# Patient Record
Sex: Female | Born: 1975
Health system: Southern US, Community
[De-identification: ages and names within clinical notes are randomized; demographics above are authoritative.]

## PROBLEM LIST (undated history)

## (undated) DIAGNOSIS — J302 Other seasonal allergic rhinitis: Secondary | ICD-10-CM

## (undated) DIAGNOSIS — Z8614 Personal history of Methicillin resistant Staphylococcus aureus infection: Secondary | ICD-10-CM

## (undated) DIAGNOSIS — C259 Malignant neoplasm of pancreas, unspecified: Secondary | ICD-10-CM

## (undated) DIAGNOSIS — K219 Gastro-esophageal reflux disease without esophagitis: Secondary | ICD-10-CM

## (undated) DIAGNOSIS — F329 Major depressive disorder, single episode, unspecified: Secondary | ICD-10-CM

## (undated) DIAGNOSIS — E119 Type 2 diabetes mellitus without complications: Secondary | ICD-10-CM

## (undated) DIAGNOSIS — F32A Depression, unspecified: Secondary | ICD-10-CM

## (undated) DIAGNOSIS — M1990 Unspecified osteoarthritis, unspecified site: Secondary | ICD-10-CM

## (undated) DIAGNOSIS — C541 Malignant neoplasm of endometrium: Secondary | ICD-10-CM

## (undated) DIAGNOSIS — N200 Calculus of kidney: Secondary | ICD-10-CM

## (undated) DIAGNOSIS — M069 Rheumatoid arthritis, unspecified: Secondary | ICD-10-CM

## (undated) DIAGNOSIS — C180 Malignant neoplasm of cecum: Secondary | ICD-10-CM

## (undated) DIAGNOSIS — C561 Malignant neoplasm of right ovary: Secondary | ICD-10-CM

## (undated) HISTORY — DX: Malignant neoplasm of pancreas, unspecified: C25.9

## (undated) HISTORY — DX: Malignant neoplasm of right ovary: C56.1

## (undated) HISTORY — DX: Malignant neoplasm of cecum: C18.0

## (undated) HISTORY — PX: EYE SURGERY: SHX253

## (undated) HISTORY — DX: Malignant neoplasm of endometrium: C54.1

## (undated) HISTORY — PX: KNEE ARTHROSCOPY: SUR90

---
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
## 1997-06-10 ENCOUNTER — Other Ambulatory Visit: Admission: RE | Admit: 1997-06-10 | Discharge: 1997-06-10 | Payer: Self-pay | Admitting: Obstetrics and Gynecology

## 1998-09-22 ENCOUNTER — Inpatient Hospital Stay (HOSPITAL_COMMUNITY): Admission: AD | Admit: 1998-09-22 | Discharge: 1998-09-24 | Payer: Self-pay | Admitting: Obstetrics and Gynecology

## 1998-10-22 ENCOUNTER — Other Ambulatory Visit: Admission: RE | Admit: 1998-10-22 | Discharge: 1998-10-22 | Payer: Self-pay | Admitting: Family Medicine

## 1999-02-23 DIAGNOSIS — C189 Malignant neoplasm of colon, unspecified: Secondary | ICD-10-CM | POA: Insufficient documentation

## 1999-02-23 DIAGNOSIS — C180 Malignant neoplasm of cecum: Secondary | ICD-10-CM

## 1999-02-23 HISTORY — DX: Malignant neoplasm of cecum: C18.0

## 1999-02-23 HISTORY — PX: PORT A CATH REVISION: SHX6033

## 1999-05-29 ENCOUNTER — Encounter: Payer: Self-pay | Admitting: Gastroenterology

## 1999-05-29 ENCOUNTER — Ambulatory Visit (HOSPITAL_COMMUNITY): Admission: RE | Admit: 1999-05-29 | Discharge: 1999-05-29 | Payer: Self-pay | Admitting: Gastroenterology

## 1999-05-29 ENCOUNTER — Encounter (HOSPITAL_COMMUNITY): Admission: RE | Admit: 1999-05-29 | Discharge: 1999-08-27 | Payer: Self-pay | Admitting: Gastroenterology

## 1999-05-29 ENCOUNTER — Encounter (INDEPENDENT_AMBULATORY_CARE_PROVIDER_SITE_OTHER): Payer: Self-pay | Admitting: Specialist

## 1999-06-03 ENCOUNTER — Inpatient Hospital Stay (HOSPITAL_COMMUNITY): Admission: RE | Admit: 1999-06-03 | Discharge: 1999-06-10 | Payer: Self-pay | Admitting: Surgery

## 1999-08-17 ENCOUNTER — Ambulatory Visit (HOSPITAL_BASED_OUTPATIENT_CLINIC_OR_DEPARTMENT_OTHER): Admission: RE | Admit: 1999-08-17 | Discharge: 1999-08-17 | Payer: Self-pay | Admitting: Surgery

## 1999-08-17 ENCOUNTER — Encounter: Payer: Self-pay | Admitting: Surgery

## 1999-09-14 ENCOUNTER — Encounter: Payer: Self-pay | Admitting: Oncology

## 1999-09-14 ENCOUNTER — Encounter: Admission: RE | Admit: 1999-09-14 | Discharge: 1999-09-14 | Payer: Self-pay | Admitting: Oncology

## 1999-09-15 ENCOUNTER — Inpatient Hospital Stay (HOSPITAL_COMMUNITY): Admission: AD | Admit: 1999-09-15 | Discharge: 1999-09-18 | Payer: Self-pay | Admitting: Oncology

## 1999-09-28 ENCOUNTER — Other Ambulatory Visit: Admission: RE | Admit: 1999-09-28 | Discharge: 1999-09-28 | Payer: Self-pay | Admitting: Obstetrics and Gynecology

## 2000-01-21 ENCOUNTER — Ambulatory Visit (HOSPITAL_BASED_OUTPATIENT_CLINIC_OR_DEPARTMENT_OTHER): Admission: RE | Admit: 2000-01-21 | Discharge: 2000-01-21 | Payer: Self-pay | Admitting: Surgery

## 2000-06-06 ENCOUNTER — Ambulatory Visit (HOSPITAL_COMMUNITY): Admission: RE | Admit: 2000-06-06 | Discharge: 2000-06-06 | Payer: Self-pay | Admitting: Gastroenterology

## 2001-01-16 ENCOUNTER — Ambulatory Visit (HOSPITAL_COMMUNITY): Admission: RE | Admit: 2001-01-16 | Discharge: 2001-01-16 | Payer: Self-pay | Admitting: Oncology

## 2001-01-16 ENCOUNTER — Encounter: Payer: Self-pay | Admitting: Oncology

## 2001-08-21 ENCOUNTER — Other Ambulatory Visit: Admission: RE | Admit: 2001-08-21 | Discharge: 2001-08-21 | Payer: Self-pay | Admitting: Obstetrics and Gynecology

## 2001-09-18 ENCOUNTER — Ambulatory Visit (HOSPITAL_COMMUNITY): Admission: RE | Admit: 2001-09-18 | Discharge: 2001-09-18 | Payer: Self-pay | Admitting: Oncology

## 2001-09-18 ENCOUNTER — Encounter: Payer: Self-pay | Admitting: Oncology

## 2002-04-02 ENCOUNTER — Ambulatory Visit (HOSPITAL_COMMUNITY): Admission: RE | Admit: 2002-04-02 | Discharge: 2002-04-02 | Payer: Self-pay | Admitting: Oncology

## 2002-04-02 ENCOUNTER — Encounter: Payer: Self-pay | Admitting: Oncology

## 2002-05-28 ENCOUNTER — Ambulatory Visit (HOSPITAL_COMMUNITY): Admission: RE | Admit: 2002-05-28 | Discharge: 2002-05-28 | Payer: Self-pay | Admitting: Gastroenterology

## 2002-05-28 ENCOUNTER — Encounter (INDEPENDENT_AMBULATORY_CARE_PROVIDER_SITE_OTHER): Payer: Self-pay

## 2002-07-02 ENCOUNTER — Ambulatory Visit (HOSPITAL_COMMUNITY): Admission: RE | Admit: 2002-07-02 | Discharge: 2002-07-02 | Payer: Self-pay | Admitting: Oncology

## 2002-07-02 ENCOUNTER — Encounter: Payer: Self-pay | Admitting: Oncology

## 2002-09-03 ENCOUNTER — Other Ambulatory Visit: Admission: RE | Admit: 2002-09-03 | Discharge: 2002-09-03 | Payer: Self-pay | Admitting: Obstetrics and Gynecology

## 2002-09-25 ENCOUNTER — Encounter: Payer: Self-pay | Admitting: Oncology

## 2002-09-25 ENCOUNTER — Ambulatory Visit (HOSPITAL_COMMUNITY): Admission: RE | Admit: 2002-09-25 | Discharge: 2002-09-25 | Payer: Self-pay | Admitting: Oncology

## 2003-07-08 ENCOUNTER — Ambulatory Visit (HOSPITAL_COMMUNITY): Admission: RE | Admit: 2003-07-08 | Discharge: 2003-07-08 | Payer: Self-pay | Admitting: Oncology

## 2003-09-23 ENCOUNTER — Other Ambulatory Visit: Admission: RE | Admit: 2003-09-23 | Discharge: 2003-09-23 | Payer: Self-pay | Admitting: Obstetrics and Gynecology

## 2003-12-29 ENCOUNTER — Ambulatory Visit: Payer: Self-pay | Admitting: Oncology

## 2004-01-13 ENCOUNTER — Ambulatory Visit (HOSPITAL_COMMUNITY): Admission: RE | Admit: 2004-01-13 | Discharge: 2004-01-13 | Payer: Self-pay | Admitting: Oncology

## 2004-01-15 ENCOUNTER — Encounter: Admission: RE | Admit: 2004-01-15 | Discharge: 2004-01-15 | Payer: Self-pay | Admitting: Rheumatology

## 2004-01-31 ENCOUNTER — Ambulatory Visit (HOSPITAL_COMMUNITY): Admission: RE | Admit: 2004-01-31 | Discharge: 2004-01-31 | Payer: Self-pay | Admitting: Oncology

## 2004-06-26 ENCOUNTER — Ambulatory Visit: Payer: Self-pay | Admitting: Oncology

## 2004-06-29 ENCOUNTER — Ambulatory Visit (HOSPITAL_COMMUNITY): Admission: RE | Admit: 2004-06-29 | Discharge: 2004-06-29 | Payer: Self-pay | Admitting: Oncology

## 2004-08-31 ENCOUNTER — Ambulatory Visit (HOSPITAL_COMMUNITY): Admission: RE | Admit: 2004-08-31 | Discharge: 2004-08-31 | Payer: Self-pay | Admitting: Gastroenterology

## 2004-09-04 ENCOUNTER — Ambulatory Visit: Payer: Self-pay | Admitting: Oncology

## 2004-10-12 ENCOUNTER — Other Ambulatory Visit: Admission: RE | Admit: 2004-10-12 | Discharge: 2004-10-12 | Payer: Self-pay | Admitting: Obstetrics and Gynecology

## 2005-01-04 ENCOUNTER — Encounter: Admission: RE | Admit: 2005-01-04 | Discharge: 2005-01-04 | Payer: Self-pay | Admitting: Rheumatology

## 2005-06-07 ENCOUNTER — Encounter: Admission: RE | Admit: 2005-06-07 | Discharge: 2005-06-07 | Payer: Self-pay | Admitting: Rheumatology

## 2005-08-30 ENCOUNTER — Ambulatory Visit (HOSPITAL_COMMUNITY): Admission: RE | Admit: 2005-08-30 | Discharge: 2005-08-30 | Payer: Self-pay | Admitting: Oncology

## 2005-09-08 ENCOUNTER — Ambulatory Visit: Payer: Self-pay | Admitting: Oncology

## 2006-01-19 ENCOUNTER — Ambulatory Visit: Payer: Self-pay | Admitting: Oncology

## 2006-01-20 LAB — FECAL OCCULT BLOOD, GUAIAC: Occult Blood: NEGATIVE

## 2007-01-11 ENCOUNTER — Ambulatory Visit: Payer: Self-pay | Admitting: Oncology

## 2007-01-13 LAB — COMPREHENSIVE METABOLIC PANEL
ALT: 10 U/L (ref 0–35)
AST: 9 U/L (ref 0–37)
Albumin: 3.9 g/dL (ref 3.5–5.2)
Alkaline Phosphatase: 49 U/L (ref 39–117)
BUN: 12 mg/dL (ref 6–23)
CO2: 24 mEq/L (ref 19–32)
Calcium: 9 mg/dL (ref 8.4–10.5)
Chloride: 106 mEq/L (ref 96–112)
Creatinine, Ser: 0.67 mg/dL (ref 0.40–1.20)
Glucose, Bld: 97 mg/dL (ref 70–99)
Potassium: 4.1 mEq/L (ref 3.5–5.3)
Sodium: 138 mEq/L (ref 135–145)
Total Bilirubin: 0.2 mg/dL — ABNORMAL LOW (ref 0.3–1.2)
Total Protein: 6.7 g/dL (ref 6.0–8.3)

## 2007-01-13 LAB — CBC WITH DIFFERENTIAL/PLATELET
BASO%: 0.2 % (ref 0.0–2.0)
Basophils Absolute: 0 10*3/uL (ref 0.0–0.1)
EOS%: 1.3 % (ref 0.0–7.0)
Eosinophils Absolute: 0.1 10*3/uL (ref 0.0–0.5)
HCT: 37.1 % (ref 34.8–46.6)
HGB: 12.9 g/dL (ref 11.6–15.9)
LYMPH%: 15.7 % (ref 14.0–48.0)
MCH: 30.8 pg (ref 26.0–34.0)
MCHC: 34.8 g/dL (ref 32.0–36.0)
MCV: 88.7 fL (ref 81.0–101.0)
MONO#: 0.2 10*3/uL (ref 0.1–0.9)
MONO%: 2.7 % (ref 0.0–13.0)
NEUT#: 4.7 10*3/uL (ref 1.5–6.5)
NEUT%: 80.1 % — ABNORMAL HIGH (ref 39.6–76.8)
Platelets: 256 10*3/uL (ref 145–400)
RBC: 4.18 10*6/uL (ref 3.70–5.32)
RDW: 14.4 % (ref 11.3–14.5)
WBC: 5.8 10*3/uL (ref 3.9–10.0)
lymph#: 0.9 10*3/uL (ref 0.9–3.3)

## 2007-01-13 LAB — LACTATE DEHYDROGENASE: LDH: 141 U/L (ref 94–250)

## 2007-01-13 LAB — CEA: CEA: <0.5 ng/mL (ref 0.0–5.0)

## 2007-01-16 ENCOUNTER — Ambulatory Visit (HOSPITAL_COMMUNITY): Admission: RE | Admit: 2007-01-16 | Discharge: 2007-01-16 | Payer: Self-pay | Admitting: Oncology

## 2007-02-20 ENCOUNTER — Ambulatory Visit (HOSPITAL_COMMUNITY): Admission: RE | Admit: 2007-02-20 | Discharge: 2007-02-20 | Payer: Self-pay | Admitting: Oncology

## 2008-01-16 ENCOUNTER — Ambulatory Visit: Payer: Self-pay | Admitting: Oncology

## 2009-04-21 ENCOUNTER — Ambulatory Visit: Payer: Self-pay | Admitting: Oncology

## 2009-05-15 LAB — COMPREHENSIVE METABOLIC PANEL
ALT: 16 U/L (ref 0–35)
AST: 17 U/L (ref 0–37)
Albumin: 4 g/dL (ref 3.5–5.2)
Alkaline Phosphatase: 38 U/L — ABNORMAL LOW (ref 39–117)
BUN: 15 mg/dL (ref 6–23)
CO2: 28 mEq/L (ref 19–32)
Calcium: 8.8 mg/dL (ref 8.4–10.5)
Chloride: 105 mEq/L (ref 96–112)
Creatinine, Ser: 0.75 mg/dL (ref 0.40–1.20)
Glucose, Bld: 84 mg/dL (ref 70–99)
Potassium: 3.9 mEq/L (ref 3.5–5.3)
Sodium: 140 mEq/L (ref 135–145)
Total Bilirubin: 0.4 mg/dL (ref 0.3–1.2)
Total Protein: 6.9 g/dL (ref 6.0–8.3)

## 2009-05-15 LAB — CEA: CEA: 0.5 ng/mL (ref 0.0–5.0)

## 2009-05-15 LAB — CBC WITH DIFFERENTIAL/PLATELET
BASO%: 0.3 % (ref 0.0–2.0)
Basophils Absolute: 0 10*3/uL (ref 0.0–0.1)
EOS%: 1.8 % (ref 0.0–7.0)
Eosinophils Absolute: 0.1 10*3/uL (ref 0.0–0.5)
HCT: 38 % (ref 34.8–46.6)
HGB: 13 g/dL (ref 11.6–15.9)
LYMPH%: 31.5 % (ref 14.0–49.7)
MCH: 31.8 pg (ref 25.1–34.0)
MCHC: 34.3 g/dL (ref 31.5–36.0)
MCV: 92.7 fL (ref 79.5–101.0)
MONO#: 0.5 10*3/uL (ref 0.1–0.9)
MONO%: 7.5 % (ref 0.0–14.0)
NEUT#: 4.2 10*3/uL (ref 1.5–6.5)
NEUT%: 58.9 % (ref 38.4–76.8)
Platelets: 215 10*3/uL (ref 145–400)
RBC: 4.1 10*6/uL (ref 3.70–5.45)
RDW: 13.6 % (ref 11.2–14.5)
WBC: 7.1 10*3/uL (ref 3.9–10.3)
lymph#: 2.2 10*3/uL (ref 0.9–3.3)

## 2009-05-15 LAB — LACTATE DEHYDROGENASE: LDH: 167 U/L (ref 94–250)

## 2009-05-20 LAB — FECAL OCCULT BLOOD, GUAIAC: Occult Blood: NEGATIVE

## 2009-05-22 ENCOUNTER — Ambulatory Visit: Payer: Self-pay | Admitting: Oncology

## 2009-06-02 HISTORY — PX: RIGHT COLECTOMY: SHX853

## 2009-08-22 ENCOUNTER — Ambulatory Visit: Payer: Self-pay | Admitting: Genetic Counselor

## 2010-03-14 ENCOUNTER — Encounter: Payer: Self-pay | Admitting: Rheumatology

## 2010-03-15 ENCOUNTER — Encounter: Payer: Self-pay | Admitting: Rheumatology

## 2010-03-15 ENCOUNTER — Encounter: Payer: Self-pay | Admitting: Oncology

## 2010-07-10 NOTE — Op Note (Signed)
NAME:  Dana Hicks, Dana Hicks              ACCOUNT NO.:  [id number]   MEDICAL RECORD NO.:  [id number]          PATIENT TYPE:  AMB   LOCATION:  ENDO                         FACILITY:  [facility]   PHYSICIAN:  James L. Malon Kindle., M.D.DATE OF BIRTH:  1975/07/28   DATE OF PROCEDURE:  08/31/2004  DATE OF DISCHARGE:                                 OPERATIVE REPORT   PROCEDURE PERFORMED:  Colonoscopy.   ENDOSCOPIST:  Llana Aliment. Edwards, M.D.   MEDICATIONS:  Fentanyl 85 mcg, Versed 8.5 mg IV.   INDICATIONS FOR PROCEDURE:  This is done as a follow-up.  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  She was treated with chemotherapy and this was done as a  follow-up procedure.   DESCRIPTION OF PROCEDURE:  The procedure had been explained to the patient  and consent obtained.  With the patient in the left lateral decubitus  position, digital exam was performed and the pediatric adjustable  colonoscope was inserted.  We advanced easily to the anastomosis. It was  clearly identified.  The anastomosis was normal.  The scope was withdrawn  and the mucosa carefully examined.  No polyps or any other lesions seen  throughout the entire colon.  No diverticular disease.  The scope was  withdrawn.  The patient tolerated the procedure well.   ASSESSMENT:  History of colon cancer with negative colonoscopy at this time,  V10.05.   PLAN:  Will recommend yearly Hemoccults and repeat colonoscopy in five  years.       JLE/MEDQ  D:  08/31/2004  T:  08/31/2004  Job:  [id number]   cc:   Jethro Bastos, M.D.  [address]  [address]  [place]  [place] [address]  Fax: [phone]   Pierce Crane, M.D.  [address] [facility]  [place]  [place] [address]  Fax: [phone]   [address] F[phone]  [place]  [place] [address]  Fax: [phone]   Currie Paris, M.D.  [address], [address]  [place]  [place] [address]

## 2010-07-10 NOTE — Procedures (Signed)
[facility]  Patient:    Dana Hicks, Dana Hicks                       MRN: [id number] Proc. Date: 06/06/00 Attending:  Fayrene Fearing L. Randa Evens, M.D. CC:         Jethro Bastos, M.D.  Currie Paris, M.D.  Pierce Crane, M.D.   Procedure Report  PROCEDURE:  Colonoscopy.  MEDICATIONS:  Fentanyl 75 mcg and Versed 10 mg IV.  INDICATIONS:  A nice 35 year old who one year ago had adenocarcinoma of the cecum and underwent a right hemicolectomy.  She received chemotherapy by Pierce Crane, M.D.  This done as a one-year follow-up.  SCOPE:  Pediatric Olympus video colonoscope.  DESCRIPTION OF PROCEDURE:  The procedure had been explained to the patient and consent obtained.  With the patient in the left lateral decubitus position, the pediatric Olympus video colonoscope was inserted and advanced under direct visualization.  The scope was advanced rapidly and easily to the anastomosis, which was completely normal.  It enter the short bowel for approximately 10 cm and appeared normal.  The scope was withdrawn and the mucosa carefully examined.  The prep was quite good.  There were no polyps or any evidence of recurrent cancer at the anastomosis.  There was no significant diverticular disease throughout the transverse, descending, or sigmoid colon.  No abnormalities were seen in the rectum.  The scope was withdrawn.  The patient tolerated the procedure well.  She was maintained on low-flow oxygen and pulse oximetry throughout the whole procedure with no obvious abnormalities.  ASSESSMENT:  No evidence of recurrent cancer or any further polyps.  PLAN:  Will recommend repeating the procedure in two years.  Routine post colonoscopy instructions. DD:  06/06/00 TD:  06/06/00 Job: [id number]

## 2010-07-10 NOTE — H&P (Signed)
[facility]  Patient:    Dana Hicks, Dana Hicks                       MRN: [id number] Adm. Date:  09/15/99 Attending:  Genene Churn. Cyndie Chime, M.D. CC:         Currie Paris, M.D.             Pierce Crane, M.D.                         History and Physical  HISTORY OF PRESENT ILLNESS:  A pleasant 35 year old woman who presented with mid abdominal pain and blood in her stools in February of this year. Colonoscopy revealed a large right cecal lesion.  She underwent a right colectomy by Dr. Cicero Duck on June 03, 1999.  She was found to have a 4.5 cm moderately differentiated adenocarcinoma with full-thickness penetration through the bowel wall and focal infiltration through the serosa (Stage II T3, N0 moderately differentiated adenocarcinoma).  Twenty-nine lymph nodes were negative.  A preop chest x-ray and CT scan showed no evidence for spread.  I do not see a CEA recorded.  She began a chemotherapy program with 5-fluorouracil 500 mg per sq m given over 1 hour and leucovorin 425 mg per sq m given over 2 hours weekly x 6 with a two-week break starting on June 27.  She received four doses and did well. However, last Wednesday, she started to have problems.  She initially developed retrosternal pressure relieved by antacids.  Then on Friday, she started to develop lower abdominal cramps with intermittent diarrhea.  Cramps became more intense over the weekend, occurring two to three times per hour. She did not see any blood in the bowel movements.  On Sunday evening, she started to run fever as high as 102 degrees.  She was evaluated in our office yesterday.  At time of exam, temperature was 99 degrees.  A white blood count was 3400 with neutrophils 1400.  A chest radiograph was normal.  She was started on intravenous Rocephin and given a dose yesterday and again today. She called this evening due to persistent fevers of over 102 degrees.  She is admitted  at this time for further evaluation.  PAST MEDICAL HISTORY AND MEDICATIONS:  Seasonal allergies for which she takes Nasonex 2 sprays daily, Zyrtec 10 mg daily.  No other medical or surgical illness.  Other medications include Xanax 0.25 mg p.r.n. and Zovia 1/35 one daily.  She did receive a blood transfusion at time of her colon surgery when hemoglobin was a low as 7 g.  FAMILY HISTORY:  No history of malignancy and specifically no colon cancer.  SOCIAL HISTORY:  She is married.  She has two children, age 43 and age 76.  She does not smoke.  Alcohol use is rare.  She works as an Network engineer.  REVIEW OF SYSTEMS:  See HPI.  No cardiorespiratory complaints.  No GU complaints.  She has not been vomiting.  PHYSICAL EXAMINATION:  GENERAL:  A pleasant young woman in no distress.  VITAL SIGNS:  Pulse 102 regular, blood pressure 110/64, respirations 20, temperature 100.4.  She is 5 feet 2 inches tall, 133 pounds.  SKIN, HAIR, AND NAILS:  Normal.  HEENT:  Pupils reactive to light.  Pharynx without erythema or exudate.  NECK:  Supple.  No thyromegaly.  Carotids 2+.  LUNGS:  Clear, resonant to percussion.  HEART:  Regular cardiac rhythm, no murmur.  ABDOMEN: Soft, mildly tender in the left lower quadrant.  RECTAL:  Exam not done in view of neutropenia.  EXTREMITIES:  No edema, no calf tenderness.  NEUROLOGIC:  Grossly normal.  LABORATORY DATA:  Pending.  IMPRESSION: 1. Fever and neutropenia secondary to recent chemotherapy for colon cancer. 2. Chemotherapy-induced colitis. 3. Stage II colon cancer.  PLAN:  Cultures, hydration, broad-spectrum parenteral antibiotics.  Monitor blood counts.  Antidiarrheals p.r.n. DD:  09/15/99 TD:  09/16/99 Job: [id number]

## 2010-07-10 NOTE — Discharge Summary (Signed)
[facility]  Patient:    Dana Hicks, Dana Hicks                       MRN: [id number] Adm. Date:  09/15/99 Disc. Date: 09/18/99 Attending:  Genene Churn. Cyndie Chime, M.D. CC:         Currie Paris, M.D.             Pierce Crane, M.D.                           Discharge Summary  HISTORY OF PRESENT ILLNESS:  [age]-year-old woman diagnosed with a stage II, T3, N0, moderately differentiated adenocarcinoma of the cecum, status post right colectomy June 03, 1999.  She was started on chemotherapy with 5-FU, leucovorin on August 19, 1999.  She received four weekly doses.  She presented at this time with a three- to four-day history of progressive abdominal cramps, then intermittent diarrhea and, finally, high fever.  The fevers persisted despite outpatient parenteral antibiotics started on the day prior to admission.  White blood count fell to 3400 with 1400 neutrophils. She was admitted for further treatment.  PHYSICAL EXAMINATION:  Pulse 102 and regular, blood pressure 110/64, respirations 20, temperature 100.4.  Pertinent findings limited to the abdomen which was soft, distended, mildly tender primarily in the left lower quadrant and also right lower quadrant.  HOSPITAL COURSE:  Impression was chemotherapy-induced colitis and febrile neutropenia.  Cultures were obtained.  Antibiotics were extended to include a more broad-spectrum than the Rocephin she received as an outpatient.  She did have a rapid recovery of her white blood count to over 5000 without use of any growth factor support.  Temperatures resolved rapidly on ceftazidime.  She continued to have intermittent cramps and diarrhea, which slowly subsided by the time of discharge.  She started to be able to take solid and liquid nourishment without much difficulty.  Abdomen remained distended with a clinical mild ileus.  Diarrhea subsided significantly down to two loose movements per day.  Cultures  were negative at the time of discharge.  CONSULTATIONS:  None.  PROCEDURES:  None.  DIAGNOSES: 1. Chemotherapy-induced enterocolitis. 2. Febrile neutropenia. 3. Stage II colon cancer.  CONDITION ON DISCHARGE:  Stable at the time of discharge.  ACTIVITY:  Resume regular activity.  DIET:  Light.  Advance as tolerated.  DISCHARGE INSTRUCTIONS:  I am going to cancel her chemotherapy appointment for September 21, 1999.  Move up her appointment to see Dr. Donnie Coffin to within the next week.  She will probably need dose reductions of her chemotherapy given the significant colitis.  MEDICATIONS: 1. Lomotil 2.5 mg after each watery bowel movement p.r.n., maximum 8 tablets    per 24 hours. 2. Compazine 10 mg q.6h. p.r.n. nausea. DD:  09/19/99 TD:  09/22/99 Job: [id number]

## 2010-11-16 ENCOUNTER — Other Ambulatory Visit: Payer: Self-pay | Admitting: Podiatry

## 2010-11-16 DIAGNOSIS — M79671 Pain in right foot: Secondary | ICD-10-CM

## 2010-11-20 ENCOUNTER — Ambulatory Visit
Admission: RE | Admit: 2010-11-20 | Discharge: 2010-11-20 | Disposition: A | Discharge status: Home / Self Care | Payer: BC Managed Care – PPO | Source: Ambulatory Visit | Attending: Podiatry | Admitting: Podiatry

## 2010-11-20 DIAGNOSIS — M79671 Pain in right foot: Secondary | ICD-10-CM

## 2010-11-20 MED ORDER — GADOBENATE DIMEGLUMINE 529 MG/ML IV SOLN
19.0000 mL | Freq: Once | INTRAVENOUS | Status: AC | PRN
  Administered 2010-11-20: 19 mL via INTRAVENOUS

## 2013-01-24 ENCOUNTER — Other Ambulatory Visit: Payer: Self-pay | Admitting: Obstetrics and Gynecology

## 2014-05-15 ENCOUNTER — Other Ambulatory Visit: Payer: Self-pay | Admitting: Obstetrics and Gynecology

## 2014-05-15 DIAGNOSIS — N644 Mastodynia: Secondary | ICD-10-CM

## 2014-05-16 ENCOUNTER — Other Ambulatory Visit: Payer: Self-pay | Admitting: Obstetrics and Gynecology

## 2014-05-16 ENCOUNTER — Ambulatory Visit
Admission: RE | Admit: 2014-05-16 | Discharge: 2014-05-16 | Disposition: A | Discharge status: Home / Self Care | Payer: BLUE CROSS/BLUE SHIELD | Source: Ambulatory Visit | Attending: Obstetrics and Gynecology | Admitting: Obstetrics and Gynecology

## 2014-05-16 DIAGNOSIS — N6320 Unspecified lump in the left breast, unspecified quadrant: Secondary | ICD-10-CM

## 2014-05-16 DIAGNOSIS — N644 Mastodynia: Secondary | ICD-10-CM

## 2014-05-16 DIAGNOSIS — N6310 Unspecified lump in the right breast, unspecified quadrant: Secondary | ICD-10-CM

## 2014-05-17 ENCOUNTER — Ambulatory Visit
Admission: RE | Admit: 2014-05-17 | Discharge: 2014-05-17 | Disposition: A | Discharge status: Home / Self Care | Payer: BLUE CROSS/BLUE SHIELD | Source: Ambulatory Visit | Attending: Obstetrics and Gynecology | Admitting: Obstetrics and Gynecology

## 2014-05-17 ENCOUNTER — Other Ambulatory Visit: Payer: Self-pay | Admitting: Obstetrics and Gynecology

## 2014-05-17 DIAGNOSIS — N6320 Unspecified lump in the left breast, unspecified quadrant: Secondary | ICD-10-CM

## 2014-06-06 ENCOUNTER — Other Ambulatory Visit: Payer: Self-pay | Admitting: General Surgery

## 2014-06-06 DIAGNOSIS — R928 Other abnormal and inconclusive findings on diagnostic imaging of breast: Secondary | ICD-10-CM

## 2014-06-13 ENCOUNTER — Other Ambulatory Visit: Payer: Self-pay | Admitting: General Surgery

## 2014-06-13 DIAGNOSIS — R928 Other abnormal and inconclusive findings on diagnostic imaging of breast: Secondary | ICD-10-CM

## 2014-06-19 ENCOUNTER — Encounter (HOSPITAL_BASED_OUTPATIENT_CLINIC_OR_DEPARTMENT_OTHER): Payer: Self-pay | Admitting: *Deleted

## 2014-06-19 HISTORY — PX: COLONOSCOPY: SHX174

## 2014-06-19 NOTE — Progress Notes (Signed)
Pt has RA-has been off methotrexate for colonoscopy-no labs needed

## 2014-06-24 ENCOUNTER — Ambulatory Visit
Admission: RE | Admit: 2014-06-24 | Discharge: 2014-06-24 | Disposition: A | Discharge status: Home / Self Care | Payer: BLUE CROSS/BLUE SHIELD | Source: Ambulatory Visit | Attending: General Surgery | Admitting: General Surgery

## 2014-06-24 ENCOUNTER — Other Ambulatory Visit: Payer: Self-pay | Admitting: General Surgery

## 2014-06-24 DIAGNOSIS — R928 Other abnormal and inconclusive findings on diagnostic imaging of breast: Secondary | ICD-10-CM

## 2014-06-25 ENCOUNTER — Ambulatory Visit: Admission: RE | Admit: 2014-06-25 | Payer: BLUE CROSS/BLUE SHIELD | Source: Ambulatory Visit

## 2014-06-25 ENCOUNTER — Ambulatory Visit (HOSPITAL_BASED_OUTPATIENT_CLINIC_OR_DEPARTMENT_OTHER)
Admission: RE | Admit: 2014-06-25 | Payer: BLUE CROSS/BLUE SHIELD | Source: Ambulatory Visit | Admitting: General Surgery

## 2014-06-25 ENCOUNTER — Other Ambulatory Visit: Payer: Self-pay | Admitting: General Surgery

## 2014-06-25 DIAGNOSIS — R928 Other abnormal and inconclusive findings on diagnostic imaging of breast: Secondary | ICD-10-CM

## 2014-06-25 HISTORY — DX: Rheumatoid arthritis, unspecified: M06.9

## 2014-06-25 SURGERY — BREAST LUMPECTOMY WITH RADIOACTIVE SEED LOCALIZATION
Anesthesia: General | Laterality: Left

## 2014-06-28 ENCOUNTER — Ambulatory Visit (HOSPITAL_BASED_OUTPATIENT_CLINIC_OR_DEPARTMENT_OTHER)
Admission: RE | Admit: 2014-06-28 | Discharge: 2014-06-28 | Disposition: A | Discharge status: Home / Self Care | Payer: BLUE CROSS/BLUE SHIELD | Source: Ambulatory Visit | Attending: General Surgery | Admitting: General Surgery

## 2014-06-28 ENCOUNTER — Ambulatory Visit
Admission: RE | Admit: 2014-06-28 | Discharge: 2014-06-28 | Disposition: A | Discharge status: Home / Self Care | Payer: BLUE CROSS/BLUE SHIELD | Source: Ambulatory Visit | Attending: General Surgery | Admitting: General Surgery

## 2014-06-28 ENCOUNTER — Ambulatory Visit (HOSPITAL_BASED_OUTPATIENT_CLINIC_OR_DEPARTMENT_OTHER): Payer: BLUE CROSS/BLUE SHIELD | Admitting: Anesthesiology

## 2014-06-28 ENCOUNTER — Encounter (HOSPITAL_BASED_OUTPATIENT_CLINIC_OR_DEPARTMENT_OTHER): Payer: Self-pay | Admitting: Anesthesiology

## 2014-06-28 ENCOUNTER — Encounter (HOSPITAL_BASED_OUTPATIENT_CLINIC_OR_DEPARTMENT_OTHER)
Admission: RE | Disposition: A | Discharge status: Home / Self Care | Payer: Self-pay | Source: Ambulatory Visit | Attending: General Surgery

## 2014-06-28 DIAGNOSIS — R920 Mammographic microcalcification found on diagnostic imaging of breast: Secondary | ICD-10-CM | POA: Insufficient documentation

## 2014-06-28 DIAGNOSIS — Z87891 Personal history of nicotine dependence: Secondary | ICD-10-CM | POA: Insufficient documentation

## 2014-06-28 DIAGNOSIS — R928 Other abnormal and inconclusive findings on diagnostic imaging of breast: Secondary | ICD-10-CM | POA: Diagnosis present

## 2014-06-28 DIAGNOSIS — M1990 Unspecified osteoarthritis, unspecified site: Secondary | ICD-10-CM | POA: Diagnosis not present

## 2014-06-28 DIAGNOSIS — N6022 Fibroadenosis of left breast: Secondary | ICD-10-CM | POA: Insufficient documentation

## 2014-06-28 DIAGNOSIS — Z9049 Acquired absence of other specified parts of digestive tract: Secondary | ICD-10-CM | POA: Diagnosis not present

## 2014-06-28 DIAGNOSIS — Z86010 Personal history of colonic polyps: Secondary | ICD-10-CM | POA: Diagnosis not present

## 2014-06-28 DIAGNOSIS — N6012 Diffuse cystic mastopathy of left breast: Secondary | ICD-10-CM | POA: Insufficient documentation

## 2014-06-28 DIAGNOSIS — Z85038 Personal history of other malignant neoplasm of large intestine: Secondary | ICD-10-CM | POA: Insufficient documentation

## 2014-06-28 DIAGNOSIS — K219 Gastro-esophageal reflux disease without esophagitis: Secondary | ICD-10-CM | POA: Diagnosis not present

## 2014-06-28 HISTORY — PX: BREAST LUMPECTOMY WITH RADIOACTIVE SEED LOCALIZATION: SHX6424

## 2014-06-28 LAB — POCT HEMOGLOBIN-HEMACUE: Hemoglobin: 12.5 g/dL (ref 12.0–15.0)

## 2014-06-28 SURGERY — BREAST LUMPECTOMY WITH RADIOACTIVE SEED LOCALIZATION
Anesthesia: General | Site: Breast | Laterality: Left

## 2014-06-28 MED ORDER — CHLORHEXIDINE GLUCONATE 4 % EX LIQD: 1.0000 "application " | Freq: Once | CUTANEOUS | Status: DC

## 2014-06-28 MED ORDER — HYDROMORPHONE HCL 1 MG/ML IJ SOLN
INTRAMUSCULAR | Status: AC
  Filled 2014-06-28: qty 1

## 2014-06-28 MED ORDER — ONDANSETRON HCL 4 MG/2ML IJ SOLN: 4.0000 mg | Freq: Once | INTRAMUSCULAR | Status: DC | PRN

## 2014-06-28 MED ORDER — CEFAZOLIN SODIUM-DEXTROSE 2-3 GM-% IV SOLR
2.0000 g | INTRAVENOUS | Status: AC
  Administered 2014-06-28: 2 g via INTRAVENOUS

## 2014-06-28 MED ORDER — MEPERIDINE HCL 25 MG/ML IJ SOLN: 6.2500 mg | INTRAMUSCULAR | Status: DC | PRN

## 2014-06-28 MED ORDER — LACTATED RINGERS IV SOLN
INTRAVENOUS | Status: DC
  Administered 2014-06-28 (×2): via INTRAVENOUS

## 2014-06-28 MED ORDER — FENTANYL CITRATE (PF) 100 MCG/2ML IJ SOLN
INTRAMUSCULAR | Status: DC | PRN
  Administered 2014-06-28: 100 ug via INTRAVENOUS
  Administered 2014-06-28: 25 ug via INTRAVENOUS

## 2014-06-28 MED ORDER — HYDROMORPHONE HCL 1 MG/ML IJ SOLN
0.2500 mg | INTRAMUSCULAR | Status: DC | PRN
  Administered 2014-06-28: 0.5 mg via INTRAVENOUS

## 2014-06-28 MED ORDER — FENTANYL CITRATE (PF) 100 MCG/2ML IJ SOLN: 50.0000 ug | INTRAMUSCULAR | Status: DC | PRN

## 2014-06-28 MED ORDER — BUPIVACAINE-EPINEPHRINE (PF) 0.25% -1:200000 IJ SOLN
INTRAMUSCULAR | Status: DC | PRN
  Administered 2014-06-28: 30 mL

## 2014-06-28 MED ORDER — GLYCOPYRROLATE 0.2 MG/ML IJ SOLN: 0.2000 mg | Freq: Once | INTRAMUSCULAR | Status: DC | PRN

## 2014-06-28 MED ORDER — ONDANSETRON HCL 4 MG/2ML IJ SOLN
INTRAMUSCULAR | Status: DC | PRN
  Administered 2014-06-28: 4 mg via INTRAVENOUS

## 2014-06-28 MED ORDER — MIDAZOLAM HCL 2 MG/2ML IJ SOLN
INTRAMUSCULAR | Status: AC
  Filled 2014-06-28: qty 2

## 2014-06-28 MED ORDER — PROPOFOL 10 MG/ML IV BOLUS
INTRAVENOUS | Status: DC | PRN
  Administered 2014-06-28: 200 mg via INTRAVENOUS

## 2014-06-28 MED ORDER — DEXAMETHASONE SODIUM PHOSPHATE 4 MG/ML IJ SOLN
INTRAMUSCULAR | Status: DC | PRN
  Administered 2014-06-28: 10 mg via INTRAVENOUS

## 2014-06-28 MED ORDER — BUPIVACAINE-EPINEPHRINE (PF) 0.25% -1:200000 IJ SOLN
INTRAMUSCULAR | Status: AC
  Filled 2014-06-28: qty 30

## 2014-06-28 MED ORDER — CEFAZOLIN SODIUM-DEXTROSE 2-3 GM-% IV SOLR
INTRAVENOUS | Status: AC
  Filled 2014-06-28: qty 50

## 2014-06-28 MED ORDER — LIDOCAINE HCL (CARDIAC) 20 MG/ML IV SOLN
INTRAVENOUS | Status: DC | PRN
  Administered 2014-06-28: 60 mg via INTRAVENOUS

## 2014-06-28 MED ORDER — HYDROCODONE-ACETAMINOPHEN 5-325 MG PO TABS: 1.0000 | ORAL_TABLET | ORAL | Status: DC | PRN

## 2014-06-28 MED ORDER — FENTANYL CITRATE (PF) 100 MCG/2ML IJ SOLN
INTRAMUSCULAR | Status: AC
  Filled 2014-06-28: qty 4

## 2014-06-28 MED ORDER — MIDAZOLAM HCL 2 MG/2ML IJ SOLN
1.0000 mg | INTRAMUSCULAR | Status: DC | PRN
  Administered 2014-06-28: 2 mg via INTRAVENOUS

## 2014-06-28 SURGICAL SUPPLY — 46 items
APPLIER CLIP 9.375 MED OPEN (MISCELLANEOUS)
APR CLP MED 9.3 20 MLT OPN (MISCELLANEOUS)
BINDER BREAST LRG (GAUZE/BANDAGES/DRESSINGS) IMPLANT
BINDER BREAST MEDIUM (GAUZE/BANDAGES/DRESSINGS) IMPLANT
BINDER BREAST XLRG (GAUZE/BANDAGES/DRESSINGS) IMPLANT
BINDER BREAST XXLRG (GAUZE/BANDAGES/DRESSINGS) IMPLANT
BLADE SURG 15 STRL LF DISP TIS (BLADE) ×1 IMPLANT
BLADE SURG 15 STRL SS (BLADE) ×1
CANISTER SUC SOCK COL 7IN (MISCELLANEOUS) IMPLANT
CANISTER SUCT 1200ML W/VALVE (MISCELLANEOUS) ×2 IMPLANT
CHLORAPREP W/TINT 26ML (MISCELLANEOUS) ×2 IMPLANT
CLIP APPLIE 9.375 MED OPEN (MISCELLANEOUS) IMPLANT
COVER BACK TABLE 60X90IN (DRAPES) ×2 IMPLANT
COVER MAYO STAND STRL (DRAPES) ×2 IMPLANT
COVER PROBE W GEL 5X96 (DRAPES) ×2 IMPLANT
DECANTER SPIKE VIAL GLASS SM (MISCELLANEOUS) IMPLANT
DEVICE DUBIN W/COMP PLATE 8390 (MISCELLANEOUS) ×2 IMPLANT
DRAPE LAPAROSCOPIC ABDOMINAL (DRAPES) ×2 IMPLANT
DRAPE UTILITY XL STRL (DRAPES) ×2 IMPLANT
ELECT COATED BLADE 2.86 ST (ELECTRODE) ×2 IMPLANT
ELECT REM PT RETURN 9FT ADLT (ELECTROSURGICAL) ×2
ELECTRODE REM PT RTRN 9FT ADLT (ELECTROSURGICAL) ×1 IMPLANT
GLOVE BIOGEL PI IND STRL 8 (GLOVE) ×2 IMPLANT
GLOVE BIOGEL PI INDICATOR 8 (GLOVE) ×2
GLOVE ECLIPSE 7.5 STRL STRAW (GLOVE) ×2 IMPLANT
GLOVE SURG SS PI 7.5 STRL IVOR (GLOVE) ×2 IMPLANT
GOWN STRL REUS W/ TWL LRG LVL3 (GOWN DISPOSABLE) IMPLANT
GOWN STRL REUS W/ TWL XL LVL3 (GOWN DISPOSABLE) ×2 IMPLANT
GOWN STRL REUS W/TWL LRG LVL3 (GOWN DISPOSABLE)
GOWN STRL REUS W/TWL XL LVL3 (GOWN DISPOSABLE) ×2
KIT MARKER MARGIN INK (KITS) ×2 IMPLANT
LIQUID BAND (GAUZE/BANDAGES/DRESSINGS) ×2 IMPLANT
NEEDLE HYPO 25X1 1.5 SAFETY (NEEDLE) ×2 IMPLANT
NS IRRIG 1000ML POUR BTL (IV SOLUTION) ×2 IMPLANT
PACK BASIN DAY SURGERY FS (CUSTOM PROCEDURE TRAY) ×2 IMPLANT
PENCIL BUTTON HOLSTER BLD 10FT (ELECTRODE) ×2 IMPLANT
SLEEVE SCD COMPRESS KNEE MED (MISCELLANEOUS) ×2 IMPLANT
SPONGE LAP 4X18 X RAY DECT (DISPOSABLE) ×2 IMPLANT
SUT MNCRL AB 4-0 PS2 18 (SUTURE) ×2 IMPLANT
SUT SILK 2 0 SH (SUTURE) IMPLANT
SUT VICRYL 3-0 CR8 SH (SUTURE) ×2 IMPLANT
SYR CONTROL 10ML LL (SYRINGE) ×2 IMPLANT
TOWEL OR 17X24 6PK STRL BLUE (TOWEL DISPOSABLE) ×2 IMPLANT
TOWEL OR NON WOVEN STRL DISP B (DISPOSABLE) ×2 IMPLANT
TUBE CONNECTING 20X1/4 (TUBING) ×2 IMPLANT
YANKAUER SUCT BULB TIP NO VENT (SUCTIONS) ×2 IMPLANT

## 2014-06-28 NOTE — Discharge Instructions (Signed)
[facility] Office Phone Number [phone]  BREAST BIOPSY/ PARTIAL MASTECTOMY: POST OP INSTRUCTIONS  Always review your discharge instruction sheet given to you by the facility where your surgery was performed.  IF YOU HAVE DISABILITY OR FAMILY LEAVE FORMS, YOU MUST BRING THEM TO THE OFFICE FOR PROCESSING.  DO NOT GIVE THEM TO YOUR DOCTOR.  1. A prescription for pain medication may be given to you upon discharge.  Take your pain medication as prescribed, if needed.  If narcotic pain medicine is not needed, then you may take acetaminophen (Tylenol) or ibuprofen (Advil) as needed. 2. Take your usually prescribed medications unless otherwise directed 3. If you need a refill on your pain medication, please contact your pharmacy.  They will contact our office to request authorization.  Prescriptions will not be filled after 5pm or on week-ends. 4. You should eat very light the first 24 hours after surgery, such as soup, crackers, pudding, etc.  Resume your normal diet the day after surgery. 5. Most patients will experience some swelling and bruising in the breast.  Ice packs and a good support bra will help.  Swelling and bruising can take several days to resolve.  6. It is common to experience some constipation if taking pain medication after surgery.  Increasing fluid intake and taking a stool softener will usually help or prevent this problem from occurring.  A mild laxative (Milk of Magnesia or Miralax) should be taken according to package directions if there are no bowel movements after 48 hours. 7. Unless discharge instructions indicate otherwise, you may remove your bandages 24-48 hours after surgery, and you may shower at that time.  You may have steri-strips (small skin tapes) in place directly over the incision.  These strips should be left on the skin for 7-10 days.  If your surgeon used skin glue on the incision, you may shower in 24 hours.  The glue will flake off over the  next 2-3 weeks.  Any sutures or staples will be removed at the office during your follow-up visit. 8. ACTIVITIES:  You may resume regular daily activities (gradually increasing) beginning the next day.  Wearing a good support bra or sports bra minimizes pain and swelling.  You may have sexual intercourse when it is comfortable. a. You may drive when you no longer are taking prescription pain medication, you can comfortably wear a seatbelt, and you can safely maneuver your car and apply brakes. b. RETURN TO WORK:  ______________________________________________________________________________________ 9. You should see your doctor in the office for a follow-up appointment approximately two weeks after your surgery.  Your doctors nurse will typically make your follow-up appointment when she calls you with your pathology report.  Expect your pathology report 2-3 business days after your surgery.  You may call to check if you do not hear from [place] after three days. 10. OTHER INSTRUCTIONS: _______________________________________________________________________________________________ _____________________________________________________________________________________________________________________________________ _____________________________________________________________________________________________________________________________________ _____________________________________________________________________________________________________________________________________  WHEN TO CALL YOUR DOCTOR: 1. Fever over 101.0 2. Nausea and/or vomiting. 3. Extreme swelling or bruising. 4. Continued bleeding from incision. 5. Increased pain, redness, or drainage from the incision.  The clinic staff is available to answer your questions during regular business hours.  Please dont hesitate to call and ask to speak to one of the nurses for clinical concerns.  If you have a medical emergency, go to the nearest  emergency room or call 911.  A surgeon from [facility] is always on call at the hospital.  For further questions, please visit [email or web address] °Office Phone Number [phone] ° °BREAST BIOPSY/ PARTIAL MASTECTOMY: POST OP INSTRUCTIONS ° °Always review your discharge instruction sheet given to you by the facility where your surgery was performed. ° °IF YOU HAVE DISABILITY OR FAMILY LEAVE FORMS, YOU MUST BRING THEM TO THE OFFICE FOR PROCESSING.  DO NOT GIVE THEM TO YOUR DOCTOR. ° °11. A prescription for pain medication may be given to you upon discharge.  Take your pain medication as prescribed, if needed.  If narcotic pain medicine is not needed, then you may take acetaminophen (Tylenol) or ibuprofen (Advil) as needed. °12. Take your usually prescribed medications unless otherwise directed °13. If you need a refill on your pain medication, please contact your pharmacy.  They will contact our office to request authorization.  Prescriptions will not be filled after 5pm or on week-ends. °14. You should eat very light the first 24 hours after surgery, such as soup, crackers, pudding, etc.  Resume your normal diet the day after surgery. °15. Most patients will experience some swelling and bruising in the breast.  Ice packs and a good support bra will help.  Swelling and bruising can take several days to resolve.  °16. It is common to experience some constipation if taking pain medication after surgery.  Increasing fluid intake and taking a stool softener will usually help or prevent this problem from occurring.  A mild laxative (Milk of Magnesia or Miralax) should be taken according to package directions if there are no bowel movements after 48 hours. °17. Unless discharge instructions indicate otherwise, you may remove your bandages 24-48 hours after surgery, and you may shower at that time.  You may have steri-strips (small skin tapes) in place directly over the  incision.  These strips should be left on the skin for 7-10 days.  If your surgeon used skin glue on the incision, you may shower in 24 hours.  The glue will flake off over the next 2-3 weeks.  Any sutures or staples will be removed at the office during your follow-up visit. °18. ACTIVITIES:  You may resume regular daily activities (gradually increasing) beginning the next day.  Wearing a good support bra or sports bra minimizes pain and swelling.  You may have sexual intercourse when it is comfortable. °a. You may drive when you no longer are taking prescription pain medication, you can comfortably wear a seatbelt, and you can safely maneuver your car and apply brakes. °b. RETURN TO WORK:  ______________________________________________________________________________________ °19. You should see your doctor in the office for a follow-up appointment approximately two weeks after your surgery.  Your doctor’s nurse will typically make your follow-up appointment when she calls you with your pathology report.  Expect your pathology report 2-3 business days after your surgery.  You may call to check if you do not hear from us after three days. °20. OTHER INSTRUCTIONS: _______________________________________________________________________________________________ _____________________________________________________________________________________________________________________________________ °_____________________________________________________________________________________________________________________________________ °_____________________________________________________________________________________________________________________________________ ° °WHEN TO CALL YOUR DOCTOR: °6. Fever over 101.0 °7. Nausea and/or vomiting. °8. Extreme swelling or bruising. °9. Continued bleeding from incision. °10. Increased pain, redness, or drainage from the incision. ° °The clinic staff is available to answer your questions  during regular business hours.  Please don’t hesitate to call and ask to speak to one of the nurses for clinical concerns.  If you have a medical emergency, go to the nearest emergency room or call 911.  A surgeon from [facility] is always on call at the hospital. ° °For further questions, please visit [email or web address]  ° ° °  Post Anesthesia Home Care Instructions ° °Activity: °Get plenty of rest for the remainder of the day. A responsible adult should stay with you for 24 hours following the procedure.  °For the next 24 hours, DO NOT: °-Drive a car °-Operate machinery °-Drink alcoholic beverages °-Take any medication unless instructed by your physician °-Make any legal decisions or sign important papers. ° °Meals: °Start with liquid foods such as gelatin or soup. Progress to regular foods as tolerated. Avoid greasy, spicy, heavy foods. If nausea and/or vomiting occur, drink only clear liquids until the nausea and/or vomiting subsides. Call your physician if vomiting continues. ° °Special Instructions/Symptoms: °Your throat may feel dry or sore from the anesthesia or the breathing tube placed in your throat during surgery. If this causes discomfort, gargle with warm salt water. The discomfort should disappear within 24 hours. ° °If you had a scopolamine patch placed behind your ear for the management of post- operative nausea and/or vomiting: ° °1. The medication in the patch is effective for 72 hours, after which it should be removed.  Wrap patch in a tissue and discard in the trash. Wash hands thoroughly with soap and water. °2. You may remove the patch earlier than 72 hours if you experience unpleasant side effects which may include dry mouth, dizziness or visual disturbances. °3. Avoid touching the patch. Wash your hands with soap and water after contact with the patch. °  ° °

## 2014-06-28 NOTE — Op Note (Signed)
Preoperative Diagnosis: abnormal mamogram left  Postoprative Diagnosis: abnormal mamogram left  Procedure: Procedure(s): RADIOACTIVE SEED LOCALIZATION LEFT BREAST LUMPECTOMY   Surgeon: Excell Seltzer T   Assistants: none  Anesthesia:  General LMA anesthesia  Indications: patient recently had an abnormal left mammogram with a new subtle 1 cm area of distortion laterally in the left breast. Initial large core needle biopsy revealed benign breast tissue. This was felt to be discordant and excisional biopsy recommended. At the time of her seed placement it was felt that the original biopsy clip was not within the lesion and a second core biopsy was performed. This has revealed a radial scar. With this finding we have recommended still proceeding with excision of the lesion to confirm the diagnosis and rule out underlying malignancy. The procedure and indications risks have been discussed in detailed extensively elsewhere    Procedure Detail:  Following accurate preoperative placement of radioactive seed in the left breast the patient is seen in the holding area and the seed placement was confirmed with the neoprobe. She was taken operating room, placed in supine position on the operating table, and laryngeal mask general anesthesia induced. She received preoperative IV antibiotics. PAS were in place. The left breast was widely sterilely prepped and draped. Patient timeout was performed to correct procedure verified. The neoprobe was used to pinpoint the area of highest counts on the skin and a curvilinear incision was made here and the lateral breast and dissection carried down through the subtenon's tissue. The breast capsule. As the seed was near the dissection was widened to excise a globular portion of breast tissue about 2 cm in diameter around the seed. This was inked for margins. Specimen mammography showed the seed centrally located in the specimen and the second biopsy clip somewhat more  inferiorly in the specimen with the original biopsy clip which was placed in benign breast tissue not visible. I did excise an additional half centimeter inferior margin was oriented. Specimen mammography did not show the original clip in the specimen but as this was displaced and in benign tissue was not a concern. Hemostasis was obtained with cautery. The soft tissue was infiltrated with Marcaine. The deep breast subtenon's tissue was closed with interrupted 3-0 Vicryl and the skin with subcuticular 5-0 Monocryl and Dermabond. Sponge needle and instrument counts were correct.    Findings: As above  Estimated Blood Loss:  Minimal         Drains: none  Blood Given: none          Specimens: #1 left breast lumpectomy     #2 further inferior margin        Complications:  * No complications entered in OR log *         Disposition: PACU - hemodynamically stable.         Condition: stable

## 2014-06-28 NOTE — Anesthesia Preprocedure Evaluation (Signed)
Anesthesia Evaluation  Patient identified by MRN, date of birth, ID band Patient awake    Reviewed: Allergy & Precautions, NPO status , Patient's Chart, lab work & pertinent test results  Airway Mallampati: I  TM Distance: >3 FB Neck ROM: Full    Dental   Pulmonary    Pulmonary exam normal        Cardiovascular Normal cardiovascular exam     Neuro/Psych    GI/Hepatic   Endo/Other    Renal/GU      Musculoskeletal   Abdominal   Peds  Hematology   Anesthesia Other Findings   Reproductive/Obstetrics                             Anesthesia Physical Anesthesia Plan  ASA: II  Anesthesia Plan: General   Post-op Pain Management:    Induction: Intravenous  Airway Management Planned: LMA  Additional Equipment:   Intra-op Plan:   Post-operative Plan: Extubation in OR  Informed Consent: I have reviewed the patients History and Physical, chart, labs and discussed the procedure including the risks, benefits and alternatives for the proposed anesthesia with the patient or authorized representative who has indicated his/her understanding and acceptance.     Plan Discussed with: CRNA and Surgeon  Anesthesia Plan Comments:         Anesthesia Quick Evaluation  

## 2014-06-28 NOTE — Anesthesia Procedure Notes (Signed)
Procedure Name: LMA Insertion Date/Time: 06/28/2014 12:45 PM Performed by: Maryella Shivers Pre-anesthesia Checklist: Patient identified, Emergency Drugs available, Suction available and Patient being monitored Patient Re-evaluated:Patient Re-evaluated prior to inductionOxygen Delivery Method: Circle System Utilized Preoxygenation: Pre-oxygenation with 100% oxygen Intubation Type: IV induction Ventilation: Mask ventilation without difficulty LMA: LMA inserted LMA Size: 4.0 Number of attempts: 1 Airway Equipment and Method: Bite block Placement Confirmation: positive ETCO2 Tube secured with: Tape Dental Injury: Teeth and Oropharynx as per pre-operative assessment

## 2014-06-28 NOTE — H&P (Signed)
History of Present Illness Dana Kitchen T. Dana Swopes Hicks; 06/06/2014 11:34 AM) Patient words: breast.  The patient is a 39 year old female who presents with a breast mass. She recently developed some discomfort in the medial aspect of her left breast. It had been about 1 year since her screening mammogram and Dana Hicks referred her to the breast center for evaluation. She has no personal history of breast disease. At that time diagnostic bilateral mammogram revealed a new area of architectural distortion in the outer left breast spanning approximately 16 mm that was felt to be suspicious for a sclerosing lesion or malignancy. Ultrasound revealed a subtle area of hypo-echogenicity and distortion at the 2:30 to 3 o'clock position 6 cm from the nipple with some posterior shadowing and large core needle biopsy was recommended and performed under ultrasound guidance. Postoperative mammogram shows the marking clip within the lesion. Pathology returned showing fibrocystic changes without atypia or malignancy. This was felt to be possibly discordant by Dana Hicks and the patient is referred for consideration for excisional biopsy. She has not felt any lump. No nipple discharge or skin changes. The discomfort she was having in her medial left breast has resolved.   Other Problems Dana Hicks, Hicks; 06/06/2014 11:04 AM) Arthritis Back Pain Colon Cancer Gastroesophageal Reflux Disease Other disease, cancer, significant illness  Past Surgical History Dana Hicks, Hicks; 06/06/2014 11:04 AM) Breast Biopsy Left. Colon Polyp Removal - Open Colon Removal - Partial Oral Surgery  Diagnostic Studies History Dana Hicks, Hicks; 06/06/2014 11:04 AM) Colonoscopy 1-5 years ago Mammogram within last year Pap Smear 1-5 years ago  Allergies Dana Hicks, Hicks; 06/06/2014 11:04 AM) No Known Drug Allergies04/14/2016  Medication History Dana Hicks, Hicks; 06/06/2014 11:05 AM) Amoxicillin-Pot  Clavulanate (875-125MG Tablet, Oral) Active. BD TB Syringe (27G X 1/2"1 ML Misc,) Active. Fluconazole (150MG Tablet, Oral) Active. Folic Acid (1MG Tablet, Oral) Active. Humira Pen (40MG/0.8ML Pen-inj Kit, Subcutaneous) Active. Methocarbamol (500MG Tablet, Oral) Active. Methotrexate Sodium (PF) (250MG/10ML Solution, Injection) Active. Promethazine-DM (6.25-15MG/5ML Syrup, Oral) Active. Sulfamethoxazole-Trimethoprim (800-160MG Tablet, Oral) Active. Voltaren (1% Gel, Transdermal) Active. Medications Reconciled  Social History Dana Hicks; 06/06/2014 11:04 AM) Alcohol use Occasional alcohol use. Caffeine use Carbonated beverages. No drug use Tobacco use Former smoker.  Family History Dana Hicks; 06/06/2014 11:04 AM) Arthritis Mother. Cervical Cancer Mother. Colon Cancer Family Members In General. Diabetes Mellitus Family Members In General. Heart Disease Father. Heart disease in female family member before age 78  Pregnancy / Birth History Dana Hicks, Dana Hicks; 06/06/2014 11:04 AM) Age at menarche 77 years. Contraceptive History Oral contraceptives. Gravida 2 Irregular periods Maternal age 29-25 Para 2  Review of Systems Dana Hicks; 06/06/2014 11:04 AM) General Not Present- Appetite Loss, Chills, Fatigue, Fever, Night Sweats, Weight Gain and Weight Loss. Skin Not Present- Change in Wart/Mole, Dryness, Hives, Jaundice, New Lesions, Non-Healing Wounds, Rash and Ulcer. HEENT Present- Seasonal Allergies. Not Present- Earache, Hearing Loss, Hoarseness, Nose Bleed, Oral Ulcers, Ringing in the Ears, Sinus Pain, Sore Throat, Visual Disturbances, Wears glasses/contact lenses and Yellow Eyes. Respiratory Not Present- Bloody sputum, Chronic Cough, Difficulty Breathing, Snoring and Wheezing. Breast Not Present- Breast Mass, Breast Pain, Nipple Discharge and Skin Changes. Cardiovascular Present- Shortness of Breath. Not Present- Chest Pain, Difficulty  Breathing Lying Down, Leg Cramps, Palpitations, Rapid Heart Rate and Swelling of Extremities. Gastrointestinal Not Present- Abdominal Pain, Bloating, Bloody Stool, Change in Bowel Habits, Chronic diarrhea, Constipation, Difficulty Swallowing, Excessive gas, Gets full quickly at meals, Hemorrhoids, Indigestion, Nausea, Rectal Pain and Vomiting.  Female Genitourinary Not Present- Frequency, Nocturia, Painful Urination, Pelvic Pain and Urgency. Musculoskeletal Not Present- Back Pain, Joint Pain, Joint Stiffness, Muscle Pain, Muscle Weakness and Swelling of Extremities. Neurological Not Present- Decreased Memory, Fainting, Headaches, Numbness, Seizures, Tingling, Tremor, Trouble walking and Weakness. Psychiatric Not Present- Anxiety, Bipolar, Change in Sleep Pattern, Depression, Fearful and Frequent crying. Endocrine Not Present- Cold Intolerance, Excessive Hunger, Hair Changes, Heat Intolerance, Hot flashes and New Diabetes. Hematology Present- Easy Bruising. Not Present- Excessive bleeding, Gland problems, HIV and Persistent Infections.   Vitals Dana Hicks; 06/06/2014 11:05 AM) 06/06/2014 11:05 AM Weight: 208 lb Height: 62in Body Surface Area: 2.03 m Body Mass Index: 38.04 kg/m Temp.: 98.42F(Oral)  Pulse: 84 (Regular)  Resp.: 17 (Unlabored)  BP: 130/68 (Sitting, Left Arm, Standard)    Physical Exam Dana Kitchen T. Dana Hicks; 06/06/2014 11:35 AM) The physical exam findings are as follows: Note:General: Mildly overweight Caucasian female in no distress Skin: No rash or infection Lymph nodes: No cervical, supra clavicular or axillary nodes palpable Breasts: No palpable masses in either breast. No skin changes. No nipple discharge Lungs: Clear equal breath sounds bilaterally Neurologic: Alert and fully oriented. Gait normal.    Assessment & Plan Dana Hicks; 06/06/2014 11:36 AM) ABNORMAL MAMMOGRAM OF LEFT BREAST (793.80  R92.8) Impression: Suspicious  appearing mammogram with 16 mm area of architectural distortion in the lateral left breast with ultrasound biopsy showing only fibrocystic change. This is felt to be discordant by her breast radiologist. I discussed these findings with the patient. I discussed options of close imaging follow-up versus excisional biopsy to rule out malignancy. I would lean toward biopsy in this situation and that is what the patient prefers. We discussed the indications and nature of the surgery, expected recovery, risks of anesthetic complications, bleeding and infection. Current Plans  Schedule for Surgery Radioactive seed localized left breast lumpectomy

## 2014-06-28 NOTE — Transfer of Care (Signed)
Immediate Anesthesia Transfer of Care Note  Patient: Dana Hicks  Procedure(s) Performed: Procedure(s): RADIOACTIVE SEED LOCALIZATION LEFT BREAST LUMPECTOMY (Left)  Patient Location: PACU  Anesthesia Type:General  Level of Consciousness: awake, alert  and oriented  Airway & Oxygen Therapy: Patient Spontanous Breathing and Patient connected to face mask oxygen  Post-op Assessment: Report given to RN and Post -op Vital signs reviewed and stable  Post vital signs: Reviewed and stable  Last Vitals:  Filed Vitals:   06/28/14 1112  BP: 136/78  Pulse: 54  Temp: 36.8 C  Resp: 18    Complications: No apparent anesthesia complications

## 2014-06-28 NOTE — Anesthesia Postprocedure Evaluation (Signed)
Anesthesia Post Note  Patient: Dana Hicks  Procedure(s) Performed: Procedure(s) (LRB): RADIOACTIVE SEED LOCALIZATION LEFT BREAST LUMPECTOMY (Left)  Anesthesia type: general  Patient location: PACU  Post pain: Pain level controlled  Post assessment: Patient's Cardiovascular Status Stable  Last Vitals:  Filed Vitals:   06/28/14 1400  BP: 124/73  Pulse: 62  Temp:   Resp: 14    Post vital signs: Reviewed and stable  Level of consciousness: awake  Complications: No apparent anesthesia complications

## 2014-06-28 NOTE — Interval H&P Note (Signed)
History and Physical Interval Note:  06/28/2014 12:34 PM  Dana Hicks  has presented today for surgery, with the diagnosis of abnormal mamogram left  The various methods of treatment have been discussed with the patient and family. After consideration of risks, benefits and other options for treatment, the patient has consented to  Procedure(s): RADIOACTIVE SEED LOCALIZATION LEFT BREAST LUMPECTOMY (Left) as a surgical intervention .  The patient's history has been reviewed, patient examined, no change in status, stable for surgery.  I have reviewed the patient's chart and labs.  Questions were answered to the patient's satisfaction.     Zorana Brockwell T

## 2014-07-02 ENCOUNTER — Encounter (HOSPITAL_BASED_OUTPATIENT_CLINIC_OR_DEPARTMENT_OTHER): Payer: Self-pay | Admitting: General Surgery

## 2014-07-31 ENCOUNTER — Other Ambulatory Visit: Payer: Self-pay | Admitting: Obstetrics and Gynecology

## 2014-08-01 LAB — CYTOLOGY - PAP

## 2015-08-07 DIAGNOSIS — Z79899 Other long term (current) drug therapy: Secondary | ICD-10-CM | POA: Diagnosis not present

## 2015-08-07 DIAGNOSIS — M0609 Rheumatoid arthritis without rheumatoid factor, multiple sites: Secondary | ICD-10-CM | POA: Diagnosis not present

## 2015-08-07 DIAGNOSIS — Z9225 Personal history of immunosupression therapy: Secondary | ICD-10-CM | POA: Diagnosis not present

## 2015-09-08 DIAGNOSIS — J069 Acute upper respiratory infection, unspecified: Secondary | ICD-10-CM | POA: Diagnosis not present

## 2015-09-12 DIAGNOSIS — M0579 Rheumatoid arthritis with rheumatoid factor of multiple sites without organ or systems involvement: Secondary | ICD-10-CM | POA: Diagnosis not present

## 2015-09-12 DIAGNOSIS — M170 Bilateral primary osteoarthritis of knee: Secondary | ICD-10-CM | POA: Diagnosis not present

## 2015-09-12 DIAGNOSIS — Z09 Encounter for follow-up examination after completed treatment for conditions other than malignant neoplasm: Secondary | ICD-10-CM | POA: Diagnosis not present

## 2015-09-29 DIAGNOSIS — M1711 Unilateral primary osteoarthritis, right knee: Secondary | ICD-10-CM | POA: Diagnosis not present

## 2015-11-17 DIAGNOSIS — J209 Acute bronchitis, unspecified: Secondary | ICD-10-CM | POA: Diagnosis not present

## 2015-11-20 ENCOUNTER — Other Ambulatory Visit: Payer: Self-pay | Admitting: Obstetrics and Gynecology

## 2015-11-20 DIAGNOSIS — Z13220 Encounter for screening for lipoid disorders: Secondary | ICD-10-CM | POA: Diagnosis not present

## 2015-11-20 DIAGNOSIS — Z6841 Body Mass Index (BMI) 40.0 and over, adult: Secondary | ICD-10-CM | POA: Diagnosis not present

## 2015-11-20 DIAGNOSIS — Z124 Encounter for screening for malignant neoplasm of cervix: Secondary | ICD-10-CM | POA: Diagnosis not present

## 2015-11-20 DIAGNOSIS — Z01419 Encounter for gynecological examination (general) (routine) without abnormal findings: Secondary | ICD-10-CM | POA: Diagnosis not present

## 2015-11-20 DIAGNOSIS — Z1231 Encounter for screening mammogram for malignant neoplasm of breast: Secondary | ICD-10-CM | POA: Diagnosis not present

## 2015-11-24 LAB — CYTOLOGY - PAP

## 2015-12-31 ENCOUNTER — Other Ambulatory Visit: Payer: Self-pay | Admitting: Rheumatology

## 2015-12-31 ENCOUNTER — Telehealth: Payer: Self-pay | Admitting: Radiology

## 2015-12-31 DIAGNOSIS — Z79899 Other long term (current) drug therapy: Secondary | ICD-10-CM

## 2015-12-31 NOTE — Telephone Encounter (Signed)
Labs due for refill of Humira will call patient

## 2016-01-01 NOTE — Telephone Encounter (Signed)
Placed orders TB neg 6/201/7 Advised patient to go , she states she will go

## 2016-01-06 ENCOUNTER — Other Ambulatory Visit: Payer: Self-pay | Admitting: Radiology

## 2016-01-13 ENCOUNTER — Other Ambulatory Visit: Payer: Self-pay | Admitting: Radiology

## 2016-01-13 DIAGNOSIS — Z79899 Other long term (current) drug therapy: Secondary | ICD-10-CM

## 2016-01-13 LAB — CBC WITH DIFFERENTIAL/PLATELET
Basophils Absolute: 0 cells/uL (ref 0–200)
Basophils Relative: 0 %
Eosinophils Absolute: 65 cells/uL (ref 15–500)
Eosinophils Relative: 1 %
HCT: 42.2 % (ref 35.0–45.0)
Hemoglobin: 13.4 g/dL (ref 11.7–15.5)
Lymphocytes Relative: 34 %
Lymphs Abs: 2210 cells/uL (ref 850–3900)
MCH: 31.1 pg (ref 27.0–33.0)
MCHC: 31.8 g/dL — ABNORMAL LOW (ref 32.0–36.0)
MCV: 97.9 fL (ref 80.0–100.0)
MPV: 11.1 fL (ref 7.5–12.5)
Monocytes Absolute: 390 cells/uL (ref 200–950)
Monocytes Relative: 6 %
Neutro Abs: 3835 cells/uL (ref 1500–7800)
Neutrophils Relative %: 59 %
Platelets: 294 10*3/uL (ref 140–400)
RBC: 4.31 MIL/uL (ref 3.80–5.10)
RDW: 14.1 % (ref 11.0–15.0)
WBC: 6.5 10*3/uL (ref 3.8–10.8)

## 2016-01-13 LAB — COMPLETE METABOLIC PANEL WITH GFR
ALT: 16 U/L (ref 6–29)
AST: 11 U/L (ref 10–30)
Albumin: 3.9 g/dL (ref 3.6–5.1)
Alkaline Phosphatase: 34 U/L (ref 33–115)
BUN: 11 mg/dL (ref 7–25)
CO2: 28 mmol/L (ref 20–31)
Calcium: 8.9 mg/dL (ref 8.6–10.2)
Chloride: 103 mmol/L (ref 98–110)
Creat: 0.67 mg/dL (ref 0.50–1.10)
GFR, Est African American: >89 mL/min (ref >=60)
GFR, Est Non African American: >89 mL/min (ref >=60)
Glucose, Bld: 81 mg/dL (ref 65–99)
Potassium: 4.3 mmol/L (ref 3.5–5.3)
Sodium: 138 mmol/L (ref 135–146)
Total Bilirubin: 0.5 mg/dL (ref 0.2–1.2)
Total Protein: 6.2 g/dL (ref 6.1–8.1)

## 2016-01-14 ENCOUNTER — Telehealth: Payer: Self-pay | Admitting: Radiology

## 2016-01-14 MED ORDER — ADALIMUMAB 40 MG/0.8ML ~~LOC~~ AJKT: 1.0000 "pen " | AUTO-INJECTOR | SUBCUTANEOUS | 0 refills | Status: DC

## 2016-01-14 MED ORDER — METHOTREXATE (PF) 20 MG/0.4ML ~~LOC~~ SOAJ
1.0000 | SUBCUTANEOUS | 0 refills | Status: DC
Start: 2016-01-14 — End: 2016-05-12

## 2016-01-14 NOTE — Telephone Encounter (Signed)
I will call patient to advise labs are normal  

## 2016-01-14 NOTE — Telephone Encounter (Signed)
I have called patient to advise labs are normal  Humira and Rasuvo refilled per [employer]

## 2016-02-03 DIAGNOSIS — Z85038 Personal history of other malignant neoplasm of large intestine: Secondary | ICD-10-CM | POA: Insufficient documentation

## 2016-02-03 DIAGNOSIS — M24521 Contracture, right elbow: Secondary | ICD-10-CM | POA: Insufficient documentation

## 2016-02-03 DIAGNOSIS — M24529 Contracture, unspecified elbow: Secondary | ICD-10-CM | POA: Insufficient documentation

## 2016-02-03 DIAGNOSIS — M0609 Rheumatoid arthritis without rheumatoid factor, multiple sites: Secondary | ICD-10-CM | POA: Insufficient documentation

## 2016-02-03 DIAGNOSIS — J209 Acute bronchitis, unspecified: Secondary | ICD-10-CM | POA: Diagnosis not present

## 2016-02-03 DIAGNOSIS — Z1589 Genetic susceptibility to other disease: Secondary | ICD-10-CM | POA: Insufficient documentation

## 2016-02-03 NOTE — Progress Notes (Signed)
Office Visit Note  Patient: Dana Hicks             Date of Birth: 07-19-1975           MRN: [id number]             PCP: [name], MD Referring: [name], MD Visit Date: 02/06/2016 Occupation: Futures trader    Subjective:  Pain of the Right Elbow and Shoulder Pain (bilateral)   History of Present Illness: Dana Hicks is a 40 y.o. female with history of seronegative rheumatoid arthritis. She states for the last 3-4 weeks she's been having increase arthralgias she describes pain in her right elbow which is started about 2 weeks ago and got somewhat better after using a tennis elbow brace. She's been having discomfort in her bilateral shoulder joints. She's also noticed more  discomfort in her feet. She has not noticed any swollen joints. Her fingers are more stiff in the morning. She stopped her Humira and methotrexate in about a week ago secondary to upper respiratory tract infection. She went to see her PCP where she received Z-Pak and a cortisone injection.  Activities of Daily Living:  Patient reports morning stiffness for 2 hours.   Patient Denies nocturnal pain.  Difficulty dressing/grooming: Denies Difficulty climbing stairs: Denies Difficulty getting out of chair: Denies Difficulty using hands for taps, buttons, cutlery, and/or writing: Reports   Review of Systems  Constitutional: Positive for fatigue. Negative for night sweats, weight gain, weight loss and weakness.  HENT: Negative for mouth sores, trouble swallowing, trouble swallowing, mouth dryness and nose dryness.   Eyes: Negative for pain, redness, visual disturbance and dryness.  Respiratory: Negative for cough, shortness of breath and difficulty breathing.   Cardiovascular: Negative for chest pain, palpitations, hypertension, irregular heartbeat and swelling in legs/feet.  Gastrointestinal: Negative for blood in stool, constipation and diarrhea.  Endocrine: Negative for increased urination.    Genitourinary: Negative for vaginal dryness.  Musculoskeletal: Positive for arthralgias, joint pain and morning stiffness. Negative for joint swelling, myalgias, muscle weakness, muscle tenderness and myalgias.  Skin: Negative for color change, rash, hair loss, skin tightness, ulcers and sensitivity to sunlight.  Allergic/Immunologic: Negative for susceptible to infections.  Neurological: Negative for dizziness, memory loss and night sweats.  Hematological: Negative for swollen glands.  Psychiatric/Behavioral: Negative for depressed mood and sleep disturbance. The patient is not nervous/anxious.     PMFS History:  Patient Active Problem List   Diagnosis Date Noted  . Contracture of elbow joint 02/03/2016  . Contracture, right elbow 02/03/2016  . Rheumatoid arthritis of multiple sites with negative rheumatoid factor ([place]) 02/03/2016  . History of colon cancer 02/03/2016  . HLA B27 (HLA B27 positive) 02/03/2016    Past Medical History:  Diagnosis Date  . Cancer ([place]) 2001   cecum-chemo  . Rheumatoid arthritis ([place])   . Seasonal allergies     Family History  Problem Relation Age of Onset  . Arthritis/Rheumatoid Mother    Past Surgical History:  Procedure Laterality Date  . BREAST LUMPECTOMY WITH RADIOACTIVE SEED LOCALIZATION Left 06/28/2014   Procedure: RADIOACTIVE SEED LOCALIZATION LEFT BREAST LUMPECTOMY;  Surgeon: Excell Seltzer, MD;  Location: [facility];  Service: General;  Laterality: Left;  . COLONOSCOPY  06/19/14  . PORT A CATH REVISION  2001   in and out  . RIGHT COLECTOMY  2001   cecum cancer   Social History   Social History Narrative  . No narrative on file  Objective: Vital Signs: BP (!) 137/92 (BP Location: Left Arm, Patient Position: Sitting, Cuff Size: Normal)   Pulse 83   Resp 16   Ht '5\' 2"'  (1.575 m)   Wt 235 lb (106.6 kg)   LMP 02/06/2016   BMI 42.98 kg/m    Physical Exam  Constitutional: She is oriented to person, place, and  time. She appears well-developed and well-nourished.  HENT:  Head: Normocephalic and atraumatic.  Eyes: Conjunctivae and EOM are normal.  Neck: Normal range of motion.  Cardiovascular: Normal rate, regular rhythm, normal heart sounds and intact distal pulses.   Pulmonary/Chest: Effort normal and breath sounds normal.  Abdominal: Soft. Bowel sounds are normal.  Lymphadenopathy:    She has no cervical adenopathy.  Neurological: She is alert and oriented to person, place, and time.  Skin: Skin is warm and dry. Capillary refill takes less than 2 seconds.  Psychiatric: She has a normal mood and affect. Her behavior is normal.  Nursing note and vitals reviewed.    Musculoskeletal Exam: C-spine, thoracic, lumbar spine good range of motion. She has some discomfort with range of motion of her shoulder joints. Her elbow joints, wrist joints, MCPs, PIPs DIPs with good range of motion with no synovitis. Hip joints, knee joints, ankle joints, MTPs PIPs with good range of motion with no synovitis.  CDAI Exam: CDAI Homunculus Exam:   Joint Counts:  CDAI Tender Joint count: 0 CDAI Swollen Joint count: 0  Global Assessments:  Patient Global Assessment: 5 Provider Global Assessment: 5  CDAI Calculated Score: 10    Investigation: Findings:  October 2007 hepatitis panel negative 08/07/2015 CBC, CMP normal, 14 33 eta negative, TB: Negative, November 2017 CBC was normal comprehensive metabolic panel was normal    Imaging: No results found.  Speciality Comments: No specialty comments available.    Procedures:  No procedures performed Allergies: Codeine and Bactrim [sulfamethoxazole-trimethoprim]   Assessment / Plan:     Visit Diagnoses: Rheumatoid arthritis seronegative: Rheumatid factor negative, CCP negative, 14 33 eta negative, ANA negative with nodulosis. She's been having increase arthralgias and more stiffness. She has no synovitis on examination today. She's been off her  medications due to upper respiratory tract infection. She's concerned that her disease may not be very well controlled anymore. I obtained x-rays of her bilateral hands and bilateral feet today which did not show any joint space narrowing or erosive changes. X-ray findings were consistent with only mild osteoarthritis. I will schedule ultrasound of her hands and couple of months when she is back on her medications.  Primary osteoarthritis of both knees: Continues to have some discomfort  Right elbow pain: she has had problems with contracture in the past related she's had some discomfort in the last 2 weeks. She is doing better now.  Bilateral shoulder joint pain: Is better after the cortisone injection she received for upper respiratory tract infection.  HLA B27 (HLA B27 positive)  High risk medication use -her labs have been normal and we will check her labs again in 3 months and then every 3 months to monitor for drug toxicity Plan: CBC with Differential/Platelet, COMPLETE METABOLIC PANEL WITH GFR   History of colon cancer, diagnosed in 2001 status post colon resection and chemotherapy at age 33.  Obesity: Weight loss diet and exercise was discussed.  Association of heart disease with rheumatoid arthritis was discussed. Need to monitor blood pressure, cholesterol, and to exercise 30-60 minutes on daily basis was discussed. Poor dental hygiene can be a  predisposing factor for rheumatoid arthritis. Good dental hygiene was discussed.  Orders: Orders Placed This Encounter  Procedures  . XR Hand 2 View Right  . XR Foot 2 Views Right  . XR Foot 2 Views Left  . XR Hand 2 View Left  . CBC with Differential/Platelet  . COMPLETE METABOLIC PANEL WITH GFR   No orders of the defined types were placed in this encounter.   Face-to-face time spent with patient was 30 minutes. 50% of time was spent in counseling and coordination of care.  Follow-Up Instructions: Return in about 4 months (around  06/06/2016) for Rheumatoid arthritis.   Bo Merino, MD

## 2016-02-06 ENCOUNTER — Ambulatory Visit (INDEPENDENT_AMBULATORY_CARE_PROVIDER_SITE_OTHER): Payer: Self-pay

## 2016-02-06 ENCOUNTER — Encounter: Payer: Self-pay | Admitting: Rheumatology

## 2016-02-06 ENCOUNTER — Ambulatory Visit (INDEPENDENT_AMBULATORY_CARE_PROVIDER_SITE_OTHER): Payer: BLUE CROSS/BLUE SHIELD | Admitting: Rheumatology

## 2016-02-06 VITALS — BP 137/92 | HR 83 | Resp 16 | Ht 62.0 in | Wt 235.0 lb

## 2016-02-06 DIAGNOSIS — Z79899 Other long term (current) drug therapy: Secondary | ICD-10-CM | POA: Diagnosis not present

## 2016-02-06 DIAGNOSIS — Z6841 Body Mass Index (BMI) 40.0 and over, adult: Secondary | ICD-10-CM | POA: Diagnosis not present

## 2016-02-06 DIAGNOSIS — Z8261 Family history of arthritis: Secondary | ICD-10-CM | POA: Diagnosis not present

## 2016-02-06 DIAGNOSIS — M0609 Rheumatoid arthritis without rheumatoid factor, multiple sites: Secondary | ICD-10-CM | POA: Diagnosis not present

## 2016-02-06 DIAGNOSIS — Z889 Allergy status to unspecified drugs, medicaments and biological substances status: Secondary | ICD-10-CM | POA: Diagnosis not present

## 2016-02-06 DIAGNOSIS — Z85038 Personal history of other malignant neoplasm of large intestine: Secondary | ICD-10-CM

## 2016-02-06 DIAGNOSIS — E6609 Other obesity due to excess calories: Secondary | ICD-10-CM

## 2016-02-06 DIAGNOSIS — IMO0001 Reserved for inherently not codable concepts without codable children: Secondary | ICD-10-CM

## 2016-02-06 DIAGNOSIS — Z1589 Genetic susceptibility to other disease: Secondary | ICD-10-CM | POA: Diagnosis not present

## 2016-02-06 DIAGNOSIS — M170 Bilateral primary osteoarthritis of knee: Secondary | ICD-10-CM | POA: Diagnosis not present

## 2016-02-06 NOTE — Patient Instructions (Signed)
Standing Labs We placed an order today for your standing lab work.    Please come back and get your standing labs in February 2018 and every 3 months.    We have open lab Monday through Friday from 8:30-11:30 AM and 1:30-4 PM at the office of Dr. Tresa Moore, PA.   The office is located at [address] [place], [place], [place] [address] No appointment is necessary.   Labs are drawn by [facility].  You may receive a bill from [facility] for your lab work.

## 2016-02-06 NOTE — Progress Notes (Signed)
Rheumatology Medication Review by a Pharmacist Does the patient feel that his/her medications are working for him/her?  Yes Has the patient been experiencing any side effects to the medications prescribed?  No Does the patient have any problems obtaining medications?  No  Issues to address at subsequent visits: None   Pharmacist comments:  Dana Hicks is a pleasant 40 yo F who presents to clinic for follow up of her rheumatoid arthritis.  Patient is currently taking Humira every other week, methotrexate 20 mg weekly, and folic acid 2 mg daily.  Patient reports she has been sick since last week.  She went to her primary care provider on Wednesday and was prescribed azithromycin and given a Decadron injection.  Patient confirms she held her dose of methotrexate last week and plans to hold the Humira and methotrexate this week.  Advised patient she should not resume her medications until she has finished her antibiotic course and is recovered from her infection.  She voiced understanding.  Noted patient's blood pressure was 137/92.  Advised patient to avoid pseudoephedrine.  Patient confirms she has not taken any in the past two days.    Patient had standing labs on 01/13/16 which were normal.  She will be due for standing labs again in February 2018.  Her Hicks recent TB Gold was on 08/11/15 which was negative.  She will be due for TB Gold again in June 2018.  Patient denied any questions or concerns regarding her medications at this time.     Dana Hicks, Pharm.D., BCPS Clinical Pharmacist Pager: [phone] Phone: [phone] 02/06/2016 8:47 AM

## 2016-02-19 DIAGNOSIS — J209 Acute bronchitis, unspecified: Secondary | ICD-10-CM | POA: Diagnosis not present

## 2016-03-26 ENCOUNTER — Other Ambulatory Visit: Payer: Self-pay | Admitting: Rheumatology

## 2016-03-26 NOTE — Telephone Encounter (Signed)
Last Visit: 02/06/16 Next Visit: 06/07/16 Labs: 01/13/16 WNL TB Gold: 08/11/15 Neg  Okay to refill Humira?

## 2016-04-21 ENCOUNTER — Ambulatory Visit (INDEPENDENT_AMBULATORY_CARE_PROVIDER_SITE_OTHER): Payer: BLUE CROSS/BLUE SHIELD | Admitting: Rheumatology

## 2016-04-21 ENCOUNTER — Encounter: Payer: Self-pay | Admitting: Rheumatology

## 2016-04-21 VITALS — BP 130/76 | HR 80 | Resp 16 | Ht 62.0 in | Wt 228.0 lb

## 2016-04-21 DIAGNOSIS — Z889 Allergy status to unspecified drugs, medicaments and biological substances status: Secondary | ICD-10-CM | POA: Diagnosis not present

## 2016-04-21 DIAGNOSIS — Z1589 Genetic susceptibility to other disease: Secondary | ICD-10-CM | POA: Diagnosis not present

## 2016-04-21 DIAGNOSIS — Z79899 Other long term (current) drug therapy: Secondary | ICD-10-CM

## 2016-04-21 DIAGNOSIS — M0609 Rheumatoid arthritis without rheumatoid factor, multiple sites: Secondary | ICD-10-CM | POA: Diagnosis not present

## 2016-04-21 DIAGNOSIS — M25511 Pain in right shoulder: Secondary | ICD-10-CM | POA: Diagnosis not present

## 2016-04-21 DIAGNOSIS — G8929 Other chronic pain: Secondary | ICD-10-CM | POA: Diagnosis not present

## 2016-04-21 DIAGNOSIS — M25522 Pain in left elbow: Secondary | ICD-10-CM | POA: Diagnosis not present

## 2016-04-21 DIAGNOSIS — Z85038 Personal history of other malignant neoplasm of large intestine: Secondary | ICD-10-CM | POA: Diagnosis not present

## 2016-04-21 DIAGNOSIS — M25521 Pain in right elbow: Secondary | ICD-10-CM

## 2016-04-21 DIAGNOSIS — M170 Bilateral primary osteoarthritis of knee: Secondary | ICD-10-CM | POA: Diagnosis not present

## 2016-04-21 LAB — CBC WITH DIFFERENTIAL/PLATELET
Basophils Absolute: 65 cells/uL (ref 0–200)
Basophils Relative: 1 %
Eosinophils Absolute: 65 cells/uL (ref 15–500)
Eosinophils Relative: 1 %
HCT: 40.3 % (ref 35.0–45.0)
Hemoglobin: 13 g/dL (ref 11.7–15.5)
Lymphocytes Relative: 34 %
Lymphs Abs: 2210 cells/uL (ref 850–3900)
MCH: 30.4 pg (ref 27.0–33.0)
MCHC: 32.3 g/dL (ref 32.0–36.0)
MCV: 94.2 fL (ref 80.0–100.0)
MPV: 11.9 fL (ref 7.5–12.5)
Monocytes Absolute: 585 cells/uL (ref 200–950)
Monocytes Relative: 9 %
Neutro Abs: 3575 cells/uL (ref 1500–7800)
Neutrophils Relative %: 55 %
Platelets: 276 10*3/uL (ref 140–400)
RBC: 4.28 MIL/uL (ref 3.80–5.10)
RDW: 14.2 % (ref 11.0–15.0)
WBC: 6.5 10*3/uL (ref 3.8–10.8)

## 2016-04-21 NOTE — Progress Notes (Signed)
Pharmacy Note  Subjective: Patient presents today to the [facility] to see Dr. Estanislado Pandy.   Patient is currently taking Humira every other week.  She is being switched from Humira to Enbrel.  Patient seen by the pharmacist for counseling on Enbrel.    Objective: TB Test: negative (08/07/15) Hepatitis panel: negative (12/06/2005)  CBC    Component Value Date/Time   WBC 6.5 01/13/2016 0850   RBC 4.31 01/13/2016 0850   HGB 13.4 01/13/2016 0850   HGB 13.0 05/15/2009 1616   HCT 42.2 01/13/2016 0850   HCT 38.0 05/15/2009 1616   PLT 294 01/13/2016 0850   PLT 215 05/15/2009 1616   MCV 97.9 01/13/2016 0850   MCV 92.7 05/15/2009 1616   MCH 31.1 01/13/2016 0850   MCHC 31.8 (L) 01/13/2016 0850   RDW 14.1 01/13/2016 0850   RDW 13.6 05/15/2009 1616   LYMPHSABS 2,210 01/13/2016 0850   LYMPHSABS 2.2 05/15/2009 1616   MONOABS 390 01/13/2016 0850   MONOABS 0.5 05/15/2009 1616   EOSABS 65 01/13/2016 0850   EOSABS 0.1 05/15/2009 1616   BASOSABS 0 01/13/2016 0850   BASOSABS 0.0 05/15/2009 1616    Assessment/Plan:  Counseled patient that Enbrel is a TNF blocking agent.  Reviewed Enbrel dose of 50 mg once weekly.  Counseled patient on purpose, proper use, and adverse effects of Enbrel.  Reviewed the most common adverse effects including infections, headache, and injection site reactions.  Discussed that there is the possibility of an increased risk of malignancy but it is not well understood if this increased risk is due to the medication or the disease state.  Advised patient to get yearly dermatology exams due to risk of skin cancer.  Reviewed the importance of regular labs while on Enbrel therapy.  Advised patient to get standing labs one month after starting Enbrel then every 2 months.  Counseled patient that Enbrel should be held prior to scheduled surgery.  Counseled patient to avoid live vaccines while on Enbrel.  Advised patient to get annual influenza vaccine and the  pneumococcal vaccine if she has not already had one.  Provided patient with medication education material and answered all questions.  Patient voiced understanding.  Patient consented to Enbrel.  Will upload consent into the media tab.  Reviewed storage instructions for Enbrel.  Will apply for Enbrel through patient's insurance.  Advised patient to contact the office to schedule the first Enbrel shot once Enbrel is approved by insurance.  Patient is aware that initial Enbrel injection must be at least two weeks after her most recent Humira injection.    [name], Pharm.D., BCPS Clinical Pharmacist Pager: [phone] Phone: [phone] 04/21/2016 4:57 PM

## 2016-04-21 NOTE — Patient Instructions (Signed)
Etanercept injection What is this medicine? ETANERCEPT (et a NER sept) is used for the treatment of rheumatoid arthritis in adults and children. The medicine is also used to treat psoriatic arthritis, ankylosing spondylitis, and psoriasis. This medicine may be used for other purposes; ask your health care provider or pharmacist if you have questions. COMMON BRAND NAME(S): Enbrel What should I tell my health care provider before I take this medicine? They need to know if you have any of these conditions: -blood disorders -cancer -congestive heart failure -diabetes -exposure to chickenpox -immune system problems -infection -multiple sclerosis -seizure disorder -tuberculosis, a positive skin test for tuberculosis or have recently been in close contact with someone who has tuberculosis -Wegener's granulomatosis -an unusual or allergic reaction to etanercept, latex, other medicines, foods, dyes, or preservatives -pregnant or trying to get pregnant -breast-feeding How should I use this medicine? The medicine is given by injection under the skin. You will be taught how to prepare and give this medicine. Use exactly as directed. Take your medicine at regular intervals. Do not take your medicine more often than directed. It is important that you put your used needles and syringes in a special sharps container. Do not put them in a trash can. If you do not have a sharps container, call your pharmacist or healthcare provider to get one. A special MedGuide will be given to you by the pharmacist with each prescription and refill. Be sure to read this information carefully each time. Talk to your pediatrician regarding the use of this medicine in children. While this drug may be prescribed for children as young as 4 years of age for selected conditions, precautions do apply. Overdosage: If you think you have taken too much of this medicine contact a poison control center or emergency room at once. NOTE:  This medicine is only for you. Do not share this medicine with others. What if I miss a dose? If you miss a dose, contact your health care professional to find out when you should take your next dose. Do not take double or extra doses without advice. What may interact with this medicine? Do not take this medicine with any of the following medications: -anakinra This medicine may also interact with the following medications: -cyclophosphamide -sulfasalazine -vaccines This list may not describe all possible interactions. Give your health care provider a list of all the medicines, herbs, non-prescription drugs, or dietary supplements you use. Also tell them if you smoke, drink alcohol, or use illegal drugs. Some items may interact with your medicine. What should I watch for while using this medicine? Tell your doctor or healthcare professional if your symptoms do not start to get better or if they get worse. You will be tested for tuberculosis (TB) before you start this medicine. If your doctor prescribes any medicine for TB, you should start taking the TB medicine before starting this medicine. Make sure to finish the full course of TB medicine. Call your doctor or health care professional for advice if you get a fever, chills or sore throat, or other symptoms of a cold or flu. Do not treat yourself. This drug decreases your body's ability to fight infections. Try to avoid being around people who are sick. What side effects may I notice from receiving this medicine? Side effects that you should report to your doctor or health care professional as soon as possible: -allergic reactions like skin rash, itching or hives, swelling of the face, lips, or tongue -changes in vision -fever, chills   or any other sign of infection -numbness or tingling in legs or other parts of the body -red, scaly patches or raised bumps on the skin -shortness of breath or difficulty breathing -swollen lymph nodes in the  neck, underarm, or groin areas -unexplained weight loss -unusual bleeding or bruising -unusual swelling or fluid retention in the legs -unusually weak or tired Side effects that usually do not require medical attention (report to your doctor or health care professional if they continue or are bothersome): -dizziness -headache -nausea -redness, itching, or swelling at the injection site -vomiting This list may not describe all possible side effects. Call your doctor for medical advice about side effects. You may report side effects to FDA at 1-800-FDA-1088. Where should I keep my medicine? Keep out of the reach of children. Store between 2 and 8 degrees C (36 and 46 degrees F). Do not freeze or shake. Protect from light. Throw away any unused medicine after the expiration date. You will be instructed on how to store this medicine. NOTE: This sheet is a summary. It may not cover all possible information. If you have questions about this medicine, talk to your doctor, pharmacist, or health care provider.  2018 Elsevier/Gold Standard (2011-08-16 15:33:36)  

## 2016-04-21 NOTE — Progress Notes (Signed)
Office Visit Note  Patient: Dana Hicks             Date of Birth: 09-05-1975           MRN: [id number]             PCP: [name], MD Referring: [name], MD Visit Date: 04/21/2016 Occupation: '@GUAROCC' @    Subjective:  Increased joint pain   History of Present Illness: Dana Hicks is a 41 y.o. female with history of rheumatoid arthritis. She states she's been having increased pain in multiple joints which involves her right shoulder joint, bilateral elbow joints especially the right elbow joint. She states her hands are not hurting as much. The she's having pain and stiffness in her bilateral knee joints. She's been also having discomfort in her bilateral feet . She's been on Humira for multiple years and would like to change therapy.  Activities of Daily Living:  Patient reports morning stiffness for 15 minutes.   Patient Denies nocturnal pain.  Difficulty dressing/grooming: Denies Difficulty climbing stairs: Reports Difficulty getting out of chair: Denies Difficulty using hands for taps, buttons, cutlery, and/or writing: Denies   Review of Systems  Constitutional: Positive for fatigue. Negative for night sweats, weight gain, weight loss and weakness.  HENT: Negative for mouth sores, trouble swallowing, trouble swallowing, mouth dryness and nose dryness.   Eyes: Negative for pain, redness, visual disturbance and dryness.  Respiratory: Negative for cough, shortness of breath and difficulty breathing.   Cardiovascular: Negative for chest pain, palpitations, hypertension, irregular heartbeat and swelling in legs/feet.  Gastrointestinal: Negative for blood in stool, constipation and diarrhea.  Endocrine: Negative for increased urination.  Genitourinary: Negative for vaginal dryness.  Musculoskeletal: Positive for arthralgias, joint pain and morning stiffness. Negative for joint swelling, myalgias, muscle weakness, muscle tenderness and myalgias.  Skin: Negative  for color change, rash, hair loss, skin tightness, ulcers and sensitivity to sunlight.  Allergic/Immunologic: Negative for susceptible to infections.  Neurological: Negative for dizziness, memory loss and night sweats.  Hematological: Negative for swollen glands.  Psychiatric/Behavioral: Positive for sleep disturbance. Negative for depressed mood. The patient is not nervous/anxious.     PMFS History:  Patient Active Problem List   Diagnosis Date Noted  . High risk medication use 02/06/2016  . Primary osteoarthritis of both knees 02/06/2016  . Family history of rheumatoid arthritis 02/06/2016  . H/O seasonal allergies 02/06/2016  . Contracture of elbow joint 02/03/2016  . Contracture, right elbow 02/03/2016  . Rheumatoid arthritis of multiple sites with negative rheumatoid factor ([facility] [facility]) 02/03/2016  . History of colon cancer 02/03/2016  . HLA B27 (HLA B27 positive) 02/03/2016    Past Medical History:  Diagnosis Date  . Cancer ([facility]) 2001   cecum-chemo  . Rheumatoid arthritis ([place])   . Seasonal allergies     Family History  Problem Relation Age of Onset  . Arthritis/Rheumatoid Mother    Past Surgical History:  Procedure Laterality Date  . BREAST LUMPECTOMY WITH RADIOACTIVE SEED LOCALIZATION Left 06/28/2014   Procedure: RADIOACTIVE SEED LOCALIZATION LEFT BREAST LUMPECTOMY;  Surgeon: Excell Seltzer, MD;  Location: [facility];  Service: General;  Laterality: Left;  . COLONOSCOPY  06/19/14  . PORT A CATH REVISION  2001   in and out  . RIGHT COLECTOMY  2001   cecum cancer   Social History   Social History Narrative  . No narrative on file     Objective: Vital Signs: BP 130/76   Pulse 80  Resp 16   Ht '5\' 2"'  (1.575 m)   Wt 228 lb (103.4 kg)   LMP 04/07/2016   BMI 41.70 kg/m    Physical Exam  Constitutional: She is oriented to person, place, and time. She appears well-developed and well-nourished.  HENT:  Head: Normocephalic and atraumatic.  Eyes:  Conjunctivae and EOM are normal.  Neck: Normal range of motion.  Cardiovascular: Normal rate, regular rhythm, normal heart sounds and intact distal pulses.   Pulmonary/Chest: Effort normal and breath sounds normal.  Abdominal: Soft. Bowel sounds are normal.  Lymphadenopathy:    She has no cervical adenopathy.  Neurological: She is alert and oriented to person, place, and time.  Skin: Skin is warm and dry. Capillary refill takes less than 2 seconds.  Psychiatric: She has a normal mood and affect. Her behavior is normal.  Nursing note and vitals reviewed.    Musculoskeletal Exam: C-spine and thoracic lumbar spine good range of motion. She had discomfort with range of motion of her shoulder joints elbow joints. Wrists joint MCPs PIPs with good range of motion with no synovitis hip joints are good range of motion she has discomfort with range of motion of her knee joints without any warmth swelling or effusion she has tenderness across her MTP joints with no synovitis was noted.  CDAI Exam: CDAI Homunculus Exam:   Tenderness:  RUE: glenohumeral and ulnohumeral and radiohumeral RLE: tibiofemoral LLE: tibiofemoral Right foot: 1st MTP, 2nd MTP and 3rd MTP Left foot: 2nd MTP and 3rd MTP  Joint Counts:  CDAI Tender Joint count: 4 CDAI Swollen Joint count: 0  Global Assessments:  Patient Global Assessment: 5 Provider Global Assessment: 3  CDAI Calculated Score: 12    Investigation: Findings:  June 2017:  CBC and comprehensive metabolic panel were normal; 14-3-3 eta was negative, and TB Gold was negative. 08/2015   Rapid-3 was 2.7 which comes under moderate severity.  Although she had no synovitis, I believe it reflects her knee joint pain.     No visits with results within 3 Month(s) from this visit.  Latest known visit with results is:  Orders Only on 01/13/2016  Component Date Value Ref Range Status  . WBC 01/13/2016 6.5  3.8 - 10.8 K/uL Final  . RBC 01/13/2016 4.31  3.80 -  5.10 MIL/uL Final  . Hemoglobin 01/13/2016 13.4  11.7 - 15.5 g/dL Final  . HCT 01/13/2016 42.2  35.0 - 45.0 % Final  . MCV 01/13/2016 97.9  80.0 - 100.0 fL Final  . MCH 01/13/2016 31.1  27.0 - 33.0 pg Final  . MCHC 01/13/2016 31.8* 32.0 - 36.0 g/dL Final  . RDW 01/13/2016 14.1  11.0 - 15.0 % Final  . Platelets 01/13/2016 294  140 - 400 K/uL Final  . MPV 01/13/2016 11.1  7.5 - 12.5 fL Final  . Neutro Abs 01/13/2016 3835  1,500 - 7,800 cells/uL Final  . Lymphs Abs 01/13/2016 2210  850 - 3,900 cells/uL Final  . Monocytes Absolute 01/13/2016 390  200 - 950 cells/uL Final  . Eosinophils Absolute 01/13/2016 65  15 - 500 cells/uL Final  . Basophils Absolute 01/13/2016 0  0 - 200 cells/uL Final  . Neutrophils Relative % 01/13/2016 59  % Final  . Lymphocytes Relative 01/13/2016 34  % Final  . Monocytes Relative 01/13/2016 6  % Final  . Eosinophils Relative 01/13/2016 1  % Final  . Basophils Relative 01/13/2016 0  % Final  . Smear Review 01/13/2016 Criteria for review not  met   Final  . Sodium 01/13/2016 138  135 - 146 mmol/L Final  . Potassium 01/13/2016 4.3  3.5 - 5.3 mmol/L Final  . Chloride 01/13/2016 103  98 - 110 mmol/L Final  . CO2 01/13/2016 28  20 - 31 mmol/L Final  . Glucose, Bld 01/13/2016 81  65 - 99 mg/dL Final  . BUN 01/13/2016 11  7 - 25 mg/dL Final  . Creat 01/13/2016 0.67  0.50 - 1.10 mg/dL Final  . Total Bilirubin 01/13/2016 0.5  0.2 - 1.2 mg/dL Final  . Alkaline Phosphatase 01/13/2016 34  33 - 115 U/L Final  . AST 01/13/2016 11  10 - 30 U/L Final  . ALT 01/13/2016 16  6 - 29 U/L Final  . Total Protein 01/13/2016 6.2  6.1 - 8.1 g/dL Final  . Albumin 01/13/2016 3.9  3.6 - 5.1 g/dL Final  . Calcium 01/13/2016 8.9  8.6 - 10.2 mg/dL Final  . GFR, Est African American 01/13/2016 >89  >=60 mL/min Final  . GFR, Est Non African American 01/13/2016 >89  >=60 mL/min Final    Imaging: No results found.  Speciality Comments: No specialty comments available.    Procedures:    No procedures performed Allergies: Codeine and Bactrim [sulfamethoxazole-trimethoprim]   Assessment / Plan:     Visit Diagnoses: Rheumatoid arthritis of multiple sites with negative rheumatoid factor [facility]): Patient complains of increased pain and discomfort. She had no synovitis and examination which she's multiple arthralgias and tender joints on examination. She's been on Humira for multiple years and she believes the Humira is not working anymore. Different treatment options and their side effects were discussed at length. She is more inclined towards the starting on Enbrel. Handout was given consent was taken. Once approved taken bring her back for Enbrel injection.  Chronic right shoulder pain [place]lin increase in right shoulder joint pain  Pain of both elbows: She's been experiencing increased pain  High risk medication use -Humira and methotrexate. We'll check labs today Plan: CBC with Differential/Platelet, COMPLETE METABOLIC PANEL WITH GFR  Primary osteoarthritis of both knees  HLA B27 (HLA B27 positive)  History of colon cancer  H/O seasonal allergies    Orders: No orders of the defined types were placed in this encounter.  No orders of the defined types were placed in this encounter.   Face-to-face time spent with patient was 30 minutes. 50% of time was spent in counseling and coordination of care.  Follow-Up Instructions: Return in about 3 months (around 07/19/2016) for Rheumatoid arthritis.   Bo Merino, MD  Note - This record has been created using Editor, commissioning.  Chart creation errors have been sought, but may not always  have been located. Such creation errors do not reflect on  the standard of medical care.

## 2016-04-22 ENCOUNTER — Other Ambulatory Visit: Payer: Self-pay | Admitting: Rheumatology

## 2016-04-22 ENCOUNTER — Telehealth: Payer: Self-pay

## 2016-04-22 LAB — COMPLETE METABOLIC PANEL WITH GFR
ALT: 16 U/L (ref 6–29)
AST: 15 U/L (ref 10–30)
Albumin: 4 g/dL (ref 3.6–5.1)
Alkaline Phosphatase: 33 U/L (ref 33–115)
BUN: 9 mg/dL (ref 7–25)
CO2: 19 mmol/L — ABNORMAL LOW (ref 20–31)
Calcium: 9.2 mg/dL (ref 8.6–10.2)
Chloride: 102 mmol/L (ref 98–110)
Creat: 0.72 mg/dL (ref 0.50–1.10)
GFR, Est African American: >89 mL/min (ref >=60)
GFR, Est Non African American: >89 mL/min (ref >=60)
Glucose, Bld: 106 mg/dL — ABNORMAL HIGH (ref 65–99)
Potassium: 3.7 mmol/L (ref 3.5–5.3)
Sodium: 137 mmol/L (ref 135–146)
Total Bilirubin: 0.5 mg/dL (ref 0.2–1.2)
Total Protein: 6.3 g/dL (ref 6.1–8.1)

## 2016-04-22 MED ORDER — DICLOFENAC SODIUM 1 % TD GEL: TRANSDERMAL | 3 refills | Status: DC

## 2016-04-22 MED ORDER — ETANERCEPT 50 MG/ML ~~LOC~~ SOAJ: 50.0000 mg | SUBCUTANEOUS | 2 refills | Status: DC

## 2016-04-22 NOTE — Telephone Encounter (Signed)
Received a conformation from Cover [employer] regarding a prior authorization approval for Enbrel from 04/22/16 to 04/22/17.   Reference number:[id number] Phone number: [phone]   Demetrios Loll, CPhT  9:00 AM

## 2016-04-22 NOTE — Progress Notes (Signed)
Labs normal.

## 2016-04-22 NOTE — Telephone Encounter (Signed)
Patient states she forgot to ask for prescription when she was in office yesterday.  Okay to send prescription for Voltaren Gel?

## 2016-04-22 NOTE — Telephone Encounter (Signed)
ok 

## 2016-04-22 NOTE — Telephone Encounter (Signed)
Advised patient Enbrel was approved.  Prescription was sent to [facility].  Reviewed that Enbrel should be kept in refrigerator once received from the pharmacy.  First dose must be administered in the office.  Patient reports she took Humira yesterday.  Patient is aware she should schedule nursing visit on or after 05/05/16 for her initial Enbrel injection.    Seth Bake, this patient will be a new start Enbrel.  She will need a nursing visit on or after 05/05/16.     [name], Pharm.D., BCPS, CPP Clinical Pharmacist Pager: [phone] Phone: [phone] 04/22/2016 9:27 AM

## 2016-04-22 NOTE — Telephone Encounter (Signed)
Patient is requesting refill of Dana Hicks to be sent to [facility] [address].

## 2016-04-22 NOTE — Telephone Encounter (Signed)
Clarified with patient that the medication she is requesting is Voltaren Gel.

## 2016-04-28 ENCOUNTER — Other Ambulatory Visit: Payer: Self-pay | Admitting: Rheumatology

## 2016-04-28 MED ORDER — DICLOFENAC SODIUM 1 % TD GEL: TRANSDERMAL | 3 refills | Status: DC

## 2016-04-28 NOTE — Telephone Encounter (Signed)
Last Visit: 02/06/16 Next Visit: 06/07/16  Okay to refill Voltaren Gel?

## 2016-04-28 NOTE — Telephone Encounter (Signed)
ok 

## 2016-04-28 NOTE — Telephone Encounter (Signed)
Patient called requesting a refill on her Voltaren.  [facility] uses [facility] [address]  OO#[phone].  Thank you.

## 2016-05-11 ENCOUNTER — Ambulatory Visit (INDEPENDENT_AMBULATORY_CARE_PROVIDER_SITE_OTHER): Payer: BLUE CROSS/BLUE SHIELD | Admitting: *Deleted

## 2016-05-11 VITALS — BP 153/96 | HR 63

## 2016-05-11 DIAGNOSIS — M0579 Rheumatoid arthritis with rheumatoid factor of multiple sites without organ or systems involvement: Secondary | ICD-10-CM | POA: Diagnosis not present

## 2016-05-11 MED ORDER — ETANERCEPT 50 MG/ML ~~LOC~~ SOAJ
50.0000 mg | Freq: Once | SUBCUTANEOUS | Status: AC
  Administered 2016-05-11: 50 mg via SUBCUTANEOUS

## 2016-05-11 NOTE — Patient Instructions (Signed)
Standing Labs We placed an order today for your standing lab work.    Please come back and get your standing labs in one month  We have open lab Monday through Friday from 8:30-11:30 AM and 1:30-4 PM at the office of Dr. Tresa Moore, PA.   The office is located at [address] [place], [place], [place] [address] No appointment is necessary.   Labs are drawn by [facility].  You may receive a bill from [facility] for your lab work.

## 2016-05-11 NOTE — Progress Notes (Signed)
Patient in office today for initial start of Enbrel. Patient states her last Humira injection was 2 weeks ago. Patient self injected her Enbrel injection. Patient tolerated injection well.  Patient was monitored in office for 30 minutes after administration for adverse reactions to medication. No adverse reaction noted.   Administrations This Visit    etanercept (ENBREL) 50 MG/ML injection 50 mg    Admin Date 05/11/2016 Action Given Dose 50 mg Route Subcutaneous Administered By Carole Binning, LPN

## 2016-05-11 NOTE — Progress Notes (Signed)
Pharmacy Note  Subjective:   Patient is being initiated on Enbrel.  Patient was previously counseled extensively on Enbrel on 04/21/16 and consented to initiation of Enbrel at that time.  Patient presents to clinic today to receive the first dose of Enbrel.  She confirms her most recent Humira injection was at least two weeks ago.       Objective: CMP     Component Value Date/Time   NA 137 04/21/2016 1656   K 3.7 04/21/2016 1656   CL 102 04/21/2016 1656   CO2 19 (L) 04/21/2016 1656   GLUCOSE 106 (H) 04/21/2016 1656   BUN 9 04/21/2016 1656   CREATININE 0.72 04/21/2016 1656   CALCIUM 9.2 04/21/2016 1656   PROT 6.3 04/21/2016 1656   ALBUMIN 4.0 04/21/2016 1656   AST 15 04/21/2016 1656   ALT 16 04/21/2016 1656   ALKPHOS 33 04/21/2016 1656   BILITOT 0.5 04/21/2016 1656   GFRNONAA >89 04/21/2016 1656   GFRAA >89 04/21/2016 1656   CBC    Component Value Date/Time   WBC 6.5 04/21/2016 1656   RBC 4.28 04/21/2016 1656   HGB 13.0 04/21/2016 1656   HGB 13.0 05/15/2009 1616   HCT 40.3 04/21/2016 1656   HCT 38.0 05/15/2009 1616   PLT 276 04/21/2016 1656   PLT 215 05/15/2009 1616   MCV 94.2 04/21/2016 1656   MCV 92.7 05/15/2009 1616   MCH 30.4 04/21/2016 1656   MCHC 32.3 04/21/2016 1656   RDW 14.2 04/21/2016 1656   RDW 13.6 05/15/2009 1616   LYMPHSABS 2,210 04/21/2016 1656   LYMPHSABS 2.2 05/15/2009 1616   MONOABS 585 04/21/2016 1656   MONOABS 0.5 05/15/2009 1616   EOSABS 65 04/21/2016 1656   EOSABS 0.1 05/15/2009 1616   BASOSABS 65 04/21/2016 1656   BASOSABS 0.0 05/15/2009 1616    TB Gold: negative (08/07/15)  Assessment/Plan:  Patient was counseled on how to administer subcutaneous Enbrel injection using a demonstration pen.  Patient administered the Enbrel injection.  The medication was self-administered in the .  Lot: [id number], Exp. 6/20.  Patient was monitored for 30 minutes post injection.  Patient will need standing lab orders in one month.  Provided patient with standing  lab instructions.  Patient has follow up appointment scheduled for 07/16/16.    [name], Pharm.D., BCPS Clinical Pharmacist Pager: [phone] Phone: [phone] 05/11/2016 9:04 AM

## 2016-05-12 ENCOUNTER — Other Ambulatory Visit: Payer: Self-pay | Admitting: *Deleted

## 2016-05-12 MED ORDER — METHOTREXATE (PF) 20 MG/0.4ML ~~LOC~~ SOAJ: 1.0000 "pen " | SUBCUTANEOUS | 0 refills | Status: DC

## 2016-05-12 NOTE — Telephone Encounter (Signed)
Refill request received via fax  Last Visit: 04/21/16 Next Visit: 07/16/16 Labs: 04/21/16  Okay to refill [facility]?

## 2016-05-12 NOTE — Telephone Encounter (Signed)
ok 

## 2016-05-17 DIAGNOSIS — J019 Acute sinusitis, unspecified: Secondary | ICD-10-CM | POA: Diagnosis not present

## 2016-06-07 ENCOUNTER — Ambulatory Visit: Payer: BLUE CROSS/BLUE SHIELD | Admitting: Rheumatology

## 2016-06-11 ENCOUNTER — Ambulatory Visit (INDEPENDENT_AMBULATORY_CARE_PROVIDER_SITE_OTHER): Payer: BLUE CROSS/BLUE SHIELD

## 2016-06-11 ENCOUNTER — Ambulatory Visit (INDEPENDENT_AMBULATORY_CARE_PROVIDER_SITE_OTHER): Payer: BLUE CROSS/BLUE SHIELD | Admitting: Podiatry

## 2016-06-11 ENCOUNTER — Encounter: Payer: Self-pay | Admitting: Podiatry

## 2016-06-11 VITALS — BP 119/77 | HR 70 | Resp 16 | Ht 62.0 in | Wt 200.0 lb

## 2016-06-11 DIAGNOSIS — M722 Plantar fascial fibromatosis: Secondary | ICD-10-CM

## 2016-06-11 DIAGNOSIS — M79671 Pain in right foot: Secondary | ICD-10-CM

## 2016-06-11 MED ORDER — TRIAMCINOLONE ACETONIDE 10 MG/ML IJ SUSP
10.0000 mg | Freq: Once | INTRAMUSCULAR | Status: AC
  Administered 2016-06-11: 10 mg

## 2016-06-11 NOTE — Progress Notes (Signed)
Subjective:     Patient ID: Dana Hicks, female   DOB: 1975-07-03, 41 y.o.   MRN: [id number]  HPI patient states that she has a nodule on the bottom of the right arch that has been sore and she's tried topical medicine in the past. States that she does have rheumatoid arthritis which is probably a part of this problem   Review of Systems  All other systems reviewed and are negative.      Objective:   Physical Exam  Constitutional: She is oriented to person, place, and time.  Cardiovascular: Intact distal pulses.   Musculoskeletal: Normal range of motion.  Neurological: She is oriented to person, place, and time.  Skin: Skin is warm.  Nursing note and vitals reviewed.  neurovascular status intact with patient having good muscle strength and rom. Patient is found to have nodule plantar left arch that is organized and has been present for a number of years. Painful when palpated     Assessment:     Fasciitis left and possible rhematoid nodule    Plan:      H&P performed and xray reviewed. Today I injected the left plantar fascia with 3mg . Kenalog and 5 mg. zylocaine. I advised on heat treatment and discussed possible excision in future if necessary. Will be seen to reavaluate.  Xray indicates no calcification and small spurring

## 2016-06-11 NOTE — Progress Notes (Signed)
   Subjective:    Patient ID: Dana Hicks, female    DOB: [birth year], 41 y.o.   MRN: [id number]  HPI Chief Complaint  Patient presents with  . Foot Pain    Right foot; knot in heel (towards the medial side); pt stated, "Some days feels harder than other days; got from a podiatrist years ago a compound cream that did help foot; does have RA and has a heel spur"      Review of Systems  HENT: Positive for sinus pain.   All other systems reviewed and are negative.      Objective:   Physical Exam        Assessment & Plan:

## 2016-06-28 ENCOUNTER — Telehealth: Payer: Self-pay | Admitting: Rheumatology

## 2016-06-28 NOTE — Progress Notes (Signed)
Office Visit Note  Patient: Dana Hicks             Date of Birth: 07-Jun-1975           MRN: [id number]             PCP: [name], MD Referring: [name], MD Visit Date: 07/02/2016 Occupation: _0 @    Subjective:  Pain of the Left Knee; Medication Management (has had trouble with Enbrel has chest heaviness and short of breath/ fatigue with ); and Shortness of Breath   History of Present Illness: Dana Hicks is a 41 y.o. female with history of some seronegative rheumatoid arthritis. She was switched from Humira to Enbrel due to inadequate response to Humira. She tried Enbrel for about 6 weeks. She states after taking the Enbrel shots for the next 3 days she had some chest tightness and shortness of breath. It was discontinued. Her last injection was this Monday. She's been experiencing increased fatigue. She has not noticed any improvement on Enbrel. She continues to have pain in her left knee joint and her left hand. She saw a podiatrist for a nodule on her right foot plantar aspect. She states the podiatrist told her it was rheumatoid nodule and injected the cortisone did not has decreased in size per patient. She's been having some stiffness in her right elbow.  Activities of Daily Living:  Patient reports morning stiffness for 15 minutes.   Patient Denies nocturnal pain.  Difficulty dressing/grooming: Denies Difficulty climbing stairs: Reports Difficulty getting out of chair: Denies Difficulty using hands for taps, buttons, cutlery, and/or writing: Denies   Review of Systems  Constitutional: Positive for fatigue and weakness. Negative for night sweats, weight gain and weight loss.  HENT: Positive for mouth dryness. Negative for mouth sores and nose dryness.   Eyes: Negative for pain, redness, visual disturbance and dryness.  Respiratory: Positive for shortness of breath. Negative for cough and difficulty breathing.   Cardiovascular: Negative for chest pain,  palpitations, hypertension, irregular heartbeat and swelling in legs/feet.  Gastrointestinal: Negative for blood in stool, constipation and diarrhea.  Endocrine: Negative for increased urination.  Genitourinary: Negative for painful urination.  Musculoskeletal: Positive for arthralgias, joint pain, joint swelling and morning stiffness. Negative for myalgias and myalgias.  Skin: Negative for color change, rash, nodules/bumps, redness, skin tightness, ulcers and sensitivity to sunlight.  Allergic/Immunologic: Negative for susceptible to infections.  Neurological: Negative for dizziness, headaches and night sweats.  Hematological: Negative for swollen glands.  Psychiatric/Behavioral: Negative for depressed mood and sleep disturbance. The patient is not nervous/anxious.     PMFS History:  Patient Active Problem List   Diagnosis Date Noted  . High risk medication use 02/06/2016  . Primary osteoarthritis of both knees 02/06/2016  . Family history of rheumatoid arthritis 02/06/2016  . H/O seasonal allergies 02/06/2016  . Contracture of elbow joint 02/03/2016  . Contracture, right elbow 02/03/2016  . Rheumatoid arthritis of multiple sites with negative rheumatoid factor ([place]) 02/03/2016  . History of colon cancer 02/03/2016  . HLA B27 (HLA B27 positive) 02/03/2016    Past Medical History:  Diagnosis Date  . Cancer ([place]) 2001   cecum-chemo  . Rheumatoid arthritis ([place])   . Seasonal allergies     Family History  Problem Relation Age of Onset  . Arthritis/Rheumatoid Mother   . Allergic rhinitis Father   . Angioedema Neg Hx   . Asthma Neg Hx   . Atopy Neg Hx   . Eczema  Neg Hx   . Immunodeficiency Neg Hx   . Urticaria Neg Hx    Past Surgical History:  Procedure Laterality Date  . BREAST LUMPECTOMY WITH RADIOACTIVE SEED LOCALIZATION Left 06/28/2014   Procedure: RADIOACTIVE SEED LOCALIZATION LEFT BREAST LUMPECTOMY;  Surgeon: Excell Seltzer, MD;  Location: [facility];   Service: General;  Laterality: Left;  . COLONOSCOPY  06/19/14  . PORT A CATH REVISION  2001   in and out  . RIGHT COLECTOMY  2001   cecum cancer   Social History   Social History Narrative  . No narrative on file     Objective: Vital Signs: BP 130/70   Pulse 78   Resp 16   Wt 228 lb (103.4 kg)   LMP 06/25/2016   BMI 41.43 kg/m    Physical Exam  Constitutional: She is oriented to person, place, and time. She appears well-developed and well-nourished.  HENT:  Head: Normocephalic and atraumatic.  Eyes: Conjunctivae and EOM are normal.  Neck: Normal range of motion.  Cardiovascular: Normal rate, regular rhythm, normal heart sounds and intact distal pulses.   Pulmonary/Chest: Effort normal and breath sounds normal.  Abdominal: Soft. Bowel sounds are normal.  Lymphadenopathy:    She has no cervical adenopathy.  Neurological: She is alert and oriented to person, place, and time.  Skin: Skin is warm and dry. Capillary refill takes less than 2 seconds.  Psychiatric: She has a normal mood and affect. Her behavior is normal.  Nursing note and vitals reviewed.    Musculoskeletal Exam: C-spine and thoracic lumbar spine good range of motion. Shoulder joints elbow joints wrist joints are good range of motion. She has good range of motion of her MCPs PIPs DIPs. She is tenderness on palpation of her left third MCP and PIP joints but no synovitis was noted. She also has tenderness on palpation over right elbow joint. Hip joints knee joints ankle tends MTPs PIPs DIPs are good range of motion. She's been having discomfort with range of motion of her left knee but no warmth swelling or effusion was noted.  CDAI Exam: CDAI Homunculus Exam:   Tenderness:  RUE: ulnohumeral and radiohumeral Left hand: 3rd MCP and 3rd PIP LLE: tibiofemoral  Joint Counts:  CDAI Tender Joint count: 4 CDAI Swollen Joint count: 0  Global Assessments:  Patient Global Assessment: 7 Provider Global Assessment:  3  CDAI Calculated Score: 14    Investigation: Findings:  Patient has recently discontinued Enbrel due to side effects, is here to discuss new medications  TB Test: negative (08/07/15) Hepatitis panel: negative (12/06/2005)   CBC Latest Ref Rng & Units 04/21/2016 01/13/2016 06/28/2014  WBC 3.8 - 10.8 K/uL 6.5 6.5 -  Hemoglobin 11.7 - 15.5 g/dL 13.0 13.4 12.5  Hematocrit 35.0 - 45.0 % 40.3 42.2 -  Platelets 140 - 400 K/uL 276 294 -    CMP Latest Ref Rng & Units 04/21/2016 01/13/2016 05/15/2009  Glucose 65 - 99 mg/dL 106(H) 81 84  BUN 7 - 25 mg/dL _0 Creatinine 0.50 - 1.10 mg/dL 0.72 0.67 0.75  Sodium 135 - 146 mmol/L 137 138 140  Potassium 3.5 - 5.3 mmol/L 3.7 4.3 3.9  Chloride 98 - 110 mmol/L 102 103 105  CO2 20 - 31 mmol/L 19(L) 28 28  Calcium 8.6 - 10.2 mg/dL 9.2 8.9 8.8  Total Protein 6.1 - 8.1 g/dL 6.3 6.2 6.9  Total Bilirubin 0.2 - 1.2 mg/dL 0.5 0.5 0.4  Alkaline Phos 33 -  115 U/L 33 34 38(L)  AST 10 - 30 U/L _0 ALT 6 - 29 U/L _1 Imaging: Dg Foot 2 Views Right  Result Date: 06/11/2016 Please see detailed radiograph report in office note.   Speciality Comments: No specialty comments available.    Procedures:  Large Joint Inj Date/Time: 07/02/2016 10:43 AM Performed by: Bo Merino Authorized by: Bo Merino   Consent Given by:  Patient Site marked: the procedure site was marked   Timeout: prior to procedure the correct patient, procedure, and site was verified   Indications:  Pain and joint swelling Location:  Knee Site:  L knee Prep: patient was prepped and draped in usual sterile fashion   Needle Size:  27 G Needle Length:  1.5 inches Approach:  Medial Ultrasound Guidance: No   Fluoroscopic Guidance: No   Arthrogram: No   Medications:  1.5 mL lidocaine 1 %; 40 mg triamcinolone acetonide 40 MG/ML Aspiration Attempted: Yes   Aspirate amount (mL):  0 Patient tolerance:  Patient tolerated the procedure well with no  immediate complications   Allergies: Codeine; Penicillins; and Bactrim [sulfamethoxazole-trimethoprim]   Assessment / Plan:     Visit Diagnoses: Rheumatoid arthritis of multiple sites with negative rheumatoid factor ([place]): She continues to have pain and discomfort in multiple joints. She had inadequate response to Humira. She has been having side effects from Enbrel and wants to discontinue it now. Different treatment options and side effects were discussed. Patient would like to proceed with Orencia. I will apply every Orencia. Handout was given consent was taken today.  High risk medication use - Rasuvo 20 g subcutaneous, folic acid 2 mg by mouth daily, Enbrel(failed Humira)  HLA B27 (HLA B27 positive)  Primary osteoarthritis of both knees: She's been having increased pain and discomfort in her left knee joint and difficulty walking. She requests a cortisone injection. The procedure is described above.  History of colon cancer    Orders: Orders Placed This Encounter  Procedures  . Large Joint Injection/Arthrocentesis   No orders of the defined types were placed in this encounter.   Face-to-face time spent with patient was 30 minutes. 50% of time was spent in counseling and coordination of care.  Follow-Up Instructions: Return in about 3 months (around 10/02/2016) for Rheumatoid arthritis.   Bo Merino, MD  Note - This record has been created using Editor, commissioning.  Chart creation errors have been sought, but may not always  have been located. Such creation errors do not reflect on  the standard of medical care.

## 2016-06-28 NOTE — Telephone Encounter (Signed)
Reviewed patient's chart.  She was started on Enbrel on 05/11/16.  I called patient to discuss Enbrel.  She reports she feels her arthritis is worse than when she was on Humira.  She also reports that the medication makes her chest feel heavy and makes her feel short of breath 10 minutes after taking the medication.  She reports these feelings last at most 1 day.  She reports she took a dose last Friday, 06/25/16.  She denies any chest tightness of shortness of breath at this time.   I advised patient to stop Enbrel. Scheduled patient for follow up on Friday, 07/01/16, at 9:45 AM to discuss medications.    I also discussed with Dr. Estanislado Pandy who recommended the patient see her PCP regarding her symptoms.  I informed patient of this recommendation.  Patient reports she does not feel she needs to see her PCP.  I counseled patient on importance of ruling out any other causes of her symptoms.

## 2016-06-28 NOTE — Telephone Encounter (Signed)
Patient would like to speak to someone about the Enbrel she is taking. Patient states it is not helping her and she feels as though it is making her worse. Please call patient about symptoms and questions she is having.

## 2016-07-01 ENCOUNTER — Ambulatory Visit (INDEPENDENT_AMBULATORY_CARE_PROVIDER_SITE_OTHER): Payer: BLUE CROSS/BLUE SHIELD | Admitting: Allergy & Immunology

## 2016-07-01 ENCOUNTER — Encounter: Payer: Self-pay | Admitting: Allergy & Immunology

## 2016-07-01 VITALS — BP 120/75 | HR 67 | Temp 98.3°F | Resp 16 | Ht 62.21 in | Wt 228.6 lb

## 2016-07-01 DIAGNOSIS — M06062 Rheumatoid arthritis without rheumatoid factor, left knee: Secondary | ICD-10-CM | POA: Diagnosis not present

## 2016-07-01 DIAGNOSIS — T50905D Adverse effect of unspecified drugs, medicaments and biological substances, subsequent encounter: Secondary | ICD-10-CM

## 2016-07-01 DIAGNOSIS — J310 Chronic rhinitis: Secondary | ICD-10-CM | POA: Diagnosis not present

## 2016-07-01 DIAGNOSIS — T887XXD Unspecified adverse effect of drug or medicament, subsequent encounter: Secondary | ICD-10-CM | POA: Diagnosis not present

## 2016-07-01 NOTE — Progress Notes (Signed)
NEW PATIENT  Date of Service/Encounter:  07/01/16  Referring provider: [name], MD   Rheumatologist: Bo Merino, MD   Assessment:   Chronic rhinitis - likely allergic  Seronegative rheumatoid arthritis - on Enbrel  Adverse drug effect - likely not IgE allergic  Previous smoker  History of colon cancer   Plan/Recommendations:   1. Chronic rhinitis - likely allergic - We will get testing at the next visit (Wednesday May 16th at 8:30am). - Stop your antihistamines on Saturday to that they are out of your system by next week. - Stop the Flonase and start Dymista two sprays per nostril once daily. - The Dymista contains a nasal steroid and a nasal antihistamine.  - Samples of Allegra provided: one tablet daily as needed. - Samples of Pazeo provided: one drop per eye daily  2. Return in about 6 days (around 07/07/2016) at 8:30am.   Subjective:   Dana Hicks is a 41 y.o. female presenting today for evaluation of  Chief Complaint  Patient presents with  . Allergic Rhinitis   . Headache  . Nasal Congestion    Dana Hicks has a history of the following: Patient Active Problem List   Diagnosis Date Noted  . High risk medication use 02/06/2016  . Primary osteoarthritis of both knees 02/06/2016  . Family history of rheumatoid arthritis 02/06/2016  . H/O seasonal allergies 02/06/2016  . Contracture of elbow joint 02/03/2016  . Contracture, right elbow 02/03/2016  . Rheumatoid arthritis of multiple sites with negative rheumatoid factor ([place]) 02/03/2016  . History of colon cancer 02/03/2016  . HLA B27 (HLA B27 positive) 02/03/2016    History obtained from: chart review and patient.  AQUINNAH DEVIN was referred by [name], MD.     Dana Hicks is a 41 y.o. female with a history of rheumatoid arthritis (treated on Enbrel, which was switched from Humira; she will be switching back to Humira most likely due to lack of efficacy) presenting for  an allergy evaluation for chronic nasal congestion and runny eyes (although it is mostly the left eye). She endorses headaches and nasal congestion. Symptoms have been ongoing for years but she has never done anything about it. Around the end of 2017 they have become worse than previous years. She rarely gets treated with sinus infections, most recently in March 2018 shortly after starting Enbrel.   Dana Hicks has been treated her allergies with cetirizine, Flonase, and intermittent pseudofed which has worked until this past year. She has tried Xyzal without any improvement, therefore she switched back to Zyrtec. She has not tried Human resources officer recently. She has never been allergy tested. She does feels that the Dana Hicks works and she only uses it as needed. Worst times of the year are spring and fall although they do not resolve completely in the winter.    She is followed by Dr. Bo Merino for her RF-negative RA. She was diagnosed with RA somewhere around the year 2000. She was initially treated with recurrent courses of prednisone. She was changed to Humira and MTX with immediate improvement. She does have a history of colon cancer diagnosed at age 31 years. She would have immediate stomach pain after eating meals. She went to the PCP where she had a low hemoglobin. She had a colonoscopy that showed a lemon sized tumor in her colon. She had a PET scan that was thankfully negative. She has colonoscopies every five years.    She gets headache with penicillins and codeine.  She has a rash with sulfa antibiotics. Otherwise, there is no history of other atopic diseases, including asthma, drug allergies, food allergies, stinging insect allergies, or urticaria. There is no significant infectious history. Vaccinations are up to date.    Past Medical History: Patient Active Problem List   Diagnosis Date Noted  . High risk medication use 02/06/2016  . Primary osteoarthritis of both knees 02/06/2016  . Family  history of rheumatoid arthritis 02/06/2016  . H/O seasonal allergies 02/06/2016  . Contracture of elbow joint 02/03/2016  . Contracture, right elbow 02/03/2016  . Rheumatoid arthritis of multiple sites with negative rheumatoid factor ([place]) 02/03/2016  . History of colon cancer 02/03/2016  . HLA B27 (HLA B27 positive) 02/03/2016    Medication List:  Allergies as of 07/01/2016      Reactions   Codeine Other (See Comments)   headaches   Penicillins    Severe headaches   Bactrim [sulfamethoxazole-trimethoprim] Rash      Medication List       Accurate as of 07/01/16  9:29 AM. Always use your most recent med list.          azelastine 0.1 % nasal spray Commonly known as:  ASTELIN   cetirizine 10 MG tablet Commonly known as:  ZYRTEC Take 10 mg by mouth daily.   diclofenac sodium 1 % Gel Commonly known as:  VOLTAREN Apply 3 grams to 3 large joints up to 3 times daily   etanercept 50 MG/ML injection Commonly known as:  ENBREL SURECLICK Inject 1.93 mLs (50 mg total) into the skin once a week.   Fish Oil 1000 MG Caps Take by mouth.   fluticasone 50 MCG/ACT nasal spray Commonly known as:  FLONASE Flonase 50 mcg/actuation nasal spray,suspension  Inhale 2 sprays every day by intranasal route for 30 days.   folic acid 1 MG tablet Commonly known as:  FOLVITE   Methotrexate (PF) 20 MG/0.4ML Soaj Inject 1 pen into the skin once a week.   pseudoephedrine 30 MG tablet Commonly known as:  SUDAFED Take 30 mg by mouth every 4 (four) hours as needed for congestion.   TUBERCULIN SYR 1CC/27GX1/2" 27G X 1/2" 1 ML Misc BD Tuberculin Syringe 1 mL 27 x 1/2"       Birth History: non-contributory.   Developmental History: non-contributory.   Past Surgical History: Past Surgical History:  Procedure Laterality Date  . BREAST LUMPECTOMY WITH RADIOACTIVE SEED LOCALIZATION Left 06/28/2014   Procedure: RADIOACTIVE SEED LOCALIZATION LEFT BREAST LUMPECTOMY;  Surgeon: Excell Seltzer,  MD;  Location: [facility];  Service: General;  Laterality: Left;  . COLONOSCOPY  06/19/14  . PORT A CATH REVISION  2001   in and out  . RIGHT COLECTOMY  2001   cecum cancer     Family History: Family History  Problem Relation Age of Onset  . Arthritis/Rheumatoid Mother   . Allergic rhinitis Father   . Angioedema Neg Hx   . Asthma Neg Hx   . Atopy Neg Hx   . Eczema Neg Hx   . Immunodeficiency Neg Hx   . Urticaria Neg Hx      Social History: Kaityln lives at home with her daughter who is age 79 and her husband. She has a son as well who is in college getting a degree in machinery. She lives in a house that is 43 years old. She has laminate flooring throughout the home. She has gas heating and central cooling. There is one dog inside the  home. She does not have dust mite covers on the bedding. There is no tobacco exposure. She currently works as an Science writer. She did smoke for around 10 years years, but quit in the year 2000.    Review of Systems: a 14-point review of systems is pertinent for what is mentioned in HPI.  Otherwise, all other systems were negative. Constitutional: negative other than that listed in the HPI Eyes: negative other than that listed in the HPI Ears, nose, mouth, throat, and face: negative other than that listed in the HPI Respiratory: negative other than that listed in the HPI Cardiovascular: negative other than that listed in the HPI Gastrointestinal: negative other than that listed in the HPI Genitourinary: negative other than that listed in the HPI Integument: negative other than that listed in the HPI Hematologic: negative other than that listed in the HPI Musculoskeletal: negative other than that listed in the HPI Neurological: negative other than that listed in the HPI Allergy/Immunologic: negative other than that listed in the HPI    Objective:   Blood pressure 120/75, pulse 67, temperature 98.3  F (36.8 C), temperature source Oral, resp. rate 16, height 5' 2.21" (1.58 m), weight 228 lb 9.6 oz (103.7 kg), SpO2 97 %. Body mass index is 41.54 kg/m.   Physical Exam:  General: Alert, interactive, in no acute distress. Pleasant female. Very talkative.  Eyes: No conjunctival injection present on the right, No conjunctival injection present on the left, PERRL bilaterally, No discharge on the right, No discharge on the left, No Horner-Trantas dots present and allergic shiners present bilaterally Ears: Right TM pearly gray with normal light reflex, Left TM pearly gray with normal light reflex, Right TM intact without perforation and Left TM intact without perforation.  Nose/Throat: External nose within normal limits and septum midline, turbinates markedly edematous and pale with thick discharge, post-pharynx erythematous with cobblestoning in the posterior oropharynx. Tonsils 2+ without exudates Neck: Supple without thyromegaly.  Adenopathy: no enlarged lymph nodes appreciated in the anterior cervical, occipital, axillary, epitrochlear, inguinal, or popliteal regions Lungs: Clear to auscultation without wheezing, rhonchi or rales. No increased work of breathing. CV: Normal S1/S2, no murmurs. Capillary refill <2 seconds.  Abdomen: Nondistended, nontender. No guarding or rebound tenderness. Bowel sounds present in all fields and hyperactive  Skin: Warm and dry, without lesions or rashes. Extremities:  No clubbing, cyanosis or edema. Neuro:   Grossly intact. No focal deficits appreciated. Responsive to questions.   Diagnostic studies: none (patient took Zyrtec yesterday)     Salvatore Marvel, MD [facility]

## 2016-07-01 NOTE — Patient Instructions (Addendum)
1. Chronic rhinitis - We will get testing at the next visit (Wednesday May 16th at 8:30am). - Stop your antihistamines on Saturday to that they are out of your system by next week. - Stop the Flonase and start Dymista two sprays per nostril once daily. - The Dymista contains a nasal steroid and a nasal antihistamine.  - Samples of Allegra provided: one tablet daily as needed. - Samples of Pazeo provided: one drop per eye daily  2. Return in about 6 days (around 07/07/2016) at 8:30am.  Please inform us of any Emergency Department visits, hospitalizations, or changes in symptoms. Call us before going to the ED for breathing or allergy symptoms since we might be able to fit you in for a sick visit. Feel free to contact us anytime with any questions, problems, or concerns.  It was a pleasure to meet you today! Happy spring!   Websites that have reliable patient information: 1. American Academy of Asthma, Allergy, and Immunology: www.aaaai.org 2. Food Allergy Research and Education (FARE): foodallergy.org 3. Mothers of Asthmatics: http://www.asthmacommunitynetwork.org 4. American College of Allergy, Asthma, and Immunology: www.acaai.org

## 2016-07-02 ENCOUNTER — Encounter: Payer: Self-pay | Admitting: Rheumatology

## 2016-07-02 ENCOUNTER — Ambulatory Visit (INDEPENDENT_AMBULATORY_CARE_PROVIDER_SITE_OTHER): Payer: BLUE CROSS/BLUE SHIELD | Admitting: Rheumatology

## 2016-07-02 ENCOUNTER — Telehealth: Payer: Self-pay

## 2016-07-02 VITALS — BP 130/70 | HR 78 | Resp 16 | Wt 228.0 lb

## 2016-07-02 DIAGNOSIS — Z85038 Personal history of other malignant neoplasm of large intestine: Secondary | ICD-10-CM

## 2016-07-02 DIAGNOSIS — M1711 Unilateral primary osteoarthritis, right knee: Secondary | ICD-10-CM | POA: Diagnosis not present

## 2016-07-02 DIAGNOSIS — M1712 Unilateral primary osteoarthritis, left knee: Secondary | ICD-10-CM

## 2016-07-02 DIAGNOSIS — Z1589 Genetic susceptibility to other disease: Secondary | ICD-10-CM

## 2016-07-02 DIAGNOSIS — Z79899 Other long term (current) drug therapy: Secondary | ICD-10-CM

## 2016-07-02 DIAGNOSIS — M170 Bilateral primary osteoarthritis of knee: Secondary | ICD-10-CM

## 2016-07-02 DIAGNOSIS — M0609 Rheumatoid arthritis without rheumatoid factor, multiple sites: Secondary | ICD-10-CM

## 2016-07-02 LAB — CBC WITH DIFFERENTIAL/PLATELET
Basophils Absolute: 0 cells/uL (ref 0–200)
Basophils Relative: 0 %
Eosinophils Absolute: 68 cells/uL (ref 15–500)
Eosinophils Relative: 1 %
HCT: 40.9 % (ref 35.0–45.0)
Hemoglobin: 13.1 g/dL (ref 11.7–15.5)
Lymphocytes Relative: 34 %
Lymphs Abs: 2312 cells/uL (ref 850–3900)
MCH: 30.9 pg (ref 27.0–33.0)
MCHC: 32 g/dL (ref 32.0–36.0)
MCV: 96.5 fL (ref 80.0–100.0)
MPV: 11.2 fL (ref 7.5–12.5)
Monocytes Absolute: 544 cells/uL (ref 200–950)
Monocytes Relative: 8 %
Neutro Abs: 3876 cells/uL (ref 1500–7800)
Neutrophils Relative %: 57 %
Platelets: 295 10*3/uL (ref 140–400)
RBC: 4.24 MIL/uL (ref 3.80–5.10)
RDW: 14 % (ref 11.0–15.0)
WBC: 6.8 10*3/uL (ref 3.8–10.8)

## 2016-07-02 LAB — COMPLETE METABOLIC PANEL WITH GFR
ALT: 14 U/L (ref 6–29)
AST: 12 U/L (ref 10–30)
Albumin: 4.1 g/dL (ref 3.6–5.1)
Alkaline Phosphatase: 37 U/L (ref 33–115)
BUN: 9 mg/dL (ref 7–25)
CO2: 26 mmol/L (ref 20–31)
Calcium: 8.9 mg/dL (ref 8.6–10.2)
Chloride: 104 mmol/L (ref 98–110)
Creat: 0.65 mg/dL (ref 0.50–1.10)
GFR, Est African American: >89 mL/min (ref >=60)
GFR, Est Non African American: >89 mL/min (ref >=60)
Glucose, Bld: 85 mg/dL (ref 65–99)
Potassium: 4.5 mmol/L (ref 3.5–5.3)
Sodium: 139 mmol/L (ref 135–146)
Total Bilirubin: 0.4 mg/dL (ref 0.2–1.2)
Total Protein: 6.2 g/dL (ref 6.1–8.1)

## 2016-07-02 MED ORDER — ABATACEPT 125 MG/ML ~~LOC~~ SOAJ: 125.0000 mg | SUBCUTANEOUS | 2 refills | Status: DC

## 2016-07-02 MED ORDER — TRIAMCINOLONE ACETONIDE 40 MG/ML IJ SUSP
40.0000 mg | INTRAMUSCULAR | Status: AC | PRN
  Administered 2016-07-02: 40 mg via INTRA_ARTICULAR

## 2016-07-02 MED ORDER — LIDOCAINE HCL 1 % IJ SOLN
1.5000 mL | INTRAMUSCULAR | Status: AC | PRN
  Administered 2016-07-02: 1.5 mL

## 2016-07-02 NOTE — Progress Notes (Addendum)
Pharmacy Note  Subjective: Patient presents today to the [facility] to see Dr. Estanislado Pandy.  Patient is currently on Enbrel 50 mg weekly and methotrexate 20 mg weekly and folic acid 2 mg daily.  Enbrel was discontinued due to adverse events.  Patient seen by the pharmacist for counseling on subcutaneous Orencia.    Objective: CBC    Component Value Date/Time   WBC 6.5 04/21/2016 1656   RBC 4.28 04/21/2016 1656   HGB 13.0 04/21/2016 1656   HGB 13.0 05/15/2009 1616   HCT 40.3 04/21/2016 1656   HCT 38.0 05/15/2009 1616   PLT 276 04/21/2016 1656   PLT 215 05/15/2009 1616   MCV 94.2 04/21/2016 1656   MCV 92.7 05/15/2009 1616   MCH 30.4 04/21/2016 1656   MCHC 32.3 04/21/2016 1656   RDW 14.2 04/21/2016 1656   RDW 13.6 05/15/2009 1616   LYMPHSABS 2,210 04/21/2016 1656   LYMPHSABS 2.2 05/15/2009 1616   MONOABS 585 04/21/2016 1656   MONOABS 0.5 05/15/2009 1616   EOSABS 65 04/21/2016 1656   EOSABS 0.1 05/15/2009 1616   BASOSABS 65 04/21/2016 1656   BASOSABS 0.0 05/15/2009 1616   CMP     Component Value Date/Time   NA 137 04/21/2016 1656   K 3.7 04/21/2016 1656   CL 102 04/21/2016 1656   CO2 19 (L) 04/21/2016 1656   GLUCOSE 106 (H) 04/21/2016 1656   BUN 9 04/21/2016 1656   CREATININE 0.72 04/21/2016 1656   CALCIUM 9.2 04/21/2016 1656   PROT 6.3 04/21/2016 1656   ALBUMIN 4.0 04/21/2016 1656   AST 15 04/21/2016 1656   ALT 16 04/21/2016 1656   ALKPHOS 33 04/21/2016 1656   BILITOT 0.5 04/21/2016 1656   GFRNONAA >89 04/21/2016 1656   GFRAA >89 04/21/2016 1656   TB Gold: negative (08/07/15) Hepatitis panel: negative (12/06/2005)  Assessment/Plan:  Counseled patient that Orencia is a selective T-cell costimulation blocker indicated for rheumatoid arthritis.  Counseled patient on purpose, proper use, and adverse effects of Orencia. The most common adverse effects are increased risk of infections, headache, and injection reactions.  There is the possibility of an  increased risk of malignancy but it is not well understood if this increased risk is due to the medication or the disease state.  Provided patient with medication education material and answered all questions.  Patient consented to [facility].  Will upload consent into patient's chart.  Will apply for Orencia subcutaneous injections through patient's insurance.  Reviewed storage information for Orencia.  Advised initial injection must be administered in office.  Initial Orencia injection must be at least one week after most recent Enbrel injection.  Patient voiced understanding.   Most recent standing labs were 04/21/16.  Patient will be due for standing labs this month.  Patient wants to go ahead and get standing labs today.  Most recent TB Gold negative 08/07/15. Patient will be due for TB Gold in June 2018.  Patient will also need standing labs one month after starting Orencia.  Will plan to get TB Gold with those standing labs.     [name], Pharm.D., BCPS Clinical Pharmacist Pager: [phone] Phone: [phone] 07/02/2016 10:51 AM

## 2016-07-02 NOTE — Telephone Encounter (Signed)
Submitted a prior authorization for Orencia 125mg /ml [employer] through cover my meds. Received an approval conformation. Patient has been approved from 07/02/16 through 07/02/17.  Case ID: [id number]  Demetrios Loll, CPhT 12:29 PM

## 2016-07-02 NOTE — Telephone Encounter (Signed)
I informed patient of Orencia approval.  Orencia prescription was sent to patient's specialty pharmacy.  Will request for initial fill be delivered to the office for clinic administration.  Patient reports most recent Enbrel injection was 06/28/16.  First Orencia injection must be at least 1 week after last Enbrel.  Patient wanted to schedule nurse visit for 07/06/16 at 9 AM for initial Orencia injection.    Provided patient with Orencia coupon card information:  ID [id number] RxGRP [id number] BIN [id number] [place] patient to activate card at [phone].    Patient denies any further questions or concerns at this time.   [name], Pharm.D., BCPS, CPP Clinical Pharmacist Pager: [phone] Phone: [phone] 07/02/2016 1:58 PM

## 2016-07-02 NOTE — Patient Instructions (Signed)
Abatacept solution for injection (subcutaneous or intravenous use)  What is this medicine?  ABATACEPT (a ba TA sept) is used to treat moderate to severe active rheumatoid arthritis or psoriatic arthritis in adults. This medicine is also used to treat juvenile idiopathic arthritis.  This medicine may be used for other purposes; ask your health care provider or pharmacist if you have questions.  COMMON BRAND NAME(S): Orencia  What should I tell my health care provider before I take this medicine?  They need to know if you have any of these conditions:  -are taking other medicines to treat rheumatoid arthritis  -COPD  -diabetes  -infection or history of infections  -recently received or scheduled to receive a vaccine  -scheduled to have surgery  -tuberculosis, a positive skin test for tuberculosis or have recently been in close contact with someone who has tuberculosis  -viral hepatitis  -an unusual or allergic reaction to abatacept, other medicines, foods, dyes, or preservatives  -pregnant or trying to get pregnant  -breast-feeding  How should I use this medicine?  This medicine is for infusion into a vein or for injection under the skin. Infusions are given by a health care professional in a hospital or clinic setting. If you are to give your own medicine at home, you will be taught how to prepare and give this medicine under the skin. Use exactly as directed. Take your medicine at regular intervals. Do not take your medicine more often than directed.  It is important that you put your used needles and syringes in a special sharps container. Do not put them in a trash can. If you do not have a sharps container, call your pharmacist or healthcare provider to get one.  Talk to your pediatrician regarding the use of this medicine in children. While infusions in a clinic may be prescribed for children as young as 2 years for selected conditions, precautions do apply.  Overdosage: If you think you have taken too much of  this medicine contact a poison control center or emergency room at once.  NOTE: This medicine is only for you. Do not share this medicine with others.  What if I miss a dose?  This medicine is used once a week if given by injection under the skin. If you miss a dose, take it as soon as you can. If it is almost time for your next dose, take only that dose. Do not take double or extra doses.  If you are to be given an infusion, it is important not to miss your dose. Doses are usually every 4 weeks. Call your doctor or health care professional if you are unable to keep an appointment.  What may interact with this medicine?  Do not take this medicine with any of the following medications:  -adalimumab  -anakinra  -certolizumab  -etanercept  -golimumab  -infliximab  -live virus vaccines  -rituximab  -tocilizumab  This medicine may also interact with the following medications:  -vaccines  This list may not describe all possible interactions. Give your health care provider a list of all the medicines, herbs, non-prescription drugs, or dietary supplements you use. Also tell them if you smoke, drink alcohol, or use illegal drugs. Some items may interact with your medicine.  What should I watch for while using this medicine?  Visit your doctor for regular check ups while you are taking this medicine. Tell your doctor or healthcare professional if your symptoms do not start to get better or if they   get worse.  Call your doctor or health care professional if you get a cold or other infection while receiving this medicine. Do not treat yourself. This medicine may decrease your body's ability to fight infection. Try to avoid being around people who are sick.  What side effects may I notice from receiving this medicine?  Side effects that you should report to your doctor or health care professional as soon as possible:  -allergic reactions like skin rash, itching or hives, swelling of the face, lips, or tongue  -breathing  problems  -chest pain  -signs of infection - fever or chills, cough, unusual tiredness, pain or trouble passing urine, or warm, red or painful skin  Side effects that usually do not require medical attention (report to your doctor or health care professional if they continue or are bothersome):  -dizziness  -headache  -nausea, vomiting  -sore throat  -stomach upset  This list may not describe all possible side effects. Call your doctor for medical advice about side effects. You may report side effects to FDA at 1-800-FDA-1088.  Where should I keep my medicine?  Infusions will be given in a hospital or clinic and will not be stored at home.  Storage for syringes given under the skin and stored at home:  Keep out of the reach of children. Store in a refrigerator between 2 and 8 degrees C (36 and 46 degrees F). Keep this medicine in the original container. Protect from light. Do not freeze. Throw away any unused medicine after the expiration date.  NOTE: This sheet is a summary. It may not cover all possible information. If you have questions about this medicine, talk to your doctor, pharmacist, or health care provider.   2018 Elsevier/Gold Standard (2015-08-28 10:07:35)

## 2016-07-02 NOTE — Addendum Note (Signed)
Addended by: Cyndia Skeeters on: 07/02/2016 02:33 PM   Modules accepted: Orders

## 2016-07-03 NOTE — Progress Notes (Signed)
WNL

## 2016-07-05 ENCOUNTER — Telehealth: Payer: Self-pay | Admitting: Rheumatology

## 2016-07-05 NOTE — Telephone Encounter (Signed)
Patient reports the pharmacy is still processing her prescription and it will not be delivered by 07/06/16 for her nurse visit.  I advised patient we can use Orencia sample for her initial dose tomorrow.  We can then schedule her prescription to be delivered to her home instead of to the clinic.    [name], Pharm.D., BCPS, CPP Clinical Pharmacist Pager: [phone] Phone: [phone] 07/05/2016 11:44 AM

## 2016-07-05 NOTE — Telephone Encounter (Signed)
Patient request a call back from Dr. Koleen Nimrod in reference to her Orencia injection. Patient is calling Pharmacy to see if they can air mail it to [place]. Patient also wants to know if we could [place] a sample injection, and she replace it with hers when it comes. Please call patient to discuss.

## 2016-07-06 ENCOUNTER — Ambulatory Visit (INDEPENDENT_AMBULATORY_CARE_PROVIDER_SITE_OTHER): Payer: BLUE CROSS/BLUE SHIELD | Admitting: *Deleted

## 2016-07-06 VITALS — BP 160/103 | HR 68

## 2016-07-06 DIAGNOSIS — M0579 Rheumatoid arthritis with rheumatoid factor of multiple sites without organ or systems involvement: Secondary | ICD-10-CM

## 2016-07-06 MED ORDER — ABATACEPT 125 MG/ML ~~LOC~~ SOAJ
125.0000 mg | Freq: Once | SUBCUTANEOUS | Status: AC
  Administered 2016-07-06: 125 mg via SUBCUTANEOUS

## 2016-07-06 NOTE — Progress Notes (Signed)
Pharmacy Note  Subjective:   Patient is being initiated on Orencia.  Patient was previously counseled extensively on Orencia on 07/02/2016 and consented to initiation of Orencia at that time.  Patient presents to clinic today to receive the first dose of Orencia.  Patient confirms it has been over a week since her last Enbrel injection.    Objective: CMP     Component Value Date/Time   NA 139 07/02/2016 1125   K 4.5 07/02/2016 1125   CL 104 07/02/2016 1125   CO2 26 07/02/2016 1125   GLUCOSE 85 07/02/2016 1125   BUN 9 07/02/2016 1125   CREATININE 0.65 07/02/2016 1125   CALCIUM 8.9 07/02/2016 1125   PROT 6.2 07/02/2016 1125   ALBUMIN 4.1 07/02/2016 1125   AST 12 07/02/2016 1125   ALT 14 07/02/2016 1125   ALKPHOS 37 07/02/2016 1125   BILITOT 0.4 07/02/2016 1125   GFRNONAA >89 07/02/2016 1125   GFRAA >89 07/02/2016 1125   CBC    Component Value Date/Time   WBC 6.8 07/02/2016 1125   RBC 4.24 07/02/2016 1125   HGB 13.1 07/02/2016 1125   HGB 13.0 05/15/2009 1616   HCT 40.9 07/02/2016 1125   HCT 38.0 05/15/2009 1616   PLT 295 07/02/2016 1125   PLT 215 05/15/2009 1616   MCV 96.5 07/02/2016 1125   MCV 92.7 05/15/2009 1616   MCH 30.9 07/02/2016 1125   MCHC 32.0 07/02/2016 1125   RDW 14.0 07/02/2016 1125   RDW 13.6 05/15/2009 1616   LYMPHSABS 2,312 07/02/2016 1125   LYMPHSABS 2.2 05/15/2009 1616   MONOABS 544 07/02/2016 1125   MONOABS 0.5 05/15/2009 1616   EOSABS 68 07/02/2016 1125   EOSABS 0.1 05/15/2009 1616   BASOSABS 0 07/02/2016 1125   BASOSABS 0.0 05/15/2009 1616   TB Gold: negative (08/07/15)  Assessment/Plan:  Counseled patient on purpose, proper use, and adverse effects of Orencia.  Patient was counseled on how to administer subcutaneous Orencia injection using a demonstration pen.  Orencia was administered in the left thigh using an Orencia sample pen (Lot: [id number], Exp. 08/2016).  Patient was monitored for 30 minutes post injection.  Patient will need standing lab  orders in one month.  Patient is also due for annual TB Gold in one month.  Provided patient with standing lab instructions and placed standing lab order.  Patient has follow up visit scheduled for 10/01/16.    I called [facility] during today's visit.  Provided permission to deliver initial Orencia injection to patient's home instead of the clinic as patient had initial Orencia injection in the office today.  Patient was able to schedule delivery of her [place] prescription for Friday 07/09/16.    [name], Pharm.D., BCPS Clinical Pharmacist Pager: [phone] Phone: [phone] 07/06/2016 9:14 AM

## 2016-07-06 NOTE — Progress Notes (Signed)
Patient in office for initial start to [place]. Patient is switching form Enbrel to Orencia. Patient confirms it has been more than 1 week since her last Enbrel. Patient given Orencia in her left thigh and monitored in office for 30 minutes after administration for adverse reactions. No adverse reactions noted.   Administrations This Visit    Abatacept SOAJ 125 mg    Admin Date 07/06/2016 Action Given Dose 125 mg Route Subcutaneous Administered By Carole Binning, LPN

## 2016-07-06 NOTE — Patient Instructions (Addendum)
Standing Labs We placed an order today for your standing lab work.    Please come back and get your standing labs in 1 month.  You will also be due for your yearly TB Gold with your next labs.    We have open lab Monday through Friday from 8:30-11:30 AM and 1:30-4 PM at the office of Dr. Tresa Moore, PA.   The office is located at [address] [place], [place], [place] [address] No appointment is necessary.   Labs are drawn by [facility].  You may receive a bill from [facility] for your lab work.     Abatacept solution for injection (subcutaneous or intravenous use) What is this medicine? ABATACEPT (a ba TA sept) is used to treat moderate to severe active rheumatoid arthritis or psoriatic arthritis in adults. This medicine is also used to treat juvenile idiopathic arthritis. This medicine may be used for other purposes; ask your health care provider or pharmacist if you have questions. COMMON BRAND NAME(S): Orencia What should I tell my health care provider before I take this medicine? They need to know if you have any of these conditions: -are taking other medicines to treat rheumatoid arthritis -COPD -diabetes -infection or history of infections -recently received or scheduled to receive a vaccine -scheduled to have surgery -tuberculosis, a positive skin test for tuberculosis or have recently been in close contact with someone who has tuberculosis -viral hepatitis -an unusual or allergic reaction to abatacept, other medicines, foods, dyes, or preservatives -pregnant or trying to get pregnant -breast-feeding How should I use this medicine? This medicine is for infusion into a vein or for injection under the skin. Infusions are given by a health care professional in a hospital or clinic setting. If you are to give your own medicine at home, you will be taught how to prepare and give this medicine under the skin. Use exactly as directed. Take your medicine at regular  intervals. Do not take your medicine more often than directed. It is important that you put your used needles and syringes in a special sharps container. Do not put them in a trash can. If you do not have a sharps container, call your pharmacist or healthcare provider to get one. Talk to your pediatrician regarding the use of this medicine in children. While infusions in a clinic may be prescribed for children as young as 2 years for selected conditions, precautions do apply. Overdosage: If you think you have taken too much of this medicine contact a poison control center or emergency room at once. NOTE: This medicine is only for you. Do not share this medicine with others. What if I miss a dose? This medicine is used once a week if given by injection under the skin. If you miss a dose, take it as soon as you can. If it is almost time for your next dose, take only that dose. Do not take double or extra doses. If you are to be given an infusion, it is important not to miss your dose. Doses are usually every 4 weeks. Call your doctor or health care professional if you are unable to keep an appointment. What may interact with this medicine? Do not take this medicine with any of the following medications: -adalimumab -anakinra -certolizumab -etanercept -golimumab -infliximab -live virus vaccines -rituximab -tocilizumab This medicine may also interact with the following medications: -vaccines This list may not describe all possible interactions. Give your health care provider a list of all the medicines, herbs,  non-prescription drugs, or dietary supplements you use. Also tell them if you smoke, drink alcohol, or use illegal drugs. Some items may interact with your medicine. What should I watch for while using this medicine? Visit your doctor for regular check ups while you are taking this medicine. Tell your doctor or healthcare professional if your symptoms do not start to get better or if they  get worse. Call your doctor or health care professional if you get a cold or other infection while receiving this medicine. Do not treat yourself. This medicine may decrease your body's ability to fight infection. Try to avoid being around people who are sick. What side effects may I notice from receiving this medicine? Side effects that you should report to your doctor or health care professional as soon as possible: -allergic reactions like skin rash, itching or hives, swelling of the face, lips, or tongue -breathing problems -chest pain -signs of infection - fever or chills, cough, unusual tiredness, pain or trouble passing urine, or warm, red or painful skin Side effects that usually do not require medical attention (report to your doctor or health care professional if they continue or are bothersome): -dizziness -headache -nausea, vomiting -sore throat -stomach upset This list may not describe all possible side effects. Call your doctor for medical advice about side effects. You may report side effects to FDA at 1-800-FDA-1088. Where should I keep my medicine? Infusions will be given in a hospital or clinic and will not be stored at home. Storage for syringes given under the skin and stored at home: Keep out of the reach of children. Store in a refrigerator between 2 and 8 degrees C (36 and 46 degrees F). Keep this medicine in the original container. Protect from light. Do not freeze. Throw away any unused medicine after the expiration date. NOTE: This sheet is a summary. It may not cover all possible information. If you have questions about this medicine, talk to your doctor, pharmacist, or health care provider.  2018 Elsevier/Gold Standard (2015-08-28 10:07:35)

## 2016-07-07 ENCOUNTER — Ambulatory Visit (INDEPENDENT_AMBULATORY_CARE_PROVIDER_SITE_OTHER): Payer: BLUE CROSS/BLUE SHIELD | Admitting: Allergy & Immunology

## 2016-07-07 DIAGNOSIS — M06062 Rheumatoid arthritis without rheumatoid factor, left knee: Secondary | ICD-10-CM

## 2016-07-07 DIAGNOSIS — J301 Allergic rhinitis due to pollen: Secondary | ICD-10-CM | POA: Diagnosis not present

## 2016-07-07 NOTE — Patient Instructions (Addendum)
1. Chronic rhinitis - Testing today showed: positives to grasses, trees, ragweed, molds - Avoidance measures provided.  - We will try to add on a blood allergy test to your blood that is already in the lab (if they have saved it).  - Continue with Dymista two sprays per nostril once daily. - Continue with Allegra one tablet daily as needed. - Sample of Bepreve eye drops provided: one drop per eye twice daily as needed. - Consider starting allergy shots for more improved long term control. - Call your insurance company to make sure that you are fine with the expected copayments. - Call us back when you make a decision.   2. Return in about 6 months (around 01/07/2017).  Please inform us of any Emergency Department visits, hospitalizations, or changes in symptoms. Call us before going to the ED for breathing or allergy symptoms since we might be able to fit you in for a sick visit. Feel free to contact us anytime with any questions, problems, or concerns.  It was a pleasure to see you again today! Happy spring!   Websites that have reliable patient information: 1. American Academy of Asthma, Allergy, and Immunology: www.aaaai.org 2. Food Allergy Research and Education (FARE): foodallergy.org 3. Mothers of Asthmatics: http://www.asthmacommunitynetwork.org 4. American College of Allergy, Asthma, and Immunology: www.acaai.org  Reducing Pollen Exposure  The American Academy of Allergy, Asthma and Immunology suggests the following steps to reduce your exposure to pollen during allergy seasons.    1. Do not hang sheets or clothing out to dry; pollen may collect on these items. 2. Do not mow lawns or spend time around freshly cut grass; mowing stirs up pollen. 3. Keep windows closed at night.  Keep car windows closed while driving. 4. Minimize morning activities outdoors, a time when pollen counts are usually at their highest. 5. Stay indoors as much as possible when pollen counts or humidity  is high and on windy days when pollen tends to remain in the air longer. 6. Use air conditioning when possible.  Many air conditioners have filters that trap the pollen spores. 7. Use a HEPA room air filter to remove pollen form the indoor air you breathe.  Control of Mold Allergen  Mold and fungi can grow on a variety of surfaces provided certain temperature and moisture conditions exist.  Outdoor molds grow on plants, decaying vegetation and soil.  The major outdoor mold, Alternaria and Cladosporium, are found in very high numbers during hot and dry conditions.  Generally, a late Summer - Fall peak is seen for common outdoor fungal spores.  Rain will temporarily lower outdoor mold spore count, but counts rise rapidly when the rainy period ends.  The most important indoor molds are Aspergillus and Penicillium.  Dark, humid and poorly ventilated basements are ideal sites for mold growth.  The next most common sites of mold growth are the bathroom and the kitchen.  Outdoor [employer] 1. Use air conditioning and keep windows closed 2. Avoid exposure to decaying vegetation. 3. Avoid leaf raking. 4. Avoid grain handling. 5. Consider wearing a face mask if working in moldy areas.  Indoor Mold Control 1. Maintain humidity below 50%. 2. Clean washable surfaces with 5% bleach solution. 3. Remove sources e.g. contaminated carpets.

## 2016-07-07 NOTE — Progress Notes (Signed)
FOLLOW UP  Date of Service/Encounter:  07/07/16   Assessment:   Non-seasonal allergic rhinitis due to pollen  Rheumatoid arthritis involving left knee with negative rheumatoid factor (HCC)   Plan/Recommendations:   1. Chronic rhinitis - Testing today showed: positives to grasses, trees, ragweed, molds - Avoidance measures provided.  - We will try to add on a blood allergy test to your blood that is already in the lab (if they have saved it).  - Continue with Dymista two sprays per nostril once daily. - Continue with Allegra one tablet daily as needed. - Sample of Bepreve eye drops provided: one drop per eye twice daily as needed. - Consider starting allergy shots for more improved long term control. - Call your insurance company to make sure that you are fine with the expected copayments. - Call us back when you make a decision.   2. Return in about 6 months (around 01/07/2017).  Subjective:   Dana Hicks is a 41 y.o. female presenting today for follow up of No chief complaint on file.   Dana Hicks has a history of the following: Patient Active Problem List   Diagnosis Date Noted  . High risk medication use 02/06/2016  . Primary osteoarthritis of both knees 02/06/2016  . Family history of rheumatoid arthritis 02/06/2016  . H/O seasonal allergies 02/06/2016  . Contracture of elbow joint 02/03/2016  . Contracture, right elbow 02/03/2016  . Rheumatoid arthritis of multiple sites with negative rheumatoid factor ([place]) 02/03/2016  . History of colon cancer 02/03/2016  . HLA B27 (HLA B27 positive) 02/03/2016    History obtained from: chart review and patient.  Dana Hicks was referred by [name], MD.     Dana Hicks is a 41 y.o. female presenting for a follow up visit. We last sew her in May 10th, at which time she had taken her antihistamines and we were unable to do skin testing. She has a history of allergic rhinitis symptoms, including nasal  rhinitis. We started Dymista two sprays per nostril once daily. We provided samples of Allegra as well as [place]. She presents today for skin testing.   Since last visit, she has done well. She recently changed from Enbrel to [place], first injection given yesterday. She tolerated this well and is optimistic that it will help her rheumatoid arthritis. She remains on the Dymista 2 sprays per nostril once daily and does feel some relief from this. She has been off of her antihistamines for 3 days. She did try the Pazeo eye drops but did not feel much relief from this.    Otherwise, there have been no changes to her past medical history, surgical history, family history, or social history.    Review of Systems: a 14-point review of systems is pertinent for what is mentioned in HPI.  Otherwise, all other systems were negative. Constitutional: negative other than that listed in the HPI Eyes: negative other than that listed in the HPI Ears, nose, mouth, throat, and face: negative other than that listed in the HPI Respiratory: negative other than that listed in the HPI Cardiovascular: negative other than that listed in the HPI Gastrointestinal: negative other than that listed in the HPI Genitourinary: negative other than that listed in the HPI Integument: negative other than that listed in the HPI Hematologic: negative other than that listed in the HPI Musculoskeletal: negative other than that listed in the HPI Neurological: negative other than that listed in the HPI Allergy/Immunologic: negative other than  that listed in the HPI    Objective:   Last menstrual period 06/25/2016. There is no height or weight on file to calculate BMI.   Physical Exam: deferred since we were just performing testing    Diagnostic studies:   Allergy Studies:   Indoor/Outdoor Percutaneous Adult Environmental Panel: positive to bahia grass, [place] grass, johnson grass, [place] blue grass, meadow fescue  grass, perennial rye grass, sweet vernal grass, timothy grass, ash, hickory, pecan pollen and black walnut pollen. Otherwise negative with adequate controls.  Indoor/Outdoor Selected Intradermal Environmental Panel: positive to ragweed mix, mold mix #2 and mold mix #3. Otherwise negative with adequate controls.     Salvatore Marvel, MD [facility]

## 2016-07-16 ENCOUNTER — Ambulatory Visit: Payer: BLUE CROSS/BLUE SHIELD | Admitting: Rheumatology

## 2016-07-27 DIAGNOSIS — J0100 Acute maxillary sinusitis, unspecified: Secondary | ICD-10-CM | POA: Diagnosis not present

## 2016-07-27 NOTE — Progress Notes (Signed)
IT script written and routed to the IT Team.  Salvatore Marvel, MD [facility]

## 2016-07-27 NOTE — Addendum Note (Signed)
Addended by: Valentina Shaggy on: 07/27/2016 02:59 PM   Modules accepted: Orders

## 2016-07-28 NOTE — Progress Notes (Signed)
Vials to be made 07-28-16 jm

## 2016-07-29 DIAGNOSIS — J301 Allergic rhinitis due to pollen: Secondary | ICD-10-CM | POA: Diagnosis not present

## 2016-07-30 DIAGNOSIS — J3089 Other allergic rhinitis: Secondary | ICD-10-CM | POA: Diagnosis not present

## 2016-08-04 DIAGNOSIS — J0100 Acute maxillary sinusitis, unspecified: Secondary | ICD-10-CM | POA: Diagnosis not present

## 2016-08-05 ENCOUNTER — Ambulatory Visit: Payer: BLUE CROSS/BLUE SHIELD

## 2016-08-31 ENCOUNTER — Other Ambulatory Visit: Payer: Self-pay | Admitting: *Deleted

## 2016-08-31 MED ORDER — METHOTREXATE (PF) 20 MG/0.4ML ~~LOC~~ SOAJ: 1.0000 "pen " | SUBCUTANEOUS | 0 refills | Status: DC

## 2016-08-31 NOTE — Telephone Encounter (Signed)
Refill request received via fax  Last Visit: 07/02/16 Next Visit: 10/01/16 Labs: 07/02/16 WNL  Okay to refill per Dr Estanislado Pandy

## 2016-09-02 ENCOUNTER — Other Ambulatory Visit: Payer: Self-pay | Admitting: *Deleted

## 2016-09-02 ENCOUNTER — Ambulatory Visit (INDEPENDENT_AMBULATORY_CARE_PROVIDER_SITE_OTHER): Payer: BLUE CROSS/BLUE SHIELD | Admitting: *Deleted

## 2016-09-02 DIAGNOSIS — J309 Allergic rhinitis, unspecified: Secondary | ICD-10-CM | POA: Diagnosis not present

## 2016-09-02 MED ORDER — BEPOTASTINE BESILATE 1.5 % OP SOLN: 1.0000 [drp] | Freq: Two times a day (BID) | OPHTHALMIC | 3 refills | Status: DC | PRN

## 2016-09-02 MED ORDER — EPINEPHRINE 0.3 MG/0.3ML IJ SOAJ: 0.3000 mg | Freq: Once | INTRAMUSCULAR | 0 refills | Status: AC

## 2016-09-02 NOTE — Progress Notes (Signed)
Immunotherapy   Patient Details  Name: Dana Hicks MRN: [id number] Date of Birth: 12-01-1975  09/02/2016  [name] started injections for  [employer]. Following schedule: B  Frequency:2 times per week Epi-Pen:Epi-Pen Available  Consent signed and patient instructions given. No problems in office after 30 minutes.    Constance Holster 09/02/2016, 6:41 PM

## 2016-09-03 ENCOUNTER — Other Ambulatory Visit: Payer: Self-pay | Admitting: Allergy & Immunology

## 2016-09-03 NOTE — Telephone Encounter (Signed)
Patient called and said Dr. Ernst Bowler gave her a sample of Dymista and called her in [place]. She would like the Dymista called in, but wants to know if there is an alternative for Bepreve because it is $400 and insurance won't pay for it. She said unless a prior authorization is sent for it. She has Rheumatoid Arthritis and said it is hard to find anything that helps her allergies. She said Bepreve does, but it is too expensive.

## 2016-09-03 NOTE — Telephone Encounter (Signed)
Dr. Ernst Bowler is their any other eye drops that she can try?

## 2016-09-03 NOTE — Telephone Encounter (Signed)
Spoke to patient and informed her that Dr. Ernst Bowler was out of the office and that I would give her a sample of the medication until he returns to the office.

## 2016-09-06 MED ORDER — KETOTIFEN FUMARATE 0.025 % OP SOLN: 1.0000 [drp] | Freq: Two times a day (BID) | OPHTHALMIC | 3 refills | Status: DC

## 2016-09-06 NOTE — Telephone Encounter (Signed)
That's fine - let's send in azelastine eye drops one drop per eye BID PRN.  Thanks, Salvatore Marvel, MD [place] [facility] [place]

## 2016-09-06 NOTE — Telephone Encounter (Signed)
Sure - let's try ketotifen eye drops instead (one drop per eye twice daily). She failed [place] already, so if she fails this we will work on a [place] for [employer].  Salvatore Marvel, MD [place] [facility] [place]

## 2016-09-06 NOTE — Telephone Encounter (Signed)
Called patient. Left message for patient to call back. We needs to inform patient that we sent [facility] drops to [facility] [address].

## 2016-09-06 NOTE — Telephone Encounter (Signed)
Spoke with pharmacy and they stated that she needs to try Azelastine eye drops before they would pay for Bepreve.

## 2016-09-07 ENCOUNTER — Other Ambulatory Visit: Payer: Self-pay

## 2016-09-07 MED ORDER — AZELASTINE HCL 0.05 % OP SOLN
1.0000 [drp] | Freq: Two times a day (BID) | OPHTHALMIC | 3 refills | Status: DC | PRN
Start: 2016-09-07 — End: 2017-03-11

## 2016-09-07 NOTE — Telephone Encounter (Signed)
Patient called back and was informed that azelastine eye drops were sent in to the [facility] on [address].

## 2016-09-07 NOTE — Telephone Encounter (Signed)
Med[facility] has [facility] on [address]. Called patient and left message to call the office.

## 2016-09-09 ENCOUNTER — Ambulatory Visit (INDEPENDENT_AMBULATORY_CARE_PROVIDER_SITE_OTHER): Payer: BLUE CROSS/BLUE SHIELD | Admitting: *Deleted

## 2016-09-09 DIAGNOSIS — J309 Allergic rhinitis, unspecified: Secondary | ICD-10-CM | POA: Diagnosis not present

## 2016-09-14 ENCOUNTER — Ambulatory Visit (INDEPENDENT_AMBULATORY_CARE_PROVIDER_SITE_OTHER): Payer: BLUE CROSS/BLUE SHIELD | Admitting: *Deleted

## 2016-09-14 DIAGNOSIS — J309 Allergic rhinitis, unspecified: Secondary | ICD-10-CM

## 2016-09-16 ENCOUNTER — Ambulatory Visit (INDEPENDENT_AMBULATORY_CARE_PROVIDER_SITE_OTHER): Payer: BLUE CROSS/BLUE SHIELD | Admitting: *Deleted

## 2016-09-16 DIAGNOSIS — J309 Allergic rhinitis, unspecified: Secondary | ICD-10-CM | POA: Diagnosis not present

## 2016-09-21 ENCOUNTER — Ambulatory Visit (INDEPENDENT_AMBULATORY_CARE_PROVIDER_SITE_OTHER): Payer: BLUE CROSS/BLUE SHIELD | Admitting: *Deleted

## 2016-09-21 DIAGNOSIS — J309 Allergic rhinitis, unspecified: Secondary | ICD-10-CM

## 2016-09-23 ENCOUNTER — Ambulatory Visit (INDEPENDENT_AMBULATORY_CARE_PROVIDER_SITE_OTHER): Payer: BLUE CROSS/BLUE SHIELD

## 2016-09-23 DIAGNOSIS — J309 Allergic rhinitis, unspecified: Secondary | ICD-10-CM

## 2016-09-27 NOTE — Progress Notes (Signed)
Office Visit Note  Patient: Dana Hicks             Date of Birth: 1975-06-13           MRN: [id number]             PCP: [name], MD Referring: [name], MD Visit Date: 10/01/2016 Occupation: _0 @    Subjective:  Rheumatoid Arthritis (Not doing good)   History of Present Illness: Dana Hicks is a 41 y.o. female with history of seronegative rheumatoid arthritis. She was started on Orencia subcutaneous in May 2018. She states after taking her first shot of Orencia she developed a upper respiratory tract infection and came off Orencia. She was off Orencia for couple of weeks and then restarted. She's been on Orencia for 2 months now. She states she's having a flare with increased joint pain and swelling. She's been having discomfort in her left shoulder, bilateral elbows, her bilateral thumb, left knee and her feet. She states she had an injection of cortisone in her left foot by podiatrist. She has not noticed any improvement on Orencia so far.  Activities of Daily Living:  Patient reports morning stiffness for 20 minutes.   Patient Denies nocturnal pain.  Difficulty dressing/grooming: Denies Difficulty climbing stairs: Reports Difficulty getting out of chair: Reports Difficulty using hands for taps, buttons, cutlery, and/or writing: Denies   Review of Systems  Constitutional: Positive for fatigue. Negative for night sweats, weight gain, weight loss and weakness.  HENT: Negative for mouth sores, trouble swallowing, trouble swallowing, mouth dryness and nose dryness.   Eyes: Negative for pain, redness, visual disturbance and dryness.       Eye watering  Respiratory: Negative for cough, shortness of breath and difficulty breathing.   Cardiovascular: Negative for chest pain, palpitations, hypertension, irregular heartbeat and swelling in legs/feet.  Gastrointestinal: Negative for blood in stool, constipation and diarrhea.  Endocrine: Negative for increased  urination.  Genitourinary: Negative for vaginal dryness.  Musculoskeletal: Positive for arthralgias, joint pain and morning stiffness. Negative for joint swelling, myalgias, muscle weakness, muscle tenderness and myalgias.  Skin: Negative for color change, rash, hair loss, skin tightness, ulcers and sensitivity to sunlight.  Allergic/Immunologic: Negative for susceptible to infections.  Neurological: Negative for dizziness, memory loss and night sweats.  Hematological: Negative for swollen glands.  Psychiatric/Behavioral: Negative for depressed mood and sleep disturbance. The patient is not nervous/anxious.     PMFS History:  Patient Active Problem List   Diagnosis Date Noted  . High risk medication use 02/06/2016  . Primary osteoarthritis of both knees 02/06/2016  . Family history of rheumatoid arthritis 02/06/2016  . H/O seasonal allergies 02/06/2016  . Contracture of elbow joint 02/03/2016  . Contracture, right elbow 02/03/2016  . Rheumatoid arthritis of multiple sites with negative rheumatoid factor ([place]) 02/03/2016  . History of colon cancer 02/03/2016  . HLA B27 (HLA B27 positive) 02/03/2016    Past Medical History:  Diagnosis Date  . Cancer ([place]) 2001   cecum-chemo  . Rheumatoid arthritis ([place])   . Seasonal allergies     Family History  Problem Relation Age of Onset  . Arthritis/Rheumatoid Mother   . Allergic rhinitis Father   . Angioedema Neg Hx   . Asthma Neg Hx   . Atopy Neg Hx   . Eczema Neg Hx   . Immunodeficiency Neg Hx   . Urticaria Neg Hx    Past Surgical History:  Procedure Laterality Date  . BREAST LUMPECTOMY WITH  RADIOACTIVE SEED LOCALIZATION Left 06/28/2014   Procedure: RADIOACTIVE SEED LOCALIZATION LEFT BREAST LUMPECTOMY;  Surgeon: Excell Seltzer, MD;  Location: [facility];  Service: General;  Laterality: Left;  . COLONOSCOPY  06/19/14  . PORT A CATH REVISION  2001   in and out  . RIGHT COLECTOMY  2001   cecum cancer   Social  History   Social History Narrative  . No narrative on file     Objective: Vital Signs: BP (!) 136/94 (BP Location: Left Arm, Patient Position: Sitting, Cuff Size: Normal)   Pulse 68   Ht _0  (1.575 m)   Wt 232 lb (105.2 kg)   LMP 09/27/2016   BMI 42.43 kg/m    Physical Exam  Constitutional: She is oriented to person, place, and time. She appears well-developed and well-nourished.  HENT:  Head: Normocephalic and atraumatic.  Eyes: Conjunctivae and EOM are normal.  Neck: Normal range of motion.  Cardiovascular: Normal rate, regular rhythm, normal heart sounds and intact distal pulses.   Pulmonary/Chest: Effort normal and breath sounds normal.  Abdominal: Soft. Bowel sounds are normal.  Lymphadenopathy:    She has no cervical adenopathy.  Neurological: She is alert and oriented to person, place, and time.  Skin: Skin is warm and dry. Capillary refill takes less than 2 seconds.  Psychiatric: She has a normal mood and affect. Her behavior is normal.  Nursing note and vitals reviewed.    Musculoskeletal Exam: C-spine and thoracic lumbar spine good range of motion. Shoulder joints elbow joints wrist joint MCPs PIPs DIPs are good range of motion with no synovitis. Hip joints knee joints ankles MTPs PIPs with good range of motion. She has some warmth and tenderness with range of motion of her left knee joint.  CDAI Exam: CDAI Homunculus Exam:   Tenderness:  LLE: tibiofemoral  Joint Counts:  CDAI Tender Joint count: 1 CDAI Swollen Joint count: 0  Global Assessments:  Patient Global Assessment: 5 Provider Global Assessment: 5  CDAI Calculated Score: 11    Investigation: Findings:  08/11/2015 negative TB gold   CBC Latest Ref Rng & Units 07/02/2016 04/21/2016 01/13/2016  WBC 3.8 - 10.8 K/uL 6.8 6.5 6.5  Hemoglobin 11.7 - 15.5 g/dL 13.1 13.0 13.4  Hematocrit 35.0 - 45.0 % 40.9 40.3 42.2  Platelets 140 - 400 K/uL 295 276 294   CMP Latest Ref Rng & Units 07/02/2016  04/21/2016 01/13/2016  Glucose 65 - 99 mg/dL 85 106(H) 81  BUN 7 - 25 mg/dL _1 Creatinine 0.50 - 1.10 mg/dL 0.65 0.72 0.67  Sodium 135 - 146 mmol/L 139 137 138  Potassium 3.5 - 5.3 mmol/L 4.5 3.7 4.3  Chloride 98 - 110 mmol/L 104 102 103  CO2 20 - 31 mmol/L 26 19(L) 28  Calcium 8.6 - 10.2 mg/dL 8.9 9.2 8.9  Total Protein 6.1 - 8.1 g/dL 6.2 6.3 6.2  Total Bilirubin 0.2 - 1.2 mg/dL 0.4 0.5 0.5  Alkaline Phos 33 - 115 U/L 37 33 34  AST 10 - 30 U/L _2 ALT 6 - 29 U/L _3 Imaging: No results found.  Speciality Comments: No specialty comments available.    Procedures:  Large Joint Inj Date/Time: 10/01/2016 9:00 AM Performed by: Bo Merino Authorized by: Bo Merino   Consent Given by:  Patient Site marked: the procedure site was marked   Timeout: prior to procedure the correct patient, procedure, and site was verified  Indications:  Pain and joint swelling Location:  Knee Site:  L knee Prep: patient was prepped and draped in usual sterile fashion   Needle Size:  27 G Needle Length:  1.5 inches Approach:  Medial Ultrasound Guidance: No   Fluoroscopic Guidance: No   Arthrogram: No   Medications:  1.5 mL lidocaine 1 %; 40 mg triamcinolone acetonide 40 MG/ML Aspiration Attempted: Yes   Aspirate amount (mL):  0 Patient tolerance:  Patient tolerated the procedure well with no immediate complications   Allergies: Codeine; Penicillins; and Bactrim [sulfamethoxazole-trimethoprim]   Assessment / Plan:     Visit Diagnoses: Rheumatoid arthritis of multiple sites with negative rheumatoid factor (HCC)Patient has no synovitis on examination. She only had discomfort pain and mild warmth in her left knee joint. She also has underlying osteoarthritis in her left knee. I believe she is doing fairly well on Orencia. I would like to give it more time.  High risk medication use - Orencia Sub Q(07/06/16) and Methotrexate  (QKSKSH)38 mg sq qwk, Folic acid 2 mg po  qd ( Humira, Enbrel- inadequate response) - Plan: CBC with Differential/Platelet, COMPLETE METABOLIC PANEL WITH GFR, Quantiferon tb gold assay (blood) we will check labs today and then every 3 months to monitor for drug toxicity.  Primary osteoarthritis of both knees - Plan: Large Joint Injection/Arthrocentesis. After informed consent was obtained per patient's request left knee joint was injected with cortisone as described above.  Obesity: Weight loss diet and exercise was discussed.   H/O seasonal allergies: She is seeing allergist currently.  History of colon cancer  HLA B27 (HLA B27 positive)    Orders: Orders Placed This Encounter  Procedures  . Large Joint Injection/Arthrocentesis   Meds ordered this encounter  Medications  . Abatacept (ORENCIA CLICKJECT) 871 MG/ML SOAJ    Sig: Inject 125 mg into the skin once a week.    Dispense:  4 mL    Refill:  2    Face-to-face time spent with patient was 30 minutes. Greater than 50% of time was spent in counseling and coordination of care.  Follow-Up Instructions: Return in about 3 months (around 01/01/2017) for Osteoarthritis, Rheumatoid arthritis.   Bo Merino, MD  Note - This record has been created using Editor, [employer].  Chart creation errors have been sought, but may not always  have been located. Such creation errors do not reflect on  the standard of medical care.

## 2016-09-28 ENCOUNTER — Ambulatory Visit (INDEPENDENT_AMBULATORY_CARE_PROVIDER_SITE_OTHER): Payer: BLUE CROSS/BLUE SHIELD

## 2016-09-28 DIAGNOSIS — J309 Allergic rhinitis, unspecified: Secondary | ICD-10-CM

## 2016-09-30 ENCOUNTER — Ambulatory Visit (INDEPENDENT_AMBULATORY_CARE_PROVIDER_SITE_OTHER): Payer: BLUE CROSS/BLUE SHIELD | Admitting: *Deleted

## 2016-09-30 DIAGNOSIS — J309 Allergic rhinitis, unspecified: Secondary | ICD-10-CM

## 2016-10-01 ENCOUNTER — Other Ambulatory Visit: Payer: Self-pay | Admitting: Rheumatology

## 2016-10-01 ENCOUNTER — Ambulatory Visit (INDEPENDENT_AMBULATORY_CARE_PROVIDER_SITE_OTHER): Payer: BLUE CROSS/BLUE SHIELD | Admitting: Rheumatology

## 2016-10-01 ENCOUNTER — Encounter: Payer: Self-pay | Admitting: Rheumatology

## 2016-10-01 VITALS — BP 136/94 | HR 68 | Ht 62.0 in | Wt 232.0 lb

## 2016-10-01 DIAGNOSIS — M1712 Unilateral primary osteoarthritis, left knee: Secondary | ICD-10-CM | POA: Diagnosis not present

## 2016-10-01 DIAGNOSIS — Z6841 Body Mass Index (BMI) 40.0 and over, adult: Secondary | ICD-10-CM

## 2016-10-01 DIAGNOSIS — Z889 Allergy status to unspecified drugs, medicaments and biological substances status: Secondary | ICD-10-CM

## 2016-10-01 DIAGNOSIS — M0609 Rheumatoid arthritis without rheumatoid factor, multiple sites: Secondary | ICD-10-CM

## 2016-10-01 DIAGNOSIS — M170 Bilateral primary osteoarthritis of knee: Secondary | ICD-10-CM

## 2016-10-01 DIAGNOSIS — Z1589 Genetic susceptibility to other disease: Secondary | ICD-10-CM

## 2016-10-01 DIAGNOSIS — Z85038 Personal history of other malignant neoplasm of large intestine: Secondary | ICD-10-CM | POA: Diagnosis not present

## 2016-10-01 DIAGNOSIS — Z79899 Other long term (current) drug therapy: Secondary | ICD-10-CM | POA: Diagnosis not present

## 2016-10-01 DIAGNOSIS — M1711 Unilateral primary osteoarthritis, right knee: Secondary | ICD-10-CM | POA: Diagnosis not present

## 2016-10-01 LAB — CBC WITH DIFFERENTIAL/PLATELET
Basophils Absolute: 72 cells/uL (ref 0–200)
Basophils Relative: 1 %
Eosinophils Absolute: 72 cells/uL (ref 15–500)
Eosinophils Relative: 1 %
HCT: 40.7 % (ref 35.0–45.0)
Hemoglobin: 13.2 g/dL (ref 11.7–15.5)
Lymphocytes Relative: 23 %
Lymphs Abs: 1656 cells/uL (ref 850–3900)
MCH: 30.8 pg (ref 27.0–33.0)
MCHC: 32.4 g/dL (ref 32.0–36.0)
MCV: 94.9 fL (ref 80.0–100.0)
MPV: 11.7 fL (ref 7.5–12.5)
Monocytes Absolute: 648 cells/uL (ref 200–950)
Monocytes Relative: 9 %
Neutro Abs: 4752 cells/uL (ref 1500–7800)
Neutrophils Relative %: 66 %
Platelets: 300 10*3/uL (ref 140–400)
RBC: 4.29 MIL/uL (ref 3.80–5.10)
RDW: 14.2 % (ref 11.0–15.0)
WBC: 7.2 10*3/uL (ref 3.8–10.8)

## 2016-10-01 MED ORDER — LIDOCAINE HCL 1 % IJ SOLN
1.5000 mL | INTRAMUSCULAR | Status: AC | PRN
  Administered 2016-10-01: 1.5 mL

## 2016-10-01 MED ORDER — ABATACEPT 125 MG/ML ~~LOC~~ SOAJ: 125.0000 mg | SUBCUTANEOUS | 2 refills | Status: DC

## 2016-10-01 MED ORDER — TRIAMCINOLONE ACETONIDE 40 MG/ML IJ SUSP
40.0000 mg | INTRAMUSCULAR | Status: AC | PRN
  Administered 2016-10-01: 40 mg via INTRA_ARTICULAR

## 2016-10-01 NOTE — Progress Notes (Signed)
Rheumatology Medication Review by a Pharmacist Does the patient feel that his/her medications are working for him/her?  Yes Has the patient been experiencing any side effects to the medications prescribed?  No Does the patient have any problems obtaining medications?  No  Issues to address at subsequent visits: None   Pharmacist comments:  Dana Hicks is a 41 yo F who presents for follow up of her rheumatoid arthritis.  She is currently taking Orencia 125 mg weekly, Rasuvo 20 mg weekly, and folic acid 2 mg daily.  She was started on the Orencia in May and is past due for standing labs today.  Hicks recent TB Gold negative on 08/07/15.  She is also due for TB Gold today.  Patient denies any questions or concerns regarding her medications at this time.   Dana Hicks, Pharm.D., BCPS, CPP Clinical Pharmacist Pager: [phone] Phone: [phone] 10/01/2016 8:46 AM

## 2016-10-01 NOTE — Telephone Encounter (Signed)
Patient is in office for a visit will take care of during visit.

## 2016-10-01 NOTE — Patient Instructions (Signed)
Standing Labs We placed an order today for your standing lab work.    Please come back and get your standing labs in November 2018 and every 3 months.  We have open lab Monday through Friday from 8:30-11:30 AM and 1:30-4 PM at the office of Dr. Shaili Deveshwar.   The office is located at [address], [address], [place],  [address] No appointment is necessary.   Labs are drawn by [facility].  You may receive a bill from [facility] for your lab work. If you have any questions regarding directions or hours of operation,  please call [phone].     

## 2016-10-01 NOTE — Progress Notes (Deleted)
Office Visit Note  Patient: Dana Hicks             Date of Birth: 10-06-1975           MRN: [id number]             PCP: [name], MD Visit Date: 10/01/2016   Assessment: Visit Diagnoses: Rheumatoid arthritis of multiple sites with negative rheumatoid factor (HCC)  High risk medication use - Orencia Sub Q(07/06/16) and Methotrexate  (VOZDGU)44 mg sq qwk, Folic acid 2 mg po qd ( Humira, Enbrel- inadequate response) - Plan: CBC with Differential/Platelet, COMPLETE METABOLIC PANEL WITH GFR, Quantiferon tb gold assay (blood)  Contracture of left elbow  Contracture, right elbow  Primary osteoarthritis of both knees  H/O seasonal allergies  History of colon cancer  HLA B27 (HLA B27 positive)  High risk medication use - Plan: CBC with Differential/Platelet, COMPLETE METABOLIC PANEL WITH GFR, Quantiferon tb gold assay (blood)  High risk medication use - Methotrexate, folic acid, Enbrel(failed Humira) - Plan: CBC with Differential/Platelet, COMPLETE METABOLIC PANEL WITH GFR, Quantiferon tb gold assay (blood)   Follow-Up Instructions: No Follow-up on file.  Orders: No orders of the defined types were placed in this encounter.  No orders of the defined types were placed in this encounter.    Subjective:    Allergies: Codeine; Penicillins; and Bactrim [sulfamethoxazole-trimethoprim]   Activities of Daily Living: ***   History of Present Illness: Dana Hicks is a 41 y.o. female ***   Review of Systems  Constitutional: Positive for activity change, fatigue, fever and weakness.  HENT: Positive for ear pain.   Eyes: Positive for discharge.  Musculoskeletal: Positive for arthralgias, joint pain, joint swelling, muscle weakness, morning stiffness and muscle tenderness.  Hematological: Positive for bruising/bleeding tendency.     Investigation: No additional findings.   Objective: Vital Signs: BP (!) 136/94 (BP Location: Left Arm, Patient Position: Sitting, Cuff  Size: Normal)   Pulse 68   Ht '5\' 2"'  (1.575 m)   Wt 232 lb (105.2 kg)   LMP 09/27/2016   BMI 42.43 kg/m    Physical Exam   Musculoskeletal Exam: ***  CDAI Exam: No CDAI exam completed.  CDAI comments:  Speciality Comments: No specialty comments available.  Imaging: No results found.   PMFS History:  Patient Active Problem List   Diagnosis Date Noted  . High risk medication use 02/06/2016  . Primary osteoarthritis of both knees 02/06/2016  . Family history of rheumatoid arthritis 02/06/2016  . H/O seasonal allergies 02/06/2016  . Contracture of elbow joint 02/03/2016  . Contracture, right elbow 02/03/2016  . Rheumatoid arthritis of multiple sites with negative rheumatoid factor ([place]) 02/03/2016  . History of colon cancer 02/03/2016  . HLA B27 (HLA B27 positive) 02/03/2016    Past Medical History:  Diagnosis Date  . Cancer ([place]) 2001   cecum-chemo  . Rheumatoid arthritis ([place])   . Seasonal allergies     Family History  Problem Relation Age of Onset  . Arthritis/Rheumatoid Mother   . Allergic rhinitis Father   . Angioedema Neg Hx   . Asthma Neg Hx   . Atopy Neg Hx   . Eczema Neg Hx   . Immunodeficiency Neg Hx   . Urticaria Neg Hx    Past Surgical History:  Procedure Laterality Date  . BREAST LUMPECTOMY WITH RADIOACTIVE SEED LOCALIZATION Left 06/28/2014   Procedure: RADIOACTIVE SEED LOCALIZATION LEFT BREAST LUMPECTOMY;  Surgeon: Excell Seltzer, MD;  Location: [facility];  Service: General;  Laterality: Left;  . COLONOSCOPY  06/19/14  . PORT A CATH REVISION  2001   in and out  . RIGHT COLECTOMY  2001   cecum cancer   Social History   Social History Narrative  . No narrative on file     Procedures:  No procedures performed  Bo Merino, MD  Note - This record has been created using [employer]. Chart creation errors have been sought, but may not always have been located. Such creation errors do not reflect on the standard of  medical care.

## 2016-10-02 LAB — COMPLETE METABOLIC PANEL WITH GFR
ALT: 21 U/L (ref 6–29)
AST: 16 U/L (ref 10–30)
Albumin: 4.2 g/dL (ref 3.6–5.1)
Alkaline Phosphatase: 45 U/L (ref 33–115)
BUN: 12 mg/dL (ref 7–25)
CO2: 22 mmol/L (ref 20–32)
Calcium: 9.1 mg/dL (ref 8.6–10.2)
Chloride: 102 mmol/L (ref 98–110)
Creat: 0.7 mg/dL (ref 0.50–1.10)
GFR, Est African American: >89 mL/min (ref >=60)
GFR, Est Non African American: >89 mL/min (ref >=60)
Glucose, Bld: 88 mg/dL (ref 65–99)
Potassium: 4.2 mmol/L (ref 3.5–5.3)
Sodium: 138 mmol/L (ref 135–146)
Total Bilirubin: 0.5 mg/dL (ref 0.2–1.2)
Total Protein: 6.4 g/dL (ref 6.1–8.1)

## 2016-10-05 ENCOUNTER — Ambulatory Visit (INDEPENDENT_AMBULATORY_CARE_PROVIDER_SITE_OTHER): Payer: BLUE CROSS/BLUE SHIELD | Admitting: *Deleted

## 2016-10-05 ENCOUNTER — Telehealth: Payer: Self-pay | Admitting: Radiology

## 2016-10-05 DIAGNOSIS — J309 Allergic rhinitis, unspecified: Secondary | ICD-10-CM

## 2016-10-05 LAB — QUANTIFERON TB GOLD ASSAY (BLOOD)
Interferon Gamma Release Assay: NEGATIVE
Mitogen-Nil: >10 IU/mL
Quantiferon Nil Value: 0.03 IU/mL
Quantiferon Tb Ag Minus Nil Value: 0 IU/mL

## 2016-10-05 NOTE — Progress Notes (Signed)
WNL

## 2016-10-05 NOTE — Telephone Encounter (Signed)
I have called patient to advise labs are normal  

## 2016-10-05 NOTE — Telephone Encounter (Signed)
-----   Message from Bo Merino, MD sent at 10/05/2016  1:19 PM EDT ----- WNL

## 2016-10-07 ENCOUNTER — Ambulatory Visit (INDEPENDENT_AMBULATORY_CARE_PROVIDER_SITE_OTHER): Payer: BLUE CROSS/BLUE SHIELD | Admitting: *Deleted

## 2016-10-07 DIAGNOSIS — J309 Allergic rhinitis, unspecified: Secondary | ICD-10-CM

## 2016-10-12 ENCOUNTER — Ambulatory Visit (INDEPENDENT_AMBULATORY_CARE_PROVIDER_SITE_OTHER): Payer: BLUE CROSS/BLUE SHIELD | Admitting: *Deleted

## 2016-10-12 DIAGNOSIS — J309 Allergic rhinitis, unspecified: Secondary | ICD-10-CM | POA: Diagnosis not present

## 2016-10-13 DIAGNOSIS — H1013 Acute atopic conjunctivitis, bilateral: Secondary | ICD-10-CM | POA: Diagnosis not present

## 2016-10-14 ENCOUNTER — Ambulatory Visit (INDEPENDENT_AMBULATORY_CARE_PROVIDER_SITE_OTHER): Payer: BLUE CROSS/BLUE SHIELD | Admitting: *Deleted

## 2016-10-14 DIAGNOSIS — J309 Allergic rhinitis, unspecified: Secondary | ICD-10-CM

## 2016-10-19 ENCOUNTER — Ambulatory Visit (INDEPENDENT_AMBULATORY_CARE_PROVIDER_SITE_OTHER): Payer: BLUE CROSS/BLUE SHIELD | Admitting: *Deleted

## 2016-10-19 DIAGNOSIS — J309 Allergic rhinitis, unspecified: Secondary | ICD-10-CM

## 2016-10-21 ENCOUNTER — Ambulatory Visit (INDEPENDENT_AMBULATORY_CARE_PROVIDER_SITE_OTHER): Payer: BLUE CROSS/BLUE SHIELD | Admitting: *Deleted

## 2016-10-21 ENCOUNTER — Telehealth: Payer: Self-pay | Admitting: Rheumatology

## 2016-10-21 DIAGNOSIS — J309 Allergic rhinitis, unspecified: Secondary | ICD-10-CM | POA: Diagnosis not present

## 2016-10-21 NOTE — Telephone Encounter (Signed)
Patient states she has no energy. She states she is having pain in her knee which has not gotten better. Patient advised we will apply for the [place] supplementation.

## 2016-10-21 NOTE — Telephone Encounter (Signed)
Patient was seen a couple of weeks ago, and received cortisone injection in knee. Patient has had no relief, an would like to know when she had gel injections last. Would like to know if that is a possibility. Please call patient to advise.

## 2016-10-21 NOTE — Telephone Encounter (Signed)
Ok to apply

## 2016-10-21 NOTE — Telephone Encounter (Signed)
Patient last had Orthovisc to Bilateral Knees in May 2016. She would like to know if this is an option for her again?

## 2016-10-26 ENCOUNTER — Ambulatory Visit (INDEPENDENT_AMBULATORY_CARE_PROVIDER_SITE_OTHER): Payer: BLUE CROSS/BLUE SHIELD

## 2016-10-26 DIAGNOSIS — J309 Allergic rhinitis, unspecified: Secondary | ICD-10-CM

## 2016-10-28 ENCOUNTER — Ambulatory Visit (INDEPENDENT_AMBULATORY_CARE_PROVIDER_SITE_OTHER): Payer: BLUE CROSS/BLUE SHIELD | Admitting: *Deleted

## 2016-10-28 DIAGNOSIS — J309 Allergic rhinitis, unspecified: Secondary | ICD-10-CM

## 2016-11-02 ENCOUNTER — Ambulatory Visit (INDEPENDENT_AMBULATORY_CARE_PROVIDER_SITE_OTHER): Payer: BLUE CROSS/BLUE SHIELD | Admitting: *Deleted

## 2016-11-02 DIAGNOSIS — J309 Allergic rhinitis, unspecified: Secondary | ICD-10-CM

## 2016-11-03 ENCOUNTER — Telehealth: Payer: Self-pay

## 2016-11-03 NOTE — Telephone Encounter (Signed)
Patient called and stated that she got her injection on yesterday. She stated that her Rt. Arm was itching when she left but by the time she got home it was red. Today she stated that from her elbow to under her are it is red and hot to touch. She is taking her antihistamine and she is using hydrocortisone cream on the area. I informed her that she is doing exactly what she is supposed to do. I told that if it got worse for her to call the office to be seen. She understood.

## 2016-11-09 ENCOUNTER — Ambulatory Visit (INDEPENDENT_AMBULATORY_CARE_PROVIDER_SITE_OTHER): Payer: BLUE CROSS/BLUE SHIELD | Admitting: *Deleted

## 2016-11-09 ENCOUNTER — Telehealth: Payer: Self-pay | Admitting: Rheumatology

## 2016-11-09 DIAGNOSIS — J309 Allergic rhinitis, unspecified: Secondary | ICD-10-CM | POA: Diagnosis not present

## 2016-11-09 NOTE — Telephone Encounter (Signed)
Patient calling requesting the status of gel injections. Please call to inform.

## 2016-11-10 ENCOUNTER — Telehealth: Payer: Self-pay

## 2016-11-10 NOTE — Telephone Encounter (Signed)
Patient called to inform us that she received her injection on yesterday. She stated that her left arm is warm,red and swollen. She stated that this happen on her last injection as well. She stated that she will keep [place] informed if anything changes.

## 2016-11-10 NOTE — Telephone Encounter (Signed)
Is she already on Schedule A? If not, please change her to Schedule A. Is it the pollen vial that causes these reactions?   Thanks, Salvatore Marvel, MD [facility] [place] [facility] [place]

## 2016-11-12 ENCOUNTER — Telehealth: Payer: Self-pay | Admitting: Rheumatology

## 2016-11-12 NOTE — Telephone Encounter (Signed)
Patient request a call back in reference to medication. Patient did not want to elaborate.

## 2016-11-12 NOTE — Telephone Encounter (Signed)
I called patient, she states she is having a terrible time with her knee and wants to check on the injections for her knee, has been a couple weeks. I explained this process takes sometimes several months.   She states she called her insurance company, and was told the injections are not covered at all. She wants to know status.

## 2016-11-13 DIAGNOSIS — K089 Disorder of teeth and supporting structures, unspecified: Secondary | ICD-10-CM | POA: Diagnosis not present

## 2016-11-18 ENCOUNTER — Ambulatory Visit (INDEPENDENT_AMBULATORY_CARE_PROVIDER_SITE_OTHER): Payer: BLUE CROSS/BLUE SHIELD | Admitting: *Deleted

## 2016-11-18 DIAGNOSIS — J309 Allergic rhinitis, unspecified: Secondary | ICD-10-CM

## 2016-11-19 NOTE — Telephone Encounter (Signed)
Yes it is her pollen vial and I will switch her to Schedule A.

## 2016-11-30 ENCOUNTER — Telehealth: Payer: Self-pay | Admitting: Rheumatology

## 2016-11-30 NOTE — Telephone Encounter (Signed)
Called pt to answer her query she wants to know how long before meds help / she has been on this for 12 weeks and has not had an improvement with her joints, states the pain is the worst is have ever been  Can you look to see if we can get her in this week to discuss treatment options??

## 2016-11-30 NOTE — Telephone Encounter (Signed)
Patient calling with questions about Orencia. Please call to advise.

## 2016-11-30 NOTE — Progress Notes (Signed)
Office Visit Note  Patient: Dana Hicks             Date of Birth: 1975-06-22           MRN: [id number]             PCP: [name], MD Referring: [name], MD Visit Date: 12/01/2016 Occupation: _0 @    Subjective:  Pain of the Left Knee and Joint Pain   History of Present Illness: Dana Hicks is a 41 y.o. female with history of seronegative rheumatoid arthritis and osteoarthritis overlap. She states she's been having increased pain and discomfort in her bilateral hands, bilateral feet in bilateral knee joints. She states she's not sure if Dana Hicks is working. She is on concrete floors all day and has to lift heavy objects all day. She continues to have discomfort in her left knee joint despite having cortisone injection. She describes pain in her bilateral CMC joints.   Activities of Daily Living:  Patient reports morning stiffness for all day .   Patient Reports nocturnal pain.  Difficulty dressing/grooming: Reports Difficulty climbing stairs: Reports Difficulty getting out of chair: Reports Difficulty using hands for taps, buttons, cutlery, and/or writing: Denies   Review of Systems  Constitutional: Positive for fatigue. Negative for night sweats, weight gain, weight loss and weakness.  HENT: Negative.  Negative for mouth sores, trouble swallowing, trouble swallowing, mouth dryness and nose dryness.   Eyes: Positive for itching and dryness. Negative for pain, redness and visual disturbance.       Has surgery scheduled for tear ducts   Respiratory: Negative.  Negative for cough, shortness of breath and difficulty breathing.   Cardiovascular: Negative.  Negative for chest pain, palpitations, hypertension, irregular heartbeat and swelling in legs/feet.  Gastrointestinal: Negative.  Negative for blood in stool, constipation and diarrhea.  Endocrine: Negative.  Negative for increased urination.  Genitourinary: Negative.  Negative for vaginal dryness.    Musculoskeletal: Positive for arthralgias, gait problem, joint pain, myalgias, morning stiffness and myalgias. Negative for joint swelling, muscle weakness and muscle tenderness.       Left knee, both CMC joints, right elbow and bilateral feet pain all over   Skin: Negative.  Negative for color change, rash, hair loss, skin tightness, ulcers and sensitivity to sunlight.  Allergic/Immunologic: Negative.  Negative for susceptible to infections.  Neurological: Negative for dizziness, memory loss and night sweats.       Negative   Hematological: Negative.  Negative for swollen glands.  Psychiatric/Behavioral: Negative.  Negative for depressed mood and sleep disturbance. The patient is not nervous/anxious.     PMFS History:  Patient Active Problem List   Diagnosis Date Noted  . Osteoarthritis of both knees 12/01/2016  . Class 3 severe obesity due to excess calories without serious comorbidity with body mass index (BMI) of 40.0 to 44.9 in adult ([place]) 10/01/2016  . High risk medication use 02/06/2016  . Family history of rheumatoid arthritis 02/06/2016  . H/O seasonal allergies 02/06/2016  . Rheumatoid arthritis of multiple sites with negative rheumatoid factor ([place]) 02/03/2016  . History of colon cancer 02/03/2016  . HLA B27 (HLA B27 positive) 02/03/2016    Past Medical History:  Diagnosis Date  . Cancer ([place]) 2001   cecum-chemo  . Rheumatoid arthritis ([place])   . Seasonal allergies     Family History  Problem Relation Age of Onset  . Arthritis/Rheumatoid Mother   . Allergic rhinitis Father   . Angioedema Neg Hx   .  Asthma Neg Hx   . Atopy Neg Hx   . Eczema Neg Hx   . Immunodeficiency Neg Hx   . Urticaria Neg Hx    Past Surgical History:  Procedure Laterality Date  . BREAST LUMPECTOMY WITH RADIOACTIVE SEED LOCALIZATION Left 06/28/2014   Procedure: RADIOACTIVE SEED LOCALIZATION LEFT BREAST LUMPECTOMY;  Surgeon: Excell Seltzer, MD;  Location: [facility];  Service:  General;  Laterality: Left;  . COLONOSCOPY  06/19/14  . PORT A CATH REVISION  2001   in and out  . RIGHT COLECTOMY  2001   cecum cancer   Social History   Social History Narrative  . No narrative on file     Objective: Vital Signs: BP 130/78   Pulse 72   Resp 16   Ht _0  (1.575 m)   Wt 224 lb (101.6 kg)   LMP 11/06/2016   BMI 40.97 kg/m    Physical Exam  Constitutional: She is oriented to person, place, and time. She appears well-developed and well-nourished.  HENT:  Head: Normocephalic and atraumatic.  Eyes: Conjunctivae and EOM are normal.  Neck: Normal range of motion.  Cardiovascular: Normal rate, regular rhythm, normal heart sounds and intact distal pulses.   Pulmonary/Chest: Effort normal and breath sounds normal.  Abdominal: Soft. Bowel sounds are normal.  Lymphadenopathy:    She has no cervical adenopathy.  Neurological: She is alert and oriented to person, place, and time.  Skin: Skin is warm and dry. Capillary refill takes less than 2 seconds.  Psychiatric: She has a normal mood and affect. Her behavior is normal.  Nursing note and vitals reviewed.    Musculoskeletal Exam: C-spine and thoracic lumbar spine good range of motion. Shoulder joints elbow joints wrist joint MCPs PIPs DIPs with good range of motion with no synovitis. She had tenderness on palpation of bilateral CMC joints. Hip joints knee joints ankles MTPs PIPs with good range of motion. She is some warmth and swelling in her left knee joint.  CDAI Exam: CDAI Homunculus Exam:   Tenderness:  LLE: tibiofemoral  Swelling:  LLE: tibiofemoral  Joint Counts:  CDAI Tender Joint count: 1 CDAI Swollen Joint count: 1  Global Assessments:  Patient Global Assessment: 7 Provider Global Assessment: 5  CDAI Calculated Score: 14    Investigation: Findings:  10/05/16 negative TB gold    CBC Latest Ref Rng & Units 10/01/2016 07/02/2016 04/21/2016  WBC 3.8 - 10.8 K/uL 7.2 6.8 6.5  Hemoglobin 11.7  - 15.5 g/dL 13.2 13.1 13.0  Hematocrit 35.0 - 45.0 % 40.7 40.9 40.3  Platelets 140 - 400 K/uL 300 295 276   CMP Latest Ref Rng & Units 10/01/2016 07/02/2016 04/21/2016  Glucose 65 - 99 mg/dL 88 85 106(H)  BUN 7 - 25 mg/dL _1 Creatinine 0.50 - 1.10 mg/dL 0.70 0.65 0.72  Sodium 135 - 146 mmol/L 138 139 137  Potassium 3.5 - 5.3 mmol/L 4.2 4.5 3.7  Chloride 98 - 110 mmol/L 102 104 102  CO2 20 - 32 mmol/L 22 26 19(L)  Calcium 8.6 - 10.2 mg/dL 9.1 8.9 9.2  Total Protein 6.1 - 8.1 g/dL 6.4 6.2 6.3  Total Bilirubin 0.2 - 1.2 mg/dL 0.5 0.4 0.5  Alkaline Phos 33 - 115 U/L 45 37 33  AST 10 - 30 U/L _2 ALT 6 - 29 U/L _3 Imaging: Xr Knee 3 View Right  Result Date: 12/01/2016 Mild medial compartment narrowing and mild patellofemoral  narrowing. A chondrocalcinosis. Impression: These findings are consistent with osteoarthritis and mild chondromalacia patella   Speciality Comments: No specialty comments available.    Procedures:  No procedures performed Allergies: Codeine; Penicillins; and Bactrim [sulfamethoxazole-trimethoprim]   Assessment / Plan:     Visit Diagnoses: Rheumatoid arthritis of multiple sites with negative rheumatoid factor ([place]): She has no synovitis on examination on her hands.She stopped the Orencia about 1  week ago due to upper respiratory tract infection. She will resume it after her eye surgery. I believe at this point her symptoms are coming from underlying osteoarthritis than rheumatoid arthritis flare. She will continue current regimen for right now. We will reassess her situation in 3 months.  High risk medication use - Orencia sq  qweek(07/06/16) and Rasuvo 20 mg sq  wk, Folic acid 2 mg po qd( Enbrel, Humira - inadequate response) - Plan: CBC with Differential/Platelet, COMPLETE METABOLIC PANEL WITH GFR today and then every 3 months.  Pain bilateral hands: Most of her discomfort in her bilateral CMC joints. I have given her prescription for bilateral  CMC braces.  Chronic pain of both knees -she continues to have some warmth and swelling in her left knee joint. Plan: XR KNEE 3 VIEW RIGHT, XR KNEE 3 VIEW LEFT. Her left knee joint showed moderate osteoarthritis and chondromalacia patella and right knee joint showed mild osteoarthritis and chondromalacia patella.  Primary osteoarthritis of both knees: Weight loss diet and exercise was discussed.  Pain in both feet: Proper fitting shoes were discussed.  H/O seasonal allergies  History of colon cancer  HLA B27 (HLA B27 positive)  Orders: Orders Placed This Encounter  Procedures  . XR KNEE 3 VIEW RIGHT  . XR KNEE 3 VIEW LEFT   No orders of the defined types were placed in this encounter.   Face-to-face time spent with patient was 35 minutes.Greater than 50% of time was spent in counseling and coordination of care.  Follow-Up Instructions: Return in about 3 months (around 03/03/2017) for Rheumatoid arthritis, Osteoarthritis.   Bo Merino, MD  Note - This record has been created using Editor, commissioning.  Chart creation errors have been sought, but may not always  have been located. Such creation errors do not reflect on  the standard of medical care.

## 2016-12-01 ENCOUNTER — Ambulatory Visit (INDEPENDENT_AMBULATORY_CARE_PROVIDER_SITE_OTHER): Payer: Self-pay

## 2016-12-01 ENCOUNTER — Encounter: Payer: Self-pay | Admitting: Rheumatology

## 2016-12-01 ENCOUNTER — Ambulatory Visit (INDEPENDENT_AMBULATORY_CARE_PROVIDER_SITE_OTHER): Payer: BLUE CROSS/BLUE SHIELD | Admitting: Rheumatology

## 2016-12-01 ENCOUNTER — Ambulatory Visit (INDEPENDENT_AMBULATORY_CARE_PROVIDER_SITE_OTHER): Payer: BLUE CROSS/BLUE SHIELD

## 2016-12-01 VITALS — BP 130/78 | HR 72 | Resp 16 | Ht 62.0 in | Wt 224.0 lb

## 2016-12-01 DIAGNOSIS — Z889 Allergy status to unspecified drugs, medicaments and biological substances status: Secondary | ICD-10-CM | POA: Diagnosis not present

## 2016-12-01 DIAGNOSIS — M25561 Pain in right knee: Secondary | ICD-10-CM | POA: Diagnosis not present

## 2016-12-01 DIAGNOSIS — Z85038 Personal history of other malignant neoplasm of large intestine: Secondary | ICD-10-CM | POA: Diagnosis not present

## 2016-12-01 DIAGNOSIS — M0609 Rheumatoid arthritis without rheumatoid factor, multiple sites: Secondary | ICD-10-CM | POA: Diagnosis not present

## 2016-12-01 DIAGNOSIS — Z1589 Genetic susceptibility to other disease: Secondary | ICD-10-CM | POA: Diagnosis not present

## 2016-12-01 DIAGNOSIS — M170 Bilateral primary osteoarthritis of knee: Secondary | ICD-10-CM | POA: Diagnosis not present

## 2016-12-01 DIAGNOSIS — M25562 Pain in left knee: Secondary | ICD-10-CM | POA: Diagnosis not present

## 2016-12-01 DIAGNOSIS — M79672 Pain in left foot: Secondary | ICD-10-CM

## 2016-12-01 DIAGNOSIS — M79671 Pain in right foot: Secondary | ICD-10-CM

## 2016-12-01 DIAGNOSIS — Z79899 Other long term (current) drug therapy: Secondary | ICD-10-CM | POA: Diagnosis not present

## 2016-12-01 DIAGNOSIS — G8929 Other chronic pain: Secondary | ICD-10-CM

## 2016-12-01 LAB — COMPLETE METABOLIC PANEL WITHOUT GFR
AG Ratio: 1.8 (calc) (ref 1.0–2.5)
ALT: 18 U/L (ref 6–29)
AST: 13 U/L (ref 10–30)
Albumin: 3.9 g/dL (ref 3.6–5.1)
Alkaline phosphatase (APISO): 46 U/L (ref 33–115)
BUN: 12 mg/dL (ref 7–25)
CO2: 26 mmol/L (ref 20–32)
Calcium: 8.8 mg/dL (ref 8.6–10.2)
Chloride: 104 mmol/L (ref 98–110)
Creat: 0.68 mg/dL (ref 0.50–1.10)
GFR, Est African American: 124 mL/min/1.73m2
GFR, Est Non African American: 107 mL/min/1.73m2
Globulin: 2.2 g/dL (ref 1.9–3.7)
Glucose, Bld: 86 mg/dL (ref 65–99)
Potassium: 4 mmol/L (ref 3.5–5.3)
Sodium: 139 mmol/L (ref 135–146)
Total Bilirubin: 0.3 mg/dL (ref 0.2–1.2)
Total Protein: 6.1 g/dL (ref 6.1–8.1)

## 2016-12-01 LAB — CBC WITH DIFFERENTIAL/PLATELET
Basophils Absolute: 43 {cells}/uL (ref 0–200)
Basophils Relative: 0.6 %
Eosinophils Absolute: 78 {cells}/uL (ref 15–500)
Eosinophils Relative: 1.1 %
HCT: 36.1 % (ref 35.0–45.0)
Hemoglobin: 12.2 g/dL (ref 11.7–15.5)
Lymphs Abs: 2329 {cells}/uL (ref 850–3900)
MCH: 31.2 pg (ref 27.0–33.0)
MCHC: 33.8 g/dL (ref 32.0–36.0)
MCV: 92.3 fL (ref 80.0–100.0)
MPV: 12 fL (ref 7.5–12.5)
Monocytes Relative: 5.5 %
Neutro Abs: 4260 {cells}/uL (ref 1500–7800)
Neutrophils Relative %: 60 %
Platelets: 263 Thousand/uL (ref 140–400)
RBC: 3.91 Million/uL (ref 3.80–5.10)
RDW: 13 % (ref 11.0–15.0)
Total Lymphocyte: 32.8 %
WBC mixed population: 391 {cells}/uL (ref 200–950)
WBC: 7.1 Thousand/uL (ref 3.8–10.8)

## 2016-12-01 NOTE — Patient Instructions (Signed)
Standing Labs We placed an order today for your standing lab work.    Please come back and get your standing labs in January and every 3 months  We have open lab Monday through Friday from 8:30-11:30 AM and 1:30-4 PM at the office of Dr. Matasha Smigelski.   The office is located at [address], [address], [place], [place] [address] No appointment is necessary.   Labs are drawn by [facility].  You may receive a bill from [facility] for your lab work. If you have any questions regarding directions or hours of operation,  please call [phone].    

## 2016-12-02 ENCOUNTER — Telehealth: Payer: Self-pay | Admitting: Radiology

## 2016-12-02 NOTE — Telephone Encounter (Signed)
-----   Message from Bo Merino, MD sent at 12/02/2016  9:05 AM EDT ----- WNLs

## 2016-12-02 NOTE — Progress Notes (Signed)
WNLs

## 2016-12-02 NOTE — Telephone Encounter (Signed)
I have called patient to advise labs are normal  

## 2016-12-07 ENCOUNTER — Ambulatory Visit (INDEPENDENT_AMBULATORY_CARE_PROVIDER_SITE_OTHER): Payer: BLUE CROSS/BLUE SHIELD | Admitting: *Deleted

## 2016-12-07 DIAGNOSIS — J309 Allergic rhinitis, unspecified: Secondary | ICD-10-CM | POA: Diagnosis not present

## 2016-12-13 ENCOUNTER — Telehealth: Payer: Self-pay | Admitting: Rheumatology

## 2016-12-13 NOTE — Telephone Encounter (Signed)
Patient called stating that she is having some swelling in her left leg, she does not know if she needs Dr. Estanislado Pandy or Dr. Durward Fortes, she would like for you to call her.  CB#[phone].  Thank you.

## 2016-12-14 NOTE — Telephone Encounter (Signed)
Patient states she is having pain in the back on her calf and swelling in her knee. Patient states she has iced her knee and wore her brace for her plantar fascitis and feels better today. Patient states she has been having shortness of breath and some dizziness as well. Patient advised to be evaluated at the emergency room.

## 2016-12-17 ENCOUNTER — Telehealth: Payer: Self-pay | Admitting: Rheumatology

## 2016-12-17 NOTE — Telephone Encounter (Signed)
Patient wanted to check with you on the status of her gel injections. Please call to advise.

## 2016-12-21 ENCOUNTER — Ambulatory Visit (INDEPENDENT_AMBULATORY_CARE_PROVIDER_SITE_OTHER): Payer: BLUE CROSS/BLUE SHIELD | Admitting: *Deleted

## 2016-12-21 DIAGNOSIS — J309 Allergic rhinitis, unspecified: Secondary | ICD-10-CM

## 2016-12-24 DIAGNOSIS — H04561 Stenosis of right lacrimal punctum: Secondary | ICD-10-CM | POA: Diagnosis not present

## 2016-12-24 DIAGNOSIS — H04553 Acquired stenosis of bilateral nasolacrimal duct: Secondary | ICD-10-CM | POA: Diagnosis not present

## 2016-12-24 DIAGNOSIS — H04562 Stenosis of left lacrimal punctum: Secondary | ICD-10-CM | POA: Diagnosis not present

## 2016-12-28 ENCOUNTER — Ambulatory Visit (INDEPENDENT_AMBULATORY_CARE_PROVIDER_SITE_OTHER): Payer: BLUE CROSS/BLUE SHIELD | Admitting: *Deleted

## 2016-12-28 DIAGNOSIS — J309 Allergic rhinitis, unspecified: Secondary | ICD-10-CM

## 2016-12-30 ENCOUNTER — Telehealth: Payer: Self-pay | Admitting: Allergy & Immunology

## 2016-12-30 NOTE — Telephone Encounter (Signed)
Patient has sent an email concerned about the times when she is having a reaction following an injection. She feels it is more technique related then actual medication related.  I told her I would research the timing of the reactions.  She received an injection on 12-28-16 and has had another reaction.  I did tell her if she felt better about a certain person giving her an injection that I was ok with her asking for that person as we want her to be comfortable with the care she is receiving.  The three pictures are dated 11-03-16, 11-10-16 and 12-29-16 and they follow an injection on the previous day.  Marland Kitchen

## 2016-12-31 ENCOUNTER — Telehealth: Payer: Self-pay

## 2016-12-31 MED ORDER — PREDNISONE 5 MG PO TABS: ORAL_TABLET | ORAL | 0 refills | Status: DC

## 2016-12-31 NOTE — Telephone Encounter (Signed)
Patient called stating her rasuvo co-pay card has expired and she needs a new one for her next refill. Gave her the information for a new card to give to the pharmacy to process.   BIN: [id number] PCN: [id number] ID: [id number] GP: [place]  Phone: [phone]  She also states that the is experiencing severe plantar facitis She would like an Rx for prednisone for a few days until she can make an appointment to see Dr. Durward Fortes. She plans to schedule an appointment. Her ankle is very swollen. Please advise. Thanks!   Damaris Abeln, [place], CPhT 11:39 AM

## 2016-12-31 NOTE — Telephone Encounter (Signed)
Patient advised prednisone prescription has been sent to the pharmacy.

## 2016-12-31 NOTE — Telephone Encounter (Signed)
Pred 5 mg 4x4d, 3x4d, 2x4d, 1x4d

## 2017-01-04 ENCOUNTER — Ambulatory Visit: Payer: BLUE CROSS/BLUE SHIELD | Admitting: Rheumatology

## 2017-01-04 ENCOUNTER — Ambulatory Visit (INDEPENDENT_AMBULATORY_CARE_PROVIDER_SITE_OTHER): Payer: BLUE CROSS/BLUE SHIELD | Admitting: *Deleted

## 2017-01-04 DIAGNOSIS — J309 Allergic rhinitis, unspecified: Secondary | ICD-10-CM

## 2017-01-06 ENCOUNTER — Other Ambulatory Visit: Payer: Self-pay | Admitting: Rheumatology

## 2017-01-07 NOTE — Telephone Encounter (Signed)
Last visit: 12/01/2016 Next visit: 03/03/2017 Labs: 12/01/2016 WNL TB Gold: 10/01/2016 Negative   Okay to refill per Dr. Estanislado Pandy.

## 2017-01-10 ENCOUNTER — Encounter (INDEPENDENT_AMBULATORY_CARE_PROVIDER_SITE_OTHER): Payer: Self-pay | Admitting: Orthopaedic Surgery

## 2017-01-10 ENCOUNTER — Ambulatory Visit (INDEPENDENT_AMBULATORY_CARE_PROVIDER_SITE_OTHER): Payer: BLUE CROSS/BLUE SHIELD | Admitting: Orthopaedic Surgery

## 2017-01-10 VITALS — BP 154/90 | HR 69 | Resp 16 | Ht 62.0 in | Wt 224.0 lb

## 2017-01-10 DIAGNOSIS — M79672 Pain in left foot: Secondary | ICD-10-CM

## 2017-01-10 NOTE — Progress Notes (Signed)
Office Visit Note   Patient: Dana Hicks           Date of Birth: [birth year]           MRN: [id number] Visit Date: 01/10/2017              Requested by: [name], MD [facility], [place] [address] PCP: [name], MD   Assessment & Plan: Visit Diagnoses:  1. Left foot pain     Plan: Mild left plantar fasciitis and Achilles tendinitis. Presently much better with oral prednisone per Dr. Estanislado Pandy. Continue with comfortable shoes. We talked about icing and stretching for both the Achilles tendinitis in the plantar fasciitis. Would like to reevaluate if she has recurrent symptoms after the prednisone  Is discontinued  Follow-Up Instructions: Return if symptoms worsen or fail to improve.   Orders:  No orders of the defined types were placed in this encounter.  No orders of the defined types were placed in this encounter.     Procedures: No procedures performed   Clinical Data: No additional findings.   Subjective: Chief Complaint  Patient presents with  . Left Foot - Pain, Edema    Dana Hicks is a 41 y o here for possible bilateral plantar fasciitis. Left > right x 2 months.Also sees Dr. Estanislado Pandy for RA , knee pain and leg swelling.She had to come off all meds due to tear duct blockage chemotherapy or inflamation)calf pain left only   Dana Hicks is a history of rheumatoid arthritis presently treated by Dr. Estanislado Pandy. She's been recently changed to [place] and feeling "a little better". She also had seen Dr. Estanislado Pandy for the problem she is having with her left ankle and was placed on prednisone. That is actually "feeling better" as well. Pain seems to be localized along the plantar aspect of her left heel and along the Achilles tendon. She is on her feet long periods of time during the day but no injury or trauma HPI  Review of Systems  Constitutional: Negative for chills, fatigue and fever.  Eyes: Negative for itching.  Respiratory:  Negative for chest tightness and shortness of breath.   Cardiovascular: Positive for leg swelling. Negative for chest pain and palpitations.  Gastrointestinal: Negative for blood in stool, constipation and diarrhea.  Endocrine: Negative for polyuria.  Genitourinary: Negative for dysuria.  Musculoskeletal: Positive for joint swelling. Negative for back pain, neck pain and neck stiffness.  Allergic/Immunologic: Positive for immunocompromised state.  Neurological: Negative for dizziness and numbness.  Hematological: Does not bruise/bleed easily.  Psychiatric/Behavioral: The patient is not nervous/anxious.      Objective: Vital Signs: BP (!) 154/90   Pulse 69   Resp 16   Ht _0  (1.575 m)   Wt 224 lb (101.6 kg)   BMI 40.97 kg/m   Physical Exam  Ortho Exam awake alert and oriented 3. Comfortable sitting. Very minimal discomfort on the plantar aspect of her left heel consistent with very mild plantar fasciitis. Also has some tenderness along the left Achilles tendon. The tendon is intact. No erythema or ecchymosis or nodule formation. Skin intact. Neurovascular exam intact. No calf pain. No swelling. No specialty comments available.  Imaging: No results found.   PMFS History: Patient Active Problem List   Diagnosis Date Noted  . Left foot pain 01/10/2017  . Osteoarthritis of both knees 12/01/2016  . Class 3 severe obesity due to excess calories without serious comorbidity with body mass index (BMI)  of 40.0 to 44.9 in adult [facility]) 10/01/2016  . High risk medication use 02/06/2016  . Family history of rheumatoid arthritis 02/06/2016  . H/O seasonal allergies 02/06/2016  . Rheumatoid arthritis of multiple sites with negative rheumatoid factor ([place]) 02/03/2016  . History of colon cancer 02/03/2016  . HLA B27 (HLA B27 positive) 02/03/2016   Past Medical History:  Diagnosis Date  . Cancer ([place]) 2001   cecum-chemo  . Rheumatoid arthritis ([place])   . Seasonal allergies     Family  History  Problem Relation Age of Onset  . Arthritis/Rheumatoid Mother   . Allergic rhinitis Father   . Angioedema Neg Hx   . Asthma Neg Hx   . Atopy Neg Hx   . Eczema Neg Hx   . Immunodeficiency Neg Hx   . Urticaria Neg Hx     Past Surgical History:  Procedure Laterality Date  . COLONOSCOPY  06/19/14  . PORT A CATH REVISION  2001   in and out  . RADIOACTIVE SEED LOCALIZATION LEFT BREAST LUMPECTOMY Left 06/28/2014   Performed by Excell Seltzer, MD at [facility]  . RIGHT COLECTOMY  2001   cecum cancer   Social History   Occupational History  . Not on file  Tobacco Use  . Smoking status: Former Smoker    Packs/day: 1.00    Years: 5.00    Pack years: 5.00    Types: Cigarettes    Last attempt to quit: 02/05/1998    Years since quitting: 18.9  . Smokeless tobacco: Never Used  Substance and Sexual Activity  . Alcohol use: Yes    Comment: occ  . Drug use: No  . Sexual activity: Not on file

## 2017-01-18 ENCOUNTER — Ambulatory Visit (INDEPENDENT_AMBULATORY_CARE_PROVIDER_SITE_OTHER): Payer: BLUE CROSS/BLUE SHIELD | Admitting: *Deleted

## 2017-01-18 DIAGNOSIS — J309 Allergic rhinitis, unspecified: Secondary | ICD-10-CM | POA: Diagnosis not present

## 2017-01-22 DIAGNOSIS — Z8614 Personal history of Methicillin resistant Staphylococcus aureus infection: Secondary | ICD-10-CM

## 2017-01-22 HISTORY — DX: Personal history of Methicillin resistant Staphylococcus aureus infection: Z86.14

## 2017-01-25 ENCOUNTER — Ambulatory Visit (INDEPENDENT_AMBULATORY_CARE_PROVIDER_SITE_OTHER): Payer: BLUE CROSS/BLUE SHIELD | Admitting: *Deleted

## 2017-01-25 DIAGNOSIS — J309 Allergic rhinitis, unspecified: Secondary | ICD-10-CM

## 2017-01-28 NOTE — Telephone Encounter (Signed)
Pending PA, Hyalgan, Bil, I called patient, patient purchase

## 2017-02-07 ENCOUNTER — Other Ambulatory Visit: Payer: Self-pay | Admitting: *Deleted

## 2017-02-07 MED ORDER — METHOTREXATE (PF) 20 MG/0.4ML ~~LOC~~ SOAJ: 1.0000 "pen " | SUBCUTANEOUS | 0 refills | Status: DC

## 2017-02-07 NOTE — Telephone Encounter (Signed)
Refill request received via fax   Last Visit: 12/01/16 Next Visit: 03/03/17 Labs: 12/01/16  WNL  Okay to refill per Dr. Estanislado Pandy

## 2017-02-11 ENCOUNTER — Telehealth: Payer: Self-pay

## 2017-02-11 NOTE — Telephone Encounter (Signed)
[facility] would like to know which Rx for the patient.  Otrexup or Rasuvo?  Cb# is [phone]. Reference# [id number].  Please advise.  Thank you.

## 2017-02-16 DIAGNOSIS — L738 Other specified follicular disorders: Secondary | ICD-10-CM | POA: Diagnosis not present

## 2017-02-17 ENCOUNTER — Ambulatory Visit (INDEPENDENT_AMBULATORY_CARE_PROVIDER_SITE_OTHER): Payer: BLUE CROSS/BLUE SHIELD | Admitting: *Deleted

## 2017-02-17 DIAGNOSIS — J309 Allergic rhinitis, unspecified: Secondary | ICD-10-CM | POA: Diagnosis not present

## 2017-02-17 NOTE — Telephone Encounter (Signed)
Called the pharmacy to clarify Rasuvo prescription.

## 2017-02-19 NOTE — Progress Notes (Deleted)
Office Visit Note  Patient: Dana Hicks             Date of Birth: 1975-07-08           MRN: [id number]             PCP: [name], MD Referring: [name], MD Visit Date: 03/03/2017 Occupation: '@GUAROCC' @    Subjective:  No chief complaint on file.   History of Present Illness: Dana Hicks is a 41 y.o. female ***   Activities of Daily Living:  Patient reports morning stiffness for *** {minute/hour:19697}.   Patient {ACTIONS;DENIES/REPORTS:21021675::"Denies"} nocturnal pain.  Difficulty dressing/grooming: {ACTIONS;DENIES/REPORTS:21021675::"Denies"} Difficulty climbing stairs: {ACTIONS;DENIES/REPORTS:21021675::"Denies"} Difficulty getting out of chair: {ACTIONS;DENIES/REPORTS:21021675::"Denies"} Difficulty using hands for taps, buttons, cutlery, and/or writing: {ACTIONS;DENIES/REPORTS:21021675::"Denies"}   No Rheumatology ROS completed.   PMFS History:  Patient Active Problem List   Diagnosis Date Noted  . Left foot pain 01/10/2017  . Osteoarthritis of both knees 12/01/2016  . Class 3 severe obesity due to excess calories without serious comorbidity with body mass index (BMI) of 40.0 to 44.9 in adult ([place]) 10/01/2016  . High risk medication use 02/06/2016  . Family history of rheumatoid arthritis 02/06/2016  . H/O seasonal allergies 02/06/2016  . Rheumatoid arthritis of multiple sites with negative rheumatoid factor ([place]) 02/03/2016  . History of colon cancer 02/03/2016  . HLA B27 (HLA B27 positive) 02/03/2016    Past Medical History:  Diagnosis Date  . Cancer ([place]) 2001   cecum-chemo  . Rheumatoid arthritis ([place])   . Seasonal allergies     Family History  Problem Relation Age of Onset  . Arthritis/Rheumatoid Mother   . Allergic rhinitis Father   . Angioedema Neg Hx   . Asthma Neg Hx   . Atopy Neg Hx   . Eczema Neg Hx   . Immunodeficiency Neg Hx   . Urticaria Neg Hx    Past Surgical History:  Procedure Laterality Date  . BREAST LUMPECTOMY  WITH RADIOACTIVE SEED LOCALIZATION Left 06/28/2014   Procedure: RADIOACTIVE SEED LOCALIZATION LEFT BREAST LUMPECTOMY;  Surgeon: Excell Seltzer, MD;  Location: [facility];  Service: General;  Laterality: Left;  . COLONOSCOPY  06/19/14  . PORT A CATH REVISION  2001   in and out  . RIGHT COLECTOMY  2001   cecum cancer   Social History   Social History Narrative  . Not on file     Objective: Vital Signs: There were no vitals taken for this visit.   Physical Exam   Musculoskeletal Exam: ***  CDAI Exam: No CDAI exam completed.    Investigation: No additional findings.TB Gold: 10/01/2016 Negative  CBC Latest Ref Rng & Units 12/01/2016 10/01/2016 07/02/2016  WBC 3.8 - 10.8 Thousand/uL 7.1 7.2 6.8  Hemoglobin 11.7 - 15.5 g/dL 12.2 13.2 13.1  Hematocrit 35.0 - 45.0 % 36.1 40.7 40.9  Platelets 140 - 400 Thousand/uL 263 300 295   CMP Latest Ref Rng & Units 12/01/2016 10/01/2016 07/02/2016  Glucose 65 - 99 mg/dL 86 88 85  BUN 7 - 25 mg/dL '12 12 9  ' Creatinine 0.50 - 1.10 mg/dL 0.68 0.70 0.65  Sodium 135 - 146 mmol/L 139 138 139  Potassium 3.5 - 5.3 mmol/L 4.0 4.2 4.5  Chloride 98 - 110 mmol/L 104 102 104  CO2 20 - 32 mmol/L '26 22 26  ' Calcium 8.6 - 10.2 mg/dL 8.8 9.1 8.9  Total Protein 6.1 - 8.1 g/dL 6.1 6.4 6.2  Total Bilirubin 0.2 - 1.2 mg/dL  0.3 0.5 0.4  Alkaline Phos 33 - 115 U/L - 45 37  AST 10 - 30 U/L '13 16 12  ' ALT 6 - 29 U/L '18 21 14   ' Imaging: No results found.  Speciality Comments: No specialty comments available.    Procedures:  No procedures performed Allergies: Codeine; Penicillins; and Bactrim [sulfamethoxazole-trimethoprim]   Assessment / Plan:     Visit Diagnoses: No diagnosis found.    Orders: No orders of the defined types were placed in this encounter.  No orders of the defined types were placed in this encounter.   Face-to-face time spent with patient was *** minutes. 50% of time was spent in counseling and coordination of  care.  Follow-Up Instructions: No Follow-up on file.   Earnestine Mealing, CMA  Note - This record has been created using Editor, commissioning.  Chart creation errors have been sought, but may not always  have been located. Such creation errors do not reflect on  the standard of medical care.

## 2017-02-25 ENCOUNTER — Telehealth: Payer: Self-pay | Admitting: Rheumatology

## 2017-02-25 NOTE — Telephone Encounter (Signed)
Patient called stating that she was diagnosed with MRSA and was put on an antibiotic.  She stopped taking her Orencia  and would like to get a prescription for Prednisone while she is treating the infection.  CB [phone]

## 2017-02-28 MED ORDER — PREDNISONE 5 MG PO TABS: ORAL_TABLET | ORAL | 0 refills | Status: DC

## 2017-02-28 NOTE — Telephone Encounter (Signed)
Prednisone 5 mg tablet, 4 tablets for 2 days, 3 tablets for 2 days, 2 tablets for 2 days, 1 tablet for 2 days.

## 2017-02-28 NOTE — Telephone Encounter (Signed)
Prescription sent to the pharmacy.  Patient notified.

## 2017-03-01 ENCOUNTER — Ambulatory Visit (INDEPENDENT_AMBULATORY_CARE_PROVIDER_SITE_OTHER): Payer: BLUE CROSS/BLUE SHIELD | Admitting: *Deleted

## 2017-03-01 DIAGNOSIS — J309 Allergic rhinitis, unspecified: Secondary | ICD-10-CM

## 2017-03-03 ENCOUNTER — Ambulatory Visit: Payer: BLUE CROSS/BLUE SHIELD | Admitting: Rheumatology

## 2017-03-10 ENCOUNTER — Ambulatory Visit (INDEPENDENT_AMBULATORY_CARE_PROVIDER_SITE_OTHER): Payer: BLUE CROSS/BLUE SHIELD | Admitting: *Deleted

## 2017-03-10 DIAGNOSIS — J309 Allergic rhinitis, unspecified: Secondary | ICD-10-CM | POA: Diagnosis not present

## 2017-03-11 ENCOUNTER — Encounter: Payer: Self-pay | Admitting: Allergy

## 2017-03-11 ENCOUNTER — Telehealth: Payer: Self-pay | Admitting: Allergy & Immunology

## 2017-03-11 ENCOUNTER — Ambulatory Visit: Payer: BLUE CROSS/BLUE SHIELD | Admitting: Allergy

## 2017-03-11 VITALS — BP 144/96 | HR 86

## 2017-03-11 DIAGNOSIS — T7840XA Allergy, unspecified, initial encounter: Secondary | ICD-10-CM

## 2017-03-11 NOTE — Telephone Encounter (Signed)
Order per Dr. Ernst Bowler of vial schedule and dosing noted in Immunotherapy flow sheet.  Note made to notify Dr. Ernst Bowler if patient does not tolerate so that vials could be remixed with less grasses.

## 2017-03-11 NOTE — Progress Notes (Signed)
Follow-up Note  RE: Dana Hicks MRN: [id number] DOB: Jan 13, 1976 Date of Office Visit: 03/11/2017   History of present illness: Dana Hicks is a 42 y.o. female presenting today for sick visit for allergic reaction following allergy shot injection.  She received her allergy shot injection on 03/10/2017 and received red vial 0.1 cc of grass, tree and ragweed in her left arm and mold in the right arm.  She waited her allotted 30 minutes and went home.  She states she was fine when she left the office.  She got home and ate dinner which was pizza and about an hour or so after the injection she started noticing that her hands and feet were itchy and red.  She took 2 benadryl.   She then realized her feet were swollen.  She also noticed hives around her wrist and in the area of her waistband.   She stayed up till midnight and still felt itchy and she took 2 more benadryl.  She states her dog woke up in the night and she took an additional 2 more benadryl.    She denies any difficulty breathing, GI or CV related symptoms however she does state that her throat "funny".   She says it felt as if it could be tightening but she was still breathing well without difficulty and was able to swallow without difficulty.  Her feet this morning are still swollen and she was unable to regular shoes on.   She does have about a half dollar size area on her left arm that is slightly raised and erythematous still.  She does feel though that her symptoms have improved since yesterday.      She states she did not take any antihistamines prior to her injection as she states her allergy symptoms have been better control allergy shots and has not needed to take her antihistamine regularly.  She states she does have access to her epinephrine device.  Review of systems: Review of Systems  Constitutional: Negative for chills, fever and malaise/fatigue.  HENT: Negative for congestion, ear discharge, ear pain, nosebleeds,  sinus pain and sore throat.   Eyes: Negative for pain, discharge and redness.  Respiratory: Negative for cough, shortness of breath and wheezing.   Cardiovascular: Negative for chest pain.  Gastrointestinal: Negative for abdominal pain, constipation, diarrhea, heartburn, nausea and vomiting.  Musculoskeletal: Negative for joint pain.  Skin: Positive for itching and rash.  Neurological: Negative for headaches.    All other systems negative unless noted above in HPI  Past medical/social/surgical/family history have been reviewed and are unchanged unless specifically indicated below.  No changes  Medication List: Allergies as of 03/11/2017      Reactions   Codeine Other (See Comments)   headaches   Penicillins    Severe headaches   Bactrim [sulfamethoxazole-trimethoprim] Rash      Medication List        Accurate as of 03/11/17  1:32 PM. Always use your most recent med list.          azelastine 0.1 % nasal spray Commonly known as:  ASTELIN   cetirizine 10 MG tablet Commonly known as:  ZYRTEC Take 10 mg by mouth daily.   diclofenac sodium 1 % Gel Commonly known as:  VOLTAREN Apply 3 grams to 3 large joints up to 3 times daily   Fish Oil 1000 MG Caps Take by mouth.   fluticasone 50 MCG/ACT nasal spray Commonly known as:  FLONASE Flonase 50  mcg/actuation nasal spray,suspension  Inhale 2 sprays every day by intranasal route for 30 days.   folic acid 1 MG tablet Commonly known as:  FOLVITE Take 2 mg by mouth daily.   Methotrexate (PF) 20 MG/0.4ML Soaj Inject 1 pen into the skin once a week.   ORENCIA CLICKJECT 676 MG/ML Soaj Generic drug:  Abatacept INJECT 125 MG INTO THE SKIN ONCE A WEEK   predniSONE 5 MG tablet Commonly known as:  DELTASONE 4x4d, 3x4d, 2x4d, 1x4d       Known medication allergies: Allergies  Allergen Reactions  . Codeine Other (See Comments)    headaches  . Penicillins     Severe headaches  . Bactrim [Sulfamethoxazole-Trimethoprim]  Rash     Physical examination: Blood pressure (!) 144/96, pulse 86, SpO2 99 %.  General: Alert, interactive, in no acute distress. HEENT: PERRLA, TMs pearly gray, turbinates non-edematous without discharge, post-pharynx non erythematous. Neck: Supple without lymphadenopathy. Lungs: Clear to auscultation without wheezing, rhonchi or rales. {no increased work of breathing. CV: Normal S1, S2 without murmurs. Abdomen: Nondistended, nontender. Skin: Half dollar size round erythematous patch on her left arm. Extremities:  No clubbing, cyanosis.  Feet bilaterally are edematous Neuro:   Grossly intact.  Diagnositics/Labs: None today  Assessment and plan:   Allergy shot reaction     -  Take Allegra 180mg  twice a day for the next 3 days along with either Zantac 150mg  or Pepcid 20mg  twice a day for next 3 days    - provided with prednisone 20mg  daily x 3 days to start if symptoms do not continue to improve throughout the day today    - take Allegra the day of your allergy shots    - will change to weekly injections and will repeat last tolerated dose (red 0.05).  Dr. Ernst Bowler will direct future build-up  Follow-up with Dr. Ernst Bowler as previously scheduled  I appreciate the opportunity to take part in Dana Hicks's care. Please do not hesitate to contact me with questions.  Sincerely,   Prudy Feeler, MD Allergy/Immunology [facility] [place] [facility]

## 2017-03-11 NOTE — Telephone Encounter (Signed)
I called Dana Hicks up to see how she was doing. She reports that Dr. Nelva Bush provided excellent care to her this morning. She is feeling better following her reaction. She tells me today that she neglected to take her antihistamine the morning prior to her injection. I reiterated the importance of this. She did fine with the 0.96mL of the Red Vial last week and feels much better being on shots. Therefore she would like to continue with these.  We will move her to Schedule A and decrease her to 0.66mL. We will repeat that dose next week and then the week after before advancing again. If she does not tolerate this, we will remix her vials with less grasses (including only the Grass Mix and [place]) and take out the walnut since it cross reacts with [employer]  Salvatore Marvel, MD [facility] [place]

## 2017-03-11 NOTE — Patient Instructions (Signed)
Allergy shot reaction     -  Take Allegra 180mg  twice a day for the next 3 days along with either Zantac 150mg  or Pepcid 20mg  twice a day for next 3 days    - provided with prednisone 20mg  daily x 3 days to start if symptoms do not continue to improve throughout the day today    - take Allegra the day of your allergy shots    - will change to weekly injections and will repeat last tolerated dose (red 0.05).  Dr. Ernst Bowler will direct future build-up  Follow-up with Dr. Ernst Bowler as previously scheduled

## 2017-03-17 ENCOUNTER — Ambulatory Visit (INDEPENDENT_AMBULATORY_CARE_PROVIDER_SITE_OTHER): Payer: BLUE CROSS/BLUE SHIELD | Admitting: *Deleted

## 2017-03-17 DIAGNOSIS — J309 Allergic rhinitis, unspecified: Secondary | ICD-10-CM | POA: Diagnosis not present

## 2017-03-18 ENCOUNTER — Telehealth: Payer: Self-pay | Admitting: Rheumatology

## 2017-03-18 MED ORDER — PREDNISONE 5 MG PO TABS: ORAL_TABLET | ORAL | 0 refills | Status: DC

## 2017-03-18 NOTE — Telephone Encounter (Signed)
Patient requesting a call back due to PCP taking her off all RA meds for two weeks now. Patient had prednisone , but due with that also. Please call to discuss.

## 2017-03-18 NOTE — Telephone Encounter (Signed)
Patient advise prescription for prednisone sent to the pharmacy.

## 2017-03-18 NOTE — Telephone Encounter (Signed)
Patient states she was seen by her PCP around Christmas because of a rash which tested positive or MRSA. Patient states she is now on her third round of Doxycycline. This time for 10 days. Patient has not been on her Orencia or MTX since this has started. Patient is requesting a prednisone taper to help her function and work until after she has completed the antibiotic and can return to taking Orencia and MTX. Please advise.

## 2017-03-18 NOTE — Telephone Encounter (Signed)
Ok to give prednisone taper as prescribed before.

## 2017-03-22 DIAGNOSIS — H1013 Acute atopic conjunctivitis, bilateral: Secondary | ICD-10-CM | POA: Diagnosis not present

## 2017-03-23 NOTE — Progress Notes (Deleted)
Subjective:    Patient ID: Dana Hicks, female    DOB: 1975-06-15, 42 y.o.   MRN: [id number]  No chief complaint on file.   HPI:  Dana Hicks is a 42 y.o. female who presents today at the request of her PCP's office for evaluation of recurrent MRSA infection.  Ms. Helwig was seen in primary care on 12/26 with a 2 month history of recurrent bumps on her inner thighs. She had taken a needle and popped one of the sites secondary to pain with the result of increased redness and gradual improvement. She denied fever and chills. One of the pustules was unroofed for culture that returned with MRSA. She was started on doxycycline. She does take Orencia and Methotrexate for he RA.      Allergies  Allergen Reactions  . Codeine Other (See Comments)    headaches  . Penicillins     Severe headaches  . Bactrim [Sulfamethoxazole-Trimethoprim] Rash      Outpatient Medications Prior to Visit  Medication Sig Dispense Refill  . azelastine (ASTELIN) 0.1 % nasal spray     . cetirizine (ZYRTEC) 10 MG tablet Take 10 mg by mouth daily.    . diclofenac sodium (VOLTAREN) 1 % GEL Apply 3 grams to 3 large joints up to 3 times daily 3 Tube 3  . fluticasone (FLONASE) 50 MCG/ACT nasal spray Flonase 50 mcg/actuation nasal spray,suspension  Inhale 2 sprays every day by intranasal route for 30 days.    . folic acid (FOLVITE) 1 MG tablet Take 2 mg by mouth daily.     . Methotrexate, PF, 20 MG/0.4ML SOAJ Inject 1 pen into the skin once a week. 12 pen 0  . Omega-3 Fatty Acids (FISH OIL) 1000 MG CAPS Take by mouth.    Dana Hicks CLICKJECT 481 MG/ML SOAJ INJECT 125 MG INTO THE SKIN ONCE A WEEK 4 Syringe 2  . predniSONE (DELTASONE) 5 MG tablet 4x4d, 3x4d, 2x4d, 1x4d 40 tablet 0   No facility-administered medications prior to visit.      Past Medical History:  Diagnosis Date  . Cancer ([place]) 2001   cecum-chemo  . Rheumatoid arthritis ([place])   . Seasonal allergies       Past Surgical History:    Procedure Laterality Date  . BREAST LUMPECTOMY WITH RADIOACTIVE SEED LOCALIZATION Left 06/28/2014   Procedure: RADIOACTIVE SEED LOCALIZATION LEFT BREAST LUMPECTOMY;  Surgeon: Excell Seltzer, MD;  Location: [facility];  Service: General;  Laterality: Left;  . COLONOSCOPY  06/19/14  . PORT A CATH REVISION  2001   in and out  . RIGHT COLECTOMY  2001   cecum cancer      Family History  Problem Relation Age of Onset  . Arthritis/Rheumatoid Mother   . Allergic rhinitis Father   . Angioedema Neg Hx   . Asthma Neg Hx   . Atopy Neg Hx   . Eczema Neg Hx   . Immunodeficiency Neg Hx   . Urticaria Neg Hx       Social History   Socioeconomic History  . Marital status: Married    Spouse name: Not on file  . Number of children: Not on file  . Years of education: Not on file  . Highest education level: Not on file  Social Needs  . Financial resource strain: Not on file  . Food insecurity - worry: Not on file  . Food insecurity - inability: Not on file  . Transportation needs - medical: Not  on file  . Transportation needs - non-medical: Not on file  Occupational History  . Not on file  Tobacco Use  . Smoking status: Former Smoker    Packs/day: 1.00    Years: 5.00    Pack years: 5.00    Types: Cigarettes    Last attempt to quit: 02/05/1998    Years since quitting: 19.1  . Smokeless tobacco: Never Used  Substance and Sexual Activity  . Alcohol use: Yes    Comment: occ  . Drug use: No  . Sexual activity: Not on file  Other Topics Concern  . Not on file  Social History Narrative  . Not on file      Review of Systems     Objective:    There were no vitals taken for this visit. Nursing note and vital signs reviewed.  Physical Exam  Constitutional: She is oriented to person, place, and time. She appears well-developed and well-nourished. No distress.  Cardiovascular: Normal rate, regular rhythm, normal heart sounds and intact distal pulses.   Pulmonary/Chest: Effort normal and breath sounds normal.  Neurological: She is alert and oriented to person, place, and time.  Skin: Skin is warm and dry.  Psychiatric: She has a normal mood and affect. Her behavior is normal. Judgment and thought content normal.        Assessment & Plan:   Problem List Items Addressed This Visit    None       I am having [name] maintain her Fish Oil, cetirizine, folic acid, diclofenac sodium, fluticasone, azelastine, ORENCIA CLICKJECT, Methotrexate (PF), and predniSONE.   No orders of the defined types were placed in this encounter.    Follow-up: No Follow-up on file.  Mauricio Po, [facility]

## 2017-03-24 ENCOUNTER — Ambulatory Visit: Payer: BLUE CROSS/BLUE SHIELD | Admitting: Family

## 2017-03-24 ENCOUNTER — Ambulatory Visit (INDEPENDENT_AMBULATORY_CARE_PROVIDER_SITE_OTHER): Payer: BLUE CROSS/BLUE SHIELD | Admitting: *Deleted

## 2017-03-24 ENCOUNTER — Telehealth: Payer: Self-pay | Admitting: Family

## 2017-03-24 DIAGNOSIS — J309 Allergic rhinitis, unspecified: Secondary | ICD-10-CM | POA: Diagnosis not present

## 2017-03-24 NOTE — Telephone Encounter (Signed)
called pt to resch and patient said that appt is no longer needed, she went thrugh a treatment at her doctor's office and if it comes back she will back and resch [place]

## 2017-03-29 DIAGNOSIS — Z01419 Encounter for gynecological examination (general) (routine) without abnormal findings: Secondary | ICD-10-CM | POA: Diagnosis not present

## 2017-03-29 DIAGNOSIS — Z124 Encounter for screening for malignant neoplasm of cervix: Secondary | ICD-10-CM | POA: Diagnosis not present

## 2017-03-29 DIAGNOSIS — J302 Other seasonal allergic rhinitis: Secondary | ICD-10-CM | POA: Insufficient documentation

## 2017-03-29 DIAGNOSIS — Z1231 Encounter for screening mammogram for malignant neoplasm of breast: Secondary | ICD-10-CM | POA: Diagnosis not present

## 2017-03-29 DIAGNOSIS — Z6841 Body Mass Index (BMI) 40.0 and over, adult: Secondary | ICD-10-CM | POA: Diagnosis not present

## 2017-03-31 ENCOUNTER — Ambulatory Visit (INDEPENDENT_AMBULATORY_CARE_PROVIDER_SITE_OTHER): Payer: BLUE CROSS/BLUE SHIELD | Admitting: *Deleted

## 2017-03-31 DIAGNOSIS — J309 Allergic rhinitis, unspecified: Secondary | ICD-10-CM | POA: Diagnosis not present

## 2017-03-31 NOTE — Progress Notes (Deleted)
Office Visit Note  Patient: Dana Hicks             Date of Birth: 11/27/1975           MRN: [id number]             PCP: [name], MD Referring: [name], MD Visit Date: 04/14/2017 Occupation: '@GUAROCC' @    Subjective:  No chief complaint on file.   History of Present Illness: Dana Hicks is a 42 y.o. female ***   Activities of Daily Living:  Patient reports morning stiffness for *** {minute/hour:19697}.   Patient {ACTIONS;DENIES/REPORTS:21021675::"Denies"} nocturnal pain.  Difficulty dressing/grooming: {ACTIONS;DENIES/REPORTS:21021675::"Denies"} Difficulty climbing stairs: {ACTIONS;DENIES/REPORTS:21021675::"Denies"} Difficulty getting out of chair: {ACTIONS;DENIES/REPORTS:21021675::"Denies"} Difficulty using hands for taps, buttons, cutlery, and/or writing: {ACTIONS;DENIES/REPORTS:21021675::"Denies"}   No Rheumatology ROS completed.   PMFS History:  Patient Active Problem List   Diagnosis Date Noted  . Left foot pain 01/10/2017  . Osteoarthritis of both knees 12/01/2016  . Class 3 severe obesity due to excess calories without serious comorbidity with body mass index (BMI) of 40.0 to 44.9 in adult ([place]) 10/01/2016  . High risk medication use 02/06/2016  . Family history of rheumatoid arthritis 02/06/2016  . H/O seasonal allergies 02/06/2016  . Rheumatoid arthritis of multiple sites with negative rheumatoid factor ([place]) 02/03/2016  . History of colon cancer 02/03/2016  . HLA B27 (HLA B27 positive) 02/03/2016    Past Medical History:  Diagnosis Date  . Cancer ([place]) 2001   cecum-chemo  . Rheumatoid arthritis ([place])   . Seasonal allergies     Family History  Problem Relation Age of Onset  . Arthritis/Rheumatoid Mother   . Allergic rhinitis Father   . Angioedema Neg Hx   . Asthma Neg Hx   . Atopy Neg Hx   . Eczema Neg Hx   . Immunodeficiency Neg Hx   . Urticaria Neg Hx    Past Surgical History:  Procedure Laterality Date  . BREAST LUMPECTOMY  WITH RADIOACTIVE SEED LOCALIZATION Left 06/28/2014   Procedure: RADIOACTIVE SEED LOCALIZATION LEFT BREAST LUMPECTOMY;  Surgeon: Excell Seltzer, MD;  Location: [facility];  Service: General;  Laterality: Left;  . COLONOSCOPY  06/19/14  . PORT A CATH REVISION  2001   in and out  . RIGHT COLECTOMY  2001   cecum cancer   Social History   Social History Narrative  . Not on file     Objective: Vital Signs: There were no vitals taken for this visit.   Physical Exam   Musculoskeletal Exam: ***  CDAI Exam: No CDAI exam completed.    Investigation: No additional findings.TB Gold: 10/01/2016 Negative  CBC Latest Ref Rng & Units 12/01/2016 10/01/2016 07/02/2016  WBC 3.8 - 10.8 Thousand/uL 7.1 7.2 6.8  Hemoglobin 11.7 - 15.5 g/dL 12.2 13.2 13.1  Hematocrit 35.0 - 45.0 % 36.1 40.7 40.9  Platelets 140 - 400 Thousand/uL 263 300 295   CMP Latest Ref Rng & Units 12/01/2016 10/01/2016 07/02/2016  Glucose 65 - 99 mg/dL 86 88 85  BUN 7 - 25 mg/dL '12 12 9  ' Creatinine 0.50 - 1.10 mg/dL 0.68 0.70 0.65  Sodium 135 - 146 mmol/L 139 138 139  Potassium 3.5 - 5.3 mmol/L 4.0 4.2 4.5  Chloride 98 - 110 mmol/L 104 102 104  CO2 20 - 32 mmol/L '26 22 26  ' Calcium 8.6 - 10.2 mg/dL 8.8 9.1 8.9  Total Protein 6.1 - 8.1 g/dL 6.1 6.4 6.2  Total Bilirubin 0.2 - 1.2 mg/dL  0.3 0.5 0.4  Alkaline Phos 33 - 115 U/L - 45 37  AST 10 - 30 U/L '13 16 12  ' ALT 6 - 29 U/L '18 21 14    ' Imaging: No results found.  Speciality Comments: No specialty comments available.    Procedures:  No procedures performed Allergies: Codeine; Penicillins; and Bactrim [sulfamethoxazole-trimethoprim]   Assessment / Plan:     Visit Diagnoses: No diagnosis found.    Orders: No orders of the defined types were placed in this encounter.  No orders of the defined types were placed in this encounter.   Face-to-face time spent with patient was *** minutes. 50% of time was spent in counseling and coordination of  care.  Follow-Up Instructions: No Follow-up on file.   Earnestine Mealing, CMA  Note - This record has been created using Editor, commissioning.  Chart creation errors have been sought, but may not always  have been located. Such creation errors do not reflect on  the standard of medical care.

## 2017-04-07 DIAGNOSIS — M545 Low back pain: Secondary | ICD-10-CM | POA: Diagnosis not present

## 2017-04-12 ENCOUNTER — Ambulatory Visit (INDEPENDENT_AMBULATORY_CARE_PROVIDER_SITE_OTHER): Payer: BLUE CROSS/BLUE SHIELD | Admitting: *Deleted

## 2017-04-12 DIAGNOSIS — J309 Allergic rhinitis, unspecified: Secondary | ICD-10-CM | POA: Diagnosis not present

## 2017-04-13 ENCOUNTER — Telehealth: Payer: Self-pay

## 2017-04-13 NOTE — Progress Notes (Addendum)
Office Visit Note  Patient: Dana Hicks             Date of Birth: 06/07/1975           MRN: [id number]             PCP: [name], MD Referring: [name], MD Visit Date: 04/27/2017 Occupation: '@GUAROCC' @    Subjective:  Bilateral knee pain   History of Present Illness: Dana Hicks is a 42 y.o. female with history of seronegative rheumatoid arthritis and osteoarthritis.  Patient states that over the past month she has had several health concerns.  She recently saw her ophthalmologist who found inflammation and scar tissue in the lacrimal duct.  Patient reports she is unsure if this is related to her RA. Patient states she is using steroid drops, which do not seem to be helping. She reports she recently saw Dr. Durward Fortes for management of bilateral plantar fasciitis.  She has been doing exercises at home.  She has also been wearing a night splint, and she is finally noticing improvement.     She reports she recently strained a back muscle and was put on prednisone 15 mg and muscle relaxer.  She is still on the prednisone and is supposed to taper to 10 mg soon.  She has also been doing back stretches at home.  She states that her RA has been very well controlled while being on Prednisone.  She states her left knee continues to cause discomfort.  She takes tylenol arthritis twice daily.  She denies any other joint pain or joint swelling.  She states her right elbow continues to be warm but is not tender.  Her contracture has improved.  She reports she recently had a MRSA infection on the medial aspect of her bilateral thighs.  She underwent three rounds of Doxycyline.  She reports the lesions have cleared.  She has been off of her Orencia for 1 month and her MTX for 2 weeks.    Activities of Daily Living:  Patient reports morning stiffness for 30  minutes.   Patient Denies nocturnal pain.  Difficulty dressing/grooming: Denies Difficulty climbing stairs: Reports Difficulty  getting out of chair: Reports Difficulty using hands for taps, buttons, cutlery, and/or writing: Denies   Review of Systems  Constitutional: Negative for fatigue and weakness.  HENT: Negative for mouth sores, trouble swallowing, trouble swallowing, mouth dryness and nose dryness.   Eyes: Negative for pain, redness, visual disturbance and dryness.  Respiratory: Negative for cough, hemoptysis, shortness of breath and difficulty breathing.   Cardiovascular: Negative for chest pain, palpitations, hypertension, irregular heartbeat and swelling in legs/feet.  Gastrointestinal: Negative for blood in stool, constipation and diarrhea.  Endocrine: Negative for increased urination.  Genitourinary: Negative for painful urination.  Musculoskeletal: Positive for arthralgias, joint pain and morning stiffness. Negative for joint swelling, myalgias, muscle weakness, muscle tenderness and myalgias.  Skin: Negative for color change, pallor, rash, hair loss, nodules/bumps, redness, skin tightness, ulcers and sensitivity to sunlight.  Allergic/Immunologic: Negative for susceptible to infections.  Neurological: Negative for dizziness, numbness and headaches.  Hematological: Negative for swollen glands.  Psychiatric/Behavioral: Negative for depressed mood and sleep disturbance. The patient is not nervous/anxious.     PMFS History:  Patient Active Problem List   Diagnosis Date Noted  . Left foot pain 01/10/2017  . Osteoarthritis of both knees 12/01/2016  . Class 3 severe obesity due to excess calories without serious comorbidity with body mass index (BMI) of 40.0  to 44.9 in adult [facility]) 10/01/2016  . High risk medication use 02/06/2016  . Family history of rheumatoid arthritis 02/06/2016  . H/O seasonal allergies 02/06/2016  . Rheumatoid arthritis of multiple sites with negative rheumatoid factor ([place]) 02/03/2016  . History of colon cancer 02/03/2016  . HLA B27 (HLA B27 positive) 02/03/2016    Past Medical  History:  Diagnosis Date  . Cancer ([place]) 2001   cecum-chemo  . Rheumatoid arthritis ([place])   . Seasonal allergies     Family History  Problem Relation Age of Onset  . Arthritis/Rheumatoid Mother   . Allergic rhinitis Father   . Angioedema Neg Hx   . Asthma Neg Hx   . Atopy Neg Hx   . Eczema Neg Hx   . Immunodeficiency Neg Hx   . Urticaria Neg Hx    Past Surgical History:  Procedure Laterality Date  . BREAST LUMPECTOMY WITH RADIOACTIVE SEED LOCALIZATION Left 06/28/2014   Procedure: RADIOACTIVE SEED LOCALIZATION LEFT BREAST LUMPECTOMY;  Surgeon: Excell Seltzer, MD;  Location: [place];  Service: General;  Laterality: Left;  . COLONOSCOPY  06/19/14  . PORT A CATH REVISION  2001   in and out  . RIGHT COLECTOMY  2001   cecum cancer   Social History   Social History Narrative  . Not on file     Objective: Vital Signs: BP (!) 139/93 (BP Location: Left Arm, Patient Position: Sitting, Cuff Size: Normal)   Pulse 90   Resp 16   Ht '5\' 2"'  (1.575 m)   Wt 242 lb (109.8 kg)   BMI 44.26 kg/m    Physical Exam  Constitutional: She is oriented to person, place, and time. She appears well-developed and well-nourished.  HENT:  Head: Normocephalic and atraumatic.  Eyes: Conjunctivae and EOM are normal.  Neck: Normal range of motion.  Cardiovascular: Normal rate, regular rhythm, normal heart sounds and intact distal pulses.  Pulmonary/Chest: Effort normal and breath sounds normal.  Abdominal: Soft. Bowel sounds are normal.  Lymphadenopathy:    She has no cervical adenopathy.  Neurological: She is alert and oriented to person, place, and time.  Skin: Skin is warm and dry. Capillary refill takes less than 2 seconds.  Psychiatric: She has a normal mood and affect. Her behavior is normal.  Nursing note and vitals reviewed.    Musculoskeletal Exam: C-spine, thoracic, and lumbar spine good ROM. No midline spinal tenderness.  No SI joint tenderness.  Shoulder joints,  elbow joints, wrist joints, MCPs, PIPs, and DIPs good ROM with no synovitis or tenderness.  Hip joints, knee joints, ankle joints, MTPs, PIPs, and DIPs good ROM with no synovitis.  Left knee warmth.  No effusion or crepitus in knees.  No trochanteric bursa tenderness.     CDAI Exam: CDAI Homunculus Exam:   Tenderness:  LLE: tibiofemoral  Joint Counts:  CDAI Tender Joint count: 1 CDAI Swollen Joint count: 0  Global Assessments:  Patient Global Assessment: 5   CDAI Calculated Score: 6    Investigation: No additional findings. CBC Latest Ref Rng & Units 12/01/2016 10/01/2016 07/02/2016  WBC 3.8 - 10.8 Thousand/uL 7.1 7.2 6.8  Hemoglobin 11.7 - 15.5 g/dL 12.2 13.2 13.1  Hematocrit 35.0 - 45.0 % 36.1 40.7 40.9  Platelets 140 - 400 Thousand/uL 263 300 295   CMP Latest Ref Rng & Units 12/01/2016 10/01/2016 07/02/2016  Glucose 65 - 99 mg/dL 86 88 85  BUN 7 - 25 mg/dL '12 12 9  ' Creatinine 0.50 - 1.10  mg/dL 0.68 0.70 0.65  Sodium 135 - 146 mmol/L 139 138 139  Potassium 3.5 - 5.3 mmol/L 4.0 4.2 4.5  Chloride 98 - 110 mmol/L 104 102 104  CO2 20 - 32 mmol/L '26 22 26  ' Calcium 8.6 - 10.2 mg/dL 8.8 9.1 8.9  Total Protein 6.1 - 8.1 g/dL 6.1 6.4 6.2  Total Bilirubin 0.2 - 1.2 mg/dL 0.3 0.5 0.4  Alkaline Phos 33 - 115 U/L - 45 37  AST 10 - 30 U/L '13 16 12  ' ALT 6 - 29 U/L '18 21 14    ' Imaging: No results found.  Speciality Comments: No specialty comments available.    Procedures:  No procedures performed Allergies: Codeine; Penicillins; and Bactrim [sulfamethoxazole-trimethoprim]        Assessment / Plan:     Visit Diagnoses: Rheumatoid arthritis of multiple sites with negative rheumatoid factor ([place]): She has no synovitis or tenderness on exam.  She is currently on Prednisone 15 mg.  She has been off of her Orencia for 1 month and off of her MTX for 2 weeks.  She was recently put on Prednisone for management of back pain.  She also recently had a MRSA infection on the medial aspect  of bilateral thighs, which have since healed with three rounds of Doxycyline.  She reports she tried to restart her MTX 2 weeks ago and a few lesions reappeared so she skipped her last dose and has no resumed Orencia.  She was advised to start using antibacterial body wash.  Since all of her lesions have healed, she can restart Methotrexate.  If she continues to do well on MTX with no new lesions, she can resume Orencia in 2 months.   Patient reports she has been seeing her ophthalmologist for "inflammation of the lacrimal duct." Per patient she has scar tissue removed from the lacrimal duct. Advised patient to have ophthalmologist send most recent office note. She is using a prednisone eye drop, which has not been helping.   High risk medication use - Orencia clickjet, MTX, folic acid 2 mg. CBC/CMP 12/01/2016.  CBC and CMP were drawn today to monitor for drug toxicity. TB gold 10/01/16. - Plan: CBC with Differential/Platelet, COMPLETE METABOLIC PANEL WITH GFR, COMPLETE METABOLIC PANEL WITH GFR, CBC with Differential/Platelet  Primary osteoarthritis of both knees: She has warmth of her left knee.  No effusion or crepitus noted.  She reports very little discomfort at this time.  Her left knee has been feeling better since being on Prednisone.  She is considering getting a left knee total arthroplasty.   HLA B27 (HLA B27 positive)  Family history of rheumatoid arthritis  History of colon cancer - Colon resection in 2001  H/O seasonal allergies  Class 3 severe obesity due to excess calories without serious comorbidity with body mass index (BMI) of 40.0 to 44.9 in adult [facility])    Orders: Orders Placed This Encounter  Procedures  . CBC with Differential/Platelet  . COMPLETE METABOLIC PANEL WITH GFR   Meds ordered this encounter  Medications  . diclofenac sodium (VOLTAREN) 1 % GEL    Sig: Apply 3 grams to 3 large joints up to 3 times daily    Dispense:  3 Tube    Refill:  3    Face-to-face  time spent with patient was 30 minutes. >50% of time was spent in counseling and coordination of care.  Follow-Up Instructions: Return for Rheumatoid arthritis.   Ofilia Neas, PA-C  I examined and evaluated  the patient with Hazel Sams PA.Patient had warmth on palpation of her left knee. The plan of care was discussed as noted above.  Bo Merino, MD Note - This record has been created using Editor, commissioning.  Chart creation errors have been sought, but may not always  have been located. Such creation errors do not reflect on  the standard of medical care.

## 2017-04-13 NOTE — Telephone Encounter (Signed)
Patient had an injection on last night and she had an allergic reaction. She would like to speak to a nurse about her reaction.  Please Advise

## 2017-04-13 NOTE — Telephone Encounter (Signed)
Spoke to patient states she received her allergy shots 04/12/17 @ 6:15p she left the office at 6:45p with itching at the site. When she got home she had a delayed reaction @ 9:15-9:30p states her palms started itching and her feet were itching, got a hive around her wrist and her feet had swelling. Patient did take her Allegra the morning of her injection then when symptoms occurred she took another Allegra in 1 hr she started feeling better. Please advise on how to proceed with injections this is her 2nd reaction while immunotherapy.

## 2017-04-14 ENCOUNTER — Ambulatory Visit: Payer: BLUE CROSS/BLUE SHIELD | Admitting: Family

## 2017-04-14 ENCOUNTER — Telehealth: Payer: Self-pay | Admitting: Allergy & Immunology

## 2017-04-14 ENCOUNTER — Ambulatory Visit: Payer: BLUE CROSS/BLUE SHIELD | Admitting: Rheumatology

## 2017-04-14 DIAGNOSIS — J302 Other seasonal allergic rhinitis: Secondary | ICD-10-CM

## 2017-04-14 DIAGNOSIS — J3089 Other allergic rhinitis: Principal | ICD-10-CM

## 2017-04-14 NOTE — Telephone Encounter (Signed)
Pt called back today and wanted to know why you have not call her about her reaction  with her injection. She said that she has a sore throat from the reaction.

## 2017-04-15 MED ORDER — PREDNISONE 10 MG PO TABS: 20.0000 mg | ORAL_TABLET | Freq: Two times a day (BID) | ORAL | 0 refills | Status: AC

## 2017-04-15 NOTE — Telephone Encounter (Signed)
I am out of town and was under the impression that Everlene Farrier had spoken to her. We are going to remix her Grass/RW/Tree prescription since this seems to be the most reactive one. I am going to put in that script now. We can keep the mold vial where it is now and go from there.   I will remix the pollen vial and we can start it as a new Red Vial essentially on Schedule A.   Salvatore Marvel, MD All[facility] and [place] [facility] [place]

## 2017-04-15 NOTE — Telephone Encounter (Signed)
Called and spoke with patient and informed her of the remix of her vial and offered the prednisone. She stated that she was congested and would like to have that Rx sent in. I have sent it in.

## 2017-04-18 NOTE — Telephone Encounter (Signed)
Called patient and informed her we would have her new vial next week.  Patient is still congested and states she has been around a lot of sick people.  Patient will contact the office if she worsens in any way.

## 2017-04-20 ENCOUNTER — Encounter: Payer: Self-pay | Admitting: *Deleted

## 2017-04-20 DIAGNOSIS — J302 Other seasonal allergic rhinitis: Secondary | ICD-10-CM | POA: Diagnosis not present

## 2017-04-20 NOTE — Progress Notes (Signed)
Maintenance vial made. Exp: 04-20-18. HV

## 2017-04-26 ENCOUNTER — Ambulatory Visit (INDEPENDENT_AMBULATORY_CARE_PROVIDER_SITE_OTHER): Payer: BLUE CROSS/BLUE SHIELD | Admitting: *Deleted

## 2017-04-26 DIAGNOSIS — J309 Allergic rhinitis, unspecified: Secondary | ICD-10-CM | POA: Diagnosis not present

## 2017-04-27 ENCOUNTER — Ambulatory Visit: Payer: BLUE CROSS/BLUE SHIELD | Admitting: Rheumatology

## 2017-04-27 ENCOUNTER — Encounter: Payer: Self-pay | Admitting: Physician Assistant

## 2017-04-27 VITALS — BP 139/93 | HR 90 | Resp 16 | Ht 62.0 in | Wt 242.0 lb

## 2017-04-27 DIAGNOSIS — M0609 Rheumatoid arthritis without rheumatoid factor, multiple sites: Secondary | ICD-10-CM

## 2017-04-27 DIAGNOSIS — Z8261 Family history of arthritis: Secondary | ICD-10-CM

## 2017-04-27 DIAGNOSIS — Z889 Allergy status to unspecified drugs, medicaments and biological substances status: Secondary | ICD-10-CM

## 2017-04-27 DIAGNOSIS — M170 Bilateral primary osteoarthritis of knee: Secondary | ICD-10-CM | POA: Diagnosis not present

## 2017-04-27 DIAGNOSIS — Z79899 Other long term (current) drug therapy: Secondary | ICD-10-CM

## 2017-04-27 DIAGNOSIS — Z1589 Genetic susceptibility to other disease: Secondary | ICD-10-CM | POA: Diagnosis not present

## 2017-04-27 DIAGNOSIS — Z85038 Personal history of other malignant neoplasm of large intestine: Secondary | ICD-10-CM | POA: Diagnosis not present

## 2017-04-27 DIAGNOSIS — Z6841 Body Mass Index (BMI) 40.0 and over, adult: Secondary | ICD-10-CM

## 2017-04-27 LAB — CBC WITH DIFFERENTIAL/PLATELET
Basophils Absolute: 60 cells/uL (ref 0–200)
Basophils Relative: 0.5 %
Eosinophils Absolute: 96 cells/uL (ref 15–500)
Eosinophils Relative: 0.8 %
HCT: 38.1 % (ref 35.0–45.0)
Hemoglobin: 12.7 g/dL (ref 11.7–15.5)
Lymphs Abs: 3588 cells/uL (ref 850–3900)
MCH: 29.5 pg (ref 27.0–33.0)
MCHC: 33.3 g/dL (ref 32.0–36.0)
MCV: 88.4 fL (ref 80.0–100.0)
MPV: 11.9 fL (ref 7.5–12.5)
Monocytes Relative: 6.5 %
Neutro Abs: 7476 cells/uL (ref 1500–7800)
Neutrophils Relative %: 62.3 %
Platelets: 283 10*3/uL (ref 140–400)
RBC: 4.31 10*6/uL (ref 3.80–5.10)
RDW: 13.2 % (ref 11.0–15.0)
Total Lymphocyte: 29.9 %
WBC mixed population: 780 cells/uL (ref 200–950)
WBC: 12 10*3/uL — ABNORMAL HIGH (ref 3.8–10.8)

## 2017-04-27 LAB — COMPLETE METABOLIC PANEL WITH GFR
AG Ratio: 1.4 (calc) (ref 1.0–2.5)
ALT: 16 U/L (ref 6–29)
AST: 9 U/L — ABNORMAL LOW (ref 10–30)
Albumin: 3.8 g/dL (ref 3.6–5.1)
Alkaline phosphatase (APISO): 39 U/L (ref 33–115)
BUN: 13 mg/dL (ref 7–25)
CO2: 28 mmol/L (ref 20–32)
Calcium: 9.1 mg/dL (ref 8.6–10.2)
Chloride: 102 mmol/L (ref 98–110)
Creat: 0.75 mg/dL (ref 0.50–1.10)
GFR, Est African American: 112 mL/min/{1.73_m2} (ref >=60)
GFR, Est Non African American: 97 mL/min/{1.73_m2} (ref >=60)
Globulin: 2.7 g/dL (calc) (ref 1.9–3.7)
Glucose, Bld: 80 mg/dL (ref 65–99)
Potassium: 4.1 mmol/L (ref 3.5–5.3)
Sodium: 139 mmol/L (ref 135–146)
Total Bilirubin: 0.4 mg/dL (ref 0.2–1.2)
Total Protein: 6.5 g/dL (ref 6.1–8.1)

## 2017-04-27 MED ORDER — DICLOFENAC SODIUM 1 % TD GEL: TRANSDERMAL | 3 refills | Status: DC

## 2017-04-28 LAB — CBC WITH DIFFERENTIAL/PLATELET

## 2017-04-28 NOTE — Progress Notes (Signed)
WBC elevated.  She is currently on Prednisone.

## 2017-05-02 ENCOUNTER — Other Ambulatory Visit: Payer: Self-pay | Admitting: Rheumatology

## 2017-05-02 NOTE — Telephone Encounter (Signed)
Last Visit: 04/27/17 Next Visit: 07/28/17  Okay to refill per Dr. Estanislado Pandy

## 2017-05-05 ENCOUNTER — Ambulatory Visit (INDEPENDENT_AMBULATORY_CARE_PROVIDER_SITE_OTHER): Payer: BLUE CROSS/BLUE SHIELD | Admitting: *Deleted

## 2017-05-05 DIAGNOSIS — J309 Allergic rhinitis, unspecified: Secondary | ICD-10-CM

## 2017-05-09 DIAGNOSIS — H04302 Unspecified dacryocystitis of left lacrimal passage: Secondary | ICD-10-CM | POA: Insufficient documentation

## 2017-05-09 DIAGNOSIS — J3089 Other allergic rhinitis: Secondary | ICD-10-CM | POA: Insufficient documentation

## 2017-05-10 ENCOUNTER — Telehealth: Payer: Self-pay | Admitting: Rheumatology

## 2017-05-10 ENCOUNTER — Ambulatory Visit (INDEPENDENT_AMBULATORY_CARE_PROVIDER_SITE_OTHER): Payer: BLUE CROSS/BLUE SHIELD | Admitting: *Deleted

## 2017-05-10 ENCOUNTER — Other Ambulatory Visit: Payer: Self-pay | Admitting: *Deleted

## 2017-05-10 DIAGNOSIS — J309 Allergic rhinitis, unspecified: Secondary | ICD-10-CM | POA: Diagnosis not present

## 2017-05-10 MED ORDER — METHOTREXATE (PF) 20 MG/0.4ML ~~LOC~~ SOAJ: 1.0000 "pen " | SUBCUTANEOUS | 0 refills | Status: DC

## 2017-05-10 MED ORDER — PREDNISONE 5 MG PO TABS: ORAL_TABLET | ORAL | 0 refills | Status: DC

## 2017-05-10 NOTE — Telephone Encounter (Signed)
Refill request received via fax  Last Visit: 04/27/17 Next Visit: 07/28/17 Labs: 04/27/17 WBC elevated. Currently on prednisone  Okay to refill per Dr. Estanislado Pandy

## 2017-05-10 NOTE — Telephone Encounter (Signed)
Left message to advise patient prescription for prednisone has been sent to the pharmacy.

## 2017-05-10 NOTE — Telephone Encounter (Signed)
Patient called stating that her MRSA has returned and is scheduled to see an infectious disease doctor at [facility] on 3/21.  Patient states that she is unable to take her Methotrexate and Orencia but was hoping Dr. Estanislado Pandy could give her Prednisone until she can get back on the medications.

## 2017-05-10 NOTE — Telephone Encounter (Signed)
Okay to call in prednisone 5 mg tablet, start at 10 mg daily for 1 week taper by 2.5 mg every week.

## 2017-05-12 ENCOUNTER — Encounter: Payer: Self-pay | Admitting: Family

## 2017-05-12 ENCOUNTER — Ambulatory Visit: Payer: BLUE CROSS/BLUE SHIELD | Admitting: Family

## 2017-05-12 VITALS — BP 147/87 | HR 71 | Temp 97.6°F | Ht 62.0 in | Wt 239.0 lb

## 2017-05-12 DIAGNOSIS — Z22322 Carrier or suspected carrier of Methicillin resistant Staphylococcus aureus: Secondary | ICD-10-CM

## 2017-05-12 MED ORDER — MUPIROCIN 2 % EX OINT: 1.0000 "application " | TOPICAL_OINTMENT | Freq: Two times a day (BID) | CUTANEOUS | 0 refills | Status: DC

## 2017-05-12 NOTE — Assessment & Plan Note (Signed)
Appears she has colonization with MRSA with no active infection currently. There is no indication for antibiotics at this time. Recommend decolonization with mupiricin for the nose and chlorhexadine 4% washes for 7 days. Information also provided about diluted bleach baths. Follow up if symptoms worsen or do not improve.

## 2017-05-12 NOTE — Progress Notes (Signed)
Subjective:    Patient ID: Dana Hicks, female    DOB: 08/09/1975, 42 y.o.   MRN: [id number]  Chief Complaint  Patient presents with  . Recurrent Skin Infections    HPI:  Dana Hicks is a 42 y.o. female with a PMH of rheumatoid arthritis and seasonal allergies who presents today for evaluation of recurrent MRSA skin infections.  Initial symptoms started in December of 2018 and has been waxing and waning for about 1 a week at a time with lesions last for about 1-2 days. Was seen in primary care and had a lesion lanced and drained with culture showing MRSA. Has completed 3 courses of doxycycline since the initial breakout with no significant improvement. She is on Orencia for RA, but was switched to prednisone secondary to other co-morbidities temporarily. No other modifying or attempted treatments. Denies any fevers, chills, night sweats, weight loss.   Allergies  Allergen Reactions  . Codeine Other (See Comments)    headaches  . Penicillins     Severe headaches  . Bactrim [Sulfamethoxazole-Trimethoprim] Rash      Outpatient Medications Prior to Visit  Medication Sig Dispense Refill  . azelastine (ASTELIN) 0.1 % nasal spray     . cetirizine (ZYRTEC) 10 MG tablet Take 10 mg by mouth daily.    . diclofenac sodium (VOLTAREN) 1 % GEL APPLY 3 GM TO AFFECTED 3 LARGE JOINTS UP TO 3 TIMES A DAY AS NEEDED AS DIRECTED 300 g 1  . fluticasone (FLONASE) 50 MCG/ACT nasal spray Flonase 50 mcg/actuation nasal spray,suspension  Inhale 2 sprays every day by intranasal route for 30 days.    . folic acid (FOLVITE) 1 MG tablet Take 2 mg by mouth daily.     . predniSONE (DELTASONE) 5 MG tablet Take 2 tabs po x 1 week, 1.5 tabs x 1 week, 1 tab x 1 week, 0.5 tab x 1 week 35 tablet 0  . Methotrexate, PF, 20 MG/0.4ML SOAJ Inject 1 pen into the skin once a week. (Patient not taking: Reported on 05/12/2017) 12 pen 0  . Omega-3 Fatty Acids (FISH OIL) 1000 MG CAPS Take by mouth.    [name]  CLICKJECT 737 MG/ML SOAJ INJECT 125 MG INTO THE SKIN ONCE A WEEK (Patient not taking: Reported on 05/12/2017) 4 Syringe 2   No facility-administered medications prior to visit.      Past Medical History:  Diagnosis Date  . Cancer ([place]) 2001   cecum-chemo  . Rheumatoid arthritis ([place])   . Seasonal allergies       Past Surgical History:  Procedure Laterality Date  . BREAST LUMPECTOMY WITH RADIOACTIVE SEED LOCALIZATION Left 06/28/2014   Procedure: RADIOACTIVE SEED LOCALIZATION LEFT BREAST LUMPECTOMY;  Surgeon: Excell Seltzer, MD;  Location: [place];  Service: General;  Laterality: Left;  . COLONOSCOPY  06/19/14  . PORT A CATH REVISION  2001   in and out  . RIGHT COLECTOMY  2001   cecum cancer      Family History  Problem Relation Age of Onset  . Arthritis/Rheumatoid Mother   . Allergic rhinitis Father   . Drug abuse Son   . Angioedema Neg Hx   . Asthma Neg Hx   . Atopy Neg Hx   . Eczema Neg Hx   . Immunodeficiency Neg Hx   . Urticaria Neg Hx       Social History   Socioeconomic History  . Marital status: Married    Spouse name: Not on  file  . Number of children: Not on file  . Years of education: Not on file  . Highest education level: Not on file  Occupational History  . Not on file  Social Needs  . Financial resource strain: Not on file  . Food insecurity:    Worry: Not on file    Inability: Not on file  . Transportation needs:    Medical: Not on file    Non-medical: Not on file  Tobacco Use  . Smoking status: Former Smoker    Packs/day: 1.00    Years: 5.00    Pack years: 5.00    Types: Cigarettes    Last attempt to quit: 02/05/1998    Years since quitting: 19.2  . Smokeless tobacco: Never Used  Substance and Sexual Activity  . Alcohol use: Yes    Comment: occ  . Drug use: No  . Sexual activity: Not on file  Lifestyle  . Physical activity:    Days per week: Not on file    Minutes per session: Not on file  . Stress: Not on  file  Relationships  . Social connections:    Talks on phone: Not on file    Gets together: Not on file    Attends religious service: Not on file    Active member of club or organization: Not on file    Attends meetings of clubs or organizations: Not on file    Relationship status: Not on file  . Intimate partner violence:    Fear of current or ex partner: Not on file    Emotionally abused: Not on file    Physically abused: Not on file    Forced sexual activity: Not on file  Other Topics Concern  . Not on file  Social History Narrative  . Not on file      Review of Systems  Constitutional: Negative for chills and fever.  Respiratory: Negative for chest tightness, shortness of breath and wheezing.   Cardiovascular: Negative for chest pain.  Skin: Positive for rash.       Objective:    BP (!) 147/87 (BP Location: Right Arm, Patient Position: Sitting, Cuff Size: Large)   Pulse 71   Temp 97.6 F (36.4 C) (Oral)   Ht 5\' 2"  (1.575 m)   Wt 239 lb (108.4 kg)   LMP 05/01/2017 (Approximate)   BMI 43.71 kg/m  Nursing note and vital signs reviewed.  Physical Exam  Constitutional: She is oriented to person, place, and time. She appears well-developed and well-nourished. No distress.  Cardiovascular: Normal rate, regular rhythm, normal heart sounds and intact distal pulses.  Pulmonary/Chest: Effort normal and breath sounds normal.  Neurological: She is alert and oriented to person, place, and time.  Skin: Skin is warm and dry.  Psychiatric: She has a normal mood and affect. Dana Hicks behavior is normal. Judgment and thought content normal.        Assessment & Plan:   Problem List Items Addressed This Visit      Other   MRSA colonization - Primary    Appears she has colonization with MRSA with no active infection currently. There is no indication for antibiotics at this time. Recommend decolonization with mupiricin for the nose and chlorhexadine 4% washes for 7 days.  Information also provided about diluted bleach baths. Follow up if symptoms worsen or do not improve.       Relevant Medications   mupirocin ointment (BACTROBAN) 2 %  I am having Dana Hicks start on mupirocin ointment. I am also having Dana Hicks maintain Dana Hicks Fish Oil, cetirizine, folic acid, fluticasone, azelastine, ORENCIA CLICKJECT, diclofenac sodium, Methotrexate (PF), and predniSONE.   Meds ordered this encounter  Medications  . mupirocin ointment (BACTROBAN) 2 %    Sig: Place 1 application into the nose 2 (two) times daily.    Dispense:  22 g    Refill:  0    Order Specific Question:   Supervising Provider    Answer:   Carlyle Basques [[id number]]     Follow-up: Return if symptoms worsen or fail to improve.  Mauricio Po, [facility]

## 2017-05-12 NOTE — Patient Instructions (Signed)
Nice to meet you.   Topical body decolonization (one of the following):  .Chlorhexidine gluconate (2% or 4% solution): daily washes or use of a disposable impregnated cloth for 5 to 14 days, or  .Dilute bleach baths (one teaspoon bleach per gallon of water, or one-fourth cup bleach per one-fourth tub [approximately 13 gallons of water] for 15 minutes twice weekly) for approximately three months  We have also sent in a prescription for mupiricin for your nose.   Nasal decolonization with mupirocin ointment (2%) applied to nares twice daily for 5 to 10 days

## 2017-05-16 DIAGNOSIS — S29012A Strain of muscle and tendon of back wall of thorax, initial encounter: Secondary | ICD-10-CM | POA: Diagnosis not present

## 2017-05-17 ENCOUNTER — Telehealth: Payer: Self-pay | Admitting: *Deleted

## 2017-05-17 ENCOUNTER — Ambulatory Visit (INDEPENDENT_AMBULATORY_CARE_PROVIDER_SITE_OTHER): Payer: BLUE CROSS/BLUE SHIELD | Admitting: *Deleted

## 2017-05-17 DIAGNOSIS — J309 Allergic rhinitis, unspecified: Secondary | ICD-10-CM | POA: Diagnosis not present

## 2017-05-17 NOTE — Telephone Encounter (Signed)
Patient is having lots of eye drainage in her left eye. She had surgery in December to open her tear duct and the drainage did not stop. She has tried lots of eye drops and steroid eye drops and nothing seems to be helping. She is wondering if her allergy injections could be causing her eye drain? Please advise.

## 2017-05-17 NOTE — Telephone Encounter (Signed)
If none of her eye drops are working (including the allergy eye drops) and the steroid eye drops, I doubt that allergy shots are related to this eye issue. I would probably recommend that she talk to her ophthalmologist.   She could try increasing her antihistamines to see if this would help with the symptoms. That could answer the question.  Salvatore Marvel, MD Allergy and [place] [facility] [place]

## 2017-05-18 NOTE — Telephone Encounter (Signed)
Pt informed, will increase her Allegra to BID and if that doesn't help she will go see an Opthamologist.

## 2017-05-19 ENCOUNTER — Telehealth: Payer: Self-pay

## 2017-05-19 NOTE — Telephone Encounter (Signed)
Received a prior authorization request for Rasuvo from [facility]. Authorization has been submitted to pts insurance via cover my meds. Will update once we have a response.   Myles Mallicoat, [place], CPhT 9:14 AM

## 2017-05-23 ENCOUNTER — Telehealth: Payer: Self-pay | Admitting: Rheumatology

## 2017-05-23 NOTE — Telephone Encounter (Signed)
Returned call. Spoke with representative who states that pts rx will go on hold if there is no authorization on file within 24 hours. Authorization was submitted to insurance on 05/19/17 via cover my meds.   Called [employer] to check the status of authorization. Spoke with rep who states that the Josem Kaufmann is still being processed. They have a max of 11 days to response. Will update once we have a response.   Reference number: [id number] Phone: [phone]  Called pt to update. Left message.   Hassell Patras, [place], CPhT 4:33 PM

## 2017-05-23 NOTE — Telephone Encounter (Signed)
Christine from [place] called stating they need approval within 48 hours for the Rasuvo.  Please call [phone] opt 3   Please reference #[id number]

## 2017-05-24 ENCOUNTER — Ambulatory Visit (INDEPENDENT_AMBULATORY_CARE_PROVIDER_SITE_OTHER): Payer: BLUE CROSS/BLUE SHIELD | Admitting: *Deleted

## 2017-05-24 DIAGNOSIS — J309 Allergic rhinitis, unspecified: Secondary | ICD-10-CM

## 2017-05-26 ENCOUNTER — Telehealth: Payer: Self-pay

## 2017-05-26 NOTE — Telephone Encounter (Signed)
Called pt per Marya Amsler, NP pt stated that she had been using the chlorhexidine wash and the bactroban since her last visit on 05/12/17. Pt stated that she feels like she is not improving and is breaking out more. Would like to know how long it could take before she is " clear".  Per Marya Amsler I stated to the pt to try doing a bleach bath to see if that helps any. Gave the pt the instructions on how to get the bleach bath ready and how long she should try it.  Pt stated she would give this a try, but would still like to know how long it would take for her skin to clear up with the chlorhexidine wash and bactroban ointment.  [place]

## 2017-05-26 NOTE — Telephone Encounter (Signed)
Pt called today with questions regarding MRSSA treatment. Pt stated that she was in clinic a couple of weeks ago and meet our NP Terri Piedra who prescribed bactroban ointment for the pt to use. Pt stated that she has developed a new breakout of bumps on her body. She stated she now has two on her inner thigh and three under her breast which she states is new to her. Pt would like a call back on what she should do. She would like to know if she should continue the Bactroban or if Marya Amsler would like to discuss other treatment options.  Stated to the pt that I would follow up with Marya Amsler and call her with his decision. [place]

## 2017-05-26 NOTE — Telephone Encounter (Signed)
Please find out if she has completed the chlorhexidine washes with the Bactroban nasally. She can do this up to about 14 days. She may also try the bleach baths as below. If these are new bumps and different from previous they may/may not be MRSA related. She may also wish to have people in the house perform the decolonization as well.   Dilute bleach baths (one teaspoon bleach per gallon of water, or one-fourth cup bleach per one-fourth tub [approximately 13 gallons of water] for 15 minutes twice weekly) for approximately three months

## 2017-05-27 ENCOUNTER — Telehealth: Payer: Self-pay | Admitting: Rheumatology

## 2017-05-27 DIAGNOSIS — H1013 Acute atopic conjunctivitis, bilateral: Secondary | ICD-10-CM | POA: Diagnosis not present

## 2017-05-27 DIAGNOSIS — H04553 Acquired stenosis of bilateral nasolacrimal duct: Secondary | ICD-10-CM | POA: Diagnosis not present

## 2017-05-27 MED ORDER — PREDNISONE 10 MG PO TABS: 10.0000 mg | ORAL_TABLET | Freq: Every day | ORAL | 0 refills | Status: DC

## 2017-05-27 NOTE — Telephone Encounter (Signed)
Prescription sent to the pharmacy. Patient has been notified that rx has been sent and that she needs a clearance from infectious disease.

## 2017-05-27 NOTE — Telephone Encounter (Signed)
Patient was seen at the eye doctor today and requested the results be sent to our office because she has been told she has inflammation in her tear duct. Patient has had MRSA on her body and has been using hepacleanse and ointment in her nose. The MRSA is still flaring and is not clearing up. She has been seeing infectious disease and has placed a call to them about the next step. Due to the MRSA she is unable to take Orencia and MTX. Patient was placed on prednisone by her orthopedic due to a pulled muscle and patient states her eye quit watering and has been fine since being on the higher dose of prednisone.  Patient is requesting a refill of prednisone 10mg  daily because she is unable to take Orencia and MTX right now. Patient states if she decreases prednisone below 10mg  daily, she begins to hurt.

## 2017-05-27 NOTE — Telephone Encounter (Signed)
Patient called back to advise that she has done everything Marya Amsler told her to do and she is still having

## 2017-05-27 NOTE — Telephone Encounter (Signed)
Ok to give Prednisone 10 mg po qd . She will need Clearance from her infectious disease doctor to restart methotrexate and Orencia

## 2017-05-27 NOTE — Telephone Encounter (Signed)
Patient left a voicemail requesting a prescription refill of Prednisone.  Patient also stated that she wanted a return call "so she could update Dr. Estanislado Pandy on what's been going on."

## 2017-05-30 NOTE — Telephone Encounter (Signed)
Received a fax from [facility] regarding a prior authorization approval for RASUVO 20MG /0.4ML from 05/29/2017 to 05/29/2018.   Reference number: [id number] Phone [place] to update. Spoke with Apolonio Schneiders who states that they got a paid claim as of today. The Rx must sign off on it. The automated system should call pt to schedule shipment today.  Called pt to update. Pt states that she currently has an infection and will not start it again until it is clear. She voices understanding and denies any questions at this time.  Will send document to scan center.  Called pt to update.   Neeka Urista, [place], CPhT 9:21 AM

## 2017-05-31 ENCOUNTER — Ambulatory Visit (INDEPENDENT_AMBULATORY_CARE_PROVIDER_SITE_OTHER): Payer: BLUE CROSS/BLUE SHIELD | Admitting: *Deleted

## 2017-05-31 DIAGNOSIS — J309 Allergic rhinitis, unspecified: Secondary | ICD-10-CM

## 2017-06-02 ENCOUNTER — Other Ambulatory Visit: Payer: Self-pay | Admitting: Family

## 2017-06-02 ENCOUNTER — Telehealth: Payer: Self-pay | Admitting: *Deleted

## 2017-06-02 MED ORDER — DOXYCYCLINE HYCLATE 100 MG PO TABS: 100.0000 mg | ORAL_TABLET | Freq: Two times a day (BID) | ORAL | 0 refills | Status: DC

## 2017-06-02 NOTE — Telephone Encounter (Signed)
Spoke with patient regarding her concerns for continued skin infections as she now has additional symptoms under her breasts. Describes the lesions as pimple like and appearing for only about 1 day. Discussed the MRSA decontamination and will start doxycycline for 5 days. Recommend following up with PCP or dermatology as these do not sound like MRSA infection. Follow up as needed.

## 2017-06-02 NOTE — Telephone Encounter (Signed)
Would you like me to cancel her Tuesday 4/16 appointment with you?

## 2017-06-02 NOTE — Telephone Encounter (Signed)
Yes please - and thank you!

## 2017-06-02 NOTE — Telephone Encounter (Signed)
Sure thing!

## 2017-06-02 NOTE — Telephone Encounter (Signed)
Patient called back, states she hasn't heard back yet from [place] regarding advice and answers to her specific questions.  She feels she needs to know an approximate timeline for treatment, when she can expect to be decolonized, and when she can restart her RA meds.  She is very upset that she has called multiple times and has not gotten an answer yet.  She has been off rheumatoid arthritis medication since December, her MRSA outbreak is not gone and has in fact spread despite decolonization - she is experiencing small pimples under her breasts. She wants to know if there is something else she can do - is there an antibiotic to heal her lesions, should everyone else in her house decolonize at the same time?  She will come in Tuesday, but would like a call back first from [place]. Please call patient at [phone] Landis Gandy, RN

## 2017-06-04 ENCOUNTER — Other Ambulatory Visit: Payer: Self-pay | Admitting: Rheumatology

## 2017-06-06 NOTE — Telephone Encounter (Signed)
Patient states she does not need refill at this time.

## 2017-06-07 ENCOUNTER — Ambulatory Visit (INDEPENDENT_AMBULATORY_CARE_PROVIDER_SITE_OTHER): Payer: BLUE CROSS/BLUE SHIELD | Admitting: *Deleted

## 2017-06-07 ENCOUNTER — Ambulatory Visit: Payer: BLUE CROSS/BLUE SHIELD | Admitting: Family

## 2017-06-07 DIAGNOSIS — J309 Allergic rhinitis, unspecified: Secondary | ICD-10-CM | POA: Diagnosis not present

## 2017-06-14 ENCOUNTER — Ambulatory Visit (INDEPENDENT_AMBULATORY_CARE_PROVIDER_SITE_OTHER): Payer: BLUE CROSS/BLUE SHIELD | Admitting: *Deleted

## 2017-06-14 DIAGNOSIS — J309 Allergic rhinitis, unspecified: Secondary | ICD-10-CM

## 2017-06-21 ENCOUNTER — Encounter: Payer: Self-pay | Admitting: Infectious Diseases

## 2017-06-21 ENCOUNTER — Ambulatory Visit: Payer: BLUE CROSS/BLUE SHIELD | Admitting: Infectious Diseases

## 2017-06-21 ENCOUNTER — Ambulatory Visit (INDEPENDENT_AMBULATORY_CARE_PROVIDER_SITE_OTHER): Payer: BLUE CROSS/BLUE SHIELD | Admitting: *Deleted

## 2017-06-21 DIAGNOSIS — J309 Allergic rhinitis, unspecified: Secondary | ICD-10-CM | POA: Diagnosis not present

## 2017-06-21 DIAGNOSIS — Z22322 Carrier or suspected carrier of Methicillin resistant Staphylococcus aureus: Secondary | ICD-10-CM | POA: Diagnosis not present

## 2017-06-21 DIAGNOSIS — L0102 Bockhart's impetigo: Secondary | ICD-10-CM

## 2017-06-21 MED ORDER — CLINDAMYCIN PHOSPHATE 1 % EX GEL: Freq: Two times a day (BID) | CUTANEOUS | 0 refills | Status: DC

## 2017-06-21 NOTE — Patient Instructions (Signed)
In addition to What Marya Amsler gave you:   Would keep nails clean and trimmed - use the mupirocin twice a day applied with a q-tip under each finger nail.   No sharing towels, deodorants, cosmetics, brushes or razors   Ditch your loofa - single use bath towels/wash cloths.   Change sheets on the bed regularly (once a week)   Change underwear at work if too sweaty. No tight fitting pants/clothes if possible.   Will try clinda-gel for you - apply 2x a day to the areas on your thighs and breasts if these break outs look similar.   Call me with an update in a week or 2 - can try an alternative oral medication that you are not allergic to if needed.

## 2017-06-21 NOTE — Progress Notes (Signed)
Patient: Dana Hicks  DOB: 04/15/1975 MRN: [id number] PCP: [name], MD  Referring Provider: Dr. Estanislado Pandy (Rheumatologist) PCP: Dr. Darcus Austin   Chief Complaint  Patient presents with  . Follow-up    MRSA folliculiits      Patient Active Problem List   Diagnosis Date Noted  . Pustular folliculitis 81/15/7262  . MRSA colonization 05/12/2017  . Left foot pain 01/10/2017  . Osteoarthritis of both knees 12/01/2016  . Class 3 severe obesity due to excess calories without serious comorbidity with body mass index (BMI) of 40.0 to 44.9 in adult ([place]) 10/01/2016  . High risk medication use 02/06/2016  . Family history of rheumatoid arthritis 02/06/2016  . H/O seasonal allergies 02/06/2016  . Rheumatoid arthritis of multiple sites with negative rheumatoid factor ([place]) 02/03/2016  . History of colon cancer 02/03/2016  . HLA B27 (HLA B27 positive) 02/03/2016     Subjective:  Dana Hicks is a 42 y.o. 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 She has a past medical history of rheumatoid arthritis previously on Orencia (last dosed in December) now maintained on Methotrexate weekly and Prednisone 5-10 mg daily. She has seen Marya Amsler in our clinic and attempted decolonization protocol. She feels that the bleach bath helped the most. Still doing Hibeclens scrubs daily. She uses a loufa for cleaning in the shower, re-uses towels and changes sheets about once a month. She also has found that she is more sweaty on the prednisone and frequency has damp underwear from this and moisture under her bra line. Concerned she is having some break outs under her breasts now that are MRSA. She is ready to get back on her Orencia for RA to manage her symptoms but her rheumatology team would like this to clear up. Presently she has one lesion on her left inner thigh and some bumps under her left breast.   Review of  Systems  Constitutional: Negative for chills, fever, malaise/fatigue and weight loss.  HENT: Negative for sore throat.        No dental problems  Respiratory: Negative for cough and sputum production.   Cardiovascular: Negative for chest pain and leg swelling.  Gastrointestinal: Negative for abdominal pain, diarrhea and vomiting.  Genitourinary: Negative for dysuria and flank pain.  Musculoskeletal: Negative for joint pain, myalgias and neck pain.  Skin: Positive for rash.  Neurological: Negative for dizziness, tingling and headaches.  Psychiatric/Behavioral: Negative for depression and substance abuse. The patient is not nervous/anxious and does not have insomnia.     Past Medical History:  Diagnosis Date  . 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 Rheumatoid arthritis ([place])   . Seasonal allergies     Outpatient Medications Prior to Visit  Medication Sig Dispense Refill  . azelastine (ASTELIN) 0.1 % nasal spray     . diclofenac sodium (VOLTAREN) 1 % GEL APPLY 3 GM TO AFFECTED 3 LARGE JOINTS UP TO 3 TIMES A DAY AS NEEDED AS DIRECTED 300 g 1  . predniSONE (DELTASONE) 10 MG tablet Take 1 tablet (10 mg total) by mouth daily with breakfast. 30 tablet 0  . predniSONE (DELTASONE) 5 MG tablet Take 2 tabs po x 1 week, 1.5 tabs x 1 week, 1 tab x 1 week, 0.5 tab x 1 week 35 tablet 0  . cetirizine (ZYRTEC) 10 MG tablet Take 10 mg by mouth daily.    Marland Kitchen doxycycline (VIBRA-TABS) 100 MG tablet Take 1  tablet (100 mg total) by mouth 2 (two) times daily. (Patient not taking: Reported on 06/21/2017) 10 tablet 0  . fluticasone (FLONASE) 50 MCG/ACT nasal spray Flonase 50 mcg/actuation nasal spray,suspension  Inhale 2 sprays every day by intranasal route for 30 days.    . folic acid (FOLVITE) 1 MG tablet Take 2 mg by mouth daily.     . Methotrexate, PF, 20 MG/0.4ML SOAJ Inject 1 pen into the skin once a week. (Patient not taking: Reported on 05/12/2017) 12 pen 0  . mupirocin ointment (BACTROBAN) 2 % Place 1  application into the nose 2 (two) times daily. 22 g 0  . Omega-3 Fatty Acids (FISH OIL) 1000 MG CAPS Take by mouth.    [name] CLICKJECT 161 MG/ML SOAJ INJECT 125 MG INTO THE SKIN ONCE A WEEK (Patient not taking: Reported on 05/12/2017) 4 Syringe 2   No facility-administered medications prior to visit.      Allergies  Allergen Reactions  . Codeine Other (See Comments)    headaches  . Penicillins     Severe headaches  . Bactrim [Sulfamethoxazole-Trimethoprim] Rash    Social History   Tobacco Use  . Smoking status: Former Smoker    Packs/day: 1.00    Years: 5.00    Pack years: 5.00    Types: Cigarettes    Last attempt to quit: 02/05/1998    Years since quitting: 19.3  . Smokeless tobacco: Never Used  Substance Use Topics  . Alcohol use: Yes    Comment: occ  . Drug use: No    Family History  Problem Relation Age of Onset  . Arthritis/Rheumatoid Mother   . Allergic rhinitis Father   . Drug abuse Son   . Angioedema Neg Hx   . Asthma Neg Hx   . Atopy Neg Hx   . Eczema Neg Hx   . Immunodeficiency Neg Hx   . Urticaria Neg Hx     Objective:   Vitals:   06/21/17 1643  BP: 128/86  Pulse: 91  Temp: 98.5 F (36.9 C)  TempSrc: Oral  Weight: 110.2 kg (243 lb)  Height: 5' 2" (1.575 m)   Body mass index is 44.45 kg/m.  Physical Exam  Constitutional: She is oriented to person, place, and time. She appears well-developed and well-nourished.  Seated comfortably in chair.   HENT:  Mouth/Throat: Mucous membranes are normal. No oral lesions. Normal dentition. No dental abscesses. No oropharyngeal exudate.  Cardiovascular: Normal rate and regular rhythm.  Pulmonary/Chest: Effort normal and breath sounds normal.  Abdominal: Soft. She exhibits no distension. There is no tenderness.  Neurological: She is alert and oriented to person, place, and time.  Skin: Skin is warm and dry. No rash noted.  Left inframammary fold - no erythema, drainage or rash present. No pustules  present.  Left inner thigh - solitary pustule present - 2 mm in diameter. No induration, swelling or drainage noted. No palpable sac. There are a few scattered areas above closer to the left groin with scarring without evidence of deep infection.   Psychiatric: She has a normal mood and affect. Judgment normal.  In good spirits today and engaged in care discussion  Vitals reviewed.   Lab Results:    Assessment & Plan:   Problem List Items Addressed This Visit      Musculoskeletal and Integument   Pustular folliculitis    Inframmary fold without evidence of pustular involvement. Also no signs of yeast/fungus.  One isolated pustule to left inner/upper  thigh. She asked me to lance this today however it is too small to collect any sample and high likelihood it will pick up skin organism that may not be contributing. I will have her use some clindamycin gel to the area twice a day to see if this will help. I revewed her isolate from December and MRSA was at that time sensitive to clindamycin as well as tetracycline.   This at least now seems to be relatively mild process; barring it continues to improve or remain stable I would think that she could trial re-introduction of Orencia for her RA. She will likely always be at higher risk for skin and other infections and likely will need to consider the possibility the [name] puts her at too high a risk to where it outweighs the benefit of the RA control.   For now she will try to implement the lifestyle/environmental factors we discussed and use the clinda gel to treat the area. If this gets worse or does not improve will try adding linezolid 600 mg BID x 5 days.         Other   MRSA colonization    Advised more specific focus for hygiene practices to lessen the likelihood of repeat infection -- ie: no further use of loufa, single use of towels/wash cloths prior to laundering, more frequent changing of bed sheets, changing underwear during the  day if they are moist with sweat, loose fitting clothing, etc. Advised to try the 3x weekly bleach baths vs hibeclens since she remarked this seemed to help more (although not as pleasant). I asked her to consider using the mupirocin under fingernails as well (applied with q-tip) twice a day while she tries to clear her infection.   We talked about the need to possibly incorporate decolonization protocol every 3-6 months considering the likelihood of re-colonization is high.          Janene Madeira, MSN, NP-C [facility] for [facility] [place] Pager: [phone] Office: [phone]  06/24/17  3:16 PM

## 2017-06-22 DIAGNOSIS — L0102 Bockhart's impetigo: Secondary | ICD-10-CM | POA: Insufficient documentation

## 2017-06-24 NOTE — Assessment & Plan Note (Addendum)
Inframmary fold without evidence of pustular involvement. Also no signs of yeast/fungus.  One isolated pustule to left inner/upper thigh. She asked me to lance this today however it is too small to collect any sample and high likelihood it will pick up skin organism that may not be contributing. I will have her use some clindamycin gel to the area twice a day to see if this will help. I revewed her isolate from December and MRSA was at that time sensitive to clindamycin as well as tetracycline.   This at least now seems to be relatively mild process; barring it continues to improve or remain stable I would think that she could trial re-introduction of Orencia for her RA. She will likely always be at higher risk for skin and other infections and likely will need to consider the possibility the [name] puts her at too high a risk to where it outweighs the benefit of the RA control.   For now she will try to implement the lifestyle/environmental factors we discussed and use the clinda gel to treat the area. If this gets worse or does not improve will try adding linezolid 600 mg BID x 5 days.

## 2017-06-24 NOTE — Assessment & Plan Note (Addendum)
Advised more specific focus for hygiene practices to lessen the likelihood of repeat infection -- ie: no further use of loufa, single use of towels/wash cloths prior to laundering, more frequent changing of bed sheets, changing underwear during the day if they are moist with sweat, loose fitting clothing, etc. Advised to try the 3x weekly bleach baths vs hibeclens since she remarked this seemed to help more (although not as pleasant). I asked her to consider using the mupirocin under fingernails as well (applied with q-tip) twice a day while she tries to clear her infection.   We talked about the need to possibly incorporate decolonization protocol every 3-6 months considering the likelihood of re-colonization is high.

## 2017-06-28 ENCOUNTER — Ambulatory Visit (INDEPENDENT_AMBULATORY_CARE_PROVIDER_SITE_OTHER): Payer: BLUE CROSS/BLUE SHIELD | Admitting: *Deleted

## 2017-06-28 DIAGNOSIS — J309 Allergic rhinitis, unspecified: Secondary | ICD-10-CM

## 2017-06-29 ENCOUNTER — Telehealth: Payer: Self-pay | Admitting: Rheumatology

## 2017-06-29 MED ORDER — PREDNISONE 10 MG PO TABS: 10.0000 mg | ORAL_TABLET | Freq: Every day | ORAL | 0 refills | Status: DC

## 2017-06-29 NOTE — Telephone Encounter (Signed)
Okay to refill prednisone taper as prescribed earlier

## 2017-06-29 NOTE — Telephone Encounter (Signed)
Patient called requesting prescription refill of Prednisone.  Patient states her Herold Harms is finally starting to get better.  Patient's pharmacy is [facility] [address] in [place].

## 2017-07-05 ENCOUNTER — Ambulatory Visit (INDEPENDENT_AMBULATORY_CARE_PROVIDER_SITE_OTHER): Payer: BLUE CROSS/BLUE SHIELD | Admitting: *Deleted

## 2017-07-05 ENCOUNTER — Ambulatory Visit: Payer: BLUE CROSS/BLUE SHIELD | Admitting: Internal Medicine

## 2017-07-05 DIAGNOSIS — J309 Allergic rhinitis, unspecified: Secondary | ICD-10-CM | POA: Diagnosis not present

## 2017-07-11 ENCOUNTER — Telehealth: Payer: Self-pay

## 2017-07-11 NOTE — Telephone Encounter (Signed)
Pt called asking for a refill on Clindamycin stated during her call that the medication is helping with her "spots". Stated that she is almost done with the medication but needs a refill sent in to the pharmacy for her.  [place]

## 2017-07-12 ENCOUNTER — Ambulatory Visit (INDEPENDENT_AMBULATORY_CARE_PROVIDER_SITE_OTHER): Payer: BLUE CROSS/BLUE SHIELD | Admitting: *Deleted

## 2017-07-12 DIAGNOSIS — J309 Allergic rhinitis, unspecified: Secondary | ICD-10-CM | POA: Diagnosis not present

## 2017-07-13 DIAGNOSIS — J301 Allergic rhinitis due to pollen: Secondary | ICD-10-CM | POA: Diagnosis not present

## 2017-07-13 NOTE — Progress Notes (Signed)
VIALS EXP 07-14-18 

## 2017-07-13 NOTE — Progress Notes (Signed)
Office Visit Note  Patient: Dana Hicks             Date of Birth: Jun 09, 1975           MRN: [id number]             PCP: [name], MD Referring: [name], MD Visit Date: 07/27/2017 Occupation: _0 @    Subjective:  Pain in multiple joints   History of Present Illness: Dana Hicks is a 42 y.o. female with history of seronegative rheumatoid arthritis and osteoarthritis.  Patient restarted on methotrexate 3 weeks ago.  She is injecting 20 mg once weekly and taking folic acid 2 mg daily.  She needs a refill folic acid today.  She reports that she continues to have inflammation in her left eye and has been using prednisone drops during the day and another eyedrop at night.  She is following up with her ophthalmologist as needed.  She states that when her joint joints seem to be more inflamed her eye becomes more inflamed as well.  She states that she was previously on Humira in the past which stopped helping her rheumatoid arthritis.  She states that she continues to take prednisone 10 mg by mouth daily.  She reports she continues to flare.   She states she is having pain in multiple joints including bilateral shoulders, left knee, and right foot. She has warmth and swelling of the left knee. She denies any rheumatoid nodules.  She states her hands, wrists, and elbows are doing well.  She states she uses clindamycin topical gel PRN if she has folliculitis. She denies any recent MRSA infections. She reports her previously infection cleared after taking clindamycin.    Activities of Daily Living:  Patient reports morning stiffness for 15 minutes.   Patient Reports nocturnal pain.  Difficulty dressing/grooming: Denies Difficulty climbing stairs: Reports Difficulty getting out of chair: Denies Difficulty using hands for taps, buttons, cutlery, and/or writing: Reports   Review of Systems  Constitutional: Positive for fatigue.  HENT: Negative for mouth sores, mouth dryness  and nose dryness.   Eyes: Positive for pain. Negative for visual disturbance and dryness.  Respiratory: Negative for cough, hemoptysis, shortness of breath and difficulty breathing.   Cardiovascular: Negative for chest pain, palpitations, hypertension and swelling in legs/feet.  Gastrointestinal: Negative for abdominal pain, blood in stool, constipation and diarrhea.  Endocrine: Negative for increased urination.  Genitourinary: Negative for painful urination and pelvic pain.  Musculoskeletal: Positive for arthralgias, joint pain, joint swelling and morning stiffness. Negative for myalgias, muscle weakness, muscle tenderness and myalgias.  Skin: Negative for color change, pallor, rash, hair loss, nodules/bumps, skin tightness, ulcers and sensitivity to sunlight.  Allergic/Immunologic: Negative for susceptible to infections.  Neurological: Positive for memory loss and weakness. Negative for dizziness, light-headedness, numbness and headaches.  Hematological: Positive for bruising/bleeding tendency. Negative for swollen glands.  Psychiatric/Behavioral: Negative for depressed mood, confusion and sleep disturbance. The patient is not nervous/anxious.     PMFS History:  Patient Active Problem List   Diagnosis Date Noted  . Pustular folliculitis 85/63/1497  . MRSA colonization 05/12/2017  . Left foot pain 01/10/2017  . Osteoarthritis of both knees 12/01/2016  . Class 3 severe obesity due to excess calories without serious comorbidity with body mass index (BMI) of 40.0 to 44.9 in adult ([place]) 10/01/2016  . High risk medication use 02/06/2016  . Family history of rheumatoid arthritis 02/06/2016  . H/O seasonal allergies 02/06/2016  . Rheumatoid arthritis of  multiple sites with negative rheumatoid factor ([place]) 02/03/2016  . History of colon cancer 02/03/2016  . HLA B27 (HLA B27 positive) 02/03/2016    Past Medical History:  Diagnosis Date  . Cancer ([place]) 2001   cecum-chemo  . Rheumatoid  arthritis ([place])   . Seasonal allergies     Family History  Problem Relation Age of Onset  . Arthritis/Rheumatoid Mother   . Allergic rhinitis Father   . Drug abuse Son   . Angioedema Neg Hx   . Asthma Neg Hx   . Atopy Neg Hx   . Eczema Neg Hx   . Immunodeficiency Neg Hx   . Urticaria Neg Hx    Past Surgical History:  Procedure Laterality Date  . BREAST LUMPECTOMY WITH RADIOACTIVE SEED LOCALIZATION Left 06/28/2014   Procedure: RADIOACTIVE SEED LOCALIZATION LEFT BREAST LUMPECTOMY;  Surgeon: Excell Seltzer, MD;  Location: [facility];  Service: General;  Laterality: Left;  . COLONOSCOPY  06/19/14  . PORT A CATH REVISION  2001   in and out  . RIGHT COLECTOMY  2001   cecum cancer   Social History   Social History Narrative  . Not on file     Objective: Vital Signs: BP 132/78 (BP Location: Left Arm, Patient Position: Sitting, Cuff Size: Large)   Pulse 83   Resp 16   Ht 5' 2" (1.575 m)   Wt 248 lb (112.5 kg)   BMI 45.36 kg/m    Physical Exam  Constitutional: She is oriented to person, place, and time. She appears well-developed and well-nourished.  HENT:  Head: Normocephalic and atraumatic.  Eyes: Conjunctivae and EOM are normal.  Neck: Normal range of motion.  Cardiovascular: Normal rate, regular rhythm, normal heart sounds and intact distal pulses.  Pulmonary/Chest: Effort normal and breath sounds normal.  Abdominal: Soft. Bowel sounds are normal.  Lymphadenopathy:    She has no cervical adenopathy.  Neurological: She is alert and oriented to person, place, and time.  Skin: Skin is warm and dry. Capillary refill takes less than 2 seconds.  Psychiatric: She has a normal mood and affect. Her behavior is normal.  Nursing note and vitals reviewed.    Musculoskeletal Exam: C-spine, thoracic spine, lumbar spine good range of motion.  No midline spinal tenderness.  No SI joint tenderness.  Shoulder joints, elbow joints, wrist joints, MCPs, PIPs, DIPs good  range of motion no synovitis. She has right shoulder tenderness and discomfort with full ROM. Hip joints, knee joints, ankle joints, MTPs, PIPs, DIPs good range of motion no synovitis.  She has pedal edema bilaterally.  She has warmth and small effusion of her left knee.  No tenderness of trochanteric bursa bilaterally.  CDAI Exam: CDAI Homunculus Exam:   Joint Counts:  CDAI Tender Joint count: 0 CDAI Swollen Joint count: 0  Global Assessments:  Patient Global Assessment: 5 Provider Global Assessment: 5  CDAI Calculated Score: 10    Investigation: No additional findings.  CBC Latest Ref Rng & Units 04/27/2017 04/27/2017 12/01/2016  WBC - CANCELED 12.0(H) 7.1  Hemoglobin 11.7 - 15.5 g/dL - 12.7 12.2  Hematocrit 35.0 - 45.0 % - 38.1 36.1  Platelets 140 - 400 Thousand/uL - 283 263   CMP Latest Ref Rng & Units 04/27/2017 12/01/2016 10/01/2016  Glucose 65 - 99 mg/dL 80 86 88  BUN 7 - 25 mg/dL _0 Creatinine 0.50 - 1.10 mg/dL 0.75 0.68 0.70  Sodium 135 - 146 mmol/L 139 139 138  Potassium 3.5 -  5.3 mmol/L 4.1 4.0 4.2  Chloride 98 - 110 mmol/L 102 104 102  CO2 20 - 32 mmol/L _0 Calcium 8.6 - 10.2 mg/dL 9.1 8.8 9.1  Total Protein 6.1 - 8.1 g/dL 6.5 6.1 6.4  Total Bilirubin 0.2 - 1.2 mg/dL 0.4 0.3 0.5  Alkaline Phos 33 - 115 U/L - - 45  AST 10 - 30 U/L 9(L) 13 16  ALT 6 - 29 U/L _1 Imaging: No results found.  Speciality Comments: No specialty comments available.    Procedures:  No procedures performed Allergies: Codeine; Penicillins; and Bactrim [sulfamethoxazole-trimethoprim]   Assessment / Plan:     Visit Diagnoses: Rheumatoid arthritis of multiple sites with negative rheumatoid factor ([place]): She has no synovitis on exam.  She has warmth and small effusion of left knee on exam.  She restarted Methotrexate 20 mg/0.4 ml subcutaneous injections 3 weeks ago.  She continues to take Prednisone 10 mg daily.  She has not restarted on [place].  She continues to be  followed by her ophthalmologist for "inflammation of the lacrimal duct" and she is using a prednisone eyedrops during the day and another lubricating eyedrop and at night.  She has scar tissue removed from the lacrimal duct previously.  She is to follow-up with her ophthalmologist on an as needed basis.  The plan is to have her resume the Orencia this week.  Once she is stable on Orencia and methotrexate we will consider tapering the prednisone.  If she continues to have inflammation of her left eye and her rheumatoid arthritis flares we will consider restarting her on Humira.  She will return in 3 months.  HLA B27 (HLA B27 positive)  High risk medication use - Orencia clickjet, MTX, folic acid 2 mg. TB gold 10/01/16. CBC and CMP were drawn today.  She will return in September and  - Plan: CBC with Differential/Platelet, COMPLETE METABOLIC PANEL WITH GFR  Primary osteoarthritis of both knees: She has warmth and a small effusion of her left knee.  She continues to have discomfort in her left knee.  Her right knee is doing well with good range of motion with no discomfort.  She has no warmth or effusion of her right knee.  Other medical conditions are listed as follows:  Family history of rheumatoid arthritis  History of colon cancer  H/O seasonal allergies    Orders: Orders Placed This Encounter  Procedures  . CBC with Differential/Platelet  . COMPLETE METABOLIC PANEL WITH GFR   Meds ordered this encounter  Medications  . folic acid (FOLVITE) 1 MG tablet    Sig: Take 2 tablets (2 mg total) by mouth daily.    Dispense:  180 tablet    Refill:  1    Face-to-face time spent with patient was 30 minutes. >50% of time was spent in counseling and coordination of care.  Follow-Up Instructions: Return in about 3 months (around 10/27/2017) for Rheumatoid arthritis, Osteoarthritis.   Ofilia Neas, PA-C  Note - This record has been created using Dragon software.  Chart creation errors have  been sought, but may not always  have been located. Such creation errors do not reflect on  the standard of medical care.

## 2017-07-19 ENCOUNTER — Ambulatory Visit (INDEPENDENT_AMBULATORY_CARE_PROVIDER_SITE_OTHER): Payer: BLUE CROSS/BLUE SHIELD | Admitting: *Deleted

## 2017-07-19 DIAGNOSIS — J309 Allergic rhinitis, unspecified: Secondary | ICD-10-CM | POA: Diagnosis not present

## 2017-07-20 ENCOUNTER — Telehealth: Payer: Self-pay | Admitting: Behavioral Health

## 2017-07-20 ENCOUNTER — Other Ambulatory Visit: Payer: Self-pay | Admitting: Behavioral Health

## 2017-07-20 DIAGNOSIS — L0102 Bockhart's impetigo: Secondary | ICD-10-CM

## 2017-07-20 MED ORDER — CLINDAMYCIN PHOSPHATE 1 % EX GEL: Freq: Two times a day (BID) | CUTANEOUS | 2 refills | Status: DC

## 2017-07-20 NOTE — Telephone Encounter (Signed)
Patient called this morning stating she was prescribed Clindamycin gel at her last office visit.  She states the medication is helping and needs a refill. Pricilla Riffle RN

## 2017-07-20 NOTE — Telephone Encounter (Signed)
Yes please refill with 2 additional fills. Glad it is helping her. Can you also ask her if she has been able to restart her rheumatoid arthritis medication?

## 2017-07-20 NOTE — Telephone Encounter (Addendum)
Called Chrisitina back to inform her that her Clindagel was refilled and sent to [facility] on [address].  Also left in the message for her to return our call.  Colletta Maryland had a question for her.  Left return call number.   Pricilla Riffle RN

## 2017-07-26 ENCOUNTER — Ambulatory Visit (INDEPENDENT_AMBULATORY_CARE_PROVIDER_SITE_OTHER): Payer: BLUE CROSS/BLUE SHIELD | Admitting: *Deleted

## 2017-07-26 DIAGNOSIS — J309 Allergic rhinitis, unspecified: Secondary | ICD-10-CM | POA: Diagnosis not present

## 2017-07-27 ENCOUNTER — Encounter (INDEPENDENT_AMBULATORY_CARE_PROVIDER_SITE_OTHER): Payer: Self-pay

## 2017-07-27 ENCOUNTER — Encounter: Payer: Self-pay | Admitting: Physician Assistant

## 2017-07-27 ENCOUNTER — Ambulatory Visit: Payer: BLUE CROSS/BLUE SHIELD | Admitting: Physician Assistant

## 2017-07-27 ENCOUNTER — Encounter: Payer: Self-pay | Admitting: Rheumatology

## 2017-07-27 VITALS — BP 132/78 | HR 83 | Resp 16 | Ht 62.0 in | Wt 248.0 lb

## 2017-07-27 DIAGNOSIS — Z79899 Other long term (current) drug therapy: Secondary | ICD-10-CM

## 2017-07-27 DIAGNOSIS — Z85038 Personal history of other malignant neoplasm of large intestine: Secondary | ICD-10-CM | POA: Diagnosis not present

## 2017-07-27 DIAGNOSIS — M0609 Rheumatoid arthritis without rheumatoid factor, multiple sites: Secondary | ICD-10-CM | POA: Diagnosis not present

## 2017-07-27 DIAGNOSIS — Z889 Allergy status to unspecified drugs, medicaments and biological substances status: Secondary | ICD-10-CM

## 2017-07-27 DIAGNOSIS — Z8261 Family history of arthritis: Secondary | ICD-10-CM | POA: Diagnosis not present

## 2017-07-27 DIAGNOSIS — M170 Bilateral primary osteoarthritis of knee: Secondary | ICD-10-CM

## 2017-07-27 DIAGNOSIS — Z1589 Genetic susceptibility to other disease: Secondary | ICD-10-CM | POA: Diagnosis not present

## 2017-07-27 MED ORDER — FOLIC ACID 1 MG PO TABS: 2.0000 mg | ORAL_TABLET | Freq: Every day | ORAL | 1 refills | Status: DC

## 2017-07-28 ENCOUNTER — Ambulatory Visit: Payer: BLUE CROSS/BLUE SHIELD | Admitting: Rheumatology

## 2017-07-28 LAB — COMPLETE METABOLIC PANEL WITH GFR
AG Ratio: 1.9 (calc) (ref 1.0–2.5)
ALT: 22 U/L (ref 6–29)
AST: 15 U/L (ref 10–30)
Albumin: 4.1 g/dL (ref 3.6–5.1)
Alkaline phosphatase (APISO): 51 U/L (ref 33–115)
BUN: 11 mg/dL (ref 7–25)
CO2: 30 mmol/L (ref 20–32)
Calcium: 9 mg/dL (ref 8.6–10.2)
Chloride: 102 mmol/L (ref 98–110)
Creat: 0.68 mg/dL (ref 0.50–1.10)
GFR, Est African American: 123 mL/min/{1.73_m2} (ref >=60)
GFR, Est Non African American: 106 mL/min/{1.73_m2} (ref >=60)
Globulin: 2.2 g/dL (calc) (ref 1.9–3.7)
Glucose, Bld: 81 mg/dL (ref 65–99)
Potassium: 4 mmol/L (ref 3.5–5.3)
Sodium: 140 mmol/L (ref 135–146)
Total Bilirubin: 0.4 mg/dL (ref 0.2–1.2)
Total Protein: 6.3 g/dL (ref 6.1–8.1)

## 2017-07-28 LAB — CBC WITH DIFFERENTIAL/PLATELET
Basophils Absolute: 52 cells/uL (ref 0–200)
Basophils Relative: 0.5 %
Eosinophils Absolute: 104 cells/uL (ref 15–500)
Eosinophils Relative: 1 %
HCT: 37.6 % (ref 35.0–45.0)
Hemoglobin: 12.5 g/dL (ref 11.7–15.5)
Lymphs Abs: 3058 cells/uL (ref 850–3900)
MCH: 29.7 pg (ref 27.0–33.0)
MCHC: 33.2 g/dL (ref 32.0–36.0)
MCV: 89.3 fL (ref 80.0–100.0)
MPV: 11.7 fL (ref 7.5–12.5)
Monocytes Relative: 6.7 %
Neutro Abs: 6490 cells/uL (ref 1500–7800)
Neutrophils Relative %: 62.4 %
Platelets: 266 10*3/uL (ref 140–400)
RBC: 4.21 10*6/uL (ref 3.80–5.10)
RDW: 13.8 % (ref 11.0–15.0)
Total Lymphocyte: 29.4 %
WBC mixed population: 697 cells/uL (ref 200–950)
WBC: 10.4 10*3/uL (ref 3.8–10.8)

## 2017-07-28 NOTE — Progress Notes (Signed)
Labs are WNL.

## 2017-07-29 ENCOUNTER — Encounter: Payer: Self-pay | Admitting: Rheumatology

## 2017-08-01 ENCOUNTER — Encounter: Payer: Self-pay | Admitting: Rheumatology

## 2017-08-01 ENCOUNTER — Other Ambulatory Visit: Payer: Self-pay | Admitting: Rheumatology

## 2017-08-01 MED ORDER — PREDNISONE 10 MG PO TABS: 10.0000 mg | ORAL_TABLET | Freq: Every day | ORAL | 0 refills | Status: DC

## 2017-08-01 NOTE — Telephone Encounter (Signed)
Last Visit: 07/27/17 Next visit: 11/14/17  Okay to refill per Dr. Deveshwar  

## 2017-08-02 ENCOUNTER — Ambulatory Visit (INDEPENDENT_AMBULATORY_CARE_PROVIDER_SITE_OTHER): Payer: BLUE CROSS/BLUE SHIELD | Admitting: *Deleted

## 2017-08-02 DIAGNOSIS — J309 Allergic rhinitis, unspecified: Secondary | ICD-10-CM

## 2017-08-09 ENCOUNTER — Ambulatory Visit (INDEPENDENT_AMBULATORY_CARE_PROVIDER_SITE_OTHER): Payer: BLUE CROSS/BLUE SHIELD | Admitting: *Deleted

## 2017-08-09 DIAGNOSIS — J309 Allergic rhinitis, unspecified: Secondary | ICD-10-CM

## 2017-08-12 DIAGNOSIS — Z85038 Personal history of other malignant neoplasm of large intestine: Secondary | ICD-10-CM | POA: Diagnosis not present

## 2017-08-12 DIAGNOSIS — R1012 Left upper quadrant pain: Secondary | ICD-10-CM | POA: Diagnosis not present

## 2017-08-15 ENCOUNTER — Other Ambulatory Visit (HOSPITAL_COMMUNITY): Payer: Self-pay | Admitting: Physician Assistant

## 2017-08-15 DIAGNOSIS — R509 Fever, unspecified: Secondary | ICD-10-CM

## 2017-08-15 DIAGNOSIS — R1012 Left upper quadrant pain: Secondary | ICD-10-CM

## 2017-08-16 ENCOUNTER — Ambulatory Visit (INDEPENDENT_AMBULATORY_CARE_PROVIDER_SITE_OTHER): Payer: BLUE CROSS/BLUE SHIELD | Admitting: *Deleted

## 2017-08-16 DIAGNOSIS — J309 Allergic rhinitis, unspecified: Secondary | ICD-10-CM | POA: Diagnosis not present

## 2017-08-16 DIAGNOSIS — R1010 Upper abdominal pain, unspecified: Secondary | ICD-10-CM | POA: Diagnosis not present

## 2017-08-16 DIAGNOSIS — M549 Dorsalgia, unspecified: Secondary | ICD-10-CM | POA: Diagnosis not present

## 2017-08-16 DIAGNOSIS — R509 Fever, unspecified: Secondary | ICD-10-CM | POA: Diagnosis not present

## 2017-08-16 DIAGNOSIS — D649 Anemia, unspecified: Secondary | ICD-10-CM | POA: Diagnosis not present

## 2017-08-18 ENCOUNTER — Ambulatory Visit (HOSPITAL_COMMUNITY)
Admission: RE | Admit: 2017-08-18 | Discharge: 2017-08-18 | Disposition: A | Discharge status: Home / Self Care | Payer: BLUE CROSS/BLUE SHIELD | Source: Ambulatory Visit | Attending: Physician Assistant | Admitting: Physician Assistant

## 2017-08-18 DIAGNOSIS — R1012 Left upper quadrant pain: Secondary | ICD-10-CM | POA: Diagnosis not present

## 2017-08-18 DIAGNOSIS — R1903 Right lower quadrant abdominal swelling, mass and lump: Secondary | ICD-10-CM | POA: Diagnosis not present

## 2017-08-18 DIAGNOSIS — R509 Fever, unspecified: Secondary | ICD-10-CM | POA: Diagnosis not present

## 2017-08-18 DIAGNOSIS — R188 Other ascites: Secondary | ICD-10-CM | POA: Insufficient documentation

## 2017-08-18 DIAGNOSIS — N200 Calculus of kidney: Secondary | ICD-10-CM | POA: Diagnosis not present

## 2017-08-18 DIAGNOSIS — R109 Unspecified abdominal pain: Secondary | ICD-10-CM | POA: Diagnosis not present

## 2017-08-18 MED ORDER — IOPAMIDOL (ISOVUE-300) INJECTION 61%
100.0000 mL | Freq: Once | INTRAVENOUS | Status: AC | PRN
  Administered 2017-08-18: 100 mL via INTRAVENOUS

## 2017-08-18 MED ORDER — IOPAMIDOL (ISOVUE-300) INJECTION 61%
INTRAVENOUS | Status: AC
  Filled 2017-08-18: qty 100

## 2017-08-22 DIAGNOSIS — C561 Malignant neoplasm of right ovary: Secondary | ICD-10-CM

## 2017-08-22 DIAGNOSIS — R1909 Other intra-abdominal and pelvic swelling, mass and lump: Secondary | ICD-10-CM | POA: Diagnosis not present

## 2017-08-22 DIAGNOSIS — C541 Malignant neoplasm of endometrium: Secondary | ICD-10-CM

## 2017-08-22 HISTORY — DX: Malignant neoplasm of right ovary: C56.1

## 2017-08-22 HISTORY — DX: Malignant neoplasm of endometrium: C54.1

## 2017-08-23 DIAGNOSIS — N858 Other specified noninflammatory disorders of uterus: Secondary | ICD-10-CM | POA: Diagnosis not present

## 2017-08-23 DIAGNOSIS — R1012 Left upper quadrant pain: Secondary | ICD-10-CM | POA: Diagnosis not present

## 2017-08-23 DIAGNOSIS — Z6841 Body Mass Index (BMI) 40.0 and over, adult: Secondary | ICD-10-CM | POA: Diagnosis not present

## 2017-08-24 ENCOUNTER — Encounter: Payer: Self-pay | Admitting: Obstetrics

## 2017-08-24 ENCOUNTER — Inpatient Hospital Stay: Payer: BLUE CROSS/BLUE SHIELD

## 2017-08-24 ENCOUNTER — Encounter (HOSPITAL_COMMUNITY): Payer: Self-pay | Admitting: *Deleted

## 2017-08-24 ENCOUNTER — Inpatient Hospital Stay: Payer: BLUE CROSS/BLUE SHIELD | Attending: Obstetrics | Admitting: Obstetrics

## 2017-08-24 VITALS — BP 150/87 | HR 105 | Temp 99.0°F | Resp 20 | Ht 62.0 in | Wt 240.4 lb

## 2017-08-24 DIAGNOSIS — N838 Other noninflammatory disorders of ovary, fallopian tube and broad ligament: Secondary | ICD-10-CM

## 2017-08-24 DIAGNOSIS — K8689 Other specified diseases of pancreas: Secondary | ICD-10-CM

## 2017-08-24 DIAGNOSIS — R971 Elevated cancer antigen 125 [CA 125]: Secondary | ICD-10-CM

## 2017-08-24 DIAGNOSIS — K869 Disease of pancreas, unspecified: Secondary | ICD-10-CM

## 2017-08-24 DIAGNOSIS — R933 Abnormal findings on diagnostic imaging of other parts of digestive tract: Secondary | ICD-10-CM | POA: Diagnosis not present

## 2017-08-24 DIAGNOSIS — Z85038 Personal history of other malignant neoplasm of large intestine: Secondary | ICD-10-CM | POA: Diagnosis not present

## 2017-08-24 DIAGNOSIS — N839 Noninflammatory disorder of ovary, fallopian tube and broad ligament, unspecified: Secondary | ICD-10-CM | POA: Diagnosis not present

## 2017-08-24 DIAGNOSIS — D3910 Neoplasm of uncertain behavior of unspecified ovary: Secondary | ICD-10-CM | POA: Diagnosis not present

## 2017-08-24 LAB — COMPREHENSIVE METABOLIC PANEL
ALT: 13 U/L (ref 0–44)
AST: 10 U/L — ABNORMAL LOW (ref 15–41)
Albumin: 3.5 g/dL (ref 3.5–5.0)
Alkaline Phosphatase: 78 U/L (ref 38–126)
Anion gap: 8 (ref 5–15)
BUN: 11 mg/dL (ref 6–20)
CO2: 28 mmol/L (ref 22–32)
Calcium: 9.4 mg/dL (ref 8.9–10.3)
Chloride: 102 mmol/L (ref 98–111)
Creatinine, Ser: 0.72 mg/dL (ref 0.44–1.00)
GFR calc Af Amer: >60 mL/min (ref >60)
GFR calc non Af Amer: >60 mL/min (ref >60)
Glucose, Bld: 91 mg/dL (ref 70–99)
Potassium: 3.7 mmol/L (ref 3.5–5.1)
Sodium: 138 mmol/L (ref 135–145)
Total Bilirubin: 0.2 mg/dL — ABNORMAL LOW (ref 0.3–1.2)
Total Protein: 7 g/dL (ref 6.5–8.1)

## 2017-08-24 LAB — CBC WITH DIFFERENTIAL (CANCER CENTER ONLY)
Basophils Absolute: 0 10*3/uL (ref 0.0–0.1)
Basophils Relative: 0 %
Eosinophils Absolute: 0.1 10*3/uL (ref 0.0–0.5)
Eosinophils Relative: 1 %
HCT: 38 % (ref 34.8–46.6)
Hemoglobin: 11.9 g/dL (ref 11.6–15.9)
Lymphocytes Relative: 15 %
Lymphs Abs: 2.7 10*3/uL (ref 0.9–3.3)
MCH: 29.5 pg (ref 25.1–34.0)
MCHC: 31.3 g/dL — ABNORMAL LOW (ref 31.5–36.0)
MCV: 94.3 fL (ref 79.5–101.0)
Monocytes Absolute: 1 10*3/uL — ABNORMAL HIGH (ref 0.1–0.9)
Monocytes Relative: 6 %
Neutro Abs: 14 10*3/uL — ABNORMAL HIGH (ref 1.5–6.5)
Neutrophils Relative %: 78 %
Platelet Count: 317 10*3/uL (ref 145–400)
RBC: 4.03 MIL/uL (ref 3.70–5.45)
RDW: 14.8 % — ABNORMAL HIGH (ref 11.2–14.5)
WBC Count: 17.8 10*3/uL — ABNORMAL HIGH (ref 3.9–10.3)

## 2017-08-24 LAB — LIPASE, BLOOD: Lipase: 40 U/L (ref 11–51)

## 2017-08-24 NOTE — Progress Notes (Signed)
Consult Note: GYN-ONC New Patient FIRST VISIT  Consult was requested by Dr. Freda Munro   Chief Complaint  Patient presents with  . Ovarian mass  . Elevated CA-125    HPI: [name]  is a very nice 42 y.o.  P2  She has a h/o cecal cancer s/p right hemicolectomy 2001.  She had back pain for a few months which she thought was related to the things she had to lift at work. She went to see orthopedics and exercises were prescribed, which helped. The back pain returned and then was accompanied by "stomach" pains. She had at the same time a MRSA infection of her thighs and was having her medications altered. She attributed the "stomach" pains to the medications. When things continued to bother her after coming off the antibiotics she followed up with GI, given her colon CA history.   GI set her up for scopes (09/01/17) upper and lower, and ordered CTimaging. Last colonoscopy ~3 years ago had "noncancerous" polyps.  CT abdomen pelvis of 08/18/2017; compared to 12/2006  Hepatobiliary - hemangioma in the right lobe of liver stable. Small amount of perihepatic ascites present. Abnormality of the tail of the pancreas - could represent a mild pancreatitis and early pseudocyst formation although pancreatic neoplasm cannot be excluded. Recommend correlation with lab values. The pancreatic duct distally may be somewhat dilated which is worrisome for malignancy.   Spleen: The spleen is unremarkable.   The colon is unremarkable the anastomosis of colon and small bowel is unremarkable with no complicating features.   The uterus is unremarkable with some low-attenuation centrally possibly related to the patient's menstrual cycle. The left adnexa is unremarkable. However there is a large mass emanating from the right adnexa measuring approximately 10.8 x 8.0 cm with mixed attenuation very worrisome for right ovarian carcinoma. Ascites layers within the pelvis.   I spoke to GI (PA) who have  peripherally reviewed the above with their pancreatic specialist and they feel she should proceed with addressing the adnexa before further workup of the pancreas. Some pancreatic labs were drawn and reportedly normal so they are not overly concerned about pancreatitis.   Patient does admit to EtOH 2-3 times daily after work. Negative for CAGE questions and states she can go for days without EtOH.  Given the adnexal mass she was referred to Dr. Ouida Sills who ordered CA125 and CEA and referred her here.   In addition to the above the patient's last 2 menstrual cycles have been heavier and longer than normal. No intermenstrual bleeding. She does have some nausea, no vomiting. Has loss of appetite, some GI changes (diarrhea), bloating and still the back pain. Noting subjective fevers, but when checks temperature she is 99. Had night sweats last night. Is noting some hot flashes.  Has notable family history and denies any genetic testing. Partial colectomy was 2001 and I don't see they did MMR.  Imported EPIC Oncologic History:    No history exists.    Measurement of disease:  CA125  08/23/17 = 139.8  CEA  08/23/17 = 1.1  CA19-9 will be drawn today 08/24/17 . _______  Radiology: Ct Abdomen Pelvis W Contrast  Result Date: 08/18/2017 CLINICAL DATA:  History of adenocarcinoma of the cecum with chemotherapy completed in 2002, now with upper abdominal pain and bloating EXAM: CT ABDOMEN AND PELVIS WITH CONTRAST TECHNIQUE: Multidetector CT imaging of the abdomen and pelvis was performed using the standard protocol following bolus administration of intravenous contrast. CONTRAST:  111m ISOVUE-300 IOPAMIDOL (ISOVUE-300) INJECTION 61% COMPARISON:  CT abdomen pelvis of 01/16/2007 FINDINGS: Lower chest: The opacities noted previously within the lung bases have cleared. No parenchymal infiltrate or pleural effusion is seen. Hepatobiliary: The liver enhances and the previously noted hemangioma in the right  lobe of liver peripherally is stable. There is a small amount of perihepatic ascites present. No calcified gallstones are seen. Pancreas: The head and body the pancreas is unremarkable. However there is abnormality of the tail of the pancreas with the very prostatic fat planes somewhat indistinct and small low-attenuation cc within the tail. This could represent a mild pancreatitis and early pseudocyst formation although pancreatic neoplasm cannot be excluded. Recommend correlation with lab values. The pancreatic duct distally may be somewhat dilated which is worrisome for malignancy. Spleen: The spleen is unremarkable. Adrenals/Urinary Tract: The adrenal glands appear normal. The kidneys enhance and there are small nonobstructing renal calculi bilaterally. The ureters appear normal caliber to the urinary bladder. The urinary bladder is unremarkable. Stomach/Bowel: The stomach is not well distended but no abnormality is evident. No abnormality of small bowel is seen. There is a tiny amount of ascites within the peritoneal cavity. The colon is unremarkable the anastomosis of colon and small bowel is unremarkable with no complicating features. There are a few small nodes present in the right lower quadrant none of which are pathologically enlarged. Vascular/Lymphatic: The abdominal aorta is normal in caliber. No adenopathy is seen. Reproductive: The uterus is unremarkable with some low-attenuation centrally possibly related to the patient's menstrual cycle. The left adnexa is unremarkable. However there is a large mass emanating from the right adnexa measuring approximately 10.8 x 8.0 cm with mixed attenuation very worrisome for right ovarian carcinoma. Ascites layers within the pelvis. Other: There is small amount of ascites within the abdomen and pelvis. Musculoskeletal: The lumbar vertebrae are in normal alignment with normal intervertebral disc spaces. No lytic or blastic lesion is seen. IMPRESSION: 1. Mixed  attenuation mass emanates from the right adnexa measuring 10.8 x 8.0 cm very suspicious for right ovarian carcinoma. 2. Abnormality of the tail of the pancreas may be due to mild pancreatitis and small pseudocyst formation, but neoplasm cannot be excluded. 3. Small amount of ascites within abdomen and pelvis. 4. Small nonobstructing renal calculi bilaterally. Electronically Signed   By: PIvar DrapeM.D.   On: 08/18/2017 14:09     Current Meds:  Outpatient Encounter Medications as of 08/24/2017  Medication Sig  . diclofenac sodium (VOLTAREN) 1 % GEL APPLY 3 GM TO AFFECTED 3 LARGE JOINTS UP TO 3 TIMES A DAY AS NEEDED AS DIRECTED  . loteprednol (LOTEMAX) 0.5 % ophthalmic suspension Place 1 drop into both eyes at bedtime.   . prednisoLONE acetate (PRED FORTE) 1 % ophthalmic suspension Place 1 drop into both eyes every 4 (four) hours as needed (while awake).   . predniSONE (DELTASONE) 10 MG tablet Take 1 tablet (10 mg total) by mouth daily with breakfast.  . [DISCONTINUED] cetirizine (ZYRTEC) 10 MG tablet Take 10 mg by mouth daily.  . [DISCONTINUED] Fexofenadine HCl (ALLEGRA PO) Take by mouth daily.  . [DISCONTINUED] fluticasone (FLONASE) 50 MCG/ACT nasal spray Flonase 50 mcg/actuation nasal spray,suspension  Inhale 2 sprays every day by intranasal route for 30 days.  . [DISCONTINUED] folic acid (FOLVITE) 1 MG tablet Take 2 tablets (2 mg total) by mouth daily.  . [DISCONTINUED] Methotrexate, PF, 20 MG/0.4ML SOAJ Inject 1 pen into the skin once a week.  . [DISCONTINUED] OMaureen ChattersCLICKJECT  125 MG/ML SOAJ INJECT 125 MG INTO THE SKIN ONCE A WEEK  . [DISCONTINUED] azelastine (ASTELIN) 0.1 % nasal spray as needed.   . [DISCONTINUED] clindamycin (CLINDAGEL) 1 % gel Apply topically 2 (two) times daily. (Patient not taking: Reported on 08/24/2017)  . [DISCONTINUED] Omega-3 Fatty Acids (FISH OIL) 1000 MG CAPS Take by mouth.   No facility-administered encounter medications on file as of 08/24/2017.     Allergy:   Allergies  Allergen Reactions  . Codeine Other (See Comments)    headaches  . Penicillins     Severe headaches  . Bactrim [Sulfamethoxazole-Trimethoprim] Rash    Past Surgical Hx:  Past Surgical History:  Procedure Laterality Date  . BREAST LUMPECTOMY WITH RADIOACTIVE SEED LOCALIZATION Left 06/28/2014   Benign Procedure: RADIOACTIVE SEED LOCALIZATION LEFT BREAST LUMPECTOMY;  Surgeon: Excell Seltzer, MD;  Location: [place];  Service: General;  Laterality: Left;  . COLONOSCOPY  06/19/14  . EYE SURGERY Left    plug in tear duct  . KNEE ARTHROSCOPY    . PORT A CATH REVISION  2001   in and out  . RIGHT COLECTOMY  06/02/2009   cecum cancer    Past Medical Hx:  Past Medical History:  Diagnosis Date  . Allergic rhinitis, seasonal   . Cecal cancer ([place]) 2001   Stage II (T3N0)  04-11-2001cecum-partial colectomy and completed chemo 2002  . History of MRSA infection 01/2017   followed by infectious disease center--  recurrent pustular folliculitis  . Nephrolithiasis    bilateral nonobstructive calculi per CT 08-18-2017  . OA (osteoarthritis)   . Rheumatoid arthritis [facility])    rheumatologist-  dr devenswar-- treated w/ oral prednisone daily and methotrexate injection every 3 wks    Past Gynecological History:   GYNECOLOGIC HISTORY:  Patient's last menstrual period was 08/04/2017. Menarche: 42 years old P 2 LMP 08/04/2017 - see HPI Contraceptive >5 yrs OCP HRT none  Last Pap 03/2017 neg (no HPV done)  Family Hx:  Family History  Problem Relation Age of Onset  . Arthritis/Rheumatoid Mother   . Uterine cancer Mother   . Allergic rhinitis Father   . Heart disease Father   . Drug abuse Son   . Other Maternal Aunt        Unknown GYN CA  . Colon cancer Maternal Uncle   . Angioedema Neg Hx   . Asthma Neg Hx   . Atopy Neg Hx   . Eczema Neg Hx   . Immunodeficiency Neg Hx   . Urticaria Neg Hx     Social Hx:  Tobacco use: Former smoker 19 years. Quit  1997 Alcohol use: 2-3 EtOH per day. Illicit Drug use: none Illicit IV Drug use: none  Review of Systems:  Review of Systems  Constitutional: Positive for chills, fatigue, fever and unexpected weight change.  Gastrointestinal: Positive for abdominal distention, abdominal pain, constipation, diarrhea and nausea.  Endocrine: Positive for hot flashes.  Genitourinary: Positive for vaginal bleeding.   Musculoskeletal: Positive for back pain.  Neurological: Positive for headaches.  Psychiatric/Behavioral: The patient is nervous/anxious.   All other systems reviewed and are negative.   Vitals:  Blood pressure (!) 150/87, pulse (!) 105, temperature 99 F (37.2 C), temperature source Oral, resp. rate 20, height '5\' 2"'  (1.575 m), weight 240 lb 6.4 oz (109 kg), last menstrual period 08/04/2017, SpO2 100 %. Body mass index is 43.97 kg/m.   Physical Exam:  General :  Overweight, well developed, 42 y.o., female in no  apparent distress HEENT:  Normocephalic/atraumatic, symmetric, EOMI, eyelids normal Neck:   Supple, no masses.  Lymphatics:  No cervical/ submandibular/ supraclavicular/ infraclavicular/ inguinal adenopathy Respiratory:  Respirations unlabored, no use of accessory muscles CV:   Deferred Breast:  Deferred Musculoskeletal: No CVA tenderness, normal muscle strength. Abdomen:  Soft, non-tender and nondistended. No evidence of hernia. No masses. Extremities:  No lymphedema, no erythema, non-tender. Skin:   Normal inspection Neuro/Psych:  No focal motor deficit, no abnormal mental status. Normal gait. Normal affect. Alert and oriented to person, place, and time  Genito Urinary: Vulva: Normal external female genitalia.  Bladder/urethra: Urethral meatus normal in size and location. No lesions or   masses, well supported bladder Speculum exam: Vagina: No lesion, no discharge, no bleeding. Cervix: Normal appearing, no lesions. No discharge. Bimanual exam: No CMT Uterus: Difficult to  delineate given habitus. Mobile.  Adnexa: No masses but again difficult exam given habitus Rectovaginal:  Good tone, no masses, no cul de sac nodularity, no parametrial involvement or nodularity.   Assessment: Pelvic mass  Plan: 1. Pelvic mass o We discussed possible etiologies o Obviously we want to rule out malignancy and with the elevated CA125 and complex nature of the mass I cannot provide reassurance without histology. o We talked about modalities of surgery and hope that we can perform laparoscopically. Given her prior cecal resection there is a risk of adhesive disease and conversion to open. 2. Pancreatic mass/lesion o Will defer to her GI. o If we proceed with surgery and she delays her upper/lower endoscopy then when she reschedules she can discuss workup with them. I suspect an ERCP will be done. o Will check labs including amylase and CA19-9 to see if possible pancreatitis/CA 3. H/o cecal CA o She doesn't look to have had genetic testing or tumor profiling done, perhaps because the resection was 2011. o I will refer her to genetics pending surgical recovery. o Given the CA125 and complexity of the adnexal mass I don't think we should wait for genetics to proceed to surgery. 4. Surgical discussion/plan o We reviewed the R/B/A along with the surgical sketch. She will be given a copy of that today. o We talked about bilateral salpingectomy and hysterectomy should the lesion turn out to be benign. o Ideally the main goal is to get the mass out, determine its etiology, and then do no harm. If the uterus/contralateral tube are surrounded by fibroisis/scar then I would not remove. o She understands if, on frozen section, malignancy or borderline tumor is found then the recommendation is for total hysterectomy/BSO and staging.   Isabel Caprice, MD  08/25/2017, 4:02 PM  Approximately 60 minutes spent with the patient face-to-face with > 50% of this time spent on counseling and  coordination of care.    Cc: Freda Munro, MD (Referring Ob/Gyn) [name], MD (PCP) Laurence Spates, MD (GI)

## 2017-08-24 NOTE — Patient Instructions (Addendum)
Dana Hicks  08/24/2017   Your procedure is scheduled on:  08-30-2017  Report to [facility] Main  Entrance  Report to admitting at  5:30 AM    Call this number if you have problems the morning of surgery [phone]   Remember: Do not eat food  :After Midnight. Clear liquids from midnight until 430 am morning of surgery, then nothing by mouth.               Eat a light diet the day before surgery.  Examples including soups, broths, toast, yogurt, mashed potatoes.  Things to avoid include carbonated beverages (fizzy beverages),             raw fruits and raw vegetables, or beans.              If your bowels are filled with gas, your surgeon will have difficulty visualizing your pelvic organs which increases your surgical risks.   Take these medicines the morning of surgery with A SIP OF WATER:  Allegra, Prednisone, Flonase, Prilosec,  Eye drops as usual                               You may not have any metal on your body including hair pins and              piercings  Do not wear jewelry, make-up, lotions, powders or perfumes, deodorant             Do not wear nail polish.  Do not shave  48 hours prior to surgery.     Do not bring valuables to the hospital. [place].  Contacts, dentures or bridgework may not be worn into surgery.  Leave suitcase in the car. After surgery it may be brought to your room.   Special Instructions: N/A              Please read over the following fact sheets you were given: _____________________________________________________________________     Incentive Spirometer  An incentive spirometer is a tool that can help keep your lungs clear and active. This tool measures how well you are filling your lungs with each breath. Taking long deep breaths may help reverse or decrease the chance of developing breathing (pulmonary) problems (especially infection) following:  A long  period of time when you are unable to move or be active. BEFORE THE PROCEDURE   If the spirometer includes an indicator to show your best effort, your nurse or respiratory therapist will set it to a desired goal.  If possible, sit up straight or lean slightly forward. Try not to slouch.  Hold the incentive spirometer in an upright position. INSTRUCTIONS FOR USE  1. Sit on the edge of your bed if possible, or sit up as far as you can in bed or on a chair. 2. Hold the incentive spirometer in an upright position. 3. Breathe out normally. 4. Place the mouthpiece in your mouth and seal your lips tightly around it. 5. Breathe in slowly and as deeply as possible, raising the piston or the ball toward the top of the column. 6. Hold your breath for 3-5 seconds or for as long as possible. Allow the piston or ball to fall  to the bottom of the column. 7. Remove the mouthpiece from your mouth and breathe out normally. 8. Rest for a few seconds and repeat Steps 1 through 7 at least 10 times every 1-2 hours when you are awake. Take your time and take a few normal breaths between deep breaths. 9. The spirometer may include an indicator to show your best effort. Use the indicator as a goal to work toward during each repetition. 10. After each set of 10 deep breaths, practice coughing to be sure your lungs are clear. If you have an incision (the cut made at the time of surgery), support your incision when coughing by placing a pillow or rolled up towels firmly against it. Once you are able to get out of bed, walk around indoors and cough well. You may stop using the incentive spirometer when instructed by your caregiver.  RISKS AND COMPLICATIONS  Take your time so you do not get dizzy or light-headed.  If you are in pain, you may need to take or ask for pain medication before doing incentive spirometry. It is harder to take a deep breath if you are having pain. AFTER USE  Rest and breathe slowly and  easily.  It can be helpful to keep track of a log of your progress. Your caregiver can provide you with a simple table to help with this. If you are using the spirometer at home, follow these instructions: [place] IF:   You are having difficultly using the spirometer.  You have trouble using the spirometer as often as instructed.  Your pain medication is not giving enough relief while using the spirometer.  You develop fever of 100.5 F (38.1 C) or higher. SEEK IMMEDIATE MEDICAL CARE IF:   You cough up bloody sputum that had not been present before.  You develop fever of 102 F (38.9 C) or greater.  You develop worsening pain at or near the incision site. MAKE SURE YOU:   Understand these instructions.  Will watch your condition.  Will get help right away if you are not doing well or get worse. Document Released: 06/21/2006 Document Revised: 05/03/2011 Document Reviewed: 08/22/2006 ExitCare Patient Information 2014 ExitCare, [place].   ________________________________________________________________________  WHAT IS A BLOOD TRANSFUSION? Blood Transfusion Information  A transfusion is the replacement of blood or some of its parts. Blood is made up of multiple cells which provide different functions.  Red blood cells carry oxygen and are used for blood loss replacement.  White blood cells fight against infection.  Platelets control bleeding.  Plasma helps clot blood.  Other blood products are available for specialized needs, such as hemophilia or other clotting disorders. BEFORE THE TRANSFUSION  Who gives blood for transfusions?   Healthy volunteers who are fully evaluated to make sure their blood is safe. This is blood bank blood. Transfusion therapy is the safest it has ever been in the practice of medicine. Before blood is taken from a donor, a complete history is taken to make sure that person has no history of diseases nor engages in risky social  behavior (examples are intravenous drug use or sexual activity with multiple partners). The donor's travel history is screened to minimize risk of transmitting infections, such as malaria. The donated blood is tested for signs of infectious diseases, such as HIV and hepatitis. The blood is then tested to be sure it is compatible with you in order to minimize the chance of a transfusion reaction. If you or a relative donates  blood, this is often done in anticipation of surgery and is not appropriate for emergency situations. It takes many days to process the donated blood. RISKS AND COMPLICATIONS Although transfusion therapy is very safe and saves many lives, the main dangers of transfusion include:   Getting an infectious disease.  Developing a transfusion reaction. This is an allergic reaction to something in the blood you were given. Every precaution is taken to prevent this. The decision to have a blood transfusion has been considered carefully by your caregiver before blood is given. Blood is not given unless the benefits outweigh the risks. AFTER THE TRANSFUSION  Right after receiving a blood transfusion, you will usually feel much better and more energetic. This is especially true if your red blood cells have gotten low (anemic). The transfusion raises the level of the red blood cells which carry oxygen, and this usually causes an energy increase.  The nurse administering the transfusion will monitor you carefully for complications. HOME CARE INSTRUCTIONS  No special instructions are needed after a transfusion. You may find your energy is better. Speak with your caregiver about any limitations on activity for underlying diseases you may have. SEEK MEDICAL CARE IF:   Your condition is not improving after your transfusion.  You develop redness or irritation at the intravenous (IV) site. SEEK IMMEDIATE MEDICAL CARE IF:  Any of the following symptoms occur over the next 12 hours:  Shaking  chills.  You have a temperature by mouth above 102 F (38.9 C), not controlled by medicine.  Chest, back, or muscle pain.  People around you feel you are not acting correctly or are confused.  Shortness of breath or difficulty breathing.  Dizziness and fainting.  You get a rash or develop hives.  You have a decrease in urine output.  Your urine turns a dark color or changes to pink, red, or brown. Any of the following symptoms occur over the next 10 days:  You have a temperature by mouth above 102 F (38.9 C), not controlled by medicine.  Shortness of breath.  Weakness after normal activity.  The white part of the eye turns yellow (jaundice).  You have a decrease in the amount of urine or are urinating less often.  Your urine turns a dark color or changes to pink, red, or brown. Document Released: 02/06/2000 Document Revised: 05/03/2011 Document Reviewed: 09/25/2007 ExitCare Patient Information 2014 [place].  _______________________________________________________________________          [facility] - Preparing for Surgery Before surgery, you can play an important role.  Because skin is not sterile, your skin needs to be as free of germs as possible.  You can reduce the number of germs on your skin by washing with CHG (chlorahexidine gluconate) soap before surgery.  CHG is an antiseptic cleaner which kills germs and bonds with the skin to continue killing germs even after washing. Please DO NOT use if you have an allergy to CHG or antibacterial soaps.  If your skin becomes reddened/irritated stop using the CHG and inform your nurse when you arrive at Short Stay. Do not shave (including legs and underarms) for at least 48 hours prior to the first CHG shower.  You may shave your face/neck. Please follow these instructions carefully:  1.  Shower with CHG Soap the night before surgery and the  morning of Surgery.  2.  If you choose to wash your hair, wash your hair  first as usual with your  normal  shampoo.  3.  After you shampoo, rinse your hair and body thoroughly to remove the  shampoo.                           4.  Use CHG as you would any other liquid soap.  You can apply chg directly  to the skin and wash                       Gently with a scrungie or clean washcloth.  5.  Apply the CHG Soap to your body ONLY FROM THE NECK DOWN.   Do not use on face/ open                           Wound or open sores. Avoid contact with eyes, ears mouth and genitals (private parts).                       Wash face,  Genitals (private parts) with your normal soap.             6.  Wash thoroughly, paying special attention to the area where your surgery  will be performed.  7.  Thoroughly rinse your body with warm water from the neck down.  8.  DO NOT shower/wash with your normal soap after using and rinsing off  the CHG Soap.                9.  Pat yourself dry with a clean towel.            10.  Wear clean pajamas.            11.  Place clean sheets on your bed the night of your first shower and do not  sleep with pets. Day of Surgery : Do not apply any lotions/deodorants the morning of surgery.  Please wear clean clothes to the hospital/surgery center.  FAILURE TO FOLLOW THESE INSTRUCTIONS MAY RESULT IN THE CANCELLATION OF YOUR SURGERY PATIENT SIGNATURE_________________________________  NURSE SIGNATURE__________________________________  ________________________________________________________________________

## 2017-08-24 NOTE — H&P (View-Only) (Signed)
Consult Note: GYN-ONC New Patient FIRST VISIT  Consult was requested by Dr. Freda Munro   Chief Complaint  Patient presents with  . Ovarian mass  . Elevated CA-125    HPI: [name]  is a very nice 42 y.o.  P2  She has a h/o cecal cancer s/p right hemicolectomy 2001.  She had back pain for a few months which she thought was related to the things she had to lift at work. She went to see orthopedics and exercises were prescribed, which helped. The back pain returned and then was accompanied by "stomach" pains. She had at the same time a MRSA infection of her thighs and was having her medications altered. She attributed the "stomach" pains to the medications. When things continued to bother her after coming off the antibiotics she followed up with GI, given her colon CA history.   GI set her up for scopes (09/01/17) upper and lower, and ordered CTimaging. Last colonoscopy ~3 years ago had "noncancerous" polyps.  CT abdomen pelvis of 08/18/2017; compared to 12/2006  Hepatobiliary - hemangioma in the right lobe of liver stable. Small amount of perihepatic ascites present. Abnormality of the tail of the pancreas - could represent a mild pancreatitis and early pseudocyst formation although pancreatic neoplasm cannot be excluded. Recommend correlation with lab values. The pancreatic duct distally may be somewhat dilated which is worrisome for malignancy.   Spleen: The spleen is unremarkable.   The colon is unremarkable the anastomosis of colon and small bowel is unremarkable with no complicating features.   The uterus is unremarkable with some low-attenuation centrally possibly related to the patient's menstrual cycle. The left adnexa is unremarkable. However there is a large mass emanating from the right adnexa measuring approximately 10.8 x 8.0 cm with mixed attenuation very worrisome for right ovarian carcinoma. Ascites layers within the pelvis.   I spoke to GI (PA) who have  peripherally reviewed the above with their pancreatic specialist and they feel she should proceed with addressing the adnexa before further workup of the pancreas. Some pancreatic labs were drawn and reportedly normal so they are not overly concerned about pancreatitis.   Patient does admit to EtOH 2-3 times daily after work. Negative for CAGE questions and states she can go for days without EtOH.  Given the adnexal mass she was referred to Dr. Ouida Sills who ordered CA125 and CEA and referred her here.   In addition to the above the patient's last 2 menstrual cycles have been heavier and longer than normal. No intermenstrual bleeding. She does have some nausea, no vomiting. Has loss of appetite, some GI changes (diarrhea), bloating and still the back pain. Noting subjective fevers, but when checks temperature she is 99. Had night sweats last night. Is noting some hot flashes.  Has notable family history and denies any genetic testing. Partial colectomy was 2001 and I don't see they did MMR.  Imported EPIC Oncologic History:    No history exists.    Measurement of disease:  CA125  08/23/17 = 139.8  CEA  08/23/17 = 1.1  CA19-9 will be drawn today 08/24/17 . _______  Radiology: Ct Abdomen Pelvis W Contrast  Result Date: 08/18/2017 CLINICAL DATA:  History of adenocarcinoma of the cecum with chemotherapy completed in 2002, now with upper abdominal pain and bloating EXAM: CT ABDOMEN AND PELVIS WITH CONTRAST TECHNIQUE: Multidetector CT imaging of the abdomen and pelvis was performed using the standard protocol following bolus administration of intravenous contrast. CONTRAST:  134m ISOVUE-300 IOPAMIDOL (ISOVUE-300) INJECTION 61% COMPARISON:  CT abdomen pelvis of 01/16/2007 FINDINGS: Lower chest: The opacities noted previously within the lung bases have cleared. No parenchymal infiltrate or pleural effusion is seen. Hepatobiliary: The liver enhances and the previously noted hemangioma in the right  lobe of liver peripherally is stable. There is a small amount of perihepatic ascites present. No calcified gallstones are seen. Pancreas: The head and body the pancreas is unremarkable. However there is abnormality of the tail of the pancreas with the very prostatic fat planes somewhat indistinct and small low-attenuation cc within the tail. This could represent a mild pancreatitis and early pseudocyst formation although pancreatic neoplasm cannot be excluded. Recommend correlation with lab values. The pancreatic duct distally may be somewhat dilated which is worrisome for malignancy. Spleen: The spleen is unremarkable. Adrenals/Urinary Tract: The adrenal glands appear normal. The kidneys enhance and there are small nonobstructing renal calculi bilaterally. The ureters appear normal caliber to the urinary bladder. The urinary bladder is unremarkable. Stomach/Bowel: The stomach is not well distended but no abnormality is evident. No abnormality of small bowel is seen. There is a tiny amount of ascites within the peritoneal cavity. The colon is unremarkable the anastomosis of colon and small bowel is unremarkable with no complicating features. There are a few small nodes present in the right lower quadrant none of which are pathologically enlarged. Vascular/Lymphatic: The abdominal aorta is normal in caliber. No adenopathy is seen. Reproductive: The uterus is unremarkable with some low-attenuation centrally possibly related to the patient's menstrual cycle. The left adnexa is unremarkable. However there is a large mass emanating from the right adnexa measuring approximately 10.8 x 8.0 cm with mixed attenuation very worrisome for right ovarian carcinoma. Ascites layers within the pelvis. Other: There is small amount of ascites within the abdomen and pelvis. Musculoskeletal: The lumbar vertebrae are in normal alignment with normal intervertebral disc spaces. No lytic or blastic lesion is seen. IMPRESSION: 1. Mixed  attenuation mass emanates from the right adnexa measuring 10.8 x 8.0 cm very suspicious for right ovarian carcinoma. 2. Abnormality of the tail of the pancreas may be due to mild pancreatitis and small pseudocyst formation, but neoplasm cannot be excluded. 3. Small amount of ascites within abdomen and pelvis. 4. Small nonobstructing renal calculi bilaterally. Electronically Signed   By: PIvar DrapeM.D.   On: 08/18/2017 14:09     Current Meds:  Outpatient Encounter Medications as of 08/24/2017  Medication Sig  . diclofenac sodium (VOLTAREN) 1 % GEL APPLY 3 GM TO AFFECTED 3 LARGE JOINTS UP TO 3 TIMES A DAY AS NEEDED AS DIRECTED  . loteprednol (LOTEMAX) 0.5 % ophthalmic suspension Place 1 drop into both eyes at bedtime.   . prednisoLONE acetate (PRED FORTE) 1 % ophthalmic suspension Place 1 drop into both eyes every 4 (four) hours as needed (while awake).   . predniSONE (DELTASONE) 10 MG tablet Take 1 tablet (10 mg total) by mouth daily with breakfast.  . [DISCONTINUED] cetirizine (ZYRTEC) 10 MG tablet Take 10 mg by mouth daily.  . [DISCONTINUED] Fexofenadine HCl (ALLEGRA PO) Take by mouth daily.  . [DISCONTINUED] fluticasone (FLONASE) 50 MCG/ACT nasal spray Flonase 50 mcg/actuation nasal spray,suspension  Inhale 2 sprays every day by intranasal route for 30 days.  . [DISCONTINUED] folic acid (FOLVITE) 1 MG tablet Take 2 tablets (2 mg total) by mouth daily.  . [DISCONTINUED] Methotrexate, PF, 20 MG/0.4ML SOAJ Inject 1 pen into the skin once a week.  . [DISCONTINUED] OMaureen ChattersCLICKJECT  125 MG/ML SOAJ INJECT 125 MG INTO THE SKIN ONCE A WEEK  . [DISCONTINUED] azelastine (ASTELIN) 0.1 % nasal spray as needed.   . [DISCONTINUED] clindamycin (CLINDAGEL) 1 % gel Apply topically 2 (two) times daily. (Patient not taking: Reported on 08/24/2017)  . [DISCONTINUED] Omega-3 Fatty Acids (FISH OIL) 1000 MG CAPS Take by mouth.   No facility-administered encounter medications on file as of 08/24/2017.     Allergy:   Allergies  Allergen Reactions  . Codeine Other (See Comments)    headaches  . Penicillins     Severe headaches  . Bactrim [Sulfamethoxazole-Trimethoprim] Rash    Past Surgical Hx:  Past Surgical History:  Procedure Laterality Date  . BREAST LUMPECTOMY WITH RADIOACTIVE SEED LOCALIZATION Left 06/28/2014   Benign Procedure: RADIOACTIVE SEED LOCALIZATION LEFT BREAST LUMPECTOMY;  Surgeon: Excell Seltzer, MD;  Location: [facility];  Service: General;  Laterality: Left;  . COLONOSCOPY  06/19/14  . EYE SURGERY Left    plug in tear duct  . KNEE ARTHROSCOPY    . PORT A CATH REVISION  2001   in and out  . RIGHT COLECTOMY  06/02/2009   cecum cancer    Past Medical Hx:  Past Medical History:  Diagnosis Date  . Allergic rhinitis, seasonal   . Cecal cancer ([place]) 2001   Stage II (T3N0)  04-11-2001cecum-partial colectomy and completed chemo 2002  . History of MRSA infection 01/2017   followed by infectious disease center--  recurrent pustular folliculitis  . Nephrolithiasis    bilateral nonobstructive calculi per CT 08-18-2017  . OA (osteoarthritis)   . Rheumatoid arthritis [facility])    rheumatologist-  dr devenswar-- treated w/ oral prednisone daily and methotrexate injection every 3 wks    Past Gynecological History:   GYNECOLOGIC HISTORY:  Patient's last menstrual period was 08/04/2017. Menarche: 42 years old P 2 LMP 08/04/2017 - see HPI Contraceptive >5 yrs OCP HRT none  Last Pap 03/2017 neg (no HPV done)  Family Hx:  Family History  Problem Relation Age of Onset  . Arthritis/Rheumatoid Mother   . Uterine cancer Mother   . Allergic rhinitis Father   . Heart disease Father   . Drug abuse Son   . Other Maternal Aunt        Unknown GYN CA  . Colon cancer Maternal Uncle   . Angioedema Neg Hx   . Asthma Neg Hx   . Atopy Neg Hx   . Eczema Neg Hx   . Immunodeficiency Neg Hx   . Urticaria Neg Hx     Social Hx:  Tobacco use: Former smoker 19 years. Quit  1997 Alcohol use: 2-3 EtOH per day. Illicit Drug use: none Illicit IV Drug use: none  Review of Systems:  Review of Systems  Constitutional: Positive for chills, fatigue, fever and unexpected weight change.  Gastrointestinal: Positive for abdominal distention, abdominal pain, constipation, diarrhea and nausea.  Endocrine: Positive for hot flashes.  Genitourinary: Positive for vaginal bleeding.   Musculoskeletal: Positive for back pain.  Neurological: Positive for headaches.  Psychiatric/Behavioral: The patient is nervous/anxious.   All other systems reviewed and are negative.   Vitals:  Blood pressure (!) 150/87, pulse (!) 105, temperature 99 F (37.2 C), temperature source Oral, resp. rate 20, height '5\' 2"'  (1.575 m), weight 240 lb 6.4 oz (109 kg), last menstrual period 08/04/2017, SpO2 100 %. Body mass index is 43.97 kg/m.   Physical Exam:  General :  Overweight, well developed, 42 y.o., female in no  apparent distress HEENT:  Normocephalic/atraumatic, symmetric, EOMI, eyelids normal Neck:   Supple, no masses.  Lymphatics:  No cervical/ submandibular/ supraclavicular/ infraclavicular/ inguinal adenopathy Respiratory:  Respirations unlabored, no use of accessory muscles CV:   Deferred Breast:  Deferred Musculoskeletal: No CVA tenderness, normal muscle strength. Abdomen:  Soft, non-tender and nondistended. No evidence of hernia. No masses. Extremities:  No lymphedema, no erythema, non-tender. Skin:   Normal inspection Neuro/Psych:  No focal motor deficit, no abnormal mental status. Normal gait. Normal affect. Alert and oriented to person, place, and time  Genito Urinary: Vulva: Normal external female genitalia.  Bladder/urethra: Urethral meatus normal in size and location. No lesions or   masses, well supported bladder Speculum exam: Vagina: No lesion, no discharge, no bleeding. Cervix: Normal appearing, no lesions. No discharge. Bimanual exam: No CMT Uterus: Difficult to  delineate given habitus. Mobile.  Adnexa: No masses but again difficult exam given habitus Rectovaginal:  Good tone, no masses, no cul de sac nodularity, no parametrial involvement or nodularity.   Assessment: Pelvic mass  Plan: 1. Pelvic mass o We discussed possible etiologies o Obviously we want to rule out malignancy and with the elevated CA125 and complex nature of the mass I cannot provide reassurance without histology. o We talked about modalities of surgery and hope that we can perform laparoscopically. Given her prior cecal resection there is a risk of adhesive disease and conversion to open. 2. Pancreatic mass/lesion o Will defer to her GI. o If we proceed with surgery and she delays her upper/lower endoscopy then when she reschedules she can discuss workup with them. I suspect an ERCP will be done. o Will check labs including amylase and CA19-9 to see if possible pancreatitis/CA 3. H/o cecal CA o She doesn't look to have had genetic testing or tumor profiling done, perhaps because the resection was 2011. o I will refer her to genetics pending surgical recovery. o Given the CA125 and complexity of the adnexal mass I don't think we should wait for genetics to proceed to surgery. 4. Surgical discussion/plan o We reviewed the R/B/A along with the surgical sketch. She will be given a copy of that today. o We talked about bilateral salpingectomy and hysterectomy should the lesion turn out to be benign. o Ideally the main goal is to get the mass out, determine its etiology, and then do no harm. If the uterus/contralateral tube are surrounded by fibroisis/scar then I would not remove. o She understands if, on frozen section, malignancy or borderline tumor is found then the recommendation is for total hysterectomy/BSO and staging.   Isabel Caprice, MD  08/25/2017, 4:02 PM  Approximately 60 minutes spent with the patient face-to-face with > 50% of this time spent on counseling and  coordination of care.    Cc: Freda Munro, MD (Referring Ob/Gyn) [name], MD (PCP) Laurence Spates, MD (GI)

## 2017-08-24 NOTE — Patient Instructions (Signed)
Preparing for your Surgery  Plan for surgery on August 30, 2017 with Dr. Precious Haws at [place] will be scheduled for a robotic unilateral salpingo-oophorectomy, total laparoscopic hysterectomy, possible exploratory laparotomy, possible staging, possible bilateral salpingo-oophorectomy.  We will be checking labs today in the office: CBC, Cmet, CA 19-9, Lipase.  Pre-operative Testing -You will receive a phone call from presurgical testing at [facility] to arrange for a pre-operative testing appointment before your surgery.  This appointment normally occurs one to two weeks before your scheduled surgery.   -Bring your insurance card, copy of an advanced directive if applicable, medication list  -At that visit, you will be asked to sign a consent for a possible blood transfusion in case a transfusion becomes necessary during surgery.  The need for a blood transfusion is rare but having consent is a necessary part of your care.     -You should not be taking blood thinners or aspirin at least ten days prior to surgery unless instructed by your surgeon.  Day Before Surgery at [place] will be asked to take in a light diet the day before surgery.  Avoid carbonated beverages.  You will be advised to have nothing to eat or drink after midnight the evening before.    Eat a light diet the day before surgery.  Examples including soups, broths, toast, yogurt, mashed potatoes.  Things to avoid include carbonated beverages (fizzy beverages), raw fruits and raw vegetables, or beans.   If your bowels are filled with gas, your surgeon will have difficulty visualizing your pelvic organs which increases your surgical risks.  Your role in recovery Your role is to become active as soon as directed by your doctor, while still giving yourself time to heal.  Rest when you feel tired. You will be asked to do the following in order to speed your recovery:  - Cough and breathe  deeply. This helps toclear and expand your lungs and can prevent pneumonia. You may be given a spirometer to practice deep breathing. A staff member will show you how to use the spirometer. - Do mild physical activity. Walking or moving your legs help your circulation and body functions return to normal. A staff member will help you when you try to walk and will provide you with simple exercises. Do not try to get up or walk alone the first time. - Actively manage your pain. Managing your pain lets you move in comfort. We will ask you to rate your pain on a scale of zero to 10. It is your responsibility to tell your doctor or nurse where and how much you hurt so your pain can be treated.  Special Considerations -If you are diabetic, you may be placed on insulin after surgery to have closer control over your blood sugars to promote healing and recovery.  This does not mean that you will be discharged on insulin.  If applicable, your oral antidiabetics will be resumed when you are tolerating a solid diet.  -Your final pathology results from surgery should be available by the Friday after surgery and the results will be relayed to you when available.  -Dr. Lahoma Crocker is the Surgeon that assists your GYN Oncologist with surgery.  The next day after your surgery you will either see your GYN Oncologist or Dr. Lahoma Crocker.   Blood Transfusion Information WHAT IS A BLOOD TRANSFUSION? A transfusion is the replacement of blood or some of its parts. Blood is made  up of multiple cells which provide different functions.  Red blood cells carry oxygen and are used for blood loss replacement.  White blood cells fight against infection.  Platelets control bleeding.  Plasma helps clot blood.  Other blood products are available for specialized needs, such as hemophilia or other clotting disorders. BEFORE THE TRANSFUSION  Who gives blood for transfusions?   You may be able to donate blood to  be used at a later date on yourself (autologous donation).  Relatives can be asked to donate blood. This is generally not any safer than if you have received blood from a stranger. The same precautions are taken to ensure safety when a relative's blood is donated.  Healthy volunteers who are fully evaluated to make sure their blood is safe. This is blood bank blood. Transfusion therapy is the safest it has ever been in the practice of medicine. Before blood is taken from a donor, a complete history is taken to make sure that person has no history of diseases nor engages in risky social behavior (examples are intravenous drug use or sexual activity with multiple partners). The donor's travel history is screened to minimize risk of transmitting infections, such as malaria. The donated blood is tested for signs of infectious diseases, such as HIV and hepatitis. The blood is then tested to be sure it is compatible with you in order to minimize the chance of a transfusion reaction. If you or a relative donates blood, this is often done in anticipation of surgery and is not appropriate for emergency situations. It takes many days to process the donated blood. RISKS AND COMPLICATIONS Although transfusion therapy is very safe and saves many lives, the main dangers of transfusion include:   Getting an infectious disease.  Developing a transfusion reaction. This is an allergic reaction to something in the blood you were given. Every precaution is taken to prevent this. The decision to have a blood transfusion has been considered carefully by your caregiver before blood is given. Blood is not given unless the benefits outweigh the risks.

## 2017-08-25 ENCOUNTER — Encounter: Payer: Self-pay | Admitting: Obstetrics

## 2017-08-25 LAB — CANCER ANTIGEN 19-9: CA 19-9: 14 U/mL (ref 0–35)

## 2017-08-26 ENCOUNTER — Other Ambulatory Visit: Payer: Self-pay

## 2017-08-26 ENCOUNTER — Encounter (HOSPITAL_COMMUNITY)
Admission: RE | Admit: 2017-08-26 | Discharge: 2017-08-26 | Disposition: A | Discharge status: Home / Self Care | Payer: BLUE CROSS/BLUE SHIELD | Source: Ambulatory Visit | Attending: Obstetrics | Admitting: Obstetrics

## 2017-08-26 ENCOUNTER — Encounter (HOSPITAL_COMMUNITY): Payer: Self-pay

## 2017-08-26 DIAGNOSIS — Z01812 Encounter for preprocedural laboratory examination: Secondary | ICD-10-CM | POA: Diagnosis not present

## 2017-08-26 LAB — URINALYSIS, ROUTINE W REFLEX MICROSCOPIC
Bacteria, UA: NONE SEEN
Bilirubin Urine: NEGATIVE
Glucose, UA: NEGATIVE mg/dL
Ketones, ur: 20 mg/dL — AB
Leukocytes, UA: NEGATIVE
Nitrite: NEGATIVE
Protein, ur: NEGATIVE mg/dL
Specific Gravity, Urine: 1.011 (ref 1.005–1.030)
pH: 6 (ref 5.0–8.0)

## 2017-08-26 LAB — ABO/RH: ABO/RH(D): A POS

## 2017-08-26 LAB — PREGNANCY, URINE: Preg Test, Ur: NEGATIVE

## 2017-08-26 NOTE — Progress Notes (Signed)
08-26-17 UA result sent to Dr. Gerarda Fraction for review

## 2017-08-26 NOTE — Progress Notes (Signed)
Dr Alden Benjamin note 07-27-17 epic Labs cbc witj dif and cbmet epic done 08-24-17

## 2017-08-29 ENCOUNTER — Other Ambulatory Visit: Payer: Self-pay | Admitting: Gynecologic Oncology

## 2017-08-30 ENCOUNTER — Ambulatory Visit (HOSPITAL_COMMUNITY)
Admission: RE | Admit: 2017-08-30 | Discharge: 2017-08-31 | Disposition: A | Discharge status: Home / Self Care | Payer: BLUE CROSS/BLUE SHIELD | Source: Other Acute Inpatient Hospital | Attending: Obstetrics | Admitting: Obstetrics

## 2017-08-30 ENCOUNTER — Ambulatory Visit (HOSPITAL_COMMUNITY): Payer: BLUE CROSS/BLUE SHIELD | Admitting: Certified Registered Nurse Anesthetist

## 2017-08-30 ENCOUNTER — Encounter (HOSPITAL_COMMUNITY)
Admission: RE | Disposition: A | Discharge status: Home / Self Care | Payer: Self-pay | Source: Other Acute Inpatient Hospital | Attending: Obstetrics

## 2017-08-30 ENCOUNTER — Other Ambulatory Visit: Payer: Self-pay

## 2017-08-30 ENCOUNTER — Encounter (HOSPITAL_COMMUNITY): Payer: Self-pay

## 2017-08-30 DIAGNOSIS — N939 Abnormal uterine and vaginal bleeding, unspecified: Secondary | ICD-10-CM | POA: Diagnosis not present

## 2017-08-30 DIAGNOSIS — M1711 Unilateral primary osteoarthritis, right knee: Secondary | ICD-10-CM | POA: Diagnosis not present

## 2017-08-30 DIAGNOSIS — Z885 Allergy status to narcotic agent status: Secondary | ICD-10-CM | POA: Diagnosis not present

## 2017-08-30 DIAGNOSIS — C786 Secondary malignant neoplasm of retroperitoneum and peritoneum: Secondary | ICD-10-CM | POA: Diagnosis not present

## 2017-08-30 DIAGNOSIS — C7961 Secondary malignant neoplasm of right ovary: Secondary | ICD-10-CM

## 2017-08-30 DIAGNOSIS — Z8614 Personal history of Methicillin resistant Staphylococcus aureus infection: Secondary | ICD-10-CM | POA: Insufficient documentation

## 2017-08-30 DIAGNOSIS — R8569 Abnormal cytological findings in specimens from other digestive organs and abdominal cavity: Secondary | ICD-10-CM | POA: Diagnosis not present

## 2017-08-30 DIAGNOSIS — Z85038 Personal history of other malignant neoplasm of large intestine: Secondary | ICD-10-CM | POA: Insufficient documentation

## 2017-08-30 DIAGNOSIS — Z882 Allergy status to sulfonamides status: Secondary | ICD-10-CM | POA: Diagnosis not present

## 2017-08-30 DIAGNOSIS — Z9049 Acquired absence of other specified parts of digestive tract: Secondary | ICD-10-CM | POA: Insufficient documentation

## 2017-08-30 DIAGNOSIS — K660 Peritoneal adhesions (postprocedural) (postinfection): Secondary | ICD-10-CM | POA: Diagnosis not present

## 2017-08-30 DIAGNOSIS — Z880 Allergy status to penicillin: Secondary | ICD-10-CM | POA: Insufficient documentation

## 2017-08-30 DIAGNOSIS — Z6841 Body Mass Index (BMI) 40.0 and over, adult: Secondary | ICD-10-CM | POA: Diagnosis not present

## 2017-08-30 DIAGNOSIS — C7960 Secondary malignant neoplasm of unspecified ovary: Secondary | ICD-10-CM

## 2017-08-30 DIAGNOSIS — Z79899 Other long term (current) drug therapy: Secondary | ICD-10-CM | POA: Insufficient documentation

## 2017-08-30 DIAGNOSIS — M1712 Unilateral primary osteoarthritis, left knee: Secondary | ICD-10-CM | POA: Diagnosis not present

## 2017-08-30 DIAGNOSIS — K869 Disease of pancreas, unspecified: Secondary | ICD-10-CM | POA: Insufficient documentation

## 2017-08-30 DIAGNOSIS — R1900 Intra-abdominal and pelvic swelling, mass and lump, unspecified site: Secondary | ICD-10-CM

## 2017-08-30 DIAGNOSIS — M069 Rheumatoid arthritis, unspecified: Secondary | ICD-10-CM | POA: Diagnosis not present

## 2017-08-30 DIAGNOSIS — Z87891 Personal history of nicotine dependence: Secondary | ICD-10-CM | POA: Insufficient documentation

## 2017-08-30 DIAGNOSIS — C541 Malignant neoplasm of endometrium: Secondary | ICD-10-CM | POA: Insufficient documentation

## 2017-08-30 DIAGNOSIS — C561 Malignant neoplasm of right ovary: Secondary | ICD-10-CM | POA: Diagnosis not present

## 2017-08-30 DIAGNOSIS — Z22322 Carrier or suspected carrier of Methicillin resistant Staphylococcus aureus: Secondary | ICD-10-CM | POA: Diagnosis not present

## 2017-08-30 HISTORY — PX: ROBOTIC ASSISTED TOTAL HYSTERECTOMY WITH BILATERAL SALPINGO OOPHERECTOMY: SHX6086

## 2017-08-30 HISTORY — DX: Other seasonal allergic rhinitis: J30.2

## 2017-08-30 HISTORY — DX: Calculus of kidney: N20.0

## 2017-08-30 HISTORY — DX: Personal history of Methicillin resistant Staphylococcus aureus infection: Z86.14

## 2017-08-30 HISTORY — DX: Unspecified osteoarthritis, unspecified site: M19.90

## 2017-08-30 LAB — TYPE AND SCREEN
ABO/RH(D): A POS
Antibody Screen: NEGATIVE

## 2017-08-30 SURGERY — HYSTERECTOMY, TOTAL, ROBOT-ASSISTED, LAPAROSCOPIC, WITH BILATERAL SALPINGO-OOPHORECTOMY
Anesthesia: General

## 2017-08-30 MED ORDER — ONDANSETRON HCL 4 MG/2ML IJ SOLN
INTRAMUSCULAR | Status: DC | PRN
  Administered 2017-08-30 (×2): 4 mg via INTRAVENOUS

## 2017-08-30 MED ORDER — FENTANYL CITRATE (PF) 100 MCG/2ML IJ SOLN
INTRAMUSCULAR | Status: AC
  Filled 2017-08-30: qty 2

## 2017-08-30 MED ORDER — ROCURONIUM BROMIDE 100 MG/10ML IV SOLN
INTRAVENOUS | Status: AC
  Filled 2017-08-30: qty 1

## 2017-08-30 MED ORDER — STERILE WATER FOR IRRIGATION IR SOLN
Status: DC | PRN
  Administered 2017-08-30: 1000 mL

## 2017-08-30 MED ORDER — KETOROLAC TROMETHAMINE 30 MG/ML IJ SOLN
INTRAMUSCULAR | Status: DC | PRN
  Administered 2017-08-30: 30 mg via INTRAVENOUS

## 2017-08-30 MED ORDER — KETOROLAC TROMETHAMINE 30 MG/ML IJ SOLN
INTRAMUSCULAR | Status: AC
  Filled 2017-08-30: qty 1

## 2017-08-30 MED ORDER — MIDAZOLAM HCL 2 MG/2ML IJ SOLN
INTRAMUSCULAR | Status: AC
  Filled 2017-08-30: qty 2

## 2017-08-30 MED ORDER — ONDANSETRON HCL 4 MG/2ML IJ SOLN
INTRAMUSCULAR | Status: AC
  Filled 2017-08-30: qty 2

## 2017-08-30 MED ORDER — IBUPROFEN 200 MG PO TABS: 600.0000 mg | ORAL_TABLET | Freq: Four times a day (QID) | ORAL | Status: DC

## 2017-08-30 MED ORDER — SUGAMMADEX SODIUM 200 MG/2ML IV SOLN
INTRAVENOUS | Status: DC | PRN
  Administered 2017-08-30: 200 mg via INTRAVENOUS

## 2017-08-30 MED ORDER — FENTANYL CITRATE (PF) 100 MCG/2ML IJ SOLN
25.0000 ug | INTRAMUSCULAR | Status: DC | PRN
  Administered 2017-08-30 (×4): 50 ug via INTRAVENOUS

## 2017-08-30 MED ORDER — PHENYLEPHRINE 40 MCG/ML (10ML) SYRINGE FOR IV PUSH (FOR BLOOD PRESSURE SUPPORT)
PREFILLED_SYRINGE | INTRAVENOUS | Status: DC | PRN
  Administered 2017-08-30: 80 ug via INTRAVENOUS

## 2017-08-30 MED ORDER — ACETAMINOPHEN 500 MG PO TABS
1000.0000 mg | ORAL_TABLET | Freq: Four times a day (QID) | ORAL | Status: DC
  Administered 2017-08-30 – 2017-08-31 (×3): 1000 mg via ORAL
  Filled 2017-08-30 (×3): qty 2

## 2017-08-30 MED ORDER — DEXAMETHASONE SODIUM PHOSPHATE 10 MG/ML IJ SOLN
INTRAMUSCULAR | Status: DC | PRN
  Administered 2017-08-30: 10 mg via INTRAVENOUS

## 2017-08-30 MED ORDER — FENTANYL CITRATE (PF) 250 MCG/5ML IJ SOLN
INTRAMUSCULAR | Status: AC
  Filled 2017-08-30: qty 5

## 2017-08-30 MED ORDER — SUGAMMADEX SODIUM 200 MG/2ML IV SOLN
INTRAVENOUS | Status: AC
  Filled 2017-08-30: qty 2

## 2017-08-30 MED ORDER — LACTATED RINGERS IR SOLN
Status: DC | PRN
  Administered 2017-08-30: 1

## 2017-08-30 MED ORDER — ACETAMINOPHEN 500 MG PO TABS
1000.0000 mg | ORAL_TABLET | ORAL | Status: AC
  Administered 2017-08-30: 1000 mg via ORAL
  Filled 2017-08-30: qty 2

## 2017-08-30 MED ORDER — DOCUSATE SODIUM 100 MG PO CAPS
100.0000 mg | ORAL_CAPSULE | Freq: Two times a day (BID) | ORAL | Status: DC
  Administered 2017-08-30 – 2017-08-31 (×2): 100 mg via ORAL
  Filled 2017-08-30 (×2): qty 1

## 2017-08-30 MED ORDER — METOCLOPRAMIDE HCL 5 MG/ML IJ SOLN: 10.0000 mg | Freq: Once | INTRAMUSCULAR | Status: DC | PRN

## 2017-08-30 MED ORDER — ENOXAPARIN SODIUM 40 MG/0.4ML ~~LOC~~ SOLN
40.0000 mg | SUBCUTANEOUS | Status: DC
  Administered 2017-08-31: 40 mg via SUBCUTANEOUS
  Filled 2017-08-30: qty 0.4

## 2017-08-30 MED ORDER — FENTANYL CITRATE (PF) 100 MCG/2ML IJ SOLN
INTRAMUSCULAR | Status: DC | PRN
  Administered 2017-08-30 (×5): 50 ug via INTRAVENOUS
  Administered 2017-08-30: 100 ug via INTRAVENOUS

## 2017-08-30 MED ORDER — PROPOFOL 10 MG/ML IV BOLUS
INTRAVENOUS | Status: DC | PRN
  Administered 2017-08-30: 200 mg via INTRAVENOUS

## 2017-08-30 MED ORDER — HYDROCORTISONE NA SUCCINATE PF 100 MG IJ SOLR
INTRAMUSCULAR | Status: AC
  Filled 2017-08-30: qty 2

## 2017-08-30 MED ORDER — ONDANSETRON HCL 4 MG PO TABS: 4.0000 mg | ORAL_TABLET | Freq: Four times a day (QID) | ORAL | Status: DC | PRN

## 2017-08-30 MED ORDER — SCOPOLAMINE 1 MG/3DAYS TD PT72
1.0000 | MEDICATED_PATCH | TRANSDERMAL | Status: DC
  Administered 2017-08-30: 1.5 mg via TRANSDERMAL
  Filled 2017-08-30: qty 1

## 2017-08-30 MED ORDER — LIDOCAINE 2% (20 MG/ML) 5 ML SYRINGE
INTRAMUSCULAR | Status: DC | PRN
  Administered 2017-08-30: 100 mg via INTRAVENOUS

## 2017-08-30 MED ORDER — OXYCODONE HCL 5 MG PO TABS
5.0000 mg | ORAL_TABLET | ORAL | Status: DC | PRN
  Administered 2017-08-30 – 2017-08-31 (×4): 5 mg via ORAL
  Filled 2017-08-30 (×4): qty 1

## 2017-08-30 MED ORDER — PHENYLEPHRINE 40 MCG/ML (10ML) SYRINGE FOR IV PUSH (FOR BLOOD PRESSURE SUPPORT)
PREFILLED_SYRINGE | INTRAVENOUS | Status: AC
  Filled 2017-08-30: qty 10

## 2017-08-30 MED ORDER — PREDNISOLONE ACETATE 1 % OP SUSP
1.0000 [drp] | OPHTHALMIC | Status: DC | PRN
  Filled 2017-08-30: qty 5

## 2017-08-30 MED ORDER — MEPERIDINE HCL 50 MG/ML IJ SOLN: 6.2500 mg | INTRAMUSCULAR | Status: DC | PRN

## 2017-08-30 MED ORDER — SODIUM CHLORIDE 0.9 % IV SOLN
2.0000 g | INTRAVENOUS | Status: AC
  Administered 2017-08-30 (×2): 2 g via INTRAVENOUS
  Filled 2017-08-30: qty 2

## 2017-08-30 MED ORDER — SODIUM CHLORIDE 0.9 % IV SOLN
2.0000 g | Freq: Once | INTRAVENOUS | Status: DC
  Filled 2017-08-30: qty 2

## 2017-08-30 MED ORDER — LIDOCAINE 2% (20 MG/ML) 5 ML SYRINGE
INTRAMUSCULAR | Status: AC
Start: 2017-08-30 — End: ?
  Filled 2017-08-30: qty 5

## 2017-08-30 MED ORDER — KETOROLAC TROMETHAMINE 30 MG/ML IJ SOLN: 15.0000 mg | Freq: Four times a day (QID) | INTRAMUSCULAR | Status: DC

## 2017-08-30 MED ORDER — PREDNISONE 10 MG PO TABS
10.0000 mg | ORAL_TABLET | Freq: Every day | ORAL | Status: DC
  Administered 2017-08-31: 10 mg via ORAL
  Filled 2017-08-30: qty 1

## 2017-08-30 MED ORDER — LACTATED RINGERS IV SOLN: INTRAVENOUS | Status: DC

## 2017-08-30 MED ORDER — DEXAMETHASONE SODIUM PHOSPHATE 4 MG/ML IJ SOLN: 4.0000 mg | INTRAMUSCULAR | Status: DC

## 2017-08-30 MED ORDER — GABAPENTIN 300 MG PO CAPS
300.0000 mg | ORAL_CAPSULE | ORAL | Status: AC
  Administered 2017-08-30: 300 mg via ORAL
  Filled 2017-08-30: qty 1

## 2017-08-30 MED ORDER — ROCURONIUM BROMIDE 50 MG/5ML IV SOSY
PREFILLED_SYRINGE | INTRAVENOUS | Status: DC | PRN
  Administered 2017-08-30: 20 mg via INTRAVENOUS
  Administered 2017-08-30: 100 mg via INTRAVENOUS

## 2017-08-30 MED ORDER — LACTATED RINGERS IV SOLN
INTRAVENOUS | Status: DC | PRN
  Administered 2017-08-30 (×2): via INTRAVENOUS

## 2017-08-30 MED ORDER — FENTANYL CITRATE (PF) 100 MCG/2ML IJ SOLN
INTRAMUSCULAR | Status: AC
  Filled 2017-08-30: qty 4

## 2017-08-30 MED ORDER — MIDAZOLAM HCL 2 MG/2ML IJ SOLN
INTRAMUSCULAR | Status: DC | PRN
  Administered 2017-08-30: 2 mg via INTRAVENOUS

## 2017-08-30 MED ORDER — HYDROMORPHONE HCL 1 MG/ML IJ SOLN
0.2000 mg | INTRAMUSCULAR | Status: DC | PRN
  Administered 2017-08-30: 0.6 mg via INTRAVENOUS
  Filled 2017-08-30: qty 1

## 2017-08-30 MED ORDER — GABAPENTIN 300 MG PO CAPS
600.0000 mg | ORAL_CAPSULE | Freq: Every day | ORAL | Status: AC
  Administered 2017-08-30: 600 mg via ORAL
  Filled 2017-08-30: qty 2

## 2017-08-30 MED ORDER — DEXAMETHASONE SODIUM PHOSPHATE 10 MG/ML IJ SOLN
INTRAMUSCULAR | Status: AC
  Filled 2017-08-30: qty 1

## 2017-08-30 MED ORDER — PANTOPRAZOLE SODIUM 40 MG PO TBEC
40.0000 mg | DELAYED_RELEASE_TABLET | Freq: Every day | ORAL | Status: DC
  Administered 2017-08-31: 40 mg via ORAL
  Filled 2017-08-30: qty 1

## 2017-08-30 MED ORDER — PROPOFOL 10 MG/ML IV BOLUS
INTRAVENOUS | Status: AC
Start: 2017-08-30 — End: ?
  Filled 2017-08-30: qty 40

## 2017-08-30 MED ORDER — ONDANSETRON HCL 4 MG/2ML IJ SOLN: 4.0000 mg | Freq: Four times a day (QID) | INTRAMUSCULAR | Status: DC | PRN

## 2017-08-30 MED ORDER — KCL IN DEXTROSE-NACL 20-5-0.45 MEQ/L-%-% IV SOLN
INTRAVENOUS | Status: DC
  Administered 2017-08-30: 13:00:00 via INTRAVENOUS
  Administered 2017-08-31: 1000 mL via INTRAVENOUS
  Filled 2017-08-30 (×2): qty 1000

## 2017-08-30 MED ORDER — HYDROCORTISONE NA SUCCINATE PF 100 MG IJ SOLR
INTRAMUSCULAR | Status: DC | PRN
  Administered 2017-08-30: 50 mg via INTRAVENOUS

## 2017-08-30 SURGICAL SUPPLY — 108 items
ADH SKN CLS APL DERMABOND .7 (GAUZE/BANDAGES/DRESSINGS) ×1
APL SKNCLS STERI-STRIP NONHPOA (GAUZE/BANDAGES/DRESSINGS) ×2
APL SWBSTK 6 STRL LF DISP (MISCELLANEOUS) ×1
APPLICATOR COTTON TIP 6 STRL (MISCELLANEOUS) ×2 IMPLANT
APPLICATOR COTTON TIP 6IN STRL (MISCELLANEOUS) ×3
APPLICATOR SURGIFLO ENDO (HEMOSTASIS) IMPLANT
ATTRACTOMAT 16X20 MAGNETIC DRP (DRAPES) ×3 IMPLANT
BAG LAPAROSCOPIC 12 15 PORT 16 (BASKET) ×2 IMPLANT
BAG RETRIEVAL 12/15 (BASKET) ×3
BENZOIN TINCTURE PRP APPL 2/3 (GAUZE/BANDAGES/DRESSINGS) ×3 IMPLANT
BLADE EXTENDED COATED 6.5IN (ELECTRODE) ×3 IMPLANT
CHLORAPREP W/TINT 26ML (MISCELLANEOUS) ×3 IMPLANT
CLIP VESOCCLUDE LG 6/CT (CLIP) ×3 IMPLANT
CLIP VESOCCLUDE MED 6/CT (CLIP) ×3 IMPLANT
CONT SPEC 4OZ CLIKSEAL STRL BL (MISCELLANEOUS) IMPLANT
COVER BACK TABLE 60X90IN (DRAPES) ×3 IMPLANT
COVER TIP SHEARS 8 DVNC (MISCELLANEOUS) ×2 IMPLANT
COVER TIP SHEARS 8MM DA VINCI (MISCELLANEOUS) ×1
DERMABOND ADVANCED (GAUZE/BANDAGES/DRESSINGS) ×1
DERMABOND ADVANCED .7 DNX12 (GAUZE/BANDAGES/DRESSINGS) ×2 IMPLANT
DRAPE ARM DVNC X/XI (DISPOSABLE) ×8 IMPLANT
DRAPE COLUMN DVNC XI (DISPOSABLE) ×2 IMPLANT
DRAPE DA VINCI XI ARM (DISPOSABLE) ×4
DRAPE DA VINCI XI COLUMN (DISPOSABLE) ×1
DRAPE INCISE IOBAN 66X45 STRL (DRAPES) IMPLANT
DRAPE SHEET LG 3/4 BI-LAMINATE (DRAPES) ×3 IMPLANT
DRAPE SURG IRRIG POUCH 19X23 (DRAPES) ×3 IMPLANT
DRAPE UNDERBUTTOCKS STRL (DRAPE) ×3 IMPLANT
DRAPE WARM FLUID 44X44 (DRAPE) ×3 IMPLANT
ELECT REM PT RETURN 15FT ADLT (MISCELLANEOUS) ×3 IMPLANT
GAUZE 4X4 16PLY RFD (DISPOSABLE) IMPLANT
GAUZE SPONGE 4X4 12PLY STRL (GAUZE/BANDAGES/DRESSINGS) ×3 IMPLANT
GLOVE BIO SURGEON STRL SZ 6 (GLOVE) IMPLANT
GLOVE BIO SURGEON STRL SZ 6.5 (GLOVE) IMPLANT
GLOVE BIOGEL PI IND STRL 7.0 (GLOVE) ×6 IMPLANT
GLOVE BIOGEL PI INDICATOR 7.0 (GLOVE) ×3
GLOVE SURG SS PI 6.5 STRL IVOR (GLOVE) ×9 IMPLANT
GOWN STRL REUS W/ TWL LRG LVL3 (GOWN DISPOSABLE) ×2 IMPLANT
GOWN STRL REUS W/TWL LRG LVL3 (GOWN DISPOSABLE) ×4 IMPLANT
GOWN STRL REUS W/TWL XL LVL3 (GOWN DISPOSABLE) ×6 IMPLANT
GYRUS RUMI II 2.5CM BLUE (DISPOSABLE)
GYRUS RUMI II 3.5CM BLUE (DISPOSABLE) ×3
HANDLE SUCTION POOLE (INSTRUMENTS) IMPLANT
HOLDER FOLEY CATH W/STRAP (MISCELLANEOUS) ×3 IMPLANT
IRRIG SUCT STRYKERFLOW 2 WTIP (MISCELLANEOUS) ×3
IRRIGATION SUCT STRKRFLW 2 WTP (MISCELLANEOUS) ×2 IMPLANT
KIT BASIN OR (CUSTOM PROCEDURE TRAY) ×3 IMPLANT
KIT PROCEDURE DA VINCI SI (MISCELLANEOUS)
KIT PROCEDURE DVNC SI (MISCELLANEOUS) IMPLANT
LIGASURE IMPACT 36 18CM CVD LR (INSTRUMENTS) ×3 IMPLANT
MANIPULATOR UTERINE 4.5 ZUMI (MISCELLANEOUS) IMPLANT
NEEDLE HYPO 22GX1.5 SAFETY (NEEDLE) ×6 IMPLANT
NEEDLE SPNL 18GX3.5 QUINCKE PK (NEEDLE) IMPLANT
NS IRRIG 1000ML POUR BTL (IV SOLUTION) ×6 IMPLANT
OBTURATOR OPTICAL STANDARD 8MM (TROCAR) ×1
OBTURATOR OPTICAL STND 8 DVNC (TROCAR) ×2
OBTURATOR OPTICALSTD 8 DVNC (TROCAR) ×2 IMPLANT
PACK GENERAL/GYN (CUSTOM PROCEDURE TRAY) ×3 IMPLANT
PACK ROBOT GYN CUSTOM WL (TRAY / TRAY PROCEDURE) ×3 IMPLANT
PAD POSITIONING PINK XL (MISCELLANEOUS) ×3 IMPLANT
POUCH SPECIMEN RETRIEVAL 10MM (ENDOMECHANICALS) IMPLANT
RETAINER VISCERA MED (MISCELLANEOUS) IMPLANT
RUMI II 3.0CM BLUE KOH-EFFICIE (DISPOSABLE) IMPLANT
RUMI II GYRUS 2.5CM BLUE (DISPOSABLE) IMPLANT
RUMI II GYRUS 3.5CM BLUE (DISPOSABLE) ×2 IMPLANT
SCISSORS LAP 5X35 DISP (ENDOMECHANICALS) ×3 IMPLANT
SEAL CANN UNIV 5-8 DVNC XI (MISCELLANEOUS) ×8 IMPLANT
SEAL XI 5MM-8MM UNIVERSAL (MISCELLANEOUS) ×4
SEPRAFILM MEMBRANE 5X6 (MISCELLANEOUS) IMPLANT
SET TRI-LUMEN FLTR TB AIRSEAL (TUBING) ×3 IMPLANT
SHEET LAVH (DRAPES) ×3 IMPLANT
SPONGE LAP 18X18 RF (DISPOSABLE) IMPLANT
STRIP CLOSURE SKIN 1/2X4 (GAUZE/BANDAGES/DRESSINGS) ×3 IMPLANT
SUCTION POOLE HANDLE (INSTRUMENTS)
SURGIFLO W/THROMBIN 8M KIT (HEMOSTASIS) IMPLANT
SUT MNCRL AB 4-0 PS2 18 (SUTURE) ×12 IMPLANT
SUT PDS AB 1 TP1 96 (SUTURE) ×6 IMPLANT
SUT PLAIN 2 0 XLH (SUTURE) IMPLANT
SUT SILK 2 0 (SUTURE)
SUT SILK 2-0 18XBRD TIE 12 (SUTURE) IMPLANT
SUT VIC AB 0 CT1 18XCR BRD 8 (SUTURE) ×2 IMPLANT
SUT VIC AB 0 CT1 27 (SUTURE)
SUT VIC AB 0 CT1 27XBRD ANTBC (SUTURE) IMPLANT
SUT VIC AB 0 CT1 36 (SUTURE) ×18 IMPLANT
SUT VIC AB 0 CT1 8-18 (SUTURE) ×1
SUT VIC AB 4-0 P2 18 (SUTURE) ×6 IMPLANT
SUT VIC AB 4-0 PS2 18 (SUTURE) ×6 IMPLANT
SUT VIC AB 4-0 RB1 27 (SUTURE) ×1
SUT VIC AB 4-0 RB1 27XBRD (SUTURE) ×2 IMPLANT
SUT VICRYL 0 TIES 12 18 (SUTURE) ×3 IMPLANT
SUT VLOC 180 0 9IN  GS21 (SUTURE) ×2
SUT VLOC 180 0 9IN GS21 (SUTURE) ×4 IMPLANT
SYR 10ML LL (SYRINGE) IMPLANT
SYR 30ML LL (SYRINGE) ×6 IMPLANT
SYS RETRIEVAL 5MM INZII UNIV (BASKET) ×3
SYSTEM RETRIEVL 5MM INZII UNIV (BASKET) ×2 IMPLANT
TIP RUMI ORANGE 6.7MMX12CM (TIP) IMPLANT
TIP UTERINE 5.1X6CM LAV DISP (MISCELLANEOUS) IMPLANT
TIP UTERINE 6.7X10CM GRN DISP (MISCELLANEOUS) IMPLANT
TIP UTERINE 6.7X6CM WHT DISP (MISCELLANEOUS) IMPLANT
TIP UTERINE 6.7X8CM BLUE DISP (MISCELLANEOUS) ×3 IMPLANT
TOWEL OR 17X26 10 PK STRL BLUE (TOWEL DISPOSABLE) ×3 IMPLANT
TOWEL OR NON WOVEN STRL DISP B (DISPOSABLE) ×3 IMPLANT
TRAP SPECIMEN MUCOUS 40CC (MISCELLANEOUS) ×3 IMPLANT
TRAY FOLEY MTR SLVR 14FR STAT (SET/KITS/TRAYS/PACK) ×3 IMPLANT
TRAY FOLEY MTR SLVR 16FR STAT (SET/KITS/TRAYS/PACK) ×3 IMPLANT
UNDERPAD 30X30 (UNDERPADS AND DIAPERS) ×3 IMPLANT
WATER STERILE IRR 1000ML POUR (IV SOLUTION) ×3 IMPLANT

## 2017-08-30 NOTE — Anesthesia Procedure Notes (Signed)
Procedure Name: Intubation Date/Time: 08/30/2017 7:43 AM Performed by: Genelle Bal, CRNA Pre-anesthesia Checklist: Patient identified, Emergency Drugs available, Suction available and Patient being monitored Patient Re-evaluated:Patient Re-evaluated prior to induction Oxygen Delivery Method: Circle system utilized Preoxygenation: Pre-oxygenation with 100% oxygen Induction Type: IV induction Ventilation: Mask ventilation without difficulty Laryngoscope Size: Miller and 2 Grade View: Grade I Tube type: Oral Tube size: 7.0 mm Number of attempts: 1 Airway Equipment and Method: Stylet and Oral airway Placement Confirmation: ETT inserted through vocal cords under direct vision,  positive ETCO2 and breath sounds checked- equal and bilateral Secured at: 20 cm Tube secured with: Tape Dental Injury: Teeth and Oropharynx as per pre-operative assessment  Comments: DL #1 Mil 2 by Paramedic student, grade IV view, DL #2 Mil 2 by S. Alford Highland CRNA, grade I view, ATOI, +BBS/etCO2 confirm.

## 2017-08-30 NOTE — Anesthesia Preprocedure Evaluation (Addendum)
Anesthesia Evaluation  Patient identified by MRN, date of birth, ID band Patient awake    Reviewed: Allergy & Precautions, NPO status , Patient's Chart, lab work & pertinent test results  Airway Mallampati: II  TM Distance: >3 FB Neck ROM: Full    Dental no notable dental hx.    Pulmonary neg pulmonary ROS, former smoker,    Pulmonary exam normal breath sounds clear to auscultation       Cardiovascular negative cardio ROS Normal cardiovascular exam Rhythm:Regular Rate:Normal     Neuro/Psych negative neurological ROS  negative psych ROS   GI/Hepatic negative GI ROS, Neg liver ROS,   Endo/Other  Morbid obesity  Renal/GU negative Renal ROS  negative genitourinary   Musculoskeletal  (+) Arthritis , Rheumatoid disorders and steroids,    Abdominal   Peds negative pediatric ROS (+)  Hematology negative hematology ROS (+)   Anesthesia Other Findings   Reproductive/Obstetrics negative OB ROS                            Anesthesia Physical Anesthesia Plan  ASA: III  Anesthesia Plan: General   Post-op Pain Management:    Induction: Intravenous  PONV Risk Score and Plan: 4 or greater and Ondansetron, Dexamethasone, Midazolam, Scopolamine patch - Pre-op and Treatment may vary due to age or medical condition  Airway Management Planned: Oral ETT  Additional Equipment:   Intra-op Plan:   Post-operative Plan: Extubation in OR  Informed Consent: I have reviewed the patients History and Physical, chart, labs and discussed the procedure including the risks, benefits and alternatives for the proposed anesthesia with the patient or authorized representative who has indicated his/her understanding and acceptance.   Dental advisory given  Plan Discussed with: CRNA  Anesthesia Plan Comments: (Needs steroid coverage. 50mg  hydrocortisone IV)       Anesthesia Quick Evaluation

## 2017-08-30 NOTE — Anesthesia Postprocedure Evaluation (Signed)
Anesthesia Post Note  Patient: Dana Hicks  Procedure(s) Performed: XI ROBOTIC ASSISTED TOTAL  HYSTERECTOMY BILATERAL  SALPINGO OOPHORECTOMY; LYSIS OF ADHESIONS (Bilateral )     Patient location during evaluation: PACU Anesthesia Type: General Level of consciousness: awake and alert Pain management: pain level controlled Vital Signs Assessment: post-procedure vital signs reviewed and stable Respiratory status: spontaneous breathing, nonlabored ventilation, respiratory function stable and patient connected to nasal cannula oxygen Cardiovascular status: blood pressure returned to baseline and stable Postop Assessment: no apparent nausea or vomiting Anesthetic complications: no    Last Vitals:  Vitals:   08/30/17 1224 08/30/17 1326  BP: (!) 148/79 125/86  Pulse: 61 68  Resp: 16 18  Temp: 36.7 C (!) 36.4 C  SpO2: 100%     Last Pain:  Vitals:   08/30/17 1326  TempSrc: Oral  PainSc:                  Montez Hageman

## 2017-08-30 NOTE — Transfer of Care (Signed)
Immediate Anesthesia Transfer of Care Note  Patient: Dana Hicks  Procedure(s) Performed: XI ROBOTIC ASSISTED TOTAL  HYSTERECTOMY BILATERAL  SALPINGO OOPHORECTOMY; LYSIS OF ADHESIONS (Bilateral )  Patient Location: PACU  Anesthesia Type:General  Level of Consciousness: awake, alert , oriented and patient cooperative  Airway & Oxygen Therapy: Patient Spontanous Breathing and Patient connected to face mask oxygen  Post-op Assessment: Report given to RN and Post -op Vital signs reviewed and stable  Post vital signs: Reviewed and stable  Last Vitals:  Vitals Value Taken Time  BP 127/94 08/30/2017 11:13 AM  Temp    Pulse 80 08/30/2017 11:14 AM  Resp 17 08/30/2017 11:14 AM  SpO2 100 % 08/30/2017 11:14 AM  Vitals shown include unvalidated device data.  Last Pain:  Vitals:   08/30/17 0621  TempSrc: Oral  PainSc: 0-No pain         Complications: No apparent anesthesia complications

## 2017-08-30 NOTE — Op Note (Signed)
OPERATIVE NOTE  Surgeon: Mart Piggs, MD  Assistant: Lahoma Crocker, MD (an MD assistant was necessary for tissue manipulation, management of robotic instrumentation, retraction and positioning due to the complexity of the case and hospital policies).   Pre-operative Diagnosis:  1. Adnexal mass 2. Abnormal uterine bleeding 3. H/o Cecal CA  Post-operative Diagnosis:  1. Adhesive disease post colon resection 2. Endometrial cancer NOS 3. Adenocarcinoma unknown origin, right ovary, suspicious for GI primary  Operation:  1. Lysis of adhesions ~30 minutes 2. Robotic-assisted laparoscopic total hysterectomy with right salpingo-oophorectomy and left salpingectomy 3. Left oophorectomy (RA-laparoscopic) 4. Pelvic washings  Anesthesia: General endotracheal anesthesia   Findings: Adhesions of omentum to anterior abdominal wall. Enlarged cystic right ovary ~10cm. Uterus had small nodules on serosa near where right adnexa was intimate with the surface. No obvious intraoperative rupture of cyst, although in 2 areas the wall was thin and one of these areas had some bleeding. Slight scarring of left bladder dome to LUS/cervix. Uterus on frozen section c/w hyperplasia and a small focus of endometrial CA - no myo invasion, <2cm in size. Frozen section on the right adnexa was carcinoma, met from colon or possibly Gyn, favor GI primary, defer to permanent. Left ovary was WNL.  Estimated Blood Loss:  less than 50 mL             Specimens: Washings, uterus with attached cervix and left fallopian tube, right tube/ovary, left ovary.          Complications:  None; patient tolerated the procedure well.         Disposition: PACU - hemodynamically stable.  Procedure Details  After induction of anesthesia the patient was placed in lithotomy position. Her arms were tucked to her side with all appropriate precautions.  The shoulders were stabilized with padded shoulder blocks applied to the acromium  processes.  The perineum was prepped with Betadine. CholoraPrep was used to prep the abdomen and allowed to dry for 3 minutes.  The patient was then draped.   A time out was performed. A Foley catheter was placed by me.  A sterile speculum was placed in the vagina.  The cervix was grasped with a single-tooth tenaculum. The cervix was dilated with Pratt dilators and sounded to 8cm.  The 8cm RUMI II uterine manipulator with a medium (3.5)colpotomizer ring was placed without difficulty.  OG tube placement was confirmed.   Next, a 10 mm skin incision was made 2 cm below the subcostal margin just medial to the midclavicular line.  The 5 mm Optiview port and scope was used for direct entry.  Opening pressure was under 10 mm CO2.  The abdomen was insufflated and the findings were noted as above.   At this point and all points during the procedure, the patient's intra-abdominal pressure did not exceed 15 mmHg. The patient was placed in steep Trendelenburg.  Next, an 64m skin incision was made on the left lateral side of the abdomen and lysis of adhesions was performed with the sharp scissors and monopolar cautery. ~30 minutes of LOA. Next an 833mincision was made at the umbilicus and then the 2 right ports sites were incised. The trocars were placed under direct visualization. The 17m18mptiview was changed out for a 61m69mrseal. All ports were placed under direct visualization.  The robot was docked in the normal manner.  The round ligaments on the right was elevated, coagulated, and transected. The right peritoneum was opened parallel to the IP  ligament to open the retroperitoneal space. The right ureter identified and the IP ligament above was isolated, clamped, coagulated, and transected. The broad ligament was taken down to the uterus. On the same side, the fallopian tube insertion site on the cornua, and the utero-ovarian ligament were isolated, clamped, coagulated, and transected. The specimen was now free. It  was placed into a 40m Endocatch bag.   Attention was turned to the hysterectomy. The left fallopian tube was separated off of the broad ligament and skeletonized towards the cornua. The left round ligament was elevated, cauterized, and transected and the left retroperitoneal space opened. The adnexa undermined and the uteroovarian ligament was isolated, clamped, coagulated and transected. The underlying broad ligament was taken down. The uterine vessels bilaterally were skeletonized. The posterior peritoneum was taken down to the level of the KOH ring.  The anterior peritoneum was also taken down.  The bladder flap was created to the level of the KOH ring.  On the left the bladder was a little adherent to the LUS/Cervix. The uterine arteries bilaterally were cauterized and transected in the normal manner.  The colpotomy was made and the uterus, cervix, and left fallopian tube were amputated and delivered through the vagina.  Pedicles were inspected and excellent hemostasis was achieved.    The uterus was sent for frozen section.  The Endocatch bag containing the right tube and ovary was partly delivered through the vagina and a gallbladder trocar was inserted into the mass for controlled drainage. Once sufficiently decompressed the bag was delivered and the specimen sent for frozen section.  Frozen section returned as noted. The left retroperitoneal space was further opened and the ureter on the left identified. The left IP ligament was then isolated, clamped, coagulated, and transected. The ovary was then free and it was placed into a 560mEndocatch bag. This was delivered through the vagina.  The colpotomy at the vaginal cuff was closed with two 9-inch V-loc sutures, starting at either end, meeting in the midline and traveling back for another 2 bites. The suture was cut and the needles removed through the 1234mirseal trocar. Irrigation was used and excellent hemostasis was achieved.    Robotic  instruments were removed under direct visualization.  The robot was undocked. The Airseal was shut down. The mmHg noted to go to 0. The trocars were removed. The 45m67mrt fascia was closed with a 0-vicryl in the usual fashion. The skin was closed with 4-0 Vicryl in the dermis. Then 4-0 monocryl was used in a subcuticular manner.  Dermabond was applied.    The balloon was removed from the vagina. No bleeding upon examination of the perineum and vaginal canal. Sponge, lap and needle counts correct x 2.  The patient was taken to the recovery room in stable condition.

## 2017-08-30 NOTE — Interval H&P Note (Signed)
History and Physical Interval Note:  08/30/2017 7:13 AM  Dana Hicks  has presented today for surgery, with the diagnosis of PELVIC MASS  The various methods of treatment have been discussed with the patient and family. After consideration of risks, benefits and other options for treatment, the patient has consented to  Procedure(s): XI ROBOTIC ASSISTED TOTAL  HYSTERECTOMY UNILATERAL  SALPINGO OOPHORECTOMY POSSIBLE BILATERAL (Bilateral) POSSIBLE EXPLORATORY LAPAROTOMY (N/A) POSSIBLE  STAGING (N/A) as a surgical intervention .  The patient's history has been reviewed, patient examined, no change in status, stable for surgery.  I have reviewed the patient's chart and labs.  Questions were answered to the patient's satisfaction.     Isabel Caprice

## 2017-08-31 ENCOUNTER — Encounter (HOSPITAL_COMMUNITY): Payer: Self-pay | Admitting: Obstetrics

## 2017-08-31 DIAGNOSIS — K869 Disease of pancreas, unspecified: Secondary | ICD-10-CM | POA: Diagnosis not present

## 2017-08-31 DIAGNOSIS — C541 Malignant neoplasm of endometrium: Secondary | ICD-10-CM

## 2017-08-31 DIAGNOSIS — Z6841 Body Mass Index (BMI) 40.0 and over, adult: Secondary | ICD-10-CM | POA: Diagnosis not present

## 2017-08-31 DIAGNOSIS — N939 Abnormal uterine and vaginal bleeding, unspecified: Secondary | ICD-10-CM | POA: Diagnosis not present

## 2017-08-31 DIAGNOSIS — Z9049 Acquired absence of other specified parts of digestive tract: Secondary | ICD-10-CM | POA: Diagnosis not present

## 2017-08-31 DIAGNOSIS — M069 Rheumatoid arthritis, unspecified: Secondary | ICD-10-CM | POA: Diagnosis not present

## 2017-08-31 DIAGNOSIS — Z885 Allergy status to narcotic agent status: Secondary | ICD-10-CM | POA: Diagnosis not present

## 2017-08-31 DIAGNOSIS — Z882 Allergy status to sulfonamides status: Secondary | ICD-10-CM | POA: Diagnosis not present

## 2017-08-31 DIAGNOSIS — Z880 Allergy status to penicillin: Secondary | ICD-10-CM | POA: Diagnosis not present

## 2017-08-31 DIAGNOSIS — C786 Secondary malignant neoplasm of retroperitoneum and peritoneum: Secondary | ICD-10-CM | POA: Diagnosis not present

## 2017-08-31 DIAGNOSIS — Z79899 Other long term (current) drug therapy: Secondary | ICD-10-CM | POA: Diagnosis not present

## 2017-08-31 DIAGNOSIS — Z8614 Personal history of Methicillin resistant Staphylococcus aureus infection: Secondary | ICD-10-CM | POA: Diagnosis not present

## 2017-08-31 DIAGNOSIS — Z85038 Personal history of other malignant neoplasm of large intestine: Secondary | ICD-10-CM | POA: Diagnosis not present

## 2017-08-31 DIAGNOSIS — Z87891 Personal history of nicotine dependence: Secondary | ICD-10-CM | POA: Diagnosis not present

## 2017-08-31 DIAGNOSIS — C7961 Secondary malignant neoplasm of right ovary: Secondary | ICD-10-CM

## 2017-08-31 LAB — BASIC METABOLIC PANEL
Anion gap: 8 (ref 5–15)
BUN: 7 mg/dL (ref 6–20)
CO2: 27 mmol/L (ref 22–32)
Calcium: 8.5 mg/dL — ABNORMAL LOW (ref 8.9–10.3)
Chloride: 106 mmol/L (ref 98–111)
Creatinine, Ser: 0.71 mg/dL (ref 0.44–1.00)
GFR calc Af Amer: >60 mL/min (ref >60)
GFR calc non Af Amer: >60 mL/min (ref >60)
Glucose, Bld: 121 mg/dL — ABNORMAL HIGH (ref 70–99)
Potassium: 3.9 mmol/L (ref 3.5–5.1)
Sodium: 141 mmol/L (ref 135–145)

## 2017-08-31 LAB — CBC
HCT: 33.1 % — ABNORMAL LOW (ref 36.0–46.0)
Hemoglobin: 10.2 g/dL — ABNORMAL LOW (ref 12.0–15.0)
MCH: 29 pg (ref 26.0–34.0)
MCHC: 30.8 g/dL (ref 30.0–36.0)
MCV: 94 fL (ref 78.0–100.0)
Platelets: 324 10*3/uL (ref 150–400)
RBC: 3.52 MIL/uL — ABNORMAL LOW (ref 3.87–5.11)
RDW: 15 % (ref 11.5–15.5)
WBC: 13 10*3/uL — ABNORMAL HIGH (ref 4.0–10.5)

## 2017-08-31 MED ORDER — OXYCODONE HCL 5 MG PO TABS: 5.0000 mg | ORAL_TABLET | ORAL | 0 refills | Status: DC | PRN

## 2017-08-31 MED ORDER — DOCUSATE SODIUM 100 MG PO CAPS: 100.0000 mg | ORAL_CAPSULE | Freq: Two times a day (BID) | ORAL | 0 refills | Status: DC

## 2017-08-31 MED ORDER — IBUPROFEN 600 MG PO TABS: 600.0000 mg | ORAL_TABLET | Freq: Four times a day (QID) | ORAL | 0 refills | Status: DC

## 2017-08-31 NOTE — Discharge Summary (Signed)
Physician Discharge Summary  Patient ID: Dana Hicks MRN: [id number] DOB/AGE: 08-13-1975 42 y.o.  Admit date: 08/30/2017 Discharge date: 08/31/2017  Admission Diagnoses: Secondary malignant neoplasm of right ovary [facility])  Discharge Diagnoses:  Principal Problem:   Secondary malignant neoplasm of right ovary [facility]) Active Problems:   Pelvic mass in female   Endometrial cancer [facility])   Discharged Condition: good  Hospital Course:  1/ patient was admitted on 08/30/17 for a robotic hysterectomy, BSO, lysis of adhesions for metastatic colon cancer to the right ovary, endometrial cancer. 2/ surgery was uncomplicated  3/ on postoperative day 1 the patient was meeting discharge criteria: tolerating PO, voiding urine, ambulating, pain well controlled on oral medications.  4/ new medications on discharge include percocet, colace, ibuprofen.  Consults: None  Significant Diagnostic Studies: labs:  CBC    Component Value Date/Time   WBC 13.0 (H) 08/31/2017 0428   RBC 3.52 (L) 08/31/2017 0428   HGB 10.2 (L) 08/31/2017 0428   HGB 11.9 08/24/2017 1118   HGB 13.0 05/15/2009 1616   HCT 33.1 (L) 08/31/2017 0428   HCT 38.0 05/15/2009 1616   PLT 324 08/31/2017 0428   PLT 317 08/24/2017 1118   PLT 215 05/15/2009 1616   MCV 94.0 08/31/2017 0428   MCV 92.7 05/15/2009 1616   MCH 29.0 08/31/2017 0428   MCHC 30.8 08/31/2017 0428   RDW 15.0 08/31/2017 0428   RDW 13.6 05/15/2009 1616   LYMPHSABS 2.7 08/24/2017 1118   LYMPHSABS 2.2 05/15/2009 1616   MONOABS 1.0 (H) 08/24/2017 1118   MONOABS 0.5 05/15/2009 1616   EOSABS 0.1 08/24/2017 1118   EOSABS 0.1 05/15/2009 1616   BASOSABS 0.0 08/24/2017 1118   BASOSABS 0.0 05/15/2009 1616     Treatments: surgery: see above  Discharge Exam: Blood pressure 129/86, pulse 66, temperature 98.7 F (37.1 C), temperature source Oral, resp. rate 16, height 5\' 2"  (1.575 m), weight 238 lb (108 kg), last menstrual period 08/04/2017, SpO2 98 %. General  appearance: alert and cooperative GI: soft, non-tender; bowel sounds normal; no masses,  no organomegaly Incision/Wound: clean, dry and in tact x 5 with dermabond  Disposition: Discharge disposition: 01-Home or Self Care       Discharge Instructions    (HEART FAILURE PATIENTS) Call MD:  Anytime you have any of the following symptoms: 1) 3 pound weight gain in 24 hours or 5 pounds in 1 week 2) shortness of breath, with or without a dry hacking cough 3) swelling in the hands, feet or stomach 4) if you have to sleep on extra pillows at night in order to breathe.   Complete by:  As directed    Call MD for:  difficulty breathing, headache or visual disturbances   Complete by:  As directed    Call MD for:  extreme fatigue   Complete by:  As directed    Call MD for:  hives   Complete by:  As directed    Call MD for:  persistant dizziness or light-headedness   Complete by:  As directed    Call MD for:  persistant nausea and vomiting   Complete by:  As directed    Call MD for:  redness, tenderness, or signs of infection (pain, swelling, redness, odor or green/yellow discharge around incision site)   Complete by:  As directed    Call MD for:  severe uncontrolled pain   Complete by:  As directed    Call MD for:  temperature >100.4   Complete  by:  As directed    Diet - low sodium heart healthy   Complete by:  As directed    Diet general   Complete by:  As directed    Driving Restrictions   Complete by:  As directed    No driving for 7 days or until off narcotic pain medication   Increase activity slowly   Complete by:  As directed    Remove dressing in 24 hours   Complete by:  As directed    Sexual Activity Restrictions   Complete by:  As directed    No intercourse for 6 weeks     Allergies as of 08/31/2017      Reactions   Codeine Other (See Comments)   headaches   Penicillins    Severe headaches   Bactrim [sulfamethoxazole-trimethoprim] Rash      Medication List    TAKE  these medications   acetaminophen 650 MG CR tablet Commonly known as:  TYLENOL Take 1,300 mg by mouth every 8 (eight) hours.   diclofenac sodium 1 % Gel Commonly known as:  VOLTAREN APPLY 3 GM TO AFFECTED 3 LARGE JOINTS UP TO 3 TIMES A DAY AS NEEDED AS DIRECTED   docusate sodium 100 MG capsule Commonly known as:  COLACE Take 1 capsule (100 mg total) by mouth 2 (two) times daily.   fexofenadine 180 MG tablet Commonly known as:  ALLEGRA Take 180 mg by mouth daily.   fluticasone 50 MCG/ACT nasal spray Commonly known as:  FLONASE Place 2 sprays into both nostrils daily.   ibuprofen 600 MG tablet Commonly known as:  ADVIL,MOTRIN Take 1 tablet (600 mg total) by mouth every 6 (six) hours.   INTEGRA 62.5-62.5-40-3 MG Caps Take 1 tablet by mouth daily.   loteprednol 0.5 % ophthalmic suspension Commonly known as:  LOTEMAX Place 1 drop into both eyes at bedtime.   omeprazole 20 MG capsule Commonly known as:  PRILOSEC Take 20 mg by mouth daily.   ORENCIA CLICKJECT [place] Inject into the skin.   oxyCODONE 5 MG immediate release tablet Commonly known as:  Oxy IR/ROXICODONE Take 1 tablet (5 mg total) by mouth every 4 (four) hours as needed for severe pain or breakthrough pain.   prednisoLONE acetate 1 % ophthalmic suspension Commonly known as:  PRED FORTE Place 1 drop into both eyes every 4 (four) hours as needed (while awake).   predniSONE 10 MG tablet Commonly known as:  DELTASONE Take 1 tablet (10 mg total) by mouth daily with breakfast.   simethicone 125 MG chewable tablet Commonly known as:  MYLICON Chew 537 mg by mouth every 6 (six) hours as needed for flatulence.   UNABLE TO FIND Patient receives allergy injection @@  allergy and asthma both arms last dose 08-14-17   XIIDRA 5 % Soln Generic drug:  Lifitegrast Place 2 drops into both eyes 2 (two) times daily.        Signed: Thereasa Solo 08/31/2017, 9:17 AM

## 2017-08-31 NOTE — Progress Notes (Signed)
Discharge instructions reviewed with patient. All questions answered. Patient wheeled to vehicle with belongings by nurse tech. 

## 2017-08-31 NOTE — Discharge Instructions (Signed)
08/31/2017  Return to work: 4 weeks  Activity: 1. Be up and out of the bed during the day.  Take a nap if needed.  You may walk up steps but be careful and use the hand rail.  Stair climbing will tire you more than you think, you may need to stop part way and rest.   2. No lifting or straining for 6 weeks.  3. No driving for 1 weeks.  Do Not drive if you are taking narcotic pain medicine.  4. Shower daily.  Use soap and water on your incision and pat dry; don't rub.   5. No sexual activity and nothing in the vagina for 12 weeks.  Medications:  - Take ibuprofen and tylenol first line for pain control. Take these regularly (every 6 hours) to decrease the build up of pain.  - If necessary, for severe pain not relieved by ibuprofen, take percocet.  - While taking percocet you should take sennakot every night to reduce the likelihood of constipation. If this causes diarrhea, stop its use.  Diet: 1. Low sodium Heart Healthy Diet is recommended.  2. It is safe to use a laxative if you have difficulty moving your bowels.   Wound Care: 1. Keep clean and dry.  Shower daily.  Reasons to call the Doctor:   Fever - Oral temperature greater than 100.4 degrees Fahrenheit  Foul-smelling vaginal discharge  Difficulty urinating  Nausea and vomiting  Increased pain at the site of the incision that is unrelieved with pain medicine.  Difficulty breathing with or without chest pain  New calf pain especially if only on one side  Sudden, continuing increased vaginal bleeding with or without clots.   Follow-up: 1. See Dr Gerarda Fraction as scheduled.  Contacts: For questions or concerns you should contact:  Dr. Everitt Amber at [phone] After hours and on week-ends call [phone] and ask to speak to the physician on call for Gynecologic Oncology

## 2017-09-01 ENCOUNTER — Encounter: Payer: Self-pay | Admitting: Oncology

## 2017-09-01 DIAGNOSIS — C541 Malignant neoplasm of endometrium: Secondary | ICD-10-CM

## 2017-09-02 ENCOUNTER — Other Ambulatory Visit: Payer: Self-pay | Admitting: Rheumatology

## 2017-09-02 ENCOUNTER — Encounter: Payer: Self-pay | Admitting: Obstetrics

## 2017-09-06 ENCOUNTER — Ambulatory Visit (INDEPENDENT_AMBULATORY_CARE_PROVIDER_SITE_OTHER): Payer: BLUE CROSS/BLUE SHIELD | Admitting: *Deleted

## 2017-09-06 DIAGNOSIS — J309 Allergic rhinitis, unspecified: Secondary | ICD-10-CM | POA: Diagnosis not present

## 2017-09-07 ENCOUNTER — Telehealth: Payer: Self-pay

## 2017-09-07 NOTE — Telephone Encounter (Signed)
Outgoing call to patient regarding she reported having feeling of numbness all over tongue area, "sort of feels burnt".  Explained to patient, per Joylene John NP and Dr Denman George, since had recent surgery, most likely related to tube placed for anesthesia and should start to improve.  Pt's speech is clear and she denies any difficulty with swallowing.  Instructed pt to monitor and let us know if symptoms worsen, otherwise f/u with Dr Gerarda Fraction next week at scheduled appt. No other needs per pt at this time.

## 2017-09-07 NOTE — Telephone Encounter (Signed)
Last Visit: 07/27/17 Next visit: 11/14/17  Okay to refill per Dr. Estanislado Pandy

## 2017-09-08 ENCOUNTER — Telehealth (INDEPENDENT_AMBULATORY_CARE_PROVIDER_SITE_OTHER): Payer: Self-pay | Admitting: Radiology

## 2017-09-08 NOTE — Telephone Encounter (Signed)
ORENCIA 125MG /ML SYRINGES Approvedtoday  Questionnaire submitted. PA Case [id number] Status: Approved. Authorization and Notifications Completed

## 2017-09-09 ENCOUNTER — Other Ambulatory Visit: Payer: Self-pay | Admitting: Physician Assistant

## 2017-09-09 DIAGNOSIS — R9389 Abnormal findings on diagnostic imaging of other specified body structures: Secondary | ICD-10-CM

## 2017-09-12 ENCOUNTER — Telehealth: Payer: Self-pay | Admitting: Oncology

## 2017-09-12 ENCOUNTER — Encounter: Payer: Self-pay | Admitting: Gynecologic Oncology

## 2017-09-12 NOTE — Telephone Encounter (Signed)
Called patient regarding new patient appointment with Dr. Alvy Bimler.  She said she is having a colonoscopy at 2 pm on 09/14/17.  Moved Dr. Gerarda Fraction appointment to 09/15/17 at 1:30 pm and scheduled Dr. Calton Dach appointment for 2 pm on 09/15/17.  Patient verbalized understanding and agreement of appointments.

## 2017-09-12 NOTE — Progress Notes (Signed)
Gynecologic Oncology Multi-Disciplinary Disposition Conference Note  Date of the Conference: September 12, 2017  Patient Name: Dana Hicks  Referring Provider: Dr. Freda Munro Primary GYN Oncologist: Dr. Precious Haws  Stage/Disposition:  Stage IA endometrioid adenocarcinoma and Stage 2 right ovarian endometrioid adenocarcinoma. Disposition is to chemotherapy.  Plan for MRI in one month to follow up on pancreatic lesion noted on previous imaging.  Genetics consultation.     This Multidisciplinary conference took place involving physicians from [place], [place], Radiation Oncology, Pathology, Radiology along with the Gynecologic Oncology Nurse Practitioner and RN.  Comprehensive assessment of the patient's malignancy, staging, need for surgery, chemotherapy, radiation therapy, and need for further testing were reviewed. Supportive measures, both inpatient and following discharge were also discussed. The recommended plan of care is documented. Greater than 35 minutes were spent correlating and coordinating this patient's care.

## 2017-09-13 ENCOUNTER — Telehealth: Payer: Self-pay | Admitting: *Deleted

## 2017-09-13 NOTE — Telephone Encounter (Signed)
Detailed discussion with patient today.  She will be taking prednisone 10 mg p.o. daily right now.  Patient states that she will be starting chemotherapy soon which will last approximately 6 months.

## 2017-09-13 NOTE — Telephone Encounter (Signed)
Prior Authorization for Dana Hicks has been approved for 09/02/17 to 09/08/18/20.    Patient states she found out about 2 weeks ago that she had ovarian cancer. Patient states she had an hysterectomy.  Patient states that it was stage 2. Patient states she is scheduled for a colonoscopy tomorrow and MRI on Friday. Patient states she is meeting with the oncologist on 09/15/17. Patient states has been taken off all her RA meds. Patient will inform us of the plan after meeting with the oncologist. Patient states she is only on Prednisone. Patient states she has been having a flare with increased pain in her knee in which she increased her prednisone for a day and then went to her regular dose. That did help.

## 2017-09-14 ENCOUNTER — Inpatient Hospital Stay: Payer: BLUE CROSS/BLUE SHIELD | Admitting: Obstetrics

## 2017-09-14 DIAGNOSIS — K31819 Angiodysplasia of stomach and duodenum without bleeding: Secondary | ICD-10-CM | POA: Diagnosis not present

## 2017-09-14 DIAGNOSIS — Z980 Intestinal bypass and anastomosis status: Secondary | ICD-10-CM | POA: Diagnosis not present

## 2017-09-14 DIAGNOSIS — Z85038 Personal history of other malignant neoplasm of large intestine: Secondary | ICD-10-CM | POA: Diagnosis not present

## 2017-09-14 DIAGNOSIS — R1012 Left upper quadrant pain: Secondary | ICD-10-CM | POA: Diagnosis not present

## 2017-09-14 DIAGNOSIS — K635 Polyp of colon: Secondary | ICD-10-CM | POA: Diagnosis not present

## 2017-09-15 ENCOUNTER — Other Ambulatory Visit: Payer: Self-pay | Admitting: Gynecologic Oncology

## 2017-09-15 ENCOUNTER — Encounter: Payer: Self-pay | Admitting: Allergy & Immunology

## 2017-09-15 ENCOUNTER — Encounter: Payer: Self-pay | Admitting: Hematology and Oncology

## 2017-09-15 ENCOUNTER — Telehealth: Payer: Self-pay | Admitting: Hematology and Oncology

## 2017-09-15 ENCOUNTER — Inpatient Hospital Stay (HOSPITAL_BASED_OUTPATIENT_CLINIC_OR_DEPARTMENT_OTHER): Payer: BLUE CROSS/BLUE SHIELD | Admitting: Hematology and Oncology

## 2017-09-15 ENCOUNTER — Ambulatory Visit (INDEPENDENT_AMBULATORY_CARE_PROVIDER_SITE_OTHER): Payer: BLUE CROSS/BLUE SHIELD | Admitting: Allergy & Immunology

## 2017-09-15 ENCOUNTER — Encounter: Payer: Self-pay | Admitting: Obstetrics

## 2017-09-15 ENCOUNTER — Inpatient Hospital Stay (HOSPITAL_BASED_OUTPATIENT_CLINIC_OR_DEPARTMENT_OTHER): Payer: BLUE CROSS/BLUE SHIELD | Admitting: Obstetrics

## 2017-09-15 ENCOUNTER — Encounter: Payer: Self-pay | Admitting: Gynecologic Oncology

## 2017-09-15 VITALS — BP 117/73 | HR 102 | Temp 99.1°F | Resp 20 | Ht 62.0 in | Wt 232.5 lb

## 2017-09-15 VITALS — BP 110/86 | HR 97 | Temp 98.4°F | Resp 16 | Ht 62.0 in | Wt 233.2 lb

## 2017-09-15 DIAGNOSIS — N838 Other noninflammatory disorders of ovary, fallopian tube and broad ligament: Secondary | ICD-10-CM

## 2017-09-15 DIAGNOSIS — Z90722 Acquired absence of ovaries, bilateral: Secondary | ICD-10-CM

## 2017-09-15 DIAGNOSIS — M0609 Rheumatoid arthritis without rheumatoid factor, multiple sites: Secondary | ICD-10-CM

## 2017-09-15 DIAGNOSIS — Z85038 Personal history of other malignant neoplasm of large intestine: Secondary | ICD-10-CM

## 2017-09-15 DIAGNOSIS — J3089 Other allergic rhinitis: Secondary | ICD-10-CM | POA: Diagnosis not present

## 2017-09-15 DIAGNOSIS — C561 Malignant neoplasm of right ovary: Secondary | ICD-10-CM

## 2017-09-15 DIAGNOSIS — K87 Disorders of gallbladder, biliary tract and pancreas in diseases classified elsewhere: Secondary | ICD-10-CM | POA: Insufficient documentation

## 2017-09-15 DIAGNOSIS — K909 Intestinal malabsorption, unspecified: Secondary | ICD-10-CM | POA: Insufficient documentation

## 2017-09-15 DIAGNOSIS — J302 Other seasonal allergic rhinitis: Secondary | ICD-10-CM

## 2017-09-15 DIAGNOSIS — Z90710 Acquired absence of both cervix and uterus: Secondary | ICD-10-CM | POA: Diagnosis not present

## 2017-09-15 DIAGNOSIS — Z889 Allergy status to unspecified drugs, medicaments and biological substances status: Secondary | ICD-10-CM

## 2017-09-15 DIAGNOSIS — R971 Elevated cancer antigen 125 [CA 125]: Secondary | ICD-10-CM

## 2017-09-15 DIAGNOSIS — Z87891 Personal history of nicotine dependence: Secondary | ICD-10-CM

## 2017-09-15 DIAGNOSIS — C7982 Secondary malignant neoplasm of genital organs: Secondary | ICD-10-CM

## 2017-09-15 DIAGNOSIS — K869 Disease of pancreas, unspecified: Secondary | ICD-10-CM

## 2017-09-15 DIAGNOSIS — J329 Chronic sinusitis, unspecified: Secondary | ICD-10-CM | POA: Insufficient documentation

## 2017-09-15 DIAGNOSIS — C541 Malignant neoplasm of endometrium: Secondary | ICD-10-CM

## 2017-09-15 DIAGNOSIS — C562 Malignant neoplasm of left ovary: Secondary | ICD-10-CM | POA: Diagnosis not present

## 2017-09-15 DIAGNOSIS — R933 Abnormal findings on diagnostic imaging of other parts of digestive tract: Secondary | ICD-10-CM | POA: Diagnosis not present

## 2017-09-15 DIAGNOSIS — D3910 Neoplasm of uncertain behavior of unspecified ovary: Secondary | ICD-10-CM | POA: Diagnosis not present

## 2017-09-15 DIAGNOSIS — C7961 Secondary malignant neoplasm of right ovary: Secondary | ICD-10-CM | POA: Diagnosis not present

## 2017-09-15 DIAGNOSIS — J328 Other chronic sinusitis: Secondary | ICD-10-CM | POA: Diagnosis not present

## 2017-09-15 DIAGNOSIS — K9089 Other intestinal malabsorption: Secondary | ICD-10-CM

## 2017-09-15 NOTE — Patient Instructions (Addendum)
1. Chronic rhinitis (grasses, trees, ragweed, molds) - We will stop your allergy shots until we see you again.  - Continue with fluticasone 1-2 sprays per nostril daily. - Continue with azelastine 1-2 sprays per nostril daily.  - Continue with Allegra one tablet daily as needed. - We will resume your shots once you are done with chemotherapy.   2. Return in about 6 months (around 03/18/2018).   Please inform us of any Emergency Department visits, hospitalizations, or changes in symptoms. Call us before going to the ED for breathing or allergy symptoms since we might be able to fit you in for a sick visit. Feel free to contact us anytime with any questions, problems, or concerns.  It was a pleasure to see you again today! We will be praying for a quick recovery!   Websites that have reliable patient information: 1. American Academy of Asthma, Allergy, and Immunology: www.aaaai.org 2. Food Allergy Research and Education (FARE): foodallergy.org 3. Mothers of Asthmatics: http://www.asthmacommunitynetwork.org 4. American College of Allergy, Asthma, and Immunology: MonthlyElectricBill.co.uk   Make sure you are registered to vote! If you have moved or changed any of your contact information, you will need to get this updated before voting!

## 2017-09-15 NOTE — Assessment & Plan Note (Signed)
She had history of chronic sinus congestion which is mild She had some mild low-grade fever I do not believe she have active infection She will undergo CT imaging of the chest to complete her staging

## 2017-09-15 NOTE — Progress Notes (Signed)
[place] NOTE  Patient Care Team: [name], MD as PCP - General [facility] Medicine)  ASSESSMENT & PLAN:  Endometrial cancer [facility]) From the endometrial standpoint, she does not need adjuvant treatment. The endometrial sample revealed MSI, raising possibility of genetic disorder Genetic counseling is highly recommended, referral has been set up  Secondary malignant neoplasm of right ovary [facility]) She needs adjuvant chemotherapy for stage II ovarian cancer Per patient request, I will order port placement, chemo education class, chemo consent and to start first dose of treatment on the second week of August She will receive combination chemotherapy with carboplatin and Taxol without growth factor support  Sinusitis, chronic She had history of chronic sinus congestion which is mild She had some mild low-grade fever I do not believe she have active infection She will undergo CT imaging of the chest to complete her staging  Lesion of pancreas She was noted to have abnormal pancreas imaging with mild abdominal pain When we review her imaging study recently at the GYN oncology tumor board, pancreatitis was suspected She is being observed MRI of her abdomen is pending tomorrow  Rheumatoid arthritis of multiple sites with negative rheumatoid factor ([place]) She is on chronic prednisone therapy, under the guidance of rheumatologist I suggest we put her treatment on hold during chemotherapy  History of colon cancer She had strong family history of multiple cancers Genetic counseling is pending Her last colonoscopy was yesterday and it was unremarkable.  She will continue close monitoring/screening programs  H/O seasonal allergies I recommend she stops her medications for allergies during chemotherapy   Orders Placed This Encounter  Procedures  . IR IMAGING GUIDED PORT INSERTION    Standing Status:   Future    Standing Expiration Date:   11/17/2018    Order  Specific Question:   Reason for Exam (SYMPTOM  OR DIAGNOSIS REQUIRED)    Answer:   need port for chemo to start 8/9: outof town until 8/3    Order Specific Question:   Is the patient pregnant?    Answer:   No    Order Specific Question:   Preferred Imaging Location?    Answer:   [facility]     CHIEF COMPLAINTS/PURPOSE OF CONSULTATION:  Stage II ovarian cancer, stage I endometrial cancer, previous history of colon cancer, for adjuvant treatment  HISTORY OF PRESENTING ILLNESS:  Dana Hicks 42 y.o. female is here because of recent diagnosis of ovarian cancer and endometrial cancer. She is an excellent historian.  In the year of 2001, she presented to her doctor with postprandial abdominal pain She was also found to be anemic with positive fecal occult blood She underwent surgery with right hemicolectomy and was found to have stage II colon cancer.  She received adjuvant chemotherapy with leucovorin and 5-FU She had intensive screening program, her last EGD and colonoscopy was 09/14/2017  Recently, she started to have heavy bleeding.  She also started to complain of intermittent lower abdominal pain radiating to the back.  She was concerned about recurrence of colon cancer and was seen by her gastroenterologist who ordered a CT imaging which revealed abnormal pelvic masses.  She was then referred to gynecologist oncologist for surgery.  I have reviewed her chart and materials related to her cancer extensively and collaborated history with the patient. Summary of oncologic history is as follows: Oncology History   MSI positive  Endometrial :endometrioid Ovarian: Endometrioid      Endometrial cancer ([place])  08/18/2017 Imaging    Ct scan abdomen and pelvis 1. Mixed attenuation mass emanates from the right adnexa measuring 10.8 x 8.0 cm very suspicious for right ovarian carcinoma. 2. Abnormality of the tail of the pancreas may be due to mild pancreatitis and small pseudocyst  formation, but neoplasm cannot be excluded. 3. Small amount of ascites within abdomen and pelvis. 4. Small nonobstructing renal calculi bilaterally.       08/30/2017 Pathology Results    1. Uterus and cervix, with left fallopian tube - ENDOMETRIOID ADENOCARCINOMA, FIGO GRADE I, ARISING IN A BACKGROUND OF DIFFUSE COMPLEX ATYPICAL HYPERPLASIA. - CARCINOMA INVADES FOR OF DEPTH OF 0.2 CM WHERE THICKNESS OF MYOMETRIAL WALL IS 2.1 CM. - ALL RESECTION MARGINS ARE NEGATIVE FOR CARCINOMA. - NEGATIVE FOR LYMPHOVASCULAR OR PERINEURAL INVASION. - CERVICAL STROMA IS NOT INVOLVED. - SEE ONCOLOGY TABLE. - SEE NOTE 2. Ovary and fallopian tube, right - PRIMARY OVARIAN ENDOMETRIOID ADENOCARCINOMA, FIGO GRADE II, 12 CM. - THE OVARIAN SURFACE IS FOCALLY INVOLVED BY CARCINOMA. - NEGATIVE FOR LYMPHOVASCULAR INVASION. - BENIGN UNREMARKABLE FALLOPIAN TUBE, NEGATIVE FOR CARCINOMA. - SEE ONCOLOGY TABLE. - SEE NOTE 3. Cul-de-sac biopsy - METASTATIC ADENOCARCINOMA, MOST CONSISTENT WITH PRIMARY OVARIAN ENDOMETRIOID ADENOCARCINOMA. 4. Ovary, left - BENIGN UNREMARKABLE OVARY, NEGATIVE FOR MALIGNANCY. Microscopic Comment 1. UTERUS, CARCINOMA OR CARCINOSARCOMA Procedure: Total hysterectomy with bilateral salpingo-oophorectomy. Histologic type: Endometrioid adenocarcinoma. Histologic Grade: FIGO Grade I Myometrial invasion: Depth of invasion: 2 mm Myometrial thickness: 21 mm Uterine Serosa Involvement: Not identified Cervical stromal involvement: Not identified Extent of involvement of other organs: Not applicable Lymphovascular invasion: Not identified Regional Lymph Nodes: Examined: 0 Sentinel 0 Non-sentinel 0 Total Tumor block for ancillary studies: 1H MMR / MSI testing: Pending Pathologic Stage Classification (pTNM, AJCC 8th edition): pT1a, pNX (v4.1.0.0) 2. OVARY or FALLOPIAN TUBE or PRIMARY PERITONEUM: Procedure: Salpingo-oophorectomy Specimen Integrity: Intact Tumor Site: Right ovary Ovarian  Surface Involvement (required only if applicable): Focally involved by carcinoma Fallopian Tube Surface Involvement (required only if applicable): Not identified Tumor Size: 12 cm Histologic Type: Endometrioid adenocarcinoma Histologic Grade: Grade II Implants (required for advanced stage serous/seromucinous borderline tumors only): Not applicable Other Tissue/ Organ Involvement: Cul de sac biopsy involved by tumor Largest Extrapelvic Peritoneal Focus (required only if applicable): Not applicable Peritoneal/Ascitic Fluid: Negative for carcinoma (case # [id number]) Treatment Effect (required only for high-grade serous carcinomas): Not applicable Regional Lymph Nodes: No lymph nodes submitted or found Number of Lymph Nodes Examined: 0 Pathologic Stage Classification (pTNM, AJCC 8th Edition): pT2b, pN0 Representative Tumor Block: 2B and 2E 1. Molecular study for microsatellite instability and immunohistochemical stains for MMR-related proteins are pending and will be reported in an addendum. 2. Immunohistochemical stain show that the ovarian tumor is positive for CK7 and PAX8 (both diffuse), CDX2 (patchy and weak); and negative for CK20. This immunoprofile is consistent with the above diagnosis. Dr. Lyndon Code has reviewed this case and concurs with the above diagnosis. Molecular study for microsatellite instability and immunohistochemical stains for MMR-related proteins are pending and will be reported in an addendum      08/30/2017 Genetic Testing    Patient has genetic testing done for MSI  Results revealed patient has the following mutation(s): loss of [facility] 2      08/30/2017 Surgery    Surgeon: Mart Piggs, MD Pre-operative Diagnosis:  1. Adnexal mass 2. Abnormal uterine bleeding 3. H/o Cecal CA  Post-operative Diagnosis:  1. Adhesive disease post colon resection 2. Endometrial cancer NOS 3. Adenocarcinoma unknown origin, right ovary, suspicious  for GI primary  Operation:   1. Lysis of adhesions ~30 minutes 2. Robotic-assisted laparoscopic total hysterectomy with right salpingo-oophorectomy and left salpingectomy 3. Left oophorectomy (RA-laparoscopic) 4. Pelvic washings  Findings: Adhesions of omentum to anterior abdominal wall. Enlarged cystic right ovary ~10cm. Uterus had small nodules on serosa near where right adnexa was intimate with the surface. No obvious intraoperative rupture of cyst, although in 2 areas the wall was thin and one of these areas had some bleeding. Slight scarring of left bladder dome to LUS/cervix. Uterus on frozen section c/w hyperplasia and a small focus of endometrial CA - no myo invasion, <2cm in size. Frozen section on the right adnexa was carcinoma, met from colon or possibly Gyn, favor GI primary, defer to permanent. Left ovary was WNL.        09/15/2017 Cancer Staging    Staging form: Corpus Uteri - Carcinoma and Carcinosarcoma, AJCC 8th Edition - Pathologic: FIGO Stage IA (pT1a, pN0, cM0) - Signed by Heath Lark, MD on 09/15/2017       Secondary malignant neoplasm of right ovary ([place])   08/31/2017 Initial Diagnosis    Secondary malignant neoplasm of right ovary ([place])      09/15/2017 Cancer Staging    Staging form: Ovary, Fallopian Tube, and Primary Peritoneal Carcinoma, AJCC 8th Edition - Pathologic: Stage IIB (pT2b, pN0, cM0) - Signed by Heath Lark, MD on 09/15/2017      Her case was reviewed at the recent GYN tumor board.  Due to stage II ovarian cancer, she is referred here for adjuvant chemotherapy. She is recovering well from her abdominal surgery. She continues to have mild intermittent lower back pain but mildly improved since surgery.  She has rheumatoid arthritis, treated by her rheumatologist.  Initially, she received prednisone, subsequently switched to Humira and methotrexate. Most recently, her treatment was switched to [place].  However, with recent diagnosis of cancer, that was discontinued and she  remained on prednisone therapy.  She typically complain of joint pain in her shoulders, elbows and knees.  She denies recent abnormal rectal bleeding.  She complained of chronic sinusitis and has been receiving "allergies injections". She has some mild, intermittent low-grade fever but never above 100 Fahrenheit She has lost at least 15 pounds of weight recently  MEDICAL HISTORY:  Past Medical History:  Diagnosis Date  . Allergic rhinitis, seasonal   . Cecal cancer ([place]) 2001   Stage II (T3N0)  04-11-2001cecum-partial colectomy and completed chemo 2002  . Endometrial cancer ([place]) 08/2017   Stage IA, Grade 1  . History of MRSA infection 01/2017   followed by infectious disease center--  recurrent pustular folliculitis  . Nephrolithiasis    bilateral nonobstructive calculi per CT 08-18-2017  . OA (osteoarthritis)   . Ovarian cancer, right ([place]) 08/2017   Stage II Grade 2 Endometrioid  . Rheumatoid arthritis [facility])    rheumatologist-  dr devenswar-- treated w/ oral prednisone daily and methotrexate injection every 3 wks    SURGICAL HISTORY: Past Surgical History:  Procedure Laterality Date  . BREAST LUMPECTOMY WITH RADIOACTIVE SEED LOCALIZATION Left 06/28/2014   Benign Procedure: RADIOACTIVE SEED LOCALIZATION LEFT BREAST LUMPECTOMY;  Surgeon: Excell Seltzer, MD;  Location: [facility];  Service: General;  Laterality: Left;  . COLONOSCOPY  06/19/14  . EYE SURGERY Left    plug in tear duct  . KNEE ARTHROSCOPY    . PORT A CATH REVISION  2001   in and out  . RIGHT COLECTOMY  06/02/2009  cecum cancer  . ROBOTIC ASSISTED TOTAL HYSTERECTOMY WITH BILATERAL SALPINGO OOPHERECTOMY Bilateral 08/30/2017   Procedure: XI ROBOTIC ASSISTED TOTAL  HYSTERECTOMY BILATERAL  SALPINGO OOPHORECTOMY; LYSIS OF ADHESIONS;  Surgeon: Isabel Caprice, MD;  Location: [facility];  Service: Gynecology;  Laterality: Bilateral;    SOCIAL HISTORY: Social History   Socioeconomic History  . Marital  status: Married    Spouse name: Quita Skye  . Number of children: 2  . Years of education: Not on file  . Highest education level: Not on file  Occupational History  . Occupation: Futures trader  Social Needs  . Financial resource strain: Not on file  . Food insecurity:    Worry: Not on file    Inability: Not on file  . Transportation needs:    Medical: Not on file    Non-medical: Not on file  Tobacco Use  . Smoking status: Former Smoker    Packs/day: 1.00    Years: 5.00    Pack years: 5.00    Types: Cigarettes    Last attempt to quit: 02/05/1998    Years since quitting: 19.6  . Smokeless tobacco: Never Used  Substance and Sexual Activity  . Alcohol use: Yes    Comment: occ  . Drug use: No  . Sexual activity: Not on file  Lifestyle  . Physical activity:    Days per week: Not on file    Minutes per session: Not on file  . Stress: Not on file  Relationships  . Social connections:    Talks on phone: Not on file    Gets together: Not on file    Attends religious service: Not on file    Active member of club or organization: Not on file    Attends meetings of clubs or organizations: Not on file    Relationship status: Not on file  . Intimate partner violence:    Fear of current or ex partner: Not on file    Emotionally abused: Not on file    Physically abused: Not on file    Forced sexual activity: Not on file  Other Topics Concern  . Not on file  Social History Narrative  . Not on file    FAMILY HISTORY: Family History  Problem Relation Age of Onset  . Arthritis/Rheumatoid Mother   . Uterine cancer Mother   . Allergic rhinitis Father   . Heart disease Father   . Drug abuse Son   . Other Maternal Aunt        Unknown GYN CA  . Colon cancer Maternal Uncle   . Angioedema Neg Hx   . Asthma Neg Hx   . Atopy Neg Hx   . Eczema Neg Hx   . Immunodeficiency Neg Hx   . Urticaria Neg Hx     ALLERGIES:  is allergic to codeine; penicillins; and bactrim  [sulfamethoxazole-trimethoprim].  MEDICATIONS:  Current Outpatient Medications  Medication Sig Dispense Refill  . Abatacept (ORENCIA CLICKJECT [place]) Inject into the skin.    Marland Kitchen acetaminophen (TYLENOL) 650 MG CR tablet Take 1,300 mg by mouth every 8 (eight) hours.    . diclofenac sodium (VOLTAREN) 1 % GEL APPLY 3 GM TO AFFECTED 3 LARGE JOINTS UP TO 3 TIMES A DAY AS NEEDED AS DIRECTED 300 g 1  . Fe Fum-FePoly-Vit C-Vit B3 (INTEGRA) 62.5-62.5-40-3 MG CAPS Take 1 tablet by mouth daily.  2  . fexofenadine (ALLEGRA) 180 MG tablet Take 180 mg by mouth daily.    . fluticasone (FLONASE)  50 MCG/ACT nasal spray Place 2 sprays into both nostrils daily.    Marland Kitchen ibuprofen (ADVIL,MOTRIN) 600 MG tablet Take 1 tablet (600 mg total) by mouth every 6 (six) hours. 30 tablet 0  . omeprazole (PRILOSEC) 20 MG capsule Take 20 mg by mouth daily.    . prednisoLONE acetate (PRED FORTE) 1 % ophthalmic suspension Place 1 drop into both eyes every 4 (four) hours as needed (while awake).     . predniSONE (DELTASONE) 10 MG tablet TAKE 1 TABLET BY MOUTH EVERY DAY WITH BREAKFAST 30 tablet 0  . simethicone (MYLICON) 376 MG chewable tablet Chew 125 mg by mouth every 6 (six) hours as needed for flatulence.    Marland Kitchen UNABLE TO FIND Patient receives allergy injection @@ [place] allergy and asthma both arms last dose 08-14-17     No current facility-administered medications for this visit.     REVIEW OF SYSTEMS:   Eyes: Denies blurriness of vision, double vision or watery eyes Ears, nose, mouth, throat, and face: Denies mucositis or sore throat Respiratory: Denies cough, dyspnea or wheezes Cardiovascular: Denies palpitation, chest discomfort or lower extremity swelling Gastrointestinal:  Denies nausea, heartburn or change in bowel habits Skin: Denies abnormal skin rashes Lymphatics: Denies new lymphadenopathy or easy bruising Neurological:Denies numbness, tingling or new weaknesses Behavioral/Psych: Mood is stable, no new changes   All other systems were reviewed with the patient and are negative.  PHYSICAL EXAMINATION: ECOG PERFORMANCE STATUS: 1 - Symptomatic but completely ambulatory Blood pressure 117/73 heart rate 102 respiration rate 20 temperature 99 weight 232.8  GENERAL:alert, no distress and comfortable SKIN: skin color, texture, turgor are normal, no rashes or significant lesions EYES: normal, conjunctiva are pink and non-injected, sclera clear OROPHARYNX:no exudate, no erythema and lips, buccal mucosa, and tongue normal  NECK: supple, thyroid normal size, non-tender, without nodularity LYMPH:  no palpable lymphadenopathy in the cervical, axillary or inguinal LUNGS: clear to auscultation and percussion with normal breathing effort HEART: regular rate & rhythm and no murmurs and no lower extremity edema ABDOMEN:abdomen soft, non-tender and normal bowel sounds.  Well-healed surgical scars Musculoskeletal:no cyanosis of digits and no clubbing  PSYCH: alert & oriented x 3 with fluent speech NEURO: no focal motor/sensory deficits  LABORATORY DATA:  I have reviewed the data as listed Lab Results  Component Value Date   WBC 13.0 (H) 08/31/2017   HGB 10.2 (L) 08/31/2017   HCT 33.1 (L) 08/31/2017   MCV 94.0 08/31/2017   PLT 324 08/31/2017   Recent Labs    10/01/16 0854  04/27/17 0839 07/27/17 0851 08/24/17 1118 08/31/17 0428  NA 138   < > 139 140 138 141  K 4.2   < > 4.1 4.0 3.7 3.9  CL 102   < > 102 102 102 106  CO2 22   < > '28 30 28 27  ' GLUCOSE 88   < > 80 81 91 121*  BUN 12   < > '13 11 11 7  ' CREATININE 0.70   < > 0.75 0.68 0.72 0.71  CALCIUM 9.1   < > 9.1 9.0 9.4 8.5*  GFRNONAA >89   < > 97 106 >60 >60  GFRAA >89   < > 112 123 >60 >60  PROT 6.4   < > 6.5 6.3 7.0  --   ALBUMIN 4.2  --   --   --  3.5  --   AST 16   < > 9* 15 10*  --   ALT 21   < >  '16 22 13  ' --   ALKPHOS 45  --   --   --  78  --   BILITOT 0.5   < > 0.4 0.4 0.2*  --    < > = values in this interval not displayed.     RADIOGRAPHIC STUDIES: I have personally reviewed the radiological images as listed and agreed with the findings in the report. Ct Abdomen Pelvis W Contrast  Result Date: 08/18/2017 CLINICAL DATA:  History of adenocarcinoma of the cecum with chemotherapy completed in 2002, now with upper abdominal pain and bloating EXAM: CT ABDOMEN AND PELVIS WITH CONTRAST TECHNIQUE: Multidetector CT imaging of the abdomen and pelvis was performed using the standard protocol following bolus administration of intravenous contrast. CONTRAST:  166m ISOVUE-300 IOPAMIDOL (ISOVUE-300) INJECTION 61% COMPARISON:  CT abdomen pelvis of 01/16/2007 FINDINGS: Lower chest: The opacities noted previously within the lung bases have cleared. No parenchymal infiltrate or pleural effusion is seen. Hepatobiliary: The liver enhances and the previously noted hemangioma in the right lobe of liver peripherally is stable. There is a small amount of perihepatic ascites present. No calcified gallstones are seen. Pancreas: The head and body the pancreas is unremarkable. However there is abnormality of the tail of the pancreas with the very prostatic fat planes somewhat indistinct and small low-attenuation cc within the tail. This could represent a mild pancreatitis and early pseudocyst formation although pancreatic neoplasm cannot be excluded. Recommend correlation with lab values. The pancreatic duct distally may be somewhat dilated which is worrisome for malignancy. Spleen: The spleen is unremarkable. Adrenals/Urinary Tract: The adrenal glands appear normal. The kidneys enhance and there are small nonobstructing renal calculi bilaterally. The ureters appear normal caliber to the urinary bladder. The urinary bladder is unremarkable. Stomach/Bowel: The stomach is not well distended but no abnormality is evident. No abnormality of small bowel is seen. There is a tiny amount of ascites within the peritoneal cavity. The colon is unremarkable the  anastomosis of colon and small bowel is unremarkable with no complicating features. There are a few small nodes present in the right lower quadrant none of which are pathologically enlarged. Vascular/Lymphatic: The abdominal aorta is normal in caliber. No adenopathy is seen. Reproductive: The uterus is unremarkable with some low-attenuation centrally possibly related to the patient's menstrual cycle. The left adnexa is unremarkable. However there is a large mass emanating from the right adnexa measuring approximately 10.8 x 8.0 cm with mixed attenuation very worrisome for right ovarian carcinoma. Ascites layers within the pelvis. Other: There is small amount of ascites within the abdomen and pelvis. Musculoskeletal: The lumbar vertebrae are in normal alignment with normal intervertebral disc spaces. No lytic or blastic lesion is seen. IMPRESSION: 1. Mixed attenuation mass emanates from the right adnexa measuring 10.8 x 8.0 cm very suspicious for right ovarian carcinoma. 2. Abnormality of the tail of the pancreas may be due to mild pancreatitis and small pseudocyst formation, but neoplasm cannot be excluded. 3. Small amount of ascites within abdomen and pelvis. 4. Small nonobstructing renal calculi bilaterally. Electronically Signed   By: PIvar DrapeM.D.   On: 08/18/2017 14:09    I spent 55 minutes counseling the patient face to face. The total time spent in the appointment was 60 minutes and more than 50% was on counseling.  All questions were answered. The patient knows to call the clinic with any problems, questions or concerns.  NHeath Lark MD 09/15/2017 3:48 PM

## 2017-09-15 NOTE — Patient Instructions (Addendum)
1. Return in one month for a vaginal exam 2. Continued activity restrictions as discussed. 3. Followup Dr. Alvy Bimler today as scheduled

## 2017-09-15 NOTE — Assessment & Plan Note (Signed)
She was noted to have abnormal pancreas imaging with mild abdominal pain When we review her imaging study recently at the GYN oncology tumor board, pancreatitis was suspected She is being observed MRI of her abdomen is pending tomorrow

## 2017-09-15 NOTE — Progress Notes (Signed)
GYNECOLOGIC ONCOLOGY - [facility] at [facility] MD,PhD, Marti Sleigh MD, Precious Haws Md, Everitt Amber MD       [place] at [facility]  Progress Note : Established Patient FOLLOW-UP  Consult was requested by Dr. Freda Munro   Chief Complaint  Patient presents with  . Ovarian mass  . Elevated CA-125    HPI: [name]  is a very nice 42 y.o.  P2  . Interval History Since her last visit she underwent Robotic assisted Hysterectomy/BSO/Washings and resection of a peritoneal (culdesac) implant on 08/30/17. There were no perioperative complications.  Frozen section revealed the uterine cancer, however the ovarian lesion was adenocarcinoma, favor GI primary Final pathology revealed: 1. Uterus and cervix, with left fallopian tube - ENDOMETRIOID ADENOCARCINOMA, FIGO GRADE I, ARISING IN A BACKGROUND OF DIFFUSE COMPLEX ATYPICAL HYPERPLASIA. - CARCINOMA INVADES FOR OF DEPTH OF 0.2 CM WHERE THICKNESS OF MYOMETRIAL WALL IS 2.1 CM. - ALL RESECTION MARGINS ARE NEGATIVE FOR CARCINOMA. - NEGATIVE FOR LYMPHOVASCULAR OR PERINEURAL INVASION. - CERVICAL STROMA IS NOT INVOLVED. - SEE ONCOLOGY TABLE. - SEE NOTE 2. Ovary and fallopian tube, right - PRIMARY OVARIAN ENDOMETRIOID ADENOCARCINOMA, FIGO GRADE II, 12 CM. - THE OVARIAN SURFACE IS FOCALLY INVOLVED BY CARCINOMA. - NEGATIVE FOR LYMPHOVASCULAR INVASION. - BENIGN UNREMARKABLE FALLOPIAN TUBE, NEGATIVE FOR CARCINOMA. - SEE ONCOLOGY TABLE. - SEE NOTE 3. Cul-de-sac biopsy - METASTATIC ADENOCARCINOMA, MOST CONSISTENT WITH PRIMARY OVARIAN ENDOMETRIOID ADENOCARCINOMA. 4. Ovary, left - BENIGN UNREMARKABLE OVARY, NEGATIVE FOR MALIGNANCY. Washings negative Chest imaging pending  Thus she is a presumed Stage 2 Ovarian (Grade 2 Endometrioid) Adenocarcinoma And Stage IA, Grade 1 Endometrioid Uterine Adenocarcinoma  MSH2 is absent on MMR testing  Today we are going to review the treatment plan in the  context of the above pathology. She returns for an early postoperative check and review of the pathology.   The patient is doing well. She is having normal bowel and bladder function. Denies N/V, and pain is controlled. She is still running low grade fevers as she was prior to surgery  She has already had her colonoscopy and upper endoscopy with reported polyp only. No ERCP by the sounds of it. Tomorrow she has an abdominal MRI set up (by her GI office)   -->> Has notable family history and denies any genetic testing. Partial colectomy was 2001 and I don't see they did MMR.  . Presenting History (Oncologic Course if applicable)  Noteworthy is her h/o cecal cancer s/p right hemicolectomy 2001.  She had back pain for a few months which she thought was related to the things she had to lift at work. She went to see orthopedics and exercises were prescribed, which helped. The back pain returned and then was accompanied by "stomach" pains. She had at the same time a MRSA infection of her thighs and was having her medications altered. She attributed the "stomach" pains to the medications. When things continued to bother her after coming off the antibiotics she followed up with GI, given her colon CA history.   GI set her up for scopes (09/01/17) upper and lower, and ordered CTimaging. Last colonoscopy ~3 years ago had "noncancerous" polyps.  CT abdomen pelvis of 08/18/2017; compared to 12/2006  Hepatobiliary - hemangioma in the right lobe of liver stable. Small amount of perihepatic ascites present. Abnormality of the tail of the pancreas - could represent a mild pancreatitis and early pseudocyst formation although pancreatic neoplasm cannot be  excluded. Recommend correlation with lab values. The pancreatic duct distally may be somewhat dilated which is worrisome for malignancy.   Spleen: The spleen is unremarkable.   The colon is unremarkable the anastomosis of colon and small bowel is unremarkable  with no complicating features.   The uterus is unremarkable with some low-attenuation centrally possibly related to the patient's menstrual cycle. The left adnexa is unremarkable. However there is a large mass emanating from the right adnexa measuring approximately 10.8 x 8.0 cm with mixed attenuation very worrisome for right ovarian carcinoma. Ascites layers within the pelvis.   I spoke to GI (PA) who have peripherally reviewed the above with their pancreatic specialist and they feel she should proceed with addressing the adnexa before further workup of the pancreas. Some pancreatic labs were drawn and reportedly normal so they are not overly concerned about pancreatitis.   Patient does admit to EtOH 2-3 times daily after work. Negative for CAGE questions and states she can go for days without EtOH.  Given the adnexal mass she was referred to Dr. Ouida Sills who ordered CA125 and CEA and referred her here.   In addition to the above the patient's last 2 menstrual cycles have been heavier and longer than normal. No intermenstrual bleeding. She does have some nausea, no vomiting. Has loss of appetite, some GI changes (diarrhea), bloating and still the back pain. Noting subjective fevers, but when checks temperature she is 99. Had night sweats last night. Is noting some hot flashes.    Imported EPIC Oncologic History:    No history exists.    Measurement of disease:  CA125  08/23/17 = 139.8  CEA  08/23/17 = 1.1  CA19-9 will be drawn today 08/24/17 . 08/24/17 = 14  Radiology: Ct Abdomen Pelvis W Contrast  Result Date: 08/18/2017 CLINICAL DATA:  History of adenocarcinoma of the cecum with chemotherapy completed in 2002, now with upper abdominal pain and bloating EXAM: CT ABDOMEN AND PELVIS WITH CONTRAST TECHNIQUE: Multidetector CT imaging of the abdomen and pelvis was performed using the standard protocol following bolus administration of intravenous contrast. CONTRAST:  167m ISOVUE-300  IOPAMIDOL (ISOVUE-300) INJECTION 61% COMPARISON:  CT abdomen pelvis of 01/16/2007 FINDINGS: Lower chest: The opacities noted previously within the lung bases have cleared. No parenchymal infiltrate or pleural effusion is seen. Hepatobiliary: The liver enhances and the previously noted hemangioma in the right lobe of liver peripherally is stable. There is a small amount of perihepatic ascites present. No calcified gallstones are seen. Pancreas: The head and body the pancreas is unremarkable. However there is abnormality of the tail of the pancreas with the very prostatic fat planes somewhat indistinct and small low-attenuation cc within the tail. This could represent a mild pancreatitis and early pseudocyst formation although pancreatic neoplasm cannot be excluded. Recommend correlation with lab values. The pancreatic duct distally may be somewhat dilated which is worrisome for malignancy. Spleen: The spleen is unremarkable. Adrenals/Urinary Tract: The adrenal glands appear normal. The kidneys enhance and there are small nonobstructing renal calculi bilaterally. The ureters appear normal caliber to the urinary bladder. The urinary bladder is unremarkable. Stomach/Bowel: The stomach is not well distended but no abnormality is evident. No abnormality of small bowel is seen. There is a tiny amount of ascites within the peritoneal cavity. The colon is unremarkable the anastomosis of colon and small bowel is unremarkable with no complicating features. There are a few small nodes present in the right lower quadrant none of which are pathologically enlarged. Vascular/Lymphatic: The  abdominal aorta is normal in caliber. No adenopathy is seen. Reproductive: The uterus is unremarkable with some low-attenuation centrally possibly related to the patient's menstrual cycle. The left adnexa is unremarkable. However there is a large mass emanating from the right adnexa measuring approximately 10.8 x 8.0 cm with mixed attenuation  very worrisome for right ovarian carcinoma. Ascites layers within the pelvis. Other: There is small amount of ascites within the abdomen and pelvis. Musculoskeletal: The lumbar vertebrae are in normal alignment with normal intervertebral disc spaces. No lytic or blastic lesion is seen. IMPRESSION: 1. Mixed attenuation mass emanates from the right adnexa measuring 10.8 x 8.0 cm very suspicious for right ovarian carcinoma. 2. Abnormality of the tail of the pancreas may be due to mild pancreatitis and small pseudocyst formation, but neoplasm cannot be excluded. 3. Small amount of ascites within abdomen and pelvis. 4. Small nonobstructing renal calculi bilaterally. Electronically Signed   By: Ivar Drape M.D.   On: 08/18/2017 14:09     Current Meds:  Outpatient Encounter Medications as of 09/15/2017  Medication Sig  . Abatacept (ORENCIA CLICKJECT [place]) Inject into the skin.  Marland Kitchen acetaminophen (TYLENOL) 650 MG CR tablet Take 1,300 mg by mouth every 8 (eight) hours.  . diclofenac sodium (VOLTAREN) 1 % GEL APPLY 3 GM TO AFFECTED 3 LARGE JOINTS UP TO 3 TIMES A DAY AS NEEDED AS DIRECTED  . Fe Fum-FePoly-Vit C-Vit B3 (INTEGRA) 62.5-62.5-40-3 MG CAPS Take 1 tablet by mouth daily.  . fexofenadine (ALLEGRA) 180 MG tablet Take 180 mg by mouth daily.  . fluticasone (FLONASE) 50 MCG/ACT nasal spray Place 2 sprays into both nostrils daily.  Marland Kitchen ibuprofen (ADVIL,MOTRIN) 600 MG tablet Take 1 tablet (600 mg total) by mouth every 6 (six) hours.  Marland Kitchen omeprazole (PRILOSEC) 20 MG capsule Take 20 mg by mouth daily.  . prednisoLONE acetate (PRED FORTE) 1 % ophthalmic suspension Place 1 drop into both eyes every 4 (four) hours as needed (while awake).   . predniSONE (DELTASONE) 10 MG tablet TAKE 1 TABLET BY MOUTH EVERY DAY WITH BREAKFAST  . simethicone (MYLICON) 825 MG chewable tablet Chew 125 mg by mouth every 6 (six) hours as needed for flatulence.  Marland Kitchen UNABLE TO FIND Patient receives allergy injection @@ [place] allergy and  asthma both arms last dose 08-14-17  . [DISCONTINUED] docusate sodium (COLACE) 100 MG capsule Take 1 capsule (100 mg total) by mouth 2 (two) times daily. (Patient not taking: Reported on 09/15/2017)  . [DISCONTINUED] Lifitegrast (XIIDRA) 5 % SOLN Place 2 drops into both eyes 2 (two) times daily.  . [DISCONTINUED] loteprednol (LOTEMAX) 0.5 % ophthalmic suspension Place 1 drop into both eyes at bedtime.   . [DISCONTINUED] oxyCODONE (OXY IR/ROXICODONE) 5 MG immediate release tablet Take 1 tablet (5 mg total) by mouth every 4 (four) hours as needed for severe pain or breakthrough pain. (Patient not taking: Reported on 09/15/2017)   No facility-administered encounter medications on file as of 09/15/2017.     Allergy:  Allergies  Allergen Reactions  . Codeine Other (See Comments)    headaches  . Penicillins     Severe headaches  . Bactrim [Sulfamethoxazole-Trimethoprim] Rash    Past Surgical Hx:  Past Surgical History:  Procedure Laterality Date  . BREAST LUMPECTOMY WITH RADIOACTIVE SEED LOCALIZATION Left 06/28/2014   Benign Procedure: RADIOACTIVE SEED LOCALIZATION LEFT BREAST LUMPECTOMY;  Surgeon: Excell Seltzer, MD;  Location: [facility];  Service: General;  Laterality: Left;  . COLONOSCOPY  06/19/14  . EYE SURGERY  Left    plug in tear duct  . KNEE ARTHROSCOPY    . PORT A CATH REVISION  2001   in and out  . RIGHT COLECTOMY  06/02/2009   cecum cancer  . ROBOTIC ASSISTED TOTAL HYSTERECTOMY WITH BILATERAL SALPINGO OOPHERECTOMY Bilateral 08/30/2017   Procedure: XI ROBOTIC ASSISTED TOTAL  HYSTERECTOMY BILATERAL  SALPINGO OOPHORECTOMY; LYSIS OF ADHESIONS;  Surgeon: Isabel Caprice, MD;  Location: [facility];  Service: Gynecology;  Laterality: Bilateral;    Past Medical Hx:  Past Medical History:  Diagnosis Date  . Allergic rhinitis, seasonal   . Cecal cancer ([place]) 2001   Stage II (T3N0)  04-11-2001cecum-partial colectomy and completed chemo 2002  . History of MRSA infection  01/2017   followed by infectious disease center--  recurrent pustular folliculitis  . Nephrolithiasis    bilateral nonobstructive calculi per CT 08-18-2017  . OA (osteoarthritis)   . Rheumatoid arthritis [facility])    rheumatologist-  dr devenswar-- treated w/ oral prednisone daily and methotrexate injection every 3 wks    Past Gynecological History:   GYNECOLOGIC HISTORY:  No LMP recorded. Menarche: 42 years old P 2 LMP 08/04/2017 - see HPI Contraceptive >5 yrs OCP HRT none  Last Pap 03/2017 neg (no HPV done)  Family Hx:  Family History  Problem Relation Age of Onset  . Arthritis/Rheumatoid Mother   . Uterine cancer Mother   . Allergic rhinitis Father   . Heart disease Father   . Drug abuse Son   . Other Maternal Aunt        Unknown GYN CA  . Colon cancer Maternal Uncle   . Angioedema Neg Hx   . Asthma Neg Hx   . Atopy Neg Hx   . Eczema Neg Hx   . Immunodeficiency Neg Hx   . Urticaria Neg Hx     Social Hx:  Tobacco use: Former smoker 19 years. Quit 1997 Alcohol use: 2-3 EtOH per day. Illicit Drug use: none Illicit IV Drug use: none  Review of Systems:  Review of Systems  Constitutional: Positive for fever.  All other systems reviewed and are negative.   Vitals:  Blood pressure 117/73, pulse (!) 102, temperature 99.1 F (37.3 C), temperature source Oral, resp. rate 20, height '5\' 2"'  (1.575 m), weight 232 lb 8 oz (105.5 kg), SpO2 99 %. Body mass index is 42.52 kg/m.   Physical Exam:  General :  Overweight, well developed, 42 y.o., female in no apparent distress HEENT:  Normocephalic/atraumatic, symmetric, EOMI, eyelids normal Neck:   Supple, no masses.  Lymphatics:  No cervical/ submandibular/ supraclavicular/ infraclavicular/ inguinal adenopathy Respiratory:  Respirations unlabored, no use of accessory muscles CV:   Deferred Breast:  Deferred Musculoskeletal: No CVA tenderness, normal muscle strength. Abdomen:  Soft, non-tender and nondistended. No evidence  of hernia. No masses. Extremities:  No lymphedema, no erythema, non-tender. Skin:   Normal inspection Neuro/Psych:  No focal motor deficit, no abnormal mental status. Normal gait. Normal affect. Alert and oriented to person, place, and time  Genito Urinary: Vulva: Normal external female genitalia.  Bladder/urethra: Urethral meatus normal in size and location. No lesions or   masses, well supported bladder Speculum exam: Vagina: No lesion, no discharge, no bleeding. Cervix: Normal appearing, no lesions. No discharge. Bimanual exam: No CMT Uterus: Difficult to delineate given habitus. Mobile.  Adnexa: No masses but again difficult exam given habitus Rectovaginal:  Good tone, no masses, no cul de sac nodularity, no parametrial involvement or nodularity.   Assessment:  Stage IA, G1 EM Uterine CA Stage 2 G2 Ovarian CA (endometrioid type) Personal h/o cecal cancer Pancreatic lesion NOS  Plan: 1. Endometrial cancer o She was given a copy of her reports today o No further treatment based on her low risk for recurrence (GOG 99 factors including DOI, Grade, and LVSI status) o MMR / MSI testing indicates this may be hereditary in nature and personal history is concerning. Genetics has been arranged. o Surveillance plan i. RTC Q3 months for pelvic  ii. Annual Pap every year +/- since she will not be receiving adjuvant radiation therapy and the vaginal cuff is the site at greatest risk for recurrence. 2. Ovarian cancer o We discussed it would have been my preference to perform lymph node dissection and peritoneal staging  o However at this point that would be prognostic only. We discussed this today and I offered take back for prognostic indication but I do not recommend as that will delay her therapy and not change the outcome/treatment. o She is Stage 2 and chemotherapy will be recommended o We discussed the plan for 6 cycles of chemo every 3 weeks and then surveillance following that with  pelvic and CA125. 3. RTC one month for vaginal cuff check o Continued activity restrictions reviewed. 4. Chest imaging will be obtained. 5. Pancreatic mass/lesion o Will defer to her GI. 79. Genetics o H/o cecal CA - She doesn't look to have had genetic testing or tumor profiling done, perhaps because the resection was 2011. o I have refered her to genetics  o They may find a Lynch syndrome and make recommendations for closer GI followup.  Approximately 15 minutes spent with the patient face-to-face with > 50% of this time spent on counseling and coordination of care. This included discussion about prognosis, therapy recommendations including chemotherapy, and postoperative side effects that are beyond the scope of routine postoperative care.  Isabel Caprice, MD  09/15/2017, 2:10 PM       Cc: Freda Munro, MD (Referring Ob/Gyn) [name], MD (PCP) Laurence Spates, MD (GI)

## 2017-09-15 NOTE — Assessment & Plan Note (Signed)
She is on chronic prednisone therapy, under the guidance of rheumatologist I suggest we put her treatment on hold during chemotherapy

## 2017-09-15 NOTE — Assessment & Plan Note (Signed)
She needs adjuvant chemotherapy for stage II ovarian cancer Per patient request, I will order port placement, chemo education class, chemo consent and to start first dose of treatment on the second week of August She will receive combination chemotherapy with carboplatin and Taxol without growth factor support

## 2017-09-15 NOTE — Telephone Encounter (Signed)
Gave avs and calendar ° °

## 2017-09-15 NOTE — Progress Notes (Signed)
CT chest to complete staging

## 2017-09-15 NOTE — Assessment & Plan Note (Signed)
From the endometrial standpoint, she does not need adjuvant treatment. The endometrial sample revealed MSI, raising possibility of genetic disorder Genetic counseling is highly recommended, referral has been set up

## 2017-09-15 NOTE — Progress Notes (Signed)
FOLLOW UP  Date of Service/Encounter:  09/15/17   Assessment:   Seasonal and perennial allergic rhinitis (grasses, trees, ragweed, molds) - halting immunotherapy for now  Complex medication history, including recently diagnosed stage 2 ovarian cancer  Plan/Recommendations:   1. Chronic rhinitis (grasses, trees, ragweed, molds) - We will stop your allergy shots until we see you again.  - Continue with fluticasone 1-2 sprays per nostril daily. - Continue with azelastine 1-2 sprays per nostril daily.  - Continue with Allegra one tablet daily as needed.  - We will resume your shots once you are done with chemotherapy.   - Once we resume the shots, we can advance more aggressively on Schedule C since she had reached the Red Vial already.  2. Return in about 6 months (around 03/18/2018).  Subjective:   Dana Hicks is a 42 y.o. female presenting today for follow up of  Chief Complaint  Patient presents with  . Immunotherapy    needs to discuss due to ca    Dana Hicks has a history of the following: Patient Active Problem List   Diagnosis Date Noted  . Sinusitis, chronic 09/15/2017  . Lesion of pancreas 09/15/2017  . Endometrial cancer ([place]) 08/31/2017  . Secondary malignant neoplasm of right ovary ([place]) 08/31/2017  . Pustular folliculitis 77/93/9030  . MRSA colonization 05/12/2017  . Left foot pain 01/10/2017  . Osteoarthritis of both knees 12/01/2016  . Class 3 severe obesity due to excess calories without serious comorbidity with body mass index (BMI) of 40.0 to 44.9 in adult ([place]) 10/01/2016  . High risk medication use 02/06/2016  . Family history of rheumatoid arthritis 02/06/2016  . H/O seasonal allergies 02/06/2016  . Rheumatoid arthritis of multiple sites with negative rheumatoid factor ([place]) 02/03/2016  . History of colon cancer 02/03/2016  . HLA B27 (HLA B27 positive) 02/03/2016    History obtained from: chart review and patient.  [place] Primary Care Provider is [name], MD.    Oncologist: Dr. Heath Lark   Dana Hicks is a 42 y.o. female presenting for a follow up visit.  She was last seen in May 2018.  At that time, she underwent skin testing that demonstrated sensitizations to grasses, trees, ragweed, and molds.  We continue Dymista 2 sprays per nostril daily, Allegra 1 tablet daily, and Bepreve eyedrops as needed.  She did make the decision to start allergen immunotherapy.  Since the last visit, she was diagnosed with ovarian cancer at the first of the month. She underwent a total hysterectomy on July 9th. It is now at stage 2. There were no lymph nodes removed. They are starting chemotherapy on August 9th. She is going to have chemo for 4.5 months. There were thought that she had pancreatitis, but the colonoscopy and endoscopy was normal. There are thinking that she might have Lynch syndrome and her children are undergoing genetic testing for this. She has a history of early onset colon cancer at the age of 21.   Dana Hicks is on allergen immunotherapy. She receives two injections. Immunotherapy script #1 contains molds. She currently receives 0.74m of the RED vial (1/100). Immunotherapy script #2 contains trees, weeds and grasses. She currently receives 0.239mof the RED vial (1/100). She started shots July of 2018 and reached maintenance in January of 2019.  Otherwise, there have been no changes to her past medical history, surgical history, family history, or social history.    Review of Systems: a 14-point review of systems  is pertinent for what is mentioned in HPI.  Otherwise, all other systems were negative. Constitutional: negative other than that listed in the HPI Eyes: negative other than that listed in the HPI Ears, nose, mouth, throat, and face: negative other than that listed in the HPI Respiratory: negative other than that listed in the HPI Cardiovascular: negative other than that listed in the  HPI Gastrointestinal: negative other than that listed in the HPI Genitourinary: negative other than that listed in the HPI Integument: negative other than that listed in the HPI Hematologic: negative other than that listed in the HPI Musculoskeletal: negative other than that listed in the HPI Neurological: negative other than that listed in the HPI Allergy/Immunologic: negative other than that listed in the HPI    Objective:   Blood pressure 110/86, pulse 97, temperature 98.4 F (36.9 C), temperature source Tympanic, resp. rate 16, height _0  (1.575 m), weight 233 lb 3.2 oz (105.8 kg), last menstrual period 08/30/2017, SpO2 97 %. Body mass index is 42.65 kg/m.   Physical Exam:  General: Alert, interactive, in no acute distress. Hopeful. Pleasant.   Eyes: No conjunctival injection bilaterally, no discharge on the right, no discharge on the left and no Horner-Trantas dots present. PERRL bilaterally. EOMI without pain. No photophobia.  Ears: Right TM pearly gray with normal light reflex, Left TM pearly gray with normal light reflex, Right TM intact without perforation and Left TM intact without perforation.  Nose/Throat: External nose within normal limits, nasal crease present and septum midline. Turbinates edematous and pale with clear discharge. Posterior oropharynx erythematous without cobblestoning in the posterior oropharynx. Tonsils 2+ without exudates.  Tongue without thrush. Lungs: Clear to auscultation without wheezing, rhonchi or rales. No increased work of breathing. CV: Normal S1/S2. No murmurs. Capillary refill <2 seconds.  Skin: Warm and dry, without lesions or rashes. Neuro:   Grossly intact. No focal deficits appreciated. Responsive to questions.  Diagnostic studies: none      Salvatore Marvel, MD  [facility] [place] [facility]

## 2017-09-15 NOTE — Progress Notes (Signed)
START ON PATHWAY REGIMEN - Ovarian     A cycle is every 21 days:     Paclitaxel      Carboplatin   **Always confirm dose/schedule in your pharmacy ordering system**  Patient Characteristics: Postoperative without Neoadjuvant Therapy (Pathologic Staging), Newly Diagnosed, Adjuvant Therapy, Stage IIA Therapeutic Status: Postoperative without Neoadjuvant Therapy (Pathologic Staging) BRCA Mutation Status: Awaiting Test Results AJCC 8 Stage Grouping: II AJCC M Category: cM0 AJCC T Category: pT2 AJCC N Category: pN0 Intent of Therapy: Curative Intent, Discussed with Patient 

## 2017-09-15 NOTE — Assessment & Plan Note (Signed)
She had strong family history of multiple cancers Genetic counseling is pending Her last colonoscopy was yesterday and it was unremarkable.  She will continue close monitoring/screening programs

## 2017-09-15 NOTE — Assessment & Plan Note (Signed)
I recommend she stops her medications for allergies during chemotherapy

## 2017-09-16 ENCOUNTER — Telehealth: Payer: Self-pay | Admitting: *Deleted

## 2017-09-16 ENCOUNTER — Ambulatory Visit
Admission: RE | Admit: 2017-09-16 | Discharge: 2017-09-16 | Disposition: A | Discharge status: Home / Self Care | Payer: BLUE CROSS/BLUE SHIELD | Source: Ambulatory Visit | Attending: Physician Assistant | Admitting: Physician Assistant

## 2017-09-16 DIAGNOSIS — D1809 Hemangioma of other sites: Secondary | ICD-10-CM | POA: Diagnosis not present

## 2017-09-16 DIAGNOSIS — K635 Polyp of colon: Secondary | ICD-10-CM | POA: Diagnosis not present

## 2017-09-16 DIAGNOSIS — K869 Disease of pancreas, unspecified: Secondary | ICD-10-CM | POA: Diagnosis not present

## 2017-09-16 DIAGNOSIS — R9389 Abnormal findings on diagnostic imaging of other specified body structures: Secondary | ICD-10-CM

## 2017-09-16 MED ORDER — GADOBENATE DIMEGLUMINE 529 MG/ML IV SOLN
20.0000 mL | Freq: Once | INTRAVENOUS | Status: AC | PRN
  Administered 2017-09-16: 20 mL via INTRAVENOUS

## 2017-09-16 NOTE — Telephone Encounter (Signed)
Patient called back and needs to reschedule her CT chest. Gave the patient the phone number for central scheduling

## 2017-09-19 ENCOUNTER — Telehealth: Payer: Self-pay

## 2017-09-19 NOTE — Telephone Encounter (Signed)
She called and left a message. Dana Hicks called and they found a spot on her Pancreas a Dana Goody, PA ordered scan. She had a scan on 7/26. She is to start chemo on 8/9 and port placed on 8/5. Does this effect her treatment and start date?

## 2017-09-19 NOTE — Telephone Encounter (Signed)
In my opinion, we should proceed with chemo regardless We/I can reorder imaging after 1 month and we can hold chemo for biopsy if needed

## 2017-09-19 NOTE — Telephone Encounter (Signed)
Deliah Goody, PA called and left a message to call her regarding patients recent MRI showing a pancreatic lesion.  Called back and talked with Deliah Goody, PA gave her Dr. Alvy Bimler cell phone # to have Dr. Paulita Fujita call her regarding patient. She verbalized understanding.

## 2017-09-19 NOTE — Telephone Encounter (Signed)
Called and given below message. She verbalized understanding. Instructed to call for questions.

## 2017-09-21 ENCOUNTER — Ambulatory Visit (HOSPITAL_COMMUNITY): Payer: BLUE CROSS/BLUE SHIELD

## 2017-09-23 ENCOUNTER — Other Ambulatory Visit: Payer: Self-pay | Admitting: Radiology

## 2017-09-26 ENCOUNTER — Encounter (HOSPITAL_COMMUNITY): Payer: Self-pay | Admitting: *Deleted

## 2017-09-26 ENCOUNTER — Other Ambulatory Visit: Payer: Self-pay

## 2017-09-26 ENCOUNTER — Ambulatory Visit (HOSPITAL_COMMUNITY)
Admission: RE | Admit: 2017-09-26 | Discharge: 2017-09-26 | Disposition: A | Discharge status: Home / Self Care | Payer: BLUE CROSS/BLUE SHIELD | Source: Ambulatory Visit | Attending: Hematology and Oncology | Admitting: Hematology and Oncology

## 2017-09-26 DIAGNOSIS — C541 Malignant neoplasm of endometrium: Secondary | ICD-10-CM | POA: Diagnosis not present

## 2017-09-26 DIAGNOSIS — C561 Malignant neoplasm of right ovary: Secondary | ICD-10-CM | POA: Diagnosis not present

## 2017-09-26 DIAGNOSIS — Z87891 Personal history of nicotine dependence: Secondary | ICD-10-CM | POA: Insufficient documentation

## 2017-09-26 DIAGNOSIS — Z8614 Personal history of Methicillin resistant Staphylococcus aureus infection: Secondary | ICD-10-CM | POA: Insufficient documentation

## 2017-09-26 DIAGNOSIS — Z7952 Long term (current) use of systemic steroids: Secondary | ICD-10-CM | POA: Insufficient documentation

## 2017-09-26 DIAGNOSIS — C569 Malignant neoplasm of unspecified ovary: Secondary | ICD-10-CM | POA: Diagnosis not present

## 2017-09-26 DIAGNOSIS — Z7951 Long term (current) use of inhaled steroids: Secondary | ICD-10-CM | POA: Insufficient documentation

## 2017-09-26 DIAGNOSIS — M069 Rheumatoid arthritis, unspecified: Secondary | ICD-10-CM | POA: Diagnosis not present

## 2017-09-26 DIAGNOSIS — M1990 Unspecified osteoarthritis, unspecified site: Secondary | ICD-10-CM | POA: Diagnosis not present

## 2017-09-26 DIAGNOSIS — Z5111 Encounter for antineoplastic chemotherapy: Secondary | ICD-10-CM | POA: Diagnosis not present

## 2017-09-26 DIAGNOSIS — C7961 Secondary malignant neoplasm of right ovary: Secondary | ICD-10-CM

## 2017-09-26 HISTORY — PX: IR IMAGING GUIDED PORT INSERTION: IMG5740

## 2017-09-26 LAB — CBC WITH DIFFERENTIAL/PLATELET
Basophils Absolute: 0 10*3/uL (ref 0.0–0.1)
Basophils Relative: 0 %
Eosinophils Absolute: 0 10*3/uL (ref 0.0–0.7)
Eosinophils Relative: 0 %
HCT: 36.6 % (ref 36.0–46.0)
Hemoglobin: 11.3 g/dL — ABNORMAL LOW (ref 12.0–15.0)
Lymphocytes Relative: 13 %
Lymphs Abs: 1.1 10*3/uL (ref 0.7–4.0)
MCH: 28.3 pg (ref 26.0–34.0)
MCHC: 30.9 g/dL (ref 30.0–36.0)
MCV: 91.7 fL (ref 78.0–100.0)
Monocytes Absolute: 0.3 10*3/uL (ref 0.1–1.0)
Monocytes Relative: 3 %
Neutro Abs: 6.9 10*3/uL (ref 1.7–7.7)
Neutrophils Relative %: 84 %
Platelets: 279 10*3/uL (ref 150–400)
RBC: 3.99 MIL/uL (ref 3.87–5.11)
RDW: 14.5 % (ref 11.5–15.5)
WBC: 8.2 10*3/uL (ref 4.0–10.5)

## 2017-09-26 LAB — BASIC METABOLIC PANEL WITH GFR
Anion gap: 11 (ref 5–15)
BUN: 9 mg/dL (ref 6–20)
CO2: 25 mmol/L (ref 22–32)
Calcium: 9.1 mg/dL (ref 8.9–10.3)
Chloride: 107 mmol/L (ref 98–111)
Creatinine, Ser: 0.64 mg/dL (ref 0.44–1.00)
GFR calc Af Amer: >60 mL/min
GFR calc non Af Amer: >60 mL/min
Glucose, Bld: 121 mg/dL — ABNORMAL HIGH (ref 70–99)
Potassium: 4.1 mmol/L (ref 3.5–5.1)
Sodium: 143 mmol/L (ref 135–145)

## 2017-09-26 LAB — PROTIME-INR
INR: 0.94
Prothrombin Time: 12.5 seconds (ref 11.4–15.2)

## 2017-09-26 MED ORDER — FENTANYL CITRATE (PF) 100 MCG/2ML IJ SOLN
INTRAMUSCULAR | Status: AC
  Filled 2017-09-26: qty 2

## 2017-09-26 MED ORDER — CLINDAMYCIN PHOSPHATE 600 MG/50ML IV SOLN
600.0000 mg | Freq: Once | INTRAVENOUS | Status: AC
  Administered 2017-09-26: 600 mg via INTRAVENOUS
  Filled 2017-09-26: qty 50

## 2017-09-26 MED ORDER — LIDOCAINE-EPINEPHRINE (PF) 1 %-1:200000 IJ SOLN
INTRAMUSCULAR | Status: AC | PRN
  Administered 2017-09-26: 5 mL

## 2017-09-26 MED ORDER — LIDOCAINE-EPINEPHRINE (PF) 2 %-1:200000 IJ SOLN
INTRAMUSCULAR | Status: AC
  Filled 2017-09-26: qty 20

## 2017-09-26 MED ORDER — HEPARIN SOD (PORK) LOCK FLUSH 100 UNIT/ML IV SOLN
INTRAVENOUS | Status: AC
  Filled 2017-09-26: qty 5

## 2017-09-26 MED ORDER — SODIUM CHLORIDE 0.9 % IV SOLN
INTRAVENOUS | Status: DC
  Administered 2017-09-26: 13:00:00 via INTRAVENOUS

## 2017-09-26 MED ORDER — FENTANYL CITRATE (PF) 100 MCG/2ML IJ SOLN
INTRAMUSCULAR | Status: AC | PRN
  Administered 2017-09-26 (×2): 50 ug via INTRAVENOUS

## 2017-09-26 MED ORDER — MIDAZOLAM HCL 2 MG/2ML IJ SOLN
INTRAMUSCULAR | Status: AC
  Filled 2017-09-26: qty 4

## 2017-09-26 MED ORDER — LIDOCAINE-EPINEPHRINE (PF) 1 %-1:200000 IJ SOLN
INTRAMUSCULAR | Status: AC | PRN
  Administered 2017-09-26: 10 mL

## 2017-09-26 MED ORDER — MIDAZOLAM HCL 2 MG/2ML IJ SOLN
INTRAMUSCULAR | Status: AC | PRN
  Administered 2017-09-26: 2 mg via INTRAVENOUS
  Administered 2017-09-26 (×2): 1 mg via INTRAVENOUS

## 2017-09-26 NOTE — Consult Note (Signed)
Chief Complaint: Patient was seen in consultation today for Port-A-Cath placement  Referring Physician(s): Gorsuch,Ni  Supervising Physician: Sandi Mariscal  Patient Status: [name] - Out-pt  History of Present Illness: Dana Hicks is a 42 y.o. female with history of endometrial carcinoma as well as right ovarian carcinoma diagnosed last month, status post surgery.  She also had remote colon cancer in 2001 with prior right hemicolectomy and chemotherapy.  She has had prior left chest wall Port-A-Cath placed by surgery but since removed.  She presents today for new Port-A-Cath placement for chemotherapy.  Past Medical History:  Diagnosis Date  . Allergic rhinitis, seasonal   . 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 Endometrial cancer ([place]) 08/2017   Stage IA, Grade 1  . History of MRSA infection 01/2017   followed by infectious disease center--  recurrent pustular folliculitis  . Nephrolithiasis    bilateral nonobstructive calculi per CT 08-18-2017  . OA (osteoarthritis)   . Ovarian cancer, right ([place]) 08/2017   Stage II Grade 2 Endometrioid  . Rheumatoid arthritis [facility])    rheumatologist-  dr devenswar-- treated w/ oral prednisone daily and methotrexate injection every 3 wks    Past Surgical History:  Procedure Laterality Date  . BREAST LUMPECTOMY WITH RADIOACTIVE SEED LOCALIZATION Left 06/28/2014   Benign Procedure: RADIOACTIVE SEED LOCALIZATION LEFT BREAST LUMPECTOMY;  Surgeon: Excell Seltzer, MD;  Location: [facility];  Service: General;  Laterality: Left;  . COLONOSCOPY  06/19/14  . EYE SURGERY Left    plug in tear duct  . KNEE ARTHROSCOPY    . PORT A CATH REVISION  2001   in and out  . RIGHT COLECTOMY  06/02/2009   cecum cancer  . ROBOTIC ASSISTED TOTAL HYSTERECTOMY WITH BILATERAL SALPINGO OOPHERECTOMY Bilateral 08/30/2017   Procedure: XI ROBOTIC ASSISTED TOTAL  HYSTERECTOMY BILATERAL   SALPINGO OOPHORECTOMY; LYSIS OF ADHESIONS;  Surgeon: Isabel Caprice, MD;  Location: [facility];  Service: Gynecology;  Laterality: Bilateral;    Allergies: Codeine; Penicillins; and Bactrim [sulfamethoxazole-trimethoprim]  Medications: Prior to Admission medications   Medication Sig Start Date End Date Taking? Authorizing Provider  Abatacept (ORENCIA CLICKJECT [place]) Inject into the skin.    [provider]  acetaminophen (TYLENOL) 650 MG CR tablet Take 1,300 mg by mouth every 8 (eight) hours.    [provider]  diclofenac sodium (VOLTAREN) 1 % GEL APPLY 3 GM TO AFFECTED 3 LARGE JOINTS UP TO 3 TIMES A DAY AS NEEDED AS DIRECTED 05/02/17   Bo Merino, MD  Fe Fum-FePoly-Vit C-Vit B3 (INTEGRA) 62.5-62.5-40-3 MG CAPS Take 1 tablet by mouth daily. 08/19/17   [provider]  fexofenadine (ALLEGRA) 180 MG tablet Take 180 mg by mouth daily.    [provider]  fluticasone (FLONASE) 50 MCG/ACT nasal spray Place 2 sprays into both nostrils daily.    [provider]  ibuprofen (ADVIL,MOTRIN) 600 MG tablet Take 1 tablet (600 mg total) by mouth every 6 (six) hours. 08/31/17   Everitt Amber, MD  omeprazole (PRILOSEC) 20 MG capsule Take 20 mg by mouth daily.    [provider]  prednisoLONE acetate (PRED FORTE) 1 % ophthalmic suspension Place 1 drop into both eyes every 4 (four) hours as needed (while awake).     [provider]  predniSONE (DELTASONE) 10 MG tablet TAKE 1 TABLET BY MOUTH EVERY DAY WITH BREAKFAST 09/07/17   Bo Merino, MD  simethicone ([place]) 654  MG chewable tablet Chew 125 mg by mouth every 6 (six) hours as needed for flatulence.    [provider]  UNABLE TO FIND Patient receives allergy injection @@ Cecilia allergy and asthma both arms last dose 08-14-17    [provider]     Family History  Problem Relation Age of Onset  . Arthritis/Rheumatoid Mother   . Uterine cancer Mother   . Allergic  rhinitis Father   . Heart disease Father   . Drug abuse Son   . Other Maternal Aunt        Unknown GYN CA  . Colon cancer Maternal Uncle   . Angioedema Neg Hx   . Asthma Neg Hx   . Atopy Neg Hx   . Eczema Neg Hx   . Immunodeficiency Neg Hx   . Urticaria Neg Hx     Social History   Socioeconomic History  . Marital status: Married    Spouse name: Quita Skye  . Number of children: 2  . Years of education: Not on file  . Highest education level: Not on file  Occupational History  . Occupation: Futures trader  Social Needs  . Financial resource strain: Not on file  . Food insecurity:    Worry: Not on file    Inability: Not on file  . Transportation needs:    Medical: Not on file    Non-medical: Not on file  Tobacco Use  . Smoking status: Former Smoker    Packs/day: 1.00    Years: 5.00    Pack years: 5.00    Types: Cigarettes    Last attempt to quit: 02/05/1998    Years since quitting: 19.6  . Smokeless tobacco: Never Used  Substance and Sexual Activity  . Alcohol use: Yes    Comment: occ  . Drug use: No  . Sexual activity: Not on file  Lifestyle  . Physical activity:    Days per week: Not on file    Minutes per session: Not on file  . Stress: Not on file  Relationships  . Social connections:    Talks on phone: Not on file    Gets together: Not on file    Attends religious service: Not on file    Active member of club or organization: Not on file    Attends meetings of clubs or organizations: Not on file    Relationship status: Not on file  Other Topics Concern  . Not on file  Social History Narrative  . Not on file      Review of Systems denies high fever, headache, chest pain, dyspnea, cough, abdominal/back pain, nausea, vomiting or bleeding.  Vital Signs: Blood pressure 110/86, heart rate 97, temp 98.4, respirations 16, O2 sat 97% room air  Ht 5\' 2"  (1.575 m)   Wt 230 lb (104.3 kg)   LMP 08/30/2017   BMI 42.07 kg/m   Physical Exam awake, alert.   Chest clear to auscultation bilaterally.  Heart with regular rate and rhythm.  Abdomen soft, positive bowel sounds, nontender.  Extremities with full range of motion  Imaging: Mr Abdomen Wwo Contrast  Result Date: 09/16/2017 CLINICAL DATA:  Pancreatic lesion. EXAM: MRI ABDOMEN WITHOUT AND WITH CONTRAST TECHNIQUE: Multiplanar multisequence MR imaging of the abdomen was performed both before and after the administration of intravenous contrast. CONTRAST:  34mL MULTIHANCE GADOBENATE DIMEGLUMINE 529 MG/ML IV SOLN COMPARISON:  CT 08/18/2017 FINDINGS: Lower chest:  Lung bases are clear Hepatobiliary: Peripherally enhancing lesion in the superior central  RIGHT hepatic lobe measuring 2.6 cm is most consistent benign hemangioma. No additional hepatic lesions are present. Normal gallbladder. Normal common bile duct. Gallbladder is noninflamed with a large gallstone measuring 15 mm. Pancreas: There is a complex cystic and solid lesion in the tail the pancreas measuring 3.0 x 1.8 cm compared to 2.9 x 2.1 cm on comparison CT. This lesion is hyperintense on noncontrast T1 weighted imaging and demonstrates hypoenhancement on postcontrast imaging (series 17). Lesions mixed signal intensity on T2 weighted imaging. There is no communication with the pancreatic duct. There is mild inflammation in the peripancreatic fat at this level. No organized fluid collections. The more distal body and head of the pancreas is normal. There is no downstream pancreatic duct dilatation. Spleen: Normal spleen. Adrenals/urinary tract: Adrenal glands and kidneys are normal. Stomach/Bowel: Stomach and limited of the small bowel is unremarkable Vascular/Lymphatic: Abdominal aortic normal caliber. No retroperitoneal periportal lymphadenopathy. Musculoskeletal: No aggressive osseous lesion IMPRESSION: 1. Mixed cystic and solid hypoenhancing lesion in the tail the pancreas is not improved from CT 07/29/2017. Differential includes focal pancreatitis  versus pancreatic neoplasm. With no improvement in 1 month time and atypical / irregular imaging features, recommend endoscopic ultrasound and potential tissue sampling. This lesion may be assessable from the stomach. 2. Solitary gallstone within normal gallbladder. No normal biliary tree. 3. Benign hemangioma in the RIGHT hepatic lobe. These results will be called to the ordering clinician or representative by the Radiologist Assistant, and communication documented in the PACS or zVision Dashboard. Electronically Signed   By: Suzy Bouchard M.D.   On: 09/16/2017 13:06    Labs:  CBC: Recent Labs    04/27/17 0839 04/27/17 0853 07/27/17 0851 08/24/17 1118 08/31/17 0428  WBC 12.0* CANCELED 10.4 17.8* 13.0*  HGB 12.7  --  12.5 11.9 10.2*  HCT 38.1  --  37.6 38.0 33.1*  PLT 283  --  266 317 324    COAGS: No results for input(s): INR, APTT in the last 8760 hours.  BMP: Recent Labs    04/27/17 0839 07/27/17 0851 08/24/17 1118 08/31/17 0428  NA 139 140 138 141  K 4.1 4.0 3.7 3.9  CL 102 102 102 106  CO2 28 30 28 27   GLUCOSE 80 81 91 121*  BUN 13 11 11 7   CALCIUM 9.1 9.0 9.4 8.5*  CREATININE 0.75 0.68 0.72 0.71  GFRNONAA 97 106 >60 >60  GFRAA 112 123 >60 >60    LIVER FUNCTION TESTS: Recent Labs    10/01/16 0854 12/01/16 1625 04/27/17 0839 07/27/17 0851 08/24/17 1118  BILITOT 0.5 0.3 0.4 0.4 0.2*  AST 16 13 9* 15 10*  ALT 21 18 16 22 13   ALKPHOS 45  --   --   --  78  PROT 6.4 6.1 6.5 6.3 7.0  ALBUMIN 4.2  --   --   --  3.5    TUMOR MARKERS: No results for input(s): AFPTM, CEA, CA199, CHROMGRNA in the last 8760 hours.  Assessment and Plan: 42 y.o. female with history of endometrial carcinoma as well as right ovarian carcinoma diagnosed last month, status post surgery.  She also had remote colon cancer in 2001 with prior right hemicolectomy and chemotherapy.  She has had prior left chest wall Port-A-Cath placed by surgery but since removed.  She presents today for  new Port-A-Cath placement for chemotherapy.Risks and benefits of image guided port-a-catheter placement was discussed with the patient including, but not limited to bleeding, infection, pneumothorax, or fibrin sheath development  and need for additional procedures.  All of the patient's questions were answered, patient is agreeable to proceed. Consent signed and in chart.  LABS PENDING     Thank you for this interesting consult.  I greatly enjoyed meeting SUZETTA TIMKO and look forward to participating in their care.  A copy of this report was sent to the requesting provider on this date.  Electronically Signed: D. Rowe Robert, PA-C 09/26/2017, 12:42 PM   I spent a total of 20 minutes    in face to face in clinical consultation, greater than 50% of which was counseling/coordinating care for Port-A-Cath placement

## 2017-09-26 NOTE — Procedures (Signed)
Pre Procedure Dx: Endometrial Cancer Post Procedural Dx: Same  Successful placement of right IJ approach port-a-cath with tip at the superior caval atrial junction. The catheter is ready for immediate use.  Estimated Blood Loss: Minimal  Complications: None immediate.  Jay Rico Massar, MD Pager #: [phone]   

## 2017-09-26 NOTE — Discharge Instructions (Signed)
Implanted Port Insertion, Care After °This sheet gives you information about how to care for yourself after your procedure. Your health care provider may also give you more specific instructions. If you have problems or questions, contact your health care provider. °What can I expect after the procedure? °After your procedure, it is common to have: °· Discomfort at the port insertion site. °· Bruising on the skin over the port. This should improve over 3-4 days. ° °Follow these instructions at home: °Port care °· After your port is placed, you will get a manufacturer's information card. The card has information about your port. Keep this card with you at all times. °· Take care of the port as told by your health care provider. Ask your health care provider if you or a family member can get training for taking care of the port at home. A home health care nurse may also take care of the port. °· Make sure to remember what type of port you have. °Incision care °· Follow instructions from your health care provider about how to take care of your port insertion site. Make sure you: °? Wash your hands with soap and water before you change your bandage (dressing). If soap and water are not available, use hand sanitizer. °? Change your dressing as told by your health care provider. °? Leave stitches (sutures), skin glue, or adhesive strips in place. These skin closures may need to stay in place for 2 weeks or longer. If adhesive strip edges start to loosen and curl up, you may trim the loose edges. Do not remove adhesive strips completely unless your health care provider tells you to do that. °· Check your port insertion site every day for signs of infection. Check for: °? More redness, swelling, or pain. °? More fluid or blood. °? Warmth. °? Pus or a bad smell. °General instructions °· Do not take baths, swim, or use a hot tub until your health care provider approves. °· Do not lift anything that is heavier than 10 lb (4.5  kg) for a week, or as told by your health care provider. °· Ask your health care provider when it is okay to: °? Return to work or school. °? Resume usual physical activities or sports. °· Do not drive for 24 hours if you were given a medicine to help you relax (sedative). °· Take over-the-counter and prescription medicines only as told by your health care provider. °· Wear a medical alert bracelet in case of an emergency. This will tell any health care providers that you have a port. °· Keep all follow-up visits as told by your health care provider. This is important. °Contact a health care provider if: °· You cannot flush your port with saline as directed, or you cannot draw blood from the port. °· You have a fever or chills. °· You have more redness, swelling, or pain around your port insertion site. °· You have more fluid or blood coming from your port insertion site. °· Your port insertion site feels warm to the touch. °· You have pus or a bad smell coming from the port insertion site. °Get help right away if: °· You have chest pain or shortness of breath. °· You have bleeding from your port that you cannot control. °Summary °· Take care of the port as told by your health care provider. °· Change your dressing as told by your health care provider. °· Keep all follow-up visits as told by your health care provider. °  This information is not intended to replace advice given to you by your health care provider. Make sure you discuss any questions you have with your health care provider. °Document Released: 11/29/2012 Document Revised: 12/31/2015 Document Reviewed: 12/31/2015 °Elsevier Interactive Patient Education © 2017 [employer] °Moderate Conscious Sedation, Adult, Care After °These instructions provide you with information about caring for yourself after your procedure. Your health care provider may also give you more specific instructions. Your treatment has been planned according to current medical  practices, but problems sometimes occur. Call your health care provider if you have any problems or questions after your procedure. °What can I expect after the procedure? °After your procedure, it is common: °· To feel sleepy for several hours. °· To feel clumsy and have poor balance for several hours. °· To have poor judgment for several hours. °· To vomit if you eat too soon. ° °Follow these instructions at home: °For at least 24 hours after the procedure: ° °· Do not: °? Participate in activities where you could fall or become injured. °? Drive. °? Use heavy machinery. °? Drink alcohol. °? Take sleeping pills or medicines that cause drowsiness. °? Make important decisions or sign legal documents. °? Take care of children on your own. °· Rest. °Eating and drinking °· Follow the diet recommended by your health care provider. °· If you vomit: °? Drink water, juice, or soup when you can drink without vomiting. °? Make sure you have little or no nausea before eating solid foods. °General instructions °· Have a responsible adult stay with you until you are awake and alert. °· Take over-the-counter and prescription medicines only as told by your health care provider. °· If you smoke, do not smoke without supervision. °· Keep all follow-up visits as told by your health care provider. This is important. °Contact a health care provider if: °· You keep feeling nauseous or you keep vomiting. °· You feel light-headed. °· You develop a rash. °· You have a fever. °Get help right away if: °· You have trouble breathing. °This information is not intended to replace advice given to you by your health care provider. Make sure you discuss any questions you have with your health care provider. °Document Released: 11/29/2012 Document Revised: 07/14/2015 Document Reviewed: 05/31/2015 °Elsevier Interactive Patient Education © 2018 [employer] ° °

## 2017-09-27 ENCOUNTER — Encounter (HOSPITAL_COMMUNITY): Payer: Self-pay

## 2017-09-27 ENCOUNTER — Encounter: Payer: Self-pay | Admitting: Hematology and Oncology

## 2017-09-27 ENCOUNTER — Ambulatory Visit (HOSPITAL_COMMUNITY)
Admission: RE | Admit: 2017-09-27 | Discharge: 2017-09-27 | Disposition: A | Discharge status: Home / Self Care | Payer: BLUE CROSS/BLUE SHIELD | Source: Ambulatory Visit | Attending: Gynecologic Oncology | Admitting: Gynecologic Oncology

## 2017-09-27 ENCOUNTER — Inpatient Hospital Stay (HOSPITAL_BASED_OUTPATIENT_CLINIC_OR_DEPARTMENT_OTHER): Payer: BLUE CROSS/BLUE SHIELD | Admitting: Hematology and Oncology

## 2017-09-27 ENCOUNTER — Inpatient Hospital Stay: Payer: BLUE CROSS/BLUE SHIELD

## 2017-09-27 ENCOUNTER — Inpatient Hospital Stay: Payer: BLUE CROSS/BLUE SHIELD | Attending: Hematology and Oncology

## 2017-09-27 VITALS — BP 127/91 | HR 93 | Temp 98.3°F | Resp 18 | Ht 62.0 in | Wt 231.8 lb

## 2017-09-27 DIAGNOSIS — M898X9 Other specified disorders of bone, unspecified site: Secondary | ICD-10-CM | POA: Insufficient documentation

## 2017-09-27 DIAGNOSIS — N390 Urinary tract infection, site not specified: Secondary | ICD-10-CM | POA: Insufficient documentation

## 2017-09-27 DIAGNOSIS — C259 Malignant neoplasm of pancreas, unspecified: Secondary | ICD-10-CM | POA: Insufficient documentation

## 2017-09-27 DIAGNOSIS — C541 Malignant neoplasm of endometrium: Secondary | ICD-10-CM | POA: Insufficient documentation

## 2017-09-27 DIAGNOSIS — Z90710 Acquired absence of both cervix and uterus: Secondary | ICD-10-CM | POA: Insufficient documentation

## 2017-09-27 DIAGNOSIS — Z85038 Personal history of other malignant neoplasm of large intestine: Secondary | ICD-10-CM | POA: Diagnosis not present

## 2017-09-27 DIAGNOSIS — C561 Malignant neoplasm of right ovary: Secondary | ICD-10-CM

## 2017-09-27 DIAGNOSIS — Z87891 Personal history of nicotine dependence: Secondary | ICD-10-CM | POA: Diagnosis not present

## 2017-09-27 DIAGNOSIS — Z8614 Personal history of Methicillin resistant Staphylococcus aureus infection: Secondary | ICD-10-CM | POA: Diagnosis not present

## 2017-09-27 DIAGNOSIS — K869 Disease of pancreas, unspecified: Secondary | ICD-10-CM | POA: Diagnosis not present

## 2017-09-27 DIAGNOSIS — Z800 Family history of malignant neoplasm of digestive organs: Secondary | ICD-10-CM | POA: Insufficient documentation

## 2017-09-27 DIAGNOSIS — M0609 Rheumatoid arthritis without rheumatoid factor, multiple sites: Secondary | ICD-10-CM | POA: Diagnosis not present

## 2017-09-27 DIAGNOSIS — C7961 Secondary malignant neoplasm of right ovary: Secondary | ICD-10-CM | POA: Diagnosis not present

## 2017-09-27 DIAGNOSIS — Z8049 Family history of malignant neoplasm of other genital organs: Secondary | ICD-10-CM | POA: Insufficient documentation

## 2017-09-27 DIAGNOSIS — R319 Hematuria, unspecified: Secondary | ICD-10-CM | POA: Insufficient documentation

## 2017-09-27 DIAGNOSIS — Z90722 Acquired absence of ovaries, bilateral: Secondary | ICD-10-CM | POA: Insufficient documentation

## 2017-09-27 DIAGNOSIS — M069 Rheumatoid arthritis, unspecified: Secondary | ICD-10-CM | POA: Diagnosis not present

## 2017-09-27 DIAGNOSIS — D1803 Hemangioma of intra-abdominal structures: Secondary | ICD-10-CM | POA: Diagnosis not present

## 2017-09-27 DIAGNOSIS — Z5111 Encounter for antineoplastic chemotherapy: Secondary | ICD-10-CM | POA: Diagnosis not present

## 2017-09-27 DIAGNOSIS — Z7951 Long term (current) use of inhaled steroids: Secondary | ICD-10-CM | POA: Diagnosis not present

## 2017-09-27 DIAGNOSIS — G620 Drug-induced polyneuropathy: Secondary | ICD-10-CM | POA: Diagnosis not present

## 2017-09-27 DIAGNOSIS — M1990 Unspecified osteoarthritis, unspecified site: Secondary | ICD-10-CM | POA: Diagnosis not present

## 2017-09-27 DIAGNOSIS — Z7952 Long term (current) use of systemic steroids: Secondary | ICD-10-CM | POA: Diagnosis not present

## 2017-09-27 LAB — COMPREHENSIVE METABOLIC PANEL
ALT: 15 U/L (ref 0–44)
AST: 10 U/L — ABNORMAL LOW (ref 15–41)
Albumin: 3.6 g/dL (ref 3.5–5.0)
Alkaline Phosphatase: 45 U/L (ref 38–126)
Anion gap: 10 (ref 5–15)
BUN: 7 mg/dL (ref 6–20)
CO2: 26 mmol/L (ref 22–32)
Calcium: 9.2 mg/dL (ref 8.9–10.3)
Chloride: 104 mmol/L (ref 98–111)
Creatinine, Ser: 0.71 mg/dL (ref 0.44–1.00)
GFR calc Af Amer: >60 mL/min (ref >60)
GFR calc non Af Amer: >60 mL/min (ref >60)
Glucose, Bld: 112 mg/dL — ABNORMAL HIGH (ref 70–99)
Potassium: 4.3 mmol/L (ref 3.5–5.1)
Sodium: 140 mmol/L (ref 135–145)
Total Bilirubin: 0.3 mg/dL (ref 0.3–1.2)
Total Protein: 7.1 g/dL (ref 6.5–8.1)

## 2017-09-27 LAB — CBC WITH DIFFERENTIAL/PLATELET
Basophils Absolute: 0 10*3/uL (ref 0.0–0.1)
Basophils Relative: 0 %
Eosinophils Absolute: 0.1 10*3/uL (ref 0.0–0.5)
Eosinophils Relative: 1 %
HCT: 36.9 % (ref 34.8–46.6)
Hemoglobin: 11.2 g/dL — ABNORMAL LOW (ref 11.6–15.9)
Lymphocytes Relative: 10 %
Lymphs Abs: 0.8 10*3/uL — ABNORMAL LOW (ref 0.9–3.3)
MCH: 28.1 pg (ref 25.1–34.0)
MCHC: 30.4 g/dL — ABNORMAL LOW (ref 31.5–36.0)
MCV: 92.5 fL (ref 79.5–101.0)
Monocytes Absolute: 0.3 10*3/uL (ref 0.1–0.9)
Monocytes Relative: 4 %
Neutro Abs: 6.8 10*3/uL — ABNORMAL HIGH (ref 1.5–6.5)
Neutrophils Relative %: 85 %
Platelets: 244 10*3/uL (ref 145–400)
RBC: 3.99 MIL/uL (ref 3.70–5.45)
RDW: 14.4 % (ref 11.2–14.5)
WBC: 7.9 10*3/uL (ref 3.9–10.3)

## 2017-09-27 MED ORDER — LIDOCAINE-PRILOCAINE 2.5-2.5 % EX CREA: TOPICAL_CREAM | CUTANEOUS | 3 refills | Status: DC

## 2017-09-27 MED ORDER — IOHEXOL 300 MG/ML  SOLN
75.0000 mL | Freq: Once | INTRAMUSCULAR | Status: AC | PRN
  Administered 2017-09-27: 75 mL via INTRAVENOUS

## 2017-09-27 MED ORDER — PROCHLORPERAZINE MALEATE 10 MG PO TABS: 10.0000 mg | ORAL_TABLET | Freq: Four times a day (QID) | ORAL | 1 refills | Status: DC | PRN

## 2017-09-27 MED ORDER — ONDANSETRON HCL 8 MG PO TABS: 8.0000 mg | ORAL_TABLET | Freq: Three times a day (TID) | ORAL | 1 refills | Status: DC | PRN

## 2017-09-27 MED ORDER — DEXAMETHASONE 4 MG PO TABS: ORAL_TABLET | ORAL | 0 refills | Status: DC

## 2017-09-27 NOTE — Assessment & Plan Note (Signed)
She is advised to hold treatment while on chemotherapy

## 2017-09-27 NOTE — Assessment & Plan Note (Signed)
Recent EGD and colonoscopy were negative. Recent CEA level was normal

## 2017-09-27 NOTE — Progress Notes (Signed)
[place] OFFICE PROGRESS NOTE  Patient Care Team: [name], MD as PCP - General [facility])  ASSESSMENT & PLAN:  Secondary malignant neoplasm of right ovary [facility]) We reviewed the NCCN guidelines We discussed the role of chemotherapy. The intent is of curative intent.  We discussed some of the risks, benefits, side-effects of carboplatin & Taxol. Treatment is intravenous, every 3 weeks x 6 cycles  Some of the short term side-effects included, though not limited to, including weight loss, life threatening infections, risk of allergic reactions, need for transfusions of blood products, nausea, vomiting, change in bowel habits, loss of hair, admission to hospital for various reasons, and risks of death.   Long term side-effects are also discussed including risks of infertility, permanent damage to nerve function, hearing loss, chronic fatigue, kidney damage with possibility needing hemodialysis, and rare secondary malignancy including bone marrow disorders.  The patient is aware that the response rates discussed earlier is not guaranteed.  After a long discussion, patient made an informed decision to proceed with the prescribed plan of care.   Patient education material was dispensed. We discussed premedication with dexamethasone before chemotherapy. Due to her young age, I will not prescribe G-CSF support Due to concern about pancreatic lesion, she is obtaining second opinion at [facility] If chemotherapy needs to be delayed, I have advised her to call me We discussed tumor marker monitoring with CA-125; result from today is pending  Lesion of pancreas She has expressed significant concern over the pancreatic lesion We did review her baseline tumor marker a month ago was within normal limits She has appointment to see general surgery at [facility] in 2 days for further discussion about the role of biopsy now versus  to wait after 3 cycles of treatment I have spoken with the surgeon and discussed this in length There are some crossover activity of chemotherapy with carboplatin and Taxol that may treat the pancreatic lesion that served as neoadjuvant approach However, if the surgeon felt it is prudent to take care of the pancreatic lesion first, we will delay treatment  History of colon cancer Recent EGD and colonoscopy were negative. Recent CEA level was normal  Rheumatoid arthritis of multiple sites with negative rheumatoid factor ([place]) She is advised to hold treatment while on chemotherapy   Orders Placed This Encounter  Procedures  . CBC with Differential (Cancer Center Only)    Standing Status:   Standing    Number of Occurrences:   20    Standing Expiration Date:   09/28/2018  . CMP ([place] only)    Standing Status:   Standing    Number of Occurrences:   20    Standing Expiration Date:   09/28/2018    INTERVAL HISTORY: Please see below for problem oriented charting. She returns with her husband, due for further follow-up She tolerated port placement well She had numerous questions in regards to the pancreatic lesion seen and has obtained a consultation at [facility] in 2 days to address this She continues to have vague abdominal discomfort Denies nausea, vomiting or concerns for wound healing  SUMMARY OF ONCOLOGIC HISTORY: Oncology History   MSI positive  Endometrial :endometrioid Ovarian: Endometrioid      Endometrial cancer ([place])   08/18/2017 Imaging    Ct scan abdomen and pelvis 1. Mixed attenuation mass emanates from the right adnexa measuring 10.8 x 8.0 cm very suspicious for right ovarian  carcinoma. 2. Abnormality of the tail of the pancreas may be due to mild pancreatitis and small pseudocyst formation, but neoplasm cannot be excluded. 3. Small amount of ascites within abdomen and pelvis. 4. Small nonobstructing renal calculi bilaterally.        08/30/2017 Pathology Results    1. Uterus and cervix, with left fallopian tube - ENDOMETRIOID ADENOCARCINOMA, FIGO GRADE I, ARISING IN A BACKGROUND OF DIFFUSE COMPLEX ATYPICAL HYPERPLASIA. - CARCINOMA INVADES FOR OF DEPTH OF 0.2 CM WHERE THICKNESS OF MYOMETRIAL WALL IS 2.1 CM. - ALL RESECTION MARGINS ARE NEGATIVE FOR CARCINOMA. - NEGATIVE FOR LYMPHOVASCULAR OR PERINEURAL INVASION. - CERVICAL STROMA IS NOT INVOLVED. - SEE ONCOLOGY TABLE. - SEE NOTE 2. Ovary and fallopian tube, right - PRIMARY OVARIAN ENDOMETRIOID ADENOCARCINOMA, FIGO GRADE II, 12 CM. - THE OVARIAN SURFACE IS FOCALLY INVOLVED BY CARCINOMA. - NEGATIVE FOR LYMPHOVASCULAR INVASION. - BENIGN UNREMARKABLE FALLOPIAN TUBE, NEGATIVE FOR CARCINOMA. - SEE ONCOLOGY TABLE. - SEE NOTE 3. Cul-de-sac biopsy - METASTATIC ADENOCARCINOMA, MOST CONSISTENT WITH PRIMARY OVARIAN ENDOMETRIOID ADENOCARCINOMA. 4. Ovary, left - BENIGN UNREMARKABLE OVARY, NEGATIVE FOR MALIGNANCY. Microscopic Comment 1. UTERUS, CARCINOMA OR CARCINOSARCOMA Procedure: Total hysterectomy with bilateral salpingo-oophorectomy. Histologic type: Endometrioid adenocarcinoma. Histologic Grade: FIGO Grade I Myometrial invasion: Depth of invasion: 2 mm Myometrial thickness: 21 mm Uterine Serosa Involvement: Not identified Cervical stromal involvement: Not identified Extent of involvement of other organs: Not applicable Lymphovascular invasion: Not identified Regional Lymph Nodes: Examined: 0 Sentinel 0 Non-sentinel 0 Total Tumor block for ancillary studies: 1H MMR / MSI testing: Pending Pathologic Stage Classification (pTNM, AJCC 8th edition): pT1a, pNX (v4.1.0.0) 2. OVARY or FALLOPIAN TUBE or PRIMARY PERITONEUM: Procedure: Salpingo-oophorectomy Specimen Integrity: Intact Tumor Site: Right ovary Ovarian Surface Involvement (required only if applicable): Focally involved by carcinoma Fallopian Tube Surface Involvement (required only if applicable): Not  identified Tumor Size: 12 cm Histologic Type: Endometrioid adenocarcinoma Histologic Grade: Grade II Implants (required for advanced stage serous/seromucinous borderline tumors only): Not applicable Other Tissue/ Organ Involvement: Cul de sac biopsy involved by tumor Largest Extrapelvic Peritoneal Focus (required only if applicable): Not applicable Peritoneal/Ascitic Fluid: Negative for carcinoma (case # [id number]) Treatment Effect (required only for high-grade serous carcinomas): Not applicable Regional Lymph Nodes: No lymph nodes submitted or found Number of Lymph Nodes Examined: 0 Pathologic Stage Classification (pTNM, AJCC 8th Edition): pT2b, pN0 Representative Tumor Block: 2B and 2E 1. Molecular study for microsatellite instability and immunohistochemical stains for MMR-related proteins are pending and will be reported in an addendum. 2. Immunohistochemical stain show that the ovarian tumor is positive for CK7 and PAX8 (both diffuse), CDX2 (patchy and weak); and negative for CK20. This immunoprofile is consistent with the above diagnosis. Dr. Lyndon Code has reviewed this case and concurs with the above diagnosis. Molecular study for microsatellite instability and immunohistochemical stains for MMR-related proteins are pending and will be reported in an addendum      08/30/2017 Genetic Testing    Patient has genetic testing done for MSI  Results revealed patient has the following mutation(s): loss of [facility] 2      08/30/2017 Surgery    Surgeon: Mart Piggs, MD Pre-operative Diagnosis:  1. Adnexal mass 2. Abnormal uterine bleeding 3. H/o Cecal CA  Post-operative Diagnosis:  1. Adhesive disease post colon resection 2. Endometrial cancer NOS 3. Adenocarcinoma unknown origin, right ovary, suspicious for GI primary  Operation:  1. Lysis of adhesions ~30 minutes 2. Robotic-assisted laparoscopic total hysterectomy with right salpingo-oophorectomy and left salpingectomy 3. Left  oophorectomy (RA-laparoscopic) 4.  Pelvic washings  Findings: Adhesions of omentum to anterior abdominal wall. Enlarged cystic right ovary ~10cm. Uterus had small nodules on serosa near where right adnexa was intimate with the surface. No obvious intraoperative rupture of cyst, although in 2 areas the wall was thin and one of these areas had some bleeding. Slight scarring of left bladder dome to LUS/cervix. Uterus on frozen section c/w hyperplasia and a small focus of endometrial CA - no myo invasion, <2cm in size. Frozen section on the right adnexa was carcinoma, met from colon or possibly Gyn, favor GI primary, defer to permanent. Left ovary was WNL.        09/15/2017 Cancer Staging    Staging form: Corpus Uteri - Carcinoma and Carcinosarcoma, AJCC 8th Edition - Pathologic: FIGO Stage IA (pT1a, pN0, cM0) - Signed by Heath Lark, MD on 09/15/2017      09/26/2017 Procedure    Successful placement of a right internal jugular approach power injectable Port-A-Cath. The catheter is ready for immediate use.       Secondary malignant neoplasm of right ovary ([place])   08/23/2017 Tumor Marker    Patient's tumor was tested for the following markers: CA-125 Results of the tumor marker test revealed 139.8      08/31/2017 Initial Diagnosis    Secondary malignant neoplasm of right ovary ([place])      09/15/2017 Cancer Staging    Staging form: Ovary, Fallopian Tube, and Primary Peritoneal Carcinoma, AJCC 8th Edition - Pathologic: Stage IIB (pT2b, pN0, cM0) - Signed by Heath Lark, MD on 09/15/2017      09/27/2017 Imaging    No evidence of metastatic disease or other acute findings within the thorax.  4 cm low-attenuation mass in pancreatic tail, highly suspicious for pancreatic carcinoma. This is caused splenic vein thrombosis, with new venous collaterals in the left upper quadrant. Consider endoscopic ultrasound with FNA for tissue diagnosis.  Stable benign hepatic hemangioma.        REVIEW OF  SYSTEMS:   Constitutional: Denies fevers, chills or abnormal weight loss Eyes: Denies blurriness of vision Ears, nose, mouth, throat, and face: Denies mucositis or sore throat Respiratory: Denies cough, dyspnea or wheezes Cardiovascular: Denies palpitation, chest discomfort or lower extremity swelling Gastrointestinal:  Denies nausea, heartburn or change in bowel habits Skin: Denies abnormal skin rashes Lymphatics: Denies new lymphadenopathy or easy bruising Neurological:Denies numbness, tingling or new weaknesses Behavioral/Psych: Mood is stable, no new changes  All other systems were reviewed with the patient and are negative.  I have reviewed the past medical history, past surgical history, social history and family history with the patient and they are unchanged from previous note.  ALLERGIES:  is allergic to codeine; penicillins; and bactrim [sulfamethoxazole-trimethoprim].  MEDICATIONS:  Current Outpatient Medications  Medication Sig Dispense Refill  . Abatacept (ORENCIA CLICKJECT [place]) Inject into the skin.    Marland Kitchen acetaminophen (TYLENOL) 650 MG CR tablet Take 1,300 mg by mouth every 8 (eight) hours.    Marland Kitchen dexamethasone (DECADRON) 4 MG tablet Take 5 tabs at the night before and 5 tab the morning of chemotherapy, every 3 weeks, by mouth 60 tablet 0  . diclofenac sodium (VOLTAREN) 1 % GEL APPLY 3 GM TO AFFECTED 3 LARGE JOINTS UP TO 3 TIMES A DAY AS NEEDED AS DIRECTED 300 g 1  . Fe Fum-FePoly-Vit C-Vit B3 (INTEGRA) 62.5-62.5-40-3 MG CAPS Take 1 tablet by mouth daily.  2  . fexofenadine (ALLEGRA) 180 MG tablet Take 180 mg by mouth daily.    Marland Kitchen  fluticasone (FLONASE) 50 MCG/ACT nasal spray Place 2 sprays into both nostrils daily.    Marland Kitchen ibuprofen (ADVIL,MOTRIN) 600 MG tablet Take 1 tablet (600 mg total) by mouth every 6 (six) hours. 30 tablet 0  . lidocaine-prilocaine (EMLA) cream Apply to affected area once 30 g 3  . omeprazole (PRILOSEC) 20 MG capsule Take 20 mg by mouth daily.    .  ondansetron (ZOFRAN) 8 MG tablet Take 1 tablet (8 mg total) by mouth every 8 (eight) hours as needed for refractory nausea / vomiting. Start on day 3 after chemo. 30 tablet 1  . prednisoLONE acetate (PRED FORTE) 1 % ophthalmic suspension Place 1 drop into both eyes every 4 (four) hours as needed (while awake).     . predniSONE (DELTASONE) 10 MG tablet TAKE 1 TABLET BY MOUTH EVERY DAY WITH BREAKFAST 30 tablet 0  . prochlorperazine (COMPAZINE) 10 MG tablet Take 1 tablet (10 mg total) by mouth every 6 (six) hours as needed (Nausea or vomiting). 30 tablet 1  . simethicone (MYLICON) 007 MG chewable tablet Chew 125 mg by mouth every 6 (six) hours as needed for flatulence.    Marland Kitchen UNABLE TO FIND Patient receives allergy injection @@ [place] allergy and asthma both arms last dose 08-14-17     No current facility-administered medications for this visit.     PHYSICAL EXAMINATION: ECOG PERFORMANCE STATUS: 1 - Symptomatic but completely ambulatory  Vitals:   09/27/17 1335  BP: (!) 127/91  Pulse: 93  Resp: 18  Temp: 98.3 F (36.8 C)  SpO2: 98%   Filed Weights   09/27/17 1335  Weight: 231 lb 12.8 oz (105.1 kg)    GENERAL:alert, no distress and comfortable SKIN: skin color, texture, turgor are normal, no rashes or significant lesions EYES: normal, Conjunctiva are pink and non-injected, sclera clear OROPHARYNX:no exudate, no erythema and lips, buccal mucosa, and tongue normal  NECK: supple, thyroid normal size, non-tender, without nodularity LYMPH:  no palpable lymphadenopathy in the cervical, axillary or inguinal LUNGS: clear to auscultation and percussion with normal breathing effort HEART: regular rate & rhythm and no murmurs and no lower extremity edema ABDOMEN:abdomen soft, non-tender and normal bowel sounds Musculoskeletal:no cyanosis of digits and no clubbing  NEURO: alert & oriented x 3 with fluent speech, no focal motor/sensory deficits  LABORATORY DATA:  I have reviewed the data as  listed    Component Value Date/Time   NA 140 09/27/2017 1054   K 4.3 09/27/2017 1054   CL 104 09/27/2017 1054   CO2 26 09/27/2017 1054   GLUCOSE 112 (H) 09/27/2017 1054   BUN 7 09/27/2017 1054   CREATININE 0.71 09/27/2017 1054   CREATININE 0.68 07/27/2017 0851   CALCIUM 9.2 09/27/2017 1054   PROT 7.1 09/27/2017 1054   ALBUMIN 3.6 09/27/2017 1054   AST 10 (L) 09/27/2017 1054   ALT 15 09/27/2017 1054   ALKPHOS 45 09/27/2017 1054   BILITOT 0.3 09/27/2017 1054   GFRNONAA >60 09/27/2017 1054   GFRNONAA 106 07/27/2017 0851   GFRAA >60 09/27/2017 1054   GFRAA 123 07/27/2017 0851    No results found for: SPEP, UPEP  Lab Results  Component Value Date   WBC 7.9 09/27/2017   NEUTROABS 6.8 (H) 09/27/2017   HGB 11.2 (L) 09/27/2017   HCT 36.9 09/27/2017   MCV 92.5 09/27/2017   PLT 244 09/27/2017      Chemistry      Component Value Date/Time   NA 140 09/27/2017 1054   K 4.3  09/27/2017 1054   CL 104 09/27/2017 1054   CO2 26 09/27/2017 1054   BUN 7 09/27/2017 1054   CREATININE 0.71 09/27/2017 1054   CREATININE 0.68 07/27/2017 0851      Component Value Date/Time   CALCIUM 9.2 09/27/2017 1054   ALKPHOS 45 09/27/2017 1054   AST 10 (L) 09/27/2017 1054   ALT 15 09/27/2017 1054   BILITOT 0.3 09/27/2017 1054       RADIOGRAPHIC STUDIES: I have reviewed imaging study with the patient and her husband I have personally reviewed the radiological images as listed and agreed with the findings in the report. Ct Chest W Contrast  Result Date: 09/27/2017 CLINICAL DATA:  Newly diagnosed right ovarian carcinoma.  Staging. EXAM: CT CHEST WITH CONTRAST TECHNIQUE: Multidetector CT imaging of the chest was performed during intravenous contrast administration. CONTRAST:  64m OMNIPAQUE IOHEXOL 300 MG/ML  SOLN COMPARISON:  Abdomen MRI on 09/16/2017, and chest CT on 01/16/2007 FINDINGS: Cardiovascular:  No acute findings. Mediastinum/Nodes: No masses or pathologically enlarged lymph nodes  identified. Lungs/Pleura: No pulmonary infiltrate or mass identified. No effusion present. Upper Abdomen: Stable benign hemangioma in the left hepatic lobe measuring approximately 3 cm. Low-attenuation mass in the pancreatic tail measures 4.1 x 3.2 cm, as seen on recent MRI, highly suspicious for pancreatic carcinoma. This has caused splenic vein thrombosis with new venous collaterals in the left upper quadrant. Musculoskeletal:  No suspicious bone lesions. IMPRESSION: No evidence of metastatic disease or other acute findings within the thorax. 4 cm low-attenuation mass in pancreatic tail, highly suspicious for pancreatic carcinoma. This is caused splenic vein thrombosis, with new venous collaterals in the left upper quadrant. Consider endoscopic ultrasound with FNA for tissue diagnosis. Stable benign hepatic hemangioma. Electronically Signed   By: JEarle GellM.D.   On: 09/27/2017 14:40   Mr Abdomen Wwo Contrast  Result Date: 09/16/2017 CLINICAL DATA:  Pancreatic lesion. EXAM: MRI ABDOMEN WITHOUT AND WITH CONTRAST TECHNIQUE: Multiplanar multisequence MR imaging of the abdomen was performed both before and after the administration of intravenous contrast. CONTRAST:  264mMULTIHANCE GADOBENATE DIMEGLUMINE 529 MG/ML IV SOLN COMPARISON:  CT 08/18/2017 FINDINGS: Lower chest:  Lung bases are clear Hepatobiliary: Peripherally enhancing lesion in the superior central RIGHT hepatic lobe measuring 2.6 cm is most consistent benign hemangioma. No additional hepatic lesions are present. Normal gallbladder. Normal common bile duct. Gallbladder is noninflamed with a large gallstone measuring 15 mm. Pancreas: There is a complex cystic and solid lesion in the tail the pancreas measuring 3.0 x 1.8 cm compared to 2.9 x 2.1 cm on comparison CT. This lesion is hyperintense on noncontrast T1 weighted imaging and demonstrates hypoenhancement on postcontrast imaging (series 17). Lesions mixed signal intensity on T2 weighted imaging.  There is no communication with the pancreatic duct. There is mild inflammation in the peripancreatic fat at this level. No organized fluid collections. The more distal body and head of the pancreas is normal. There is no downstream pancreatic duct dilatation. Spleen: Normal spleen. Adrenals/urinary tract: Adrenal glands and kidneys are normal. Stomach/Bowel: Stomach and limited of the small bowel is unremarkable Vascular/Lymphatic: Abdominal aortic normal caliber. No retroperitoneal periportal lymphadenopathy. Musculoskeletal: No aggressive osseous lesion IMPRESSION: 1. Mixed cystic and solid hypoenhancing lesion in the tail the pancreas is not improved from CT 07/29/2017. Differential includes focal pancreatitis versus pancreatic neoplasm. With no improvement in 1 month time and atypical / irregular imaging features, recommend endoscopic ultrasound and potential tissue sampling. This lesion may be assessable from the stomach. 2.  Solitary gallstone within normal gallbladder. No normal biliary tree. 3. Benign hemangioma in the RIGHT hepatic lobe. These results will be called to the ordering clinician or representative by the Radiologist Assistant, and communication documented in the PACS or zVision Dashboard. Electronically Signed   By: Suzy Bouchard M.D.   On: 09/16/2017 13:06   Ir Imaging Guided Port Insertion  Result Date: 09/26/2017 INDICATION: History of endometrial and ovarian cancer, in need of durable intravenous access for chemotherapy administration. EXAM: IMPLANTED PORT A CATH PLACEMENT WITH ULTRASOUND AND FLUOROSCOPIC GUIDANCE COMPARISON:  None. MEDICATIONS: Clindamycin 600 mg IV; The antibiotic was administered within an appropriate time interval prior to skin puncture. ANESTHESIA/SEDATION: Moderate (conscious) sedation was employed during this procedure. A total of Versed 4 mg and Fentanyl 100 mcg was administered intravenously. Moderate Sedation Time: 27 minutes. The patient's level of  consciousness and vital signs were monitored continuously by radiology nursing throughout the procedure under my direct supervision. CONTRAST:  None FLUOROSCOPY TIME:  18 seconds (5 mGy) COMPLICATIONS: None immediate. PROCEDURE: The procedure, risks, benefits, and alternatives were explained to the patient. Questions regarding the procedure were encouraged and answered. The patient understands and consents to the procedure. The right neck and chest were prepped with chlorhexidine in a sterile fashion, and a sterile drape was applied covering the operative field. Maximum barrier sterile technique with sterile gowns and gloves were used for the procedure. A timeout was performed prior to the initiation of the procedure. Local anesthesia was provided with 1% lidocaine with epinephrine. After creating a small venotomy incision, a micropuncture kit was utilized to access the internal jugular vein. Real-time ultrasound guidance was utilized for vascular access including the acquisition of a permanent ultrasound image documenting patency of the accessed vessel. The microwire was utilized to measure appropriate catheter length. A subcutaneous port pocket was then created along the upper chest wall utilizing a combination of sharp and blunt dissection. The pocket was irrigated with sterile saline. A single lumen power injectable port was chosen for placement. The 8 Fr catheter was tunneled from the port pocket site to the venotomy incision. The port was placed in the pocket. The external catheter was trimmed to appropriate length. At the venotomy, an 8 Fr peel-away sheath was placed over a guidewire under fluoroscopic guidance. The catheter was then placed through the sheath and the sheath was removed. Final catheter positioning was confirmed and documented with a fluoroscopic spot radiograph. The port was accessed with a Huber needle, aspirated and flushed with heparinized saline. The venotomy site was closed with an  interrupted 4-0 Vicryl suture. The port pocket incision was closed with interrupted 2-0 Vicryl suture and the skin was opposed with a running subcuticular 4-0 Vicryl suture. Dermabond and Steri-strips were applied to both incisions. Dressings were placed. The patient tolerated the procedure well without immediate post procedural complication. FINDINGS: After catheter placement, the tip lies within the superior cavoatrial junction. The catheter aspirates and flushes normally and is ready for immediate use. IMPRESSION: Successful placement of a right internal jugular approach power injectable Port-A-Cath. The catheter is ready for immediate use. Electronically Signed   By: Sandi Mariscal M.D.   On: 09/26/2017 17:11    All questions were answered. The patient knows to call the clinic with any problems, questions or concerns. No barriers to learning was detected.  I spent 40 minutes counseling the patient face to face. The total time spent in the appointment was 55 minutes and more than 50% was on counseling and  review of test results  Heath Lark, MD 09/27/2017 4:52 PM

## 2017-09-27 NOTE — Assessment & Plan Note (Addendum)
We reviewed the NCCN guidelines We discussed the role of chemotherapy. The intent is of curative intent.  We discussed some of the risks, benefits, side-effects of carboplatin & Taxol. Treatment is intravenous, every 3 weeks x 6 cycles  Some of the short term side-effects included, though not limited to, including weight loss, life threatening infections, risk of allergic reactions, need for transfusions of blood products, nausea, vomiting, change in bowel habits, loss of hair, admission to hospital for various reasons, and risks of death.   Long term side-effects are also discussed including risks of infertility, permanent damage to nerve function, hearing loss, chronic fatigue, kidney damage with possibility needing hemodialysis, and rare secondary malignancy including bone marrow disorders.  The patient is aware that the response rates discussed earlier is not guaranteed.  After a long discussion, patient made an informed decision to proceed with the prescribed plan of care.   Patient education material was dispensed. We discussed premedication with dexamethasone before chemotherapy. Due to her young age, I will not prescribe G-CSF support Due to concern about pancreatic lesion, she is obtaining second opinion at [facility] If chemotherapy needs to be delayed, I have advised her to call me We discussed tumor marker monitoring with CA-125; result from today is pending

## 2017-09-27 NOTE — Assessment & Plan Note (Addendum)
She has expressed significant concern over the pancreatic lesion We did review her baseline tumor marker a month ago was within normal limits She has appointment to see general surgery at [facility] in 2 days for further discussion about the role of biopsy now versus to wait after 3 cycles of treatment I have spoken with the surgeon and discussed this in length There are some crossover activity of chemotherapy with carboplatin and Taxol that may treat the pancreatic lesion that served as neoadjuvant approach However, if the surgeon felt it is prudent to take care of the pancreatic lesion first, we will delay treatment

## 2017-09-28 ENCOUNTER — Telehealth: Payer: Self-pay | Admitting: *Deleted

## 2017-09-28 LAB — CA 125: Cancer Antigen (CA) 125: 39.4 U/mL — ABNORMAL HIGH (ref 0.0–38.1)

## 2017-09-28 NOTE — Telephone Encounter (Addendum)
  Notified of message below  ----- Message from Heath Lark, MD sent at 09/28/2017  9:04 AM EDT ----- Regarding: Labs pls let her know CA-125 just a bit higher than upper limit ----- Message ----- From: Interface, Lab In [place] Sent: 09/27/2017  11:08 AM To: Heath Lark, MD

## 2017-09-29 DIAGNOSIS — K862 Cyst of pancreas: Secondary | ICD-10-CM | POA: Diagnosis not present

## 2017-09-29 DIAGNOSIS — K869 Disease of pancreas, unspecified: Secondary | ICD-10-CM | POA: Diagnosis not present

## 2017-09-29 DIAGNOSIS — Z9221 Personal history of antineoplastic chemotherapy: Secondary | ICD-10-CM | POA: Diagnosis not present

## 2017-09-29 DIAGNOSIS — C182 Malignant neoplasm of ascending colon: Secondary | ICD-10-CM | POA: Diagnosis not present

## 2017-09-29 DIAGNOSIS — Z9079 Acquired absence of other genital organ(s): Secondary | ICD-10-CM | POA: Diagnosis not present

## 2017-09-29 DIAGNOSIS — C541 Malignant neoplasm of endometrium: Secondary | ICD-10-CM | POA: Diagnosis not present

## 2017-09-29 DIAGNOSIS — E669 Obesity, unspecified: Secondary | ICD-10-CM | POA: Diagnosis not present

## 2017-09-29 DIAGNOSIS — C569 Malignant neoplasm of unspecified ovary: Secondary | ICD-10-CM | POA: Diagnosis not present

## 2017-09-29 DIAGNOSIS — Z9049 Acquired absence of other specified parts of digestive tract: Secondary | ICD-10-CM | POA: Diagnosis not present

## 2017-09-29 DIAGNOSIS — Z85038 Personal history of other malignant neoplasm of large intestine: Secondary | ICD-10-CM | POA: Diagnosis not present

## 2017-09-29 DIAGNOSIS — M069 Rheumatoid arthritis, unspecified: Secondary | ICD-10-CM | POA: Diagnosis not present

## 2017-09-29 DIAGNOSIS — Z90710 Acquired absence of both cervix and uterus: Secondary | ICD-10-CM | POA: Diagnosis not present

## 2017-09-29 DIAGNOSIS — Z90722 Acquired absence of ovaries, bilateral: Secondary | ICD-10-CM | POA: Diagnosis not present

## 2017-09-29 DIAGNOSIS — Z6841 Body Mass Index (BMI) 40.0 and over, adult: Secondary | ICD-10-CM | POA: Diagnosis not present

## 2017-09-29 DIAGNOSIS — K8080 Other cholelithiasis without obstruction: Secondary | ICD-10-CM | POA: Diagnosis not present

## 2017-09-29 DIAGNOSIS — Z79899 Other long term (current) drug therapy: Secondary | ICD-10-CM | POA: Diagnosis not present

## 2017-09-30 ENCOUNTER — Inpatient Hospital Stay: Payer: BLUE CROSS/BLUE SHIELD

## 2017-09-30 VITALS — BP 125/74 | HR 70 | Temp 99.0°F | Resp 16

## 2017-09-30 DIAGNOSIS — M898X9 Other specified disorders of bone, unspecified site: Secondary | ICD-10-CM | POA: Diagnosis not present

## 2017-09-30 DIAGNOSIS — C7961 Secondary malignant neoplasm of right ovary: Secondary | ICD-10-CM

## 2017-09-30 DIAGNOSIS — Z5111 Encounter for antineoplastic chemotherapy: Secondary | ICD-10-CM | POA: Diagnosis not present

## 2017-09-30 DIAGNOSIS — C259 Malignant neoplasm of pancreas, unspecified: Secondary | ICD-10-CM | POA: Diagnosis not present

## 2017-09-30 DIAGNOSIS — K869 Disease of pancreas, unspecified: Secondary | ICD-10-CM | POA: Diagnosis not present

## 2017-09-30 DIAGNOSIS — M0609 Rheumatoid arthritis without rheumatoid factor, multiple sites: Secondary | ICD-10-CM | POA: Diagnosis not present

## 2017-09-30 DIAGNOSIS — R319 Hematuria, unspecified: Secondary | ICD-10-CM | POA: Diagnosis not present

## 2017-09-30 DIAGNOSIS — Z87891 Personal history of nicotine dependence: Secondary | ICD-10-CM | POA: Diagnosis not present

## 2017-09-30 DIAGNOSIS — Z800 Family history of malignant neoplasm of digestive organs: Secondary | ICD-10-CM | POA: Diagnosis not present

## 2017-09-30 DIAGNOSIS — Z85038 Personal history of other malignant neoplasm of large intestine: Secondary | ICD-10-CM | POA: Diagnosis not present

## 2017-09-30 DIAGNOSIS — G620 Drug-induced polyneuropathy: Secondary | ICD-10-CM | POA: Diagnosis not present

## 2017-09-30 DIAGNOSIS — C541 Malignant neoplasm of endometrium: Secondary | ICD-10-CM | POA: Diagnosis not present

## 2017-09-30 DIAGNOSIS — N390 Urinary tract infection, site not specified: Secondary | ICD-10-CM | POA: Diagnosis not present

## 2017-09-30 DIAGNOSIS — Z8049 Family history of malignant neoplasm of other genital organs: Secondary | ICD-10-CM | POA: Diagnosis not present

## 2017-09-30 MED ORDER — PALONOSETRON HCL INJECTION 0.25 MG/5ML
INTRAVENOUS | Status: AC
  Filled 2017-09-30: qty 5

## 2017-09-30 MED ORDER — FAMOTIDINE IN NACL 20-0.9 MG/50ML-% IV SOLN
INTRAVENOUS | Status: AC
  Filled 2017-09-30: qty 50

## 2017-09-30 MED ORDER — SODIUM CHLORIDE 0.9 % IV SOLN
Freq: Once | INTRAVENOUS | Status: AC
  Administered 2017-09-30: 11:00:00 via INTRAVENOUS
  Filled 2017-09-30: qty 250

## 2017-09-30 MED ORDER — FAMOTIDINE IN NACL 20-0.9 MG/50ML-% IV SOLN
20.0000 mg | Freq: Once | INTRAVENOUS | Status: AC
  Administered 2017-09-30: 20 mg via INTRAVENOUS

## 2017-09-30 MED ORDER — DIPHENHYDRAMINE HCL 50 MG/ML IJ SOLN
INTRAMUSCULAR | Status: AC
  Filled 2017-09-30: qty 1

## 2017-09-30 MED ORDER — PACLITAXEL CHEMO INJECTION 300 MG/50ML
175.0000 mg/m2 | Freq: Once | INTRAVENOUS | Status: AC
  Administered 2017-09-30: 378 mg via INTRAVENOUS
  Filled 2017-09-30: qty 63

## 2017-09-30 MED ORDER — SODIUM CHLORIDE 0.9 % IV SOLN
Freq: Once | INTRAVENOUS | Status: AC
  Administered 2017-09-30: 11:00:00 via INTRAVENOUS
  Filled 2017-09-30: qty 5

## 2017-09-30 MED ORDER — DIPHENHYDRAMINE HCL 50 MG/ML IJ SOLN
50.0000 mg | Freq: Once | INTRAMUSCULAR | Status: AC
  Administered 2017-09-30: 50 mg via INTRAVENOUS

## 2017-09-30 MED ORDER — SODIUM CHLORIDE 0.9% FLUSH
10.0000 mL | INTRAVENOUS | Status: DC | PRN
  Administered 2017-09-30: 10 mL
  Filled 2017-09-30: qty 10

## 2017-09-30 MED ORDER — PALONOSETRON HCL INJECTION 0.25 MG/5ML
0.2500 mg | Freq: Once | INTRAVENOUS | Status: AC
  Administered 2017-09-30: 0.25 mg via INTRAVENOUS

## 2017-09-30 MED ORDER — HEPARIN SOD (PORK) LOCK FLUSH 100 UNIT/ML IV SOLN
500.0000 [IU] | Freq: Once | INTRAVENOUS | Status: AC | PRN
  Administered 2017-09-30: 500 [IU]
  Filled 2017-09-30: qty 5

## 2017-09-30 MED ORDER — SODIUM CHLORIDE 0.9 % IV SOLN
750.0000 mg | Freq: Once | INTRAVENOUS | Status: AC
  Administered 2017-09-30: 750 mg via INTRAVENOUS
  Filled 2017-09-30: qty 75

## 2017-09-30 NOTE — Patient Instructions (Signed)
[facility] Discharge Instructions for Patients Receiving Chemotherapy  Today you received the following chemotherapy agents paclitaxol (Taxol) and carboplatin (Paraplatin).   To help prevent nausea and vomiting after your treatment, we encourage you to take your nausea medication as directed by your physician. If you develop nausea and vomiting that is not controlled by your nausea medication, call the clinic.   BELOW ARE SYMPTOMS THAT SHOULD BE REPORTED IMMEDIATELY:  *FEVER GREATER THAN 100.5 F  *CHILLS WITH OR WITHOUT FEVER  NAUSEA AND VOMITING THAT IS NOT CONTROLLED WITH YOUR NAUSEA MEDICATION  *UNUSUAL SHORTNESS OF BREATH  *UNUSUAL BRUISING OR BLEEDING  TENDERNESS IN MOUTH AND THROAT WITH OR WITHOUT PRESENCE OF ULCERS  *URINARY PROBLEMS  *BOWEL PROBLEMS  UNUSUAL RASH Items with * indicate a potential emergency and should be followed up as soon as possible.  Feel free to call the clinic should you have any questions or concerns. The clinic phone number is [phone].  Please show the [place] at check-in to the Emergency Department and triage nurse.    Paclitaxel injection What is this medicine? PACLITAXEL (PAK li TAX el) is a chemotherapy drug. It targets fast dividing cells, like cancer cells, and causes these cells to die. This medicine is used to treat ovarian cancer, breast cancer, and other cancers. This medicine may be used for other purposes; ask your health care provider or pharmacist if you have questions. COMMON BRAND NAME(S): Onxol, Taxol What should I tell my health care provider before I take this medicine? They need to know if you have any of these conditions: -blood disorders -irregular heartbeat -infection (especially a virus infection such as chickenpox, cold sores, or herpes) -liver disease -previous or ongoing radiation therapy -an unusual or allergic reaction to paclitaxel, alcohol, polyoxyethylated castor oil, other  chemotherapy agents, other medicines, foods, dyes, or preservatives -pregnant or trying to get pregnant -breast-feeding How should I use this medicine? This drug is given as an infusion into a vein. It is administered in a hospital or clinic by a specially trained health care professional. Talk to your pediatrician regarding the use of this medicine in children. Special care may be needed. Overdosage: If you think you have taken too much of this medicine contact a poison control center or emergency room at once. NOTE: This medicine is only for you. Do not share this medicine with others. What if I miss a dose? It is important not to miss your dose. Call your doctor or health care professional if you are unable to keep an appointment. What may interact with this medicine? Do not take this medicine with any of the following medications: -disulfiram -metronidazole This medicine may also interact with the following medications: -cyclosporine -diazepam -ketoconazole -medicines to increase blood counts like filgrastim, pegfilgrastim, sargramostim -other chemotherapy drugs like cisplatin, doxorubicin, epirubicin, etoposide, teniposide, vincristine -quinidine -testosterone -vaccines -verapamil Talk to your doctor or health care professional before taking any of these medicines: -acetaminophen -aspirin -ibuprofen -ketoprofen -naproxen This list may not describe all possible interactions. Give your health care provider a list of all the medicines, herbs, non-prescription drugs, or dietary supplements you use. Also tell them if you smoke, drink alcohol, or use illegal drugs. Some items may interact with your medicine. What should I watch for while using this medicine? Your condition will be monitored carefully while you are receiving this medicine. You will need important blood work done while you are taking this medicine. This medicine can cause serious allergic reactions. To  reduce your risk  you will need to take other medicine(s) before treatment with this medicine. If you experience allergic reactions like skin rash, itching or hives, swelling of the face, lips, or tongue, tell your doctor or health care professional right away. In some cases, you may be given additional medicines to help with side effects. Follow all directions for their use. This drug may make you feel generally unwell. This is not uncommon, as chemotherapy can affect healthy cells as well as cancer cells. Report any side effects. Continue your course of treatment even though you feel ill unless your doctor tells you to stop. Call your doctor or health care professional for advice if you get a fever, chills or sore throat, or other symptoms of a cold or flu. Do not treat yourself. This drug decreases your body's ability to fight infections. Try to avoid being around people who are sick. This medicine may increase your risk to bruise or bleed. Call your doctor or health care professional if you notice any unusual bleeding. Be careful brushing and flossing your teeth or using a toothpick because you may get an infection or bleed more easily. If you have any dental work done, tell your dentist you are receiving this medicine. Avoid taking products that contain aspirin, acetaminophen, ibuprofen, naproxen, or ketoprofen unless instructed by your doctor. These medicines may hide a fever. Do not become pregnant while taking this medicine. Women should inform their doctor if they wish to become pregnant or think they might be pregnant. There is a potential for serious side effects to an unborn child. Talk to your health care professional or pharmacist for more information. Do not breast-feed an infant while taking this medicine. Men are advised not to father a child while receiving this medicine. This product may contain alcohol. Ask your pharmacist or healthcare provider if this medicine contains alcohol. Be sure to tell all  healthcare providers you are taking this medicine. Certain medicines, like metronidazole and disulfiram, can cause an unpleasant reaction when taken with alcohol. The reaction includes flushing, headache, nausea, vomiting, sweating, and increased thirst. The reaction can last from 30 minutes to several hours. What side effects may I notice from receiving this medicine? Side effects that you should report to your doctor or health care professional as soon as possible: -allergic reactions like skin rash, itching or hives, swelling of the face, lips, or tongue -low blood counts - This drug may decrease the number of white blood cells, red blood cells and platelets. You may be at increased risk for infections and bleeding. -signs of infection - fever or chills, cough, sore throat, pain or difficulty passing urine -signs of decreased platelets or bleeding - bruising, pinpoint red spots on the skin, black, tarry stools, nosebleeds -signs of decreased red blood cells - unusually weak or tired, fainting spells, lightheadedness -breathing problems -chest pain -high or low blood pressure -mouth sores -nausea and vomiting -pain, swelling, redness or irritation at the injection site -pain, tingling, numbness in the hands or feet -slow or irregular heartbeat -swelling of the ankle, feet, hands Side effects that usually do not require medical attention (report to your doctor or health care professional if they continue or are bothersome): -bone pain -complete hair loss including hair on your head, underarms, pubic hair, eyebrows, and eyelashes -changes in the color of fingernails -diarrhea -loosening of the fingernails -loss of appetite -muscle or joint pain -red flush to skin -sweating This list may not describe all possible side effects.  Call your doctor for medical advice about side effects. You may report side effects to FDA at 1-800-FDA-1088. Where should I keep my medicine? This drug is given in  a hospital or clinic and will not be stored at home. NOTE: This sheet is a summary. It may not cover all possible information. If you have questions about this medicine, talk to your doctor, pharmacist, or health care provider.  2018 Elsevier/Gold Standard (2014-12-10 19:58:00)  Carboplatin injection What is this medicine? CARBOPLATIN (KAR boe pla tin) is a chemotherapy drug. It targets fast dividing cells, like cancer cells, and causes these cells to die. This medicine is used to treat ovarian cancer and many other cancers. This medicine may be used for other purposes; ask your health care provider or pharmacist if you have questions. COMMON BRAND NAME(S): Paraplatin What should I tell my health care provider before I take this medicine? They need to know if you have any of these conditions: -blood disorders -hearing problems -kidney disease -recent or ongoing radiation therapy -an unusual or allergic reaction to carboplatin, cisplatin, other chemotherapy, other medicines, foods, dyes, or preservatives -pregnant or trying to get pregnant -breast-feeding How should I use this medicine? This drug is usually given as an infusion into a vein. It is administered in a hospital or clinic by a specially trained health care professional. Talk to your pediatrician regarding the use of this medicine in children. Special care may be needed. Overdosage: If you think you have taken too much of this medicine contact a poison control center or emergency room at once. NOTE: This medicine is only for you. Do not share this medicine with others. What if I miss a dose? It is important not to miss a dose. Call your doctor or health care professional if you are unable to keep an appointment. What may interact with this medicine? -medicines for seizures -medicines to increase blood counts like filgrastim, pegfilgrastim, sargramostim -some antibiotics like amikacin, gentamicin, neomycin, streptomycin,  tobramycin -vaccines Talk to your doctor or health care professional before taking any of these medicines: -acetaminophen -aspirin -ibuprofen -ketoprofen -naproxen This list may not describe all possible interactions. Give your health care provider a list of all the medicines, herbs, non-prescription drugs, or dietary supplements you use. Also tell them if you smoke, drink alcohol, or use illegal drugs. Some items may interact with your medicine. What should I watch for while using this medicine? Your condition will be monitored carefully while you are receiving this medicine. You will need important blood work done while you are taking this medicine. This drug may make you feel generally unwell. This is not uncommon, as chemotherapy can affect healthy cells as well as cancer cells. Report any side effects. Continue your course of treatment even though you feel ill unless your doctor tells you to stop. In some cases, you may be given additional medicines to help with side effects. Follow all directions for their use. Call your doctor or health care professional for advice if you get a fever, chills or sore throat, or other symptoms of a cold or flu. Do not treat yourself. This drug decreases your body's ability to fight infections. Try to avoid being around people who are sick. This medicine may increase your risk to bruise or bleed. Call your doctor or health care professional if you notice any unusual bleeding. Be careful brushing and flossing your teeth or using a toothpick because you may get an infection or bleed more easily. If you have any  dental work done, tell your dentist you are receiving this medicine. Avoid taking products that contain aspirin, acetaminophen, ibuprofen, naproxen, or ketoprofen unless instructed by your doctor. These medicines may hide a fever. Do not become pregnant while taking this medicine. Women should inform their doctor if they wish to become pregnant or think  they might be pregnant. There is a potential for serious side effects to an unborn child. Talk to your health care professional or pharmacist for more information. Do not breast-feed an infant while taking this medicine. What side effects may I notice from receiving this medicine? Side effects that you should report to your doctor or health care professional as soon as possible: -allergic reactions like skin rash, itching or hives, swelling of the face, lips, or tongue -signs of infection - fever or chills, cough, sore throat, pain or difficulty passing urine -signs of decreased platelets or bleeding - bruising, pinpoint red spots on the skin, black, tarry stools, nosebleeds -signs of decreased red blood cells - unusually weak or tired, fainting spells, lightheadedness -breathing problems -changes in hearing -changes in vision -chest pain -high blood pressure -low blood counts - This drug may decrease the number of white blood cells, red blood cells and platelets. You may be at increased risk for infections and bleeding. -nausea and vomiting -pain, swelling, redness or irritation at the injection site -pain, tingling, numbness in the hands or feet -problems with balance, talking, walking -trouble passing urine or change in the amount of urine Side effects that usually do not require medical attention (report to your doctor or health care professional if they continue or are bothersome): -hair loss -loss of appetite -metallic taste in the mouth or changes in taste This list may not describe all possible side effects. Call your doctor for medical advice about side effects. You may report side effects to FDA at 1-800-FDA-1088. Where should I keep my medicine? This drug is given in a hospital or clinic and will not be stored at home. NOTE: This sheet is a summary. It may not cover all possible information. If you have questions about this medicine, talk to your doctor, pharmacist, or health care  provider.  2018 Elsevier/Gold Standard (2007-05-16 14:38:05)

## 2017-10-03 ENCOUNTER — Telehealth: Payer: Self-pay | Admitting: *Deleted

## 2017-10-03 NOTE — Telephone Encounter (Signed)
Called patient for chemo follow up. On Sunday and Monday having body aches, muscle aches and pains. Taking arthritis strength tylenol and claritin. Had slight tingling in feet. Denies any nausea or vomiting.Pain in right ear when she is in bed, trying to stay off right side. Will discuss aches when she sees Dr Alvy Bimler prior to next treatment.

## 2017-10-03 NOTE — Telephone Encounter (Signed)
-----   Message from Teodoro Spray, RN sent at 09/30/2017  4:41 PM EDT ----- Regarding: Alvy Bimler - chemo call Dr. Alvy Bimler followup first time taxol/carbo.

## 2017-10-08 ENCOUNTER — Encounter (HOSPITAL_COMMUNITY): Payer: Self-pay

## 2017-10-08 ENCOUNTER — Other Ambulatory Visit: Payer: Self-pay

## 2017-10-08 ENCOUNTER — Emergency Department (HOSPITAL_COMMUNITY): Payer: BLUE CROSS/BLUE SHIELD

## 2017-10-08 ENCOUNTER — Emergency Department (HOSPITAL_COMMUNITY)
Admission: EM | Admit: 2017-10-08 | Discharge: 2017-10-09 | Disposition: A | Discharge status: Home / Self Care | Payer: BLUE CROSS/BLUE SHIELD | Attending: Emergency Medicine | Admitting: Emergency Medicine

## 2017-10-08 DIAGNOSIS — Z87891 Personal history of nicotine dependence: Secondary | ICD-10-CM | POA: Diagnosis not present

## 2017-10-08 DIAGNOSIS — Z5111 Encounter for antineoplastic chemotherapy: Secondary | ICD-10-CM | POA: Diagnosis not present

## 2017-10-08 DIAGNOSIS — R0789 Other chest pain: Secondary | ICD-10-CM | POA: Diagnosis not present

## 2017-10-08 DIAGNOSIS — Z79899 Other long term (current) drug therapy: Secondary | ICD-10-CM | POA: Insufficient documentation

## 2017-10-08 DIAGNOSIS — R079 Chest pain, unspecified: Secondary | ICD-10-CM | POA: Diagnosis not present

## 2017-10-08 DIAGNOSIS — C541 Malignant neoplasm of endometrium: Secondary | ICD-10-CM | POA: Diagnosis not present

## 2017-10-08 NOTE — ED Triage Notes (Signed)
Pt presents to ED from home for chest pain. Pt is going through chemo for endometrial cancer. Pt reports L sided chest pain x3 days. Pt denies any associated symptoms. PT reports new port in R chest x2 weeks.

## 2017-10-09 ENCOUNTER — Emergency Department (HOSPITAL_COMMUNITY): Payer: BLUE CROSS/BLUE SHIELD

## 2017-10-09 DIAGNOSIS — C541 Malignant neoplasm of endometrium: Secondary | ICD-10-CM | POA: Diagnosis not present

## 2017-10-09 DIAGNOSIS — Z5111 Encounter for antineoplastic chemotherapy: Secondary | ICD-10-CM | POA: Diagnosis not present

## 2017-10-09 LAB — CBC WITH DIFFERENTIAL/PLATELET
Basophils Absolute: 0 10*3/uL (ref 0.0–0.1)
Basophils Relative: 0 %
Eosinophils Absolute: 0 10*3/uL (ref 0.0–0.7)
Eosinophils Relative: 1 %
HCT: 31.5 % — ABNORMAL LOW (ref 36.0–46.0)
Hemoglobin: 9.8 g/dL — ABNORMAL LOW (ref 12.0–15.0)
Lymphocytes Relative: 45 %
Lymphs Abs: 1.9 10*3/uL (ref 0.7–4.0)
MCH: 28.2 pg (ref 26.0–34.0)
MCHC: 31.1 g/dL (ref 30.0–36.0)
MCV: 90.5 fL (ref 78.0–100.0)
Monocytes Absolute: 0.6 10*3/uL (ref 0.1–1.0)
Monocytes Relative: 13 %
Neutro Abs: 1.8 10*3/uL (ref 1.7–7.7)
Neutrophils Relative %: 41 %
Platelets: 232 10*3/uL (ref 150–400)
RBC: 3.48 MIL/uL — ABNORMAL LOW (ref 3.87–5.11)
RDW: 14.4 % (ref 11.5–15.5)
WBC: 4.3 10*3/uL (ref 4.0–10.5)

## 2017-10-09 LAB — D-DIMER, QUANTITATIVE: D-Dimer, Quant: 0.99 ug/mL-FEU — ABNORMAL HIGH (ref 0.00–0.50)

## 2017-10-09 LAB — BASIC METABOLIC PANEL
Anion gap: 9 (ref 5–15)
BUN: 14 mg/dL (ref 6–20)
CO2: 26 mmol/L (ref 22–32)
Calcium: 8.8 mg/dL — ABNORMAL LOW (ref 8.9–10.3)
Chloride: 105 mmol/L (ref 98–111)
Creatinine, Ser: 0.67 mg/dL (ref 0.44–1.00)
GFR calc Af Amer: >60 mL/min (ref >60)
GFR calc non Af Amer: >60 mL/min (ref >60)
Glucose, Bld: 109 mg/dL — ABNORMAL HIGH (ref 70–99)
Potassium: 3.7 mmol/L (ref 3.5–5.1)
Sodium: 140 mmol/L (ref 135–145)

## 2017-10-09 LAB — I-STAT TROPONIN, ED: Troponin i, poc: 0.01 ng/mL (ref 0.00–0.08)

## 2017-10-09 MED ORDER — IOPAMIDOL (ISOVUE-370) INJECTION 76%
INTRAVENOUS | Status: AC
  Filled 2017-10-09: qty 100

## 2017-10-09 MED ORDER — IOPAMIDOL (ISOVUE-370) INJECTION 76%
100.0000 mL | Freq: Once | INTRAVENOUS | Status: AC | PRN
  Administered 2017-10-09: 100 mL via INTRAVENOUS

## 2017-10-09 MED ORDER — HEPARIN SOD (PORK) LOCK FLUSH 100 UNIT/ML IV SOLN
500.0000 [IU] | Freq: Once | INTRAVENOUS | Status: AC
  Administered 2017-10-09: 500 [IU]
  Filled 2017-10-09: qty 5

## 2017-10-09 NOTE — ED Provider Notes (Signed)
[facility] DEPT Provider Note   CSN: [id number] Arrival date & time: 10/08/17  2146     History   Chief Complaint Chief Complaint  Patient presents with  . Chest Pain    HPI Dana Hicks is a 42 y.o. female.  Patient presents to the emergency department for evaluation of chest pain.  She has been experiencing intermittent episodes of pain in the center of her chest and just to the left of center.  Pain occurs randomly and intermittently, lasts only for a few minutes and then resolves.  She has not identified anything that causes the pain.  She does not have associated shortness of breath.  No heart palpitations.  She has not had cough or chest congestion.  Patient is currently undergoing chemotherapy for endometrial cancer.  She had hysterectomy 4 weeks ago and had a port placed in her right chest 2 weeks ago.  She is concerned that the pain might be coming from the port.     Past Medical History:  Diagnosis Date  . Allergic rhinitis, seasonal   . Cecal cancer ([place]) 2001   Stage II (T3N0)  04-11-2001cecum-partial colectomy and completed chemo 2002  . Endometrial cancer ([place]) 08/2017   Stage IA, Grade 1  . History of MRSA infection 01/2017   followed by infectious disease center--  recurrent pustular folliculitis  . Nephrolithiasis    bilateral nonobstructive calculi per CT 08-18-2017  . OA (osteoarthritis)   . Ovarian cancer, right ([place]) 08/2017   Stage II Grade 2 Endometrioid  . Rheumatoid arthritis [facility])    rheumatologist-  dr devenswar-- treated w/ oral prednisone daily and methotrexate injection every 3 wks    Patient Active Problem List   Diagnosis Date Noted  . Sinusitis, chronic 09/15/2017  . Lesion of pancreas 09/15/2017  . Endometrial cancer ([place]) 08/31/2017  . Secondary malignant neoplasm of right ovary ([place]) 08/31/2017  . Pustular folliculitis 78/58/8502  . MRSA colonization 05/12/2017  . Left foot pain 01/10/2017  .  Osteoarthritis of both knees 12/01/2016  . Class 3 severe obesity due to excess calories without serious comorbidity with body mass index (BMI) of 40.0 to 44.9 in adult ([place]) 10/01/2016  . High risk medication use 02/06/2016  . Family history of rheumatoid arthritis 02/06/2016  . H/O seasonal allergies 02/06/2016  . Rheumatoid arthritis of multiple sites with negative rheumatoid factor ([place]) 02/03/2016  . History of colon cancer 02/03/2016  . HLA B27 (HLA B27 positive) 02/03/2016    Past Surgical History:  Procedure Laterality Date  . BREAST LUMPECTOMY WITH RADIOACTIVE SEED LOCALIZATION Left 06/28/2014   Benign Procedure: RADIOACTIVE SEED LOCALIZATION LEFT BREAST LUMPECTOMY;  Surgeon: Excell Seltzer, MD;  Location: [facility];  Service: General;  Laterality: Left;  . COLONOSCOPY  06/19/14  . EYE SURGERY Left    plug in tear duct  . IR IMAGING GUIDED PORT INSERTION  09/26/2017  . KNEE ARTHROSCOPY    . PORT A CATH REVISION  2001   in and out  . RIGHT COLECTOMY  06/02/2009   cecum cancer  . ROBOTIC ASSISTED TOTAL HYSTERECTOMY WITH BILATERAL SALPINGO OOPHERECTOMY Bilateral 08/30/2017   Procedure: XI ROBOTIC ASSISTED TOTAL  HYSTERECTOMY BILATERAL  SALPINGO OOPHORECTOMY; LYSIS OF ADHESIONS;  Surgeon: Isabel Caprice, MD;  Location: [facility];  Service: Gynecology;  Laterality: Bilateral;     OB History   None      Home Medications    Prior to Admission medications   Medication Sig  Start Date End Date Taking? Authorizing Provider  acetaminophen (TYLENOL) 650 MG CR tablet Take 1,300 mg by mouth every 8 (eight) hours.   Yes [provider]  dexamethasone (DECADRON) 4 MG tablet Take 5 tabs at the night before and 5 tab the morning of chemotherapy, every 3 weeks, by mouth Patient taking differently: Take 20 mg by mouth See admin instructions. Take 5 tabs at the night before and 5 tab the morning of chemotherapy, every 3 weeks, by mouth 09/27/17  Yes Gorsuch, Ni, MD    diclofenac sodium (VOLTAREN) 1 % GEL APPLY 3 GM TO AFFECTED 3 LARGE JOINTS UP TO 3 TIMES A DAY AS NEEDED AS DIRECTED Patient taking differently: Apply 2 g topically 3 (three) times daily as needed (pain).  05/02/17  Yes Deveshwar, Abel Presto, MD  fluticasone (FLONASE) 50 MCG/ACT nasal spray Place 2 sprays into both nostrils daily.   Yes [provider]  lidocaine-prilocaine (EMLA) cream Apply to affected area once Patient taking differently: Apply 1 application topically daily as needed (port). Apply to affected area once 09/27/17  Yes Alvy Bimler, Ni, MD  loratadine (CLARITIN) 10 MG tablet Take 10 mg by mouth daily after breakfast.   Yes [provider]  omeprazole (PRILOSEC) 20 MG capsule Take 20 mg by mouth 2 (two) times daily before a meal.    Yes [provider]  ondansetron (ZOFRAN) 8 MG tablet Take 1 tablet (8 mg total) by mouth every 8 (eight) hours as needed for refractory nausea / vomiting. Start on day 3 after chemo. 09/27/17  Yes Gorsuch, Ni, MD  predniSONE (DELTASONE) 10 MG tablet TAKE 1 TABLET BY MOUTH EVERY DAY WITH BREAKFAST Patient taking differently: Take 10 mg by mouth daily with breakfast.  09/07/17  Yes Deveshwar, Abel Presto, MD  prochlorperazine (COMPAZINE) 10 MG tablet Take 1 tablet (10 mg total) by mouth every 6 (six) hours as needed (Nausea or vomiting). 09/27/17  Yes Heath Lark, MD  simethicone (MYLICON) 595 MG chewable tablet Chew 125 mg by mouth every 6 (six) hours as needed for flatulence.   Yes [provider]  ibuprofen (ADVIL,MOTRIN) 600 MG tablet Take 1 tablet (600 mg total) by mouth every 6 (six) hours. Patient not taking: Reported on 10/09/2017 08/31/17   Everitt Amber, MD    Family History Family History  Problem Relation Age of Onset  . Arthritis/Rheumatoid Mother   . Uterine cancer Mother   . Allergic rhinitis Father   . Heart disease Father   . Drug abuse Son   . Other Maternal Aunt        Unknown GYN CA  . Colon cancer Maternal Uncle    . Angioedema Neg Hx   . Asthma Neg Hx   . Atopy Neg Hx   . Eczema Neg Hx   . Immunodeficiency Neg Hx   . Urticaria Neg Hx     Social History Social History   Tobacco Use  . Smoking status: Former Smoker    Packs/day: 1.00    Years: 5.00    Pack years: 5.00    Types: Cigarettes    Last attempt to quit: 02/05/1998    Years since quitting: 19.6  . Smokeless tobacco: Never Used  Substance Use Topics  . Alcohol use: Yes    Comment: occ  . Drug use: No     Allergies   Codeine; Penicillins; and Bactrim [sulfamethoxazole-trimethoprim]   Review of Systems Review of Systems  Constitutional: Negative for fever.  Respiratory: Negative for shortness of breath.  Cardiovascular: Positive for chest pain. Negative for leg swelling.  All other systems reviewed and are negative.    Physical Exam Updated Vital Signs BP (!) 142/85 (BP Location: Left Arm)   Pulse 69   Temp 98.3 F (36.8 C) (Oral)   Resp 17   Ht 5' 2" (1.575 m)   Wt 101.6 kg   LMP 08/30/2017   SpO2 99%   BMI 40.97 kg/m   Physical Exam  Constitutional: She is oriented to person, place, and time. She appears well-developed and well-nourished. No distress.  HENT:  Head: Normocephalic and atraumatic.  Right Ear: Hearing normal.  Left Ear: Hearing normal.  Nose: Nose normal.  Mouth/Throat: Oropharynx is clear and moist and mucous membranes are normal.  Eyes: Pupils are equal, round, and reactive to light. Conjunctivae and EOM are normal.  Neck: Normal range of motion. Neck supple.  Cardiovascular: Regular rhythm, S1 normal and S2 normal. Exam reveals no gallop and no friction rub.  No murmur heard. Pulmonary/Chest: Effort normal and breath sounds normal. No respiratory distress. She exhibits no tenderness.  Abdominal: Soft. Normal appearance and bowel sounds are normal. There is no hepatosplenomegaly. There is no tenderness. There is no rebound, no guarding, no tenderness at McBurney's point and negative  Murphy's sign. No hernia.  Musculoskeletal: Normal range of motion.  Neurological: She is alert and oriented to person, place, and time. She has normal strength. No cranial nerve deficit or sensory deficit. Coordination normal. GCS eye subscore is 4. GCS verbal subscore is 5. GCS motor subscore is 6.  Skin: Skin is warm, dry and intact. No rash noted. No cyanosis.  Psychiatric: She has a normal mood and affect. Her speech is normal and behavior is normal. Thought content normal.  Nursing note and vitals reviewed.    ED Treatments / Results  Labs (all labs ordered are listed, but only abnormal results are displayed) Labs Reviewed  D-DIMER, QUANTITATIVE (NOT AT [facility]) - Abnormal; Notable for the following components:      Result Value   D-Dimer, Quant 0.99 (*)    All other components within normal limits  CBC WITH DIFFERENTIAL/PLATELET - Abnormal; Notable for the following components:   RBC 3.48 (*)    Hemoglobin 9.8 (*)    HCT 31.5 (*)    All other components within normal limits  BASIC METABOLIC PANEL - Abnormal; Notable for the following components:   Glucose, Bld 109 (*)    Calcium 8.8 (*)    All other components within normal limits  I-STAT TROPONIN, ED    EKG EKG Interpretation  Date/Time:  Saturday October 08 2017 21:58:29 EDT Ventricular Rate:  91 PR Interval:    QRS Duration: 77 QT Interval:  349 QTC Calculation: 430 R Axis:   24 Text Interpretation:  Sinus rhythm Abnormal R-wave progression, early transition Borderline T abnormalities, anterior leads Confirmed by Orpah Greek [phone]) on 10/08/2017 11:43:20 PM   Radiology Dg Chest 2 View  Result Date: 10/09/2017 CLINICAL DATA:  Initial evaluation for acute chest pain. EXAM: CHEST - 2 VIEW COMPARISON:  Prior radiograph from 02/20/2007. FINDINGS: Right-sided Port-A-Cath in place with tip overlying the cavoatrial junction. Cardiac and mediastinal silhouettes within normal limits. Lungs normally inflated. No  focal infiltrates. No pulmonary edema or pleural effusion. No pneumothorax. No acute osseous abnormality. IMPRESSION: No active cardiopulmonary disease. Electronically Signed   By: Jeannine Boga M.D.   On: 10/09/2017 00:11   Ct Angio Chest Pe W Or Wo Contrast  Result Date:  10/09/2017 CLINICAL DATA:  Chest pain. Chemotherapy for endometrial cancer. Left-sided chest pain for 3 days. EXAM: CT ANGIOGRAPHY CHEST WITH CONTRAST TECHNIQUE: Multidetector CT imaging of the chest was performed using the standard protocol during bolus administration of intravenous contrast. Multiplanar CT image reconstructions and MIPs were obtained to evaluate the vascular anatomy. CONTRAST:  167m ISOVUE-370 IOPAMIDOL (ISOVUE-370) INJECTION 76% COMPARISON:  09/27/2017 FINDINGS: Cardiovascular: Good opacification of the central and segmental pulmonary arteries. No focal filling defects. No evidence of significant pulmonary embolus. Normal heart size. No pericardial effusion. Normal caliber thoracic aorta. No evidence of dissection. Great vessel origins are patent. Mediastinum/Nodes: Esophagus is decompressed. No significant lymphadenopathy in the chest. Thyroid gland is unremarkable. Lungs/Pleura: Mild dependent changes in the lungs. Lungs are otherwise clear and expanded. No pleural effusions. No pneumothorax. Airways are patent. Upper Abdomen: Focal fullness at the junction of the body and tail of the pancreas with surrounding infiltration. This is suspicious for pancreatic neoplasm, measuring about 2.7 cm diameter. Similar changes present on previous study. Tissue sampling was recommended at that study. Possible small lymph nodes in the celiac axis. Musculoskeletal: No destructive bone lesions. Review of the MIP images confirms the above findings. IMPRESSION: 1. No evidence of significant pulmonary embolus. 2. No evidence of active pulmonary disease. 3. Focal mass at the junction of the body and tail of the pancreas with  surrounding infiltration. This is suspicious for pancreatic neoplasm. Tissue sampling was recommended on previous study. Electronically Signed   By: WLucienne CapersM.D.   On: 10/09/2017 03:09    Procedures Procedures (including critical care time)  Medications Ordered in ED Medications  iopamidol (ISOVUE-370) 76 % injection (has no administration in time range)  iopamidol (ISOVUE-370) 76 % injection 100 mL (100 mLs Intravenous Contrast Given 10/09/17 0231)     Initial Impression / Assessment and Plan / ED Course  I have reviewed the triage vital signs and the nursing notes.  Pertinent labs & imaging results that were available during my care of the patient were reviewed by me and considered in my medical decision making (see chart for details).     Patient presents with chest pain.  Patient has minimal cardiac risk factors and symptoms are not consistent with cardiac etiology.  Pain is intermittent and only last for a couple of minutes and then resolves.  She reports that her chest is tender when the pain is there.  This seems to be musculoskeletal in nature, but patient has multiple PE risk factors.  Her d-dimer was elevated and she therefore underwent CT angios of chest.  No evidence of PE is seen.  Patient has a mass in the pancreas that is suspicious for neoplasm.  This is a known entity and she is scheduled for biopsy this week.  Final Clinical Impressions(s) / ED Diagnoses   Final diagnoses:  Atypical chest pain    ED Discharge Orders    None       Imran Nuon, CGwenyth Allegra MD 10/09/17 [phone]

## 2017-10-10 ENCOUNTER — Telehealth: Payer: Self-pay

## 2017-10-10 ENCOUNTER — Other Ambulatory Visit: Payer: Self-pay

## 2017-10-10 ENCOUNTER — Inpatient Hospital Stay: Payer: BLUE CROSS/BLUE SHIELD

## 2017-10-10 ENCOUNTER — Inpatient Hospital Stay (HOSPITAL_BASED_OUTPATIENT_CLINIC_OR_DEPARTMENT_OTHER): Payer: BLUE CROSS/BLUE SHIELD | Admitting: Medical

## 2017-10-10 VITALS — BP 131/89 | HR 99 | Temp 98.9°F | Resp 18 | Ht 62.0 in | Wt 232.5 lb

## 2017-10-10 DIAGNOSIS — Z8049 Family history of malignant neoplasm of other genital organs: Secondary | ICD-10-CM | POA: Diagnosis not present

## 2017-10-10 DIAGNOSIS — M898X9 Other specified disorders of bone, unspecified site: Secondary | ICD-10-CM | POA: Diagnosis not present

## 2017-10-10 DIAGNOSIS — C259 Malignant neoplasm of pancreas, unspecified: Secondary | ICD-10-CM | POA: Diagnosis not present

## 2017-10-10 DIAGNOSIS — N390 Urinary tract infection, site not specified: Secondary | ICD-10-CM | POA: Diagnosis not present

## 2017-10-10 DIAGNOSIS — Z87891 Personal history of nicotine dependence: Secondary | ICD-10-CM | POA: Diagnosis not present

## 2017-10-10 DIAGNOSIS — R319 Hematuria, unspecified: Secondary | ICD-10-CM

## 2017-10-10 DIAGNOSIS — K869 Disease of pancreas, unspecified: Secondary | ICD-10-CM | POA: Diagnosis not present

## 2017-10-10 DIAGNOSIS — Z5111 Encounter for antineoplastic chemotherapy: Secondary | ICD-10-CM | POA: Diagnosis not present

## 2017-10-10 DIAGNOSIS — M069 Rheumatoid arthritis, unspecified: Secondary | ICD-10-CM

## 2017-10-10 DIAGNOSIS — C541 Malignant neoplasm of endometrium: Secondary | ICD-10-CM

## 2017-10-10 DIAGNOSIS — Z800 Family history of malignant neoplasm of digestive organs: Secondary | ICD-10-CM | POA: Diagnosis not present

## 2017-10-10 DIAGNOSIS — Z85038 Personal history of other malignant neoplasm of large intestine: Secondary | ICD-10-CM | POA: Diagnosis not present

## 2017-10-10 DIAGNOSIS — C7961 Secondary malignant neoplasm of right ovary: Secondary | ICD-10-CM

## 2017-10-10 DIAGNOSIS — M0609 Rheumatoid arthritis without rheumatoid factor, multiple sites: Secondary | ICD-10-CM | POA: Diagnosis not present

## 2017-10-10 DIAGNOSIS — G620 Drug-induced polyneuropathy: Secondary | ICD-10-CM | POA: Diagnosis not present

## 2017-10-10 LAB — URINALYSIS, COMPLETE (UACMP) WITH MICROSCOPIC
Bilirubin Urine: NEGATIVE
Glucose, UA: NEGATIVE mg/dL
Ketones, ur: NEGATIVE mg/dL
Nitrite: NEGATIVE
Protein, ur: NEGATIVE mg/dL
Specific Gravity, Urine: 1.003 — ABNORMAL LOW (ref 1.005–1.030)
pH: 6 (ref 5.0–8.0)

## 2017-10-10 MED ORDER — PHENAZOPYRIDINE HCL 200 MG PO TABS: 200.0000 mg | ORAL_TABLET | Freq: Three times a day (TID) | ORAL | 1 refills | Status: DC | PRN

## 2017-10-10 NOTE — Progress Notes (Signed)
Pt seen by PA Van only, no RN assessment performed at this time.  PA aware. 

## 2017-10-10 NOTE — Telephone Encounter (Signed)
Called to see how she is doing after go to ER over the weekend, per Dr. Alvy Bimler. She is still having chest pain that feels like she pulled a muscle. She is taking tylenol for chest pain. Complaining of low grade temperature of 99.7 today. She feels tired. Complaining of urinary discomfort with urination and she thinks she may have a UTI.  Scheduling message sent for appt with Sandi Mealy, PA, with labs prior to appt.

## 2017-10-10 NOTE — Patient Instructions (Signed)

## 2017-10-11 ENCOUNTER — Encounter: Payer: Self-pay | Admitting: Rheumatology

## 2017-10-11 MED ORDER — NITROFURANTOIN MONOHYD MACRO 100 MG PO CAPS
100.0000 mg | ORAL_CAPSULE | Freq: Two times a day (BID) | ORAL | 0 refills | Status: DC
Start: 2017-10-11 — End: 2017-11-02

## 2017-10-11 MED ORDER — PREDNISONE 10 MG PO TABS: ORAL_TABLET | ORAL | 0 refills | Status: DC

## 2017-10-11 NOTE — Telephone Encounter (Signed)
Last Visit: 07/27/17 Next visit: 11/14/17  Okay to refill per Dr. Estanislado Pandy

## 2017-10-11 NOTE — Telephone Encounter (Signed)
Please, call in Prednisone 10 mg po qd rx1

## 2017-10-12 LAB — URINE CULTURE: Culture: 20000 — AB

## 2017-10-12 NOTE — Progress Notes (Signed)
[facility] Progress Note   Dana Hicks [id number] Sep 28, 1975 42 y.o.  Dana Hicks is managed by Dr. Heath Hicks  Actively treated with chemotherapy/immunotherapy: yes  Current Therapy: Carboplatin and paclitaxel  Last Treated: 09/30/2017 (cycle 1, day 1)  Assessment: Plan:    Urinary tract infection with hematuria, site unspecified - Plan: phenazopyridine (PYRIDIUM) 200 MG tablet, nitrofurantoin, macrocrystal-monohydrate, (MACROBID) 100 MG capsule  Endometrial cancer ([place])   Dysuria was suspected UTI with hematuria: A urinalysis was completed today.  A urine culture is pending.  Dana Hicks was given a prescription for Pyridium and Macrobid.  Endometrial cancer: The patient is managed by Dr. Heath Hicks and is status post cycle 1, day 1 of carboplatin and paclitaxel which was dosed on 09/30/2017.  Please see After Visit Summary for patient specific instructions.  Future Appointments  Date Time Provider [place]  10/14/2017 10:45 AM Dana Hicks [facility] None  10/17/2017  1:00 PM Dana Hicks [facility] None  10/21/2017  9:15 AM [facility] LAB 2 [facility] None  10/21/2017  9:30 AM [facility] [place] FLUSH [facility] None  10/21/2017 10:30 AM Dana Hicks [facility] None  10/21/2017 11:00 AM [facility] INFUSION [facility] None  11/14/2017  8:00 AM Dana Hicks [facility] None    No orders of the defined types were placed in this encounter.      Subjective:   Patient ID:  Dana Hicks is a 42 y.o. (DOB 1975-09-18) female.  Chief Complaint:  Chief Complaint  Patient presents with  . Dysuria    HPI Dana Hicks is a 42 year old female with a history of an endometrial adenocarcinoma who is managed by Dr. Alvy Hicks.  She is status post cycle 1, day 1 of carboplatin and paclitaxel which was dosed on 09/30/2017.  She is pending a biopsy of a pancreatic mass.  She has been having subjective fevers.  Her temperature  was 99.7 today.  She has been taking arthritis strength Tylenol for her arthritic pain.  She reports mild dysuria.  She denies any cloudy or foul-smelling urine.  A yea analysis from today returned showing a moderate volume of hemoglobin with trace leukocytes and rare bacteria.  She was seen in the emergency room on 10/08/2017 for chest pains with her work-up returning negative.  It was believed that the patient's chest pain was likely musculoskeletal in nature.  Medications: I have reviewed the patient's current medications.  Allergies:  Allergies  Allergen Reactions  . Codeine Other (See Comments)    headaches  . Penicillins     Severe headaches Has patient had a PCN reaction causing immediate rash, facial/tongue/throat swelling, SOB or lightheadedness with hypotension: No Has patient had a PCN reaction causing severe rash involving mucus membranes or skin necrosis: No Has patient had a PCN reaction that required hospitalization: No Has patient had a PCN reaction occurring within the last 10 years: No If all of the above answers are "NO", then may proceed with Cephalosporin use.   . Bactrim [Sulfamethoxazole-Trimethoprim] Rash    Past Medical History:  Diagnosis Date  . Allergic rhinitis, seasonal   . 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 Endometrial cancer ([place]) 08/2017   Stage IA, Grade 1  . History of MRSA infection 01/2017   followed by infectious disease center--  recurrent pustular folliculitis  . Nephrolithiasis    bilateral nonobstructive calculi per CT 08-18-2017  . OA (  osteoarthritis)   . Ovarian cancer, right ([place]) 08/2017   Stage II Grade 2 Endometrioid  . Rheumatoid arthritis [facility])    rheumatologist-  dr devenswar-- treated w/ oral prednisone daily and methotrexate injection every 3 wks    Past Surgical History:  Procedure Laterality Date  . BREAST LUMPECTOMY WITH RADIOACTIVE SEED LOCALIZATION  Left 06/28/2014   Benign Procedure: RADIOACTIVE SEED LOCALIZATION LEFT BREAST LUMPECTOMY;  Surgeon: Excell Seltzer, Hicks;  Location: [facility];  Service: General;  Laterality: Left;  . COLONOSCOPY  06/19/14  . EYE SURGERY Left    plug in tear duct  . IR IMAGING GUIDED PORT INSERTION  09/26/2017  . KNEE ARTHROSCOPY    . PORT A CATH REVISION  2001   in and out  . RIGHT COLECTOMY  06/02/2009   cecum cancer  . ROBOTIC ASSISTED TOTAL HYSTERECTOMY WITH BILATERAL SALPINGO OOPHERECTOMY Bilateral 08/30/2017   Procedure: XI ROBOTIC ASSISTED TOTAL  HYSTERECTOMY BILATERAL  SALPINGO OOPHORECTOMY; LYSIS OF ADHESIONS;  Surgeon: Dana Hicks;  Location: [facility];  Service: Gynecology;  Laterality: Bilateral;    Family History  Problem Relation Age of Onset  . Arthritis/Rheumatoid Mother   . Uterine cancer Mother   . Allergic rhinitis Father   . Heart disease Father   . Drug abuse Son   . Other Maternal Aunt        Unknown GYN CA  . Colon cancer Maternal Uncle   . Angioedema Neg Hx   . Asthma Neg Hx   . Atopy Neg Hx   . Eczema Neg Hx   . Immunodeficiency Neg Hx   . Urticaria Neg Hx     Social History   Socioeconomic History  . Marital status: Married    Spouse name: Dana Hicks  . Number of children: 2  . Years of education: Not on file  . Highest education level: Not on file  Occupational History  . Occupation: Futures trader  Social Needs  . Financial resource strain: Not on file  . Food insecurity:    Worry: Not on file    Inability: Not on file  . Transportation needs:    Medical: Not on file    Non-medical: Not on file  Tobacco Use  . Smoking status: Former Smoker    Packs/day: 1.00    Years: 5.00    Pack years: 5.00    Types: Cigarettes    Last attempt to quit: 02/05/1998    Years since quitting: 19.6  . Smokeless tobacco: Never Used  Substance and Sexual Activity  . Alcohol use: Yes    Comment: occ  . Drug use: No  . Sexual activity: Not on file    Lifestyle  . Physical activity:    Days per week: Not on file    Minutes per session: Not on file  . Stress: Not on file  Relationships  . Social connections:    Talks on phone: Not on file    Gets together: Not on file    Attends religious service: Not on file    Active member of club or organization: Not on file    Attends meetings of clubs or organizations: Not on file    Relationship status: Not on file  . Intimate partner violence:    Fear of current or ex partner: Not on file    Emotionally abused: Not on file    Physically abused: Not on file    Forced sexual activity: Not on file  Other Topics  Concern  . Not on file  Social History Narrative  . Not on file    Past Medical History, Surgical history, Social history, and Family history were reviewed and updated as appropriate.   Please see review of systems for further details on the patient's review from today.   Review of Systems:  Review of Systems  Constitutional: Positive for fever ( Subjective fever). Negative for chills and diaphoresis.  Respiratory: Negative for cough and shortness of breath.   Cardiovascular: Negative for chest pain, palpitations and leg swelling.  Gastrointestinal: Negative for abdominal pain, constipation, diarrhea, nausea and vomiting.  Genitourinary: Positive for dysuria. Negative for difficulty urinating, flank pain, frequency, hematuria and urgency.  Skin: Negative for rash.  Neurological: Negative for dizziness and headaches.    Objective:   Physical Exam:  BP 131/89 (BP Location: Left Arm, Patient Position: Sitting)   Pulse 99   Temp 98.9 F (37.2 C) (Oral)   Resp 18   Ht '5\' 2"'  (1.575 m)   Wt 232 lb 8 oz (105.5 kg)   LMP 08/30/2017   SpO2 100%   BMI 42.52 kg/m  ECOG: 0  Physical Exam  Constitutional: No distress.  HENT:  Head: Normocephalic and atraumatic.  Cardiovascular: Normal rate, regular rhythm and normal heart sounds. Exam reveals no gallop and no friction rub.   No murmur heard. Pulmonary/Chest: Effort normal and breath sounds normal. No respiratory distress. She has no wheezes. She has no rales.  Abdominal: Soft. Bowel sounds are normal. She exhibits no distension. There is no tenderness. There is no rebound and no guarding.  Musculoskeletal: She exhibits no edema.  Neurological: She is alert. Coordination normal.  Skin: Skin is warm and dry. She is not diaphoretic.  Psychiatric: She has a normal mood and affect. Her behavior is normal. Judgment and thought content normal.    Lab Review:     Component Value Date/Time   NA 140 10/09/2017 0045   K 3.7 10/09/2017 0045   CL 105 10/09/2017 0045   CO2 26 10/09/2017 0045   GLUCOSE 109 (H) 10/09/2017 0045   BUN 14 10/09/2017 0045   CREATININE 0.67 10/09/2017 0045   CREATININE 0.68 07/27/2017 0851   CALCIUM 8.8 (L) 10/09/2017 0045   PROT 7.1 09/27/2017 1054   ALBUMIN 3.6 09/27/2017 1054   AST 10 (L) 09/27/2017 1054   ALT 15 09/27/2017 1054   ALKPHOS 45 09/27/2017 1054   BILITOT 0.3 09/27/2017 1054   GFRNONAA >60 10/09/2017 0045   GFRNONAA 106 07/27/2017 0851   GFRAA >60 10/09/2017 0045   GFRAA 123 07/27/2017 0851       Component Value Date/Time   WBC 4.3 10/09/2017 0045   RBC 3.48 (L) 10/09/2017 0045   HGB 9.8 (L) 10/09/2017 0045   HGB 11.9 08/24/2017 1118   HGB 13.0 05/15/2009 1616   HCT 31.5 (L) 10/09/2017 0045   HCT 38.0 05/15/2009 1616   PLT 232 10/09/2017 0045   PLT 317 08/24/2017 1118   PLT 215 05/15/2009 1616   MCV 90.5 10/09/2017 0045   MCV 92.7 05/15/2009 1616   MCH 28.2 10/09/2017 0045   MCHC 31.1 10/09/2017 0045   RDW 14.4 10/09/2017 0045   RDW 13.6 05/15/2009 1616   LYMPHSABS 1.9 10/09/2017 0045   LYMPHSABS 2.2 05/15/2009 1616   MONOABS 0.6 10/09/2017 0045   MONOABS 0.5 05/15/2009 1616   EOSABS 0.0 10/09/2017 0045   EOSABS 0.1 05/15/2009 1616   BASOSABS 0.0 10/09/2017 0045   BASOSABS 0.0 05/15/2009 1616   -------------------------------  Imaging from last  24 hours (if applicable):  Radiology interpretation: Dg Chest 2 View  Result Date: 10/09/2017 CLINICAL DATA:  Initial evaluation for acute chest pain. EXAM: CHEST - 2 VIEW COMPARISON:  Prior radiograph from 02/20/2007. FINDINGS: Right-sided Port-A-Cath in place with tip overlying the cavoatrial junction. Cardiac and mediastinal silhouettes within normal limits. Lungs normally inflated. No focal infiltrates. No pulmonary edema or pleural effusion. No pneumothorax. No acute osseous abnormality. IMPRESSION: No active cardiopulmonary disease. Electronically Signed   By: Jeannine Boga M.D.   On: 10/09/2017 00:11   Ct Chest W Contrast  Result Date: 09/27/2017 CLINICAL DATA:  Newly diagnosed right ovarian carcinoma.  Staging. EXAM: CT CHEST WITH CONTRAST TECHNIQUE: Multidetector CT imaging of the chest was performed during intravenous contrast administration. CONTRAST:  85m OMNIPAQUE IOHEXOL 300 MG/ML  SOLN COMPARISON:  Abdomen MRI on 09/16/2017, and chest CT on 01/16/2007 FINDINGS: Cardiovascular:  No acute findings. Mediastinum/Nodes: No masses or pathologically enlarged lymph nodes identified. Lungs/Pleura: No pulmonary infiltrate or mass identified. No effusion present. Upper Abdomen: Stable benign hemangioma in the left hepatic lobe measuring approximately 3 cm. Low-attenuation mass in the pancreatic tail measures 4.1 x 3.2 cm, as seen on recent MRI, highly suspicious for pancreatic carcinoma. This has caused splenic vein thrombosis with new venous collaterals in the left upper quadrant. Musculoskeletal:  No suspicious bone lesions. IMPRESSION: No evidence of metastatic disease or other acute findings within the thorax. 4 cm low-attenuation mass in pancreatic tail, highly suspicious for pancreatic carcinoma. This is caused splenic vein thrombosis, with new venous collaterals in the left upper quadrant. Consider endoscopic ultrasound with FNA for tissue diagnosis. Stable benign hepatic hemangioma.  Electronically Signed   By: JEarle GellM.D.   On: 09/27/2017 14:40   Ct Angio Chest Pe W Or Wo Contrast  Result Date: 10/09/2017 CLINICAL DATA:  Chest pain. Chemotherapy for endometrial cancer. Left-sided chest pain for 3 days. EXAM: CT ANGIOGRAPHY CHEST WITH CONTRAST TECHNIQUE: Multidetector CT imaging of the chest was performed using the standard protocol during bolus administration of intravenous contrast. Multiplanar CT image reconstructions and MIPs were obtained to evaluate the vascular anatomy. CONTRAST:  1065mISOVUE-370 IOPAMIDOL (ISOVUE-370) INJECTION 76% COMPARISON:  09/27/2017 FINDINGS: Cardiovascular: Good opacification of the central and segmental pulmonary arteries. No focal filling defects. No evidence of significant pulmonary embolus. Normal heart size. No pericardial effusion. Normal caliber thoracic aorta. No evidence of dissection. Great vessel origins are patent. Mediastinum/Nodes: Esophagus is decompressed. No significant lymphadenopathy in the chest. Thyroid gland is unremarkable. Lungs/Pleura: Mild dependent changes in the lungs. Lungs are otherwise clear and expanded. No pleural effusions. No pneumothorax. Airways are patent. Upper Abdomen: Focal fullness at the junction of the body and tail of the pancreas with surrounding infiltration. This is suspicious for pancreatic neoplasm, measuring about 2.7 cm diameter. Similar changes present on previous study. Tissue sampling was recommended at that study. Possible small lymph nodes in the celiac axis. Musculoskeletal: No destructive bone lesions. Review of the MIP images confirms the above findings. IMPRESSION: 1. No evidence of significant pulmonary embolus. 2. No evidence of active pulmonary disease. 3. Focal mass at the junction of the body and tail of the pancreas with surrounding infiltration. This is suspicious for pancreatic neoplasm. Tissue sampling was recommended on previous study. Electronically Signed   By: WiLucienne Capers.D.    On: 10/09/2017 03:09   Mr Abdomen Wwo Contrast  Result Date: 09/16/2017 CLINICAL DATA:  Pancreatic lesion. EXAM: MRI ABDOMEN WITHOUT AND WITH CONTRAST TECHNIQUE:  Multiplanar multisequence MR imaging of the abdomen was performed both before and after the administration of intravenous contrast. CONTRAST:  34m MULTIHANCE GADOBENATE DIMEGLUMINE 529 MG/ML IV SOLN COMPARISON:  CT 08/18/2017 FINDINGS: Lower chest:  Lung bases are clear Hepatobiliary: Peripherally enhancing lesion in the superior central RIGHT hepatic lobe measuring 2.6 cm is most consistent benign hemangioma. No additional hepatic lesions are present. Normal gallbladder. Normal common bile duct. Gallbladder is noninflamed with a large gallstone measuring 15 mm. Pancreas: There is a complex cystic and solid lesion in the tail the pancreas measuring 3.0 x 1.8 cm compared to 2.9 x 2.1 cm on comparison CT. This lesion is hyperintense on noncontrast T1 weighted imaging and demonstrates hypoenhancement on postcontrast imaging (series 17). Lesions mixed signal intensity on T2 weighted imaging. There is no communication with the pancreatic duct. There is mild inflammation in the peripancreatic fat at this level. No organized fluid collections. The more distal body and head of the pancreas is normal. There is no downstream pancreatic duct dilatation. Spleen: Normal spleen. Adrenals/urinary tract: Adrenal glands and kidneys are normal. Stomach/Bowel: Stomach and limited of the small bowel is unremarkable Vascular/Lymphatic: Abdominal aortic normal caliber. No retroperitoneal periportal lymphadenopathy. Musculoskeletal: No aggressive osseous lesion IMPRESSION: 1. Mixed cystic and solid hypoenhancing lesion in the tail the pancreas is not improved from CT 07/29/2017. Differential includes focal pancreatitis versus pancreatic neoplasm. With no improvement in 1 month time and atypical / irregular imaging features, recommend endoscopic ultrasound and potential  tissue sampling. This lesion may be assessable from the stomach. 2. Solitary gallstone within normal gallbladder. No normal biliary tree. 3. Benign hemangioma in the RIGHT hepatic lobe. These results will be called to the ordering clinician or representative by the Radiologist Assistant, and communication documented in the PACS or zVision Dashboard. Electronically Signed   By: SSuzy BouchardM.D.   On: 09/16/2017 13:06   Ir Imaging Guided Port Insertion  Result Date: 09/26/2017 INDICATION: History of endometrial and ovarian cancer, in need of durable intravenous access for chemotherapy administration. EXAM: IMPLANTED PORT A CATH PLACEMENT WITH ULTRASOUND AND FLUOROSCOPIC GUIDANCE COMPARISON:  None. MEDICATIONS: Clindamycin 600 mg IV; The antibiotic was administered within an appropriate time interval prior to skin puncture. ANESTHESIA/SEDATION: Moderate (conscious) sedation was employed during this procedure. A total of Versed 4 mg and Fentanyl 100 mcg was administered intravenously. Moderate Sedation Time: 27 minutes. The patient's level of consciousness and vital signs were monitored continuously by radiology nursing throughout the procedure under my direct supervision. CONTRAST:  None FLUOROSCOPY TIME:  18 seconds (5 mGy) COMPLICATIONS: None immediate. PROCEDURE: The procedure, risks, benefits, and alternatives were explained to the patient. Questions regarding the procedure were encouraged and answered. The patient understands and consents to the procedure. The right neck and chest were prepped with chlorhexidine in a sterile fashion, and a sterile drape was applied covering the operative field. Maximum barrier sterile technique with sterile gowns and gloves were used for the procedure. A timeout was performed prior to the initiation of the procedure. Local anesthesia was provided with 1% lidocaine with epinephrine. After creating a small venotomy incision, a micropuncture kit was utilized to access the  internal jugular vein. Real-time ultrasound guidance was utilized for vascular access including the acquisition of a permanent ultrasound image documenting patency of the accessed vessel. The microwire was utilized to measure appropriate catheter length. A subcutaneous port pocket was then created along the upper chest wall utilizing a combination of sharp and blunt dissection. The pocket was irrigated with  sterile saline. A single lumen power injectable port was chosen for placement. The 8 Fr catheter was tunneled from the port pocket site to the venotomy incision. The port was placed in the pocket. The external catheter was trimmed to appropriate length. At the venotomy, an 8 Fr peel-away sheath was placed over a guidewire under fluoroscopic guidance. The catheter was then placed through the sheath and the sheath was removed. Final catheter positioning was confirmed and documented with a fluoroscopic spot radiograph. The port was accessed with a Huber needle, aspirated and flushed with heparinized saline. The venotomy site was closed with an interrupted 4-0 Vicryl suture. The port pocket incision was closed with interrupted 2-0 Vicryl suture and the skin was opposed with a running subcuticular 4-0 Vicryl suture. Dermabond and Steri-strips were applied to both incisions. Dressings were placed. The patient tolerated the procedure well without immediate post procedural complication. FINDINGS: After catheter placement, the tip lies within the superior cavoatrial junction. The catheter aspirates and flushes normally and is ready for immediate use. IMPRESSION: Successful placement of a right internal jugular approach power injectable Port-A-Cath. The catheter is ready for immediate use. Electronically Signed   By: Sandi Mariscal M.D.   On: 09/26/2017 17:11

## 2017-10-13 DIAGNOSIS — K869 Disease of pancreas, unspecified: Secondary | ICD-10-CM | POA: Diagnosis not present

## 2017-10-13 DIAGNOSIS — C252 Malignant neoplasm of tail of pancreas: Secondary | ICD-10-CM | POA: Diagnosis not present

## 2017-10-13 NOTE — Progress Notes (Signed)
[place] at [facility]   Progress Note : Established Patient Post-Op  Consult was originally requested by Dr. Freda Munro   No chief complaint on file. Oncologic Summary 1. 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 S/p RA TLH/BSP/Wash and resection of of culdesac implant 08/30/17 2. AND presumed Stage 2 Ovarian (Grade 2 Endometrioid) Adenocarcinoma ? S/p surgery above 08/30/17 ? Undergoing chemotherapy Carbo / Taxol 09/30/17 - __present______ 3. MSH2 is absent on MMR testing 4. Pancreatic lesion 5. Personal h/o cecal CA   HPI: Dana Hicks  is a very nice 42 y.o.  P2  . Interval History Since her last visit she started her chemotherapy with Carbo/Taxol 09/30/2017 (cycle 1, day 1). She has workup in progress for the pancreatic lesion - biopsy was done yesterday. Chest CT was done showing no spread of disease there. Next chemo is 11/21/17. States Dr. Alvy Bimler informed her of a thombosis (noted below) in scan. No anticoag recommendations.  She returns for a vaginal cuff check today.   -->> Has notable family history and denies any genetic testing. Partial colectomy was 2001 and I don't see they did MMR.   . Oncologic Course  Noteworthy is her h/o cecal cancer s/p right hemicolectomy 2001.  She had back pain for a few months which she thought was related to the things she had to lift at work. She went to see orthopedics and exercises were prescribed, which helped. The back pain returned and then was accompanied by "stomach" pains. She had at the same time a MRSA infection of her thighs and was having her medications altered. She attributed the "stomach" pains to the medications. When things continued to bother her after coming off the antibiotics she followed up with GI, given her colon CA history.   GI set her up for scopes (09/01/17) upper and lower, and ordered CTimaging. Last colonoscopy ~3 years ago had  "noncancerous" polyps.  CT abdomen pelvis of 08/18/2017; compared to 12/2006  Hepatobiliary - hemangioma in the right lobe of liver stable. Small amount of perihepatic ascites present. Abnormality of the tail of the pancreas - could represent a mild pancreatitis and early pseudocyst formation although pancreatic neoplasm cannot be excluded. Recommend correlation with lab values. The pancreatic duct distally may be somewhat dilated which is worrisome for malignancy.   Spleen: The spleen is unremarkable.   The colon is unremarkable the anastomosis of colon and small bowel is unremarkable with no complicating features.   The uterus is unremarkable with some low-attenuation centrally possibly related to the patient's menstrual cycle. The left adnexa is unremarkable. However there is a large mass emanating from the right adnexa measuring approximately 10.8 x 8.0 cm with mixed attenuation very worrisome for right ovarian carcinoma. Ascites layers within the pelvis.   I spoke to GI (PA) who have peripherally reviewed the above with their pancreatic specialist and they feel she should proceed with addressing the adnexa before further workup of the pancreas. Some pancreatic labs were drawn and reportedly normal so they are not overly concerned about pancreatitis.   Patient does admit to EtOH 2-3 times daily after work. Negative for CAGE questions and states she can go for days without EtOH.  Given the adnexal mass she was referred to Dr. Ouida Sills who ordered CA125 and CEA and referred her here.   In addition to the above the patient's last 2 menstrual cycles were heavier and longer than normal. No intermenstrual bleeding.  She does have some nausea, no vomiting. Has loss of appetite, some GI changes (diarrhea), bloating and still the back pain. Noting subjective fevers, but when checks temperature she is 99. Had night sweats last night. Is noting some hot flashes.  She underwent Robotic assisted  Hysterectomy/BSO/Washings and resection of a peritoneal (culdesac) implant on 08/30/17. There were no perioperative complications.  Frozen section revealed the uterine cancer, however the ovarian lesion was adenocarcinoma, favor GI primary Final pathology revealed: 1. Uterus and cervix, with left fallopian tube - ENDOMETRIOID ADENOCARCINOMA, FIGO GRADE I, ARISING IN A BACKGROUND OF DIFFUSE COMPLEX ATYPICAL HYPERPLASIA. - CARCINOMA INVADES FOR OF DEPTH OF 0.2 CM WHERE THICKNESS OF MYOMETRIAL WALL IS 2.1 CM. - ALL RESECTION MARGINS ARE NEGATIVE FOR CARCINOMA. - NEGATIVE FOR LYMPHOVASCULAR OR PERINEURAL INVASION. - CERVICAL STROMA IS NOT INVOLVED. - SEE ONCOLOGY TABLE. - SEE NOTE 2. Ovary and fallopian tube, right - PRIMARY OVARIAN ENDOMETRIOID ADENOCARCINOMA, FIGO GRADE II, 12 CM. - THE OVARIAN SURFACE IS FOCALLY INVOLVED BY CARCINOMA. - NEGATIVE FOR LYMPHOVASCULAR INVASION. - BENIGN UNREMARKABLE FALLOPIAN TUBE, NEGATIVE FOR CARCINOMA. - SEE ONCOLOGY TABLE. - SEE NOTE 3. Cul-de-sac biopsy - METASTATIC ADENOCARCINOMA, MOST CONSISTENT WITH PRIMARY OVARIAN ENDOMETRIOID ADENOCARCINOMA. 4. Ovary, left - BENIGN UNREMARKABLE OVARY, NEGATIVE FOR MALIGNANCY. Washings negative Chest imaging pending  Thus she is a presumed Stage 2 Ovarian (Grade 2 Endometrioid) Adenocarcinoma And Stage IA, Grade 1 Endometrioid Uterine Adenocarcinoma   Imported EPIC Oncologic History:   Oncology History   MSI positive  Endometrial :endometrioid Ovarian: Endometrioid      Endometrial cancer ([name])   08/18/2017 Imaging    Ct scan abdomen and pelvis 1. Mixed attenuation mass emanates from the right adnexa measuring 10.8 x 8.0 cm very suspicious for right ovarian carcinoma. 2. Abnormality of the tail of the pancreas may be due to mild pancreatitis and small pseudocyst formation, but neoplasm cannot be excluded. 3. Small amount of ascites within abdomen and pelvis. 4. Small nonobstructing renal calculi  bilaterally.     08/30/2017 Pathology Results    1. Uterus and cervix, with left fallopian tube - ENDOMETRIOID ADENOCARCINOMA, FIGO GRADE I, ARISING IN A BACKGROUND OF DIFFUSE COMPLEX ATYPICAL HYPERPLASIA. - CARCINOMA INVADES FOR OF DEPTH OF 0.2 CM WHERE THICKNESS OF MYOMETRIAL WALL IS 2.1 CM. - ALL RESECTION MARGINS ARE NEGATIVE FOR CARCINOMA. - NEGATIVE FOR LYMPHOVASCULAR OR PERINEURAL INVASION. - CERVICAL STROMA IS NOT INVOLVED. - SEE ONCOLOGY TABLE. - SEE NOTE 2. Ovary and fallopian tube, right - PRIMARY OVARIAN ENDOMETRIOID ADENOCARCINOMA, FIGO GRADE II, 12 CM. - THE OVARIAN SURFACE IS FOCALLY INVOLVED BY CARCINOMA. - NEGATIVE FOR LYMPHOVASCULAR INVASION. - BENIGN UNREMARKABLE FALLOPIAN TUBE, NEGATIVE FOR CARCINOMA. - SEE ONCOLOGY TABLE. - SEE NOTE 3. Cul-de-sac biopsy - METASTATIC ADENOCARCINOMA, MOST CONSISTENT WITH PRIMARY OVARIAN ENDOMETRIOID ADENOCARCINOMA. 4. Ovary, left - BENIGN UNREMARKABLE OVARY, NEGATIVE FOR MALIGNANCY. Microscopic Comment 1. UTERUS, CARCINOMA OR CARCINOSARCOMA Procedure: Total hysterectomy with bilateral salpingo-oophorectomy. Histologic type: Endometrioid adenocarcinoma. Histologic Grade: FIGO Grade I Myometrial invasion: Depth of invasion: 2 mm Myometrial thickness: 21 mm Uterine Serosa Involvement: Not identified Cervical stromal involvement: Not identified Extent of involvement of other organs: Not applicable Lymphovascular invasion: Not identified Regional Lymph Nodes: Examined: 0 Sentinel 0 Non-sentinel 0 Total Tumor block for ancillary studies: 1H MMR / MSI testing: Pending Pathologic Stage Classification (pTNM, AJCC 8th edition): pT1a, pNX (v4.1.0.0) 2. OVARY or FALLOPIAN TUBE or PRIMARY PERITONEUM: Procedure: Salpingo-oophorectomy Specimen Integrity: Intact Tumor Site: Right ovary Ovarian Surface Involvement (required only if applicable): Focally involved by  carcinoma Fallopian Tube Surface Involvement (required only if  applicable): Not identified Tumor Size: 12 cm Histologic Type: Endometrioid adenocarcinoma Histologic Grade: Grade II Implants (required for advanced stage serous/seromucinous borderline tumors only): Not applicable Other Tissue/ Organ Involvement: Cul de sac biopsy involved by tumor Largest Extrapelvic Peritoneal Focus (required only if applicable): Not applicable Peritoneal/Ascitic Fluid: Negative for carcinoma (case # [id number]) Treatment Effect (required only for high-grade serous carcinomas): Not applicable Regional Lymph Nodes: No lymph nodes submitted or found Number of Lymph Nodes Examined: 0 Pathologic Stage Classification (pTNM, AJCC 8th Edition): pT2b, pN0 Representative Tumor Block: 2B and 2E 1. Molecular study for microsatellite instability and immunohistochemical stains for MMR-related proteins are pending and will be reported in an addendum. 2. Immunohistochemical stain show that the ovarian tumor is positive for CK7 and PAX8 (both diffuse), CDX2 (patchy and weak); and negative for CK20. This immunoprofile is consistent with the above diagnosis. Dr. Lyndon Code has reviewed this case and concurs with the above diagnosis. Molecular study for microsatellite instability and immunohistochemical stains for MMR-related proteins are pending and will be reported in an addendum    08/30/2017 Genetic Testing    Patient has genetic testing done for MSI  Results revealed patient has the following mutation(s): loss of [facility] 2    08/30/2017 Surgery    Surgeon: Mart Piggs, MD Pre-operative Diagnosis:  1. Adnexal mass 2. Abnormal uterine bleeding 3. H/o Cecal CA  Post-operative Diagnosis:  1. Adhesive disease post colon resection 2. Endometrial cancer NOS 3. Adenocarcinoma unknown origin, right ovary, suspicious for GI primary  Operation:  1. Lysis of adhesions ~30 minutes 2. Robotic-assisted laparoscopic total hysterectomy with right salpingo-oophorectomy and left  salpingectomy 3. Left oophorectomy (RA-laparoscopic) 4. Pelvic washings  Findings: Adhesions of omentum to anterior abdominal wall. Enlarged cystic right ovary ~10cm. Uterus had small nodules on serosa near where right adnexa was intimate with the surface. No obvious intraoperative rupture of cyst, although in 2 areas the wall was thin and one of these areas had some bleeding. Slight scarring of left bladder dome to LUS/cervix. Uterus on frozen section c/w hyperplasia and a small focus of endometrial CA - no myo invasion, <2cm in size. Frozen section on the right adnexa was carcinoma, met from colon or possibly Gyn, favor GI primary, defer to permanent. Left ovary was WNL.      09/15/2017 Cancer Staging    Staging form: Corpus Uteri - Carcinoma and Carcinosarcoma, AJCC 8th Edition - Pathologic: FIGO Stage IA (pT1a, pN0, cM0) - Signed by Heath Lark, MD on 09/15/2017    09/26/2017 Procedure    Successful placement of a right internal jugular approach power injectable Port-A-Cath. The catheter is ready for immediate use.     Secondary malignant neoplasm of right ovary ([place])   08/23/2017 Tumor Marker    Patient's tumor was tested for the following markers: CA-125 Results of the tumor marker test revealed 139.8    08/31/2017 Initial Diagnosis    Secondary malignant neoplasm of right ovary ([place])    09/15/2017 Cancer Staging    Staging form: Ovary, Fallopian Tube, and Primary Peritoneal Carcinoma, AJCC 8th Edition - Pathologic: Stage IIB (pT2b, pN0, cM0) - Signed by Heath Lark, MD on 09/15/2017    09/27/2017 Imaging    No evidence of metastatic disease or other acute findings within the thorax.  4 cm low-attenuation mass in pancreatic tail, highly suspicious for pancreatic carcinoma. This is caused splenic vein thrombosis, with new venous collaterals in the left upper quadrant.  Consider endoscopic ultrasound with FNA for tissue diagnosis.  Stable benign hepatic hemangioma.     09/27/2017 Tumor  Marker    Patient's tumor was tested for the following markers: CA-125 Results of the tumor marker test revealed 39.4     Measurement of disease:  CA125  08/23/17 = 139.8  CEA  08/23/17 = 1.1  CA19-9 will be drawn today 08/24/17 . 08/24/17 = 14  Radiology: Dg Chest 2 View  Result Date: 10/09/2017 CLINICAL DATA:  Initial evaluation for acute chest pain. EXAM: CHEST - 2 VIEW COMPARISON:  Prior radiograph from 02/20/2007. FINDINGS: Right-sided Port-A-Cath in place with tip overlying the cavoatrial junction. Cardiac and mediastinal silhouettes within normal limits. Lungs normally inflated. No focal infiltrates. No pulmonary edema or pleural effusion. No pneumothorax. No acute osseous abnormality. IMPRESSION: No active cardiopulmonary disease. Electronically Signed   By: Jeannine Boga M.D.   On: 10/09/2017 00:11   Ct Chest W Contrast  Result Date: 09/27/2017 CLINICAL DATA:  Newly diagnosed right ovarian carcinoma.  Staging. EXAM: CT CHEST WITH CONTRAST TECHNIQUE: Multidetector CT imaging of the chest was performed during intravenous contrast administration. CONTRAST:  19m OMNIPAQUE IOHEXOL 300 MG/ML  SOLN COMPARISON:  Abdomen MRI on 09/16/2017, and chest CT on 01/16/2007 FINDINGS: Cardiovascular:  No acute findings. Mediastinum/Nodes: No masses or pathologically enlarged lymph nodes identified. Lungs/Pleura: No pulmonary infiltrate or mass identified. No effusion present. Upper Abdomen: Stable benign hemangioma in the left hepatic lobe measuring approximately 3 cm. Low-attenuation mass in the pancreatic tail measures 4.1 x 3.2 cm, as seen on recent MRI, highly suspicious for pancreatic carcinoma. This has caused splenic vein thrombosis with new venous collaterals in the left upper quadrant. Musculoskeletal:  No suspicious bone lesions. IMPRESSION: No evidence of metastatic disease or other acute findings within the thorax. 4 cm low-attenuation mass in pancreatic tail, highly suspicious for  pancreatic carcinoma. This is caused splenic vein thrombosis, with new venous collaterals in the left upper quadrant. Consider endoscopic ultrasound with FNA for tissue diagnosis. Stable benign hepatic hemangioma. Electronically Signed   By: JEarle GellM.D.   On: 09/27/2017 14:40   Ct Angio Chest Pe W Or Wo Contrast  Result Date: 10/09/2017 CLINICAL DATA:  Chest pain. Chemotherapy for endometrial cancer. Left-sided chest pain for 3 days. EXAM: CT ANGIOGRAPHY CHEST WITH CONTRAST TECHNIQUE: Multidetector CT imaging of the chest was performed using the standard protocol during bolus administration of intravenous contrast. Multiplanar CT image reconstructions and MIPs were obtained to evaluate the vascular anatomy. CONTRAST:  1086mISOVUE-370 IOPAMIDOL (ISOVUE-370) INJECTION 76% COMPARISON:  09/27/2017 FINDINGS: Cardiovascular: Good opacification of the central and segmental pulmonary arteries. No focal filling defects. No evidence of significant pulmonary embolus. Normal heart size. No pericardial effusion. Normal caliber thoracic aorta. No evidence of dissection. Great vessel origins are patent. Mediastinum/Nodes: Esophagus is decompressed. No significant lymphadenopathy in the chest. Thyroid gland is unremarkable. Lungs/Pleura: Mild dependent changes in the lungs. Lungs are otherwise clear and expanded. No pleural effusions. No pneumothorax. Airways are patent. Upper Abdomen: Focal fullness at the junction of the body and tail of the pancreas with surrounding infiltration. This is suspicious for pancreatic neoplasm, measuring about 2.7 cm diameter. Similar changes present on previous study. Tissue sampling was recommended at that study. Possible small lymph nodes in the celiac axis. Musculoskeletal: No destructive bone lesions. Review of the MIP images confirms the above findings. IMPRESSION: 1. No evidence of significant pulmonary embolus. 2. No evidence of active pulmonary disease. 3. Focal mass at the  junction of the body and tail of the pancreas with surrounding infiltration. This is suspicious for pancreatic neoplasm. Tissue sampling was recommended on previous study. Electronically Signed   By: Lucienne Capers M.D.   On: 10/09/2017 03:09   Mr Abdomen Wwo Contrast  Result Date: 09/16/2017 CLINICAL DATA:  Pancreatic lesion. EXAM: MRI ABDOMEN WITHOUT AND WITH CONTRAST TECHNIQUE: Multiplanar multisequence MR imaging of the abdomen was performed both before and after the administration of intravenous contrast. CONTRAST:  39m MULTIHANCE GADOBENATE DIMEGLUMINE 529 MG/ML IV SOLN COMPARISON:  CT 08/18/2017 FINDINGS: Lower chest:  Lung bases are clear Hepatobiliary: Peripherally enhancing lesion in the superior central RIGHT hepatic lobe measuring 2.6 cm is most consistent benign hemangioma. No additional hepatic lesions are present. Normal gallbladder. Normal common bile duct. Gallbladder is noninflamed with a large gallstone measuring 15 mm. Pancreas: There is a complex cystic and solid lesion in the tail the pancreas measuring 3.0 x 1.8 cm compared to 2.9 x 2.1 cm on comparison CT. This lesion is hyperintense on noncontrast T1 weighted imaging and demonstrates hypoenhancement on postcontrast imaging (series 17). Lesions mixed signal intensity on T2 weighted imaging. There is no communication with the pancreatic duct. There is mild inflammation in the peripancreatic fat at this level. No organized fluid collections. The more distal body and head of the pancreas is normal. There is no downstream pancreatic duct dilatation. Spleen: Normal spleen. Adrenals/urinary tract: Adrenal glands and kidneys are normal. Stomach/Bowel: Stomach and limited of the small bowel is unremarkable Vascular/Lymphatic: Abdominal aortic normal caliber. No retroperitoneal periportal lymphadenopathy. Musculoskeletal: No aggressive osseous lesion IMPRESSION: 1. Mixed cystic and solid hypoenhancing lesion in the tail the pancreas is not  improved from CT 07/29/2017. Differential includes focal pancreatitis versus pancreatic neoplasm. With no improvement in 1 month time and atypical / irregular imaging features, recommend endoscopic ultrasound and potential tissue sampling. This lesion may be assessable from the stomach. 2. Solitary gallstone within normal gallbladder. No normal biliary tree. 3. Benign hemangioma in the RIGHT hepatic lobe. These results will be called to the ordering clinician or representative by the Radiologist Assistant, and communication documented in the PACS or zVision Dashboard. Electronically Signed   By: SSuzy BouchardM.D.   On: 09/16/2017 13:06   Ir Imaging Guided Port Insertion  Result Date: 09/26/2017 INDICATION: History of endometrial and ovarian cancer, in need of durable intravenous access for chemotherapy administration. EXAM: IMPLANTED PORT A CATH PLACEMENT WITH ULTRASOUND AND FLUOROSCOPIC GUIDANCE COMPARISON:  None. MEDICATIONS: Clindamycin 600 mg IV; The antibiotic was administered within an appropriate time interval prior to skin puncture. ANESTHESIA/SEDATION: Moderate (conscious) sedation was employed during this procedure. A total of Versed 4 mg and Fentanyl 100 mcg was administered intravenously. Moderate Sedation Time: 27 minutes. The patient's level of consciousness and vital signs were monitored continuously by radiology nursing throughout the procedure under my direct supervision. CONTRAST:  None FLUOROSCOPY TIME:  18 seconds (5 mGy) COMPLICATIONS: None immediate. PROCEDURE: The procedure, risks, benefits, and alternatives were explained to the patient. Questions regarding the procedure were encouraged and answered. The patient understands and consents to the procedure. The right neck and chest were prepped with chlorhexidine in a sterile fashion, and a sterile drape was applied covering the operative field. Maximum barrier sterile technique with sterile gowns and gloves were used for the procedure. A  timeout was performed prior to the initiation of the procedure. Local anesthesia was provided with 1% lidocaine with epinephrine. After creating a small venotomy incision, a micropuncture kit was utilized  to access the internal jugular vein. Real-time ultrasound guidance was utilized for vascular access including the acquisition of a permanent ultrasound image documenting patency of the accessed vessel. The microwire was utilized to measure appropriate catheter length. A subcutaneous port pocket was then created along the upper chest wall utilizing a combination of sharp and blunt dissection. The pocket was irrigated with sterile saline. A single lumen power injectable port was chosen for placement. The 8 Fr catheter was tunneled from the port pocket site to the venotomy incision. The port was placed in the pocket. The external catheter was trimmed to appropriate length. At the venotomy, an 8 Fr peel-away sheath was placed over a guidewire under fluoroscopic guidance. The catheter was then placed through the sheath and the sheath was removed. Final catheter positioning was confirmed and documented with a fluoroscopic spot radiograph. The port was accessed with a Huber needle, aspirated and flushed with heparinized saline. The venotomy site was closed with an interrupted 4-0 Vicryl suture. The port pocket incision was closed with interrupted 2-0 Vicryl suture and the skin was opposed with a running subcuticular 4-0 Vicryl suture. Dermabond and Steri-strips were applied to both incisions. Dressings were placed. The patient tolerated the procedure well without immediate post procedural complication. FINDINGS: After catheter placement, the tip lies within the superior cavoatrial junction. The catheter aspirates and flushes normally and is ready for immediate use. IMPRESSION: Successful placement of a right internal jugular approach power injectable Port-A-Cath. The catheter is ready for immediate use. Electronically  Signed   By: Sandi Mariscal M.D.   On: 09/26/2017 17:11     Current Meds:  Outpatient Encounter Medications as of 10/14/2017  Medication Sig  . acetaminophen (TYLENOL) 650 MG CR tablet Take 1,300 mg by mouth every 8 (eight) hours.  Marland Kitchen dexamethasone (DECADRON) 4 MG tablet Take 5 tabs at the night before and 5 tab the morning of chemotherapy, every 3 weeks, by mouth (Patient taking differently: Take 20 mg by mouth See admin instructions. Take 5 tabs at the night before and 5 tab the morning of chemotherapy, every 3 weeks, by mouth)  . diclofenac sodium (VOLTAREN) 1 % GEL APPLY 3 GM TO AFFECTED 3 LARGE JOINTS UP TO 3 TIMES A DAY AS NEEDED AS DIRECTED (Patient taking differently: Apply 2 g topically 3 (three) times daily as needed (pain). )  . fluticasone (FLONASE) 50 MCG/ACT nasal spray Place 2 sprays into both nostrils daily.  Marland Kitchen ibuprofen (ADVIL,MOTRIN) 600 MG tablet Take 1 tablet (600 mg total) by mouth every 6 (six) hours. (Patient not taking: Reported on 10/09/2017)  . lidocaine-prilocaine (EMLA) cream Apply to affected area once (Patient taking differently: Apply 1 application topically daily as needed (port). Apply to affected area once)  . loratadine (CLARITIN) 10 MG tablet Take 10 mg by mouth daily after breakfast.  . nitrofurantoin, macrocrystal-monohydrate, (MACROBID) 100 MG capsule Take 1 capsule (100 mg total) by mouth 2 (two) times daily.  Marland Kitchen omeprazole (PRILOSEC) 20 MG capsule Take 20 mg by mouth 2 (two) times daily before a meal.   . ondansetron (ZOFRAN) 8 MG tablet Take 1 tablet (8 mg total) by mouth every 8 (eight) hours as needed for refractory nausea / vomiting. Start on day 3 after chemo.  . phenazopyridine (PYRIDIUM) 200 MG tablet Take 1 tablet (200 mg total) by mouth 3 (three) times daily as needed for pain.  . predniSONE (DELTASONE) 10 MG tablet TAKE 1 TABLET BY MOUTH EVERY DAY WITH BREAKFAST  . prochlorperazine (COMPAZINE) 10  MG tablet Take 1 tablet (10 mg total) by mouth every 6  (six) hours as needed (Nausea or vomiting).  . simethicone (MYLICON) 638 MG chewable tablet Chew 125 mg by mouth every 6 (six) hours as needed for flatulence.   No facility-administered encounter medications on file as of 10/14/2017.     Allergy:  Allergies  Allergen Reactions  . Codeine Other (See Comments)    headaches  . Penicillins     Severe headaches Has patient had a PCN reaction causing immediate rash, facial/tongue/throat swelling, SOB or lightheadedness with hypotension: No Has patient had a PCN reaction causing severe rash involving mucus membranes or skin necrosis: No Has patient had a PCN reaction that required hospitalization: No Has patient had a PCN reaction occurring within the last 10 years: No If all of the above answers are "NO", then may proceed with Cephalosporin use.   . Bactrim [Sulfamethoxazole-Trimethoprim] Rash    Past Surgical Hx:  Past Surgical History:  Procedure Laterality Date  . BREAST LUMPECTOMY WITH RADIOACTIVE SEED LOCALIZATION Left 06/28/2014   Benign Procedure: RADIOACTIVE SEED LOCALIZATION LEFT BREAST LUMPECTOMY;  Surgeon: Excell Seltzer, MD;  Location: [facility];  Service: General;  Laterality: Left;  . COLONOSCOPY  06/19/14  . EYE SURGERY Left    plug in tear duct  . IR IMAGING GUIDED PORT INSERTION  09/26/2017  . KNEE ARTHROSCOPY    . PORT A CATH REVISION  2001   in and out  . RIGHT COLECTOMY  06/02/2009   cecum cancer  . ROBOTIC ASSISTED TOTAL HYSTERECTOMY WITH BILATERAL SALPINGO OOPHERECTOMY Bilateral 08/30/2017   Procedure: XI ROBOTIC ASSISTED TOTAL  HYSTERECTOMY BILATERAL  SALPINGO OOPHORECTOMY; LYSIS OF ADHESIONS;  Surgeon: Isabel Caprice, MD;  [facility];  Service: Gynecology;  Laterality: Bilateral;    Past Medical Hx:  Past Medical History:  Diagnosis Date  . Allergic rhinitis, seasonal   . 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  Endometrial cancer ([place]) 08/2017   Stage IA, Grade 1  . History of MRSA infection 01/2017   followed by infectious disease center--  recurrent pustular folliculitis  . Nephrolithiasis    bilateral nonobstructive calculi per CT 08-18-2017  . OA (osteoarthritis)   . Ovarian cancer, right ([place]) 08/2017   Stage II Grade 2 Endometrioid  . Rheumatoid arthritis [facility])    rheumatologist-  dr devenswar-- treated w/ oral prednisone daily and methotrexate injection every 3 wks    Past Gynecological History:   GYNECOLOGIC HISTORY:  Patient's last menstrual period was 08/30/2017. Menarche: 42 years old P 2 LMP 08/04/2017 - see HPI Contraceptive >5 yrs OCP HRT none  Last Pap 03/2017 neg (no HPV done)  Family Hx:  Family History  Problem Relation Age of Onset  . Arthritis/Rheumatoid Mother   . Uterine cancer Mother   . Allergic rhinitis Father   . Heart disease Father   . Drug abuse Son   . Other Maternal Aunt        Unknown GYN CA  . Colon cancer Maternal Uncle   . Angioedema Neg Hx   . Asthma Neg Hx   . Atopy Neg Hx   . Eczema Neg Hx   . Immunodeficiency Neg Hx   . Urticaria Neg Hx     Social Hx:  Tobacco use: Former smoker 19 years. Quit 1997 Alcohol use: 2-3 EtOH per day. Illicit Drug use: none Illicit IV Drug use: none  Review of Systems:  Review of Systems  Constitutional: Positive for fever.  Gastrointestinal: Positive for abdominal pain.  Endocrine: Positive for hot flashes.  Genitourinary: Positive for dysuria.   Musculoskeletal: Positive for arthralgias and back pain.  All other systems reviewed and are negative.   Vitals:  Last menstrual period 08/30/2017. There is no height or weight on file to calculate BMI.   Physical Exam:  General :  Well developed, 42 y.o., female in no apparent distress HEENT:  Normocephalic/atraumatic, symmetric, EOMI, eyelids normal Neck:   No visible masses.  Respiratory:  Respirations unlabored, no use of accessory muscles CV:    Deferred Breast:  Deferred Musculoskeletal: Normal muscle strength. Abdomen:  Wounds CDI.  No visible masses or protrusion Extremities:  No visible edema or deformities Skin:   Normal inspection Neuro/Psych:  No focal motor deficit, no abnormal mental status. Normal gait. Normal affect. Alert and oriented to person, place, and time  Genito Urinary: Vulva: Normal external female genitalia.  Bladder/urethra: Urethral meatus normal in size and location. No lesions or   masses, well supported bladder Speculum exam: Vagina: No lesion, no discharge, no bleeding. Bimanual exam: Cervix/Uterus/Adnexa: Surgically absent  Adnexa: No masses.  Rectovaginal:  Deferred today    Assessment: Stage IA, G1 EM Uterine CA Stage 2 G2 Ovarian CA (endometrioid type) Personal h/o cecal cancer Pancreatic lesion NOS  Plan: 1. Endometrial cancer o She was given a copy of her reports last visit o No further treatment based on her low risk for recurrence (GOG 99 factors including DOI, Grade, and LVSI status) o MMR / MSI testing indicates this may be hereditary in nature and personal history is concerning. Genetics has been arranged. o Surveillance plan i. RTC Q3 months for pelvic  ii. Annual Pap every year +/- since she will not be receiving adjuvant radiation therapy and the vaginal cuff is the site at greatest risk for recurrence. 2. Ovarian cancer o Continue chemotherapy with Dr. Alvy Bimler o Surveillance following that with pelvic and CA125 Q 3 month x 29yr. 3. Chest imaging will be obtained. 4. Pancreatic mass/lesion o Workup in progress 5. Genetics o H/o cecal CA - She doesn't look to have had genetic testing or tumor profiling done, perhaps because the resection was 2011. o I have refered her to genetics; she has appointment there 10/17/17 o They may find a Lynch syndrome or other and make recommendations for closer GI followup.   SIsabel Caprice MD  10/13/2017, 10:41 PM    Cc: MFreda Munro MD (Referring Ob/Gyn) ALondon Pepper MD (PCP) JLaurence Spates MD (GI)

## 2017-10-14 ENCOUNTER — Telehealth: Payer: Self-pay | Admitting: *Deleted

## 2017-10-14 ENCOUNTER — Encounter: Payer: Self-pay | Admitting: Obstetrics

## 2017-10-14 ENCOUNTER — Inpatient Hospital Stay (HOSPITAL_BASED_OUTPATIENT_CLINIC_OR_DEPARTMENT_OTHER): Payer: BLUE CROSS/BLUE SHIELD | Admitting: Obstetrics

## 2017-10-14 VITALS — BP 147/95 | HR 75 | Temp 99.3°F | Resp 18 | Ht 62.0 in | Wt 232.1 lb

## 2017-10-14 DIAGNOSIS — Z90710 Acquired absence of both cervix and uterus: Secondary | ICD-10-CM

## 2017-10-14 DIAGNOSIS — C541 Malignant neoplasm of endometrium: Secondary | ICD-10-CM

## 2017-10-14 DIAGNOSIS — Z90722 Acquired absence of ovaries, bilateral: Secondary | ICD-10-CM

## 2017-10-14 NOTE — Telephone Encounter (Signed)
-----   Message from Heath Lark, MD sent at 10/14/2017  8:43 AM EDT ----- Can you and check how she is doing?

## 2017-10-14 NOTE — Patient Instructions (Signed)
1. RTC 3 months for pelvic 2. Continue care with Dr. Alvy Bimler and your pancreatic doctors

## 2017-10-14 NOTE — Telephone Encounter (Signed)
States she is OK today. Has appt with Dr Gerarda Fraction today. Had biopsy yesterday of pancreas. Is waiting to hear from [facility].  Is "taking antibiotics for UTI- continues to have a fever at times"

## 2017-10-17 ENCOUNTER — Inpatient Hospital Stay (HOSPITAL_BASED_OUTPATIENT_CLINIC_OR_DEPARTMENT_OTHER): Payer: BLUE CROSS/BLUE SHIELD | Admitting: Genetic Counselor

## 2017-10-17 ENCOUNTER — Inpatient Hospital Stay: Payer: BLUE CROSS/BLUE SHIELD

## 2017-10-17 DIAGNOSIS — Z85038 Personal history of other malignant neoplasm of large intestine: Secondary | ICD-10-CM

## 2017-10-17 DIAGNOSIS — C541 Malignant neoplasm of endometrium: Secondary | ICD-10-CM | POA: Diagnosis not present

## 2017-10-17 DIAGNOSIS — C7961 Secondary malignant neoplasm of right ovary: Secondary | ICD-10-CM

## 2017-10-17 DIAGNOSIS — C259 Malignant neoplasm of pancreas, unspecified: Secondary | ICD-10-CM | POA: Diagnosis not present

## 2017-10-17 DIAGNOSIS — Z800 Family history of malignant neoplasm of digestive organs: Secondary | ICD-10-CM | POA: Diagnosis not present

## 2017-10-17 DIAGNOSIS — Z8049 Family history of malignant neoplasm of other genital organs: Secondary | ICD-10-CM | POA: Insufficient documentation

## 2017-10-17 DIAGNOSIS — C252 Malignant neoplasm of tail of pancreas: Secondary | ICD-10-CM | POA: Insufficient documentation

## 2017-10-17 DIAGNOSIS — Z7183 Encounter for nonprocreative genetic counseling: Secondary | ICD-10-CM

## 2017-10-18 ENCOUNTER — Encounter: Payer: Self-pay | Admitting: Genetic Counselor

## 2017-10-18 ENCOUNTER — Telehealth: Payer: Self-pay

## 2017-10-18 DIAGNOSIS — Z800 Family history of malignant neoplasm of digestive organs: Secondary | ICD-10-CM | POA: Insufficient documentation

## 2017-10-18 NOTE — Telephone Encounter (Signed)
Called and given below message. She verbalized understanding. 

## 2017-10-18 NOTE — Progress Notes (Signed)
REFERRING PROVIDER: Heath Lark, MD [place], [place] [id number]  PRIMARY PROVIDER:  [name], MD  PRIMARY REASON FOR VISIT:  1. Endometrial cancer ([place])   2. History of colon cancer   3. Secondary malignant neoplasm of right ovary ([place])   4. Malignant neoplasm of pancreas, unspecified location of malignancy ([place])   5. Family history of uterine cancer   6. Family history of colon cancer      HISTORY OF PRESENT ILLNESS:   Dana Hicks, a 42 y.o. female, was seen for a [facility] cancer genetics consultation at the request of Dr. Alvy Bimler due to a personal and family history of cancer.  Dana Hicks presents to clinic today to discuss the possibility of a hereditary predisposition to cancer, genetic testing, and to further clarify her future cancer risks, as well as potential cancer risks for family members.   In 2001, at the age of 46, Dana Hicks was diagnosed with colon cancer.  In 2019, at the age of 70, Dana Hicks was diagnosed with uterine, ovarian and pancreatic cancer. MSI and IHC testing was performed on the uterine tumor which found MSI-H, and loss of MSH2.  This is suggestive of Lynch syndrome, due to an MSH2 mutation.     CANCER HISTORY:  Oncology History   MSI positive  Endometrial :endometrioid Ovarian: Endometrioid      Endometrial cancer ([place])   08/18/2017 Imaging    Ct scan abdomen and pelvis 1. Mixed attenuation mass emanates from the right adnexa measuring 10.8 x 8.0 cm very suspicious for right ovarian carcinoma. 2. Abnormality of the tail of the pancreas may be due to mild pancreatitis and small pseudocyst formation, but neoplasm cannot be excluded. 3. Small amount of ascites within abdomen and pelvis. 4. Small nonobstructing renal calculi bilaterally.     08/30/2017 Pathology Results    1. Uterus and cervix, with left fallopian tube - ENDOMETRIOID ADENOCARCINOMA, FIGO GRADE I, ARISING IN A BACKGROUND OF DIFFUSE COMPLEX ATYPICAL  HYPERPLASIA. - CARCINOMA INVADES FOR OF DEPTH OF 0.2 CM WHERE THICKNESS OF MYOMETRIAL WALL IS 2.1 CM. - ALL RESECTION MARGINS ARE NEGATIVE FOR CARCINOMA. - NEGATIVE FOR LYMPHOVASCULAR OR PERINEURAL INVASION. - CERVICAL STROMA IS NOT INVOLVED. - SEE ONCOLOGY TABLE. - SEE NOTE 2. Ovary and fallopian tube, right - PRIMARY OVARIAN ENDOMETRIOID ADENOCARCINOMA, FIGO GRADE II, 12 CM. - THE OVARIAN SURFACE IS FOCALLY INVOLVED BY CARCINOMA. - NEGATIVE FOR LYMPHOVASCULAR INVASION. - BENIGN UNREMARKABLE FALLOPIAN TUBE, NEGATIVE FOR CARCINOMA. - SEE ONCOLOGY TABLE. - SEE NOTE 3. Cul-de-sac biopsy - METASTATIC ADENOCARCINOMA, MOST CONSISTENT WITH PRIMARY OVARIAN ENDOMETRIOID ADENOCARCINOMA. 4. Ovary, left - BENIGN UNREMARKABLE OVARY, NEGATIVE FOR MALIGNANCY. Microscopic Comment 1. UTERUS, CARCINOMA OR CARCINOSARCOMA Procedure: Total hysterectomy with bilateral salpingo-oophorectomy. Histologic type: Endometrioid adenocarcinoma. Histologic Grade: FIGO Grade I Myometrial invasion: Depth of invasion: 2 mm Myometrial thickness: 21 mm Uterine Serosa Involvement: Not identified Cervical stromal involvement: Not identified Extent of involvement of other organs: Not applicable Lymphovascular invasion: Not identified Regional Lymph Nodes: Examined: 0 Sentinel 0 Non-sentinel 0 Total Tumor block for ancillary studies: 1H MMR / MSI testing: Pending Pathologic Stage Classification (pTNM, AJCC 8th edition): pT1a, pNX (v4.1.0.0) 2. OVARY or FALLOPIAN TUBE or PRIMARY PERITONEUM: Procedure: Salpingo-oophorectomy Specimen Integrity: Intact Tumor Site: Right ovary Ovarian Surface Involvement (required only if applicable): Focally involved by carcinoma Fallopian Tube Surface Involvement (required only if applicable): Not identified Tumor Size: 12 cm Histologic Type: Endometrioid adenocarcinoma Histologic Grade: Grade II Implants (required for advanced stage serous/seromucinous borderline  tumors only):  Not applicable Other Tissue/ Organ Involvement: Cul de sac biopsy involved by tumor Largest Extrapelvic Peritoneal Focus (required only if applicable): Not applicable Peritoneal/Ascitic Fluid: Negative for carcinoma (case # [id number]) Treatment Effect (required only for high-grade serous carcinomas): Not applicable Regional Lymph Nodes: No lymph nodes submitted or found Number of Lymph Nodes Examined: 0 Pathologic Stage Classification (pTNM, AJCC 8th Edition): pT2b, pN0 Representative Tumor Block: 2B and 2E 1. Molecular study for microsatellite instability and immunohistochemical stains for MMR-related proteins are pending and will be reported in an addendum. 2. Immunohistochemical stain show that the ovarian tumor is positive for CK7 and PAX8 (both diffuse), CDX2 (patchy and weak); and negative for CK20. This immunoprofile is consistent with the above diagnosis. Dr. Lyndon Code has reviewed this case and concurs with the above diagnosis. Molecular study for microsatellite instability and immunohistochemical stains for MMR-related proteins are pending and will be reported in an addendum    08/30/2017 Genetic Testing    Patient has genetic testing done for MSI  Results revealed patient has the following mutation(s): loss of [facility] 2    08/30/2017 Surgery    Surgeon: Mart Piggs, MD Pre-operative Diagnosis:  1. Adnexal mass 2. Abnormal uterine bleeding 3. H/o Cecal CA  Post-operative Diagnosis:  1. Adhesive disease post colon resection 2. Endometrial cancer NOS 3. Adenocarcinoma unknown origin, right ovary, suspicious for GI primary  Operation:  1. Lysis of adhesions ~30 minutes 2. Robotic-assisted laparoscopic total hysterectomy with right salpingo-oophorectomy and left salpingectomy 3. Left oophorectomy (RA-laparoscopic) 4. Pelvic washings  Findings: Adhesions of omentum to anterior abdominal wall. Enlarged cystic right ovary ~10cm. Uterus had small nodules on serosa near where  right adnexa was intimate with the surface. No obvious intraoperative rupture of cyst, although in 2 areas the wall was thin and one of these areas had some bleeding. Slight scarring of left bladder dome to LUS/cervix. Uterus on frozen section c/w hyperplasia and a small focus of endometrial CA - no myo invasion, <2cm in size. Frozen section on the right adnexa was carcinoma, met from colon or possibly Gyn, favor GI primary, defer to permanent. Left ovary was WNL.      09/15/2017 Cancer Staging    Staging form: Corpus Uteri - Carcinoma and Carcinosarcoma, AJCC 8th Edition - Pathologic: FIGO Stage IA (pT1a, pN0, cM0) - Signed by Heath Lark, MD on 09/15/2017    09/26/2017 Procedure    Successful placement of a right internal jugular approach power injectable Port-A-Cath. The catheter is ready for immediate use.     Secondary malignant neoplasm of right ovary ([place])   08/23/2017 Tumor Marker    Patient's tumor was tested for the following markers: CA-125 Results of the tumor marker test revealed 139.8    08/31/2017 Initial Diagnosis    Secondary malignant neoplasm of right ovary ([place])    09/15/2017 Cancer Staging    Staging form: Ovary, Fallopian Tube, and Primary Peritoneal Carcinoma, AJCC 8th Edition - Pathologic: Stage IIB (pT2b, pN0, cM0) - Signed by Heath Lark, MD on 09/15/2017    09/27/2017 Imaging    No evidence of metastatic disease or other acute findings within the thorax.  4 cm low-attenuation mass in pancreatic tail, highly suspicious for pancreatic carcinoma. This is caused splenic vein thrombosis, with new venous collaterals in the left upper quadrant. Consider endoscopic ultrasound with FNA for tissue diagnosis.  Stable benign hepatic hemangioma.     09/27/2017 Tumor Marker    Patient's tumor was tested for the following  markers: CA-125 Results of the tumor marker test revealed 39.4      HORMONAL RISK FACTORS:  Menarche was at age 33.  First live birth at age 50.  OCP use  for approximately 3 years.  Ovaries intact: no.  Hysterectomy: yes.  Menopausal status: postmenopausal.  HRT use: 0 years. Colonoscopy: yes; colon cancer at 62. Mammogram within the last year: yes. Number of breast biopsies: 1. Up to date with pelvic exams:  yes. Any excessive radiation exposure in the past:  Has had multiple CT scans  Past Medical History:  Diagnosis Date  . Allergic rhinitis, seasonal   . Cecal cancer ([facility]) 2001   Stage II (T3N0)  04-11-2001cecum-partial colectomy and completed chemo 2002  . Endometrial cancer ([place]) 08/2017   Stage IA, Grade 1  . Family history of colon cancer   . Family history of uterine cancer   . History of MRSA infection 01/2017   followed by infectious disease center--  recurrent pustular folliculitis  . Nephrolithiasis    bilateral nonobstructive calculi per CT 08-18-2017  . OA (osteoarthritis)   . Ovarian cancer, right ([place]) 08/2017   Stage II Grade 2 Endometrioid  . Rheumatoid arthritis [facility])    rheumatologist-  dr devenswar-- treated w/ oral prednisone daily and methotrexate injection every 3 wks    Past Surgical History:  Procedure Laterality Date  . BREAST LUMPECTOMY WITH RADIOACTIVE SEED LOCALIZATION Left 06/28/2014   Benign Procedure: RADIOACTIVE SEED LOCALIZATION LEFT BREAST LUMPECTOMY;  Surgeon: Excell Seltzer, MD;  Location: [facility];  Service: General;  Laterality: Left;  . COLONOSCOPY  06/19/14  . EYE SURGERY Left    plug in tear duct  . IR IMAGING GUIDED PORT INSERTION  09/26/2017  . KNEE ARTHROSCOPY    . PORT A CATH REVISION  2001   in and out  . RIGHT COLECTOMY  06/02/2009   cecum cancer  . ROBOTIC ASSISTED TOTAL HYSTERECTOMY WITH BILATERAL SALPINGO OOPHERECTOMY Bilateral 08/30/2017   Procedure: XI ROBOTIC ASSISTED TOTAL  HYSTERECTOMY BILATERAL  SALPINGO OOPHORECTOMY; LYSIS OF ADHESIONS;  Surgeon: Isabel Caprice, MD;  [facility];  Service: Gynecology;  Laterality: Bilateral;    Social  History   Socioeconomic History  . Marital status: Married    Spouse name: Quita Skye  . Number of children: 2  . Years of education: Not on file  . Highest education level: Not on file  Occupational History  . Occupation: Futures trader  Social Needs  . Financial resource strain: Not on file  . Food insecurity:    Worry: Not on file    Inability: Not on file  . Transportation needs:    Medical: Not on file    Non-medical: Not on file  Tobacco Use  . Smoking status: Former Smoker    Packs/day: 1.00    Years: 5.00    Pack years: 5.00    Types: Cigarettes    Last attempt to quit: 02/05/1998    Years since quitting: 19.7  . Smokeless tobacco: Never Used  Substance and Sexual Activity  . Alcohol use: Yes    Comment: occ  . Drug use: No  . Sexual activity: Not Currently  Lifestyle  . Physical activity:    Days per week: Not on file    Minutes per session: Not on file  . Stress: Not on file  Relationships  . Social connections:    Talks on phone: Not on file    Gets together: Not on file  Attends religious service: Not on file    Active member of club or organization: Not on file    Attends meetings of clubs or organizations: Not on file    Relationship status: Not on file  Other Topics Concern  . Not on file  Social History Narrative  . Not on file     FAMILY HISTORY:  We obtained a detailed, 4-generation family history.  Significant diagnoses are listed below: Family History  Problem Relation Age of Onset  . Arthritis/Rheumatoid Mother   . Uterine cancer Mother 86  . Allergic rhinitis Father   . Heart disease Father   . Drug abuse Son   . Other Maternal Aunt        Unknown GYN CA  . Colon cancer Maternal Uncle 14  . Diabetes Maternal Grandmother   . Cancer Maternal Grandfather        d. 43s of liver/lung cancer  . Angioedema Neg Hx   . Asthma Neg Hx   . Atopy Neg Hx   . Eczema Neg Hx   . Immunodeficiency Neg Hx   . Urticaria Neg Hx     The patient  has a son and daughter who are cancer free.  She is an only child.  Her mother was diagnosed with Stage IV uterine cancer at 17 and died at 40.  Her father is living at 66.  The patient's mother had a brother and sister.  The brother had CP and died of colon cancer at 61.  The sister had an unknown GYN cancer and died at 14.  The maternal grandparents are deceased.  The grandmother died of diabetes and old age and the grandfather died of cancer in his 56's.  The patient's father has four sisters.  All are living and cancer free.  His parents are deceased from non-cancer related issues.  Dana Hicks is unaware of previous family history of genetic testing for hereditary cancer risks. Patient's maternal ancestors are of Caucasian NOS descent, and paternal ancestors are of Caucasian NOS and [place] descent. There is no reported Ashkenazi Jewish ancestry. There is no known consanguinity.  GENETIC COUNSELING ASSESSMENT: Dana Hicks is a 42 y.o. female with a personal history of four cancers at a young age with tumor testing indicating a loss of MSH2 IHC and a family history of uterine and colon cancer which is somewhat suggestive of a Lynch syndrome and predisposition to cancer. We, therefore, discussed and recommended the following at today's visit.   DISCUSSION: We discussed that about 5-6% of colon caner is due to hereditary causes, most commonly Lynch syndrome.  The patient has four cancers that are also common Lynch syndrome cancers.  While her young colon cancer and the pancreatic cancer could be the result of APC mutations, the tumor testing on the uterine tumor suggests that her cancer and that in her family is due to an MSH2 mutation.  We discussed that germline and tumor testing can help confirm this diagnosis.  Genetic testing of the blood will determine whether she has a hereditary mutation within the MSH2 gene.  Additionally, testing of the tumor can confirm whether her uterine tumor was  also associated with Lynch syndrome.  We reviewed the characteristics, features and inheritance patterns of hereditary cancer syndromes. We also discussed genetic testing, including the appropriate family members to test, the process of testing, insurance coverage and turn-around-time for results. We discussed the implications of a negative, positive and/or variant of uncertain significant result. We  recommended Dana Hicks pursue genetic testing for the CancerNext gene panel. The CancerNext gene panel offered by [employer] includes sequencing and rearrangement analysis for the following 34 genes:   APC, ATM, BARD1, BMPR1A, BRCA1, BRCA2, BRIP1, CDH1, CDK4, CDKN2A, CHEK2, DICER1, HOXB13, EPCAM, GREM1, MLH1, MRE11A, MSH2, MSH6, MUTYH, NBN, NF1, PALB2, PMS2, POLD1, POLE, PTEN, RAD50, RAD51C, RAD51D, SMAD4, SMARCA4, STK11, and TP53.    Paired tumor testing will be performed to determine if the uterine tumor has double MSH2 mutations that will confirm her diagnosis of Lynch syndrome.  Since tumor testing already identified MSI-H status, some treatment options may be available due to that status.  We discussed talking with her oncologist about this and whether treating her based on her MSI-H status is appropriate for her cancer.     Based on Dana Hicks personal and family history of cancer, she meets medical criteria for genetic testing. Despite that she meets criteria, she may still have an out of pocket cost. We discussed that if her out of pocket cost for testing is over $100, the laboratory will call and confirm whether she wants to proceed with testing.  If the out of pocket cost of testing is less than $100 she will be billed by the genetic testing laboratory.   In order to estimate her chance of having a Lynch syndrome mutation, we used statistical models (PREMM1.2.6) and laboratory data that take into account her personal medical history, family history and ancestry.  Because each model is different,  there can be a lot of variability in the risks they give.  Therefore, these numbers must be considered a rough range and not a precise risk of having a Lynch syndrome mutation.  These models estimate that she has approximately a >/= 50% chance of having a mutation. Based on this assessment of her family and personal history, genetic testing is recommended.   PLAN: After considering the risks, benefits, and limitations, Dana Hicks  provided informed consent to pursue genetic testing and the blood sample was sent to [facility] for analysis of the TumorNext-[place] with CancerNext. Results should be available within approximately 6 weeks' time, at which point they will be disclosed by telephone to Dana Hicks, as will any additional recommendations warranted by these results. Dana Hicks will receive a summary of her genetic counseling visit and a copy of her results once available. This information will also be available in Epic. We encouraged Dana Hicks to remain in contact with cancer genetics annually so that we can continuously update the family history and inform her of any changes in cancer genetics and testing that may be of benefit for her family. Dana Hicks questions were answered to her satisfaction today. Our contact information was provided should additional questions or concerns arise.  Lastly, we encouraged Dana Hicks to remain in contact with cancer genetics annually so that we can continuously update the family history and inform her of any changes in cancer genetics and testing that may be of benefit for this family.   Ms.  Hicks questions were answered to her satisfaction today. Our contact information was provided should additional questions or concerns arise. Thank you for the referral and allowing [place] to share in the care of your patient.   Orena Cavazos P. Florene Glen, [place], [facility] Certified Genetic Counselor [email or web address] phone: [phone]  The patient was seen for a total of 45 minutes in  face-to-face genetic counseling.  This patient was discussed with Drs. Magrinat, Lindi Adie and/or Burr Medico who agrees with the  above.    _______________________________________________________________________ For Office Staff:  Number of people involved in session: 1 Was an Intern/ student involved with case: no

## 2017-10-18 NOTE — Telephone Encounter (Signed)
She called and left a message. She has chronically dry sinus passages. Using Saline nasal spray TID. She is having nasal stuffiness. Can she use Sudafed for the nasal stuffiness?

## 2017-10-18 NOTE — Telephone Encounter (Signed)
OK to use Sudafed for short term

## 2017-10-19 DIAGNOSIS — Z85038 Personal history of other malignant neoplasm of large intestine: Secondary | ICD-10-CM | POA: Diagnosis not present

## 2017-10-19 DIAGNOSIS — Z800 Family history of malignant neoplasm of digestive organs: Secondary | ICD-10-CM | POA: Diagnosis not present

## 2017-10-19 DIAGNOSIS — C7961 Secondary malignant neoplasm of right ovary: Secondary | ICD-10-CM | POA: Diagnosis not present

## 2017-10-19 DIAGNOSIS — C259 Malignant neoplasm of pancreas, unspecified: Secondary | ICD-10-CM | POA: Diagnosis not present

## 2017-10-20 DIAGNOSIS — C541 Malignant neoplasm of endometrium: Secondary | ICD-10-CM | POA: Diagnosis not present

## 2017-10-20 DIAGNOSIS — C252 Malignant neoplasm of tail of pancreas: Secondary | ICD-10-CM | POA: Diagnosis not present

## 2017-10-20 DIAGNOSIS — C182 Malignant neoplasm of ascending colon: Secondary | ICD-10-CM | POA: Diagnosis not present

## 2017-10-21 ENCOUNTER — Inpatient Hospital Stay (HOSPITAL_BASED_OUTPATIENT_CLINIC_OR_DEPARTMENT_OTHER): Payer: BLUE CROSS/BLUE SHIELD | Admitting: Hematology and Oncology

## 2017-10-21 ENCOUNTER — Inpatient Hospital Stay: Payer: BLUE CROSS/BLUE SHIELD

## 2017-10-21 ENCOUNTER — Telehealth: Payer: Self-pay | Admitting: Hematology and Oncology

## 2017-10-21 VITALS — BP 151/92 | HR 87 | Temp 98.4°F | Resp 13 | Ht 62.0 in | Wt 229.1 lb

## 2017-10-21 DIAGNOSIS — C7961 Secondary malignant neoplasm of right ovary: Secondary | ICD-10-CM

## 2017-10-21 DIAGNOSIS — Z800 Family history of malignant neoplasm of digestive organs: Secondary | ICD-10-CM | POA: Diagnosis not present

## 2017-10-21 DIAGNOSIS — C541 Malignant neoplasm of endometrium: Secondary | ICD-10-CM

## 2017-10-21 DIAGNOSIS — C259 Malignant neoplasm of pancreas, unspecified: Secondary | ICD-10-CM | POA: Diagnosis not present

## 2017-10-21 DIAGNOSIS — N390 Urinary tract infection, site not specified: Secondary | ICD-10-CM | POA: Diagnosis not present

## 2017-10-21 DIAGNOSIS — Z5111 Encounter for antineoplastic chemotherapy: Secondary | ICD-10-CM | POA: Diagnosis not present

## 2017-10-21 DIAGNOSIS — M898X9 Other specified disorders of bone, unspecified site: Secondary | ICD-10-CM

## 2017-10-21 DIAGNOSIS — M0609 Rheumatoid arthritis without rheumatoid factor, multiple sites: Secondary | ICD-10-CM | POA: Diagnosis not present

## 2017-10-21 DIAGNOSIS — G620 Drug-induced polyneuropathy: Secondary | ICD-10-CM

## 2017-10-21 DIAGNOSIS — D490 Neoplasm of unspecified behavior of digestive system: Secondary | ICD-10-CM

## 2017-10-21 DIAGNOSIS — Z8049 Family history of malignant neoplasm of other genital organs: Secondary | ICD-10-CM | POA: Diagnosis not present

## 2017-10-21 DIAGNOSIS — Z87891 Personal history of nicotine dependence: Secondary | ICD-10-CM | POA: Diagnosis not present

## 2017-10-21 DIAGNOSIS — R319 Hematuria, unspecified: Secondary | ICD-10-CM | POA: Diagnosis not present

## 2017-10-21 DIAGNOSIS — K869 Disease of pancreas, unspecified: Secondary | ICD-10-CM | POA: Diagnosis not present

## 2017-10-21 DIAGNOSIS — Z85038 Personal history of other malignant neoplasm of large intestine: Secondary | ICD-10-CM | POA: Diagnosis not present

## 2017-10-21 DIAGNOSIS — T451X5A Adverse effect of antineoplastic and immunosuppressive drugs, initial encounter: Secondary | ICD-10-CM

## 2017-10-21 LAB — CBC WITH DIFFERENTIAL (CANCER CENTER ONLY)
Basophils Absolute: 0 10*3/uL (ref 0.0–0.1)
Basophils Relative: 1 %
Eosinophils Absolute: 0 10*3/uL (ref 0.0–0.5)
Eosinophils Relative: 0 %
HCT: 33.7 % — ABNORMAL LOW (ref 34.8–46.6)
Hemoglobin: 10.8 g/dL — ABNORMAL LOW (ref 11.6–15.9)
Lymphocytes Relative: 8 %
Lymphs Abs: 0.8 10*3/uL — ABNORMAL LOW (ref 0.9–3.3)
MCH: 27.9 pg (ref 25.1–34.0)
MCHC: 32 g/dL (ref 31.5–36.0)
MCV: 87.4 fL (ref 79.5–101.0)
Monocytes Absolute: 0.1 10*3/uL (ref 0.1–0.9)
Monocytes Relative: 1 %
Neutro Abs: 9.3 10*3/uL — ABNORMAL HIGH (ref 1.5–6.5)
Neutrophils Relative %: 90 %
Platelet Count: 288 10*3/uL (ref 145–400)
RBC: 3.85 MIL/uL (ref 3.70–5.45)
RDW: 15.7 % — ABNORMAL HIGH (ref 11.2–14.5)
WBC Count: 10.2 10*3/uL (ref 3.9–10.3)

## 2017-10-21 LAB — CMP (CANCER CENTER ONLY)
ALT: 15 U/L (ref 0–44)
AST: 10 U/L — ABNORMAL LOW (ref 15–41)
Albumin: 3.5 g/dL (ref 3.5–5.0)
Alkaline Phosphatase: 51 U/L (ref 38–126)
Anion gap: 14 (ref 5–15)
BUN: 13 mg/dL (ref 6–20)
CO2: 23 mmol/L (ref 22–32)
Calcium: 9.6 mg/dL (ref 8.9–10.3)
Chloride: 106 mmol/L (ref 98–111)
Creatinine: 0.72 mg/dL (ref 0.44–1.00)
GFR, Est AFR Am: >60 mL/min (ref >60)
GFR, Estimated: >60 mL/min (ref >60)
Glucose, Bld: 214 mg/dL — ABNORMAL HIGH (ref 70–99)
Potassium: 3.8 mmol/L (ref 3.5–5.1)
Sodium: 143 mmol/L (ref 135–145)
Total Bilirubin: <0.2 mg/dL — ABNORMAL LOW (ref 0.3–1.2)
Total Protein: 7.4 g/dL (ref 6.5–8.1)

## 2017-10-21 MED ORDER — SODIUM CHLORIDE 0.9 % IV SOLN
Freq: Once | INTRAVENOUS | Status: AC
  Administered 2017-10-21: 12:00:00 via INTRAVENOUS
  Filled 2017-10-21: qty 5

## 2017-10-21 MED ORDER — SODIUM CHLORIDE 0.9 % IV SOLN
750.0000 mg | Freq: Once | INTRAVENOUS | Status: AC
  Administered 2017-10-21: 750 mg via INTRAVENOUS
  Filled 2017-10-21: qty 75

## 2017-10-21 MED ORDER — DIPHENHYDRAMINE HCL 50 MG/ML IJ SOLN
INTRAMUSCULAR | Status: AC
  Filled 2017-10-21: qty 1

## 2017-10-21 MED ORDER — PALONOSETRON HCL INJECTION 0.25 MG/5ML
INTRAVENOUS | Status: AC
  Filled 2017-10-21: qty 5

## 2017-10-21 MED ORDER — SODIUM CHLORIDE 0.9 % IV SOLN
140.0000 mg/m2 | Freq: Once | INTRAVENOUS | Status: AC
  Administered 2017-10-21: 300 mg via INTRAVENOUS
  Filled 2017-10-21: qty 50

## 2017-10-21 MED ORDER — FAMOTIDINE IN NACL 20-0.9 MG/50ML-% IV SOLN
20.0000 mg | Freq: Once | INTRAVENOUS | Status: AC
  Administered 2017-10-21: 20 mg via INTRAVENOUS

## 2017-10-21 MED ORDER — HEPARIN SOD (PORK) LOCK FLUSH 100 UNIT/ML IV SOLN
500.0000 [IU] | Freq: Once | INTRAVENOUS | Status: AC | PRN
  Administered 2017-10-21: 500 [IU]
  Filled 2017-10-21: qty 5

## 2017-10-21 MED ORDER — SODIUM CHLORIDE 0.9 % IV SOLN
Freq: Once | INTRAVENOUS | Status: AC
  Administered 2017-10-21: 11:00:00 via INTRAVENOUS
  Filled 2017-10-21: qty 250

## 2017-10-21 MED ORDER — TRAMADOL HCL 50 MG PO TABS: 50.0000 mg | ORAL_TABLET | Freq: Four times a day (QID) | ORAL | 0 refills | Status: DC | PRN

## 2017-10-21 MED ORDER — SODIUM CHLORIDE 0.9% FLUSH
10.0000 mL | Freq: Once | INTRAVENOUS | Status: AC
  Administered 2017-10-21: 10 mL
  Filled 2017-10-21: qty 10

## 2017-10-21 MED ORDER — DIPHENHYDRAMINE HCL 50 MG/ML IJ SOLN
50.0000 mg | Freq: Once | INTRAMUSCULAR | Status: AC
  Administered 2017-10-21: 50 mg via INTRAVENOUS

## 2017-10-21 MED ORDER — FAMOTIDINE IN NACL 20-0.9 MG/50ML-% IV SOLN
INTRAVENOUS | Status: AC
  Filled 2017-10-21: qty 50

## 2017-10-21 MED ORDER — SODIUM CHLORIDE 0.9% FLUSH
10.0000 mL | INTRAVENOUS | Status: DC | PRN
  Administered 2017-10-21: 10 mL
  Filled 2017-10-21: qty 10

## 2017-10-21 MED ORDER — PALONOSETRON HCL INJECTION 0.25 MG/5ML
0.2500 mg | Freq: Once | INTRAVENOUS | Status: AC
  Administered 2017-10-21: 0.25 mg via INTRAVENOUS

## 2017-10-21 NOTE — Telephone Encounter (Signed)
Gave pt avs and calendar  °

## 2017-10-21 NOTE — Patient Instructions (Signed)
   [facility] Discharge Instructions for Patients Receiving Chemotherapy  Today you received the following chemotherapy agents Taxol and Carboplatin   To help prevent nausea and vomiting after your treatment, we encourage you to take your nausea medication as directed.    If you develop nausea and vomiting that is not controlled by your nausea medication, call the clinic.   BELOW ARE SYMPTOMS THAT SHOULD BE REPORTED IMMEDIATELY:  *FEVER GREATER THAN 100.5 F  *CHILLS WITH OR WITHOUT FEVER  NAUSEA AND VOMITING THAT IS NOT CONTROLLED WITH YOUR NAUSEA MEDICATION  *UNUSUAL SHORTNESS OF BREATH  *UNUSUAL BRUISING OR BLEEDING  TENDERNESS IN MOUTH AND THROAT WITH OR WITHOUT PRESENCE OF ULCERS  *URINARY PROBLEMS  *BOWEL PROBLEMS  UNUSUAL RASH Items with * indicate a potential emergency and should be followed up as soon as possible.  Feel free to call the clinic should you have any questions or concerns. The clinic phone number is [phone].  Please show the CHEMO ALERT CARD at check-in to the Emergency Department and triage nurse.   

## 2017-10-22 ENCOUNTER — Encounter: Payer: Self-pay | Admitting: Hematology and Oncology

## 2017-10-22 DIAGNOSIS — T451X5A Adverse effect of antineoplastic and immunosuppressive drugs, initial encounter: Secondary | ICD-10-CM

## 2017-10-22 DIAGNOSIS — G620 Drug-induced polyneuropathy: Secondary | ICD-10-CM | POA: Insufficient documentation

## 2017-10-22 NOTE — Assessment & Plan Note (Signed)
Unfortunately, recent ultrasound fine-needle aspirate and biopsy performed at [facility] revealed adenocarcinoma consistent with pancreatic primary I have discussed this with my colleagues I will contact Dr. Carlis Abbott to arrange for surgery soon My plan would be to proceed with cycle 2 of chemotherapy and then order repeat MRI of her pancreas in 2 weeks If she has positive response to therapy, we can decide either to proceed with surgery or continue 1 more cycle of chemotherapy before interruption to proceed with pancreatic surgery However, if she does not respond to chemotherapy, we can consider either switching her to modified FOLFIRINOX versus proceed with pancreatic surgery if she is still a surgical candidate I reviewed the current NCCN guidelines with the patient and she ultimately agreed with the plan of care

## 2017-10-22 NOTE — Assessment & Plan Note (Signed)
She tolerated adjuvant treatment well except for mild neuropathy and bone pain I plan to reduce the dose of Taxol a little bit and prescribed medications for her to cope with pain We will continue treatment cycle #2 as scheduled

## 2017-10-22 NOTE — Progress Notes (Signed)
[place] OFFICE PROGRESS NOTE  Patient Care Team: [name], MD as PCP - General (Family Medicine)  ASSESSMENT & PLAN:  Secondary malignant neoplasm of right ovary [facility]) She tolerated adjuvant treatment well except for mild neuropathy and bone pain I plan to reduce the dose of Taxol a little bit and prescribed medications for her to cope with pain We will continue treatment cycle #2 as scheduled  Pancreatic cancer [facility]) Unfortunately, recent ultrasound fine-needle aspirate and biopsy performed at [facility] revealed adenocarcinoma consistent with pancreatic primary I have discussed this with my colleagues I will contact Dr. Carlis Abbott to arrange for surgery soon My plan would be to proceed with cycle 2 of chemotherapy and then order repeat MRI of her pancreas in 2 weeks If she has positive response to therapy, we can decide either to proceed with surgery or continue 1 more cycle of chemotherapy before interruption to proceed with pancreatic surgery However, if she does not respond to chemotherapy, we can consider either switching her to modified FOLFIRINOX versus proceed with pancreatic surgery if she is still a surgical candidate I reviewed the current NCCN guidelines with the patient and she ultimately agreed with the plan of care  Peripheral neuropathy due to chemotherapy [facility]) she has mild peripheral neuropathy, likely related to side effects of treatment. I plan to reduce the dose of treatment as outlined above.  I explained to the patient the rationale of this strategy and reassured the patient it would not compromise the efficacy of treatment    Orders Placed This Encounter  Procedures  . MR ABDOMEN MRCP W WO CONTAST    Standing Status:   Future    Standing Expiration Date:   12/22/2018    Order Specific Question:   If indicated for the ordered procedure, I authorize the administration of contrast media per Radiology protocol    Answer:    Yes    Order Specific Question:   What is the patient's sedation requirement?    Answer:   No Sedation    Order Specific Question:   Does the patient have a pacemaker or implanted devices?    Answer:   No    Order Specific Question:   Radiology Contrast Protocol - do NOT remove file path    Answer:   [email or web address]    Order Specific Question:   Preferred imaging location?    Answer:   [facility] (table limit-350 lbs)  . Cancer antigen 19-9    Standing Status:   Future    Standing Expiration Date:   11/25/2018    INTERVAL HISTORY: Please see below for problem oriented charting. She returns for cycle 2 of chemotherapy I have review her outside records Unfortunately, recent fine-needle aspirate and biopsy confirmed evidence of pancreatic cancer She is very tearful In the meantime, she continues to have mild intermittent lower back pain that does not bother her She has complained of significant bone pain and signs of neuropathy from cycle 1 of treatment Denies constipation, nausea or recent infection  SUMMARY OF ONCOLOGIC HISTORY: Oncology History   MSI positive  Endometrial :endometrioid Ovarian: Endometrioid      Endometrial cancer ([place])   08/18/2017 Imaging    Ct scan abdomen and pelvis 1. Mixed attenuation mass emanates from the right adnexa measuring 10.8 x 8.0 cm very suspicious for right ovarian carcinoma. 2. Abnormality of the tail of the pancreas may be due to mild pancreatitis and small pseudocyst formation, but neoplasm  cannot be excluded. 3. Small amount of ascites within abdomen and pelvis. 4. Small nonobstructing renal calculi bilaterally.     08/30/2017 Pathology Results    1. Uterus and cervix, with left fallopian tube - ENDOMETRIOID ADENOCARCINOMA, FIGO GRADE I, ARISING IN A BACKGROUND OF DIFFUSE COMPLEX ATYPICAL HYPERPLASIA. - CARCINOMA INVADES FOR OF DEPTH OF 0.2 CM WHERE THICKNESS OF MYOMETRIAL WALL IS 2.1 CM. - ALL  RESECTION MARGINS ARE NEGATIVE FOR CARCINOMA. - NEGATIVE FOR LYMPHOVASCULAR OR PERINEURAL INVASION. - CERVICAL STROMA IS NOT INVOLVED. - SEE ONCOLOGY TABLE. - SEE NOTE 2. Ovary and fallopian tube, right - PRIMARY OVARIAN ENDOMETRIOID ADENOCARCINOMA, FIGO GRADE II, 12 CM. - THE OVARIAN SURFACE IS FOCALLY INVOLVED BY CARCINOMA. - NEGATIVE FOR LYMPHOVASCULAR INVASION. - BENIGN UNREMARKABLE FALLOPIAN TUBE, NEGATIVE FOR CARCINOMA. - SEE ONCOLOGY TABLE. - SEE NOTE 3. Cul-de-sac biopsy - METASTATIC ADENOCARCINOMA, MOST CONSISTENT WITH PRIMARY OVARIAN ENDOMETRIOID ADENOCARCINOMA. 4. Ovary, left - BENIGN UNREMARKABLE OVARY, NEGATIVE FOR MALIGNANCY. Microscopic Comment 1. UTERUS, CARCINOMA OR CARCINOSARCOMA Procedure: Total hysterectomy with bilateral salpingo-oophorectomy. Histologic type: Endometrioid adenocarcinoma. Histologic Grade: FIGO Grade I Myometrial invasion: Depth of invasion: 2 mm Myometrial thickness: 21 mm Uterine Serosa Involvement: Not identified Cervical stromal involvement: Not identified Extent of involvement of other organs: Not applicable Lymphovascular invasion: Not identified Regional Lymph Nodes: Examined: 0 Sentinel 0 Non-sentinel 0 Total Tumor block for ancillary studies: 1H MMR / MSI testing: Pending Pathologic Stage Classification (pTNM, AJCC 8th edition): pT1a, pNX (v4.1.0.0) 2. OVARY or FALLOPIAN TUBE or PRIMARY PERITONEUM: Procedure: Salpingo-oophorectomy Specimen Integrity: Intact Tumor Site: Right ovary Ovarian Surface Involvement (required only if applicable): Focally involved by carcinoma Fallopian Tube Surface Involvement (required only if applicable): Not identified Tumor Size: 12 cm Histologic Type: Endometrioid adenocarcinoma Histologic Grade: Grade II Implants (required for advanced stage serous/seromucinous borderline tumors only): Not applicable Other Tissue/ Organ Involvement: Cul de sac biopsy involved by tumor Largest Extrapelvic  Peritoneal Focus (required only if applicable): Not applicable Peritoneal/Ascitic Fluid: Negative for carcinoma (case # [id number]) Treatment Effect (required only for high-grade serous carcinomas): Not applicable Regional Lymph Nodes: No lymph nodes submitted or found Number of Lymph Nodes Examined: 0 Pathologic Stage Classification (pTNM, AJCC 8th Edition): pT2b, pN0 Representative Tumor Block: 2B and 2E 1. Molecular study for microsatellite instability and immunohistochemical stains for MMR-related proteins are pending and will be reported in an addendum. 2. Immunohistochemical stain show that the ovarian tumor is positive for CK7 and PAX8 (both diffuse), CDX2 (patchy and weak); and negative for CK20. This immunoprofile is consistent with the above diagnosis. Dr. Lyndon Code has reviewed this case and concurs with the above diagnosis. Molecular study for microsatellite instability and immunohistochemical stains for MMR-related proteins are pending and will be reported in an addendum    08/30/2017 Genetic Testing    Patient has genetic testing done for MSI  Results revealed patient has the following mutation(s): loss of [facility] 2    08/30/2017 Surgery    Surgeon: Mart Piggs, MD Pre-operative Diagnosis:  1. Adnexal mass 2. Abnormal uterine bleeding 3. H/o Cecal CA  Post-operative Diagnosis:  1. Adhesive disease post colon resection 2. Endometrial cancer NOS 3. Adenocarcinoma unknown origin, right ovary, suspicious for GI primary  Operation:  1. Lysis of adhesions ~30 minutes 2. Robotic-assisted laparoscopic total hysterectomy with right salpingo-oophorectomy and left salpingectomy 3. Left oophorectomy (RA-laparoscopic) 4. Pelvic washings  Findings: Adhesions of omentum to anterior abdominal wall. Enlarged cystic right ovary ~10cm. Uterus had small nodules on serosa near where right adnexa was intimate  with the surface. No obvious intraoperative rupture of cyst, although in 2 areas  the wall was thin and one of these areas had some bleeding. Slight scarring of left bladder dome to LUS/cervix. Uterus on frozen section c/w hyperplasia and a small focus of endometrial CA - no myo invasion, <2cm in size. Frozen section on the right adnexa was carcinoma, met from colon or possibly Gyn, favor GI primary, defer to permanent. Left ovary was WNL.      09/15/2017 Cancer Staging    Staging form: Corpus Uteri - Carcinoma and Carcinosarcoma, AJCC 8th Edition - Pathologic: FIGO Stage IA (pT1a, pN0, cM0) - Signed by Heath Lark, MD on 09/15/2017    09/26/2017 Procedure    Successful placement of a right internal jugular approach power injectable Port-A-Cath. The catheter is ready for immediate use.     Secondary malignant neoplasm of right ovary ([place])   08/23/2017 Tumor Marker    Patient's tumor was tested for the following markers: CA-125 Results of the tumor marker test revealed 139.8    08/31/2017 Initial Diagnosis    Secondary malignant neoplasm of right ovary ([place])    09/15/2017 Cancer Staging    Staging form: Ovary, Fallopian Tube, and Primary Peritoneal Carcinoma, AJCC 8th Edition - Pathologic: Stage IIB (pT2b, pN0, cM0) - Signed by Heath Lark, MD on 09/15/2017    09/27/2017 Imaging    No evidence of metastatic disease or other acute findings within the thorax.  4 cm low-attenuation mass in pancreatic tail, highly suspicious for pancreatic carcinoma. This is caused splenic vein thrombosis, with new venous collaterals in the left upper quadrant. Consider endoscopic ultrasound with FNA for tissue diagnosis.  Stable benign hepatic hemangioma.     09/27/2017 Tumor Marker    Patient's tumor was tested for the following markers: CA-125 Results of the tumor marker test revealed 39.4     Pancreatic cancer ([place])   10/13/2017 Pathology Results    Pancreas tail mass, endoscopic ultrasound-guided, fine needle aspiration II (smears and cell block): Adenocarcinoma     REVIEW  OF SYSTEMS:   Constitutional: Denies fevers, chills or abnormal weight loss Eyes: Denies blurriness of vision Ears, nose, mouth, throat, and face: Denies mucositis or sore throat Respiratory: Denies cough, dyspnea or wheezes Cardiovascular: Denies palpitation, chest discomfort or lower extremity swelling Gastrointestinal:  Denies nausea, heartburn or change in bowel habits Skin: Denies abnormal skin rashes Lymphatics: Denies new lymphadenopathy or easy bruising Neurological:Denies numbness, tingling or new weaknesses Behavioral/Psych: Mood is stable, no new changes  All other systems were reviewed with the patient and are negative.  I have reviewed the past medical history, past surgical history, social history and family history with the patient and they are unchanged from previous note.  ALLERGIES:  is allergic to codeine; penicillins; and bactrim [sulfamethoxazole-trimethoprim].  MEDICATIONS:  Current Outpatient Medications  Medication Sig Dispense Refill  . acetaminophen (TYLENOL) 650 MG CR tablet Take 1,300 mg by mouth every 8 (eight) hours.    Marland Kitchen dexamethasone (DECADRON) 4 MG tablet Take 5 tabs at the night before and 5 tab the morning of chemotherapy, every 3 weeks, by mouth (Patient taking differently: Take 20 mg by mouth See admin instructions. Take 5 tabs at the night before and 5 tab the morning of chemotherapy, every 3 weeks, by mouth) 60 tablet 0  . diclofenac sodium (VOLTAREN) 1 % GEL APPLY 3 GM TO AFFECTED 3 LARGE JOINTS UP TO 3 TIMES A DAY AS NEEDED AS DIRECTED (Patient taking differently: Apply 2  g topically 3 (three) times daily as needed (pain). ) 300 g 1  . fluticasone (FLONASE) 50 MCG/ACT nasal spray Place 2 sprays into both nostrils daily.    Marland Kitchen ibuprofen (ADVIL,MOTRIN) 600 MG tablet Take 1 tablet (600 mg total) by mouth every 6 (six) hours. 30 tablet 0  . lidocaine-prilocaine (EMLA) cream Apply to affected area once (Patient taking differently: Apply 1 application  topically daily as needed (port). Apply to affected area once) 30 g 3  . loratadine (CLARITIN) 10 MG tablet Take 10 mg by mouth daily after breakfast.    . nitrofurantoin, macrocrystal-monohydrate, (MACROBID) 100 MG capsule Take 1 capsule (100 mg total) by mouth 2 (two) times daily. 20 capsule 0  . omeprazole (PRILOSEC) 20 MG capsule Take 20 mg by mouth 2 (two) times daily before a meal.     . ondansetron (ZOFRAN) 8 MG tablet Take 1 tablet (8 mg total) by mouth every 8 (eight) hours as needed for refractory nausea / vomiting. Start on day 3 after chemo. 30 tablet 1  . phenazopyridine (PYRIDIUM) 200 MG tablet Take 1 tablet (200 mg total) by mouth 3 (three) times daily as needed for pain. 10 tablet 1  . predniSONE (DELTASONE) 10 MG tablet TAKE 1 TABLET BY MOUTH EVERY DAY WITH BREAKFAST 30 tablet 0  . prochlorperazine (COMPAZINE) 10 MG tablet Take 1 tablet (10 mg total) by mouth every 6 (six) hours as needed (Nausea or vomiting). 30 tablet 1  . simethicone (MYLICON) 782 MG chewable tablet Chew 125 mg by mouth every 6 (six) hours as needed for flatulence.    . traMADol (ULTRAM) 50 MG tablet Take 1 tablet (50 mg total) by mouth every 6 (six) hours as needed. 30 tablet 0   No current facility-administered medications for this visit.     PHYSICAL EXAMINATION: ECOG PERFORMANCE STATUS: 1 - Symptomatic but completely ambulatory  Vitals:   10/21/17 1028  BP: (!) 151/92  Pulse: 87  Resp: 13  Temp: 98.4 F (36.9 C)  SpO2: 99%   Filed Weights   10/21/17 1028  Weight: 229 lb 1.6 oz (103.9 kg)    GENERAL:alert, no distress and comfortable SKIN: skin color, texture, turgor are normal, no rashes or significant lesions EYES: normal, Conjunctiva are pink and non-injected, sclera clear OROPHARYNX:no exudate, no erythema and lips, buccal mucosa, and tongue normal  NECK: supple, thyroid normal size, non-tender, without nodularity LYMPH:  no palpable lymphadenopathy in the cervical, axillary or  inguinal LUNGS: clear to auscultation and percussion with normal breathing effort HEART: regular rate & rhythm and no murmurs and no lower extremity edema ABDOMEN:abdomen soft, non-tender and normal bowel sounds Musculoskeletal:no cyanosis of digits and no clubbing  NEURO: alert & oriented x 3 with fluent speech, no focal motor/sensory deficits  LABORATORY DATA:  I have reviewed the data as listed    Component Value Date/Time   NA 143 10/21/2017 0939   K 3.8 10/21/2017 0939   CL 106 10/21/2017 0939   CO2 23 10/21/2017 0939   GLUCOSE 214 (H) 10/21/2017 0939   BUN 13 10/21/2017 0939   CREATININE 0.72 10/21/2017 0939   CREATININE 0.68 07/27/2017 0851   CALCIUM 9.6 10/21/2017 0939   PROT 7.4 10/21/2017 0939   ALBUMIN 3.5 10/21/2017 0939   AST 10 (L) 10/21/2017 0939   ALT 15 10/21/2017 0939   ALKPHOS 51 10/21/2017 0939   BILITOT <0.2 (L) 10/21/2017 0939   GFRNONAA >60 10/21/2017 0939   GFRNONAA 106 07/27/2017 0851   GFRAA >  60 10/21/2017 0939   GFRAA 123 07/27/2017 0851    No results found for: SPEP, UPEP  Lab Results  Component Value Date   WBC 10.2 10/21/2017   NEUTROABS 9.3 (H) 10/21/2017   HGB 10.8 (L) 10/21/2017   HCT 33.7 (L) 10/21/2017   MCV 87.4 10/21/2017   PLT 288 10/21/2017      Chemistry      Component Value Date/Time   NA 143 10/21/2017 0939   K 3.8 10/21/2017 0939   CL 106 10/21/2017 0939   CO2 23 10/21/2017 0939   BUN 13 10/21/2017 0939   CREATININE 0.72 10/21/2017 0939   CREATININE 0.68 07/27/2017 0851      Component Value Date/Time   CALCIUM 9.6 10/21/2017 0939   ALKPHOS 51 10/21/2017 0939   AST 10 (L) 10/21/2017 0939   ALT 15 10/21/2017 0939   BILITOT <0.2 (L) 10/21/2017 0939       RADIOGRAPHIC STUDIES: I have personally reviewed the radiological images as listed and agreed with the findings in the report. Dg Chest 2 View  Result Date: 10/09/2017 CLINICAL DATA:  Initial evaluation for acute chest pain. EXAM: CHEST - 2 VIEW COMPARISON:   Prior radiograph from 02/20/2007. FINDINGS: Right-sided Port-A-Cath in place with tip overlying the cavoatrial junction. Cardiac and mediastinal silhouettes within normal limits. Lungs normally inflated. No focal infiltrates. No pulmonary edema or pleural effusion. No pneumothorax. No acute osseous abnormality. IMPRESSION: No active cardiopulmonary disease. Electronically Signed   By: Jeannine Boga M.D.   On: 10/09/2017 00:11   Ct Chest W Contrast  Result Date: 09/27/2017 CLINICAL DATA:  Newly diagnosed right ovarian carcinoma.  Staging. EXAM: CT CHEST WITH CONTRAST TECHNIQUE: Multidetector CT imaging of the chest was performed during intravenous contrast administration. CONTRAST:  22m OMNIPAQUE IOHEXOL 300 MG/ML  SOLN COMPARISON:  Abdomen MRI on 09/16/2017, and chest CT on 01/16/2007 FINDINGS: Cardiovascular:  No acute findings. Mediastinum/Nodes: No masses or pathologically enlarged lymph nodes identified. Lungs/Pleura: No pulmonary infiltrate or mass identified. No effusion present. Upper Abdomen: Stable benign hemangioma in the left hepatic lobe measuring approximately 3 cm. Low-attenuation mass in the pancreatic tail measures 4.1 x 3.2 cm, as seen on recent MRI, highly suspicious for pancreatic carcinoma. This has caused splenic vein thrombosis with new venous collaterals in the left upper quadrant. Musculoskeletal:  No suspicious bone lesions. IMPRESSION: No evidence of metastatic disease or other acute findings within the thorax. 4 cm low-attenuation mass in pancreatic tail, highly suspicious for pancreatic carcinoma. This is caused splenic vein thrombosis, with new venous collaterals in the left upper quadrant. Consider endoscopic ultrasound with FNA for tissue diagnosis. Stable benign hepatic hemangioma. Electronically Signed   By: JEarle GellM.D.   On: 09/27/2017 14:40   Ct Angio Chest Pe W Or Wo Contrast  Result Date: 10/09/2017 CLINICAL DATA:  Chest pain. Chemotherapy for endometrial  cancer. Left-sided chest pain for 3 days. EXAM: CT ANGIOGRAPHY CHEST WITH CONTRAST TECHNIQUE: Multidetector CT imaging of the chest was performed using the standard protocol during bolus administration of intravenous contrast. Multiplanar CT image reconstructions and MIPs were obtained to evaluate the vascular anatomy. CONTRAST:  1016mISOVUE-370 IOPAMIDOL (ISOVUE-370) INJECTION 76% COMPARISON:  09/27/2017 FINDINGS: Cardiovascular: Good opacification of the central and segmental pulmonary arteries. No focal filling defects. No evidence of significant pulmonary embolus. Normal heart size. No pericardial effusion. Normal caliber thoracic aorta. No evidence of dissection. Great vessel origins are patent. Mediastinum/Nodes: Esophagus is decompressed. No significant lymphadenopathy in the chest. Thyroid gland is  unremarkable. Lungs/Pleura: Mild dependent changes in the lungs. Lungs are otherwise clear and expanded. No pleural effusions. No pneumothorax. Airways are patent. Upper Abdomen: Focal fullness at the junction of the body and tail of the pancreas with surrounding infiltration. This is suspicious for pancreatic neoplasm, measuring about 2.7 cm diameter. Similar changes present on previous study. Tissue sampling was recommended at that study. Possible small lymph nodes in the celiac axis. Musculoskeletal: No destructive bone lesions. Review of the MIP images confirms the above findings. IMPRESSION: 1. No evidence of significant pulmonary embolus. 2. No evidence of active pulmonary disease. 3. Focal mass at the junction of the body and tail of the pancreas with surrounding infiltration. This is suspicious for pancreatic neoplasm. Tissue sampling was recommended on previous study. Electronically Signed   By: Lucienne Capers M.D.   On: 10/09/2017 03:09   Ir Imaging Guided Port Insertion  Result Date: 09/26/2017 INDICATION: History of endometrial and ovarian cancer, in need of durable intravenous access for  chemotherapy administration. EXAM: IMPLANTED PORT A CATH PLACEMENT WITH ULTRASOUND AND FLUOROSCOPIC GUIDANCE COMPARISON:  None. MEDICATIONS: Clindamycin 600 mg IV; The antibiotic was administered within an appropriate time interval prior to skin puncture. ANESTHESIA/SEDATION: Moderate (conscious) sedation was employed during this procedure. A total of Versed 4 mg and Fentanyl 100 mcg was administered intravenously. Moderate Sedation Time: 27 minutes. The patient's level of consciousness and vital signs were monitored continuously by radiology nursing throughout the procedure under my direct supervision. CONTRAST:  None FLUOROSCOPY TIME:  18 seconds (5 mGy) COMPLICATIONS: None immediate. PROCEDURE: The procedure, risks, benefits, and alternatives were explained to the patient. Questions regarding the procedure were encouraged and answered. The patient understands and consents to the procedure. The right neck and chest were prepped with chlorhexidine in a sterile fashion, and a sterile drape was applied covering the operative field. Maximum barrier sterile technique with sterile gowns and gloves were used for the procedure. A timeout was performed prior to the initiation of the procedure. Local anesthesia was provided with 1% lidocaine with epinephrine. After creating a small venotomy incision, a micropuncture kit was utilized to access the internal jugular vein. Real-time ultrasound guidance was utilized for vascular access including the acquisition of a permanent ultrasound image documenting patency of the accessed vessel. The microwire was utilized to measure appropriate catheter length. A subcutaneous port pocket was then created along the upper chest wall utilizing a combination of sharp and blunt dissection. The pocket was irrigated with sterile saline. A single lumen power injectable port was chosen for placement. The 8 Fr catheter was tunneled from the port pocket site to the venotomy incision. The port was  placed in the pocket. The external catheter was trimmed to appropriate length. At the venotomy, an 8 Fr peel-away sheath was placed over a guidewire under fluoroscopic guidance. The catheter was then placed through the sheath and the sheath was removed. Final catheter positioning was confirmed and documented with a fluoroscopic spot radiograph. The port was accessed with a Huber needle, aspirated and flushed with heparinized saline. The venotomy site was closed with an interrupted 4-0 Vicryl suture. The port pocket incision was closed with interrupted 2-0 Vicryl suture and the skin was opposed with a running subcuticular 4-0 Vicryl suture. Dermabond and Steri-strips were applied to both incisions. Dressings were placed. The patient tolerated the procedure well without immediate post procedural complication. FINDINGS: After catheter placement, the tip lies within the superior cavoatrial junction. The catheter aspirates and flushes normally and is ready for  immediate use. IMPRESSION: Successful placement of a right internal jugular approach power injectable Port-A-Cath. The catheter is ready for immediate use. Electronically Signed   By: Sandi Mariscal M.D.   On: 09/26/2017 17:11    All questions were answered. The patient knows to call the clinic with any problems, questions or concerns. No barriers to learning was detected.  I spent 40 minutes counseling the patient face to face. The total time spent in the appointment was 55 minutes and more than 50% was on counseling and review of test results  Heath Lark, MD 10/22/2017 6:34 AM

## 2017-10-22 NOTE — Assessment & Plan Note (Signed)
she has mild peripheral neuropathy, likely related to side effects of treatment. °I plan to reduce the dose of treatment as outlined above.  °I explained to the patient the rationale of this strategy and reassured the patient it would not compromise the efficacy of treatment ° °

## 2017-10-25 ENCOUNTER — Telehealth: Payer: Self-pay | Admitting: *Deleted

## 2017-10-25 ENCOUNTER — Other Ambulatory Visit: Payer: Self-pay | Admitting: Hematology and Oncology

## 2017-10-25 ENCOUNTER — Telehealth: Payer: Self-pay | Admitting: Hematology and Oncology

## 2017-10-25 DIAGNOSIS — D490 Neoplasm of unspecified behavior of digestive system: Secondary | ICD-10-CM

## 2017-10-25 NOTE — Telephone Encounter (Signed)
I discussed with the surgeon. He recommended CT scan of the abdomen and pelvis with contrast instead of MRCP. I will proceed to get it scheduled to be done on November 07, 2017 and will forward the results to him once results are available

## 2017-10-25 NOTE — Telephone Encounter (Signed)
-----   Message from Heath Lark, MD sent at 10/25/2017  9:53 AM EDT ----- Regarding: Dr. Carlis Abbott Pls tell her I have spoken with Dr. Carlis Abbott. He recommends Ct abdomen instead of MRI and will cancel the order at [facility] I will call him with test results Please coordinate with radiology to schedule it now if possible and let her know the plan of care

## 2017-10-25 NOTE — Telephone Encounter (Signed)
-----   Message from Elliot Gault sent at 10/25/2017  2:54 PM EDT ----- Per [employer] online, auth# [id number]. Valid 10/25/17-11/23/17. [id number] - CT abd/pelvis  ----- Message ----- From: Patton Salles, RN Sent: 10/25/2017  10:06 AM EDT To: Brandi Harvell  Needs CT Abdomen and Pelvis

## 2017-10-25 NOTE — Telephone Encounter (Signed)
Notified of message below

## 2017-10-26 ENCOUNTER — Telehealth: Payer: Self-pay | Admitting: *Deleted

## 2017-10-26 NOTE — Telephone Encounter (Signed)
Called pt per Dr Alvy Bimler request to verify reason for CT schedule.  Explained to pt that CT is scheduled after MD visit & Dr wants to be able to discuss with her results at that visit so this won't work.  Pt is OK for change.  Called Central Scheduling & moved CT to 9/16 @ 10:30 am.  Notified pt via vm of time & date change & NPO 4 hours prior & to pick up contrast if not already done & will have to drink contrast while here for labs. Pt reported earlier with this call that she had a h/a yest R side of head that was intermittent.  She is better today so far.  She denies any other symptoms with h/a such as dizziness/blurred vision. She also reports trouble sleeping & asked about melatonin or benadryl. Informed that these should be OK to try & if doesn't help to let us know.  She also reports constipation & is taking stool softeners & will let us know if this doesn't help.

## 2017-10-31 NOTE — Progress Notes (Signed)
Office Visit Note  Patient: Dana Hicks             Date of Birth: 02/20/1976           MRN: [id number]             PCP: [name], MD Referring: [name], MD Visit Date: 11/14/2017 Occupation: '@GUAROCC'$ @  Subjective:  Pain in both knees   History of Present Illness: Dana Hicks is a 42 y.o. female with history of seropositive rheumatoid arthritis and osteoarthritis. She is taking Prednisone 5 mg by mouth daily.  She presents with pain in both knee joints today.  She states that she has had swelling as well as warmth in both knees. The pain is most severe in the left knee joint.  She states that she has been started on Dilaudid which she has been taking at night.  Reports sleep at night due to the level of pain in both knees.  She is also been walking with a cane.  She states that due to the gait disturbance she has been having increased lower back pain.  She occasionally has stiffness in the right elbow joint but denies any other joint pain or joint swelling at this time.  She states that after expression of chemotherapy showed neuropathy in both feet worse in the right foot.  She states that before her second round of chemotherapy she iced her feet which prevented the neuropathy from returning.  She states that on 11/25/2017 she will be having pancreatic surgery as well as splenectomy.  Her surgical oncologist has not had any dose of prednisone 5 mg daily and she should not be taking any NSAIDs.  She has been taking Tylenol for pain relief during the day as well as icing her knee joints on a regular basis.     Activities of Daily Living:  Patient reports morning stiffness for 5  minutes.   Patient Reports nocturnal pain.  Difficulty dressing/grooming: Denies Difficulty climbing stairs: Reports Difficulty getting out of chair: Reports Difficulty using hands for taps, buttons, cutlery, and/or writing: Denies  Review of Systems  Constitutional: Positive for fatigue.    HENT: Negative for mouth sores, mouth dryness and nose dryness.   Eyes: Positive for dryness. Negative for pain and visual disturbance.  Respiratory: Negative for cough, hemoptysis, shortness of breath and difficulty breathing.   Cardiovascular: Negative for chest pain, palpitations, hypertension and swelling in legs/feet.  Gastrointestinal: Negative for blood in stool, constipation and diarrhea.  Endocrine: Positive for increased urination (On Macrobid).  Genitourinary: Negative for painful urination.  Musculoskeletal: Positive for arthralgias, joint pain, joint swelling, myalgias, morning stiffness and myalgias. Negative for muscle weakness and muscle tenderness.  Skin: Negative for color change, pallor, rash, hair loss, nodules/bumps, skin tightness, ulcers and sensitivity to sunlight.  Allergic/Immunologic: Negative for susceptible to infections.  Neurological: Negative for dizziness, numbness, headaches and weakness.  Hematological: Negative for swollen glands.  Psychiatric/Behavioral: Positive for sleep disturbance. Negative for depressed mood. The patient is not nervous/anxious.     PMFS History:  Patient Active Problem List   Diagnosis Date Noted  . Inflammatory arthritis 11/08/2017  . Dysuria 11/02/2017  . Peripheral neuropathy due to chemotherapy ([place]) 10/22/2017  . Bone pain 10/21/2017  . Family history of colon cancer   . Pancreatic cancer ([place]) 10/17/2017  . Family history of uterine cancer 10/17/2017  . Sinusitis, chronic 09/15/2017  . Lesion of pancreas 09/15/2017  . Endometrial cancer ([place]) 08/31/2017  . Secondary  malignant neoplasm of right ovary ([place]) 08/31/2017  . Pustular folliculitis 79/39/0300  . MRSA colonization 05/12/2017  . Left foot pain 01/10/2017  . Osteoarthritis of both knees 12/01/2016  . Class 3 severe obesity due to excess calories without serious comorbidity with body mass index (BMI) of 40.0 to 44.9 in adult ([place]) 10/01/2016  . High risk  medication use 02/06/2016  . Family history of rheumatoid arthritis 02/06/2016  . H/O seasonal allergies 02/06/2016  . Rheumatoid arthritis of multiple sites with negative rheumatoid factor ([place]) 02/03/2016  . History of colon cancer 02/03/2016  . HLA B27 (HLA B27 positive) 02/03/2016    Past Medical History:  Diagnosis Date  . Allergic rhinitis, seasonal   . Cecal cancer ([place]) 2001   Stage II (T3N0)  04-11-2001cecum-partial colectomy and completed chemo 2002  . Endometrial cancer ([place]) 08/2017   Stage IA, Grade 1  . Family history of colon cancer   . Family history of uterine cancer   . History of MRSA infection 01/2017   followed by infectious disease center--  recurrent pustular folliculitis  . Nephrolithiasis    bilateral nonobstructive calculi per CT 08-18-2017  . OA (osteoarthritis)   . Ovarian cancer, right ([place]) 08/2017   Stage II Grade 2 Endometrioid  . Rheumatoid arthritis [facility])    rheumatologist-  dr devenswar-- treated w/ oral prednisone daily and methotrexate injection every 3 wks    Family History  Problem Relation Age of Onset  . Arthritis/Rheumatoid Mother   . Uterine cancer Mother 43  . Allergic rhinitis Father   . Heart disease Father   . Drug abuse Son   . Other Maternal Aunt        Unknown GYN CA  . Colon cancer Maternal Uncle 64  . Diabetes Maternal Grandmother   . Cancer Maternal Grandfather        d. 28s of liver/lung cancer  . Angioedema Neg Hx   . Asthma Neg Hx   . Atopy Neg Hx   . Eczema Neg Hx   . Immunodeficiency Neg Hx   . Urticaria Neg Hx    Past Surgical History:  Procedure Laterality Date  . BREAST LUMPECTOMY WITH RADIOACTIVE SEED LOCALIZATION Left 06/28/2014   Benign Procedure: RADIOACTIVE SEED LOCALIZATION LEFT BREAST LUMPECTOMY;  Surgeon: Excell Seltzer, MD;  Location: [facility];  Service: General;  Laterality: Left;  . COLONOSCOPY  06/19/14  . EYE SURGERY Left    plug in tear duct  . IR IMAGING GUIDED PORT  INSERTION  09/26/2017  . KNEE ARTHROSCOPY    . PORT A CATH REVISION  2001   in and out  . RIGHT COLECTOMY  06/02/2009   cecum cancer  . ROBOTIC ASSISTED TOTAL HYSTERECTOMY WITH BILATERAL SALPINGO OOPHERECTOMY Bilateral 08/30/2017   Procedure: XI ROBOTIC ASSISTED TOTAL  HYSTERECTOMY BILATERAL  SALPINGO OOPHORECTOMY; LYSIS OF ADHESIONS;  Surgeon: Isabel Caprice, MD;  Location: [facility];  Service: Gynecology;  Laterality: Bilateral;   Social History   Social History Narrative  . Not on file    Objective: Vital Signs: BP 124/76 (BP Location: Left Arm, Patient Position: Sitting, Cuff Size: Large)   Pulse 80   Resp 14   Ht '5\' 2"'$  (1.575 m)   Wt 226 lb 9.6 oz (102.8 kg)   LMP 08/30/2017   BMI 41.45 kg/m    Physical Exam  Constitutional: She is oriented to person, place, and time. She appears well-developed and well-nourished.  HENT:  Head: Normocephalic and atraumatic.  Eyes:  Conjunctivae and EOM are normal.  Neck: Normal range of motion.  Cardiovascular: Normal rate, regular rhythm, normal heart sounds and intact distal pulses.  Pulmonary/Chest: Effort normal and breath sounds normal.  Abdominal: Soft. Bowel sounds are normal.  Lymphadenopathy:    She has no cervical adenopathy.  Neurological: She is alert and oriented to person, place, and time.  Skin: Skin is warm and dry. Capillary refill takes less than 2 seconds.  Psychiatric: She has a normal mood and affect. Her behavior is normal.  Nursing note and vitals reviewed.    Musculoskeletal Exam: C-spine, thoracic spine, and lumbar spine good ROM.  No midline spinal tenderness.  No SI joint tenderness.  Shoulder joints, elbow joints, wrist joints, MCPs, PIPs, and DIPs good ROM with no synovitis.  Complete fist formation bilaterally.  Hip joints good ROM.  Full ROM of both knee joints.  Warmth of both knee joints noted.  No tenderness or swelling of ankle joints.    CDAI Exam: CDAI Score: 5  Patient Global Assessment: 5 (mm);  Provider Global Assessment: 5 (mm) Swollen: 2 ; Tender: 2  Joint Exam      Right  Left  Knee  Swollen Tender  Swollen Tender     Investigation: No additional findings.  Imaging: Dg Chest 2 View  Result Date: 11/02/2017 CLINICAL DATA:  Ovarian carcinoma.  Fever following chemotherapy EXAM: CHEST - 2 VIEW COMPARISON:  Chest radiograph October 08, 2017 and chest CT October 09, 2017 FINDINGS: Port-A-Cath tip is in the superior vena cava near the cavoatrial junction. No pneumothorax. Lungs are clear. Heart size and pulmonary vascularity are normal. No adenopathy. No bone lesions. IMPRESSION: No edema or consolidation.  No evident adenopathy. Electronically Signed   By: Lowella Grip III M.D.   On: 11/02/2017 10:50   Ct Abdomen Pelvis W Contrast  Result Date: 11/07/2017 CLINICAL DATA:  Right hemicolectomy for colon cancer in 2001. Appendectomy and chemotherapy. Pancreatic neoplasm diagnosed 7/19. Chemotherapy started 8/19. Known gallstones. EXAM: CT ABDOMEN AND PELVIS WITH CONTRAST TECHNIQUE: Multidetector CT imaging of the abdomen and pelvis was performed using the standard protocol following bolus administration of intravenous contrast. CONTRAST:  139m OMNIPAQUE IOHEXOL 300 MG/ML  SOLN COMPARISON:  09/16/2017 MRI.  CT 08/18/2017. FINDINGS: Lower chest: Clear lung bases. Normal heart size without pericardial or pleural effusion. Hepatobiliary: Left hepatic lobe hemangioma. No suspicious liver lesion. Normal gallbladder, without biliary ductal dilatation. Pancreas: The pancreatic head and neck are unremarkable. Again identified is vague hypoattenuation involving the pancreatic body with mild upstream atrophy. Somewhat difficult to quantify secondary to ill definition. Measures on the order of 3.0 x 2.3 cm on image 23/2. Most directly compared to 08/18/2017, where it measured 3.5 x 2.1 cm (when remeasured). There is mild peripancreatic edema, slightly improved. Spleen: Normal in size, without focal  abnormality. Adrenals/Urinary Tract: Normal adrenal glands. 5 mm inter/lower pole left renal collecting system calculus. No hydronephrosis. Normal urinary bladder. Stomach/Bowel: Normal stomach, without wall thickening. Right hemicolectomy. Normal small bowel. Vascular/Lymphatic: Aortic atherosclerosis. Splenic vein involvement by tumor with gastroepiploic collaterals. No abdominopelvic adenopathy. Reproductive: Hysterectomy.  No adnexal mass. Other: No significant free fluid. No evidence of omental or peritoneal disease. Musculoskeletal: No acute osseous abnormality. IMPRESSION: 1. Since 08/18/2017, similar to slight decrease in size of a pancreatic body/tail junction lesion. Cross modality comparison relative to 09/16/2017 MRI is also grossly similar. 2. No evidence of metastatic disease. 3.  Aortic Atherosclerosis (ICD10-I70.0). 4. Left nephrolithiasis. Electronically Signed   By: KAbigail Miyamoto  M.D.   On: 11/07/2017 13:57    Recent Labs: Lab Results  Component Value Date   WBC 7.6 11/11/2017   HGB 9.7 (L) 11/11/2017   PLT 249 11/11/2017   NA 139 11/11/2017   K 3.9 11/11/2017   CL 102 11/11/2017   CO2 28 11/11/2017   GLUCOSE 98 11/11/2017   BUN 10 11/11/2017   CREATININE 0.65 11/11/2017   BILITOT 0.2 (L) 11/07/2017   ALKPHOS 47 11/07/2017   AST 11 (L) 11/07/2017   ALT 15 11/07/2017   PROT 7.0 11/07/2017   ALBUMIN 3.4 (L) 11/07/2017   CALCIUM 9.5 11/11/2017   GFRAA >60 11/11/2017    Speciality Comments: No specialty comments available.  Procedures:  No procedures performed Allergies: Codeine; Penicillins; and Bactrim [sulfamethoxazole-trimethoprim]   Assessment / Plan:     Visit Diagnoses: Rheumatoid arthritis of multiple sites with negative rheumatoid factor ([place]): She has no synovitis on exam. She is having severe pain in both knee joints.  She has been taking tylenol and Dilaudid for pain relief. She is on Prednisone 5 mg po daily.  She has undergone 2 rounds of chemotherapy and  is scheduled for surgical resection of the pancreatic cancer and splenctomy on 11/25/17.  She was advised to notify us if she is cleared for cortisone injections of both knee joints after surgery.   HLA B27 (HLA B27 positive)  High risk medication use: She has had 2 rounds of chemotherapy and is scheduled for pancreatic surgery and splenectomy on 11/25/17.  Primary osteoarthritis of both knees: She is having severe pain in both knee joints. She has warmth on exam today and intermittent joint swelling.  She has been walking with a cane due to gait instability.  She is unable to take NSAIDs or use topical anti-inflammatories due to an upcoming surgery.  She has been icing her knee joints on a daily basis.  She takes Dilaudid 4 mg by mouth at bedtime.  She has been taking tylenol for pain relief during the day.   Family history of rheumatoid arthritis  Pancreatic neoplasm: She is scheduled for surgical resection on 11/25/17. CT showed regression of size of pancreatic cancer and it considered resectable at this time. She will be having a splenectomy at that time as well. She is on Prednisone 5 mg po daily.  She is not able to take NSAIDs at this time.   Secondary malignant neoplasm of right ovary: She is following closely with Dr. Alvy Bimler.  According to her last note on 11/08/2017 there is no sign of cancer recurrence.  Her tumor markers are within normal limits.  History of colon cancer: Right hemicolectomy in 2001.   H/O seasonal allergies   Orders: No orders of the defined types were placed in this encounter.  No orders of the defined types were placed in this encounter.    Follow-Up Instructions: Return for Rheumatoid arthritis, Osteoarthritis.   Ofilia Neas, PA-C  I examined and evaluated the patient with Hazel Sams PA.  Patient had no synovitis on examination.  She continues to have some discomfort in her knee joints.  Which I believe is due to osteoarthritis.  I detailed discussion  with the patient today.  As long as she is getting chemotherapy she would not need any other immunosuppressive agents for rheumatoid arthritis.  She may be able to come off prednisone.  Her knee joint pain could be managed with pain medications.  The plan of care was discussed as noted above.  Abel Presto  Estanislado Pandy, MD  Note - This record has been created using Editor, commissioning.  Chart creation errors have been sought, but may not always  have been located. Such creation errors do not reflect on  the standard of medical care.

## 2017-11-02 ENCOUNTER — Other Ambulatory Visit: Payer: Self-pay | Admitting: Hematology and Oncology

## 2017-11-02 ENCOUNTER — Telehealth: Payer: Self-pay | Admitting: *Deleted

## 2017-11-02 ENCOUNTER — Telehealth: Payer: Self-pay | Admitting: Hematology and Oncology

## 2017-11-02 ENCOUNTER — Inpatient Hospital Stay (HOSPITAL_BASED_OUTPATIENT_CLINIC_OR_DEPARTMENT_OTHER): Payer: BLUE CROSS/BLUE SHIELD | Admitting: Hematology and Oncology

## 2017-11-02 ENCOUNTER — Inpatient Hospital Stay: Payer: BLUE CROSS/BLUE SHIELD | Attending: Hematology and Oncology

## 2017-11-02 ENCOUNTER — Ambulatory Visit (HOSPITAL_COMMUNITY)
Admission: RE | Admit: 2017-11-02 | Discharge: 2017-11-02 | Disposition: A | Discharge status: Home / Self Care | Payer: BLUE CROSS/BLUE SHIELD | Source: Ambulatory Visit | Attending: Hematology and Oncology | Admitting: Hematology and Oncology

## 2017-11-02 ENCOUNTER — Encounter: Payer: Self-pay | Admitting: Hematology and Oncology

## 2017-11-02 VITALS — BP 128/79 | HR 101 | Temp 98.7°F | Resp 18 | Ht 62.0 in | Wt 229.8 lb

## 2017-11-02 DIAGNOSIS — D709 Neutropenia, unspecified: Secondary | ICD-10-CM | POA: Diagnosis not present

## 2017-11-02 DIAGNOSIS — C541 Malignant neoplasm of endometrium: Secondary | ICD-10-CM | POA: Diagnosis not present

## 2017-11-02 DIAGNOSIS — J328 Other chronic sinusitis: Secondary | ICD-10-CM

## 2017-11-02 DIAGNOSIS — C7961 Secondary malignant neoplasm of right ovary: Secondary | ICD-10-CM | POA: Insufficient documentation

## 2017-11-02 DIAGNOSIS — R300 Dysuria: Secondary | ICD-10-CM | POA: Diagnosis not present

## 2017-11-02 DIAGNOSIS — M1990 Unspecified osteoarthritis, unspecified site: Secondary | ICD-10-CM | POA: Insufficient documentation

## 2017-11-02 DIAGNOSIS — M0609 Rheumatoid arthritis without rheumatoid factor, multiple sites: Secondary | ICD-10-CM | POA: Insufficient documentation

## 2017-11-02 DIAGNOSIS — M79604 Pain in right leg: Secondary | ICD-10-CM | POA: Diagnosis not present

## 2017-11-02 DIAGNOSIS — R5081 Fever presenting with conditions classified elsewhere: Secondary | ICD-10-CM

## 2017-11-02 DIAGNOSIS — Z79899 Other long term (current) drug therapy: Secondary | ICD-10-CM | POA: Diagnosis not present

## 2017-11-02 DIAGNOSIS — C569 Malignant neoplasm of unspecified ovary: Secondary | ICD-10-CM | POA: Diagnosis not present

## 2017-11-02 DIAGNOSIS — C259 Malignant neoplasm of pancreas, unspecified: Secondary | ICD-10-CM | POA: Diagnosis not present

## 2017-11-02 LAB — CMP (CANCER CENTER ONLY)
ALT: 15 U/L (ref 0–44)
AST: 9 U/L — ABNORMAL LOW (ref 15–41)
Albumin: 3.4 g/dL — ABNORMAL LOW (ref 3.5–5.0)
Alkaline Phosphatase: 47 U/L (ref 38–126)
Anion gap: 12 (ref 5–15)
BUN: 10 mg/dL (ref 6–20)
CO2: 26 mmol/L (ref 22–32)
Calcium: 9.5 mg/dL (ref 8.9–10.3)
Chloride: 104 mmol/L (ref 98–111)
Creatinine: 0.66 mg/dL (ref 0.44–1.00)
GFR, Est AFR Am: >60 mL/min (ref >60)
GFR, Estimated: >60 mL/min (ref >60)
Glucose, Bld: 100 mg/dL — ABNORMAL HIGH (ref 70–99)
Potassium: 4.1 mmol/L (ref 3.5–5.1)
Sodium: 142 mmol/L (ref 135–145)
Total Bilirubin: 0.3 mg/dL (ref 0.3–1.2)
Total Protein: 7 g/dL (ref 6.5–8.1)

## 2017-11-02 LAB — CBC WITH DIFFERENTIAL (CANCER CENTER ONLY)
Basophils Absolute: 0 10*3/uL (ref 0.0–0.1)
Basophils Relative: 1 %
Eosinophils Absolute: 0 10*3/uL (ref 0.0–0.5)
Eosinophils Relative: 0 %
HCT: 30 % — ABNORMAL LOW (ref 34.8–46.6)
Hemoglobin: 9.4 g/dL — ABNORMAL LOW (ref 11.6–15.9)
Lymphocytes Relative: 21 %
Lymphs Abs: 0.6 10*3/uL — ABNORMAL LOW (ref 0.9–3.3)
MCH: 27.6 pg (ref 25.1–34.0)
MCHC: 31.5 g/dL (ref 31.5–36.0)
MCV: 87.9 fL (ref 79.5–101.0)
Monocytes Absolute: 0.5 10*3/uL (ref 0.1–0.9)
Monocytes Relative: 16 %
Neutro Abs: 1.8 10*3/uL (ref 1.5–6.5)
Neutrophils Relative %: 62 %
Platelet Count: 182 10*3/uL (ref 145–400)
RBC: 3.41 MIL/uL — ABNORMAL LOW (ref 3.70–5.45)
RDW: 16.1 % — ABNORMAL HIGH (ref 11.2–14.5)
WBC Count: 2.9 10*3/uL — ABNORMAL LOW (ref 3.9–10.3)

## 2017-11-02 LAB — URINALYSIS, COMPLETE (UACMP) WITH MICROSCOPIC
Bilirubin Urine: NEGATIVE
Glucose, UA: NEGATIVE mg/dL
Ketones, ur: NEGATIVE mg/dL
Leukocytes, UA: NEGATIVE
Nitrite: NEGATIVE
Protein, ur: NEGATIVE mg/dL
Specific Gravity, Urine: 1.005 (ref 1.005–1.030)
pH: 6 (ref 5.0–8.0)

## 2017-11-02 MED ORDER — AZITHROMYCIN 500 MG PO TABS: 500.0000 mg | ORAL_TABLET | Freq: Every day | ORAL | 0 refills | Status: DC

## 2017-11-02 NOTE — Assessment & Plan Note (Signed)
She tolerated recent chemotherapy well except for some constipation She is scheduled for CT imaging next week for further follow-up. Continue supportive care.

## 2017-11-02 NOTE — Assessment & Plan Note (Signed)
She has recent dysuria and had just completed a course of antibiotic treatment Urinalysis and cultures are pending As above, I will cover her with antibiotic therapy.  I encouraged her to continue aggressive oral fluid intake as tolerated.

## 2017-11-02 NOTE — Telephone Encounter (Signed)
As noted below by Dr. Alvy Bimler, patient can come this morning to be assessed. She is on her way.

## 2017-11-02 NOTE — Telephone Encounter (Signed)
Can you get her to go to CXR department at [facility] first? I will order STAT CXR Then, I will order labs.

## 2017-11-02 NOTE — Progress Notes (Signed)
[place] OFFICE PROGRESS NOTE  Patient Care Team: [name], MD as PCP - General (Family Medicine)  ASSESSMENT & PLAN:  Secondary malignant neoplasm of right ovary [facility]) She tolerated recent chemotherapy well except for some constipation She is scheduled for CT imaging next week for further follow-up. Continue supportive care.  Sinusitis, chronic She has significant worsening signs and symptoms of chills, low-grade fever, congestion and sinus drainage She is mildly tachycardic She has mild pancytopenia from recent treatment I recommend azithromycin for 3 days for coverage and we will follow-up on cultures.  She is educated to watch out for signs and symptoms of recurrent fever We discussed neutropenic precaution  Rheumatoid arthritis of multiple sites with negative rheumatoid factor ([place]) She has recent significant flare of arthritis I gave her permission to increase the dose of prednisone for a few days for symptom management.  Dysuria She has recent dysuria and had just completed a course of antibiotic treatment Urinalysis and cultures are pending As above, I will cover her with antibiotic therapy.  I encouraged her to continue aggressive oral fluid intake as tolerated.   No orders of the defined types were placed in this encounter.   INTERVAL HISTORY: Please see below for problem oriented charting. She is seen for urgent follow-up due to feeling unwell.  She woke up with chills and temperature of 100.4 She has some sinus congestion, cough and flare of arthritis She also had recent dysuria but denies hematuria or urgency.  No diarrhea.  No recent sick contact. She has just completed recent chemotherapy. Denies worsening neuropathy.  She has some mild constipation.   SUMMARY OF ONCOLOGIC HISTORY: Oncology History   MSI positive  Endometrial :endometrioid Ovarian: Endometrioid      Endometrial cancer ([place])   08/18/2017 Imaging    Ct scan abdomen  and pelvis 1. Mixed attenuation mass emanates from the right adnexa measuring 10.8 x 8.0 cm very suspicious for right ovarian carcinoma. 2. Abnormality of the tail of the pancreas may be due to mild pancreatitis and small pseudocyst formation, but neoplasm cannot be excluded. 3. Small amount of ascites within abdomen and pelvis. 4. Small nonobstructing renal calculi bilaterally.     08/30/2017 Pathology Results    1. Uterus and cervix, with left fallopian tube - ENDOMETRIOID ADENOCARCINOMA, FIGO GRADE I, ARISING IN A BACKGROUND OF DIFFUSE COMPLEX ATYPICAL HYPERPLASIA. - CARCINOMA INVADES FOR OF DEPTH OF 0.2 CM WHERE THICKNESS OF MYOMETRIAL WALL IS 2.1 CM. - ALL RESECTION MARGINS ARE NEGATIVE FOR CARCINOMA. - NEGATIVE FOR LYMPHOVASCULAR OR PERINEURAL INVASION. - CERVICAL STROMA IS NOT INVOLVED. - SEE ONCOLOGY TABLE. - SEE NOTE 2. Ovary and fallopian tube, right - PRIMARY OVARIAN ENDOMETRIOID ADENOCARCINOMA, FIGO GRADE II, 12 CM. - THE OVARIAN SURFACE IS FOCALLY INVOLVED BY CARCINOMA. - NEGATIVE FOR LYMPHOVASCULAR INVASION. - BENIGN UNREMARKABLE FALLOPIAN TUBE, NEGATIVE FOR CARCINOMA. - SEE ONCOLOGY TABLE. - SEE NOTE 3. Cul-de-sac biopsy - METASTATIC ADENOCARCINOMA, MOST CONSISTENT WITH PRIMARY OVARIAN ENDOMETRIOID ADENOCARCINOMA. 4. Ovary, left - BENIGN UNREMARKABLE OVARY, NEGATIVE FOR MALIGNANCY. Microscopic Comment 1. UTERUS, CARCINOMA OR CARCINOSARCOMA Procedure: Total hysterectomy with bilateral salpingo-oophorectomy. Histologic type: Endometrioid adenocarcinoma. Histologic Grade: FIGO Grade I Myometrial invasion: Depth of invasion: 2 mm Myometrial thickness: 21 mm Uterine Serosa Involvement: Not identified Cervical stromal involvement: Not identified Extent of involvement of other organs: Not applicable Lymphovascular invasion: Not identified Regional Lymph Nodes: Examined: 0 Sentinel 0 Non-sentinel 0 Total Tumor block for ancillary studies: 1H MMR / MSI testing:  Pending Pathologic Stage  Classification (pTNM, AJCC 8th edition): pT1a, pNX (v4.1.0.0) 2. OVARY or FALLOPIAN TUBE or PRIMARY PERITONEUM: Procedure: Salpingo-oophorectomy Specimen Integrity: Intact Tumor Site: Right ovary Ovarian Surface Involvement (required only if applicable): Focally involved by carcinoma Fallopian Tube Surface Involvement (required only if applicable): Not identified Tumor Size: 12 cm Histologic Type: Endometrioid adenocarcinoma Histologic Grade: Grade II Implants (required for advanced stage serous/seromucinous borderline tumors only): Not applicable Other Tissue/ Organ Involvement: Cul de sac biopsy involved by tumor Largest Extrapelvic Peritoneal Focus (required only if applicable): Not applicable Peritoneal/Ascitic Fluid: Negative for carcinoma (case # [id number]) Treatment Effect (required only for high-grade serous carcinomas): Not applicable Regional Lymph Nodes: No lymph nodes submitted or found Number of Lymph Nodes Examined: 0 Pathologic Stage Classification (pTNM, AJCC 8th Edition): pT2b, pN0 Representative Tumor Block: 2B and 2E 1. Molecular study for microsatellite instability and immunohistochemical stains for MMR-related proteins are pending and will be reported in an addendum. 2. Immunohistochemical stain show that the ovarian tumor is positive for CK7 and PAX8 (both diffuse), CDX2 (patchy and weak); and negative for CK20. This immunoprofile is consistent with the above diagnosis. Dr. Lyndon Code has reviewed this case and concurs with the above diagnosis. Molecular study for microsatellite instability and immunohistochemical stains for MMR-related proteins are pending and will be reported in an addendum    08/30/2017 Genetic Testing    Patient has genetic testing done for MSI  Results revealed patient has the following mutation(s): loss of [facility] 2    08/30/2017 Surgery    Surgeon: Mart Piggs, MD Pre-operative Diagnosis:  1. Adnexal mass 2. Abnormal  uterine bleeding 3. H/o Cecal CA  Post-operative Diagnosis:  1. Adhesive disease post colon resection 2. Endometrial cancer NOS 3. Adenocarcinoma unknown origin, right ovary, suspicious for GI primary  Operation:  1. Lysis of adhesions ~30 minutes 2. Robotic-assisted laparoscopic total hysterectomy with right salpingo-oophorectomy and left salpingectomy 3. Left oophorectomy (RA-laparoscopic) 4. Pelvic washings  Findings: Adhesions of omentum to anterior abdominal wall. Enlarged cystic right ovary ~10cm. Uterus had small nodules on serosa near where right adnexa was intimate with the surface. No obvious intraoperative rupture of cyst, although in 2 areas the wall was thin and one of these areas had some bleeding. Slight scarring of left bladder dome to LUS/cervix. Uterus on frozen section c/w hyperplasia and a small focus of endometrial CA - no myo invasion, <2cm in size. Frozen section on the right adnexa was carcinoma, met from colon or possibly Gyn, favor GI primary, defer to permanent. Left ovary was WNL.      09/15/2017 Cancer Staging    Staging form: Corpus Uteri - Carcinoma and Carcinosarcoma, AJCC 8th Edition - Pathologic: FIGO Stage IA (pT1a, pN0, cM0) - Signed by Heath Lark, MD on 09/15/2017    09/26/2017 Procedure    Successful placement of a right internal jugular approach power injectable Port-A-Cath. The catheter is ready for immediate use.     Secondary malignant neoplasm of right ovary ([place])   08/23/2017 Tumor Marker    Patient's tumor was tested for the following markers: CA-125 Results of the tumor marker test revealed 139.8    08/31/2017 Initial Diagnosis    Secondary malignant neoplasm of right ovary ([place])    09/15/2017 Cancer Staging    Staging form: Ovary, Fallopian Tube, and Primary Peritoneal Carcinoma, AJCC 8th Edition - Pathologic: Stage IIB (pT2b, pN0, cM0) - Signed by Heath Lark, MD on 09/15/2017    09/27/2017 Imaging    No evidence of metastatic disease  or other acute findings within the thorax.  4 cm low-attenuation mass in pancreatic tail, highly suspicious for pancreatic carcinoma. This is caused splenic vein thrombosis, with new venous collaterals in the left upper quadrant. Consider endoscopic ultrasound with FNA for tissue diagnosis.  Stable benign hepatic hemangioma.     09/27/2017 Tumor Marker    Patient's tumor was tested for the following markers: CA-125 Results of the tumor marker test revealed 39.4     Pancreatic cancer ([place])   10/13/2017 Pathology Results    Pancreas tail mass, endoscopic ultrasound-guided, fine needle aspiration II (smears and cell block): Adenocarcinoma     REVIEW OF SYSTEMS:   Eyes: Denies blurriness of vision Ears, nose, mouth, throat, and face: Denies mucositis or sore throat Cardiovascular: Denies palpitation, chest discomfort or lower extremity swelling Gastrointestinal:  Denies nausea, heartburn or change in bowel habits Skin: Denies abnormal skin rashes Lymphatics: Denies new lymphadenopathy or easy bruising Neurological:Denies numbness, tingling or new weaknesses Behavioral/Psych: Mood is stable, no new changes  All other systems were reviewed with the patient and are negative.  I have reviewed the past medical history, past surgical history, social history and family history with the patient and they are unchanged from previous note.  ALLERGIES:  is allergic to codeine; penicillins; and bactrim [sulfamethoxazole-trimethoprim].  MEDICATIONS:  Current Outpatient Medications  Medication Sig Dispense Refill  . acetaminophen (TYLENOL) 650 MG CR tablet Take 1,300 mg by mouth every 8 (eight) hours.    Marland Kitchen azithromycin (ZITHROMAX) 500 MG tablet Take 1 tablet (500 mg total) by mouth daily. 3 tablet 0  . dexamethasone (DECADRON) 4 MG tablet Take 5 tabs at the night before and 5 tab the morning of chemotherapy, every 3 weeks, by mouth (Patient taking differently: Take 20 mg by mouth See admin  instructions. Take 5 tabs at the night before and 5 tab the morning of chemotherapy, every 3 weeks, by mouth) 60 tablet 0  . diclofenac sodium (VOLTAREN) 1 % GEL APPLY 3 GM TO AFFECTED 3 LARGE JOINTS UP TO 3 TIMES A DAY AS NEEDED AS DIRECTED (Patient taking differently: Apply 2 g topically 3 (three) times daily as needed (pain). ) 300 g 1  . fluticasone (FLONASE) 50 MCG/ACT nasal spray Place 2 sprays into both nostrils daily.    Marland Kitchen ibuprofen (ADVIL,MOTRIN) 600 MG tablet Take 1 tablet (600 mg total) by mouth every 6 (six) hours. 30 tablet 0  . lidocaine-prilocaine (EMLA) cream Apply to affected area once (Patient taking differently: Apply 1 application topically daily as needed (port). Apply to affected area once) 30 g 3  . loratadine (CLARITIN) 10 MG tablet Take 10 mg by mouth daily after breakfast.    . omeprazole (PRILOSEC) 20 MG capsule Take 20 mg by mouth 2 (two) times daily before a meal.     . ondansetron (ZOFRAN) 8 MG tablet Take 1 tablet (8 mg total) by mouth every 8 (eight) hours as needed for refractory nausea / vomiting. Start on day 3 after chemo. 30 tablet 1  . predniSONE (DELTASONE) 10 MG tablet TAKE 1 TABLET BY MOUTH EVERY DAY WITH BREAKFAST 30 tablet 0  . prochlorperazine (COMPAZINE) 10 MG tablet Take 1 tablet (10 mg total) by mouth every 6 (six) hours as needed (Nausea or vomiting). 30 tablet 1  . simethicone (MYLICON) 701 MG chewable tablet Chew 125 mg by mouth every 6 (six) hours as needed for flatulence.    . traMADol (ULTRAM) 50 MG tablet Take 1 tablet (50 mg total) by  mouth every 6 (six) hours as needed. 30 tablet 0   No current facility-administered medications for this visit.     PHYSICAL EXAMINATION: ECOG PERFORMANCE STATUS: 1 - Symptomatic but completely ambulatory  Vitals:   11/02/17 1118  BP: 128/79  Pulse: (!) 101  Resp: 18  Temp: 98.7 F (37.1 C)  SpO2: 99%   Filed Weights   11/02/17 1118  Weight: 229 lb 12.8 oz (104.2 kg)    GENERAL:alert, no distress  and comfortable.  She looks a little ill. SKIN: skin color, texture, turgor are normal, no rashes or significant lesions EYES: normal, Conjunctiva are pink and non-injected, sclera clear OROPHARYNX:no exudate, no erythema and lips, buccal mucosa, and tongue normal.  She has mild tenderness on palpation over her sinuses. NECK: supple, thyroid normal size, non-tender, without nodularity LYMPH:  no palpable lymphadenopathy in the cervical, axillary or inguinal LUNGS: clear to auscultation and percussion with normal breathing effort HEART: regular rate & rhythm and no murmurs and no lower extremity edema ABDOMEN:abdomen soft, non-tender and normal bowel sounds Musculoskeletal:no cyanosis of digits and no clubbing  NEURO: alert & oriented x 3 with fluent speech, no focal motor/sensory deficits  LABORATORY DATA:  I have reviewed the data as listed    Component Value Date/Time   NA 142 11/02/2017 1057   K 4.1 11/02/2017 1057   CL 104 11/02/2017 1057   CO2 26 11/02/2017 1057   GLUCOSE 100 (H) 11/02/2017 1057   BUN 10 11/02/2017 1057   CREATININE 0.66 11/02/2017 1057   CREATININE 0.68 07/27/2017 0851   CALCIUM 9.5 11/02/2017 1057   PROT 7.0 11/02/2017 1057   ALBUMIN 3.4 (L) 11/02/2017 1057   AST 9 (L) 11/02/2017 1057   ALT 15 11/02/2017 1057   ALKPHOS 47 11/02/2017 1057   BILITOT 0.3 11/02/2017 1057   GFRNONAA >60 11/02/2017 1057   GFRNONAA 106 07/27/2017 0851   GFRAA >60 11/02/2017 1057   GFRAA 123 07/27/2017 0851    No results found for: SPEP, UPEP  Lab Results  Component Value Date   WBC 2.9 (L) 11/02/2017   NEUTROABS 1.8 11/02/2017   HGB 9.4 (L) 11/02/2017   HCT 30.0 (L) 11/02/2017   MCV 87.9 11/02/2017   PLT 182 11/02/2017      Chemistry      Component Value Date/Time   NA 142 11/02/2017 1057   K 4.1 11/02/2017 1057   CL 104 11/02/2017 1057   CO2 26 11/02/2017 1057   BUN 10 11/02/2017 1057   CREATININE 0.66 11/02/2017 1057   CREATININE 0.68 07/27/2017 0851       Component Value Date/Time   CALCIUM 9.5 11/02/2017 1057   ALKPHOS 47 11/02/2017 1057   AST 9 (L) 11/02/2017 1057   ALT 15 11/02/2017 1057   BILITOT 0.3 11/02/2017 1057       RADIOGRAPHIC STUDIES: I have personally reviewed the radiological images as listed and agreed with the findings in the report. Dg Chest 2 View  Result Date: 11/02/2017 CLINICAL DATA:  Ovarian carcinoma.  Fever following chemotherapy EXAM: CHEST - 2 VIEW COMPARISON:  Chest radiograph October 08, 2017 and chest CT October 09, 2017 FINDINGS: Port-A-Cath tip is in the superior vena cava near the cavoatrial junction. No pneumothorax. Lungs are clear. Heart size and pulmonary vascularity are normal. No adenopathy. No bone lesions. IMPRESSION: No edema or consolidation.  No evident adenopathy. Electronically Signed   By: Lowella Grip III M.D.   On: 11/02/2017 10:50   Dg Chest 2  View  Result Date: 10/09/2017 CLINICAL DATA:  Initial evaluation for acute chest pain. EXAM: CHEST - 2 VIEW COMPARISON:  Prior radiograph from 02/20/2007. FINDINGS: Right-sided Port-A-Cath in place with tip overlying the cavoatrial junction. Cardiac and mediastinal silhouettes within normal limits. Lungs normally inflated. No focal infiltrates. No pulmonary edema or pleural effusion. No pneumothorax. No acute osseous abnormality. IMPRESSION: No active cardiopulmonary disease. Electronically Signed   By: Jeannine Boga M.D.   On: 10/09/2017 00:11   Ct Angio Chest Pe W Or Wo Contrast  Result Date: 10/09/2017 CLINICAL DATA:  Chest pain. Chemotherapy for endometrial cancer. Left-sided chest pain for 3 days. EXAM: CT ANGIOGRAPHY CHEST WITH CONTRAST TECHNIQUE: Multidetector CT imaging of the chest was performed using the standard protocol during bolus administration of intravenous contrast. Multiplanar CT image reconstructions and MIPs were obtained to evaluate the vascular anatomy. CONTRAST:  114m ISOVUE-370 IOPAMIDOL (ISOVUE-370) INJECTION 76%  COMPARISON:  09/27/2017 FINDINGS: Cardiovascular: Good opacification of the central and segmental pulmonary arteries. No focal filling defects. No evidence of significant pulmonary embolus. Normal heart size. No pericardial effusion. Normal caliber thoracic aorta. No evidence of dissection. Great vessel origins are patent. Mediastinum/Nodes: Esophagus is decompressed. No significant lymphadenopathy in the chest. Thyroid gland is unremarkable. Lungs/Pleura: Mild dependent changes in the lungs. Lungs are otherwise clear and expanded. No pleural effusions. No pneumothorax. Airways are patent. Upper Abdomen: Focal fullness at the junction of the body and tail of the pancreas with surrounding infiltration. This is suspicious for pancreatic neoplasm, measuring about 2.7 cm diameter. Similar changes present on previous study. Tissue sampling was recommended at that study. Possible small lymph nodes in the celiac axis. Musculoskeletal: No destructive bone lesions. Review of the MIP images confirms the above findings. IMPRESSION: 1. No evidence of significant pulmonary embolus. 2. No evidence of active pulmonary disease. 3. Focal mass at the junction of the body and tail of the pancreas with surrounding infiltration. This is suspicious for pancreatic neoplasm. Tissue sampling was recommended on previous study. Electronically Signed   By: WLucienne CapersM.D.   On: 10/09/2017 03:09    All questions were answered. The patient knows to call the clinic with any problems, questions or concerns. No barriers to learning was detected.  I spent 20 minutes counseling the patient face to face. The total time spent in the appointment was 25 minutes and more than 50% was on counseling and review of test results  NHeath Lark MD 11/02/2017 12:11 PM

## 2017-11-02 NOTE — Assessment & Plan Note (Signed)
She has significant worsening signs and symptoms of chills, low-grade fever, congestion and sinus drainage She is mildly tachycardic She has mild pancytopenia from recent treatment I recommend azithromycin for 3 days for coverage and we will follow-up on cultures.  She is educated to watch out for signs and symptoms of recurrent fever We discussed neutropenic precaution

## 2017-11-02 NOTE — Telephone Encounter (Signed)
Can she come in today for evaluation? She is at the neutropenic phase right now. I prefer to order CXR, blood Cx and Ucx just to be sure. If agreeable I can see her this morning

## 2017-11-02 NOTE — Telephone Encounter (Signed)
Spoke with patient regarding symptoms of a UTI. Patient stated,"at the beginning of the week I had increased urination. I'm drinking 64 ounces of water/day. I do note have any pain while urinating, the urine is clear and doesn't have an odor. I woke up this morning with a fever of 100.4 and lower back pain. Those are my only symptoms. I think this is a UTI coming back because after my first round of chemo, I did the same thing. I just completed my second round of chemotherapy. [facility] uses [facility] on [address]. Instructed her I would give this message to Dr. Alvy Bimler. Return number is [phone].

## 2017-11-02 NOTE — Telephone Encounter (Signed)
Added lab/NG appt for today per NG/Stacey, R.N.

## 2017-11-02 NOTE — Assessment & Plan Note (Signed)
She has recent significant flare of arthritis I gave her permission to increase the dose of prednisone for a few days for symptom management.

## 2017-11-03 ENCOUNTER — Other Ambulatory Visit: Payer: Self-pay | Admitting: *Deleted

## 2017-11-03 ENCOUNTER — Encounter: Payer: Self-pay | Admitting: Hematology and Oncology

## 2017-11-03 ENCOUNTER — Encounter: Payer: Self-pay | Admitting: *Deleted

## 2017-11-03 ENCOUNTER — Encounter: Payer: Self-pay | Admitting: Rheumatology

## 2017-11-03 DIAGNOSIS — Z85038 Personal history of other malignant neoplasm of large intestine: Secondary | ICD-10-CM

## 2017-11-03 DIAGNOSIS — C541 Malignant neoplasm of endometrium: Secondary | ICD-10-CM

## 2017-11-03 DIAGNOSIS — C259 Malignant neoplasm of pancreas, unspecified: Secondary | ICD-10-CM

## 2017-11-03 LAB — URINE CULTURE

## 2017-11-03 LAB — CA 125: Cancer Antigen (CA) 125: 19 U/mL (ref 0.0–38.1)

## 2017-11-03 MED ORDER — PREDNISONE 10 MG PO TABS: 20.0000 mg | ORAL_TABLET | Freq: Every day | ORAL | 1 refills | Status: DC

## 2017-11-03 NOTE — Telephone Encounter (Signed)
Okay to give her prednisone 20 mg p.o. daily and then taper by 5 mg when ready to decrease it.  As there are side effects of the high-dose prednisone.

## 2017-11-04 ENCOUNTER — Other Ambulatory Visit: Payer: Self-pay | Admitting: *Deleted

## 2017-11-04 ENCOUNTER — Telehealth: Payer: Self-pay | Admitting: *Deleted

## 2017-11-04 DIAGNOSIS — C541 Malignant neoplasm of endometrium: Secondary | ICD-10-CM

## 2017-11-04 DIAGNOSIS — R300 Dysuria: Secondary | ICD-10-CM

## 2017-11-04 DIAGNOSIS — C259 Malignant neoplasm of pancreas, unspecified: Secondary | ICD-10-CM

## 2017-11-04 NOTE — Telephone Encounter (Signed)
-----   Message from Heath Lark, MD sent at 11/04/2017  7:35 AM EDT ----- Regarding: how is she? Let her know blood culture is negative Urine showed some infection Is she better? ----- Message ----- From: Interface, Lab In [place] Sent: 11/02/2017  11:20 AM EDT To: Heath Lark, MD

## 2017-11-04 NOTE — Telephone Encounter (Signed)
As noted below by Dr. Alvy Bimler, I informed the patient that she doesn't need Macrobid. The Azithromycin should be enough. We will repeat the urinalysis and urine culture on Monday, 9/16. She verbalized understanding.

## 2017-11-04 NOTE — Telephone Encounter (Signed)
Just a little update on Dana Hicks.

## 2017-11-04 NOTE — Telephone Encounter (Signed)
No need macrobid The azithromycin might get rid of it Will see if anything show up on CT next week Please add repeat urinalysis and urine culture on Monday

## 2017-11-04 NOTE — Telephone Encounter (Signed)
Per Dr. Alvy Bimler, I called and spoke to patient regarding UTI. I informed patient that her blood culture was negative, but her urine showed some infection. She stated,"I don't have a fever anymore, my sinuses are better, no pain on urination; however, I still have decreased back pain. Is she going to put me on Macrobid?" I told her I would give this message to Dr. Alvy Bimler and call her back. [facility] [address].

## 2017-11-05 ENCOUNTER — Other Ambulatory Visit: Payer: Self-pay | Admitting: Hematology and Oncology

## 2017-11-07 ENCOUNTER — Other Ambulatory Visit: Payer: Self-pay | Admitting: Rheumatology

## 2017-11-07 ENCOUNTER — Ambulatory Visit (HOSPITAL_COMMUNITY)
Admission: RE | Admit: 2017-11-07 | Discharge: 2017-11-07 | Disposition: A | Discharge status: Home / Self Care | Payer: BLUE CROSS/BLUE SHIELD | Source: Ambulatory Visit | Attending: Hematology and Oncology | Admitting: Hematology and Oncology

## 2017-11-07 ENCOUNTER — Inpatient Hospital Stay: Payer: BLUE CROSS/BLUE SHIELD

## 2017-11-07 DIAGNOSIS — C7961 Secondary malignant neoplasm of right ovary: Secondary | ICD-10-CM

## 2017-11-07 DIAGNOSIS — Z79899 Other long term (current) drug therapy: Secondary | ICD-10-CM | POA: Diagnosis not present

## 2017-11-07 DIAGNOSIS — M79604 Pain in right leg: Secondary | ICD-10-CM | POA: Diagnosis not present

## 2017-11-07 DIAGNOSIS — I700 Atherosclerosis of aorta: Secondary | ICD-10-CM | POA: Diagnosis not present

## 2017-11-07 DIAGNOSIS — R300 Dysuria: Secondary | ICD-10-CM

## 2017-11-07 DIAGNOSIS — K8689 Other specified diseases of pancreas: Secondary | ICD-10-CM | POA: Insufficient documentation

## 2017-11-07 DIAGNOSIS — M1990 Unspecified osteoarthritis, unspecified site: Secondary | ICD-10-CM | POA: Diagnosis not present

## 2017-11-07 DIAGNOSIS — C259 Malignant neoplasm of pancreas, unspecified: Secondary | ICD-10-CM

## 2017-11-07 DIAGNOSIS — D490 Neoplasm of unspecified behavior of digestive system: Secondary | ICD-10-CM | POA: Diagnosis not present

## 2017-11-07 DIAGNOSIS — C541 Malignant neoplasm of endometrium: Secondary | ICD-10-CM

## 2017-11-07 DIAGNOSIS — N200 Calculus of kidney: Secondary | ICD-10-CM | POA: Diagnosis not present

## 2017-11-07 DIAGNOSIS — M0609 Rheumatoid arthritis without rheumatoid factor, multiple sites: Secondary | ICD-10-CM | POA: Diagnosis not present

## 2017-11-07 DIAGNOSIS — Z85038 Personal history of other malignant neoplasm of large intestine: Secondary | ICD-10-CM

## 2017-11-07 LAB — CBC WITH DIFFERENTIAL/PLATELET
Basophils Absolute: 0 10*3/uL (ref 0.0–0.1)
Basophils Relative: 0 %
Eosinophils Absolute: 0 10*3/uL (ref 0.0–0.5)
Eosinophils Relative: 0 %
HCT: 32.9 % — ABNORMAL LOW (ref 34.8–46.6)
Hemoglobin: 10 g/dL — ABNORMAL LOW (ref 11.6–15.9)
Lymphocytes Relative: 44 %
Lymphs Abs: 2.4 10*3/uL (ref 0.9–3.3)
MCH: 27.9 pg (ref 25.1–34.0)
MCHC: 30.4 g/dL — ABNORMAL LOW (ref 31.5–36.0)
MCV: 91.9 fL (ref 79.5–101.0)
Monocytes Absolute: 0.6 10*3/uL (ref 0.1–0.9)
Monocytes Relative: 12 %
Neutro Abs: 2.3 10*3/uL (ref 1.5–6.5)
Neutrophils Relative %: 44 %
Platelets: 214 10*3/uL (ref 145–400)
RBC: 3.58 MIL/uL — ABNORMAL LOW (ref 3.70–5.45)
RDW: 16.3 % — ABNORMAL HIGH (ref 11.2–14.5)
WBC: 5.3 10*3/uL (ref 3.9–10.3)

## 2017-11-07 LAB — CEA (IN HOUSE-CHCC): CEA (CHCC-In House): 1.11 ng/mL (ref 0.00–5.00)

## 2017-11-07 LAB — COMPREHENSIVE METABOLIC PANEL
ALT: 15 U/L (ref 0–44)
AST: 11 U/L — ABNORMAL LOW (ref 15–41)
Albumin: 3.4 g/dL — ABNORMAL LOW (ref 3.5–5.0)
Alkaline Phosphatase: 47 U/L (ref 38–126)
Anion gap: 12 (ref 5–15)
BUN: 16 mg/dL (ref 6–20)
CO2: 26 mmol/L (ref 22–32)
Calcium: 9.6 mg/dL (ref 8.9–10.3)
Chloride: 105 mmol/L (ref 98–111)
Creatinine, Ser: 0.68 mg/dL (ref 0.44–1.00)
GFR calc Af Amer: >60 mL/min (ref >60)
GFR calc non Af Amer: >60 mL/min (ref >60)
Glucose, Bld: 88 mg/dL (ref 70–99)
Potassium: 3.7 mmol/L (ref 3.5–5.1)
Sodium: 143 mmol/L (ref 135–145)
Total Bilirubin: 0.2 mg/dL — ABNORMAL LOW (ref 0.3–1.2)
Total Protein: 7 g/dL (ref 6.5–8.1)

## 2017-11-07 LAB — URINALYSIS, COMPLETE (UACMP) WITH MICROSCOPIC
Bilirubin Urine: NEGATIVE
Glucose, UA: NEGATIVE mg/dL
Ketones, ur: NEGATIVE mg/dL
Leukocytes, UA: NEGATIVE
Nitrite: NEGATIVE
Protein, ur: NEGATIVE mg/dL
Specific Gravity, Urine: 1.041 — ABNORMAL HIGH (ref 1.005–1.030)
pH: 6 (ref 5.0–8.0)

## 2017-11-07 LAB — CULTURE, BLOOD (SINGLE): Culture: NO GROWTH

## 2017-11-07 LAB — APTT: aPTT: 28 seconds (ref 24–36)

## 2017-11-07 LAB — PROTIME-INR
INR: 0.96
Prothrombin Time: 12.7 seconds (ref 11.4–15.2)

## 2017-11-07 MED ORDER — SODIUM CHLORIDE 0.9% FLUSH
10.0000 mL | Freq: Once | INTRAVENOUS | Status: AC
  Administered 2017-11-07: 10 mL
  Filled 2017-11-07: qty 10

## 2017-11-07 MED ORDER — HEPARIN SOD (PORK) LOCK FLUSH 100 UNIT/ML IV SOLN
500.0000 [IU] | Freq: Once | INTRAVENOUS | Status: AC
  Administered 2017-11-07: 500 [IU] via INTRAVENOUS

## 2017-11-07 MED ORDER — IOHEXOL 300 MG/ML  SOLN
100.0000 mL | Freq: Once | INTRAMUSCULAR | Status: AC | PRN
  Administered 2017-11-07: 100 mL via INTRAVENOUS

## 2017-11-07 MED ORDER — HEPARIN SOD (PORK) LOCK FLUSH 100 UNIT/ML IV SOLN
INTRAVENOUS | Status: AC
  Filled 2017-11-07: qty 5

## 2017-11-07 MED ORDER — IOPAMIDOL (ISOVUE-300) INJECTION 61%
INTRAVENOUS | Status: AC
  Filled 2017-11-07: qty 30

## 2017-11-07 MED ORDER — IOPAMIDOL (ISOVUE-300) INJECTION 61%
30.0000 mL | Freq: Once | INTRAVENOUS | Status: DC | PRN
  Administered 2017-11-07: 30 mL via ORAL
  Filled 2017-11-07: qty 30

## 2017-11-08 ENCOUNTER — Encounter: Payer: Self-pay | Admitting: Hematology and Oncology

## 2017-11-08 ENCOUNTER — Ambulatory Visit (HOSPITAL_COMMUNITY): Payer: BLUE CROSS/BLUE SHIELD

## 2017-11-08 ENCOUNTER — Inpatient Hospital Stay (HOSPITAL_BASED_OUTPATIENT_CLINIC_OR_DEPARTMENT_OTHER): Payer: BLUE CROSS/BLUE SHIELD | Admitting: Hematology and Oncology

## 2017-11-08 ENCOUNTER — Telehealth: Payer: Self-pay | Admitting: Hematology and Oncology

## 2017-11-08 ENCOUNTER — Other Ambulatory Visit: Payer: Self-pay | Admitting: Hematology and Oncology

## 2017-11-08 DIAGNOSIS — R300 Dysuria: Secondary | ICD-10-CM

## 2017-11-08 DIAGNOSIS — C541 Malignant neoplasm of endometrium: Secondary | ICD-10-CM | POA: Diagnosis not present

## 2017-11-08 DIAGNOSIS — C259 Malignant neoplasm of pancreas, unspecified: Secondary | ICD-10-CM | POA: Diagnosis not present

## 2017-11-08 DIAGNOSIS — M79604 Pain in right leg: Secondary | ICD-10-CM | POA: Diagnosis not present

## 2017-11-08 DIAGNOSIS — M1990 Unspecified osteoarthritis, unspecified site: Secondary | ICD-10-CM

## 2017-11-08 DIAGNOSIS — M0609 Rheumatoid arthritis without rheumatoid factor, multiple sites: Secondary | ICD-10-CM | POA: Diagnosis not present

## 2017-11-08 DIAGNOSIS — Z79899 Other long term (current) drug therapy: Secondary | ICD-10-CM | POA: Diagnosis not present

## 2017-11-08 DIAGNOSIS — C7961 Secondary malignant neoplasm of right ovary: Secondary | ICD-10-CM | POA: Diagnosis not present

## 2017-11-08 LAB — URINE CULTURE

## 2017-11-08 LAB — HEMOGLOBIN A1C
Hgb A1c MFr Bld: 6 % — ABNORMAL HIGH (ref 4.8–5.6)
Mean Plasma Glucose: 126 mg/dL

## 2017-11-08 LAB — CANCER ANTIGEN 19-9: CA 19-9: 9 U/mL (ref 0–35)

## 2017-11-08 LAB — CA 125: Cancer Antigen (CA) 125: 18.3 U/mL (ref 0.0–38.1)

## 2017-11-08 MED ORDER — HYDROMORPHONE HCL 4 MG PO TABS: 4.0000 mg | ORAL_TABLET | ORAL | 0 refills | Status: DC | PRN

## 2017-11-08 MED ORDER — CIPROFLOXACIN HCL 250 MG PO TABS: 250.0000 mg | ORAL_TABLET | Freq: Two times a day (BID) | ORAL | 0 refills | Status: DC

## 2017-11-08 NOTE — Assessment & Plan Note (Signed)
I have reviewed multiple imaging studies with the patient CT imaging show regression of the size of the pancreatic cancer Tumor marker is within normal limits I felt that her cancer is still considered resectable She has made copies of CT imaging for her surgeon to review I will call her surgeon for an update If her surgeon is considering splenectomy, she would need splenectomy vaccination prior to surgery

## 2017-11-08 NOTE — Assessment & Plan Note (Signed)
She has symptoms of dysuria Urinalysis from yesterday was inconclusive Urine culture is pending We will call her with test results and if she still have persistent UTI, I will prescribe antibiotic therapy.

## 2017-11-08 NOTE — Assessment & Plan Note (Signed)
She has severe joint pain, currently well controlled with 20 mg of prednisone I felt that it is better for me to switch out her prednisone and substitute with narcotic prescription in anticipation for future surgery I recommend she reduce prednisone to 10 mg and initiate slow taper over the next few days We discussed the risk, benefits, side effects of Dilaudid and she agreed to try I warned her about risk of nausea, constipation and sedation.

## 2017-11-08 NOTE — Telephone Encounter (Signed)
I reviewed her urine culture She is symptomatic I recommend 3 days cipro I spoke with her surgeon and updated him

## 2017-11-08 NOTE — Progress Notes (Signed)
[place] OFFICE PROGRESS NOTE  Patient Care Team: [name], MD as PCP - General (Family Medicine)  ASSESSMENT & PLAN:  Secondary malignant neoplasm of right ovary [facility]) I have reviewed multiple imaging studies with the patient Her tumor marker is within normal limits There are no signs of cancer recurrence If her surgeon agreed to perform surgery for pancreatic cancer, we will put her adjuvant chemotherapy on hold She will call me after her meeting with her surgeon before I counseled her chemotherapy that was scheduled for Friday.  Pancreatic cancer ([place]) I have reviewed multiple imaging studies with the patient CT imaging show regression of the size of the pancreatic cancer Tumor marker is within normal limits I felt that her cancer is still considered resectable She has made copies of CT imaging for her surgeon to review I will call her surgeon for an update If her surgeon is considering splenectomy, she would need splenectomy vaccination prior to surgery  Rheumatoid arthritis of multiple sites with negative rheumatoid factor ([place]) She has severe joint pain, currently well controlled with 20 mg of prednisone I felt that it is better for me to switch out her prednisone and substitute with narcotic prescription in anticipation for future surgery I recommend she reduce prednisone to 10 mg and initiate slow taper over the next few days We discussed the risk, benefits, side effects of Dilaudid and she agreed to try I warned her about risk of nausea, constipation and sedation.  Dysuria She has symptoms of dysuria Urinalysis from yesterday was inconclusive Urine culture is pending We will call her with test results and if she still have persistent UTI, I will prescribe antibiotic therapy.   No orders of the defined types were placed in this encounter.   INTERVAL HISTORY: Please see below for problem oriented charting. She returns today to review imaging  study and test results Since last time I saw her, her nasal congestion/sinusitis is improving although she still have mild nasal drainage She continues to have mild suprapubic discomfort She denies significant peripheral neuropathy from prior treatment No nausea, vomiting or severe constipation She is currently taking 20 mg of prednisone daily to control her joint pain She continues to have lower back pain but it is stable  SUMMARY OF ONCOLOGIC HISTORY: Oncology History   MSI positive  Endometrial :endometrioid Ovarian: Endometrioid      Endometrial cancer ([place])   08/18/2017 Imaging    Ct scan abdomen and pelvis 1. Mixed attenuation mass emanates from the right adnexa measuring 10.8 x 8.0 cm very suspicious for right ovarian carcinoma. 2. Abnormality of the tail of the pancreas may be due to mild pancreatitis and small pseudocyst formation, but neoplasm cannot be excluded. 3. Small amount of ascites within abdomen and pelvis. 4. Small nonobstructing renal calculi bilaterally.     08/30/2017 Pathology Results    1. Uterus and cervix, with left fallopian tube - ENDOMETRIOID ADENOCARCINOMA, FIGO GRADE I, ARISING IN A BACKGROUND OF DIFFUSE COMPLEX ATYPICAL HYPERPLASIA. - CARCINOMA INVADES FOR OF DEPTH OF 0.2 CM WHERE THICKNESS OF MYOMETRIAL WALL IS 2.1 CM. - ALL RESECTION MARGINS ARE NEGATIVE FOR CARCINOMA. - NEGATIVE FOR LYMPHOVASCULAR OR PERINEURAL INVASION. - CERVICAL STROMA IS NOT INVOLVED. - SEE ONCOLOGY TABLE. - SEE NOTE 2. Ovary and fallopian tube, right - PRIMARY OVARIAN ENDOMETRIOID ADENOCARCINOMA, FIGO GRADE II, 12 CM. - THE OVARIAN SURFACE IS FOCALLY INVOLVED BY CARCINOMA. - NEGATIVE FOR LYMPHOVASCULAR INVASION. - BENIGN UNREMARKABLE FALLOPIAN TUBE, NEGATIVE FOR CARCINOMA. - SEE  ONCOLOGY TABLE. - SEE NOTE 3. Cul-de-sac biopsy - METASTATIC ADENOCARCINOMA, MOST CONSISTENT WITH PRIMARY OVARIAN ENDOMETRIOID ADENOCARCINOMA. 4. Ovary, left - BENIGN UNREMARKABLE OVARY,  NEGATIVE FOR MALIGNANCY. Microscopic Comment 1. UTERUS, CARCINOMA OR CARCINOSARCOMA Procedure: Total hysterectomy with bilateral salpingo-oophorectomy. Histologic type: Endometrioid adenocarcinoma. Histologic Grade: FIGO Grade I Myometrial invasion: Depth of invasion: 2 mm Myometrial thickness: 21 mm Uterine Serosa Involvement: Not identified Cervical stromal involvement: Not identified Extent of involvement of other organs: Not applicable Lymphovascular invasion: Not identified Regional Lymph Nodes: Examined: 0 Sentinel 0 Non-sentinel 0 Total Tumor block for ancillary studies: 1H MMR / MSI testing: Pending Pathologic Stage Classification (pTNM, AJCC 8th edition): pT1a, pNX (v4.1.0.0) 2. OVARY or FALLOPIAN TUBE or PRIMARY PERITONEUM: Procedure: Salpingo-oophorectomy Specimen Integrity: Intact Tumor Site: Right ovary Ovarian Surface Involvement (required only if applicable): Focally involved by carcinoma Fallopian Tube Surface Involvement (required only if applicable): Not identified Tumor Size: 12 cm Histologic Type: Endometrioid adenocarcinoma Histologic Grade: Grade II Implants (required for advanced stage serous/seromucinous borderline tumors only): Not applicable Other Tissue/ Organ Involvement: Cul de sac biopsy involved by tumor Largest Extrapelvic Peritoneal Focus (required only if applicable): Not applicable Peritoneal/Ascitic Fluid: Negative for carcinoma (case # [id number]) Treatment Effect (required only for high-grade serous carcinomas): Not applicable Regional Lymph Nodes: No lymph nodes submitted or found Number of Lymph Nodes Examined: 0 Pathologic Stage Classification (pTNM, AJCC 8th Edition): pT2b, pN0 Representative Tumor Block: 2B and 2E 1. Molecular study for microsatellite instability and immunohistochemical stains for MMR-related proteins are pending and will be reported in an addendum. 2. Immunohistochemical stain show that the ovarian tumor is positive  for CK7 and PAX8 (both diffuse), CDX2 (patchy and weak); and negative for CK20. This immunoprofile is consistent with the above diagnosis. Dr. Lyndon Code has reviewed this case and concurs with the above diagnosis. Molecular study for microsatellite instability and immunohistochemical stains for MMR-related proteins are pending and will be reported in an addendum    08/30/2017 Genetic Testing    Patient has genetic testing done for MSI  Results revealed patient has the following mutation(s): loss of [facility] 2    08/30/2017 Surgery    Surgeon: Mart Piggs, MD Pre-operative Diagnosis:  1. Adnexal mass 2. Abnormal uterine bleeding 3. H/o Cecal CA  Post-operative Diagnosis:  1. Adhesive disease post colon resection 2. Endometrial cancer NOS 3. Adenocarcinoma unknown origin, right ovary, suspicious for GI primary  Operation:  1. Lysis of adhesions ~30 minutes 2. Robotic-assisted laparoscopic total hysterectomy with right salpingo-oophorectomy and left salpingectomy 3. Left oophorectomy (RA-laparoscopic) 4. Pelvic washings  Findings: Adhesions of omentum to anterior abdominal wall. Enlarged cystic right ovary ~10cm. Uterus had small nodules on serosa near where right adnexa was intimate with the surface. No obvious intraoperative rupture of cyst, although in 2 areas the wall was thin and one of these areas had some bleeding. Slight scarring of left bladder dome to LUS/cervix. Uterus on frozen section c/w hyperplasia and a small focus of endometrial CA - no myo invasion, <2cm in size. Frozen section on the right adnexa was carcinoma, met from colon or possibly Gyn, favor GI primary, defer to permanent. Left ovary was WNL.      09/15/2017 Cancer Staging    Staging form: Corpus Uteri - Carcinoma and Carcinosarcoma, AJCC 8th Edition - Pathologic: FIGO Stage IA (pT1a, pN0, cM0) - Signed by Heath Lark, MD on 09/15/2017    09/26/2017 Procedure    Successful placement of a right internal jugular  approach power injectable Port-A-Cath. The  catheter is ready for immediate use.    11/07/2017 Imaging    1. Since 08/18/2017, similar to slight decrease in size of a pancreatic body/tail junction lesion. Cross modality comparison relative to 09/16/2017 MRI is also grossly similar. 2. No evidence of metastatic disease. 3. Aortic Atherosclerosis (ICD10-I70.0).  4. Left nephrolithiasis.     Secondary malignant neoplasm of right ovary ([place])   08/23/2017 Tumor Marker    Patient's tumor was tested for the following markers: CA-125 Results of the tumor marker test revealed 139.8    08/31/2017 Initial Diagnosis    Secondary malignant neoplasm of right ovary ([place])    09/15/2017 Cancer Staging    Staging form: Ovary, Fallopian Tube, and Primary Peritoneal Carcinoma, AJCC 8th Edition - Pathologic: Stage IIB (pT2b, pN0, cM0) - Signed by Heath Lark, MD on 09/15/2017    09/27/2017 Imaging    No evidence of metastatic disease or other acute findings within the thorax.  4 cm low-attenuation mass in pancreatic tail, highly suspicious for pancreatic carcinoma. This is caused splenic vein thrombosis, with new venous collaterals in the left upper quadrant. Consider endoscopic ultrasound with FNA for tissue diagnosis.  Stable benign hepatic hemangioma.     09/27/2017 Tumor Marker    Patient's tumor was tested for the following markers: CA-125 Results of the tumor marker test revealed 39.4    11/02/2017 Tumor Marker    Patient's tumor was tested for the following markers: CA-125 Results of the tumor marker test revealed 19    11/07/2017 Tumor Marker    Patient's tumor was tested for the following markers: CA-125 Results of the tumor marker test revealed 18.3     Pancreatic cancer ([place])   10/13/2017 Pathology Results    Pancreas tail mass, endoscopic ultrasound-guided, fine needle aspiration II (smears and cell block): Adenocarcinoma     REVIEW OF SYSTEMS:   Constitutional: Denies fevers,  chills or abnormal weight loss Eyes: Denies blurriness of vision Ears, nose, mouth, throat, and face: Denies mucositis or sore throat Respiratory: Denies cough, dyspnea or wheezes Cardiovascular: Denies palpitation, chest discomfort or lower extremity swelling Gastrointestinal:  Denies nausea, heartburn or change in bowel habits Skin: Denies abnormal skin rashes Lymphatics: Denies new lymphadenopathy or easy bruising Neurological:Denies numbness, tingling or new weaknesses Behavioral/Psych: Mood is stable, no new changes  All other systems were reviewed with the patient and are negative.  I have reviewed the past medical history, past surgical history, social history and family history with the patient and they are unchanged from previous note.  ALLERGIES:  is allergic to codeine; penicillins; and bactrim [sulfamethoxazole-trimethoprim].  MEDICATIONS:  Current Outpatient Medications  Medication Sig Dispense Refill  . acetaminophen (TYLENOL) 650 MG CR tablet Take 1,300 mg by mouth every 8 (eight) hours.    Marland Kitchen azithromycin (ZITHROMAX) 500 MG tablet Take 1 tablet (500 mg total) by mouth daily. 3 tablet 0  . dexamethasone (DECADRON) 4 MG tablet Take 5 tabs at the night before and 5 tab the morning of chemotherapy, every 3 weeks, by mouth (Patient taking differently: Take 20 mg by mouth See admin instructions. Take 5 tabs at the night before and 5 tab the morning of chemotherapy, every 3 weeks, by mouth) 60 tablet 0  . diclofenac sodium (VOLTAREN) 1 % GEL APPLY 3 GM TO AFFECTED 3 LARGE JOINTS UP TO 3 TIMES A DAY AS NEEDED AS DIRECTED (Patient taking differently: Apply 2 g topically 3 (three) times daily as needed (pain). ) 300 g 1  . fluticasone (FLONASE)  50 MCG/ACT nasal spray Place 2 sprays into both nostrils daily.    Marland Kitchen HYDROmorphone (DILAUDID) 4 MG tablet Take 1 tablet (4 mg total) by mouth every 4 (four) hours as needed for severe pain. 30 tablet 0  . ibuprofen (ADVIL,MOTRIN) 600 MG tablet  Take 1 tablet (600 mg total) by mouth every 6 (six) hours. 30 tablet 0  . lidocaine-prilocaine (EMLA) cream Apply to affected area once (Patient taking differently: Apply 1 application topically daily as needed (port). Apply to affected area once) 30 g 3  . loratadine (CLARITIN) 10 MG tablet Take 10 mg by mouth daily after breakfast.    . omeprazole (PRILOSEC) 20 MG capsule Take 20 mg by mouth 2 (two) times daily before a meal.     . ondansetron (ZOFRAN) 8 MG tablet Take 1 tablet (8 mg total) by mouth every 8 (eight) hours as needed for refractory nausea / vomiting. Start on day 3 after chemo. 30 tablet 1  . predniSONE (DELTASONE) 10 MG tablet TAKE 1 TABLET BY MOUTH EVERY DAY WITH BREAKFAST 30 tablet 0  . predniSONE (DELTASONE) 10 MG tablet Take 2 tablets (20 mg total) by mouth daily with breakfast. 60 tablet 1  . prochlorperazine (COMPAZINE) 10 MG tablet Take 1 tablet (10 mg total) by mouth every 6 (six) hours as needed (Nausea or vomiting). 30 tablet 1  . simethicone (MYLICON) 601 MG chewable tablet Chew 125 mg by mouth every 6 (six) hours as needed for flatulence.    . traMADol (ULTRAM) 50 MG tablet Take 1 tablet (50 mg total) by mouth every 6 (six) hours as needed. 30 tablet 0   No current facility-administered medications for this visit.     PHYSICAL EXAMINATION: ECOG PERFORMANCE STATUS: 1 - Symptomatic but completely ambulatory  Vitals:   11/08/17 0816  BP: (!) 136/93  Pulse: 84  Resp: 18  Temp: 98.4 F (36.9 C)  SpO2: 100%   Filed Weights   11/08/17 0816  Weight: 230 lb 6.4 oz (104.5 kg)    GENERAL:alert, no distress and comfortable Musculoskeletal:no cyanosis of digits and no clubbing  NEURO: alert & oriented x 3 with fluent speech, no focal motor/sensory deficits  LABORATORY DATA:  I have reviewed the data as listed    Component Value Date/Time   NA 143 11/07/2017 0917   K 3.7 11/07/2017 0917   CL 105 11/07/2017 0917   CO2 26 11/07/2017 0917   GLUCOSE 88 11/07/2017  0917   BUN 16 11/07/2017 0917   CREATININE 0.68 11/07/2017 0917   CREATININE 0.66 11/02/2017 1057   CREATININE 0.68 07/27/2017 0851   CALCIUM 9.6 11/07/2017 0917   PROT 7.0 11/07/2017 0917   ALBUMIN 3.4 (L) 11/07/2017 0917   AST 11 (L) 11/07/2017 0917   AST 9 (L) 11/02/2017 1057   ALT 15 11/07/2017 0917   ALT 15 11/02/2017 1057   ALKPHOS 47 11/07/2017 0917   BILITOT 0.2 (L) 11/07/2017 0917   BILITOT 0.3 11/02/2017 1057   GFRNONAA >60 11/07/2017 0917   GFRNONAA >60 11/02/2017 1057   GFRNONAA 106 07/27/2017 0851   GFRAA >60 11/07/2017 0917   GFRAA >60 11/02/2017 1057   GFRAA 123 07/27/2017 0851    No results found for: SPEP, UPEP  Lab Results  Component Value Date   WBC 5.3 11/07/2017   NEUTROABS 2.3 11/07/2017   HGB 10.0 (L) 11/07/2017   HCT 32.9 (L) 11/07/2017   MCV 91.9 11/07/2017   PLT 214 11/07/2017      Chemistry  Component Value Date/Time   NA 143 11/07/2017 0917   K 3.7 11/07/2017 0917   CL 105 11/07/2017 0917   CO2 26 11/07/2017 0917   BUN 16 11/07/2017 0917   CREATININE 0.68 11/07/2017 0917   CREATININE 0.66 11/02/2017 1057   CREATININE 0.68 07/27/2017 0851      Component Value Date/Time   CALCIUM 9.6 11/07/2017 0917   ALKPHOS 47 11/07/2017 0917   AST 11 (L) 11/07/2017 0917   AST 9 (L) 11/02/2017 1057   ALT 15 11/07/2017 0917   ALT 15 11/02/2017 1057   BILITOT 0.2 (L) 11/07/2017 0917   BILITOT 0.3 11/02/2017 1057       RADIOGRAPHIC STUDIES: I have reviewed multiple imaging study with the patient I have personally reviewed the radiological images as listed and agreed with the findings in the report. Dg Chest 2 View  Result Date: 11/02/2017 CLINICAL DATA:  Ovarian carcinoma.  Fever following chemotherapy EXAM: CHEST - 2 VIEW COMPARISON:  Chest radiograph October 08, 2017 and chest CT October 09, 2017 FINDINGS: Port-A-Cath tip is in the superior vena cava near the cavoatrial junction. No pneumothorax. Lungs are clear. Heart size and pulmonary  vascularity are normal. No adenopathy. No bone lesions. IMPRESSION: No edema or consolidation.  No evident adenopathy. Electronically Signed   By: Lowella Grip III M.D.   On: 11/02/2017 10:50   Ct Abdomen Pelvis W Contrast  Result Date: 11/07/2017 CLINICAL DATA:  Right hemicolectomy for colon cancer in 2001. Appendectomy and chemotherapy. Pancreatic neoplasm diagnosed 7/19. Chemotherapy started 8/19. Known gallstones. EXAM: CT ABDOMEN AND PELVIS WITH CONTRAST TECHNIQUE: Multidetector CT imaging of the abdomen and pelvis was performed using the standard protocol following bolus administration of intravenous contrast. CONTRAST:  189m OMNIPAQUE IOHEXOL 300 MG/ML  SOLN COMPARISON:  09/16/2017 MRI.  CT 08/18/2017. FINDINGS: Lower chest: Clear lung bases. Normal heart size without pericardial or pleural effusion. Hepatobiliary: Left hepatic lobe hemangioma. No suspicious liver lesion. Normal gallbladder, without biliary ductal dilatation. Pancreas: The pancreatic head and neck are unremarkable. Again identified is vague hypoattenuation involving the pancreatic body with mild upstream atrophy. Somewhat difficult to quantify secondary to ill definition. Measures on the order of 3.0 x 2.3 cm on image 23/2. Most directly compared to 08/18/2017, where it measured 3.5 x 2.1 cm (when remeasured). There is mild peripancreatic edema, slightly improved. Spleen: Normal in size, without focal abnormality. Adrenals/Urinary Tract: Normal adrenal glands. 5 mm inter/lower pole left renal collecting system calculus. No hydronephrosis. Normal urinary bladder. Stomach/Bowel: Normal stomach, without wall thickening. Right hemicolectomy. Normal small bowel. Vascular/Lymphatic: Aortic atherosclerosis. Splenic vein involvement by tumor with gastroepiploic collaterals. No abdominopelvic adenopathy. Reproductive: Hysterectomy.  No adnexal mass. Other: No significant free fluid. No evidence of omental or peritoneal disease.  Musculoskeletal: No acute osseous abnormality. IMPRESSION: 1. Since 08/18/2017, similar to slight decrease in size of a pancreatic body/tail junction lesion. Cross modality comparison relative to 09/16/2017 MRI is also grossly similar. 2. No evidence of metastatic disease. 3.  Aortic Atherosclerosis (ICD10-I70.0). 4. Left nephrolithiasis. Electronically Signed   By: KAbigail MiyamotoM.D.   On: 11/07/2017 13:57    All questions were answered. The patient knows to call the clinic with any problems, questions or concerns. No barriers to learning was detected.  I spent 25 minutes counseling the patient face to face. The total time spent in the appointment was 30 minutes and more than 50% was on counseling and review of test results  NHeath Lark MD 11/08/2017 9:07 AM

## 2017-11-08 NOTE — Assessment & Plan Note (Signed)
I have reviewed multiple imaging studies with the patient Dana Hicks tumor marker is within normal limits There are no signs of cancer recurrence If Dana Hicks surgeon agreed to perform surgery for pancreatic cancer, we will put Dana Hicks adjuvant chemotherapy on hold She will call me after Dana Hicks meeting with Dana Hicks surgeon before I counseled Dana Hicks chemotherapy that was scheduled for Friday.

## 2017-11-10 ENCOUNTER — Encounter: Payer: Self-pay | Admitting: Genetic Counselor

## 2017-11-10 DIAGNOSIS — C252 Malignant neoplasm of tail of pancreas: Secondary | ICD-10-CM | POA: Diagnosis not present

## 2017-11-10 DIAGNOSIS — M069 Rheumatoid arthritis, unspecified: Secondary | ICD-10-CM | POA: Diagnosis not present

## 2017-11-10 DIAGNOSIS — K8020 Calculus of gallbladder without cholecystitis without obstruction: Secondary | ICD-10-CM | POA: Diagnosis not present

## 2017-11-10 DIAGNOSIS — Z9049 Acquired absence of other specified parts of digestive tract: Secondary | ICD-10-CM | POA: Diagnosis not present

## 2017-11-10 DIAGNOSIS — C541 Malignant neoplasm of endometrium: Secondary | ICD-10-CM | POA: Diagnosis not present

## 2017-11-10 DIAGNOSIS — N390 Urinary tract infection, site not specified: Secondary | ICD-10-CM | POA: Diagnosis not present

## 2017-11-10 DIAGNOSIS — C182 Malignant neoplasm of ascending colon: Secondary | ICD-10-CM | POA: Diagnosis not present

## 2017-11-10 DIAGNOSIS — Z90722 Acquired absence of ovaries, bilateral: Secondary | ICD-10-CM | POA: Diagnosis not present

## 2017-11-10 DIAGNOSIS — C7961 Secondary malignant neoplasm of right ovary: Secondary | ICD-10-CM | POA: Diagnosis not present

## 2017-11-10 DIAGNOSIS — Z90710 Acquired absence of both cervix and uterus: Secondary | ICD-10-CM | POA: Diagnosis not present

## 2017-11-10 NOTE — Progress Notes (Signed)
I spoke with Dana Hicks about my attempts to get the lynch syndrome testing approved.  Per the insurance it appears that they will ask for a peer to peer.  I called [employer] to let them know this, and per their system, it appears that the patient's test is running, that it is covered at 100%, with no OOP costs.  I explained that there was something in the system telling me that I needed to initiate the preauth, but she checked with billing and it was confirmed that there was no OOP cost for the patient.

## 2017-11-11 ENCOUNTER — Inpatient Hospital Stay: Payer: BLUE CROSS/BLUE SHIELD

## 2017-11-11 ENCOUNTER — Other Ambulatory Visit: Payer: Self-pay

## 2017-11-11 ENCOUNTER — Telehealth: Payer: Self-pay

## 2017-11-11 DIAGNOSIS — C259 Malignant neoplasm of pancreas, unspecified: Secondary | ICD-10-CM | POA: Diagnosis not present

## 2017-11-11 DIAGNOSIS — C541 Malignant neoplasm of endometrium: Secondary | ICD-10-CM | POA: Diagnosis not present

## 2017-11-11 DIAGNOSIS — Z79899 Other long term (current) drug therapy: Secondary | ICD-10-CM | POA: Diagnosis not present

## 2017-11-11 DIAGNOSIS — C7961 Secondary malignant neoplasm of right ovary: Secondary | ICD-10-CM | POA: Diagnosis not present

## 2017-11-11 DIAGNOSIS — R300 Dysuria: Secondary | ICD-10-CM

## 2017-11-11 DIAGNOSIS — M0609 Rheumatoid arthritis without rheumatoid factor, multiple sites: Secondary | ICD-10-CM | POA: Diagnosis not present

## 2017-11-11 DIAGNOSIS — M1990 Unspecified osteoarthritis, unspecified site: Secondary | ICD-10-CM | POA: Diagnosis not present

## 2017-11-11 DIAGNOSIS — M79604 Pain in right leg: Secondary | ICD-10-CM | POA: Diagnosis not present

## 2017-11-11 LAB — CBC WITH DIFFERENTIAL (CANCER CENTER ONLY)
Basophils Absolute: 0 10*3/uL (ref 0.0–0.1)
Basophils Relative: 0 %
Eosinophils Absolute: 0 10*3/uL (ref 0.0–0.5)
Eosinophils Relative: 0 %
HCT: 32.3 % — ABNORMAL LOW (ref 34.8–46.6)
Hemoglobin: 9.7 g/dL — ABNORMAL LOW (ref 11.6–15.9)
Lymphocytes Relative: 14 %
Lymphs Abs: 1.1 10*3/uL (ref 0.9–3.3)
MCH: 27.9 pg (ref 25.1–34.0)
MCHC: 30 g/dL — ABNORMAL LOW (ref 31.5–36.0)
MCV: 92.8 fL (ref 79.5–101.0)
Monocytes Absolute: 0.7 10*3/uL (ref 0.1–0.9)
Monocytes Relative: 10 %
Neutro Abs: 5.8 10*3/uL (ref 1.5–6.5)
Neutrophils Relative %: 76 %
Platelet Count: 249 10*3/uL (ref 145–400)
RBC: 3.48 MIL/uL — ABNORMAL LOW (ref 3.70–5.45)
RDW: 16.8 % — ABNORMAL HIGH (ref 11.2–14.5)
WBC Count: 7.6 10*3/uL (ref 3.9–10.3)

## 2017-11-11 LAB — URINALYSIS, COMPLETE (UACMP) WITH MICROSCOPIC
Bilirubin Urine: NEGATIVE
Glucose, UA: NEGATIVE mg/dL
Ketones, ur: NEGATIVE mg/dL
Leukocytes, UA: NEGATIVE
Nitrite: NEGATIVE
Protein, ur: NEGATIVE mg/dL
Specific Gravity, Urine: 1.01 (ref 1.005–1.030)
pH: 7 (ref 5.0–8.0)

## 2017-11-11 LAB — BASIC METABOLIC PANEL - CANCER CENTER ONLY
Anion gap: 9 (ref 5–15)
BUN: 10 mg/dL (ref 6–20)
CO2: 28 mmol/L (ref 22–32)
Calcium: 9.5 mg/dL (ref 8.9–10.3)
Chloride: 102 mmol/L (ref 98–111)
Creatinine: 0.65 mg/dL (ref 0.44–1.00)
GFR, Est AFR Am: >60 mL/min (ref >60)
GFR, Estimated: >60 mL/min (ref >60)
Glucose, Bld: 98 mg/dL (ref 70–99)
Potassium: 3.9 mmol/L (ref 3.5–5.1)
Sodium: 139 mmol/L (ref 135–145)

## 2017-11-11 MED ORDER — NITROFURANTOIN MONOHYD MACRO 100 MG PO CAPS: 100.0000 mg | ORAL_CAPSULE | Freq: Two times a day (BID) | ORAL | 0 refills | Status: AC

## 2017-11-11 NOTE — Telephone Encounter (Signed)
She called back. Unable to contact Dr. Carlis Abbott at [facility] because Dr. Carlis Abbott is out of the office.  Called back. Reviewed above with Joylene John, NP. Given lab appt at 1130 today. Reviewed with her on how to do a urine specimen. She verbalized understanding. Scheduling message sent.

## 2017-11-11 NOTE — Telephone Encounter (Signed)
Called back per Joylene John, NP. Rx sent to [facility] pharmacy for Macrobid 100 mg BID X 5 days. Instructed to go to ER if fever persists over the weekend. She verbalized understanding.

## 2017-11-11 NOTE — Telephone Encounter (Signed)
She called and left a message to call her. She needs to cancel today's chemo appt, her surgery is scheduled with Dr. Gerarda Fraction. She is running a fever today and she completed her antibiotics yesterday.  Called back. Appt canceled for today for chemotherapy. She has a temperature of 100.8 today. Fever started yesterday afternoon. She completed Cipro yesterday. She is being treated for UTI and sinus infection. She is complaining of lower back pain and nasal stuffiness.   I told her that Dr. Alvy Bimler is not in the office today. Offered to talk with GYN/ Dr. Gerarda Fraction office. She declined offer. She saw Dr. Carlis Abbott at [facility] yesterday and will contact there office. Instructed to call the office back if she needs further assistance. She verbalized understanding.

## 2017-11-13 LAB — URINE CULTURE

## 2017-11-14 ENCOUNTER — Encounter: Payer: Self-pay | Admitting: Physician Assistant

## 2017-11-14 ENCOUNTER — Telehealth: Payer: Self-pay | Admitting: Oncology

## 2017-11-14 ENCOUNTER — Ambulatory Visit (INDEPENDENT_AMBULATORY_CARE_PROVIDER_SITE_OTHER): Payer: BLUE CROSS/BLUE SHIELD | Admitting: Physician Assistant

## 2017-11-14 VITALS — BP 124/76 | HR 80 | Resp 14 | Ht 62.0 in | Wt 226.6 lb

## 2017-11-14 DIAGNOSIS — Z1589 Genetic susceptibility to other disease: Secondary | ICD-10-CM | POA: Diagnosis not present

## 2017-11-14 DIAGNOSIS — M170 Bilateral primary osteoarthritis of knee: Secondary | ICD-10-CM

## 2017-11-14 DIAGNOSIS — Z79899 Other long term (current) drug therapy: Secondary | ICD-10-CM

## 2017-11-14 DIAGNOSIS — M0609 Rheumatoid arthritis without rheumatoid factor, multiple sites: Secondary | ICD-10-CM | POA: Diagnosis not present

## 2017-11-14 DIAGNOSIS — D490 Neoplasm of unspecified behavior of digestive system: Secondary | ICD-10-CM

## 2017-11-14 DIAGNOSIS — C7961 Secondary malignant neoplasm of right ovary: Secondary | ICD-10-CM

## 2017-11-14 DIAGNOSIS — Z8261 Family history of arthritis: Secondary | ICD-10-CM

## 2017-11-14 DIAGNOSIS — Z889 Allergy status to unspecified drugs, medicaments and biological substances status: Secondary | ICD-10-CM

## 2017-11-14 DIAGNOSIS — Z85038 Personal history of other malignant neoplasm of large intestine: Secondary | ICD-10-CM

## 2017-11-14 NOTE — Telephone Encounter (Addendum)
Dana Hicks called back and said she forgot to mention that her nose is feeling "stuffier" today. She is wondering if macrobid would treat a sinus infection.  Advised her that it will treat the UTI but not a sinus infection.  She verbalized agreement and said that she has been tapering down on prednisone so she is not sure if that is causing her stuffiness.

## 2017-11-14 NOTE — Telephone Encounter (Signed)
Dana Hicks said her temperature has been trending lower - she said her temp this morning was 98.5 and 99 before she called.  She said that her lower back is still hurting.  She will call if she starts to run a fever again or her symptoms get worse.

## 2017-11-14 NOTE — Telephone Encounter (Signed)
Left a message for patient regarding how she is feeling.  Requested a return call.

## 2017-11-15 ENCOUNTER — Telehealth: Payer: Self-pay

## 2017-11-15 NOTE — Telephone Encounter (Signed)
She called and left a message to call her. Her fever is back.  Called back. Fever up to 100.5 last night and 99.5 this morning. She is taking tylenol prn. Denies chills and complaining of lower back pain. Dr. Carlis Abbott decreased her Prednisone to 5 mg daily only because without the Prednisone she has problems walking. She will back tomorrow with a update.

## 2017-11-16 ENCOUNTER — Telehealth: Payer: Self-pay | Admitting: *Deleted

## 2017-11-16 NOTE — Telephone Encounter (Signed)
Called and moved the patient's appt from 11/22 to 11/13.

## 2017-11-17 DIAGNOSIS — R9431 Abnormal electrocardiogram [ECG] [EKG]: Secondary | ICD-10-CM | POA: Diagnosis not present

## 2017-11-17 DIAGNOSIS — R000 Tachycardia, unspecified: Secondary | ICD-10-CM | POA: Diagnosis not present

## 2017-11-17 DIAGNOSIS — C252 Malignant neoplasm of tail of pancreas: Secondary | ICD-10-CM | POA: Diagnosis not present

## 2017-11-18 ENCOUNTER — Encounter: Payer: Self-pay | Admitting: Genetic Counselor

## 2017-11-18 DIAGNOSIS — Z1379 Encounter for other screening for genetic and chromosomal anomalies: Secondary | ICD-10-CM | POA: Insufficient documentation

## 2017-11-21 ENCOUNTER — Telehealth: Payer: Self-pay | Admitting: Hematology and Oncology

## 2017-11-21 ENCOUNTER — Inpatient Hospital Stay (HOSPITAL_BASED_OUTPATIENT_CLINIC_OR_DEPARTMENT_OTHER): Payer: BLUE CROSS/BLUE SHIELD | Admitting: Hematology and Oncology

## 2017-11-21 ENCOUNTER — Ambulatory Visit: Payer: BLUE CROSS/BLUE SHIELD | Admitting: Genetic Counselor

## 2017-11-21 ENCOUNTER — Ambulatory Visit (HOSPITAL_COMMUNITY)
Admission: RE | Admit: 2017-11-21 | Discharge: 2017-11-21 | Disposition: A | Discharge status: Home / Self Care | Payer: BLUE CROSS/BLUE SHIELD | Source: Ambulatory Visit | Attending: Hematology and Oncology | Admitting: Hematology and Oncology

## 2017-11-21 ENCOUNTER — Other Ambulatory Visit: Payer: Self-pay | Admitting: Hematology and Oncology

## 2017-11-21 DIAGNOSIS — M0609 Rheumatoid arthritis without rheumatoid factor, multiple sites: Secondary | ICD-10-CM

## 2017-11-21 DIAGNOSIS — Z79899 Other long term (current) drug therapy: Secondary | ICD-10-CM | POA: Diagnosis not present

## 2017-11-21 DIAGNOSIS — C541 Malignant neoplasm of endometrium: Secondary | ICD-10-CM

## 2017-11-21 DIAGNOSIS — C259 Malignant neoplasm of pancreas, unspecified: Secondary | ICD-10-CM

## 2017-11-21 DIAGNOSIS — R300 Dysuria: Secondary | ICD-10-CM | POA: Diagnosis not present

## 2017-11-21 DIAGNOSIS — E669 Obesity, unspecified: Secondary | ICD-10-CM | POA: Diagnosis not present

## 2017-11-21 DIAGNOSIS — M79604 Pain in right leg: Secondary | ICD-10-CM

## 2017-11-21 DIAGNOSIS — C7961 Secondary malignant neoplasm of right ovary: Secondary | ICD-10-CM

## 2017-11-21 DIAGNOSIS — G8929 Other chronic pain: Secondary | ICD-10-CM | POA: Insufficient documentation

## 2017-11-21 DIAGNOSIS — Z1379 Encounter for other screening for genetic and chromosomal anomalies: Secondary | ICD-10-CM

## 2017-11-21 DIAGNOSIS — M25561 Pain in right knee: Secondary | ICD-10-CM | POA: Insufficient documentation

## 2017-11-21 DIAGNOSIS — Z1509 Genetic susceptibility to other malignant neoplasm: Secondary | ICD-10-CM

## 2017-11-21 DIAGNOSIS — M1990 Unspecified osteoarthritis, unspecified site: Secondary | ICD-10-CM | POA: Diagnosis not present

## 2017-11-21 DIAGNOSIS — Z85038 Personal history of other malignant neoplasm of large intestine: Secondary | ICD-10-CM

## 2017-11-21 MED ORDER — TRAMADOL HCL 50 MG PO TABS: 100.0000 mg | ORAL_TABLET | Freq: Four times a day (QID) | ORAL | 0 refills | Status: DC | PRN

## 2017-11-21 NOTE — Telephone Encounter (Signed)
Gave avs and calendar ° °

## 2017-11-21 NOTE — Progress Notes (Signed)
Right lower extremity venous duplex completed - Preliminary results - There is no evidence of a DVT or Baker's cyst. Toma Copier, RVS  11/21/2017, 9:52 AM

## 2017-11-22 ENCOUNTER — Encounter: Payer: Self-pay | Admitting: Hematology and Oncology

## 2017-11-22 DIAGNOSIS — Z1509 Genetic susceptibility to other malignant neoplasm: Secondary | ICD-10-CM | POA: Insufficient documentation

## 2017-11-22 NOTE — Progress Notes (Signed)
[facility] OFFICE PROGRESS NOTE  Patient Care Team: [name], MD as PCP - General [facility])  ASSESSMENT & PLAN:  Pancreatic cancer [facility]) She is currently scheduled for surgery at [facility] on November 25, 2017 for definitive surgery I discussed the plan briefly with the patient Plan to see her approximately 3 weeks from surgery to review pathology report and plan of care.  She will likely be commenced on adjuvant pancreatic cancer regimen next month.  Anterior leg pain, right She has severe right leg pain, and was seen acutely with a appointment today.  Urgent ultrasound of the lower extremity excluded DVT.  I suspect she might have muscle tear She is currently on 5 mg of prednisone therapy She has been taking tramadol alternate with Dilaudid as needed for pain control She felt that tramadol was more effective to treat her chronic pain. I have refilled her prescription tramadol and plan to increase to 100 mg as needed every 6 hours in the short-term for pain control.  Rheumatoid arthritis of multiple sites with negative rheumatoid factor (HCC) Her chronic arthritis pain is stable despite recent reduction of prednisone to 5 mg daily.   No orders of the defined types were placed in this encounter.   INTERVAL HISTORY: Please see below for problem oriented charting. She returns to be seen urgently due to acute right lower pain.  She is concerned she might have DVT. She denies recent injury.  Her acute pain just started recently. For her chronic pain/arthritis, she is currently on 5 mg of prednisone daily. She has been taking tramadol alternate with Dilaudid as needed for this pain.  She felt that her anterior thigh appears somewhat more swollen.  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 Mixed attenuation mass emanates from the right adnexa measuring 10.8 x 8.0 cm very suspicious for right ovarian carcinoma. 2. Abnormality of the tail of the pancreas may be due to mild pancreatitis and small pseudocyst formation, but neoplasm cannot be excluded. 3. Small amount of ascites within abdomen and pelvis. 4. Small nonobstructing renal calculi bilaterally.     08/30/2017 Pathology Results    1. Uterus and cervix, with left fallopian tube - ENDOMETRIOID ADENOCARCINOMA, FIGO GRADE I, ARISING IN A BACKGROUND OF DIFFUSE COMPLEX ATYPICAL HYPERPLASIA. - CARCINOMA INVADES FOR OF DEPTH OF 0.2 CM WHERE THICKNESS OF MYOMETRIAL WALL IS 2.1 CM. - ALL RESECTION MARGINS ARE NEGATIVE FOR CARCINOMA. - NEGATIVE FOR LYMPHOVASCULAR OR PERINEURAL INVASION. - CERVICAL STROMA IS NOT INVOLVED. - SEE ONCOLOGY TABLE. - SEE NOTE 2. Ovary and fallopian tube, right - PRIMARY OVARIAN ENDOMETRIOID ADENOCARCINOMA, FIGO GRADE II, 12 CM. - THE OVARIAN SURFACE IS FOCALLY INVOLVED BY CARCINOMA. - NEGATIVE FOR LYMPHOVASCULAR INVASION. - BENIGN UNREMARKABLE FALLOPIAN TUBE, NEGATIVE FOR CARCINOMA. - SEE ONCOLOGY TABLE. - SEE NOTE 3. Cul-de-sac biopsy - METASTATIC ADENOCARCINOMA, MOST CONSISTENT WITH PRIMARY OVARIAN ENDOMETRIOID ADENOCARCINOMA. 4. Ovary, left - BENIGN UNREMARKABLE OVARY, NEGATIVE FOR MALIGNANCY. Microscopic Comment 1. UTERUS, CARCINOMA OR CARCINOSARCOMA Procedure: Total hysterectomy with bilateral salpingo-oophorectomy. Histologic type: Endometrioid adenocarcinoma. Histologic Grade: FIGO Grade I Myometrial invasion: Depth of invasion: 2 mm Myometrial thickness: 21 mm Uterine Serosa Involvement: Not identified Cervical stromal involvement: Not identified Extent of involvement of other organs: Not applicable Lymphovascular invasion: Not identified  Regional Lymph Nodes: Examined: 0 Sentinel 0 Non-sentinel 0 Total Tumor block for ancillary  studies: 1H MMR / MSI testing: Pending Pathologic Stage Classification (pTNM, AJCC 8th edition): pT1a, pNX (v4.1.0.0) 2. OVARY or FALLOPIAN TUBE or PRIMARY PERITONEUM: Procedure: Salpingo-oophorectomy Specimen Integrity: Intact Tumor Site: Right ovary Ovarian Surface Involvement (required only if applicable): Focally involved by carcinoma Fallopian Tube Surface Involvement (required only if applicable): Not identified Tumor Size: 12 cm Histologic Type: Endometrioid adenocarcinoma Histologic Grade: Grade II Implants (required for advanced stage serous/seromucinous borderline tumors only): Not applicable Other Tissue/ Organ Involvement: Cul de sac biopsy involved by tumor Largest Extrapelvic Peritoneal Focus (required only if applicable): Not applicable Peritoneal/Ascitic Fluid: Negative for carcinoma (case # [id number]) Treatment Effect (required only for high-grade serous carcinomas): Not applicable Regional Lymph Nodes: No lymph nodes submitted or found Number of Lymph Nodes Examined: 0 Pathologic Stage Classification (pTNM, AJCC 8th Edition): pT2b, pN0 Representative Tumor Block: 2B and 2E 1. Molecular study for microsatellite instability and immunohistochemical stains for MMR-related proteins are pending and will be reported in an addendum. 2. Immunohistochemical stain show that the ovarian tumor is positive for CK7 and PAX8 (both diffuse), CDX2 (patchy and weak); and negative for CK20. This immunoprofile is consistent with the above diagnosis. Dr. Lyndon Code has reviewed this case and concurs with the above diagnosis. Molecular study for microsatellite instability and immunohistochemical stains for MMR-related proteins are pending and will be reported in an addendum    08/30/2017 Genetic Testing    Patient has genetic testing done for MSI  Results revealed patient has the following mutation(s): loss of [facility] 2    08/30/2017 Surgery    Surgeon: Mart Piggs, MD Pre-operative Diagnosis:   1. Adnexal mass 2. Abnormal uterine bleeding 3. H/o Cecal CA  Post-operative Diagnosis:  1. Adhesive disease post colon resection 2. Endometrial cancer NOS 3. Adenocarcinoma unknown origin, right ovary, suspicious for GI primary  Operation:  1. Lysis of adhesions ~30 minutes 2. Robotic-assisted laparoscopic total hysterectomy with right salpingo-oophorectomy and left salpingectomy 3. Left oophorectomy (RA-laparoscopic) 4. Pelvic washings  Findings: Adhesions of omentum to anterior abdominal wall. Enlarged cystic right ovary ~10cm. Uterus had small nodules on serosa near where right adnexa was intimate with the surface. No obvious intraoperative rupture of cyst, although in 2 areas the wall was thin and one of these areas had some bleeding. Slight scarring of left bladder dome to LUS/cervix. Uterus on frozen section c/w hyperplasia and a small focus of endometrial CA - no myo invasion, <2cm in size. Frozen section on the right adnexa was carcinoma, met from colon or possibly Gyn, favor GI primary, defer to permanent. Left ovary was WNL.      09/15/2017 Cancer Staging    Staging form: Corpus Uteri - Carcinoma and Carcinosarcoma, AJCC 8th Edition - Pathologic: FIGO Stage IA (pT1a, pN0, cM0) - Signed by Heath Lark, MD on 09/15/2017    09/26/2017 Procedure    Successful placement of a right internal jugular approach power injectable Port-A-Cath. The catheter is ready for immediate use.    11/07/2017 Imaging    1. Since 08/18/2017, similar to slight decrease in size of a pancreatic body/tail junction lesion. Cross modality comparison relative to 09/16/2017 MRI is also grossly similar. 2. No evidence of metastatic disease. 3. Aortic Atherosclerosis (ICD10-I70.0).  4. Left nephrolithiasis.    11/18/2017 Genetic Testing    MSH2 c.2237dupT pathogenic mutation identified in the CancerNext panel.  The CancerNext gene panel offered by [employer] includes sequencing and  rearrangement  analysis for the following 34 genes:   APC, ATM, BARD1, BMPR1A, BRCA1, BRCA2, BRIP1, CDH1, CDK4, CDKN2A, CHEK2, DICER1, HOXB13, EPCAM, GREM1, MLH1, MRE11A, MSH2, MSH6, MUTYH, NBN, NF1, PALB2, PMS2, POLD1, POLE, PTEN, RAD50, RAD51C, RAD51D, SMAD4, SMARCA4, STK11, and TP53.  The report date is November 18, 2017.  MSH2 [id number] pathogenic mutation identified on somatic testing.  These results are consistent with a diagnosis of Lynch syndrome.     Secondary malignant neoplasm of right ovary ([place])   08/23/2017 Tumor Marker    Patient's tumor was tested for the following markers: CA-125 Results of the tumor marker test revealed 139.8    08/31/2017 Initial Diagnosis    Secondary malignant neoplasm of right ovary ([place])    09/15/2017 Cancer Staging    Staging form: Ovary, Fallopian Tube, and Primary Peritoneal Carcinoma, AJCC 8th Edition - Pathologic: Stage IIB (pT2b, pN0, cM0) - Signed by Heath Lark, MD on 09/15/2017    09/27/2017 Imaging    No evidence of metastatic disease or other acute findings within the thorax.  4 cm low-attenuation mass in pancreatic tail, highly suspicious for pancreatic carcinoma. This is caused splenic vein thrombosis, with new venous collaterals in the left upper quadrant. Consider endoscopic ultrasound with FNA for tissue diagnosis.  Stable benign hepatic hemangioma.     09/27/2017 Tumor Marker    Patient's tumor was tested for the following markers: CA-125 Results of the tumor marker test revealed 39.4    11/02/2017 Tumor Marker    Patient's tumor was tested for the following markers: CA-125 Results of the tumor marker test revealed 19    11/07/2017 Tumor Marker    Patient's tumor was tested for the following markers: CA-125 Results of the tumor marker test revealed 18.3    11/18/2017 Genetic Testing    MSH2 c.2237dupT pathogenic mutation identified in the CancerNext panel.  The CancerNext gene panel offered by [employer] includes sequencing and  rearrangement analysis for the following 34 genes:   APC, ATM, BARD1, BMPR1A, BRCA1, BRCA2, BRIP1, CDH1, CDK4, CDKN2A, CHEK2, DICER1, HOXB13, EPCAM, GREM1, MLH1, MRE11A, MSH2, MSH6, MUTYH, NBN, NF1, PALB2, PMS2, POLD1, POLE, PTEN, RAD50, RAD51C, RAD51D, SMAD4, SMARCA4, STK11, and TP53.  The report date is November 18, 2017.  MSH2 [id number] pathogenic mutation identified on somatic testing.  These results are consistent with a diagnosis of Lynch syndrome.     Pancreatic cancer ([place])   10/13/2017 Pathology Results    Pancreas tail mass, endoscopic ultrasound-guided, fine needle aspiration II (smears and cell block): Adenocarcinoma    11/18/2017 Genetic Testing    MSH2 c.2237dupT pathogenic mutation identified in the CancerNext panel.  The CancerNext gene panel offered by [employer] includes sequencing and rearrangement analysis for the following 34 genes:   APC, ATM, BARD1, BMPR1A, BRCA1, BRCA2, BRIP1, CDH1, CDK4, CDKN2A, CHEK2, DICER1, HOXB13, EPCAM, GREM1, MLH1, MRE11A, MSH2, MSH6, MUTYH, NBN, NF1, PALB2, PMS2, POLD1, POLE, PTEN, RAD50, RAD51C, RAD51D, SMAD4, SMARCA4, STK11, and TP53.  The report date is November 18, 2017.  MSH2 [id number] pathogenic mutation identified on somatic testing.  These results are consistent with a diagnosis of Lynch syndrome.     REVIEW OF SYSTEMS:   Constitutional: Denies fevers, chills or abnormal weight loss Eyes: Denies blurriness of vision Ears, nose, mouth, throat, and face: Denies mucositis or sore throat Respiratory: Denies cough, dyspnea or wheezes Cardiovascular: Denies palpitation, chest discomfort or lower extremity swelling Gastrointestinal:  Denies nausea, heartburn or change in bowel habits Skin: Denies abnormal skin rashes Lymphatics: Denies  new lymphadenopathy or easy bruising Neurological:Denies numbness, tingling or new weaknesses Behavioral/Psych: Mood is stable, no new changes  All other systems were  reviewed with the patient and are negative.  I have reviewed the past medical history, past surgical history, social history and family history with the patient and they are unchanged from previous note.  ALLERGIES:  is allergic to codeine; penicillins; and bactrim [sulfamethoxazole-trimethoprim].  MEDICATIONS:  Current Outpatient Medications  Medication Sig Dispense Refill  . acetaminophen (TYLENOL) 650 MG CR tablet Take 1,300 mg by mouth every 8 (eight) hours.    Marland Kitchen HYDROmorphone (DILAUDID) 4 MG tablet Take 1 tablet (4 mg total) by mouth every 4 (four) hours as needed for severe pain. 30 tablet 0  . lidocaine-prilocaine (EMLA) cream Apply to affected area once (Patient taking differently: Apply 1 application topically daily as needed (port). Apply to affected area once) 30 g 3  . loratadine (CLARITIN) 10 MG tablet Take 10 mg by mouth daily after breakfast.    . omeprazole (PRILOSEC) 20 MG capsule Take 20 mg by mouth 2 (two) times daily before a meal.     . ondansetron (ZOFRAN) 8 MG tablet Take 1 tablet (8 mg total) by mouth every 8 (eight) hours as needed for refractory nausea / vomiting. Start on day 3 after chemo. 30 tablet 1  . predniSONE (DELTASONE) 10 MG tablet TAKE 1 TABLET BY MOUTH EVERY DAY WITH BREAKFAST (Patient taking differently: Take 5 mg by mouth daily with breakfast. ) 30 tablet 0  . prochlorperazine (COMPAZINE) 10 MG tablet Take 1 tablet (10 mg total) by mouth every 6 (six) hours as needed (Nausea or vomiting). 30 tablet 1  . simethicone (MYLICON) 767 MG chewable tablet Chew 125 mg by mouth every 6 (six) hours as needed for flatulence.    . traMADol (ULTRAM) 50 MG tablet Take 2 tablets (100 mg total) by mouth every 6 (six) hours as needed. 90 tablet 0   No current facility-administered medications for this visit.     PHYSICAL EXAMINATION: ECOG PERFORMANCE STATUS: 1 - Symptomatic but completely ambulatory  Vitals:   11/21/17 1017  BP: 139/71  Pulse: 89  Resp: 18  Temp:  98.2 F (36.8 C)  SpO2: 100%   Filed Weights   11/21/17 1017  Weight: 228 lb 3.2 oz (103.5 kg)    GENERAL:alert, no distress and comfortable Musculoskeletal:no cyanosis of digits and no clubbing.  Her right anterior thigh is mildly more swollen than the left NEURO: alert & oriented x 3 with fluent speech, no focal motor/sensory deficits  LABORATORY DATA:  I have reviewed the data as listed    Component Value Date/Time   NA 139 11/11/2017 1142   K 3.9 11/11/2017 1142   CL 102 11/11/2017 1142   CO2 28 11/11/2017 1142   GLUCOSE 98 11/11/2017 1142   BUN 10 11/11/2017 1142   CREATININE 0.65 11/11/2017 1142   CREATININE 0.68 07/27/2017 0851   CALCIUM 9.5 11/11/2017 1142   PROT 7.0 11/07/2017 0917   ALBUMIN 3.4 (L) 11/07/2017 0917   AST 11 (L) 11/07/2017 0917   AST 9 (L) 11/02/2017 1057   ALT 15 11/07/2017 0917   ALT 15 11/02/2017 1057   ALKPHOS 47 11/07/2017 0917   BILITOT 0.2 (L) 11/07/2017 0917   BILITOT 0.3 11/02/2017 1057   GFRNONAA >60 11/11/2017 1142   GFRNONAA 106 07/27/2017 0851   GFRAA >60 11/11/2017 1142   GFRAA 123 07/27/2017 0851    No results found for: SPEP, UPEP  Lab Results  Component Value Date   WBC 7.6 11/11/2017   NEUTROABS 5.8 11/11/2017   HGB 9.7 (L) 11/11/2017   HCT 32.3 (L) 11/11/2017   MCV 92.8 11/11/2017   PLT 249 11/11/2017      Chemistry      Component Value Date/Time   NA 139 11/11/2017 1142   K 3.9 11/11/2017 1142   CL 102 11/11/2017 1142   CO2 28 11/11/2017 1142   BUN 10 11/11/2017 1142   CREATININE 0.65 11/11/2017 1142   CREATININE 0.68 07/27/2017 0851      Component Value Date/Time   CALCIUM 9.5 11/11/2017 1142   ALKPHOS 47 11/07/2017 0917   AST 11 (L) 11/07/2017 0917   AST 9 (L) 11/02/2017 1057   ALT 15 11/07/2017 0917   ALT 15 11/02/2017 1057   BILITOT 0.2 (L) 11/07/2017 0917   BILITOT 0.3 11/02/2017 1057     Ultrasound venous Doppler was negative for DVT  RADIOGRAPHIC STUDIES: I have personally reviewed the  radiological images as listed and agreed with the findings in the report. Dg Chest 2 View  Result Date: 11/02/2017 CLINICAL DATA:  Ovarian carcinoma.  Fever following chemotherapy EXAM: CHEST - 2 VIEW COMPARISON:  Chest radiograph October 08, 2017 and chest CT October 09, 2017 FINDINGS: Port-A-Cath tip is in the superior vena cava near the cavoatrial junction. No pneumothorax. Lungs are clear. Heart size and pulmonary vascularity are normal. No adenopathy. No bone lesions. IMPRESSION: No edema or consolidation.  No evident adenopathy. Electronically Signed   By: Lowella Grip III M.D.   On: 11/02/2017 10:50   Ct Abdomen Pelvis W Contrast  Result Date: 11/07/2017 CLINICAL DATA:  Right hemicolectomy for colon cancer in 2001. Appendectomy and chemotherapy. Pancreatic neoplasm diagnosed 7/19. Chemotherapy started 8/19. Known gallstones. EXAM: CT ABDOMEN AND PELVIS WITH CONTRAST TECHNIQUE: Multidetector CT imaging of the abdomen and pelvis was performed using the standard protocol following bolus administration of intravenous contrast. CONTRAST:  133m OMNIPAQUE IOHEXOL 300 MG/ML  SOLN COMPARISON:  09/16/2017 MRI.  CT 08/18/2017. FINDINGS: Lower chest: Clear lung bases. Normal heart size without pericardial or pleural effusion. Hepatobiliary: Left hepatic lobe hemangioma. No suspicious liver lesion. Normal gallbladder, without biliary ductal dilatation. Pancreas: The pancreatic head and neck are unremarkable. Again identified is vague hypoattenuation involving the pancreatic body with mild upstream atrophy. Somewhat difficult to quantify secondary to ill definition. Measures on the order of 3.0 x 2.3 cm on image 23/2. Most directly compared to 08/18/2017, where it measured 3.5 x 2.1 cm (when remeasured). There is mild peripancreatic edema, slightly improved. Spleen: Normal in size, without focal abnormality. Adrenals/Urinary Tract: Normal adrenal glands. 5 mm inter/lower pole left renal collecting system  calculus. No hydronephrosis. Normal urinary bladder. Stomach/Bowel: Normal stomach, without wall thickening. Right hemicolectomy. Normal small bowel. Vascular/Lymphatic: Aortic atherosclerosis. Splenic vein involvement by tumor with gastroepiploic collaterals. No abdominopelvic adenopathy. Reproductive: Hysterectomy.  No adnexal mass. Other: No significant free fluid. No evidence of omental or peritoneal disease. Musculoskeletal: No acute osseous abnormality. IMPRESSION: 1. Since 08/18/2017, similar to slight decrease in size of a pancreatic body/tail junction lesion. Cross modality comparison relative to 09/16/2017 MRI is also grossly similar. 2. No evidence of metastatic disease. 3.  Aortic Atherosclerosis (ICD10-I70.0). 4. Left nephrolithiasis. Electronically Signed   By: KAbigail MiyamotoM.D.   On: 11/07/2017 13:57    All questions were answered. The patient knows to call the clinic with any problems, questions or concerns. No barriers to learning was detected.  I  spent 15 minutes counseling the patient face to face. The total time spent in the appointment was 20 minutes and more than 50% was on counseling and review of test results  Heath Lark, MD 11/22/2017 7:26 AM

## 2017-11-22 NOTE — Assessment & Plan Note (Signed)
She has severe right leg pain, and was seen acutely with a appointment today.  Urgent ultrasound of the lower extremity excluded DVT.  I suspect she might have muscle tear She is currently on 5 mg of prednisone therapy She has been taking tramadol alternate with Dilaudid as needed for pain control She felt that tramadol was more effective to treat her chronic pain. I have refilled her prescription tramadol and plan to increase to 100 mg as needed every 6 hours in the short-term for pain control.

## 2017-11-22 NOTE — Assessment & Plan Note (Signed)
Her chronic arthritis pain is stable despite recent reduction of prednisone to 5 mg daily.

## 2017-11-22 NOTE — Progress Notes (Signed)
REFERRING PROVIDER: Heath Lark, MD [address] [place], [place] [address]  PRIMARY PROVIDER:  [name], MD  PRIMARY REASON FOR VISIT:  1. Genetic testing   2. Lynch syndrome   3. Malignant neoplasm of pancreas, unspecified location of malignancy ([place])   4. History of colon cancer   5. Endometrial cancer ([place])     GENETIC TEST RESULTS   Patient Name: NASHLY OLSSON Patient Age: 42 y.o. Encounter Date: 11/21/2017  HPI: Ms. Fleer was previously seen in the [place] [facility] due to a family of cancer and concerns regarding a hereditary predisposition to cancer. Please refer to our prior cancer genetics clinic note for more information regarding Ms. Skalicky medical, social and family histories, and our assessment and recommendations, at the time. Ms. Cherney recent genetic test results were disclosed to her, as were recommendations warranted by these results. These results and recommendations are discussed in more detail below.   FAMILY HISTORY:  We obtained a detailed, 4-generation family history.  Significant diagnoses are listed below: Family History  Problem Relation Age of Onset  . Arthritis/Rheumatoid Mother   . Uterine cancer Mother 40  . Allergic rhinitis Father   . Heart disease Father   . Drug abuse Son   . Other Maternal Aunt        Unknown GYN CA  . Colon cancer Maternal Uncle 27  . Diabetes Maternal Grandmother   . Cancer Maternal Grandfather        d. 50s of liver/lung cancer  . Angioedema Neg Hx   . Asthma Neg Hx   . Atopy Neg Hx   . Eczema Neg Hx   . Immunodeficiency Neg Hx   . Urticaria Neg Hx     The patient has a son and daughter who are cancer free.  She is an only child.  Her mother was diagnosed with Stage IV uterine cancer at 8 and died at 24.  Her father is living at 74.  The patient's mother had a brother and sister.  The brother had CP and died of colon cancer at 66.  The sister had an unknown GYN cancer and  died at 8.  The maternal grandparents are deceased.  The grandmother died of diabetes and old age and the grandfather died of cancer in his 8's.  The patient's father has four sisters.  All are living and cancer free.  His parents are deceased from non-cancer related issues.  Ms. Redmond is unaware of previous family history of genetic testing for hereditary cancer risks. Patient's maternal ancestors are of Caucasian NOS descent, and paternal ancestors are of Caucasian NOS and [place] descent. There is no reported Ashkenazi Jewish ancestry. There is no known consanguinity.  GENETIC TESTING:  At the time of Ms. Shippey visit, we recommended she pursue genetic testing of the TumorNext Lynch with CancerNext test.  This is paired germline and tumor analysis for enhanced diagnosis of Lynch Syndrome. The genetic testing was reported out by [facility] on November 18, 2017 identified a single, heterozygous pathogenic gene mutation called MSH2, [id number] confirming the diagnosis of Lynch syndrome.There were no deleterious mutations in APC, ATM, BARD1, BMPR1A, BRCA1, BRCA2, BRIP1, CDH1, CDK4, CDKN2A, CHEK2, DICER1, HOXB13, EPCAM, GREM1, MLH1, MRE11A, MSH6, MUTYH, NBN, NF1, PALB2, PMS2, POLD1, POLE, PTEN, RAD50, RAD51C, RAD51D, SMAD4, SMARCA4, STK11, and TP53.  Tumor testing also found a pathogenic mutation in MSH2 called [id number].  This is thought to be the second hit in her uterine tumor  that caused that cancer, and confirms the diagnosis of Lynch syndrome.  A copy of the test report has been scanned into Epic for review.   SCREENING RECOMMENDATIONS: We discussed the implications of Lynch syndrome for Ms. Reiger, and discussed who else in the family should have genetic testing. We recommended Ms. Gallina follow management guidelines for Lynch syndrome; all of which are outlined below. These can be coordinated by Ms. Melony Overly GI doctor or her primary provider.   1. Annual colonoscopy. For  patients with colorectal adenocarcinoma, segmental or extended colectomy may be considered.  2. While there is no clear evidence to support screening for stomach and small bowel cancer, an upper endoscopy can be considered at 3-5 year intervals beginning at age 110-35. However, whether to have this screening is best determined by the gastroenterologist.   3. Annual urinalysis.   For women with Lynch syndrome, unlike the effective surveillance plan for colorectal cancer risk, there is no professional agreement regarding management for the increased risk of uterine and ovarian cancer. However, we are available to help women and their providers establish an individualized surveillance plan. Ms. Renville has already had uterine and ovarian cancer and therefore the below recommendations would be appropriate for female relatives.  It is also important for women to understand the following:   1. Women should seek medical attention if they experience abnormal vaginal bleeding.  2. Some providers may still recommend vaginal ultrasounds, uterine biopsies (for uterine cancer risk) and/or CA-125 analysis ( for ovarian cancer risk), even though these have not been shown to be effective.  3. A hysterectomy with removal of the ovaries and fallopian tubes should be considered once childbearing is completed (if planned).  FAMILY MEMBERS: Since we now know the mutation in Ms. Krull, we can test at-risk relatives to determine whether or not they have inherited the mutation and are at increased risk for cancer.  It is important that all of Ms. Posada relatives (both men and women) know of the presence of this gene mutation. Site-specific genetic testing can sort out who in the family is at risk and who is not. We will be happy to meet with any of the family members or refer them to a genetic counselor in their local area. To locate genetic counselors in other cities, individuals can visit the website of the [employer] ([employer]) and Secretary/administrator for a Social worker by zip code.   Ms. Tallman children have a 50% chance to have inherited this mutation. We recommend they have genetic testing for this same mutation, as identifying the presence of this mutation would allow them to also take advantage of risk-reducing measures. Ms. Verley genetic testing was performed by [facility].  [facility] offers complementary testing to all blood relatives for 90 days after the report date.  We discussed that her family members have until the end of December to undergo genetic testing. Ms. Goin was provided information on how to contact genetic services for her family members who were not in town.  Children who inherit two mutations in the same Lynch gene, one mutation from each parent, are at-risk of a rare recessive condition called constitutional mismatch repair deficiency (CMMR-D) syndrome. If family members have this mutation, they may wish to have their partner tested if they are planning on having children.  PLAN: Ms. Naples will need to be followed as high risk based on her diagnosis of Lynch syndrome.    Ms. Fader has identified a gastroenterologist that she  plans to see for follow up for her diagnosis of Lynch syndrome.  This individual is Dr. Tanna Furry.    Ms. Enslin has been diagnosed with uterine and ovarian cancer.  She will follow up with her oncologist, Dr. Heath Lark, regarding this diagnosis.   Ms. Kindig has a diagnosis of pancreatic cancer.  She was told that they think it may be stage III.  She will undergo surgery for this cancer on Friday, November 25, 2017 at [facility].  Her surgeon is Bailey Mech, MD.  Afterward, she will undergo chemotherapy for her pancreatic cancer.  RESOURCES:  Ms. Maradiaga was given patient information about Lynch syndrome that was written by the national support group "it takes guts".  she was also provided information about the cancer registry program, Vivia Ewing, out  of [employer].  The cancer registry provides a way for researchers and patients to link together.  [employer] also provides a biannual newsletter that provides updated information about hereditary cancer genes and syndromes.  We strongly encouraged Ms. Salminen to remain in contact with [place] in cancer genetics on an annual basis so we can update Ms. Schweers personal and family histories, and inform her of advances in cancer genetics that may be of benefit for the entire family. Ms. Mealor knows she is also welcome to call with any questions or concerns, at any time.   Rea Reser P. Florene Glen, [place], [facility]  Certified [employer]  [phone]

## 2017-11-22 NOTE — Assessment & Plan Note (Signed)
She is currently scheduled for surgery at [facility] on November 25, 2017 for definitive surgery I discussed the plan briefly with the patient Plan to see her approximately 3 weeks from surgery to review pathology report and plan of care.  She will likely be commenced on adjuvant pancreatic cancer regimen next month.

## 2017-11-25 DIAGNOSIS — E669 Obesity, unspecified: Secondary | ICD-10-CM | POA: Diagnosis not present

## 2017-11-25 DIAGNOSIS — Z90710 Acquired absence of both cervix and uterus: Secondary | ICD-10-CM | POA: Diagnosis not present

## 2017-11-25 DIAGNOSIS — M069 Rheumatoid arthritis, unspecified: Secondary | ICD-10-CM | POA: Diagnosis not present

## 2017-11-25 DIAGNOSIS — Z85038 Personal history of other malignant neoplasm of large intestine: Secondary | ICD-10-CM | POA: Diagnosis not present

## 2017-11-25 DIAGNOSIS — Z882 Allergy status to sulfonamides status: Secondary | ICD-10-CM | POA: Diagnosis not present

## 2017-11-25 DIAGNOSIS — Z5181 Encounter for therapeutic drug level monitoring: Secondary | ICD-10-CM | POA: Diagnosis not present

## 2017-11-25 DIAGNOSIS — M1990 Unspecified osteoarthritis, unspecified site: Secondary | ICD-10-CM | POA: Diagnosis not present

## 2017-11-25 DIAGNOSIS — Z79899 Other long term (current) drug therapy: Secondary | ICD-10-CM | POA: Diagnosis not present

## 2017-11-25 DIAGNOSIS — J90 Pleural effusion, not elsewhere classified: Secondary | ICD-10-CM | POA: Diagnosis not present

## 2017-11-25 DIAGNOSIS — G8918 Other acute postprocedural pain: Secondary | ICD-10-CM | POA: Diagnosis not present

## 2017-11-25 DIAGNOSIS — Z9221 Personal history of antineoplastic chemotherapy: Secondary | ICD-10-CM | POA: Diagnosis not present

## 2017-11-25 DIAGNOSIS — R9431 Abnormal electrocardiogram [ECG] [EKG]: Secondary | ICD-10-CM | POA: Diagnosis not present

## 2017-11-25 DIAGNOSIS — Z885 Allergy status to narcotic agent status: Secondary | ICD-10-CM | POA: Diagnosis not present

## 2017-11-25 DIAGNOSIS — K8010 Calculus of gallbladder with chronic cholecystitis without obstruction: Secondary | ICD-10-CM | POA: Diagnosis not present

## 2017-11-25 DIAGNOSIS — G8929 Other chronic pain: Secondary | ICD-10-CM | POA: Diagnosis not present

## 2017-11-25 DIAGNOSIS — C251 Malignant neoplasm of body of pancreas: Secondary | ICD-10-CM | POA: Diagnosis not present

## 2017-11-25 DIAGNOSIS — K824 Cholesterolosis of gallbladder: Secondary | ICD-10-CM | POA: Diagnosis not present

## 2017-11-25 DIAGNOSIS — K219 Gastro-esophageal reflux disease without esophagitis: Secondary | ICD-10-CM | POA: Diagnosis not present

## 2017-11-25 DIAGNOSIS — D649 Anemia, unspecified: Secondary | ICD-10-CM | POA: Diagnosis not present

## 2017-11-25 DIAGNOSIS — R000 Tachycardia, unspecified: Secondary | ICD-10-CM | POA: Diagnosis not present

## 2017-11-25 DIAGNOSIS — G893 Neoplasm related pain (acute) (chronic): Secondary | ICD-10-CM | POA: Diagnosis not present

## 2017-11-25 DIAGNOSIS — Z4682 Encounter for fitting and adjustment of non-vascular catheter: Secondary | ICD-10-CM | POA: Diagnosis not present

## 2017-11-25 DIAGNOSIS — R1110 Vomiting, unspecified: Secondary | ICD-10-CM | POA: Diagnosis not present

## 2017-11-25 DIAGNOSIS — Z880 Allergy status to penicillin: Secondary | ICD-10-CM | POA: Diagnosis not present

## 2017-11-25 DIAGNOSIS — N390 Urinary tract infection, site not specified: Secondary | ICD-10-CM | POA: Diagnosis not present

## 2017-11-25 DIAGNOSIS — Z87891 Personal history of nicotine dependence: Secondary | ICD-10-CM | POA: Diagnosis not present

## 2017-11-25 DIAGNOSIS — K8689 Other specified diseases of pancreas: Secondary | ICD-10-CM | POA: Diagnosis not present

## 2017-11-25 DIAGNOSIS — J9811 Atelectasis: Secondary | ICD-10-CM | POA: Diagnosis not present

## 2017-11-25 DIAGNOSIS — Z1509 Genetic susceptibility to other malignant neoplasm: Secondary | ICD-10-CM | POA: Diagnosis not present

## 2017-11-25 DIAGNOSIS — R0602 Shortness of breath: Secondary | ICD-10-CM | POA: Diagnosis not present

## 2017-11-25 DIAGNOSIS — C252 Malignant neoplasm of tail of pancreas: Secondary | ICD-10-CM | POA: Diagnosis not present

## 2017-11-25 DIAGNOSIS — Z6841 Body Mass Index (BMI) 40.0 and over, adult: Secondary | ICD-10-CM | POA: Diagnosis not present

## 2017-11-25 HISTORY — PX: PANCREATECTOMY: SHX1019

## 2017-11-25 HISTORY — PX: SPLENECTOMY, PARTIAL: SHX787

## 2017-11-26 DIAGNOSIS — Z5181 Encounter for therapeutic drug level monitoring: Secondary | ICD-10-CM | POA: Diagnosis not present

## 2017-11-26 DIAGNOSIS — G8918 Other acute postprocedural pain: Secondary | ICD-10-CM | POA: Diagnosis not present

## 2017-11-26 DIAGNOSIS — Z79899 Other long term (current) drug therapy: Secondary | ICD-10-CM | POA: Diagnosis not present

## 2017-11-26 DIAGNOSIS — R0602 Shortness of breath: Secondary | ICD-10-CM | POA: Diagnosis not present

## 2017-11-27 DIAGNOSIS — R000 Tachycardia, unspecified: Secondary | ICD-10-CM | POA: Diagnosis not present

## 2017-11-27 DIAGNOSIS — C252 Malignant neoplasm of tail of pancreas: Secondary | ICD-10-CM | POA: Diagnosis not present

## 2017-11-27 DIAGNOSIS — Z79899 Other long term (current) drug therapy: Secondary | ICD-10-CM | POA: Diagnosis not present

## 2017-11-27 DIAGNOSIS — G8918 Other acute postprocedural pain: Secondary | ICD-10-CM | POA: Diagnosis not present

## 2017-11-27 DIAGNOSIS — R9431 Abnormal electrocardiogram [ECG] [EKG]: Secondary | ICD-10-CM | POA: Diagnosis not present

## 2017-11-27 DIAGNOSIS — Z5181 Encounter for therapeutic drug level monitoring: Secondary | ICD-10-CM | POA: Diagnosis not present

## 2017-11-28 DIAGNOSIS — Z79899 Other long term (current) drug therapy: Secondary | ICD-10-CM | POA: Diagnosis not present

## 2017-11-28 DIAGNOSIS — G8918 Other acute postprocedural pain: Secondary | ICD-10-CM | POA: Diagnosis not present

## 2017-11-28 DIAGNOSIS — J9811 Atelectasis: Secondary | ICD-10-CM | POA: Diagnosis not present

## 2017-11-28 DIAGNOSIS — Z5181 Encounter for therapeutic drug level monitoring: Secondary | ICD-10-CM | POA: Diagnosis not present

## 2017-11-28 DIAGNOSIS — J90 Pleural effusion, not elsewhere classified: Secondary | ICD-10-CM | POA: Diagnosis not present

## 2017-11-29 DIAGNOSIS — Z5181 Encounter for therapeutic drug level monitoring: Secondary | ICD-10-CM | POA: Diagnosis not present

## 2017-11-29 DIAGNOSIS — G8918 Other acute postprocedural pain: Secondary | ICD-10-CM | POA: Diagnosis not present

## 2017-11-29 DIAGNOSIS — Z79899 Other long term (current) drug therapy: Secondary | ICD-10-CM | POA: Diagnosis not present

## 2017-11-29 DIAGNOSIS — R9431 Abnormal electrocardiogram [ECG] [EKG]: Secondary | ICD-10-CM | POA: Diagnosis not present

## 2017-11-29 DIAGNOSIS — R000 Tachycardia, unspecified: Secondary | ICD-10-CM | POA: Diagnosis not present

## 2017-11-30 DIAGNOSIS — C252 Malignant neoplasm of tail of pancreas: Secondary | ICD-10-CM | POA: Diagnosis not present

## 2017-11-30 DIAGNOSIS — R1110 Vomiting, unspecified: Secondary | ICD-10-CM | POA: Diagnosis not present

## 2017-11-30 DIAGNOSIS — Z4682 Encounter for fitting and adjustment of non-vascular catheter: Secondary | ICD-10-CM | POA: Diagnosis not present

## 2017-12-02 DIAGNOSIS — C252 Malignant neoplasm of tail of pancreas: Secondary | ICD-10-CM | POA: Diagnosis not present

## 2017-12-04 DIAGNOSIS — Z800 Family history of malignant neoplasm of digestive organs: Secondary | ICD-10-CM | POA: Insufficient documentation

## 2017-12-15 ENCOUNTER — Inpatient Hospital Stay: Payer: BLUE CROSS/BLUE SHIELD | Attending: Hematology and Oncology | Admitting: Hematology and Oncology

## 2017-12-15 ENCOUNTER — Encounter: Payer: Self-pay | Admitting: Hematology and Oncology

## 2017-12-15 ENCOUNTER — Other Ambulatory Visit: Payer: Self-pay | Admitting: Hematology and Oncology

## 2017-12-15 ENCOUNTER — Inpatient Hospital Stay: Payer: BLUE CROSS/BLUE SHIELD

## 2017-12-15 ENCOUNTER — Telehealth: Payer: Self-pay | Admitting: Hematology and Oncology

## 2017-12-15 VITALS — BP 141/95 | HR 107 | Temp 98.5°F | Resp 18 | Ht 62.0 in | Wt 216.6 lb

## 2017-12-15 DIAGNOSIS — Z9081 Acquired absence of spleen: Secondary | ICD-10-CM | POA: Diagnosis not present

## 2017-12-15 DIAGNOSIS — C252 Malignant neoplasm of tail of pancreas: Secondary | ICD-10-CM | POA: Diagnosis not present

## 2017-12-15 DIAGNOSIS — T451X5A Adverse effect of antineoplastic and immunosuppressive drugs, initial encounter: Secondary | ICD-10-CM

## 2017-12-15 DIAGNOSIS — C259 Malignant neoplasm of pancreas, unspecified: Secondary | ICD-10-CM

## 2017-12-15 DIAGNOSIS — D6481 Anemia due to antineoplastic chemotherapy: Secondary | ICD-10-CM

## 2017-12-15 DIAGNOSIS — D500 Iron deficiency anemia secondary to blood loss (chronic): Secondary | ICD-10-CM

## 2017-12-15 DIAGNOSIS — D509 Iron deficiency anemia, unspecified: Secondary | ICD-10-CM | POA: Insufficient documentation

## 2017-12-15 DIAGNOSIS — M0609 Rheumatoid arthritis without rheumatoid factor, multiple sites: Secondary | ICD-10-CM | POA: Diagnosis not present

## 2017-12-15 DIAGNOSIS — C7961 Secondary malignant neoplasm of right ovary: Secondary | ICD-10-CM

## 2017-12-15 DIAGNOSIS — C541 Malignant neoplasm of endometrium: Secondary | ICD-10-CM

## 2017-12-15 LAB — COMPREHENSIVE METABOLIC PANEL
ALT: 27 U/L (ref 0–44)
AST: 21 U/L (ref 15–41)
Albumin: 3.7 g/dL (ref 3.5–5.0)
Alkaline Phosphatase: 65 U/L (ref 38–126)
Anion gap: 15 (ref 5–15)
BUN: 14 mg/dL (ref 6–20)
CO2: 25 mmol/L (ref 22–32)
Calcium: 10 mg/dL (ref 8.9–10.3)
Chloride: 101 mmol/L (ref 98–111)
Creatinine, Ser: 0.72 mg/dL (ref 0.44–1.00)
GFR calc Af Amer: >60 mL/min (ref >60)
GFR calc non Af Amer: >60 mL/min (ref >60)
Glucose, Bld: 137 mg/dL — ABNORMAL HIGH (ref 70–99)
Potassium: 3.6 mmol/L (ref 3.5–5.1)
Sodium: 141 mmol/L (ref 135–145)
Total Bilirubin: 0.2 mg/dL — ABNORMAL LOW (ref 0.3–1.2)
Total Protein: 8.1 g/dL (ref 6.5–8.1)

## 2017-12-15 LAB — CBC WITH DIFFERENTIAL/PLATELET
Abs Immature Granulocytes: 0.06 10*3/uL (ref 0.00–0.07)
Basophils Absolute: 0.1 10*3/uL (ref 0.0–0.1)
Basophils Relative: 1 %
Eosinophils Absolute: 0.3 10*3/uL (ref 0.0–0.5)
Eosinophils Relative: 3 %
HCT: 36.1 % (ref 36.0–46.0)
Hemoglobin: 10.6 g/dL — ABNORMAL LOW (ref 12.0–15.0)
Immature Granulocytes: 1 %
Lymphocytes Relative: 17 %
Lymphs Abs: 1.9 10*3/uL (ref 0.7–4.0)
MCH: 27.6 pg (ref 26.0–34.0)
MCHC: 29.4 g/dL — ABNORMAL LOW (ref 30.0–36.0)
MCV: 94 fL (ref 80.0–100.0)
Monocytes Absolute: 1 10*3/uL (ref 0.1–1.0)
Monocytes Relative: 9 %
Neutro Abs: 7.9 10*3/uL — ABNORMAL HIGH (ref 1.7–7.7)
Neutrophils Relative %: 69 %
Platelets: 806 10*3/uL — ABNORMAL HIGH (ref 150–400)
RBC: 3.84 MIL/uL — ABNORMAL LOW (ref 3.87–5.11)
RDW: 19.1 % — ABNORMAL HIGH (ref 11.5–15.5)
WBC: 11.2 10*3/uL — ABNORMAL HIGH (ref 4.0–10.5)
nRBC: 0 % (ref 0.0–0.2)

## 2017-12-15 LAB — IRON AND TIBC
Iron: 19 ug/dL — ABNORMAL LOW (ref 41–142)
Saturation Ratios: 6 % — ABNORMAL LOW (ref 21–57)
TIBC: 326 ug/dL (ref 236–444)
UIBC: 307 ug/dL

## 2017-12-15 LAB — SAMPLE TO BLOOD BANK

## 2017-12-15 LAB — MAGNESIUM: Magnesium: 1.9 mg/dL (ref 1.7–2.4)

## 2017-12-15 LAB — FERRITIN: Ferritin: 259 ng/mL (ref 11–307)

## 2017-12-15 NOTE — Progress Notes (Signed)
START ON PATHWAY REGIMEN - Pancreatic     A cycle is every 14 days:     Oxaliplatin      Leucovorin      Irinotecan      5-Fluorouracil   **Always confirm dose/schedule in your pharmacy ordering system**  Patient Characteristics: Adenocarcinoma, Resectable, Adjuvant, Negative Margins (Includes Positive Lymph Nodes) Histology: Adenocarcinoma Current evidence of distant metastases<= No AJCC T Category: T1b AJCC N Category: N0 AJCC M Category: M0 AJCC 8 Stage Grouping: IA Treatment Setting: Adjuvant Intent of Therapy: Curative Intent, Discussed with Patient

## 2017-12-15 NOTE — Progress Notes (Signed)
[place] OFFICE PROGRESS NOTE  Patient Care Team: [name], MD as PCP - General (Family Medicine)  ASSESSMENT & PLAN:  Pancreatic cancer [facility]) She is recovering well from surgery. She has appointment to see her surgeon on December 29, 2017. I recommend we start her treatment of following week to allow adequate wound healing I reviewed the pathology result with the patient She would need adjuvant chemotherapy I recommend treatment with modified FOLFIRINOX therapy I will omit bolus 5-FU and reduce a dose of irinotecan She will receive G-CSF support on day 3 of treatment I plan to repeat CT imaging studies in December for further follow-up The risks, benefits, side effects of FOLFIRINOX treatment including risk of pancytopenia, infection, mucositis, diarrhea, neuropathy and others were discussed and she agreed to proceed with the plan of care She is aware I will not be here next month My colleague will see her while I am away and I will be back in December to resume care  Iron deficiency anemia She has multifactorial anemia, could be due to recent surgery causing blood loss and anemia chronic illness I recommend multivitamin supplement She does not need blood transfusion  Secondary malignant neoplasm of right ovary ([place]) She has received 3 cycles of adjuvant carboplatin and Taxol with no evidence of disease on recent CT imaging I will put further treatment on hold for now and will complete adjuvant treatment for pancreatic cancer She has appointment to see GYN oncologist next month for further follow-up Recent tumor marker is stable  Rheumatoid arthritis of multiple sites with negative rheumatoid factor ([place]) She has significant flare of arthritis pain recently She will continue conservative management with pain medicine as needed along with anti-inflammatory medications  S/P splenectomy She has received recent vaccination postsplenectomy Her elevated white  blood cell count and platelet count are readily reflective of postop changes   Orders Placed This Encounter  Procedures  . Magnesium    Standing Status:   Standing    Number of Occurrences:   22    Standing Expiration Date:   12/16/2018  . Iron and TIBC    Standing Status:   Future    Number of Occurrences:   1    Standing Expiration Date:   01/19/2019  . Ferritin    Standing Status:   Future    Number of Occurrences:   1    Standing Expiration Date:   01/19/2019  . CA 19.9    Standing Status:   Future    Number of Occurrences:   1    Standing Expiration Date:   12/15/2018    INTERVAL HISTORY: Please see below for problem oriented charting. She returns to review recent test results from surgical resection She is recovering well from surgery She complained of excessive fatigue She also had a flare of recent arthritis With narcotic prescription, she developed constipation but subsequently resolved The patient denies any recent signs or symptoms of bleeding such as spontaneous epistaxis, hematuria or hematochezia. 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 Mixed attenuation mass emanates from the right adnexa measuring 10.8 x 8.0 cm very suspicious for right ovarian carcinoma. 2. Abnormality of the tail of the pancreas may be due to mild pancreatitis and  small pseudocyst formation, but neoplasm cannot be excluded. 3. Small amount of ascites within abdomen and pelvis. 4. Small nonobstructing renal calculi bilaterally.     08/30/2017 Pathology Results    1. Uterus and cervix, with left fallopian tube - ENDOMETRIOID ADENOCARCINOMA, FIGO GRADE I, ARISING IN A BACKGROUND OF DIFFUSE COMPLEX ATYPICAL HYPERPLASIA. - CARCINOMA  INVADES FOR OF DEPTH OF 0.2 CM WHERE THICKNESS OF MYOMETRIAL WALL IS 2.1 CM. - ALL RESECTION MARGINS ARE NEGATIVE FOR CARCINOMA. - NEGATIVE FOR LYMPHOVASCULAR OR PERINEURAL INVASION. - CERVICAL STROMA IS NOT INVOLVED. - SEE ONCOLOGY TABLE. - SEE NOTE 2. Ovary and fallopian tube, right - PRIMARY OVARIAN ENDOMETRIOID ADENOCARCINOMA, FIGO GRADE II, 12 CM. - THE OVARIAN SURFACE IS FOCALLY INVOLVED BY CARCINOMA. - NEGATIVE FOR LYMPHOVASCULAR INVASION. - BENIGN UNREMARKABLE FALLOPIAN TUBE, NEGATIVE FOR CARCINOMA. - SEE ONCOLOGY TABLE. - SEE NOTE 3. Cul-de-sac biopsy - METASTATIC ADENOCARCINOMA, MOST CONSISTENT WITH PRIMARY OVARIAN ENDOMETRIOID ADENOCARCINOMA. 4. Ovary, left - BENIGN UNREMARKABLE OVARY, NEGATIVE FOR MALIGNANCY. Microscopic Comment 1. UTERUS, CARCINOMA OR CARCINOSARCOMA Procedure: Total hysterectomy with bilateral salpingo-oophorectomy. Histologic type: Endometrioid adenocarcinoma. Histologic Grade: FIGO Grade I Myometrial invasion: Depth of invasion: 2 mm Myometrial thickness: 21 mm Uterine Serosa Involvement: Not identified Cervical stromal involvement: Not identified Extent of involvement of other organs: Not applicable Lymphovascular invasion: Not identified Regional Lymph Nodes: Examined: 0 Sentinel 0 Non-sentinel 0 Total Tumor block for ancillary studies: 1H MMR / MSI testing: Pending Pathologic Stage Classification (pTNM, AJCC 8th edition): pT1a, pNX (v4.1.0.0) 2. OVARY or FALLOPIAN TUBE or PRIMARY PERITONEUM: Procedure: Salpingo-oophorectomy Specimen Integrity: Intact Tumor Site: Right ovary Ovarian Surface Involvement (required only if applicable): Focally involved by carcinoma Fallopian Tube Surface Involvement (required only if applicable): Not identified Tumor Size: 12 cm Histologic Type: Endometrioid adenocarcinoma Histologic Grade: Grade II Implants (required for advanced stage serous/seromucinous borderline tumors only): Not applicable Other  Tissue/ Organ Involvement: Cul de sac biopsy involved by tumor Largest Extrapelvic Peritoneal Focus (required only if applicable): Not applicable Peritoneal/Ascitic Fluid: Negative for carcinoma (case # [id number]) Treatment Effect (required only for high-grade serous carcinomas): Not applicable Regional Lymph Nodes: No lymph nodes submitted or found Number of Lymph Nodes Examined: 0 Pathologic Stage Classification (pTNM, AJCC 8th Edition): pT2b, pN0 Representative Tumor Block: 2B and 2E 1. Molecular study for microsatellite instability and immunohistochemical stains for MMR-related proteins are pending and will be reported in an addendum. 2. Immunohistochemical stain show that the ovarian tumor is positive for CK7 and PAX8 (both diffuse), CDX2 (patchy and weak); and negative for CK20. This immunoprofile is consistent with the above diagnosis. Dr. Lyndon Code has reviewed this case and concurs with the above diagnosis. Molecular study for microsatellite instability and immunohistochemical stains for MMR-related proteins are pending and will be reported in an addendum    08/30/2017 Genetic Testing    Patient has genetic testing done for MSI  Results revealed patient has the following mutation(s): loss of [facility] 2    08/30/2017 Surgery    Surgeon: Mart Piggs, MD Pre-operative Diagnosis:  1. Adnexal mass 2. Abnormal uterine bleeding 3. H/o Cecal CA  Post-operative Diagnosis:  1. Adhesive disease post colon resection 2. Endometrial cancer NOS 3. Adenocarcinoma unknown origin, right ovary, suspicious for GI primary  Operation:  1. Lysis of adhesions ~30 minutes 2. Robotic-assisted laparoscopic total hysterectomy with right salpingo-oophorectomy and left salpingectomy 3. Left oophorectomy (RA-laparoscopic) 4. Pelvic washings  Findings: Adhesions of omentum to anterior abdominal wall. Enlarged cystic right ovary ~10cm. Uterus had small nodules on serosa near  where right adnexa was intimate  with the surface. No obvious intraoperative rupture of cyst, although in 2 areas the wall was thin and one of these areas had some bleeding. Slight scarring of left bladder dome to LUS/cervix. Uterus on frozen section c/w hyperplasia and a small focus of endometrial CA - no myo invasion, <2cm in size. Frozen section on the right adnexa was carcinoma, met from colon or possibly Gyn, favor GI primary, defer to permanent. Left ovary was WNL.      09/15/2017 Cancer Staging    Staging form: Corpus Uteri - Carcinoma and Carcinosarcoma, AJCC 8th Edition - Pathologic: FIGO Stage IA (pT1a, pN0, cM0) - Signed by Heath Lark, MD on 09/15/2017    09/26/2017 Procedure    Successful placement of a right internal jugular approach power injectable Port-A-Cath. The catheter is ready for immediate use.    11/07/2017 Imaging    1. Since 08/18/2017, similar to slight decrease in size of a pancreatic body/tail junction lesion. Cross modality comparison relative to 09/16/2017 MRI is also grossly similar. 2. No evidence of metastatic disease. 3. Aortic Atherosclerosis (ICD10-I70.0).  4. Left nephrolithiasis.    11/18/2017 Genetic Testing    MSH2 c.2237dupT pathogenic mutation identified in the CancerNext panel.  The CancerNext gene panel offered by [employer] includes sequencing and rearrangement analysis for the following 34 genes:   APC, ATM, BARD1, BMPR1A, BRCA1, BRCA2, BRIP1, CDH1, CDK4, CDKN2A, CHEK2, DICER1, HOXB13, EPCAM, GREM1, MLH1, MRE11A, MSH2, MSH6, MUTYH, NBN, NF1, PALB2, PMS2, POLD1, POLE, PTEN, RAD50, RAD51C, RAD51D, SMAD4, SMARCA4, STK11, and TP53.  The report date is November 18, 2017.  MSH2 [id number] pathogenic mutation identified on somatic testing.  These results are consistent with a diagnosis of Lynch syndrome.    12/21/2017 -  Chemotherapy    The patient had palonosetron (ALOXI) injection 0.25 mg, 0.25 mg, Intravenous,  Once, 0 of 6 cycles irinotecan (CAMPTOSAR) 320 mg in  dextrose 5 % 500 mL chemo infusion, 150 mg/m2 = 320 mg (100 % of original dose 150 mg/m2), Intravenous,  Once, 0 of 6 cycles Dose modification: 150 mg/m2 (original dose 150 mg/m2, Cycle 1, Reason: Provider Judgment) leucovorin 828 mg in dextrose 5 % 250 mL infusion, 400 mg/m2 = 828 mg, Intravenous,  Once, 0 of 6 cycles oxaliplatin (ELOXATIN) 175 mg in dextrose 5 % 500 mL chemo infusion, 85 mg/m2 = 175 mg, Intravenous,  Once, 0 of 6 cycles fluorouracil (ADRUCIL) 4,950 mg in sodium chloride 0.9 % 51 mL chemo infusion, 2,400 mg/m2 = 4,950 mg, Intravenous, 1 Day/Dose, 0 of 6 cycles  for chemotherapy treatment.      Secondary malignant neoplasm of right ovary ([place])   08/23/2017 Tumor Marker    Patient's tumor was tested for the following markers: CA-125 Results of the tumor marker test revealed 139.8    08/31/2017 Initial Diagnosis    Secondary malignant neoplasm of right ovary ([place])    09/15/2017 Cancer Staging    Staging form: Ovary, Fallopian Tube, and Primary Peritoneal Carcinoma, AJCC 8th Edition - Pathologic: Stage IIB (pT2b, pN0, cM0) - Signed by Heath Lark, MD on 09/15/2017    09/27/2017 Imaging    No evidence of metastatic disease or other acute findings within the thorax.  4 cm low-attenuation mass in pancreatic tail, highly suspicious for pancreatic carcinoma. This is caused splenic vein thrombosis, with new venous collaterals in the left upper quadrant. Consider endoscopic ultrasound with FNA for tissue diagnosis.  Stable benign hepatic hemangioma.     09/27/2017 Tumor  Marker    Patient's tumor was tested for the following markers: CA-125 Results of the tumor marker test revealed 39.4    11/02/2017 Tumor Marker    Patient's tumor was tested for the following markers: CA-125 Results of the tumor marker test revealed 19    11/07/2017 Tumor Marker    Patient's tumor was tested for the following markers: CA-125 Results of the tumor marker test revealed 18.3    11/18/2017 Genetic  Testing    MSH2 c.2237dupT pathogenic mutation identified in the CancerNext panel.  The CancerNext gene panel offered by [employer] includes sequencing and rearrangement analysis for the following 34 genes:   APC, ATM, BARD1, BMPR1A, BRCA1, BRCA2, BRIP1, CDH1, CDK4, CDKN2A, CHEK2, DICER1, HOXB13, EPCAM, GREM1, MLH1, MRE11A, MSH2, MSH6, MUTYH, NBN, NF1, PALB2, PMS2, POLD1, POLE, PTEN, RAD50, RAD51C, RAD51D, SMAD4, SMARCA4, STK11, and TP53.  The report date is November 18, 2017.  MSH2 [id number] pathogenic mutation identified on somatic testing.  These results are consistent with a diagnosis of Lynch syndrome.    12/21/2017 -  Chemotherapy    The patient had palonosetron (ALOXI) injection 0.25 mg, 0.25 mg, Intravenous,  Once, 0 of 6 cycles irinotecan (CAMPTOSAR) 320 mg in dextrose 5 % 500 mL chemo infusion, 150 mg/m2 = 320 mg (100 % of original dose 150 mg/m2), Intravenous,  Once, 0 of 6 cycles Dose modification: 150 mg/m2 (original dose 150 mg/m2, Cycle 1, Reason: Provider Judgment) leucovorin 828 mg in dextrose 5 % 250 mL infusion, 400 mg/m2 = 828 mg, Intravenous,  Once, 0 of 6 cycles oxaliplatin (ELOXATIN) 175 mg in dextrose 5 % 500 mL chemo infusion, 85 mg/m2 = 175 mg, Intravenous,  Once, 0 of 6 cycles fluorouracil (ADRUCIL) 4,950 mg in sodium chloride 0.9 % 51 mL chemo infusion, 2,400 mg/m2 = 4,950 mg, Intravenous, 1 Day/Dose, 0 of 6 cycles  for chemotherapy treatment.      Pancreatic cancer ([place])   10/13/2017 Pathology Results    Pancreas tail mass, endoscopic ultrasound-guided, fine needle aspiration II (smears and cell block): Adenocarcinoma    11/18/2017 Genetic Testing    MSH2 c.2237dupT pathogenic mutation identified in the CancerNext panel.  The CancerNext gene panel offered by [employer] includes sequencing and rearrangement analysis for the following 34 genes:   APC, ATM, BARD1, BMPR1A, BRCA1, BRCA2, BRIP1, CDH1, CDK4, CDKN2A, CHEK2, DICER1, HOXB13, EPCAM,  GREM1, MLH1, MRE11A, MSH2, MSH6, MUTYH, NBN, NF1, PALB2, PMS2, POLD1, POLE, PTEN, RAD50, RAD51C, RAD51D, SMAD4, SMARCA4, STK11, and TP53.  The report date is November 18, 2017.  MSH2 [id number] pathogenic mutation identified on somatic testing.  These results are consistent with a diagnosis of Lynch syndrome.    11/24/2017 Pathology Results    A. "TAIL OF PANCREAS AND SPLEEN", DISTAL PANCREATECTOMY AND SPLENECTOMY: Invasive ductal adenocarcinoma, moderately to poorly differentiated with focal signet ring cell features, of pancreas (distal).   The carcinoma is 2.5 cm in greatest dimension grossly.   Treatment effects present in the form of fibrosis (50%). No lymphovascular or definite perineural invasion identified. All surgical margins are negative for tumor or high-grade dysplasia. Adjacent mucinous neoplasm, most consistent with Intraductal papillary mucinous neoplasm (IPMN) with low-grade dysplasia.   Uninvolved pancreas show atrophy and focal acute inflammation.   Twelve benign lymph nodes (0/12). Spleen with no significant histopathologic abnormalities.  PROCEDURE: distal pancreatectomy and splenectomy TUMOR SITE: distal pancreas TUMOR SIZE:  GREATEST DIMENSION: 2.5 cm  ADDITIONAL DIMENSIONS:  x  cm HISTOLOGIC TYPE: ductal adenocarcinoma HISTOLOGIC GRADE: grade 3 TUMOR EXTENSION:  peripancreatic soft tissue MARGINS: negative for tumor TREATMENT EFFECT: treatment effects present in the form of fibrosis (50%). LYMPHOVASCULAR INVASION: not identified PERINEURAL INVASION: no definite evidence REGIONAL LYMPH NODES:   NUMBER OF LYMPH NODES INVOLVED: 0   NUMBER OF LYMPH NODES EXAMINED: 12 PATHOLOGIC STAGE CLASSIFICATION (pTNM, AJCC 8th Ed): ypT2, ypN0 DISTANT METASTASIS (pM): pMx ADDITIONAL PATHOLOGIC FINDINGS: mucinous neoplasm, most consistent with intraductal papillary mucinous neoplasm (IPMN) with low-grade  dysplasia, is identified adjacent to the main tumor.    11/25/2017 Surgery    She had surgery at [facility] 1. Exploratory Laparotomy 2. Distal Pancreatectomy and Splenectomy 3. Intraoperative Ultrasound 4. Open Cholecystectomy     12/15/2017 Cancer Staging    Staging form: Exocrine Pancreas, AJCC 8th Edition - Clinical: Stage IB (cT2, cN0, cM0) - Signed by Heath Lark, MD on 12/15/2017     REVIEW OF SYSTEMS:   Constitutional: Denies fevers, chills or abnormal weight loss Eyes: Denies blurriness of vision Ears, nose, mouth, throat, and face: Denies mucositis or sore throat Respiratory: Denies cough, dyspnea or wheezes Cardiovascular: Denies palpitation, chest discomfort or lower extremity swelling Skin: Denies abnormal skin rashes Lymphatics: Denies new lymphadenopathy or easy bruising Behavioral/Psych: Mood is stable, no new changes  All other systems were reviewed with the patient and are negative.  I have reviewed the past medical history, past surgical history, social history and family history with the patient and they are unchanged from previous note.  ALLERGIES:  is allergic to codeine; penicillins; and bactrim [sulfamethoxazole-trimethoprim].  MEDICATIONS:  Current Outpatient Medications  Medication Sig Dispense Refill  . acetaminophen (TYLENOL) 650 MG CR tablet Take 1,300 mg by mouth every 8 (eight) hours.    Marland Kitchen HYDROmorphone (DILAUDID) 4 MG tablet Take 1 tablet (4 mg total) by mouth every 4 (four) hours as needed for severe pain. 30 tablet 0  . lidocaine-prilocaine (EMLA) cream Apply to affected area once (Patient taking differently: Apply 1 application topically daily as needed (port). Apply to affected area once) 30 g 3  . loratadine (CLARITIN) 10 MG tablet Take 10 mg by mouth daily after breakfast.    . omeprazole (PRILOSEC) 20 MG capsule Take 20 mg by mouth 2 (two) times daily before a meal.     . ondansetron (ZOFRAN) 8 MG tablet Take 1 tablet (8 mg total) by mouth  every 8 (eight) hours as needed for refractory nausea / vomiting. Start on day 3 after chemo. 30 tablet 1  . predniSONE (DELTASONE) 10 MG tablet TAKE 1 TABLET BY MOUTH EVERY DAY WITH BREAKFAST (Patient taking differently: Take 5 mg by mouth daily with breakfast. ) 30 tablet 0  . prochlorperazine (COMPAZINE) 10 MG tablet Take 1 tablet (10 mg total) by mouth every 6 (six) hours as needed (Nausea or vomiting). 30 tablet 1  . simethicone (MYLICON) 403 MG chewable tablet Chew 125 mg by mouth every 6 (six) hours as needed for flatulence.    . traMADol (ULTRAM) 50 MG tablet Take 2 tablets (100 mg total) by mouth every 6 (six) hours as needed. 90 tablet 0   No current facility-administered medications for this visit.     PHYSICAL EXAMINATION: ECOG PERFORMANCE STATUS: 1 - Symptomatic but completely ambulatory  Vitals:   12/15/17 0946  BP: (!) 141/95  Pulse: (!) 107  Resp: 18  Temp: 98.5 F (36.9 C)  SpO2: 97%   Filed Weights   12/15/17 0946  Weight: 216 lb 9.6 oz (98.2 kg)    GENERAL:alert, no distress and comfortable SKIN:  skin color, texture, turgor are normal, no rashes or significant lesions EYES: normal, Conjunctiva are pink and non-injected, sclera clear OROPHARYNX:no exudate, no erythema and lips, buccal mucosa, and tongue normal  NECK: supple, thyroid normal size, non-tender, without nodularity LYMPH:  no palpable lymphadenopathy in the cervical, axillary or inguinal LUNGS: clear to auscultation and percussion with normal breathing effort HEART: regular rate & rhythm and no murmurs and no lower extremity edema ABDOMEN:abdomen soft, non-tender and normal bowel sounds.  Well-healed surgical scars Musculoskeletal:no cyanosis of digits and no clubbing  NEURO: alert & oriented x 3 with fluent speech, no focal motor/sensory deficits  LABORATORY DATA:  I have reviewed the data as listed    Component Value Date/Time   NA 141 12/15/2017 1055   K 3.6 12/15/2017 1055   CL 101  12/15/2017 1055   CO2 25 12/15/2017 1055   GLUCOSE 137 (H) 12/15/2017 1055   BUN 14 12/15/2017 1055   CREATININE 0.72 12/15/2017 1055   CREATININE 0.65 11/11/2017 1142   CREATININE 0.68 07/27/2017 0851   CALCIUM 10.0 12/15/2017 1055   PROT 8.1 12/15/2017 1055   ALBUMIN 3.7 12/15/2017 1055   AST 21 12/15/2017 1055   AST 9 (L) 11/02/2017 1057   ALT 27 12/15/2017 1055   ALT 15 11/02/2017 1057   ALKPHOS 65 12/15/2017 1055   BILITOT 0.2 (L) 12/15/2017 1055   BILITOT 0.3 11/02/2017 1057   GFRNONAA >60 12/15/2017 1055   GFRNONAA >60 11/11/2017 1142   GFRNONAA 106 07/27/2017 0851   GFRAA >60 12/15/2017 1055   GFRAA >60 11/11/2017 1142   GFRAA 123 07/27/2017 0851    No results found for: SPEP, UPEP  Lab Results  Component Value Date   WBC 11.2 (H) 12/15/2017   NEUTROABS 7.9 (H) 12/15/2017   HGB 10.6 (L) 12/15/2017   HCT 36.1 12/15/2017   MCV 94.0 12/15/2017   PLT 806 (H) 12/15/2017      Chemistry      Component Value Date/Time   NA 141 12/15/2017 1055   K 3.6 12/15/2017 1055   CL 101 12/15/2017 1055   CO2 25 12/15/2017 1055   BUN 14 12/15/2017 1055   CREATININE 0.72 12/15/2017 1055   CREATININE 0.65 11/11/2017 1142   CREATININE 0.68 07/27/2017 0851      Component Value Date/Time   CALCIUM 10.0 12/15/2017 1055   ALKPHOS 65 12/15/2017 1055   AST 21 12/15/2017 1055   AST 9 (L) 11/02/2017 1057   ALT 27 12/15/2017 1055   ALT 15 11/02/2017 1057   BILITOT 0.2 (L) 12/15/2017 1055   BILITOT 0.3 11/02/2017 1057      I have reviewed recent pathology report from [facility] with the patient and family   All questions were answered. The patient knows to call the clinic with any problems, questions or concerns. No barriers to learning was detected.  I spent 30 minutes counseling the patient face to face. The total time spent in the appointment was 40 minutes and more than 50% was on counseling and review of test results  Heath Lark, MD 12/15/2017  3:06 PM

## 2017-12-15 NOTE — Assessment & Plan Note (Signed)
She has significant flare of arthritis pain recently She will continue conservative management with pain medicine as needed along with anti-inflammatory medications

## 2017-12-15 NOTE — Assessment & Plan Note (Signed)
She has received recent vaccination postsplenectomy Her elevated white blood cell count and platelet count are readily reflective of postop changes

## 2017-12-15 NOTE — Assessment & Plan Note (Signed)
She is recovering well from surgery. She has appointment to see her surgeon on December 29, 2017. I recommend we start her treatment of following week to allow adequate wound healing I reviewed the pathology result with the patient She would need adjuvant chemotherapy I recommend treatment with modified FOLFIRINOX therapy I will omit bolus 5-FU and reduce a dose of irinotecan She will receive G-CSF support on day 3 of treatment I plan to repeat CT imaging studies in December for further follow-up The risks, benefits, side effects of FOLFIRINOX treatment including risk of pancytopenia, infection, mucositis, diarrhea, neuropathy and others were discussed and she agreed to proceed with the plan of care She is aware I will not be here next month My colleague will see her while I am away and I will be back in December to resume care

## 2017-12-15 NOTE — Assessment & Plan Note (Signed)
She has received 3 cycles of adjuvant carboplatin and Taxol with no evidence of disease on recent CT imaging I will put further treatment on hold for now and will complete adjuvant treatment for pancreatic cancer She has appointment to see GYN oncologist next month for further follow-up Recent tumor marker is stable

## 2017-12-15 NOTE — Assessment & Plan Note (Signed)
She has multifactorial anemia, could be due to recent surgery causing blood loss and anemia chronic illness I recommend multivitamin supplement She does not need blood transfusion

## 2017-12-15 NOTE — Telephone Encounter (Signed)
Gave avs and calendar ° °

## 2017-12-16 ENCOUNTER — Other Ambulatory Visit: Payer: Self-pay | Admitting: Hematology and Oncology

## 2017-12-16 ENCOUNTER — Telehealth: Payer: Self-pay

## 2017-12-16 DIAGNOSIS — C7961 Secondary malignant neoplasm of right ovary: Secondary | ICD-10-CM

## 2017-12-16 DIAGNOSIS — C259 Malignant neoplasm of pancreas, unspecified: Secondary | ICD-10-CM

## 2017-12-16 DIAGNOSIS — C541 Malignant neoplasm of endometrium: Secondary | ICD-10-CM

## 2017-12-16 LAB — CANCER ANTIGEN 19-9: CA 19-9: 5 U/mL (ref 0–35)

## 2017-12-16 LAB — CA 125: Cancer Antigen (CA) 125: 57.3 U/mL — ABNORMAL HIGH (ref 0.0–38.1)

## 2017-12-16 NOTE — Telephone Encounter (Signed)
-----   Message from Heath Lark, MD sent at 12/16/2017  1:52 PM EDT ----- Regarding: CA-125 Her level is up but could be due to post op changes. Just to be sure, I will be ordering a repeat CT abdomen first week of November and Dr. Burr Medico can review the test result with her  Krista Blue, can you please follow? Thanks

## 2017-12-16 NOTE — Telephone Encounter (Signed)
Called and given below message. She verbalized understanding. 

## 2017-12-19 NOTE — Progress Notes (Signed)
Office Visit Note  Patient: Dana Hicks             Date of Birth: Oct 30, 1975           MRN: [id number]             PCP: [name], MD Referring: [name], MD Visit Date: 12/26/2017 Occupation: '@GUAROCC' @  Subjective:  Pain in both knees.   History of Present Illness: Dana Hicks is a 42 y.o. female history of seronegative rheumatoid arthritis, osteoarthritis and.  She states she had partial pancreatectomy and splenectomy about a month ago.  She has been off chemotherapy for 2 months now.  She has been experiencing increased pain and discomfort in her knee joints for the last 2 months.  Since she has been off chemotherapy she has been experiencing some discomfort in her other joints.  She denies any joint swelling.  She is having difficulty walking.  She has been using a walker and also cane.  She had to reduce the dose of prednisone to 5 mg because of recent surgery.  She is a starting chemotherapy on January 02, 2018.  Activities of Daily Living:  Patient reports morning stiffness for 24 hours.   Patient Reports nocturnal pain.  Difficulty dressing/grooming: Reports Difficulty climbing stairs: Reports Difficulty getting out of chair: Reports Difficulty using hands for taps, buttons, cutlery, and/or writing: Denies  Review of Systems  Constitutional: Positive for fatigue.  HENT: Negative for mouth sores, trouble swallowing, trouble swallowing and mouth dryness.   Eyes: Negative for pain, redness, itching and dryness.  Respiratory: Positive for shortness of breath. Negative for wheezing.        With activity   Cardiovascular: Negative for chest pain, palpitations and swelling in legs/feet.  Gastrointestinal: Negative for abdominal pain, constipation, diarrhea, nausea and vomiting.  Endocrine: Negative for increased urination.  Genitourinary: Negative for painful urination, nocturia and pelvic pain.  Musculoskeletal: Positive for arthralgias, joint pain and  morning stiffness. Negative for joint swelling.  Skin: Negative for rash.  Allergic/Immunologic: Negative for susceptible to infections.  Neurological: Positive for weakness. Negative for dizziness, light-headedness, headaches and memory loss.  Hematological: Positive for anemia. Negative for bruising/bleeding tendency.  Psychiatric/Behavioral: Negative for confusion. The patient is not nervous/anxious.     PMFS History:  Patient Active Problem List   Diagnosis Date Noted  . Anemia due to antineoplastic chemotherapy 12/15/2017  . Hypomagnesemia 12/15/2017  . Iron deficiency anemia 12/15/2017  . S/P splenectomy 12/15/2017  . Lynch syndrome 11/22/2017  . Anterior leg pain, right 11/21/2017  . Genetic testing 11/18/2017  . Inflammatory arthritis 11/08/2017  . Dysuria 11/02/2017  . Peripheral neuropathy due to chemotherapy ([place]) 10/22/2017  . Bone pain 10/21/2017  . Family history of colon cancer   . Pancreatic cancer ([place]) 10/17/2017  . Family history of uterine cancer 10/17/2017  . Sinusitis, chronic 09/15/2017  . Lesion of pancreas 09/15/2017  . Endometrial cancer ([place]) 08/31/2017  . Secondary malignant neoplasm of right ovary ([place]) 08/31/2017  . Pustular folliculitis 45/36/4680  . MRSA colonization 05/12/2017  . Left foot pain 01/10/2017  . Osteoarthritis of both knees 12/01/2016  . Class 3 severe obesity due to excess calories without serious comorbidity with body mass index (BMI) of 40.0 to 44.9 in adult ([place]) 10/01/2016  . High risk medication use 02/06/2016  . Family history of rheumatoid arthritis 02/06/2016  . H/O seasonal allergies 02/06/2016  . Rheumatoid arthritis of multiple sites with negative rheumatoid factor ([place]) 02/03/2016  .  History of colon cancer 02/03/2016  . HLA B27 (HLA B27 positive) 02/03/2016    Past Medical History:  Diagnosis Date  . Allergic rhinitis, seasonal   . Cecal cancer ([place]) 2001   Stage II (T3N0)  04-11-2001cecum-partial colectomy and  completed chemo 2002  . Endometrial cancer ([place]) 08/2017   Stage IA, Grade 1  . Family history of colon cancer   . Family history of uterine cancer   . History of MRSA infection 01/2017   followed by infectious disease center--  recurrent pustular folliculitis  . Nephrolithiasis    bilateral nonobstructive calculi per CT 08-18-2017  . OA (osteoarthritis)   . Ovarian cancer, right ([place]) 08/2017   Stage II Grade 2 Endometrioid  . Rheumatoid arthritis [facility])    rheumatologist-  dr devenswar-- treated w/ oral prednisone daily and methotrexate injection every 3 wks    Family History  Problem Relation Age of Onset  . Arthritis/Rheumatoid Mother   . Uterine cancer Mother 32  . Allergic rhinitis Father   . Heart disease Father   . Drug abuse Son   . Other Maternal Aunt        Unknown GYN CA  . Colon cancer Maternal Uncle 4  . Diabetes Maternal Grandmother   . Cancer Maternal Grandfather        d. 36s of liver/lung cancer  . Angioedema Neg Hx   . Asthma Neg Hx   . Atopy Neg Hx   . Eczema Neg Hx   . Immunodeficiency Neg Hx   . Urticaria Neg Hx    Past Surgical History:  Procedure Laterality Date  . BREAST LUMPECTOMY WITH RADIOACTIVE SEED LOCALIZATION Left 06/28/2014   Benign Procedure: RADIOACTIVE SEED LOCALIZATION LEFT BREAST LUMPECTOMY;  Surgeon: Excell Seltzer, MD;  Location: [facility];  Service: General;  Laterality: Left;  . COLONOSCOPY  06/19/14  . EYE SURGERY Left    plug in tear duct  . IR IMAGING GUIDED PORT INSERTION  09/26/2017  . KNEE ARTHROSCOPY    . PANCREATECTOMY  11/25/2017  . PORT A CATH REVISION  2001   in and out  . RIGHT COLECTOMY  06/02/2009   cecum cancer  . ROBOTIC ASSISTED TOTAL HYSTERECTOMY WITH BILATERAL SALPINGO OOPHERECTOMY Bilateral 08/30/2017   Procedure: XI ROBOTIC ASSISTED TOTAL  HYSTERECTOMY BILATERAL  SALPINGO OOPHORECTOMY; LYSIS OF ADHESIONS;  Surgeon: Isabel Caprice, MD;  Location: [facility];  Service: Gynecology;  Laterality:  Bilateral;  . SPLENECTOMY, PARTIAL  11/25/2017   Social History   Social History Narrative  . Not on file    Objective: Vital Signs: BP 128/85 (BP Location: Left Arm, Patient Position: Sitting, Cuff Size: Large)   Pulse 82   Resp 15   Ht '5\' 2"'  (1.575 m)   Wt 218 lb 9.6 oz (99.2 kg)   LMP 08/30/2017   BMI 39.98 kg/m    Physical Exam  Constitutional: She is oriented to person, place, and time. She appears well-developed and well-nourished.  HENT:  Head: Normocephalic and atraumatic.  Eyes: Conjunctivae and EOM are normal.  Neck: Normal range of motion.  Cardiovascular: Normal rate, regular rhythm, normal heart sounds and intact distal pulses.  Pulmonary/Chest: Effort normal and breath sounds normal.  Abdominal: Soft. Bowel sounds are normal.  Lymphadenopathy:    She has no cervical adenopathy.  Neurological: She is alert and oriented to person, place, and time.  Skin: Skin is warm and dry. Capillary refill takes less than 2 seconds.  Psychiatric: She has a normal  mood and affect. Her behavior is normal.  Nursing note and vitals reviewed.    Musculoskeletal Exam: C-spine thoracic lumbar spine good range of motion.  Shoulder joints elbow joints wrist joint MCPs PIPs DIPs were in good range of motion with no synovitis.  Hip joints with good range of motion.  She has no warmth swelling or effusion in her knee joints but painful range of motion.  CDAI Exam: CDAI Score: 9  Patient Global Assessment: 5 (mm); Provider Global Assessment: 5 (mm) Swollen: 0 ; Tender: 10  Joint Exam      Right  Left  Glenohumeral   Tender   Tender  PIP 2      Tender  PIP 3   Tender   Tender  PIP 4   Tender     Knee   Tender   Tender  Ankle   Tender   Tender     Investigation: No additional findings.  Imaging: No results found.  Recent Labs: Lab Results  Component Value Date   WBC 11.2 (H) 12/15/2017   HGB 10.6 (L) 12/15/2017   PLT 806 (H) 12/15/2017   NA 141 12/15/2017   K 3.6  12/15/2017   CL 101 12/15/2017   CO2 25 12/15/2017   GLUCOSE 137 (H) 12/15/2017   BUN 14 12/15/2017   CREATININE 0.72 12/15/2017   BILITOT 0.2 (L) 12/15/2017   ALKPHOS 65 12/15/2017   AST 21 12/15/2017   ALT 27 12/15/2017   PROT 8.1 12/15/2017   ALBUMIN 3.7 12/15/2017   CALCIUM 10.0 12/15/2017   GFRAA >60 12/15/2017    Speciality Comments: No specialty comments available.  Procedures:  Large Joint Inj: bilateral knee on 12/26/2017 10:08 AM Indications: pain Details: 27 G 1.5 in needle, medial approach  Arthrogram: No  Medications (Right): 1.5 mL lidocaine 1 %; 40 mg triamcinolone acetonide 40 MG/ML Medications (Left): 1.5 mL lidocaine 1 %; 40 mg triamcinolone acetonide 40 MG/ML Outcome: tolerated well, no immediate complications Procedure, treatment alternatives, risks and benefits explained, specific risks discussed. Consent was given by the patient. Immediately prior to procedure a time out was called to verify the correct patient, procedure, equipment, support staff and site/side marked as required. Patient was prepped and draped in the usual sterile fashion.     Allergies: Codeine; Penicillins; and Bactrim [sulfamethoxazole-trimethoprim]   Assessment / Plan:     Visit Diagnoses: Rheumatoid arthritis of multiple sites with negative rheumatoid factor ([place]) - She has been taking tylenol and Dilaudid for pain relief.  Patient states the medications cause constipation and she has been avoiding it.  She had to reduce her prednisone due to surgery.  she is on Prednisone 5 mg po daily.  I discouraged the use of high-dose prednisone as it causes weight gain.  She had no synovitis on examination today.  I believe her rheumatoid arthritis is quite well controlled with the chemotherapy.  HLA B27 (HLA B27 positive)  High risk medication use - She has had 2 rounds of chemotherapy and patient had partial pancreatectomy and splenectomy on 11/25/17. She has recovered well from  surgery.  Primary osteoarthritis of both knees-she is been having severe pain and discomfort in her knee joints and difficulty walking.  No warmth swelling or effusion was noted.  The osteoarthritis has been an issue in her knees.  She had very good response to Visco supplement injections in the past but they were declined by her insurance.  Per her request bilateral knee joints were injected with cortisone  as described above.  We will apply for Visco supplement injections again.  Family history of rheumatoid arthritis  Pancreatic neoplasm - surgical partial resection on 11/25/17.  She also had a splenectomy.  Secondary malignant neoplasm of right ovary ([place]) - She is following closely with Dr. Alvy Bimler.  According to her last note on 11/08/2017 there is no sign of cancer recurrence.    History of colon cancer - Right hemicolectomy in 2001.   Status post splenectomy   Orders: Orders Placed This Encounter  Procedures  . Large Joint Inj   No orders of the defined types were placed in this encounter.   Face-to-face time spent with patient was 30 minutes. Greater than 50% of time was spent in counseling and coordination of care.  Follow-Up Instructions: Return for Rheumatoid arthritis, Osteoarthritis.   Bo Merino, MD  Note - This record has been created using Editor, commissioning.  Chart creation errors have been sought, but may not always  have been located. Such creation errors do not reflect on  the standard of medical care.

## 2017-12-20 ENCOUNTER — Encounter: Payer: Self-pay | Admitting: Hematology and Oncology

## 2017-12-20 ENCOUNTER — Telehealth: Payer: Self-pay

## 2017-12-20 NOTE — Telephone Encounter (Signed)
Per Dr. Alvy Bimler, faxed last office note and letter from Dr. Alvy Bimler to [phone].

## 2017-12-26 ENCOUNTER — Encounter: Payer: Self-pay | Admitting: Rheumatology

## 2017-12-26 ENCOUNTER — Ambulatory Visit (INDEPENDENT_AMBULATORY_CARE_PROVIDER_SITE_OTHER): Payer: BLUE CROSS/BLUE SHIELD | Admitting: Rheumatology

## 2017-12-26 ENCOUNTER — Telehealth: Payer: Self-pay

## 2017-12-26 VITALS — BP 128/85 | HR 82 | Resp 15 | Ht 62.0 in | Wt 218.6 lb

## 2017-12-26 DIAGNOSIS — Z1589 Genetic susceptibility to other disease: Secondary | ICD-10-CM | POA: Diagnosis not present

## 2017-12-26 DIAGNOSIS — C7961 Secondary malignant neoplasm of right ovary: Secondary | ICD-10-CM

## 2017-12-26 DIAGNOSIS — M170 Bilateral primary osteoarthritis of knee: Secondary | ICD-10-CM

## 2017-12-26 DIAGNOSIS — M0609 Rheumatoid arthritis without rheumatoid factor, multiple sites: Secondary | ICD-10-CM | POA: Diagnosis not present

## 2017-12-26 DIAGNOSIS — Z79899 Other long term (current) drug therapy: Secondary | ICD-10-CM | POA: Diagnosis not present

## 2017-12-26 DIAGNOSIS — Z9081 Acquired absence of spleen: Secondary | ICD-10-CM

## 2017-12-26 DIAGNOSIS — Z85038 Personal history of other malignant neoplasm of large intestine: Secondary | ICD-10-CM

## 2017-12-26 DIAGNOSIS — Z8261 Family history of arthritis: Secondary | ICD-10-CM

## 2017-12-26 DIAGNOSIS — D490 Neoplasm of unspecified behavior of digestive system: Secondary | ICD-10-CM

## 2017-12-26 MED ORDER — LIDOCAINE HCL 1 % IJ SOLN
1.5000 mL | INTRAMUSCULAR | Status: AC | PRN
  Administered 2017-12-26: 1.5 mL

## 2017-12-26 MED ORDER — TRIAMCINOLONE ACETONIDE 40 MG/ML IJ SUSP
40.0000 mg | INTRAMUSCULAR | Status: AC | PRN
  Administered 2017-12-26: 40 mg via INTRA_ARTICULAR

## 2017-12-26 NOTE — Telephone Encounter (Signed)
Please apply for bilateral knee visco, per Dr. Estanislado Pandy. Patient has failed cortisone and has had good response to visco in the past.

## 2017-12-27 ENCOUNTER — Ambulatory Visit (HOSPITAL_COMMUNITY)
Admission: RE | Admit: 2017-12-27 | Discharge: 2017-12-27 | Disposition: A | Discharge status: Home / Self Care | Payer: BLUE CROSS/BLUE SHIELD | Source: Ambulatory Visit | Attending: Hematology and Oncology | Admitting: Hematology and Oncology

## 2017-12-27 ENCOUNTER — Other Ambulatory Visit: Payer: Self-pay | Admitting: Rheumatology

## 2017-12-27 DIAGNOSIS — I700 Atherosclerosis of aorta: Secondary | ICD-10-CM | POA: Insufficient documentation

## 2017-12-27 DIAGNOSIS — C7961 Secondary malignant neoplasm of right ovary: Secondary | ICD-10-CM | POA: Insufficient documentation

## 2017-12-27 DIAGNOSIS — C541 Malignant neoplasm of endometrium: Secondary | ICD-10-CM | POA: Diagnosis not present

## 2017-12-27 DIAGNOSIS — N200 Calculus of kidney: Secondary | ICD-10-CM | POA: Diagnosis not present

## 2017-12-27 DIAGNOSIS — Z9049 Acquired absence of other specified parts of digestive tract: Secondary | ICD-10-CM | POA: Insufficient documentation

## 2017-12-27 DIAGNOSIS — N6489 Other specified disorders of breast: Secondary | ICD-10-CM | POA: Insufficient documentation

## 2017-12-27 DIAGNOSIS — Z9081 Acquired absence of spleen: Secondary | ICD-10-CM | POA: Diagnosis not present

## 2017-12-27 DIAGNOSIS — K654 Sclerosing mesenteritis: Secondary | ICD-10-CM | POA: Diagnosis not present

## 2017-12-27 DIAGNOSIS — D1801 Hemangioma of skin and subcutaneous tissue: Secondary | ICD-10-CM | POA: Diagnosis not present

## 2017-12-27 DIAGNOSIS — C259 Malignant neoplasm of pancreas, unspecified: Secondary | ICD-10-CM

## 2017-12-27 MED ORDER — IOHEXOL 300 MG/ML  SOLN
100.0000 mL | Freq: Once | INTRAMUSCULAR | Status: AC | PRN
  Administered 2017-12-27: 100 mL via INTRAVENOUS

## 2017-12-27 MED ORDER — PREDNISONE 5 MG PO TABS: 5.0000 mg | ORAL_TABLET | Freq: Every day | ORAL | 1 refills | Status: DC

## 2017-12-27 MED ORDER — SODIUM CHLORIDE 0.9 % IJ SOLN
INTRAMUSCULAR | Status: AC
  Filled 2017-12-27: qty 50

## 2017-12-27 NOTE — Telephone Encounter (Addendum)
Last Visit: 12/26/17 Next Visit: 05/15/18  Per office note on 12/26/17 patient is on Prednisone 5 mg daily.

## 2017-12-29 NOTE — Telephone Encounter (Signed)
Noted  

## 2018-01-01 ENCOUNTER — Other Ambulatory Visit: Payer: Self-pay | Admitting: Hematology

## 2018-01-01 DIAGNOSIS — C259 Malignant neoplasm of pancreas, unspecified: Secondary | ICD-10-CM

## 2018-01-01 NOTE — Progress Notes (Signed)
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 Mixed attenuation mass emanates from the right adnexa measuring 10.8 x 8.0 cm very suspicious for right ovarian carcinoma. 2. Abnormality of the tail of the pancreas may be due to mild pancreatitis and small pseudocyst formation, but neoplasm cannot be excluded. 3. Small amount of ascites within abdomen and pelvis. 4. Small nonobstructing renal calculi bilaterally.     08/30/2017 Pathology Results    1. Uterus and cervix, with left fallopian tube - ENDOMETRIOID ADENOCARCINOMA, FIGO GRADE I, ARISING IN A BACKGROUND OF DIFFUSE COMPLEX ATYPICAL HYPERPLASIA. - CARCINOMA INVADES FOR OF DEPTH OF 0.2 CM WHERE THICKNESS OF MYOMETRIAL WALL IS 2.1 CM. - ALL RESECTION MARGINS ARE NEGATIVE FOR CARCINOMA. - NEGATIVE FOR LYMPHOVASCULAR OR PERINEURAL INVASION. - CERVICAL STROMA IS NOT INVOLVED. - SEE ONCOLOGY TABLE. - SEE NOTE 2. Ovary and fallopian tube, right - PRIMARY OVARIAN ENDOMETRIOID ADENOCARCINOMA, FIGO GRADE II, 12 CM. - THE OVARIAN SURFACE IS FOCALLY INVOLVED BY CARCINOMA. - NEGATIVE FOR LYMPHOVASCULAR INVASION. - BENIGN UNREMARKABLE FALLOPIAN TUBE, NEGATIVE FOR CARCINOMA. - SEE ONCOLOGY TABLE. - SEE NOTE 3. Cul-de-sac biopsy - METASTATIC ADENOCARCINOMA, MOST CONSISTENT WITH PRIMARY OVARIAN ENDOMETRIOID ADENOCARCINOMA. 4. Ovary, left - BENIGN UNREMARKABLE OVARY, NEGATIVE FOR MALIGNANCY. Microscopic Comment 1. UTERUS, CARCINOMA OR CARCINOSARCOMA Procedure: Total  hysterectomy with bilateral salpingo-oophorectomy. Histologic type: Endometrioid adenocarcinoma. Histologic Grade: FIGO Grade I Myometrial invasion: Depth of invasion: 2 mm Myometrial thickness: 21 mm Uterine Serosa Involvement: Not identified Cervical stromal involvement: Not identified Extent of involvement of other organs: Not applicable Lymphovascular invasion: Not identified Regional Lymph Nodes: Examined: 0 Sentinel 0 Non-sentinel 0 Total Tumor block for ancillary studies: 1H MMR / MSI testing: Pending Pathologic Stage Classification (pTNM, AJCC 8th edition): pT1a, pNX (v4.1.0.0) 2. OVARY or FALLOPIAN TUBE or PRIMARY PERITONEUM: Procedure: Salpingo-oophorectomy Specimen Integrity: Intact Tumor Site: Right ovary Ovarian Surface Involvement (required only if applicable): Focally involved by carcinoma Fallopian Tube Surface Involvement (required only if applicable): Not identified Tumor Size: 12 cm Histologic Type: Endometrioid adenocarcinoma Histologic Grade: Grade II Implants (required for advanced stage serous/seromucinous borderline tumors only): Not applicable Other Tissue/ Organ Involvement: Cul de sac biopsy involved by tumor Largest Extrapelvic Peritoneal Focus (required only if applicable): Not applicable Peritoneal/Ascitic Fluid: Negative for carcinoma (case # [id number]) Treatment Effect (required only for high-grade serous carcinomas): Not applicable Regional Lymph Nodes: No lymph nodes submitted or found Number of Lymph Nodes Examined: 0 Pathologic Stage Classification (pTNM, AJCC 8th Edition): pT2b, pN0 Representative Tumor Block: 2B and 2E 1. Molecular study for microsatellite instability and immunohistochemical stains for MMR-related proteins are pending and will be reported in an addendum. 2. Immunohistochemical stain show that the ovarian tumor is positive for CK7 and PAX8 (both diffuse), CDX2 (patchy and weak); and negative for CK20. This immunoprofile is  consistent with the above diagnosis. Dr. Lyndon Code has reviewed this case and concurs with the above diagnosis. Molecular study for microsatellite instability and immunohistochemical stains for MMR-related proteins are pending and will be reported in an addendum    08/30/2017 Genetic  Testing    Patient has genetic testing done for MSI  Results revealed patient has the following mutation(s): loss of [facility] 2    08/30/2017 Surgery    Surgeon: Mart Piggs, MD Pre-operative Diagnosis:  1. Adnexal mass 2. Abnormal uterine bleeding 3. H/o Cecal CA  Post-operative Diagnosis:  1. Adhesive disease post colon resection 2. Endometrial cancer NOS 3. Adenocarcinoma unknown origin, right ovary, suspicious for GI primary  Operation:  1. Lysis of adhesions ~30 minutes 2. Robotic-assisted laparoscopic total hysterectomy with right salpingo-oophorectomy and left salpingectomy 3. Left oophorectomy (RA-laparoscopic) 4. Pelvic washings  Findings: Adhesions of omentum to anterior abdominal wall. Enlarged cystic right ovary ~10cm. Uterus had small nodules on serosa near where right adnexa was intimate with the surface. No obvious intraoperative rupture of cyst, although in 2 areas the wall was thin and one of these areas had some bleeding. Slight scarring of left bladder dome to LUS/cervix. Uterus on frozen section c/w hyperplasia and a small focus of endometrial CA - no myo invasion, <2cm in size. Frozen section on the right adnexa was carcinoma, met from colon or possibly Gyn, favor GI primary, defer to permanent. Left ovary was WNL.      09/15/2017 Cancer Staging    Staging form: Corpus Uteri - Carcinoma and Carcinosarcoma, AJCC 8th Edition - Pathologic: FIGO Stage IA (pT1a, pN0, cM0) - Signed by Heath Lark, MD on 09/15/2017    09/26/2017 Procedure    Successful placement of a right internal jugular approach power injectable Port-A-Cath. The catheter is ready for immediate use.    11/07/2017 Imaging     1. Since 08/18/2017, similar to slight decrease in size of a pancreatic body/tail junction lesion. Cross modality comparison relative to 09/16/2017 MRI is also grossly similar. 2. No evidence of metastatic disease. 3. Aortic Atherosclerosis (ICD10-I70.0).  4. Left nephrolithiasis.    11/18/2017 Genetic Testing    MSH2 c.2237dupT pathogenic mutation identified in the CancerNext panel.  The CancerNext gene panel offered by [employer] includes sequencing and rearrangement analysis for the following 34 genes:   APC, ATM, BARD1, BMPR1A, BRCA1, BRCA2, BRIP1, CDH1, CDK4, CDKN2A, CHEK2, DICER1, HOXB13, EPCAM, GREM1, MLH1, MRE11A, MSH2, MSH6, MUTYH, NBN, NF1, PALB2, PMS2, POLD1, POLE, PTEN, RAD50, RAD51C, RAD51D, SMAD4, SMARCA4, STK11, and TP53.  The report date is November 18, 2017.  MSH2 [id number] pathogenic mutation identified on somatic testing.  These results are consistent with a diagnosis of Lynch syndrome.    12/21/2017 -  Chemotherapy    The patient had palonosetron (ALOXI) injection 0.25 mg, 0.25 mg, Intravenous,  Once, 0 of 6 cycles irinotecan (CAMPTOSAR) 320 mg in dextrose 5 % 500 mL chemo infusion, 150 mg/m2 = 320 mg (100 % of original dose 150 mg/m2), Intravenous,  Once, 0 of 6 cycles Dose modification: 150 mg/m2 (original dose 150 mg/m2, Cycle 1, Reason: Provider Judgment) leucovorin 828 mg in dextrose 5 % 250 mL infusion, 400 mg/m2 = 828 mg, Intravenous,  Once, 0 of 6 cycles oxaliplatin (ELOXATIN) 175 mg in dextrose 5 % 500 mL chemo infusion, 85 mg/m2 = 175 mg, Intravenous,  Once, 0 of 6 cycles fluorouracil (ADRUCIL) 4,950 mg in sodium chloride 0.9 % 51 mL chemo infusion, 2,400 mg/m2 = 4,950 mg, Intravenous, 1 Day/Dose, 0 of 6 cycles  for chemotherapy treatment.      Secondary malignant neoplasm of right ovary ([place])   08/23/2017 Tumor Marker    Patient's tumor was tested for the following markers: CA-125 Results of the tumor marker  test revealed 139.8    08/31/2017  Initial Diagnosis    Secondary malignant neoplasm of right ovary ([place])    09/15/2017 Cancer Staging    Staging form: Ovary, Fallopian Tube, and Primary Peritoneal Carcinoma, AJCC 8th Edition - Pathologic: Stage IIB (pT2b, pN0, cM0) - Signed by Heath Lark, MD on 09/15/2017    09/27/2017 Imaging    No evidence of metastatic disease or other acute findings within the thorax.  4 cm low-attenuation mass in pancreatic tail, highly suspicious for pancreatic carcinoma. This is caused splenic vein thrombosis, with new venous collaterals in the left upper quadrant. Consider endoscopic ultrasound with FNA for tissue diagnosis.  Stable benign hepatic hemangioma.     09/27/2017 Tumor Marker    Patient's tumor was tested for the following markers: CA-125 Results of the tumor marker test revealed 39.4    11/02/2017 Tumor Marker    Patient's tumor was tested for the following markers: CA-125 Results of the tumor marker test revealed 19    11/07/2017 Tumor Marker    Patient's tumor was tested for the following markers: CA-125 Results of the tumor marker test revealed 18.3    11/18/2017 Genetic Testing    MSH2 c.2237dupT pathogenic mutation identified in the CancerNext panel.  The CancerNext gene panel offered by [employer] includes sequencing and rearrangement analysis for the following 34 genes:   APC, ATM, BARD1, BMPR1A, BRCA1, BRCA2, BRIP1, CDH1, CDK4, CDKN2A, CHEK2, DICER1, HOXB13, EPCAM, GREM1, MLH1, MRE11A, MSH2, MSH6, MUTYH, NBN, NF1, PALB2, PMS2, POLD1, POLE, PTEN, RAD50, RAD51C, RAD51D, SMAD4, SMARCA4, STK11, and TP53.  The report date is November 18, 2017.  MSH2 [id number] pathogenic mutation identified on somatic testing.  These results are consistent with a diagnosis of Lynch syndrome.    12/15/2017 Tumor Marker    Patient's tumor was tested for the following markers: CA-125 Results of the tumor marker test revealed 57.3     Pancreatic cancer ([place])   10/13/2017 Pathology  Results    Pancreas tail mass, endoscopic ultrasound-guided, fine needle aspiration II (smears and cell block): Adenocarcinoma    11/18/2017 Genetic Testing    MSH2 c.2237dupT pathogenic mutation identified in the CancerNext panel.  The CancerNext gene panel offered by [employer] includes sequencing and rearrangement analysis for the following 34 genes:   APC, ATM, BARD1, BMPR1A, BRCA1, BRCA2, BRIP1, CDH1, CDK4, CDKN2A, CHEK2, DICER1, HOXB13, EPCAM, GREM1, MLH1, MRE11A, MSH2, MSH6, MUTYH, NBN, NF1, PALB2, PMS2, POLD1, POLE, PTEN, RAD50, RAD51C, RAD51D, SMAD4, SMARCA4, STK11, and TP53.  The report date is November 18, 2017.  MSH2 [id number] pathogenic mutation identified on somatic testing.  These results are consistent with a diagnosis of Lynch syndrome.    11/24/2017 Pathology Results    A. "TAIL OF PANCREAS AND SPLEEN", DISTAL PANCREATECTOMY AND SPLENECTOMY: Invasive ductal adenocarcinoma, moderately to poorly differentiated with focal signet ring cell features, of pancreas (distal).   The carcinoma is 2.5 cm in greatest dimension grossly.   Treatment effects present in the form of fibrosis (50%). No lymphovascular or definite perineural invasion identified. All surgical margins are negative for tumor or high-grade dysplasia. Adjacent mucinous neoplasm, most consistent with Intraductal papillary mucinous neoplasm (IPMN) with low-grade dysplasia.   Uninvolved pancreas show atrophy and focal acute inflammation.   Twelve benign lymph nodes (0/12). Spleen with no significant histopathologic abnormalities.  PROCEDURE: distal pancreatectomy and splenectomy TUMOR SITE: distal pancreas TUMOR SIZE:  GREATEST DIMENSION: 2.5 cm  ADDITIONAL DIMENSIONS:  x  cm HISTOLOGIC TYPE: ductal adenocarcinoma HISTOLOGIC GRADE: grade 3 TUMOR  EXTENSION: peripancreatic soft tissue MARGINS: negative for tumor TREATMENT EFFECT:  treatment effects present in the form of fibrosis (50%). LYMPHOVASCULAR INVASION: not identified PERINEURAL INVASION: no definite evidence REGIONAL LYMPH NODES:   NUMBER OF LYMPH NODES INVOLVED: 0   NUMBER OF LYMPH NODES EXAMINED: 12 PATHOLOGIC STAGE CLASSIFICATION (pTNM, AJCC 8th Ed): ypT2, ypN0 DISTANT METASTASIS (pM): pMx ADDITIONAL PATHOLOGIC FINDINGS: mucinous neoplasm, most consistent with intraductal papillary mucinous neoplasm (IPMN) with low-grade dysplasia, is identified adjacent to the main tumor.    11/25/2017 Surgery    She had surgery at [facility] 1. Exploratory Laparotomy 2. Distal Pancreatectomy and Splenectomy 3. Intraoperative Ultrasound 4. Open Cholecystectomy     12/15/2017 Cancer Staging    Staging form: Exocrine Pancreas, AJCC 8th Edition - Clinical: Stage IB (cT2, cN0, cM0) - Signed by Heath Lark, MD on 12/15/2017    12/28/2017 Imaging    12/28/2017 CT Abdomen  IMPRESSION: 1. Postoperative findings from recent partial pancreatectomy including a 21 cubic cm fluid collection along the pancreatic resection margin which could represent early pseudocyst. 2. Nodularity along the lateral limb of the left adrenal gland could also be postoperative but merit surveillance, as the pancreatic lesion was in close proximity to this adrenal gland on the prior CT of 11/07/2017. 3. Asymmetric fullness inferiorly in the left breast. The patient has a history of prior left breast procedures, correlation with mammography is recommended. 4. Other imaging findings of potential clinical significance: Stable hemangioma in the left hepatic lobe. Splenectomy. Aortic Atherosclerosis (ICD10-I70.0). Right hemicolectomy. Small focus of fat necrosis in the right anterior abdominal wall subcutaneous tissues near the laparotomy site. Bilateral nonobstructive nephrolithiasis.     INTERVAL HISTORY: Dana Hicks is a 42 y.o. female who is here for follow-up. She is under  Dr. Calton Dach care and is seen by me in her absence. Today, she is here alone. She is doing well. She is healing well, but states that she finds it difficult to walk now due to worsening RA and joint pain. She saw her rheumatologist and has received steroid injections. She takes Tramadol for pain relief which causes constipation. She is eating well. She denies nausea or vomiting. She sometimes gets abdominal cramps and shoulder pain that are short lived. Her shoulder pain worsens with food. She was informed that her shoulder pain is a nerve compression related to her surgery.   She states that she was diagnosed with colon cancer at age 45. She has 2 children.  She has been out of work for 4 months now. She is interested in going back, but she feels stressed and fatigued as her job requires long hours of standing. She works in [employer]. She is concerned about not being able to go back. Her job recently stopped paying her. Her husband works, but is very stressed out.   REVIEW OF SYSTEMS:   Constitutional: Denies fevers, chills or abnormal weight loss (+) good appetite  Eyes: Denies blurriness of vision Ears, nose, mouth, throat, and face: Denies mucositis or sore throat Respiratory: Denies cough, dyspnea or wheezes Cardiovascular: Denies palpitation, chest discomfort or lower extremity swelling Gastrointestinal:  Denies nausea, heartburn or change in bowel habits (+) occasional abdominal cramps Skin: Denies abnormal skin rashes Lymphatics: Denies new lymphadenopathy or easy bruising Neurological:Denies numbness, tingling or new weaknesses Behavioral/Psych: Mood is stable, no new changes  MSK: (+) shoulder pain, surgery related (+) RA All other systems were reviewed with the patient and are negative.  MEDICAL HISTORY:  Past Medical History:  Diagnosis  Date  . Allergic rhinitis, seasonal   . Cecal cancer ([place]) 2001   Stage II (T3N0)  04-11-2001cecum-partial colectomy and completed chemo 2002  .  Endometrial cancer ([place]) 08/2017   Stage IA, Grade 1  . Family history of colon cancer   . Family history of uterine cancer   . History of MRSA infection 01/2017   followed by infectious disease center--  recurrent pustular folliculitis  . Nephrolithiasis    bilateral nonobstructive calculi per CT 08-18-2017  . OA (osteoarthritis)   . Ovarian cancer, right ([place]) 08/2017   Stage II Grade 2 Endometrioid  . Rheumatoid arthritis [facility])    rheumatologist-  dr devenswar-- treated w/ oral prednisone daily and methotrexate injection every 3 wks    SURGICAL HISTORY: Past Surgical History:  Procedure Laterality Date  . BREAST LUMPECTOMY WITH RADIOACTIVE SEED LOCALIZATION Left 06/28/2014   Benign Procedure: RADIOACTIVE SEED LOCALIZATION LEFT BREAST LUMPECTOMY;  Surgeon: Excell Seltzer, MD;  Location: [facility];  Service: General;  Laterality: Left;  . COLONOSCOPY  06/19/14  . EYE SURGERY Left    plug in tear duct  . IR IMAGING GUIDED PORT INSERTION  09/26/2017  . KNEE ARTHROSCOPY    . PANCREATECTOMY  11/25/2017  . PORT A CATH REVISION  2001   in and out  . RIGHT COLECTOMY  06/02/2009   cecum cancer  . ROBOTIC ASSISTED TOTAL HYSTERECTOMY WITH BILATERAL SALPINGO OOPHERECTOMY Bilateral 08/30/2017   Procedure: XI ROBOTIC ASSISTED TOTAL  HYSTERECTOMY BILATERAL  SALPINGO OOPHORECTOMY; LYSIS OF ADHESIONS;  Surgeon: Isabel Caprice, MD;  Location: [facility];  Service: Gynecology;  Laterality: Bilateral;  . SPLENECTOMY, PARTIAL  11/25/2017    I have reviewed the social history and family history with the patient and they are unchanged from previous note.  ALLERGIES:  is allergic to codeine; penicillins; and bactrim [sulfamethoxazole-trimethoprim].  MEDICATIONS:  Current Outpatient Medications  Medication Sig Dispense Refill  . acetaminophen (TYLENOL) 650 MG CR tablet Take 1,300 mg by mouth every 8 (eight) hours.    Marland Kitchen HYDROmorphone (DILAUDID) 4 MG tablet Take 1 tablet (4 mg total) by  mouth every 4 (four) hours as needed for severe pain. 30 tablet 0  . lidocaine-prilocaine (EMLA) cream Apply to affected area once (Patient taking differently: Apply 1 application topically daily as needed (port). Apply to affected area once) 30 g 3  . loratadine (CLARITIN) 10 MG tablet Take 10 mg by mouth daily after breakfast.    . naproxen sodium (ALEVE) 220 MG tablet Take 220 mg by mouth.    Marland Kitchen omeprazole (PRILOSEC) 20 MG capsule Take 20 mg by mouth 2 (two) times daily before a meal.     . ondansetron (ZOFRAN) 8 MG tablet Take 1 tablet (8 mg total) by mouth every 8 (eight) hours as needed for refractory nausea / vomiting. Start on day 3 after chemo. 30 tablet 1  . predniSONE (DELTASONE) 10 MG tablet TAKE 1 TABLET BY MOUTH EVERY DAY WITH BREAKFAST (Patient taking differently: Take 5 mg by mouth daily with breakfast. ) 30 tablet 0  . predniSONE (DELTASONE) 5 MG tablet Take 1 tablet (5 mg total) by mouth daily with breakfast. 30 tablet 1  . prochlorperazine (COMPAZINE) 10 MG tablet Take 1 tablet (10 mg total) by mouth every 6 (six) hours as needed (Nausea or vomiting). 30 tablet 1  . simethicone (MYLICON) 124 MG chewable tablet Chew 125 mg by mouth every 6 (six) hours as needed for flatulence.    . traMADol Veatrice [place])  50 MG tablet Take 2 tablets (100 mg total) by mouth every 6 (six) hours as needed. 90 tablet 0   No current facility-administered medications for this visit.     PHYSICAL EXAMINATION: ECOG PERFORMANCE STATUS: 1 - Symptomatic but completely ambulatory  Vitals:   01/02/18 1138  BP: 125/87  Pulse: 80  Resp: 18  Temp: 99.3 F (37.4 C)  SpO2: 96%   Filed Weights   01/02/18 1138  Weight: 214 lb 12.8 oz (97.4 kg)    GENERAL:alert, no distress and comfortable (+) hair loss (+) uses cane SKIN: skin color, texture, turgor are normal, no rashes or significant lesions EYES: normal, Conjunctiva are pink and non-injected, sclera clear OROPHARYNX:no exudate, no erythema and lips,  buccal mucosa, and tongue normal  NECK: supple, thyroid normal size, non-tender, without nodularity LYMPH:  no palpable lymphadenopathy in the cervical, axillary or inguinal LUNGS: clear to auscultation and percussion with normal breathing effort HEART: regular rate & rhythm and no murmurs and no lower extremity edema ABDOMEN:abdomen soft, non-tender and normal bowel sounds (+) surgical site healing well Musculoskeletal:no cyanosis of digits and no clubbing  NEURO: alert & oriented x 3 with fluent speech, no focal motor/sensory deficits  LABORATORY DATA:  I have reviewed the data as listed CBC Latest Ref Rng & Units 01/02/2018 12/15/2017 11/11/2017  WBC 4.0 - 10.5 K/uL 14.1(H) 11.2(H) 7.6  Hemoglobin 12.0 - 15.0 g/dL 10.9(L) 10.6(L) 9.7(L)  Hematocrit 36.0 - 46.0 % 36.5 36.1 32.3(L)  Platelets 150 - 400 K/uL 579(H) 806(H) 249     CMP Latest Ref Rng & Units 01/02/2018 12/15/2017 11/11/2017  Glucose 70 - 99 mg/dL 112(H) 137(H) 98  BUN 6 - 20 mg/dL '13 14 10  ' Creatinine 0.44 - 1.00 mg/dL 0.70 0.72 0.65  Sodium 135 - 145 mmol/L 140 141 139  Potassium 3.5 - 5.1 mmol/L 3.8 3.6 3.9  Chloride 98 - 111 mmol/L 103 101 102  CO2 22 - 32 mmol/L '26 25 28  ' Calcium 8.9 - 10.3 mg/dL 9.7 10.0 9.5  Total Protein 6.5 - 8.1 g/dL 7.6 8.1 -  Total Bilirubin 0.3 - 1.2 mg/dL 0.3 0.2(L) -  Alkaline Phos 38 - 126 U/L 59 65 -  AST 15 - 41 U/L 17 21 -  ALT 0 - 44 U/L 45(H) 27 -    RADIOGRAPHIC STUDIES: I have personally reviewed the radiological images as listed and agreed with the findings in the report. No results found.   12/28/2017 CT Abdomen  IMPRESSION: 1. Postoperative findings from recent partial pancreatectomy including a 21 cubic cm fluid collection along the pancreatic resection margin which could represent early pseudocyst. 2. Nodularity along the lateral limb of the left adrenal gland could also be postoperative but merit surveillance, as the pancreatic lesion was in close proximity to  this adrenal gland on the prior CT of 11/07/2017. 3. Asymmetric fullness inferiorly in the left breast. The patient has a history of prior left breast procedures, correlation with mammography is recommended. 4. Other imaging findings of potential clinical significance: Stable hemangioma in the left hepatic lobe. Splenectomy. Aortic Atherosclerosis (ICD10-I70.0). Right hemicolectomy. Small focus of fat necrosis in the right anterior abdominal wall subcutaneous tissues near the laparotomy site. Bilateral nonobstructive nephrolithiasis.   ASSESSMENT & PLAN:  ANAHLIA ISEMINGER is a 42 y.o. female with history of   1. Pancreatic adenocarcinoma in tail, pT2N0M0, stage IB - She recovered well from surgery.. -I have reviewed her chart including her surgical path and recent CT scan findings  from 12/27/2017.  It showed a cystic lesion at the surgical site, likely secondary to surgery.  This was reviewed by her surgeon Dr. Carlis Abbott also, no concern for recurrence at this point -Dr. Alvy Bimler recommended adjuvant chemotherapy with modified FOLFIRINOX therapy every 2 weeks, which I agree -She will receive G-CSF support on day 3 of treatment, which required peer-to-peer appeal, I called today   --I again reviewed the risks, benefits, side effects of FOLFIRINOX treatment including risk of pancytopenia, infection, mucositis, diarrhea, neuropathy etc with pt and she agreed to proceed with the plan of care -The goal of therapy is curative -Reviewed surveillance, and discussed the high risk of recurrence, she voiced good understanding. -She knows to call for any warning symptoms like dehydration, low appetite, weight loss, excessive diarrhea or nausea -Labs reviewed, CBC showed Hg 10.9 PLT 579K Neutro Abs 11.3. CMP is overall WNLs. Mg is 2. CA 19.9 pending.  -first cycle chemo tomorrow  -f/u in 2 and 4 weeks with chemo   2. Iron deficiency anemia -She has multifactorial anemia, could be due to recent surgery  causing blood loss and anemia chronic illness -Dr. Alvy Bimler recommended multivitamin supplement -She does not need blood transfusion -Hg today is 10.9. I advised her to take iron pills   3. Secondary malignant neoplasm of right ovary ([place]) -she underwent surgery and 2 cycle carbo/taxol chemo -Dr. Alvy Bimler recommended to put further treatment on hold for now and complete adjuvant treatment for pancreatic cancer -She has appointment to see GYN oncologist next month for further follow-up -Her recent CA 125 has increased, she underwent repeated CT of abdomen on December 28, 2017, which showed nodularity along the lateral limb of the left adrenal gland could also be postoperative, no definitive evidence of recurrence, will monitor closely   4. Rheumatoid arthritis of multiple sites with negative rheumatoid factor ([place]) - She has significant flare of arthritis pain recently - She will continue conservative management with pain medicine as needed along with anti-inflammatory medications -OK to resume prednisone dose to 22m for her RA if needed, will wait and see what chemo dose to her RA    5. S/P splenectomy - She has received recent vaccination postsplenectomy - Her elevated white blood cell count and platelet count are readily reflective of postop changes  6. Lynch syndrome, history of colon cancer at age of 89-She unfortunately has developed Lynch related multiple cancers, including colon cancer, pancreatic cancer and ovarian cancer. -I strongly recommend her to continue annual screening colonoscopy, she has had one in 2019   7. Social/financial stresses -Her job recently stopped paying her as she stopped attending for 4 months now -Her husband works, and is under a lot of stress. -She is very stressed out -I discussed SW referral to help her disability application, she agreed  Plan -Lab and recent CT scan reviewed, adequate for treatment, plan to start first cycle FOLFIRINOX  tomorrow -I will call her insurance to appeal G-CSF, if approved, will give it on day 3  -SW referral for disability application -I refilled tramadol -PT referral  -F/u in 2 and 4 weeks before chemo     No problem-specific Assessment & Plan notes found for this encounter.   No orders of the defined types were placed in this encounter.  All questions were answered. The patient knows to call the clinic with any problems, questions or concerns. No barriers to learning was detected. I spent 30 minutes counseling the patient face to face. The total time  spent in the appointment was 40 minutes and more than 50% was on counseling and review of test results  I, Noor Dweik am acting as scribe for Dr. Truitt Merle.  I have reviewed the above documentation for accuracy and completeness, and I agree with the above.      Truitt Merle, MD 01/02/2018

## 2018-01-02 ENCOUNTER — Inpatient Hospital Stay: Payer: BLUE CROSS/BLUE SHIELD | Attending: Hematology and Oncology

## 2018-01-02 ENCOUNTER — Inpatient Hospital Stay (HOSPITAL_BASED_OUTPATIENT_CLINIC_OR_DEPARTMENT_OTHER): Payer: BLUE CROSS/BLUE SHIELD | Admitting: Hematology

## 2018-01-02 ENCOUNTER — Inpatient Hospital Stay: Payer: BLUE CROSS/BLUE SHIELD

## 2018-01-02 ENCOUNTER — Other Ambulatory Visit: Payer: Self-pay | Admitting: Hematology

## 2018-01-02 VITALS — BP 125/87 | HR 80 | Temp 99.3°F | Resp 18 | Ht 62.0 in | Wt 214.8 lb

## 2018-01-02 DIAGNOSIS — C259 Malignant neoplasm of pancreas, unspecified: Secondary | ICD-10-CM

## 2018-01-02 DIAGNOSIS — D509 Iron deficiency anemia, unspecified: Secondary | ICD-10-CM | POA: Diagnosis not present

## 2018-01-02 DIAGNOSIS — C252 Malignant neoplasm of tail of pancreas: Secondary | ICD-10-CM

## 2018-01-02 DIAGNOSIS — Z90710 Acquired absence of both cervix and uterus: Secondary | ICD-10-CM | POA: Insufficient documentation

## 2018-01-02 DIAGNOSIS — Z90722 Acquired absence of ovaries, bilateral: Secondary | ICD-10-CM | POA: Insufficient documentation

## 2018-01-02 DIAGNOSIS — C7961 Secondary malignant neoplasm of right ovary: Secondary | ICD-10-CM

## 2018-01-02 DIAGNOSIS — E860 Dehydration: Secondary | ICD-10-CM | POA: Diagnosis not present

## 2018-01-02 DIAGNOSIS — M069 Rheumatoid arthritis, unspecified: Secondary | ICD-10-CM

## 2018-01-02 DIAGNOSIS — Z79899 Other long term (current) drug therapy: Secondary | ICD-10-CM | POA: Insufficient documentation

## 2018-01-02 DIAGNOSIS — R52 Pain, unspecified: Secondary | ICD-10-CM | POA: Insufficient documentation

## 2018-01-02 DIAGNOSIS — C541 Malignant neoplasm of endometrium: Secondary | ICD-10-CM | POA: Diagnosis not present

## 2018-01-02 DIAGNOSIS — E669 Obesity, unspecified: Secondary | ICD-10-CM | POA: Diagnosis not present

## 2018-01-02 DIAGNOSIS — Z5111 Encounter for antineoplastic chemotherapy: Secondary | ICD-10-CM | POA: Diagnosis not present

## 2018-01-02 DIAGNOSIS — I700 Atherosclerosis of aorta: Secondary | ICD-10-CM | POA: Insufficient documentation

## 2018-01-02 DIAGNOSIS — Z90411 Acquired partial absence of pancreas: Secondary | ICD-10-CM | POA: Diagnosis not present

## 2018-01-02 DIAGNOSIS — Z87891 Personal history of nicotine dependence: Secondary | ICD-10-CM | POA: Diagnosis not present

## 2018-01-02 DIAGNOSIS — D638 Anemia in other chronic diseases classified elsewhere: Secondary | ICD-10-CM | POA: Diagnosis not present

## 2018-01-02 DIAGNOSIS — E876 Hypokalemia: Secondary | ICD-10-CM | POA: Diagnosis not present

## 2018-01-02 LAB — CBC WITH DIFFERENTIAL/PLATELET
Abs Immature Granulocytes: 0.1 10*3/uL — ABNORMAL HIGH (ref 0.00–0.07)
Basophils Absolute: 0.1 10*3/uL (ref 0.0–0.1)
Basophils Relative: 0 %
Eosinophils Absolute: 0.1 10*3/uL (ref 0.0–0.5)
Eosinophils Relative: 0 %
HCT: 36.5 % (ref 36.0–46.0)
Hemoglobin: 10.9 g/dL — ABNORMAL LOW (ref 12.0–15.0)
Immature Granulocytes: 1 %
Lymphocytes Relative: 12 %
Lymphs Abs: 1.7 10*3/uL (ref 0.7–4.0)
MCH: 27.3 pg (ref 26.0–34.0)
MCHC: 29.9 g/dL — ABNORMAL LOW (ref 30.0–36.0)
MCV: 91.3 fL (ref 80.0–100.0)
Monocytes Absolute: 0.9 10*3/uL (ref 0.1–1.0)
Monocytes Relative: 6 %
Neutro Abs: 11.3 10*3/uL — ABNORMAL HIGH (ref 1.7–7.7)
Neutrophils Relative %: 81 %
Platelets: 579 10*3/uL — ABNORMAL HIGH (ref 150–400)
RBC: 4 MIL/uL (ref 3.87–5.11)
RDW: 18.5 % — ABNORMAL HIGH (ref 11.5–15.5)
WBC: 14.1 10*3/uL — ABNORMAL HIGH (ref 4.0–10.5)
nRBC: 0 % (ref 0.0–0.2)

## 2018-01-02 LAB — COMPREHENSIVE METABOLIC PANEL
ALT: 45 U/L — ABNORMAL HIGH (ref 0–44)
AST: 17 U/L (ref 15–41)
Albumin: 3.8 g/dL (ref 3.5–5.0)
Alkaline Phosphatase: 59 U/L (ref 38–126)
Anion gap: 11 (ref 5–15)
BUN: 13 mg/dL (ref 6–20)
CO2: 26 mmol/L (ref 22–32)
Calcium: 9.7 mg/dL (ref 8.9–10.3)
Chloride: 103 mmol/L (ref 98–111)
Creatinine, Ser: 0.7 mg/dL (ref 0.44–1.00)
GFR calc Af Amer: >60 mL/min (ref >60)
GFR calc non Af Amer: >60 mL/min (ref >60)
Glucose, Bld: 112 mg/dL — ABNORMAL HIGH (ref 70–99)
Potassium: 3.8 mmol/L (ref 3.5–5.1)
Sodium: 140 mmol/L (ref 135–145)
Total Bilirubin: 0.3 mg/dL (ref 0.3–1.2)
Total Protein: 7.6 g/dL (ref 6.5–8.1)

## 2018-01-02 LAB — MAGNESIUM: Magnesium: 2 mg/dL (ref 1.7–2.4)

## 2018-01-02 MED ORDER — SODIUM CHLORIDE 0.9% FLUSH
10.0000 mL | Freq: Once | INTRAVENOUS | Status: AC
  Administered 2018-01-02: 10 mL
  Filled 2018-01-02: qty 10

## 2018-01-02 MED ORDER — HEPARIN SOD (PORK) LOCK FLUSH 100 UNIT/ML IV SOLN
500.0000 [IU] | Freq: Once | INTRAVENOUS | Status: AC
  Administered 2018-01-02: 500 [IU]
  Filled 2018-01-02: qty 5

## 2018-01-02 MED ORDER — TRAMADOL HCL 50 MG PO TABS: 50.0000 mg | ORAL_TABLET | Freq: Four times a day (QID) | ORAL | 0 refills | Status: DC | PRN

## 2018-01-03 ENCOUNTER — Telehealth: Payer: Self-pay

## 2018-01-03 ENCOUNTER — Encounter: Payer: Self-pay | Admitting: Hematology

## 2018-01-03 ENCOUNTER — Inpatient Hospital Stay: Payer: BLUE CROSS/BLUE SHIELD

## 2018-01-03 ENCOUNTER — Encounter: Payer: Self-pay | Admitting: Pharmacist

## 2018-01-03 VITALS — BP 138/86 | HR 64 | Temp 98.7°F | Resp 18

## 2018-01-03 DIAGNOSIS — C7961 Secondary malignant neoplasm of right ovary: Secondary | ICD-10-CM

## 2018-01-03 DIAGNOSIS — D509 Iron deficiency anemia, unspecified: Secondary | ICD-10-CM | POA: Diagnosis not present

## 2018-01-03 DIAGNOSIS — D638 Anemia in other chronic diseases classified elsewhere: Secondary | ICD-10-CM | POA: Diagnosis not present

## 2018-01-03 DIAGNOSIS — M069 Rheumatoid arthritis, unspecified: Secondary | ICD-10-CM | POA: Diagnosis not present

## 2018-01-03 DIAGNOSIS — Z90710 Acquired absence of both cervix and uterus: Secondary | ICD-10-CM | POA: Diagnosis not present

## 2018-01-03 DIAGNOSIS — Z90411 Acquired partial absence of pancreas: Secondary | ICD-10-CM | POA: Diagnosis not present

## 2018-01-03 DIAGNOSIS — I700 Atherosclerosis of aorta: Secondary | ICD-10-CM | POA: Diagnosis not present

## 2018-01-03 DIAGNOSIS — Z90722 Acquired absence of ovaries, bilateral: Secondary | ICD-10-CM | POA: Diagnosis not present

## 2018-01-03 DIAGNOSIS — Z5111 Encounter for antineoplastic chemotherapy: Secondary | ICD-10-CM | POA: Diagnosis not present

## 2018-01-03 DIAGNOSIS — E669 Obesity, unspecified: Secondary | ICD-10-CM | POA: Diagnosis not present

## 2018-01-03 DIAGNOSIS — C541 Malignant neoplasm of endometrium: Secondary | ICD-10-CM | POA: Diagnosis not present

## 2018-01-03 DIAGNOSIS — R52 Pain, unspecified: Secondary | ICD-10-CM | POA: Diagnosis not present

## 2018-01-03 DIAGNOSIS — C252 Malignant neoplasm of tail of pancreas: Secondary | ICD-10-CM | POA: Diagnosis not present

## 2018-01-03 DIAGNOSIS — E876 Hypokalemia: Secondary | ICD-10-CM | POA: Diagnosis not present

## 2018-01-03 DIAGNOSIS — Z79899 Other long term (current) drug therapy: Secondary | ICD-10-CM | POA: Diagnosis not present

## 2018-01-03 DIAGNOSIS — E860 Dehydration: Secondary | ICD-10-CM | POA: Diagnosis not present

## 2018-01-03 LAB — CANCER ANTIGEN 19-9: CA 19-9: 5 U/mL (ref 0–35)

## 2018-01-03 MED ORDER — IRINOTECAN HCL CHEMO INJECTION 100 MG/5ML
145.0000 mg/m2 | Freq: Once | INTRAVENOUS | Status: AC
  Administered 2018-01-03: 300 mg via INTRAVENOUS
  Filled 2018-01-03: qty 15

## 2018-01-03 MED ORDER — DEXTROSE 5 % IV SOLN
Freq: Once | INTRAVENOUS | Status: AC
  Administered 2018-01-03: 14:00:00 via INTRAVENOUS
  Filled 2018-01-03: qty 250

## 2018-01-03 MED ORDER — DEXTROSE 5 % IV SOLN
Freq: Once | INTRAVENOUS | Status: AC
  Administered 2018-01-03: 10:00:00 via INTRAVENOUS
  Filled 2018-01-03: qty 250

## 2018-01-03 MED ORDER — ATROPINE SULFATE 1 MG/ML IJ SOLN
0.5000 mg | Freq: Once | INTRAMUSCULAR | Status: AC | PRN
  Administered 2018-01-03: 0.5 mg via INTRAVENOUS

## 2018-01-03 MED ORDER — SODIUM CHLORIDE 0.9 % IV SOLN: 10.0000 mg | Freq: Once | INTRAVENOUS | Status: DC

## 2018-01-03 MED ORDER — DEXAMETHASONE SODIUM PHOSPHATE 10 MG/ML IJ SOLN
INTRAMUSCULAR | Status: AC
  Filled 2018-01-03: qty 1

## 2018-01-03 MED ORDER — ALTEPLASE 2 MG IJ SOLR
INTRAMUSCULAR | Status: AC
  Filled 2018-01-03: qty 2

## 2018-01-03 MED ORDER — PALONOSETRON HCL INJECTION 0.25 MG/5ML
0.2500 mg | Freq: Once | INTRAVENOUS | Status: AC
  Administered 2018-01-03: 0.25 mg via INTRAVENOUS

## 2018-01-03 MED ORDER — SODIUM CHLORIDE 0.9 % IV SOLN
2405.0000 mg/m2 | INTRAVENOUS | Status: DC
  Administered 2018-01-03: 5000 mg via INTRAVENOUS
  Filled 2018-01-03: qty 100

## 2018-01-03 MED ORDER — ALTEPLASE 2 MG IJ SOLR
2.0000 mg | Freq: Once | INTRAMUSCULAR | Status: AC | PRN
  Administered 2018-01-03: 2 mg
  Filled 2018-01-03: qty 2

## 2018-01-03 MED ORDER — OXALIPLATIN CHEMO INJECTION 100 MG/20ML
85.0000 mg/m2 | Freq: Once | INTRAVENOUS | Status: AC
  Administered 2018-01-03: 175 mg via INTRAVENOUS
  Filled 2018-01-03: qty 35

## 2018-01-03 MED ORDER — PALONOSETRON HCL INJECTION 0.25 MG/5ML
INTRAVENOUS | Status: AC
  Filled 2018-01-03: qty 5

## 2018-01-03 MED ORDER — LEUCOVORIN CALCIUM INJECTION 350 MG
400.0000 mg/m2 | Freq: Once | INTRAVENOUS | Status: AC
  Administered 2018-01-03: 828 mg via INTRAVENOUS
  Filled 2018-01-03: qty 41.4

## 2018-01-03 MED ORDER — SODIUM CHLORIDE 0.9% FLUSH
3.0000 mL | INTRAVENOUS | Status: DC | PRN
  Filled 2018-01-03: qty 10

## 2018-01-03 MED ORDER — DEXAMETHASONE SODIUM PHOSPHATE 10 MG/ML IJ SOLN
10.0000 mg | Freq: Once | INTRAMUSCULAR | Status: AC
  Administered 2018-01-03: 10 mg via INTRAVENOUS

## 2018-01-03 MED ORDER — ATROPINE SULFATE 1 MG/ML IJ SOLN
INTRAMUSCULAR | Status: AC
  Filled 2018-01-03: qty 1

## 2018-01-03 NOTE — Progress Notes (Signed)
[place] at [facility]   Progress Note : Established Patient Follow-Up Visit  Consult was originally requested by Dr. Freda Munro   No chief complaint on file. GYN Oncologic Summary 1. 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 S/p RA TLH/BSO/Wash and resection of of culdesac implant 08/30/17 2. AND presumed Stage 2 Ovarian (Grade 2 Endometrioid) Adenocarcinoma ? S/p surgery above 08/30/17 ? Adjuvant Carbo / Taxol 09/30/17 -10/21/17 (x 2 cycles). Held additional tx due to pancreatic CA 3. Lynch Syndrome -- pathogenic mutation in MSH2 called [id number] ? Personal h/o cecal CA ? MSH2 is absent on MMR testing 4. Pancreatic lesion found to be c/w pancreatic CA ? S/p resection 11/25/2017 and now FOLFIRINOX   HPI: [name]  is a very nice 42 y.o.  P2  . Interval History Recall she started her chemotherapy with Carbo/Taxol 09/30/2017 (cycle 1, day 1).   She had a pancreatic lesion - biopsy and unfortunately that revealed pancreatic cancer. Due to this diagnosis she underwent surgery and her Carbo/Taxol was held after the second cycle. She is now on a pancreatic regimen  She was noted to have an increasing CA125, but this was soon postop from her pancreatic resection. Followup imaging ordered by Heme-Onc did not reveal any area of concern in the pelvis.    -->> Has notable family history and denied any genetic testing. Partial colectomy was 2001 and I don't see they did MMR. We have now tested her and she is in fact positive for [facility] syndrome.  Notes some bleeding per vagina versus hematuria  . Oncologic Course  Noteworthy is her h/o cecal cancer s/p right hemicolectomy 2001.  She had back pain for a few months which she thought was related to the things she had to lift at work. She went to see orthopedics and exercises were prescribed, which helped. The back pain returned and then was accompanied by  "stomach" pains. She had at the same time a MRSA infection of her thighs and was having her medications altered. She attributed the "stomach" pains to the medications. When things continued to bother her after coming off the antibiotics she followed up with GI, given her colon CA history.   GI set her up for scopes (09/01/17) upper and lower, and ordered CTimaging. Last colonoscopy ~3 years ago had "noncancerous" polyps.  CT abdomen pelvis of 08/18/2017; compared to 12/2006  Hepatobiliary - hemangioma in the right lobe of liver stable. Small amount of perihepatic ascites present. Abnormality of the tail of the pancreas - could represent a mild pancreatitis and early pseudocyst formation although pancreatic neoplasm cannot be excluded. Recommend correlation with lab values. The pancreatic duct distally may be somewhat dilated which is worrisome for malignancy.   Spleen: The spleen is unremarkable.   The colon is unremarkable the anastomosis of colon and small bowel is unremarkable with no complicating features.   The uterus is unremarkable with some low-attenuation centrally possibly related to the patient's menstrual cycle. The left adnexa is unremarkable. However there is a large mass emanating from the right adnexa measuring approximately 10.8 x 8.0 cm with mixed attenuation very worrisome for right ovarian carcinoma. Ascites layers within the pelvis.   I spoke to GI (PA) who have peripherally reviewed the above with their pancreatic specialist and they feel she should proceed with addressing the adnexa before further workup of the pancreas. Some pancreatic labs were drawn and reportedly normal so they are not  overly concerned about pancreatitis.   Patient does admit to EtOH 2-3 times daily after work. Negative for CAGE questions and states she can go for days without EtOH.  Given the adnexal mass she was referred to Dr. Ouida Sills who ordered CA125 and CEA and referred her here.   In addition to  the above the patient's last 2 menstrual cycles were heavier and longer than normal. No intermenstrual bleeding. She does have some nausea, no vomiting. Has loss of appetite, some GI changes (diarrhea), bloating and still the back pain. Noting subjective fevers, but when checks temperature she is 99. Had night sweats last night. Is noting some hot flashes.  She underwent Robotic assisted Hysterectomy/BSO/Washings and resection of a peritoneal (culdesac) implant on 08/30/17. There were no perioperative complications.  Frozen section revealed the uterine cancer, however the ovarian lesion was adenocarcinoma, favor GI primary Final pathology revealed: 1. Uterus and cervix, with left fallopian tube - ENDOMETRIOID ADENOCARCINOMA, FIGO GRADE I, ARISING IN A BACKGROUND OF DIFFUSE COMPLEX ATYPICAL HYPERPLASIA. - CARCINOMA INVADES FOR OF DEPTH OF 0.2 CM WHERE THICKNESS OF MYOMETRIAL WALL IS 2.1 CM. - ALL RESECTION MARGINS ARE NEGATIVE FOR CARCINOMA. - NEGATIVE FOR LYMPHOVASCULAR OR PERINEURAL INVASION. - CERVICAL STROMA IS NOT INVOLVED. - SEE ONCOLOGY TABLE. - SEE NOTE 2. Ovary and fallopian tube, right - PRIMARY OVARIAN ENDOMETRIOID ADENOCARCINOMA, FIGO GRADE II, 12 CM. - THE OVARIAN SURFACE IS FOCALLY INVOLVED BY CARCINOMA. - NEGATIVE FOR LYMPHOVASCULAR INVASION. - BENIGN UNREMARKABLE FALLOPIAN TUBE, NEGATIVE FOR CARCINOMA. - SEE ONCOLOGY TABLE. - SEE NOTE 3. Cul-de-sac biopsy - METASTATIC ADENOCARCINOMA, MOST CONSISTENT WITH PRIMARY OVARIAN ENDOMETRIOID ADENOCARCINOMA. 4. Ovary, left - BENIGN UNREMARKABLE OVARY, NEGATIVE FOR MALIGNANCY. Washings negative Chest imaging negative  Thus she is a presumed Stage 2 Ovarian (Grade 2 Endometrioid) Adenocarcinoma And Stage IA, Grade 1 Endometrioid Uterine Adenocarcinoma   Imported EPIC Oncologic History:  See chart for details  Measurement of disease:  CA125  08/23/17 = 139.8 Recent Labs    09/27/17 1054 11/02/17 1057 11/07/17 0917  12/15/17 1055  CAN125 39.4* 19.0 18.3 57.3*    CEA  08/23/17 = 1.1  CA19-9 will be drawn today 08/24/17 . 08/24/17 = 14  Radiology: Dg Chest 2 View  Result Date: 11/02/2017 CLINICAL DATA:  Ovarian carcinoma.  Fever following chemotherapy EXAM: CHEST - 2 VIEW COMPARISON:  Chest radiograph October 08, 2017 and chest CT October 09, 2017 FINDINGS: Port-A-Cath tip is in the superior vena cava near the cavoatrial junction. No pneumothorax. Lungs are clear. Heart size and pulmonary vascularity are normal. No adenopathy. No bone lesions. IMPRESSION: No edema or consolidation.  No evident adenopathy. Electronically Signed   By: Lowella Grip III M.D.   On: 11/02/2017 10:50   Dg Chest 2 View  Result Date: 10/09/2017 CLINICAL DATA:  Initial evaluation for acute chest pain. EXAM: CHEST - 2 VIEW COMPARISON:  Prior radiograph from 02/20/2007. FINDINGS: Right-sided Port-A-Cath in place with tip overlying the cavoatrial junction. Cardiac and mediastinal silhouettes within normal limits. Lungs normally inflated. No focal infiltrates. No pulmonary edema or pleural effusion. No pneumothorax. No acute osseous abnormality. IMPRESSION: No active cardiopulmonary disease. Electronically Signed   By: Jeannine Boga M.D.   On: 10/09/2017 00:11   Ct Angio Chest Pe W Or Wo Contrast  Result Date: 10/09/2017 CLINICAL DATA:  Chest pain. Chemotherapy for endometrial cancer. Left-sided chest pain for 3 days. EXAM: CT ANGIOGRAPHY CHEST WITH CONTRAST TECHNIQUE: Multidetector CT imaging of the chest was performed using the standard protocol during bolus  administration of intravenous contrast. Multiplanar CT image reconstructions and MIPs were obtained to evaluate the vascular anatomy. CONTRAST:  174m ISOVUE-370 IOPAMIDOL (ISOVUE-370) INJECTION 76% COMPARISON:  09/27/2017 FINDINGS: Cardiovascular: Good opacification of the central and segmental pulmonary arteries. No focal filling defects. No evidence of significant pulmonary  embolus. Normal heart size. No pericardial effusion. Normal caliber thoracic aorta. No evidence of dissection. Great vessel origins are patent. Mediastinum/Nodes: Esophagus is decompressed. No significant lymphadenopathy in the chest. Thyroid gland is unremarkable. Lungs/Pleura: Mild dependent changes in the lungs. Lungs are otherwise clear and expanded. No pleural effusions. No pneumothorax. Airways are patent. Upper Abdomen: Focal fullness at the junction of the body and tail of the pancreas with surrounding infiltration. This is suspicious for pancreatic neoplasm, measuring about 2.7 cm diameter. Similar changes present on previous study. Tissue sampling was recommended at that study. Possible small lymph nodes in the celiac axis. Musculoskeletal: No destructive bone lesions. Review of the MIP images confirms the above findings. IMPRESSION: 1. No evidence of significant pulmonary embolus. 2. No evidence of active pulmonary disease. 3. Focal mass at the junction of the body and tail of the pancreas with surrounding infiltration. This is suspicious for pancreatic neoplasm. Tissue sampling was recommended on previous study. Electronically Signed   By: WLucienne CapersM.D.   On: 10/09/2017 03:09   Ct Abdomen Pelvis W Contrast  Result Date: 12/28/2017 CLINICAL DATA:  History of colon cancer, ovarian cancer, uterine cancer, and pancreatic cancer. Lynch syndrome. Prior partial pancreatectomy and splenectomy. Restaging assessment. EXAM: CT ABDOMEN AND PELVIS WITH CONTRAST TECHNIQUE: Multidetector CT imaging of the abdomen and pelvis was performed using the standard protocol following bolus administration of intravenous contrast. CONTRAST:  1067mOMNIPAQUE IOHEXOL 300 MG/ML  SOLN COMPARISON:  11/07/2017 FINDINGS: Lower chest: Asymmetric fullness of left inferior breast tissues, image 9/2. Mild scarring or atelectasis in the right middle lobe and anteriorly in the right lower lobe. Hepatobiliary: Stable hemangioma in  segment IVa of the liver. Cholecystectomy. Pancreas: Partial pancreatectomy. A cystic lesion measuring approximately 4.7 by 2.8 by 3.1 cm (volume = 21 cm^3)tracks along and below the pancreatic resection margin. Spleen: Splenectomy Adrenals/Urinary Tract: Nodularity along or within the lateral limb of the left adrenal gland measuring 1.0 by 1.4 cm on image 43/7. 3 mm left kidney lower pole nonobstructive renal calculus, image 52/3. 2 mm right kidney lower pole nonobstructive renal calculus, image 51/3. Urinary bladder unremarkable. Stomach/Bowel: Right hemicolectomy. Vascular/Lymphatic: Aortoiliac atherosclerotic vascular disease. Reproductive: Uterus absent. Adnexa unremarkable. Ovaries not well seen. Other: Increased stranding in the omentum likely from recent surgery. Laparotomy site noted. Small focus of suspected subcutaneous fat necrosis along the right side of the laparotomy site, this resembles herniated adipose tissue but I do not see a definite hernia neck. Musculoskeletal: Unremarkable IMPRESSION: 1. Postoperative findings from recent partial pancreatectomy including a 21 cubic cm fluid collection along the pancreatic resection margin which could represent early pseudocyst. 2. Nodularity along the lateral limb of the left adrenal gland could also be postoperative but merit surveillance, as the pancreatic lesion was in close proximity to this adrenal gland on the prior CT of 11/07/2017. 3. Asymmetric fullness inferiorly in the left breast. The patient has a history of prior left breast procedures, correlation with mammography is recommended. 4. Other imaging findings of potential clinical significance: Stable hemangioma in the left hepatic lobe. Splenectomy. Aortic Atherosclerosis (ICD10-I70.0). Right hemicolectomy. Small focus of fat necrosis in the right anterior abdominal wall subcutaneous tissues near the laparotomy site. Bilateral nonobstructive nephrolithiasis.  Electronically Signed   By: Van Clines M.D.   On: 12/28/2017 09:38   Ct Abdomen Pelvis W Contrast  Result Date: 11/07/2017 CLINICAL DATA:  Right hemicolectomy for colon cancer in 2001. Appendectomy and chemotherapy. Pancreatic neoplasm diagnosed 7/19. Chemotherapy started 8/19. Known gallstones. EXAM: CT ABDOMEN AND PELVIS WITH CONTRAST TECHNIQUE: Multidetector CT imaging of the abdomen and pelvis was performed using the standard protocol following bolus administration of intravenous contrast. CONTRAST:  181m OMNIPAQUE IOHEXOL 300 MG/ML  SOLN COMPARISON:  09/16/2017 MRI.  CT 08/18/2017. FINDINGS: Lower chest: Clear lung bases. Normal heart size without pericardial or pleural effusion. Hepatobiliary: Left hepatic lobe hemangioma. No suspicious liver lesion. Normal gallbladder, without biliary ductal dilatation. Pancreas: The pancreatic head and neck are unremarkable. Again identified is vague hypoattenuation involving the pancreatic body with mild upstream atrophy. Somewhat difficult to quantify secondary to ill definition. Measures on the order of 3.0 x 2.3 cm on image 23/2. Most directly compared to 08/18/2017, where it measured 3.5 x 2.1 cm (when remeasured). There is mild peripancreatic edema, slightly improved. Spleen: Normal in size, without focal abnormality. Adrenals/Urinary Tract: Normal adrenal glands. 5 mm inter/lower pole left renal collecting system calculus. No hydronephrosis. Normal urinary bladder. Stomach/Bowel: Normal stomach, without wall thickening. Right hemicolectomy. Normal small bowel. Vascular/Lymphatic: Aortic atherosclerosis. Splenic vein involvement by tumor with gastroepiploic collaterals. No abdominopelvic adenopathy. Reproductive: Hysterectomy.  No adnexal mass. Other: No significant free fluid. No evidence of omental or peritoneal disease. Musculoskeletal: No acute osseous abnormality. IMPRESSION: 1. Since 08/18/2017, similar to slight decrease in size of a pancreatic body/tail junction lesion. Cross  modality comparison relative to 09/16/2017 MRI is also grossly similar. 2. No evidence of metastatic disease. 3.  Aortic Atherosclerosis (ICD10-I70.0). 4. Left nephrolithiasis. Electronically Signed   By: KAbigail MiyamotoM.D.   On: 11/07/2017 13:57   Vas UKoreaLower Extremity Venous (dvt)  Result Date: 11/21/2017  Lower Venous Study Indications: Pain. Other Indications: Pre-op. Risk Factors: Obesity. Performing Technologist: VToma CopierRVS  Examination Guidelines: A complete evaluation includes B-mode imaging, spectral Doppler, color Doppler, and power Doppler as needed of all accessible portions of each vessel. Bilateral testing is considered an integral part of a complete examination. Limited examinations for reoccurring indications may be performed as noted.  Right Venous Findings: +---------+---------------+---------+-----------+----------+-------------------+          CompressibilityPhasicitySpontaneityPropertiesSummary             +---------+---------------+---------+-----------+----------+-------------------+ CFV      Full           Yes      Yes                                      +---------+---------------+---------+-----------+----------+-------------------+ SFJ      Full                                                             +---------+---------------+---------+-----------+----------+-------------------+ FV Prox  Full           Yes      Yes                                      +---------+---------------+---------+-----------+----------+-------------------+  FV Mid   Full                                                             +---------+---------------+---------+-----------+----------+-------------------+ FV DistalFull           Yes      Yes                                      +---------+---------------+---------+-----------+----------+-------------------+ PFV      Full           Yes      Yes                                       +---------+---------------+---------+-----------+----------+-------------------+ POP      Full           Yes      Yes                                      +---------+---------------+---------+-----------+----------+-------------------+ PTV      Full                                                             +---------+---------------+---------+-----------+----------+-------------------+ PERO     Full                                         difficult to image                                                        due to body habitus +---------+---------------+---------+-----------+----------+-------------------+  Left Venous Findings: +---+---------------+---------+-----------+----------+-------+    CompressibilityPhasicitySpontaneityPropertiesSummary +---+---------------+---------+-----------+----------+-------+ CFVFull           Yes      Yes                          +---+---------------+---------+-----------+----------+-------+ SFJFull                                                 +---+---------------+---------+-----------+----------+-------+    Final Interpretation: Right: There is no evidence of deep vein thrombosis in the lower extremity. No cystic structure found in the popliteal fossa. Left: There is no evidence of a common femoral vein obstruction  *See table(s) above for measurements and observations. Electronically signed by Ruta Hinds MD on 11/21/2017 at 7:23:56 PM.    Final       Current Meds:  Outpatient Encounter  Medications as of 01/04/2018  Medication Sig  . acetaminophen (TYLENOL) 650 MG CR tablet Take 1,300 mg by mouth every 8 (eight) hours.  Marland Kitchen HYDROmorphone (DILAUDID) 4 MG tablet Take 1 tablet (4 mg total) by mouth every 4 (four) hours as needed for severe pain.  Marland Kitchen lidocaine-prilocaine (EMLA) cream Apply to affected area once (Patient taking differently: Apply 1 application topically daily as needed (port). Apply to affected area  once)  . loratadine (CLARITIN) 10 MG tablet Take 10 mg by mouth daily after breakfast.  . naproxen sodium (ALEVE) 220 MG tablet Take 220 mg by mouth.  Marland Kitchen omeprazole (PRILOSEC) 20 MG capsule Take 20 mg by mouth 2 (two) times daily before a meal.   . ondansetron (ZOFRAN) 8 MG tablet Take 1 tablet (8 mg total) by mouth every 8 (eight) hours as needed for refractory nausea / vomiting. Start on day 3 after chemo.  . predniSONE (DELTASONE) 10 MG tablet TAKE 1 TABLET BY MOUTH EVERY DAY WITH BREAKFAST (Patient taking differently: Take 5 mg by mouth daily with breakfast. )  . predniSONE (DELTASONE) 5 MG tablet Take 1 tablet (5 mg total) by mouth daily with breakfast.  . prochlorperazine (COMPAZINE) 10 MG tablet Take 1 tablet (10 mg total) by mouth every 6 (six) hours as needed (Nausea or vomiting).  . simethicone (MYLICON) 017 MG chewable tablet Chew 125 mg by mouth every 6 (six) hours as needed for flatulence.  . traMADol (ULTRAM) 50 MG tablet Take 1-2 tablets (50-100 mg total) by mouth every 6 (six) hours as needed.   No facility-administered encounter medications on file as of 01/04/2018.     Allergy:  Allergies  Allergen Reactions  . Codeine Other (See Comments)    headaches  . Penicillins     Severe headaches Has patient had a PCN reaction causing immediate rash, facial/tongue/throat swelling, SOB or lightheadedness with hypotension: No Has patient had a PCN reaction causing severe rash involving mucus membranes or skin necrosis: No Has patient had a PCN reaction that required hospitalization: No Has patient had a PCN reaction occurring within the last 10 years: No If all of the above answers are "NO", then may proceed with Cephalosporin use.   . Bactrim [Sulfamethoxazole-Trimethoprim] Rash    Past Surgical Hx:  Past Surgical History:  Procedure Laterality Date  . BREAST LUMPECTOMY WITH RADIOACTIVE SEED LOCALIZATION Left 06/28/2014   Benign Procedure: RADIOACTIVE SEED LOCALIZATION LEFT  BREAST LUMPECTOMY;  Surgeon: Excell Seltzer, MD;  Location: [facility];  Service: General;  Laterality: Left;  . COLONOSCOPY  06/19/14  . EYE SURGERY Left    plug in tear duct  . IR IMAGING GUIDED PORT INSERTION  09/26/2017  . KNEE ARTHROSCOPY    . PANCREATECTOMY  11/25/2017  . PORT A CATH REVISION  2001   in and out  . RIGHT COLECTOMY  06/02/2009   cecum cancer  . ROBOTIC ASSISTED TOTAL HYSTERECTOMY WITH BILATERAL SALPINGO OOPHERECTOMY Bilateral 08/30/2017   Procedure: XI ROBOTIC ASSISTED TOTAL  HYSTERECTOMY BILATERAL  SALPINGO OOPHORECTOMY; LYSIS OF ADHESIONS;  Surgeon: Isabel Caprice, MD;  Location: [facility];  Service: Gynecology;  Laterality: Bilateral;  . SPLENECTOMY, PARTIAL  11/25/2017    Past Medical Hx:  Past Medical History:  Diagnosis Date  . Allergic rhinitis, seasonal   . Cecal cancer ([place]) 2001   Stage II (T3N0)  04-11-2001cecum-partial colectomy and completed chemo 2002  . Endometrial cancer ([place]) 08/2017   Stage IA, Grade 1  . History of MRSA infection  01/2017   followed by infectious disease center--  recurrent pustular folliculitis  . Nephrolithiasis    bilateral nonobstructive calculi per CT 08-18-2017  . OA (osteoarthritis)   . Ovarian cancer, right ([place]) 08/2017   Stage II Grade 2 Endometrioid  . Pancreatic cancer ([place])   . Rheumatoid arthritis [facility])    rheumatologist-  dr devenswar-- treated w/ oral prednisone daily and methotrexate injection every 3 wks    Past Gynecological History:   GYNECOLOGIC HISTORY:  Patient's last menstrual period was 08/30/2017. Menarche: 42 years old P 2 LMP 08/04/2017 - see HPI Contraceptive >5 yrs OCP HRT none  Last Pap 03/2017 neg (no HPV done)  Family Hx:  Family History  Problem Relation Age of Onset  . Arthritis/Rheumatoid Mother   . Uterine cancer Mother 20  . Allergic rhinitis Father   . Heart disease Father   . Drug abuse Son   . Other Maternal Aunt        Unknown GYN CA  . Colon cancer  Maternal Uncle 4  . Diabetes Maternal Grandmother   . Cancer Maternal Grandfather        d. 77s of liver/lung cancer  . Angioedema Neg Hx   . Asthma Neg Hx   . Atopy Neg Hx   . Eczema Neg Hx   . Immunodeficiency Neg Hx   . Urticaria Neg Hx     Social Hx:  Tobacco use: Former smoker 19 years. Quit 1997 Alcohol use: 2-3 EtOH per day. Illicit Drug use: none Illicit IV Drug use: none  Review of Systems:  Review of Systems  Constitutional: Positive for appetite change.  Respiratory: Positive for shortness of breath.   Gastrointestinal: Positive for constipation and diarrhea.  Endocrine: Positive for hot flashes.  Genitourinary: Positive for dysuria.   Musculoskeletal: Positive for arthralgias, back pain and gait problem.  Neurological: Positive for gait problem.  All other systems reviewed and are negative.   Vitals:  Blood pressure (!) 142/90, pulse 64, temperature 98.6 F (37 C), temperature source Oral, resp. rate 18, height _0  (1.575 m), weight 214 lb 11.2 oz (97.4 kg), last menstrual period 08/30/2017, SpO2 100 %. Body mass index is 39.27 kg/m.   Physical Exam:  General :  Well developed, 42 y.o., female in no apparent distress HEENT:  Normocephalic/atraumatic, symmetric, EOMI, eyelids normal Neck:   No visible masses.  Respiratory:  Respirations unlabored, no use of accessory muscles CV:   Deferred Breast:  Deferred Musculoskeletal: Normal muscle strength. Abdomen:  Wounds CDI.  No visible masses or protrusion Extremities:  No visible edema or deformities Skin:   Normal inspection Neuro/Psych:  No focal motor deficit, no abnormal mental status. Normal gait. Normal affect. Alert and oriented to person, place, and time  Genito Urinary: Vulva: Normal external female genitalia.  Bladder/urethra: Urethral meatus normal in size and location. No lesions or   masses, well supported bladder Speculum exam: Vagina: No lesion, no discharge. Small amount of old blood in  the vault. No defect or lesion seen though on detailed inspection. Bimanual exam: Cervix/Uterus/Adnexa: Surgically absent  Adnexa: No masses.  Rectovaginal:  No masses, normal tone.    Assessment: Stage IA, G1 EM Uterine CA Stage 2 G2 Ovarian CA (endometrioid type) Personal h/o cecal cancer Pancreatic cancer  Plan: 1. Endometrial cancer o No further treatment based on her low risk for recurrence (GOG 99 factors including DOI, Grade, and LVSI status) o Surveillance plan i. RTC Q3 months for pelvic  ii. Annual Pap  every July +/- since she will not be receiving adjuvant radiation therapy and the vaginal cuff is the site at greatest risk for recurrence. 2. Ovarian cancer o Chemotherapy with Carbo/Taxol was held after Cycle 2 per Dr. Alvy Bimler due to her pancreatic CA diagnosis i. However, she is receiving Oxaliplatin as part of the pancreatic regimen o Surveillance will be pelvic and CA125 Q 3 month x 73yr. o Imaging will follow plan for pancreatic CA o I suspect the elevated CA125 was due to being immediately (3 weeks) postop from the pancreatic resection. I would wait until she is 8 weeks from that surgery before checking. So I went ahead and ordered a repeat for early December 2019. 3. Pancreatic CA per Medical Oncology 4. Genetics / LYNCH syndrome o She will need GI followup when she has completed her pancreatic treatment. o Any other screening tests per Dr. GAlvy Bimlerand genetics.  Face to face time with patient was 25 minutes. Over 50% of this time was spent on counseling and coordination of care.   SIsabel Caprice MD  01/04/2018, 6:31 PM    Cc: MFreda Munro MD (Referring Ob/Gyn) ALondon Pepper MD (PCP) JLaurence Spates MD (GI) Y. FBurr Medicoin for NHeath Lark MD (Med Onc)

## 2018-01-03 NOTE — Patient Instructions (Signed)
[facility] Discharge Instructions for Patients Receiving Chemotherapy  Today you received the following chemotherapy agents Oxaliplatin, Irinotecan, Leucovorin, 5FU.  To help prevent nausea and vomiting after your treatment, we encourage you to take your nausea medication.     If you develop nausea and vomiting that is not controlled by your nausea medication, call the clinic.   BELOW ARE SYMPTOMS THAT SHOULD BE REPORTED IMMEDIATELY:  *FEVER GREATER THAN 100.5 F  *CHILLS WITH OR WITHOUT FEVER  NAUSEA AND VOMITING THAT IS NOT CONTROLLED WITH YOUR NAUSEA MEDICATION  *UNUSUAL SHORTNESS OF BREATH  *UNUSUAL BRUISING OR BLEEDING  TENDERNESS IN MOUTH AND THROAT WITH OR WITHOUT PRESENCE OF ULCERS  *URINARY PROBLEMS  *BOWEL PROBLEMS  UNUSUAL RASH Items with * indicate a potential emergency and should be followed up as soon as possible.  Feel free to call the clinic should you have any questions or concerns. The clinic phone number is [phone].  Please show the [facility] at check-in to the Emergency Department and triage nurse.  Oxaliplatin Injection What is this medicine? OXALIPLATIN (ox AL i PLA tin) is a chemotherapy drug. It targets fast dividing cells, like cancer cells, and causes these cells to die. This medicine is used to treat cancers of the colon and rectum, and many other cancers. This medicine may be used for other purposes; ask your health care provider or pharmacist if you have questions. COMMON BRAND NAME(S): Eloxatin What should I tell my health care provider before I take this medicine? They need to know if you have any of these conditions: -kidney disease -an unusual or allergic reaction to oxaliplatin, other chemotherapy, other medicines, foods, dyes, or preservatives -pregnant or trying to get pregnant -breast-feeding How should I use this medicine? This drug is given as an infusion into a vein. It is administered in a hospital or  clinic by a specially trained health care professional. Talk to your pediatrician regarding the use of this medicine in children. Special care may be needed. Overdosage: If you think you have taken too much of this medicine contact a poison control center or emergency room at once. NOTE: This medicine is only for you. Do not share this medicine with others. What if I miss a dose? It is important not to miss a dose. Call your doctor or health care professional if you are unable to keep an appointment. What may interact with this medicine? -medicines to increase blood counts like filgrastim, pegfilgrastim, sargramostim -probenecid -some antibiotics like amikacin, gentamicin, neomycin, polymyxin B, streptomycin, tobramycin -zalcitabine Talk to your doctor or health care professional before taking any of these medicines: -acetaminophen -aspirin -ibuprofen -ketoprofen -naproxen This list may not describe all possible interactions. Give your health care provider a list of all the medicines, herbs, non-prescription drugs, or dietary supplements you use. Also tell them if you smoke, drink alcohol, or use illegal drugs. Some items may interact with your medicine. What should I watch for while using this medicine? Your condition will be monitored carefully while you are receiving this medicine. You will need important blood work done while you are taking this medicine. This medicine can make you more sensitive to cold. Do not drink cold drinks or use ice. Cover exposed skin before coming in contact with cold temperatures or cold objects. When out in cold weather wear warm clothing and cover your mouth and nose to warm the air that goes into your lungs. Tell your doctor if you get sensitive to the cold.  This drug may make you feel generally unwell. This is not uncommon, as chemotherapy can affect healthy cells as well as cancer cells. Report any side effects. Continue your course of treatment even though  you feel ill unless your doctor tells you to stop. In some cases, you may be given additional medicines to help with side effects. Follow all directions for their use. Call your doctor or health care professional for advice if you get a fever, chills or sore throat, or other symptoms of a cold or flu. Do not treat yourself. This drug decreases your body's ability to fight infections. Try to avoid being around people who are sick. This medicine may increase your risk to bruise or bleed. Call your doctor or health care professional if you notice any unusual bleeding. Be careful brushing and flossing your teeth or using a toothpick because you may get an infection or bleed more easily. If you have any dental work done, tell your dentist you are receiving this medicine. Avoid taking products that contain aspirin, acetaminophen, ibuprofen, naproxen, or ketoprofen unless instructed by your doctor. These medicines may hide a fever. Do not become pregnant while taking this medicine. Women should inform their doctor if they wish to become pregnant or think they might be pregnant. There is a potential for serious side effects to an unborn child. Talk to your health care professional or pharmacist for more information. Do not breast-feed an infant while taking this medicine. Call your doctor or health care professional if you get diarrhea. Do not treat yourself. What side effects may I notice from receiving this medicine? Side effects that you should report to your doctor or health care professional as soon as possible: -allergic reactions like skin rash, itching or hives, swelling of the face, lips, or tongue -low blood counts - This drug may decrease the number of white blood cells, red blood cells and platelets. You may be at increased risk for infections and bleeding. -signs of infection - fever or chills, cough, sore throat, pain or difficulty passing urine -signs of decreased platelets or bleeding -  bruising, pinpoint red spots on the skin, black, tarry stools, nosebleeds -signs of decreased red blood cells - unusually weak or tired, fainting spells, lightheadedness -breathing problems -chest pain, pressure -cough -diarrhea -jaw tightness -mouth sores -nausea and vomiting -pain, swelling, redness or irritation at the injection site -pain, tingling, numbness in the hands or feet -problems with balance, talking, walking -redness, blistering, peeling or loosening of the skin, including inside the mouth -trouble passing urine or change in the amount of urine Side effects that usually do not require medical attention (report to your doctor or health care professional if they continue or are bothersome): -changes in vision -constipation -hair loss -loss of appetite -metallic taste in the mouth or changes in taste -stomach pain This list may not describe all possible side effects. Call your doctor for medical advice about side effects. You may report side effects to FDA at 1-800-FDA-1088. Where should I keep my medicine? This drug is given in a hospital or clinic and will not be stored at home. NOTE: This sheet is a summary. It may not cover all possible information. If you have questions about this medicine, talk to your doctor, pharmacist, or health care provider.  2018 Elsevier/Gold Standard (2007-09-05 17:22:47)   Irinotecan injection What is this medicine? IRINOTECAN (ir in oh TEE kan ) is a chemotherapy drug. It is used to treat colon and rectal cancer. This medicine  may be used for other purposes; ask your health care provider or pharmacist if you have questions. COMMON BRAND NAME(S): Camptosar What should I tell my health care provider before I take this medicine? They need to know if you have any of these conditions: -blood disorders -dehydration -diarrhea -infection (especially a virus infection such as chickenpox, cold sores, or herpes) -liver disease -low blood  counts, like low white cell, platelet, or red cell counts -recent or ongoing radiation therapy -an unusual or allergic reaction to irinotecan, sorbitol, other chemotherapy, other medicines, foods, dyes, or preservatives -pregnant or trying to get pregnant -breast-feeding How should I use this medicine? This drug is given as an infusion into a vein. It is administered in a hospital or clinic by a specially trained health care professional. Talk to your pediatrician regarding the use of this medicine in children. Special care may be needed. Overdosage: If you think you have taken too much of this medicine contact a poison control center or emergency room at once. NOTE: This medicine is only for you. Do not share this medicine with others. What if I miss a dose? It is important not to miss your dose. Call your doctor or health care professional if you are unable to keep an appointment. What may interact with this medicine? Do not take this medicine with any of the following medications: -atazanavir -certain medicines for fungal infections like itraconazole and ketoconazole -St. John's Wort This medicine may also interact with the following medications: -dexamethasone -diuretics -laxatives -medicines for seizures like carbamazepine, mephobarbital, phenobarbital, phenytoin, primidone -medicines to increase blood counts like filgrastim, pegfilgrastim, sargramostim -prochlorperazine -vaccines This list may not describe all possible interactions. Give your health care provider a list of all the medicines, herbs, non-prescription drugs, or dietary supplements you use. Also tell them if you smoke, drink alcohol, or use illegal drugs. Some items may interact with your medicine. What should I watch for while using this medicine? Your condition will be monitored carefully while you are receiving this medicine. You will need important blood work done while you are taking this medicine. This drug may  make you feel generally unwell. This is not uncommon, as chemotherapy can affect healthy cells as well as cancer cells. Report any side effects. Continue your course of treatment even though you feel ill unless your doctor tells you to stop. In some cases, you may be given additional medicines to help with side effects. Follow all directions for their use. You may get drowsy or dizzy. Do not drive, use machinery, or do anything that needs mental alertness until you know how this medicine affects you. Do not stand or sit up quickly, especially if you are an older patient. This reduces the risk of dizzy or fainting spells. Call your doctor or health care professional for advice if you get a fever, chills or sore throat, or other symptoms of a cold or flu. Do not treat yourself. This drug decreases your body's ability to fight infections. Try to avoid being around people who are sick. This medicine may increase your risk to bruise or bleed. Call your doctor or health care professional if you notice any unusual bleeding. Be careful brushing and flossing your teeth or using a toothpick because you may get an infection or bleed more easily. If you have any dental work done, tell your dentist you are receiving this medicine. Avoid taking products that contain aspirin, acetaminophen, ibuprofen, naproxen, or ketoprofen unless instructed by your doctor. These medicines may hide  a fever. Do not become pregnant while taking this medicine. Women should inform their doctor if they wish to become pregnant or think they might be pregnant. There is a potential for serious side effects to an unborn child. Talk to your health care professional or pharmacist for more information. Do not breast-feed an infant while taking this medicine. What side effects may I notice from receiving this medicine? Side effects that you should report to your doctor or health care professional as soon as possible: -allergic reactions like skin  rash, itching or hives, swelling of the face, lips, or tongue -low blood counts - this medicine may decrease the number of white blood cells, red blood cells and platelets. You may be at increased risk for infections and bleeding. -signs of infection - fever or chills, cough, sore throat, pain or difficulty passing urine -signs of decreased platelets or bleeding - bruising, pinpoint red spots on the skin, black, tarry stools, blood in the urine -signs of decreased red blood cells - unusually weak or tired, fainting spells, lightheadedness -breathing problems -chest pain -diarrhea -feeling faint or lightheaded, falls -flushing, runny nose, sweating during infusion -mouth sores or pain -pain, swelling, redness or irritation where injected -pain, swelling, warmth in the leg -pain, tingling, numbness in the hands or feet -problems with balance, talking, walking -stomach cramps, pain -trouble passing urine or change in the amount of urine -vomiting as to be unable to hold down drinks or food -yellowing of the eyes or skin Side effects that usually do not require medical attention (report to your doctor or health care professional if they continue or are bothersome): -constipation -hair loss -headache -loss of appetite -nausea, vomiting -stomach upset This list may not describe all possible side effects. Call your doctor for medical advice about side effects. You may report side effects to FDA at 1-800-FDA-1088. Where should I keep my medicine? This drug is given in a hospital or clinic and will not be stored at home. NOTE: This sheet is a summary. It may not cover all possible information. If you have questions about this medicine, talk to your doctor, pharmacist, or health care provider.  2018 Elsevier/Gold Standard (2012-08-07 16:29:32)  Leucovorin injection What is this medicine? LEUCOVORIN (loo koe VOR in) is used to prevent or treat the harmful effects of some medicines. This  medicine is used to treat anemia caused by a low amount of folic acid in the body. It is also used with 5-fluorouracil (5-FU) to treat colon cancer. This medicine may be used for other purposes; ask your health care provider or pharmacist if you have questions. What should I tell my health care provider before I take this medicine? They need to know if you have any of these conditions: -anemia from low levels of vitamin B-12 in the blood -an unusual or allergic reaction to leucovorin, folic acid, other medicines, foods, dyes, or preservatives -pregnant or trying to get pregnant -breast-feeding How should I use this medicine? This medicine is for injection into a muscle or into a vein. It is given by a health care professional in a hospital or clinic setting. Talk to your pediatrician regarding the use of this medicine in children. Special care may be needed. Overdosage: If you think you have taken too much of this medicine contact a poison control center or emergency room at once. NOTE: This medicine is only for you. Do not share this medicine with others. What if I miss a dose? This does not apply. What  may interact with this medicine? -capecitabine -fluorouracil -phenobarbital -phenytoin -primidone -trimethoprim-sulfamethoxazole This list may not describe all possible interactions. Give your health care provider a list of all the medicines, herbs, non-prescription drugs, or dietary supplements you use. Also tell them if you smoke, drink alcohol, or use illegal drugs. Some items may interact with your medicine. What should I watch for while using this medicine? Your condition will be monitored carefully while you are receiving this medicine. This medicine may increase the side effects of 5-fluorouracil, 5-FU. Tell your doctor or health care professional if you have diarrhea or mouth sores that do not get better or that get worse. What side effects may I notice from receiving this  medicine? Side effects that you should report to your doctor or health care professional as soon as possible: -allergic reactions like skin rash, itching or hives, swelling of the face, lips, or tongue -breathing problems -fever, infection -mouth sores -unusual bleeding or bruising -unusually weak or tired Side effects that usually do not require medical attention (report to your doctor or health care professional if they continue or are bothersome): -constipation or diarrhea -loss of appetite -nausea, vomiting This list may not describe all possible side effects. Call your doctor for medical advice about side effects. You may report side effects to FDA at 1-800-FDA-1088. Where should I keep my medicine? This drug is given in a hospital or clinic and will not be stored at home. NOTE: This sheet is a summary. It may not cover all possible information. If you have questions about this medicine, talk to your doctor, pharmacist, or health care provider.  2018 Elsevier/Gold Standard (2007-08-15 16:50:29)  Fluorouracil, 5FU; Diclofenac topical cream What is this medicine? FLUOROURACIL; DICLOFENAC (flure oh YOOR a sil; dye KLOE fen ak) is a combination of a topical chemotherapy agent and non-steroidal anti-inflammatory drug (NSAID). It is used on the skin to treat skin cancer and skin conditions that could become cancer. This medicine may be used for other purposes; ask your health care provider or pharmacist if you have questions. COMMON BRAND NAME(S): FLUORAC What should I tell my health care provider before I take this medicine? They need to know if you have any of these conditions: -bleeding problems -cigarette smoker -DPD enzyme deficiency -heart disease -high blood pressure -if you frequently drink alcohol containing drinks -kidney disease -liver disease -open or infected skin -stomach problems -swelling or open sores at the treatment site -recent or planned coronary artery bypass  graft (CABG) surgery -an unusual or allergic reaction to fluorouracil, diclofenac, aspirin, other NSAIDs, other medicines, foods, dyes, or preservatives -pregnant or trying to get pregnant -breast-feeding How should I use this medicine? This medicine is only for use on the skin. Follow the directions on the prescription label. Wash hands before and after use. Wash affected area and gently pat dry. To apply this medicine use a cotton-tipped applicator, or use gloves if applying with fingertips. If applied with unprotected fingertips, it is very important to wash your hands well after you apply this medicine. Avoid applying to the eyes, nose, or mouth. Apply enough medicine to cover the affected area. You can cover the area with a light gauze dressing, but do not use tight or air-tight dressings. Finish the full course prescribed by your doctor or health care professional, even if you think your condition is better. Do not stop taking except on the advice of your doctor or health care professional. Talk to your pediatrician regarding the use of this medicine  in children. Special care may be needed. Overdosage: If you think you have taken too much of this medicine contact a poison control center or emergency room at once. NOTE: This medicine is only for you. Do not share this medicine with others. What if I miss a dose? If you miss a dose, apply it as soon as you can. If it is almost time for your next dose, only use that dose. Do not apply extra doses. Contact your doctor or health care professional if you miss more than one dose. What may interact with this medicine? Interactions are not expected. Do not use any other skin products without telling your doctor or health care professional. This list may not describe all possible interactions. Give your health care provider a list of all the medicines, herbs, non-prescription drugs, or dietary supplements you use. Also tell them if you smoke, drink alcohol,  or use illegal drugs. Some items may interact with your medicine. What should I watch for while using this medicine? Visit your doctor or health care professional for checks on your progress. You will need to use this medicine for 2 to 6 weeks. This may be longer depending on the condition being treated. You may not see full healing for another 1 to 2 months after you stop using the medicine. Treated areas of skin can look unsightly during and for several weeks after treatment with this medicine. This medicine can make you more sensitive to the sun. Keep out of the sun. If you cannot avoid being in the sun, wear protective clothing and use sunscreen. Do not use sun lamps or tanning beds/booths. If a pet comes in contact with the area where this medicine was applied to your skin or if it is ingested, they may have a serious risk of side effects. If accidental contact happens, the skin of the pet should be washed right away with soap and water. Contact your vet right away if your pet becomes exposed. Do not become pregnant while taking this medicine. Women should inform their doctor if they wish to become pregnant or think they might be pregnant. There is a potential for serious side effects to an unborn child. Talk to your health care professional or pharmacist for more information. What side effects may I notice from receiving this medicine? Side effects that you should report to your doctor or health care professional as soon as possible: -allergic reactions like skin rash, itching or hives, swelling of the face, lips, or tongue -black or bloody stools, blood in the urine or vomit -blurred vision -chest pain -difficulty breathing or wheezing -redness, blistering, peeling or loosening of the skin, including inside the mouth -severe redness and swelling of normal skin -slurred speech or weakness on one side of the body -trouble passing urine or change in the amount of urine -unexplained weight gain  or swelling -unusually weak or tired -yellowing of eyes or skin Side effects that usually do not require medical attention (report to your doctor or health care professional if they continue or are bothersome): -increased sensitivity of the skin to sun and ultraviolet light -pain and burning of the affected area -scaling or swelling of the affected area -skin rash, itching of the affected area -tenderness This list may not describe all possible side effects. Call your doctor for medical advice about side effects. You may report side effects to FDA at 1-800-FDA-1088. Where should I keep my medicine? Keep out of the reach of children and pets. Store at  room temperature between 20 and 25 degrees C (68 and 77 degrees F). Throw away any unused medicine after the expiration date. NOTE: This sheet is a summary. It may not cover all possible information. If you have questions about this medicine, talk to your doctor, pharmacist, or health care provider.  2018 Elsevier/Gold Standard (2015-03-21 17:35:08)

## 2018-01-03 NOTE — Telephone Encounter (Signed)
Per 11/11 los ref. To SW

## 2018-01-04 ENCOUNTER — Inpatient Hospital Stay (HOSPITAL_BASED_OUTPATIENT_CLINIC_OR_DEPARTMENT_OTHER): Payer: BLUE CROSS/BLUE SHIELD | Admitting: Obstetrics

## 2018-01-04 ENCOUNTER — Encounter: Payer: Self-pay | Admitting: Obstetrics

## 2018-01-04 VITALS — BP 142/90 | HR 64 | Temp 98.6°F | Resp 18 | Ht 62.0 in | Wt 214.7 lb

## 2018-01-04 DIAGNOSIS — Z5111 Encounter for antineoplastic chemotherapy: Secondary | ICD-10-CM | POA: Diagnosis not present

## 2018-01-04 DIAGNOSIS — Z90722 Acquired absence of ovaries, bilateral: Secondary | ICD-10-CM | POA: Diagnosis not present

## 2018-01-04 DIAGNOSIS — C561 Malignant neoplasm of right ovary: Secondary | ICD-10-CM

## 2018-01-04 DIAGNOSIS — Z90411 Acquired partial absence of pancreas: Secondary | ICD-10-CM | POA: Diagnosis not present

## 2018-01-04 DIAGNOSIS — D638 Anemia in other chronic diseases classified elsewhere: Secondary | ICD-10-CM | POA: Diagnosis not present

## 2018-01-04 DIAGNOSIS — D509 Iron deficiency anemia, unspecified: Secondary | ICD-10-CM | POA: Diagnosis not present

## 2018-01-04 DIAGNOSIS — C7961 Secondary malignant neoplasm of right ovary: Secondary | ICD-10-CM | POA: Diagnosis not present

## 2018-01-04 DIAGNOSIS — E669 Obesity, unspecified: Secondary | ICD-10-CM | POA: Diagnosis not present

## 2018-01-04 DIAGNOSIS — Z90710 Acquired absence of both cervix and uterus: Secondary | ICD-10-CM | POA: Diagnosis not present

## 2018-01-04 DIAGNOSIS — E860 Dehydration: Secondary | ICD-10-CM | POA: Diagnosis not present

## 2018-01-04 DIAGNOSIS — C541 Malignant neoplasm of endometrium: Secondary | ICD-10-CM | POA: Diagnosis not present

## 2018-01-04 DIAGNOSIS — E876 Hypokalemia: Secondary | ICD-10-CM | POA: Diagnosis not present

## 2018-01-04 DIAGNOSIS — I700 Atherosclerosis of aorta: Secondary | ICD-10-CM | POA: Diagnosis not present

## 2018-01-04 DIAGNOSIS — R52 Pain, unspecified: Secondary | ICD-10-CM | POA: Diagnosis not present

## 2018-01-04 DIAGNOSIS — Z79899 Other long term (current) drug therapy: Secondary | ICD-10-CM | POA: Diagnosis not present

## 2018-01-04 DIAGNOSIS — M069 Rheumatoid arthritis, unspecified: Secondary | ICD-10-CM | POA: Diagnosis not present

## 2018-01-04 NOTE — Patient Instructions (Signed)
1. Return to clinic 3 months for pelvic

## 2018-01-05 ENCOUNTER — Inpatient Hospital Stay: Payer: BLUE CROSS/BLUE SHIELD

## 2018-01-05 ENCOUNTER — Inpatient Hospital Stay (HOSPITAL_BASED_OUTPATIENT_CLINIC_OR_DEPARTMENT_OTHER): Payer: BLUE CROSS/BLUE SHIELD | Admitting: Medical

## 2018-01-05 ENCOUNTER — Other Ambulatory Visit: Payer: Self-pay | Admitting: Emergency Medicine

## 2018-01-05 ENCOUNTER — Telehealth: Payer: Self-pay | Admitting: General Practice

## 2018-01-05 VITALS — BP 142/96 | HR 100 | Temp 98.4°F | Resp 18

## 2018-01-05 DIAGNOSIS — E669 Obesity, unspecified: Secondary | ICD-10-CM | POA: Diagnosis not present

## 2018-01-05 DIAGNOSIS — R197 Diarrhea, unspecified: Secondary | ICD-10-CM

## 2018-01-05 DIAGNOSIS — R112 Nausea with vomiting, unspecified: Secondary | ICD-10-CM | POA: Diagnosis not present

## 2018-01-05 DIAGNOSIS — C541 Malignant neoplasm of endometrium: Secondary | ICD-10-CM

## 2018-01-05 DIAGNOSIS — M069 Rheumatoid arthritis, unspecified: Secondary | ICD-10-CM

## 2018-01-05 DIAGNOSIS — C7961 Secondary malignant neoplasm of right ovary: Secondary | ICD-10-CM | POA: Diagnosis not present

## 2018-01-05 DIAGNOSIS — C252 Malignant neoplasm of tail of pancreas: Secondary | ICD-10-CM

## 2018-01-05 DIAGNOSIS — Z79899 Other long term (current) drug therapy: Secondary | ICD-10-CM | POA: Diagnosis not present

## 2018-01-05 DIAGNOSIS — D509 Iron deficiency anemia, unspecified: Secondary | ICD-10-CM | POA: Diagnosis not present

## 2018-01-05 DIAGNOSIS — Z5111 Encounter for antineoplastic chemotherapy: Secondary | ICD-10-CM | POA: Diagnosis not present

## 2018-01-05 DIAGNOSIS — Z90710 Acquired absence of both cervix and uterus: Secondary | ICD-10-CM | POA: Diagnosis not present

## 2018-01-05 DIAGNOSIS — Z90411 Acquired partial absence of pancreas: Secondary | ICD-10-CM | POA: Diagnosis not present

## 2018-01-05 DIAGNOSIS — D638 Anemia in other chronic diseases classified elsewhere: Secondary | ICD-10-CM | POA: Diagnosis not present

## 2018-01-05 DIAGNOSIS — I700 Atherosclerosis of aorta: Secondary | ICD-10-CM | POA: Diagnosis not present

## 2018-01-05 DIAGNOSIS — R52 Pain, unspecified: Secondary | ICD-10-CM | POA: Diagnosis not present

## 2018-01-05 DIAGNOSIS — Z90722 Acquired absence of ovaries, bilateral: Secondary | ICD-10-CM | POA: Diagnosis not present

## 2018-01-05 DIAGNOSIS — E876 Hypokalemia: Secondary | ICD-10-CM | POA: Diagnosis not present

## 2018-01-05 DIAGNOSIS — E860 Dehydration: Secondary | ICD-10-CM | POA: Diagnosis not present

## 2018-01-05 LAB — CBC WITH DIFFERENTIAL (CANCER CENTER ONLY)
Abs Immature Granulocytes: 0.01 10*3/uL (ref 0.00–0.07)
Basophils Absolute: 0 10*3/uL (ref 0.0–0.1)
Basophils Relative: 0 %
Eosinophils Absolute: 0 10*3/uL (ref 0.0–0.5)
Eosinophils Relative: 0 %
HCT: 39.9 % (ref 36.0–46.0)
Hemoglobin: 12 g/dL (ref 12.0–15.0)
Immature Granulocytes: 0 %
Lymphocytes Relative: 37 %
Lymphs Abs: 1.6 10*3/uL (ref 0.7–4.0)
MCH: 27.1 pg (ref 26.0–34.0)
MCHC: 30.1 g/dL (ref 30.0–36.0)
MCV: 90.1 fL (ref 80.0–100.0)
Monocytes Absolute: 0.2 10*3/uL (ref 0.1–1.0)
Monocytes Relative: 4 %
Neutro Abs: 2.6 10*3/uL (ref 1.7–7.7)
Neutrophils Relative %: 59 %
Platelet Count: 521 10*3/uL — ABNORMAL HIGH (ref 150–400)
RBC: 4.43 MIL/uL (ref 3.87–5.11)
RDW: 18.3 % — ABNORMAL HIGH (ref 11.5–15.5)
WBC Count: 4.4 10*3/uL (ref 4.0–10.5)
nRBC: 0 % (ref 0.0–0.2)

## 2018-01-05 LAB — CMP (CANCER CENTER ONLY)
ALT: 88 U/L — ABNORMAL HIGH (ref 0–44)
AST: 50 U/L — ABNORMAL HIGH (ref 15–41)
Albumin: 3.9 g/dL (ref 3.5–5.0)
Alkaline Phosphatase: 63 U/L (ref 38–126)
Anion gap: 14 (ref 5–15)
BUN: 12 mg/dL (ref 6–20)
CO2: 28 mmol/L (ref 22–32)
Calcium: 10.1 mg/dL (ref 8.9–10.3)
Chloride: 96 mmol/L — ABNORMAL LOW (ref 98–111)
Creatinine: 0.75 mg/dL (ref 0.44–1.00)
GFR, Est AFR Am: >60 mL/min (ref >60)
GFR, Estimated: >60 mL/min (ref >60)
Glucose, Bld: 127 mg/dL — ABNORMAL HIGH (ref 70–99)
Potassium: 3.8 mmol/L (ref 3.5–5.1)
Sodium: 138 mmol/L (ref 135–145)
Total Bilirubin: 0.6 mg/dL (ref 0.3–1.2)
Total Protein: 7.5 g/dL (ref 6.5–8.1)

## 2018-01-05 MED ORDER — LORAZEPAM 1 MG PO TABS
ORAL_TABLET | ORAL | Status: AC
  Filled 2018-01-05: qty 1

## 2018-01-05 MED ORDER — HEPARIN SOD (PORK) LOCK FLUSH 100 UNIT/ML IV SOLN
500.0000 [IU] | Freq: Once | INTRAVENOUS | Status: DC | PRN
  Filled 2018-01-05: qty 5

## 2018-01-05 MED ORDER — HEPARIN SOD (PORK) LOCK FLUSH 100 UNIT/ML IV SOLN
500.0000 [IU] | Freq: Once | INTRAVENOUS | Status: AC
  Administered 2018-01-05: 500 [IU]
  Filled 2018-01-05: qty 5

## 2018-01-05 MED ORDER — SODIUM CHLORIDE 0.9 % IV SOLN
Freq: Once | INTRAVENOUS | Status: AC
  Administered 2018-01-05: 16:00:00 via INTRAVENOUS
  Filled 2018-01-05: qty 250

## 2018-01-05 MED ORDER — LORAZEPAM 1 MG PO TABS
0.5000 mg | ORAL_TABLET | Freq: Once | ORAL | Status: AC
  Administered 2018-01-05: 0.5 mg via ORAL

## 2018-01-05 MED ORDER — SODIUM CHLORIDE 0.9% FLUSH
10.0000 mL | Freq: Once | INTRAVENOUS | Status: AC
  Administered 2018-01-05: 10 mL
  Filled 2018-01-05: qty 10

## 2018-01-05 MED ORDER — PEGFILGRASTIM INJECTION 6 MG/0.6ML ~~LOC~~
PREFILLED_SYRINGE | SUBCUTANEOUS | Status: AC
  Filled 2018-01-05: qty 0.6

## 2018-01-05 MED ORDER — DIPHENOXYLATE-ATROPINE 2.5-0.025 MG PO TABS: ORAL_TABLET | ORAL | 1 refills | Status: DC

## 2018-01-05 MED ORDER — SODIUM CHLORIDE 0.9% FLUSH
10.0000 mL | INTRAVENOUS | Status: DC | PRN
  Administered 2018-01-05: 10 mL
  Filled 2018-01-05: qty 10

## 2018-01-05 MED ORDER — PEGFILGRASTIM INJECTION 6 MG/0.6ML ~~LOC~~
6.0000 mg | PREFILLED_SYRINGE | Freq: Once | SUBCUTANEOUS | Status: AC
  Administered 2018-01-05: 6 mg via SUBCUTANEOUS

## 2018-01-05 MED ORDER — LORAZEPAM 1 MG PO TABS
0.5000 mg | ORAL_TABLET | Freq: Once | ORAL | Status: AC
  Administered 2018-01-05: 0.5 mg via SUBLINGUAL

## 2018-01-05 MED ORDER — LORAZEPAM 0.5 MG PO TABS: 0.5000 mg | ORAL_TABLET | Freq: Four times a day (QID) | ORAL | 1 refills | Status: DC | PRN

## 2018-01-05 NOTE — Telephone Encounter (Signed)
[facility] CSW Progress Note  Request received from Dr Burr Medico to help patient initiate disability application - called patient who was awaiting medical care.  Pt states she will call CSW when she is more able to talk/feels better.    Edwyna Shell, LCSW Clinical Social Worker Phone:  [phone]

## 2018-01-05 NOTE — Patient Instructions (Signed)

## 2018-01-05 NOTE — Progress Notes (Signed)
Pt came in for pump d'c appt with NVD, headache and some dizziness. Stated, she's only eaten soup in 2 days and can barely drink fluids. Only a limited amount of liquids consumed. Has taken 3 imodium today to help stop the diarrhea. 142/96 bp 100p 18rr 98.4t 100 o2ra. Pain level 0 when asked. Pump was discontinued @1511  with neulasta injection. Pt is still accessed and has been flushed with NS only. Sym management paper taken to PA to be seen. Kristle Wesch LPN

## 2018-01-05 NOTE — Progress Notes (Signed)
Pt reports improvement in nausea after admin ativan and gen fatigue.  Pt to possibly receive IVF tomorrow (11/15) if she feels she needs it.  Verbalized understanding to call with questions or concerns or if she feels worse/no better.

## 2018-01-06 ENCOUNTER — Inpatient Hospital Stay: Payer: BLUE CROSS/BLUE SHIELD | Admitting: Medical

## 2018-01-06 ENCOUNTER — Telehealth (INDEPENDENT_AMBULATORY_CARE_PROVIDER_SITE_OTHER): Payer: Self-pay

## 2018-01-06 VITALS — BP 160/111 | HR 123 | Temp 99.0°F | Resp 14 | Ht 62.0 in | Wt 207.9 lb

## 2018-01-06 DIAGNOSIS — Z90710 Acquired absence of both cervix and uterus: Secondary | ICD-10-CM | POA: Diagnosis not present

## 2018-01-06 DIAGNOSIS — E860 Dehydration: Secondary | ICD-10-CM | POA: Diagnosis not present

## 2018-01-06 DIAGNOSIS — Z5111 Encounter for antineoplastic chemotherapy: Secondary | ICD-10-CM | POA: Diagnosis not present

## 2018-01-06 DIAGNOSIS — E876 Hypokalemia: Secondary | ICD-10-CM | POA: Diagnosis not present

## 2018-01-06 DIAGNOSIS — C541 Malignant neoplasm of endometrium: Secondary | ICD-10-CM | POA: Diagnosis not present

## 2018-01-06 DIAGNOSIS — D509 Iron deficiency anemia, unspecified: Secondary | ICD-10-CM | POA: Diagnosis not present

## 2018-01-06 DIAGNOSIS — Z79899 Other long term (current) drug therapy: Secondary | ICD-10-CM | POA: Diagnosis not present

## 2018-01-06 DIAGNOSIS — Z90411 Acquired partial absence of pancreas: Secondary | ICD-10-CM | POA: Diagnosis not present

## 2018-01-06 DIAGNOSIS — C7961 Secondary malignant neoplasm of right ovary: Secondary | ICD-10-CM

## 2018-01-06 DIAGNOSIS — M069 Rheumatoid arthritis, unspecified: Secondary | ICD-10-CM | POA: Diagnosis not present

## 2018-01-06 DIAGNOSIS — R52 Pain, unspecified: Secondary | ICD-10-CM | POA: Diagnosis not present

## 2018-01-06 DIAGNOSIS — D638 Anemia in other chronic diseases classified elsewhere: Secondary | ICD-10-CM | POA: Diagnosis not present

## 2018-01-06 DIAGNOSIS — Z90722 Acquired absence of ovaries, bilateral: Secondary | ICD-10-CM | POA: Diagnosis not present

## 2018-01-06 DIAGNOSIS — E669 Obesity, unspecified: Secondary | ICD-10-CM | POA: Diagnosis not present

## 2018-01-06 DIAGNOSIS — I700 Atherosclerosis of aorta: Secondary | ICD-10-CM | POA: Diagnosis not present

## 2018-01-06 DIAGNOSIS — R112 Nausea with vomiting, unspecified: Secondary | ICD-10-CM

## 2018-01-06 MED ORDER — SODIUM CHLORIDE 0.9 % IV SOLN
Freq: Once | INTRAVENOUS | Status: AC
  Administered 2018-01-06: 13:00:00 via INTRAVENOUS
  Filled 2018-01-06: qty 250

## 2018-01-06 MED ORDER — LORAZEPAM 2 MG/ML IJ SOLN
INTRAMUSCULAR | Status: AC
  Filled 2018-01-06: qty 1

## 2018-01-06 MED ORDER — SODIUM CHLORIDE 0.9 % IV SOLN
INTRAVENOUS | Status: AC
  Administered 2018-01-06: 12:00:00 via INTRAVENOUS
  Filled 2018-01-06 (×2): qty 250

## 2018-01-06 MED ORDER — SODIUM CHLORIDE 0.9% FLUSH
10.0000 mL | Freq: Once | INTRAVENOUS | Status: AC
  Administered 2018-01-06: 10 mL
  Filled 2018-01-06: qty 10

## 2018-01-06 MED ORDER — PROMETHAZINE HCL 25 MG/ML IJ SOLN
INTRAMUSCULAR | Status: AC
  Filled 2018-01-06: qty 1

## 2018-01-06 MED ORDER — HEPARIN SOD (PORK) LOCK FLUSH 100 UNIT/ML IV SOLN
500.0000 [IU] | Freq: Once | INTRAVENOUS | Status: AC
  Administered 2018-01-06: 500 [IU]
  Filled 2018-01-06: qty 5

## 2018-01-06 MED ORDER — MECLIZINE HCL 25 MG PO TABS: 25.0000 mg | ORAL_TABLET | Freq: Three times a day (TID) | ORAL | 1 refills | Status: DC | PRN

## 2018-01-06 MED ORDER — PROMETHAZINE HCL 25 MG/ML IJ SOLN
12.5000 mg | Freq: Four times a day (QID) | INTRAMUSCULAR | Status: AC | PRN
  Administered 2018-01-06: 12.5 mg via INTRAVENOUS

## 2018-01-06 MED ORDER — DEXAMETHASONE SODIUM PHOSPHATE 10 MG/ML IJ SOLN
INTRAMUSCULAR | Status: AC
  Filled 2018-01-06: qty 1

## 2018-01-06 MED ORDER — DEXAMETHASONE SODIUM PHOSPHATE 10 MG/ML IJ SOLN
10.0000 mg | Freq: Once | INTRAMUSCULAR | Status: AC
  Administered 2018-01-06: 10 mg via INTRAVENOUS

## 2018-01-06 MED ORDER — LORAZEPAM 2 MG/ML IJ SOLN
0.5000 mg | Freq: Once | INTRAMUSCULAR | Status: AC
  Administered 2018-01-06: 0.5 mg via INTRAVENOUS

## 2018-01-06 MED ORDER — SODIUM CHLORIDE 0.9 % IV SOLN: 10.0000 mg | Freq: Once | INTRAVENOUS | Status: DC

## 2018-01-06 NOTE — Patient Instructions (Signed)

## 2018-01-06 NOTE — Progress Notes (Signed)
After receiving IVF and anti-nausea medication pt was able to drink chicken broth and reports dec in nausea and dizziness.  States she feels well enough to go home but understands she can call with any questions or concerns.

## 2018-01-06 NOTE — Telephone Encounter (Signed)
Submitted VOB for Synvisc series, bilateral knee. 

## 2018-01-09 ENCOUNTER — Telehealth (INDEPENDENT_AMBULATORY_CARE_PROVIDER_SITE_OTHER): Payer: Self-pay

## 2018-01-09 NOTE — Telephone Encounter (Signed)
Received VOB for Synvisc series, bilateral knee stating that product is not covered through her insurance, which is [employer] [place].  [employer] will not cover any gel injection.  Please advise Dr. Kathi Ludwig.  Thank you.

## 2018-01-10 ENCOUNTER — Encounter: Payer: Self-pay | Admitting: Hematology

## 2018-01-10 NOTE — Telephone Encounter (Signed)
Visco denied by insurance, patient wants to know what the next step should be?

## 2018-01-10 NOTE — Progress Notes (Signed)
Ms. Dillen was again seen today for IV fluids and antiemetics.  She was given Decadron 10 mg IV, Phenergan 12.5 mg IV, and Ativan 0.5 mg IV x1.  She was able to drink some chicken broth and other liquids.  Her nausea improved prior to being release to home.  Sandi Mealy, MHS, PA-C Physician Assistant

## 2018-01-10 NOTE — Telephone Encounter (Signed)
Spoke with patient and discussed that her Visco supplements are not covered by insurance.  She is disappointed about the current situation.  She had very good response to Visco supplements in the past.  I have advised her to keep [place] updated about her knee situation.  She is currently having rough time due to chemotherapy.

## 2018-01-10 NOTE — Progress Notes (Signed)
[facility] Progress Note   Dana Hicks [id number] 1976/02/05 42 y.o.  Dana Hicks is managed by Dr. Heath Lark  Actively treated with chemotherapy/immunotherapy: yes  Current Therapy: FOLFIRINOX  Last Treated: 01/03/2018 (cycle 1)  Assessment: Plan:    Non-intractable vomiting with nausea, unspecified vomiting type - Plan: 0.9 %  sodium chloride infusion, LORazepam (ATIVAN) tablet 0.5 mg, LORazepam (ATIVAN) tablet 0.5 mg, LORazepam (ATIVAN) 0.5 MG tablet  Diarrhea, unspecified type - Plan: diphenoxylate-atropine (LOMOTIL) 2.5-0.025 MG tablet  Endometrial cancer ([place]) - Plan: heparin lock flush 100 unit/mL, sodium chloride flush (NS) 0.9 % injection 10 mL, DISCONTINUED: heparin lock flush 100 unit/mL, DISCONTINUED: sodium chloride flush (NS) 0.9 % injection 10 mL  Secondary malignant neoplasm of right ovary ([place]) - Plan: heparin lock flush 100 unit/mL, sodium chloride flush (NS) 0.9 % injection 10 mL, DISCONTINUED: heparin lock flush 100 unit/mL, DISCONTINUED: sodium chloride flush (NS) 0.9 % injection 10 mL   Nausea and vomiting: The patient was given 1 L of normal saline IV today, Ativan 0.5 mg sublingual x2 and a prescription for Ativan 0.5 mg sublingual every 6 hours as needed for nausea.  Diarrhea: The patient was dosed with Lomotil to use 4 times daily as needed for diarrhea.  She was also given 1 L of normal saline IV today.  She will return tomorrow for additional IV hydration if needed.  Endometrial cancer: The patient was last treated with cycle 1 of FOLFIRINOX on 01/03/2018.  She will be seen on 01/16/2018 by Dr. Truitt Merle for consideration of her next cycle of chemotherapy.  Please see After Visit Summary for patient specific instructions.  Future Appointments  Date Time Provider [place]  01/16/2018  9:30 AM [facility] ONLY [facility] None  01/16/2018  9:45 AM [facility] [place] FLUSH [facility] None  01/16/2018 10:15 AM Truitt Merle, MD  [facility] None  01/16/2018 11:00 AM [facility] INFUSION [facility] None  01/18/2018  3:30 PM [facility] [place] FLUSH [facility] None  01/30/2018  9:30 AM [facility] LAB 4 [facility] None  01/30/2018  9:45 AM [facility] [place] FLUSH [facility] None  01/30/2018 10:15 AM Truitt Merle, MD [facility] None  01/30/2018 11:00 AM [facility] INFUSION [facility] None  02/01/2018  3:30 PM [facility] [place] FLUSH [facility] None  04/10/2018  9:45 AM Isabel Caprice, MD [facility] None  05/15/2018  8:40 AM Ofilia Neas, PA-C [facility] None    No orders of the defined types were placed in this encounter.      Subjective:   Patient ID:  Dana Hicks is a 42 y.o. (DOB October 29, 1975) female.  Chief Complaint: No chief complaint on file.   HPI Dana Hicks is a 42 year old female with a history of an endometrial cancer who is status post cycle 1 of FOLFIRINOX which was dosed on 01/03/2018.  She presents to the clinic today with her father.  She reports that she has had diarrhea despite taking 3 doses of Imodium today.  She is also having nausea and vomiting.  She has been taking Compazine and Zofran at home without benefit.  She reports anorexia, fatigue, weakness, dizziness, and chest pain with movement and activity.  She denies fevers, chills, or sweats.  Medications: I have reviewed the patient's current medications.  Allergies:  Allergies  Allergen Reactions  . Codeine Other (See Comments)    headaches  . Penicillins     Severe headaches Has patient had a PCN reaction causing immediate rash, facial/tongue/throat swelling, SOB or lightheadedness with hypotension:  No Has patient had a PCN reaction causing severe rash involving mucus membranes or skin necrosis: No Has patient had a PCN reaction that required hospitalization: No Has patient had a PCN reaction occurring within the last 10 years: No If all of the above answers are "NO", then may proceed with Cephalosporin use.   . Bactrim  [Sulfamethoxazole-Trimethoprim] Rash    Past Medical History:  Diagnosis Date  . Allergic rhinitis, seasonal   . 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 Endometrial cancer ([place]) 08/2017   Stage IA, Grade 1  . History of MRSA infection 01/2017   followed by infectious disease center--  recurrent pustular folliculitis  . Nephrolithiasis    bilateral nonobstructive calculi per CT 08-18-2017  . OA (osteoarthritis)   . Ovarian cancer, right ([place]) 08/2017   Stage II Grade 2 Endometrioid  . Pancreatic cancer ([place])   . Rheumatoid arthritis [facility])    rheumatologist-  dr devenswar-- treated w/ oral prednisone daily and methotrexate injection every 3 wks    Past Surgical History:  Procedure Laterality Date  . BREAST LUMPECTOMY WITH RADIOACTIVE SEED LOCALIZATION Left 06/28/2014   Benign Procedure: RADIOACTIVE SEED LOCALIZATION LEFT BREAST LUMPECTOMY;  Surgeon: Excell Seltzer, MD;  Location: [facility];  Service: General;  Laterality: Left;  . COLONOSCOPY  06/19/14  . EYE SURGERY Left    plug in tear duct  . IR IMAGING GUIDED PORT INSERTION  09/26/2017  . KNEE ARTHROSCOPY    . PANCREATECTOMY  11/25/2017  . PORT A CATH REVISION  2001   in and out  . RIGHT COLECTOMY  06/02/2009   cecum cancer  . ROBOTIC ASSISTED TOTAL HYSTERECTOMY WITH BILATERAL SALPINGO OOPHERECTOMY Bilateral 08/30/2017   Procedure: XI ROBOTIC ASSISTED TOTAL  HYSTERECTOMY BILATERAL  SALPINGO OOPHORECTOMY; LYSIS OF ADHESIONS;  Surgeon: Isabel Caprice, MD;  Location: [facility];  Service: Gynecology;  Laterality: Bilateral;  . SPLENECTOMY, PARTIAL  11/25/2017    Family History  Problem Relation Age of Onset  . Arthritis/Rheumatoid Mother   . Uterine cancer Mother 62  . Allergic rhinitis Father   . Heart disease Father   . Drug abuse Son   . Other Maternal Aunt        Unknown GYN CA  . Colon cancer Maternal Uncle 30  . Diabetes Maternal  Grandmother   . Cancer Maternal Grandfather        d. 5s of liver/lung cancer  . Angioedema Neg Hx   . Asthma Neg Hx   . Atopy Neg Hx   . Eczema Neg Hx   . Immunodeficiency Neg Hx   . Urticaria Neg Hx     Social History   Socioeconomic History  . Marital status: Married    Spouse name: Quita Skye  . Number of children: 2  . Years of education: Not on file  . Highest education level: Not on file  Occupational History  . Occupation: Futures trader  Social Needs  . Financial resource strain: Not on file  . Food insecurity:    Worry: Not on file    Inability: Not on file  . Transportation needs:    Medical: Not on file    Non-medical: Not on file  Tobacco Use  . Smoking status: Former Smoker    Packs/day: 1.00    Years: 5.00    Pack years: 5.00    Types: Cigarettes    Last attempt to quit: 02/05/1998  Years since quitting: 19.9  . Smokeless tobacco: Never Used  Substance and Sexual Activity  . Alcohol use: Not Currently  . Drug use: No  . Sexual activity: Not Currently  Lifestyle  . Physical activity:    Days per week: Not on file    Minutes per session: Not on file  . Stress: Not on file  Relationships  . Social connections:    Talks on phone: Not on file    Gets together: Not on file    Attends religious service: Not on file    Active member of club or organization: Not on file    Attends meetings of clubs or organizations: Not on file    Relationship status: Not on file  . Intimate partner violence:    Fear of current or ex partner: Not on file    Emotionally abused: Not on file    Physically abused: Not on file    Forced sexual activity: Not on file  Other Topics Concern  . Not on file  Social History Narrative  . Not on file    Past Medical History, Surgical history, Social history, and Family history were reviewed and updated as appropriate.   Please see review of systems for further details on the patient's review from today.   Review of  Systems:  Review of Systems  Constitutional: Positive for appetite change and fatigue. Negative for chills, diaphoresis and fever.  HENT: Negative for sore throat and trouble swallowing.   Respiratory: Negative for cough, choking, chest tightness, shortness of breath and wheezing.   Cardiovascular: Positive for chest pain. Negative for palpitations and leg swelling.  Gastrointestinal: Positive for diarrhea, nausea and vomiting. Negative for abdominal distention, abdominal pain, blood in stool and constipation.  Genitourinary: Negative for decreased urine volume and difficulty urinating.  Neurological: Positive for dizziness and weakness. Negative for headaches.    Objective:   Physical Exam:  LMP 08/30/2017  ECOG: 0  Physical Exam  Constitutional: No distress.  HENT:  Head: Normocephalic and atraumatic.  Mouth/Throat: Oropharynx is clear and moist.  Cardiovascular: Normal rate, regular rhythm and normal heart sounds. Exam reveals no gallop and no friction rub.  No murmur heard. Pulmonary/Chest: Effort normal and breath sounds normal. No stridor. No respiratory distress. She has no wheezes. She has no rales.  Abdominal: Soft. Bowel sounds are normal. She exhibits no distension and no mass. There is no tenderness. There is no rebound and no guarding.  Neurological: She is alert. Coordination normal.  Skin: Skin is warm and dry. She is not diaphoretic.    Lab Review:     Component Value Date/Time   NA 138 01/05/2018 1542   K 3.8 01/05/2018 1542   CL 96 (L) 01/05/2018 1542   CO2 28 01/05/2018 1542   GLUCOSE 127 (H) 01/05/2018 1542   BUN 12 01/05/2018 1542   CREATININE 0.75 01/05/2018 1542   CREATININE 0.68 07/27/2017 0851   CALCIUM 10.1 01/05/2018 1542   PROT 7.5 01/05/2018 1542   ALBUMIN 3.9 01/05/2018 1542   AST 50 (H) 01/05/2018 1542   ALT 88 (H) 01/05/2018 1542   ALKPHOS 63 01/05/2018 1542   BILITOT 0.6 01/05/2018 1542   GFRNONAA >60 01/05/2018 1542   GFRNONAA 106  07/27/2017 0851   GFRAA >60 01/05/2018 1542   GFRAA 123 07/27/2017 0851       Component Value Date/Time   WBC 4.4 01/05/2018 1542   WBC 14.1 (H) 01/02/2018 1025   RBC 4.43 01/05/2018 1542  HGB 12.0 01/05/2018 1542   HGB 13.0 05/15/2009 1616   HCT 39.9 01/05/2018 1542   HCT 38.0 05/15/2009 1616   PLT 521 (H) 01/05/2018 1542   PLT 215 05/15/2009 1616   MCV 90.1 01/05/2018 1542   MCV 92.7 05/15/2009 1616   MCH 27.1 01/05/2018 1542   MCHC 30.1 01/05/2018 1542   RDW 18.3 (H) 01/05/2018 1542   RDW 13.6 05/15/2009 1616   LYMPHSABS 1.6 01/05/2018 1542   LYMPHSABS 2.2 05/15/2009 1616   MONOABS 0.2 01/05/2018 1542   MONOABS 0.5 05/15/2009 1616   EOSABS 0.0 01/05/2018 1542   EOSABS 0.1 05/15/2009 1616   BASOSABS 0.0 01/05/2018 1542   BASOSABS 0.0 05/15/2009 1616   -------------------------------  Imaging from last 24 hours (if applicable):  Radiology interpretation: Ct Abdomen Pelvis W Contrast  Result Date: 12/28/2017 CLINICAL DATA:  History of colon cancer, ovarian cancer, uterine cancer, and pancreatic cancer. Lynch syndrome. Prior partial pancreatectomy and splenectomy. Restaging assessment. EXAM: CT ABDOMEN AND PELVIS WITH CONTRAST TECHNIQUE: Multidetector CT imaging of the abdomen and pelvis was performed using the standard protocol following bolus administration of intravenous contrast. CONTRAST:  164mL OMNIPAQUE IOHEXOL 300 MG/ML  SOLN COMPARISON:  11/07/2017 FINDINGS: Lower chest: Asymmetric fullness of left inferior breast tissues, image 9/2. Mild scarring or atelectasis in the right middle lobe and anteriorly in the right lower lobe. Hepatobiliary: Stable hemangioma in segment IVa of the liver. Cholecystectomy. Pancreas: Partial pancreatectomy. A cystic lesion measuring approximately 4.7 by 2.8 by 3.1 cm (volume = 21 cm^3)tracks along and below the pancreatic resection margin. Spleen: Splenectomy Adrenals/Urinary Tract: Nodularity along or within the lateral limb of the left  adrenal gland measuring 1.0 by 1.4 cm on image 43/7. 3 mm left kidney lower pole nonobstructive renal calculus, image 52/3. 2 mm right kidney lower pole nonobstructive renal calculus, image 51/3. Urinary bladder unremarkable. Stomach/Bowel: Right hemicolectomy. Vascular/Lymphatic: Aortoiliac atherosclerotic vascular disease. Reproductive: Uterus absent. Adnexa unremarkable. Ovaries not well seen. Other: Increased stranding in the omentum likely from recent surgery. Laparotomy site noted. Small focus of suspected subcutaneous fat necrosis along the right side of the laparotomy site, this resembles herniated adipose tissue but I do not see a definite hernia neck. Musculoskeletal: Unremarkable IMPRESSION: 1. Postoperative findings from recent partial pancreatectomy including a 21 cubic cm fluid collection along the pancreatic resection margin which could represent early pseudocyst. 2. Nodularity along the lateral limb of the left adrenal gland could also be postoperative but merit surveillance, as the pancreatic lesion was in close proximity to this adrenal gland on the prior CT of 11/07/2017. 3. Asymmetric fullness inferiorly in the left breast. The patient has a history of prior left breast procedures, correlation with mammography is recommended. 4. Other imaging findings of potential clinical significance: Stable hemangioma in the left hepatic lobe. Splenectomy. Aortic Atherosclerosis (ICD10-I70.0). Right hemicolectomy. Small focus of fat necrosis in the right anterior abdominal wall subcutaneous tissues near the laparotomy site. Bilateral nonobstructive nephrolithiasis. Electronically Signed   By: Van Clines M.D.   On: 12/28/2017 09:38

## 2018-01-11 ENCOUNTER — Inpatient Hospital Stay: Payer: BLUE CROSS/BLUE SHIELD

## 2018-01-11 ENCOUNTER — Other Ambulatory Visit: Payer: Self-pay

## 2018-01-11 ENCOUNTER — Inpatient Hospital Stay (HOSPITAL_BASED_OUTPATIENT_CLINIC_OR_DEPARTMENT_OTHER): Payer: BLUE CROSS/BLUE SHIELD | Admitting: Medical

## 2018-01-11 ENCOUNTER — Telehealth: Payer: Self-pay

## 2018-01-11 ENCOUNTER — Encounter: Payer: Self-pay | Admitting: Medical

## 2018-01-11 VITALS — BP 121/80 | HR 97 | Resp 18

## 2018-01-11 DIAGNOSIS — C541 Malignant neoplasm of endometrium: Secondary | ICD-10-CM | POA: Diagnosis not present

## 2018-01-11 DIAGNOSIS — R197 Diarrhea, unspecified: Secondary | ICD-10-CM

## 2018-01-11 DIAGNOSIS — R52 Pain, unspecified: Secondary | ICD-10-CM | POA: Diagnosis not present

## 2018-01-11 DIAGNOSIS — M069 Rheumatoid arthritis, unspecified: Secondary | ICD-10-CM | POA: Diagnosis not present

## 2018-01-11 DIAGNOSIS — Z90411 Acquired partial absence of pancreas: Secondary | ICD-10-CM | POA: Diagnosis not present

## 2018-01-11 DIAGNOSIS — E876 Hypokalemia: Secondary | ICD-10-CM | POA: Diagnosis not present

## 2018-01-11 DIAGNOSIS — Z90722 Acquired absence of ovaries, bilateral: Secondary | ICD-10-CM | POA: Diagnosis not present

## 2018-01-11 DIAGNOSIS — D638 Anemia in other chronic diseases classified elsewhere: Secondary | ICD-10-CM | POA: Diagnosis not present

## 2018-01-11 DIAGNOSIS — D509 Iron deficiency anemia, unspecified: Secondary | ICD-10-CM | POA: Diagnosis not present

## 2018-01-11 DIAGNOSIS — Z5111 Encounter for antineoplastic chemotherapy: Secondary | ICD-10-CM | POA: Diagnosis not present

## 2018-01-11 DIAGNOSIS — C252 Malignant neoplasm of tail of pancreas: Secondary | ICD-10-CM

## 2018-01-11 DIAGNOSIS — R112 Nausea with vomiting, unspecified: Secondary | ICD-10-CM

## 2018-01-11 DIAGNOSIS — Z79899 Other long term (current) drug therapy: Secondary | ICD-10-CM | POA: Diagnosis not present

## 2018-01-11 DIAGNOSIS — C7961 Secondary malignant neoplasm of right ovary: Secondary | ICD-10-CM

## 2018-01-11 DIAGNOSIS — Z90710 Acquired absence of both cervix and uterus: Secondary | ICD-10-CM | POA: Diagnosis not present

## 2018-01-11 DIAGNOSIS — E860 Dehydration: Secondary | ICD-10-CM | POA: Diagnosis not present

## 2018-01-11 DIAGNOSIS — Z87891 Personal history of nicotine dependence: Secondary | ICD-10-CM

## 2018-01-11 DIAGNOSIS — E669 Obesity, unspecified: Secondary | ICD-10-CM | POA: Diagnosis not present

## 2018-01-11 DIAGNOSIS — I700 Atherosclerosis of aorta: Secondary | ICD-10-CM | POA: Diagnosis not present

## 2018-01-11 LAB — CBC WITH DIFFERENTIAL (CANCER CENTER ONLY)
Abs Immature Granulocytes: 0.15 10*3/uL — ABNORMAL HIGH (ref 0.00–0.07)
Basophils Absolute: 0 10*3/uL (ref 0.0–0.1)
Basophils Relative: 0 %
Eosinophils Absolute: 0.1 10*3/uL (ref 0.0–0.5)
Eosinophils Relative: 2 %
HCT: 39.7 % (ref 36.0–46.0)
Hemoglobin: 11.8 g/dL — ABNORMAL LOW (ref 12.0–15.0)
Immature Granulocytes: 4 %
Lymphocytes Relative: 44 %
Lymphs Abs: 1.9 10*3/uL (ref 0.7–4.0)
MCH: 26.9 pg (ref 26.0–34.0)
MCHC: 29.7 g/dL — ABNORMAL LOW (ref 30.0–36.0)
MCV: 90.6 fL (ref 80.0–100.0)
Monocytes Absolute: 0.8 10*3/uL (ref 0.1–1.0)
Monocytes Relative: 19 %
Neutro Abs: 1.3 10*3/uL — ABNORMAL LOW (ref 1.7–7.7)
Neutrophils Relative %: 31 %
Platelet Count: 331 10*3/uL (ref 150–400)
RBC: 4.38 MIL/uL (ref 3.87–5.11)
RDW: 17.4 % — ABNORMAL HIGH (ref 11.5–15.5)
WBC Count: 4.2 10*3/uL (ref 4.0–10.5)
nRBC: 1.4 % — ABNORMAL HIGH (ref 0.0–0.2)

## 2018-01-11 LAB — CMP (CANCER CENTER ONLY)
ALT: 82 U/L — ABNORMAL HIGH (ref 0–44)
AST: 30 U/L (ref 15–41)
Albumin: 3.1 g/dL — ABNORMAL LOW (ref 3.5–5.0)
Alkaline Phosphatase: 90 U/L (ref 38–126)
Anion gap: 11 (ref 5–15)
BUN: 9 mg/dL (ref 6–20)
CO2: 25 mmol/L (ref 22–32)
Calcium: 8.9 mg/dL (ref 8.9–10.3)
Chloride: 103 mmol/L (ref 98–111)
Creatinine: 0.77 mg/dL (ref 0.44–1.00)
GFR, Est AFR Am: >60 mL/min (ref >60)
GFR, Estimated: >60 mL/min (ref >60)
Glucose, Bld: 116 mg/dL — ABNORMAL HIGH (ref 70–99)
Potassium: 3.4 mmol/L — ABNORMAL LOW (ref 3.5–5.1)
Sodium: 139 mmol/L (ref 135–145)
Total Bilirubin: 0.3 mg/dL (ref 0.3–1.2)
Total Protein: 6.6 g/dL (ref 6.5–8.1)

## 2018-01-11 LAB — MAGNESIUM: Magnesium: 1.5 mg/dL — ABNORMAL LOW (ref 1.7–2.4)

## 2018-01-11 MED ORDER — ATROPINE SULFATE 1 MG/ML IJ SOLN
0.4000 mg | Freq: Once | INTRAMUSCULAR | Status: AC
  Administered 2018-01-11: 0.4 mg via INTRAVENOUS

## 2018-01-11 MED ORDER — SODIUM CHLORIDE 0.9 % IV SOLN: 4.0000 g | Freq: Once | INTRAVENOUS | Status: DC

## 2018-01-11 MED ORDER — DEXAMETHASONE SODIUM PHOSPHATE 10 MG/ML IJ SOLN
INTRAMUSCULAR | Status: AC
  Filled 2018-01-11: qty 1

## 2018-01-11 MED ORDER — DEXAMETHASONE SODIUM PHOSPHATE 10 MG/ML IJ SOLN
10.0000 mg | Freq: Once | INTRAMUSCULAR | Status: AC
  Administered 2018-01-11: 10 mg via INTRAVENOUS

## 2018-01-11 MED ORDER — SODIUM CHLORIDE 0.9 % IV SOLN
Freq: Once | INTRAVENOUS | Status: AC
  Administered 2018-01-11: 12:00:00 via INTRAVENOUS
  Filled 2018-01-11: qty 250

## 2018-01-11 MED ORDER — SODIUM CHLORIDE 0.9% FLUSH
10.0000 mL | Freq: Once | INTRAVENOUS | Status: AC
  Administered 2018-01-11: 10 mL
  Filled 2018-01-11: qty 10

## 2018-01-11 MED ORDER — SODIUM CHLORIDE 0.9 % IV SOLN: 10.0000 mg | Freq: Once | INTRAVENOUS | Status: DC

## 2018-01-11 MED ORDER — ATROPINE SULFATE 1 MG/ML IJ SOLN
INTRAMUSCULAR | Status: AC
  Filled 2018-01-11: qty 1

## 2018-01-11 MED ORDER — LORAZEPAM 1 MG PO TABS
ORAL_TABLET | ORAL | Status: AC
  Filled 2018-01-11: qty 1

## 2018-01-11 MED ORDER — HEPARIN SOD (PORK) LOCK FLUSH 100 UNIT/ML IV SOLN
500.0000 [IU] | Freq: Once | INTRAVENOUS | Status: AC
  Administered 2018-01-11: 500 [IU]
  Filled 2018-01-11: qty 5

## 2018-01-11 MED ORDER — LORAZEPAM 1 MG PO TABS
0.5000 mg | ORAL_TABLET | Freq: Once | ORAL | Status: AC
  Administered 2018-01-11: 0.5 mg via SUBLINGUAL

## 2018-01-11 NOTE — Telephone Encounter (Signed)
Called patient to have her come into [facility] at 11:15 for labs and see Sandi Mealy PA and possible IVF, patient verbalized an understanding.

## 2018-01-11 NOTE — Patient Instructions (Signed)
Dehydration, Adult Dehydration is a condition in which there is not enough fluid or water in the body. This happens when you lose more fluids than you take in. Important organs, such as the kidneys, brain, and heart, cannot function without a proper amount of fluids. Any loss of fluids from the body can lead to dehydration. Dehydration can range from mild to severe. This condition should be treated right away to prevent it from becoming severe. What are the causes? This condition may be caused by:  Vomiting.  Diarrhea.  Excessive sweating, such as from heat exposure or exercise.  Not drinking enough fluid, especially: ? When ill. ? While doing activity that requires a lot of energy.  Excessive urination.  Fever.  Infection.  Certain medicines, such as medicines that cause the body to lose excess fluid (diuretics).  Inability to access safe drinking water.  Reduced physical ability to get adequate water and food.  What increases the risk? This condition is more likely to develop in people:  Who have a poorly controlled long-term (chronic) illness, such as diabetes, heart disease, or kidney disease.  Who are age 65 or older.  Who are disabled.  Who live in a place with high altitude.  Who play endurance sports.  What are the signs or symptoms? Symptoms of mild dehydration may include:  Thirst.  Dry lips.  Slightly dry mouth.  Dry, warm skin.  Dizziness. Symptoms of moderate dehydration may include:  Very dry mouth.  Muscle cramps.  Dark urine. Urine may be the color of tea.  Decreased urine production.  Decreased tear production.  Heartbeat that is irregular or faster than normal (palpitations).  Headache.  Light-headedness, especially when you stand up from a sitting position.  Fainting (syncope). Symptoms of severe dehydration may include:  Changes in skin, such as: ? Cold and clammy skin. ? Blotchy (mottled) or pale skin. ? Skin that does  not quickly return to normal after being lightly pinched and released (poor skin turgor).  Changes in body fluids, such as: ? Extreme thirst. ? No tear production. ? Inability to sweat when body temperature is high, such as in hot weather. ? Very little urine production.  Changes in vital signs, such as: ? Weak pulse. ? Pulse that is more than 100 beats a minute when sitting still. ? Rapid breathing. ? Low blood pressure.  Other changes, such as: ? Sunken eyes. ? Cold hands and feet. ? Confusion. ? Lack of energy (lethargy). ? Difficulty waking up from sleep. ? Short-term weight loss. ? Unconsciousness. How is this diagnosed? This condition is diagnosed based on your symptoms and a physical exam. Blood and urine tests may be done to help confirm the diagnosis. How is this treated? Treatment for this condition depends on the severity. Mild or moderate dehydration can often be treated at home. Treatment should be started right away. Do not wait until dehydration becomes severe. Severe dehydration is an emergency and it needs to be treated in a hospital. Treatment for mild dehydration may include:  Drinking more fluids.  Replacing salts and minerals in your blood (electrolytes) that you may have lost. Treatment for moderate dehydration may include:  Drinking an oral rehydration solution (ORS). This is a drink that helps you replace fluids and electrolytes (rehydrate). It can be found at pharmacies and retail stores. Treatment for severe dehydration may include:  Receiving fluids through an IV tube.  Receiving an electrolyte solution through a feeding tube that is passed through your nose   and into your stomach (nasogastric tube, or NG tube).  Correcting any abnormalities in electrolytes.  Treating the underlying cause of dehydration. Follow these instructions at home:  If directed by your health care provider, drink an ORS: ? Make an ORS by following instructions on the  package. ? Start by drinking small amounts, about  cup (120 mL) every 5-10 minutes. ? Slowly increase how much you drink until you have taken the amount recommended by your health care provider.  Drink enough clear fluid to keep your urine clear or pale yellow. If you were told to drink an ORS, finish the ORS first, then start slowly drinking other clear fluids. Drink fluids such as: ? Water. Do not drink only water. Doing that can lead to having too little salt (sodium) in the body (hyponatremia). ? Ice chips. ? Fruit juice that you have added water to (diluted fruit juice). ? Low-calorie sports drinks.  Avoid: ? Alcohol. ? Drinks that contain a lot of sugar. These include high-calorie sports drinks, fruit juice that is not diluted, and soda. ? Caffeine. ? Foods that are greasy or contain a lot of fat or sugar.  Take over-the-counter and prescription medicines only as told by your health care provider.  Do not take sodium tablets. This can lead to having too much sodium in the body (hypernatremia).  Eat foods that contain a healthy balance of electrolytes, such as bananas, oranges, potatoes, tomatoes, and spinach.  Keep all follow-up visits as told by your health care provider. This is important. Contact a health care provider if:  You have abdominal pain that: ? Gets worse. ? Stays in one area (localizes).  You have a rash.  You have a stiff neck.  You are more irritable than usual.  You are sleepier or more difficult to wake up than usual.  You feel weak or dizzy.  You feel very thirsty.  You have urinated only a small amount of very dark urine over 6-8 hours. Get help right away if:  You have symptoms of severe dehydration.  You cannot drink fluids without vomiting.  Your symptoms get worse with treatment.  You have a fever.  You have a severe headache.  You have vomiting or diarrhea that: ? Gets worse. ? Does not go away.  You have blood or green matter  (bile) in your vomit.  You have blood in your stool. This may cause stool to look black and tarry.  You have not urinated in 6-8 hours.  You faint.  Your heart rate while sitting still is over 100 beats a minute.  You have trouble breathing. This information is not intended to replace advice given to you by your health care provider. Make sure you discuss any questions you have with your health care provider. Document Released: 02/08/2005 Document Revised: 09/05/2015 Document Reviewed: 04/04/2015 Elsevier Interactive Patient Education  2018 [employer]  

## 2018-01-11 NOTE — Telephone Encounter (Signed)
Patient calls stating that she received treatment 9 days ago, still having a lot of stomach cramps and diarrhea, has come in two times for IV fluids.  Needs advice on what to do.  Her [phone]

## 2018-01-12 ENCOUNTER — Inpatient Hospital Stay: Payer: BLUE CROSS/BLUE SHIELD

## 2018-01-12 ENCOUNTER — Other Ambulatory Visit: Payer: Self-pay | Admitting: Medical

## 2018-01-12 ENCOUNTER — Other Ambulatory Visit: Payer: Self-pay | Admitting: Hematology

## 2018-01-12 VITALS — BP 137/86 | HR 86 | Temp 99.2°F | Resp 18

## 2018-01-12 DIAGNOSIS — I700 Atherosclerosis of aorta: Secondary | ICD-10-CM | POA: Diagnosis not present

## 2018-01-12 DIAGNOSIS — E860 Dehydration: Secondary | ICD-10-CM | POA: Diagnosis not present

## 2018-01-12 DIAGNOSIS — R52 Pain, unspecified: Secondary | ICD-10-CM | POA: Diagnosis not present

## 2018-01-12 DIAGNOSIS — R197 Diarrhea, unspecified: Secondary | ICD-10-CM

## 2018-01-12 DIAGNOSIS — Z90710 Acquired absence of both cervix and uterus: Secondary | ICD-10-CM | POA: Diagnosis not present

## 2018-01-12 DIAGNOSIS — Z90722 Acquired absence of ovaries, bilateral: Secondary | ICD-10-CM | POA: Diagnosis not present

## 2018-01-12 DIAGNOSIS — M069 Rheumatoid arthritis, unspecified: Secondary | ICD-10-CM | POA: Diagnosis not present

## 2018-01-12 DIAGNOSIS — C7961 Secondary malignant neoplasm of right ovary: Secondary | ICD-10-CM | POA: Diagnosis not present

## 2018-01-12 DIAGNOSIS — Z95828 Presence of other vascular implants and grafts: Secondary | ICD-10-CM

## 2018-01-12 DIAGNOSIS — E876 Hypokalemia: Secondary | ICD-10-CM | POA: Diagnosis not present

## 2018-01-12 DIAGNOSIS — D509 Iron deficiency anemia, unspecified: Secondary | ICD-10-CM | POA: Diagnosis not present

## 2018-01-12 DIAGNOSIS — C541 Malignant neoplasm of endometrium: Secondary | ICD-10-CM | POA: Diagnosis not present

## 2018-01-12 DIAGNOSIS — E669 Obesity, unspecified: Secondary | ICD-10-CM | POA: Diagnosis not present

## 2018-01-12 DIAGNOSIS — Z90411 Acquired partial absence of pancreas: Secondary | ICD-10-CM | POA: Diagnosis not present

## 2018-01-12 DIAGNOSIS — D638 Anemia in other chronic diseases classified elsewhere: Secondary | ICD-10-CM | POA: Diagnosis not present

## 2018-01-12 DIAGNOSIS — Z79899 Other long term (current) drug therapy: Secondary | ICD-10-CM | POA: Diagnosis not present

## 2018-01-12 DIAGNOSIS — Z5111 Encounter for antineoplastic chemotherapy: Secondary | ICD-10-CM | POA: Diagnosis not present

## 2018-01-12 LAB — C DIFFICILE QUICK SCREEN W PCR REFLEX
C Diff antigen: NEGATIVE
C Diff interpretation: NOT DETECTED
C Diff toxin: NEGATIVE

## 2018-01-12 MED ORDER — SODIUM CHLORIDE 0.9 % IV SOLN
4.0000 g | Freq: Once | INTRAVENOUS | Status: DC
  Filled 2018-01-12: qty 8

## 2018-01-12 MED ORDER — SODIUM CHLORIDE 0.9% FLUSH
10.0000 mL | Freq: Once | INTRAVENOUS | Status: AC
  Administered 2018-01-12: 10 mL via INTRAVENOUS
  Filled 2018-01-12: qty 10

## 2018-01-12 MED ORDER — HEPARIN SOD (PORK) LOCK FLUSH 100 UNIT/ML IV SOLN
500.0000 [IU] | Freq: Once | INTRAVENOUS | Status: AC
  Administered 2018-01-12: 500 [IU] via INTRAVENOUS
  Filled 2018-01-12: qty 5

## 2018-01-12 MED ORDER — SODIUM CHLORIDE 0.9 % IV SOLN
Freq: Once | INTRAVENOUS | Status: AC
  Administered 2018-01-12: 11:00:00 via INTRAVENOUS
  Filled 2018-01-12: qty 1000

## 2018-01-12 MED ORDER — DICYCLOMINE HCL 10 MG PO CAPS: 10.0000 mg | ORAL_CAPSULE | Freq: Three times a day (TID) | ORAL | 0 refills | Status: DC | PRN

## 2018-01-12 NOTE — Addendum Note (Signed)
Addended by: Tora Kindred on: 01/12/2018 01:30 PM   Modules accepted: Orders

## 2018-01-12 NOTE — Patient Instructions (Signed)
Hypomagnesemia  Hypomagnesemia is a condition in which the level of magnesium in the blood is low. Magnesium is a mineral that is found in many foods. It is used in many different processes in the body. Hypomagnesemia can affect every organ in the body. It can cause life-threatening problems.  What are the causes?  Causes of hypomagnesemia include:   Not getting enough magnesium in your diet.   Malnutrition.   Problems with absorbing magnesium from the intestines.   Dehydration.   Alcohol abuse.   Vomiting.   Severe diarrhea.   Some medicines, including medicines that make you urinate more.   Certain diseases, such as kidney disease, diabetes, and overactive thyroid.    What are the signs or symptoms?   Involuntary shaking or trembling of a body part (tremor).   Confusion.   Muscle weakness.   Sensitivity to light, sound, and touch.   Psychiatric issues, such as depression, irritability, or psychosis.   Sudden tightening of muscles (muscle spasms).   Tingling in the arms and legs.   A feeling of fluttering of the heart.  These symptoms are more severe if magnesium levels drop suddenly.  How is this diagnosed?  To make a diagnosis, your health care provider will do a physical exam and order blood and urine tests.  How is this treated?  Treatment will depend on the cause and the severity of your condition. It may involve:   A magnesium supplement. This can be taken in pill form. It can also be given through an IV tube. This is usually done if the condition is severe.   Changes to your diet. You may be directed to eat foods that have a lot of magnesium, such as green leafy vegetables, peas, beans, and nuts.   Eliminating alcohol from your diet.    Follow these instructions at home:   Include foods with magnesium in your diet. Foods that are rich in magnesium include green vegetables, beans, nuts and seeds, and whole grains.   Take medicines only as directed by your health care provider.   Take  magnesium supplements if your health care provider instructs you to do that. Take them as directed.   Have your magnesium levels monitored as directed by your health care provider.   When you are active, drink fluids that contain electrolytes.   Keep all follow-up visits as directed by your health care provider. This is important.  Contact a health care provider if:   You get worse instead of better.   Your symptoms return.  Get help right away if:   Your symptoms are severe.  This information is not intended to replace advice given to you by your health care provider. Make sure you discuss any questions you have with your health care provider.  Document Released: 11/04/2004 Document Revised: 07/17/2015 Document Reviewed: 09/24/2013  Elsevier Interactive Patient Education  2018 [employer]

## 2018-01-13 ENCOUNTER — Telehealth: Payer: Self-pay

## 2018-01-13 ENCOUNTER — Ambulatory Visit: Payer: BLUE CROSS/BLUE SHIELD | Admitting: Obstetrics

## 2018-01-13 ENCOUNTER — Telehealth: Payer: Self-pay | Admitting: General Practice

## 2018-01-13 NOTE — Telephone Encounter (Signed)
Patient calls stating that she was seen by [facility] yesterday received fluids, magnesium and potassium.  She is reporting she actually feels worse after this.  She is very nauseated and the thought of food makes her want to throw up.    Her [phone]

## 2018-01-13 NOTE — Progress Notes (Addendum)
Symptoms Management Clinic Progress Note   Dana Hicks [id number] 09/09/1975 42 y.o.  Dana Hicks is managed by Dr. Heath Hicks  Actively treated with chemotherapy/immunotherapy: yes  Current Therapy: FOLFIRINOX  Last Treated: 01/03/2018 (cycle 1)  Assessment: Plan:    Non-intractable vomiting with nausea, unspecified vomiting type - Plan: LORazepam (ATIVAN) tablet 0.5 mg, dexamethasone (DECADRON) injection 10 mg, DISCONTINUED: dexamethasone (DECADRON) 10 mg in sodium chloride 0.9 % 50 mL IVPB  Diarrhea, unspecified type - Plan: atropine injection 0.4 mg, dexamethasone (DECADRON) injection 10 mg, Culture, Stool, C difficile quick screen w PCR reflex  Dehydration - Plan: 0.9 %  sodium chloride infusion, dexamethasone (DECADRON) injection 10 mg  Hypomagnesemia - Plan: DISCONTINUED: magnesium sulfate 4 g in sodium chloride 0.9 % 1,000 mL, DISCONTINUED: magnesium sulfate 4 g in sodium chloride 0.9 % 1,000 mL   Nausea and vomiting: The patient was given 2 L of normal saline IV today, Ativan 0.5 mg sublingual x1 and dexamethasone 10 mg IV x 1.  Diarrhea: The patient was dosed with atropine 0.4 mg IV x 1.  She was encouraged to continue Lomotil 1-2 tabs po QID prn.  Hypomagnesemia and hypokalemia: The patient will return tomorrow for IV fluids with the addition of 4 mg of magnesium and 20 mEq of potassium.  Endometrial cancer: The patient was last treated with cycle 1 of FOLFIRINOX on 01/03/2018.  She will be seen on 01/16/2018 by Dr. Truitt Hicks for consideration of her next cycle of chemotherapy.  Please see After Visit Summary for patient specific instructions.  Future Appointments  Date Time Provider [place]  01/16/2018  9:30 AM [facility] LAB 1 [facility] None  01/16/2018  9:45 AM [facility] [place] FLUSH [facility] None  01/16/2018 10:15 AM Dana Merle, MD [facility] None  01/16/2018 11:00 AM [facility] INFUSION [facility] None  01/18/2018  3:30 PM [facility]  [place] FLUSH [facility] None  01/30/2018  9:30 AM [facility] LAB 4 [facility] None  01/30/2018  9:45 AM [facility] [place] FLUSH [facility] None  01/30/2018 10:15 AM Dana Merle, MD [facility] None  01/30/2018 11:00 AM [facility] INFUSION [facility] None  02/01/2018  3:30 PM [facility] [place] None  04/10/2018  9:45 AM Dana Caprice, MD [facility] None  05/15/2018  8:40 AM Dana Neas, PA-C [facility] None    Orders Placed This Encounter  Procedures  . Culture, Stool  . C difficile quick screen w PCR reflex       Subjective:   Patient ID:  Dana Hicks is a 42 y.o. (DOB [birth year]) female.  Chief Complaint:  Chief Complaint  Patient presents with  . Fatigue  . Nausea  . Diarrhea    HPI Dana Hicks  is a 42 year old female with a history of an endometrial cancer who is status post cycle 1 of FOLFIRINOX which was dosed on 01/03/2018.  She again presents to the clinic today with her father.  She continues to have diarrhea, nausea, vomiting, and anorexia despite her use of Lomotil, Zofran, Compazine, and Ativan. Today is her 3rd visit for IVF over the last week.  She denies fevers, chills, or sweats.  Medications: I have reviewed the patient's current medications.  Allergies:  Allergies  Allergen Reactions  . Codeine Other (See Comments)    headaches  . Penicillins     Severe headaches Has patient had a PCN reaction causing immediate rash, facial/tongue/throat swelling, SOB or lightheadedness with hypotension: No Has patient had a PCN reaction causing severe rash involving mucus  membranes or skin necrosis: No Has patient had a PCN reaction that required hospitalization: No Has patient had a PCN reaction occurring within the last 10 years: No If all of the above answers are "NO", then may proceed with Cephalosporin use.   . Bactrim [Sulfamethoxazole-Trimethoprim] Rash    Past Medical History:  Diagnosis Date  . Allergic rhinitis, seasonal   . 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 Endometrial cancer ([place]) 08/2017   Stage IA, Grade 1  . History of MRSA infection 01/2017   followed by infectious disease center--  recurrent pustular folliculitis  . Nephrolithiasis    bilateral nonobstructive calculi per CT 08-18-2017  . OA (osteoarthritis)   . Ovarian cancer, right ([place]) 08/2017   Stage II Grade 2 Endometrioid  . Pancreatic cancer ([place])   . Rheumatoid arthritis [facility])    rheumatologist-  dr devenswar-- treated w/ oral prednisone daily and methotrexate injection every 3 wks    Past Surgical History:  Procedure Laterality Date  . BREAST LUMPECTOMY WITH RADIOACTIVE SEED LOCALIZATION Left 06/28/2014   Benign Procedure: RADIOACTIVE SEED LOCALIZATION LEFT BREAST LUMPECTOMY;  Surgeon: Excell Seltzer, MD;  Location: [facility];  Service: General;  Laterality: Left;  . COLONOSCOPY  06/19/14  . EYE SURGERY Left    plug in tear duct  . IR IMAGING GUIDED PORT INSERTION  09/26/2017  . KNEE ARTHROSCOPY    . PANCREATECTOMY  11/25/2017  . PORT A CATH REVISION  2001   in and out  . RIGHT COLECTOMY  06/02/2009   cecum cancer  . ROBOTIC ASSISTED TOTAL HYSTERECTOMY WITH BILATERAL SALPINGO OOPHERECTOMY Bilateral 08/30/2017   Procedure: XI ROBOTIC ASSISTED TOTAL  HYSTERECTOMY BILATERAL  SALPINGO OOPHORECTOMY; LYSIS OF ADHESIONS;  Surgeon: Dana Caprice, MD;  Location: [facility];  Service: Gynecology;  Laterality: Bilateral;  . SPLENECTOMY, PARTIAL  11/25/2017    Family History  Problem Relation Age of Onset  . Arthritis/Rheumatoid Mother   . Uterine cancer Mother 52  . Allergic rhinitis Father   . Heart disease Father   . Drug abuse Son   . Other Maternal Aunt        Unknown GYN CA  . Colon cancer Maternal Uncle 39  . Diabetes Maternal Grandmother   . Cancer Maternal Grandfather        d. 20s of liver/lung cancer  . Angioedema Neg Hx   . Asthma Neg Hx   . Atopy Neg Hx    . Eczema Neg Hx   . Immunodeficiency Neg Hx   . Urticaria Neg Hx     Social History   Socioeconomic History  . Marital status: Married    Spouse name: Dana Hicks  . Number of children: 2  . Years of education: Not on file  . Highest education level: Not on file  Occupational History  . Occupation: Futures trader  Social Needs  . Financial resource strain: Not on file  . Food insecurity:    Worry: Not on file    Inability: Not on file  . Transportation needs:    Medical: Not on file    Non-medical: Not on file  Tobacco Use  . Smoking status: Former Smoker    Packs/day: 1.00    Years: 5.00    Pack years: 5.00    Types: Cigarettes    Last attempt to quit: 02/05/1998    Years since quitting: 19.9  . Smokeless tobacco: Never Used  Substance and Sexual Activity  . Alcohol use: Not Currently  . Drug use: No  . Sexual activity: Not Currently  Lifestyle  . Physical activity:    Days per week: Not on file    Minutes per session: Not on file  . Stress: Not on file  Relationships  . Social connections:    Talks on phone: Not on file    Gets together: Not on file    Attends religious service: Not on file    Active member of club or organization: Not on file    Attends meetings of clubs or organizations: Not on file    Relationship status: Not on file  . Intimate partner violence:    Fear of current or ex partner: Not on file    Emotionally abused: Not on file    Physically abused: Not on file    Forced sexual activity: Not on file  Other Topics Concern  . Not on file  Social History Narrative  . Not on file    Past Medical History, Surgical history, Social history, and Family history were reviewed and updated as appropriate.   Please see review of systems for further details on the patient's review from today.   Review of Systems:  Review of Systems  Constitutional: Positive for appetite change and fatigue. Negative for chills, diaphoresis and fever.    Respiratory: Negative for cough, choking, shortness of breath and wheezing.   Cardiovascular: Negative for chest pain and palpitations.  Gastrointestinal: Positive for diarrhea, nausea and vomiting. Negative for constipation.  Genitourinary: Negative for decreased urine volume.  Neurological: Negative for headaches.    Objective:   Physical Exam:  LMP 08/30/2017  ECOG: 1  Physical Exam  Constitutional: No distress.  HENT:  Head: Normocephalic and atraumatic.  Cardiovascular: Normal rate, regular rhythm and normal heart sounds. Exam reveals no gallop and no friction rub.  No murmur heard. Pulmonary/Chest: Effort normal and breath sounds normal. No stridor. No respiratory distress. She has no wheezes. She has no rales.  Abdominal: Soft. Bowel sounds are normal. She exhibits no distension and no mass. There is no tenderness. There is no rebound and no guarding.  Neurological: She is alert. Coordination normal.  Skin: Skin is warm and dry. She is not diaphoretic.    Lab Review:     Component Value Date/Time   NA 139 01/11/2018 1115   K 3.4 (L) 01/11/2018 1115   CL 103 01/11/2018 1115   CO2 25 01/11/2018 1115   GLUCOSE 116 (H) 01/11/2018 1115   BUN 9 01/11/2018 1115   CREATININE 0.77 01/11/2018 1115   CREATININE 0.68 07/27/2017 0851   CALCIUM 8.9 01/11/2018 1115   PROT 6.6 01/11/2018 1115   ALBUMIN 3.1 (L) 01/11/2018 1115   AST 30 01/11/2018 1115   ALT 82 (H) 01/11/2018 1115   ALKPHOS 90 01/11/2018 1115   BILITOT 0.3 01/11/2018 1115   GFRNONAA >60 01/11/2018 1115   GFRNONAA 106 07/27/2017 0851   GFRAA >60 01/11/2018 1115   GFRAA 123 07/27/2017 0851       Component Value Date/Time   WBC 4.2 01/11/2018 1115   WBC 14.1 (H) 01/02/2018 1025   RBC 4.38 01/11/2018 1115   HGB 11.8 (L) 01/11/2018 1115   HGB 13.0 05/15/2009 1616   HCT 39.7 01/11/2018 1115   HCT 38.0 05/15/2009 1616   PLT 331 01/11/2018 1115   PLT 215 05/15/2009 1616   MCV 90.6 01/11/2018 1115   MCV  92.7 05/15/2009 1616  MCH 26.9 01/11/2018 1115   MCHC 29.7 (L) 01/11/2018 1115   RDW 17.4 (H) 01/11/2018 1115   RDW 13.6 05/15/2009 1616   LYMPHSABS 1.9 01/11/2018 1115   LYMPHSABS 2.2 05/15/2009 1616   MONOABS 0.8 01/11/2018 1115   MONOABS 0.5 05/15/2009 1616   EOSABS 0.1 01/11/2018 1115   EOSABS 0.1 05/15/2009 1616   BASOSABS 0.0 01/11/2018 1115   BASOSABS 0.0 05/15/2009 1616   -------------------------------  Imaging from last 24 hours (if applicable):  Radiology interpretation: Ct Abdomen Pelvis W Contrast  Result Date: 12/28/2017 CLINICAL DATA:  History of colon cancer, ovarian cancer, uterine cancer, and pancreatic cancer. Lynch syndrome. Prior partial pancreatectomy and splenectomy. Restaging assessment. EXAM: CT ABDOMEN AND PELVIS WITH CONTRAST TECHNIQUE: Multidetector CT imaging of the abdomen and pelvis was performed using the standard protocol following bolus administration of intravenous contrast. CONTRAST:  158mL OMNIPAQUE IOHEXOL 300 MG/ML  SOLN COMPARISON:  11/07/2017 FINDINGS: Lower chest: Asymmetric fullness of left inferior breast tissues, image 9/2. Mild scarring or atelectasis in the right middle lobe and anteriorly in the right lower lobe. Hepatobiliary: Stable hemangioma in segment IVa of the liver. Cholecystectomy. Pancreas: Partial pancreatectomy. A cystic lesion measuring approximately 4.7 by 2.8 by 3.1 cm (volume = 21 cm^3)tracks along and below the pancreatic resection margin. Spleen: Splenectomy Adrenals/Urinary Tract: Nodularity along or within the lateral limb of the left adrenal gland measuring 1.0 by 1.4 cm on image 43/7. 3 mm left kidney lower pole nonobstructive renal calculus, image 52/3. 2 mm right kidney lower pole nonobstructive renal calculus, image 51/3. Urinary bladder unremarkable. Stomach/Bowel: Right hemicolectomy. Vascular/Lymphatic: Aortoiliac atherosclerotic vascular disease. Reproductive: Uterus absent. Adnexa unremarkable. Ovaries not well seen.  Other: Increased stranding in the omentum likely from recent surgery. Laparotomy site noted. Small focus of suspected subcutaneous fat necrosis along the right side of the laparotomy site, this resembles herniated adipose tissue but I do not see a definite hernia neck. Musculoskeletal: Unremarkable IMPRESSION: 1. Postoperative findings from recent partial pancreatectomy including a 21 cubic cm fluid collection along the pancreatic resection margin which could represent early pseudocyst. 2. Nodularity along the lateral limb of the left adrenal gland could also be postoperative but merit surveillance, as the pancreatic lesion was in close proximity to this adrenal gland on the prior CT of 11/07/2017. 3. Asymmetric fullness inferiorly in the left breast. The patient has a history of prior left breast procedures, correlation with mammography is recommended. 4. Other imaging findings of potential clinical significance: Stable hemangioma in the left hepatic lobe. Splenectomy. Aortic Atherosclerosis (ICD10-I70.0). Right hemicolectomy. Small focus of fat necrosis in the right anterior abdominal wall subcutaneous tissues near the laparotomy site. Bilateral nonobstructive nephrolithiasis. Electronically Signed   By:  Clines M.D.   On: 12/28/2017 09:38        This patient was seen with Dr. Burr Medico with my treatment plan reviewed with her. She expressed agreement with my medical management of this patient.  .Addendum  I have seen the patient, examined her. I agree with the assessment and and plan and have edited the notes.   Ms. Edmundson has had severe nausea, diarrhea, low appetite and dehydration since her first cycle of Lovenox.  This is her third visit to our symptom management clinic.  She is getting IV fluids, labs reviewed.  We discussed the management of diarrhea and antiemetics again.  Due to her poor tolerance, I plan to reduce her urine taken from 150mg  to 130 mg/m, and at IV Emend to premedication  from next  cycle.  We also reviewed the steroids dexamethasone schedule, she will take 5 tablets the day before chemo, and changed to 1 tablet daily for 5 days after chemo, to help with the nausea.  She knows to drink fluids adequately, I will reevaluate her next Monday before cycle 2.  If she does not recover well by then, we will postpone her next cycle chemotherapy.  Questions were answered.  Dana Hicks  01/11/2018

## 2018-01-13 NOTE — Telephone Encounter (Signed)
Spoke with patient per Dr. Burr Medico, Instructed her to take Deltasone 5 mg one daily for 3 days, use Zofran, Compazine and Ativan as prescribed.  We will postpone next Monday's treatment and will give IVF in place of and reschedule her treatment for the following week.  Patient verbalized an understanding.

## 2018-01-13 NOTE — Telephone Encounter (Signed)
[place] CSW Progress Notes  Called patient to discuss MD request to speak w her about applying for disability.  Discussed various options for applying including applying via [facility], private lawyer and/or directly w [place].  Will mail information brochure on application process.  Patient has not been feeling well enough to initiate this process, but will review information and contact CSW when ready to proceed if help is needed.  Edwyna Shell, LCSW Clinical Social Worker Phone:  [phone]

## 2018-01-14 NOTE — Progress Notes (Signed)
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 Mixed attenuation mass emanates from the right adnexa measuring 10.8 x 8.0 cm very suspicious for right ovarian carcinoma. 2. Abnormality of the tail of the pancreas may be due to mild pancreatitis and small pseudocyst formation, but neoplasm cannot be excluded. 3. Small amount of ascites within abdomen and pelvis. 4. Small nonobstructing renal calculi bilaterally.     08/30/2017 Pathology Results    1. Uterus and cervix, with left fallopian tube - ENDOMETRIOID ADENOCARCINOMA, FIGO GRADE I, ARISING IN A BACKGROUND OF DIFFUSE COMPLEX ATYPICAL HYPERPLASIA. - CARCINOMA INVADES FOR OF DEPTH OF 0.2 CM WHERE THICKNESS OF MYOMETRIAL WALL IS 2.1 CM. - ALL RESECTION MARGINS ARE NEGATIVE FOR CARCINOMA. - NEGATIVE FOR LYMPHOVASCULAR OR PERINEURAL INVASION. - CERVICAL STROMA IS NOT INVOLVED. - SEE ONCOLOGY TABLE. - SEE NOTE 2. Ovary and fallopian tube, right - PRIMARY OVARIAN ENDOMETRIOID ADENOCARCINOMA, FIGO GRADE II, 12 CM. - THE OVARIAN SURFACE IS FOCALLY INVOLVED BY CARCINOMA. - NEGATIVE FOR LYMPHOVASCULAR INVASION. - BENIGN UNREMARKABLE FALLOPIAN TUBE, NEGATIVE FOR CARCINOMA. - SEE ONCOLOGY TABLE. - SEE NOTE 3. Cul-de-sac biopsy - METASTATIC ADENOCARCINOMA, MOST CONSISTENT WITH PRIMARY OVARIAN ENDOMETRIOID ADENOCARCINOMA. 4. Ovary, left - BENIGN UNREMARKABLE OVARY, NEGATIVE FOR MALIGNANCY. Microscopic Comment 1. UTERUS, CARCINOMA OR CARCINOSARCOMA Procedure: Total hysterectomy with bilateral  salpingo-oophorectomy. Histologic type: Endometrioid adenocarcinoma. Histologic Grade: FIGO Grade I Myometrial invasion: Depth of invasion: 2 mm Myometrial thickness: 21 mm Uterine Serosa Involvement: Not identified Cervical stromal involvement: Not identified Extent of involvement of other organs: Not applicable Lymphovascular invasion: Not identified Regional Lymph Nodes: Examined: 0 Sentinel 0 Non-sentinel 0 Total Tumor block for ancillary studies: 1H MMR / MSI testing: Pending Pathologic Stage Classification (pTNM, AJCC 8th edition): pT1a, pNX (v4.1.0.0) 2. OVARY or FALLOPIAN TUBE or PRIMARY PERITONEUM: Procedure: Salpingo-oophorectomy Specimen Integrity: Intact Tumor Site: Right ovary Ovarian Surface Involvement (required only if applicable): Focally involved by carcinoma Fallopian Tube Surface Involvement (required only if applicable): Not identified Tumor Size: 12 cm Histologic Type: Endometrioid adenocarcinoma Histologic Grade: Grade II Implants (required for advanced stage serous/seromucinous borderline tumors only): Not applicable Other Tissue/ Organ Involvement: Cul de sac biopsy involved by tumor Largest Extrapelvic Peritoneal Focus (required only if applicable): Not applicable Peritoneal/Ascitic Fluid: Negative for carcinoma (case # [id number]) Treatment Effect (required only for high-grade serous carcinomas): Not applicable Regional Lymph Nodes: No lymph nodes submitted or found Number of Lymph Nodes Examined: 0 Pathologic Stage Classification (pTNM, AJCC 8th Edition): pT2b, pN0 Representative Tumor Block: 2B and 2E 1. Molecular study for microsatellite instability and immunohistochemical stains for MMR-related proteins are pending and will be reported in an addendum. 2. Immunohistochemical stain show that the ovarian tumor is positive for CK7 and PAX8 (both diffuse), CDX2 (patchy and weak); and negative for CK20. This immunoprofile is consistent with the above  diagnosis. Dana Hicks has reviewed this case and concurs with the above diagnosis. Molecular study for microsatellite instability and immunohistochemical stains for MMR-related proteins are pending and will be reported in an addendum    08/30/2017 Genetic Testing    Patient  has genetic testing done for MSI  Results revealed patient has the following mutation(s): loss of [facility] 2    08/30/2017 Surgery    Surgeon: Dana Piggs, MD Pre-operative Diagnosis:  1. Adnexal mass 2. Abnormal uterine bleeding 3. H/o Cecal CA  Post-operative Diagnosis:  1. Adhesive disease post colon resection 2. Endometrial cancer NOS 3. Adenocarcinoma unknown origin, right ovary, suspicious for GI primary  Operation:  1. Lysis of adhesions ~30 minutes 2. Robotic-assisted laparoscopic total hysterectomy with right salpingo-oophorectomy and left salpingectomy 3. Left oophorectomy (RA-laparoscopic) 4. Pelvic washings  Findings: Adhesions of omentum to anterior abdominal wall. Enlarged cystic right ovary ~10cm. Uterus had small nodules on serosa near where right adnexa was intimate with the surface. No obvious intraoperative rupture of cyst, although in 2 areas the wall was thin and one of these areas had some bleeding. Slight scarring of left bladder dome to LUS/cervix. Uterus on frozen section c/w hyperplasia and a small focus of endometrial CA - no myo invasion, <2cm in size. Frozen section on the right adnexa was carcinoma, met from colon or possibly Gyn, favor GI primary, defer to permanent. Left ovary was WNL.      09/15/2017 Cancer Staging    Staging form: Corpus Uteri - Carcinoma and Carcinosarcoma, AJCC 8th Edition - Pathologic: FIGO Stage IA (pT1a, pN0, cM0) - Signed by Dana Lark, MD on 09/15/2017    09/26/2017 Procedure    Successful placement of a right internal jugular approach power injectable Port-A-Cath. The catheter is ready for immediate use.    11/07/2017 Imaging    1. Since 08/18/2017,  similar to slight decrease in size of a pancreatic body/tail junction lesion. Cross modality comparison relative to 09/16/2017 MRI is also grossly similar. 2. No evidence of metastatic disease. 3. Aortic Atherosclerosis (ICD10-I70.0).  4. Left nephrolithiasis.    11/18/2017 Genetic Testing    MSH2 c.2237dupT pathogenic mutation identified in the CancerNext panel.  The CancerNext gene panel offered by [employer] includes sequencing and rearrangement analysis for the following 34 genes:   APC, ATM, BARD1, BMPR1A, BRCA1, BRCA2, BRIP1, CDH1, CDK4, CDKN2A, CHEK2, DICER1, HOXB13, EPCAM, GREM1, MLH1, MRE11A, MSH2, MSH6, MUTYH, NBN, NF1, PALB2, PMS2, POLD1, POLE, PTEN, RAD50, RAD51C, RAD51D, SMAD4, SMARCA4, STK11, and TP53.  The report date is November 18, 2017.  MSH2 [id number] pathogenic mutation identified on somatic testing.  These results are consistent with a diagnosis of Lynch syndrome.    12/21/2017 -  Chemotherapy    The patient had palonosetron (ALOXI) injection 0.25 mg, 0.25 mg, Intravenous,  Once, 1 of 6 cycles Administration: 0.25 mg (01/03/2018) pegfilgrastim (NEULASTA) injection 6 mg, 6 mg, Subcutaneous, Once, 1 of 6 cycles Administration: 6 mg (01/05/2018) irinotecan (CAMPTOSAR) 300 mg in dextrose 5 % 500 mL chemo infusion, 145 mg/m2 = 320 mg (100 % of original dose 150 mg/m2), Intravenous,  Once, 1 of 6 cycles Dose modification: 150 mg/m2 (original dose 150 mg/m2, Cycle 1, Reason: Provider Judgment), 130 mg/m2 (original dose 150 mg/m2, Cycle 5, Reason: Provider Judgment, Comment: severe diarrhea ) Administration: 300 mg (01/03/2018) leucovorin 828 mg in dextrose 5 % 250 mL infusion, 400 mg/m2 = 828 mg, Intravenous,  Once, 1 of 6 cycles Administration: 828 mg (01/03/2018) oxaliplatin (ELOXATIN) 175 mg in dextrose 5 % 500 mL chemo infusion, 85 mg/m2 = 175 mg, Intravenous,  Once, 1 of 6 cycles Administration: 175 mg (01/03/2018) fosaprepitant (EMEND) 150 mg, dexamethasone  (DECADRON) 12 mg in sodium chloride 0.9 % 145 mL IVPB, , Intravenous,  Once, 0 of 5 cycles fluorouracil (ADRUCIL) 5,000 mg in sodium chloride 0.9 % 150 mL chemo infusion, 2,405 mg/m2 = 4,950 mg, Intravenous, 1 Day/Dose, 1 of 6 cycles Administration: 5,000 mg (01/03/2018)  for chemotherapy treatment.      Secondary malignant neoplasm of right ovary ([place])   08/23/2017 Tumor Marker    Patient's tumor was tested for the following markers: CA-125 Results of the tumor marker test revealed 139.8    08/31/2017 Initial Diagnosis    Secondary malignant neoplasm of right ovary ([place])    09/15/2017 Cancer Staging    Staging form: Ovary, Fallopian Tube, and Primary Peritoneal Carcinoma, AJCC 8th Edition - Pathologic: Stage IIB (pT2b, pN0, cM0) - Signed by Dana Lark, MD on 09/15/2017    09/27/2017 Imaging    No evidence of metastatic disease or other acute findings within the thorax.  4 cm low-attenuation mass in pancreatic tail, highly suspicious for pancreatic carcinoma. This is caused splenic vein thrombosis, with new venous collaterals in the left upper quadrant. Consider endoscopic ultrasound with FNA for tissue diagnosis.  Stable benign hepatic hemangioma.     09/27/2017 Tumor Marker    Patient's tumor was tested for the following markers: CA-125 Results of the tumor marker test revealed 39.4    11/02/2017 Tumor Marker    Patient's tumor was tested for the following markers: CA-125 Results of the tumor marker test revealed 19    11/07/2017 Tumor Marker    Patient's tumor was tested for the following markers: CA-125 Results of the tumor marker test revealed 18.3    11/18/2017 Genetic Testing    MSH2 c.2237dupT pathogenic mutation identified in the CancerNext panel.  The CancerNext gene panel offered by [employer] includes sequencing and rearrangement analysis for the following 34 genes:   APC, ATM, BARD1, BMPR1A, BRCA1, BRCA2, BRIP1, CDH1, CDK4, CDKN2A, CHEK2, DICER1, HOXB13, EPCAM, GREM1,  MLH1, MRE11A, MSH2, MSH6, MUTYH, NBN, NF1, PALB2, PMS2, POLD1, POLE, PTEN, RAD50, RAD51C, RAD51D, SMAD4, SMARCA4, STK11, and TP53.  The report date is November 18, 2017.  MSH2 [id number] pathogenic mutation identified on somatic testing.  These results are consistent with a diagnosis of Lynch syndrome.    12/15/2017 Tumor Marker    Patient's tumor was tested for the following markers: CA-125 Results of the tumor marker test revealed 57.3     Pancreatic cancer ([place])   10/13/2017 Pathology Results    Pancreas tail mass, endoscopic ultrasound-guided, fine needle aspiration II (smears and cell block): Adenocarcinoma    11/18/2017 Genetic Testing    MSH2 c.2237dupT pathogenic mutation identified in the CancerNext panel.  The CancerNext gene panel offered by [employer] includes sequencing and rearrangement analysis for the following 34 genes:   APC, ATM, BARD1, BMPR1A, BRCA1, BRCA2, BRIP1, CDH1, CDK4, CDKN2A, CHEK2, DICER1, HOXB13, EPCAM, GREM1, MLH1, MRE11A, MSH2, MSH6, MUTYH, NBN, NF1, PALB2, PMS2, POLD1, POLE, PTEN, RAD50, RAD51C, RAD51D, SMAD4, SMARCA4, STK11, and TP53.  The report date is November 18, 2017.  MSH2 [id number] pathogenic mutation identified on somatic testing.  These results are consistent with a diagnosis of Lynch syndrome.    11/24/2017 Pathology Results    A. "TAIL OF PANCREAS AND SPLEEN", DISTAL PANCREATECTOMY AND SPLENECTOMY: Invasive ductal adenocarcinoma, moderately to poorly differentiated with focal signet ring cell features, of pancreas (distal).   The carcinoma is 2.5 cm in greatest dimension grossly.   Treatment effects present in the form of fibrosis (50%). No lymphovascular or definite perineural invasion identified. All surgical margins are negative for tumor or high-grade  dysplasia. Adjacent mucinous neoplasm, most consistent with Intraductal papillary mucinous neoplasm (IPMN) with low-grade  dysplasia.   Uninvolved pancreas show atrophy and focal acute inflammation.   Twelve benign lymph nodes (0/12). Spleen with no significant histopathologic abnormalities.  PROCEDURE: distal pancreatectomy and splenectomy TUMOR SITE: distal pancreas TUMOR SIZE:  GREATEST DIMENSION: 2.5 cm  ADDITIONAL DIMENSIONS:  x  cm HISTOLOGIC TYPE: ductal adenocarcinoma HISTOLOGIC GRADE: grade 3 TUMOR EXTENSION: peripancreatic soft tissue MARGINS: negative for tumor TREATMENT EFFECT: treatment effects present in the form of fibrosis (50%). LYMPHOVASCULAR INVASION: not identified PERINEURAL INVASION: no definite evidence REGIONAL LYMPH NODES:   NUMBER OF LYMPH NODES INVOLVED: 0   NUMBER OF LYMPH NODES EXAMINED: 12 PATHOLOGIC STAGE CLASSIFICATION (pTNM, AJCC 8th Ed): ypT2, ypN0 DISTANT METASTASIS (pM): pMx ADDITIONAL PATHOLOGIC FINDINGS: mucinous neoplasm, most consistent with intraductal papillary mucinous neoplasm (IPMN) with low-grade dysplasia, is identified adjacent to the main tumor.    11/24/2017 Cancer Staging    Staging form: Exocrine Pancreas, AJCC 8th Edition - Pathologic stage from 11/24/2017: Stage IB (pT2, pN0, cM0) - Signed by Truitt Merle, MD on 01/03/2018    11/25/2017 Surgery    She had surgery at [facility] 1. Exploratory Laparotomy 2. Distal Pancreatectomy and Splenectomy 3. Intraoperative Ultrasound 4. Open Cholecystectomy     12/15/2017 Cancer Staging    Staging form: Exocrine Pancreas, AJCC 8th Edition - Clinical: Stage IB (cT2, cN0, cM0) - Signed by Dana Lark, MD on 12/15/2017    12/21/2017 -  Chemotherapy    The patient had palonosetron (ALOXI) injection 0.25 mg, 0.25 mg, Intravenous,  Once, 1 of 6 cycles Administration: 0.25 mg (01/03/2018) pegfilgrastim (NEULASTA) injection 6 mg, 6 mg, Subcutaneous, Once, 1 of 6 cycles Administration: 6 mg (01/05/2018) irinotecan (CAMPTOSAR) 300 mg in dextrose 5 % 500 mL chemo infusion, 145 mg/m2 = 320  mg (100 % of original dose 150 mg/m2), Intravenous,  Once, 1 of 6 cycles Dose modification: 150 mg/m2 (original dose 150 mg/m2, Cycle 1, Reason: Provider Judgment), 130 mg/m2 (original dose 150 mg/m2, Cycle 5, Reason: Provider Judgment, Comment: severe diarrhea ) Administration: 300 mg (01/03/2018) leucovorin 828 mg in dextrose 5 % 250 mL infusion, 400 mg/m2 = 828 mg, Intravenous,  Once, 1 of 6 cycles Administration: 828 mg (01/03/2018) oxaliplatin (ELOXATIN) 175 mg in dextrose 5 % 500 mL chemo infusion, 85 mg/m2 = 175 mg, Intravenous,  Once, 1 of 6 cycles Administration: 175 mg (01/03/2018) fosaprepitant (EMEND) 150 mg, dexamethasone (DECADRON) 12 mg in sodium chloride 0.9 % 145 mL IVPB, , Intravenous,  Once, 0 of 5 cycles fluorouracil (ADRUCIL) 5,000 mg in sodium chloride 0.9 % 150 mL chemo infusion, 2,405 mg/m2 = 4,950 mg, Intravenous, 1 Day/Dose, 1 of 6 cycles Administration: 5,000 mg (01/03/2018)  for chemotherapy treatment.     12/28/2017 Imaging    12/28/2017 CT Abdomen  IMPRESSION: 1. Postoperative findings from recent partial pancreatectomy including a 21 cubic cm fluid collection along the pancreatic resection margin which could represent early pseudocyst. 2. Nodularity along the lateral limb of the left adrenal gland could also be postoperative but merit surveillance, as the pancreatic lesion was in close proximity to this adrenal gland on the prior CT of 11/07/2017. 3. Asymmetric fullness inferiorly in the left breast. The patient has a history of prior left breast procedures, correlation with mammography is recommended. 4. Other imaging findings of potential clinical significance: Stable hemangioma in the left hepatic lobe. Splenectomy. Aortic Atherosclerosis (ICD10-I70.0). Right hemicolectomy. Small focus of fat necrosis in the right anterior abdominal  wall subcutaneous tissues near the laparotomy site. Bilateral nonobstructive nephrolithiasis.    CURRENT THERAPY  adjuvant FOLFIRINOX every 2 weeks with G-CSF  INTERVAL HISTORY: Dana Hicks is a 42 y.o. female who is here for follow-up. I see her today in Dr. Calton Dach absence.   She is here today with her son's girlfriend Judson Roch. She is doing well over all and states that she takes 6-8 pills of imodium for her diarrhea. She has abdominal pain. Her appetite is not as good and she is not eating well. She denies fever or chills. She is drinking well and uses Ensure clear.  Pertinent positives and negatives of review of systems are listed and detailed within the above HPI.   REVIEW OF SYSTEMS:  Constitutional: Denies fevers, chills or abnormal weight loss Eyes: Denies blurriness of vision Ears, nose, mouth, throat, and face: Denies mucositis or sore throat Respiratory: Denies cough, dyspnea or wheezes Cardiovascular: Denies palpitation, chest discomfort or lower extremity swelling Gastrointestinal:  Denies nausea, heartburn (+) diarrhea (+) abdominal pain Skin: Denies abnormal skin rashes Lymphatics: Denies new lymphadenopathy or easy bruising Neurological:Denies numbness, tingling or new weaknesses Behavioral/Psych: Mood is stable, no new changes  All other systems were reviewed with the patient and are negative.  MEDICAL HISTORY:  Past Medical History:  Diagnosis Date  . Allergic rhinitis, seasonal   . Cecal cancer ([place]) 2001   Stage II (T3N0)  04-11-2001cecum-partial colectomy and completed chemo 2002  . Endometrial cancer ([place]) 08/2017   Stage IA, Grade 1  . History of MRSA infection 01/2017   followed by infectious disease center--  recurrent pustular folliculitis  . Nephrolithiasis    bilateral nonobstructive calculi per CT 08-18-2017  . OA (osteoarthritis)   . Ovarian cancer, right ([place]) 08/2017   Stage II Grade 2 Endometrioid  . Pancreatic cancer ([place])   . Rheumatoid arthritis [facility])    rheumatologist-  dr devenswar-- treated w/ oral prednisone daily and methotrexate injection  every 3 wks    SURGICAL HISTORY: Past Surgical History:  Procedure Laterality Date  . BREAST LUMPECTOMY WITH RADIOACTIVE SEED LOCALIZATION Left 06/28/2014   Benign Procedure: RADIOACTIVE SEED LOCALIZATION LEFT BREAST LUMPECTOMY;  Surgeon: Excell Seltzer, MD;  Location: [facility];  Service: General;  Laterality: Left;  . COLONOSCOPY  06/19/14  . EYE SURGERY Left    plug in tear duct  . IR IMAGING GUIDED PORT INSERTION  09/26/2017  . KNEE ARTHROSCOPY    . PANCREATECTOMY  11/25/2017  . PORT A CATH REVISION  2001   in and out  . RIGHT COLECTOMY  06/02/2009   cecum cancer  . ROBOTIC ASSISTED TOTAL HYSTERECTOMY WITH BILATERAL SALPINGO OOPHERECTOMY Bilateral 08/30/2017   Procedure: XI ROBOTIC ASSISTED TOTAL  HYSTERECTOMY BILATERAL  SALPINGO OOPHORECTOMY; LYSIS OF ADHESIONS;  Surgeon: Isabel Caprice, MD;  Location: [facility];  Service: Gynecology;  Laterality: Bilateral;  . SPLENECTOMY, PARTIAL  11/25/2017    I have reviewed the social history and family history with the patient and they are unchanged from previous note.  ALLERGIES:  is allergic to codeine; penicillins; and bactrim [sulfamethoxazole-trimethoprim].  MEDICATIONS:  Current Outpatient Medications  Medication Sig Dispense Refill  . acetaminophen (TYLENOL) 650 MG CR tablet Take 1,300 mg by mouth every 8 (eight) hours.    Marland Kitchen dicyclomine (BENTYL) 10 MG capsule Take 1 capsule (10 mg total) by mouth every 8 (eight) hours as needed for spasms. 20 capsule 0  . diphenoxylate-atropine (LOMOTIL) 2.5-0.025 MG tablet 1 to 2 po QID prn  diarrhea 40 tablet 1  . HYDROmorphone (DILAUDID) 4 MG tablet Take 1 tablet (4 mg total) by mouth every 4 (four) hours as needed for severe pain. 30 tablet 0  . lidocaine-prilocaine (EMLA) cream Apply to affected area once (Patient taking differently: Apply 1 application topically daily as needed (port). Apply to affected area once) 30 g 3  . loratadine (CLARITIN) 10 MG tablet Take 10 mg by mouth  daily after breakfast.    . LORazepam (ATIVAN) 0.5 MG tablet Place 1 tablet (0.5 mg total) under the tongue every 6 (six) hours as needed for anxiety. 30 tablet 1  . meclizine (MEDI-MECLIZINE) 25 MG tablet Take 1 tablet (25 mg total) by mouth 3 (three) times daily as needed for dizziness. 30 tablet 1  . naproxen sodium (ALEVE) 220 MG tablet Take 220 mg by mouth.    Marland Kitchen omeprazole (PRILOSEC) 20 MG capsule Take 20 mg by mouth 2 (two) times daily before a meal.     . ondansetron (ZOFRAN) 8 MG tablet Take 1 tablet (8 mg total) by mouth every 8 (eight) hours as needed for refractory nausea / vomiting. Start on day 3 after chemo. 30 tablet 1  . predniSONE (DELTASONE) 10 MG tablet TAKE 1 TABLET BY MOUTH EVERY DAY WITH BREAKFAST (Patient taking differently: Take 5 mg by mouth daily with breakfast. ) 30 tablet 0  . predniSONE (DELTASONE) 5 MG tablet Take 1 tablet (5 mg total) by mouth daily with breakfast. 30 tablet 1  . prochlorperazine (COMPAZINE) 10 MG tablet Take 1 tablet (10 mg total) by mouth every 6 (six) hours as needed (Nausea or vomiting). 30 tablet 1  . simethicone (MYLICON) 381 MG chewable tablet Chew 125 mg by mouth every 6 (six) hours as needed for flatulence.    . traMADol (ULTRAM) 50 MG tablet Take 1-2 tablets (50-100 mg total) by mouth every 6 (six) hours as needed. 90 tablet 0   Current Facility-Administered Medications  Medication Dose Route Frequency Provider Last Rate Last Dose  . sodium chloride 0.9 % 1,000 mL with potassium chloride 20 mEq infusion   Intravenous Continuous Truitt Merle, MD   Stopped at 01/16/18 1326    PHYSICAL EXAMINATION: ECOG PERFORMANCE STATUS: 2 - Symptomatic, <50% confined to bed  Vitals:   01/16/18 1020 01/16/18 1023  BP: (!) 133/106 (!) 141/105  Pulse: (!) 144   Resp: 17   Temp: 98.4 F (36.9 C)   SpO2: 94%    Filed Weights   01/16/18 1020  Weight: 205 lb 11.2 oz (93.3 kg)    GENERAL:alert, no distress and comfortable (+) hair loss SKIN: skin  color, texture, turgor are normal, no rashes or significant lesions EYES: normal, Conjunctiva are pink and non-injected, sclera clear OROPHARYNX:no exudate, no erythema and lips, buccal mucosa, and tongue normal  NECK: supple, thyroid normal size, non-tender, without nodularity LYMPH:  no palpable lymphadenopathy in the cervical, axillary or inguinal LUNGS: clear to auscultation and percussion with normal breathing effort HEART: regular rate & rhythm and no murmurs and no lower extremity edema ABDOMEN:abdomen soft, non-tender and normal bowel sounds Musculoskeletal:no cyanosis of digits and no clubbing  NEURO: alert & oriented x 3 with fluent speech, no focal motor/sensory deficits  LABORATORY DATA:  I have reviewed the data as listed CBC Latest Ref Rng & Units 01/16/2018 01/11/2018 01/05/2018  WBC 4.0 - 10.5 K/uL 14.0(H) 4.2 4.4  Hemoglobin 12.0 - 15.0 g/dL 11.5(L) 11.8(L) 12.0  Hematocrit 36.0 - 46.0 % 37.5 39.7 39.9  Platelets  150 - 400 K/uL 337 331 521(H)     CMP Latest Ref Rng & Units 01/16/2018 01/11/2018 01/05/2018  Glucose 70 - 99 mg/dL 128(H) 116(H) 127(H)  BUN 6 - 20 mg/dL '6 9 12  ' Creatinine 0.44 - 1.00 mg/dL 0.70 0.77 0.75  Sodium 135 - 145 mmol/L 139 139 138  Potassium 3.5 - 5.1 mmol/L 2.8(LL) 3.4(L) 3.8  Chloride 98 - 111 mmol/L 99 103 96(L)  CO2 22 - 32 mmol/L '26 25 28  ' Calcium 8.9 - 10.3 mg/dL 9.0 8.9 10.1  Total Protein 6.5 - 8.1 g/dL 6.5 6.6 7.5  Total Bilirubin 0.3 - 1.2 mg/dL <0.2(L) 0.3 0.6  Alkaline Phos 38 - 126 U/L 73 90 63  AST 15 - 41 U/L 13(L) 30 50(H)  ALT 0 - 44 U/L 32 82(H) 88(H)    RADIOGRAPHIC STUDIES: I have personally reviewed the radiological images as listed and agreed with the findings in the report. No results found.   ASSESSMENT & PLAN:  BRIAUNNA GRINDSTAFF is a 42 y.o. female with history of   1. Pancreatic adenocarcinoma in tail, pT2N0M0, stage IB -she has had complete surgical resection  -she is currently on adjuvant FOLFIRINOX every  2 weeks with G-CSF on day 3. -He unfortunately developed a severe diarrhea, nausea, dehydration, no pain, poor oral intake, after first cycle of chemo, required frequent IV fluids and supportive care. -She has recovered to better this week, but is still quite fatigued.  I recommend postponing chemotherapy to Saturday or next Monday.  IV fluids today. -We reviewed management of nausea, and a diarrhea again -Labs reviewed, CBC showed Hg 11.5 WBC 14K. CMP, CA 125 and Mg pending -I advised her to increase her Ensure intake to help meet her nutritional needs. -f/u with IVF next week with APP -lab, flush, f/u with Dr. Alvy Bimler and chemo cycle 2 in 3 weeks, with irinotecan dose reduction   2. Nausea, diarrhea, anorexia and dehydration  -Secondary to chemotherapy -Improved, continue IV fluids and supportive care.  3. IDA -Labs reviewed, CBC showed Hg 11.5, stable   4. Secondary malignant neoplasm of right ovary ([place]) - She previously had surgery and 2 cycled on Carbo/Taxol chemo - Her chemo is currently on hold to complete treatment for pancreatic cancer -She will f/u with OB/GYN  5. RA with negative RF -She will continue conservative therapy -She can resume 10 mg prednisone if needed  6. History of splenectomy -She is up-to-date with her recommended vaccinations  7. Lynch Syndrome -She has developed colon, pancreatic, and ovarian cancers -She will continue recommended cancer screening  8. Socioeconomic stresses -I previously referred with SW -she is applying disability   Plan -Will hold chemo today -Lab, flush and f/u with aAPP on 11/29 and chemo FOLFIRINOX on 11/30 with pump d/c on 12/2 -f/u next week with an NP or me -see Dr. Alvy Bimler in 3 weeks before cycle 3 chemo     No problem-specific Assessment & Plan notes found for this encounter.   No orders of the defined types were placed in this encounter.  All questions were answered. The patient knows to call the clinic with  any problems, questions or concerns. No barriers to learning was detected. I spent 20 minutes counseling the patient face to face. The total time spent in the appointment was 25 minutes and more than 50% was on counseling and review of test results  I, Noor Dweik am acting as scribe for Dr. Truitt Merle.  I have reviewed the  above documentation for accuracy and completeness, and I agree with the above.       Truitt Merle, MD 01/16/2018

## 2018-01-16 ENCOUNTER — Inpatient Hospital Stay: Payer: BLUE CROSS/BLUE SHIELD

## 2018-01-16 ENCOUNTER — Inpatient Hospital Stay (HOSPITAL_BASED_OUTPATIENT_CLINIC_OR_DEPARTMENT_OTHER): Payer: BLUE CROSS/BLUE SHIELD | Admitting: Hematology

## 2018-01-16 ENCOUNTER — Encounter: Payer: Self-pay | Admitting: Hematology

## 2018-01-16 VITALS — BP 141/105 | HR 144 | Temp 98.4°F | Resp 17 | Ht 62.0 in | Wt 205.7 lb

## 2018-01-16 DIAGNOSIS — C252 Malignant neoplasm of tail of pancreas: Secondary | ICD-10-CM

## 2018-01-16 DIAGNOSIS — C7961 Secondary malignant neoplasm of right ovary: Secondary | ICD-10-CM

## 2018-01-16 DIAGNOSIS — D638 Anemia in other chronic diseases classified elsewhere: Secondary | ICD-10-CM | POA: Diagnosis not present

## 2018-01-16 DIAGNOSIS — R52 Pain, unspecified: Secondary | ICD-10-CM | POA: Diagnosis not present

## 2018-01-16 DIAGNOSIS — E860 Dehydration: Secondary | ICD-10-CM

## 2018-01-16 DIAGNOSIS — Z90411 Acquired partial absence of pancreas: Secondary | ICD-10-CM | POA: Diagnosis not present

## 2018-01-16 DIAGNOSIS — D509 Iron deficiency anemia, unspecified: Secondary | ICD-10-CM

## 2018-01-16 DIAGNOSIS — M069 Rheumatoid arthritis, unspecified: Secondary | ICD-10-CM | POA: Diagnosis not present

## 2018-01-16 DIAGNOSIS — Z90710 Acquired absence of both cervix and uterus: Secondary | ICD-10-CM | POA: Diagnosis not present

## 2018-01-16 DIAGNOSIS — Z95828 Presence of other vascular implants and grafts: Secondary | ICD-10-CM

## 2018-01-16 DIAGNOSIS — E669 Obesity, unspecified: Secondary | ICD-10-CM | POA: Diagnosis not present

## 2018-01-16 DIAGNOSIS — Z90722 Acquired absence of ovaries, bilateral: Secondary | ICD-10-CM | POA: Diagnosis not present

## 2018-01-16 DIAGNOSIS — C541 Malignant neoplasm of endometrium: Secondary | ICD-10-CM

## 2018-01-16 DIAGNOSIS — E876 Hypokalemia: Secondary | ICD-10-CM

## 2018-01-16 DIAGNOSIS — I700 Atherosclerosis of aorta: Secondary | ICD-10-CM | POA: Diagnosis not present

## 2018-01-16 DIAGNOSIS — Z5111 Encounter for antineoplastic chemotherapy: Secondary | ICD-10-CM | POA: Diagnosis not present

## 2018-01-16 DIAGNOSIS — Z79899 Other long term (current) drug therapy: Secondary | ICD-10-CM | POA: Diagnosis not present

## 2018-01-16 LAB — CBC WITH DIFFERENTIAL/PLATELET
Abs Immature Granulocytes: 1.37 10*3/uL — ABNORMAL HIGH (ref 0.00–0.07)
Basophils Absolute: 0.1 10*3/uL (ref 0.0–0.1)
Basophils Relative: 0 %
Eosinophils Absolute: 0 10*3/uL (ref 0.0–0.5)
Eosinophils Relative: 0 %
HCT: 37.5 % (ref 36.0–46.0)
Hemoglobin: 11.5 g/dL — ABNORMAL LOW (ref 12.0–15.0)
Immature Granulocytes: 10 %
Lymphocytes Relative: 20 %
Lymphs Abs: 2.8 10*3/uL (ref 0.7–4.0)
MCH: 27.2 pg (ref 26.0–34.0)
MCHC: 30.7 g/dL (ref 30.0–36.0)
MCV: 88.7 fL (ref 80.0–100.0)
Monocytes Absolute: 1.5 10*3/uL — ABNORMAL HIGH (ref 0.1–1.0)
Monocytes Relative: 11 %
Neutro Abs: 8.3 10*3/uL — ABNORMAL HIGH (ref 1.7–7.7)
Neutrophils Relative %: 59 %
Platelets: 337 10*3/uL (ref 150–400)
RBC: 4.23 MIL/uL (ref 3.87–5.11)
RDW: 17.8 % — ABNORMAL HIGH (ref 11.5–15.5)
WBC: 14 10*3/uL — ABNORMAL HIGH (ref 4.0–10.5)
nRBC: 1.1 % — ABNORMAL HIGH (ref 0.0–0.2)

## 2018-01-16 LAB — COMPREHENSIVE METABOLIC PANEL
ALT: 32 U/L (ref 0–44)
AST: 13 U/L — ABNORMAL LOW (ref 15–41)
Albumin: 2.7 g/dL — ABNORMAL LOW (ref 3.5–5.0)
Alkaline Phosphatase: 73 U/L (ref 38–126)
Anion gap: 14 (ref 5–15)
BUN: 6 mg/dL (ref 6–20)
CO2: 26 mmol/L (ref 22–32)
Calcium: 9 mg/dL (ref 8.9–10.3)
Chloride: 99 mmol/L (ref 98–111)
Creatinine, Ser: 0.7 mg/dL (ref 0.44–1.00)
GFR calc Af Amer: >60 mL/min (ref >60)
GFR calc non Af Amer: >60 mL/min (ref >60)
Glucose, Bld: 128 mg/dL — ABNORMAL HIGH (ref 70–99)
Potassium: 2.8 mmol/L — CL (ref 3.5–5.1)
Sodium: 139 mmol/L (ref 135–145)
Total Bilirubin: <0.2 mg/dL — ABNORMAL LOW (ref 0.3–1.2)
Total Protein: 6.5 g/dL (ref 6.5–8.1)

## 2018-01-16 LAB — MAGNESIUM: Magnesium: 1.7 mg/dL (ref 1.7–2.4)

## 2018-01-16 MED ORDER — SODIUM CHLORIDE 0.9 % IV SOLN
INTRAVENOUS | Status: DC
  Administered 2018-01-16: 11:00:00 via INTRAVENOUS
  Filled 2018-01-16 (×2): qty 250

## 2018-01-16 MED ORDER — HEPARIN SOD (PORK) LOCK FLUSH 100 UNIT/ML IV SOLN
500.0000 [IU] | Freq: Once | INTRAVENOUS | Status: DC
  Filled 2018-01-16: qty 5

## 2018-01-16 MED ORDER — PALONOSETRON HCL INJECTION 0.25 MG/5ML
INTRAVENOUS | Status: AC
  Filled 2018-01-16: qty 5

## 2018-01-16 MED ORDER — POTASSIUM CHLORIDE CRYS ER 20 MEQ PO TBCR
EXTENDED_RELEASE_TABLET | ORAL | Status: AC
  Filled 2018-01-16: qty 1

## 2018-01-16 MED ORDER — POTASSIUM CHLORIDE CRYS ER 20 MEQ PO TBCR
20.0000 meq | EXTENDED_RELEASE_TABLET | Freq: Once | ORAL | Status: AC
  Administered 2018-01-16: 20 meq via ORAL

## 2018-01-16 MED ORDER — SODIUM CHLORIDE 0.9 % IV SOLN
INTRAVENOUS | Status: DC
  Administered 2018-01-16: 12:00:00 via INTRAVENOUS
  Filled 2018-01-16 (×2): qty 1000

## 2018-01-16 MED ORDER — SODIUM CHLORIDE 0.9% FLUSH
10.0000 mL | Freq: Once | INTRAVENOUS | Status: AC
  Administered 2018-01-16: 10 mL
  Filled 2018-01-16: qty 10

## 2018-01-16 NOTE — Patient Instructions (Signed)
Dehydration, Adult Dehydration is when there is not enough fluid or water in your body. This happens when you lose more fluids than you take in. Dehydration can range from mild to very bad. It should be treated right away to keep it from getting very bad. Symptoms of mild dehydration may include:  Thirst.  Dry lips.  Slightly dry mouth.  Dry, warm skin.  Dizziness. Symptoms of moderate dehydration may include:  Very dry mouth.  Muscle cramps.  Dark pee (urine). Pee may be the color of tea.  Your body making less pee.  Your eyes making fewer tears.  Heartbeat that is uneven or faster than normal (palpitations).  Headache.  Light-headedness, especially when you stand up from sitting.  Fainting (syncope). Symptoms of very bad dehydration may include:  Changes in skin, such as: ? Cold and clammy skin. ? Blotchy (mottled) or pale skin. ? Skin that does not quickly return to normal after being lightly pinched and let go (poor skin turgor).  Changes in body fluids, such as: ? Feeling very thirsty. ? Your eyes making fewer tears. ? Not sweating when body temperature is high, such as in hot weather. ? Your body making very little pee.  Changes in vital signs, such as: ? Weak pulse. ? Pulse that is more than 100 beats a minute when you are sitting still. ? Fast breathing. ? Low blood pressure.  Other changes, such as: ? Sunken eyes. ? Cold hands and feet. ? Confusion. ? Lack of energy (lethargy). ? Trouble waking up from sleep. ? Short-term weight loss. ? Unconsciousness. Follow these instructions at home:  If told by your doctor, drink an ORS: ? Make an ORS by using instructions on the package. ? Start by drinking small amounts, about  cup (120 mL) every 5-10 minutes. ? Slowly drink more until you have had the amount that your doctor said to have.  Drink enough clear fluid to keep your pee clear or pale yellow. If you were told to drink an ORS, finish the ORS  first, then start slowly drinking clear fluids. Drink fluids such as: ? Water. Do not drink only water by itself. Doing that can make the salt (sodium) level in your body get too low (hyponatremia). ? Ice chips. ? Fruit juice that you have added water to (diluted). ? Low-calorie sports drinks.  Avoid: ? Alcohol. ? Drinks that have a lot of sugar. These include high-calorie sports drinks, fruit juice that does not have water added, and soda. ? Caffeine. ? Foods that are greasy or have a lot of fat or sugar.  Take over-the-counter and prescription medicines only as told by your doctor.  Do not take salt tablets. Doing that can make the salt level in your body get too high (hypernatremia).  Eat foods that have minerals (electrolytes). Examples include bananas, oranges, potatoes, tomatoes, and spinach.  Keep all follow-up visits as told by your doctor. This is important. Contact a doctor if:  You have belly (abdominal) pain that: ? Gets worse. ? Stays in one area (localizes).  You have a rash.  You have a stiff neck.  You get angry or annoyed more easily than normal (irritability).  You are more sleepy than normal.  You have a harder time waking up than normal.  You feel: ? Weak. ? Dizzy. ? Very thirsty.  You have peed (urinated) only a small amount of very dark pee during 6-8 hours. Get help right away if:  You have symptoms of   very bad dehydration.  You cannot drink fluids without throwing up (vomiting).  Your symptoms get worse with treatment.  You have a fever.  You have a very bad headache.  You are throwing up or having watery poop (diarrhea) and it: ? Gets worse. ? Does not go away.  You have blood or something green (bile) in your throw-up.  You have blood in your poop (stool). This may cause poop to look black and tarry.  You have not peed in 6-8 hours.  You pass out (faint).  Your heart rate when you are sitting still is more than 100 beats a  minute.  You have trouble breathing. This information is not intended to replace advice given to you by your health care provider. Make sure you discuss any questions you have with your health care provider. Document Released: 12/05/2008 Document Revised: 08/29/2015 Document Reviewed: 04/04/2015 Elsevier Interactive Patient Education  2018 [employer]  

## 2018-01-17 LAB — STOOL CULTURE: E coli, Shiga toxin Assay: NEGATIVE

## 2018-01-17 LAB — STOOL CULTURE REFLEX - CMPCXR

## 2018-01-17 LAB — CA 125: Cancer Antigen (CA) 125: 24.2 U/mL (ref 0.0–38.1)

## 2018-01-17 LAB — STOOL CULTURE REFLEX - RSASHR

## 2018-01-18 ENCOUNTER — Inpatient Hospital Stay: Payer: BLUE CROSS/BLUE SHIELD

## 2018-01-20 ENCOUNTER — Other Ambulatory Visit: Payer: BLUE CROSS/BLUE SHIELD

## 2018-01-20 ENCOUNTER — Inpatient Hospital Stay: Payer: BLUE CROSS/BLUE SHIELD

## 2018-01-20 ENCOUNTER — Other Ambulatory Visit: Payer: Self-pay | Admitting: Hematology and Oncology

## 2018-01-20 ENCOUNTER — Encounter: Payer: Self-pay | Admitting: Oncology

## 2018-01-20 ENCOUNTER — Inpatient Hospital Stay (HOSPITAL_BASED_OUTPATIENT_CLINIC_OR_DEPARTMENT_OTHER): Payer: BLUE CROSS/BLUE SHIELD | Admitting: Oncology

## 2018-01-20 VITALS — BP 144/95 | HR 100 | Temp 97.9°F | Resp 18 | Ht 62.0 in | Wt 211.8 lb

## 2018-01-20 DIAGNOSIS — D638 Anemia in other chronic diseases classified elsewhere: Secondary | ICD-10-CM | POA: Diagnosis not present

## 2018-01-20 DIAGNOSIS — R52 Pain, unspecified: Secondary | ICD-10-CM | POA: Diagnosis not present

## 2018-01-20 DIAGNOSIS — C7961 Secondary malignant neoplasm of right ovary: Secondary | ICD-10-CM | POA: Diagnosis not present

## 2018-01-20 DIAGNOSIS — D509 Iron deficiency anemia, unspecified: Secondary | ICD-10-CM | POA: Diagnosis not present

## 2018-01-20 DIAGNOSIS — Z5111 Encounter for antineoplastic chemotherapy: Secondary | ICD-10-CM | POA: Diagnosis not present

## 2018-01-20 DIAGNOSIS — I700 Atherosclerosis of aorta: Secondary | ICD-10-CM | POA: Diagnosis not present

## 2018-01-20 DIAGNOSIS — C252 Malignant neoplasm of tail of pancreas: Secondary | ICD-10-CM | POA: Diagnosis not present

## 2018-01-20 DIAGNOSIS — E876 Hypokalemia: Secondary | ICD-10-CM | POA: Diagnosis not present

## 2018-01-20 DIAGNOSIS — Z90722 Acquired absence of ovaries, bilateral: Secondary | ICD-10-CM | POA: Diagnosis not present

## 2018-01-20 DIAGNOSIS — C541 Malignant neoplasm of endometrium: Secondary | ICD-10-CM | POA: Diagnosis not present

## 2018-01-20 DIAGNOSIS — Z9081 Acquired absence of spleen: Secondary | ICD-10-CM

## 2018-01-20 DIAGNOSIS — D500 Iron deficiency anemia secondary to blood loss (chronic): Secondary | ICD-10-CM | POA: Diagnosis not present

## 2018-01-20 DIAGNOSIS — M069 Rheumatoid arthritis, unspecified: Secondary | ICD-10-CM | POA: Diagnosis not present

## 2018-01-20 DIAGNOSIS — Z90411 Acquired partial absence of pancreas: Secondary | ICD-10-CM | POA: Diagnosis not present

## 2018-01-20 DIAGNOSIS — Z90710 Acquired absence of both cervix and uterus: Secondary | ICD-10-CM | POA: Diagnosis not present

## 2018-01-20 DIAGNOSIS — Z79899 Other long term (current) drug therapy: Secondary | ICD-10-CM | POA: Diagnosis not present

## 2018-01-20 DIAGNOSIS — E860 Dehydration: Secondary | ICD-10-CM | POA: Diagnosis not present

## 2018-01-20 DIAGNOSIS — E669 Obesity, unspecified: Secondary | ICD-10-CM | POA: Diagnosis not present

## 2018-01-20 LAB — CBC WITH DIFFERENTIAL/PLATELET
Abs Immature Granulocytes: 1.81 10*3/uL — ABNORMAL HIGH (ref 0.00–0.07)
Basophils Absolute: 0.1 10*3/uL (ref 0.0–0.1)
Basophils Relative: 1 %
Eosinophils Absolute: 0.1 10*3/uL (ref 0.0–0.5)
Eosinophils Relative: 0 %
HCT: 34.1 % — ABNORMAL LOW (ref 36.0–46.0)
Hemoglobin: 10.2 g/dL — ABNORMAL LOW (ref 12.0–15.0)
Immature Granulocytes: 11 %
Lymphocytes Relative: 27 %
Lymphs Abs: 4.5 10*3/uL — ABNORMAL HIGH (ref 0.7–4.0)
MCH: 27.1 pg (ref 26.0–34.0)
MCHC: 29.9 g/dL — ABNORMAL LOW (ref 30.0–36.0)
MCV: 90.5 fL (ref 80.0–100.0)
Monocytes Absolute: 0.8 10*3/uL (ref 0.1–1.0)
Monocytes Relative: 5 %
Neutro Abs: 9.7 10*3/uL — ABNORMAL HIGH (ref 1.7–7.7)
Neutrophils Relative %: 56 %
Platelets: 599 10*3/uL — ABNORMAL HIGH (ref 150–400)
RBC: 3.77 MIL/uL — ABNORMAL LOW (ref 3.87–5.11)
RDW: 18.6 % — ABNORMAL HIGH (ref 11.5–15.5)
WBC: 16.9 10*3/uL — ABNORMAL HIGH (ref 4.0–10.5)
nRBC: 0.8 % — ABNORMAL HIGH (ref 0.0–0.2)

## 2018-01-20 LAB — COMPREHENSIVE METABOLIC PANEL
ALT: 20 U/L (ref 0–44)
AST: 7 U/L — ABNORMAL LOW (ref 15–41)
Albumin: 2.7 g/dL — ABNORMAL LOW (ref 3.5–5.0)
Alkaline Phosphatase: 62 U/L (ref 38–126)
Anion gap: 13 (ref 5–15)
BUN: 9 mg/dL (ref 6–20)
CO2: 23 mmol/L (ref 22–32)
Calcium: 8.5 mg/dL — ABNORMAL LOW (ref 8.9–10.3)
Chloride: 106 mmol/L (ref 98–111)
Creatinine, Ser: 0.65 mg/dL (ref 0.44–1.00)
GFR calc Af Amer: >60 mL/min (ref >60)
GFR calc non Af Amer: >60 mL/min (ref >60)
Glucose, Bld: 144 mg/dL — ABNORMAL HIGH (ref 70–99)
Potassium: 3.4 mmol/L — ABNORMAL LOW (ref 3.5–5.1)
Sodium: 142 mmol/L (ref 135–145)
Total Bilirubin: <0.2 mg/dL — ABNORMAL LOW (ref 0.3–1.2)
Total Protein: 5.9 g/dL — ABNORMAL LOW (ref 6.5–8.1)

## 2018-01-20 LAB — MAGNESIUM: Magnesium: 1.8 mg/dL (ref 1.7–2.4)

## 2018-01-20 NOTE — Assessment & Plan Note (Signed)
This is a pleasant 42 year old female with pancreatic cancer.  Currently receiving adjuvant chemotherapy.  She is status post 1 cycle of FOLFIRINOX.  She had difficulty with nausea, vomiting, and diarrhea following her first cycle.  She required IV hydration. The start of cycle 2 has been delayed due to side effects.  He is feeling much better today.  Diarrhea has improved.  She would like to proceed with chemotherapy tomorrow as scheduled.  Labs from today have been reviewed and are adequate for treatment.  Irinotecan dose will be reduced with cycle #2.  She has mild hypokalemia and we discussed increasing the potassium in her diet.  We reviewed how to take her Imodium and Lomotil.  She has antiemetics available to her.  The patient will follow-up next week for evaluation and repeat lab work.  She was advised to call us if she has any issues prior to that and we can see her in the symptom management clinic.

## 2018-01-20 NOTE — Assessment & Plan Note (Signed)
She has received vaccination postsplenectomy.  Her platelet count is likely reflective of postoperative changes.  White blood cell count is also elevated likely due to splenectomy and recent G-CSF support.

## 2018-01-20 NOTE — Progress Notes (Signed)
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 Mixed attenuation mass emanates from the right adnexa measuring 10.8 x 8.0 cm very suspicious for right ovarian carcinoma. 2. Abnormality of the tail of the pancreas may be due to mild pancreatitis and small pseudocyst formation, but neoplasm cannot be excluded. 3. Small amount of ascites within abdomen and pelvis. 4. Small nonobstructing renal calculi bilaterally.     08/30/2017 Pathology Results    1. Uterus and cervix, with left fallopian tube - ENDOMETRIOID ADENOCARCINOMA, FIGO GRADE I, ARISING IN A BACKGROUND OF DIFFUSE COMPLEX ATYPICAL HYPERPLASIA. - CARCINOMA INVADES FOR OF DEPTH OF 0.2 CM WHERE THICKNESS OF MYOMETRIAL WALL IS 2.1 CM. - ALL RESECTION MARGINS ARE NEGATIVE FOR CARCINOMA. - NEGATIVE FOR LYMPHOVASCULAR OR PERINEURAL INVASION. - CERVICAL STROMA IS NOT INVOLVED. - SEE ONCOLOGY TABLE. - SEE NOTE 2. Ovary and fallopian tube, right - PRIMARY OVARIAN ENDOMETRIOID ADENOCARCINOMA, FIGO GRADE II, 12 CM. - THE OVARIAN SURFACE IS FOCALLY INVOLVED BY CARCINOMA. - NEGATIVE FOR LYMPHOVASCULAR INVASION. - BENIGN UNREMARKABLE FALLOPIAN TUBE, NEGATIVE FOR CARCINOMA. - SEE ONCOLOGY TABLE. - SEE NOTE 3. Cul-de-sac biopsy - METASTATIC ADENOCARCINOMA, MOST CONSISTENT WITH PRIMARY OVARIAN ENDOMETRIOID ADENOCARCINOMA. 4. Ovary, left - BENIGN UNREMARKABLE OVARY, NEGATIVE FOR MALIGNANCY. Microscopic Comment 1. UTERUS, CARCINOMA OR CARCINOSARCOMA Procedure: Total hysterectomy with bilateral salpingo-oophorectomy. Histologic type: Endometrioid adenocarcinoma. Histologic Grade: FIGO Grade I Myometrial invasion: Depth of invasion: 2 mm Myometrial  thickness: 21 mm Uterine Serosa Involvement: Not identified Cervical stromal involvement: Not identified Extent of involvement of other organs: Not applicable Lymphovascular invasion: Not identified Regional Lymph Nodes: Examined: 0 Sentinel 0 Non-sentinel 0 Total Tumor block for ancillary studies: 1H MMR / MSI testing: Pending Pathologic Stage Classification (pTNM, AJCC 8th edition): pT1a, pNX (v4.1.0.0) 2. OVARY or FALLOPIAN TUBE or PRIMARY PERITONEUM: Procedure: Salpingo-oophorectomy Specimen Integrity: Intact Tumor Site: Right ovary Ovarian Surface Involvement (required only if applicable): Focally involved by carcinoma Fallopian Tube Surface Involvement (required only if applicable): Not identified Tumor Size: 12 cm Histologic Type: Endometrioid adenocarcinoma Histologic Grade: Grade II Implants (required for advanced stage serous/seromucinous borderline tumors only): Not applicable Other Tissue/ Organ Involvement: Cul de sac biopsy involved by tumor Largest Extrapelvic Peritoneal Focus (required only if applicable): Not applicable Peritoneal/Ascitic Fluid: Negative for carcinoma (case # [id number]) Treatment Effect (required only for high-grade serous carcinomas): Not applicable Regional Lymph Nodes: No lymph nodes submitted or found Number of Lymph Nodes Examined: 0 Pathologic Stage Classification (pTNM, AJCC 8th Edition): pT2b, pN0 Representative Tumor Block: 2B and 2E 1. Molecular study for microsatellite instability and immunohistochemical stains for MMR-related proteins are pending and will be reported in an addendum. 2. Immunohistochemical stain show that the ovarian tumor is positive for CK7 and PAX8 (both diffuse), CDX2 (patchy and weak); and negative for CK20. This immunoprofile is consistent with the above diagnosis. Dr. Lyndon Code has reviewed this case and concurs with the above diagnosis. Molecular study for microsatellite instability and immunohistochemical stains for  MMR-related proteins are pending and will be reported in an addendum    08/30/2017 Genetic Testing    Patient has genetic testing done for MSI  Results revealed patient has the following mutation(s): loss of [facility] 2    08/30/2017 Surgery  Surgeon: Mart Piggs, MD Pre-operative Diagnosis:  1. Adnexal mass 2. Abnormal uterine bleeding 3. H/o Cecal CA  Post-operative Diagnosis:  1. Adhesive disease post colon resection 2. Endometrial cancer NOS 3. Adenocarcinoma unknown origin, right ovary, suspicious for GI primary  Operation:  1. Lysis of adhesions ~30 minutes 2. Robotic-assisted laparoscopic total hysterectomy with right salpingo-oophorectomy and left salpingectomy 3. Left oophorectomy (RA-laparoscopic) 4. Pelvic washings  Findings: Adhesions of omentum to anterior abdominal wall. Enlarged cystic right ovary ~10cm. Uterus had small nodules on serosa near where right adnexa was intimate with the surface. No obvious intraoperative rupture of cyst, although in 2 areas the wall was thin and one of these areas had some bleeding. Slight scarring of left bladder dome to LUS/cervix. Uterus on frozen section c/w hyperplasia and a small focus of endometrial CA - no myo invasion, <2cm in size. Frozen section on the right adnexa was carcinoma, met from colon or possibly Gyn, favor GI primary, defer to permanent. Left ovary was WNL.      09/15/2017 Cancer Staging    Staging form: Corpus Uteri - Carcinoma and Carcinosarcoma, AJCC 8th Edition - Pathologic: FIGO Stage IA (pT1a, pN0, cM0) - Signed by Heath Lark, MD on 09/15/2017    09/26/2017 Procedure    Successful placement of a right internal jugular approach power injectable Port-A-Cath. The catheter is ready for immediate use.    11/07/2017 Imaging    1. Since 08/18/2017, similar to slight decrease in size of a pancreatic body/tail junction lesion. Cross modality comparison relative to 09/16/2017 MRI is also grossly similar. 2. No  evidence of metastatic disease. 3. Aortic Atherosclerosis (ICD10-I70.0).  4. Left nephrolithiasis.    11/18/2017 Genetic Testing    MSH2 c.2237dupT pathogenic mutation identified in the CancerNext panel.  The CancerNext gene panel offered by [employer] includes sequencing and rearrangement analysis for the following 34 genes:   APC, ATM, BARD1, BMPR1A, BRCA1, BRCA2, BRIP1, CDH1, CDK4, CDKN2A, CHEK2, DICER1, HOXB13, EPCAM, GREM1, MLH1, MRE11A, MSH2, MSH6, MUTYH, NBN, NF1, PALB2, PMS2, POLD1, POLE, PTEN, RAD50, RAD51C, RAD51D, SMAD4, SMARCA4, STK11, and TP53.  The report date is November 18, 2017.  MSH2 [id number] pathogenic mutation identified on somatic testing.  These results are consistent with a diagnosis of Lynch syndrome.     Secondary malignant neoplasm of right ovary ([place])   08/23/2017 Tumor Marker    Patient's tumor was tested for the following markers: CA-125 Results of the tumor marker test revealed 139.8    08/31/2017 Initial Diagnosis    Secondary malignant neoplasm of right ovary ([place])    09/15/2017 Cancer Staging    Staging form: Ovary, Fallopian Tube, and Primary Peritoneal Carcinoma, AJCC 8th Edition - Pathologic: Stage IIB (pT2b, pN0, cM0) - Signed by Heath Lark, MD on 09/15/2017    09/27/2017 Imaging    No evidence of metastatic disease or other acute findings within the thorax.  4 cm low-attenuation mass in pancreatic tail, highly suspicious for pancreatic carcinoma. This is caused splenic vein thrombosis, with new venous collaterals in the left upper quadrant. Consider endoscopic ultrasound with FNA for tissue diagnosis.  Stable benign hepatic hemangioma.     09/27/2017 Tumor Marker    Patient's tumor was tested for the following markers: CA-125 Results of the tumor marker test revealed 39.4    11/02/2017 Tumor Marker    Patient's tumor was tested for the following markers: CA-125 Results of the tumor marker test revealed 19    11/07/2017 Tumor  Marker  Patient's tumor was tested for the following markers: CA-125 Results of the tumor marker test revealed 18.3    11/18/2017 Genetic Testing    MSH2 c.2237dupT pathogenic mutation identified in the CancerNext panel.  The CancerNext gene panel offered by [employer] includes sequencing and rearrangement analysis for the following 34 genes:   APC, ATM, BARD1, BMPR1A, BRCA1, BRCA2, BRIP1, CDH1, CDK4, CDKN2A, CHEK2, DICER1, HOXB13, EPCAM, GREM1, MLH1, MRE11A, MSH2, MSH6, MUTYH, NBN, NF1, PALB2, PMS2, POLD1, POLE, PTEN, RAD50, RAD51C, RAD51D, SMAD4, SMARCA4, STK11, and TP53.  The report date is November 18, 2017.  MSH2 [id number] pathogenic mutation identified on somatic testing.  These results are consistent with a diagnosis of Lynch syndrome.    12/15/2017 Tumor Marker    Patient's tumor was tested for the following markers: CA-125 Results of the tumor marker test revealed 57.3    01/16/2018 Tumor Marker    Patient's tumor was tested for the following markers: CA-125 Results of the tumor marker test revealed 24.2     Pancreatic cancer ([place])   10/13/2017 Pathology Results    Pancreas tail mass, endoscopic ultrasound-guided, fine needle aspiration II (smears and cell block): Adenocarcinoma    11/18/2017 Genetic Testing    MSH2 c.2237dupT pathogenic mutation identified in the CancerNext panel.  The CancerNext gene panel offered by [employer] includes sequencing and rearrangement analysis for the following 34 genes:   APC, ATM, BARD1, BMPR1A, BRCA1, BRCA2, BRIP1, CDH1, CDK4, CDKN2A, CHEK2, DICER1, HOXB13, EPCAM, GREM1, MLH1, MRE11A, MSH2, MSH6, MUTYH, NBN, NF1, PALB2, PMS2, POLD1, POLE, PTEN, RAD50, RAD51C, RAD51D, SMAD4, SMARCA4, STK11, and TP53.  The report date is November 18, 2017.  MSH2 [id number] pathogenic mutation identified on somatic testing.  These results are consistent with a diagnosis of Lynch syndrome.    11/24/2017 Pathology Results     A. "TAIL OF PANCREAS AND SPLEEN", DISTAL PANCREATECTOMY AND SPLENECTOMY: Invasive ductal adenocarcinoma, moderately to poorly differentiated with focal signet ring cell features, of pancreas (distal).   The carcinoma is 2.5 cm in greatest dimension grossly.   Treatment effects present in the form of fibrosis (50%). No lymphovascular or definite perineural invasion identified. All surgical margins are negative for tumor or high-grade dysplasia. Adjacent mucinous neoplasm, most consistent with Intraductal papillary mucinous neoplasm (IPMN) with low-grade dysplasia.   Uninvolved pancreas show atrophy and focal acute inflammation.   Twelve benign lymph nodes (0/12). Spleen with no significant histopathologic abnormalities.  PROCEDURE: distal pancreatectomy and splenectomy TUMOR SITE: distal pancreas TUMOR SIZE:  GREATEST DIMENSION: 2.5 cm  ADDITIONAL DIMENSIONS:  x  cm HISTOLOGIC TYPE: ductal adenocarcinoma HISTOLOGIC GRADE: grade 3 TUMOR EXTENSION: peripancreatic soft tissue MARGINS: negative for tumor TREATMENT EFFECT: treatment effects present in the form of fibrosis (50%). LYMPHOVASCULAR INVASION: not identified PERINEURAL INVASION: no definite evidence REGIONAL LYMPH NODES:   NUMBER OF LYMPH NODES INVOLVED: 0   NUMBER OF LYMPH NODES EXAMINED: 12 PATHOLOGIC STAGE CLASSIFICATION (pTNM, AJCC 8th Ed): ypT2, ypN0 DISTANT METASTASIS (pM): pMx ADDITIONAL PATHOLOGIC FINDINGS: mucinous neoplasm, most consistent with intraductal papillary mucinous neoplasm (IPMN) with low-grade dysplasia, is identified adjacent to the main tumor.    11/24/2017 Cancer Staging    Staging form: Exocrine Pancreas, AJCC 8th Edition - Pathologic stage from 11/24/2017: Stage IB (pT2, pN0, cM0) - Signed by Truitt Merle, MD on 01/03/2018    11/25/2017 Surgery    She had surgery at [facility] 1. Exploratory Laparotomy 2. Distal Pancreatectomy and  Splenectomy 3. Intraoperative Ultrasound 4. Open Cholecystectomy     12/15/2017 Cancer Staging  Staging form: Exocrine Pancreas, AJCC 8th Edition - Clinical: Stage IB (cT2, cN0, cM0) - Signed by Heath Lark, MD on 12/15/2017    12/28/2017 Imaging    12/28/2017 CT Abdomen  IMPRESSION: 1. Postoperative findings from recent partial pancreatectomy including a 21 cubic cm fluid collection along the pancreatic resection margin which could represent early pseudocyst. 2. Nodularity along the lateral limb of the left adrenal gland could also be postoperative but merit surveillance, as the pancreatic lesion was in close proximity to this adrenal gland on the prior CT of 11/07/2017. 3. Asymmetric fullness inferiorly in the left breast. The patient has a history of prior left breast procedures, correlation with mammography is recommended. 4. Other imaging findings of potential clinical significance: Stable hemangioma in the left hepatic lobe. Splenectomy. Aortic Atherosclerosis (ICD10-I70.0). Right hemicolectomy. Small focus of fat necrosis in the right anterior abdominal wall subcutaneous tissues near the laparotomy site. Bilateral nonobstructive nephrolithiasis.    01/02/2018 Tumor Marker    Patient's tumor was tested for the following markers: CA-19-9 Results of the tumor marker test revealed 5    01/03/2018 -  Chemotherapy    She received modified dose FOLFIRINOX    CURRENT THERAPY: FOLFIRINOX status post 1 cycle.  INTERVAL HISTORY: TIRZA SENTENO 42 y.o. female returns for routine follow-up visit by herself.  The patient reports that she is feeling better today.  She received IV fluids earlier this week and her chemotherapy was held.  She reports that her abdominal discomfort has resolved.  Diarrhea has improved with use of Imodium.  She is no longer using Lomotil.  Having approximate 3 stools per day but this is still better than it was earlier this week.  Denies fevers and chills.  Denies  chest pain, shortness breath, cough, hemoptysis.  Denies nausea and vomiting.  The patient feels as though she is ready to proceed with chemotherapy.  She is here for evaluation prior to cycle #2.  MEDICAL HISTORY: Past Medical History:  Diagnosis Date  . Allergic rhinitis, seasonal   . Cecal cancer ([place]) 2001   Stage II (T3N0)  04-11-2001cecum-partial colectomy and completed chemo 2002  . Endometrial cancer ([place]) 08/2017   Stage IA, Grade 1  . History of MRSA infection 01/2017   followed by infectious disease center--  recurrent pustular folliculitis  . Nephrolithiasis    bilateral nonobstructive calculi per CT 08-18-2017  . OA (osteoarthritis)   . Ovarian cancer, right ([place]) 08/2017   Stage II Grade 2 Endometrioid  . Pancreatic cancer ([place])   . Rheumatoid arthritis [facility])    rheumatologist-  dr devenswar-- treated w/ oral prednisone daily and methotrexate injection every 3 wks    ALLERGIES:  is allergic to codeine; penicillins; and bactrim [sulfamethoxazole-trimethoprim].  MEDICATIONS:  Current Outpatient Medications  Medication Sig Dispense Refill  . acetaminophen (TYLENOL) 650 MG CR tablet Take 1,300 mg by mouth every 8 (eight) hours.    Marland Kitchen dicyclomine (BENTYL) 10 MG capsule Take 1 capsule (10 mg total) by mouth every 8 (eight) hours as needed for spasms. 20 capsule 0  . diphenoxylate-atropine (LOMOTIL) 2.5-0.025 MG tablet 1 to 2 po QID prn diarrhea 40 tablet 1  . HYDROmorphone (DILAUDID) 4 MG tablet Take 1 tablet (4 mg total) by mouth every 4 (four) hours as needed for severe pain. 30 tablet 0  . lidocaine-prilocaine (EMLA) cream Apply to affected area once (Patient taking differently: Apply 1 application topically daily as needed (port). Apply to affected area once) 30 g 3  .  loratadine (CLARITIN) 10 MG tablet Take 10 mg by mouth daily after breakfast.    . LORazepam (ATIVAN) 0.5 MG tablet Place 1 tablet (0.5 mg total) under the tongue every 6 (six) hours as needed for anxiety.  30 tablet 1  . meclizine (MEDI-MECLIZINE) 25 MG tablet Take 1 tablet (25 mg total) by mouth 3 (three) times daily as needed for dizziness. 30 tablet 1  . naproxen sodium (ALEVE) 220 MG tablet Take 220 mg by mouth.    Marland Kitchen omeprazole (PRILOSEC) 20 MG capsule Take 20 mg by mouth 2 (two) times daily before a meal.     . ondansetron (ZOFRAN) 8 MG tablet Take 1 tablet (8 mg total) by mouth every 8 (eight) hours as needed for refractory nausea / vomiting. Start on day 3 after chemo. 30 tablet 1  . predniSONE (DELTASONE) 10 MG tablet TAKE 1 TABLET BY MOUTH EVERY DAY WITH BREAKFAST (Patient taking differently: Take 5 mg by mouth daily with breakfast. ) 30 tablet 0  . predniSONE (DELTASONE) 5 MG tablet Take 1 tablet (5 mg total) by mouth daily with breakfast. 30 tablet 1  . prochlorperazine (COMPAZINE) 10 MG tablet Take 1 tablet (10 mg total) by mouth every 6 (six) hours as needed (Nausea or vomiting). 30 tablet 1  . simethicone (MYLICON) 277 MG chewable tablet Chew 125 mg by mouth every 6 (six) hours as needed for flatulence.    . traMADol (ULTRAM) 50 MG tablet Take 1-2 tablets (50-100 mg total) by mouth every 6 (six) hours as needed. 90 tablet 0   No current facility-administered medications for this visit.     SURGICAL HISTORY:  Past Surgical History:  Procedure Laterality Date  . BREAST LUMPECTOMY WITH RADIOACTIVE SEED LOCALIZATION Left 06/28/2014   Benign Procedure: RADIOACTIVE SEED LOCALIZATION LEFT BREAST LUMPECTOMY;  Surgeon: Excell Seltzer, MD;  Location: [facility];  Service: General;  Laterality: Left;  . COLONOSCOPY  06/19/14  . EYE SURGERY Left    plug in tear duct  . IR IMAGING GUIDED PORT INSERTION  09/26/2017  . KNEE ARTHROSCOPY    . PANCREATECTOMY  11/25/2017  . PORT A CATH REVISION  2001   in and out  . RIGHT COLECTOMY  06/02/2009   cecum cancer  . ROBOTIC ASSISTED TOTAL HYSTERECTOMY WITH BILATERAL SALPINGO OOPHERECTOMY Bilateral 08/30/2017   Procedure: XI ROBOTIC  ASSISTED TOTAL  HYSTERECTOMY BILATERAL  SALPINGO OOPHORECTOMY; LYSIS OF ADHESIONS;  Surgeon: Isabel Caprice, MD;  Location: [facility];  Service: Gynecology;  Laterality: Bilateral;  . SPLENECTOMY, PARTIAL  11/25/2017    REVIEW OF SYSTEMS:   Review of Systems  Constitutional: Negative for appetite change, chills, fatigue, fever and unexpected weight change.  HENT:   Negative for mouth sores, nosebleeds, sore throat and trouble swallowing.   Eyes: Negative for eye problems and icterus.  Respiratory: Negative for cough, hemoptysis, shortness of breath and wheezing.   Cardiovascular: Negative for chest pain and leg swelling.  Gastrointestinal: Negative for abdominal pain, constipation, nausea and vomiting.  Positive for diarrhea which is improving. Genitourinary: Negative for bladder incontinence, difficulty urinating, dysuria, frequency and hematuria.   Musculoskeletal: Negative for back pain, gait problem, neck pain and neck stiffness.  Skin: Negative for itching and rash.  Neurological: Negative for dizziness, extremity weakness, gait problem, headaches, light-headedness and seizures.  Hematological: Negative for adenopathy. Does not bruise/bleed easily.  Psychiatric/Behavioral: Negative for confusion, depression and sleep disturbance. The patient is not nervous/anxious.     PHYSICAL EXAMINATION:  Blood pressure Marland Kitchen)  144/95, pulse 100, temperature 97.9 F (36.6 C), temperature source Oral, resp. rate 18, height '5\' 2"'  (1.575 m), weight 211 lb 12.8 oz (96.1 kg), last menstrual period 08/30/2017, SpO2 100 %.  ECOG PERFORMANCE STATUS: 1 - Symptomatic but completely ambulatory  Physical Exam  Constitutional: Oriented to person, place, and time and well-developed, well-nourished, and in no distress. No distress.  HENT:  Head: Normocephalic and atraumatic.  Mouth/Throat: Oropharynx is clear and moist. No oropharyngeal exudate.  Eyes: Conjunctivae are normal. Right eye exhibits no discharge. Left  eye exhibits no discharge. No scleral icterus.  Neck: Normal range of motion. Neck supple.  Cardiovascular: Normal rate, regular rhythm, normal heart sounds and intact distal pulses.   Pulmonary/Chest: Effort normal and breath sounds normal. No respiratory distress. No wheezes. No rales.  Abdominal: Soft. Bowel sounds are normal. Exhibits no distension and no mass. There is no tenderness.  Musculoskeletal: Normal range of motion. Exhibits no edema.  Lymphadenopathy:    No cervical adenopathy.  Neurological: Alert and oriented to person, place, and time. Exhibits normal muscle tone. Gait normal. Coordination normal.  Skin: Skin is warm and dry. No rash noted. Not diaphoretic. No erythema. No pallor.  Psychiatric: Mood, memory and judgment normal.  Vitals reviewed.  LABORATORY DATA: Lab Results  Component Value Date   WBC 16.9 (H) 01/20/2018   HGB 10.2 (L) 01/20/2018   HCT 34.1 (L) 01/20/2018   MCV 90.5 01/20/2018   PLT 599 (H) 01/20/2018      Chemistry      Component Value Date/Time   NA 142 01/20/2018 0901   K 3.4 (L) 01/20/2018 0901   CL 106 01/20/2018 0901   CO2 23 01/20/2018 0901   BUN 9 01/20/2018 0901   CREATININE 0.65 01/20/2018 0901   CREATININE 0.77 01/11/2018 1115   CREATININE 0.68 07/27/2017 0851      Component Value Date/Time   CALCIUM 8.5 (L) 01/20/2018 0901   ALKPHOS 62 01/20/2018 0901   AST 7 (L) 01/20/2018 0901   AST 30 01/11/2018 1115   ALT 20 01/20/2018 0901   ALT 82 (H) 01/11/2018 1115   BILITOT <0.2 (L) 01/20/2018 0901   BILITOT 0.3 01/11/2018 1115       RADIOGRAPHIC STUDIES:  Ct Abdomen Pelvis W Contrast  Result Date: 12/28/2017 CLINICAL DATA:  History of colon cancer, ovarian cancer, uterine cancer, and pancreatic cancer. Lynch syndrome. Prior partial pancreatectomy and splenectomy. Restaging assessment. EXAM: CT ABDOMEN AND PELVIS WITH CONTRAST TECHNIQUE: Multidetector CT imaging of the abdomen and pelvis was performed using the standard  protocol following bolus administration of intravenous contrast. CONTRAST:  148m OMNIPAQUE IOHEXOL 300 MG/ML  SOLN COMPARISON:  11/07/2017 FINDINGS: Lower chest: Asymmetric fullness of left inferior breast tissues, image 9/2. Mild scarring or atelectasis in the right middle lobe and anteriorly in the right lower lobe. Hepatobiliary: Stable hemangioma in segment IVa of the liver. Cholecystectomy. Pancreas: Partial pancreatectomy. A cystic lesion measuring approximately 4.7 by 2.8 by 3.1 cm (volume = 21 cm^3)tracks along and below the pancreatic resection margin. Spleen: Splenectomy Adrenals/Urinary Tract: Nodularity along or within the lateral limb of the left adrenal gland measuring 1.0 by 1.4 cm on image 43/7. 3 mm left kidney lower pole nonobstructive renal calculus, image 52/3. 2 mm right kidney lower pole nonobstructive renal calculus, image 51/3. Urinary bladder unremarkable. Stomach/Bowel: Right hemicolectomy. Vascular/Lymphatic: Aortoiliac atherosclerotic vascular disease. Reproductive: Uterus absent. Adnexa unremarkable. Ovaries not well seen. Other: Increased stranding in the omentum likely from recent surgery. Laparotomy site noted. Small  focus of suspected subcutaneous fat necrosis along the right side of the laparotomy site, this resembles herniated adipose tissue but I do not see a definite hernia neck. Musculoskeletal: Unremarkable IMPRESSION: 1. Postoperative findings from recent partial pancreatectomy including a 21 cubic cm fluid collection along the pancreatic resection margin which could represent early pseudocyst. 2. Nodularity along the lateral limb of the left adrenal gland could also be postoperative but merit surveillance, as the pancreatic lesion was in close proximity to this adrenal gland on the prior CT of 11/07/2017. 3. Asymmetric fullness inferiorly in the left breast. The patient has a history of prior left breast procedures, correlation with mammography is recommended. 4. Other  imaging findings of potential clinical significance: Stable hemangioma in the left hepatic lobe. Splenectomy. Aortic Atherosclerosis (ICD10-I70.0). Right hemicolectomy. Small focus of fat necrosis in the right anterior abdominal wall subcutaneous tissues near the laparotomy site. Bilateral nonobstructive nephrolithiasis. Electronically Signed   By: Van Clines M.D.   On: 12/28/2017 09:38     ASSESSMENT/PLAN:  Pancreatic cancer [facility]) This is a pleasant 42 year old female with pancreatic cancer.  Currently receiving adjuvant chemotherapy.  She is status post 1 cycle of FOLFIRINOX.  She had difficulty with nausea, vomiting, and diarrhea following her first cycle.  She required IV hydration. The start of cycle 2 has been delayed due to side effects.  He is feeling much better today.  Diarrhea has improved.  She would like to proceed with chemotherapy tomorrow as scheduled.  Labs from today have been reviewed and are adequate for treatment.  Irinotecan dose will be reduced with cycle #2.  She has mild hypokalemia and we discussed increasing the potassium in her diet.  We reviewed how to take her Imodium and Lomotil.  She has antiemetics available to her.  The patient will follow-up next week for evaluation and repeat lab work.  She was advised to call us if she has any issues prior to that and we can see her in the symptom management clinic.  Iron deficiency anemia Anemia is likely multifactorial including recent surgery, chronic illness, and chemotherapy.  Continue multivitamin.  We will continue to monitor her hemoglobin and consider transfusion if she becomes more anemic.  S/P splenectomy She has received vaccination postsplenectomy.  Her platelet count is likely reflective of postoperative changes.  White blood cell count is also elevated likely due to splenectomy and recent G-CSF support.   No orders of the defined types were placed in this encounter.    Mikey Bussing, DNP, AGPCNP-BC,  AOCNP 01/20/18

## 2018-01-20 NOTE — Assessment & Plan Note (Signed)
Anemia is likely multifactorial including recent surgery, chronic illness, and chemotherapy.  Continue multivitamin.  We will continue to monitor her hemoglobin and consider transfusion if she becomes more anemic.

## 2018-01-21 ENCOUNTER — Inpatient Hospital Stay: Payer: BLUE CROSS/BLUE SHIELD

## 2018-01-21 VITALS — BP 169/80 | HR 75 | Temp 98.9°F | Resp 16

## 2018-01-21 DIAGNOSIS — C7961 Secondary malignant neoplasm of right ovary: Secondary | ICD-10-CM | POA: Diagnosis not present

## 2018-01-21 DIAGNOSIS — C252 Malignant neoplasm of tail of pancreas: Secondary | ICD-10-CM | POA: Diagnosis not present

## 2018-01-21 DIAGNOSIS — E860 Dehydration: Secondary | ICD-10-CM | POA: Diagnosis not present

## 2018-01-21 DIAGNOSIS — C541 Malignant neoplasm of endometrium: Secondary | ICD-10-CM

## 2018-01-21 DIAGNOSIS — Z5111 Encounter for antineoplastic chemotherapy: Secondary | ICD-10-CM | POA: Diagnosis not present

## 2018-01-21 DIAGNOSIS — M069 Rheumatoid arthritis, unspecified: Secondary | ICD-10-CM | POA: Diagnosis not present

## 2018-01-21 DIAGNOSIS — Z90722 Acquired absence of ovaries, bilateral: Secondary | ICD-10-CM | POA: Diagnosis not present

## 2018-01-21 DIAGNOSIS — E876 Hypokalemia: Secondary | ICD-10-CM | POA: Diagnosis not present

## 2018-01-21 DIAGNOSIS — Z79899 Other long term (current) drug therapy: Secondary | ICD-10-CM | POA: Diagnosis not present

## 2018-01-21 DIAGNOSIS — D509 Iron deficiency anemia, unspecified: Secondary | ICD-10-CM | POA: Diagnosis not present

## 2018-01-21 DIAGNOSIS — E669 Obesity, unspecified: Secondary | ICD-10-CM | POA: Diagnosis not present

## 2018-01-21 DIAGNOSIS — I700 Atherosclerosis of aorta: Secondary | ICD-10-CM | POA: Diagnosis not present

## 2018-01-21 DIAGNOSIS — R52 Pain, unspecified: Secondary | ICD-10-CM | POA: Diagnosis not present

## 2018-01-21 DIAGNOSIS — Z90411 Acquired partial absence of pancreas: Secondary | ICD-10-CM | POA: Diagnosis not present

## 2018-01-21 DIAGNOSIS — Z90710 Acquired absence of both cervix and uterus: Secondary | ICD-10-CM | POA: Diagnosis not present

## 2018-01-21 DIAGNOSIS — D638 Anemia in other chronic diseases classified elsewhere: Secondary | ICD-10-CM | POA: Diagnosis not present

## 2018-01-21 MED ORDER — HEPARIN SOD (PORK) LOCK FLUSH 100 UNIT/ML IV SOLN
500.0000 [IU] | Freq: Once | INTRAVENOUS | Status: DC | PRN
  Filled 2018-01-21: qty 5

## 2018-01-21 MED ORDER — ATROPINE SULFATE 1 MG/ML IJ SOLN
0.5000 mg | Freq: Once | INTRAMUSCULAR | Status: AC | PRN
  Administered 2018-01-21: 0.5 mg via INTRAVENOUS

## 2018-01-21 MED ORDER — OXALIPLATIN CHEMO INJECTION 100 MG/20ML
85.0000 mg/m2 | Freq: Once | INTRAVENOUS | Status: AC
  Administered 2018-01-21: 175 mg via INTRAVENOUS
  Filled 2018-01-21: qty 35

## 2018-01-21 MED ORDER — LEUCOVORIN CALCIUM INJECTION 350 MG
400.0000 mg/m2 | Freq: Once | INTRAVENOUS | Status: AC
  Administered 2018-01-21: 828 mg via INTRAVENOUS
  Filled 2018-01-21: qty 41.4

## 2018-01-21 MED ORDER — SODIUM CHLORIDE 0.9 % IV SOLN
5000.0000 mg | INTRAVENOUS | Status: DC
  Administered 2018-01-21: 5000 mg via INTRAVENOUS
  Filled 2018-01-21: qty 100

## 2018-01-21 MED ORDER — SODIUM CHLORIDE 0.9 % IV SOLN
Freq: Once | INTRAVENOUS | Status: AC
  Administered 2018-01-21: 09:00:00 via INTRAVENOUS
  Filled 2018-01-21: qty 5

## 2018-01-21 MED ORDER — DEXTROSE 5 % IV SOLN
INTRAVENOUS | Status: DC
  Administered 2018-01-21: 09:00:00 via INTRAVENOUS
  Filled 2018-01-21: qty 250

## 2018-01-21 MED ORDER — ATROPINE SULFATE 1 MG/ML IJ SOLN
INTRAMUSCULAR | Status: AC
  Filled 2018-01-21: qty 1

## 2018-01-21 MED ORDER — DEXTROSE 5 % IV SOLN
Freq: Once | INTRAVENOUS | Status: AC
  Administered 2018-01-21: 08:00:00 via INTRAVENOUS
  Filled 2018-01-21: qty 250

## 2018-01-21 MED ORDER — SODIUM CHLORIDE 0.9% FLUSH
10.0000 mL | INTRAVENOUS | Status: DC | PRN
  Filled 2018-01-21: qty 10

## 2018-01-21 MED ORDER — PALONOSETRON HCL INJECTION 0.25 MG/5ML
0.2500 mg | Freq: Once | INTRAVENOUS | Status: AC
  Administered 2018-01-21: 0.25 mg via INTRAVENOUS

## 2018-01-21 MED ORDER — PALONOSETRON HCL INJECTION 0.25 MG/5ML
INTRAVENOUS | Status: AC
  Filled 2018-01-21: qty 5

## 2018-01-21 MED ORDER — IRINOTECAN HCL CHEMO INJECTION 100 MG/5ML
130.0000 mg/m2 | Freq: Once | INTRAVENOUS | Status: AC
  Administered 2018-01-21: 260 mg via INTRAVENOUS
  Filled 2018-01-21: qty 13

## 2018-01-21 NOTE — Progress Notes (Signed)
At 1300 Irinotecan paused for complaint of eye twitching and some slurred speech. Dr. Julien Nordmann called and reported above, instructed to hold Irinotecan for 15 minutes and resume, Normal Saline started via gravity.  Resumed at 1320 after all symptoms resolved, Normal saline stopped.

## 2018-01-21 NOTE — Patient Instructions (Signed)
[facility] Discharge Instructions for Patients Receiving Chemotherapy  Today you received the following chemotherapy agents: Oxaliplatin, Irinotecan, Leucovorin and 5FU.  To help prevent nausea and vomiting after your treatment, we encourage you to take your nausea medication.     If you develop nausea and vomiting that is not controlled by your nausea medication, call the clinic.   BELOW ARE SYMPTOMS THAT SHOULD BE REPORTED IMMEDIATELY:  *FEVER GREATER THAN 100.5 F  *CHILLS WITH OR WITHOUT FEVER  NAUSEA AND VOMITING THAT IS NOT CONTROLLED WITH YOUR NAUSEA MEDICATION  *UNUSUAL SHORTNESS OF BREATH  *UNUSUAL BRUISING OR BLEEDING  TENDERNESS IN MOUTH AND THROAT WITH OR WITHOUT PRESENCE OF ULCERS  *URINARY PROBLEMS  *BOWEL PROBLEMS  UNUSUAL RASH Items with * indicate a potential emergency and should be followed up as soon as possible.  Feel free to call the clinic should you have any questions or concerns. The clinic phone number is [phone].  Please show the [facility] at check-in to the Emergency Department and triage nurse.

## 2018-01-23 ENCOUNTER — Other Ambulatory Visit: Payer: Self-pay | Admitting: Nurse Practitioner

## 2018-01-23 ENCOUNTER — Inpatient Hospital Stay: Payer: BLUE CROSS/BLUE SHIELD | Attending: Hematology and Oncology

## 2018-01-23 ENCOUNTER — Telehealth: Payer: Self-pay

## 2018-01-23 DIAGNOSIS — C541 Malignant neoplasm of endometrium: Secondary | ICD-10-CM | POA: Diagnosis not present

## 2018-01-23 DIAGNOSIS — M170 Bilateral primary osteoarthritis of knee: Secondary | ICD-10-CM | POA: Insufficient documentation

## 2018-01-23 DIAGNOSIS — D509 Iron deficiency anemia, unspecified: Secondary | ICD-10-CM | POA: Diagnosis not present

## 2018-01-23 DIAGNOSIS — Z5111 Encounter for antineoplastic chemotherapy: Secondary | ICD-10-CM | POA: Diagnosis not present

## 2018-01-23 DIAGNOSIS — C252 Malignant neoplasm of tail of pancreas: Secondary | ICD-10-CM | POA: Insufficient documentation

## 2018-01-23 DIAGNOSIS — M069 Rheumatoid arthritis, unspecified: Secondary | ICD-10-CM | POA: Diagnosis not present

## 2018-01-23 DIAGNOSIS — R748 Abnormal levels of other serum enzymes: Secondary | ICD-10-CM | POA: Insufficient documentation

## 2018-01-23 DIAGNOSIS — Z1509 Genetic susceptibility to other malignant neoplasm: Secondary | ICD-10-CM | POA: Insufficient documentation

## 2018-01-23 DIAGNOSIS — J00 Acute nasopharyngitis [common cold]: Secondary | ICD-10-CM | POA: Diagnosis not present

## 2018-01-23 DIAGNOSIS — Z79899 Other long term (current) drug therapy: Secondary | ICD-10-CM | POA: Diagnosis not present

## 2018-01-23 DIAGNOSIS — R079 Chest pain, unspecified: Secondary | ICD-10-CM | POA: Insufficient documentation

## 2018-01-23 DIAGNOSIS — Z5189 Encounter for other specified aftercare: Secondary | ICD-10-CM | POA: Diagnosis not present

## 2018-01-23 DIAGNOSIS — G620 Drug-induced polyneuropathy: Secondary | ICD-10-CM | POA: Insufficient documentation

## 2018-01-23 DIAGNOSIS — E860 Dehydration: Secondary | ICD-10-CM | POA: Insufficient documentation

## 2018-01-23 DIAGNOSIS — I700 Atherosclerosis of aorta: Secondary | ICD-10-CM | POA: Diagnosis not present

## 2018-01-23 DIAGNOSIS — R197 Diarrhea, unspecified: Secondary | ICD-10-CM

## 2018-01-23 DIAGNOSIS — R000 Tachycardia, unspecified: Secondary | ICD-10-CM | POA: Insufficient documentation

## 2018-01-23 DIAGNOSIS — R300 Dysuria: Secondary | ICD-10-CM | POA: Diagnosis not present

## 2018-01-23 DIAGNOSIS — C7961 Secondary malignant neoplasm of right ovary: Secondary | ICD-10-CM | POA: Diagnosis not present

## 2018-01-23 MED ORDER — PEGFILGRASTIM INJECTION 6 MG/0.6ML ~~LOC~~
PREFILLED_SYRINGE | SUBCUTANEOUS | Status: AC
  Filled 2018-01-23: qty 0.6

## 2018-01-23 MED ORDER — PEGFILGRASTIM INJECTION 6 MG/0.6ML ~~LOC~~
6.0000 mg | PREFILLED_SYRINGE | Freq: Once | SUBCUTANEOUS | Status: AC
  Administered 2018-01-23: 6 mg via SUBCUTANEOUS

## 2018-01-23 MED ORDER — DIPHENOXYLATE-ATROPINE 2.5-0.025 MG PO TABS: 1.0000 | ORAL_TABLET | Freq: Four times a day (QID) | ORAL | 1 refills | Status: DC | PRN

## 2018-01-23 MED ORDER — SODIUM CHLORIDE 0.9% FLUSH
10.0000 mL | Freq: Once | INTRAVENOUS | Status: AC
  Administered 2018-01-23: 10 mL
  Filled 2018-01-23: qty 10

## 2018-01-23 MED ORDER — HEPARIN SOD (PORK) LOCK FLUSH 100 UNIT/ML IV SOLN
250.0000 [IU] | Freq: Once | INTRAVENOUS | Status: DC
  Filled 2018-01-23: qty 5

## 2018-01-23 MED ORDER — SODIUM CHLORIDE 0.9% FLUSH
10.0000 mL | INTRAVENOUS | Status: DC | PRN
  Filled 2018-01-23: qty 10

## 2018-01-23 MED ORDER — HEPARIN SOD (PORK) LOCK FLUSH 100 UNIT/ML IV SOLN
500.0000 [IU] | Freq: Once | INTRAVENOUS | Status: AC | PRN
  Administered 2018-01-23: 500 [IU]
  Filled 2018-01-23: qty 5

## 2018-01-23 NOTE — Telephone Encounter (Signed)
Patient calls needing refill on Lomotil.

## 2018-01-23 NOTE — Patient Instructions (Signed)
Pegfilgrastim injection What is this medicine? PEGFILGRASTIM (PEG fil gra stim) is a long-acting granulocyte colony-stimulating factor that stimulates the growth of neutrophils, a type of white blood cell important in the body's fight against infection. It is used to reduce the incidence of fever and infection in patients with certain types of cancer who are receiving chemotherapy that affects the bone marrow, and to increase survival after being exposed to high doses of radiation. This medicine may be used for other purposes; ask your health care provider or pharmacist if you have questions. COMMON BRAND NAME(S): Neulasta What should I tell my health care provider before I take this medicine? They need to know if you have any of these conditions: -kidney disease -latex allergy -ongoing radiation therapy -sickle cell disease -skin reactions to acrylic adhesives (On-Body Injector only) -an unusual or allergic reaction to pegfilgrastim, filgrastim, other medicines, foods, dyes, or preservatives -pregnant or trying to get pregnant -breast-feeding How should I use this medicine? This medicine is for injection under the skin. If you get this medicine at home, you will be taught how to prepare and give the pre-filled syringe or how to use the On-body Injector. Refer to the patient Instructions for Use for detailed instructions. Use exactly as directed. Tell your healthcare provider immediately if you suspect that the On-body Injector may not have performed as intended or if you suspect the use of the On-body Injector resulted in a missed or partial dose. It is important that you put your used needles and syringes in a special sharps container. Do not put them in a trash can. If you do not have a sharps container, call your pharmacist or healthcare provider to get one. Talk to your pediatrician regarding the use of this medicine in children. While this drug may be prescribed for selected conditions,  precautions do apply. Overdosage: If you think you have taken too much of this medicine contact a poison control center or emergency room at once. NOTE: This medicine is only for you. Do not share this medicine with others. What if I miss a dose? It is important not to miss your dose. Call your doctor or health care professional if you miss your dose. If you miss a dose due to an On-body Injector failure or leakage, a new dose should be administered as soon as possible using a single prefilled syringe for manual use. What may interact with this medicine? Interactions have not been studied. Give your health care provider a list of all the medicines, herbs, non-prescription drugs, or dietary supplements you use. Also tell them if you smoke, drink alcohol, or use illegal drugs. Some items may interact with your medicine. This list may not describe all possible interactions. Give your health care provider a list of all the medicines, herbs, non-prescription drugs, or dietary supplements you use. Also tell them if you smoke, drink alcohol, or use illegal drugs. Some items may interact with your medicine. What should I watch for while using this medicine? You may need blood work done while you are taking this medicine. If you are going to need a MRI, CT scan, or other procedure, tell your doctor that you are using this medicine (On-Body Injector only). What side effects may I notice from receiving this medicine? Side effects that you should report to your doctor or health care professional as soon as possible: -allergic reactions like skin rash, itching or hives, swelling of the face, lips, or tongue -dizziness -fever -pain, redness, or irritation at site   where injected -pinpoint red spots on the skin -red or dark-brown urine -shortness of breath or breathing problems -stomach or side pain, or pain at the shoulder -swelling -tiredness -trouble passing urine or change in the amount of urine Side  effects that usually do not require medical attention (report to your doctor or health care professional if they continue or are bothersome): -bone pain -muscle pain This list may not describe all possible side effects. Call your doctor for medical advice about side effects. You may report side effects to FDA at 1-800-FDA-1088. Where should I keep my medicine? Keep out of the reach of children. Store pre-filled syringes in a refrigerator between 2 and 8 degrees C (36 and 46 degrees F). Do not freeze. Keep in carton to protect from light. Throw away this medicine if it is left out of the refrigerator for more than 48 hours. Throw away any unused medicine after the expiration date. NOTE: This sheet is a summary. It may not cover all possible information. If you have questions about this medicine, talk to your doctor, pharmacist, or health care provider.  2018 [employer] Standard (2016-02-05 12:58:03)  

## 2018-01-23 NOTE — Telephone Encounter (Signed)
I refilled to [facility].  Thanks, Regan Rakers

## 2018-01-25 ENCOUNTER — Telehealth: Payer: Self-pay

## 2018-01-25 ENCOUNTER — Encounter: Payer: Self-pay | Admitting: Nurse Practitioner

## 2018-01-25 ENCOUNTER — Other Ambulatory Visit: Payer: Self-pay

## 2018-01-25 ENCOUNTER — Inpatient Hospital Stay: Payer: BLUE CROSS/BLUE SHIELD

## 2018-01-25 ENCOUNTER — Inpatient Hospital Stay (HOSPITAL_BASED_OUTPATIENT_CLINIC_OR_DEPARTMENT_OTHER): Payer: BLUE CROSS/BLUE SHIELD | Admitting: Nurse Practitioner

## 2018-01-25 VITALS — BP 120/80 | HR 107 | Temp 98.8°F | Resp 18 | Ht 62.0 in | Wt 207.6 lb

## 2018-01-25 DIAGNOSIS — R748 Abnormal levels of other serum enzymes: Secondary | ICD-10-CM | POA: Diagnosis not present

## 2018-01-25 DIAGNOSIS — Z5189 Encounter for other specified aftercare: Secondary | ICD-10-CM | POA: Diagnosis not present

## 2018-01-25 DIAGNOSIS — C7961 Secondary malignant neoplasm of right ovary: Secondary | ICD-10-CM | POA: Diagnosis not present

## 2018-01-25 DIAGNOSIS — C541 Malignant neoplasm of endometrium: Secondary | ICD-10-CM

## 2018-01-25 DIAGNOSIS — R079 Chest pain, unspecified: Secondary | ICD-10-CM | POA: Diagnosis not present

## 2018-01-25 DIAGNOSIS — G620 Drug-induced polyneuropathy: Secondary | ICD-10-CM

## 2018-01-25 DIAGNOSIS — Z1509 Genetic susceptibility to other malignant neoplasm: Secondary | ICD-10-CM

## 2018-01-25 DIAGNOSIS — M069 Rheumatoid arthritis, unspecified: Secondary | ICD-10-CM | POA: Diagnosis not present

## 2018-01-25 DIAGNOSIS — D509 Iron deficiency anemia, unspecified: Secondary | ICD-10-CM

## 2018-01-25 DIAGNOSIS — C561 Malignant neoplasm of right ovary: Secondary | ICD-10-CM

## 2018-01-25 DIAGNOSIS — J00 Acute nasopharyngitis [common cold]: Secondary | ICD-10-CM | POA: Diagnosis not present

## 2018-01-25 DIAGNOSIS — E860 Dehydration: Secondary | ICD-10-CM

## 2018-01-25 DIAGNOSIS — Z5111 Encounter for antineoplastic chemotherapy: Secondary | ICD-10-CM | POA: Diagnosis not present

## 2018-01-25 DIAGNOSIS — I700 Atherosclerosis of aorta: Secondary | ICD-10-CM

## 2018-01-25 DIAGNOSIS — M170 Bilateral primary osteoarthritis of knee: Secondary | ICD-10-CM | POA: Diagnosis not present

## 2018-01-25 DIAGNOSIS — C252 Malignant neoplasm of tail of pancreas: Secondary | ICD-10-CM

## 2018-01-25 DIAGNOSIS — R300 Dysuria: Secondary | ICD-10-CM | POA: Diagnosis not present

## 2018-01-25 LAB — COMPREHENSIVE METABOLIC PANEL
ALT: 49 U/L — ABNORMAL HIGH (ref 0–44)
AST: 17 U/L (ref 15–41)
Albumin: 3.1 g/dL — ABNORMAL LOW (ref 3.5–5.0)
Alkaline Phosphatase: 87 U/L (ref 38–126)
Anion gap: 11 (ref 5–15)
BUN: 16 mg/dL (ref 6–20)
CO2: 26 mmol/L (ref 22–32)
Calcium: 8.3 mg/dL — ABNORMAL LOW (ref 8.9–10.3)
Chloride: 101 mmol/L (ref 98–111)
Creatinine, Ser: 0.74 mg/dL (ref 0.44–1.00)
GFR calc Af Amer: >60 mL/min (ref >60)
GFR calc non Af Amer: >60 mL/min (ref >60)
Glucose, Bld: 171 mg/dL — ABNORMAL HIGH (ref 70–99)
Potassium: 3.7 mmol/L (ref 3.5–5.1)
Sodium: 138 mmol/L (ref 135–145)
Total Bilirubin: 0.3 mg/dL (ref 0.3–1.2)
Total Protein: 6.1 g/dL — ABNORMAL LOW (ref 6.5–8.1)

## 2018-01-25 LAB — CBC WITH DIFFERENTIAL/PLATELET
Abs Immature Granulocytes: 0 10*3/uL (ref 0.00–0.07)
Band Neutrophils: 10 %
Basophils Absolute: 0 10*3/uL (ref 0.0–0.1)
Basophils Relative: 0 %
Eosinophils Absolute: 0 10*3/uL (ref 0.0–0.5)
Eosinophils Relative: 0 %
HCT: 33.9 % — ABNORMAL LOW (ref 36.0–46.0)
Hemoglobin: 10.2 g/dL — ABNORMAL LOW (ref 12.0–15.0)
Lymphocytes Relative: 1 %
Lymphs Abs: 0.8 10*3/uL (ref 0.7–4.0)
MCH: 27.2 pg (ref 26.0–34.0)
MCHC: 30.1 g/dL (ref 30.0–36.0)
MCV: 90.4 fL (ref 80.0–100.0)
Monocytes Absolute: 0 10*3/uL — ABNORMAL LOW (ref 0.1–1.0)
Monocytes Relative: 0 %
Neutro Abs: 74.8 10*3/uL — ABNORMAL HIGH (ref 1.7–17.7)
Neutrophils Relative %: 89 %
Platelets: 536 10*3/uL — ABNORMAL HIGH (ref 150–400)
RBC: 3.75 MIL/uL — ABNORMAL LOW (ref 3.87–5.11)
RDW: 18.5 % — ABNORMAL HIGH (ref 11.5–15.5)
WBC: 75.6 10*3/uL (ref 4.0–10.5)
nRBC: 0 % (ref 0.0–0.2)

## 2018-01-25 LAB — MAGNESIUM: Magnesium: 2 mg/dL (ref 1.7–2.4)

## 2018-01-25 MED ORDER — SODIUM CHLORIDE 0.9% FLUSH
10.0000 mL | Freq: Once | INTRAVENOUS | Status: AC
  Administered 2018-01-25: 10 mL
  Filled 2018-01-25: qty 10

## 2018-01-25 MED ORDER — HYDROXYZINE HCL 10 MG PO TABS: 10.0000 mg | ORAL_TABLET | Freq: Three times a day (TID) | ORAL | 0 refills | Status: DC | PRN

## 2018-01-25 MED ORDER — HEPARIN SOD (PORK) LOCK FLUSH 100 UNIT/ML IV SOLN
500.0000 [IU] | Freq: Once | INTRAVENOUS | Status: AC
  Administered 2018-01-25: 500 [IU]
  Filled 2018-01-25: qty 5

## 2018-01-25 NOTE — Telephone Encounter (Signed)
Pt here for labs, per Maudie Mercury in lab, does Dr Gerarda Fraction want the CA-125 drawn with her other labs today?  Yes, ok per Joylene John NP.

## 2018-01-25 NOTE — Progress Notes (Signed)
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 Mixed attenuation mass emanates from the right adnexa measuring 10.8 x 8.0 cm very suspicious for right ovarian carcinoma. 2. Abnormality of the tail of the pancreas may be due to mild pancreatitis and small pseudocyst formation, but neoplasm cannot be excluded. 3. Small amount of ascites within abdomen and pelvis. 4. Small nonobstructing renal calculi bilaterally.     08/30/2017 Pathology Results    1. Uterus and cervix, with left fallopian tube - ENDOMETRIOID ADENOCARCINOMA, FIGO GRADE I, ARISING IN A BACKGROUND OF DIFFUSE COMPLEX ATYPICAL HYPERPLASIA. - CARCINOMA INVADES FOR OF DEPTH OF 0.2 CM WHERE THICKNESS OF MYOMETRIAL WALL IS 2.1 CM. - ALL RESECTION MARGINS ARE NEGATIVE FOR CARCINOMA. - NEGATIVE FOR LYMPHOVASCULAR OR PERINEURAL INVASION. - CERVICAL STROMA IS NOT INVOLVED. - SEE ONCOLOGY TABLE. - SEE NOTE 2. Ovary and fallopian tube, right - PRIMARY OVARIAN ENDOMETRIOID ADENOCARCINOMA, FIGO GRADE II, 12 CM. - THE OVARIAN SURFACE IS FOCALLY INVOLVED BY CARCINOMA. - NEGATIVE FOR LYMPHOVASCULAR INVASION. - BENIGN UNREMARKABLE FALLOPIAN TUBE, NEGATIVE FOR CARCINOMA. - SEE ONCOLOGY TABLE. - SEE NOTE 3. Cul-de-sac biopsy - METASTATIC ADENOCARCINOMA, MOST CONSISTENT WITH PRIMARY OVARIAN ENDOMETRIOID ADENOCARCINOMA. 4. Ovary, left - BENIGN UNREMARKABLE OVARY, NEGATIVE FOR MALIGNANCY. Microscopic Comment 1. UTERUS, CARCINOMA OR CARCINOSARCOMA Procedure: Total hysterectomy with bilateral salpingo-oophorectomy. Histologic type: Endometrioid  adenocarcinoma. Histologic Grade: FIGO Grade I Myometrial invasion: Depth of invasion: 2 mm Myometrial thickness: 21 mm Uterine Serosa Involvement: Not identified Cervical stromal involvement: Not identified Extent of involvement of other organs: Not applicable Lymphovascular invasion: Not identified Regional Lymph Nodes: Examined: 0 Sentinel 0 Non-sentinel 0 Total Tumor block for ancillary studies: 1H MMR / MSI testing: Pending Pathologic Stage Classification (pTNM, AJCC 8th edition): pT1a, pNX (v4.1.0.0) 2. OVARY or FALLOPIAN TUBE or PRIMARY PERITONEUM: Procedure: Salpingo-oophorectomy Specimen Integrity: Intact Tumor Site: Right ovary Ovarian Surface Involvement (required only if applicable): Focally involved by carcinoma Fallopian Tube Surface Involvement (required only if applicable): Not identified Tumor Size: 12 cm Histologic Type: Endometrioid adenocarcinoma Histologic Grade: Grade II Implants (required for advanced stage serous/seromucinous borderline tumors only): Not applicable Other Tissue/ Organ Involvement: Cul de sac biopsy involved by tumor Largest Extrapelvic Peritoneal Focus (required only if applicable): Not applicable Peritoneal/Ascitic Fluid: Negative for carcinoma (case # [id number]) Treatment Effect (required only for high-grade serous carcinomas): Not applicable Regional Lymph Nodes: No lymph nodes submitted or found Number of Lymph Nodes Examined: 0 Pathologic Stage Classification (pTNM, AJCC 8th Edition): pT2b, pN0 Representative Tumor Block: 2B and 2E 1. Molecular study for microsatellite instability and immunohistochemical stains for MMR-related proteins are pending and will be reported in an addendum. 2. Immunohistochemical stain show that the ovarian tumor is positive for CK7 and PAX8 (both diffuse), CDX2 (patchy and weak); and negative for CK20. This immunoprofile is consistent with the above diagnosis. Dr. Lyndon Code has reviewed this case and concurs  with the above diagnosis. Molecular study for microsatellite instability and immunohistochemical stains for MMR-related proteins are pending and will be reported in an addendum    08/30/2017 Genetic Testing    Patient has genetic testing done for MSI  Results revealed patient has  the following mutation(s): loss of [facility] 2    08/30/2017 Surgery    Surgeon: Mart Piggs, MD Pre-operative Diagnosis:  1. Adnexal mass 2. Abnormal uterine bleeding 3. H/o Cecal CA  Post-operative Diagnosis:  1. Adhesive disease post colon resection 2. Endometrial cancer NOS 3. Adenocarcinoma unknown origin, right ovary, suspicious for GI primary  Operation:  1. Lysis of adhesions ~30 minutes 2. Robotic-assisted laparoscopic total hysterectomy with right salpingo-oophorectomy and left salpingectomy 3. Left oophorectomy (RA-laparoscopic) 4. Pelvic washings  Findings: Adhesions of omentum to anterior abdominal wall. Enlarged cystic right ovary ~10cm. Uterus had small nodules on serosa near where right adnexa was intimate with the surface. No obvious intraoperative rupture of cyst, although in 2 areas the wall was thin and one of these areas had some bleeding. Slight scarring of left bladder dome to LUS/cervix. Uterus on frozen section c/w hyperplasia and a small focus of endometrial CA - no myo invasion, <2cm in size. Frozen section on the right adnexa was carcinoma, met from colon or possibly Gyn, favor GI primary, defer to permanent. Left ovary was WNL.      09/15/2017 Cancer Staging    Staging form: Corpus Uteri - Carcinoma and Carcinosarcoma, AJCC 8th Edition - Pathologic: FIGO Stage IA (pT1a, pN0, cM0) - Signed by Heath Lark, MD on 09/15/2017    09/26/2017 Procedure    Successful placement of a right internal jugular approach power injectable Port-A-Cath. The catheter is ready for immediate use.    11/07/2017 Imaging    1. Since 08/18/2017, similar to slight decrease in size of a pancreatic body/tail  junction lesion. Cross modality comparison relative to 09/16/2017 MRI is also grossly similar. 2. No evidence of metastatic disease. 3. Aortic Atherosclerosis (ICD10-I70.0).  4. Left nephrolithiasis.    11/18/2017 Genetic Testing    MSH2 c.2237dupT pathogenic mutation identified in the CancerNext panel.  The CancerNext gene panel offered by [employer] includes sequencing and rearrangement analysis for the following 34 genes:   APC, ATM, BARD1, BMPR1A, BRCA1, BRCA2, BRIP1, CDH1, CDK4, CDKN2A, CHEK2, DICER1, HOXB13, EPCAM, GREM1, MLH1, MRE11A, MSH2, MSH6, MUTYH, NBN, NF1, PALB2, PMS2, POLD1, POLE, PTEN, RAD50, RAD51C, RAD51D, SMAD4, SMARCA4, STK11, and TP53.  The report date is November 18, 2017.  MSH2 [id number] pathogenic mutation identified on somatic testing.  These results are consistent with a diagnosis of Lynch syndrome.     Secondary malignant neoplasm of right ovary ([place])   08/23/2017 Tumor Marker    Patient's tumor was tested for the following markers: CA-125 Results of the tumor marker test revealed 139.8    08/31/2017 Initial Diagnosis    Secondary malignant neoplasm of right ovary ([place])    09/15/2017 Cancer Staging    Staging form: Ovary, Fallopian Tube, and Primary Peritoneal Carcinoma, AJCC 8th Edition - Pathologic: Stage IIB (pT2b, pN0, cM0) - Signed by Heath Lark, MD on 09/15/2017    09/27/2017 Imaging    No evidence of metastatic disease or other acute findings within the thorax.  4 cm low-attenuation mass in pancreatic tail, highly suspicious for pancreatic carcinoma. This is caused splenic vein thrombosis, with new venous collaterals in the left upper quadrant. Consider endoscopic ultrasound with FNA for tissue diagnosis.  Stable benign hepatic hemangioma.     09/27/2017 Tumor Marker    Patient's tumor was tested for the following markers: CA-125 Results of the tumor marker test revealed 39.4    11/02/2017 Tumor Marker    Patient's tumor was tested for  the following markers: CA-125 Results  of the tumor marker test revealed 19    11/07/2017 Tumor Marker    Patient's tumor was tested for the following markers: CA-125 Results of the tumor marker test revealed 18.3    11/18/2017 Genetic Testing    MSH2 c.2237dupT pathogenic mutation identified in the CancerNext panel.  The CancerNext gene panel offered by [employer] includes sequencing and rearrangement analysis for the following 34 genes:   APC, ATM, BARD1, BMPR1A, BRCA1, BRCA2, BRIP1, CDH1, CDK4, CDKN2A, CHEK2, DICER1, HOXB13, EPCAM, GREM1, MLH1, MRE11A, MSH2, MSH6, MUTYH, NBN, NF1, PALB2, PMS2, POLD1, POLE, PTEN, RAD50, RAD51C, RAD51D, SMAD4, SMARCA4, STK11, and TP53.  The report date is November 18, 2017.  MSH2 [id number] pathogenic mutation identified on somatic testing.  These results are consistent with a diagnosis of Lynch syndrome.    12/15/2017 Tumor Marker    Patient's tumor was tested for the following markers: CA-125 Results of the tumor marker test revealed 57.3    01/16/2018 Tumor Marker    Patient's tumor was tested for the following markers: CA-125 Results of the tumor marker test revealed 24.2     Pancreatic cancer ([place])   10/13/2017 Pathology Results    Pancreas tail mass, endoscopic ultrasound-guided, fine needle aspiration II (smears and cell block): Adenocarcinoma    11/18/2017 Genetic Testing    MSH2 c.2237dupT pathogenic mutation identified in the CancerNext panel.  The CancerNext gene panel offered by [employer] includes sequencing and rearrangement analysis for the following 34 genes:   APC, ATM, BARD1, BMPR1A, BRCA1, BRCA2, BRIP1, CDH1, CDK4, CDKN2A, CHEK2, DICER1, HOXB13, EPCAM, GREM1, MLH1, MRE11A, MSH2, MSH6, MUTYH, NBN, NF1, PALB2, PMS2, POLD1, POLE, PTEN, RAD50, RAD51C, RAD51D, SMAD4, SMARCA4, STK11, and TP53.  The report date is November 18, 2017.  MSH2 [id number] pathogenic mutation identified on somatic testing.  These  results are consistent with a diagnosis of Lynch syndrome.    11/24/2017 Pathology Results    A. "TAIL OF PANCREAS AND SPLEEN", DISTAL PANCREATECTOMY AND SPLENECTOMY: Invasive ductal adenocarcinoma, moderately to poorly differentiated with focal signet ring cell features, of pancreas (distal).   The carcinoma is 2.5 cm in greatest dimension grossly.   Treatment effects present in the form of fibrosis (50%). No lymphovascular or definite perineural invasion identified. All surgical margins are negative for tumor or high-grade dysplasia. Adjacent mucinous neoplasm, most consistent with Intraductal papillary mucinous neoplasm (IPMN) with low-grade dysplasia.   Uninvolved pancreas show atrophy and focal acute inflammation.   Twelve benign lymph nodes (0/12). Spleen with no significant histopathologic abnormalities.  PROCEDURE: distal pancreatectomy and splenectomy TUMOR SITE: distal pancreas TUMOR SIZE:  GREATEST DIMENSION: 2.5 cm  ADDITIONAL DIMENSIONS:  x  cm HISTOLOGIC TYPE: ductal adenocarcinoma HISTOLOGIC GRADE: grade 3 TUMOR EXTENSION: peripancreatic soft tissue MARGINS: negative for tumor TREATMENT EFFECT: treatment effects present in the form of fibrosis (50%). LYMPHOVASCULAR INVASION: not identified PERINEURAL INVASION: no definite evidence REGIONAL LYMPH NODES:   NUMBER OF LYMPH NODES INVOLVED: 0   NUMBER OF LYMPH NODES EXAMINED: 12 PATHOLOGIC STAGE CLASSIFICATION (pTNM, AJCC 8th Ed): ypT2, ypN0 DISTANT METASTASIS (pM): pMx ADDITIONAL PATHOLOGIC FINDINGS: mucinous neoplasm, most consistent with intraductal papillary mucinous neoplasm (IPMN) with low-grade dysplasia, is identified adjacent to the main tumor.    11/24/2017 Cancer Staging    Staging form: Exocrine Pancreas, AJCC 8th Edition - Pathologic stage from 11/24/2017: Stage IB (pT2, pN0, cM0) - Signed by Truitt Merle, MD on 01/03/2018    11/25/2017 Surgery    She  had surgery at [facility] 1. Exploratory Laparotomy 2. Distal  Pancreatectomy and Splenectomy 3. Intraoperative Ultrasound 4. Open Cholecystectomy     12/15/2017 Cancer Staging    Staging form: Exocrine Pancreas, AJCC 8th Edition - Clinical: Stage IB (cT2, cN0, cM0) - Signed by Heath Lark, MD on 12/15/2017    12/28/2017 Imaging    12/28/2017 CT Abdomen  IMPRESSION: 1. Postoperative findings from recent partial pancreatectomy including a 21 cubic cm fluid collection along the pancreatic resection margin which could represent early pseudocyst. 2. Nodularity along the lateral limb of the left adrenal gland could also be postoperative but merit surveillance, as the pancreatic lesion was in close proximity to this adrenal gland on the prior CT of 11/07/2017. 3. Asymmetric fullness inferiorly in the left breast. The patient has a history of prior left breast procedures, correlation with mammography is recommended. 4. Other imaging findings of potential clinical significance: Stable hemangioma in the left hepatic lobe. Splenectomy. Aortic Atherosclerosis (ICD10-I70.0). Right hemicolectomy. Small focus of fat necrosis in the right anterior abdominal wall subcutaneous tissues near the laparotomy site. Bilateral nonobstructive nephrolithiasis.    01/02/2018 Tumor Marker    Patient's tumor was tested for the following markers: CA-19-9 Results of the tumor marker test revealed 5    01/03/2018 -  Chemotherapy    She received modified dose FOLFIRINOX    CURRENT THERAPY: adjuvant FOLFIRINOX, status post 2 cycles.  INTERVAL HISTORY: Dana Hicks returns today for follow up as scheduled. She was seen by Dr. Burr Medico last week who delayed cycle 2, then was reevaluated by Mikey Bussing NP prior to [place] on 11/30. She received dose-reduced irinotecan and Udenyca on 12/2. During irinotecan she developed slurred speech and eye twitching. Dr. Julien Nordmann was notified. The infusion was stopped and saline given,  symptoms resolved and she was able to complete last 30 minutes of the infusion without further difficulty. She did not require supportive meds such as benadryl, pepcid, or steroids. So far this treatment has gone much better. She has significantly less nausea and diarrhea than with cycle 1. She has 1-2 formed stool per day. She takes anti-emetics and lomotil prophylactically. She is taking dex 4 mg daily, she will complete 5 day course tomorrow.  While at home receiving 5FU she experienced episodes of chest pain and dyspnea on exertion, none at rest. She had an episode earlier today. She had similar symptoms with carbo/taxol and with cycle 1 FOLFIRINOX, but improved with this cycle. She increases omeprazole to BID when she has these episodes, which helps. Denies cough, leg swelling or pain. She has diffuse itching without rash. Benadryl helps. She has some dizziness that she attributes to the combination of all of her supportive meds. She is eating and drinking well. Denies mucositis, fever, chills, cough. Cold sensitivity is present today on day 5, she manages well.    MEDICAL HISTORY:  Past Medical History:  Diagnosis Date  . Allergic rhinitis, seasonal   . Cecal cancer ([place]) 2001   Stage II (T3N0)  04-11-2001cecum-partial colectomy and completed chemo 2002  . Endometrial cancer ([place]) 08/2017   Stage IA, Grade 1  . History of MRSA infection 01/2017   followed by infectious disease center--  recurrent pustular folliculitis  . Nephrolithiasis    bilateral nonobstructive calculi per CT 08-18-2017  . OA (osteoarthritis)   . Ovarian cancer, right ([place]) 08/2017   Stage II Grade 2 Endometrioid  . Pancreatic cancer ([place])   . Rheumatoid arthritis [facility])    rheumatologist-  dr devenswar-- treated w/ oral prednisone daily and methotrexate injection every 3  wks    SURGICAL HISTORY: Past Surgical History:  Procedure Laterality Date  . BREAST LUMPECTOMY WITH RADIOACTIVE SEED LOCALIZATION Left 06/28/2014     Benign Procedure: RADIOACTIVE SEED LOCALIZATION LEFT BREAST LUMPECTOMY;  Surgeon: Excell Seltzer, MD;  Location: [facility];  Service: General;  Laterality: Left;  . COLONOSCOPY  06/19/14  . EYE SURGERY Left    plug in tear duct  . IR IMAGING GUIDED PORT INSERTION  09/26/2017  . KNEE ARTHROSCOPY    . PANCREATECTOMY  11/25/2017  . PORT A CATH REVISION  2001   in and out  . RIGHT COLECTOMY  06/02/2009   cecum cancer  . ROBOTIC ASSISTED TOTAL HYSTERECTOMY WITH BILATERAL SALPINGO OOPHERECTOMY Bilateral 08/30/2017   Procedure: XI ROBOTIC ASSISTED TOTAL  HYSTERECTOMY BILATERAL  SALPINGO OOPHORECTOMY; LYSIS OF ADHESIONS;  Surgeon: Isabel Caprice, MD;  Location: [facility];  Service: Gynecology;  Laterality: Bilateral;  . SPLENECTOMY, PARTIAL  11/25/2017    I have reviewed the social history and family history with the patient and they are unchanged from previous note.  ALLERGIES:  is allergic to codeine; penicillins; and bactrim [sulfamethoxazole-trimethoprim].  MEDICATIONS:  Current Outpatient Medications  Medication Sig Dispense Refill  . acetaminophen (TYLENOL) 650 MG CR tablet Take 1,300 mg by mouth every 8 (eight) hours.    Marland Kitchen dicyclomine (BENTYL) 10 MG capsule Take 1 capsule (10 mg total) by mouth every 8 (eight) hours as needed for spasms. 20 capsule 0  . diphenoxylate-atropine (LOMOTIL) 2.5-0.025 MG tablet Take 1-2 tablets by mouth 4 (four) times daily as needed for diarrhea or loose stools. 1 to 2 po QID prn diarrhea 60 tablet 1  . lidocaine-prilocaine (EMLA) cream Apply to affected area once (Patient taking differently: Apply 1 application topically daily as needed (port). Apply to affected area once) 30 g 3  . loratadine (CLARITIN) 10 MG tablet Take 10 mg by mouth daily after breakfast.    . LORazepam (ATIVAN) 0.5 MG tablet Place 1 tablet (0.5 mg total) under the tongue every 6 (six) hours as needed for anxiety. 30 tablet 1  . meclizine (MEDI-MECLIZINE) 25 MG tablet Take  1 tablet (25 mg total) by mouth 3 (three) times daily as needed for dizziness. 30 tablet 1  . naproxen sodium (ALEVE) 220 MG tablet Take 220 mg by mouth.    Marland Kitchen omeprazole (PRILOSEC) 20 MG capsule Take 20 mg by mouth 2 (two) times daily before a meal.     . ondansetron (ZOFRAN) 8 MG tablet Take 1 tablet (8 mg total) by mouth every 8 (eight) hours as needed for refractory nausea / vomiting. Start on day 3 after chemo. 30 tablet 1  . predniSONE (DELTASONE) 10 MG tablet TAKE 1 TABLET BY MOUTH EVERY DAY WITH BREAKFAST (Patient taking differently: Take 5 mg by mouth daily with breakfast. ) 30 tablet 0  . predniSONE (DELTASONE) 5 MG tablet Take 1 tablet (5 mg total) by mouth daily with breakfast. 30 tablet 1  . prochlorperazine (COMPAZINE) 10 MG tablet Take 1 tablet (10 mg total) by mouth every 6 (six) hours as needed (Nausea or vomiting). 30 tablet 1  . simethicone (MYLICON) 387 MG chewable tablet Chew 125 mg by mouth every 6 (six) hours as needed for flatulence.    . traMADol (ULTRAM) 50 MG tablet Take 1-2 tablets (50-100 mg total) by mouth every 6 (six) hours as needed. 90 tablet 0  . HYDROmorphone (DILAUDID) 4 MG tablet Take 1 tablet (4 mg total) by mouth every 4 (four)  hours as needed for severe pain. 30 tablet 0  . hydrOXYzine (ATARAX/VISTARIL) 10 MG tablet Take 1 tablet (10 mg total) by mouth 3 (three) times daily as needed for itching. 30 tablet 0   No current facility-administered medications for this visit.     PHYSICAL EXAMINATION: ECOG PERFORMANCE STATUS: 1 - Symptomatic but completely ambulatory  Vitals:   01/25/18 1431 01/25/18 1539  BP: (!) 125/93 120/80  Pulse: (!) 107   Resp: 18   Temp: 98.8 F (37.1 C)   SpO2: 98%    Filed Weights   01/25/18 1431  Weight: 207 lb 9.6 oz (94.2 kg)    GENERAL:alert, no distress and comfortable SKIN: no rashes or significant lesions EYES: sclera clear OROPHARYNX:no thrush or ulcers LUNGS: clear to auscultation, with normal breathing  effort HEART: regular rate & rhythm, no lower extremity edema ABDOMEN:abdomen soft, non-tender and normal bowel sounds. Abdominal incision healed well NEURO: alert & oriented x 3 with fluent speech, no focal motor/sensory deficits PAC without erythema   LABORATORY DATA:  I have reviewed the data as listed CBC Latest Ref Rng & Units 01/25/2018 01/20/2018 01/16/2018  WBC 4.0 - 10.5 K/uL 75.6(HH) 16.9(H) 14.0(H)  Hemoglobin 12.0 - 15.0 g/dL 10.2(L) 10.2(L) 11.5(L)  Hematocrit 36.0 - 46.0 % 33.9(L) 34.1(L) 37.5  Platelets 150 - 400 K/uL 536(H) 599(H) 337     CMP Latest Ref Rng & Units 01/25/2018 01/20/2018 01/16/2018  Glucose 70 - 99 mg/dL 171(H) 144(H) 128(H)  BUN 6 - 20 mg/dL '16 9 6  ' Creatinine 0.44 - 1.00 mg/dL 0.74 0.65 0.70  Sodium 135 - 145 mmol/L 138 142 139  Potassium 3.5 - 5.1 mmol/L 3.7 3.4(L) 2.8(LL)  Chloride 98 - 111 mmol/L 101 106 99  CO2 22 - 32 mmol/L '26 23 26  ' Calcium 8.9 - 10.3 mg/dL 8.3(L) 8.5(L) 9.0  Total Protein 6.5 - 8.1 g/dL 6.1(L) 5.9(L) 6.5  Total Bilirubin 0.3 - 1.2 mg/dL 0.3 <0.2(L) <0.2(L)  Alkaline Phos 38 - 126 U/L 87 62 73  AST 15 - 41 U/L 17 7(L) 13(L)  ALT 0 - 44 U/L 49(H) 20 32      RADIOGRAPHIC STUDIES: I have personally reviewed the radiological images as listed and agreed with the findings in the report. No results found.   ASSESSMENT & PLAN: Dana Hicks is a 42 y.o. female with history of   1.Pancreaticadenocarcinoma in tail, pT2N0M0, stage IB s/p surgical resection  -she is currently on adjuvant FOLFIRINOX every 2 weeks with G-CSF on day 3. -She unfortunately developed a severe diarrhea, nausea, dehydration, poor oral intake, after first cycle of chemo, required frequent IV fluids and supportive care. -cycle 2 was delayed and irinotecan was dose-reduced  -she appears improved today, she tolerated cycle 2 better so far, with less n/v and diarrhea. She continues preventative medication, she is tolerating adequate po fluids.  -she  developed itching without rash, benadryl is not helping much. I prescribed hydroxyzine for her to try. Will monitor.  -Labs reviewed, WBC 75.6, predominant neutrophilia ANC 74.8; she received Udenyca on 12/2 and is on oral steroids. No other infectious symptoms. Will defer to Dr. Alvy Bimler whether to continue [facility] with subsequent cycles. Hgb stable. CMP unremarkable. Bg 171, likely related to diet and steroids.  -f/u with Dr. Alvy Bimler in 2 weeks with cycle 3, will CC my note to her.   2. Exertional chest pain and dyspnea -she has episodes of exertional chest pain and dyspnea after chemotherapy, etiology is unclear. I reviewed  potential vasospasm with 5FU, however she notes this occurred during taxol/carbo as well. Symptoms are better with cycle 2. I obtained EKG today, which is unremarkable. I encouraged her to call [facility] or report to ED if symptoms return during 5FU or she has associated cough, palpitations, lightheadedness, diaphoresis, etc. She understands.   3. Nausea, diarrhea, anorexia and dehydration  -Secondary to chemotherapy -Improved with IV fluids, supportive care, and dose-reduced irinotecan with cycle 2 -she appears improved today, VS and labs stable, eating and drinking well. No need for IVF today  4. IDA -Hgb 10.2 today, stable  -she is holding iron pills for now, given her difficulty with n/v after chemo  5. Secondary malignant neoplasm of right ovary ([place]) - She previously had surgery and 2 cycled on Carbo/Taxol chemo, on hold for pancreatic cancer treatment -f/u with Gyn onc   6. RA with negative RF -previously on prednisone  7. History of splenectomy -She is up-to-date with her recommended vaccinations  8. Lynch Syndrome - with colon, pancreatic, and ovarian cancers -She will continue recommended cancer screening  8. Socioeconomic stresses, previously referred to [place], EKG reviewed  -No need for IVF today, continue po nutrition and hydration   -Rx: hydroxyzine for itching  -CC note and f/u with Dr. Alvy Bimler in 2 weeks with cycle 3  Orders Placed This Encounter  Procedures  . EKG 12-Lead   All questions were answered. The patient knows to call the clinic with any problems, questions or concerns. No barriers to learning was detected. I spent 20 minutes counseling the patient face to face. The total time spent in the appointment was 25 minutes and more than 50% was on counseling and review of test results     Alla Feeling, NP 01/25/18

## 2018-01-26 LAB — CA 125: Cancer Antigen (CA) 125: 23.8 U/mL (ref 0.0–38.1)

## 2018-01-30 ENCOUNTER — Other Ambulatory Visit: Payer: BLUE CROSS/BLUE SHIELD

## 2018-01-30 ENCOUNTER — Ambulatory Visit: Payer: BLUE CROSS/BLUE SHIELD

## 2018-01-30 ENCOUNTER — Ambulatory Visit: Payer: BLUE CROSS/BLUE SHIELD | Admitting: Hematology

## 2018-02-02 ENCOUNTER — Other Ambulatory Visit: Payer: Self-pay | Admitting: Rheumatology

## 2018-02-02 DIAGNOSIS — C252 Malignant neoplasm of tail of pancreas: Secondary | ICD-10-CM | POA: Diagnosis not present

## 2018-02-02 NOTE — Telephone Encounter (Signed)
Last Visit: 12/26/17 Next Visit: 05/15/18  Okay to refill per Dr. Estanislado Pandy

## 2018-02-06 ENCOUNTER — Other Ambulatory Visit: Payer: Self-pay | Admitting: Hematology and Oncology

## 2018-02-06 ENCOUNTER — Telehealth: Payer: Self-pay

## 2018-02-06 ENCOUNTER — Inpatient Hospital Stay (HOSPITAL_BASED_OUTPATIENT_CLINIC_OR_DEPARTMENT_OTHER): Payer: BLUE CROSS/BLUE SHIELD | Admitting: Hematology and Oncology

## 2018-02-06 ENCOUNTER — Inpatient Hospital Stay: Payer: BLUE CROSS/BLUE SHIELD

## 2018-02-06 VITALS — BP 135/93 | HR 79 | Temp 98.4°F | Resp 18 | Ht 62.0 in | Wt 214.4 lb

## 2018-02-06 DIAGNOSIS — R300 Dysuria: Secondary | ICD-10-CM

## 2018-02-06 DIAGNOSIS — C541 Malignant neoplasm of endometrium: Secondary | ICD-10-CM

## 2018-02-06 DIAGNOSIS — D509 Iron deficiency anemia, unspecified: Secondary | ICD-10-CM | POA: Diagnosis not present

## 2018-02-06 DIAGNOSIS — E860 Dehydration: Secondary | ICD-10-CM | POA: Diagnosis not present

## 2018-02-06 DIAGNOSIS — C252 Malignant neoplasm of tail of pancreas: Secondary | ICD-10-CM | POA: Diagnosis not present

## 2018-02-06 DIAGNOSIS — G620 Drug-induced polyneuropathy: Secondary | ICD-10-CM

## 2018-02-06 DIAGNOSIS — C7961 Secondary malignant neoplasm of right ovary: Secondary | ICD-10-CM | POA: Diagnosis not present

## 2018-02-06 DIAGNOSIS — T451X5A Adverse effect of antineoplastic and immunosuppressive drugs, initial encounter: Secondary | ICD-10-CM

## 2018-02-06 DIAGNOSIS — E8941 Symptomatic postprocedural ovarian failure: Secondary | ICD-10-CM

## 2018-02-06 DIAGNOSIS — R079 Chest pain, unspecified: Secondary | ICD-10-CM | POA: Diagnosis not present

## 2018-02-06 DIAGNOSIS — I700 Atherosclerosis of aorta: Secondary | ICD-10-CM | POA: Diagnosis not present

## 2018-02-06 DIAGNOSIS — Z5111 Encounter for antineoplastic chemotherapy: Secondary | ICD-10-CM | POA: Diagnosis not present

## 2018-02-06 DIAGNOSIS — Z1509 Genetic susceptibility to other malignant neoplasm: Secondary | ICD-10-CM | POA: Diagnosis not present

## 2018-02-06 DIAGNOSIS — Z5189 Encounter for other specified aftercare: Secondary | ICD-10-CM | POA: Diagnosis not present

## 2018-02-06 DIAGNOSIS — R110 Nausea: Secondary | ICD-10-CM

## 2018-02-06 DIAGNOSIS — J00 Acute nasopharyngitis [common cold]: Secondary | ICD-10-CM | POA: Diagnosis not present

## 2018-02-06 DIAGNOSIS — M170 Bilateral primary osteoarthritis of knee: Secondary | ICD-10-CM | POA: Diagnosis not present

## 2018-02-06 DIAGNOSIS — M069 Rheumatoid arthritis, unspecified: Secondary | ICD-10-CM | POA: Diagnosis not present

## 2018-02-06 DIAGNOSIS — R197 Diarrhea, unspecified: Secondary | ICD-10-CM | POA: Diagnosis not present

## 2018-02-06 DIAGNOSIS — R5381 Other malaise: Secondary | ICD-10-CM

## 2018-02-06 DIAGNOSIS — R748 Abnormal levels of other serum enzymes: Secondary | ICD-10-CM | POA: Diagnosis not present

## 2018-02-06 LAB — CMP (CANCER CENTER ONLY)
ALT: 46 U/L — ABNORMAL HIGH (ref 0–44)
AST: 20 U/L (ref 15–41)
Albumin: 3.4 g/dL — ABNORMAL LOW (ref 3.5–5.0)
Alkaline Phosphatase: 93 U/L (ref 38–126)
Anion gap: 12 (ref 5–15)
BUN: 13 mg/dL (ref 6–20)
CO2: 24 mmol/L (ref 22–32)
Calcium: 8.8 mg/dL — ABNORMAL LOW (ref 8.9–10.3)
Chloride: 107 mmol/L (ref 98–111)
Creatinine: 0.63 mg/dL (ref 0.44–1.00)
GFR, Est AFR Am: >60 mL/min (ref >60)
GFR, Estimated: >60 mL/min (ref >60)
Glucose, Bld: 114 mg/dL — ABNORMAL HIGH (ref 70–99)
Potassium: 4 mmol/L (ref 3.5–5.1)
Sodium: 143 mmol/L (ref 135–145)
Total Bilirubin: <0.2 mg/dL — ABNORMAL LOW (ref 0.3–1.2)
Total Protein: 6.1 g/dL — ABNORMAL LOW (ref 6.5–8.1)

## 2018-02-06 LAB — CBC WITH DIFFERENTIAL (CANCER CENTER ONLY)
Abs Immature Granulocytes: 1.27 10*3/uL — ABNORMAL HIGH (ref 0.00–0.07)
Basophils Absolute: 0.1 10*3/uL (ref 0.0–0.1)
Basophils Relative: 1 %
Eosinophils Absolute: 0.1 10*3/uL (ref 0.0–0.5)
Eosinophils Relative: 1 %
HCT: 34.5 % — ABNORMAL LOW (ref 36.0–46.0)
Hemoglobin: 10.4 g/dL — ABNORMAL LOW (ref 12.0–15.0)
Immature Granulocytes: 7 %
Lymphocytes Relative: 26 %
Lymphs Abs: 4.9 10*3/uL — ABNORMAL HIGH (ref 0.7–4.0)
MCH: 27.9 pg (ref 26.0–34.0)
MCHC: 30.1 g/dL (ref 30.0–36.0)
MCV: 92.5 fL (ref 80.0–100.0)
Monocytes Absolute: 1.3 10*3/uL — ABNORMAL HIGH (ref 0.1–1.0)
Monocytes Relative: 7 %
Neutro Abs: 11.4 10*3/uL — ABNORMAL HIGH (ref 1.7–7.7)
Neutrophils Relative %: 58 %
Platelet Count: 321 10*3/uL (ref 150–400)
RBC: 3.73 MIL/uL — ABNORMAL LOW (ref 3.87–5.11)
RDW: 21.4 % — ABNORMAL HIGH (ref 11.5–15.5)
WBC Count: 19 10*3/uL — ABNORMAL HIGH (ref 4.0–10.5)
nRBC: 2.6 % — ABNORMAL HIGH (ref 0.0–0.2)

## 2018-02-06 LAB — MAGNESIUM: Magnesium: 2 mg/dL (ref 1.7–2.4)

## 2018-02-06 MED ORDER — GABAPENTIN 300 MG PO CAPS: 300.0000 mg | ORAL_CAPSULE | Freq: Three times a day (TID) | ORAL | 1 refills | Status: DC

## 2018-02-06 MED ORDER — DIPHENOXYLATE-ATROPINE 2.5-0.025 MG PO TABS: 1.0000 | ORAL_TABLET | Freq: Four times a day (QID) | ORAL | 1 refills | Status: DC | PRN

## 2018-02-06 MED ORDER — SODIUM CHLORIDE 0.9% FLUSH
10.0000 mL | Freq: Once | INTRAVENOUS | Status: AC
  Administered 2018-02-06: 10 mL
  Filled 2018-02-06: qty 10

## 2018-02-06 NOTE — Telephone Encounter (Signed)
Please add lab appt tomorrow for urine test only I will place order. Need to confirm before treating Just take her pain medicine for back pain for now

## 2018-02-06 NOTE — Telephone Encounter (Signed)
She forgot to mention at today's appt. She is having lower back pain and thinks she has a UTI. She is having pain and urgency with urination.

## 2018-02-06 NOTE — Telephone Encounter (Signed)
Called back and left below message and ask her to call the office for questions. Scheduling message sent to add lab appt.

## 2018-02-07 ENCOUNTER — Inpatient Hospital Stay: Payer: BLUE CROSS/BLUE SHIELD

## 2018-02-07 ENCOUNTER — Ambulatory Visit (HOSPITAL_BASED_OUTPATIENT_CLINIC_OR_DEPARTMENT_OTHER): Payer: BLUE CROSS/BLUE SHIELD | Admitting: Medical

## 2018-02-07 ENCOUNTER — Telehealth: Payer: Self-pay | Admitting: Hematology and Oncology

## 2018-02-07 VITALS — BP 134/75 | HR 85 | Temp 98.5°F | Resp 18

## 2018-02-07 DIAGNOSIS — C541 Malignant neoplasm of endometrium: Secondary | ICD-10-CM

## 2018-02-07 DIAGNOSIS — Z5189 Encounter for other specified aftercare: Secondary | ICD-10-CM | POA: Diagnosis not present

## 2018-02-07 DIAGNOSIS — E860 Dehydration: Secondary | ICD-10-CM | POA: Diagnosis not present

## 2018-02-07 DIAGNOSIS — C252 Malignant neoplasm of tail of pancreas: Secondary | ICD-10-CM | POA: Diagnosis not present

## 2018-02-07 DIAGNOSIS — R300 Dysuria: Secondary | ICD-10-CM

## 2018-02-07 DIAGNOSIS — T451X5A Adverse effect of antineoplastic and immunosuppressive drugs, initial encounter: Secondary | ICD-10-CM

## 2018-02-07 DIAGNOSIS — I700 Atherosclerosis of aorta: Secondary | ICD-10-CM | POA: Diagnosis not present

## 2018-02-07 DIAGNOSIS — G620 Drug-induced polyneuropathy: Secondary | ICD-10-CM | POA: Diagnosis not present

## 2018-02-07 DIAGNOSIS — M069 Rheumatoid arthritis, unspecified: Secondary | ICD-10-CM | POA: Diagnosis not present

## 2018-02-07 DIAGNOSIS — D509 Iron deficiency anemia, unspecified: Secondary | ICD-10-CM | POA: Diagnosis not present

## 2018-02-07 DIAGNOSIS — R5381 Other malaise: Secondary | ICD-10-CM | POA: Insufficient documentation

## 2018-02-07 DIAGNOSIS — R110 Nausea: Secondary | ICD-10-CM | POA: Insufficient documentation

## 2018-02-07 DIAGNOSIS — R748 Abnormal levels of other serum enzymes: Secondary | ICD-10-CM | POA: Diagnosis not present

## 2018-02-07 DIAGNOSIS — C7961 Secondary malignant neoplasm of right ovary: Secondary | ICD-10-CM | POA: Diagnosis not present

## 2018-02-07 DIAGNOSIS — J00 Acute nasopharyngitis [common cold]: Secondary | ICD-10-CM | POA: Diagnosis not present

## 2018-02-07 DIAGNOSIS — Z5111 Encounter for antineoplastic chemotherapy: Secondary | ICD-10-CM | POA: Diagnosis not present

## 2018-02-07 DIAGNOSIS — T8090XA Unspecified complication following infusion and therapeutic injection, initial encounter: Secondary | ICD-10-CM

## 2018-02-07 DIAGNOSIS — R079 Chest pain, unspecified: Secondary | ICD-10-CM | POA: Diagnosis not present

## 2018-02-07 DIAGNOSIS — M170 Bilateral primary osteoarthritis of knee: Secondary | ICD-10-CM | POA: Diagnosis not present

## 2018-02-07 DIAGNOSIS — Z1509 Genetic susceptibility to other malignant neoplasm: Secondary | ICD-10-CM | POA: Diagnosis not present

## 2018-02-07 LAB — URINALYSIS, COMPLETE (UACMP) WITH MICROSCOPIC
Bilirubin Urine: NEGATIVE
Glucose, UA: NEGATIVE mg/dL
Hgb urine dipstick: NEGATIVE
Ketones, ur: NEGATIVE mg/dL
Leukocytes, UA: NEGATIVE
Nitrite: NEGATIVE
Protein, ur: NEGATIVE mg/dL
Specific Gravity, Urine: 1.025 (ref 1.005–1.030)
pH: 5.5 (ref 5.0–8.0)

## 2018-02-07 MED ORDER — LEUCOVORIN CALCIUM INJECTION 350 MG
400.0000 mg/m2 | Freq: Once | INTRAVENOUS | Status: AC
  Administered 2018-02-07: 828 mg via INTRAVENOUS
  Filled 2018-02-07: qty 41.4

## 2018-02-07 MED ORDER — ATROPINE SULFATE 1 MG/ML IJ SOLN
0.5000 mg | Freq: Once | INTRAMUSCULAR | Status: AC | PRN
  Administered 2018-02-07: 0.5 mg via INTRAVENOUS

## 2018-02-07 MED ORDER — SODIUM CHLORIDE 0.9 % IV SOLN
Freq: Once | INTRAVENOUS | Status: AC
  Administered 2018-02-07: 12:00:00 via INTRAVENOUS
  Filled 2018-02-07: qty 250

## 2018-02-07 MED ORDER — ATROPINE SULFATE 1 MG/ML IJ SOLN
INTRAMUSCULAR | Status: AC
  Filled 2018-02-07: qty 1

## 2018-02-07 MED ORDER — FAMOTIDINE IN NACL 20-0.9 MG/50ML-% IV SOLN
20.0000 mg | Freq: Once | INTRAVENOUS | Status: AC
  Administered 2018-02-07: 20 mg via INTRAVENOUS

## 2018-02-07 MED ORDER — DEXTROSE 5 % IV SOLN
Freq: Once | INTRAVENOUS | Status: AC
  Administered 2018-02-07: 08:00:00 via INTRAVENOUS
  Filled 2018-02-07: qty 250

## 2018-02-07 MED ORDER — SODIUM CHLORIDE 0.9 % IV SOLN
2410.0000 mg/m2 | INTRAVENOUS | Status: DC
  Administered 2018-02-07: 5000 mg via INTRAVENOUS
  Filled 2018-02-07: qty 100

## 2018-02-07 MED ORDER — OXALIPLATIN CHEMO INJECTION 100 MG/20ML
85.0000 mg/m2 | Freq: Once | INTRAVENOUS | Status: AC
  Administered 2018-02-07: 175 mg via INTRAVENOUS
  Filled 2018-02-07: qty 35

## 2018-02-07 MED ORDER — ONDANSETRON HCL 4 MG/2ML IJ SOLN
8.0000 mg | Freq: Once | INTRAMUSCULAR | Status: AC
  Administered 2018-02-07: 8 mg via INTRAVENOUS

## 2018-02-07 MED ORDER — SODIUM CHLORIDE 0.9 % IV SOLN
Freq: Once | INTRAVENOUS | Status: AC
  Administered 2018-02-07: 09:00:00 via INTRAVENOUS
  Filled 2018-02-07: qty 5

## 2018-02-07 MED ORDER — PALONOSETRON HCL INJECTION 0.25 MG/5ML
0.2500 mg | Freq: Once | INTRAVENOUS | Status: AC
  Administered 2018-02-07: 0.25 mg via INTRAVENOUS

## 2018-02-07 MED ORDER — ATROPINE SULFATE 1 MG/ML IJ SOLN
0.5000 mg | Freq: Once | INTRAMUSCULAR | Status: AC
  Administered 2018-02-07: 0.5 mg via INTRAVENOUS

## 2018-02-07 MED ORDER — ONDANSETRON HCL 4 MG/2ML IJ SOLN
INTRAMUSCULAR | Status: AC
  Filled 2018-02-07: qty 4

## 2018-02-07 MED ORDER — PALONOSETRON HCL INJECTION 0.25 MG/5ML
INTRAVENOUS | Status: AC
  Filled 2018-02-07: qty 5

## 2018-02-07 MED ORDER — IRINOTECAN HCL CHEMO INJECTION 100 MG/5ML
112.5000 mg/m2 | Freq: Once | INTRAVENOUS | Status: AC
  Administered 2018-02-07: 240 mg via INTRAVENOUS
  Filled 2018-02-07: qty 12

## 2018-02-07 MED ORDER — DIPHENHYDRAMINE HCL 50 MG/ML IJ SOLN
25.0000 mg | Freq: Once | INTRAMUSCULAR | Status: AC
  Administered 2018-02-07: 25 mg via INTRAVENOUS

## 2018-02-07 MED ORDER — FAMOTIDINE IN NACL 20-0.9 MG/50ML-% IV SOLN
INTRAVENOUS | Status: AC
  Filled 2018-02-07: qty 50

## 2018-02-07 NOTE — Telephone Encounter (Signed)
No 12/16 los.  °

## 2018-02-07 NOTE — Patient Instructions (Signed)
[facility] Discharge Instructions for Patients Receiving Chemotherapy  Today you received the following chemotherapy agents: Oxaliplatin (Eloxatin), Irinotecan (Camptosar), Leucovorin, Fluorouracil (Adrucil, 5-FU)  To help prevent nausea and vomiting after your treatment, we encourage you to take your nausea medication as directed. Received Aloxi during treatment today-->Take Compazine (not Zofran) for the next 3 days as needed.    If you develop nausea and vomiting that is not controlled by your nausea medication, call the clinic.   BELOW ARE SYMPTOMS THAT SHOULD BE REPORTED IMMEDIATELY:  *FEVER GREATER THAN 100.5 F  *CHILLS WITH OR WITHOUT FEVER  NAUSEA AND VOMITING THAT IS NOT CONTROLLED WITH YOUR NAUSEA MEDICATION  *UNUSUAL SHORTNESS OF BREATH  *UNUSUAL BRUISING OR BLEEDING  TENDERNESS IN MOUTH AND THROAT WITH OR WITHOUT PRESENCE OF ULCERS  *URINARY PROBLEMS  *BOWEL PROBLEMS  UNUSUAL RASH Items with * indicate a potential emergency and should be followed up as soon as possible.  Feel free to call the clinic should you have any questions or concerns. The clinic phone number is [phone].  Please show the [place] at check-in to the Emergency Department and triage nurse.

## 2018-02-07 NOTE — Assessment & Plan Note (Signed)
She is quite disabled from pain and side effects of treatment I will consult advanced home care service for symptom management and assessment and home infusion therapy

## 2018-02-07 NOTE — Progress Notes (Signed)
About 10 minutes into Irinotecan/Leucovorin infusion, pt stated she started feeling her eyes twitching, arms twitching, and like she was having trouble forming her words. Irinotecan/Leucovorin infusion stopped and clamped off, and 1 liter bag of NS hooked up to other PAC connection wide open. Pt stated her nose was running, so 0.5 Atropine administered through NS line at 12:26 (pt wanted to wait and see if she the Atropine was what caused her slurred speech during her last infusion). [facility] Client notified and came to assess patient. Received orders for 25 mg Benadryl IV. Orders placed and administered at 12:32 through NS line. BP and HR elevated but at pt's baseline from this morning. Pt denied any CP, SOB, or dizziness. Will continue to monitor.   After 15 min from Benadryl, pt asked to use the bathroom. Stated she felt stable enough to walk. Upon returning stated she was having trouble using her hands and they felt "tingly" (though that might be related to the cold-sensitivity from Oxaliplatin). Updated Lucianne Lei, and received orders for another 20 mg Pepcid IVPB. Orders placed and medication administered at 12:51. Will continue to monitor  At 13:21 pt stated she felt better and was ready to restart. Called Dr. Alvy Bimler with an update and received the OK to resume Irinotecan/Leucovorin infusions and to run at a slower rate. Infusions restarted at 13:35 and pumps programmed to run over 2 hours (changing Irinotecan rate to 233 mL/hr and Leucovorin rate to 134 mL/hr). Will continue to monitor  About an hour into slower infusion, pt's symptoms began returning again. Irinotecan/Leucovorin infusions paused and saline opened wide again at 14:17. Vitals take and stable. Dr. Alvy Bimler notified and received orders for another 0.5mg  Atropine IV. Orders placed and medication administered at 14:35. Updated Dr. Alvy Bimler that symptoms had resolved and received the OK to restart infusions at a slower rate.  IV pump showed 1 hour 14  mins left, changed "time duration" to 90 mins which changed Irinotecan rate to 191 mL/hr and Leucovorin rate to 110 mL/hr. Infusions restarted at 14:51. Will continue to monitor.   Vitals:   02/07/18 0816 02/07/18 1227 02/07/18 1420  BP: (!) 143/97 (!) 148/92 134/75  Pulse: 100 (!) 105 85  Resp: 18 20 18   Temp: 98.1 F (36.7 C) 98.5 F (36.9 C)   TempSrc: Oral Oral   SpO2: 99% 98% 96%

## 2018-02-07 NOTE — Assessment & Plan Note (Signed)
We will recheck urinalysis and urine culture to rule out UTI

## 2018-02-07 NOTE — Assessment & Plan Note (Signed)
I recommend a trial of gabapentin

## 2018-02-07 NOTE — Assessment & Plan Note (Signed)
I recommend daily fluids along with IV antiemetics

## 2018-02-07 NOTE — Progress Notes (Signed)
Patient c/o nausea with ambulation, subsided after sitting.  Patient requested if she can receive Zofran prior to discharge.  Dr. Alvy Bimler notified, verbal order received for Zofran 8mg  IV prior to discharge.

## 2018-02-07 NOTE — Assessment & Plan Note (Signed)
She is currently on steroid therapy and has gained excessive amount of weight I am hopeful that gabapentin that I prescribed for neuropathy and hot flashes will also help with arthritis pain management

## 2018-02-07 NOTE — Assessment & Plan Note (Signed)
She tolerated treatment poorly with major side effects including pancytopenia, severe nausea, diarrhea, pancytopenia and significant pain I recommend further dose reduction of irinotecan and to add premedication I plan to reduce premedication oral dexamethasone and only to use it after treatment for nausea I will consult advanced home care service to administer IV fluids along with IV antiemetics at home

## 2018-02-07 NOTE — Assessment & Plan Note (Signed)
Her last imaging study and CA 125 were not elevated We will plan to complete adjuvant treatment for pancreatic cancer for now Her next imaging study is due in February

## 2018-02-07 NOTE — Assessment & Plan Note (Signed)
She denies worsening neuropathy on treatment.  She will continue chronic pain management I plan to add gabapentin

## 2018-02-07 NOTE — Progress Notes (Signed)
[place] OFFICE PROGRESS NOTE  Patient Care Team: [name], MD as PCP - General (Family Medicine)  ASSESSMENT & PLAN:  Pancreatic cancer [facility]) She tolerated treatment poorly with major side effects including pancytopenia, severe nausea, diarrhea, pancytopenia and significant pain I recommend further dose reduction of irinotecan and to add premedication I plan to reduce premedication oral dexamethasone and only to use it after treatment for nausea I will consult advanced home care service to administer IV fluids along with IV antiemetics at home  Secondary malignant neoplasm of right ovary [facility]) Her last imaging study and CA 125 were not elevated We will plan to complete adjuvant treatment for pancreatic cancer for now Her next imaging study is due in February  Dysuria We will recheck urinalysis and urine culture to rule out UTI  Peripheral neuropathy due to chemotherapy [facility]) She denies worsening neuropathy on treatment.  She will continue chronic pain management I plan to add gabapentin  Osteoarthritis of both knees She is currently on steroid therapy and has gained excessive amount of weight I am hopeful that gabapentin that I prescribed for neuropathy and hot flashes will also help with arthritis pain management  Physical debility She is quite disabled from pain and side effects of treatment I will consult advanced home care service for symptom management and assessment and home infusion therapy  Chemotherapy-induced nausea I recommend daily fluids along with IV antiemetics  Hot flashes due to surgical menopause I recommend a trial of gabapentin   Orders Placed This Encounter  Procedures  . Ambulatory referral to Home Health    Referral Priority:   Routine    Referral Type:   Home Health Care    Referral Reason:   Specialty Services Required    Requested Specialty:   [place]    Number of Visits Requested:   1    INTERVAL  HISTORY: Please see below for problem oriented charting. 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 Mixed attenuation mass emanates from the right adnexa measuring 10.8 x 8.0 cm very suspicious for right ovarian carcinoma. 2. Abnormality of the tail of the pancreas may be due to mild pancreatitis and small pseudocyst formation, but neoplasm cannot be excluded. 3. Small amount of ascites within abdomen and pelvis. 4. Small nonobstructing renal calculi bilaterally.     08/30/2017 Pathology Results    1. Uterus and cervix, with left fallopian tube - ENDOMETRIOID ADENOCARCINOMA, FIGO GRADE I, ARISING IN A BACKGROUND OF DIFFUSE COMPLEX ATYPICAL HYPERPLASIA. - CARCINOMA INVADES FOR OF DEPTH OF 0.2 CM WHERE THICKNESS OF MYOMETRIAL WALL IS 2.1 CM. - ALL RESECTION MARGINS ARE NEGATIVE FOR CARCINOMA. - NEGATIVE FOR LYMPHOVASCULAR OR PERINEURAL INVASION. - CERVICAL STROMA IS NOT INVOLVED. - SEE ONCOLOGY TABLE. - SEE NOTE 2. Ovary and fallopian tube, right - PRIMARY OVARIAN ENDOMETRIOID ADENOCARCINOMA, FIGO GRADE II, 12 CM. - THE OVARIAN SURFACE IS FOCALLY INVOLVED BY CARCINOMA. - NEGATIVE FOR LYMPHOVASCULAR  INVASION. - BENIGN UNREMARKABLE FALLOPIAN TUBE, NEGATIVE FOR CARCINOMA. - SEE ONCOLOGY TABLE. - SEE NOTE 3. Cul-de-sac biopsy - METASTATIC ADENOCARCINOMA, MOST CONSISTENT WITH PRIMARY OVARIAN  ENDOMETRIOID ADENOCARCINOMA. 4. Ovary, left - BENIGN UNREMARKABLE OVARY, NEGATIVE FOR MALIGNANCY. Microscopic Comment 1. UTERUS, CARCINOMA OR CARCINOSARCOMA Procedure: Total hysterectomy with bilateral salpingo-oophorectomy. Histologic type: Endometrioid adenocarcinoma. Histologic Grade: FIGO Grade I Myometrial invasion: Depth of invasion: 2 mm Myometrial thickness: 21 mm Uterine Serosa Involvement: Not identified Cervical stromal involvement: Not identified Extent of involvement of other organs: Not applicable Lymphovascular invasion: Not identified Regional Lymph Nodes: Examined: 0 Sentinel 0 Non-sentinel 0 Total Tumor block for ancillary studies: 1H MMR / MSI testing: Pending Pathologic Stage Classification (pTNM, AJCC 8th edition): pT1a, pNX (v4.1.0.0) 2. OVARY or FALLOPIAN TUBE or PRIMARY PERITONEUM: Procedure: Salpingo-oophorectomy Specimen Integrity: Intact Tumor Site: Right ovary Ovarian Surface Involvement (required only if applicable): Focally involved by carcinoma Fallopian Tube Surface Involvement (required only if applicable): Not identified Tumor Size: 12 cm Histologic Type: Endometrioid adenocarcinoma Histologic Grade: Grade II Implants (required for advanced stage serous/seromucinous borderline tumors only): Not applicable Other Tissue/ Organ Involvement: Cul de sac biopsy involved by tumor Largest Extrapelvic Peritoneal Focus (required only if applicable): Not applicable Peritoneal/Ascitic Fluid: Negative for carcinoma (case # [id number]) Treatment Effect (required only for high-grade serous carcinomas): Not applicable Regional Lymph Nodes: No lymph nodes submitted or found Number of Lymph Nodes Examined: 0 Pathologic Stage Classification (pTNM, AJCC 8th Edition): pT2b, pN0 Representative Tumor Block: 2B and 2E 1. Molecular study for microsatellite instability and immunohistochemical stains for MMR-related proteins are pending and will be reported in an  addendum. 2. Immunohistochemical stain show that the ovarian tumor is positive for CK7 and PAX8 (both diffuse), CDX2 (patchy and weak); and negative for CK20. This immunoprofile is consistent with the above diagnosis. Dr. Lyndon Code has reviewed this case and concurs with the above diagnosis. Molecular study for microsatellite instability and immunohistochemical stains for MMR-related proteins are pending and will be reported in an addendum    08/30/2017 Genetic Testing    Patient has genetic testing done for MSI  Results revealed patient has the following mutation(s): loss of [facility] 2    08/30/2017 Surgery    Surgeon: Mart Piggs, MD Pre-operative Diagnosis:  1. Adnexal mass 2. Abnormal uterine bleeding 3. H/o Cecal CA  Post-operative Diagnosis:  1. Adhesive disease post colon resection 2. Endometrial cancer NOS 3. Adenocarcinoma unknown origin, right ovary, suspicious for GI primary  Operation:  1. Lysis of adhesions ~30 minutes 2. Robotic-assisted laparoscopic total hysterectomy with right salpingo-oophorectomy and left salpingectomy 3. Left oophorectomy (RA-laparoscopic) 4. Pelvic washings  Findings: Adhesions of omentum to anterior abdominal wall. Enlarged cystic right ovary ~10cm. Uterus had small nodules on serosa near where right adnexa was intimate with the surface. No obvious intraoperative rupture of cyst, although in 2 areas the wall was thin and one of these areas had some bleeding. Slight scarring of left bladder dome to LUS/cervix. Uterus on frozen section c/w hyperplasia and a small focus of endometrial CA - no myo invasion, <2cm in size. Frozen section on the right adnexa was carcinoma, met from colon or possibly Gyn, favor GI primary, defer to permanent. Left ovary was WNL.      09/15/2017 Cancer Staging    Staging form: Corpus Uteri - Carcinoma and Carcinosarcoma, AJCC 8th Edition - Pathologic: FIGO Stage IA (pT1a, pN0, cM0) - Signed by Heath Lark, MD on 09/15/2017     09/26/2017 Procedure    Successful  placement of a right internal jugular approach power injectable Port-A-Cath. The catheter is ready for immediate use.    11/07/2017 Imaging    1. Since 08/18/2017, similar to slight decrease in size of a pancreatic body/tail junction lesion. Cross modality comparison relative to 09/16/2017 MRI is also grossly similar. 2. No evidence of metastatic disease. 3. Aortic Atherosclerosis (ICD10-I70.0).  4. Left nephrolithiasis.    11/18/2017 Genetic Testing    MSH2 c.2237dupT pathogenic mutation identified in the CancerNext panel.  The CancerNext gene panel offered by [employer] includes sequencing and rearrangement analysis for the following 34 genes:   APC, ATM, BARD1, BMPR1A, BRCA1, BRCA2, BRIP1, CDH1, CDK4, CDKN2A, CHEK2, DICER1, HOXB13, EPCAM, GREM1, MLH1, MRE11A, MSH2, MSH6, MUTYH, NBN, NF1, PALB2, PMS2, POLD1, POLE, PTEN, RAD50, RAD51C, RAD51D, SMAD4, SMARCA4, STK11, and TP53.  The report date is November 18, 2017.  MSH2 [id number] pathogenic mutation identified on somatic testing.  These results are consistent with a diagnosis of Lynch syndrome.     Secondary malignant neoplasm of right ovary ([place])   08/23/2017 Tumor Marker    Patient's tumor was tested for the following markers: CA-125 Results of the tumor marker test revealed 139.8    08/31/2017 Initial Diagnosis    Secondary malignant neoplasm of right ovary ([place])    09/15/2017 Cancer Staging    Staging form: Ovary, Fallopian Tube, and Primary Peritoneal Carcinoma, AJCC 8th Edition - Pathologic: Stage IIB (pT2b, pN0, cM0) - Signed by Heath Lark, MD on 09/15/2017    09/27/2017 Imaging    No evidence of metastatic disease or other acute findings within the thorax.  4 cm low-attenuation mass in pancreatic tail, highly suspicious for pancreatic carcinoma. This is caused splenic vein thrombosis, with new venous collaterals in the left upper quadrant. Consider endoscopic ultrasound with FNA  for tissue diagnosis.  Stable benign hepatic hemangioma.     09/27/2017 Tumor Marker    Patient's tumor was tested for the following markers: CA-125 Results of the tumor marker test revealed 39.4    11/02/2017 Tumor Marker    Patient's tumor was tested for the following markers: CA-125 Results of the tumor marker test revealed 19    11/07/2017 Tumor Marker    Patient's tumor was tested for the following markers: CA-125 Results of the tumor marker test revealed 18.3    11/18/2017 Genetic Testing    MSH2 c.2237dupT pathogenic mutation identified in the CancerNext panel.  The CancerNext gene panel offered by [employer] includes sequencing and rearrangement analysis for the following 34 genes:   APC, ATM, BARD1, BMPR1A, BRCA1, BRCA2, BRIP1, CDH1, CDK4, CDKN2A, CHEK2, DICER1, HOXB13, EPCAM, GREM1, MLH1, MRE11A, MSH2, MSH6, MUTYH, NBN, NF1, PALB2, PMS2, POLD1, POLE, PTEN, RAD50, RAD51C, RAD51D, SMAD4, SMARCA4, STK11, and TP53.  The report date is November 18, 2017.  MSH2 [id number] pathogenic mutation identified on somatic testing.  These results are consistent with a diagnosis of Lynch syndrome.    12/15/2017 Tumor Marker    Patient's tumor was tested for the following markers: CA-125 Results of the tumor marker test revealed 57.3    01/16/2018 Tumor Marker    Patient's tumor was tested for the following markers: CA-125 Results of the tumor marker test revealed 24.2     Pancreatic cancer ([place])   10/13/2017 Pathology Results    Pancreas tail mass, endoscopic ultrasound-guided, fine needle aspiration II (smears and cell block): Adenocarcinoma    11/18/2017 Genetic Testing    MSH2 c.2237dupT pathogenic mutation identified in the CancerNext panel.  The CancerNext  gene panel offered by [employer] includes sequencing and rearrangement analysis for the following 34 genes:   APC, ATM, BARD1, BMPR1A, BRCA1, BRCA2, BRIP1, CDH1, CDK4, CDKN2A, CHEK2, DICER1, HOXB13, EPCAM,  GREM1, MLH1, MRE11A, MSH2, MSH6, MUTYH, NBN, NF1, PALB2, PMS2, POLD1, POLE, PTEN, RAD50, RAD51C, RAD51D, SMAD4, SMARCA4, STK11, and TP53.  The report date is November 18, 2017.  MSH2 c.[id number] pathogenic mutation identified on somatic testing.  These results are consistent with a diagnosis of Lynch syndrome.    11/24/2017 Pathology Results    A. "TAIL OF PANCREAS AND SPLEEN", DISTAL PANCREATECTOMY AND SPLENECTOMY: Invasive ductal adenocarcinoma, moderately to poorly differentiated with focal signet ring cell features, of pancreas (distal).   The carcinoma is 2.5 cm in greatest dimension grossly.   Treatment effects present in the form of fibrosis (50%). No lymphovascular or definite perineural invasion identified. All surgical margins are negative for tumor or high-grade dysplasia. Adjacent mucinous neoplasm, most consistent with Intraductal papillary mucinous neoplasm (IPMN) with low-grade dysplasia.   Uninvolved pancreas show atrophy and focal acute inflammation.   Twelve benign lymph nodes (0/12). Spleen with no significant histopathologic abnormalities.  PROCEDURE: distal pancreatectomy and splenectomy TUMOR SITE: distal pancreas TUMOR SIZE:  GREATEST DIMENSION: 2.5 cm  ADDITIONAL DIMENSIONS:  x  cm HISTOLOGIC TYPE: ductal adenocarcinoma HISTOLOGIC GRADE: grade 3 TUMOR EXTENSION: peripancreatic soft tissue MARGINS: negative for tumor TREATMENT EFFECT: treatment effects present in the form of fibrosis (50%). LYMPHOVASCULAR INVASION: not identified PERINEURAL INVASION: no definite evidence REGIONAL LYMPH NODES:   NUMBER OF LYMPH NODES INVOLVED: 0   NUMBER OF LYMPH NODES EXAMINED: 12 PATHOLOGIC STAGE CLASSIFICATION (pTNM, AJCC 8th Ed): ypT2, ypN0 DISTANT METASTASIS (pM): pMx ADDITIONAL PATHOLOGIC FINDINGS: mucinous neoplasm, most consistent with intraductal papillary mucinous neoplasm (IPMN) with low-grade  dysplasia, is identified adjacent to the main tumor.    11/24/2017 Cancer Staging    Staging form: Exocrine Pancreas, AJCC 8th Edition - Pathologic stage from 11/24/2017: Stage IB (pT2, pN0, cM0) - Signed by Truitt Merle, MD on 01/03/2018    11/25/2017 Surgery    She [facility] 1. Exploratory Laparotomy 2. Distal Pancreatectomy and Splenectomy 3. Intraoperative Ultrasound 4. Open Cholecystectomy     12/15/2017 Cancer Staging    Staging form: Exocrine Pancreas, AJCC 8th Edition - Clinical: Stage IB (cT2, cN0, cM0) - Signed by Heath Lark, MD on 12/15/2017    12/28/2017 Imaging    12/28/2017 CT Abdomen  IMPRESSION: 1. Postoperative findings from recent partial pancreatectomy including a 21 cubic cm fluid collection along the pancreatic resection margin which could represent early pseudocyst. 2. Nodularity along the lateral limb of the left adrenal gland could also be postoperative but merit surveillance, as the pancreatic lesion was in close proximity to this adrenal gland on the prior CT of 11/07/2017. 3. Asymmetric fullness inferiorly in the left breast. The patient has a history of prior left breast procedures, correlation with mammography is recommended. 4. Other imaging findings of potential clinical significance: Stable hemangioma in the left hepatic lobe. Splenectomy. Aortic Atherosclerosis (ICD10-I70.0). Right hemicolectomy. Small focus of fat necrosis in the right anterior abdominal wall subcutaneous tissues near the laparotomy site. Bilateral nonobstructive nephrolithiasis.    01/02/2018 Tumor Marker    Patient's tumor was tested for the following markers: CA-19-9 Results of the tumor marker test revealed 5    01/03/2018 -  Chemotherapy    She received modified dose FOLFIRINOX     REVIEW OF SYSTEMS:   Constitutional: Denies fevers, chills or abnormal weight loss Eyes:  Denies blurriness of vision Ears, nose, mouth, throat, and face: Denies mucositis or sore  throat Respiratory: Denies cough, dyspnea or wheezes Cardiovascular: Denies palpitation, chest discomfort or lower extremity swelling Skin: Denies abnormal skin rashes Lymphatics: Denies new lymphadenopathy or easy bruising Neurological:Denies numbness, tingling or new weaknesses Behavioral/Psych: Mood is stable, no new changes  All other systems were reviewed with the patient and are negative.  I have reviewed the past medical history, past surgical history, social history and family history with the patient and they are unchanged from previous note.  ALLERGIES:  is allergic to codeine; penicillins; and bactrim [sulfamethoxazole-trimethoprim].  MEDICATIONS:  Current Outpatient Medications  Medication Sig Dispense Refill  . acetaminophen (TYLENOL) 650 MG CR tablet Take 1,300 mg by mouth every 8 (eight) hours.    . diclofenac sodium (VOLTAREN) 1 % GEL APPLY 3 GM TO AFFECTED 3 LARGE JOINTS UP TO 3 TIMES A DAY AS NEEDED AS DIRECTED 300 g 1  . dicyclomine (BENTYL) 10 MG capsule Take 1 capsule (10 mg total) by mouth every 8 (eight) hours as needed for spasms. 20 capsule 0  . diphenoxylate-atropine (LOMOTIL) 2.5-0.025 MG tablet Take 1-2 tablets by mouth 4 (four) times daily as needed for diarrhea or loose stools. 1 to 2 po QID prn diarrhea 60 tablet 1  . gabapentin (NEURONTIN) 300 MG capsule Take 1 capsule (300 mg total) by mouth 3 (three) times daily. 90 capsule 1  . HYDROmorphone (DILAUDID) 4 MG tablet Take 1 tablet (4 mg total) by mouth every 4 (four) hours as needed for severe pain. 30 tablet 0  . hydrOXYzine (ATARAX/VISTARIL) 10 MG tablet Take 1 tablet (10 mg total) by mouth 3 (three) times daily as needed for itching. 30 tablet 0  . lidocaine-prilocaine (EMLA) cream Apply to affected area once (Patient taking differently: Apply 1 application topically daily as needed (port). Apply to affected area once) 30 g 3  . loratadine (CLARITIN) 10 MG tablet Take 10 mg by mouth daily after breakfast.     . LORazepam (ATIVAN) 0.5 MG tablet Place 1 tablet (0.5 mg total) under the tongue every 6 (six) hours as needed for anxiety. 30 tablet 1  . meclizine (MEDI-MECLIZINE) 25 MG tablet Take 1 tablet (25 mg total) by mouth 3 (three) times daily as needed for dizziness. 30 tablet 1  . naproxen sodium (ALEVE) 220 MG tablet Take 220 mg by mouth.    Marland Kitchen omeprazole (PRILOSEC) 20 MG capsule Take 20 mg by mouth 2 (two) times daily before a meal.     . ondansetron (ZOFRAN) 8 MG tablet Take 1 tablet (8 mg total) by mouth every 8 (eight) hours as needed for refractory nausea / vomiting. Start on day 3 after chemo. 30 tablet 1  . predniSONE (DELTASONE) 10 MG tablet TAKE 1 TABLET BY MOUTH EVERY DAY WITH BREAKFAST (Patient taking differently: Take 5 mg by mouth daily with breakfast. ) 30 tablet 0  . prochlorperazine (COMPAZINE) 10 MG tablet Take 1 tablet (10 mg total) by mouth every 6 (six) hours as needed (Nausea or vomiting). 30 tablet 1  . simethicone (MYLICON) 885 MG chewable tablet Chew 125 mg by mouth every 6 (six) hours as needed for flatulence.    . traMADol (ULTRAM) 50 MG tablet Take 1-2 tablets (50-100 mg total) by mouth every 6 (six) hours as needed. 90 tablet 0   No current facility-administered medications for this visit.     PHYSICAL EXAMINATION: ECOG PERFORMANCE STATUS: 1 - Symptomatic but completely ambulatory  Vitals:   02/06/18 1018  BP: (!) 135/93  Pulse: 79  Resp: 18  Temp: 98.4 F (36.9 C)  SpO2: 96%   Filed Weights   02/06/18 1018  Weight: 214 lb 6.4 oz (97.3 kg)    GENERAL:alert, no distress and comfortable.  She looks mildly cushingoid SKIN: skin color, texture, turgor are normal, no rashes or significant lesions EYES: normal, Conjunctiva are pink and non-injected, sclera clear OROPHARYNX:no exudate, no erythema and lips, buccal mucosa, and tongue normal  NECK: supple, thyroid normal size, non-tender, without nodularity LYMPH:  no palpable lymphadenopathy in the cervical,  axillary or inguinal LUNGS: clear to auscultation and percussion with normal breathing effort HEART: regular rate & rhythm and no murmurs and no lower extremity edema ABDOMEN:abdomen soft, non-tender and normal bowel sounds Musculoskeletal:no cyanosis of digits and no clubbing  NEURO: alert & oriented x 3 with fluent speech, no focal motor/sensory deficits  LABORATORY DATA:  I have reviewed the data as listed    Component Value Date/Time   NA 143 02/06/2018 1001   K 4.0 02/06/2018 1001   CL 107 02/06/2018 1001   CO2 24 02/06/2018 1001   GLUCOSE 114 (H) 02/06/2018 1001   BUN 13 02/06/2018 1001   CREATININE 0.63 02/06/2018 1001   CREATININE 0.68 07/27/2017 0851   CALCIUM 8.8 (L) 02/06/2018 1001   PROT 6.1 (L) 02/06/2018 1001   ALBUMIN 3.4 (L) 02/06/2018 1001   AST 20 02/06/2018 1001   ALT 46 (H) 02/06/2018 1001   ALKPHOS 93 02/06/2018 1001   BILITOT <0.2 (L) 02/06/2018 1001   GFRNONAA >60 02/06/2018 1001   GFRNONAA 106 07/27/2017 0851   GFRAA >60 02/06/2018 1001   GFRAA 123 07/27/2017 0851    No results found for: SPEP, UPEP  Lab Results  Component Value Date   WBC 19.0 (H) 02/06/2018   NEUTROABS 11.4 (H) 02/06/2018   HGB 10.4 (L) 02/06/2018   HCT 34.5 (L) 02/06/2018   MCV 92.5 02/06/2018   PLT 321 02/06/2018      Chemistry      Component Value Date/Time   NA 143 02/06/2018 1001   K 4.0 02/06/2018 1001   CL 107 02/06/2018 1001   CO2 24 02/06/2018 1001   BUN 13 02/06/2018 1001   CREATININE 0.63 02/06/2018 1001   CREATININE 0.68 07/27/2017 0851      Component Value Date/Time   CALCIUM 8.8 (L) 02/06/2018 1001   ALKPHOS 93 02/06/2018 1001   AST 20 02/06/2018 1001   ALT 46 (H) 02/06/2018 1001   BILITOT <0.2 (L) 02/06/2018 1001       All questions were answered. The patient knows to call the clinic with any problems, questions or concerns. No barriers to learning was detected.  I spent 40 minutes counseling the patient face to face. The total time spent in  the appointment was 55 minutes and more than 50% was on counseling and review of test results  Heath Lark, MD 02/07/2018 8:05 AM

## 2018-02-08 ENCOUNTER — Telehealth: Payer: Self-pay | Admitting: *Deleted

## 2018-02-08 LAB — URINE CULTURE: Culture: NO GROWTH

## 2018-02-08 NOTE — Telephone Encounter (Signed)
Patient called concerned about having her treatment on Saturday following the New Year. She reports she has reactions that require communication with Dr. Alvy Bimler and interventions by Eliseo Squires, [place] during her treatments. Is it possible to have this moved to a weekday so both providers are here? She had significant reactions this last treatment despite dose reduction.

## 2018-02-08 NOTE — Telephone Encounter (Signed)
Telephone call to patient to advise urine results. She mentioned home health referral. Referral was sent. Advised patient if she does not hear from [facility] today to call this office first thing in the morning for follow up.

## 2018-02-08 NOTE — Telephone Encounter (Signed)
pls send scheduling msg to move to following week

## 2018-02-08 NOTE — Telephone Encounter (Signed)
-----   Message from Heath Lark, MD sent at 02/08/2018  8:20 AM EST ----- Regarding: urine culture ok Let her know she has no signs of UTI ----- Message ----- From: Interface, Lab In [place] Sent: 02/07/2018   8:48 AM EST To: Heath Lark, MD

## 2018-02-09 ENCOUNTER — Inpatient Hospital Stay: Payer: BLUE CROSS/BLUE SHIELD

## 2018-02-09 ENCOUNTER — Other Ambulatory Visit: Payer: Self-pay | Admitting: Hematology and Oncology

## 2018-02-09 VITALS — BP 122/84 | HR 92 | Temp 98.2°F | Resp 19

## 2018-02-09 DIAGNOSIS — Z1509 Genetic susceptibility to other malignant neoplasm: Secondary | ICD-10-CM | POA: Diagnosis not present

## 2018-02-09 DIAGNOSIS — C7961 Secondary malignant neoplasm of right ovary: Secondary | ICD-10-CM

## 2018-02-09 DIAGNOSIS — R300 Dysuria: Secondary | ICD-10-CM | POA: Diagnosis not present

## 2018-02-09 DIAGNOSIS — I700 Atherosclerosis of aorta: Secondary | ICD-10-CM | POA: Diagnosis not present

## 2018-02-09 DIAGNOSIS — D509 Iron deficiency anemia, unspecified: Secondary | ICD-10-CM | POA: Diagnosis not present

## 2018-02-09 DIAGNOSIS — M170 Bilateral primary osteoarthritis of knee: Secondary | ICD-10-CM | POA: Diagnosis not present

## 2018-02-09 DIAGNOSIS — R079 Chest pain, unspecified: Secondary | ICD-10-CM | POA: Diagnosis not present

## 2018-02-09 DIAGNOSIS — C541 Malignant neoplasm of endometrium: Secondary | ICD-10-CM | POA: Diagnosis not present

## 2018-02-09 DIAGNOSIS — J00 Acute nasopharyngitis [common cold]: Secondary | ICD-10-CM | POA: Diagnosis not present

## 2018-02-09 DIAGNOSIS — G620 Drug-induced polyneuropathy: Secondary | ICD-10-CM | POA: Diagnosis not present

## 2018-02-09 DIAGNOSIS — R748 Abnormal levels of other serum enzymes: Secondary | ICD-10-CM | POA: Diagnosis not present

## 2018-02-09 DIAGNOSIS — E860 Dehydration: Secondary | ICD-10-CM | POA: Diagnosis not present

## 2018-02-09 DIAGNOSIS — C252 Malignant neoplasm of tail of pancreas: Secondary | ICD-10-CM | POA: Diagnosis not present

## 2018-02-09 DIAGNOSIS — Z5111 Encounter for antineoplastic chemotherapy: Secondary | ICD-10-CM | POA: Diagnosis not present

## 2018-02-09 DIAGNOSIS — M069 Rheumatoid arthritis, unspecified: Secondary | ICD-10-CM | POA: Diagnosis not present

## 2018-02-09 DIAGNOSIS — Z5189 Encounter for other specified aftercare: Secondary | ICD-10-CM | POA: Diagnosis not present

## 2018-02-09 MED ORDER — PEGFILGRASTIM INJECTION 6 MG/0.6ML ~~LOC~~
6.0000 mg | PREFILLED_SYRINGE | Freq: Once | SUBCUTANEOUS | Status: AC
  Administered 2018-02-09: 6 mg via SUBCUTANEOUS

## 2018-02-09 MED ORDER — PROMETHAZINE HCL 25 MG/ML IJ SOLN
25.0000 mg | Freq: Once | INTRAMUSCULAR | Status: AC
  Administered 2018-02-09: 25 mg via INTRAVENOUS

## 2018-02-09 MED ORDER — SODIUM CHLORIDE 0.9 % IV SOLN
Freq: Once | INTRAVENOUS | Status: AC
  Filled 2018-02-09: qty 250

## 2018-02-09 MED ORDER — PEGFILGRASTIM INJECTION 6 MG/0.6ML ~~LOC~~
PREFILLED_SYRINGE | SUBCUTANEOUS | Status: AC
  Filled 2018-02-09: qty 0.6

## 2018-02-09 MED ORDER — SODIUM CHLORIDE 0.9 % IV SOLN
INTRAVENOUS | Status: AC
  Administered 2018-02-09: 15:00:00 via INTRAVENOUS
  Filled 2018-02-09 (×2): qty 250

## 2018-02-09 MED ORDER — SODIUM CHLORIDE 0.9% FLUSH
10.0000 mL | INTRAVENOUS | Status: DC | PRN
  Administered 2018-02-09: 10 mL
  Filled 2018-02-09: qty 10

## 2018-02-09 MED ORDER — HEPARIN SOD (PORK) LOCK FLUSH 100 UNIT/ML IV SOLN
500.0000 [IU] | Freq: Once | INTRAVENOUS | Status: AC | PRN
  Administered 2018-02-09: 500 [IU]
  Filled 2018-02-09: qty 5

## 2018-02-09 MED ORDER — PROMETHAZINE HCL 25 MG/ML IJ SOLN
INTRAMUSCULAR | Status: AC
  Filled 2018-02-09: qty 1

## 2018-02-09 NOTE — Patient Instructions (Signed)
Pegfilgrastim injection What is this medicine? PEGFILGRASTIM (PEG fil gra stim) is a long-acting granulocyte colony-stimulating factor that stimulates the growth of neutrophils, a type of white blood cell important in the body's fight against infection. It is used to reduce the incidence of fever and infection in patients with certain types of cancer who are receiving chemotherapy that affects the bone marrow, and to increase survival after being exposed to high doses of radiation. This medicine may be used for other purposes; ask your health care provider or pharmacist if you have questions. COMMON BRAND NAME(S): Domenic Moras, UDENYCA What should I tell my health care provider before I take this medicine? They need to know if you have any of these conditions: -kidney disease -latex allergy -ongoing radiation therapy -sickle cell disease -skin reactions to acrylic adhesives (On-Body Injector only) -an unusual or allergic reaction to pegfilgrastim, filgrastim, other medicines, foods, dyes, or preservatives -pregnant or trying to get pregnant -breast-feeding How should I use this medicine? This medicine is for injection under the skin. If you get this medicine at home, you will be taught how to prepare and give the pre-filled syringe or how to use the On-body Injector. Refer to the patient Instructions for Use for detailed instructions. Use exactly as directed. Tell your healthcare provider immediately if you suspect that the On-body Injector may not have performed as intended or if you suspect the use of the On-body Injector resulted in a missed or partial dose. It is important that you put your used needles and syringes in a special sharps container. Do not put them in a trash can. If you do not have a sharps container, call your pharmacist or healthcare provider to get one. Talk to your pediatrician regarding the use of this medicine in children. While this drug may be prescribed for  selected conditions, precautions do apply. Overdosage: If you think you have taken too much of this medicine contact a poison control center or emergency room at once. NOTE: This medicine is only for you. Do not share this medicine with others. What if I miss a dose? It is important not to miss your dose. Call your doctor or health care professional if you miss your dose. If you miss a dose due to an On-body Injector failure or leakage, a new dose should be administered as soon as possible using a single prefilled syringe for manual use. What may interact with this medicine? Interactions have not been studied. Give your health care provider a list of all the medicines, herbs, non-prescription drugs, or dietary supplements you use. Also tell them if you smoke, drink alcohol, or use illegal drugs. Some items may interact with your medicine. This list may not describe all possible interactions. Give your health care provider a list of all the medicines, herbs, non-prescription drugs, or dietary supplements you use. Also tell them if you smoke, drink alcohol, or use illegal drugs. Some items may interact with your medicine. What should I watch for while using this medicine? You may need blood work done while you are taking this medicine. If you are going to need a MRI, CT scan, or other procedure, tell your doctor that you are using this medicine (On-Body Injector only). What side effects may I notice from receiving this medicine? Side effects that you should report to your doctor or health care professional as soon as possible: -allergic reactions like skin rash, itching or hives, swelling of the face, lips, or tongue -back pain -dizziness -fever -pain, redness,  or irritation at site where injected -pinpoint red spots on the skin -red or dark-brown urine -shortness of breath or breathing problems -stomach or side pain, or pain at the shoulder -swelling -tiredness -trouble passing urine or  change in the amount of urine Side effects that usually do not require medical attention (report to your doctor or health care professional if they continue or are bothersome): -bone pain -muscle pain This list may not describe all possible side effects. Call your doctor for medical advice about side effects. You may report side effects to FDA at 1-800-FDA-1088. Where should I keep my medicine? Keep out of the reach of children. If you are using this medicine at home, you will be instructed on how to store it. Throw away any unused medicine after the expiration date on the label. NOTE: This sheet is a summary. It may not cover all possible information. If you have questions about this medicine, talk to your doctor, pharmacist, or health care provider.  2019 Elsevier/Gold Standard (2017-05-16 16:57:08)  Dehydration, Adult  Dehydration is when there is not enough fluid or water in your body. This happens when you lose more fluids than you take in. Dehydration can range from mild to very bad. It should be treated right away to keep it from getting very bad. Symptoms of mild dehydration may include:  Thirst.  Dry lips.  Slightly dry mouth.  Dry, warm skin.  Dizziness. Symptoms of moderate dehydration may include:  Very dry mouth.  Muscle cramps.  Dark pee (urine). Pee may be the color of tea.  Your body making less pee.  Your eyes making fewer tears.  Heartbeat that is uneven or faster than normal (palpitations).  Headache.  Light-headedness, especially when you stand up from sitting.  Fainting (syncope). Symptoms of very bad dehydration may include:  Changes in skin, such as: ? Cold and clammy skin. ? Blotchy (mottled) or pale skin. ? Skin that does not quickly return to normal after being lightly pinched and let go (poor skin turgor).  Changes in body fluids, such as: ? Feeling very thirsty. ? Your eyes making fewer tears. ? Not sweating when body temperature is  high, such as in hot weather. ? Your body making very little pee.  Changes in vital signs, such as: ? Weak pulse. ? Pulse that is more than 100 beats a minute when you are sitting still. ? Fast breathing. ? Low blood pressure.  Other changes, such as: ? Sunken eyes. ? Cold hands and feet. ? Confusion. ? Lack of energy (lethargy). ? Trouble waking up from sleep. ? Short-term weight loss. ? Unconsciousness. Follow these instructions at home:   If told by your doctor, drink an ORS: ? Make an ORS by using instructions on the package. ? Start by drinking small amounts, about  cup (120 mL) every 5-10 minutes. ? Slowly drink more until you have had the amount that your doctor said to have.  Drink enough clear fluid to keep your pee clear or pale yellow. If you were told to drink an ORS, finish the ORS first, then start slowly drinking clear fluids. Drink fluids such as: ? Water. Do not drink only water by itself. Doing that can make the salt (sodium) level in your body get too low (hyponatremia). ? Ice chips. ? Fruit juice that you have added water to (diluted). ? Low-calorie sports drinks.  Avoid: ? Alcohol. ? Drinks that have a lot of sugar. These include high-calorie sports drinks, fruit juice   that does not have water added, and soda. ? Caffeine. ? Foods that are greasy or have a lot of fat or sugar.  Take over-the-counter and prescription medicines only as told by your doctor.  Do not take salt tablets. Doing that can make the salt level in your body get too high (hypernatremia).  Eat foods that have minerals (electrolytes). Examples include bananas, oranges, potatoes, tomatoes, and spinach.  Keep all follow-up visits as told by your doctor. This is important. Contact a doctor if:  You have belly (abdominal) pain that: ? Gets worse. ? Stays in one area (localizes).  You have a rash.  You have a stiff neck.  You get angry or annoyed more easily than normal  (irritability).  You are more sleepy than normal.  You have a harder time waking up than normal.  You feel: ? Weak. ? Dizzy. ? Very thirsty.  You have peed (urinated) only a small amount of very dark pee during 6-8 hours. Get help right away if:  You have symptoms of very bad dehydration.  You cannot drink fluids without throwing up (vomiting).  Your symptoms get worse with treatment.  You have a fever.  You have a very bad headache.  You are throwing up or having watery poop (diarrhea) and it: ? Gets worse. ? Does not go away.  You have blood or something green (bile) in your throw-up.  You have blood in your poop (stool). This may cause poop to look black and tarry.  You have not peed in 6-8 hours.  You pass out (faint).  Your heart rate when you are sitting still is more than 100 beats a minute.  You have trouble breathing. This information is not intended to replace advice given to you by your health care provider. Make sure you discuss any questions you have with your health care provider. Document Released: 12/05/2008 Document Revised: 08/29/2015 Document Reviewed: 04/04/2015 Elsevier Interactive Patient Education  2019 Elsevier Inc.  

## 2018-02-10 ENCOUNTER — Telehealth: Payer: Self-pay | Admitting: Hematology and Oncology

## 2018-02-10 DIAGNOSIS — E860 Dehydration: Secondary | ICD-10-CM | POA: Diagnosis not present

## 2018-02-10 DIAGNOSIS — C252 Malignant neoplasm of tail of pancreas: Secondary | ICD-10-CM | POA: Diagnosis not present

## 2018-02-10 NOTE — Telephone Encounter (Signed)
R/s appt per 12/18 sch message- pt is aware of change

## 2018-02-10 NOTE — Progress Notes (Signed)
DATE:  02/07/2018                                           X CHEMO/IMMUNOTHERAPY REACTION              MD: Dr. Heath Lark    AGENT/BLOOD PRODUCT RECEIVING TODAY:              Oxaliplatin and Irinotecan   AGENT/BLOOD PRODUCT RECEIVING IMMEDIATELY PRIOR TO REACTION:         Irinotecan   VS: BP:     148/92   P:       98       SPO2:       97 % on room air T: 98.5                  REACTION(S):            Slurred speech and eye twitching   PREMEDS:      Atropine, Pepcid, Aloxi, and dexamethasone   INTERVENTION: The patient was given Benadryl 25 mg IV x1 and Pepcid 20 mg IV x1.   Review of Systems  Review of Systems  Constitutional: Negative for chills, diaphoresis and fever.  HENT: Positive for voice change (Slurred speech). Negative for trouble swallowing.   Eyes:       Eye twitching  Respiratory: Negative for cough, chest tightness, shortness of breath and wheezing.   Cardiovascular: Negative for chest pain and palpitations.  Gastrointestinal: Negative for abdominal pain, constipation, diarrhea, nausea and vomiting.  Musculoskeletal: Negative for back pain and myalgias.  Neurological: Positive for numbness. Negative for dizziness, light-headedness and headaches.     Physical Exam  Physical Exam Constitutional:      General: She is not in acute distress.    Appearance: She is not diaphoretic.  HENT:     Head: Normocephalic and atraumatic.  Cardiovascular:     Rate and Rhythm: Normal rate and regular rhythm.     Heart sounds: Normal heart sounds. No murmur. No friction rub. No gallop.   Pulmonary:     Effort: Pulmonary effort is normal. No respiratory distress.     Breath sounds: Normal breath sounds. No wheezing or rales.  Skin:    General: Skin is warm and dry.     Findings: No erythema or rash.  Neurological:     Mental Status: She is alert.     OUTCOME:                 Please see the following nurse note below from her visit:  About 10 minutes into  Irinotecan/Leucovorin infusion, pt stated she started feeling her eyes twitching, arms twitching, and like she was having trouble forming her words. Irinotecan/Leucovorin infusion stopped and clamped off, and 1 liter bag of NS hooked up to other PAC connection wide open. Pt stated her nose was running, so 0.5 Atropine administered through NS line at 12:26 (pt wanted to wait and see if she the Atropine was what caused her slurred speech during her last infusion). Alfredia Client notified and came to assess patient. Received orders for 25 mg Benadryl IV. Orders placed and administered at 12:32 through NS line. BP and HR elevated but at pt's baseline from this morning. Pt denied any CP, SOB, or dizziness. Will continue to monitor.   After 15 min from Benadryl, pt asked  to use the bathroom. Stated she felt stable enough to walk. Upon returning stated she was having trouble using her hands and they felt "tingly" (though that might be related to the cold-sensitivity from Oxaliplatin). Updated Lucianne Lei, and received orders for another 20 mg Pepcid IVPB. Orders placed and medication administered at 12:51. Will continue to monitor  At 13:21 pt stated she felt better and was ready to restart. Called Dr. Alvy Bimler with an update and received the OK to resume Irinotecan/Leucovorin infusions and to run at a slower rate. Infusions restarted at 13:35 and pumps programmed to run over 2 hours (changing Irinotecan rate to 233 mL/hr and Leucovorin rate to 134 mL/hr). Will continue to monitor  About an hour into slower infusion, pt's symptoms began returning again. Irinotecan/Leucovorin infusions paused and saline opened wide again at 14:17. Vitals take and stable. Dr. Alvy Bimler notified and received orders for another 0.5mg  Atropine IV. Orders placed and medication administered at 14:35. Updated Dr. Alvy Bimler that symptoms had resolved and received the OK to restart infusions at a slower rate.  IV pump showed 1 hour 14 mins left, changed  "time duration" to 90 mins which changed Irinotecan rate to 191 mL/hr and Leucovorin rate to 110 mL/hr. Infusions restarted at 14:51. Will continue to monitor.    Sandi Mealy, MHS, PA-C

## 2018-02-13 ENCOUNTER — Inpatient Hospital Stay: Payer: BLUE CROSS/BLUE SHIELD

## 2018-02-13 ENCOUNTER — Telehealth: Payer: Self-pay

## 2018-02-13 NOTE — Telephone Encounter (Signed)
She called and left a message to her.  Called back. [facility] nurse has been having trouble getting a blood return thru port. IV fluids are completed and she has been deaccessed now. Offered flush appt for possible cathflo. She declined. She will wait until 1/3 appt.

## 2018-02-14 ENCOUNTER — Inpatient Hospital Stay (HOSPITAL_BASED_OUTPATIENT_CLINIC_OR_DEPARTMENT_OTHER): Payer: BLUE CROSS/BLUE SHIELD | Admitting: Hematology and Oncology

## 2018-02-14 ENCOUNTER — Inpatient Hospital Stay: Payer: BLUE CROSS/BLUE SHIELD

## 2018-02-14 ENCOUNTER — Telehealth: Payer: Self-pay | Admitting: Hematology and Oncology

## 2018-02-14 ENCOUNTER — Ambulatory Visit (HOSPITAL_COMMUNITY)
Admission: RE | Admit: 2018-02-14 | Discharge: 2018-02-14 | Disposition: A | Discharge status: Home / Self Care | Payer: BLUE CROSS/BLUE SHIELD | Source: Ambulatory Visit | Attending: Hematology and Oncology | Admitting: Hematology and Oncology

## 2018-02-14 ENCOUNTER — Encounter: Payer: Self-pay | Admitting: Hematology and Oncology

## 2018-02-14 ENCOUNTER — Telehealth: Payer: Self-pay

## 2018-02-14 ENCOUNTER — Other Ambulatory Visit: Payer: Self-pay | Admitting: Hematology and Oncology

## 2018-02-14 VITALS — BP 151/100 | HR 117 | Temp 98.2°F | Resp 18 | Ht 62.0 in | Wt 211.6 lb

## 2018-02-14 DIAGNOSIS — R748 Abnormal levels of other serum enzymes: Secondary | ICD-10-CM

## 2018-02-14 DIAGNOSIS — C252 Malignant neoplasm of tail of pancreas: Secondary | ICD-10-CM

## 2018-02-14 DIAGNOSIS — D509 Iron deficiency anemia, unspecified: Secondary | ICD-10-CM | POA: Diagnosis not present

## 2018-02-14 DIAGNOSIS — G620 Drug-induced polyneuropathy: Secondary | ICD-10-CM | POA: Diagnosis not present

## 2018-02-14 DIAGNOSIS — D709 Neutropenia, unspecified: Secondary | ICD-10-CM

## 2018-02-14 DIAGNOSIS — C541 Malignant neoplasm of endometrium: Secondary | ICD-10-CM

## 2018-02-14 DIAGNOSIS — J00 Acute nasopharyngitis [common cold]: Secondary | ICD-10-CM

## 2018-02-14 DIAGNOSIS — C7961 Secondary malignant neoplasm of right ovary: Secondary | ICD-10-CM

## 2018-02-14 DIAGNOSIS — Z5189 Encounter for other specified aftercare: Secondary | ICD-10-CM | POA: Diagnosis not present

## 2018-02-14 DIAGNOSIS — Z5111 Encounter for antineoplastic chemotherapy: Secondary | ICD-10-CM | POA: Diagnosis not present

## 2018-02-14 DIAGNOSIS — M170 Bilateral primary osteoarthritis of knee: Secondary | ICD-10-CM | POA: Diagnosis not present

## 2018-02-14 DIAGNOSIS — Z1509 Genetic susceptibility to other malignant neoplasm: Secondary | ICD-10-CM

## 2018-02-14 DIAGNOSIS — R5081 Fever presenting with conditions classified elsewhere: Secondary | ICD-10-CM | POA: Diagnosis not present

## 2018-02-14 DIAGNOSIS — I700 Atherosclerosis of aorta: Secondary | ICD-10-CM | POA: Diagnosis not present

## 2018-02-14 DIAGNOSIS — M069 Rheumatoid arthritis, unspecified: Secondary | ICD-10-CM | POA: Diagnosis not present

## 2018-02-14 DIAGNOSIS — R000 Tachycardia, unspecified: Secondary | ICD-10-CM | POA: Diagnosis not present

## 2018-02-14 DIAGNOSIS — R05 Cough: Secondary | ICD-10-CM | POA: Diagnosis not present

## 2018-02-14 DIAGNOSIS — R079 Chest pain, unspecified: Secondary | ICD-10-CM | POA: Diagnosis not present

## 2018-02-14 DIAGNOSIS — R300 Dysuria: Secondary | ICD-10-CM | POA: Diagnosis not present

## 2018-02-14 DIAGNOSIS — E860 Dehydration: Secondary | ICD-10-CM | POA: Diagnosis not present

## 2018-02-14 LAB — CBC WITH DIFFERENTIAL/PLATELET
Abs Immature Granulocytes: 0.1 10*3/uL — ABNORMAL HIGH (ref 0.00–0.07)
Basophils Absolute: 0 10*3/uL (ref 0.0–0.1)
Basophils Relative: 0 %
Eosinophils Absolute: 0.1 10*3/uL (ref 0.0–0.5)
Eosinophils Relative: 1 %
HCT: 36.4 % (ref 36.0–46.0)
Hemoglobin: 11.2 g/dL — ABNORMAL LOW (ref 12.0–15.0)
Immature Granulocytes: 1 %
Lymphocytes Relative: 10 %
Lymphs Abs: 1.4 10*3/uL (ref 0.7–4.0)
MCH: 28 pg (ref 26.0–34.0)
MCHC: 30.8 g/dL (ref 30.0–36.0)
MCV: 91 fL (ref 80.0–100.0)
Monocytes Absolute: 0.9 10*3/uL (ref 0.1–1.0)
Monocytes Relative: 6 %
Neutro Abs: 12.2 10*3/uL — ABNORMAL HIGH (ref 1.7–7.7)
Neutrophils Relative %: 82 %
Platelets: 242 10*3/uL (ref 150–400)
RBC: 4 MIL/uL (ref 3.87–5.11)
RDW: 20.3 % — ABNORMAL HIGH (ref 11.5–15.5)
WBC: 14.7 10*3/uL — ABNORMAL HIGH (ref 4.0–10.5)
nRBC: 0 % (ref 0.0–0.2)

## 2018-02-14 LAB — COMPREHENSIVE METABOLIC PANEL
ALT: 69 U/L — ABNORMAL HIGH (ref 0–44)
AST: 43 U/L — ABNORMAL HIGH (ref 15–41)
Albumin: 3.4 g/dL — ABNORMAL LOW (ref 3.5–5.0)
Alkaline Phosphatase: 143 U/L — ABNORMAL HIGH (ref 38–126)
Anion gap: 12 (ref 5–15)
BUN: 12 mg/dL (ref 6–20)
CO2: 23 mmol/L (ref 22–32)
Calcium: 8.9 mg/dL (ref 8.9–10.3)
Chloride: 103 mmol/L (ref 98–111)
Creatinine, Ser: 0.7 mg/dL (ref 0.44–1.00)
GFR calc Af Amer: >60 mL/min (ref >60)
GFR calc non Af Amer: >60 mL/min (ref >60)
Glucose, Bld: 163 mg/dL — ABNORMAL HIGH (ref 70–99)
Potassium: 3.8 mmol/L (ref 3.5–5.1)
Sodium: 138 mmol/L (ref 135–145)
Total Bilirubin: <0.2 mg/dL — ABNORMAL LOW (ref 0.3–1.2)
Total Protein: 6.4 g/dL — ABNORMAL LOW (ref 6.5–8.1)

## 2018-02-14 LAB — URINALYSIS, COMPLETE (UACMP) WITH MICROSCOPIC
Bacteria, UA: NONE SEEN
Bilirubin Urine: NEGATIVE
Glucose, UA: NEGATIVE mg/dL
Hgb urine dipstick: NEGATIVE
Ketones, ur: NEGATIVE mg/dL
Leukocytes, UA: NEGATIVE
Nitrite: NEGATIVE
Protein, ur: NEGATIVE mg/dL
Specific Gravity, Urine: 1.014 (ref 1.005–1.030)
pH: 6 (ref 5.0–8.0)

## 2018-02-14 LAB — MAGNESIUM: Magnesium: 1.7 mg/dL (ref 1.7–2.4)

## 2018-02-14 MED ORDER — TRIAMCINOLONE ACETONIDE 55 MCG/ACT NA AERO: 2.0000 | INHALATION_SPRAY | Freq: Every day | NASAL | 12 refills | Status: DC

## 2018-02-14 MED ORDER — DOXYCYCLINE HYCLATE 100 MG PO TABS: 100.0000 mg | ORAL_TABLET | Freq: Two times a day (BID) | ORAL | 0 refills | Status: DC

## 2018-02-14 NOTE — Assessment & Plan Note (Signed)
She has acute tachycardia but does not look septic clinically She could have mild dehydration.  She will continue IV fluid hydration therapy at home as tolerated

## 2018-02-14 NOTE — Progress Notes (Signed)
[facility] OFFICE PROGRESS NOTE  Patient Care Team: [name], MD as PCP - General (Family Medicine)  ASSESSMENT & PLAN:  Acute rhinitis She has symptoms of acute sinusitis without worrisome clinical features of bacterial infection. Nevertheless, she is at high risk of getting infection due to recent chemotherapy and lack of spleen I will prescribe nasal decongestion for her along with antibiotic treatment if she developed worsening symptom I will see her again next week for further follow-up. I will call her once culture results are available  Tachycardia She has acute tachycardia but does not look septic clinically She could have mild dehydration.  She will continue IV fluid hydration therapy at home as tolerated  Elevated liver enzymes She has mildly elevated liver enzymes, likely due to recent antibiotic therapy.  Observe only for now   No orders of the defined types were placed in this encounter.   INTERVAL HISTORY: Please see below for problem oriented charting. 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 Mixed attenuation mass emanates from the right adnexa measuring 10.8 x 8.0 cm very suspicious for right ovarian carcinoma. 2. Abnormality of the tail of the pancreas may be due to mild pancreatitis and small pseudocyst formation, but neoplasm cannot be excluded. 3. Small amount of ascites within abdomen and pelvis. 4. Small nonobstructing renal calculi bilaterally.     08/30/2017 Pathology Results    1. Uterus and cervix, with left fallopian tube - ENDOMETRIOID ADENOCARCINOMA, FIGO GRADE I, ARISING IN A  BACKGROUND OF DIFFUSE COMPLEX ATYPICAL HYPERPLASIA. - CARCINOMA INVADES FOR OF DEPTH OF 0.2 CM WHERE THICKNESS OF MYOMETRIAL WALL IS 2.1 CM. - ALL RESECTION MARGINS ARE NEGATIVE FOR CARCINOMA. - NEGATIVE FOR LYMPHOVASCULAR OR PERINEURAL INVASION. - CERVICAL STROMA IS NOT INVOLVED. - SEE ONCOLOGY TABLE. - SEE NOTE 2. Ovary and fallopian tube, right - PRIMARY OVARIAN ENDOMETRIOID ADENOCARCINOMA, FIGO GRADE II, 12 CM. - THE OVARIAN SURFACE IS FOCALLY INVOLVED BY CARCINOMA. - NEGATIVE FOR LYMPHOVASCULAR INVASION. - BENIGN UNREMARKABLE FALLOPIAN TUBE, NEGATIVE FOR CARCINOMA. - SEE ONCOLOGY TABLE. - SEE NOTE 3. Cul-de-sac biopsy - METASTATIC ADENOCARCINOMA, MOST CONSISTENT WITH PRIMARY OVARIAN ENDOMETRIOID ADENOCARCINOMA. 4. Ovary, left - BENIGN UNREMARKABLE OVARY, NEGATIVE FOR MALIGNANCY. Microscopic Comment 1. UTERUS, CARCINOMA OR CARCINOSARCOMA Procedure: Total hysterectomy with bilateral salpingo-oophorectomy. Histologic type: Endometrioid adenocarcinoma. Histologic Grade: FIGO Grade I Myometrial invasion: Depth of invasion: 2 mm Myometrial thickness: 21 mm Uterine Serosa Involvement: Not identified Cervical stromal involvement: Not identified Extent of involvement of other organs: Not applicable Lymphovascular invasion: Not identified Regional Lymph Nodes: Examined: 0 Sentinel 0 Non-sentinel 0 Total Tumor block for ancillary studies: 1H MMR / MSI testing: Pending Pathologic Stage Classification (pTNM, AJCC 8th edition): pT1a, pNX (v4.1.0.0) 2. OVARY or FALLOPIAN TUBE or PRIMARY PERITONEUM: Procedure: Salpingo-oophorectomy Specimen Integrity: Intact Tumor Site: Right ovary Ovarian Surface Involvement (required only if applicable): Focally involved by carcinoma Fallopian Tube Surface Involvement (required only if applicable): Not identified Tumor Size: 12 cm Histologic Type: Endometrioid adenocarcinoma Histologic Grade: Grade II Implants (required for advanced stage  serous/seromucinous borderline tumors only): Not applicable Other Tissue/ Organ Involvement: Cul de sac biopsy involved by tumor Largest Extrapelvic Peritoneal  Focus (required only if applicable): Not applicable Peritoneal/Ascitic Fluid: Negative for carcinoma (case # [id number]) Treatment Effect (required only for high-grade serous carcinomas): Not applicable Regional Lymph Nodes: No lymph nodes submitted or found Number of Lymph Nodes Examined: 0 Pathologic Stage Classification (pTNM, AJCC 8th Edition): pT2b, pN0 Representative Tumor Block: 2B and 2E 1. Molecular study for microsatellite instability and immunohistochemical stains for MMR-related proteins are pending and will be reported in an addendum. 2. Immunohistochemical stain show that the ovarian tumor is positive for CK7 and PAX8 (both diffuse), CDX2 (patchy and weak); and negative for CK20. This immunoprofile is consistent with the above diagnosis. Dr. Lyndon Code has reviewed this case and concurs with the above diagnosis. Molecular study for microsatellite instability and immunohistochemical stains for MMR-related proteins are pending and will be reported in an addendum    08/30/2017 Genetic Testing    Patient has genetic testing done for MSI  Results revealed patient has the following mutation(s): loss of [employer] 2    08/30/2017 Surgery    Surgeon: Mart Piggs, MD Pre-operative Diagnosis:  1. Adnexal mass 2. Abnormal uterine bleeding 3. H/o Cecal CA  Post-operative Diagnosis:  1. Adhesive disease post colon resection 2. Endometrial cancer NOS 3. Adenocarcinoma unknown origin, right ovary, suspicious for GI primary  Operation:  1. Lysis of adhesions ~30 minutes 2. Robotic-assisted laparoscopic total hysterectomy with right salpingo-oophorectomy and left salpingectomy 3. Left oophorectomy (RA-laparoscopic) 4. Pelvic washings  Findings: Adhesions of omentum to anterior abdominal wall. Enlarged cystic right ovary ~10cm.  Uterus had small nodules on serosa near where right adnexa was intimate with the surface. No obvious intraoperative rupture of cyst, although in 2 areas the wall was thin and one of these areas had some bleeding. Slight scarring of left bladder dome to LUS/cervix. Uterus on frozen section c/w hyperplasia and a small focus of endometrial CA - no myo invasion, <2cm in size. Frozen section on the right adnexa was carcinoma, met from colon or possibly Gyn, favor GI primary, defer to permanent. Left ovary was WNL.      09/15/2017 Cancer Staging    Staging form: Corpus Uteri - Carcinoma and Carcinosarcoma, AJCC 8th Edition - Pathologic: FIGO Stage IA (pT1a, pN0, cM0) - Signed by Heath Lark, MD on 09/15/2017    09/26/2017 Procedure    Successful placement of a right internal jugular approach power injectable Port-A-Cath. The catheter is ready for immediate use.    11/07/2017 Imaging    1. Since 08/18/2017, similar to slight decrease in size of a pancreatic body/tail junction lesion. Cross modality comparison relative to 09/16/2017 MRI is also grossly similar. 2. No evidence of metastatic disease. 3. Aortic Atherosclerosis (ICD10-I70.0).  4. Left nephrolithiasis.    11/18/2017 Genetic Testing    MSH2 c.2237dupT pathogenic mutation identified in the CancerNext panel.  The CancerNext gene panel offered by [employer] includes sequencing and rearrangement analysis for the following 34 genes:   APC, ATM, BARD1, BMPR1A, BRCA1, BRCA2, BRIP1, CDH1, CDK4, CDKN2A, CHEK2, DICER1, HOXB13, EPCAM, GREM1, MLH1, MRE11A, MSH2, MSH6, MUTYH, NBN, NF1, PALB2, PMS2, POLD1, POLE, PTEN, RAD50, RAD51C, RAD51D, SMAD4, SMARCA4, STK11, and TP53.  The report date is November 18, 2017.  MSH2 [id number] pathogenic mutation identified on somatic testing.  These results are consistent with a diagnosis of Lynch syndrome.     Secondary malignant neoplasm of right ovary ([place])   08/23/2017 Tumor Marker    Patient's tumor  was tested for the following markers: CA-125 Results of the tumor marker test  revealed 139.8    08/31/2017 Initial Diagnosis    Secondary malignant neoplasm of right ovary ([place])    09/15/2017 Cancer Staging    Staging form: Ovary, Fallopian Tube, and Primary Peritoneal Carcinoma, AJCC 8th Edition - Pathologic: Stage IIB (pT2b, pN0, cM0) - Signed by Heath Lark, MD on 09/15/2017    09/27/2017 Imaging    No evidence of metastatic disease or other acute findings within the thorax.  4 cm low-attenuation mass in pancreatic tail, highly suspicious for pancreatic carcinoma. This is caused splenic vein thrombosis, with new venous collaterals in the left upper quadrant. Consider endoscopic ultrasound with FNA for tissue diagnosis.  Stable benign hepatic hemangioma.     09/27/2017 Tumor Marker    Patient's tumor was tested for the following markers: CA-125 Results of the tumor marker test revealed 39.4    11/02/2017 Tumor Marker    Patient's tumor was tested for the following markers: CA-125 Results of the tumor marker test revealed 19    11/07/2017 Tumor Marker    Patient's tumor was tested for the following markers: CA-125 Results of the tumor marker test revealed 18.3    11/18/2017 Genetic Testing    MSH2 c.2237dupT pathogenic mutation identified in the CancerNext panel.  The CancerNext gene panel offered by [employer] includes sequencing and rearrangement analysis for the following 34 genes:   APC, ATM, BARD1, BMPR1A, BRCA1, BRCA2, BRIP1, CDH1, CDK4, CDKN2A, CHEK2, DICER1, HOXB13, EPCAM, GREM1, MLH1, MRE11A, MSH2, MSH6, MUTYH, NBN, NF1, PALB2, PMS2, POLD1, POLE, PTEN, RAD50, RAD51C, RAD51D, SMAD4, SMARCA4, STK11, and TP53.  The report date is November 18, 2017.  MSH2 [id number] pathogenic mutation identified on somatic testing.  These results are consistent with a diagnosis of Lynch syndrome.    12/15/2017 Tumor Marker    Patient's tumor was tested for the following markers:  CA-125 Results of the tumor marker test revealed 57.3    01/16/2018 Tumor Marker    Patient's tumor was tested for the following markers: CA-125 Results of the tumor marker test revealed 24.2     Pancreatic cancer ([place])   10/13/2017 Pathology Results    Pancreas tail mass, endoscopic ultrasound-guided, fine needle aspiration II (smears and cell block): Adenocarcinoma    11/18/2017 Genetic Testing    MSH2 c.2237dupT pathogenic mutation identified in the CancerNext panel.  The CancerNext gene panel offered by [employer] includes sequencing and rearrangement analysis for the following 34 genes:   APC, ATM, BARD1, BMPR1A, BRCA1, BRCA2, BRIP1, CDH1, CDK4, CDKN2A, CHEK2, DICER1, HOXB13, EPCAM, GREM1, MLH1, MRE11A, MSH2, MSH6, MUTYH, NBN, NF1, PALB2, PMS2, POLD1, POLE, PTEN, RAD50, RAD51C, RAD51D, SMAD4, SMARCA4, STK11, and TP53.  The report date is November 18, 2017.  MSH2 [id number] pathogenic mutation identified on somatic testing.  These results are consistent with a diagnosis of Lynch syndrome.    11/24/2017 Pathology Results    A. "TAIL OF PANCREAS AND SPLEEN", DISTAL PANCREATECTOMY AND SPLENECTOMY: Invasive ductal adenocarcinoma, moderately to poorly differentiated with focal signet ring cell features, of pancreas (distal).   The carcinoma is 2.5 cm in greatest dimension grossly.   Treatment effects present in the form of fibrosis (50%). No lymphovascular or definite perineural invasion identified. All surgical margins are negative for tumor or high-grade dysplasia. Adjacent mucinous neoplasm, most consistent with Intraductal papillary mucinous neoplasm (IPMN) with low-grade dysplasia.   Uninvolved pancreas show atrophy and focal acute inflammation.   Twelve benign lymph nodes (0/12). Spleen with no significant histopathologic abnormalities.  PROCEDURE: distal pancreatectomy and splenectomy TUMOR SITE: distal  pancreas TUMOR SIZE:  GREATEST DIMENSION: 2.5 cm  ADDITIONAL DIMENSIONS:  x  cm HISTOLOGIC TYPE: ductal adenocarcinoma HISTOLOGIC GRADE: grade 3 TUMOR EXTENSION: peripancreatic soft tissue MARGINS: negative for tumor TREATMENT EFFECT: treatment effects present in the form of fibrosis (50%). LYMPHOVASCULAR INVASION: not identified PERINEURAL INVASION: no definite evidence REGIONAL LYMPH NODES:   NUMBER OF LYMPH NODES INVOLVED: 0   NUMBER OF LYMPH NODES EXAMINED: 12 PATHOLOGIC STAGE CLASSIFICATION (pTNM, AJCC 8th Ed): ypT2, ypN0 DISTANT METASTASIS (pM): pMx ADDITIONAL PATHOLOGIC FINDINGS: mucinous neoplasm, most consistent with intraductal papillary mucinous neoplasm (IPMN) with low-grade dysplasia, is identified adjacent to the main tumor.    11/24/2017 Cancer Staging    Staging form: Exocrine Pancreas, AJCC 8th Edition - Pathologic stage from 11/24/2017: Stage IB (pT2, pN0, cM0) - Signed by Truitt Merle, MD on 01/03/2018    11/25/2017 Surgery    She had [facility] 1. Exploratory Laparotomy 2. Distal Pancreatectomy and Splenectomy 3. Intraoperative Ultrasound 4. Open Cholecystectomy     12/15/2017 Cancer Staging    Staging form: Exocrine Pancreas, AJCC 8th Edition - Clinical: Stage IB (cT2, cN0, cM0) - Signed by Heath Lark, MD on 12/15/2017    12/28/2017 Imaging    12/28/2017 CT Abdomen  IMPRESSION: 1. Postoperative findings from recent partial pancreatectomy including a 21 cubic cm fluid collection along the pancreatic resection margin which could represent early pseudocyst. 2. Nodularity along the lateral limb of the left adrenal gland could also be postoperative but merit surveillance, as the pancreatic lesion was in close proximity to this adrenal gland on the prior CT of 11/07/2017. 3. Asymmetric fullness inferiorly in the left breast. The patient has a history of prior left breast procedures, correlation with mammography is recommended. 4. Other imaging  findings of potential clinical significance: Stable hemangioma in the left hepatic lobe. Splenectomy. Aortic Atherosclerosis (ICD10-I70.0). Right hemicolectomy. Small focus of fat necrosis in the right anterior abdominal wall subcutaneous tissues near the laparotomy site. Bilateral nonobstructive nephrolithiasis.    01/02/2018 Tumor Marker    Patient's tumor was tested for the following markers: CA-19-9 Results of the tumor marker test revealed 5    01/03/2018 -  Chemotherapy    She received modified dose FOLFIRINOX     REVIEW OF SYSTEMS:   Constitutional: Denies fevers, chills or abnormal weight loss Eyes: Denies blurriness of vision Ears, nose, mouth, throat, and face: Denies mucositis or sore throat Respiratory: Denies cough, dyspnea or wheezes Cardiovascular: Denies palpitation, chest discomfort or lower extremity swelling Gastrointestinal:  Denies nausea, heartburn or change in bowel habits Skin: Denies abnormal skin rashes Lymphatics: Denies new lymphadenopathy or easy bruising Neurological:Denies numbness, tingling or new weaknesses Behavioral/Psych: Mood is stable, no new changes  All other systems were reviewed with the patient and are negative.  I have reviewed the past medical history, past surgical history, social history and family history with the patient and they are unchanged from previous note.  ALLERGIES:  is allergic to codeine; penicillins; and bactrim [sulfamethoxazole-trimethoprim].  MEDICATIONS:  Current Outpatient Medications  Medication Sig Dispense Refill  . acetaminophen (TYLENOL) 650 MG CR tablet Take 1,300 mg by mouth every 8 (eight) hours.    . diclofenac sodium (VOLTAREN) 1 % GEL APPLY 3 GM TO AFFECTED 3 LARGE JOINTS UP TO 3 TIMES A DAY AS NEEDED AS DIRECTED 300 g 1  . dicyclomine (BENTYL) 10 MG capsule Take 1 capsule (10 mg total) by mouth every 8 (eight) hours as needed for spasms. 20 capsule 0  . diphenoxylate-atropine (  LOMOTIL) 2.5-0.025 MG tablet  Take 1-2 tablets by mouth 4 (four) times daily as needed for diarrhea or loose stools. 1 to 2 po QID prn diarrhea 60 tablet 1  . doxycycline (VIBRA-TABS) 100 MG tablet Take 1 tablet (100 mg total) by mouth 2 (two) times daily. 14 tablet 0  . gabapentin (NEURONTIN) 300 MG capsule Take 1 capsule (300 mg total) by mouth 3 (three) times daily. 90 capsule 1  . HYDROmorphone (DILAUDID) 4 MG tablet Take 1 tablet (4 mg total) by mouth every 4 (four) hours as needed for severe pain. 30 tablet 0  . hydrOXYzine (ATARAX/VISTARIL) 10 MG tablet Take 1 tablet (10 mg total) by mouth 3 (three) times daily as needed for itching. 30 tablet 0  . lidocaine-prilocaine (EMLA) cream Apply to affected area once (Patient taking differently: Apply 1 application topically daily as needed (port). Apply to affected area once) 30 g 3  . loratadine (CLARITIN) 10 MG tablet Take 10 mg by mouth daily after breakfast.    . LORazepam (ATIVAN) 0.5 MG tablet Place 1 tablet (0.5 mg total) under the tongue every 6 (six) hours as needed for anxiety. 30 tablet 1  . meclizine (MEDI-MECLIZINE) 25 MG tablet Take 1 tablet (25 mg total) by mouth 3 (three) times daily as needed for dizziness. 30 tablet 1  . naproxen sodium (ALEVE) 220 MG tablet Take 220 mg by mouth.    Marland Kitchen omeprazole (PRILOSEC) 20 MG capsule Take 20 mg by mouth 2 (two) times daily before a meal.     . ondansetron (ZOFRAN) 8 MG tablet Take 1 tablet (8 mg total) by mouth every 8 (eight) hours as needed for refractory nausea / vomiting. Start on day 3 after chemo. 30 tablet 1  . predniSONE (DELTASONE) 10 MG tablet TAKE 1 TABLET BY MOUTH EVERY DAY WITH BREAKFAST (Patient taking differently: Take 5 mg by mouth daily with breakfast. ) 30 tablet 0  . prochlorperazine (COMPAZINE) 10 MG tablet Take 1 tablet (10 mg total) by mouth every 6 (six) hours as needed (Nausea or vomiting). 30 tablet 1  . simethicone (MYLICON) 016 MG chewable tablet Chew 125 mg by mouth every 6 (six) hours as needed for  flatulence.    . traMADol (ULTRAM) 50 MG tablet Take 1-2 tablets (50-100 mg total) by mouth every 6 (six) hours as needed. 90 tablet 0  . triamcinolone (NASACORT) 55 MCG/ACT AERO nasal inhaler Place 2 sprays into the nose daily. 1 Inhaler 12   No current facility-administered medications for this visit.     PHYSICAL EXAMINATION: ECOG PERFORMANCE STATUS: 1 - Symptomatic but completely ambulatory  Vitals:   02/14/18 1107  BP: (!) 151/100  Pulse: (!) 117  Resp: 18  Temp: 98.2 F (36.8 C)  SpO2: 97%   Filed Weights   02/14/18 1107  Weight: 211 lb 9.6 oz (96 kg)    GENERAL:alert, no distress and comfortable SKIN: skin color, texture, turgor are normal, no rashes or significant lesions EYES: normal, Conjunctiva are pink and non-injected, sclera clear OROPHARYNX:no exudate, no erythema and lips, buccal mucosa, and tongue normal  NECK: supple, thyroid normal size, non-tender, without nodularity LYMPH:  no palpable lymphadenopathy in the cervical, axillary or inguinal LUNGS: clear to auscultation and percussion with normal breathing effort HEART: regular rate & rhythm and no murmurs and no lower extremity edema ABDOMEN:abdomen soft, non-tender and normal bowel sounds Musculoskeletal:no cyanosis of digits and no clubbing  NEURO: alert & oriented x 3 with fluent speech, no focal motor/sensory  deficits  LABORATORY DATA:  I have reviewed the data as listed    Component Value Date/Time   NA 138 02/14/2018 1032   K 3.8 02/14/2018 1032   CL 103 02/14/2018 1032   CO2 23 02/14/2018 1032   GLUCOSE 163 (H) 02/14/2018 1032   BUN 12 02/14/2018 1032   CREATININE 0.70 02/14/2018 1032   CREATININE 0.63 02/06/2018 1001   CREATININE 0.68 07/27/2017 0851   CALCIUM 8.9 02/14/2018 1032   PROT 6.4 (L) 02/14/2018 1032   ALBUMIN 3.4 (L) 02/14/2018 1032   AST 43 (H) 02/14/2018 1032   AST 20 02/06/2018 1001   ALT 69 (H) 02/14/2018 1032   ALT 46 (H) 02/06/2018 1001   ALKPHOS 143 (H) 02/14/2018  1032   BILITOT <0.2 (L) 02/14/2018 1032   BILITOT <0.2 (L) 02/06/2018 1001   GFRNONAA >60 02/14/2018 1032   GFRNONAA >60 02/06/2018 1001   GFRNONAA 106 07/27/2017 0851   GFRAA >60 02/14/2018 1032   GFRAA >60 02/06/2018 1001   GFRAA 123 07/27/2017 0851    No results found for: SPEP, UPEP  Lab Results  Component Value Date   WBC 14.7 (H) 02/14/2018   NEUTROABS 12.2 (H) 02/14/2018   HGB 11.2 (L) 02/14/2018   HCT 36.4 02/14/2018   MCV 91.0 02/14/2018   PLT 242 02/14/2018      Chemistry      Component Value Date/Time   NA 138 02/14/2018 1032   K 3.8 02/14/2018 1032   CL 103 02/14/2018 1032   CO2 23 02/14/2018 1032   BUN 12 02/14/2018 1032   CREATININE 0.70 02/14/2018 1032   CREATININE 0.63 02/06/2018 1001   CREATININE 0.68 07/27/2017 0851      Component Value Date/Time   CALCIUM 8.9 02/14/2018 1032   ALKPHOS 143 (H) 02/14/2018 1032   AST 43 (H) 02/14/2018 1032   AST 20 02/06/2018 1001   ALT 69 (H) 02/14/2018 1032   ALT 46 (H) 02/06/2018 1001   BILITOT <0.2 (L) 02/14/2018 1032   BILITOT <0.2 (L) 02/06/2018 1001       RADIOGRAPHIC STUDIES: I have personally reviewed the radiological images as listed and agreed with the findings in the report. Dg Chest 2 View  Result Date: 02/14/2018 CLINICAL DATA:  History of pancreatic carcinoma.  Cough EXAM: CHEST - 2 VIEW COMPARISON:  November 02, 2017 FINDINGS: Port-A-Cath tip is at the cavoatrial junction. No pneumothorax. The lungs are clear. The heart size and pulmonary vascularity are normal. No adenopathy. No bone lesions. IMPRESSION: No edema or consolidation.  No neoplastic focus demonstrable. Electronically Signed   By: Lowella Grip III M.D.   On: 02/14/2018 10:15    All questions were answered. The patient knows to call the clinic with any problems, questions or concerns. No barriers to learning was detected.  I spent 15 minutes counseling the patient face to face. The total time spent in the appointment was 20  minutes and more than 50% was on counseling and review of test results  Heath Lark, MD 02/14/2018 12:12 PM

## 2018-02-14 NOTE — Assessment & Plan Note (Signed)
She has symptoms of acute sinusitis without worrisome clinical features of bacterial infection. Nevertheless, she is at high risk of getting infection due to recent chemotherapy and lack of spleen I will prescribe nasal decongestion for her along with antibiotic treatment if she developed worsening symptom I will see her again next week for further follow-up. I will call her once culture results are available

## 2018-02-14 NOTE — Assessment & Plan Note (Signed)
She has mildly elevated liver enzymes, likely due to recent antibiotic therapy.  Observe only for now

## 2018-02-14 NOTE — Telephone Encounter (Signed)
She called and left a message to call her. She woke up this morning with a scratchy throat, sinus drainage and low grade temperature. She is requesting appt.

## 2018-02-14 NOTE — Telephone Encounter (Signed)
Per 12/24 no new orders

## 2018-02-14 NOTE — Telephone Encounter (Signed)
Called back per Dr. Alvy Bimler. Instructed to go radiology at [facility] for a chest xray. Then check in out front for labs and appt with Dr. Alvy Bimler.

## 2018-02-14 NOTE — Telephone Encounter (Signed)
Called and left a message for her to call the office.

## 2018-02-15 LAB — URINE CULTURE: Culture: NO GROWTH

## 2018-02-16 ENCOUNTER — Telehealth: Payer: Self-pay | Admitting: *Deleted

## 2018-02-16 NOTE — Telephone Encounter (Signed)
Telephone call to patient to notify cultures were negative. Patient reports she is feeling better. The cold symptoms have improved in her head but she has started coughing. She has added Musinex and it has helped. She will continue abx until finished. She understands to call with any further concerns or questions. She confirms appts next week.

## 2018-02-17 ENCOUNTER — Telehealth: Payer: Self-pay | Admitting: *Deleted

## 2018-02-17 NOTE — Telephone Encounter (Signed)
Notified of message below

## 2018-02-17 NOTE — Telephone Encounter (Signed)
Cash left a message stating her temperature this morning was 101.6 as the tylenol had worn off. She took tylenol and it is down to 99.8. Wants to know if she is OK to take sudafed

## 2018-02-17 NOTE — Telephone Encounter (Signed)
OK to take sudafed and the prescription antibiotics that I gave her

## 2018-02-19 LAB — CULTURE, BLOOD (SINGLE): Culture: NO GROWTH

## 2018-02-21 ENCOUNTER — Ambulatory Visit: Payer: BLUE CROSS/BLUE SHIELD | Admitting: Hematology and Oncology

## 2018-02-21 ENCOUNTER — Telehealth: Payer: Self-pay | Admitting: *Deleted

## 2018-02-21 ENCOUNTER — Other Ambulatory Visit: Payer: BLUE CROSS/BLUE SHIELD

## 2018-02-21 NOTE — Telephone Encounter (Signed)
Patient called with concerns of having a UTI last week. Advised patient labs from 12/24 were negative. Patient reports an on going vaginal discomfort that becomes very intense with coughing. She reports she has had this for a while but it seems to be more constant now. She has been taking ES Tylenol. She described the pain as a dull ache all across her tailbone area. She is very concerned about bladder prolapse. She would like to follow up with Dr. Gerarda Fraction sooner than Feb if possible. She has an appointment with Dr Alvy Bimler on Friday this week.   Patient does not feel doing anything right now is necessary, she understands to go to the ED if pain increases or any signs of bleeding. Patient declines imagining at this time until she can speak to Dr. Alvy Bimler or Dr. Gerarda Fraction.   Forwarding message to Dr. Alvy Bimler and Joylene John, NP for review.

## 2018-02-23 NOTE — Telephone Encounter (Signed)
She has Dilaudid to take for pain if needed. I will talk to her tomorrow and see what needs to be done

## 2018-02-24 ENCOUNTER — Inpatient Hospital Stay (HOSPITAL_BASED_OUTPATIENT_CLINIC_OR_DEPARTMENT_OTHER): Payer: BLUE CROSS/BLUE SHIELD | Admitting: Hematology and Oncology

## 2018-02-24 ENCOUNTER — Inpatient Hospital Stay: Payer: BLUE CROSS/BLUE SHIELD | Attending: Hematology and Oncology

## 2018-02-24 ENCOUNTER — Inpatient Hospital Stay: Payer: BLUE CROSS/BLUE SHIELD

## 2018-02-24 ENCOUNTER — Encounter: Payer: Self-pay | Admitting: Hematology and Oncology

## 2018-02-24 DIAGNOSIS — Z1509 Genetic susceptibility to other malignant neoplasm: Secondary | ICD-10-CM | POA: Insufficient documentation

## 2018-02-24 DIAGNOSIS — C541 Malignant neoplasm of endometrium: Secondary | ICD-10-CM | POA: Insufficient documentation

## 2018-02-24 DIAGNOSIS — R197 Diarrhea, unspecified: Secondary | ICD-10-CM

## 2018-02-24 DIAGNOSIS — J00 Acute nasopharyngitis [common cold]: Secondary | ICD-10-CM | POA: Diagnosis not present

## 2018-02-24 DIAGNOSIS — R110 Nausea: Secondary | ICD-10-CM

## 2018-02-24 DIAGNOSIS — G620 Drug-induced polyneuropathy: Secondary | ICD-10-CM | POA: Insufficient documentation

## 2018-02-24 DIAGNOSIS — R748 Abnormal levels of other serum enzymes: Secondary | ICD-10-CM | POA: Insufficient documentation

## 2018-02-24 DIAGNOSIS — C7961 Secondary malignant neoplasm of right ovary: Secondary | ICD-10-CM

## 2018-02-24 DIAGNOSIS — C252 Malignant neoplasm of tail of pancreas: Secondary | ICD-10-CM | POA: Insufficient documentation

## 2018-02-24 DIAGNOSIS — Z9081 Acquired absence of spleen: Secondary | ICD-10-CM

## 2018-02-24 DIAGNOSIS — Z79899 Other long term (current) drug therapy: Secondary | ICD-10-CM | POA: Diagnosis not present

## 2018-02-24 DIAGNOSIS — R112 Nausea with vomiting, unspecified: Secondary | ICD-10-CM | POA: Diagnosis not present

## 2018-02-24 DIAGNOSIS — E099 Drug or chemical induced diabetes mellitus without complications: Secondary | ICD-10-CM | POA: Diagnosis not present

## 2018-02-24 DIAGNOSIS — Z5111 Encounter for antineoplastic chemotherapy: Secondary | ICD-10-CM | POA: Insufficient documentation

## 2018-02-24 DIAGNOSIS — T451X5A Adverse effect of antineoplastic and immunosuppressive drugs, initial encounter: Secondary | ICD-10-CM

## 2018-02-24 DIAGNOSIS — M170 Bilateral primary osteoarthritis of knee: Secondary | ICD-10-CM

## 2018-02-24 LAB — CBC WITH DIFFERENTIAL/PLATELET
Abs Immature Granulocytes: 0.88 10*3/uL — ABNORMAL HIGH (ref 0.00–0.07)
Basophils Absolute: 0.1 10*3/uL (ref 0.0–0.1)
Basophils Relative: 1 %
Eosinophils Absolute: 0.1 10*3/uL (ref 0.0–0.5)
Eosinophils Relative: 0 %
HCT: 38 % (ref 36.0–46.0)
Hemoglobin: 11.7 g/dL — ABNORMAL LOW (ref 12.0–15.0)
Immature Granulocytes: 4 %
Lymphocytes Relative: 12 %
Lymphs Abs: 2.5 10*3/uL (ref 0.7–4.0)
MCH: 28.4 pg (ref 26.0–34.0)
MCHC: 30.8 g/dL (ref 30.0–36.0)
MCV: 92.2 fL (ref 80.0–100.0)
Monocytes Absolute: 1.3 10*3/uL — ABNORMAL HIGH (ref 0.1–1.0)
Monocytes Relative: 6 %
Neutro Abs: 16.5 10*3/uL — ABNORMAL HIGH (ref 1.7–7.7)
Neutrophils Relative %: 77 %
Platelets: 585 10*3/uL — ABNORMAL HIGH (ref 150–400)
RBC: 4.12 MIL/uL (ref 3.87–5.11)
RDW: 22.8 % — ABNORMAL HIGH (ref 11.5–15.5)
WBC: 21.4 10*3/uL — ABNORMAL HIGH (ref 4.0–10.5)
nRBC: 0.3 % — ABNORMAL HIGH (ref 0.0–0.2)

## 2018-02-24 LAB — COMPREHENSIVE METABOLIC PANEL
ALT: 63 U/L — ABNORMAL HIGH (ref 0–44)
AST: 31 U/L (ref 15–41)
Albumin: 3.3 g/dL — ABNORMAL LOW (ref 3.5–5.0)
Alkaline Phosphatase: 98 U/L (ref 38–126)
Anion gap: 11 (ref 5–15)
BUN: 13 mg/dL (ref 6–20)
CO2: 24 mmol/L (ref 22–32)
Calcium: 9.2 mg/dL (ref 8.9–10.3)
Chloride: 105 mmol/L (ref 98–111)
Creatinine, Ser: 0.67 mg/dL (ref 0.44–1.00)
GFR calc Af Amer: >60 mL/min (ref >60)
GFR calc non Af Amer: >60 mL/min (ref >60)
Glucose, Bld: 146 mg/dL — ABNORMAL HIGH (ref 70–99)
Potassium: 4.3 mmol/L (ref 3.5–5.1)
Sodium: 140 mmol/L (ref 135–145)
Total Bilirubin: <0.2 mg/dL — ABNORMAL LOW (ref 0.3–1.2)
Total Protein: 6.6 g/dL (ref 6.5–8.1)

## 2018-02-24 LAB — MAGNESIUM: Magnesium: 2 mg/dL (ref 1.7–2.4)

## 2018-02-24 MED ORDER — LORAZEPAM 0.5 MG PO TABS: 0.5000 mg | ORAL_TABLET | Freq: Four times a day (QID) | ORAL | 1 refills | Status: DC | PRN

## 2018-02-24 MED ORDER — DIPHENOXYLATE-ATROPINE 2.5-0.025 MG PO TABS: 1.0000 | ORAL_TABLET | Freq: Four times a day (QID) | ORAL | 1 refills | Status: DC | PRN

## 2018-02-24 MED ORDER — SODIUM CHLORIDE 0.9% FLUSH
10.0000 mL | Freq: Once | INTRAVENOUS | Status: AC
  Administered 2018-02-24: 10 mL
  Filled 2018-02-24: qty 10

## 2018-02-24 MED ORDER — HEPARIN SOD (PORK) LOCK FLUSH 100 UNIT/ML IV SOLN
500.0000 [IU] | Freq: Once | INTRAVENOUS | Status: AC
  Administered 2018-02-24: 500 [IU]
  Filled 2018-02-24: qty 5

## 2018-02-24 MED ORDER — PROCHLORPERAZINE MALEATE 10 MG PO TABS: 10.0000 mg | ORAL_TABLET | Freq: Four times a day (QID) | ORAL | 1 refills | Status: DC | PRN

## 2018-02-24 NOTE — Assessment & Plan Note (Signed)
She has received vaccination postsplenectomy.  Her platelet count is likely reflective of postoperative changes.  White blood cell count is also elevated likely due to splenectomy and recent G-CSF support.

## 2018-02-24 NOTE — Progress Notes (Signed)
[facility] OFFICE PROGRESS NOTE  Patient Care Team: [name], MD as PCP - General (Family Medicine)  ASSESSMENT & PLAN:  Pancreatic cancer [facility]) She tolerated treatment poorly with major side effects including pancytopenia, severe nausea, diarrhea, pancytopenia and significant pain With recent reduced dose irinotecan and additional premedication, she tolerated last cycle of therapy better She is also doing better with home IV infusion along with IV antiemetics I plan to reduce premedication oral dexamethasone and only to use it after treatment for nausea I plan to repeat imaging study in February for objective assessment of response to therapy  Secondary malignant neoplasm of right ovary ([place]) She has received 3 cycles of adjuvant chemotherapy.  This is placed on hold while she is undergoing treatment for pancreatic cancer. Her last tumor marker was satisfactory  Chemotherapy-induced nausea Her symptoms are better controlled with regular IV fluids, IV antiemetics and oral antiemetics as needed.  We will continue the same  Osteoarthritis of both knees Her chronic arthritis pain is stable with steroid treatment along with pain medicine as needed.  She will continue the same  Peripheral neuropathy due to chemotherapy [facility]) She denies worsening neuropathy on treatment.  She will continue chronic pain management I recommend she continues gabapentin as tolerated  S/P splenectomy She has received vaccination postsplenectomy.  Her platelet count is likely reflective of postoperative changes.  White blood cell count is also elevated likely due to splenectomy and recent G-CSF support.   No orders of the defined types were placed in this encounter.   INTERVAL HISTORY: Please see below for problem oriented charting. She returns for further follow-up She has significant concern recently due to severe lower back pain This is improved. 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 Mixed attenuation mass emanates from the right adnexa measuring 10.8 x 8.0 cm very suspicious for right ovarian carcinoma. 2. Abnormality of the tail of the pancreas may be due to mild pancreatitis and small pseudocyst formation, but neoplasm cannot be excluded. 3. Small amount of ascites within abdomen and pelvis. 4. Small nonobstructing renal calculi bilaterally.     08/30/2017 Pathology Results    1. Uterus and cervix, with left fallopian tube - ENDOMETRIOID ADENOCARCINOMA, FIGO GRADE I, ARISING IN A BACKGROUND OF DIFFUSE COMPLEX ATYPICAL HYPERPLASIA. - CARCINOMA INVADES FOR OF DEPTH OF 0.2 CM WHERE THICKNESS OF MYOMETRIAL WALL IS 2.1 CM. - ALL RESECTION MARGINS ARE NEGATIVE FOR CARCINOMA. - NEGATIVE FOR LYMPHOVASCULAR OR PERINEURAL INVASION. - CERVICAL STROMA IS NOT INVOLVED. - SEE ONCOLOGY TABLE. - SEE NOTE 2. Ovary and fallopian tube, right - PRIMARY OVARIAN ENDOMETRIOID ADENOCARCINOMA, FIGO GRADE II, 12 CM. - THE OVARIAN SURFACE IS FOCALLY INVOLVED BY CARCINOMA. - NEGATIVE FOR LYMPHOVASCULAR INVASION. - BENIGN UNREMARKABLE FALLOPIAN TUBE, NEGATIVE FOR CARCINOMA. - SEE ONCOLOGY TABLE. - SEE NOTE 3. Cul-de-sac biopsy - METASTATIC ADENOCARCINOMA, MOST CONSISTENT WITH PRIMARY OVARIAN ENDOMETRIOID ADENOCARCINOMA. 4. Ovary, left - BENIGN UNREMARKABLE OVARY, NEGATIVE FOR MALIGNANCY. Microscopic Comment 1. UTERUS, CARCINOMA OR CARCINOSARCOMA Procedure: Total hysterectomy with bilateral salpingo-oophorectomy. Histologic  type: Endometrioid adenocarcinoma.  Histologic Grade: FIGO Grade I Myometrial invasion: Depth of invasion: 2 mm Myometrial thickness: 21 mm Uterine Serosa Involvement: Not identified Cervical stromal involvement: Not identified Extent of involvement of other organs: Not applicable Lymphovascular invasion: Not identified Regional Lymph Nodes: Examined: 0 Sentinel 0 Non-sentinel 0 Total Tumor block for ancillary studies: 1H MMR / MSI testing: Pending Pathologic Stage Classification (pTNM, AJCC 8th edition): pT1a, pNX (v4.1.0.0) 2. OVARY or FALLOPIAN TUBE or PRIMARY PERITONEUM: Procedure: Salpingo-oophorectomy Specimen Integrity: Intact Tumor Site: Right ovary Ovarian Surface Involvement (required only if applicable): Focally involved by carcinoma Fallopian Tube Surface Involvement (required only if applicable): Not identified Tumor Size: 12 cm Histologic Type: Endometrioid adenocarcinoma Histologic Grade: Grade II Implants (required for advanced stage serous/seromucinous borderline tumors only): Not applicable Other Tissue/ Organ Involvement: Cul de sac biopsy involved by tumor Largest Extrapelvic Peritoneal Focus (required only if applicable): Not applicable Peritoneal/Ascitic Fluid: Negative for carcinoma (case # [id number]) Treatment Effect (required only for high-grade serous carcinomas): Not applicable Regional Lymph Nodes: No lymph nodes submitted or found Number of Lymph Nodes Examined: 0 Pathologic Stage Classification (pTNM, AJCC 8th Edition): pT2b, pN0 Representative Tumor Block: 2B and 2E 1. Molecular study for microsatellite instability and immunohistochemical stains for MMR-related proteins are pending and will be reported in an addendum. 2. Immunohistochemical stain show that the ovarian tumor is positive for CK7 and PAX8 (both diffuse), CDX2 (patchy and weak); and negative for CK20. This immunoprofile is consistent with the above diagnosis. Dr. Lyndon Code has reviewed this  case and concurs with the above diagnosis. Molecular study for microsatellite instability and immunohistochemical stains for MMR-related proteins are pending and will be reported in an addendum    08/30/2017 Genetic Testing    Patient has genetic testing done for MSI  Results revealed patient has the following mutation(s): loss of [facility] 2    08/30/2017 Surgery    Surgeon: Mart Piggs, MD Pre-operative Diagnosis:  1. Adnexal mass 2. Abnormal uterine bleeding 3. H/o Cecal CA  Post-operative Diagnosis:  1. Adhesive disease post colon resection 2. Endometrial cancer NOS 3. Adenocarcinoma unknown origin, right ovary, suspicious for GI primary  Operation:  1. Lysis of adhesions ~30 minutes 2. Robotic-assisted laparoscopic total hysterectomy with right salpingo-oophorectomy and left salpingectomy 3. Left oophorectomy (RA-laparoscopic) 4. Pelvic washings  Findings: Adhesions of omentum to anterior abdominal wall. Enlarged cystic right ovary ~10cm. Uterus had small nodules on serosa near where right adnexa was intimate with the surface. No obvious intraoperative rupture of cyst, although in 2 areas the wall was thin and one of these areas had some bleeding. Slight scarring of left bladder dome to LUS/cervix. Uterus on frozen section c/w hyperplasia and a small focus of endometrial CA - no myo invasion, <2cm in size. Frozen section on the right adnexa was carcinoma, met from colon or possibly Gyn, favor GI primary, defer to permanent. Left ovary was WNL.      09/15/2017 Cancer Staging    Staging form: Corpus Uteri - Carcinoma and Carcinosarcoma, AJCC 8th Edition - Pathologic: FIGO Stage IA (pT1a, pN0, cM0) - Signed by Heath Lark, MD on 09/15/2017    09/26/2017 Procedure    Successful placement of a right internal jugular approach power injectable Port-A-Cath. The catheter is ready for immediate use.    11/07/2017 Imaging    1. Since 08/18/2017, similar to slight decrease in size of a  pancreatic body/tail junction lesion. Cross modality comparison relative to 09/16/2017 MRI is also grossly similar. 2. No evidence of metastatic disease.  3. Aortic Atherosclerosis (ICD10-I70.0).  4. Left nephrolithiasis.    11/18/2017 Genetic Testing    MSH2 c.2237dupT pathogenic mutation identified in the CancerNext panel.  The CancerNext gene panel offered by [employer] includes sequencing and rearrangement analysis for the following 34 genes:   APC, ATM, BARD1, BMPR1A, BRCA1, BRCA2, BRIP1, CDH1, CDK4, CDKN2A, CHEK2, DICER1, HOXB13, EPCAM, GREM1, MLH1, MRE11A, MSH2, MSH6, MUTYH, NBN, NF1, PALB2, PMS2, POLD1, POLE, PTEN, RAD50, RAD51C, RAD51D, SMAD4, SMARCA4, STK11, and TP53.  The report date is November 18, 2017.  MSH2 [id number] pathogenic mutation identified on somatic testing.  These results are consistent with a diagnosis of Lynch syndrome.     Secondary malignant neoplasm of right ovary ([place])   08/23/2017 Tumor Marker    Patient's tumor was tested for the following markers: CA-125 Results of the tumor marker test revealed 139.8    08/31/2017 Initial Diagnosis    Secondary malignant neoplasm of right ovary ([place])    09/15/2017 Cancer Staging    Staging form: Ovary, Fallopian Tube, and Primary Peritoneal Carcinoma, AJCC 8th Edition - Pathologic: Stage IIB (pT2b, pN0, cM0) - Signed by Heath Lark, MD on 09/15/2017    09/27/2017 Imaging    No evidence of metastatic disease or other acute findings within the thorax.  4 cm low-attenuation mass in pancreatic tail, highly suspicious for pancreatic carcinoma. This is caused splenic vein thrombosis, with new venous collaterals in the left upper quadrant. Consider endoscopic ultrasound with FNA for tissue diagnosis.  Stable benign hepatic hemangioma.     09/27/2017 Tumor Marker    Patient's tumor was tested for the following markers: CA-125 Results of the tumor marker test revealed 39.4    11/02/2017 Tumor Marker    Patient's  tumor was tested for the following markers: CA-125 Results of the tumor marker test revealed 19    11/07/2017 Tumor Marker    Patient's tumor was tested for the following markers: CA-125 Results of the tumor marker test revealed 18.3    11/18/2017 Genetic Testing    MSH2 c.2237dupT pathogenic mutation identified in the CancerNext panel.  The CancerNext gene panel offered by [employer] includes sequencing and rearrangement analysis for the following 34 genes:   APC, ATM, BARD1, BMPR1A, BRCA1, BRCA2, BRIP1, CDH1, CDK4, CDKN2A, CHEK2, DICER1, HOXB13, EPCAM, GREM1, MLH1, MRE11A, MSH2, MSH6, MUTYH, NBN, NF1, PALB2, PMS2, POLD1, POLE, PTEN, RAD50, RAD51C, RAD51D, SMAD4, SMARCA4, STK11, and TP53.  The report date is November 18, 2017.  MSH2 [id number] pathogenic mutation identified on somatic testing.  These results are consistent with a diagnosis of Lynch syndrome.    12/15/2017 Tumor Marker    Patient's tumor was tested for the following markers: CA-125 Results of the tumor marker test revealed 57.3    01/16/2018 Tumor Marker    Patient's tumor was tested for the following markers: CA-125 Results of the tumor marker test revealed 24.2     Pancreatic cancer ([place])   10/13/2017 Pathology Results    Pancreas tail mass, endoscopic ultrasound-guided, fine needle aspiration II (smears and cell block): Adenocarcinoma    11/18/2017 Genetic Testing    MSH2 c.2237dupT pathogenic mutation identified in the CancerNext panel.  The CancerNext gene panel offered by [employer] includes sequencing and rearrangement analysis for the following 34 genes:   APC, ATM, BARD1, BMPR1A, BRCA1, BRCA2, BRIP1, CDH1, CDK4, CDKN2A, CHEK2, DICER1, HOXB13, EPCAM, GREM1, MLH1, MRE11A, MSH2, MSH6, MUTYH, NBN, NF1, PALB2, PMS2, POLD1, POLE, PTEN, RAD50, RAD51C, RAD51D, SMAD4, SMARCA4, STK11, and TP53.  The report date  is November 18, 2017.  MSH2 [id number] pathogenic mutation identified on  somatic testing.  These results are consistent with a diagnosis of Lynch syndrome.    11/24/2017 Pathology Results    A. "TAIL OF PANCREAS AND SPLEEN", DISTAL PANCREATECTOMY AND SPLENECTOMY: Invasive ductal adenocarcinoma, moderately to poorly differentiated with focal signet ring cell features, of pancreas (distal).   The carcinoma is 2.5 cm in greatest dimension grossly.   Treatment effects present in the form of fibrosis (50%). No lymphovascular or definite perineural invasion identified. All surgical margins are negative for tumor or high-grade dysplasia. Adjacent mucinous neoplasm, most consistent with Intraductal papillary mucinous neoplasm (IPMN) with low-grade dysplasia.   Uninvolved pancreas show atrophy and focal acute inflammation.   Twelve benign lymph nodes (0/12). Spleen with no significant histopathologic abnormalities.  PROCEDURE: distal pancreatectomy and splenectomy TUMOR SITE: distal pancreas TUMOR SIZE:  GREATEST DIMENSION: 2.5 cm  ADDITIONAL DIMENSIONS:  x  cm HISTOLOGIC TYPE: ductal adenocarcinoma HISTOLOGIC GRADE: grade 3 TUMOR EXTENSION: peripancreatic soft tissue MARGINS: negative for tumor TREATMENT EFFECT: treatment effects present in the form of fibrosis (50%). LYMPHOVASCULAR INVASION: not identified PERINEURAL INVASION: no definite evidence REGIONAL LYMPH NODES:   NUMBER OF LYMPH NODES INVOLVED: 0   NUMBER OF LYMPH NODES EXAMINED: 12 PATHOLOGIC STAGE CLASSIFICATION (pTNM, AJCC 8th Ed): ypT2, ypN0 DISTANT METASTASIS (pM): pMx ADDITIONAL PATHOLOGIC FINDINGS: mucinous neoplasm, most consistent with intraductal papillary mucinous neoplasm (IPMN) with low-grade dysplasia, is identified adjacent to the main tumor.    11/24/2017 Cancer Staging    Staging form: Exocrine Pancreas, AJCC 8th Edition - Pathologic stage from 11/24/2017: Stage IB (pT2, pN0, cM0) - Signed by Truitt Merle, MD on 01/03/2018     11/25/2017 Surgery    She had surgery at [facility] 1. Exploratory Laparotomy 2. Distal Pancreatectomy and Splenectomy 3. Intraoperative Ultrasound 4. Open Cholecystectomy     12/15/2017 Cancer Staging    Staging form: Exocrine Pancreas, AJCC 8th Edition - Clinical: Stage IB (cT2, cN0, cM0) - Signed by Heath Lark, MD on 12/15/2017    12/28/2017 Imaging    12/28/2017 CT Abdomen  IMPRESSION: 1. Postoperative findings from recent partial pancreatectomy including a 21 cubic cm fluid collection along the pancreatic resection margin which could represent early pseudocyst. 2. Nodularity along the lateral limb of the left adrenal gland could also be postoperative but merit surveillance, as the pancreatic lesion was in close proximity to this adrenal gland on the prior CT of 11/07/2017. 3. Asymmetric fullness inferiorly in the left breast. The patient has a history of prior left breast procedures, correlation with mammography is recommended. 4. Other imaging findings of potential clinical significance: Stable hemangioma in the left hepatic lobe. Splenectomy. Aortic Atherosclerosis (ICD10-I70.0). Right hemicolectomy. Small focus of fat necrosis in the right anterior abdominal wall subcutaneous tissues near the laparotomy site. Bilateral nonobstructive nephrolithiasis.    01/02/2018 Tumor Marker    Patient's tumor was tested for the following markers: CA-19-9 Results of the tumor marker test revealed 5    01/03/2018 -  Chemotherapy    She received modified dose FOLFIRINOX     REVIEW OF SYSTEMS:   Eyes: Denies blurriness of vision Ears, nose, mouth, throat, and face: Denies mucositis or sore throat Cardiovascular: Denies palpitation, chest discomfort or lower extremity swelling Skin: Denies abnormal skin rashes Lymphatics: Denies new lymphadenopathy or easy bruising Neurological:Denies numbness, tingling or new weaknesses Behavioral/Psych: Mood is stable, no new changes  All other systems  were reviewed with the patient and are negative.  I have  reviewed the past medical history, past surgical history, social history and family history with the patient and they are unchanged from previous note.  ALLERGIES:  is allergic to codeine; penicillins; and bactrim [sulfamethoxazole-trimethoprim].  MEDICATIONS:  Current Outpatient Medications  Medication Sig Dispense Refill  . acetaminophen (TYLENOL) 650 MG CR tablet Take 1,300 mg by mouth every 8 (eight) hours.    . diclofenac sodium (VOLTAREN) 1 % GEL APPLY 3 GM TO AFFECTED 3 LARGE JOINTS UP TO 3 TIMES A DAY AS NEEDED AS DIRECTED 300 g 1  . diphenoxylate-atropine (LOMOTIL) 2.5-0.025 MG tablet Take 1-2 tablets by mouth 4 (four) times daily as needed for diarrhea or loose stools. 1 to 2 po QID prn diarrhea 60 tablet 1  . gabapentin (NEURONTIN) 300 MG capsule Take 1 capsule (300 mg total) by mouth 3 (three) times daily. 90 capsule 1  . HYDROmorphone (DILAUDID) 4 MG tablet Take 1 tablet (4 mg total) by mouth every 4 (four) hours as needed for severe pain. 30 tablet 0  . hydrOXYzine (ATARAX/VISTARIL) 10 MG tablet Take 1 tablet (10 mg total) by mouth 3 (three) times daily as needed for itching. 30 tablet 0  . lidocaine-prilocaine (EMLA) cream Apply to affected area once (Patient taking differently: Apply 1 application topically daily as needed (port). Apply to affected area once) 30 g 3  . loratadine (CLARITIN) 10 MG tablet Take 10 mg by mouth daily after breakfast.    . LORazepam (ATIVAN) 0.5 MG tablet Place 1 tablet (0.5 mg total) under the tongue every 6 (six) hours as needed for anxiety. 60 tablet 1  . naproxen sodium (ALEVE) 220 MG tablet Take 220 mg by mouth.    Marland Kitchen omeprazole (PRILOSEC) 20 MG capsule Take 20 mg by mouth 2 (two) times daily before a meal.     . ondansetron (ZOFRAN) 8 MG tablet Take 1 tablet (8 mg total) by mouth every 8 (eight) hours as needed for refractory nausea / vomiting. Start on day 3 after chemo. 30 tablet 1  .  predniSONE (DELTASONE) 10 MG tablet TAKE 1 TABLET BY MOUTH EVERY DAY WITH BREAKFAST (Patient taking differently: Take 5 mg by mouth daily with breakfast. ) 30 tablet 0  . prochlorperazine (COMPAZINE) 10 MG tablet Take 1 tablet (10 mg total) by mouth every 6 (six) hours as needed (Nausea or vomiting). 90 tablet 1  . simethicone (MYLICON) 150 MG chewable tablet Chew 125 mg by mouth every 6 (six) hours as needed for flatulence.    . triamcinolone (NASACORT) 55 MCG/ACT AERO nasal inhaler Place 2 sprays into the nose daily. 1 Inhaler 12   No current facility-administered medications for this visit.     PHYSICAL EXAMINATION: ECOG PERFORMANCE STATUS: 2 - Symptomatic, <50% confined to bed  Vitals:   02/24/18 1250  BP: (!) 127/100  Pulse: (!) 110  Resp: 18  Temp: 98.8 F (37.1 C)  SpO2: 97%   Filed Weights   02/24/18 1250  Weight: 213 lb (96.6 kg)    GENERAL:alert, no distress and comfortable SKIN: skin color, texture, turgor are normal, no rashes or significant lesions EYES: normal, Conjunctiva are pink and non-injected, sclera clear OROPHARYNX:no exudate, no erythema and lips, buccal mucosa, and tongue normal  NECK: supple, thyroid normal size, non-tender, without nodularity LYMPH:  no palpable lymphadenopathy in the cervical, axillary or inguinal LUNGS: clear to auscultation and percussion with normal breathing effort HEART: regular rate & rhythm and no murmurs and no lower extremity edema ABDOMEN:abdomen soft, non-tender and normal  bowel sounds Musculoskeletal:no cyanosis of digits and no clubbing  NEURO: alert & oriented x 3 with fluent speech, no focal motor/sensory deficits  LABORATORY DATA:  I have reviewed the data as listed    Component Value Date/Time   NA 140 02/24/2018 1224   K 4.3 02/24/2018 1224   CL 105 02/24/2018 1224   CO2 24 02/24/2018 1224   GLUCOSE 146 (H) 02/24/2018 1224   BUN 13 02/24/2018 1224   CREATININE 0.67 02/24/2018 1224   CREATININE 0.63  02/06/2018 1001   CREATININE 0.68 07/27/2017 0851   CALCIUM 9.2 02/24/2018 1224   PROT 6.6 02/24/2018 1224   ALBUMIN 3.3 (L) 02/24/2018 1224   AST 31 02/24/2018 1224   AST 20 02/06/2018 1001   ALT 63 (H) 02/24/2018 1224   ALT 46 (H) 02/06/2018 1001   ALKPHOS 98 02/24/2018 1224   BILITOT <0.2 (L) 02/24/2018 1224   BILITOT <0.2 (L) 02/06/2018 1001   GFRNONAA >60 02/24/2018 1224   GFRNONAA >60 02/06/2018 1001   GFRNONAA 106 07/27/2017 0851   GFRAA >60 02/24/2018 1224   GFRAA >60 02/06/2018 1001   GFRAA 123 07/27/2017 0851    No results found for: SPEP, UPEP  Lab Results  Component Value Date   WBC 21.4 (H) 02/24/2018   NEUTROABS 16.5 (H) 02/24/2018   HGB 11.7 (L) 02/24/2018   HCT 38.0 02/24/2018   MCV 92.2 02/24/2018   PLT 585 (H) 02/24/2018      Chemistry      Component Value Date/Time   NA 140 02/24/2018 1224   K 4.3 02/24/2018 1224   CL 105 02/24/2018 1224   CO2 24 02/24/2018 1224   BUN 13 02/24/2018 1224   CREATININE 0.67 02/24/2018 1224   CREATININE 0.63 02/06/2018 1001   CREATININE 0.68 07/27/2017 0851      Component Value Date/Time   CALCIUM 9.2 02/24/2018 1224   ALKPHOS 98 02/24/2018 1224   AST 31 02/24/2018 1224   AST 20 02/06/2018 1001   ALT 63 (H) 02/24/2018 1224   ALT 46 (H) 02/06/2018 1001   BILITOT <0.2 (L) 02/24/2018 1224   BILITOT <0.2 (L) 02/06/2018 1001       RADIOGRAPHIC STUDIES: I have personally reviewed the radiological images as listed and agreed with the findings in the report. Dg Chest 2 View  Result Date: 02/14/2018 CLINICAL DATA:  History of pancreatic carcinoma.  Cough EXAM: CHEST - 2 VIEW COMPARISON:  November 02, 2017 FINDINGS: Port-A-Cath tip is at the cavoatrial junction. No pneumothorax. The lungs are clear. The heart size and pulmonary vascularity are normal. No adenopathy. No bone lesions. IMPRESSION: No edema or consolidation.  No neoplastic focus demonstrable. Electronically Signed   By: Lowella Grip III M.D.   On:  02/14/2018 10:15    All questions were answered. The patient knows to call the clinic with any problems, questions or concerns. No barriers to learning was detected.  I spent 25 minutes counseling the patient face to face. The total time spent in the appointment was 30 minutes and more than 50% was on counseling and review of test results  Heath Lark, MD 02/24/2018 4:38 PM

## 2018-02-24 NOTE — Assessment & Plan Note (Signed)
She tolerated treatment poorly with major side effects including pancytopenia, severe nausea, diarrhea, pancytopenia and significant pain With recent reduced dose irinotecan and additional premedication, she tolerated last cycle of therapy better She is also doing better with home IV infusion along with IV antiemetics I plan to reduce premedication oral dexamethasone and only to use it after treatment for nausea I plan to repeat imaging study in February for objective assessment of response to therapy

## 2018-02-24 NOTE — Assessment & Plan Note (Signed)
Her symptoms are better controlled with regular IV fluids, IV antiemetics and oral antiemetics as needed.  We will continue the same

## 2018-02-24 NOTE — Assessment & Plan Note (Signed)
She has received 3 cycles of adjuvant chemotherapy.  This is placed on hold while she is undergoing treatment for pancreatic cancer. Her last tumor marker was satisfactory

## 2018-02-24 NOTE — Assessment & Plan Note (Signed)
Her chronic arthritis pain is stable with steroid treatment along with pain medicine as needed.  She will continue the same

## 2018-02-24 NOTE — Assessment & Plan Note (Signed)
She denies worsening neuropathy on treatment.  She will continue chronic pain management I recommend she continues gabapentin as tolerated

## 2018-02-25 ENCOUNTER — Ambulatory Visit: Payer: BLUE CROSS/BLUE SHIELD

## 2018-02-27 ENCOUNTER — Telehealth: Payer: Self-pay | Admitting: Rheumatology

## 2018-02-27 ENCOUNTER — Telehealth: Payer: Self-pay | Admitting: Hematology and Oncology

## 2018-02-27 ENCOUNTER — Telehealth (INDEPENDENT_AMBULATORY_CARE_PROVIDER_SITE_OTHER): Payer: Self-pay | Admitting: Rheumatology

## 2018-02-27 MED ORDER — PREDNISONE 5 MG PO TABS: 5.0000 mg | ORAL_TABLET | Freq: Every day | ORAL | 0 refills | Status: DC

## 2018-02-27 NOTE — Telephone Encounter (Signed)
Called regarding 1/21 °

## 2018-02-27 NOTE — Telephone Encounter (Signed)
Patient called needing to know her first DOS with Dr. Estanislado Pandy. I advised her SRS Initial visit shows 12/01/2005.

## 2018-02-27 NOTE — Telephone Encounter (Signed)
I spoke with Dana Hicks.  She states that since she has been taking chemotherapy she has been experiencing increased joint pain.  I discussed with her that sometimes the chemotherapy medications can cause increased arthralgias.  As long as she does not have joint inflammation it is better for her to stay on the lowest dose of prednisone.  She was in agreement to reduce her prednisone dose to 5 mg p.o. daily.  She will notify me if she develops increased joint swelling or inflammation.  She also mentioned that she will be applying for disability due to multiple medical problems.

## 2018-02-27 NOTE — Telephone Encounter (Signed)
Last Visit: 12/26/17 Next Visit: 05/15/18  Patient states she has increased her dose of Prednisone back to 10 mg. Patient states the dose was only lowered because she had surgery and it was to help with the healing process. Patient states her oncologists is aware.  According to the last office note on 12/26/17 She is on Prednisone 5 mg po daily.  I discouraged the use of high-dose prednisone as it causes weight gain.  She had no synovitis on examination today.  I believe her rheumatoid arthritis is quite well controlled with the chemotherapy.  Per office note on 01/02/18 visit with Dr. Burr Medico -OK to resume prednisone dose to 10mg  for her RA if needed, will wait and see what chemo does to her RA   Please advise.

## 2018-02-27 NOTE — Telephone Encounter (Signed)
Patient called requesting prescription refill of Prednisone 10 mg to be sent to [facility] on [address] [place].

## 2018-02-28 ENCOUNTER — Other Ambulatory Visit: Payer: Self-pay | Admitting: Hematology and Oncology

## 2018-02-28 ENCOUNTER — Inpatient Hospital Stay: Payer: BLUE CROSS/BLUE SHIELD

## 2018-02-28 VITALS — BP 126/78 | HR 97 | Temp 98.6°F | Resp 18

## 2018-02-28 DIAGNOSIS — E099 Drug or chemical induced diabetes mellitus without complications: Secondary | ICD-10-CM | POA: Diagnosis not present

## 2018-02-28 DIAGNOSIS — C541 Malignant neoplasm of endometrium: Secondary | ICD-10-CM | POA: Diagnosis not present

## 2018-02-28 DIAGNOSIS — C252 Malignant neoplasm of tail of pancreas: Secondary | ICD-10-CM | POA: Diagnosis not present

## 2018-02-28 DIAGNOSIS — R748 Abnormal levels of other serum enzymes: Secondary | ICD-10-CM | POA: Diagnosis not present

## 2018-02-28 DIAGNOSIS — R197 Diarrhea, unspecified: Secondary | ICD-10-CM | POA: Diagnosis not present

## 2018-02-28 DIAGNOSIS — Z79899 Other long term (current) drug therapy: Secondary | ICD-10-CM | POA: Diagnosis not present

## 2018-02-28 DIAGNOSIS — R110 Nausea: Secondary | ICD-10-CM | POA: Diagnosis not present

## 2018-02-28 DIAGNOSIS — Z5111 Encounter for antineoplastic chemotherapy: Secondary | ICD-10-CM | POA: Diagnosis not present

## 2018-02-28 DIAGNOSIS — G620 Drug-induced polyneuropathy: Secondary | ICD-10-CM | POA: Diagnosis not present

## 2018-02-28 DIAGNOSIS — J00 Acute nasopharyngitis [common cold]: Secondary | ICD-10-CM | POA: Diagnosis not present

## 2018-02-28 DIAGNOSIS — Z1509 Genetic susceptibility to other malignant neoplasm: Secondary | ICD-10-CM | POA: Diagnosis not present

## 2018-02-28 DIAGNOSIS — C7961 Secondary malignant neoplasm of right ovary: Secondary | ICD-10-CM | POA: Diagnosis not present

## 2018-02-28 MED ORDER — ATROPINE SULFATE 1 MG/ML IJ SOLN
0.5000 mg | Freq: Once | INTRAMUSCULAR | Status: AC | PRN
  Administered 2018-02-28: 0.5 mg via INTRAVENOUS

## 2018-02-28 MED ORDER — PALONOSETRON HCL INJECTION 0.25 MG/5ML
INTRAVENOUS | Status: AC
  Filled 2018-02-28: qty 5

## 2018-02-28 MED ORDER — ATROPINE SULFATE 1 MG/ML IJ SOLN
INTRAMUSCULAR | Status: AC
  Filled 2018-02-28: qty 1

## 2018-02-28 MED ORDER — SODIUM CHLORIDE 0.9 % IV SOLN
Freq: Once | INTRAVENOUS | Status: AC
  Administered 2018-02-28: 09:00:00 via INTRAVENOUS
  Filled 2018-02-28: qty 5

## 2018-02-28 MED ORDER — DEXTROSE 5 % IV SOLN
Freq: Once | INTRAVENOUS | Status: AC
  Administered 2018-02-28: 09:00:00 via INTRAVENOUS
  Filled 2018-02-28: qty 250

## 2018-02-28 MED ORDER — DIPHENHYDRAMINE HCL 50 MG/ML IJ SOLN
INTRAMUSCULAR | Status: AC
  Filled 2018-02-28: qty 1

## 2018-02-28 MED ORDER — FAMOTIDINE IN NACL 20-0.9 MG/50ML-% IV SOLN
INTRAVENOUS | Status: AC
  Filled 2018-02-28: qty 50

## 2018-02-28 MED ORDER — DIPHENHYDRAMINE HCL 50 MG/ML IJ SOLN
50.0000 mg | Freq: Once | INTRAMUSCULAR | Status: AC
  Administered 2018-02-28: 50 mg via INTRAVENOUS

## 2018-02-28 MED ORDER — LEUCOVORIN CALCIUM INJECTION 350 MG
400.0000 mg/m2 | Freq: Once | INTRAVENOUS | Status: AC
  Administered 2018-02-28: 828 mg via INTRAVENOUS
  Filled 2018-02-28: qty 41.4

## 2018-02-28 MED ORDER — SODIUM CHLORIDE 0.9 % IV SOLN
2425.0000 mg/m2 | INTRAVENOUS | Status: DC
  Administered 2018-02-28: 5000 mg via INTRAVENOUS
  Filled 2018-02-28: qty 100

## 2018-02-28 MED ORDER — IRINOTECAN HCL CHEMO INJECTION 100 MG/5ML
112.5000 mg/m2 | Freq: Once | INTRAVENOUS | Status: AC
  Administered 2018-02-28: 240 mg via INTRAVENOUS
  Filled 2018-02-28: qty 12

## 2018-02-28 MED ORDER — FAMOTIDINE IN NACL 20-0.9 MG/50ML-% IV SOLN
20.0000 mg | Freq: Once | INTRAVENOUS | Status: AC
  Administered 2018-02-28: 20 mg via INTRAVENOUS

## 2018-02-28 MED ORDER — SODIUM CHLORIDE 0.9% FLUSH
10.0000 mL | INTRAVENOUS | Status: DC | PRN
  Administered 2018-02-28: 10 mL
  Filled 2018-02-28: qty 10

## 2018-02-28 MED ORDER — PALONOSETRON HCL INJECTION 0.25 MG/5ML
0.2500 mg | Freq: Once | INTRAVENOUS | Status: AC
  Administered 2018-02-28: 0.25 mg via INTRAVENOUS

## 2018-02-28 MED ORDER — OXALIPLATIN CHEMO INJECTION 100 MG/20ML
85.0000 mg/m2 | Freq: Once | INTRAVENOUS | Status: AC
  Administered 2018-02-28: 175 mg via INTRAVENOUS
  Filled 2018-02-28: qty 35

## 2018-02-28 NOTE — Patient Instructions (Signed)
[facility] Discharge Instructions for Patients Receiving Chemotherapy  Today you received the following chemotherapy agents: Oxaliplatin (Eloxatin), Irinotecan (Camptosar), Leucovorin, Fluorouracil (Adrucil, 5-FU)  To help prevent nausea and vomiting after your treatment, we encourage you to take your nausea medication as directed. Received Aloxi during treatment today-->Take Compazine (not Zofran) for the next 3 days as needed.    If you develop nausea and vomiting that is not controlled by your nausea medication, call the clinic.   BELOW ARE SYMPTOMS THAT SHOULD BE REPORTED IMMEDIATELY:  *FEVER GREATER THAN 100.5 F  *CHILLS WITH OR WITHOUT FEVER  NAUSEA AND VOMITING THAT IS NOT CONTROLLED WITH YOUR NAUSEA MEDICATION  *UNUSUAL SHORTNESS OF BREATH  *UNUSUAL BRUISING OR BLEEDING  TENDERNESS IN MOUTH AND THROAT WITH OR WITHOUT PRESENCE OF ULCERS  *URINARY PROBLEMS  *BOWEL PROBLEMS  UNUSUAL RASH Items with * indicate a potential emergency and should be followed up as soon as possible.  Feel free to call the clinic should you have any questions or concerns. The clinic phone number is [phone].  Please show the [facility] at check-in to the Emergency Department and triage nurse.

## 2018-03-01 ENCOUNTER — Other Ambulatory Visit: Payer: Self-pay

## 2018-03-01 ENCOUNTER — Telehealth: Payer: Self-pay

## 2018-03-01 DIAGNOSIS — C7961 Secondary malignant neoplasm of right ovary: Secondary | ICD-10-CM

## 2018-03-01 DIAGNOSIS — E860 Dehydration: Secondary | ICD-10-CM | POA: Diagnosis not present

## 2018-03-01 DIAGNOSIS — C252 Malignant neoplasm of tail of pancreas: Secondary | ICD-10-CM | POA: Diagnosis not present

## 2018-03-01 DIAGNOSIS — C541 Malignant neoplasm of endometrium: Secondary | ICD-10-CM

## 2018-03-01 MED ORDER — ONDANSETRON HCL 8 MG PO TABS: 8.0000 mg | ORAL_TABLET | Freq: Three times a day (TID) | ORAL | 1 refills | Status: DC | PRN

## 2018-03-01 NOTE — Telephone Encounter (Signed)
She called and left a message to call her.  Called back. She needs a refill on her Zofran. Instructed to not start until 3rd day after treatment if needed. Instructed to take Compazine is needed and Lorazepam if needed, and to call the office id needed. She verbalized understanding. Refill sent.

## 2018-03-02 ENCOUNTER — Inpatient Hospital Stay: Payer: BLUE CROSS/BLUE SHIELD

## 2018-03-02 VITALS — BP 128/78 | HR 92 | Temp 98.6°F | Resp 18

## 2018-03-02 DIAGNOSIS — C252 Malignant neoplasm of tail of pancreas: Secondary | ICD-10-CM | POA: Diagnosis not present

## 2018-03-02 DIAGNOSIS — C541 Malignant neoplasm of endometrium: Secondary | ICD-10-CM | POA: Diagnosis not present

## 2018-03-02 DIAGNOSIS — Z79899 Other long term (current) drug therapy: Secondary | ICD-10-CM | POA: Diagnosis not present

## 2018-03-02 DIAGNOSIS — E099 Drug or chemical induced diabetes mellitus without complications: Secondary | ICD-10-CM | POA: Diagnosis not present

## 2018-03-02 DIAGNOSIS — R197 Diarrhea, unspecified: Secondary | ICD-10-CM | POA: Diagnosis not present

## 2018-03-02 DIAGNOSIS — J00 Acute nasopharyngitis [common cold]: Secondary | ICD-10-CM | POA: Diagnosis not present

## 2018-03-02 DIAGNOSIS — Z1509 Genetic susceptibility to other malignant neoplasm: Secondary | ICD-10-CM | POA: Diagnosis not present

## 2018-03-02 DIAGNOSIS — Z5111 Encounter for antineoplastic chemotherapy: Secondary | ICD-10-CM | POA: Diagnosis not present

## 2018-03-02 DIAGNOSIS — R748 Abnormal levels of other serum enzymes: Secondary | ICD-10-CM | POA: Diagnosis not present

## 2018-03-02 DIAGNOSIS — R110 Nausea: Secondary | ICD-10-CM | POA: Diagnosis not present

## 2018-03-02 DIAGNOSIS — C7961 Secondary malignant neoplasm of right ovary: Secondary | ICD-10-CM | POA: Diagnosis not present

## 2018-03-02 DIAGNOSIS — G620 Drug-induced polyneuropathy: Secondary | ICD-10-CM | POA: Diagnosis not present

## 2018-03-02 MED ORDER — PEGFILGRASTIM INJECTION 6 MG/0.6ML ~~LOC~~
6.0000 mg | PREFILLED_SYRINGE | Freq: Once | SUBCUTANEOUS | Status: AC
  Administered 2018-03-02: 6 mg via SUBCUTANEOUS

## 2018-03-02 MED ORDER — HEPARIN SOD (PORK) LOCK FLUSH 100 UNIT/ML IV SOLN
500.0000 [IU] | Freq: Once | INTRAVENOUS | Status: AC | PRN
  Administered 2018-03-02: 500 [IU]
  Filled 2018-03-02: qty 5

## 2018-03-02 MED ORDER — SODIUM CHLORIDE 0.9% FLUSH
10.0000 mL | INTRAVENOUS | Status: DC | PRN
  Administered 2018-03-02: 10 mL
  Filled 2018-03-02: qty 10

## 2018-03-02 MED ORDER — PEGFILGRASTIM INJECTION 6 MG/0.6ML ~~LOC~~
PREFILLED_SYRINGE | SUBCUTANEOUS | Status: AC
  Filled 2018-03-02: qty 0.6

## 2018-03-02 NOTE — Patient Instructions (Signed)
Pegfilgrastim injection What is this medicine? PEGFILGRASTIM (PEG fil gra stim) is a long-acting granulocyte colony-stimulating factor that stimulates the growth of neutrophils, a type of white blood cell important in the body's fight against infection. It is used to reduce the incidence of fever and infection in patients with certain types of cancer who are receiving chemotherapy that affects the bone marrow, and to increase survival after being exposed to high doses of radiation. This medicine may be used for other purposes; ask your health care provider or pharmacist if you have questions. COMMON BRAND NAME(S): Neulasta What should I tell my health care provider before I take this medicine? They need to know if you have any of these conditions: -kidney disease -latex allergy -ongoing radiation therapy -sickle cell disease -skin reactions to acrylic adhesives (On-Body Injector only) -an unusual or allergic reaction to pegfilgrastim, filgrastim, other medicines, foods, dyes, or preservatives -pregnant or trying to get pregnant -breast-feeding How should I use this medicine? This medicine is for injection under the skin. If you get this medicine at home, you will be taught how to prepare and give the pre-filled syringe or how to use the On-body Injector. Refer to the patient Instructions for Use for detailed instructions. Use exactly as directed. Tell your healthcare provider immediately if you suspect that the On-body Injector may not have performed as intended or if you suspect the use of the On-body Injector resulted in a missed or partial dose. It is important that you put your used needles and syringes in a special sharps container. Do not put them in a trash can. If you do not have a sharps container, call your pharmacist or healthcare provider to get one. Talk to your pediatrician regarding the use of this medicine in children. While this drug may be prescribed for selected conditions,  precautions do apply. Overdosage: If you think you have taken too much of this medicine contact a poison control center or emergency room at once. NOTE: This medicine is only for you. Do not share this medicine with others. What if I miss a dose? It is important not to miss your dose. Call your doctor or health care professional if you miss your dose. If you miss a dose due to an On-body Injector failure or leakage, a new dose should be administered as soon as possible using a single prefilled syringe for manual use. What may interact with this medicine? Interactions have not been studied. Give your health care provider a list of all the medicines, herbs, non-prescription drugs, or dietary supplements you use. Also tell them if you smoke, drink alcohol, or use illegal drugs. Some items may interact with your medicine. This list may not describe all possible interactions. Give your health care provider a list of all the medicines, herbs, non-prescription drugs, or dietary supplements you use. Also tell them if you smoke, drink alcohol, or use illegal drugs. Some items may interact with your medicine. What should I watch for while using this medicine? You may need blood work done while you are taking this medicine. If you are going to need a MRI, CT scan, or other procedure, tell your doctor that you are using this medicine (On-Body Injector only). What side effects may I notice from receiving this medicine? Side effects that you should report to your doctor or health care professional as soon as possible: -allergic reactions like skin rash, itching or hives, swelling of the face, lips, or tongue -dizziness -fever -pain, redness, or irritation at site   where injected -pinpoint red spots on the skin -red or dark-brown urine -shortness of breath or breathing problems -stomach or side pain, or pain at the shoulder -swelling -tiredness -trouble passing urine or change in the amount of urine Side  effects that usually do not require medical attention (report to your doctor or health care professional if they continue or are bothersome): -bone pain -muscle pain This list may not describe all possible side effects. Call your doctor for medical advice about side effects. You may report side effects to FDA at 1-800-FDA-1088. Where should I keep my medicine? Keep out of the reach of children. Store pre-filled syringes in a refrigerator between 2 and 8 degrees C (36 and 46 degrees F). Do not freeze. Keep in carton to protect from light. Throw away this medicine if it is left out of the refrigerator for more than 48 hours. Throw away any unused medicine after the expiration date. NOTE: This sheet is a summary. It may not cover all possible information. If you have questions about this medicine, talk to your doctor, pharmacist, or health care provider.  2018 [employer] Standard (2016-02-05 12:58:03)  

## 2018-03-05 DIAGNOSIS — C252 Malignant neoplasm of tail of pancreas: Secondary | ICD-10-CM | POA: Diagnosis not present

## 2018-03-06 DIAGNOSIS — C252 Malignant neoplasm of tail of pancreas: Secondary | ICD-10-CM | POA: Diagnosis not present

## 2018-03-13 ENCOUNTER — Telehealth: Payer: Self-pay

## 2018-03-13 NOTE — Telephone Encounter (Signed)
She called and left a message. She has run a temperature for the last 3 day's of 99.3 to 99.8. Complaining of sinus headache and stuffy nose. She is taking tylenol and sudafed for symptoms.  She is scheduled for chemo tomorrow.

## 2018-03-13 NOTE — Telephone Encounter (Signed)
Called with below message. She verbalized understanding. 

## 2018-03-13 NOTE — Telephone Encounter (Signed)
I do not recommend additional antibiotics We will proceed with treatment unless her blood count is not normal

## 2018-03-14 ENCOUNTER — Telehealth: Payer: Self-pay | Admitting: Hematology and Oncology

## 2018-03-14 ENCOUNTER — Inpatient Hospital Stay: Payer: BLUE CROSS/BLUE SHIELD

## 2018-03-14 ENCOUNTER — Inpatient Hospital Stay (HOSPITAL_BASED_OUTPATIENT_CLINIC_OR_DEPARTMENT_OTHER): Payer: BLUE CROSS/BLUE SHIELD | Admitting: Hematology and Oncology

## 2018-03-14 ENCOUNTER — Encounter: Payer: Self-pay | Admitting: Hematology and Oncology

## 2018-03-14 VITALS — BP 154/86 | HR 113 | Temp 97.7°F | Resp 20 | Ht 62.0 in | Wt 214.4 lb

## 2018-03-14 DIAGNOSIS — Z95828 Presence of other vascular implants and grafts: Secondary | ICD-10-CM

## 2018-03-14 DIAGNOSIS — G620 Drug-induced polyneuropathy: Secondary | ICD-10-CM

## 2018-03-14 DIAGNOSIS — C541 Malignant neoplasm of endometrium: Secondary | ICD-10-CM

## 2018-03-14 DIAGNOSIS — R748 Abnormal levels of other serum enzymes: Secondary | ICD-10-CM | POA: Diagnosis not present

## 2018-03-14 DIAGNOSIS — Z1509 Genetic susceptibility to other malignant neoplasm: Secondary | ICD-10-CM | POA: Diagnosis not present

## 2018-03-14 DIAGNOSIS — R110 Nausea: Secondary | ICD-10-CM | POA: Diagnosis not present

## 2018-03-14 DIAGNOSIS — C252 Malignant neoplasm of tail of pancreas: Secondary | ICD-10-CM

## 2018-03-14 DIAGNOSIS — E099 Drug or chemical induced diabetes mellitus without complications: Secondary | ICD-10-CM | POA: Diagnosis not present

## 2018-03-14 DIAGNOSIS — T451X5A Adverse effect of antineoplastic and immunosuppressive drugs, initial encounter: Secondary | ICD-10-CM

## 2018-03-14 DIAGNOSIS — D490 Neoplasm of unspecified behavior of digestive system: Secondary | ICD-10-CM

## 2018-03-14 DIAGNOSIS — M898X9 Other specified disorders of bone, unspecified site: Secondary | ICD-10-CM

## 2018-03-14 DIAGNOSIS — J00 Acute nasopharyngitis [common cold]: Secondary | ICD-10-CM | POA: Diagnosis not present

## 2018-03-14 DIAGNOSIS — Z5111 Encounter for antineoplastic chemotherapy: Secondary | ICD-10-CM | POA: Diagnosis not present

## 2018-03-14 DIAGNOSIS — T380X5A Adverse effect of glucocorticoids and synthetic analogues, initial encounter: Secondary | ICD-10-CM

## 2018-03-14 DIAGNOSIS — C7961 Secondary malignant neoplasm of right ovary: Secondary | ICD-10-CM | POA: Diagnosis not present

## 2018-03-14 DIAGNOSIS — Z79899 Other long term (current) drug therapy: Secondary | ICD-10-CM | POA: Diagnosis not present

## 2018-03-14 DIAGNOSIS — R197 Diarrhea, unspecified: Secondary | ICD-10-CM | POA: Diagnosis not present

## 2018-03-14 LAB — COMPREHENSIVE METABOLIC PANEL
ALT: 58 U/L — ABNORMAL HIGH (ref 0–44)
AST: 30 U/L (ref 15–41)
Albumin: 3.4 g/dL — ABNORMAL LOW (ref 3.5–5.0)
Alkaline Phosphatase: 131 U/L — ABNORMAL HIGH (ref 38–126)
Anion gap: 13 (ref 5–15)
BUN: 7 mg/dL (ref 6–20)
CO2: 20 mmol/L — ABNORMAL LOW (ref 22–32)
Calcium: 9.7 mg/dL (ref 8.9–10.3)
Chloride: 107 mmol/L (ref 98–111)
Creatinine, Ser: 0.65 mg/dL (ref 0.44–1.00)
GFR calc Af Amer: >60 mL/min (ref >60)
GFR calc non Af Amer: >60 mL/min (ref >60)
Glucose, Bld: 210 mg/dL — ABNORMAL HIGH (ref 70–99)
Potassium: 4.1 mmol/L (ref 3.5–5.1)
Sodium: 140 mmol/L (ref 135–145)
Total Bilirubin: <0.2 mg/dL — ABNORMAL LOW (ref 0.3–1.2)
Total Protein: 7.5 g/dL (ref 6.5–8.1)

## 2018-03-14 LAB — CBC WITH DIFFERENTIAL/PLATELET
Abs Immature Granulocytes: 2.37 10*3/uL — ABNORMAL HIGH (ref 0.00–0.07)
Basophils Absolute: 0.1 10*3/uL (ref 0.0–0.1)
Basophils Relative: 0 %
Eosinophils Absolute: 0 10*3/uL (ref 0.0–0.5)
Eosinophils Relative: 0 %
HCT: 38.8 % (ref 36.0–46.0)
Hemoglobin: 11.9 g/dL — ABNORMAL LOW (ref 12.0–15.0)
Immature Granulocytes: 9 %
Lymphocytes Relative: 8 %
Lymphs Abs: 2.1 10*3/uL (ref 0.7–4.0)
MCH: 28.5 pg (ref 26.0–34.0)
MCHC: 30.7 g/dL (ref 30.0–36.0)
MCV: 92.8 fL (ref 80.0–100.0)
Monocytes Absolute: 0.9 10*3/uL (ref 0.1–1.0)
Monocytes Relative: 3 %
Neutro Abs: 21 10*3/uL — ABNORMAL HIGH (ref 1.7–7.7)
Neutrophils Relative %: 80 %
Platelets: 405 10*3/uL — ABNORMAL HIGH (ref 150–400)
RBC: 4.18 MIL/uL (ref 3.87–5.11)
RDW: 22.2 % — ABNORMAL HIGH (ref 11.5–15.5)
WBC: 26.6 10*3/uL — ABNORMAL HIGH (ref 4.0–10.5)
nRBC: 1 % — ABNORMAL HIGH (ref 0.0–0.2)

## 2018-03-14 LAB — MAGNESIUM: Magnesium: 2 mg/dL (ref 1.7–2.4)

## 2018-03-14 MED ORDER — IRINOTECAN HCL CHEMO INJECTION 100 MG/5ML
112.5000 mg/m2 | Freq: Once | INTRAVENOUS | Status: AC
  Administered 2018-03-14: 240 mg via INTRAVENOUS
  Filled 2018-03-14: qty 12

## 2018-03-14 MED ORDER — PALONOSETRON HCL INJECTION 0.25 MG/5ML
INTRAVENOUS | Status: AC
  Filled 2018-03-14: qty 5

## 2018-03-14 MED ORDER — DEXAMETHASONE 4 MG PO TABS: 4.0000 mg | ORAL_TABLET | Freq: Two times a day (BID) | ORAL | 1 refills | Status: DC

## 2018-03-14 MED ORDER — ATROPINE SULFATE 1 MG/ML IJ SOLN
0.5000 mg | Freq: Once | INTRAMUSCULAR | Status: AC | PRN
  Administered 2018-03-14: 0.5 mg via INTRAVENOUS

## 2018-03-14 MED ORDER — DIPHENHYDRAMINE HCL 50 MG/ML IJ SOLN
50.0000 mg | Freq: Once | INTRAMUSCULAR | Status: AC
  Administered 2018-03-14: 50 mg via INTRAVENOUS

## 2018-03-14 MED ORDER — DIPHENHYDRAMINE HCL 50 MG/ML IJ SOLN
INTRAMUSCULAR | Status: AC
  Filled 2018-03-14: qty 1

## 2018-03-14 MED ORDER — OXALIPLATIN CHEMO INJECTION 100 MG/20ML
85.0000 mg/m2 | Freq: Once | INTRAVENOUS | Status: AC
  Administered 2018-03-14: 175 mg via INTRAVENOUS
  Filled 2018-03-14: qty 35

## 2018-03-14 MED ORDER — SODIUM CHLORIDE 0.9 % IV SOLN
2425.0000 mg/m2 | INTRAVENOUS | Status: DC
  Administered 2018-03-14: 5000 mg via INTRAVENOUS
  Filled 2018-03-14: qty 100

## 2018-03-14 MED ORDER — FAMOTIDINE IN NACL 20-0.9 MG/50ML-% IV SOLN
20.0000 mg | Freq: Once | INTRAVENOUS | Status: AC
  Administered 2018-03-14: 20 mg via INTRAVENOUS

## 2018-03-14 MED ORDER — FAMOTIDINE IN NACL 20-0.9 MG/50ML-% IV SOLN
INTRAVENOUS | Status: AC
  Filled 2018-03-14: qty 50

## 2018-03-14 MED ORDER — SODIUM CHLORIDE 0.9% FLUSH
10.0000 mL | INTRAVENOUS | Status: DC | PRN
  Administered 2018-03-14: 10 mL via INTRAVENOUS
  Filled 2018-03-14: qty 10

## 2018-03-14 MED ORDER — ATROPINE SULFATE 1 MG/ML IJ SOLN
INTRAMUSCULAR | Status: AC
  Filled 2018-03-14: qty 1

## 2018-03-14 MED ORDER — METFORMIN HCL 500 MG PO TABS: 500.0000 mg | ORAL_TABLET | Freq: Two times a day (BID) | ORAL | 1 refills | Status: DC

## 2018-03-14 MED ORDER — PALONOSETRON HCL INJECTION 0.25 MG/5ML
0.2500 mg | Freq: Once | INTRAVENOUS | Status: AC
  Administered 2018-03-14: 0.25 mg via INTRAVENOUS

## 2018-03-14 MED ORDER — LEUCOVORIN CALCIUM INJECTION 350 MG
400.0000 mg/m2 | Freq: Once | INTRAVENOUS | Status: AC
  Administered 2018-03-14: 828 mg via INTRAVENOUS
  Filled 2018-03-14: qty 41.4

## 2018-03-14 MED ORDER — DEXTROSE 5 % IV SOLN
Freq: Once | INTRAVENOUS | Status: AC
  Administered 2018-03-14: 10:00:00 via INTRAVENOUS
  Filled 2018-03-14: qty 250

## 2018-03-14 MED ORDER — SODIUM CHLORIDE 0.9 % IV SOLN
Freq: Once | INTRAVENOUS | Status: AC
  Administered 2018-03-14: 11:00:00 via INTRAVENOUS
  Filled 2018-03-14: qty 5

## 2018-03-14 NOTE — Assessment & Plan Note (Signed)
She has mildly elevated liver enzymes, likely due to recent chemotherapy.  Observe only for now

## 2018-03-14 NOTE — Assessment & Plan Note (Signed)
Her chronic arthritis pain is stable with steroid treatment along with pain medicine as needed.  She will continue the same

## 2018-03-14 NOTE — Telephone Encounter (Signed)
Gave avs and calendar ° °

## 2018-03-14 NOTE — Progress Notes (Signed)
Patient presented to infusion room with asymptomatic tachycardia. Dr. Alvy Bimler aware and advises ok to proceed with treatment as ordered. No additional orders received.

## 2018-03-14 NOTE — Assessment & Plan Note (Signed)
She tolerated last cycle of treatment better with dose adjustment and aggressive supportive care She is due for CT imaging next month and I will order it We will proceed with treatment as scheduled

## 2018-03-14 NOTE — Assessment & Plan Note (Signed)
She denies worsening neuropathy on treatment.  She will continue chronic pain management I recommend she continues gabapentin as tolerated

## 2018-03-14 NOTE — Assessment & Plan Note (Signed)
She has received 3 cycles of adjuvant chemotherapy.  This is placed on hold while she is undergoing treatment for pancreatic cancer. Her last tumor marker was satisfactory

## 2018-03-14 NOTE — Patient Instructions (Signed)
[facility] Discharge Instructions for Patients Receiving Chemotherapy  Today you received the following chemotherapy agents Oxaliplatin, Irinotecan, Leucovorin, and 5FU  To help prevent nausea and vomiting after your treatment, we encourage you to take your nausea medication as directed   If you develop nausea and vomiting that is not controlled by your nausea medication, call the clinic.   BELOW ARE SYMPTOMS THAT SHOULD BE REPORTED IMMEDIATELY:  *FEVER GREATER THAN 100.5 F  *CHILLS WITH OR WITHOUT FEVER  NAUSEA AND VOMITING THAT IS NOT CONTROLLED WITH YOUR NAUSEA MEDICATION  *UNUSUAL SHORTNESS OF BREATH  *UNUSUAL BRUISING OR BLEEDING  TENDERNESS IN MOUTH AND THROAT WITH OR WITHOUT PRESENCE OF ULCERS  *URINARY PROBLEMS  *BOWEL PROBLEMS  UNUSUAL RASH Items with * indicate a potential emergency and should be followed up as soon as possible.  Feel free to call the clinic should you have any questions or concerns. The clinic phone number is [phone].  Please show the CHEMO ALERT CARD at check-in to the Emergency Department and triage nurse.   

## 2018-03-14 NOTE — Assessment & Plan Note (Signed)
She has persistent symptoms of nasal drainage and have seen ENT and Allergist before I recommend consideration to see ENT again for further evaluation

## 2018-03-14 NOTE — Assessment & Plan Note (Signed)
Her symptoms are better controlled with regular IV fluids, IV antiemetics and oral antiemetics as needed.  We will continue the same

## 2018-03-14 NOTE — Progress Notes (Signed)
[place] OFFICE PROGRESS NOTE  Patient Care Team: [name], MD as PCP - General (Family Medicine)  ASSESSMENT & PLAN:  Pancreatic cancer [facility]) She tolerated last cycle of treatment better with dose adjustment and aggressive supportive care She is due for CT imaging next month and I will order it We will proceed with treatment as scheduled  Secondary malignant neoplasm of right ovary [facility]) She has received 3 cycles of adjuvant chemotherapy.  This is placed on hold while she is undergoing treatment for pancreatic cancer. Her last tumor marker was satisfactory  Acute rhinitis She has persistent symptoms of nasal drainage and have seen ENT and Allergist before I recommend consideration to see ENT again for further evaluation  Chemotherapy-induced nausea Her symptoms are better controlled with regular IV fluids, IV antiemetics and oral antiemetics as needed.  We will continue the same  Steroid-induced diabetes ([place]) We discussed aggressive management with dietary modifications I recommend trial of oral metformin  Elevated liver enzymes She has mildly elevated liver enzymes, likely due to recent chemotherapy.  Observe only for now  Peripheral neuropathy due to chemotherapy [facility]) She denies worsening neuropathy on treatment.  She will continue chronic pain management I recommend she continues gabapentin as tolerated  Bone pain Her chronic arthritis pain is stable with steroid treatment along with pain medicine as needed.  She will continue the same   Orders Placed This Encounter  Procedures  . CT ABDOMEN PELVIS W CONTRAST    Standing Status:   Future    Standing Expiration Date:   03/15/2019    Order Specific Question:   If indicated for the ordered procedure, I authorize the administration of contrast media per Radiology protocol    Answer:   Yes    Order Specific Question:   Preferred imaging location?    Answer:   [facility]    Order Specific  Question:   Radiology Contrast Protocol - do NOT remove file path    Answer:   [email or web address]    Order Specific Question:   Is patient pregnant?    Answer:   No    INTERVAL HISTORY: Please see below for problem oriented charting. She returns with her husband for chemotherapy and follow-up She tolerated last cycle of chemo better with dose adjustment and aggressive supportive care She continues to have intermittent low grade fever and sinus congestion/drainage, resolved with tylenol No chills, dysuria, and productive cough She has stable chronic nausea and diarrhea, well controlled with medications No worsening neuropathy Her arthritis pain and bone pain is stable with gabapentin and pain medications as needed Her rheumatologist is reducing her chronic prednisone  SUMMARY OF ONCOLOGIC HISTORY: Oncology History   MSI positive  Endometrial :endometrioid Ovarian: 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 Mixed attenuation mass emanates from the right adnexa measuring 10.8 x 8.0 cm very suspicious for right ovarian carcinoma. 2. Abnormality of the tail of the pancreas may be due to mild pancreatitis and small pseudocyst formation, but neoplasm cannot be excluded. 3. Small amount of ascites within abdomen and pelvis. 4. Small nonobstructing renal calculi bilaterally.     08/30/2017 Pathology Results    1. Uterus and cervix, with left fallopian tube - ENDOMETRIOID ADENOCARCINOMA, FIGO GRADE I, ARISING IN A BACKGROUND OF DIFFUSE COMPLEX ATYPICAL HYPERPLASIA. - CARCINOMA INVADES FOR OF DEPTH OF 0.2 CM WHERE THICKNESS  OF MYOMETRIAL WALL IS 2.1 CM. - ALL RESECTION MARGINS ARE NEGATIVE FOR CARCINOMA. - NEGATIVE FOR LYMPHOVASCULAR OR PERINEURAL INVASION. - CERVICAL STROMA IS NOT INVOLVED. - SEE ONCOLOGY TABLE. - SEE NOTE 2. Ovary and fallopian tube, right -  PRIMARY OVARIAN ENDOMETRIOID ADENOCARCINOMA, FIGO GRADE II, 12 CM. - THE OVARIAN SURFACE IS FOCALLY INVOLVED BY CARCINOMA. - NEGATIVE FOR LYMPHOVASCULAR INVASION. - BENIGN UNREMARKABLE FALLOPIAN TUBE, NEGATIVE FOR CARCINOMA. - SEE ONCOLOGY TABLE. - SEE NOTE 3. Cul-de-sac biopsy - METASTATIC ADENOCARCINOMA, MOST CONSISTENT WITH PRIMARY OVARIAN ENDOMETRIOID ADENOCARCINOMA. 4. Ovary, left - BENIGN UNREMARKABLE OVARY, NEGATIVE FOR MALIGNANCY. Microscopic Comment 1. UTERUS, CARCINOMA OR CARCINOSARCOMA Procedure: Total hysterectomy with bilateral salpingo-oophorectomy. Histologic type: Endometrioid adenocarcinoma. Histologic Grade: FIGO Grade I Myometrial invasion: Depth of invasion: 2 mm Myometrial thickness: 21 mm Uterine Serosa Involvement: Not identified Cervical stromal involvement: Not identified Extent of involvement of other organs: Not applicable Lymphovascular invasion: Not identified Regional Lymph Nodes: Examined: 0 Sentinel 0 Non-sentinel 0 Total Tumor block for ancillary studies: 1H MMR / MSI testing: Pending Pathologic Stage Classification (pTNM, AJCC 8th edition): pT1a, pNX (v4.1.0.0) 2. OVARY or FALLOPIAN TUBE or PRIMARY PERITONEUM: Procedure: Salpingo-oophorectomy Specimen Integrity: Intact Tumor Site: Right ovary Ovarian Surface Involvement (required only if applicable): Focally involved by carcinoma Fallopian Tube Surface Involvement (required only if applicable): Not identified Tumor Size: 12 cm Histologic Type: Endometrioid adenocarcinoma Histologic Grade: Grade II Implants (required for advanced stage serous/seromucinous borderline tumors only): Not applicable Other Tissue/ Organ Involvement: Cul de sac biopsy involved by tumor Largest Extrapelvic Peritoneal Focus (required only if applicable): Not applicable Peritoneal/Ascitic Fluid: Negative for carcinoma (case # [id number]) Treatment Effect (required only for high-grade serous carcinomas): Not  applicable Regional Lymph Nodes: No lymph nodes submitted or found Number of Lymph Nodes Examined: 0 Pathologic Stage Classification (pTNM, AJCC 8th Edition): pT2b, pN0 Representative Tumor Block: 2B and 2E 1. Molecular study for microsatellite instability and immunohistochemical stains for MMR-related proteins are pending and will be reported in an addendum. 2. Immunohistochemical stain show that the ovarian tumor is positive for CK7 and PAX8 (both diffuse), CDX2 (patchy and weak); and negative for CK20. This immunoprofile is consistent with the above diagnosis. Dr. Lyndon Code has reviewed this case and concurs with the above diagnosis. Molecular study for microsatellite instability and immunohistochemical stains for MMR-related proteins are pending and will be reported in an addendum    08/30/2017 Genetic Testing    Patient has genetic testing done for MSI  Results revealed patient has the following mutation(s): loss of [facility] 2    08/30/2017 Surgery    Surgeon: Mart Piggs, MD Pre-operative Diagnosis:  1. Adnexal mass 2. Abnormal uterine bleeding 3. H/o Cecal CA  Post-operative Diagnosis:  1. Adhesive disease post colon resection 2. Endometrial cancer NOS 3. Adenocarcinoma unknown origin, right ovary, suspicious for GI primary  Operation:  1. Lysis of adhesions ~30 minutes 2. Robotic-assisted laparoscopic total hysterectomy with right salpingo-oophorectomy and left salpingectomy 3. Left oophorectomy (RA-laparoscopic) 4. Pelvic washings  Findings: Adhesions of omentum to anterior abdominal wall. Enlarged cystic right ovary ~10cm. Uterus had small nodules on serosa near where right adnexa was intimate with the surface. No obvious intraoperative rupture of cyst, although in 2 areas the wall was thin and one of these areas had some bleeding. Slight scarring of left bladder dome to LUS/cervix. Uterus on frozen section c/w hyperplasia and a small focus of endometrial CA - no myo invasion,  <2cm in size. Frozen section on the right adnexa was carcinoma,  met from colon or possibly Gyn, favor GI primary, defer to permanent. Left ovary was WNL.      09/15/2017 Cancer Staging    Staging form: Corpus Uteri - Carcinoma and Carcinosarcoma, AJCC 8th Edition - Pathologic: FIGO Stage IA (pT1a, pN0, cM0) - Signed by Heath Lark, MD on 09/15/2017    09/26/2017 Procedure    Successful placement of a right internal jugular approach power injectable Port-A-Cath. The catheter is ready for immediate use.    11/07/2017 Imaging    1. Since 08/18/2017, similar to slight decrease in size of a pancreatic body/tail junction lesion. Cross modality comparison relative to 09/16/2017 MRI is also grossly similar. 2. No evidence of metastatic disease. 3. Aortic Atherosclerosis (ICD10-I70.0).  4. Left nephrolithiasis.    11/18/2017 Genetic Testing    MSH2 c.2237dupT pathogenic mutation identified in the CancerNext panel.  The CancerNext gene panel offered by [employer] includes sequencing and rearrangement analysis for the following 34 genes:   APC, ATM, BARD1, BMPR1A, BRCA1, BRCA2, BRIP1, CDH1, CDK4, CDKN2A, CHEK2, DICER1, HOXB13, EPCAM, GREM1, MLH1, MRE11A, MSH2, MSH6, MUTYH, NBN, NF1, PALB2, PMS2, POLD1, POLE, PTEN, RAD50, RAD51C, RAD51D, SMAD4, SMARCA4, STK11, and TP53.  The report date is November 18, 2017.  MSH2 [id number] pathogenic mutation identified on somatic testing.  These results are consistent with a diagnosis of Lynch syndrome.     Secondary malignant neoplasm of right ovary ([place])   08/23/2017 Tumor Marker    Patient's tumor was tested for the following markers: CA-125 Results of the tumor marker test revealed 139.8    08/31/2017 Initial Diagnosis    Secondary malignant neoplasm of right ovary ([place])    09/15/2017 Cancer Staging    Staging form: Ovary, Fallopian Tube, and Primary Peritoneal Carcinoma, AJCC 8th Edition - Pathologic: Stage IIB (pT2b, pN0, cM0) - Signed by  Heath Lark, MD on 09/15/2017    09/27/2017 Imaging    No evidence of metastatic disease or other acute findings within the thorax.  4 cm low-attenuation mass in pancreatic tail, highly suspicious for pancreatic carcinoma. This is caused splenic vein thrombosis, with new venous collaterals in the left upper quadrant. Consider endoscopic ultrasound with FNA for tissue diagnosis.  Stable benign hepatic hemangioma.     09/27/2017 Tumor Marker    Patient's tumor was tested for the following markers: CA-125 Results of the tumor marker test revealed 39.4    11/02/2017 Tumor Marker    Patient's tumor was tested for the following markers: CA-125 Results of the tumor marker test revealed 19    11/07/2017 Tumor Marker    Patient's tumor was tested for the following markers: CA-125 Results of the tumor marker test revealed 18.3    11/18/2017 Genetic Testing    MSH2 c.2237dupT pathogenic mutation identified in the CancerNext panel.  The CancerNext gene panel offered by [employer] includes sequencing and rearrangement analysis for the following 34 genes:   APC, ATM, BARD1, BMPR1A, BRCA1, BRCA2, BRIP1, CDH1, CDK4, CDKN2A, CHEK2, DICER1, HOXB13, EPCAM, GREM1, MLH1, MRE11A, MSH2, MSH6, MUTYH, NBN, NF1, PALB2, PMS2, POLD1, POLE, PTEN, RAD50, RAD51C, RAD51D, SMAD4, SMARCA4, STK11, and TP53.  The report date is November 18, 2017.  MSH2 [id number] pathogenic mutation identified on somatic testing.  These results are consistent with a diagnosis of Lynch syndrome.    12/15/2017 Tumor Marker    Patient's tumor was tested for the following markers: CA-125 Results of the tumor marker test revealed 57.3    01/16/2018 Tumor Marker    Patient's tumor was tested  for the following markers: CA-125 Results of the tumor marker test revealed 24.2     Pancreatic cancer ([place])   10/13/2017 Pathology Results    Pancreas tail mass, endoscopic ultrasound-guided, fine needle aspiration II (smears and cell  block): Adenocarcinoma    11/18/2017 Genetic Testing    MSH2 c.2237dupT pathogenic mutation identified in the CancerNext panel.  The CancerNext gene panel offered by [employer] includes sequencing and rearrangement analysis for the following 34 genes:   APC, ATM, BARD1, BMPR1A, BRCA1, BRCA2, BRIP1, CDH1, CDK4, CDKN2A, CHEK2, DICER1, HOXB13, EPCAM, GREM1, MLH1, MRE11A, MSH2, MSH6, MUTYH, NBN, NF1, PALB2, PMS2, POLD1, POLE, PTEN, RAD50, RAD51C, RAD51D, SMAD4, SMARCA4, STK11, and TP53.  The report date is November 18, 2017.  MSH2 [id number] pathogenic mutation identified on somatic testing.  These results are consistent with a diagnosis of Lynch syndrome.    11/24/2017 Pathology Results    A. "TAIL OF PANCREAS AND SPLEEN", DISTAL PANCREATECTOMY AND SPLENECTOMY: Invasive ductal adenocarcinoma, moderately to poorly differentiated with focal signet ring cell features, of pancreas (distal).   The carcinoma is 2.5 cm in greatest dimension grossly.   Treatment effects present in the form of fibrosis (50%). No lymphovascular or definite perineural invasion identified. All surgical margins are negative for tumor or high-grade dysplasia. Adjacent mucinous neoplasm, most consistent with Intraductal papillary mucinous neoplasm (IPMN) with low-grade dysplasia.   Uninvolved pancreas show atrophy and focal acute inflammation.   Twelve benign lymph nodes (0/12). Spleen with no significant histopathologic abnormalities.  PROCEDURE: distal pancreatectomy and splenectomy TUMOR SITE: distal pancreas TUMOR SIZE:  GREATEST DIMENSION: 2.5 cm  ADDITIONAL DIMENSIONS:  x  cm HISTOLOGIC TYPE: ductal adenocarcinoma HISTOLOGIC GRADE: grade 3 TUMOR EXTENSION: peripancreatic soft tissue MARGINS: negative for tumor TREATMENT EFFECT: treatment effects present in the form of fibrosis (50%). LYMPHOVASCULAR INVASION: not identified PERINEURAL  INVASION: no definite evidence REGIONAL LYMPH NODES:   NUMBER OF LYMPH NODES INVOLVED: 0   NUMBER OF LYMPH NODES EXAMINED: 12 PATHOLOGIC STAGE CLASSIFICATION (pTNM, AJCC 8th Ed): ypT2, ypN0 DISTANT METASTASIS (pM): pMx ADDITIONAL PATHOLOGIC FINDINGS: mucinous neoplasm, most consistent with intraductal papillary mucinous neoplasm (IPMN) with low-grade dysplasia, is identified adjacent to the main tumor.    11/24/2017 Cancer Staging    Staging form: Exocrine Pancreas, AJCC 8th Edition - Pathologic stage from 11/24/2017: Stage IB (pT2, pN0, cM0) - Signed by Truitt Merle, MD on 01/03/2018    11/25/2017 Surgery    She had surgery at [facility] 1. Exploratory Laparotomy 2. Distal Pancreatectomy and Splenectomy 3. Intraoperative Ultrasound 4. Open Cholecystectomy     12/15/2017 Cancer Staging    Staging form: Exocrine Pancreas, AJCC 8th Edition - Clinical: Stage IB (cT2, cN0, cM0) - Signed by Heath Lark, MD on 12/15/2017    12/28/2017 Imaging    12/28/2017 CT Abdomen  IMPRESSION: 1. Postoperative findings from recent partial pancreatectomy including a 21 cubic cm fluid collection along the pancreatic resection margin which could represent early pseudocyst. 2. Nodularity along the lateral limb of the left adrenal gland could also be postoperative but merit surveillance, as the pancreatic lesion was in close proximity to this adrenal gland on the prior CT of 11/07/2017. 3. Asymmetric fullness inferiorly in the left breast. The patient has a history of prior left breast procedures, correlation with mammography is recommended. 4. Other imaging findings of potential clinical significance: Stable hemangioma in the left hepatic lobe. Splenectomy. Aortic Atherosclerosis (ICD10-I70.0). Right hemicolectomy. Small focus of fat necrosis in the right anterior abdominal wall subcutaneous tissues near the laparotomy  site. Bilateral nonobstructive nephrolithiasis.    01/02/2018 Tumor Marker    Patient's  tumor was tested for the following markers: CA-19-9 Results of the tumor marker test revealed 5    01/03/2018 -  Chemotherapy    She received modified dose FOLFIRINOX     REVIEW OF SYSTEMS:   Constitutional: Denies fevers, chills or abnormal weight loss Eyes: Denies blurriness of vision Ears, nose, mouth, throat, and face: Denies mucositis or sore throat Respiratory: Denies cough, dyspnea or wheezes Cardiovascular: Denies palpitation, chest discomfort or lower extremity swelling Skin: Denies abnormal skin rashes Lymphatics: Denies new lymphadenopathy or easy bruising Behavioral/Psych: Mood is stable, no new changes  All other systems were reviewed with the patient and are negative.  I have reviewed the past medical history, past surgical history, social history and family history with the patient and they are unchanged from previous note.  ALLERGIES:  is allergic to codeine; penicillins; and bactrim [sulfamethoxazole-trimethoprim].  MEDICATIONS:  Current Outpatient Medications  Medication Sig Dispense Refill  . acetaminophen (TYLENOL) 650 MG CR tablet Take 1,300 mg by mouth every 8 (eight) hours.    Marland Kitchen dexamethasone (DECADRON) 4 MG tablet Take 1 tablet (4 mg total) by mouth 2 (two) times daily with a meal. 60 tablet 1  . diclofenac sodium (VOLTAREN) 1 % GEL APPLY 3 GM TO AFFECTED 3 LARGE JOINTS UP TO 3 TIMES A DAY AS NEEDED AS DIRECTED 300 g 1  . diphenoxylate-atropine (LOMOTIL) 2.5-0.025 MG tablet Take 1-2 tablets by mouth 4 (four) times daily as needed for diarrhea or loose stools. 1 to 2 po QID prn diarrhea 60 tablet 1  . gabapentin (NEURONTIN) 300 MG capsule TAKE 1 CAPSULE BY MOUTH THREE TIMES A DAY 270 capsule 1  . HYDROmorphone (DILAUDID) 4 MG tablet Take 1 tablet (4 mg total) by mouth every 4 (four) hours as needed for severe pain. 30 tablet 0  . hydrOXYzine (ATARAX/VISTARIL) 10 MG tablet Take 1 tablet (10 mg total) by mouth 3 (three) times daily as needed for itching. 30 tablet  0  . lidocaine-prilocaine (EMLA) cream Apply to affected area once (Patient taking differently: Apply 1 application topically daily as needed (port). Apply to affected area once) 30 g 3  . loratadine (CLARITIN) 10 MG tablet Take 10 mg by mouth daily after breakfast.    . LORazepam (ATIVAN) 0.5 MG tablet Place 1 tablet (0.5 mg total) under the tongue every 6 (six) hours as needed for anxiety. 60 tablet 1  . metFORMIN (GLUCOPHAGE) 500 MG tablet Take 1 tablet (500 mg total) by mouth 2 (two) times daily with a meal. 60 tablet 1  . naproxen sodium (ALEVE) 220 MG tablet Take 220 mg by mouth.    Marland Kitchen omeprazole (PRILOSEC) 20 MG capsule Take 20 mg by mouth 2 (two) times daily before a meal.     . ondansetron (ZOFRAN) 8 MG tablet Take 1 tablet (8 mg total) by mouth every 8 (eight) hours as needed for refractory nausea / vomiting. Start on day 3 after chemo. 30 tablet 1  . predniSONE (DELTASONE) 5 MG tablet Take 1 tablet (5 mg total) by mouth daily with breakfast. 30 tablet 0  . prochlorperazine (COMPAZINE) 10 MG tablet Take 1 tablet (10 mg total) by mouth every 6 (six) hours as needed (Nausea or vomiting). 90 tablet 1  . simethicone (MYLICON) 937 MG chewable tablet Chew 125 mg by mouth every 6 (six) hours as needed for flatulence.    . triamcinolone (NASACORT) 55 MCG/ACT AERO  nasal inhaler Place 2 sprays into the nose daily. 1 Inhaler 12   No current facility-administered medications for this visit.    Facility-Administered Medications Ordered in Other Visits  Medication Dose Route Frequency Provider Last Rate Last Dose  . fluorouracil (ADRUCIL) 5,000 mg in sodium chloride 0.9 % 150 mL chemo infusion  2,425 mg/m2 (Order-Specific) Intravenous 1 day or 1 dose Miosha Behe, MD      . irinotecan (CAMPTOSAR) 240 mg in dextrose 5 % 500 mL chemo infusion  112.5 mg/m2 (Order-Specific) Intravenous Once Alvy Bimler, Bran Aldridge, MD 171 mL/hr at 03/14/18 1438 240 mg at 03/14/18 1438  . leucovorin 828 mg in dextrose 5 % 250 mL  infusion  400 mg/m2 (Order-Specific) Intravenous Once Alvy Bimler, Jasha Hodzic, MD 97 mL/hr at 03/14/18 1435 828 mg at 03/14/18 1435    PHYSICAL EXAMINATION: ECOG PERFORMANCE STATUS: 1 - Symptomatic but completely ambulatory  Vitals:   03/14/18 0931  BP: (!) 154/86  Pulse: (!) 113  Resp: 20  Temp: 97.7 F (36.5 C)  SpO2: 96%   Filed Weights   03/14/18 0931  Weight: 214 lb 6.4 oz (97.3 kg)    GENERAL:alert, no distress and comfortable. She looks mildly Cushingoid SKIN: skin color, texture, turgor are normal, no rashes or significant lesions EYES: normal, Conjunctiva are pink and non-injected, sclera clear OROPHARYNX:no exudate, no erythema and lips, buccal mucosa, and tongue normal  NECK: supple, thyroid normal size, non-tender, without nodularity LYMPH:  no palpable lymphadenopathy in the cervical, axillary or inguinal LUNGS: clear to auscultation and percussion with normal breathing effort HEART: regular rate & rhythm and no murmurs and no lower extremity edema ABDOMEN:abdomen soft, non-tender and normal bowel sounds Musculoskeletal:no cyanosis of digits and no clubbing  NEURO: alert & oriented x 3 with fluent speech, no focal motor/sensory deficits  LABORATORY DATA:  I have reviewed the data as listed    Component Value Date/Time   NA 140 03/14/2018 0906   K 4.1 03/14/2018 0906   CL 107 03/14/2018 0906   CO2 20 (L) 03/14/2018 0906   GLUCOSE 210 (H) 03/14/2018 0906   BUN 7 03/14/2018 0906   CREATININE 0.65 03/14/2018 0906   CREATININE 0.63 02/06/2018 1001   CREATININE 0.68 07/27/2017 0851   CALCIUM 9.7 03/14/2018 0906   PROT 7.5 03/14/2018 0906   ALBUMIN 3.4 (L) 03/14/2018 0906   AST 30 03/14/2018 0906   AST 20 02/06/2018 1001   ALT 58 (H) 03/14/2018 0906   ALT 46 (H) 02/06/2018 1001   ALKPHOS 131 (H) 03/14/2018 0906   BILITOT <0.2 (L) 03/14/2018 0906   BILITOT <0.2 (L) 02/06/2018 1001   GFRNONAA >60 03/14/2018 0906   GFRNONAA >60 02/06/2018 1001   GFRNONAA 106 07/27/2017  0851   GFRAA >60 03/14/2018 0906   GFRAA >60 02/06/2018 1001   GFRAA 123 07/27/2017 0851    No results found for: SPEP, UPEP  Lab Results  Component Value Date   WBC 26.6 (H) 03/14/2018   NEUTROABS 21.0 (H) 03/14/2018   HGB 11.9 (L) 03/14/2018   HCT 38.8 03/14/2018   MCV 92.8 03/14/2018   PLT 405 (H) 03/14/2018      Chemistry      Component Value Date/Time   NA 140 03/14/2018 0906   K 4.1 03/14/2018 0906   CL 107 03/14/2018 0906   CO2 20 (L) 03/14/2018 0906   BUN 7 03/14/2018 0906   CREATININE 0.65 03/14/2018 0906   CREATININE 0.63 02/06/2018 1001   CREATININE 0.68 07/27/2017 0851  Component Value Date/Time   CALCIUM 9.7 03/14/2018 0906   ALKPHOS 131 (H) 03/14/2018 0906   AST 30 03/14/2018 0906   AST 20 02/06/2018 1001   ALT 58 (H) 03/14/2018 0906   ALT 46 (H) 02/06/2018 1001   BILITOT <0.2 (L) 03/14/2018 0906   BILITOT <0.2 (L) 02/06/2018 1001       RADIOGRAPHIC STUDIES: I have personally reviewed the radiological images as listed and agreed with the findings in the report. Dg Chest 2 View  Result Date: 02/14/2018 CLINICAL DATA:  History of pancreatic carcinoma.  Cough EXAM: CHEST - 2 VIEW COMPARISON:  November 02, 2017 FINDINGS: Port-A-Cath tip is at the cavoatrial junction. No pneumothorax. The lungs are clear. The heart size and pulmonary vascularity are normal. No adenopathy. No bone lesions. IMPRESSION: No edema or consolidation.  No neoplastic focus demonstrable. Electronically Signed   By: Lowella Grip III M.D.   On: 02/14/2018 10:15    All questions were answered. The patient knows to call the clinic with any problems, questions or concerns. No barriers to learning was detected.  I spent 25 minutes counseling the patient face to face. The total time spent in the appointment was 30 minutes and more than 50% was on counseling and review of test results  Heath Lark, MD 03/14/2018 3:36 PM

## 2018-03-14 NOTE — Assessment & Plan Note (Signed)
We discussed aggressive management with dietary modifications I recommend trial of oral metformin

## 2018-03-15 ENCOUNTER — Encounter: Payer: Self-pay | Admitting: Hematology and Oncology

## 2018-03-16 ENCOUNTER — Inpatient Hospital Stay: Payer: BLUE CROSS/BLUE SHIELD

## 2018-03-16 VITALS — BP 128/81 | HR 91 | Temp 98.2°F | Resp 18

## 2018-03-16 DIAGNOSIS — E099 Drug or chemical induced diabetes mellitus without complications: Secondary | ICD-10-CM | POA: Diagnosis not present

## 2018-03-16 DIAGNOSIS — G620 Drug-induced polyneuropathy: Secondary | ICD-10-CM | POA: Diagnosis not present

## 2018-03-16 DIAGNOSIS — C7961 Secondary malignant neoplasm of right ovary: Secondary | ICD-10-CM | POA: Diagnosis not present

## 2018-03-16 DIAGNOSIS — R197 Diarrhea, unspecified: Secondary | ICD-10-CM | POA: Diagnosis not present

## 2018-03-16 DIAGNOSIS — R110 Nausea: Secondary | ICD-10-CM | POA: Diagnosis not present

## 2018-03-16 DIAGNOSIS — C252 Malignant neoplasm of tail of pancreas: Secondary | ICD-10-CM

## 2018-03-16 DIAGNOSIS — Z79899 Other long term (current) drug therapy: Secondary | ICD-10-CM | POA: Diagnosis not present

## 2018-03-16 DIAGNOSIS — Z5111 Encounter for antineoplastic chemotherapy: Secondary | ICD-10-CM | POA: Diagnosis not present

## 2018-03-16 DIAGNOSIS — C541 Malignant neoplasm of endometrium: Secondary | ICD-10-CM

## 2018-03-16 DIAGNOSIS — R748 Abnormal levels of other serum enzymes: Secondary | ICD-10-CM | POA: Diagnosis not present

## 2018-03-16 DIAGNOSIS — J00 Acute nasopharyngitis [common cold]: Secondary | ICD-10-CM | POA: Diagnosis not present

## 2018-03-16 DIAGNOSIS — Z1509 Genetic susceptibility to other malignant neoplasm: Secondary | ICD-10-CM | POA: Diagnosis not present

## 2018-03-16 MED ORDER — PEGFILGRASTIM INJECTION 6 MG/0.6ML ~~LOC~~
6.0000 mg | PREFILLED_SYRINGE | Freq: Once | SUBCUTANEOUS | Status: AC
  Administered 2018-03-16: 6 mg via SUBCUTANEOUS

## 2018-03-16 MED ORDER — HEPARIN SOD (PORK) LOCK FLUSH 100 UNIT/ML IV SOLN
500.0000 [IU] | Freq: Once | INTRAVENOUS | Status: AC | PRN
  Administered 2018-03-16: 500 [IU]
  Filled 2018-03-16: qty 5

## 2018-03-16 MED ORDER — PEGFILGRASTIM INJECTION 6 MG/0.6ML ~~LOC~~
PREFILLED_SYRINGE | SUBCUTANEOUS | Status: AC
  Filled 2018-03-16: qty 0.6

## 2018-03-16 MED ORDER — SODIUM CHLORIDE 0.9% FLUSH
10.0000 mL | INTRAVENOUS | Status: DC | PRN
  Administered 2018-03-16: 10 mL
  Filled 2018-03-16: qty 10

## 2018-03-16 NOTE — Patient Instructions (Signed)
Pegfilgrastim injection What is this medicine? PEGFILGRASTIM (PEG fil gra stim) is a long-acting granulocyte colony-stimulating factor that stimulates the growth of neutrophils, a type of white blood cell important in the body's fight against infection. It is used to reduce the incidence of fever and infection in patients with certain types of cancer who are receiving chemotherapy that affects the bone marrow, and to increase survival after being exposed to high doses of radiation. This medicine may be used for other purposes; ask your health care provider or pharmacist if you have questions. COMMON BRAND NAME(S): Neulasta What should I tell my health care provider before I take this medicine? They need to know if you have any of these conditions: -kidney disease -latex allergy -ongoing radiation therapy -sickle cell disease -skin reactions to acrylic adhesives (On-Body Injector only) -an unusual or allergic reaction to pegfilgrastim, filgrastim, other medicines, foods, dyes, or preservatives -pregnant or trying to get pregnant -breast-feeding How should I use this medicine? This medicine is for injection under the skin. If you get this medicine at home, you will be taught how to prepare and give the pre-filled syringe or how to use the On-body Injector. Refer to the patient Instructions for Use for detailed instructions. Use exactly as directed. Tell your healthcare provider immediately if you suspect that the On-body Injector may not have performed as intended or if you suspect the use of the On-body Injector resulted in a missed or partial dose. It is important that you put your used needles and syringes in a special sharps container. Do not put them in a trash can. If you do not have a sharps container, call your pharmacist or healthcare provider to get one. Talk to your pediatrician regarding the use of this medicine in children. While this drug may be prescribed for selected conditions,  precautions do apply. Overdosage: If you think you have taken too much of this medicine contact a poison control center or emergency room at once. NOTE: This medicine is only for you. Do not share this medicine with others. What if I miss a dose? It is important not to miss your dose. Call your doctor or health care professional if you miss your dose. If you miss a dose due to an On-body Injector failure or leakage, a new dose should be administered as soon as possible using a single prefilled syringe for manual use. What may interact with this medicine? Interactions have not been studied. Give your health care provider a list of all the medicines, herbs, non-prescription drugs, or dietary supplements you use. Also tell them if you smoke, drink alcohol, or use illegal drugs. Some items may interact with your medicine. This list may not describe all possible interactions. Give your health care provider a list of all the medicines, herbs, non-prescription drugs, or dietary supplements you use. Also tell them if you smoke, drink alcohol, or use illegal drugs. Some items may interact with your medicine. What should I watch for while using this medicine? You may need blood work done while you are taking this medicine. If you are going to need a MRI, CT scan, or other procedure, tell your doctor that you are using this medicine (On-Body Injector only). What side effects may I notice from receiving this medicine? Side effects that you should report to your doctor or health care professional as soon as possible: -allergic reactions like skin rash, itching or hives, swelling of the face, lips, or tongue -dizziness -fever -pain, redness, or irritation at site   where injected -pinpoint red spots on the skin -red or dark-brown urine -shortness of breath or breathing problems -stomach or side pain, or pain at the shoulder -swelling -tiredness -trouble passing urine or change in the amount of urine Side  effects that usually do not require medical attention (report to your doctor or health care professional if they continue or are bothersome): -bone pain -muscle pain This list may not describe all possible side effects. Call your doctor for medical advice about side effects. You may report side effects to FDA at 1-800-FDA-1088. Where should I keep my medicine? Keep out of the reach of children. Store pre-filled syringes in a refrigerator between 2 and 8 degrees C (36 and 46 degrees F). Do not freeze. Keep in carton to protect from light. Throw away this medicine if it is left out of the refrigerator for more than 48 hours. Throw away any unused medicine after the expiration date. NOTE: This sheet is a summary. It may not cover all possible information. If you have questions about this medicine, talk to your doctor, pharmacist, or health care provider.  2018 [employer] Standard (2016-02-05 12:58:03)  

## 2018-03-20 DIAGNOSIS — C252 Malignant neoplasm of tail of pancreas: Secondary | ICD-10-CM | POA: Diagnosis not present

## 2018-03-28 ENCOUNTER — Inpatient Hospital Stay (HOSPITAL_BASED_OUTPATIENT_CLINIC_OR_DEPARTMENT_OTHER): Payer: BLUE CROSS/BLUE SHIELD | Admitting: Hematology and Oncology

## 2018-03-28 ENCOUNTER — Inpatient Hospital Stay: Payer: BLUE CROSS/BLUE SHIELD

## 2018-03-28 ENCOUNTER — Inpatient Hospital Stay: Payer: BLUE CROSS/BLUE SHIELD | Attending: Hematology and Oncology

## 2018-03-28 ENCOUNTER — Other Ambulatory Visit: Payer: Self-pay | Admitting: Hematology and Oncology

## 2018-03-28 ENCOUNTER — Encounter: Payer: Self-pay | Admitting: Hematology and Oncology

## 2018-03-28 ENCOUNTER — Telehealth: Payer: Self-pay | Admitting: Hematology and Oncology

## 2018-03-28 VITALS — BP 132/96 | HR 86

## 2018-03-28 DIAGNOSIS — C7961 Secondary malignant neoplasm of right ovary: Secondary | ICD-10-CM

## 2018-03-28 DIAGNOSIS — Z90411 Acquired partial absence of pancreas: Secondary | ICD-10-CM | POA: Insufficient documentation

## 2018-03-28 DIAGNOSIS — Z79899 Other long term (current) drug therapy: Secondary | ICD-10-CM | POA: Insufficient documentation

## 2018-03-28 DIAGNOSIS — G620 Drug-induced polyneuropathy: Secondary | ICD-10-CM

## 2018-03-28 DIAGNOSIS — Z90722 Acquired absence of ovaries, bilateral: Secondary | ICD-10-CM | POA: Diagnosis not present

## 2018-03-28 DIAGNOSIS — Z7901 Long term (current) use of anticoagulants: Secondary | ICD-10-CM | POA: Diagnosis not present

## 2018-03-28 DIAGNOSIS — C541 Malignant neoplasm of endometrium: Secondary | ICD-10-CM

## 2018-03-28 DIAGNOSIS — I2699 Other pulmonary embolism without acute cor pulmonale: Secondary | ICD-10-CM | POA: Insufficient documentation

## 2018-03-28 DIAGNOSIS — Z90710 Acquired absence of both cervix and uterus: Secondary | ICD-10-CM | POA: Insufficient documentation

## 2018-03-28 DIAGNOSIS — Z5189 Encounter for other specified aftercare: Secondary | ICD-10-CM | POA: Diagnosis not present

## 2018-03-28 DIAGNOSIS — C252 Malignant neoplasm of tail of pancreas: Secondary | ICD-10-CM | POA: Diagnosis not present

## 2018-03-28 DIAGNOSIS — Z7984 Long term (current) use of oral hypoglycemic drugs: Secondary | ICD-10-CM | POA: Insufficient documentation

## 2018-03-28 DIAGNOSIS — R110 Nausea: Secondary | ICD-10-CM | POA: Insufficient documentation

## 2018-03-28 DIAGNOSIS — Z5111 Encounter for antineoplastic chemotherapy: Secondary | ICD-10-CM | POA: Insufficient documentation

## 2018-03-28 DIAGNOSIS — Z1509 Genetic susceptibility to other malignant neoplasm: Secondary | ICD-10-CM | POA: Diagnosis not present

## 2018-03-28 DIAGNOSIS — T451X5A Adverse effect of antineoplastic and immunosuppressive drugs, initial encounter: Secondary | ICD-10-CM | POA: Insufficient documentation

## 2018-03-28 DIAGNOSIS — K521 Toxic gastroenteritis and colitis: Secondary | ICD-10-CM

## 2018-03-28 DIAGNOSIS — M898X9 Other specified disorders of bone, unspecified site: Secondary | ICD-10-CM

## 2018-03-28 DIAGNOSIS — T380X5A Adverse effect of glucocorticoids and synthetic analogues, initial encounter: Secondary | ICD-10-CM | POA: Diagnosis not present

## 2018-03-28 DIAGNOSIS — R197 Diarrhea, unspecified: Secondary | ICD-10-CM | POA: Diagnosis not present

## 2018-03-28 DIAGNOSIS — N200 Calculus of kidney: Secondary | ICD-10-CM | POA: Insufficient documentation

## 2018-03-28 DIAGNOSIS — E860 Dehydration: Secondary | ICD-10-CM | POA: Diagnosis not present

## 2018-03-28 DIAGNOSIS — G8929 Other chronic pain: Secondary | ICD-10-CM | POA: Insufficient documentation

## 2018-03-28 DIAGNOSIS — E099 Drug or chemical induced diabetes mellitus without complications: Secondary | ICD-10-CM | POA: Diagnosis not present

## 2018-03-28 DIAGNOSIS — R748 Abnormal levels of other serum enzymes: Secondary | ICD-10-CM | POA: Diagnosis not present

## 2018-03-28 DIAGNOSIS — Z9079 Acquired absence of other genital organ(s): Secondary | ICD-10-CM | POA: Insufficient documentation

## 2018-03-28 DIAGNOSIS — Z9081 Acquired absence of spleen: Secondary | ICD-10-CM | POA: Insufficient documentation

## 2018-03-28 LAB — CBC WITH DIFFERENTIAL/PLATELET
Abs Immature Granulocytes: 4.33 10*3/uL — ABNORMAL HIGH (ref 0.00–0.07)
Basophils Absolute: 0.2 10*3/uL — ABNORMAL HIGH (ref 0.0–0.1)
Basophils Relative: 1 %
Eosinophils Absolute: 0 10*3/uL (ref 0.0–0.5)
Eosinophils Relative: 0 %
HCT: 36.8 % (ref 36.0–46.0)
Hemoglobin: 11.3 g/dL — ABNORMAL LOW (ref 12.0–15.0)
Immature Granulocytes: 13 %
Lymphocytes Relative: 10 %
Lymphs Abs: 3.3 10*3/uL (ref 0.7–4.0)
MCH: 28.7 pg (ref 26.0–34.0)
MCHC: 30.7 g/dL (ref 30.0–36.0)
MCV: 93.4 fL (ref 80.0–100.0)
Monocytes Absolute: 2 10*3/uL — ABNORMAL HIGH (ref 0.1–1.0)
Monocytes Relative: 6 %
Neutro Abs: 24.8 10*3/uL — ABNORMAL HIGH (ref 1.7–7.7)
Neutrophils Relative %: 70 %
Platelets: 269 10*3/uL (ref 150–400)
RBC: 3.94 MIL/uL (ref 3.87–5.11)
RDW: 22.9 % — ABNORMAL HIGH (ref 11.5–15.5)
WBC: 34.6 10*3/uL — ABNORMAL HIGH (ref 4.0–10.5)
nRBC: 2.1 % — ABNORMAL HIGH (ref 0.0–0.2)

## 2018-03-28 LAB — COMPREHENSIVE METABOLIC PANEL
ALT: 120 U/L — ABNORMAL HIGH (ref 0–44)
AST: 56 U/L — ABNORMAL HIGH (ref 15–41)
Albumin: 3.4 g/dL — ABNORMAL LOW (ref 3.5–5.0)
Alkaline Phosphatase: 172 U/L — ABNORMAL HIGH (ref 38–126)
Anion gap: 14 (ref 5–15)
BUN: 13 mg/dL (ref 6–20)
CO2: 21 mmol/L — ABNORMAL LOW (ref 22–32)
Calcium: 9.2 mg/dL (ref 8.9–10.3)
Chloride: 105 mmol/L (ref 98–111)
Creatinine, Ser: 0.71 mg/dL (ref 0.44–1.00)
GFR calc Af Amer: >60 mL/min (ref >60)
GFR calc non Af Amer: >60 mL/min (ref >60)
Glucose, Bld: 242 mg/dL — ABNORMAL HIGH (ref 70–99)
Potassium: 4.2 mmol/L (ref 3.5–5.1)
Sodium: 140 mmol/L (ref 135–145)
Total Bilirubin: 0.3 mg/dL (ref 0.3–1.2)
Total Protein: 7 g/dL (ref 6.5–8.1)

## 2018-03-28 LAB — MAGNESIUM: Magnesium: 1.7 mg/dL (ref 1.7–2.4)

## 2018-03-28 MED ORDER — PALONOSETRON HCL INJECTION 0.25 MG/5ML
INTRAVENOUS | Status: AC
  Filled 2018-03-28: qty 5

## 2018-03-28 MED ORDER — ATROPINE SULFATE 1 MG/ML IJ SOLN: 0.5000 mg | Freq: Once | INTRAMUSCULAR | Status: DC | PRN

## 2018-03-28 MED ORDER — ATROPINE SULFATE 0.4 MG/ML IJ SOLN
0.4000 mg | Freq: Once | INTRAMUSCULAR | Status: AC | PRN
  Administered 2018-03-28: 0.4 mg via INTRAVENOUS
  Filled 2018-03-28: qty 1

## 2018-03-28 MED ORDER — PALONOSETRON HCL INJECTION 0.25 MG/5ML
0.2500 mg | Freq: Once | INTRAVENOUS | Status: AC
  Administered 2018-03-28: 0.25 mg via INTRAVENOUS

## 2018-03-28 MED ORDER — DEXTROSE 5 % IV SOLN
Freq: Once | INTRAVENOUS | Status: AC
  Administered 2018-03-28: 11:00:00 via INTRAVENOUS
  Filled 2018-03-28: qty 250

## 2018-03-28 MED ORDER — SODIUM CHLORIDE 0.9 % IV SOLN
1920.0000 mg/m2 | INTRAVENOUS | Status: DC
  Administered 2018-03-28: 3950 mg via INTRAVENOUS
  Filled 2018-03-28: qty 79

## 2018-03-28 MED ORDER — DIPHENHYDRAMINE HCL 50 MG/ML IJ SOLN
INTRAMUSCULAR | Status: AC
  Filled 2018-03-28: qty 1

## 2018-03-28 MED ORDER — SODIUM CHLORIDE 0.9% FLUSH
10.0000 mL | Freq: Once | INTRAVENOUS | Status: AC
  Administered 2018-03-28: 10 mL
  Filled 2018-03-28: qty 10

## 2018-03-28 MED ORDER — SODIUM CHLORIDE 0.9 % IV SOLN
Freq: Once | INTRAVENOUS | Status: AC
  Administered 2018-03-28: 11:00:00 via INTRAVENOUS
  Filled 2018-03-28: qty 5

## 2018-03-28 MED ORDER — OXALIPLATIN CHEMO INJECTION 100 MG/20ML
150.0000 mg | Freq: Once | INTRAVENOUS | Status: AC
  Administered 2018-03-28: 150 mg via INTRAVENOUS
  Filled 2018-03-28: qty 10

## 2018-03-28 MED ORDER — FAMOTIDINE IN NACL 20-0.9 MG/50ML-% IV SOLN
20.0000 mg | Freq: Once | INTRAVENOUS | Status: AC
  Administered 2018-03-28: 20 mg via INTRAVENOUS

## 2018-03-28 MED ORDER — DIPHENHYDRAMINE HCL 50 MG/ML IJ SOLN
50.0000 mg | Freq: Once | INTRAMUSCULAR | Status: AC
  Administered 2018-03-28: 50 mg via INTRAVENOUS

## 2018-03-28 MED ORDER — FAMOTIDINE IN NACL 20-0.9 MG/50ML-% IV SOLN
INTRAVENOUS | Status: AC
  Filled 2018-03-28: qty 50

## 2018-03-28 MED ORDER — LEUCOVORIN CALCIUM INJECTION 350 MG
400.0000 mg/m2 | Freq: Once | INTRAVENOUS | Status: AC
  Administered 2018-03-28: 828 mg via INTRAVENOUS
  Filled 2018-03-28: qty 41.4

## 2018-03-28 MED ORDER — IRINOTECAN HCL CHEMO INJECTION 100 MG/5ML
100.0000 mg/m2 | Freq: Once | INTRAVENOUS | Status: AC
  Administered 2018-03-28: 200 mg via INTRAVENOUS
  Filled 2018-03-28: qty 10

## 2018-03-28 MED ORDER — HEPARIN SOD (PORK) LOCK FLUSH 100 UNIT/ML IV SOLN
500.0000 [IU] | Freq: Once | INTRAVENOUS | Status: DC
  Filled 2018-03-28: qty 5

## 2018-03-28 NOTE — Progress Notes (Signed)
Spoke w/ Dr. Alvy Bimler, okay to treat today in setting of elevated LFTs. Chemo doses have been adjusted.   Demetrius Charity, PharmD, [facility] Pharmac[facility]acy Phone: [phone] 03/28/2018

## 2018-03-28 NOTE — Patient Instructions (Signed)
[place] Discharge Instructions for Patients Receiving Chemotherapy  Today you received the following chemotherapy agents: Oxaliplatin (Eloxatin), Irinotecan (Camptosar), Leucovorin, Fluorouracil (Adrucil, 5-FU)  To help prevent nausea and vomiting after your treatment, we encourage you to take your nausea medication as directed.    If you develop nausea and vomiting that is not controlled by your nausea medication, call the clinic.   BELOW ARE SYMPTOMS THAT SHOULD BE REPORTED IMMEDIATELY:  *FEVER GREATER THAN 100.5 F  *CHILLS WITH OR WITHOUT FEVER  NAUSEA AND VOMITING THAT IS NOT CONTROLLED WITH YOUR NAUSEA MEDICATION  *UNUSUAL SHORTNESS OF BREATH  *UNUSUAL BRUISING OR BLEEDING  TENDERNESS IN MOUTH AND THROAT WITH OR WITHOUT PRESENCE OF ULCERS  *URINARY PROBLEMS  *BOWEL PROBLEMS  UNUSUAL RASH Items with * indicate a potential emergency and should be followed up as soon as possible.  Feel free to call the clinic should you have any questions or concerns. The clinic phone number is [phone].  Please show the [place] at check-in to the Emergency Department and triage nurse.

## 2018-03-28 NOTE — Assessment & Plan Note (Signed)
She has mildly elevated liver enzymes, likely due to recent chemotherapy.  Observe only for now I am planning to reduce dose of chemotherapy as above.  I discussed with the pharmacist about dose reduction

## 2018-03-28 NOTE — Assessment & Plan Note (Signed)
Her symptoms are better controlled with regular IV fluids, IV antiemetics and oral antiemetics as needed.  We will continue the same

## 2018-03-28 NOTE — Progress Notes (Signed)
[place] OFFICE PROGRESS NOTE  Patient Care Team: [name], MD as PCP - General (Family Medicine)  ASSESSMENT & PLAN:  Pancreatic cancer [facility]) She tolerated last treatment poorly with significant diarrhea and fatigue She is noted to have elevated liver enzymes and worsening peripheral neuropathy I recommend further dose adjustment of irinotecan, along with reduced dose 5-FU and oxaliplatin She is scheduled for CT imaging in 2 weeks for objective assessment of response to therapy I will see her back after that to review test results and to proceed with further treatment  Chemotherapy-induced nausea Her symptoms are better controlled with regular IV fluids, IV antiemetics and oral antiemetics as needed.  We will continue the same  Elevated liver enzymes She has mildly elevated liver enzymes, likely due to recent chemotherapy.  Observe only for now I am planning to reduce dose of chemotherapy as above.  I discussed with the pharmacist about dose reduction  Peripheral neuropathy due to chemotherapy [facility]) She has slight worsening neuropathy I plan to reduce the dose of oxaliplatin  Diarrhea due to drug This is related to side effects of chemo I plan further dose reduction of irinotecan and 5-FU  Bone pain Her bone pain and arthritis pain are stable with current treatment of steroids and pain medicine.  She will continue the same   No orders of the defined types were placed in this encounter.   INTERVAL HISTORY: Please see below for problem oriented charting. 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 Mixed attenuation mass emanates from the right adnexa measuring 10.8 x 8.0 cm very suspicious for right ovarian carcinoma. 2. Abnormality of the tail of the pancreas may be due to mild pancreatitis and small pseudocyst formation, but neoplasm cannot be excluded. 3. Small amount of ascites within abdomen and pelvis. 4. Small nonobstructing renal calculi bilaterally.     08/30/2017 Pathology Results    1. Uterus and cervix, with left fallopian tube - ENDOMETRIOID ADENOCARCINOMA, FIGO GRADE I, ARISING IN A BACKGROUND OF DIFFUSE COMPLEX ATYPICAL HYPERPLASIA. - CARCINOMA INVADES FOR OF DEPTH OF 0.2 CM WHERE THICKNESS OF MYOMETRIAL WALL IS 2.1 CM. - ALL RESECTION MARGINS ARE NEGATIVE FOR CARCINOMA. - NEGATIVE FOR LYMPHOVASCULAR OR PERINEURAL INVASION. - CERVICAL STROMA IS NOT INVOLVED. - SEE ONCOLOGY TABLE. - SEE NOTE 2. Ovary and fallopian tube, right - PRIMARY OVARIAN ENDOMETRIOID ADENOCARCINOMA, FIGO GRADE II, 12 CM. - THE OVARIAN SURFACE IS FOCALLY INVOLVED BY CARCINOMA. - NEGATIVE FOR LYMPHOVASCULAR INVASION. - BENIGN UNREMARKABLE FALLOPIAN TUBE, NEGATIVE FOR CARCINOMA. - SEE ONCOLOGY TABLE. - SEE NOTE 3. Cul-de-sac biopsy - METASTATIC ADENOCARCINOMA, MOST CONSISTENT WITH PRIMARY OVARIAN ENDOMETRIOID ADENOCARCINOMA. 4. Ovary, left - BENIGN UNREMARKABLE OVARY, NEGATIVE FOR MALIGNANCY. Microscopic Comment 1. UTERUS, CARCINOMA OR CARCINOSARCOMA Procedure: Total hysterectomy with bilateral salpingo-oophorectomy. Histologic type: Endometrioid adenocarcinoma. Histologic Grade: FIGO Grade I Myometrial invasion: Depth of invasion: 2 mm Myometrial thickness: 21 mm Uterine Serosa Involvement:  Not identified Cervical stromal involvement: Not identified Extent of  involvement of other organs: Not applicable Lymphovascular invasion: Not identified Regional Lymph Nodes: Examined: 0 Sentinel 0 Non-sentinel 0 Total Tumor block for ancillary studies: 1H MMR / MSI testing: Pending Pathologic Stage Classification (pTNM, AJCC 8th edition): pT1a, pNX (v4.1.0.0) 2. OVARY or FALLOPIAN TUBE or PRIMARY PERITONEUM: Procedure: Salpingo-oophorectomy Specimen Integrity: Intact Tumor Site: Right ovary Ovarian Surface Involvement (required only if applicable): Focally involved by carcinoma Fallopian Tube Surface Involvement (required only if applicable): Not identified Tumor Size: 12 cm Histologic Type: Endometrioid adenocarcinoma Histologic Grade: Grade II Implants (required for advanced stage serous/seromucinous borderline tumors only): Not applicable Other Tissue/ Organ Involvement: Cul de sac biopsy involved by tumor Largest Extrapelvic Peritoneal Focus (required only if applicable): Not applicable Peritoneal/Ascitic Fluid: Negative for carcinoma (case # [id number]) Treatment Effect (required only for high-grade serous carcinomas): Not applicable Regional Lymph Nodes: No lymph nodes submitted or found Number of Lymph Nodes Examined: 0 Pathologic Stage Classification (pTNM, AJCC 8th Edition): pT2b, pN0 Representative Tumor Block: 2B and 2E 1. Molecular study for microsatellite instability and immunohistochemical stains for MMR-related proteins are pending and will be reported in an addendum. 2. Immunohistochemical stain show that the ovarian tumor is positive for CK7 and PAX8 (both diffuse), CDX2 (patchy and weak); and negative for CK20. This immunoprofile is consistent with the above diagnosis. Dr. Lyndon Code has reviewed this case and concurs with the above diagnosis. Molecular study for microsatellite instability and immunohistochemical stains for MMR-related proteins are pending and will be reported in an addendum    08/30/2017 Genetic Testing    Patient has  genetic testing done for MSI  Results revealed patient has the following mutation(s): loss of [facility] 2    08/30/2017 Surgery    Surgeon: Mart Piggs, MD Pre-operative Diagnosis:  1. Adnexal mass 2. Abnormal uterine bleeding 3. H/o Cecal CA  Post-operative Diagnosis:  1. Adhesive disease post colon resection 2. Endometrial cancer NOS 3. Adenocarcinoma unknown origin, right ovary, suspicious for GI primary  Operation:  1. Lysis of adhesions ~30 minutes 2. Robotic-assisted laparoscopic total hysterectomy with right salpingo-oophorectomy and left salpingectomy 3. Left oophorectomy (RA-laparoscopic) 4. Pelvic washings  Findings: Adhesions of omentum to anterior abdominal wall. Enlarged cystic right ovary ~10cm. Uterus had small nodules on serosa near where right adnexa was intimate with the surface. No obvious intraoperative rupture of cyst, although in 2 areas the wall was thin and one of these areas had some bleeding. Slight scarring of left bladder dome to LUS/cervix. Uterus on frozen section c/w hyperplasia and a small focus of endometrial CA - no myo invasion, <2cm in size. Frozen section on the right adnexa was carcinoma, met from colon or possibly Gyn, favor GI primary, defer to permanent. Left ovary was WNL.      09/15/2017 Cancer Staging    Staging form: Corpus Uteri - Carcinoma and Carcinosarcoma, AJCC 8th Edition - Pathologic: FIGO Stage IA (pT1a, pN0, cM0) - Signed by Heath Lark, MD on 09/15/2017    09/26/2017 Procedure    Successful placement of a right internal jugular approach power injectable Port-A-Cath. The catheter is ready for immediate use.    11/07/2017 Imaging    1. Since 08/18/2017, similar to slight decrease in size of a pancreatic body/tail junction lesion. Cross modality comparison relative to 09/16/2017 MRI is also grossly similar. 2. No evidence of metastatic disease. 3. Aortic Atherosclerosis (ICD10-I70.0).  4. Left nephrolithiasis.    11/18/2017  Genetic Testing    MSH2  c.2237dupT pathogenic mutation identified in the CancerNext panel.  The CancerNext gene panel offered by [employer] includes sequencing and rearrangement analysis for the following 34 genes:   APC, ATM, BARD1, BMPR1A, BRCA1, BRCA2, BRIP1, CDH1, CDK4, CDKN2A, CHEK2, DICER1, HOXB13, EPCAM, GREM1, MLH1, MRE11A, MSH2, MSH6, MUTYH, NBN, NF1, PALB2, PMS2, POLD1, POLE, PTEN, RAD50, RAD51C, RAD51D, SMAD4, SMARCA4, STK11, and TP53.  The report date is November 18, 2017.  MSH2 [id number] pathogenic mutation identified on somatic testing.  These results are consistent with a diagnosis of Lynch syndrome.     Secondary malignant neoplasm of right ovary ([place])   08/23/2017 Tumor Marker    Patient's tumor was tested for the following markers: CA-125 Results of the tumor marker test revealed 139.8    08/31/2017 Initial Diagnosis    Secondary malignant neoplasm of right ovary ([place])    09/15/2017 Cancer Staging    Staging form: Ovary, Fallopian Tube, and Primary Peritoneal Carcinoma, AJCC 8th Edition - Pathologic: Stage IIB (pT2b, pN0, cM0) - Signed by Heath Lark, MD on 09/15/2017    09/27/2017 Imaging    No evidence of metastatic disease or other acute findings within the thorax.  4 cm low-attenuation mass in pancreatic tail, highly suspicious for pancreatic carcinoma. This is caused splenic vein thrombosis, with new venous collaterals in the left upper quadrant. Consider endoscopic ultrasound with FNA for tissue diagnosis.  Stable benign hepatic hemangioma.     09/27/2017 Tumor Marker    Patient's tumor was tested for the following markers: CA-125 Results of the tumor marker test revealed 39.4    11/02/2017 Tumor Marker    Patient's tumor was tested for the following markers: CA-125 Results of the tumor marker test revealed 19    11/07/2017 Tumor Marker    Patient's tumor was tested for the following markers: CA-125 Results of the tumor marker test revealed 18.3     11/18/2017 Genetic Testing    MSH2 c.2237dupT pathogenic mutation identified in the CancerNext panel.  The CancerNext gene panel offered by [employer] includes sequencing and rearrangement analysis for the following 34 genes:   APC, ATM, BARD1, BMPR1A, BRCA1, BRCA2, BRIP1, CDH1, CDK4, CDKN2A, CHEK2, DICER1, HOXB13, EPCAM, GREM1, MLH1, MRE11A, MSH2, MSH6, MUTYH, NBN, NF1, PALB2, PMS2, POLD1, POLE, PTEN, RAD50, RAD51C, RAD51D, SMAD4, SMARCA4, STK11, and TP53.  The report date is November 18, 2017.  MSH2 [id number] pathogenic mutation identified on somatic testing.  These results are consistent with a diagnosis of Lynch syndrome.    12/15/2017 Tumor Marker    Patient's tumor was tested for the following markers: CA-125 Results of the tumor marker test revealed 57.3    01/16/2018 Tumor Marker    Patient's tumor was tested for the following markers: CA-125 Results of the tumor marker test revealed 24.2     Pancreatic cancer ([place])   10/13/2017 Pathology Results    Pancreas tail mass, endoscopic ultrasound-guided, fine needle aspiration II (smears and cell block): Adenocarcinoma    11/18/2017 Genetic Testing    MSH2 c.2237dupT pathogenic mutation identified in the CancerNext panel.  The CancerNext gene panel offered by [employer] includes sequencing and rearrangement analysis for the following 34 genes:   APC, ATM, BARD1, BMPR1A, BRCA1, BRCA2, BRIP1, CDH1, CDK4, CDKN2A, CHEK2, DICER1, HOXB13, EPCAM, GREM1, MLH1, MRE11A, MSH2, MSH6, MUTYH, NBN, NF1, PALB2, PMS2, POLD1, POLE, PTEN, RAD50, RAD51C, RAD51D, SMAD4, SMARCA4, STK11, and TP53.  The report date is November 18, 2017.  MSH2 [id number] pathogenic mutation identified on somatic testing.  These results are consistent  with a diagnosis of Lynch syndrome.    11/24/2017 Pathology Results    A. "TAIL OF PANCREAS AND SPLEEN", DISTAL PANCREATECTOMY AND SPLENECTOMY: Invasive ductal adenocarcinoma, moderately  to poorly differentiated with focal signet ring cell features, of pancreas (distal).   The carcinoma is 2.5 cm in greatest dimension grossly.   Treatment effects present in the form of fibrosis (50%). No lymphovascular or definite perineural invasion identified. All surgical margins are negative for tumor or high-grade dysplasia. Adjacent mucinous neoplasm, most consistent with Intraductal papillary mucinous neoplasm (IPMN) with low-grade dysplasia.   Uninvolved pancreas show atrophy and focal acute inflammation.   Twelve benign lymph nodes (0/12). Spleen with no significant histopathologic abnormalities.  PROCEDURE: distal pancreatectomy and splenectomy TUMOR SITE: distal pancreas TUMOR SIZE:  GREATEST DIMENSION: 2.5 cm  ADDITIONAL DIMENSIONS:  x  cm HISTOLOGIC TYPE: ductal adenocarcinoma HISTOLOGIC GRADE: grade 3 TUMOR EXTENSION: peripancreatic soft tissue MARGINS: negative for tumor TREATMENT EFFECT: treatment effects present in the form of fibrosis (50%). LYMPHOVASCULAR INVASION: not identified PERINEURAL INVASION: no definite evidence REGIONAL LYMPH NODES:   NUMBER OF LYMPH NODES INVOLVED: 0   NUMBER OF LYMPH NODES EXAMINED: 12 PATHOLOGIC STAGE CLASSIFICATION (pTNM, AJCC 8th Ed): ypT2, ypN0 DISTANT METASTASIS (pM): pMx ADDITIONAL PATHOLOGIC FINDINGS: mucinous neoplasm, most consistent with intraductal papillary mucinous neoplasm (IPMN) with low-grade dysplasia, is identified adjacent to the main tumor.    11/24/2017 Cancer Staging    Staging form: Exocrine Pancreas, AJCC 8th Edition - Pathologic stage from 11/24/2017: Stage IB (pT2, pN0, cM0) - Signed by Truitt Merle, MD on 01/03/2018    11/25/2017 Surgery    She had surgery at [facility] 1. Exploratory Laparotomy 2. Distal Pancreatectomy and Splenectomy 3. Intraoperative Ultrasound 4. Open Cholecystectomy     12/15/2017 Cancer Staging    Staging form: Exocrine  Pancreas, AJCC 8th Edition - Clinical: Stage IB (cT2, cN0, cM0) - Signed by Heath Lark, MD on 12/15/2017    12/28/2017 Imaging    12/28/2017 CT Abdomen  IMPRESSION: 1. Postoperative findings from recent partial pancreatectomy including a 21 cubic cm fluid collection along the pancreatic resection margin which could represent early pseudocyst. 2. Nodularity along the lateral limb of the left adrenal gland could also be postoperative but merit surveillance, as the pancreatic lesion was in close proximity to this adrenal gland on the prior CT of 11/07/2017. 3. Asymmetric fullness inferiorly in the left breast. The patient has a history of prior left breast procedures, correlation with mammography is recommended. 4. Other imaging findings of potential clinical significance: Stable hemangioma in the left hepatic lobe. Splenectomy. Aortic Atherosclerosis (ICD10-I70.0). Right hemicolectomy. Small focus of fat necrosis in the right anterior abdominal wall subcutaneous tissues near the laparotomy site. Bilateral nonobstructive nephrolithiasis.    01/02/2018 Tumor Marker    Patient's tumor was tested for the following markers: CA-19-9 Results of the tumor marker test revealed 5    01/03/2018 -  Chemotherapy    She received modified dose FOLFIRINOX     REVIEW OF SYSTEMS:   Constitutional: Denies fevers, chills or abnormal weight loss Eyes: Denies blurriness of vision Ears, nose, mouth, throat, and face: Denies mucositis or sore throat Respiratory: Denies cough, dyspnea or wheezes Cardiovascular: Denies palpitation, chest discomfort or lower extremity swelling Skin: Denies abnormal skin rashes Lymphatics: Denies new lymphadenopathy or easy bruising Behavioral/Psych: Mood is stable, no new changes  All other systems were reviewed with the patient and are negative.  I have reviewed the past medical history, past surgical history, social history and  family history with the patient and they are  unchanged from previous note.  ALLERGIES:  is allergic to codeine; penicillins; and bactrim [sulfamethoxazole-trimethoprim].  MEDICATIONS:  Current Outpatient Medications  Medication Sig Dispense Refill  . acetaminophen (TYLENOL) 650 MG CR tablet Take 1,300 mg by mouth every 8 (eight) hours.    Marland Kitchen dexamethasone (DECADRON) 4 MG tablet Take 1 tablet (4 mg total) by mouth 2 (two) times daily with a meal. 60 tablet 1  . diclofenac sodium (VOLTAREN) 1 % GEL APPLY 3 GM TO AFFECTED 3 LARGE JOINTS UP TO 3 TIMES A DAY AS NEEDED AS DIRECTED 300 g 1  . diphenoxylate-atropine (LOMOTIL) 2.5-0.025 MG tablet Take 1-2 tablets by mouth 4 (four) times daily as needed for diarrhea or loose stools. 1 to 2 po QID prn diarrhea 60 tablet 1  . gabapentin (NEURONTIN) 300 MG capsule TAKE 1 CAPSULE BY MOUTH THREE TIMES A DAY 270 capsule 1  . HYDROmorphone (DILAUDID) 4 MG tablet Take 1 tablet (4 mg total) by mouth every 4 (four) hours as needed for severe pain. 30 tablet 0  . hydrOXYzine (ATARAX/VISTARIL) 10 MG tablet Take 1 tablet (10 mg total) by mouth 3 (three) times daily as needed for itching. 30 tablet 0  . lidocaine-prilocaine (EMLA) cream Apply to affected area once (Patient taking differently: Apply 1 application topically daily as needed (port). Apply to affected area once) 30 g 3  . loratadine (CLARITIN) 10 MG tablet Take 10 mg by mouth daily after breakfast.    . LORazepam (ATIVAN) 0.5 MG tablet Place 1 tablet (0.5 mg total) under the tongue every 6 (six) hours as needed for anxiety. 60 tablet 1  . metFORMIN (GLUCOPHAGE) 500 MG tablet Take 1 tablet (500 mg total) by mouth 2 (two) times daily with a meal. 60 tablet 1  . naproxen sodium (ALEVE) 220 MG tablet Take 220 mg by mouth.    Marland Kitchen omeprazole (PRILOSEC) 20 MG capsule Take 20 mg by mouth 2 (two) times daily before a meal.     . ondansetron (ZOFRAN) 8 MG tablet Take 1 tablet (8 mg total) by mouth every 8 (eight) hours as needed for refractory nausea / vomiting.  Start on day 3 after chemo. 30 tablet 1  . predniSONE (DELTASONE) 5 MG tablet Take 1 tablet (5 mg total) by mouth daily with breakfast. 30 tablet 0  . prochlorperazine (COMPAZINE) 10 MG tablet Take 1 tablet (10 mg total) by mouth every 6 (six) hours as needed (Nausea or vomiting). 90 tablet 1  . simethicone (MYLICON) 962 MG chewable tablet Chew 125 mg by mouth every 6 (six) hours as needed for flatulence.    . triamcinolone (NASACORT) 55 MCG/ACT AERO nasal inhaler Place 2 sprays into the nose daily. 1 Inhaler 12   No current facility-administered medications for this visit.    Facility-Administered Medications Ordered in Other Visits  Medication Dose Route Frequency Provider Last Rate Last Dose  . atropine injection 0.5 mg  0.5 mg Intravenous Once PRN Alvy Bimler, Hadyn Azer, MD      . fluorouracil (ADRUCIL) 3,950 mg in sodium chloride 0.9 % 71 mL chemo infusion  1,920 mg/m2 (Order-Specific) Intravenous 1 day or 1 dose Alvy Bimler, Loredana Medellin, MD      . irinotecan (CAMPTOSAR) 200 mg in dextrose 5 % 500 mL chemo infusion  100 mg/m2 (Order-Specific) Intravenous Once Alvy Bimler, Bowen Kia, MD      . leucovorin 828 mg in dextrose 5 % 250 mL infusion  400 mg/m2 (Order-Specific) Intravenous Once [place], Gayanne Prescott,  MD      . oxaliplatin (ELOXATIN) 150 mg in dextrose 5 % 500 mL chemo infusion  150 mg Intravenous Once Alvy Bimler, Demari Gales, MD        PHYSICAL EXAMINATION: ECOG PERFORMANCE STATUS: 2 - Symptomatic, <50% confined to bed  Vitals:   03/28/18 0941  BP: (!) 161/99  Pulse: 94  Resp: 17  Temp: 98.3 F (36.8 C)  SpO2: 100%   Filed Weights   03/28/18 0941  Weight: 217 lb 9.6 oz (98.7 kg)    GENERAL:alert, no distress and comfortable.  She looks mildly cushingoid SKIN: skin color, texture, turgor are normal, no rashes or significant lesions EYES: normal, Conjunctiva are pink and non-injected, sclera clear OROPHARYNX:no exudate, no erythema and lips, buccal mucosa, and tongue normal  NECK: supple, thyroid normal size, non-tender,  without nodularity LYMPH:  no palpable lymphadenopathy in the cervical, axillary or inguinal LUNGS: clear to auscultation and percussion with normal breathing effort HEART: regular rate & rhythm and no murmurs and no lower extremity edema ABDOMEN:abdomen soft, non-tender and normal bowel sounds Musculoskeletal:no cyanosis of digits and no clubbing  NEURO: alert & oriented x 3 with fluent speech, no focal motor/sensory deficits  LABORATORY DATA:  I have reviewed the data as listed    Component Value Date/Time   NA 140 03/28/2018 0856   K 4.2 03/28/2018 0856   CL 105 03/28/2018 0856   CO2 21 (L) 03/28/2018 0856   GLUCOSE 242 (H) 03/28/2018 0856   BUN 13 03/28/2018 0856   CREATININE 0.71 03/28/2018 0856   CREATININE 0.63 02/06/2018 1001   CREATININE 0.68 07/27/2017 0851   CALCIUM 9.2 03/28/2018 0856   PROT 7.0 03/28/2018 0856   ALBUMIN 3.4 (L) 03/28/2018 0856   AST 56 (H) 03/28/2018 0856   AST 20 02/06/2018 1001   ALT 120 (H) 03/28/2018 0856   ALT 46 (H) 02/06/2018 1001   ALKPHOS 172 (H) 03/28/2018 0856   BILITOT 0.3 03/28/2018 0856   BILITOT <0.2 (L) 02/06/2018 1001   GFRNONAA >60 03/28/2018 0856   GFRNONAA >60 02/06/2018 1001   GFRNONAA 106 07/27/2017 0851   GFRAA >60 03/28/2018 0856   GFRAA >60 02/06/2018 1001   GFRAA 123 07/27/2017 0851    No results found for: SPEP, UPEP  Lab Results  Component Value Date   WBC 34.6 (H) 03/28/2018   NEUTROABS 24.8 (H) 03/28/2018   HGB 11.3 (L) 03/28/2018   HCT 36.8 03/28/2018   MCV 93.4 03/28/2018   PLT 269 03/28/2018      Chemistry      Component Value Date/Time   NA 140 03/28/2018 0856   K 4.2 03/28/2018 0856   CL 105 03/28/2018 0856   CO2 21 (L) 03/28/2018 0856   BUN 13 03/28/2018 0856   CREATININE 0.71 03/28/2018 0856   CREATININE 0.63 02/06/2018 1001   CREATININE 0.68 07/27/2017 0851      Component Value Date/Time   CALCIUM 9.2 03/28/2018 0856   ALKPHOS 172 (H) 03/28/2018 0856   AST 56 (H) 03/28/2018 0856    AST 20 02/06/2018 1001   ALT 120 (H) 03/28/2018 0856   ALT 46 (H) 02/06/2018 1001   BILITOT 0.3 03/28/2018 0856   BILITOT <0.2 (L) 02/06/2018 1001       All questions were answered. The patient knows to call the clinic with any problems, questions or concerns. No barriers to learning was detected.  I spent 25 minutes counseling the patient face to face. The total time spent in the appointment  was 40 minutes and more than 50% was on counseling and review of test results  Heath Lark, MD 03/28/2018 11:53 AM

## 2018-03-28 NOTE — Assessment & Plan Note (Signed)
Her bone pain and arthritis pain are stable with current treatment of steroids and pain medicine.  She will continue the same

## 2018-03-28 NOTE — Assessment & Plan Note (Signed)
She has slight worsening neuropathy I plan to reduce the dose of oxaliplatin

## 2018-03-28 NOTE — Assessment & Plan Note (Signed)
She tolerated last treatment poorly with significant diarrhea and fatigue She is noted to have elevated liver enzymes and worsening peripheral neuropathy I recommend further dose adjustment of irinotecan, along with reduced dose 5-FU and oxaliplatin She is scheduled for CT imaging in 2 weeks for objective assessment of response to therapy I will see her back after that to review test results and to proceed with further treatment

## 2018-03-28 NOTE — Telephone Encounter (Signed)
Per 2/4 no los

## 2018-03-28 NOTE — Assessment & Plan Note (Signed)
This is related to side effects of chemo I plan further dose reduction of irinotecan and 5-FU

## 2018-03-29 ENCOUNTER — Telehealth: Payer: Self-pay

## 2018-03-29 ENCOUNTER — Other Ambulatory Visit: Payer: Self-pay | Admitting: Hematology and Oncology

## 2018-03-29 DIAGNOSIS — C541 Malignant neoplasm of endometrium: Secondary | ICD-10-CM

## 2018-03-29 DIAGNOSIS — C7961 Secondary malignant neoplasm of right ovary: Secondary | ICD-10-CM

## 2018-03-29 LAB — CANCER ANTIGEN 19-9: CA 19-9: 16 U/mL (ref 0–35)

## 2018-03-29 NOTE — Telephone Encounter (Signed)
Called with below message. Per Dr. Alvy Bimler given CA 19-9 given results. Instructed to call for further concerns or questions. She verbalized understanding.

## 2018-03-29 NOTE — Telephone Encounter (Signed)
I added standing order now

## 2018-03-29 NOTE — Telephone Encounter (Signed)
She called and left a message to call her.  Called back. She is asking if Dr. Alvy Bimler could do CA 125 on lab appt on 2/17, she has not had one 2 months. Chemo went well yesterday. She took benadryl at 10 pm last night to help her sleep. She woke up at 1 am with a allergic reaction she thinks. She was having hoarseness, trouble breathing and felt like her throat was closing. She took benadryl again and it went away in 15 minutes, she went back to sleep. No further issues and she feels good today. She has a Epi pen at home from past allergy shots but did not use it.

## 2018-03-30 ENCOUNTER — Inpatient Hospital Stay: Payer: BLUE CROSS/BLUE SHIELD

## 2018-03-30 VITALS — BP 136/75 | HR 62 | Temp 98.1°F | Resp 18

## 2018-03-30 DIAGNOSIS — C252 Malignant neoplasm of tail of pancreas: Secondary | ICD-10-CM | POA: Diagnosis not present

## 2018-03-30 DIAGNOSIS — Z9081 Acquired absence of spleen: Secondary | ICD-10-CM | POA: Diagnosis not present

## 2018-03-30 DIAGNOSIS — E099 Drug or chemical induced diabetes mellitus without complications: Secondary | ICD-10-CM | POA: Diagnosis not present

## 2018-03-30 DIAGNOSIS — Z1509 Genetic susceptibility to other malignant neoplasm: Secondary | ICD-10-CM | POA: Diagnosis not present

## 2018-03-30 DIAGNOSIS — R110 Nausea: Secondary | ICD-10-CM | POA: Diagnosis not present

## 2018-03-30 DIAGNOSIS — T380X5A Adverse effect of glucocorticoids and synthetic analogues, initial encounter: Secondary | ICD-10-CM | POA: Diagnosis not present

## 2018-03-30 DIAGNOSIS — T451X5A Adverse effect of antineoplastic and immunosuppressive drugs, initial encounter: Secondary | ICD-10-CM | POA: Diagnosis not present

## 2018-03-30 DIAGNOSIS — C7961 Secondary malignant neoplasm of right ovary: Secondary | ICD-10-CM | POA: Diagnosis not present

## 2018-03-30 DIAGNOSIS — C541 Malignant neoplasm of endometrium: Secondary | ICD-10-CM

## 2018-03-30 DIAGNOSIS — G620 Drug-induced polyneuropathy: Secondary | ICD-10-CM | POA: Diagnosis not present

## 2018-03-30 DIAGNOSIS — Z5189 Encounter for other specified aftercare: Secondary | ICD-10-CM | POA: Diagnosis not present

## 2018-03-30 DIAGNOSIS — R197 Diarrhea, unspecified: Secondary | ICD-10-CM | POA: Diagnosis not present

## 2018-03-30 DIAGNOSIS — G8929 Other chronic pain: Secondary | ICD-10-CM | POA: Diagnosis not present

## 2018-03-30 DIAGNOSIS — I2699 Other pulmonary embolism without acute cor pulmonale: Secondary | ICD-10-CM | POA: Diagnosis not present

## 2018-03-30 DIAGNOSIS — M898X9 Other specified disorders of bone, unspecified site: Secondary | ICD-10-CM | POA: Diagnosis not present

## 2018-03-30 DIAGNOSIS — Z5111 Encounter for antineoplastic chemotherapy: Secondary | ICD-10-CM | POA: Diagnosis not present

## 2018-03-30 MED ORDER — SODIUM CHLORIDE 0.9% FLUSH
10.0000 mL | INTRAVENOUS | Status: DC | PRN
  Administered 2018-03-30: 10 mL
  Filled 2018-03-30: qty 10

## 2018-03-30 MED ORDER — PEGFILGRASTIM INJECTION 6 MG/0.6ML ~~LOC~~
6.0000 mg | PREFILLED_SYRINGE | Freq: Once | SUBCUTANEOUS | Status: AC
  Administered 2018-03-30: 6 mg via SUBCUTANEOUS

## 2018-03-30 MED ORDER — HEPARIN SOD (PORK) LOCK FLUSH 100 UNIT/ML IV SOLN
500.0000 [IU] | Freq: Once | INTRAVENOUS | Status: AC | PRN
  Administered 2018-03-30: 500 [IU]
  Filled 2018-03-30: qty 5

## 2018-03-30 MED ORDER — PEGFILGRASTIM INJECTION 6 MG/0.6ML ~~LOC~~
PREFILLED_SYRINGE | SUBCUTANEOUS | Status: AC
  Filled 2018-03-30: qty 0.6

## 2018-03-31 ENCOUNTER — Telehealth: Payer: Self-pay | Admitting: *Deleted

## 2018-03-31 MED ORDER — HYDROXYZINE HCL 10 MG PO TABS: 10.0000 mg | ORAL_TABLET | Freq: Three times a day (TID) | ORAL | 1 refills | Status: DC | PRN

## 2018-03-31 NOTE — Telephone Encounter (Signed)
Pt called asking for refill on her hydroxyzine.  Dr Alvy Bimler OK's refill & encouraged to limit this due to sedating effects & other meds that also sedate.  The pt reports that she is not taking anything for nausea right now & itching usually only last @ 3 days but she is out.  Script sent to her pharamcy.

## 2018-04-03 ENCOUNTER — Telehealth: Payer: Self-pay | Admitting: *Deleted

## 2018-04-03 ENCOUNTER — Other Ambulatory Visit: Payer: Self-pay | Admitting: Hematology and Oncology

## 2018-04-03 DIAGNOSIS — C252 Malignant neoplasm of tail of pancreas: Secondary | ICD-10-CM | POA: Diagnosis not present

## 2018-04-03 DIAGNOSIS — R197 Diarrhea, unspecified: Secondary | ICD-10-CM

## 2018-04-03 MED ORDER — DIPHENOXYLATE-ATROPINE 2.5-0.025 MG PO TABS: 1.0000 | ORAL_TABLET | Freq: Four times a day (QID) | ORAL | 1 refills | Status: DC | PRN

## 2018-04-03 NOTE — Telephone Encounter (Signed)
Patient called for a refill on Lomotil. Please send to [place].

## 2018-04-03 NOTE — Telephone Encounter (Signed)
done

## 2018-04-05 DIAGNOSIS — C252 Malignant neoplasm of tail of pancreas: Secondary | ICD-10-CM | POA: Diagnosis not present

## 2018-04-06 ENCOUNTER — Other Ambulatory Visit: Payer: Self-pay | Admitting: Hematology and Oncology

## 2018-04-07 ENCOUNTER — Ambulatory Visit (HOSPITAL_COMMUNITY): Payer: BLUE CROSS/BLUE SHIELD

## 2018-04-10 ENCOUNTER — Telehealth: Payer: Self-pay | Admitting: Hematology and Oncology

## 2018-04-10 ENCOUNTER — Other Ambulatory Visit: Payer: Self-pay | Admitting: Hematology and Oncology

## 2018-04-10 ENCOUNTER — Ambulatory Visit: Payer: BLUE CROSS/BLUE SHIELD | Admitting: Obstetrics

## 2018-04-10 ENCOUNTER — Inpatient Hospital Stay: Payer: BLUE CROSS/BLUE SHIELD

## 2018-04-10 ENCOUNTER — Ambulatory Visit (HOSPITAL_COMMUNITY)
Admission: RE | Admit: 2018-04-10 | Discharge: 2018-04-10 | Disposition: A | Discharge status: Home / Self Care | Payer: BLUE CROSS/BLUE SHIELD | Source: Ambulatory Visit | Attending: Hematology and Oncology | Admitting: Hematology and Oncology

## 2018-04-10 DIAGNOSIS — I2699 Other pulmonary embolism without acute cor pulmonale: Secondary | ICD-10-CM | POA: Insufficient documentation

## 2018-04-10 DIAGNOSIS — Z1509 Genetic susceptibility to other malignant neoplasm: Secondary | ICD-10-CM | POA: Diagnosis not present

## 2018-04-10 DIAGNOSIS — M898X9 Other specified disorders of bone, unspecified site: Secondary | ICD-10-CM | POA: Diagnosis not present

## 2018-04-10 DIAGNOSIS — C7961 Secondary malignant neoplasm of right ovary: Secondary | ICD-10-CM | POA: Diagnosis not present

## 2018-04-10 DIAGNOSIS — D490 Neoplasm of unspecified behavior of digestive system: Secondary | ICD-10-CM

## 2018-04-10 DIAGNOSIS — Z5189 Encounter for other specified aftercare: Secondary | ICD-10-CM | POA: Diagnosis not present

## 2018-04-10 DIAGNOSIS — C541 Malignant neoplasm of endometrium: Secondary | ICD-10-CM

## 2018-04-10 DIAGNOSIS — Z9081 Acquired absence of spleen: Secondary | ICD-10-CM | POA: Diagnosis not present

## 2018-04-10 DIAGNOSIS — C252 Malignant neoplasm of tail of pancreas: Secondary | ICD-10-CM

## 2018-04-10 DIAGNOSIS — G8929 Other chronic pain: Secondary | ICD-10-CM | POA: Diagnosis not present

## 2018-04-10 DIAGNOSIS — R197 Diarrhea, unspecified: Secondary | ICD-10-CM | POA: Diagnosis not present

## 2018-04-10 DIAGNOSIS — T451X5A Adverse effect of antineoplastic and immunosuppressive drugs, initial encounter: Secondary | ICD-10-CM | POA: Diagnosis not present

## 2018-04-10 DIAGNOSIS — C569 Malignant neoplasm of unspecified ovary: Secondary | ICD-10-CM | POA: Diagnosis not present

## 2018-04-10 DIAGNOSIS — G620 Drug-induced polyneuropathy: Secondary | ICD-10-CM | POA: Diagnosis not present

## 2018-04-10 DIAGNOSIS — R110 Nausea: Secondary | ICD-10-CM | POA: Diagnosis not present

## 2018-04-10 DIAGNOSIS — T380X5A Adverse effect of glucocorticoids and synthetic analogues, initial encounter: Secondary | ICD-10-CM | POA: Diagnosis not present

## 2018-04-10 DIAGNOSIS — E099 Drug or chemical induced diabetes mellitus without complications: Secondary | ICD-10-CM | POA: Diagnosis not present

## 2018-04-10 DIAGNOSIS — Z5111 Encounter for antineoplastic chemotherapy: Secondary | ICD-10-CM | POA: Diagnosis not present

## 2018-04-10 LAB — CBC WITH DIFFERENTIAL/PLATELET
Abs Immature Granulocytes: 1.1 10*3/uL — ABNORMAL HIGH (ref 0.00–0.07)
Basophils Absolute: 0.2 10*3/uL — ABNORMAL HIGH (ref 0.0–0.1)
Basophils Relative: 1 %
Eosinophils Absolute: 0.1 10*3/uL (ref 0.0–0.5)
Eosinophils Relative: 1 %
HCT: 35.2 % — ABNORMAL LOW (ref 36.0–46.0)
Hemoglobin: 10.9 g/dL — ABNORMAL LOW (ref 12.0–15.0)
Immature Granulocytes: 6 %
Lymphocytes Relative: 27 %
Lymphs Abs: 4.8 10*3/uL — ABNORMAL HIGH (ref 0.7–4.0)
MCH: 29.2 pg (ref 26.0–34.0)
MCHC: 31 g/dL (ref 30.0–36.0)
MCV: 94.4 fL (ref 80.0–100.0)
Monocytes Absolute: 1.8 10*3/uL — ABNORMAL HIGH (ref 0.1–1.0)
Monocytes Relative: 10 %
Neutro Abs: 9.8 10*3/uL — ABNORMAL HIGH (ref 1.7–7.7)
Neutrophils Relative %: 55 %
Platelets: 265 10*3/uL (ref 150–400)
RBC: 3.73 MIL/uL — ABNORMAL LOW (ref 3.87–5.11)
RDW: 22.4 % — ABNORMAL HIGH (ref 11.5–15.5)
WBC: 17.7 10*3/uL — ABNORMAL HIGH (ref 4.0–10.5)
nRBC: 0.5 % — ABNORMAL HIGH (ref 0.0–0.2)

## 2018-04-10 LAB — COMPREHENSIVE METABOLIC PANEL
ALT: 63 U/L — ABNORMAL HIGH (ref 0–44)
AST: 36 U/L (ref 15–41)
Albumin: 3.3 g/dL — ABNORMAL LOW (ref 3.5–5.0)
Alkaline Phosphatase: 141 U/L — ABNORMAL HIGH (ref 38–126)
Anion gap: 14 (ref 5–15)
BUN: 9 mg/dL (ref 6–20)
CO2: 22 mmol/L (ref 22–32)
Calcium: 9 mg/dL (ref 8.9–10.3)
Chloride: 105 mmol/L (ref 98–111)
Creatinine, Ser: 0.64 mg/dL (ref 0.44–1.00)
GFR calc Af Amer: >60 mL/min (ref >60)
GFR calc non Af Amer: >60 mL/min (ref >60)
Glucose, Bld: 115 mg/dL — ABNORMAL HIGH (ref 70–99)
Potassium: 3.3 mmol/L — ABNORMAL LOW (ref 3.5–5.1)
Sodium: 141 mmol/L (ref 135–145)
Total Bilirubin: <0.2 mg/dL — ABNORMAL LOW (ref 0.3–1.2)
Total Protein: 6.3 g/dL — ABNORMAL LOW (ref 6.5–8.1)

## 2018-04-10 LAB — MAGNESIUM: Magnesium: 1.5 mg/dL — ABNORMAL LOW (ref 1.7–2.4)

## 2018-04-10 MED ORDER — SODIUM CHLORIDE 0.9% FLUSH
10.0000 mL | Freq: Once | INTRAVENOUS | Status: AC
  Administered 2018-04-10: 10 mL
  Filled 2018-04-10: qty 10

## 2018-04-10 MED ORDER — SODIUM CHLORIDE (PF) 0.9 % IJ SOLN
INTRAMUSCULAR | Status: AC
  Filled 2018-04-10: qty 50

## 2018-04-10 MED ORDER — HEPARIN SOD (PORK) LOCK FLUSH 100 UNIT/ML IV SOLN
500.0000 [IU] | Freq: Once | INTRAVENOUS | Status: AC
  Administered 2018-04-10: 500 [IU] via INTRAVENOUS

## 2018-04-10 MED ORDER — RIVAROXABAN (XARELTO) VTE STARTER PACK (15 & 20 MG): ORAL_TABLET | ORAL | 0 refills | Status: DC

## 2018-04-10 MED ORDER — IOHEXOL 300 MG/ML  SOLN
100.0000 mL | Freq: Once | INTRAMUSCULAR | Status: AC | PRN
  Administered 2018-04-10: 100 mL via INTRAVENOUS

## 2018-04-10 MED ORDER — HEPARIN SOD (PORK) LOCK FLUSH 100 UNIT/ML IV SOLN
INTRAVENOUS | Status: AC
  Filled 2018-04-10: qty 5

## 2018-04-10 NOTE — Telephone Encounter (Signed)
I spoke with the patient over the telephone in regards to incidental finding of pulmonary embolus.  She has been having shortness of breath for some time.  She denies chest pain or dizziness We discussed the risk and benefits of managing this as an outpatient versus hospitalization. The patient felt well enough to wait until she is evaluated tomorrow We discussed the risk of benefits of Lovenox versus Xarelto and she would like to try Xarelto I have sent electronic prescription to the patient's pharmacy She will start today If she start to have symptoms of dizziness, chest pain or worsening shortness of breath, she understood that she needs to go to the emergency department to be evaluated immediately.

## 2018-04-11 ENCOUNTER — Telehealth: Payer: Self-pay | Admitting: Hematology and Oncology

## 2018-04-11 ENCOUNTER — Inpatient Hospital Stay: Payer: BLUE CROSS/BLUE SHIELD

## 2018-04-11 ENCOUNTER — Inpatient Hospital Stay: Payer: BLUE CROSS/BLUE SHIELD | Admitting: Hematology and Oncology

## 2018-04-11 ENCOUNTER — Encounter: Payer: Self-pay | Admitting: Hematology and Oncology

## 2018-04-11 DIAGNOSIS — I2699 Other pulmonary embolism without acute cor pulmonale: Secondary | ICD-10-CM | POA: Diagnosis not present

## 2018-04-11 DIAGNOSIS — C252 Malignant neoplasm of tail of pancreas: Secondary | ICD-10-CM

## 2018-04-11 DIAGNOSIS — Z7984 Long term (current) use of oral hypoglycemic drugs: Secondary | ICD-10-CM

## 2018-04-11 DIAGNOSIS — E099 Drug or chemical induced diabetes mellitus without complications: Secondary | ICD-10-CM

## 2018-04-11 DIAGNOSIS — R197 Diarrhea, unspecified: Secondary | ICD-10-CM

## 2018-04-11 DIAGNOSIS — C541 Malignant neoplasm of endometrium: Secondary | ICD-10-CM

## 2018-04-11 DIAGNOSIS — R110 Nausea: Secondary | ICD-10-CM

## 2018-04-11 DIAGNOSIS — Z9081 Acquired absence of spleen: Secondary | ICD-10-CM

## 2018-04-11 DIAGNOSIS — M898X9 Other specified disorders of bone, unspecified site: Secondary | ICD-10-CM

## 2018-04-11 DIAGNOSIS — G620 Drug-induced polyneuropathy: Secondary | ICD-10-CM

## 2018-04-11 DIAGNOSIS — Z5111 Encounter for antineoplastic chemotherapy: Secondary | ICD-10-CM | POA: Diagnosis not present

## 2018-04-11 DIAGNOSIS — T380X5A Adverse effect of glucocorticoids and synthetic analogues, initial encounter: Secondary | ICD-10-CM

## 2018-04-11 DIAGNOSIS — Z79899 Other long term (current) drug therapy: Secondary | ICD-10-CM

## 2018-04-11 DIAGNOSIS — Z1509 Genetic susceptibility to other malignant neoplasm: Secondary | ICD-10-CM

## 2018-04-11 DIAGNOSIS — Z90722 Acquired absence of ovaries, bilateral: Secondary | ICD-10-CM

## 2018-04-11 DIAGNOSIS — T451X5A Adverse effect of antineoplastic and immunosuppressive drugs, initial encounter: Secondary | ICD-10-CM | POA: Diagnosis not present

## 2018-04-11 DIAGNOSIS — R748 Abnormal levels of other serum enzymes: Secondary | ICD-10-CM

## 2018-04-11 DIAGNOSIS — G8929 Other chronic pain: Secondary | ICD-10-CM | POA: Diagnosis not present

## 2018-04-11 DIAGNOSIS — Z90411 Acquired partial absence of pancreas: Secondary | ICD-10-CM

## 2018-04-11 DIAGNOSIS — C7961 Secondary malignant neoplasm of right ovary: Secondary | ICD-10-CM | POA: Diagnosis not present

## 2018-04-11 DIAGNOSIS — Z5189 Encounter for other specified aftercare: Secondary | ICD-10-CM | POA: Diagnosis not present

## 2018-04-11 DIAGNOSIS — Z90710 Acquired absence of both cervix and uterus: Secondary | ICD-10-CM

## 2018-04-11 LAB — CA 125: Cancer Antigen (CA) 125: 18.2 U/mL (ref 0.0–38.1)

## 2018-04-11 MED ORDER — OXALIPLATIN CHEMO INJECTION 100 MG/20ML
42.5000 mg/m2 | Freq: Once | INTRAVENOUS | Status: AC
  Administered 2018-04-11: 90 mg via INTRAVENOUS
  Filled 2018-04-11: qty 18

## 2018-04-11 MED ORDER — ATROPINE SULFATE 1 MG/ML IJ SOLN
INTRAMUSCULAR | Status: AC
  Filled 2018-04-11: qty 1

## 2018-04-11 MED ORDER — DIPHENHYDRAMINE HCL 50 MG/ML IJ SOLN
INTRAMUSCULAR | Status: AC
  Filled 2018-04-11: qty 1

## 2018-04-11 MED ORDER — DEXTROSE 5 % IV SOLN
Freq: Once | INTRAVENOUS | Status: AC
  Administered 2018-04-11: 11:00:00 via INTRAVENOUS
  Filled 2018-04-11: qty 250

## 2018-04-11 MED ORDER — FAMOTIDINE IN NACL 20-0.9 MG/50ML-% IV SOLN
20.0000 mg | Freq: Once | INTRAVENOUS | Status: AC
  Administered 2018-04-11: 20 mg via INTRAVENOUS

## 2018-04-11 MED ORDER — SODIUM CHLORIDE 0.9% FLUSH
10.0000 mL | INTRAVENOUS | Status: DC | PRN
  Filled 2018-04-11: qty 10

## 2018-04-11 MED ORDER — DIPHENHYDRAMINE HCL 50 MG/ML IJ SOLN
50.0000 mg | Freq: Once | INTRAMUSCULAR | Status: AC
  Administered 2018-04-11: 50 mg via INTRAVENOUS

## 2018-04-11 MED ORDER — LEUCOVORIN CALCIUM INJECTION 350 MG
400.0000 mg/m2 | Freq: Once | INTRAVENOUS | Status: AC
  Administered 2018-04-11: 828 mg via INTRAVENOUS
  Filled 2018-04-11: qty 41.4

## 2018-04-11 MED ORDER — SODIUM CHLORIDE 0.9 % IV SOLN
Freq: Once | INTRAVENOUS | Status: AC
  Administered 2018-04-11: 11:00:00 via INTRAVENOUS
  Filled 2018-04-11: qty 5

## 2018-04-11 MED ORDER — PALONOSETRON HCL INJECTION 0.25 MG/5ML
INTRAVENOUS | Status: AC
  Filled 2018-04-11: qty 5

## 2018-04-11 MED ORDER — FAMOTIDINE IN NACL 20-0.9 MG/50ML-% IV SOLN
INTRAVENOUS | Status: AC
  Filled 2018-04-11: qty 50

## 2018-04-11 MED ORDER — DEXTROSE 5 % IV SOLN
Freq: Once | INTRAVENOUS | Status: AC
  Administered 2018-04-11: 10:00:00 via INTRAVENOUS
  Filled 2018-04-11: qty 250

## 2018-04-11 MED ORDER — SODIUM CHLORIDE 0.9 % IV SOLN
1920.0000 mg/m2 | INTRAVENOUS | Status: DC
  Administered 2018-04-11: 3950 mg via INTRAVENOUS
  Filled 2018-04-11: qty 79

## 2018-04-11 MED ORDER — IRINOTECAN HCL CHEMO INJECTION 100 MG/5ML
100.0000 mg/m2 | Freq: Once | INTRAVENOUS | Status: AC
  Administered 2018-04-11: 200 mg via INTRAVENOUS
  Filled 2018-04-11: qty 10

## 2018-04-11 MED ORDER — ATROPINE SULFATE 0.4 MG/ML IJ SOLN
INTRAMUSCULAR | Status: AC
  Filled 2018-04-11: qty 1

## 2018-04-11 MED ORDER — PALONOSETRON HCL INJECTION 0.25 MG/5ML
0.2500 mg | Freq: Once | INTRAVENOUS | Status: AC
  Administered 2018-04-11: 0.25 mg via INTRAVENOUS

## 2018-04-11 MED ORDER — ATROPINE SULFATE 1 MG/ML IJ SOLN
0.5000 mg | Freq: Once | INTRAMUSCULAR | Status: AC | PRN
  Administered 2018-04-11: 0.5 mg via INTRAVENOUS

## 2018-04-11 NOTE — Progress Notes (Signed)
[facility] OFFICE PROGRESS NOTE  Patient Care Team: [name], MD as PCP - General (Family Medicine)  ASSESSMENT & PLAN:  Pancreatic cancer [facility]) She tolerated last treatment poorly with significant diarrhea, changes in blood count, abnormal liver enzymes, neuropathy and fatigue I recommend continue on reduced dose of irinotecan, 5-FU and further reduced dose oxaliplatin at 50% CT imaging show no evidence of cancer.  The fluid collection near her pancreas is resolving I recommend 3 more cycles after today's treatment to complete 10 cycles of chemotherapy before stopping.  Secondary malignant neoplasm of right ovary ([place]) She has no signs of cancer.  Tumor marker is stable.  Elevated liver enzymes She has mildly elevated liver enzymes, likely due to recent chemotherapy.  Observe only for now I am planning to reduce dose of chemotherapy as above.    Steroid-induced diabetes ([place]) We discussed aggressive risk factor modification.  She will continue on metformin  Peripheral neuropathy due to chemotherapy ([place]) I plan to reduce the dose of oxaliplatin further to 50%  Pulmonary embolism [facility]) She has incidental finding of pulmonary embolism.  She is somewhat symptomatic with shortness of breath on minimal exertion but denies chest pain We discussed the risk and benefits of ordering further imaging studies such as CT angiogram, venous Doppler or echocardiogram but at present time, she is comfortable to just be managed with anticoagulation therapy without further tests. We discussed the risk, benefits, side effects of anticoagulation therapy and she is doing well with Xarelto since yesterday. The plan will be to continue on treatment for minimum 6 months to a year.   No orders of the defined types were placed in this encounter.   INTERVAL HISTORY: Please see below for problem oriented charting. She returns with her husband to review test results and follow-up She was  started on Xarelto yesterday due to incidental finding of pulmonary embolism She complained of shortness of breath on minimal exertion but denies recent chest pain Her neuropathy is stable The patient denies any recent signs or symptoms of bleeding such as spontaneous epistaxis, hematuria or hematochezia. She complained of recent reactions to chemotherapy that resolved with Benadryl Her chronic joint pain is stable with current prescription pain medicine She continues to have frequent diarrhea but denies significant nausea or vomiting.  IV fluids at home is helpful.  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 Mixed attenuation mass emanates from the right adnexa measuring 10.8 x 8.0 cm very suspicious for right ovarian carcinoma. 2. Abnormality of the tail of the pancreas may be due to mild pancreatitis and small pseudocyst formation, but neoplasm cannot be excluded. 3. Small amount of ascites within abdomen and pelvis. 4. Small nonobstructing renal calculi bilaterally.     08/30/2017 Pathology Results    1. Uterus and cervix, with left fallopian tube - ENDOMETRIOID ADENOCARCINOMA, FIGO GRADE I, ARISING IN A BACKGROUND OF DIFFUSE COMPLEX ATYPICAL HYPERPLASIA. - CARCINOMA INVADES FOR OF DEPTH OF 0.2 CM WHERE THICKNESS OF MYOMETRIAL WALL IS 2.1 CM. - ALL RESECTION MARGINS ARE NEGATIVE FOR CARCINOMA. - NEGATIVE FOR LYMPHOVASCULAR OR PERINEURAL INVASION. - CERVICAL STROMA IS NOT INVOLVED. - SEE ONCOLOGY TABLE. - SEE NOTE 2. Ovary and fallopian tube, right - PRIMARY OVARIAN ENDOMETRIOID ADENOCARCINOMA, FIGO GRADE II, 12 CM. - THE OVARIAN SURFACE  IS FOCALLY INVOLVED BY CARCINOMA. - NEGATIVE FOR LYMPHOVASCULAR INVASION. - BENIGN UNREMARKABLE FALLOPIAN TUBE, NEGATIVE FOR CARCINOMA. - SEE ONCOLOGY  TABLE. - SEE NOTE 3. Cul-de-sac biopsy - METASTATIC ADENOCARCINOMA, MOST CONSISTENT WITH PRIMARY OVARIAN ENDOMETRIOID ADENOCARCINOMA. 4. Ovary, left - BENIGN UNREMARKABLE OVARY, NEGATIVE FOR MALIGNANCY. Microscopic Comment 1. UTERUS, CARCINOMA OR CARCINOSARCOMA Procedure: Total hysterectomy with bilateral salpingo-oophorectomy. Histologic type: Endometrioid adenocarcinoma. Histologic Grade: FIGO Grade I Myometrial invasion: Depth of invasion: 2 mm Myometrial thickness: 21 mm Uterine Serosa Involvement: Not identified Cervical stromal involvement: Not identified Extent of involvement of other organs: Not applicable Lymphovascular invasion: Not identified Regional Lymph Nodes: Examined: 0 Sentinel 0 Non-sentinel 0 Total Tumor block for ancillary studies: 1H MMR / MSI testing: Pending Pathologic Stage Classification (pTNM, AJCC 8th edition): pT1a, pNX (v4.1.0.0) 2. OVARY or FALLOPIAN TUBE or PRIMARY PERITONEUM: Procedure: Salpingo-oophorectomy Specimen Integrity: Intact Tumor Site: Right ovary Ovarian Surface Involvement (required only if applicable): Focally involved by carcinoma Fallopian Tube Surface Involvement (required only if applicable): Not identified Tumor Size: 12 cm Histologic Type: Endometrioid adenocarcinoma Histologic Grade: Grade II Implants (required for advanced stage serous/seromucinous borderline tumors only): Not applicable Other Tissue/ Organ Involvement: Cul de sac biopsy involved by tumor Largest Extrapelvic Peritoneal Focus (required only if applicable): Not applicable Peritoneal/Ascitic Fluid: Negative for carcinoma (case # [id number]) Treatment Effect (required only for high-grade serous carcinomas): Not applicable Regional Lymph Nodes: No lymph nodes submitted or found Number of Lymph Nodes Examined: 0 Pathologic Stage Classification (pTNM, AJCC 8th Edition): pT2b, pN0 Representative Tumor Block: 2B and 2E 1. Molecular study for microsatellite  instability and immunohistochemical stains for MMR-related proteins are pending and will be reported in an addendum. 2. Immunohistochemical stain show that the ovarian tumor is positive for CK7 and PAX8 (both diffuse), CDX2 (patchy and weak); and negative for CK20. This immunoprofile is consistent with the above diagnosis. Dr. Lyndon Code has reviewed this case and concurs with the above diagnosis. Molecular study for microsatellite instability and immunohistochemical stains for MMR-related proteins are pending and will be reported in an addendum    08/30/2017 Genetic Testing    Patient has genetic testing done for MSI  Results revealed patient has the following mutation(s): loss of [facility] 2    08/30/2017 Surgery    Surgeon: Mart Piggs, MD Pre-operative Diagnosis:  1. Adnexal mass 2. Abnormal uterine bleeding 3. H/o Cecal CA  Post-operative Diagnosis:  1. Adhesive disease post colon resection 2. Endometrial cancer NOS 3. Adenocarcinoma unknown origin, right ovary, suspicious for GI primary  Operation:  1. Lysis of adhesions ~30 minutes 2. Robotic-assisted laparoscopic total hysterectomy with right salpingo-oophorectomy and left salpingectomy 3. Left oophorectomy (RA-laparoscopic) 4. Pelvic washings  Findings: Adhesions of omentum to anterior abdominal wall. Enlarged cystic right ovary ~10cm. Uterus had small nodules on serosa near where right adnexa was intimate with the surface. No obvious intraoperative rupture of cyst, although in 2 areas the wall was thin and one of these areas had some bleeding. Slight scarring of left bladder dome to LUS/cervix. Uterus on frozen section c/w hyperplasia and a small focus of endometrial CA - no myo invasion, <2cm in size. Frozen section on the right adnexa was carcinoma, met from colon or possibly Gyn, favor GI primary, defer to permanent. Left ovary was WNL.      09/15/2017 Cancer Staging    Staging form: Corpus Uteri - Carcinoma and Carcinosarcoma,  AJCC 8th Edition - Pathologic: FIGO Stage IA (pT1a, pN0, cM0) - Signed by Heath Lark, MD on 09/15/2017  09/26/2017 Procedure    Successful placement of a right internal jugular approach power injectable Port-A-Cath. The catheter is ready for immediate use.    11/07/2017 Imaging    1. Since 08/18/2017, similar to slight decrease in size of a pancreatic body/tail junction lesion. Cross modality comparison relative to 09/16/2017 MRI is also grossly similar. 2. No evidence of metastatic disease. 3. Aortic Atherosclerosis (ICD10-I70.0).  4. Left nephrolithiasis.    11/18/2017 Genetic Testing    MSH2 c.2237dupT pathogenic mutation identified in the CancerNext panel.  The CancerNext gene panel offered by [employer] includes sequencing and rearrangement analysis for the following 34 genes:   APC, ATM, BARD1, BMPR1A, BRCA1, BRCA2, BRIP1, CDH1, CDK4, CDKN2A, CHEK2, DICER1, HOXB13, EPCAM, GREM1, MLH1, MRE11A, MSH2, MSH6, MUTYH, NBN, NF1, PALB2, PMS2, POLD1, POLE, PTEN, RAD50, RAD51C, RAD51D, SMAD4, SMARCA4, STK11, and TP53.  The report date is November 18, 2017.  MSH2 [id number] pathogenic mutation identified on somatic testing.  These results are consistent with a diagnosis of Lynch syndrome.     Secondary malignant neoplasm of right ovary ([place])   08/23/2017 Tumor Marker    Patient's tumor was tested for the following markers: CA-125 Results of the tumor marker test revealed 139.8    08/31/2017 Initial Diagnosis    Secondary malignant neoplasm of right ovary ([place])    09/15/2017 Cancer Staging    Staging form: Ovary, Fallopian Tube, and Primary Peritoneal Carcinoma, AJCC 8th Edition - Pathologic: Stage IIB (pT2b, pN0, cM0) - Signed by Heath Lark, MD on 09/15/2017    09/27/2017 Imaging    No evidence of metastatic disease or other acute findings within the thorax.  4 cm low-attenuation mass in pancreatic tail, highly suspicious for pancreatic carcinoma. This is caused splenic vein  thrombosis, with new venous collaterals in the left upper quadrant. Consider endoscopic ultrasound with FNA for tissue diagnosis.  Stable benign hepatic hemangioma.     09/27/2017 Tumor Marker    Patient's tumor was tested for the following markers: CA-125 Results of the tumor marker test revealed 39.4    11/02/2017 Tumor Marker    Patient's tumor was tested for the following markers: CA-125 Results of the tumor marker test revealed 19    11/07/2017 Tumor Marker    Patient's tumor was tested for the following markers: CA-125 Results of the tumor marker test revealed 18.3    11/18/2017 Genetic Testing    MSH2 c.2237dupT pathogenic mutation identified in the CancerNext panel.  The CancerNext gene panel offered by [employer] includes sequencing and rearrangement analysis for the following 34 genes:   APC, ATM, BARD1, BMPR1A, BRCA1, BRCA2, BRIP1, CDH1, CDK4, CDKN2A, CHEK2, DICER1, HOXB13, EPCAM, GREM1, MLH1, MRE11A, MSH2, MSH6, MUTYH, NBN, NF1, PALB2, PMS2, POLD1, POLE, PTEN, RAD50, RAD51C, RAD51D, SMAD4, SMARCA4, STK11, and TP53.  The report date is November 18, 2017.  MSH2 [id number] pathogenic mutation identified on somatic testing.  These results are consistent with a diagnosis of Lynch syndrome.    12/15/2017 Tumor Marker    Patient's tumor was tested for the following markers: CA-125 Results of the tumor marker test revealed 57.3    01/16/2018 Tumor Marker    Patient's tumor was tested for the following markers: CA-125 Results of the tumor marker test revealed 24.2    04/10/2018 Tumor Marker    Patient's tumor was tested for the following markers: CA-125 Results of the tumor marker test revealed 18.2     Pancreatic cancer ([place])   10/13/2017 Pathology Results    Pancreas tail mass,  endoscopic ultrasound-guided, fine needle aspiration II (smears and cell block): Adenocarcinoma    11/18/2017 Genetic Testing    MSH2 c.2237dupT pathogenic mutation identified in  the CancerNext panel.  The CancerNext gene panel offered by [employer] includes sequencing and rearrangement analysis for the following 34 genes:   APC, ATM, BARD1, BMPR1A, BRCA1, BRCA2, BRIP1, CDH1, CDK4, CDKN2A, CHEK2, DICER1, HOXB13, EPCAM, GREM1, MLH1, MRE11A, MSH2, MSH6, MUTYH, NBN, NF1, PALB2, PMS2, POLD1, POLE, PTEN, RAD50, RAD51C, RAD51D, SMAD4, SMARCA4, STK11, and TP53.  The report date is November 18, 2017.  MSH2 [id number] pathogenic mutation identified on somatic testing.  These results are consistent with a diagnosis of Lynch syndrome.    11/24/2017 Pathology Results    A. "TAIL OF PANCREAS AND SPLEEN", DISTAL PANCREATECTOMY AND SPLENECTOMY: Invasive ductal adenocarcinoma, moderately to poorly differentiated with focal signet ring cell features, of pancreas (distal).   The carcinoma is 2.5 cm in greatest dimension grossly.   Treatment effects present in the form of fibrosis (50%). No lymphovascular or definite perineural invasion identified. All surgical margins are negative for tumor or high-grade dysplasia. Adjacent mucinous neoplasm, most consistent with Intraductal papillary mucinous neoplasm (IPMN) with low-grade dysplasia.   Uninvolved pancreas show atrophy and focal acute inflammation.   Twelve benign lymph nodes (0/12). Spleen with no significant histopathologic abnormalities.  PROCEDURE: distal pancreatectomy and splenectomy TUMOR SITE: distal pancreas TUMOR SIZE:  GREATEST DIMENSION: 2.5 cm  ADDITIONAL DIMENSIONS:  x  cm HISTOLOGIC TYPE: ductal adenocarcinoma HISTOLOGIC GRADE: grade 3 TUMOR EXTENSION: peripancreatic soft tissue MARGINS: negative for tumor TREATMENT EFFECT: treatment effects present in the form of fibrosis (50%). LYMPHOVASCULAR INVASION: not identified PERINEURAL INVASION: no definite evidence REGIONAL LYMPH NODES:   NUMBER OF LYMPH NODES INVOLVED: 0   NUMBER OF LYMPH  NODES EXAMINED: 12 PATHOLOGIC STAGE CLASSIFICATION (pTNM, AJCC 8th Ed): ypT2, ypN0 DISTANT METASTASIS (pM): pMx ADDITIONAL PATHOLOGIC FINDINGS: mucinous neoplasm, most consistent with intraductal papillary mucinous neoplasm (IPMN) with low-grade dysplasia, is identified adjacent to the main tumor.    11/24/2017 Cancer Staging    Staging form: Exocrine Pancreas, AJCC 8th Edition - Pathologic stage from 11/24/2017: Stage IB (pT2, pN0, cM0) - Signed by Truitt Merle, MD on 01/03/2018    11/25/2017 Surgery    She had surgery at [facility] 1. Exploratory Laparotomy 2. Distal Pancreatectomy and Splenectomy 3. Intraoperative Ultrasound 4. Open Cholecystectomy     12/15/2017 Cancer Staging    Staging form: Exocrine Pancreas, AJCC 8th Edition - Clinical: Stage IB (cT2, cN0, cM0) - Signed by Heath Lark, MD on 12/15/2017    12/28/2017 Imaging    12/28/2017 CT Abdomen  IMPRESSION: 1. Postoperative findings from recent partial pancreatectomy including a 21 cubic cm fluid collection along the pancreatic resection margin which could represent early pseudocyst. 2. Nodularity along the lateral limb of the left adrenal gland could also be postoperative but merit surveillance, as the pancreatic lesion was in close proximity to this adrenal gland on the prior CT of 11/07/2017. 3. Asymmetric fullness inferiorly in the left breast. The patient has a history of prior left breast procedures, correlation with mammography is recommended. 4. Other imaging findings of potential clinical significance: Stable hemangioma in the left hepatic lobe. Splenectomy. Aortic Atherosclerosis (ICD10-I70.0). Right hemicolectomy. Small focus of fat necrosis in the right anterior abdominal wall subcutaneous tissues near the laparotomy site. Bilateral nonobstructive nephrolithiasis.    01/02/2018 Tumor Marker    Patient's tumor was tested for the following markers: CA-19-9 Results of the tumor marker test revealed 5  01/03/2018 -   Chemotherapy    She received modified dose FOLFIRINOX    04/10/2018 Imaging    1. Distal pancreatectomy, without findings of recurrent or metastatic disease. 2. Incompletely imaged hypoenhancement within lower lobe right pulmonary artery branch is likely chronic (but interval since 10/09/2017) pulmonary embolism. Dedicated CTA could further evaluate. 3. Decrease in size of a peripancreatic complex fluid collection anteriorly, likely a resolving pseudocyst. 4. Decreased size of minimal fluid versus a borderline sized node in the gastrohepatic ligament. Recommend attention on follow-up. 5. Hepatic steatosis with a segment 4 hemangioma. 6. Aortic Atherosclerosis (ICD10-I70.0). This is significantly age advanced.     REVIEW OF SYSTEMS:   Constitutional: Denies fevers, chills or abnormal weight loss Eyes: Denies blurriness of vision Ears, nose, mouth, throat, and face: Denies mucositis or sore throat Respiratory: Denies cough, dyspnea or wheezes Cardiovascular: Denies palpitation, chest discomfort or lower extremity swelling Skin: Denies abnormal skin rashes Lymphatics: Denies new lymphadenopathy or easy bruising Behavioral/Psych: Mood is stable, no new changes  All other systems were reviewed with the patient and are negative.  I have reviewed the past medical history, past surgical history, social history and family history with the patient and they are unchanged from previous note.  ALLERGIES:  is allergic to codeine; penicillins; and bactrim [sulfamethoxazole-trimethoprim].  MEDICATIONS:  Current Outpatient Medications  Medication Sig Dispense Refill  . acetaminophen (TYLENOL) 650 MG CR tablet Take 1,300 mg by mouth every 8 (eight) hours.    Marland Kitchen dexamethasone (DECADRON) 4 MG tablet Take 1 tablet (4 mg total) by mouth 2 (two) times daily with a meal. 60 tablet 1  . diclofenac sodium (VOLTAREN) 1 % GEL APPLY 3 GM TO AFFECTED 3 LARGE JOINTS UP TO 3 TIMES A DAY AS NEEDED AS DIRECTED 300 g 1   . diphenoxylate-atropine (LOMOTIL) 2.5-0.025 MG tablet Take 1-2 tablets by mouth 4 (four) times daily as needed for diarrhea or loose stools. 1 to 2 po QID prn diarrhea 90 tablet 1  . gabapentin (NEURONTIN) 300 MG capsule TAKE 1 CAPSULE BY MOUTH THREE TIMES A DAY 270 capsule 1  . HYDROmorphone (DILAUDID) 4 MG tablet Take 1 tablet (4 mg total) by mouth every 4 (four) hours as needed for severe pain. 30 tablet 0  . hydrOXYzine (ATARAX/VISTARIL) 10 MG tablet Take 1 tablet (10 mg total) by mouth 3 (three) times daily as needed for itching. 60 tablet 1  . lidocaine-prilocaine (EMLA) cream Apply to affected area once (Patient taking differently: Apply 1 application topically daily as needed (port). Apply to affected area once) 30 g 3  . loratadine (CLARITIN) 10 MG tablet Take 10 mg by mouth daily after breakfast.    . LORazepam (ATIVAN) 0.5 MG tablet Place 1 tablet (0.5 mg total) under the tongue every 6 (six) hours as needed for anxiety. 60 tablet 1  . metFORMIN (GLUCOPHAGE) 500 MG tablet Take 2 tablets (1,000 mg total) by mouth 2 (two) times daily with a meal. 180 tablet 1  . naproxen sodium (ALEVE) 220 MG tablet Take 220 mg by mouth.    Marland Kitchen omeprazole (PRILOSEC) 20 MG capsule Take 20 mg by mouth 2 (two) times daily before a meal.     . ondansetron (ZOFRAN) 8 MG tablet Take 1 tablet (8 mg total) by mouth every 8 (eight) hours as needed for refractory nausea / vomiting. Start on day 3 after chemo. 30 tablet 1  . predniSONE (DELTASONE) 5 MG tablet Take 1 tablet (5 mg total) by mouth daily with  breakfast. 30 tablet 0  . prochlorperazine (COMPAZINE) 10 MG tablet Take 1 tablet (10 mg total) by mouth every 6 (six) hours as needed (Nausea or vomiting). 90 tablet 1  . Rivaroxaban 15 & 20 MG TBPK Take as directed on package: Start with one 12m tablet by mouth twice a day with food. On Day 22, switch to one 268mtablet once a day with food. 51 each 0  . simethicone (MYLICON) 12790G chewable tablet Chew 125 mg by  mouth every 6 (six) hours as needed for flatulence.    . triamcinolone (NASACORT) 55 MCG/ACT AERO nasal inhaler Place 2 sprays into the nose daily. 1 Inhaler 12   No current facility-administered medications for this visit.     PHYSICAL EXAMINATION: ECOG PERFORMANCE STATUS: 2 - Symptomatic, <50% confined to bed  Vitals:   04/11/18 0907  BP: 116/60  Pulse: 86  Resp: 18  Temp: 98.4 F (36.9 C)  SpO2: 100%   Filed Weights   04/11/18 0907  Weight: 218 lb 9.6 oz (99.2 kg)    GENERAL:alert, no distress and comfortable.  She looks mildly cushingoid SKIN: skin color, texture, turgor are normal, no rashes or significant lesions EYES: normal, Conjunctiva are pink and non-injected, sclera clear OROPHARYNX:no exudate, no erythema and lips, buccal mucosa, and tongue normal  NECK: supple, thyroid normal size, non-tender, without nodularity LYMPH:  no palpable lymphadenopathy in the cervical, axillary or inguinal LUNGS: clear to auscultation and percussion with normal breathing effort HEART: regular rate & rhythm and no murmurs and no lower extremity edema ABDOMEN:abdomen soft, non-tender and normal bowel sounds Musculoskeletal:no cyanosis of digits and no clubbing  NEURO: alert & oriented x 3 with fluent speech, no focal motor/sensory deficits  LABORATORY DATA:  I have reviewed the data as listed    Component Value Date/Time   NA 141 04/10/2018 1009   K 3.3 (L) 04/10/2018 1009   CL 105 04/10/2018 1009   CO2 22 04/10/2018 1009   GLUCOSE 115 (H) 04/10/2018 1009   BUN 9 04/10/2018 1009   CREATININE 0.64 04/10/2018 1009   CREATININE 0.63 02/06/2018 1001   CREATININE 0.68 07/27/2017 0851   CALCIUM 9.0 04/10/2018 1009   PROT 6.3 (L) 04/10/2018 1009   ALBUMIN 3.3 (L) 04/10/2018 1009   AST 36 04/10/2018 1009   AST 20 02/06/2018 1001   ALT 63 (H) 04/10/2018 1009   ALT 46 (H) 02/06/2018 1001   ALKPHOS 141 (H) 04/10/2018 1009   BILITOT <0.2 (L) 04/10/2018 1009   BILITOT <0.2 (L)  02/06/2018 1001   GFRNONAA >60 04/10/2018 1009   GFRNONAA >60 02/06/2018 1001   GFRNONAA 106 07/27/2017 0851   GFRAA >60 04/10/2018 1009   GFRAA >60 02/06/2018 1001   GFRAA 123 07/27/2017 0851    No results found for: SPEP, UPEP  Lab Results  Component Value Date   WBC 17.7 (H) 04/10/2018   NEUTROABS 9.8 (H) 04/10/2018   HGB 10.9 (L) 04/10/2018   HCT 35.2 (L) 04/10/2018   MCV 94.4 04/10/2018   PLT 265 04/10/2018      Chemistry      Component Value Date/Time   NA 141 04/10/2018 1009   K 3.3 (L) 04/10/2018 1009   CL 105 04/10/2018 1009   CO2 22 04/10/2018 1009   BUN 9 04/10/2018 1009   CREATININE 0.64 04/10/2018 1009   CREATININE 0.63 02/06/2018 1001   CREATININE 0.68 07/27/2017 0851      Component Value Date/Time   CALCIUM 9.0 04/10/2018 1009  ALKPHOS 141 (H) 04/10/2018 1009   AST 36 04/10/2018 1009   AST 20 02/06/2018 1001   ALT 63 (H) 04/10/2018 1009   ALT 46 (H) 02/06/2018 1001   BILITOT <0.2 (L) 04/10/2018 1009   BILITOT <0.2 (L) 02/06/2018 1001       RADIOGRAPHIC STUDIES: I have reviewed multiple imaging studies with the patient and husband I have personally reviewed the radiological images as listed and agreed with the findings in the report. Ct Abdomen Pelvis W Contrast  Result Date: 04/10/2018 CLINICAL DATA:  Colon, uterine, ovarian, and pancreatic cancer. Ongoing chemotherapy. Splenectomy. Right colectomy. Hysterectomy. Distal pancreatectomy. Cholecystectomy. EXAM: CT ABDOMEN AND PELVIS WITH CONTRAST TECHNIQUE: Multidetector CT imaging of the abdomen and pelvis was performed using the standard protocol following bolus administration of intravenous contrast. CONTRAST:  193m OMNIPAQUE IOHEXOL 300 MG/ML  SOLN COMPARISON:  12/27/2017 FINDINGS: Lower chest: Clear lung bases. Normal heart size without pericardial or pleural effusion. Incompletely imaged central line. Hypoattenuation within the medial right lower lobe pulmonary artery branch, including on image  1/2, is new including back to 10/09/2017 chest CT. Hepatobiliary: Hepatic steatosis. Segment 4A hepatic hemangioma again identified. No suspicious liver lesion. Cholecystectomy, without biliary ductal dilatation. Pancreas: Distal pancreatectomy. Normal appearance of the remaining pancreatic head. The fluid collection ventral to the pancreatic head is decreased in size at 2.2 x 1.8 cm on image 53/7. Compare 3.9 x 2.8 cm on the prior. Spleen: Splenectomy. Adrenals/Urinary Tract: Normal adrenal glands. The soft tissue thickening along the lateral limb left adrenal gland has resolved. Interpolar 4 mm left renal collecting system calculus. Normal right kidney, without hydronephrosis. Normal urinary bladder. Stomach/Bowel: Normal stomach, without wall thickening. Colonic stool burden suggests constipation. Partial right hemicolectomy. Normal small bowel. Vascular/Lymphatic: Aortic atherosclerosis. Patent portal and hepatic veins. Decrease in fluid versus a lymph node in the gastrohepatic ligament. 9 mm short axis today on image 43/7 versus 11 mm on the prior exam. No enlarging abdominopelvic nodes. Reproductive: Hysterectomy.  No adnexal mass. Other: No significant free fluid. Mild pelvic floor laxity. No evidence of omental or peritoneal disease. Fat containing right paramidline ventral abdominal wall hernias x2, small. Musculoskeletal: No acute osseous abnormality. IMPRESSION: 1. Distal pancreatectomy, without findings of recurrent or metastatic disease. 2. Incompletely imaged hypoenhancement within lower lobe right pulmonary artery branch is likely chronic (but interval since 10/09/2017) pulmonary embolism. Dedicated CTA could further evaluate. 3. Decrease in size of a peripancreatic complex fluid collection anteriorly, likely a resolving pseudocyst. 4. Decreased size of minimal fluid versus a borderline sized node in the gastrohepatic ligament. Recommend attention on follow-up. 5. Hepatic steatosis with a segment 4  hemangioma. 6. Aortic Atherosclerosis (ICD10-I70.0). This is significantly age advanced. These results will be called to the ordering clinician or representative by the Radiologist Assistant, and communication documented in the PACS or zVision Dashboard. Electronically Signed   By: KAbigail MiyamotoM.D.   On: 04/10/2018 14:16    All questions were answered. The patient knows to call the clinic with any problems, questions or concerns. No barriers to learning was detected.  I spent 40 minutes counseling the patient face to face. The total time spent in the appointment was 55 minutes and more than 50% was on counseling and review of test results  NHeath Lark MD 04/11/2018 9:58 AM

## 2018-04-11 NOTE — Assessment & Plan Note (Signed)
I plan to reduce the dose of oxaliplatin further to 50%

## 2018-04-11 NOTE — Assessment & Plan Note (Signed)
We discussed aggressive risk factor modification.  She will continue on metformin

## 2018-04-11 NOTE — Telephone Encounter (Signed)
Gave avs and calendar but waiting on approval for 3/3 chemo

## 2018-04-11 NOTE — Assessment & Plan Note (Signed)
She has mildly elevated liver enzymes, likely due to recent chemotherapy.  Observe only for now I am planning to reduce dose of chemotherapy as above.

## 2018-04-11 NOTE — Patient Instructions (Signed)
[facility] Discharge Instructions for Patients Receiving Chemotherapy  Today you received the following chemotherapy agents: Oxaliplatin (Eloxatin), Irinotecan (Camptosar), Leucovorin, Fluorouracil (Adrucil, 5-FU)  To help prevent nausea and vomiting after your treatment, we encourage you to take your nausea medication as directed.    If you develop nausea and vomiting that is not controlled by your nausea medication, call the clinic.   BELOW ARE SYMPTOMS THAT SHOULD BE REPORTED IMMEDIATELY:  *FEVER GREATER THAN 100.5 F  *CHILLS WITH OR WITHOUT FEVER  NAUSEA AND VOMITING THAT IS NOT CONTROLLED WITH YOUR NAUSEA MEDICATION  *UNUSUAL SHORTNESS OF BREATH  *UNUSUAL BRUISING OR BLEEDING  TENDERNESS IN MOUTH AND THROAT WITH OR WITHOUT PRESENCE OF ULCERS  *URINARY PROBLEMS  *BOWEL PROBLEMS  UNUSUAL RASH Items with * indicate a potential emergency and should be followed up as soon as possible.  Feel free to call the clinic should you have any questions or concerns. The clinic phone number is [phone].  Please show the [place] at check-in to the Emergency Department and triage nurse.

## 2018-04-11 NOTE — Assessment & Plan Note (Signed)
She has no signs of cancer.  Tumor marker is stable.

## 2018-04-11 NOTE — Assessment & Plan Note (Signed)
She tolerated last treatment poorly with significant diarrhea, changes in blood count, abnormal liver enzymes, neuropathy and fatigue I recommend continue on reduced dose of irinotecan, 5-FU and further reduced dose oxaliplatin at 50% CT imaging show no evidence of cancer.  The fluid collection near her pancreas is resolving I recommend 3 more cycles after today's treatment to complete 10 cycles of chemotherapy before stopping.

## 2018-04-11 NOTE — Assessment & Plan Note (Signed)
She has incidental finding of pulmonary embolism.  She is somewhat symptomatic with shortness of breath on minimal exertion but denies chest pain We discussed the risk and benefits of ordering further imaging studies such as CT angiogram, venous Doppler or echocardiogram but at present time, she is comfortable to just be managed with anticoagulation therapy without further tests. We discussed the risk, benefits, side effects of anticoagulation therapy and she is doing well with Xarelto since yesterday. The plan will be to continue on treatment for minimum 6 months to a year.

## 2018-04-13 ENCOUNTER — Inpatient Hospital Stay: Payer: BLUE CROSS/BLUE SHIELD

## 2018-04-13 VITALS — BP 113/91 | HR 91 | Resp 18

## 2018-04-13 DIAGNOSIS — Z1509 Genetic susceptibility to other malignant neoplasm: Secondary | ICD-10-CM | POA: Diagnosis not present

## 2018-04-13 DIAGNOSIS — M898X9 Other specified disorders of bone, unspecified site: Secondary | ICD-10-CM | POA: Diagnosis not present

## 2018-04-13 DIAGNOSIS — C541 Malignant neoplasm of endometrium: Secondary | ICD-10-CM | POA: Diagnosis not present

## 2018-04-13 DIAGNOSIS — Z8543 Personal history of malignant neoplasm of ovary: Secondary | ICD-10-CM | POA: Diagnosis not present

## 2018-04-13 DIAGNOSIS — G8929 Other chronic pain: Secondary | ICD-10-CM | POA: Diagnosis not present

## 2018-04-13 DIAGNOSIS — C252 Malignant neoplasm of tail of pancreas: Secondary | ICD-10-CM

## 2018-04-13 DIAGNOSIS — T451X5A Adverse effect of antineoplastic and immunosuppressive drugs, initial encounter: Secondary | ICD-10-CM | POA: Diagnosis not present

## 2018-04-13 DIAGNOSIS — Z9081 Acquired absence of spleen: Secondary | ICD-10-CM | POA: Diagnosis not present

## 2018-04-13 DIAGNOSIS — Z5189 Encounter for other specified aftercare: Secondary | ICD-10-CM | POA: Diagnosis not present

## 2018-04-13 DIAGNOSIS — R110 Nausea: Secondary | ICD-10-CM | POA: Diagnosis not present

## 2018-04-13 DIAGNOSIS — Z9181 History of falling: Secondary | ICD-10-CM | POA: Diagnosis not present

## 2018-04-13 DIAGNOSIS — T380X5A Adverse effect of glucocorticoids and synthetic analogues, initial encounter: Secondary | ICD-10-CM | POA: Diagnosis not present

## 2018-04-13 DIAGNOSIS — T451X5D Adverse effect of antineoplastic and immunosuppressive drugs, subsequent encounter: Secondary | ICD-10-CM | POA: Diagnosis not present

## 2018-04-13 DIAGNOSIS — I2699 Other pulmonary embolism without acute cor pulmonale: Secondary | ICD-10-CM | POA: Diagnosis not present

## 2018-04-13 DIAGNOSIS — G620 Drug-induced polyneuropathy: Secondary | ICD-10-CM | POA: Diagnosis not present

## 2018-04-13 DIAGNOSIS — R197 Diarrhea, unspecified: Secondary | ICD-10-CM | POA: Diagnosis not present

## 2018-04-13 DIAGNOSIS — Z79899 Other long term (current) drug therapy: Secondary | ICD-10-CM | POA: Diagnosis not present

## 2018-04-13 DIAGNOSIS — Z5111 Encounter for antineoplastic chemotherapy: Secondary | ICD-10-CM | POA: Diagnosis not present

## 2018-04-13 DIAGNOSIS — E099 Drug or chemical induced diabetes mellitus without complications: Secondary | ICD-10-CM | POA: Diagnosis not present

## 2018-04-13 DIAGNOSIS — E860 Dehydration: Secondary | ICD-10-CM | POA: Diagnosis not present

## 2018-04-13 DIAGNOSIS — Z5181 Encounter for therapeutic drug level monitoring: Secondary | ICD-10-CM | POA: Diagnosis not present

## 2018-04-13 DIAGNOSIS — Z452 Encounter for adjustment and management of vascular access device: Secondary | ICD-10-CM | POA: Diagnosis not present

## 2018-04-13 DIAGNOSIS — C7961 Secondary malignant neoplasm of right ovary: Secondary | ICD-10-CM | POA: Diagnosis not present

## 2018-04-13 DIAGNOSIS — M170 Bilateral primary osteoarthritis of knee: Secondary | ICD-10-CM | POA: Diagnosis not present

## 2018-04-13 DIAGNOSIS — Z7952 Long term (current) use of systemic steroids: Secondary | ICD-10-CM | POA: Diagnosis not present

## 2018-04-13 MED ORDER — PEGFILGRASTIM INJECTION 6 MG/0.6ML ~~LOC~~
PREFILLED_SYRINGE | SUBCUTANEOUS | Status: AC
  Filled 2018-04-13: qty 0.6

## 2018-04-13 MED ORDER — PEGFILGRASTIM INJECTION 6 MG/0.6ML ~~LOC~~
6.0000 mg | PREFILLED_SYRINGE | Freq: Once | SUBCUTANEOUS | Status: AC
  Administered 2018-04-13: 6 mg via SUBCUTANEOUS

## 2018-04-13 MED ORDER — SODIUM CHLORIDE 0.9% FLUSH
10.0000 mL | INTRAVENOUS | Status: DC | PRN
  Administered 2018-04-13: 10 mL
  Filled 2018-04-13: qty 10

## 2018-04-13 MED ORDER — HEPARIN SOD (PORK) LOCK FLUSH 100 UNIT/ML IV SOLN
500.0000 [IU] | Freq: Once | INTRAVENOUS | Status: AC | PRN
  Administered 2018-04-13: 500 [IU]
  Filled 2018-04-13: qty 5

## 2018-04-17 DIAGNOSIS — Z9181 History of falling: Secondary | ICD-10-CM | POA: Diagnosis not present

## 2018-04-17 DIAGNOSIS — M170 Bilateral primary osteoarthritis of knee: Secondary | ICD-10-CM | POA: Diagnosis not present

## 2018-04-17 DIAGNOSIS — C252 Malignant neoplasm of tail of pancreas: Secondary | ICD-10-CM | POA: Diagnosis not present

## 2018-04-17 DIAGNOSIS — Z452 Encounter for adjustment and management of vascular access device: Secondary | ICD-10-CM | POA: Diagnosis not present

## 2018-04-17 DIAGNOSIS — Z5181 Encounter for therapeutic drug level monitoring: Secondary | ICD-10-CM | POA: Diagnosis not present

## 2018-04-17 DIAGNOSIS — Z7952 Long term (current) use of systemic steroids: Secondary | ICD-10-CM | POA: Diagnosis not present

## 2018-04-17 DIAGNOSIS — Z8543 Personal history of malignant neoplasm of ovary: Secondary | ICD-10-CM | POA: Diagnosis not present

## 2018-04-17 DIAGNOSIS — T451X5D Adverse effect of antineoplastic and immunosuppressive drugs, subsequent encounter: Secondary | ICD-10-CM | POA: Diagnosis not present

## 2018-04-17 DIAGNOSIS — G620 Drug-induced polyneuropathy: Secondary | ICD-10-CM | POA: Diagnosis not present

## 2018-04-17 DIAGNOSIS — Z79899 Other long term (current) drug therapy: Secondary | ICD-10-CM | POA: Diagnosis not present

## 2018-04-17 DIAGNOSIS — C7961 Secondary malignant neoplasm of right ovary: Secondary | ICD-10-CM | POA: Diagnosis not present

## 2018-04-17 DIAGNOSIS — R110 Nausea: Secondary | ICD-10-CM | POA: Diagnosis not present

## 2018-04-17 DIAGNOSIS — C541 Malignant neoplasm of endometrium: Secondary | ICD-10-CM | POA: Diagnosis not present

## 2018-04-20 ENCOUNTER — Other Ambulatory Visit (INDEPENDENT_AMBULATORY_CARE_PROVIDER_SITE_OTHER): Payer: Self-pay | Admitting: Rheumatology

## 2018-04-20 ENCOUNTER — Telehealth: Payer: Self-pay

## 2018-04-20 NOTE — Telephone Encounter (Signed)
pls give verbal order to extend prior orders for next 60 days

## 2018-04-20 NOTE — Telephone Encounter (Signed)
[facility] called and left a message. Requesting orders to continue IV normal saline and Zofran. Call back number # [phone].

## 2018-04-20 NOTE — Telephone Encounter (Signed)
Last Visit: 12/26/17 Next Visit: 05/15/18  Okay to refill per Dr. Estanislado Pandy

## 2018-04-20 NOTE — Telephone Encounter (Signed)
Called and given below message to extend orders to Coretta at [facility]. She verbalized understanding.

## 2018-04-22 ENCOUNTER — Other Ambulatory Visit: Payer: Self-pay | Admitting: Rheumatology

## 2018-04-24 NOTE — Telephone Encounter (Signed)
Last Visit: 12/26/17 Next Visit: 05/15/18  Okay to refill per Dr. Estanislado Pandy

## 2018-04-25 ENCOUNTER — Inpatient Hospital Stay: Payer: BLUE CROSS/BLUE SHIELD | Attending: Hematology and Oncology

## 2018-04-25 ENCOUNTER — Encounter: Payer: Self-pay | Admitting: Hematology and Oncology

## 2018-04-25 ENCOUNTER — Inpatient Hospital Stay: Payer: BLUE CROSS/BLUE SHIELD

## 2018-04-25 ENCOUNTER — Inpatient Hospital Stay: Payer: BLUE CROSS/BLUE SHIELD | Admitting: Hematology and Oncology

## 2018-04-25 DIAGNOSIS — Z79899 Other long term (current) drug therapy: Secondary | ICD-10-CM | POA: Diagnosis not present

## 2018-04-25 DIAGNOSIS — C7961 Secondary malignant neoplasm of right ovary: Secondary | ICD-10-CM

## 2018-04-25 DIAGNOSIS — C541 Malignant neoplasm of endometrium: Secondary | ICD-10-CM

## 2018-04-25 DIAGNOSIS — C252 Malignant neoplasm of tail of pancreas: Secondary | ICD-10-CM

## 2018-04-25 DIAGNOSIS — I2699 Other pulmonary embolism without acute cor pulmonale: Secondary | ICD-10-CM | POA: Insufficient documentation

## 2018-04-25 DIAGNOSIS — R197 Diarrhea, unspecified: Secondary | ICD-10-CM | POA: Diagnosis not present

## 2018-04-25 DIAGNOSIS — M170 Bilateral primary osteoarthritis of knee: Secondary | ICD-10-CM

## 2018-04-25 DIAGNOSIS — Z5111 Encounter for antineoplastic chemotherapy: Secondary | ICD-10-CM | POA: Diagnosis not present

## 2018-04-25 DIAGNOSIS — K521 Toxic gastroenteritis and colitis: Secondary | ICD-10-CM | POA: Diagnosis not present

## 2018-04-25 DIAGNOSIS — R748 Abnormal levels of other serum enzymes: Secondary | ICD-10-CM

## 2018-04-25 DIAGNOSIS — Z1509 Genetic susceptibility to other malignant neoplasm: Secondary | ICD-10-CM | POA: Insufficient documentation

## 2018-04-25 DIAGNOSIS — E099 Drug or chemical induced diabetes mellitus without complications: Secondary | ICD-10-CM | POA: Diagnosis not present

## 2018-04-25 DIAGNOSIS — G620 Drug-induced polyneuropathy: Secondary | ICD-10-CM

## 2018-04-25 DIAGNOSIS — R112 Nausea with vomiting, unspecified: Secondary | ICD-10-CM | POA: Diagnosis not present

## 2018-04-25 DIAGNOSIS — Z7901 Long term (current) use of anticoagulants: Secondary | ICD-10-CM | POA: Insufficient documentation

## 2018-04-25 DIAGNOSIS — T451X5A Adverse effect of antineoplastic and immunosuppressive drugs, initial encounter: Secondary | ICD-10-CM

## 2018-04-25 DIAGNOSIS — T380X5A Adverse effect of glucocorticoids and synthetic analogues, initial encounter: Secondary | ICD-10-CM

## 2018-04-25 LAB — CBC WITH DIFFERENTIAL (CANCER CENTER ONLY)
Abs Immature Granulocytes: 2.47 10*3/uL — ABNORMAL HIGH (ref 0.00–0.07)
Basophils Absolute: 0.1 10*3/uL (ref 0.0–0.1)
Basophils Relative: 0 %
Eosinophils Absolute: 0 10*3/uL (ref 0.0–0.5)
Eosinophils Relative: 0 %
HCT: 33.8 % — ABNORMAL LOW (ref 36.0–46.0)
Hemoglobin: 10.2 g/dL — ABNORMAL LOW (ref 12.0–15.0)
Immature Granulocytes: 8 %
Lymphocytes Relative: 16 %
Lymphs Abs: 5 10*3/uL — ABNORMAL HIGH (ref 0.7–4.0)
MCH: 29.6 pg (ref 26.0–34.0)
MCHC: 30.2 g/dL (ref 30.0–36.0)
MCV: 98 fL (ref 80.0–100.0)
Monocytes Absolute: 2 10*3/uL — ABNORMAL HIGH (ref 0.1–1.0)
Monocytes Relative: 6 %
Neutro Abs: 22.4 10*3/uL — ABNORMAL HIGH (ref 1.7–7.7)
Neutrophils Relative %: 70 %
Platelet Count: 341 10*3/uL (ref 150–400)
RBC: 3.45 MIL/uL — ABNORMAL LOW (ref 3.87–5.11)
RDW: 22.4 % — ABNORMAL HIGH (ref 11.5–15.5)
WBC Count: 32 10*3/uL — ABNORMAL HIGH (ref 4.0–10.5)
nRBC: 1 % — ABNORMAL HIGH (ref 0.0–0.2)

## 2018-04-25 LAB — CMP (CANCER CENTER ONLY)
ALT: 59 U/L — ABNORMAL HIGH (ref 0–44)
AST: 29 U/L (ref 15–41)
Albumin: 3 g/dL — ABNORMAL LOW (ref 3.5–5.0)
Alkaline Phosphatase: 155 U/L — ABNORMAL HIGH (ref 38–126)
Anion gap: 11 (ref 5–15)
BUN: 7 mg/dL (ref 6–20)
CO2: 23 mmol/L (ref 22–32)
Calcium: 8.8 mg/dL — ABNORMAL LOW (ref 8.9–10.3)
Chloride: 108 mmol/L (ref 98–111)
Creatinine: 0.66 mg/dL (ref 0.44–1.00)
GFR, Est AFR Am: >60 mL/min (ref >60)
GFR, Estimated: >60 mL/min (ref >60)
Glucose, Bld: 128 mg/dL — ABNORMAL HIGH (ref 70–99)
Potassium: 3.8 mmol/L (ref 3.5–5.1)
Sodium: 142 mmol/L (ref 135–145)
Total Bilirubin: <0.2 mg/dL — ABNORMAL LOW (ref 0.3–1.2)
Total Protein: 6.3 g/dL — ABNORMAL LOW (ref 6.5–8.1)

## 2018-04-25 LAB — MAGNESIUM: Magnesium: 1.8 mg/dL (ref 1.7–2.4)

## 2018-04-25 MED ORDER — RIVAROXABAN 20 MG PO TABS: 20.0000 mg | ORAL_TABLET | Freq: Every day | ORAL | 9 refills | Status: DC

## 2018-04-25 MED ORDER — DIPHENHYDRAMINE HCL 50 MG/ML IJ SOLN
50.0000 mg | Freq: Once | INTRAMUSCULAR | Status: AC
  Administered 2018-04-25: 50 mg via INTRAVENOUS

## 2018-04-25 MED ORDER — FAMOTIDINE IN NACL 20-0.9 MG/50ML-% IV SOLN: 20.0000 mg | Freq: Once | INTRAVENOUS | Status: DC

## 2018-04-25 MED ORDER — LEUCOVORIN CALCIUM INJECTION 350 MG
400.0000 mg/m2 | Freq: Once | INTRAVENOUS | Status: AC
  Administered 2018-04-25: 828 mg via INTRAVENOUS
  Filled 2018-04-25: qty 41.4

## 2018-04-25 MED ORDER — OXALIPLATIN CHEMO INJECTION 100 MG/20ML
42.5000 mg/m2 | Freq: Once | INTRAVENOUS | Status: AC
  Administered 2018-04-25: 90 mg via INTRAVENOUS
  Filled 2018-04-25: qty 18

## 2018-04-25 MED ORDER — SODIUM CHLORIDE 0.9% FLUSH
10.0000 mL | Freq: Once | INTRAVENOUS | Status: AC
  Administered 2018-04-25: 10 mL
  Filled 2018-04-25: qty 10

## 2018-04-25 MED ORDER — GABAPENTIN 300 MG PO CAPS: ORAL_CAPSULE | ORAL | 11 refills | Status: DC

## 2018-04-25 MED ORDER — DIPHENOXYLATE-ATROPINE 2.5-0.025 MG PO TABS: 1.0000 | ORAL_TABLET | Freq: Four times a day (QID) | ORAL | 1 refills | Status: DC | PRN

## 2018-04-25 MED ORDER — METFORMIN HCL 500 MG PO TABS: 1000.0000 mg | ORAL_TABLET | Freq: Two times a day (BID) | ORAL | 1 refills | Status: DC

## 2018-04-25 MED ORDER — SODIUM CHLORIDE 0.9 % IV SOLN
20.0000 mg | Freq: Once | INTRAVENOUS | Status: AC
  Administered 2018-04-25: 20 mg via INTRAVENOUS
  Filled 2018-04-25: qty 2

## 2018-04-25 MED ORDER — SODIUM CHLORIDE 0.9% FLUSH
10.0000 mL | INTRAVENOUS | Status: DC | PRN
  Administered 2018-04-25: 10 mL
  Filled 2018-04-25: qty 10

## 2018-04-25 MED ORDER — PALONOSETRON HCL INJECTION 0.25 MG/5ML
0.2500 mg | Freq: Once | INTRAVENOUS | Status: AC
  Administered 2018-04-25: 0.25 mg via INTRAVENOUS

## 2018-04-25 MED ORDER — DEXTROSE 5 % IV SOLN
Freq: Once | INTRAVENOUS | Status: AC
  Administered 2018-04-25: 10:00:00 via INTRAVENOUS
  Filled 2018-04-25: qty 250

## 2018-04-25 MED ORDER — ATROPINE SULFATE 1 MG/ML IJ SOLN
INTRAMUSCULAR | Status: AC
  Filled 2018-04-25: qty 1

## 2018-04-25 MED ORDER — PALONOSETRON HCL INJECTION 0.25 MG/5ML
INTRAVENOUS | Status: AC
  Filled 2018-04-25: qty 5

## 2018-04-25 MED ORDER — IRINOTECAN HCL CHEMO INJECTION 100 MG/5ML
100.0000 mg/m2 | Freq: Once | INTRAVENOUS | Status: AC
  Administered 2018-04-25: 200 mg via INTRAVENOUS
  Filled 2018-04-25: qty 10

## 2018-04-25 MED ORDER — DIPHENHYDRAMINE HCL 50 MG/ML IJ SOLN
INTRAMUSCULAR | Status: AC
  Filled 2018-04-25: qty 1

## 2018-04-25 MED ORDER — SODIUM CHLORIDE 0.9 % IV SOLN
Freq: Once | INTRAVENOUS | Status: AC
  Administered 2018-04-25: 10:00:00 via INTRAVENOUS
  Filled 2018-04-25: qty 5

## 2018-04-25 MED ORDER — ATROPINE SULFATE 1 MG/ML IJ SOLN
0.5000 mg | Freq: Once | INTRAMUSCULAR | Status: AC | PRN
  Administered 2018-04-25: 0.5 mg via INTRAVENOUS

## 2018-04-25 MED ORDER — DEXTROSE 5 % IV SOLN
Freq: Once | INTRAVENOUS | Status: AC
  Administered 2018-04-25: 13:00:00 via INTRAVENOUS
  Filled 2018-04-25: qty 250

## 2018-04-25 MED ORDER — SODIUM CHLORIDE 0.9 % IV SOLN
1920.0000 mg/m2 | INTRAVENOUS | Status: DC
  Administered 2018-04-25: 3950 mg via INTRAVENOUS
  Filled 2018-04-25: qty 79

## 2018-04-25 NOTE — Assessment & Plan Note (Signed)
She is contemplating surgery I do not recommend knee surgery due to her recent diagnosis of blood clot

## 2018-04-25 NOTE — Patient Instructions (Signed)
[facility] Discharge Instructions for Patients Receiving Chemotherapy  Today you received the following chemotherapy agents: Oxaliplatin (Eloxatin), Irinotecan (Camptosar), Leucovorin, Fluorouracil (Adrucil, 5-FU)  To help prevent nausea and vomiting after your treatment, we encourage you to take your nausea medication as directed.    If you develop nausea and vomiting that is not controlled by your nausea medication, call the clinic.   BELOW ARE SYMPTOMS THAT SHOULD BE REPORTED IMMEDIATELY:  *FEVER GREATER THAN 100.5 F  *CHILLS WITH OR WITHOUT FEVER  NAUSEA AND VOMITING THAT IS NOT CONTROLLED WITH YOUR NAUSEA MEDICATION  *UNUSUAL SHORTNESS OF BREATH  *UNUSUAL BRUISING OR BLEEDING  TENDERNESS IN MOUTH AND THROAT WITH OR WITHOUT PRESENCE OF ULCERS  *URINARY PROBLEMS  *BOWEL PROBLEMS  UNUSUAL RASH Items with * indicate a potential emergency and should be followed up as soon as possible.  Feel free to call the clinic should you have any questions or concerns. The clinic phone number is [phone].  Please show the [place] at check-in to the Emergency Department and triage nurse.

## 2018-04-25 NOTE — Assessment & Plan Note (Signed)
Her blood sugar control has improved dramatically with the addition of metformin and dexamethasone taper We will continue to same for now

## 2018-04-25 NOTE — Assessment & Plan Note (Signed)
With recent dose adjustment, she tolerated chemotherapy very well without major side effects such as nausea, severe neuropathy or diarrhea Her surgeon seems to think that her diarrhea could be unrelated to treatment I recommend dexamethasone taper to once a day for 3 days after chemo If she tolerate that well, we will discontinue dexamethasone completely We will proceed with treatment without delay and with similar dose adjustment as before

## 2018-04-25 NOTE — Progress Notes (Signed)
[place] OFFICE PROGRESS NOTE  Patient Care Team: [name], MD as PCP - General (Family Medicine)  ASSESSMENT & PLAN:  Pancreatic cancer [facility]) With recent dose adjustment, she tolerated chemotherapy very well without major side effects such as nausea, severe neuropathy or diarrhea Her surgeon seems to think that her diarrhea could be unrelated to treatment I recommend dexamethasone taper to once a day for 3 days after chemo If she tolerate that well, we will discontinue dexamethasone completely We will proceed with treatment without delay and with similar dose adjustment as before  Pulmonary embolism ([place]) She has incidental finding of pulmonary embolism.  We discussed the risk, benefits, side effects of anticoagulation therapy. The plan will be to continue on treatment for minimum 6 months to a year. For that reason, I do not recommend her to interrupt anticoagulation therapy for elective procedures if possible  Peripheral neuropathy due to chemotherapy ([place]) I plan to continue on reduced dose oxaliplatin since she tolerated that better She will also continue taking gabapentin and pain medicine as needed  Osteoarthritis of both knees She is contemplating surgery I do not recommend knee surgery due to her recent diagnosis of blood clot  Steroid-induced diabetes ([place]) Her blood sugar control has improved dramatically with the addition of metformin and dexamethasone taper We will continue to same for now  Elevated liver enzymes She has mildly elevated liver enzymes, likely due to recent chemotherapy.  Observe only for now I am planning to continue on reduced dose of chemotherapy as above.     No orders of the defined types were placed in this encounter.   INTERVAL HISTORY: Please see below for problem oriented charting. She returns with her husband for further follow-up and chemotherapy She tolerated last cycle of treatment the best with dose  modification She has met with her surgeon and have received a copy of the report Some of the things discussed with her including work-up for diabetes and possible malabsorption Pancreatic enzyme supplementation has been discussed She denies worsening neuropathy She rarely take pain medicine if she can avoid it for her arthritis pain She is wondering whether knee surgery after chemotherapy is feasible The patient denies any recent signs or symptoms of bleeding such as spontaneous epistaxis, hematuria or hematochezia. She denies excessive nausea.  Diarrhea is well controlled with Imodium and Lomotil as needed She has rare occasional abdominal pain. 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 Mixed attenuation mass emanates from the right adnexa measuring 10.8 x 8.0 cm very suspicious for right ovarian carcinoma. 2. Abnormality of the tail of the pancreas may be due to mild pancreatitis and small pseudocyst formation, but neoplasm cannot be excluded. 3. Small amount of ascites within abdomen and pelvis. 4. Small nonobstructing renal calculi bilaterally.     08/30/2017 Pathology Results    1. Uterus and cervix, with left fallopian tube - ENDOMETRIOID ADENOCARCINOMA, FIGO GRADE I, ARISING IN A BACKGROUND OF DIFFUSE COMPLEX ATYPICAL HYPERPLASIA. - CARCINOMA INVADES FOR OF DEPTH OF 0.2 CM WHERE THICKNESS OF MYOMETRIAL WALL IS 2.1 CM. - ALL RESECTION MARGINS ARE NEGATIVE FOR CARCINOMA. - NEGATIVE FOR LYMPHOVASCULAR OR PERINEURAL INVASION. - CERVICAL STROMA  IS NOT INVOLVED. - SEE ONCOLOGY TABLE. - SEE NOTE 2. Ovary and fallopian tube, right - PRIMARY OVARIAN ENDOMETRIOID ADENOCARCINOMA,  FIGO GRADE II, 12 CM. - THE OVARIAN SURFACE IS FOCALLY INVOLVED BY CARCINOMA. - NEGATIVE FOR LYMPHOVASCULAR INVASION. - BENIGN UNREMARKABLE FALLOPIAN TUBE, NEGATIVE FOR CARCINOMA. - SEE ONCOLOGY TABLE. - SEE NOTE 3. Cul-de-sac biopsy - METASTATIC ADENOCARCINOMA, MOST CONSISTENT WITH PRIMARY OVARIAN ENDOMETRIOID ADENOCARCINOMA. 4. Ovary, left - BENIGN UNREMARKABLE OVARY, NEGATIVE FOR MALIGNANCY. Microscopic Comment 1. UTERUS, CARCINOMA OR CARCINOSARCOMA Procedure: Total hysterectomy with bilateral salpingo-oophorectomy. Histologic type: Endometrioid adenocarcinoma. Histologic Grade: FIGO Grade I Myometrial invasion: Depth of invasion: 2 mm Myometrial thickness: 21 mm Uterine Serosa Involvement: Not identified Cervical stromal involvement: Not identified Extent of involvement of other organs: Not applicable Lymphovascular invasion: Not identified Regional Lymph Nodes: Examined: 0 Sentinel 0 Non-sentinel 0 Total Tumor block for ancillary studies: 1H MMR / MSI testing: Pending Pathologic Stage Classification (pTNM, AJCC 8th edition): pT1a, pNX (v4.1.0.0) 2. OVARY or FALLOPIAN TUBE or PRIMARY PERITONEUM: Procedure: Salpingo-oophorectomy Specimen Integrity: Intact Tumor Site: Right ovary Ovarian Surface Involvement (required only if applicable): Focally involved by carcinoma Fallopian Tube Surface Involvement (required only if applicable): Not identified Tumor Size: 12 cm Histologic Type: Endometrioid adenocarcinoma Histologic Grade: Grade II Implants (required for advanced stage serous/seromucinous borderline tumors only): Not applicable Other Tissue/ Organ Involvement: Cul de sac biopsy involved by tumor Largest Extrapelvic Peritoneal Focus (required only if applicable): Not applicable Peritoneal/Ascitic Fluid: Negative for carcinoma (case # [id number]) Treatment Effect (required only for high-grade serous carcinomas): Not applicable Regional Lymph Nodes: No lymph nodes  submitted or found Number of Lymph Nodes Examined: 0 Pathologic Stage Classification (pTNM, AJCC 8th Edition): pT2b, pN0 Representative Tumor Block: 2B and 2E 1. Molecular study for microsatellite instability and immunohistochemical stains for MMR-related proteins are pending and will be reported in an addendum. 2. Immunohistochemical stain show that the ovarian tumor is positive for CK7 and PAX8 (both diffuse), CDX2 (patchy and weak); and negative for CK20. This immunoprofile is consistent with the above diagnosis. Dr. Lyndon Code has reviewed this case and concurs with the above diagnosis. Molecular study for microsatellite instability and immunohistochemical stains for MMR-related proteins are pending and will be reported in an addendum    08/30/2017 Genetic Testing    Patient has genetic testing done for MSI  Results revealed patient has the following mutation(s): loss of [facility] 2    08/30/2017 Surgery    Surgeon: Mart Piggs, MD Pre-operative Diagnosis:  1. Adnexal mass 2. Abnormal uterine bleeding 3. H/o Cecal CA  Post-operative Diagnosis:  1. Adhesive disease post colon resection 2. Endometrial cancer NOS 3. Adenocarcinoma unknown origin, right ovary, suspicious for GI primary  Operation:  1. Lysis of adhesions ~30 minutes 2. Robotic-assisted laparoscopic total hysterectomy with right salpingo-oophorectomy and left salpingectomy 3. Left oophorectomy (RA-laparoscopic) 4. Pelvic washings  Findings: Adhesions of omentum to anterior abdominal wall. Enlarged cystic right ovary ~10cm. Uterus had small nodules on serosa near where right adnexa was intimate with the surface. No obvious intraoperative rupture of cyst, although in 2 areas the wall was thin and one of these areas had some bleeding. Slight scarring of left bladder dome to LUS/cervix. Uterus on frozen section c/w hyperplasia and a small focus of endometrial CA - no myo invasion, <2cm in size. Frozen section on the right adnexa  was carcinoma, met from colon or possibly Gyn, favor GI primary, defer to permanent. Left ovary was WNL.      09/15/2017 Cancer Staging  Staging form: Corpus Uteri - Carcinoma and Carcinosarcoma, AJCC 8th Edition - Pathologic: FIGO Stage IA (pT1a, pN0, cM0) - Signed by Heath Lark, MD on 09/15/2017    09/26/2017 Procedure    Successful placement of a right internal jugular approach power injectable Port-A-Cath. The catheter is ready for immediate use.    11/07/2017 Imaging    1. Since 08/18/2017, similar to slight decrease in size of a pancreatic body/tail junction lesion. Cross modality comparison relative to 09/16/2017 MRI is also grossly similar. 2. No evidence of metastatic disease. 3. Aortic Atherosclerosis (ICD10-I70.0).  4. Left nephrolithiasis.    11/18/2017 Genetic Testing    MSH2 c.2237dupT pathogenic mutation identified in the CancerNext panel.  The CancerNext gene panel offered by [employer] includes sequencing and rearrangement analysis for the following 34 genes:   APC, ATM, BARD1, BMPR1A, BRCA1, BRCA2, BRIP1, CDH1, CDK4, CDKN2A, CHEK2, DICER1, HOXB13, EPCAM, GREM1, MLH1, MRE11A, MSH2, MSH6, MUTYH, NBN, NF1, PALB2, PMS2, POLD1, POLE, PTEN, RAD50, RAD51C, RAD51D, SMAD4, SMARCA4, STK11, and TP53.  The report date is November 18, 2017.  MSH2 [id number] pathogenic mutation identified on somatic testing.  These results are consistent with a diagnosis of Lynch syndrome.     Secondary malignant neoplasm of right ovary ([place])   08/23/2017 Tumor Marker    Patient's tumor was tested for the following markers: CA-125 Results of the tumor marker test revealed 139.8    08/31/2017 Initial Diagnosis    Secondary malignant neoplasm of right ovary ([place])    09/15/2017 Cancer Staging    Staging form: Ovary, Fallopian Tube, and Primary Peritoneal Carcinoma, AJCC 8th Edition - Pathologic: Stage IIB (pT2b, pN0, cM0) - Signed by Heath Lark, MD on 09/15/2017    09/27/2017 Imaging     No evidence of metastatic disease or other acute findings within the thorax.  4 cm low-attenuation mass in pancreatic tail, highly suspicious for pancreatic carcinoma. This is caused splenic vein thrombosis, with new venous collaterals in the left upper quadrant. Consider endoscopic ultrasound with FNA for tissue diagnosis.  Stable benign hepatic hemangioma.     09/27/2017 Tumor Marker    Patient's tumor was tested for the following markers: CA-125 Results of the tumor marker test revealed 39.4    11/02/2017 Tumor Marker    Patient's tumor was tested for the following markers: CA-125 Results of the tumor marker test revealed 19    11/07/2017 Tumor Marker    Patient's tumor was tested for the following markers: CA-125 Results of the tumor marker test revealed 18.3    11/18/2017 Genetic Testing    MSH2 c.2237dupT pathogenic mutation identified in the CancerNext panel.  The CancerNext gene panel offered by [employer] includes sequencing and rearrangement analysis for the following 34 genes:   APC, ATM, BARD1, BMPR1A, BRCA1, BRCA2, BRIP1, CDH1, CDK4, CDKN2A, CHEK2, DICER1, HOXB13, EPCAM, GREM1, MLH1, MRE11A, MSH2, MSH6, MUTYH, NBN, NF1, PALB2, PMS2, POLD1, POLE, PTEN, RAD50, RAD51C, RAD51D, SMAD4, SMARCA4, STK11, and TP53.  The report date is November 18, 2017.  MSH2 [id number] pathogenic mutation identified on somatic testing.  These results are consistent with a diagnosis of Lynch syndrome.    12/15/2017 Tumor Marker    Patient's tumor was tested for the following markers: CA-125 Results of the tumor marker test revealed 57.3    01/16/2018 Tumor Marker    Patient's tumor was tested for the following markers: CA-125 Results of the tumor marker test revealed 24.2    04/10/2018 Tumor Marker    Patient's tumor was tested for  the following markers: CA-125 Results of the tumor marker test revealed 18.2     Pancreatic cancer ([place])   10/13/2017 Pathology Results    Pancreas  tail mass, endoscopic ultrasound-guided, fine needle aspiration II (smears and cell block): Adenocarcinoma    11/18/2017 Genetic Testing    MSH2 c.2237dupT pathogenic mutation identified in the CancerNext panel.  The CancerNext gene panel offered by [employer] includes sequencing and rearrangement analysis for the following 34 genes:   APC, ATM, BARD1, BMPR1A, BRCA1, BRCA2, BRIP1, CDH1, CDK4, CDKN2A, CHEK2, DICER1, HOXB13, EPCAM, GREM1, MLH1, MRE11A, MSH2, MSH6, MUTYH, NBN, NF1, PALB2, PMS2, POLD1, POLE, PTEN, RAD50, RAD51C, RAD51D, SMAD4, SMARCA4, STK11, and TP53.  The report date is November 18, 2017.  MSH2 [id number] pathogenic mutation identified on somatic testing.  These results are consistent with a diagnosis of Lynch syndrome.    11/24/2017 Pathology Results    A. "TAIL OF PANCREAS AND SPLEEN", DISTAL PANCREATECTOMY AND SPLENECTOMY: Invasive ductal adenocarcinoma, moderately to poorly differentiated with focal signet ring cell features, of pancreas (distal).   The carcinoma is 2.5 cm in greatest dimension grossly.   Treatment effects present in the form of fibrosis (50%). No lymphovascular or definite perineural invasion identified. All surgical margins are negative for tumor or high-grade dysplasia. Adjacent mucinous neoplasm, most consistent with Intraductal papillary mucinous neoplasm (IPMN) with low-grade dysplasia.   Uninvolved pancreas show atrophy and focal acute inflammation.   Twelve benign lymph nodes (0/12). Spleen with no significant histopathologic abnormalities.  PROCEDURE: distal pancreatectomy and splenectomy TUMOR SITE: distal pancreas TUMOR SIZE:  GREATEST DIMENSION: 2.5 cm  ADDITIONAL DIMENSIONS:  x  cm HISTOLOGIC TYPE: ductal adenocarcinoma HISTOLOGIC GRADE: grade 3 TUMOR EXTENSION: peripancreatic soft tissue MARGINS: negative for tumor TREATMENT EFFECT: treatment effects present in  the form of fibrosis (50%). LYMPHOVASCULAR INVASION: not identified PERINEURAL INVASION: no definite evidence REGIONAL LYMPH NODES:   NUMBER OF LYMPH NODES INVOLVED: 0   NUMBER OF LYMPH NODES EXAMINED: 12 PATHOLOGIC STAGE CLASSIFICATION (pTNM, AJCC 8th Ed): ypT2, ypN0 DISTANT METASTASIS (pM): pMx ADDITIONAL PATHOLOGIC FINDINGS: mucinous neoplasm, most consistent with intraductal papillary mucinous neoplasm (IPMN) with low-grade dysplasia, is identified adjacent to the main tumor.    11/24/2017 Cancer Staging    Staging form: Exocrine Pancreas, AJCC 8th Edition - Pathologic stage from 11/24/2017: Stage IB (pT2, pN0, cM0) - Signed by Truitt Merle, MD on 01/03/2018    11/25/2017 Surgery    She had surgery at [facility] 1. Exploratory Laparotomy 2. Distal Pancreatectomy and Splenectomy 3. Intraoperative Ultrasound 4. Open Cholecystectomy     12/15/2017 Cancer Staging    Staging form: Exocrine Pancreas, AJCC 8th Edition - Clinical: Stage IB (cT2, cN0, cM0) - Signed by Heath Lark, MD on 12/15/2017    12/28/2017 Imaging    12/28/2017 CT Abdomen  IMPRESSION: 1. Postoperative findings from recent partial pancreatectomy including a 21 cubic cm fluid collection along the pancreatic resection margin which could represent early pseudocyst. 2. Nodularity along the lateral limb of the left adrenal gland could also be postoperative but merit surveillance, as the pancreatic lesion was in close proximity to this adrenal gland on the prior CT of 11/07/2017. 3. Asymmetric fullness inferiorly in the left breast. The patient has a history of prior left breast procedures, correlation with mammography is recommended. 4. Other imaging findings of potential clinical significance: Stable hemangioma in the left hepatic lobe. Splenectomy. Aortic Atherosclerosis (ICD10-I70.0). Right hemicolectomy. Small focus of fat necrosis in the right anterior abdominal wall subcutaneous tissues near the laparotomy site.  Bilateral nonobstructive nephrolithiasis.    01/02/2018 Tumor Marker    Patient's tumor was tested for the following markers: CA-19-9 Results of the tumor marker test revealed 5    01/03/2018 -  Chemotherapy    She received modified dose FOLFIRINOX    04/10/2018 Imaging    1. Distal pancreatectomy, without findings of recurrent or metastatic disease. 2. Incompletely imaged hypoenhancement within lower lobe right pulmonary artery branch is likely chronic (but interval since 10/09/2017) pulmonary embolism. Dedicated CTA could further evaluate. 3. Decrease in size of a peripancreatic complex fluid collection anteriorly, likely a resolving pseudocyst. 4. Decreased size of minimal fluid versus a borderline sized node in the gastrohepatic ligament. Recommend attention on follow-up. 5. Hepatic steatosis with a segment 4 hemangioma. 6. Aortic Atherosclerosis (ICD10-I70.0). This is significantly age advanced.     REVIEW OF SYSTEMS:   Constitutional: Denies fevers, chills or abnormal weight loss Eyes: Denies blurriness of vision Ears, nose, mouth, throat, and face: Denies mucositis or sore throat Respiratory: Denies cough, dyspnea or wheezes Cardiovascular: Denies palpitation, chest discomfort or lower extremity swelling Skin: Denies abnormal skin rashes Lymphatics: Denies new lymphadenopathy or easy bruising Behavioral/Psych: Mood is stable, no new changes  All other systems were reviewed with the patient and are negative.  I have reviewed the past medical history, past surgical history, social history and family history with the patient and they are unchanged from previous note.  ALLERGIES:  is allergic to codeine; penicillins; and bactrim [sulfamethoxazole-trimethoprim].  MEDICATIONS:  Current Outpatient Medications  Medication Sig Dispense Refill  . acetaminophen (TYLENOL) 650 MG CR tablet Take 1,300 mg by mouth every 8 (eight) hours.    Marland Kitchen dexamethasone (DECADRON) 4 MG tablet Take 1  tablet (4 mg total) by mouth 2 (two) times daily with a meal. 60 tablet 1  . diclofenac sodium (VOLTAREN) 1 % GEL APPLY TO AFFECTED 3 LARGE JOINTS UP TO 3 TIMES DAILY AS NEEDED AS DIRECTED 300 g 1  . diphenoxylate-atropine (LOMOTIL) 2.5-0.025 MG tablet Take 1-2 tablets by mouth 4 (four) times daily as needed for diarrhea or loose stools. 1 to 2 po QID prn diarrhea 90 tablet 1  . gabapentin (NEURONTIN) 300 MG capsule TAKE 1 CAPSULE BY MOUTH THREE TIMES A DAY 270 capsule 11  . HYDROmorphone (DILAUDID) 4 MG tablet Take 1 tablet (4 mg total) by mouth every 4 (four) hours as needed for severe pain. 30 tablet 0  . hydrOXYzine (ATARAX/VISTARIL) 10 MG tablet Take 1 tablet (10 mg total) by mouth 3 (three) times daily as needed for itching. 60 tablet 1  . lidocaine-prilocaine (EMLA) cream Apply to affected area once (Patient taking differently: Apply 1 application topically daily as needed (port). Apply to affected area once) 30 g 3  . loratadine (CLARITIN) 10 MG tablet Take 10 mg by mouth daily after breakfast.    . LORazepam (ATIVAN) 0.5 MG tablet Place 1 tablet (0.5 mg total) under the tongue every 6 (six) hours as needed for anxiety. 60 tablet 1  . metFORMIN (GLUCOPHAGE) 500 MG tablet Take 2 tablets (1,000 mg total) by mouth 2 (two) times daily with a meal. 180 tablet 1  . naproxen sodium (ALEVE) 220 MG tablet Take 220 mg by mouth.    Marland Kitchen omeprazole (PRILOSEC) 20 MG capsule Take 20 mg by mouth 2 (two) times daily before a meal.     . ondansetron (ZOFRAN) 8 MG tablet Take 1 tablet (8 mg total) by mouth every 8 (eight) hours as needed for refractory nausea /  vomiting. Start on day 3 after chemo. 30 tablet 1  . predniSONE (DELTASONE) 5 MG tablet TAKE 1 TABLET BY MOUTH EVERY DAY WITH BREAKFAST 30 tablet 0  . prochlorperazine (COMPAZINE) 10 MG tablet Take 1 tablet (10 mg total) by mouth every 6 (six) hours as needed (Nausea or vomiting). 90 tablet 1  . rivaroxaban (XARELTO) 20 MG TABS tablet Take 1 tablet (20 mg  total) by mouth daily with supper. 30 tablet 9  . simethicone (MYLICON) 038 MG chewable tablet Chew 125 mg by mouth every 6 (six) hours as needed for flatulence.    . triamcinolone (NASACORT) 55 MCG/ACT AERO nasal inhaler Place 2 sprays into the nose daily. 1 Inhaler 12   No current facility-administered medications for this visit.    Facility-Administered Medications Ordered in Other Visits  Medication Dose Route Frequency Provider Last Rate Last Dose  . atropine injection 0.5 mg  0.5 mg Intravenous Once PRN Alvy Bimler, Rabab Currington, MD      . dextrose 5 % solution   Intravenous Once Alvy Bimler, Nickolaus Bordelon, MD      . fluorouracil (ADRUCIL) 3,950 mg in sodium chloride 0.9 % 71 mL chemo infusion  1,920 mg/m2 (Order-Specific) Intravenous 1 day or 1 dose Alvy Bimler, Hartlee Amedee, MD      . irinotecan (CAMPTOSAR) 200 mg in dextrose 5 % 500 mL chemo infusion  100 mg/m2 (Order-Specific) Intravenous Once Alvy Bimler, Rodney Wigger, MD      . leucovorin 828 mg in dextrose 5 % 250 mL infusion  400 mg/m2 (Order-Specific) Intravenous Once Alvy Bimler, Korbin Mapps, MD      . oxaliplatin (ELOXATIN) 90 mg in dextrose 5 % 500 mL chemo infusion  42.5 mg/m2 (Order-Specific) Intravenous Once Alvy Bimler, Kalden Wanke, MD 259 mL/hr at 04/25/18 1105 90 mg at 04/25/18 1105  . sodium chloride flush (NS) 0.9 % injection 10 mL  10 mL Intracatheter PRN Heath Lark, MD        PHYSICAL EXAMINATION: ECOG PERFORMANCE STATUS: 1 - Symptomatic but completely ambulatory  Vitals:   04/25/18 0830  BP: (!) 149/92  Pulse: 60  Resp: 18  Temp: 98.4 F (36.9 C)  SpO2: 100%   Filed Weights   04/25/18 0830  Weight: 221 lb 3.2 oz (100.3 kg)    GENERAL:alert, no distress and comfortable.  She looks mildly cushingoid SKIN: skin color, texture, turgor are normal, no rashes or significant lesions EYES: normal, Conjunctiva are pink and non-injected, sclera clear OROPHARYNX:no exudate, no erythema and lips, buccal mucosa, and tongue normal  NECK: supple, thyroid normal size, non-tender, without  nodularity LYMPH:  no palpable lymphadenopathy in the cervical, axillary or inguinal LUNGS: clear to auscultation and percussion with normal breathing effort HEART: regular rate & rhythm and no murmurs and no lower extremity edema ABDOMEN:abdomen soft, non-tender and normal bowel sounds Musculoskeletal:no cyanosis of digits and no clubbing  NEURO: alert & oriented x 3 with fluent speech, no focal motor/sensory deficits  LABORATORY DATA:  I have reviewed the data as listed    Component Value Date/Time   NA 142 04/25/2018 0806   K 3.8 04/25/2018 0806   CL 108 04/25/2018 0806   CO2 23 04/25/2018 0806   GLUCOSE 128 (H) 04/25/2018 0806   BUN 7 04/25/2018 0806   CREATININE 0.66 04/25/2018 0806   CREATININE 0.68 07/27/2017 0851   CALCIUM 8.8 (L) 04/25/2018 0806   PROT 6.3 (L) 04/25/2018 0806   ALBUMIN 3.0 (L) 04/25/2018 0806   AST 29 04/25/2018 0806   ALT 59 (H) 04/25/2018 0806   ALKPHOS  155 (H) 04/25/2018 0806   BILITOT <0.2 (L) 04/25/2018 0806   GFRNONAA >60 04/25/2018 0806   GFRNONAA 106 07/27/2017 0851   GFRAA >60 04/25/2018 0806   GFRAA 123 07/27/2017 0851    No results found for: SPEP, UPEP  Lab Results  Component Value Date   WBC 32.0 (H) 04/25/2018   NEUTROABS 22.4 (H) 04/25/2018   HGB 10.2 (L) 04/25/2018   HCT 33.8 (L) 04/25/2018   MCV 98.0 04/25/2018   PLT 341 04/25/2018      Chemistry      Component Value Date/Time   NA 142 04/25/2018 0806   K 3.8 04/25/2018 0806   CL 108 04/25/2018 0806   CO2 23 04/25/2018 0806   BUN 7 04/25/2018 0806   CREATININE 0.66 04/25/2018 0806   CREATININE 0.68 07/27/2017 0851      Component Value Date/Time   CALCIUM 8.8 (L) 04/25/2018 0806   ALKPHOS 155 (H) 04/25/2018 0806   AST 29 04/25/2018 0806   ALT 59 (H) 04/25/2018 0806   BILITOT <0.2 (L) 04/25/2018 0806       RADIOGRAPHIC STUDIES: I have personally reviewed the radiological images as listed and agreed with the findings in the report. Ct Abdomen Pelvis W  Contrast  Result Date: 04/10/2018 CLINICAL DATA:  Colon, uterine, ovarian, and pancreatic cancer. Ongoing chemotherapy. Splenectomy. Right colectomy. Hysterectomy. Distal pancreatectomy. Cholecystectomy. EXAM: CT ABDOMEN AND PELVIS WITH CONTRAST TECHNIQUE: Multidetector CT imaging of the abdomen and pelvis was performed using the standard protocol following bolus administration of intravenous contrast. CONTRAST:  137m OMNIPAQUE IOHEXOL 300 MG/ML  SOLN COMPARISON:  12/27/2017 FINDINGS: Lower chest: Clear lung bases. Normal heart size without pericardial or pleural effusion. Incompletely imaged central line. Hypoattenuation within the medial right lower lobe pulmonary artery branch, including on image 1/2, is new including back to 10/09/2017 chest CT. Hepatobiliary: Hepatic steatosis. Segment 4A hepatic hemangioma again identified. No suspicious liver lesion. Cholecystectomy, without biliary ductal dilatation. Pancreas: Distal pancreatectomy. Normal appearance of the remaining pancreatic head. The fluid collection ventral to the pancreatic head is decreased in size at 2.2 x 1.8 cm on image 53/7. Compare 3.9 x 2.8 cm on the prior. Spleen: Splenectomy. Adrenals/Urinary Tract: Normal adrenal glands. The soft tissue thickening along the lateral limb left adrenal gland has resolved. Interpolar 4 mm left renal collecting system calculus. Normal right kidney, without hydronephrosis. Normal urinary bladder. Stomach/Bowel: Normal stomach, without wall thickening. Colonic stool burden suggests constipation. Partial right hemicolectomy. Normal small bowel. Vascular/Lymphatic: Aortic atherosclerosis. Patent portal and hepatic veins. Decrease in fluid versus a lymph node in the gastrohepatic ligament. 9 mm short axis today on image 43/7 versus 11 mm on the prior exam. No enlarging abdominopelvic nodes. Reproductive: Hysterectomy.  No adnexal mass. Other: No significant free fluid. Mild pelvic floor laxity. No evidence of  omental or peritoneal disease. Fat containing right paramidline ventral abdominal wall hernias x2, small. Musculoskeletal: No acute osseous abnormality. IMPRESSION: 1. Distal pancreatectomy, without findings of recurrent or metastatic disease. 2. Incompletely imaged hypoenhancement within lower lobe right pulmonary artery branch is likely chronic (but interval since 10/09/2017) pulmonary embolism. Dedicated CTA could further evaluate. 3. Decrease in size of a peripancreatic complex fluid collection anteriorly, likely a resolving pseudocyst. 4. Decreased size of minimal fluid versus a borderline sized node in the gastrohepatic ligament. Recommend attention on follow-up. 5. Hepatic steatosis with a segment 4 hemangioma. 6. Aortic Atherosclerosis (ICD10-I70.0). This is significantly age advanced. These results will be called to the ordering clinician or representative by  the Radiologist Assistant, and communication documented in the PACS or zVision Dashboard. Electronically Signed   By: Abigail Miyamoto M.D.   On: 04/10/2018 14:16    All questions were answered. The patient knows to call the clinic with any problems, questions or concerns. No barriers to learning was detected.  I spent 30 minutes counseling the patient face to face. The total time spent in the appointment was 40 minutes and more than 50% was on counseling and review of test results  Heath Lark, MD 04/25/2018 1:02 PM

## 2018-04-25 NOTE — Assessment & Plan Note (Signed)
I plan to continue on reduced dose oxaliplatin since she tolerated that better She will also continue taking gabapentin and pain medicine as needed

## 2018-04-25 NOTE — Assessment & Plan Note (Signed)
She has mildly elevated liver enzymes, likely due to recent chemotherapy.  Observe only for now I am planning to continue on reduced dose of chemotherapy as above.

## 2018-04-25 NOTE — Progress Notes (Signed)
Pt stated she experienced a fever of 99 for 4 days after receiving the neulasta injection during the previous cycle.  Dr. Alvy Bimler notified, requested for patient to take Claritin 10 mg PO daily.  Information relayed to pt, and pt verbalized understanding.

## 2018-04-25 NOTE — Assessment & Plan Note (Signed)
She has incidental finding of pulmonary embolism.  We discussed the risk, benefits, side effects of anticoagulation therapy. The plan will be to continue on treatment for minimum 6 months to a year. For that reason, I do not recommend her to interrupt anticoagulation therapy for elective procedures if possible 

## 2018-04-27 ENCOUNTER — Inpatient Hospital Stay: Payer: BLUE CROSS/BLUE SHIELD

## 2018-04-27 VITALS — BP 128/89 | HR 98 | Resp 19

## 2018-04-27 DIAGNOSIS — R112 Nausea with vomiting, unspecified: Secondary | ICD-10-CM | POA: Diagnosis not present

## 2018-04-27 DIAGNOSIS — G620 Drug-induced polyneuropathy: Secondary | ICD-10-CM | POA: Diagnosis not present

## 2018-04-27 DIAGNOSIS — Z79899 Other long term (current) drug therapy: Secondary | ICD-10-CM | POA: Diagnosis not present

## 2018-04-27 DIAGNOSIS — Z7901 Long term (current) use of anticoagulants: Secondary | ICD-10-CM | POA: Diagnosis not present

## 2018-04-27 DIAGNOSIS — Z1509 Genetic susceptibility to other malignant neoplasm: Secondary | ICD-10-CM | POA: Diagnosis not present

## 2018-04-27 DIAGNOSIS — C252 Malignant neoplasm of tail of pancreas: Secondary | ICD-10-CM

## 2018-04-27 DIAGNOSIS — C7961 Secondary malignant neoplasm of right ovary: Secondary | ICD-10-CM | POA: Diagnosis not present

## 2018-04-27 DIAGNOSIS — K521 Toxic gastroenteritis and colitis: Secondary | ICD-10-CM | POA: Diagnosis not present

## 2018-04-27 DIAGNOSIS — Z5111 Encounter for antineoplastic chemotherapy: Secondary | ICD-10-CM | POA: Diagnosis not present

## 2018-04-27 DIAGNOSIS — C541 Malignant neoplasm of endometrium: Secondary | ICD-10-CM | POA: Diagnosis not present

## 2018-04-27 DIAGNOSIS — E099 Drug or chemical induced diabetes mellitus without complications: Secondary | ICD-10-CM | POA: Diagnosis not present

## 2018-04-27 DIAGNOSIS — I2699 Other pulmonary embolism without acute cor pulmonale: Secondary | ICD-10-CM | POA: Diagnosis not present

## 2018-04-27 MED ORDER — SODIUM CHLORIDE 0.9% FLUSH
10.0000 mL | Freq: Once | INTRAVENOUS | Status: AC
  Administered 2018-04-27: 10 mL
  Filled 2018-04-27: qty 10

## 2018-04-27 MED ORDER — PEGFILGRASTIM INJECTION 6 MG/0.6ML ~~LOC~~
PREFILLED_SYRINGE | SUBCUTANEOUS | Status: AC
  Filled 2018-04-27: qty 0.6

## 2018-04-27 MED ORDER — HEPARIN SOD (PORK) LOCK FLUSH 100 UNIT/ML IV SOLN
500.0000 [IU] | Freq: Once | INTRAVENOUS | Status: AC
  Administered 2018-04-27: 500 [IU]
  Filled 2018-04-27: qty 5

## 2018-04-27 MED ORDER — PEGFILGRASTIM INJECTION 6 MG/0.6ML ~~LOC~~
6.0000 mg | PREFILLED_SYRINGE | Freq: Once | SUBCUTANEOUS | Status: AC
  Administered 2018-04-27: 6 mg via SUBCUTANEOUS

## 2018-04-27 NOTE — Patient Instructions (Signed)
Pegfilgrastim injection  What is this medicine?  PEGFILGRASTIM (PEG fil gra stim) is a long-acting granulocyte colony-stimulating factor that stimulates the growth of neutrophils, a type of white blood cell important in the body's fight against infection. It is used to reduce the incidence of fever and infection in patients with certain types of cancer who are receiving chemotherapy that affects the bone marrow, and to increase survival after being exposed to high doses of radiation.  This medicine may be used for other purposes; ask your health care provider or pharmacist if you have questions.  COMMON BRAND NAME(S): Fulphila, Neulasta, UDENYCA  What should I tell my health care provider before I take this medicine?  They need to know if you have any of these conditions:  -kidney disease  -latex allergy  -ongoing radiation therapy  -sickle cell disease  -skin reactions to acrylic adhesives (On-Body Injector only)  -an unusual or allergic reaction to pegfilgrastim, filgrastim, other medicines, foods, dyes, or preservatives  -pregnant or trying to get pregnant  -breast-feeding  How should I use this medicine?  This medicine is for injection under the skin. If you get this medicine at home, you will be taught how to prepare and give the pre-filled syringe or how to use the On-body Injector. Refer to the patient Instructions for Use for detailed instructions. Use exactly as directed. Tell your healthcare provider immediately if you suspect that the On-body Injector may not have performed as intended or if you suspect the use of the On-body Injector resulted in a missed or partial dose.  It is important that you put your used needles and syringes in a special sharps container. Do not put them in a trash can. If you do not have a sharps container, call your pharmacist or healthcare provider to get one.  Talk to your pediatrician regarding the use of this medicine in children. While this drug may be prescribed for  selected conditions, precautions do apply.  Overdosage: If you think you have taken too much of this medicine contact a poison control center or emergency room at once.  NOTE: This medicine is only for you. Do not share this medicine with others.  What if I miss a dose?  It is important not to miss your dose. Call your doctor or health care professional if you miss your dose. If you miss a dose due to an On-body Injector failure or leakage, a new dose should be administered as soon as possible using a single prefilled syringe for manual use.  What may interact with this medicine?  Interactions have not been studied.  Give your health care provider a list of all the medicines, herbs, non-prescription drugs, or dietary supplements you use. Also tell them if you smoke, drink alcohol, or use illegal drugs. Some items may interact with your medicine.  This list may not describe all possible interactions. Give your health care provider a list of all the medicines, herbs, non-prescription drugs, or dietary supplements you use. Also tell them if you smoke, drink alcohol, or use illegal drugs. Some items may interact with your medicine.  What should I watch for while using this medicine?  You may need blood work done while you are taking this medicine.  If you are going to need a MRI, CT scan, or other procedure, tell your doctor that you are using this medicine (On-Body Injector only).  What side effects may I notice from receiving this medicine?  Side effects that you should report to   your doctor or health care professional as soon as possible:  -allergic reactions like skin rash, itching or hives, swelling of the face, lips, or tongue  -back pain  -dizziness  -fever  -pain, redness, or irritation at site where injected  -pinpoint red spots on the skin  -red or dark-brown urine  -shortness of breath or breathing problems  -stomach or side pain, or pain at the shoulder  -swelling  -tiredness  -trouble passing urine or  change in the amount of urine  Side effects that usually do not require medical attention (report to your doctor or health care professional if they continue or are bothersome):  -bone pain  -muscle pain  This list may not describe all possible side effects. Call your doctor for medical advice about side effects. You may report side effects to FDA at 1-800-FDA-1088.  Where should I keep my medicine?  Keep out of the reach of children.  If you are using this medicine at home, you will be instructed on how to store it. Throw away any unused medicine after the expiration date on the label.  NOTE: This sheet is a summary. It may not cover all possible information. If you have questions about this medicine, talk to your doctor, pharmacist, or health care provider.   2019 Elsevier/Gold Standard (2017-05-16 16:57:08)

## 2018-04-28 DIAGNOSIS — J343 Hypertrophy of nasal turbinates: Secondary | ICD-10-CM | POA: Diagnosis not present

## 2018-04-28 DIAGNOSIS — E860 Dehydration: Secondary | ICD-10-CM | POA: Diagnosis not present

## 2018-04-28 DIAGNOSIS — J310 Chronic rhinitis: Secondary | ICD-10-CM | POA: Diagnosis not present

## 2018-05-01 DIAGNOSIS — R110 Nausea: Secondary | ICD-10-CM | POA: Diagnosis not present

## 2018-05-01 DIAGNOSIS — Z79899 Other long term (current) drug therapy: Secondary | ICD-10-CM | POA: Diagnosis not present

## 2018-05-01 DIAGNOSIS — M170 Bilateral primary osteoarthritis of knee: Secondary | ICD-10-CM | POA: Diagnosis not present

## 2018-05-01 DIAGNOSIS — Z452 Encounter for adjustment and management of vascular access device: Secondary | ICD-10-CM | POA: Diagnosis not present

## 2018-05-01 DIAGNOSIS — Z7952 Long term (current) use of systemic steroids: Secondary | ICD-10-CM | POA: Diagnosis not present

## 2018-05-01 DIAGNOSIS — Z5181 Encounter for therapeutic drug level monitoring: Secondary | ICD-10-CM | POA: Diagnosis not present

## 2018-05-01 DIAGNOSIS — C252 Malignant neoplasm of tail of pancreas: Secondary | ICD-10-CM | POA: Diagnosis not present

## 2018-05-01 DIAGNOSIS — Z8543 Personal history of malignant neoplasm of ovary: Secondary | ICD-10-CM | POA: Diagnosis not present

## 2018-05-01 DIAGNOSIS — G620 Drug-induced polyneuropathy: Secondary | ICD-10-CM | POA: Diagnosis not present

## 2018-05-01 DIAGNOSIS — Z9181 History of falling: Secondary | ICD-10-CM | POA: Diagnosis not present

## 2018-05-01 DIAGNOSIS — T451X5D Adverse effect of antineoplastic and immunosuppressive drugs, subsequent encounter: Secondary | ICD-10-CM | POA: Diagnosis not present

## 2018-05-01 NOTE — Progress Notes (Addendum)
Office Visit Note  Patient: Dana Hicks             Date of Birth: 1975/09/24           MRN: [id number]             PCP: [name], MD Referring: [name], MD Visit Date: 05/15/2018 Occupation: _0 @  Subjective:  Pain in both knees   History of Present Illness: Dana Hicks is a 43 y.o. female with history of seronegative rheumatoid arthritis and osteoarthritis.  She is on Prednisone 5 mg po daily.  She reports she has 1 more round of chemo and then will have a month off before switching to a PARP inhibitor.  She states she has developed neuropathy in both feet so her treatments were spaced out.  She reports her bilateral knee joint pain is dependent on her chemo treatments.  She states both knees are causing discomfort at this time.  She states the left knee joint is swelling.  She is using voltaren gel topically BID. She is having right elbow joint stiffness and discomfort.  She reports in the past she has had cortisone injections to help with contractures.  She has noticed increased neck stiffness.  She denies any other joint pain or joint swelling.   Activities of Daily Living:  Patient reports morning stiffness for  several hours.   Patient Reports nocturnal pain.  Difficulty dressing/grooming: Denies Difficulty climbing stairs: Reports Difficulty getting out of chair: Reports Difficulty using hands for taps, buttons, cutlery, and/or writing: Denies  Review of Systems  Constitutional: Positive for fatigue.  HENT: Negative for mouth sores, mouth dryness and nose dryness.   Eyes: Positive for dryness. Negative for pain and visual disturbance.  Respiratory: Negative for cough, hemoptysis, shortness of breath and difficulty breathing.   Cardiovascular: Positive for hypertension. Negative for chest pain, palpitations and swelling in legs/feet.  Gastrointestinal: Positive for diarrhea (SE of chemo). Negative for blood in stool and constipation.  Endocrine:  Negative for increased urination.  Genitourinary: Negative for painful urination.  Musculoskeletal: Positive for arthralgias, joint pain, joint swelling and morning stiffness. Negative for myalgias, muscle weakness, muscle tenderness and myalgias.  Skin: Negative for color change, pallor, rash, hair loss, nodules/bumps, skin tightness, ulcers and sensitivity to sunlight.  Neurological: Negative for dizziness, numbness, headaches and weakness.  Hematological: Negative for swollen glands.  Psychiatric/Behavioral: Negative for depressed mood and sleep disturbance. The patient is not nervous/anxious.     PMFS History:  Patient Active Problem List   Diagnosis Date Noted  . Pulmonary embolism ([place]) 04/10/2018  . Diarrhea due to drug 03/28/2018  . 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 Neutropenic fever ([place]) 02/14/2018  . Acute rhinitis 02/14/2018  . Tachycardia 02/14/2018  . Elevated liver enzymes 02/14/2018  . Physical debility 02/07/2018  . Chemotherapy-induced nausea 02/07/2018  . Hot flashes due to surgical menopause 02/06/2018  . Anemia due to antineoplastic chemotherapy 12/15/2017  . Hypomagnesemia 12/15/2017  . Iron deficiency anemia 12/15/2017  . S/P splenectomy 12/15/2017  . Lynch syndrome 11/22/2017  . Anterior leg pain, right 11/21/2017  . Genetic testing 11/18/2017  . Inflammatory arthritis 11/08/2017  . Dysuria 11/02/2017  . Peripheral neuropathy due to chemotherapy ([place]) 10/22/2017  . Bone pain 10/21/2017  . Family history of colon cancer   . Pancreatic cancer ([place]) 10/17/2017  . Family history of uterine cancer 10/17/2017  . Sinusitis, chronic 09/15/2017  . Endometrial cancer ([place]) 08/31/2017  . Secondary malignant neoplasm of right  ovary ([place]) 08/31/2017  . Pustular folliculitis 56/70/1410  . MRSA colonization 05/12/2017  . Left foot pain 01/10/2017  . Osteoarthritis of both knees 12/01/2016  . Class 3 severe obesity due to excess calories without serious  comorbidity with body mass index (BMI) of 40.0 to 44.9 in adult ([place]) 10/01/2016  . High risk medication use 02/06/2016  . Family history of rheumatoid arthritis 02/06/2016  . H/O seasonal allergies 02/06/2016  . Rheumatoid arthritis of multiple sites with negative rheumatoid factor ([place]) 02/03/2016  . History of colon cancer 02/03/2016  . HLA B27 (HLA B27 positive) 02/03/2016    Past Medical History:  Diagnosis Date  . Allergic rhinitis, seasonal   . 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 Endometrial cancer ([place]) 08/2017   Stage IA, Grade 1  . History of MRSA infection 01/2017   followed by infectious disease center--  recurrent pustular folliculitis  . Nephrolithiasis    bilateral nonobstructive calculi per CT 08-18-2017  . OA (osteoarthritis)   . Ovarian cancer, right ([place]) 08/2017   Stage II Grade 2 Endometrioid  . Pancreatic cancer ([place])   . Rheumatoid arthritis [facility])    rheumatologist-  dr devenswar-- treated w/ oral prednisone daily and methotrexate injection every 3 wks    Family History  Problem Relation Age of Onset  . Arthritis/Rheumatoid Mother   . Uterine cancer Mother 63  . Allergic rhinitis Father   . Heart disease Father   . Drug abuse Son   . Other Maternal Aunt        Unknown GYN CA  . Colon cancer Maternal Uncle 33  . Diabetes Maternal Grandmother   . Cancer Maternal Grandfather        d. 28s of liver/lung cancer  . Angioedema Neg Hx   . Asthma Neg Hx   . Atopy Neg Hx   . Eczema Neg Hx   . Immunodeficiency Neg Hx   . Urticaria Neg Hx    Past Surgical History:  Procedure Laterality Date  . BREAST LUMPECTOMY WITH RADIOACTIVE SEED LOCALIZATION Left 06/28/2014   Benign Procedure: RADIOACTIVE SEED LOCALIZATION LEFT BREAST LUMPECTOMY;  Surgeon: Excell Seltzer, MD;  Location: [facility];  Service: General;  Laterality: Left;  . COLONOSCOPY  06/19/14  . EYE SURGERY Left     plug in tear duct  . IR IMAGING GUIDED PORT INSERTION  09/26/2017  . KNEE ARTHROSCOPY    . PANCREATECTOMY  11/25/2017  . PORT A CATH REVISION  2001   in and out  . RIGHT COLECTOMY  06/02/2009   cecum cancer  . ROBOTIC ASSISTED TOTAL HYSTERECTOMY WITH BILATERAL SALPINGO OOPHERECTOMY Bilateral 08/30/2017   Procedure: XI ROBOTIC ASSISTED TOTAL  HYSTERECTOMY BILATERAL  SALPINGO OOPHORECTOMY; LYSIS OF ADHESIONS;  Surgeon: Isabel Caprice, MD;  Location: [facility];  Service: Gynecology;  Laterality: Bilateral;  . SPLENECTOMY, PARTIAL  11/25/2017   Social History   Social History Narrative  . Not on file   Immunization History  Administered Date(s) Administered  . HiB (PRP-T) 12/04/2017  . Influenza,inj,Quad PF,6+ Mos 02/18/2017, 12/04/2017  . Meningococcal B Recombinant 12/04/2017  . Meningococcal Conjugate 12/04/2017  . Pneumococcal Conjugate-13 12/04/2017     Objective: Vital Signs: BP (!) 138/92 (BP Location: Left Arm, Patient Position: Sitting, Cuff Size: Normal)   Pulse (!) 107   Resp 14   Ht _0  (1.575 m)   Wt 222 lb 12.8 oz (101.1 kg)  LMP 08/30/2017   BMI 40.75 kg/m    Physical Exam Vitals signs and nursing note reviewed.  Constitutional:      Appearance: She is well-developed.  HENT:     Head: Normocephalic and atraumatic.  Eyes:     Conjunctiva/sclera: Conjunctivae normal.  Neck:     Musculoskeletal: Normal range of motion.  Cardiovascular:     Rate and Rhythm: Normal rate and regular rhythm.     Heart sounds: Normal heart sounds.  Pulmonary:     Effort: Pulmonary effort is normal.     Breath sounds: Normal breath sounds.  Abdominal:     General: Bowel sounds are normal.     Palpations: Abdomen is soft.  Lymphadenopathy:     Cervical: No cervical adenopathy.  Skin:    General: Skin is warm and dry.     Capillary Refill: Capillary refill takes less than 2 seconds.  Neurological:     Mental Status: She is alert and oriented to person, place, and time.    Psychiatric:        Behavior: Behavior normal.      Musculoskeletal Exam: C-spine limited lateral rotation.  Thoracic spine and lumbar spine good ROM.  No midline spinal tenderness.  No SI joint tenderness.  Shoulder joints, elbow joints, wrist joints, MCPs, PIPs, and DIPs good ROM with no synovitis.  Complete fist formation bilaterally.  Hip joints, knee joints, ankle joints, MTPs, PIPs ,and DIPs good ROM with no synovitis.  No warmth or effusion of knee joints.  No tenderness or swelling of ankle joints.  No tenderness over trochanteric bursa bilaterally.   CDAI Exam: CDAI Score: 1.4  Patient Global Assessment: 7 (mm); Provider Global Assessment: 7 (mm) Swollen: 0 ; Tender: 0  Joint Exam   Not documented   There is currently no information documented on the homunculus. Go to the Rheumatology activity and complete the homunculus joint exam.  Investigation: No additional findings.  Imaging: No results found.  Recent Labs: Lab Results  Component Value Date   WBC 19.6 (H) 05/09/2018   HGB 10.4 (L) 05/09/2018   PLT 461 (H) 05/09/2018   NA 141 05/09/2018   K 4.1 05/09/2018   CL 105 05/09/2018   CO2 21 (L) 05/09/2018   GLUCOSE 154 (H) 05/09/2018   BUN 10 05/09/2018   CREATININE 0.63 05/09/2018   BILITOT <0.2 (L) 05/09/2018   ALKPHOS 157 (H) 05/09/2018   AST 36 05/09/2018   ALT 73 (H) 05/09/2018   PROT 6.7 05/09/2018   ALBUMIN 3.1 (L) 05/09/2018   CALCIUM 9.1 05/09/2018   GFRAA >60 05/09/2018    Speciality Comments: No specialty comments available.  Procedures:  Large Joint Inj: bilateral knee on 05/15/2018 9:33 AM Indications: pain Details: 27 G 1.5 in needle, medial approach  Arthrogram: No  Medications (Right): 1.5 mL lidocaine 1 %; 40 mg triamcinolone acetonide 40 MG/ML Aspirate (Right): 0 mL Medications (Left): 1.5 mL lidocaine 1 %; 40 mg triamcinolone acetonide 40 MG/ML Aspirate (Left): 0 mL Outcome: tolerated well, no immediate complications Procedure,  treatment alternatives, risks and benefits explained, specific risks discussed. Consent was given by the patient. Immediately prior to procedure a time out was called to verify the correct patient, procedure, equipment, support staff and site/side marked as required. Patient was prepped and draped in the usual sterile fashion.     Allergies: Codeine; Penicillins; and Bactrim [sulfamethoxazole-trimethoprim]   Assessment / Plan:     Visit Diagnoses: Rheumatoid arthritis of multiple sites with negative rheumatoid  factor ([place]): She has warmth and tenderness of both knee joints.  She has right elbow joint tenderness but no contracture or synovitis noted.  No other joint pain or synovitis noted.  She is currently on Prednisone 5 mg po daily and is using voltaren gel topically BID PRN for pain relief. She requested bilateral knee joint injections. She tolerated the procedure well.  Procedure note competed above.  She was advised to monitor her BP following the cortisone injection. She is currently undergoing chemo therapy and has 1 more round scheduled.  She will have 1 month off and then will be started on PARP inhibitor therapy.  A refill of prednisone was sent to the pharmacy. She was advised to notify if she develops increased joint pain or joint swelling.  She will follow up in 5 months.   HLA B27 (HLA B27 positive)  High risk medication use - She is taking prednisone 5 mg po daily.   Primary osteoarthritis of both knees: Chronic.  She has warmth of both knee joints.  She has good ROM on exam.  She has been using voltaren gel topically BID PRN for pain relief.  She requested bilateral knee joint cortisone injections.  She tolerated the procedure well.  Procedure note completed above.   Other medical conditions are listed as follows:   Family history of rheumatoid arthritis  Status post splenectomy  Secondary malignant neoplasm of right ovary ([place]) - Dr. Alvy Bimler  Pancreatic neoplasm - surgical  partial resection on 11/25/17.  She also had a splenectomy  History of colon cancer - Right hemicolectomy in 2001.    Orders: Orders Placed This Encounter  Procedures  . Large Joint Inj   Meds ordered this encounter  Medications  . predniSONE (DELTASONE) 5 MG tablet    Sig: TAKE 1 TABLET BY MOUTH EVERY DAY WITH BREAKFAST    Dispense:  30 tablet    Refill:  0    Face-to-face time spent with patient was 30 minutes. Greater than 50% of time was spent in counseling and coordination of care.  Follow-Up Instructions: Return in about 5 months (around 10/15/2018) for Rheumatoid arthritis, Osteoarthritis.   Ofilia Neas, PA-C  Note - This record has been created using Dragon software.  Chart creation errors have been sought, but may not always  have been located. Such creation errors do not reflect on  the standard of medical care.

## 2018-05-02 DIAGNOSIS — J343 Hypertrophy of nasal turbinates: Secondary | ICD-10-CM | POA: Diagnosis not present

## 2018-05-02 DIAGNOSIS — J310 Chronic rhinitis: Secondary | ICD-10-CM | POA: Diagnosis not present

## 2018-05-04 DIAGNOSIS — C252 Malignant neoplasm of tail of pancreas: Secondary | ICD-10-CM | POA: Diagnosis not present

## 2018-05-07 ENCOUNTER — Other Ambulatory Visit: Payer: Self-pay | Admitting: Hematology and Oncology

## 2018-05-09 ENCOUNTER — Other Ambulatory Visit: Payer: Self-pay

## 2018-05-09 ENCOUNTER — Inpatient Hospital Stay: Payer: BLUE CROSS/BLUE SHIELD

## 2018-05-09 ENCOUNTER — Inpatient Hospital Stay: Payer: BLUE CROSS/BLUE SHIELD | Admitting: Hematology and Oncology

## 2018-05-09 ENCOUNTER — Encounter: Payer: Self-pay | Admitting: Hematology and Oncology

## 2018-05-09 DIAGNOSIS — Z1509 Genetic susceptibility to other malignant neoplasm: Secondary | ICD-10-CM | POA: Diagnosis not present

## 2018-05-09 DIAGNOSIS — C7961 Secondary malignant neoplasm of right ovary: Secondary | ICD-10-CM | POA: Diagnosis not present

## 2018-05-09 DIAGNOSIS — C252 Malignant neoplasm of tail of pancreas: Secondary | ICD-10-CM | POA: Diagnosis not present

## 2018-05-09 DIAGNOSIS — C541 Malignant neoplasm of endometrium: Secondary | ICD-10-CM

## 2018-05-09 DIAGNOSIS — G620 Drug-induced polyneuropathy: Secondary | ICD-10-CM | POA: Diagnosis not present

## 2018-05-09 DIAGNOSIS — I2699 Other pulmonary embolism without acute cor pulmonale: Secondary | ICD-10-CM

## 2018-05-09 DIAGNOSIS — Z5111 Encounter for antineoplastic chemotherapy: Secondary | ICD-10-CM | POA: Diagnosis not present

## 2018-05-09 DIAGNOSIS — K521 Toxic gastroenteritis and colitis: Secondary | ICD-10-CM | POA: Diagnosis not present

## 2018-05-09 DIAGNOSIS — Z79899 Other long term (current) drug therapy: Secondary | ICD-10-CM | POA: Diagnosis not present

## 2018-05-09 DIAGNOSIS — T451X5A Adverse effect of antineoplastic and immunosuppressive drugs, initial encounter: Secondary | ICD-10-CM

## 2018-05-09 DIAGNOSIS — Z7901 Long term (current) use of anticoagulants: Secondary | ICD-10-CM | POA: Diagnosis not present

## 2018-05-09 DIAGNOSIS — R112 Nausea with vomiting, unspecified: Secondary | ICD-10-CM | POA: Diagnosis not present

## 2018-05-09 DIAGNOSIS — E099 Drug or chemical induced diabetes mellitus without complications: Secondary | ICD-10-CM | POA: Diagnosis not present

## 2018-05-09 LAB — CBC WITH DIFFERENTIAL (CANCER CENTER ONLY)
Abs Immature Granulocytes: 0.84 10*3/uL — ABNORMAL HIGH (ref 0.00–0.07)
Basophils Absolute: 0.1 10*3/uL (ref 0.0–0.1)
Basophils Relative: 0 %
Eosinophils Absolute: 0 10*3/uL (ref 0.0–0.5)
Eosinophils Relative: 0 %
HCT: 33.9 % — ABNORMAL LOW (ref 36.0–46.0)
Hemoglobin: 10.4 g/dL — ABNORMAL LOW (ref 12.0–15.0)
Immature Granulocytes: 4 %
Lymphocytes Relative: 19 %
Lymphs Abs: 3.7 10*3/uL (ref 0.7–4.0)
MCH: 30.7 pg (ref 26.0–34.0)
MCHC: 30.7 g/dL (ref 30.0–36.0)
MCV: 100 fL (ref 80.0–100.0)
Monocytes Absolute: 2.2 10*3/uL — ABNORMAL HIGH (ref 0.1–1.0)
Monocytes Relative: 11 %
Neutro Abs: 12.9 10*3/uL — ABNORMAL HIGH (ref 1.7–7.7)
Neutrophils Relative %: 66 %
Platelet Count: 461 10*3/uL — ABNORMAL HIGH (ref 150–400)
RBC: 3.39 MIL/uL — ABNORMAL LOW (ref 3.87–5.11)
RDW: 20.7 % — ABNORMAL HIGH (ref 11.5–15.5)
WBC Count: 19.6 10*3/uL — ABNORMAL HIGH (ref 4.0–10.5)
nRBC: 0.2 % (ref 0.0–0.2)

## 2018-05-09 LAB — CMP (CANCER CENTER ONLY)
ALT: 73 U/L — ABNORMAL HIGH (ref 0–44)
AST: 36 U/L (ref 15–41)
Albumin: 3.1 g/dL — ABNORMAL LOW (ref 3.5–5.0)
Alkaline Phosphatase: 157 U/L — ABNORMAL HIGH (ref 38–126)
Anion gap: 15 (ref 5–15)
BUN: 10 mg/dL (ref 6–20)
CO2: 21 mmol/L — ABNORMAL LOW (ref 22–32)
Calcium: 9.1 mg/dL (ref 8.9–10.3)
Chloride: 105 mmol/L (ref 98–111)
Creatinine: 0.63 mg/dL (ref 0.44–1.00)
GFR, Est AFR Am: >60 mL/min (ref >60)
GFR, Estimated: >60 mL/min (ref >60)
Glucose, Bld: 154 mg/dL — ABNORMAL HIGH (ref 70–99)
Potassium: 4.1 mmol/L (ref 3.5–5.1)
Sodium: 141 mmol/L (ref 135–145)
Total Bilirubin: <0.2 mg/dL — ABNORMAL LOW (ref 0.3–1.2)
Total Protein: 6.7 g/dL (ref 6.5–8.1)

## 2018-05-09 LAB — MAGNESIUM: Magnesium: 2 mg/dL (ref 1.7–2.4)

## 2018-05-09 MED ORDER — SODIUM CHLORIDE 0.9% FLUSH
10.0000 mL | Freq: Once | INTRAVENOUS | Status: AC
  Administered 2018-05-09: 10 mL
  Filled 2018-05-09: qty 10

## 2018-05-09 MED ORDER — PALONOSETRON HCL INJECTION 0.25 MG/5ML
INTRAVENOUS | Status: AC
  Filled 2018-05-09: qty 5

## 2018-05-09 MED ORDER — SODIUM CHLORIDE 0.9 % IV SOLN
Freq: Once | INTRAVENOUS | Status: AC
  Administered 2018-05-09: 10:00:00 via INTRAVENOUS
  Filled 2018-05-09: qty 5

## 2018-05-09 MED ORDER — SODIUM CHLORIDE 0.9 % IV SOLN: 20.0000 mg | Freq: Once | INTRAVENOUS | Status: DC

## 2018-05-09 MED ORDER — LEUCOVORIN CALCIUM INJECTION 350 MG
400.0000 mg/m2 | Freq: Once | INTRAVENOUS | Status: AC
  Administered 2018-05-09: 828 mg via INTRAVENOUS
  Filled 2018-05-09: qty 41.4

## 2018-05-09 MED ORDER — IRINOTECAN HCL CHEMO INJECTION 100 MG/5ML
100.0000 mg/m2 | Freq: Once | INTRAVENOUS | Status: AC
  Administered 2018-05-09: 200 mg via INTRAVENOUS
  Filled 2018-05-09: qty 10

## 2018-05-09 MED ORDER — ATROPINE SULFATE 1 MG/ML IJ SOLN
INTRAMUSCULAR | Status: AC
  Filled 2018-05-09: qty 1

## 2018-05-09 MED ORDER — PALONOSETRON HCL INJECTION 0.25 MG/5ML
0.2500 mg | Freq: Once | INTRAVENOUS | Status: AC
  Administered 2018-05-09: 0.25 mg via INTRAVENOUS

## 2018-05-09 MED ORDER — DEXTROSE 5 % IV SOLN
Freq: Once | INTRAVENOUS | Status: AC
  Administered 2018-05-09: 13:00:00 via INTRAVENOUS
  Filled 2018-05-09: qty 250

## 2018-05-09 MED ORDER — DIPHENHYDRAMINE HCL 50 MG/ML IJ SOLN
INTRAMUSCULAR | Status: AC
  Filled 2018-05-09: qty 1

## 2018-05-09 MED ORDER — OXALIPLATIN CHEMO INJECTION 100 MG/20ML
34.0000 mg/m2 | Freq: Once | INTRAVENOUS | Status: AC
  Administered 2018-05-09: 70 mg via INTRAVENOUS
  Filled 2018-05-09: qty 14

## 2018-05-09 MED ORDER — DIPHENHYDRAMINE HCL 50 MG/ML IJ SOLN
50.0000 mg | Freq: Once | INTRAMUSCULAR | Status: AC
  Administered 2018-05-09: 50 mg via INTRAVENOUS

## 2018-05-09 MED ORDER — SODIUM CHLORIDE 0.9 % IV SOLN
20.0000 mg | Freq: Once | INTRAVENOUS | Status: AC
  Administered 2018-05-09: 20 mg via INTRAVENOUS
  Filled 2018-05-09: qty 2

## 2018-05-09 MED ORDER — SODIUM CHLORIDE 0.9 % IV SOLN
1920.0000 mg/m2 | INTRAVENOUS | Status: DC
  Administered 2018-05-09: 3950 mg via INTRAVENOUS
  Filled 2018-05-09: qty 79

## 2018-05-09 MED ORDER — ATROPINE SULFATE 1 MG/ML IJ SOLN
0.5000 mg | Freq: Once | INTRAMUSCULAR | Status: AC | PRN
  Administered 2018-05-09: 0.5 mg via INTRAVENOUS

## 2018-05-09 MED ORDER — DEXTROSE 5 % IV SOLN
Freq: Once | INTRAVENOUS | Status: AC
  Administered 2018-05-09: 09:00:00 via INTRAVENOUS
  Filled 2018-05-09: qty 250

## 2018-05-09 MED ORDER — SODIUM CHLORIDE 0.9% FLUSH
10.0000 mL | INTRAVENOUS | Status: DC | PRN
  Filled 2018-05-09: qty 10

## 2018-05-09 NOTE — Patient Instructions (Signed)
[facility] Discharge Instructions for Patients Receiving Chemotherapy  Today you received the following chemotherapy agents: Oxaliplatin (Eloxatin), Irinotecan (Camptosar), Leucovorin, Fluorouracil (Adrucil, 5-FU)  To help prevent nausea and vomiting after your treatment, we encourage you to take your nausea medication as directed.    If you develop nausea and vomiting that is not controlled by your nausea medication, call the clinic.   BELOW ARE SYMPTOMS THAT SHOULD BE REPORTED IMMEDIATELY:  *FEVER GREATER THAN 100.5 F  *CHILLS WITH OR WITHOUT FEVER  NAUSEA AND VOMITING THAT IS NOT CONTROLLED WITH YOUR NAUSEA MEDICATION  *UNUSUAL SHORTNESS OF BREATH  *UNUSUAL BRUISING OR BLEEDING  TENDERNESS IN MOUTH AND THROAT WITH OR WITHOUT PRESENCE OF ULCERS  *URINARY PROBLEMS  *BOWEL PROBLEMS  UNUSUAL RASH Items with * indicate a potential emergency and should be followed up as soon as possible.  Feel free to call the clinic should you have any questions or concerns. The clinic phone number is [phone].  Please show the [facility] at check-in to the Emergency Department and triage nurse.

## 2018-05-09 NOTE — Assessment & Plan Note (Signed)
She has incidental finding of pulmonary embolism.  We discussed the risk, benefits, side effects of anticoagulation therapy. The plan will be to continue on treatment for minimum 6 months to a year. For that reason, I do not recommend her to interrupt anticoagulation therapy for elective procedures if possible 

## 2018-05-09 NOTE — Progress Notes (Signed)
[facility] OFFICE PROGRESS NOTE  Patient Care Team: [name], MD as PCP - General (Family Medicine)  ASSESSMENT & PLAN:  Pancreatic cancer [facility]) With recent dose adjustment, she tolerated chemotherapy better but still had nausea, severe neuropathy as well as diarrhea Her surgeon seems to think that her diarrhea could be unrelated to treatment I recommend dexamethasone taper to 1/2 tab once a day for 3 days after chemo We will proceed with treatment without delay and with further dose reduction of oxaliplatin If neuropathy is worse in the next cycle, I will stop oxaliplatin completely  Peripheral neuropathy due to chemotherapy [facility]) Her neuropathy is worse despite 50% dose adjustment of oxaliplatin I plan to reduce it further and consider omitting that completely in her next cycle   Pulmonary embolism ([place]) She has incidental finding of pulmonary embolism.  We discussed the risk, benefits, side effects of anticoagulation therapy. The plan will be to continue on treatment for minimum 6 months to a year. For that reason, I do not recommend her to interrupt anticoagulation therapy for elective procedures if possible  Diarrhea due to drug She has intermittent diarrhea I will reserve checking for pancreatic insufficiency while on chemotherapy She will continue Imodium and Lomotil as needed   No orders of the defined types were placed in this encounter.   INTERVAL HISTORY: Please see below for problem oriented charting. 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 Mixed attenuation mass emanates from the right adnexa measuring 10.8 x 8.0 cm very suspicious for right ovarian carcinoma. 2. Abnormality of the tail of the pancreas may be due to mild pancreatitis and small pseudocyst formation, but neoplasm cannot be excluded. 3. Small amount of ascites within abdomen and pelvis. 4. Small nonobstructing renal calculi bilaterally.     08/30/2017 Pathology Results    1. Uterus and cervix, with left fallopian tube - ENDOMETRIOID ADENOCARCINOMA, FIGO GRADE I, ARISING IN A BACKGROUND OF DIFFUSE COMPLEX ATYPICAL HYPERPLASIA. - CARCINOMA INVADES FOR OF DEPTH OF 0.2 CM WHERE THICKNESS OF MYOMETRIAL WALL IS 2.1 CM. - ALL RESECTION MARGINS ARE NEGATIVE FOR CARCINOMA. - NEGATIVE FOR LYMPHOVASCULAR OR PERINEURAL INVASION. - CERVICAL STROMA IS NOT INVOLVED. - SEE ONCOLOGY TABLE. - SEE NOTE 2. Ovary and fallopian tube, right - PRIMARY OVARIAN ENDOMETRIOID ADENOCARCINOMA, FIGO GRADE II, 12 CM. - THE OVARIAN SURFACE IS FOCALLY INVOLVED BY CARCINOMA. - NEGATIVE FOR LYMPHOVASCULAR INVASION. - BENIGN UNREMARKABLE FALLOPIAN TUBE, NEGATIVE FOR CARCINOMA. - SEE ONCOLOGY TABLE. - SEE NOTE 3. Cul-de-sac biopsy - METASTATIC ADENOCARCINOMA, MOST CONSISTENT WITH PRIMARY OVARIAN ENDOMETRIOID ADENOCARCINOMA. 4. Ovary, left - BENIGN UNREMARKABLE OVARY, NEGATIVE FOR MALIGNANCY. Microscopic Comment 1. UTERUS, CARCINOMA OR CARCINOSARCOMA Procedure: Total hysterectomy with bilateral salpingo-oophorectomy. Histologic type: Endometrioid adenocarcinoma. Histologic Grade: FIGO Grade I Myometrial invasion: Depth of invasion: 2 mm Myometrial thickness: 21 mm Uterine Serosa Involvement: Not identified Cervical stromal involvement: Not identified Extent of involvement  of other organs: Not  applicable Lymphovascular invasion: Not identified Regional Lymph Nodes: Examined: 0 Sentinel 0 Non-sentinel 0 Total Tumor block for ancillary studies: 1H MMR / MSI testing: Pending Pathologic Stage Classification (pTNM, AJCC 8th edition): pT1a, pNX (v4.1.0.0) 2. OVARY or FALLOPIAN TUBE or PRIMARY PERITONEUM: Procedure: Salpingo-oophorectomy Specimen Integrity: Intact Tumor Site: Right ovary Ovarian Surface Involvement (required only if applicable): Focally involved by carcinoma Fallopian Tube Surface Involvement (required only if applicable): Not identified Tumor Size: 12 cm Histologic Type: Endometrioid adenocarcinoma Histologic Grade: Grade II Implants (required for advanced stage serous/seromucinous borderline tumors only): Not applicable Other Tissue/ Organ Involvement: Cul de sac biopsy involved by tumor Largest Extrapelvic Peritoneal Focus (required only if applicable): Not applicable Peritoneal/Ascitic Fluid: Negative for carcinoma (case # [id number]) Treatment Effect (required only for high-grade serous carcinomas): Not applicable Regional Lymph Nodes: No lymph nodes submitted or found Number of Lymph Nodes Examined: 0 Pathologic Stage Classification (pTNM, AJCC 8th Edition): pT2b, pN0 Representative Tumor Block: 2B and 2E 1. Molecular study for microsatellite instability and immunohistochemical stains for MMR-related proteins are pending and will be reported in an addendum. 2. Immunohistochemical stain show that the ovarian tumor is positive for CK7 and PAX8 (both diffuse), CDX2 (patchy and weak); and negative for CK20. This immunoprofile is consistent with the above diagnosis. Dr. Lyndon Code has reviewed this case and concurs with the above diagnosis. Molecular study for microsatellite instability and immunohistochemical stains for MMR-related proteins are pending and will be reported in an addendum    08/30/2017 Genetic Testing    Patient has genetic testing done for MSI   Results revealed patient has the following mutation(s): loss of [facility] 2    08/30/2017 Surgery    Surgeon: Mart Piggs, MD Pre-operative Diagnosis:  1. Adnexal mass 2. Abnormal uterine bleeding 3. H/o Cecal CA  Post-operative Diagnosis:  1. Adhesive disease post colon resection 2. Endometrial cancer NOS 3. Adenocarcinoma unknown origin, right ovary, suspicious for GI primary  Operation:  1. Lysis of adhesions ~30 minutes 2. Robotic-assisted laparoscopic total hysterectomy with right salpingo-oophorectomy and left salpingectomy 3. Left oophorectomy (RA-laparoscopic) 4. Pelvic washings  Findings: Adhesions of omentum to anterior abdominal wall. Enlarged cystic right ovary ~10cm. Uterus had small nodules on serosa near where right adnexa was intimate with the surface. No obvious intraoperative rupture of cyst, although in 2 areas the wall was thin and one of these areas had some bleeding. Slight scarring of left bladder dome to LUS/cervix. Uterus on frozen section c/w hyperplasia and a small focus of endometrial CA - no myo invasion, <2cm in size. Frozen section on the right adnexa was carcinoma, met from colon or possibly Gyn, favor GI primary, defer to permanent. Left ovary was WNL.      09/15/2017 Cancer Staging    Staging form: Corpus Uteri - Carcinoma and Carcinosarcoma, AJCC 8th Edition - Pathologic: FIGO Stage IA (pT1a, pN0, cM0) - Signed by Heath Lark, MD on 09/15/2017    09/26/2017 Procedure    Successful placement of a right internal jugular approach power injectable Port-A-Cath. The catheter is ready for immediate use.    11/07/2017 Imaging    1. Since 08/18/2017, similar to slight decrease in size of a pancreatic body/tail junction lesion. Cross modality comparison relative to 09/16/2017 MRI is also grossly similar. 2. No evidence of metastatic disease. 3. Aortic Atherosclerosis (ICD10-I70.0).  4. Left nephrolithiasis.    11/18/2017 Genetic Testing    MSH2  c.2237dupT pathogenic mutation identified in the CancerNext panel.  The  CancerNext gene panel offered by [employer] includes sequencing and rearrangement analysis for the following 34 genes:   APC, ATM, BARD1, BMPR1A, BRCA1, BRCA2, BRIP1, CDH1, CDK4, CDKN2A, CHEK2, DICER1, HOXB13, EPCAM, GREM1, MLH1, MRE11A, MSH2, MSH6, MUTYH, NBN, NF1, PALB2, PMS2, POLD1, POLE, PTEN, RAD50, RAD51C, RAD51D, SMAD4, SMARCA4, STK11, and TP53.  The report date is November 18, 2017.  MSH2 [id number] pathogenic mutation identified on somatic testing.  These results are consistent with a diagnosis of Lynch syndrome.     Secondary malignant neoplasm of right ovary ([place])   08/23/2017 Tumor Marker    Patient's tumor was tested for the following markers: CA-125 Results of the tumor marker test revealed 139.8    08/31/2017 Initial Diagnosis    Secondary malignant neoplasm of right ovary ([place])    09/15/2017 Cancer Staging    Staging form: Ovary, Fallopian Tube, and Primary Peritoneal Carcinoma, AJCC 8th Edition - Pathologic: Stage IIB (pT2b, pN0, cM0) - Signed by Heath Lark, MD on 09/15/2017    09/27/2017 Imaging    No evidence of metastatic disease or other acute findings within the thorax.  4 cm low-attenuation mass in pancreatic tail, highly suspicious for pancreatic carcinoma. This is caused splenic vein thrombosis, with new venous collaterals in the left upper quadrant. Consider endoscopic ultrasound with FNA for tissue diagnosis.  Stable benign hepatic hemangioma.     09/27/2017 Tumor Marker    Patient's tumor was tested for the following markers: CA-125 Results of the tumor marker test revealed 39.4    11/02/2017 Tumor Marker    Patient's tumor was tested for the following markers: CA-125 Results of the tumor marker test revealed 19    11/07/2017 Tumor Marker    Patient's tumor was tested for the following markers: CA-125 Results of the tumor marker test revealed 18.3    11/18/2017 Genetic  Testing    MSH2 c.2237dupT pathogenic mutation identified in the CancerNext panel.  The CancerNext gene panel offered by [employer] includes sequencing and rearrangement analysis for the following 34 genes:   APC, ATM, BARD1, BMPR1A, BRCA1, BRCA2, BRIP1, CDH1, CDK4, CDKN2A, CHEK2, DICER1, HOXB13, EPCAM, GREM1, MLH1, MRE11A, MSH2, MSH6, MUTYH, NBN, NF1, PALB2, PMS2, POLD1, POLE, PTEN, RAD50, RAD51C, RAD51D, SMAD4, SMARCA4, STK11, and TP53.  The report date is November 18, 2017.  MSH2 [id number] pathogenic mutation identified on somatic testing.  These results are consistent with a diagnosis of Lynch syndrome.    12/15/2017 Tumor Marker    Patient's tumor was tested for the following markers: CA-125 Results of the tumor marker test revealed 57.3    01/16/2018 Tumor Marker    Patient's tumor was tested for the following markers: CA-125 Results of the tumor marker test revealed 24.2    04/10/2018 Tumor Marker    Patient's tumor was tested for the following markers: CA-125 Results of the tumor marker test revealed 18.2     Pancreatic cancer ([place])   10/13/2017 Pathology Results    Pancreas tail mass, endoscopic ultrasound-guided, fine needle aspiration II (smears and cell block): Adenocarcinoma    11/18/2017 Genetic Testing    MSH2 c.2237dupT pathogenic mutation identified in the CancerNext panel.  The CancerNext gene panel offered by [employer] includes sequencing and rearrangement analysis for the following 34 genes:   APC, ATM, BARD1, BMPR1A, BRCA1, BRCA2, BRIP1, CDH1, CDK4, CDKN2A, CHEK2, DICER1, HOXB13, EPCAM, GREM1, MLH1, MRE11A, MSH2, MSH6, MUTYH, NBN, NF1, PALB2, PMS2, POLD1, POLE, PTEN, RAD50, RAD51C, RAD51D, SMAD4, SMARCA4, STK11, and TP53.  The report date is November 18, 2017.  MSH2 [id number] pathogenic mutation identified on somatic testing.  These results are consistent with a diagnosis of Lynch syndrome.    11/24/2017 Pathology Results    A.  "TAIL OF PANCREAS AND SPLEEN", DISTAL PANCREATECTOMY AND SPLENECTOMY: Invasive ductal adenocarcinoma, moderately to poorly differentiated with focal signet ring cell features, of pancreas (distal).   The carcinoma is 2.5 cm in greatest dimension grossly.   Treatment effects present in the form of fibrosis (50%). No lymphovascular or definite perineural invasion identified. All surgical margins are negative for tumor or high-grade dysplasia. Adjacent mucinous neoplasm, most consistent with Intraductal papillary mucinous neoplasm (IPMN) with low-grade dysplasia.   Uninvolved pancreas show atrophy and focal acute inflammation.   Twelve benign lymph nodes (0/12). Spleen with no significant histopathologic abnormalities.  PROCEDURE: distal pancreatectomy and splenectomy TUMOR SITE: distal pancreas TUMOR SIZE:  GREATEST DIMENSION: 2.5 cm  ADDITIONAL DIMENSIONS:  x  cm HISTOLOGIC TYPE: ductal adenocarcinoma HISTOLOGIC GRADE: grade 3 TUMOR EXTENSION: peripancreatic soft tissue MARGINS: negative for tumor TREATMENT EFFECT: treatment effects present in the form of fibrosis (50%). LYMPHOVASCULAR INVASION: not identified PERINEURAL INVASION: no definite evidence REGIONAL LYMPH NODES:   NUMBER OF LYMPH NODES INVOLVED: 0   NUMBER OF LYMPH NODES EXAMINED: 12 PATHOLOGIC STAGE CLASSIFICATION (pTNM, AJCC 8th Ed): ypT2, ypN0 DISTANT METASTASIS (pM): pMx ADDITIONAL PATHOLOGIC FINDINGS: mucinous neoplasm, most consistent with intraductal papillary mucinous neoplasm (IPMN) with low-grade dysplasia, is identified adjacent to the main tumor.    11/24/2017 Cancer Staging    Staging form: Exocrine Pancreas, AJCC 8th Edition - Pathologic stage from 11/24/2017: Stage IB (pT2, pN0, cM0) - Signed by Truitt Merle, MD on 01/03/2018    11/25/2017 Surgery    She had surgery at [facility] 1. Exploratory Laparotomy 2. Distal Pancreatectomy and  Splenectomy 3. Intraoperative Ultrasound 4. Open Cholecystectomy     12/15/2017 Cancer Staging    Staging form: Exocrine Pancreas, AJCC 8th Edition - Clinical: Stage IB (cT2, cN0, cM0) - Signed by Heath Lark, MD on 12/15/2017    12/28/2017 Imaging    12/28/2017 CT Abdomen  IMPRESSION: 1. Postoperative findings from recent partial pancreatectomy including a 21 cubic cm fluid collection along the pancreatic resection margin which could represent early pseudocyst. 2. Nodularity along the lateral limb of the left adrenal gland could also be postoperative but merit surveillance, as the pancreatic lesion was in close proximity to this adrenal gland on the prior CT of 11/07/2017. 3. Asymmetric fullness inferiorly in the left breast. The patient has a history of prior left breast procedures, correlation with mammography is recommended. 4. Other imaging findings of potential clinical significance: Stable hemangioma in the left hepatic lobe. Splenectomy. Aortic Atherosclerosis (ICD10-I70.0). Right hemicolectomy. Small focus of fat necrosis in the right anterior abdominal wall subcutaneous tissues near the laparotomy site. Bilateral nonobstructive nephrolithiasis.    01/02/2018 Tumor Marker    Patient's tumor was tested for the following markers: CA-19-9 Results of the tumor marker test revealed 5    01/03/2018 -  Chemotherapy    She received modified dose FOLFIRINOX    04/10/2018 Imaging    1. Distal pancreatectomy, without findings of recurrent or metastatic disease. 2. Incompletely imaged hypoenhancement within lower lobe right pulmonary artery branch is likely chronic (but interval since 10/09/2017) pulmonary embolism. Dedicated CTA could further evaluate. 3. Decrease in size of a peripancreatic complex fluid collection anteriorly, likely a resolving pseudocyst. 4. Decreased size of minimal fluid versus a borderline sized node in the gastrohepatic ligament. Recommend attention on follow-up.  5.  Hepatic steatosis with a segment 4 hemangioma. 6. Aortic Atherosclerosis (ICD10-I70.0). This is significantly age advanced.     REVIEW OF SYSTEMS:   Constitutional: Denies fevers, chills or abnormal weight loss Eyes: Denies blurriness of vision Ears, nose, mouth, throat, and face: Denies mucositis or sore throat Respiratory: Denies cough, dyspnea or wheezes Cardiovascular: Denies palpitation, chest discomfort or lower extremity swelling Skin: Denies abnormal skin rashes Lymphatics: Denies new lymphadenopathy or easy bruising Behavioral/Psych: Mood is stable, no new changes  All other systems were reviewed with the patient and are negative.  I have reviewed the past medical history, past surgical history, social history and family history with the patient and they are unchanged from previous note.  ALLERGIES:  is allergic to codeine; penicillins; and bactrim [sulfamethoxazole-trimethoprim].  MEDICATIONS:  Current Outpatient Medications  Medication Sig Dispense Refill  . acetaminophen (TYLENOL) 650 MG CR tablet Take 1,300 mg by mouth every 8 (eight) hours.    Marland Kitchen dexamethasone (DECADRON) 4 MG tablet Take 1 tablet (4 mg total) by mouth 2 (two) times daily with a meal. 60 tablet 1  . diclofenac sodium (VOLTAREN) 1 % GEL APPLY TO AFFECTED 3 LARGE JOINTS UP TO 3 TIMES DAILY AS NEEDED AS DIRECTED 300 g 1  . diphenoxylate-atropine (LOMOTIL) 2.5-0.025 MG tablet Take 1-2 tablets by mouth 4 (four) times daily as needed for diarrhea or loose stools. 1 to 2 po QID prn diarrhea 90 tablet 1  . gabapentin (NEURONTIN) 300 MG capsule TAKE 1 CAPSULE BY MOUTH THREE TIMES A DAY 270 capsule 11  . HYDROmorphone (DILAUDID) 4 MG tablet Take 1 tablet (4 mg total) by mouth every 4 (four) hours as needed for severe pain. 30 tablet 0  . hydrOXYzine (ATARAX/VISTARIL) 10 MG tablet Take 1 tablet (10 mg total) by mouth 3 (three) times daily as needed for itching. 60 tablet 1  . lidocaine-prilocaine (EMLA) cream Apply to  affected area once (Patient taking differently: Apply 1 application topically daily as needed (port). Apply to affected area once) 30 g 3  . loratadine (CLARITIN) 10 MG tablet Take 10 mg by mouth daily after breakfast.    . LORazepam (ATIVAN) 0.5 MG tablet Place 1 tablet (0.5 mg total) under the tongue every 6 (six) hours as needed for anxiety. 60 tablet 1  . metFORMIN (GLUCOPHAGE) 500 MG tablet TAKE 1 TABLET (500 MG TOTAL) BY MOUTH 2 (TWO) TIMES DAILY WITH A MEAL. 60 tablet 1  . naproxen sodium (ALEVE) 220 MG tablet Take 220 mg by mouth.    Marland Kitchen omeprazole (PRILOSEC) 20 MG capsule Take 20 mg by mouth 2 (two) times daily before a meal.     . ondansetron (ZOFRAN) 8 MG tablet Take 1 tablet (8 mg total) by mouth every 8 (eight) hours as needed for refractory nausea / vomiting. Start on day 3 after chemo. 30 tablet 1  . predniSONE (DELTASONE) 5 MG tablet TAKE 1 TABLET BY MOUTH EVERY DAY WITH BREAKFAST 30 tablet 0  . prochlorperazine (COMPAZINE) 10 MG tablet Take 1 tablet (10 mg total) by mouth every 6 (six) hours as needed (Nausea or vomiting). 90 tablet 1  . rivaroxaban (XARELTO) 20 MG TABS tablet Take 1 tablet (20 mg total) by mouth daily with supper. 30 tablet 9  . simethicone (MYLICON) 413 MG chewable tablet Chew 125 mg by mouth every 6 (six) hours as needed for flatulence.    . triamcinolone (NASACORT) 55 MCG/ACT AERO nasal inhaler Place 2 sprays into the nose daily. 1 Inhaler  12   No current facility-administered medications for this visit.    Facility-Administered Medications Ordered in Other Visits  Medication Dose Route Frequency Provider Last Rate Last Dose  . atropine injection 0.5 mg  0.5 mg Intravenous Once PRN Alvy Bimler, Sharilyn Geisinger, MD      . dextrose 5 % solution   Intravenous Once Alvy Bimler, Ladaysha Soutar, MD      . fluorouracil (ADRUCIL) 3,950 mg in sodium chloride 0.9 % 71 mL chemo infusion  1,920 mg/m2 (Order-Specific) Intravenous 1 day or 1 dose Alvy Bimler, Cassidy Tabet, MD      . irinotecan (CAMPTOSAR) 200 mg in  dextrose 5 % 500 mL chemo infusion  100 mg/m2 (Order-Specific) Intravenous Once Alvy Bimler, Javar Eshbach, MD      . leucovorin 828 mg in dextrose 5 % 250 mL infusion  400 mg/m2 (Order-Specific) Intravenous Once Alvy Bimler, Donnis Pecha, MD      . oxaliplatin (ELOXATIN) 70 mg in dextrose 5 % 500 mL chemo infusion  34 mg/m2 (Order-Specific) Intravenous Once Alvy Bimler, Jaymond Waage, MD 257 mL/hr at 05/09/18 1046 70 mg at 05/09/18 1046  . sodium chloride flush (NS) 0.9 % injection 10 mL  10 mL Intracatheter PRN Alvy Bimler, Khian Remo, MD        PHYSICAL EXAMINATION: ECOG PERFORMANCE STATUS: 1 - Symptomatic but completely ambulatory  Vitals:   05/09/18 0832  BP: (!) 142/81  Pulse: 81  Resp: 18  Temp: 98.4 F (36.9 C)  SpO2: 98%   Filed Weights   05/09/18 0832  Weight: 222 lb (100.7 kg)    GENERAL:alert, no distress and comfortable SKIN: skin color, texture, turgor are normal, no rashes or significant lesions EYES: normal, Conjunctiva are pink and non-injected, sclera clear OROPHARYNX:no exudate, no erythema and lips, buccal mucosa, and tongue normal  NECK: supple, thyroid normal size, non-tender, without nodularity LYMPH:  no palpable lymphadenopathy in the cervical, axillary or inguinal LUNGS: clear to auscultation and percussion with normal breathing effort HEART: regular rate & rhythm and no murmurs and no lower extremity edema ABDOMEN:abdomen soft, non-tender and normal bowel sounds Musculoskeletal:no cyanosis of digits and no clubbing  NEURO: alert & oriented x 3 with fluent speech, no focal motor/sensory deficits  LABORATORY DATA:  I have reviewed the data as listed    Component Value Date/Time   NA 141 05/09/2018 0743   K 4.1 05/09/2018 0743   CL 105 05/09/2018 0743   CO2 21 (L) 05/09/2018 0743   GLUCOSE 154 (H) 05/09/2018 0743   BUN 10 05/09/2018 0743   CREATININE 0.63 05/09/2018 0743   CREATININE 0.68 07/27/2017 0851   CALCIUM 9.1 05/09/2018 0743   PROT 6.7 05/09/2018 0743   ALBUMIN 3.1 (L) 05/09/2018 0743    AST 36 05/09/2018 0743   ALT 73 (H) 05/09/2018 0743   ALKPHOS 157 (H) 05/09/2018 0743   BILITOT <0.2 (L) 05/09/2018 0743   GFRNONAA >60 05/09/2018 0743   GFRNONAA 106 07/27/2017 0851   GFRAA >60 05/09/2018 0743   GFRAA 123 07/27/2017 0851    No results found for: SPEP, UPEP  Lab Results  Component Value Date   WBC 19.6 (H) 05/09/2018   NEUTROABS 12.9 (H) 05/09/2018   HGB 10.4 (L) 05/09/2018   HCT 33.9 (L) 05/09/2018   MCV 100.0 05/09/2018   PLT 461 (H) 05/09/2018      Chemistry      Component Value Date/Time   NA 141 05/09/2018 0743   K 4.1 05/09/2018 0743   CL 105 05/09/2018 0743   CO2 21 (L) 05/09/2018 1031  BUN 10 05/09/2018 0743   CREATININE 0.63 05/09/2018 0743   CREATININE 0.68 07/27/2017 0851      Component Value Date/Time   CALCIUM 9.1 05/09/2018 0743   ALKPHOS 157 (H) 05/09/2018 0743   AST 36 05/09/2018 0743   ALT 73 (H) 05/09/2018 0743   BILITOT <0.2 (L) 05/09/2018 0743       RADIOGRAPHIC STUDIES: I have personally reviewed the radiological images as listed and agreed with the findings in the report. Ct Abdomen Pelvis W Contrast  Result Date: 04/10/2018 CLINICAL DATA:  Colon, uterine, ovarian, and pancreatic cancer. Ongoing chemotherapy. Splenectomy. Right colectomy. Hysterectomy. Distal pancreatectomy. Cholecystectomy. EXAM: CT ABDOMEN AND PELVIS WITH CONTRAST TECHNIQUE: Multidetector CT imaging of the abdomen and pelvis was performed using the standard protocol following bolus administration of intravenous contrast. CONTRAST:  131m OMNIPAQUE IOHEXOL 300 MG/ML  SOLN COMPARISON:  12/27/2017 FINDINGS: Lower chest: Clear lung bases. Normal heart size without pericardial or pleural effusion. Incompletely imaged central line. Hypoattenuation within the medial right lower lobe pulmonary artery branch, including on image 1/2, is new including back to 10/09/2017 chest CT. Hepatobiliary: Hepatic steatosis. Segment 4A hepatic hemangioma again identified. No  suspicious liver lesion. Cholecystectomy, without biliary ductal dilatation. Pancreas: Distal pancreatectomy. Normal appearance of the remaining pancreatic head. The fluid collection ventral to the pancreatic head is decreased in size at 2.2 x 1.8 cm on image 53/7. Compare 3.9 x 2.8 cm on the prior. Spleen: Splenectomy. Adrenals/Urinary Tract: Normal adrenal glands. The soft tissue thickening along the lateral limb left adrenal gland has resolved. Interpolar 4 mm left renal collecting system calculus. Normal right kidney, without hydronephrosis. Normal urinary bladder. Stomach/Bowel: Normal stomach, without wall thickening. Colonic stool burden suggests constipation. Partial right hemicolectomy. Normal small bowel. Vascular/Lymphatic: Aortic atherosclerosis. Patent portal and hepatic veins. Decrease in fluid versus a lymph node in the gastrohepatic ligament. 9 mm short axis today on image 43/7 versus 11 mm on the prior exam. No enlarging abdominopelvic nodes. Reproductive: Hysterectomy.  No adnexal mass. Other: No significant free fluid. Mild pelvic floor laxity. No evidence of omental or peritoneal disease. Fat containing right paramidline ventral abdominal wall hernias x2, small. Musculoskeletal: No acute osseous abnormality. IMPRESSION: 1. Distal pancreatectomy, without findings of recurrent or metastatic disease. 2. Incompletely imaged hypoenhancement within lower lobe right pulmonary artery branch is likely chronic (but interval since 10/09/2017) pulmonary embolism. Dedicated CTA could further evaluate. 3. Decrease in size of a peripancreatic complex fluid collection anteriorly, likely a resolving pseudocyst. 4. Decreased size of minimal fluid versus a borderline sized node in the gastrohepatic ligament. Recommend attention on follow-up. 5. Hepatic steatosis with a segment 4 hemangioma. 6. Aortic Atherosclerosis (ICD10-I70.0). This is significantly age advanced. These results will be called to the ordering  clinician or representative by the Radiologist Assistant, and communication documented in the PACS or zVision Dashboard. Electronically Signed   By: KAbigail MiyamotoM.D.   On: 04/10/2018 14:16    All questions were answered. The patient knows to call the clinic with any problems, questions or concerns. No barriers to learning was detected.  I spent 25 minutes counseling the patient face to face. The total time spent in the appointment was 30 minutes and more than 50% was on counseling and review of test results  NHeath Lark MD 05/09/2018 10:52 AM

## 2018-05-09 NOTE — Assessment & Plan Note (Signed)
With recent dose adjustment, she tolerated chemotherapy better but still had nausea, severe neuropathy as well as diarrhea Her surgeon seems to think that her diarrhea could be unrelated to treatment I recommend dexamethasone taper to 1/2 tab once a day for 3 days after chemo We will proceed with treatment without delay and with further dose reduction of oxaliplatin If neuropathy is worse in the next cycle, I will stop oxaliplatin completely

## 2018-05-09 NOTE — Assessment & Plan Note (Signed)
She has intermittent diarrhea I will reserve checking for pancreatic insufficiency while on chemotherapy She will continue Imodium and Lomotil as needed

## 2018-05-09 NOTE — Assessment & Plan Note (Signed)
Her neuropathy is worse despite 50% dose adjustment of oxaliplatin I plan to reduce it further and consider omitting that completely in her next cycle

## 2018-05-11 ENCOUNTER — Other Ambulatory Visit: Payer: Self-pay

## 2018-05-11 ENCOUNTER — Inpatient Hospital Stay: Payer: BLUE CROSS/BLUE SHIELD

## 2018-05-11 VITALS — BP 142/81 | HR 100 | Temp 99.5°F | Resp 18

## 2018-05-11 DIAGNOSIS — Z1509 Genetic susceptibility to other malignant neoplasm: Secondary | ICD-10-CM | POA: Diagnosis not present

## 2018-05-11 DIAGNOSIS — I2699 Other pulmonary embolism without acute cor pulmonale: Secondary | ICD-10-CM | POA: Diagnosis not present

## 2018-05-11 DIAGNOSIS — C252 Malignant neoplasm of tail of pancreas: Secondary | ICD-10-CM

## 2018-05-11 DIAGNOSIS — Z79899 Other long term (current) drug therapy: Secondary | ICD-10-CM | POA: Diagnosis not present

## 2018-05-11 DIAGNOSIS — G620 Drug-induced polyneuropathy: Secondary | ICD-10-CM | POA: Diagnosis not present

## 2018-05-11 DIAGNOSIS — C7961 Secondary malignant neoplasm of right ovary: Secondary | ICD-10-CM | POA: Diagnosis not present

## 2018-05-11 DIAGNOSIS — Z7901 Long term (current) use of anticoagulants: Secondary | ICD-10-CM | POA: Diagnosis not present

## 2018-05-11 DIAGNOSIS — Z5111 Encounter for antineoplastic chemotherapy: Secondary | ICD-10-CM | POA: Diagnosis not present

## 2018-05-11 DIAGNOSIS — C541 Malignant neoplasm of endometrium: Secondary | ICD-10-CM | POA: Diagnosis not present

## 2018-05-11 DIAGNOSIS — K521 Toxic gastroenteritis and colitis: Secondary | ICD-10-CM | POA: Diagnosis not present

## 2018-05-11 DIAGNOSIS — E099 Drug or chemical induced diabetes mellitus without complications: Secondary | ICD-10-CM | POA: Diagnosis not present

## 2018-05-11 DIAGNOSIS — R112 Nausea with vomiting, unspecified: Secondary | ICD-10-CM | POA: Diagnosis not present

## 2018-05-11 MED ORDER — SODIUM CHLORIDE 0.9% FLUSH
10.0000 mL | INTRAVENOUS | Status: DC | PRN
  Administered 2018-05-11: 10 mL
  Filled 2018-05-11: qty 10

## 2018-05-11 MED ORDER — PEGFILGRASTIM INJECTION 6 MG/0.6ML ~~LOC~~
PREFILLED_SYRINGE | SUBCUTANEOUS | Status: AC
  Filled 2018-05-11: qty 0.6

## 2018-05-11 MED ORDER — PEGFILGRASTIM INJECTION 6 MG/0.6ML ~~LOC~~
6.0000 mg | PREFILLED_SYRINGE | Freq: Once | SUBCUTANEOUS | Status: AC
  Administered 2018-05-11: 6 mg via SUBCUTANEOUS

## 2018-05-11 MED ORDER — HEPARIN SOD (PORK) LOCK FLUSH 100 UNIT/ML IV SOLN
500.0000 [IU] | Freq: Once | INTRAVENOUS | Status: AC | PRN
  Administered 2018-05-11: 500 [IU]
  Filled 2018-05-11: qty 5

## 2018-05-12 DIAGNOSIS — E860 Dehydration: Secondary | ICD-10-CM | POA: Diagnosis not present

## 2018-05-15 ENCOUNTER — Ambulatory Visit: Payer: BLUE CROSS/BLUE SHIELD | Admitting: Physician Assistant

## 2018-05-15 ENCOUNTER — Encounter: Payer: Self-pay | Admitting: Physician Assistant

## 2018-05-15 ENCOUNTER — Other Ambulatory Visit: Payer: Self-pay

## 2018-05-15 VITALS — BP 138/92 | HR 107 | Resp 14 | Ht 62.0 in | Wt 222.8 lb

## 2018-05-15 DIAGNOSIS — Z79899 Other long term (current) drug therapy: Secondary | ICD-10-CM

## 2018-05-15 DIAGNOSIS — C7961 Secondary malignant neoplasm of right ovary: Secondary | ICD-10-CM

## 2018-05-15 DIAGNOSIS — Z9081 Acquired absence of spleen: Secondary | ICD-10-CM

## 2018-05-15 DIAGNOSIS — M25561 Pain in right knee: Secondary | ICD-10-CM

## 2018-05-15 DIAGNOSIS — D490 Neoplasm of unspecified behavior of digestive system: Secondary | ICD-10-CM

## 2018-05-15 DIAGNOSIS — Z1589 Genetic susceptibility to other disease: Secondary | ICD-10-CM | POA: Diagnosis not present

## 2018-05-15 DIAGNOSIS — M25562 Pain in left knee: Secondary | ICD-10-CM | POA: Diagnosis not present

## 2018-05-15 DIAGNOSIS — M0609 Rheumatoid arthritis without rheumatoid factor, multiple sites: Secondary | ICD-10-CM

## 2018-05-15 DIAGNOSIS — Z85038 Personal history of other malignant neoplasm of large intestine: Secondary | ICD-10-CM

## 2018-05-15 DIAGNOSIS — M170 Bilateral primary osteoarthritis of knee: Secondary | ICD-10-CM | POA: Diagnosis not present

## 2018-05-15 DIAGNOSIS — G8929 Other chronic pain: Secondary | ICD-10-CM | POA: Diagnosis not present

## 2018-05-15 DIAGNOSIS — Z8261 Family history of arthritis: Secondary | ICD-10-CM

## 2018-05-15 MED ORDER — PREDNISONE 5 MG PO TABS: ORAL_TABLET | ORAL | 0 refills | Status: DC

## 2018-05-15 MED ORDER — LIDOCAINE HCL 1 % IJ SOLN
1.5000 mL | INTRAMUSCULAR | Status: AC | PRN
  Administered 2018-05-15: 1.5 mL

## 2018-05-15 MED ORDER — TRIAMCINOLONE ACETONIDE 40 MG/ML IJ SUSP
40.0000 mg | INTRAMUSCULAR | Status: AC | PRN
  Administered 2018-05-15: 40 mg via INTRA_ARTICULAR

## 2018-05-23 ENCOUNTER — Telehealth: Payer: Self-pay | Admitting: Hematology and Oncology

## 2018-05-23 ENCOUNTER — Inpatient Hospital Stay: Payer: BLUE CROSS/BLUE SHIELD | Admitting: Hematology and Oncology

## 2018-05-23 ENCOUNTER — Inpatient Hospital Stay: Payer: BLUE CROSS/BLUE SHIELD

## 2018-05-23 ENCOUNTER — Encounter: Payer: Self-pay | Admitting: Hematology and Oncology

## 2018-05-23 ENCOUNTER — Other Ambulatory Visit: Payer: Self-pay

## 2018-05-23 VITALS — BP 137/76 | HR 70 | Temp 98.5°F | Resp 18 | Ht 62.0 in | Wt 219.6 lb

## 2018-05-23 DIAGNOSIS — C7961 Secondary malignant neoplasm of right ovary: Secondary | ICD-10-CM

## 2018-05-23 DIAGNOSIS — R112 Nausea with vomiting, unspecified: Secondary | ICD-10-CM | POA: Diagnosis not present

## 2018-05-23 DIAGNOSIS — T451X5A Adverse effect of antineoplastic and immunosuppressive drugs, initial encounter: Secondary | ICD-10-CM

## 2018-05-23 DIAGNOSIS — Z7901 Long term (current) use of anticoagulants: Secondary | ICD-10-CM | POA: Diagnosis not present

## 2018-05-23 DIAGNOSIS — C541 Malignant neoplasm of endometrium: Secondary | ICD-10-CM | POA: Diagnosis not present

## 2018-05-23 DIAGNOSIS — E099 Drug or chemical induced diabetes mellitus without complications: Secondary | ICD-10-CM | POA: Diagnosis not present

## 2018-05-23 DIAGNOSIS — Z1509 Genetic susceptibility to other malignant neoplasm: Secondary | ICD-10-CM | POA: Diagnosis not present

## 2018-05-23 DIAGNOSIS — I2699 Other pulmonary embolism without acute cor pulmonale: Secondary | ICD-10-CM

## 2018-05-23 DIAGNOSIS — C252 Malignant neoplasm of tail of pancreas: Secondary | ICD-10-CM

## 2018-05-23 DIAGNOSIS — Z5111 Encounter for antineoplastic chemotherapy: Secondary | ICD-10-CM | POA: Diagnosis not present

## 2018-05-23 DIAGNOSIS — G620 Drug-induced polyneuropathy: Secondary | ICD-10-CM | POA: Diagnosis not present

## 2018-05-23 DIAGNOSIS — K521 Toxic gastroenteritis and colitis: Secondary | ICD-10-CM | POA: Diagnosis not present

## 2018-05-23 DIAGNOSIS — Z79899 Other long term (current) drug therapy: Secondary | ICD-10-CM | POA: Diagnosis not present

## 2018-05-23 LAB — CMP (CANCER CENTER ONLY)
ALT: 49 U/L — ABNORMAL HIGH (ref 0–44)
AST: 24 U/L (ref 15–41)
Albumin: 3.6 g/dL (ref 3.5–5.0)
Alkaline Phosphatase: 188 U/L — ABNORMAL HIGH (ref 38–126)
Anion gap: 11 (ref 5–15)
BUN: 15 mg/dL (ref 6–20)
CO2: 23 mmol/L (ref 22–32)
Calcium: 9.1 mg/dL (ref 8.9–10.3)
Chloride: 106 mmol/L (ref 98–111)
Creatinine: 0.71 mg/dL (ref 0.44–1.00)
GFR, Est AFR Am: >60 mL/min (ref >60)
GFR, Estimated: >60 mL/min (ref >60)
Glucose, Bld: 221 mg/dL — ABNORMAL HIGH (ref 70–99)
Potassium: 4.3 mmol/L (ref 3.5–5.1)
Sodium: 140 mmol/L (ref 135–145)
Total Bilirubin: 0.2 mg/dL — ABNORMAL LOW (ref 0.3–1.2)
Total Protein: 6.9 g/dL (ref 6.5–8.1)

## 2018-05-23 LAB — CBC WITH DIFFERENTIAL (CANCER CENTER ONLY)
Abs Immature Granulocytes: 2.08 10*3/uL — ABNORMAL HIGH (ref 0.00–0.07)
Basophils Absolute: 0.2 10*3/uL — ABNORMAL HIGH (ref 0.0–0.1)
Basophils Relative: 1 %
Eosinophils Absolute: 0 10*3/uL (ref 0.0–0.5)
Eosinophils Relative: 0 %
HCT: 35.9 % — ABNORMAL LOW (ref 36.0–46.0)
Hemoglobin: 11.1 g/dL — ABNORMAL LOW (ref 12.0–15.0)
Immature Granulocytes: 7 %
Lymphocytes Relative: 8 %
Lymphs Abs: 2.2 10*3/uL (ref 0.7–4.0)
MCH: 31.2 pg (ref 26.0–34.0)
MCHC: 30.9 g/dL (ref 30.0–36.0)
MCV: 100.8 fL — ABNORMAL HIGH (ref 80.0–100.0)
Monocytes Absolute: 1 10*3/uL (ref 0.1–1.0)
Monocytes Relative: 4 %
Neutro Abs: 23.5 10*3/uL — ABNORMAL HIGH (ref 1.7–7.7)
Neutrophils Relative %: 80 %
Platelet Count: 458 10*3/uL — ABNORMAL HIGH (ref 150–400)
RBC: 3.56 MIL/uL — ABNORMAL LOW (ref 3.87–5.11)
RDW: 20.4 % — ABNORMAL HIGH (ref 11.5–15.5)
WBC Count: 29.1 10*3/uL — ABNORMAL HIGH (ref 4.0–10.5)
nRBC: 0.4 % — ABNORMAL HIGH (ref 0.0–0.2)

## 2018-05-23 LAB — MAGNESIUM: Magnesium: 2 mg/dL (ref 1.7–2.4)

## 2018-05-23 MED ORDER — DIPHENHYDRAMINE HCL 50 MG/ML IJ SOLN
INTRAMUSCULAR | Status: AC
  Filled 2018-05-23: qty 1

## 2018-05-23 MED ORDER — DEXTROSE 5 % IV SOLN
Freq: Once | INTRAVENOUS | Status: AC
  Administered 2018-05-23: 11:00:00 via INTRAVENOUS
  Filled 2018-05-23: qty 250

## 2018-05-23 MED ORDER — OXALIPLATIN CHEMO INJECTION 100 MG/20ML
34.0000 mg/m2 | Freq: Once | INTRAVENOUS | Status: AC
  Administered 2018-05-23: 70 mg via INTRAVENOUS
  Filled 2018-05-23: qty 14

## 2018-05-23 MED ORDER — DEXTROSE 5 % IV SOLN
Freq: Once | INTRAVENOUS | Status: AC
  Administered 2018-05-23: 12:00:00 via INTRAVENOUS
  Filled 2018-05-23: qty 250

## 2018-05-23 MED ORDER — IRINOTECAN HCL CHEMO INJECTION 100 MG/5ML
100.0000 mg/m2 | Freq: Once | INTRAVENOUS | Status: AC
  Administered 2018-05-23: 200 mg via INTRAVENOUS
  Filled 2018-05-23: qty 10

## 2018-05-23 MED ORDER — ATROPINE SULFATE 1 MG/ML IJ SOLN
0.5000 mg | Freq: Once | INTRAMUSCULAR | Status: AC | PRN
  Administered 2018-05-23: 0.5 mg via INTRAVENOUS

## 2018-05-23 MED ORDER — PALONOSETRON HCL INJECTION 0.25 MG/5ML
0.2500 mg | Freq: Once | INTRAVENOUS | Status: AC
  Administered 2018-05-23: 0.25 mg via INTRAVENOUS

## 2018-05-23 MED ORDER — SODIUM CHLORIDE 0.9 % IV SOLN
1920.0000 mg/m2 | INTRAVENOUS | Status: DC
  Administered 2018-05-23: 3950 mg via INTRAVENOUS
  Filled 2018-05-23: qty 79

## 2018-05-23 MED ORDER — SODIUM CHLORIDE 0.9% FLUSH
10.0000 mL | Freq: Once | INTRAVENOUS | Status: AC
  Administered 2018-05-23: 10 mL
  Filled 2018-05-23: qty 10

## 2018-05-23 MED ORDER — DIPHENHYDRAMINE HCL 50 MG/ML IJ SOLN
50.0000 mg | Freq: Once | INTRAMUSCULAR | Status: AC
  Administered 2018-05-23: 50 mg via INTRAVENOUS

## 2018-05-23 MED ORDER — PALONOSETRON HCL INJECTION 0.25 MG/5ML
INTRAVENOUS | Status: AC
  Filled 2018-05-23: qty 5

## 2018-05-23 MED ORDER — SODIUM CHLORIDE 0.9 % IV SOLN
Freq: Once | INTRAVENOUS | Status: AC
  Administered 2018-05-23: 11:00:00 via INTRAVENOUS
  Filled 2018-05-23: qty 5

## 2018-05-23 MED ORDER — ATROPINE SULFATE 1 MG/ML IJ SOLN
INTRAMUSCULAR | Status: AC
  Filled 2018-05-23: qty 1

## 2018-05-23 MED ORDER — LEUCOVORIN CALCIUM INJECTION 350 MG
400.0000 mg/m2 | Freq: Once | INTRAVENOUS | Status: AC
  Administered 2018-05-23: 828 mg via INTRAVENOUS
  Filled 2018-05-23: qty 41.4

## 2018-05-23 MED ORDER — FAMOTIDINE IN NACL 20-0.9 MG/50ML-% IV SOLN: 20.0000 mg | Freq: Once | INTRAVENOUS | Status: DC

## 2018-05-23 MED ORDER — LORAZEPAM 0.5 MG PO TABS: 0.5000 mg | ORAL_TABLET | Freq: Four times a day (QID) | ORAL | 1 refills | Status: DC | PRN

## 2018-05-23 MED ORDER — SODIUM CHLORIDE 0.9 % IV SOLN
20.0000 mg | Freq: Once | INTRAVENOUS | Status: AC
  Administered 2018-05-23: 20 mg via INTRAVENOUS
  Filled 2018-05-23: qty 2

## 2018-05-23 NOTE — Patient Instructions (Signed)
[facility] Discharge Instructions for Patients Receiving Chemotherapy  Today you received the following chemotherapy agents:  Leucovorin, Fluorouracil, Camptosar, Oxaliplatin.  To help prevent nausea and vomiting after your treatment, we encourage you to take your nausea medication as directed.    If you develop nausea and vomiting that is not controlled by your nausea medication, call the clinic.   BELOW ARE SYMPTOMS THAT SHOULD BE REPORTED IMMEDIATELY:  *FEVER GREATER THAN 100.5 F  *CHILLS WITH OR WITHOUT FEVER  NAUSEA AND VOMITING THAT IS NOT CONTROLLED WITH YOUR NAUSEA MEDICATION  *UNUSUAL SHORTNESS OF BREATH  *UNUSUAL BRUISING OR BLEEDING  TENDERNESS IN MOUTH AND THROAT WITH OR WITHOUT PRESENCE OF ULCERS  *URINARY PROBLEMS  *BOWEL PROBLEMS  UNUSUAL RASH Items with * indicate a potential emergency and should be followed up as soon as possible.  Feel free to call the clinic should you have any questions or concerns. The clinic phone number is [phone].  Please show the [facility] at check-in to the Emergency Department and triage nurse.

## 2018-05-23 NOTE — Assessment & Plan Note (Signed)
Her neuropathy is stable with 50% dose adjustment of oxaliplatin We will continue the same dose

## 2018-05-23 NOTE — Assessment & Plan Note (Signed)
She has incidental finding of pulmonary embolism.  We discussed the risk, benefits, side effects of anticoagulation therapy. The plan will be to continue on treatment for minimum 6 months to a year. For that reason, I do not recommend her to interrupt anticoagulation therapy for elective procedures if possible

## 2018-05-23 NOTE — Progress Notes (Signed)
[place] OFFICE PROGRESS NOTE  Patient Care Team: [name], MD as PCP - General (Family Medicine)  ASSESSMENT & PLAN:  Pancreatic cancer [facility]) With recent dose adjustment, she tolerated chemotherapy better but still had nausea, neuropathy as well as diarrhea I recommend dexamethasone taper to 1/2 tab once a day for 3 days after chemo We will proceed with treatment without delay and with dose reduction of oxaliplatin as before  Secondary malignant neoplasm of right ovary [facility]) Her recent CT scan and tumor marker is stable.  Observe only  Pulmonary embolism ([place]) She has incidental finding of pulmonary embolism.  We discussed the risk, benefits, side effects of anticoagulation therapy. The plan will be to continue on treatment for minimum 6 months to a year. For that reason, I do not recommend her to interrupt anticoagulation therapy for elective procedures if possible  Peripheral neuropathy due to chemotherapy [facility]) Her neuropathy is stable with 50% dose adjustment of oxaliplatin We will continue the same dose   Orders Placed This Encounter  Procedures  . CA 19.9    Standing Status:   Standing    Number of Occurrences:   11    Standing Expiration Date:   05/23/2019    INTERVAL HISTORY: Please see below for problem oriented charting. 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 Mixed attenuation mass emanates from the right adnexa measuring 10.8 x 8.0 cm very suspicious for right ovarian carcinoma. 2. Abnormality of the tail of the  pancreas may be due to mild pancreatitis and small pseudocyst formation, but neoplasm cannot be excluded. 3. Small amount of ascites within abdomen and pelvis. 4. Small nonobstructing renal calculi bilaterally.     08/30/2017 Pathology Results    1. Uterus and cervix, with left fallopian tube - ENDOMETRIOID ADENOCARCINOMA, FIGO GRADE I, ARISING IN A BACKGROUND OF DIFFUSE COMPLEX ATYPICAL HYPERPLASIA. - CARCINOMA INVADES FOR OF DEPTH OF 0.2 CM WHERE THICKNESS OF MYOMETRIAL WALL IS 2.1 CM. - ALL RESECTION MARGINS ARE NEGATIVE FOR CARCINOMA. - NEGATIVE FOR LYMPHOVASCULAR OR PERINEURAL INVASION. - CERVICAL STROMA IS NOT INVOLVED. - SEE ONCOLOGY TABLE. - SEE NOTE 2. Ovary and fallopian tube, right - PRIMARY OVARIAN ENDOMETRIOID ADENOCARCINOMA, FIGO GRADE II, 12 CM. - THE OVARIAN SURFACE IS FOCALLY INVOLVED BY CARCINOMA. - NEGATIVE FOR LYMPHOVASCULAR INVASION. - BENIGN UNREMARKABLE FALLOPIAN TUBE, NEGATIVE FOR CARCINOMA. - SEE ONCOLOGY TABLE. - SEE NOTE 3. Cul-de-sac biopsy - METASTATIC ADENOCARCINOMA, MOST CONSISTENT WITH PRIMARY OVARIAN ENDOMETRIOID ADENOCARCINOMA. 4. Ovary, left - BENIGN UNREMARKABLE OVARY, NEGATIVE FOR MALIGNANCY. Microscopic Comment 1. UTERUS, CARCINOMA OR CARCINOSARCOMA Procedure: Total hysterectomy with bilateral salpingo-oophorectomy. Histologic type: Endometrioid adenocarcinoma. Histologic Grade: FIGO Grade I Myometrial invasion: Depth of invasion: 2 mm Myometrial thickness: 21 mm Uterine Serosa Involvement: Not identified Cervical stromal involvement: Not identified Extent of involvement of other organs: Not applicable Lymphovascular invasion: Not identified Regional Lymph Nodes: Examined: 0 Sentinel 0 Non-sentinel 0 Total Tumor block for ancillary studies: 1H MMR / MSI testing: Pending Pathologic Stage Classification (pTNM, AJCC 8th edition): pT1a, pNX (v4.1.0.0) 2. OVARY or FALLOPIAN TUBE or PRIMARY PERITONEUM:  Procedure:  Salpingo-oophorectomy Specimen Integrity: Intact Tumor Site: Right ovary Ovarian Surface Involvement (required only if applicable): Focally involved by carcinoma Fallopian Tube Surface Involvement (required only if applicable): Not identified Tumor Size: 12 cm Histologic Type: Endometrioid adenocarcinoma Histologic Grade: Grade II Implants (required for advanced stage serous/seromucinous borderline tumors only): Not applicable Other Tissue/ Organ Involvement: Cul de sac biopsy involved by tumor Largest Extrapelvic Peritoneal Focus (required only if applicable): Not applicable Peritoneal/Ascitic Fluid: Negative for carcinoma (case # [id number]) Treatment Effect (required only for high-grade serous carcinomas): Not applicable Regional Lymph Nodes: No lymph nodes submitted or found Number of Lymph Nodes Examined: 0 Pathologic Stage Classification (pTNM, AJCC 8th Edition): pT2b, pN0 Representative Tumor Block: 2B and 2E 1. Molecular study for microsatellite instability and immunohistochemical stains for MMR-related proteins are pending and will be reported in an addendum. 2. Immunohistochemical stain show that the ovarian tumor is positive for CK7 and PAX8 (both diffuse), CDX2 (patchy and weak); and negative for CK20. This immunoprofile is consistent with the above diagnosis. Dr. Lyndon Code has reviewed this case and concurs with the above diagnosis. Molecular study for microsatellite instability and immunohistochemical stains for MMR-related proteins are pending and will be reported in an addendum    08/30/2017 Genetic Testing    Patient has genetic testing done for MSI  Results revealed patient has the following mutation(s): loss of [facility] 2    08/30/2017 Surgery    Surgeon: Mart Piggs, MD Pre-operative Diagnosis:  1. Adnexal mass 2. Abnormal uterine bleeding 3. H/o Cecal CA  Post-operative Diagnosis:  1. Adhesive disease post colon resection 2. Endometrial cancer NOS 3. Adenocarcinoma  unknown origin, right ovary, suspicious for GI primary  Operation:  1. Lysis of adhesions ~30 minutes 2. Robotic-assisted laparoscopic total hysterectomy with right salpingo-oophorectomy and left salpingectomy 3. Left oophorectomy (RA-laparoscopic) 4. Pelvic washings  Findings: Adhesions of omentum to anterior abdominal wall. Enlarged cystic right ovary ~10cm. Uterus had small nodules on serosa near where right adnexa was intimate with the surface. No obvious intraoperative rupture of cyst, although in 2 areas the wall was thin and one of these areas had some bleeding. Slight scarring of left bladder dome to LUS/cervix. Uterus on frozen section c/w hyperplasia and a small focus of endometrial CA - no myo invasion, <2cm in size. Frozen section on the right adnexa was carcinoma, met from colon or possibly Gyn, favor GI primary, defer to permanent. Left ovary was WNL.      09/15/2017 Cancer Staging    Staging form: Corpus Uteri - Carcinoma and Carcinosarcoma, AJCC 8th Edition - Pathologic: FIGO Stage IA (pT1a, pN0, cM0) - Signed by Heath Lark, MD on 09/15/2017    09/26/2017 Procedure    Successful placement of a right internal jugular approach power injectable Port-A-Cath. The catheter is ready for immediate use.    11/07/2017 Imaging    1. Since 08/18/2017, similar to slight decrease in size of a pancreatic body/tail junction lesion. Cross modality comparison relative to 09/16/2017 MRI is also grossly similar. 2. No evidence of metastatic disease. 3. Aortic Atherosclerosis (ICD10-I70.0).  4. Left nephrolithiasis.    11/18/2017 Genetic Testing    MSH2 c.2237dupT pathogenic mutation identified in the CancerNext panel.  The CancerNext gene panel offered by [employer] includes sequencing and rearrangement analysis for the following 34 genes:   APC, ATM, BARD1, BMPR1A, BRCA1, BRCA2, BRIP1, CDH1, CDK4, CDKN2A, CHEK2, DICER1, HOXB13, EPCAM, GREM1, MLH1, MRE11A, MSH2, MSH6, MUTYH, NBN, NF1, PALB2,  PMS2, POLD1, POLE, PTEN, RAD50, RAD51C,  RAD51D, SMAD4, SMARCA4, STK11, and TP53.  The report date is November 18, 2017.  MSH2 [id number] pathogenic mutation identified on somatic testing.  These results are consistent with a diagnosis of Lynch syndrome.     Secondary malignant neoplasm of right ovary ([place])   08/23/2017 Tumor Marker    Patient's tumor was tested for the following markers: CA-125 Results of the tumor marker test revealed 139.8    08/31/2017 Initial Diagnosis    Secondary malignant neoplasm of right ovary ([place])    09/15/2017 Cancer Staging    Staging form: Ovary, Fallopian Tube, and Primary Peritoneal Carcinoma, AJCC 8th Edition - Pathologic: Stage IIB (pT2b, pN0, cM0) - Signed by Heath Lark, MD on 09/15/2017    09/27/2017 Imaging    No evidence of metastatic disease or other acute findings within the thorax.  4 cm low-attenuation mass in pancreatic tail, highly suspicious for pancreatic carcinoma. This is caused splenic vein thrombosis, with new venous collaterals in the left upper quadrant. Consider endoscopic ultrasound with FNA for tissue diagnosis.  Stable benign hepatic hemangioma.     09/27/2017 Tumor Marker    Patient's tumor was tested for the following markers: CA-125 Results of the tumor marker test revealed 39.4    11/02/2017 Tumor Marker    Patient's tumor was tested for the following markers: CA-125 Results of the tumor marker test revealed 19    11/07/2017 Tumor Marker    Patient's tumor was tested for the following markers: CA-125 Results of the tumor marker test revealed 18.3    11/18/2017 Genetic Testing    MSH2 c.2237dupT pathogenic mutation identified in the CancerNext panel.  The CancerNext gene panel offered by [employer] includes sequencing and rearrangement analysis for the following 34 genes:   APC, ATM, BARD1, BMPR1A, BRCA1, BRCA2, BRIP1, CDH1, CDK4, CDKN2A, CHEK2, DICER1, HOXB13, EPCAM, GREM1, MLH1, MRE11A, MSH2, MSH6, MUTYH,  NBN, NF1, PALB2, PMS2, POLD1, POLE, PTEN, RAD50, RAD51C, RAD51D, SMAD4, SMARCA4, STK11, and TP53.  The report date is November 18, 2017.  MSH2 [id number] pathogenic mutation identified on somatic testing.  These results are consistent with a diagnosis of Lynch syndrome.    12/15/2017 Tumor Marker    Patient's tumor was tested for the following markers: CA-125 Results of the tumor marker test revealed 57.3    01/16/2018 Tumor Marker    Patient's tumor was tested for the following markers: CA-125 Results of the tumor marker test revealed 24.2    04/10/2018 Tumor Marker    Patient's tumor was tested for the following markers: CA-125 Results of the tumor marker test revealed 18.2     Pancreatic cancer ([place])   10/13/2017 Pathology Results    Pancreas tail mass, endoscopic ultrasound-guided, fine needle aspiration II (smears and cell block): Adenocarcinoma    11/18/2017 Genetic Testing    MSH2 c.2237dupT pathogenic mutation identified in the CancerNext panel.  The CancerNext gene panel offered by [employer] includes sequencing and rearrangement analysis for the following 34 genes:   APC, ATM, BARD1, BMPR1A, BRCA1, BRCA2, BRIP1, CDH1, CDK4, CDKN2A, CHEK2, DICER1, HOXB13, EPCAM, GREM1, MLH1, MRE11A, MSH2, MSH6, MUTYH, NBN, NF1, PALB2, PMS2, POLD1, POLE, PTEN, RAD50, RAD51C, RAD51D, SMAD4, SMARCA4, STK11, and TP53.  The report date is November 18, 2017.  MSH2 [id number] pathogenic mutation identified on somatic testing.  These results are consistent with a diagnosis of Lynch syndrome.    11/24/2017 Pathology Results    A. "TAIL OF PANCREAS AND SPLEEN", DISTAL PANCREATECTOMY AND SPLENECTOMY: Invasive ductal adenocarcinoma, moderately to poorly differentiated  with focal signet ring cell features, of pancreas (distal).   The carcinoma is 2.5 cm in greatest dimension grossly.   Treatment effects present in the form of fibrosis (50%). No  lymphovascular or definite perineural invasion identified. All surgical margins are negative for tumor or high-grade dysplasia. Adjacent mucinous neoplasm, most consistent with Intraductal papillary mucinous neoplasm (IPMN) with low-grade dysplasia.   Uninvolved pancreas show atrophy and focal acute inflammation.   Twelve benign lymph nodes (0/12). Spleen with no significant histopathologic abnormalities.  PROCEDURE: distal pancreatectomy and splenectomy TUMOR SITE: distal pancreas TUMOR SIZE:  GREATEST DIMENSION: 2.5 cm  ADDITIONAL DIMENSIONS:  x  cm HISTOLOGIC TYPE: ductal adenocarcinoma HISTOLOGIC GRADE: grade 3 TUMOR EXTENSION: peripancreatic soft tissue MARGINS: negative for tumor TREATMENT EFFECT: treatment effects present in the form of fibrosis (50%). LYMPHOVASCULAR INVASION: not identified PERINEURAL INVASION: no definite evidence REGIONAL LYMPH NODES:   NUMBER OF LYMPH NODES INVOLVED: 0   NUMBER OF LYMPH NODES EXAMINED: 12 PATHOLOGIC STAGE CLASSIFICATION (pTNM, AJCC 8th Ed): ypT2, ypN0 DISTANT METASTASIS (pM): pMx ADDITIONAL PATHOLOGIC FINDINGS: mucinous neoplasm, most consistent with intraductal papillary mucinous neoplasm (IPMN) with low-grade dysplasia, is identified adjacent to the main tumor.    11/24/2017 Cancer Staging    Staging form: Exocrine Pancreas, AJCC 8th Edition - Pathologic stage from 11/24/2017: Stage IB (pT2, pN0, cM0) - Signed by Truitt Merle, MD on 01/03/2018    11/25/2017 Surgery    She had surgery at [facility] 1. Exploratory Laparotomy 2. Distal Pancreatectomy and Splenectomy 3. Intraoperative Ultrasound 4. Open Cholecystectomy     12/15/2017 Cancer Staging    Staging form: Exocrine Pancreas, AJCC 8th Edition - Clinical: Stage IB (cT2, cN0, cM0) - Signed by Heath Lark, MD on 12/15/2017    12/28/2017 Imaging    12/28/2017 CT Abdomen  IMPRESSION: 1. Postoperative findings from recent partial pancreatectomy  including a 21 cubic cm fluid collection along the pancreatic resection margin which could represent early pseudocyst. 2. Nodularity along the lateral limb of the left adrenal gland could also be postoperative but merit surveillance, as the pancreatic lesion was in close proximity to this adrenal gland on the prior CT of 11/07/2017. 3. Asymmetric fullness inferiorly in the left breast. The patient has a history of prior left breast procedures, correlation with mammography is recommended. 4. Other imaging findings of potential clinical significance: Stable hemangioma in the left hepatic lobe. Splenectomy. Aortic Atherosclerosis (ICD10-I70.0). Right hemicolectomy. Small focus of fat necrosis in the right anterior abdominal wall subcutaneous tissues near the laparotomy site. Bilateral nonobstructive nephrolithiasis.    01/02/2018 Tumor Marker    Patient's tumor was tested for the following markers: CA-19-9 Results of the tumor marker test revealed 5    01/03/2018 -  Chemotherapy    She received modified dose FOLFIRINOX    04/10/2018 Imaging    1. Distal pancreatectomy, without findings of recurrent or metastatic disease. 2. Incompletely imaged hypoenhancement within lower lobe right pulmonary artery branch is likely chronic (but interval since 10/09/2017) pulmonary embolism. Dedicated CTA could further evaluate. 3. Decrease in size of a peripancreatic complex fluid collection anteriorly, likely a resolving pseudocyst. 4. Decreased size of minimal fluid versus a borderline sized node in the gastrohepatic ligament. Recommend attention on follow-up. 5. Hepatic steatosis with a segment 4 hemangioma. 6. Aortic Atherosclerosis (ICD10-I70.0). This is significantly age advanced.     REVIEW OF SYSTEMS:   Constitutional: Denies fevers, chills or abnormal weight loss Eyes: Denies blurriness of vision Ears, nose, mouth, throat, and face: Denies mucositis or  sore throat Respiratory: Denies cough, dyspnea or  wheezes Cardiovascular: Denies palpitation, chest discomfort or lower extremity swelling Skin: Denies abnormal skin rashes Lymphatics: Denies new lymphadenopathy or easy bruising Behavioral/Psych: Mood is stable, no new changes  All other systems were reviewed with the patient and are negative.  I have reviewed the past medical history, past surgical history, social history and family history with the patient and they are unchanged from previous note.  ALLERGIES:  is allergic to codeine; penicillins; and bactrim [sulfamethoxazole-trimethoprim].  MEDICATIONS:  Current Outpatient Medications  Medication Sig Dispense Refill  . acetaminophen (TYLENOL) 650 MG CR tablet Take 1,300 mg by mouth every 8 (eight) hours.    Marland Kitchen dexamethasone (DECADRON) 4 MG tablet Take 1 tablet (4 mg total) by mouth 2 (two) times daily with a meal. 60 tablet 1  . diclofenac sodium (VOLTAREN) 1 % GEL APPLY TO AFFECTED 3 LARGE JOINTS UP TO 3 TIMES DAILY AS NEEDED AS DIRECTED 300 g 1  . diphenoxylate-atropine (LOMOTIL) 2.5-0.025 MG tablet Take 1-2 tablets by mouth 4 (four) times daily as needed for diarrhea or loose stools. 1 to 2 po QID prn diarrhea 90 tablet 1  . gabapentin (NEURONTIN) 300 MG capsule TAKE 1 CAPSULE BY MOUTH THREE TIMES A DAY 270 capsule 11  . HYDROmorphone (DILAUDID) 4 MG tablet Take 1 tablet (4 mg total) by mouth every 4 (four) hours as needed for severe pain. 30 tablet 0  . hydrOXYzine (ATARAX/VISTARIL) 10 MG tablet Take 1 tablet (10 mg total) by mouth 3 (three) times daily as needed for itching. 60 tablet 1  . lidocaine-prilocaine (EMLA) cream Apply to affected area once (Patient taking differently: Apply 1 application topically daily as needed (port). Apply to affected area once) 30 g 3  . loratadine (CLARITIN) 10 MG tablet Take 10 mg by mouth daily after breakfast.    . LORazepam (ATIVAN) 0.5 MG tablet Place 1 tablet (0.5 mg total) under the tongue every 6 (six) hours as needed for anxiety. 60 tablet 1   . metFORMIN (GLUCOPHAGE) 500 MG tablet TAKE 1 TABLET (500 MG TOTAL) BY MOUTH 2 (TWO) TIMES DAILY WITH A MEAL. 60 tablet 1  . naproxen sodium (ALEVE) 220 MG tablet Take 220 mg by mouth as needed.     Marland Kitchen omeprazole (PRILOSEC) 20 MG capsule Take 20 mg by mouth 2 (two) times daily before a meal.     . ondansetron (ZOFRAN) 8 MG tablet Take 1 tablet (8 mg total) by mouth every 8 (eight) hours as needed for refractory nausea / vomiting. Start on day 3 after chemo. 30 tablet 1  . predniSONE (DELTASONE) 5 MG tablet TAKE 1 TABLET BY MOUTH EVERY DAY WITH BREAKFAST 30 tablet 0  . prochlorperazine (COMPAZINE) 10 MG tablet Take 1 tablet (10 mg total) by mouth every 6 (six) hours as needed (Nausea or vomiting). 90 tablet 1  . rivaroxaban (XARELTO) 20 MG TABS tablet Take 1 tablet (20 mg total) by mouth daily with supper. 30 tablet 9  . simethicone (MYLICON) 161 MG chewable tablet Chew 125 mg by mouth every 6 (six) hours as needed for flatulence.    . triamcinolone (NASACORT) 55 MCG/ACT AERO nasal inhaler Place 2 sprays into the nose daily. 1 Inhaler 12   No current facility-administered medications for this visit.    Facility-Administered Medications Ordered in Other Visits  Medication Dose Route Frequency Provider Last Rate Last Dose  . atropine injection 0.5 mg  0.5 mg Intravenous Once PRN Heath Lark, MD      .  dextrose 5 % solution   Intravenous Once Alvy Bimler, Shayle Donahoo, MD      . diphenhydrAMINE (BENADRYL) injection 50 mg  50 mg Intravenous Once Alvy Bimler, Dazani Norby, MD      . famotidine (PEPCID) 20 mg in sodium chloride 0.9 % 100 mL IVPB  20 mg Intravenous Once Alvy Bimler, Tobie Perdue, MD      . fluorouracil (ADRUCIL) 3,950 mg in sodium chloride 0.9 % 71 mL chemo infusion  1,920 mg/m2 (Order-Specific) Intravenous 1 day or 1 dose Aryannah Mohon, MD      . fosaprepitant (EMEND) 150 mg, dexamethasone (DECADRON) 12 mg in sodium chloride 0.9 % 145 mL IVPB   Intravenous Once Alvy Bimler, Rotunda Worden, MD      . irinotecan (CAMPTOSAR) 200 mg in dextrose 5 %  500 mL chemo infusion  100 mg/m2 (Order-Specific) Intravenous Once Alvy Bimler, Deirdra Heumann, MD      . leucovorin 828 mg in dextrose 5 % 250 mL infusion  400 mg/m2 (Order-Specific) Intravenous Once Alvy Bimler, Mercedies Ganesh, MD      . oxaliplatin (ELOXATIN) 70 mg in dextrose 5 % 500 mL chemo infusion  34 mg/m2 (Order-Specific) Intravenous Once Alvy Bimler, Yuto Cajuste, MD      . palonosetron (ALOXI) injection 0.25 mg  0.25 mg Intravenous Once Alvy Bimler, Neda Willenbring, MD        PHYSICAL EXAMINATION: ECOG PERFORMANCE STATUS: 1 - Symptomatic but completely ambulatory  Vitals:   05/23/18 0942  BP: 137/76  Pulse: 70  Resp: 18  Temp: 98.5 F (36.9 C)  SpO2: 98%   Filed Weights   05/23/18 0942  Weight: 219 lb 9.6 oz (99.6 kg)    GENERAL:alert, no distress and comfortable SKIN: skin color, texture, turgor are normal, no rashes or significant lesions EYES: normal, Conjunctiva are pink and non-injected, sclera clear OROPHARYNX:no exudate, no erythema and lips, buccal mucosa, and tongue normal  NECK: supple, thyroid normal size, non-tender, without nodularity LYMPH:  no palpable lymphadenopathy in the cervical, axillary or inguinal LUNGS: clear to auscultation and percussion with normal breathing effort HEART: regular rate & rhythm and no murmurs and no lower extremity edema ABDOMEN:abdomen soft, non-tender and normal bowel sounds Musculoskeletal:no cyanosis of digits and no clubbing  NEURO: alert & oriented x 3 with fluent speech, no focal motor/sensory deficits  LABORATORY DATA:  I have reviewed the data as listed    Component Value Date/Time   NA 140 05/23/2018 0905   K 4.3 05/23/2018 0905   CL 106 05/23/2018 0905   CO2 23 05/23/2018 0905   GLUCOSE 221 (H) 05/23/2018 0905   BUN 15 05/23/2018 0905   CREATININE 0.71 05/23/2018 0905   CREATININE 0.68 07/27/2017 0851   CALCIUM 9.1 05/23/2018 0905   PROT 6.9 05/23/2018 0905   ALBUMIN 3.6 05/23/2018 0905   AST 24 05/23/2018 0905   ALT 49 (H) 05/23/2018 0905   ALKPHOS 188 (H)  05/23/2018 0905   BILITOT 0.2 (L) 05/23/2018 0905   GFRNONAA >60 05/23/2018 0905   GFRNONAA 106 07/27/2017 0851   GFRAA >60 05/23/2018 0905   GFRAA 123 07/27/2017 0851    No results found for: SPEP, UPEP  Lab Results  Component Value Date   WBC 29.1 (H) 05/23/2018   NEUTROABS 23.5 (H) 05/23/2018   HGB 11.1 (L) 05/23/2018   HCT 35.9 (L) 05/23/2018   MCV 100.8 (H) 05/23/2018   PLT 458 (H) 05/23/2018      Chemistry      Component Value Date/Time   NA 140 05/23/2018 0905   K 4.3 05/23/2018 0905  CL 106 05/23/2018 0905   CO2 23 05/23/2018 0905   BUN 15 05/23/2018 0905   CREATININE 0.71 05/23/2018 0905   CREATININE 0.68 07/27/2017 0851      Component Value Date/Time   CALCIUM 9.1 05/23/2018 0905   ALKPHOS 188 (H) 05/23/2018 0905   AST 24 05/23/2018 0905   ALT 49 (H) 05/23/2018 0905   BILITOT 0.2 (L) 05/23/2018 0905      All questions were answered. The patient knows to call the clinic with any problems, questions or concerns. No barriers to learning was detected.  I spent 15 minutes counseling the patient face to face. The total time spent in the appointment was 20 minutes and more than 50% was on counseling and review of test results  Heath Lark, MD 05/23/2018 10:45 AM

## 2018-05-23 NOTE — Assessment & Plan Note (Signed)
Her recent CT scan and tumor marker is stable.  Observe only

## 2018-05-23 NOTE — Assessment & Plan Note (Addendum)
With recent dose adjustment, she tolerated chemotherapy better but still had nausea, neuropathy as well as diarrhea I recommend dexamethasone taper to 1/2 tab once a day for 3 days after chemo We will proceed with treatment without delay and with dose reduction of oxaliplatin as before

## 2018-05-23 NOTE — Telephone Encounter (Signed)
Per 3/31 los Return for scheduling msg sent

## 2018-05-24 ENCOUNTER — Telehealth: Payer: Self-pay

## 2018-05-24 NOTE — Telephone Encounter (Signed)
Called per Dr. Alvy Bimler. Instructed to contact rheumatoid physician to restart medications for rheumatoid arthritis. Wait 30 days before restarting any new medications. She verbalized understanding.

## 2018-05-25 ENCOUNTER — Telehealth: Payer: Self-pay | Admitting: Hematology and Oncology

## 2018-05-25 ENCOUNTER — Inpatient Hospital Stay: Payer: BLUE CROSS/BLUE SHIELD | Attending: Hematology and Oncology

## 2018-05-25 ENCOUNTER — Other Ambulatory Visit: Payer: Self-pay

## 2018-05-25 VITALS — BP 126/91 | HR 88 | Temp 99.0°F | Resp 18

## 2018-05-25 DIAGNOSIS — G620 Drug-induced polyneuropathy: Secondary | ICD-10-CM | POA: Insufficient documentation

## 2018-05-25 DIAGNOSIS — Z5189 Encounter for other specified aftercare: Secondary | ICD-10-CM | POA: Diagnosis not present

## 2018-05-25 DIAGNOSIS — K9089 Other intestinal malabsorption: Secondary | ICD-10-CM | POA: Insufficient documentation

## 2018-05-25 DIAGNOSIS — Z79899 Other long term (current) drug therapy: Secondary | ICD-10-CM | POA: Diagnosis not present

## 2018-05-25 DIAGNOSIS — Z7901 Long term (current) use of anticoagulants: Secondary | ICD-10-CM | POA: Insufficient documentation

## 2018-05-25 DIAGNOSIS — E8941 Symptomatic postprocedural ovarian failure: Secondary | ICD-10-CM | POA: Diagnosis not present

## 2018-05-25 DIAGNOSIS — K87 Disorders of gallbladder, biliary tract and pancreas in diseases classified elsewhere: Secondary | ICD-10-CM | POA: Insufficient documentation

## 2018-05-25 DIAGNOSIS — C7961 Secondary malignant neoplasm of right ovary: Secondary | ICD-10-CM | POA: Insufficient documentation

## 2018-05-25 DIAGNOSIS — M0609 Rheumatoid arthritis without rheumatoid factor, multiple sites: Secondary | ICD-10-CM | POA: Diagnosis not present

## 2018-05-25 DIAGNOSIS — C252 Malignant neoplasm of tail of pancreas: Secondary | ICD-10-CM | POA: Insufficient documentation

## 2018-05-25 DIAGNOSIS — D490 Neoplasm of unspecified behavior of digestive system: Secondary | ICD-10-CM | POA: Diagnosis not present

## 2018-05-25 DIAGNOSIS — C541 Malignant neoplasm of endometrium: Secondary | ICD-10-CM | POA: Diagnosis not present

## 2018-05-25 DIAGNOSIS — I2699 Other pulmonary embolism without acute cor pulmonale: Secondary | ICD-10-CM | POA: Diagnosis not present

## 2018-05-25 DIAGNOSIS — R300 Dysuria: Secondary | ICD-10-CM | POA: Insufficient documentation

## 2018-05-25 DIAGNOSIS — E099 Drug or chemical induced diabetes mellitus without complications: Secondary | ICD-10-CM | POA: Diagnosis not present

## 2018-05-25 DIAGNOSIS — Z1509 Genetic susceptibility to other malignant neoplasm: Secondary | ICD-10-CM | POA: Diagnosis not present

## 2018-05-25 MED ORDER — PEGFILGRASTIM INJECTION 6 MG/0.6ML ~~LOC~~
PREFILLED_SYRINGE | SUBCUTANEOUS | Status: AC
  Filled 2018-05-25: qty 0.6

## 2018-05-25 MED ORDER — HEPARIN SOD (PORK) LOCK FLUSH 100 UNIT/ML IV SOLN
500.0000 [IU] | Freq: Once | INTRAVENOUS | Status: AC | PRN
  Administered 2018-05-25: 500 [IU]
  Filled 2018-05-25: qty 5

## 2018-05-25 MED ORDER — HEPARIN SOD (PORK) LOCK FLUSH 100 UNIT/ML IV SOLN
500.0000 [IU] | Freq: Once | INTRAVENOUS | Status: DC
  Filled 2018-05-25: qty 5

## 2018-05-25 MED ORDER — SODIUM CHLORIDE 0.9% FLUSH
10.0000 mL | INTRAVENOUS | Status: DC | PRN
  Administered 2018-05-25: 10 mL
  Filled 2018-05-25: qty 10

## 2018-05-25 MED ORDER — SODIUM CHLORIDE 0.9% FLUSH
10.0000 mL | Freq: Once | INTRAVENOUS | Status: DC
  Filled 2018-05-25: qty 10

## 2018-05-25 MED ORDER — PEGFILGRASTIM INJECTION 6 MG/0.6ML ~~LOC~~: 6.0000 mg | PREFILLED_SYRINGE | Freq: Once | SUBCUTANEOUS | Status: DC

## 2018-05-25 MED ORDER — PEGFILGRASTIM INJECTION 6 MG/0.6ML ~~LOC~~
6.0000 mg | PREFILLED_SYRINGE | Freq: Once | SUBCUTANEOUS | Status: AC
  Administered 2018-05-25: 16:00:00 6 mg via SUBCUTANEOUS

## 2018-05-25 NOTE — Telephone Encounter (Signed)
Scheduled per sch msg. Called and spoke with patient. Confirmed date and time ° °

## 2018-05-25 NOTE — Patient Instructions (Signed)
Pegfilgrastim injection  What is this medicine?  PEGFILGRASTIM (PEG fil gra stim) is a long-acting granulocyte colony-stimulating factor that stimulates the growth of neutrophils, a type of white blood cell important in the body's fight against infection. It is used to reduce the incidence of fever and infection in patients with certain types of cancer who are receiving chemotherapy that affects the bone marrow, and to increase survival after being exposed to high doses of radiation.  This medicine may be used for other purposes; ask your health care provider or pharmacist if you have questions.  COMMON BRAND NAME(S): Fulphila, Neulasta, UDENYCA  What should I tell my health care provider before I take this medicine?  They need to know if you have any of these conditions:  -kidney disease  -latex allergy  -ongoing radiation therapy  -sickle cell disease  -skin reactions to acrylic adhesives (On-Body Injector only)  -an unusual or allergic reaction to pegfilgrastim, filgrastim, other medicines, foods, dyes, or preservatives  -pregnant or trying to get pregnant  -breast-feeding  How should I use this medicine?  This medicine is for injection under the skin. If you get this medicine at home, you will be taught how to prepare and give the pre-filled syringe or how to use the On-body Injector. Refer to the patient Instructions for Use for detailed instructions. Use exactly as directed. Tell your healthcare provider immediately if you suspect that the On-body Injector may not have performed as intended or if you suspect the use of the On-body Injector resulted in a missed or partial dose.  It is important that you put your used needles and syringes in a special sharps container. Do not put them in a trash can. If you do not have a sharps container, call your pharmacist or healthcare provider to get one.  Talk to your pediatrician regarding the use of this medicine in children. While this drug may be prescribed for  selected conditions, precautions do apply.  Overdosage: If you think you have taken too much of this medicine contact a poison control center or emergency room at once.  NOTE: This medicine is only for you. Do not share this medicine with others.  What if I miss a dose?  It is important not to miss your dose. Call your doctor or health care professional if you miss your dose. If you miss a dose due to an On-body Injector failure or leakage, a new dose should be administered as soon as possible using a single prefilled syringe for manual use.  What may interact with this medicine?  Interactions have not been studied.  Give your health care provider a list of all the medicines, herbs, non-prescription drugs, or dietary supplements you use. Also tell them if you smoke, drink alcohol, or use illegal drugs. Some items may interact with your medicine.  This list may not describe all possible interactions. Give your health care provider a list of all the medicines, herbs, non-prescription drugs, or dietary supplements you use. Also tell them if you smoke, drink alcohol, or use illegal drugs. Some items may interact with your medicine.  What should I watch for while using this medicine?  You may need blood work done while you are taking this medicine.  If you are going to need a MRI, CT scan, or other procedure, tell your doctor that you are using this medicine (On-Body Injector only).  What side effects may I notice from receiving this medicine?  Side effects that you should report to   your doctor or health care professional as soon as possible:  -allergic reactions like skin rash, itching or hives, swelling of the face, lips, or tongue  -back pain  -dizziness  -fever  -pain, redness, or irritation at site where injected  -pinpoint red spots on the skin  -red or dark-brown urine  -shortness of breath or breathing problems  -stomach or side pain, or pain at the shoulder  -swelling  -tiredness  -trouble passing urine or  change in the amount of urine  Side effects that usually do not require medical attention (report to your doctor or health care professional if they continue or are bothersome):  -bone pain  -muscle pain  This list may not describe all possible side effects. Call your doctor for medical advice about side effects. You may report side effects to FDA at 1-800-FDA-1088.  Where should I keep my medicine?  Keep out of the reach of children.  If you are using this medicine at home, you will be instructed on how to store it. Throw away any unused medicine after the expiration date on the label.  NOTE: This sheet is a summary. It may not cover all possible information. If you have questions about this medicine, talk to your doctor, pharmacist, or health care provider.   2019 Elsevier/Gold Standard (2017-05-16 16:57:08)

## 2018-06-02 ENCOUNTER — Other Ambulatory Visit: Payer: Self-pay | Admitting: Hematology and Oncology

## 2018-06-06 ENCOUNTER — Encounter: Payer: Self-pay | Admitting: Rheumatology

## 2018-06-06 ENCOUNTER — Other Ambulatory Visit: Payer: Self-pay | Admitting: Hematology and Oncology

## 2018-06-06 DIAGNOSIS — C252 Malignant neoplasm of tail of pancreas: Secondary | ICD-10-CM

## 2018-06-07 ENCOUNTER — Encounter: Payer: Self-pay | Admitting: Rheumatology

## 2018-06-07 ENCOUNTER — Encounter: Payer: Self-pay | Admitting: Hematology and Oncology

## 2018-06-07 ENCOUNTER — Telehealth: Payer: Self-pay | Admitting: Rheumatology

## 2018-06-07 NOTE — Telephone Encounter (Signed)
Please notify patient that Dr. Simeon Craft such responded and she is planning to repeat her labs at the end of the month.  After that she should be able to start [place] in the first week of the May.  I would wait until the lab work is done.

## 2018-06-07 NOTE — Telephone Encounter (Signed)
Advised patient of information below. Patient verbalized understanding.  °

## 2018-06-07 NOTE — Telephone Encounter (Signed)
Dear Dr. Alvy Bimler, I received a message from Dana Hicks that you approved her to go back on Orencia and Methotrexate.I need your approval. Her WBC is high which probably is due to the use of Prednisone. I may not be able to use MTX at this time due to elevated LFTs. Please, let me know if we can restart Orencia and add MTX as LFTs improve. Thank you, Bo Merino

## 2018-06-11 ENCOUNTER — Other Ambulatory Visit: Payer: Self-pay | Admitting: Hematology and Oncology

## 2018-06-13 ENCOUNTER — Other Ambulatory Visit: Payer: Self-pay | Admitting: Hematology and Oncology

## 2018-06-20 ENCOUNTER — Encounter: Payer: Self-pay | Admitting: Hematology and Oncology

## 2018-06-20 ENCOUNTER — Inpatient Hospital Stay: Payer: BLUE CROSS/BLUE SHIELD | Admitting: Hematology and Oncology

## 2018-06-20 ENCOUNTER — Encounter: Payer: Self-pay | Admitting: Rheumatology

## 2018-06-20 ENCOUNTER — Other Ambulatory Visit: Payer: Self-pay

## 2018-06-20 ENCOUNTER — Other Ambulatory Visit: Payer: Self-pay | Admitting: Physician Assistant

## 2018-06-20 ENCOUNTER — Inpatient Hospital Stay: Payer: BLUE CROSS/BLUE SHIELD

## 2018-06-20 VITALS — BP 143/105 | HR 98 | Temp 98.1°F | Resp 18 | Ht 62.0 in | Wt 232.2 lb

## 2018-06-20 DIAGNOSIS — R300 Dysuria: Secondary | ICD-10-CM

## 2018-06-20 DIAGNOSIS — G620 Drug-induced polyneuropathy: Secondary | ICD-10-CM | POA: Diagnosis not present

## 2018-06-20 DIAGNOSIS — E8941 Symptomatic postprocedural ovarian failure: Secondary | ICD-10-CM | POA: Diagnosis not present

## 2018-06-20 DIAGNOSIS — C252 Malignant neoplasm of tail of pancreas: Secondary | ICD-10-CM

## 2018-06-20 DIAGNOSIS — C541 Malignant neoplasm of endometrium: Secondary | ICD-10-CM

## 2018-06-20 DIAGNOSIS — K87 Disorders of gallbladder, biliary tract and pancreas in diseases classified elsewhere: Secondary | ICD-10-CM

## 2018-06-20 DIAGNOSIS — M0609 Rheumatoid arthritis without rheumatoid factor, multiple sites: Secondary | ICD-10-CM

## 2018-06-20 DIAGNOSIS — T451X5A Adverse effect of antineoplastic and immunosuppressive drugs, initial encounter: Secondary | ICD-10-CM

## 2018-06-20 DIAGNOSIS — Z1509 Genetic susceptibility to other malignant neoplasm: Secondary | ICD-10-CM | POA: Diagnosis not present

## 2018-06-20 DIAGNOSIS — K9089 Other intestinal malabsorption: Secondary | ICD-10-CM

## 2018-06-20 DIAGNOSIS — Z5189 Encounter for other specified aftercare: Secondary | ICD-10-CM | POA: Diagnosis not present

## 2018-06-20 DIAGNOSIS — E099 Drug or chemical induced diabetes mellitus without complications: Secondary | ICD-10-CM

## 2018-06-20 DIAGNOSIS — C7961 Secondary malignant neoplasm of right ovary: Secondary | ICD-10-CM

## 2018-06-20 DIAGNOSIS — D490 Neoplasm of unspecified behavior of digestive system: Secondary | ICD-10-CM

## 2018-06-20 DIAGNOSIS — I2699 Other pulmonary embolism without acute cor pulmonale: Secondary | ICD-10-CM | POA: Diagnosis not present

## 2018-06-20 DIAGNOSIS — K869 Disease of pancreas, unspecified: Secondary | ICD-10-CM

## 2018-06-20 DIAGNOSIS — K909 Intestinal malabsorption, unspecified: Secondary | ICD-10-CM

## 2018-06-20 DIAGNOSIS — Z7901 Long term (current) use of anticoagulants: Secondary | ICD-10-CM | POA: Diagnosis not present

## 2018-06-20 DIAGNOSIS — Z79899 Other long term (current) drug therapy: Secondary | ICD-10-CM | POA: Diagnosis not present

## 2018-06-20 DIAGNOSIS — T380X5A Adverse effect of glucocorticoids and synthetic analogues, initial encounter: Secondary | ICD-10-CM

## 2018-06-20 LAB — URINALYSIS, COMPLETE (UACMP) WITH MICROSCOPIC
Bilirubin Urine: NEGATIVE
Glucose, UA: NEGATIVE mg/dL
Hgb urine dipstick: NEGATIVE
Ketones, ur: 5 mg/dL — AB
Nitrite: NEGATIVE
Protein, ur: 30 mg/dL — AB
Specific Gravity, Urine: 1.029 (ref 1.005–1.030)
pH: 5 (ref 5.0–8.0)

## 2018-06-20 LAB — CBC WITH DIFFERENTIAL/PLATELET
Abs Immature Granulocytes: 0.07 10*3/uL (ref 0.00–0.07)
Basophils Absolute: 0.1 10*3/uL (ref 0.0–0.1)
Basophils Relative: 1 %
Eosinophils Absolute: 0.1 10*3/uL (ref 0.0–0.5)
Eosinophils Relative: 0 %
HCT: 38.8 % (ref 36.0–46.0)
Hemoglobin: 11.8 g/dL — ABNORMAL LOW (ref 12.0–15.0)
Immature Granulocytes: 0 %
Lymphocytes Relative: 18 %
Lymphs Abs: 2.8 10*3/uL (ref 0.7–4.0)
MCH: 31.2 pg (ref 26.0–34.0)
MCHC: 30.4 g/dL (ref 30.0–36.0)
MCV: 102.6 fL — ABNORMAL HIGH (ref 80.0–100.0)
Monocytes Absolute: 1.5 10*3/uL — ABNORMAL HIGH (ref 0.1–1.0)
Monocytes Relative: 9 %
Neutro Abs: 11.2 10*3/uL — ABNORMAL HIGH (ref 1.7–7.7)
Neutrophils Relative %: 72 %
Platelets: 516 10*3/uL — ABNORMAL HIGH (ref 150–400)
RBC: 3.78 MIL/uL — ABNORMAL LOW (ref 3.87–5.11)
RDW: 17.3 % — ABNORMAL HIGH (ref 11.5–15.5)
WBC: 15.7 10*3/uL — ABNORMAL HIGH (ref 4.0–10.5)
nRBC: 0 % (ref 0.0–0.2)

## 2018-06-20 LAB — COMPREHENSIVE METABOLIC PANEL
ALT: 51 U/L — ABNORMAL HIGH (ref 0–44)
AST: 25 U/L (ref 15–41)
Albumin: 3.4 g/dL — ABNORMAL LOW (ref 3.5–5.0)
Alkaline Phosphatase: 120 U/L (ref 38–126)
Anion gap: 12 (ref 5–15)
BUN: 10 mg/dL (ref 6–20)
CO2: 26 mmol/L (ref 22–32)
Calcium: 8.9 mg/dL (ref 8.9–10.3)
Chloride: 104 mmol/L (ref 98–111)
Creatinine, Ser: 0.72 mg/dL (ref 0.44–1.00)
GFR calc Af Amer: >60 mL/min (ref >60)
GFR calc non Af Amer: >60 mL/min (ref >60)
Glucose, Bld: 123 mg/dL — ABNORMAL HIGH (ref 70–99)
Potassium: 3.6 mmol/L (ref 3.5–5.1)
Sodium: 142 mmol/L (ref 135–145)
Total Bilirubin: <0.2 mg/dL — ABNORMAL LOW (ref 0.3–1.2)
Total Protein: 6.6 g/dL (ref 6.5–8.1)

## 2018-06-20 LAB — MAGNESIUM: Magnesium: 1.8 mg/dL (ref 1.7–2.4)

## 2018-06-20 MED ORDER — CITALOPRAM HYDROBROMIDE 20 MG PO TABS: 20.0000 mg | ORAL_TABLET | Freq: Every day | ORAL | 11 refills | Status: DC

## 2018-06-20 NOTE — Assessment & Plan Note (Signed)
We discussed the risk and benefit of pursuing stool study to look for pancreatic malabsorption She is interested to proceed with this I will order the test for her and will call her with test results

## 2018-06-20 NOTE — Assessment & Plan Note (Signed)
Her blood sugar control has improved dramatically with the addition of metformin and dexamethasone taper We will continue to same for now She will continue on metformin and slow prednisone taper

## 2018-06-20 NOTE — Progress Notes (Signed)
[place] OFFICE PROGRESS NOTE  Patient Care Team: [name], MD as PCP - General (Family Medicine)  ASSESSMENT & PLAN:  Pancreatic cancer [facility]) She has completed chemotherapy Tumor marker is pending She is due for CT imaging next month I will schedule the imaging study and review test result with her She is recovering slowly from recent side effects of treatment  Endometrial cancer [facility]) She has completed recent adjuvant treatment Tumor marker is stable  Secondary malignant neoplasm of right ovary [facility]) Recent CT imaging show no evidence of disease Tumor marker is stable We will continue monitoring of Ca1 25  Hot flashes due to surgical menopause She continues to have significant hot flashes and mild depression from recent treatment We discussed the risk and benefit of citalopram and she agreed to try  Pulmonary embolism [facility]) She has incidental finding of pulmonary embolism.  We discussed the risk, benefits, side effects of anticoagulation therapy. The plan will be to continue on treatment for minimum 6 months to a year.   Steroid-induced diabetes ([place]) Her blood sugar control has improved dramatically with the addition of metformin and dexamethasone taper We will continue to same for now She will continue on metformin and slow prednisone taper  Dysuria She has mild symptom of dysuria and urinary frequency I recommend urinalysis and urine culture She agreed to proceed  Peripheral neuropathy due to chemotherapy [facility]) Peripheral neuropathy is stable She will continue on gabapentin  Rheumatoid arthritis of multiple sites with negative rheumatoid factor ([place]) She has completed treatment and her blood counts are recovered There is no contraindication for her to proceed with disease modifying treatment as directed by her rheumatologist We discussed slow prednisone taper  Malabsorption due to disorder of pancreas We discussed the risk and benefit of  pursuing stool study to look for pancreatic malabsorption She is interested to proceed with this I will order the test for her and will call her with test results  Lynch syndrome She is confirmed to have Lynch syndrome She needs to follow with gastroenterologist for screening colonoscopy Her bowel habits has improved since discontinuation of treatment   Orders Placed This Encounter  Procedures  . Urine Culture    Standing Status:   Future    Number of Occurrences:   1    Standing Expiration Date:   07/25/2019  . CT ABDOMEN PELVIS W CONTRAST    Standing Status:   Future    Standing Expiration Date:   06/20/2019    Order Specific Question:   If indicated for the ordered procedure, I authorize the administration of contrast media per Radiology protocol    Answer:   Yes    Order Specific Question:   Preferred imaging location?    Answer:   [facility]    Order Specific Question:   Radiology Contrast Protocol - do NOT remove file path    Answer:   _[email or web address]    Order Specific Question:   Is patient pregnant?    Answer:   No  . Urinalysis, Complete w Microscopic    Standing Status:   Future    Number of Occurrences:   1    Standing Expiration Date:   06/20/2019  . Pancreatic Elastase, Fecal    Standing Status:   Future    Standing Expiration Date:   06/20/2019    INTERVAL HISTORY: Please see below for problem oriented charting. She returns for further follow-up Since last time I saw her, she is mildly  improved She fell recently due to accident Her bowel habits has improved although she continues to have chronic diarrhea She attempted to take Creon samples from her surgeon and that seems to help She denies abdominal pain Peripheral neuropathy is stable She complained of worsening joint pain since discontinuation of chemotherapy Her blood sugar control has improved She complained of some mild dysuria and frequency but denies hematuria She  has significant hot flashes and mild mood swings and is wondering whether she would would be beneficial to start something for hot flashes and her mood swings She continues taking gabapentin for neuropathy  SUMMARY OF ONCOLOGIC HISTORY: Oncology History   MSI positive  Endometrial :endometrioid Ovarian: Endometrioid  Lynch syndrome due to MSH2 c.[id number]      Endometrial cancer (HCC)   08/18/2017 Imaging    Ct scan abdomen and pelvis 1. Mixed attenuation mass emanates from the right adnexa measuring 10.8 x 8.0 cm very suspicious for right ovarian carcinoma. 2. Abnormality of the tail of the pancreas may be due to mild pancreatitis and small pseudocyst formation, but neoplasm cannot be excluded. 3. Small amount of ascites within abdomen and pelvis. 4. Small nonobstructing renal calculi bilaterally.     08/30/2017 Pathology Results    1. Uterus and cervix, with left fallopian tube - ENDOMETRIOID ADENOCARCINOMA, FIGO GRADE I, ARISING IN A BACKGROUND OF DIFFUSE COMPLEX ATYPICAL HYPERPLASIA. - CARCINOMA INVADES FOR OF DEPTH OF 0.2 CM WHERE THICKNESS OF MYOMETRIAL WALL IS 2.1 CM. - ALL RESECTION MARGINS ARE NEGATIVE FOR CARCINOMA. - NEGATIVE FOR LYMPHOVASCULAR OR PERINEURAL INVASION. - CERVICAL STROMA IS NOT INVOLVED. - SEE ONCOLOGY TABLE. - SEE NOTE 2. Ovary and fallopian tube, right - PRIMARY OVARIAN ENDOMETRIOID ADENOCARCINOMA, FIGO GRADE II, 12 CM. - THE OVARIAN SURFACE IS FOCALLY INVOLVED BY CARCINOMA. - NEGATIVE FOR LYMPHOVASCULAR INVASION. - BENIGN UNREMARKABLE FALLOPIAN TUBE, NEGATIVE FOR CARCINOMA. - SEE ONCOLOGY TABLE. - SEE NOTE 3. Cul-de-sac biopsy - METASTATIC ADENOCARCINOMA, MOST CONSISTENT WITH PRIMARY OVARIAN ENDOMETRIOID ADENOCARCINOMA. 4. Ovary, left - BENIGN UNREMARKABLE OVARY, NEGATIVE FOR MALIGNANCY. Microscopic Comment 1. UTERUS, CARCINOMA OR CARCINOSARCOMA Procedure: Total hysterectomy with bilateral salpingo-oophorectomy. Histologic type: Endometrioid  adenocarcinoma. Histologic Grade: FIGO Grade I Myometrial invasion: Depth of invasion: 2 mm Myometrial thickness: 21 mm Uterine Serosa Involvement: Not identified Cervical stromal involvement: Not identified Extent of involvement of other organs: Not applicable Lymphovascular invasion: Not identified Regional Lymph Nodes: Examined: 0 Sentinel 0 Non-sentinel 0 Total Tumor block for ancillary studies: 1H MMR / MSI testing: Pending Pathologic Stage Classification (pTNM, AJCC 8th edition): pT1a, pNX (v4.1.0.0) 2. OVARY or FALLOPIAN TUBE or PRIMARY PERITONEUM: Procedure: Salpingo-oophorectomy Specimen Integrity: Intact Tumor Site: Right ovary Ovarian Surface Involvement (required only if applicable): Focally involved by carcinoma Fallopian Tube Surface Involvement (required only if applicable): Not identified Tumor Size: 12 cm Histologic Type: Endometrioid adenocarcinoma Histologic Grade: Grade II Implants (required for advanced stage serous/seromucinous borderline tumors only): Not applicable Other Tissue/ Organ Involvement: Cul de sac biopsy involved by tumor Largest Extrapelvic Peritoneal Focus (required only if applicable): Not applicable Peritoneal/Ascitic Fluid: Negative for carcinoma (case # [id number]) Treatment Effect (required only for high-grade serous carcinomas): Not applicable Regional Lymph Nodes: No lymph nodes submitted or found Number of Lymph Nodes Examined: 0 Pathologic Stage Classification (pTNM, AJCC 8th Edition): pT2b, pN0 Representative Tumor Block: 2B and 2E 1. Molecular study for microsatellite instability and immunohistochemical stains for MMR-related proteins are pending and will be reported in an addendum. 2. Immunohistochemical stain show that the ovarian tumor is positive for CK7   and PAX8 (both diffuse), CDX2 (patchy and weak); and negative for CK20. This immunoprofile is consistent with the above diagnosis. Dr. Lyndon Code has reviewed this case and concurs  with the above diagnosis. Molecular study for microsatellite instability and immunohistochemical stains for MMR-related proteins are pending and will be reported in an addendum    08/30/2017 Genetic Testing    Patient has genetic testing done for MSI  Results revealed patient has the following mutation(s): loss of [facility] 2    08/30/2017 Surgery    Surgeon: Mart Piggs, MD Pre-operative Diagnosis:  1. Adnexal mass 2. Abnormal uterine bleeding 3. H/o Cecal CA  Post-operative Diagnosis:  1. Adhesive disease post colon resection 2. Endometrial cancer NOS 3. Adenocarcinoma unknown origin, right ovary, suspicious for GI primary  Operation:  1. Lysis of adhesions ~30 minutes 2. Robotic-assisted laparoscopic total hysterectomy with right salpingo-oophorectomy and left salpingectomy 3. Left oophorectomy (RA-laparoscopic) 4. Pelvic washings  Findings: Adhesions of omentum to anterior abdominal wall. Enlarged cystic right ovary ~10cm. Uterus had small nodules on serosa near where right adnexa was intimate with the surface. No obvious intraoperative rupture of cyst, although in 2 areas the wall was thin and one of these areas had some bleeding. Slight scarring of left bladder dome to LUS/cervix. Uterus on frozen section c/w hyperplasia and a small focus of endometrial CA - no myo invasion, <2cm in size. Frozen section on the right adnexa was carcinoma, met from colon or possibly Gyn, favor GI primary, defer to permanent. Left ovary was WNL.      09/15/2017 Cancer Staging    Staging form: Corpus Uteri - Carcinoma and Carcinosarcoma, AJCC 8th Edition - Pathologic: FIGO Stage IA (pT1a, pN0, cM0) - Signed by Heath Lark, MD on 09/15/2017    09/26/2017 Procedure    Successful placement of a right internal jugular approach power injectable Port-A-Cath. The catheter is ready for immediate use.    11/07/2017 Imaging    1. Since 08/18/2017, similar to slight decrease in size of a pancreatic body/tail  junction lesion. Cross modality comparison relative to 09/16/2017 MRI is also grossly similar. 2. No evidence of metastatic disease. 3. Aortic Atherosclerosis (ICD10-I70.0).  4. Left nephrolithiasis.    11/18/2017 Genetic Testing    MSH2 c.2237dupT pathogenic mutation identified in the CancerNext panel.  The CancerNext gene panel offered by [employer] includes sequencing and rearrangement analysis for the following 34 genes:   APC, ATM, BARD1, BMPR1A, BRCA1, BRCA2, BRIP1, CDH1, CDK4, CDKN2A, CHEK2, DICER1, HOXB13, EPCAM, GREM1, MLH1, MRE11A, MSH2, MSH6, MUTYH, NBN, NF1, PALB2, PMS2, POLD1, POLE, PTEN, RAD50, RAD51C, RAD51D, SMAD4, SMARCA4, STK11, and TP53.  The report date is November 18, 2017.  MSH2 [id number] pathogenic mutation identified on somatic testing.  These results are consistent with a diagnosis of Lynch syndrome.     Secondary malignant neoplasm of right ovary ([place])   08/23/2017 Tumor Marker    Patient's tumor was tested for the following markers: CA-125 Results of the tumor marker test revealed 139.8    08/31/2017 Initial Diagnosis    Secondary malignant neoplasm of right ovary ([place])    09/15/2017 Cancer Staging    Staging form: Ovary, Fallopian Tube, and Primary Peritoneal Carcinoma, AJCC 8th Edition - Pathologic: Stage IIB (pT2b, pN0, cM0) - Signed by Heath Lark, MD on 09/15/2017    09/27/2017 Imaging    No evidence of metastatic disease or other acute findings within the thorax.  4 cm low-attenuation mass in pancreatic tail, highly suspicious for pancreatic carcinoma. This  is caused splenic vein thrombosis, with new venous collaterals in the left upper quadrant. Consider endoscopic ultrasound with FNA for tissue diagnosis.  Stable benign hepatic hemangioma.     09/27/2017 Tumor Marker    Patient's tumor was tested for the following markers: CA-125 Results of the tumor marker test revealed 39.4    11/02/2017 Tumor Marker    Patient's tumor was tested for  the following markers: CA-125 Results of the tumor marker test revealed 19    11/07/2017 Tumor Marker    Patient's tumor was tested for the following markers: CA-125 Results of the tumor marker test revealed 18.3    11/18/2017 Genetic Testing    MSH2 c.2237dupT pathogenic mutation identified in the CancerNext panel.  The CancerNext gene panel offered by [employer] includes sequencing and rearrangement analysis for the following 34 genes:   APC, ATM, BARD1, BMPR1A, BRCA1, BRCA2, BRIP1, CDH1, CDK4, CDKN2A, CHEK2, DICER1, HOXB13, EPCAM, GREM1, MLH1, MRE11A, MSH2, MSH6, MUTYH, NBN, NF1, PALB2, PMS2, POLD1, POLE, PTEN, RAD50, RAD51C, RAD51D, SMAD4, SMARCA4, STK11, and TP53.  The report date is November 18, 2017.  MSH2 [id number] pathogenic mutation identified on somatic testing.  These results are consistent with a diagnosis of Lynch syndrome.    12/15/2017 Tumor Marker    Patient's tumor was tested for the following markers: CA-125 Results of the tumor marker test revealed 57.3    01/16/2018 Tumor Marker    Patient's tumor was tested for the following markers: CA-125 Results of the tumor marker test revealed 24.2    04/10/2018 Tumor Marker    Patient's tumor was tested for the following markers: CA-125 Results of the tumor marker test revealed 18.2     Pancreatic cancer ([place])   10/13/2017 Pathology Results    Pancreas tail mass, endoscopic ultrasound-guided, fine needle aspiration II (smears and cell block): Adenocarcinoma    11/18/2017 Genetic Testing    MSH2 c.2237dupT pathogenic mutation identified in the CancerNext panel.  The CancerNext gene panel offered by [employer] includes sequencing and rearrangement analysis for the following 34 genes:   APC, ATM, BARD1, BMPR1A, BRCA1, BRCA2, BRIP1, CDH1, CDK4, CDKN2A, CHEK2, DICER1, HOXB13, EPCAM, GREM1, MLH1, MRE11A, MSH2, MSH6, MUTYH, NBN, NF1, PALB2, PMS2, POLD1, POLE, PTEN, RAD50, RAD51C, RAD51D, SMAD4, SMARCA4,  STK11, and TP53.  The report date is November 18, 2017.  MSH2 [id number] pathogenic mutation identified on somatic testing.  These results are consistent with a diagnosis of Lynch syndrome.    11/24/2017 Pathology Results    A. "TAIL OF PANCREAS AND SPLEEN", DISTAL PANCREATECTOMY AND SPLENECTOMY: Invasive ductal adenocarcinoma, moderately to poorly differentiated with focal signet ring cell features, of pancreas (distal).   The carcinoma is 2.5 cm in greatest dimension grossly.   Treatment effects present in the form of fibrosis (50%). No lymphovascular or definite perineural invasion identified. All surgical margins are negative for tumor or high-grade dysplasia. Adjacent mucinous neoplasm, most consistent with Intraductal papillary mucinous neoplasm (IPMN) with low-grade dysplasia.   Uninvolved pancreas show atrophy and focal acute inflammation.   Twelve benign lymph nodes (0/12). Spleen with no significant histopathologic abnormalities.  PROCEDURE: distal pancreatectomy and splenectomy TUMOR SITE: distal pancreas TUMOR SIZE:  GREATEST DIMENSION: 2.5 cm  ADDITIONAL DIMENSIONS:  x  cm HISTOLOGIC TYPE: ductal adenocarcinoma HISTOLOGIC GRADE: grade 3 TUMOR EXTENSION: peripancreatic soft tissue MARGINS: negative for tumor TREATMENT EFFECT: treatment effects present in the form of fibrosis (50%). LYMPHOVASCULAR INVASION: not identified PERINEURAL INVASION: no definite evidence REGIONAL LYMPH NODES:   NUMBER OF LYMPH NODES  INVOLVED: 0   NUMBER OF LYMPH NODES EXAMINED: 12 PATHOLOGIC STAGE CLASSIFICATION (pTNM, AJCC 8th Ed): ypT2, ypN0 DISTANT METASTASIS (pM): pMx ADDITIONAL PATHOLOGIC FINDINGS: mucinous neoplasm, most consistent with intraductal papillary mucinous neoplasm (IPMN) with low-grade dysplasia, is identified adjacent to the main tumor.    11/24/2017 Cancer Staging    Staging form: Exocrine Pancreas,  AJCC 8th Edition - Pathologic stage from 11/24/2017: Stage IB (pT2, pN0, cM0) - Signed by Feng, Yan, MD on 01/03/2018    11/25/2017 Surgery    She had surgery at [facility] 1. Exploratory Laparotomy 2. Distal Pancreatectomy and Splenectomy 3. Intraoperative Ultrasound 4. Open Cholecystectomy     12/15/2017 Cancer Staging    Staging form: Exocrine Pancreas, AJCC 8th Edition - Clinical: Stage IB (cT2, cN0, cM0) - Signed by , , MD on 12/15/2017    12/28/2017 Imaging    12/28/2017 CT Abdomen  IMPRESSION: 1. Postoperative findings from recent partial pancreatectomy including a 21 cubic cm fluid collection along the pancreatic resection margin which could represent early pseudocyst. 2. Nodularity along the lateral limb of the left adrenal gland could also be postoperative but merit surveillance, as the pancreatic lesion was in close proximity to this adrenal gland on the prior CT of 11/07/2017. 3. Asymmetric fullness inferiorly in the left breast. The patient has a history of prior left breast procedures, correlation with mammography is recommended. 4. Other imaging findings of potential clinical significance: Stable hemangioma in the left hepatic lobe. Splenectomy. Aortic Atherosclerosis (ICD10-I70.0). Right hemicolectomy. Small focus of fat necrosis in the right anterior abdominal wall subcutaneous tissues near the laparotomy site. Bilateral nonobstructive nephrolithiasis.    01/02/2018 Tumor Marker    Patient's tumor was tested for the following markers: CA-19-9 Results of the tumor marker test revealed 5    01/03/2018 -  Chemotherapy    She received modified dose FOLFIRINOX    04/10/2018 Imaging    1. Distal pancreatectomy, without findings of recurrent or metastatic disease. 2. Incompletely imaged hypoenhancement within lower lobe right pulmonary artery branch is likely chronic (but interval since 10/09/2017) pulmonary embolism. Dedicated CTA could further evaluate. 3. Decrease  in size of a peripancreatic complex fluid collection anteriorly, likely a resolving pseudocyst. 4. Decreased size of minimal fluid versus a borderline sized node in the gastrohepatic ligament. Recommend attention on follow-up. 5. Hepatic steatosis with a segment 4 hemangioma. 6. Aortic Atherosclerosis (ICD10-I70.0). This is significantly age advanced.     REVIEW OF SYSTEMS:   Constitutional: Denies fevers, chills or abnormal weight loss Eyes: Denies blurriness of vision Ears, nose, mouth, throat, and face: Denies mucositis or sore throat Respiratory: Denies cough, dyspnea or wheezes Cardiovascular: Denies palpitation, chest discomfort or lower extremity swelling Skin: Denies abnormal skin rashes Lymphatics: Denies new lymphadenopathy or easy bruising Neurological:Denies numbness, tingling or new weaknesses Behavioral/Psych: Mood is stable, no new changes  All other systems were reviewed with the patient and are negative.  I have reviewed the past medical history, past surgical history, social history and family history with the patient and they are unchanged from previous note.  ALLERGIES:  is allergic to codeine; penicillins; and bactrim [sulfamethoxazole-trimethoprim].  MEDICATIONS:  Current Outpatient Medications  Medication Sig Dispense Refill  . acetaminophen (TYLENOL) 650 MG CR tablet Take 1,300 mg by mouth every 8 (eight) hours.    . citalopram (CELEXA) 20 MG tablet Take 1 tablet (20 mg total) by mouth daily. 30 tablet 11  . diclofenac sodium (VOLTAREN) 1 % GEL APPLY TO AFFECTED 3 LARGE JOINTS UP   TO 3 TIMES DAILY AS NEEDED AS DIRECTED 300 g 1  . diphenoxylate-atropine (LOMOTIL) 2.5-0.025 MG tablet Take 1-2 tablets by mouth 4 (four) times daily as needed for diarrhea or loose stools. 1 to 2 po QID prn diarrhea 90 tablet 1  . gabapentin (NEURONTIN) 300 MG capsule TAKE 1 CAPSULE BY MOUTH THREE TIMES A DAY 270 capsule 11  . hydrOXYzine (ATARAX/VISTARIL) 10 MG tablet Take 1 tablet  (10 mg total) by mouth 3 (three) times daily as needed for itching. 60 tablet 1  . lidocaine-prilocaine (EMLA) cream Apply to affected area once (Patient taking differently: Apply 1 application topically daily as needed (port). Apply to affected area once) 30 g 3  . loratadine (CLARITIN) 10 MG tablet Take 10 mg by mouth daily after breakfast.    . LORazepam (ATIVAN) 0.5 MG tablet Place 1 tablet (0.5 mg total) under the tongue every 6 (six) hours as needed for anxiety. 60 tablet 1  . metFORMIN (GLUCOPHAGE) 500 MG tablet TAKE 1 TABLET (500 MG TOTAL) BY MOUTH 2 (TWO) TIMES DAILY WITH A MEAL. 60 tablet 1  . naproxen sodium (ALEVE) 220 MG tablet Take 220 mg by mouth as needed.     . omeprazole (PRILOSEC) 20 MG capsule Take 20 mg by mouth 2 (two) times daily before a meal.     . ondansetron (ZOFRAN) 8 MG tablet Take 1 tablet (8 mg total) by mouth every 8 (eight) hours as needed for refractory nausea / vomiting. Start on day 3 after chemo. 30 tablet 1  . predniSONE (DELTASONE) 5 MG tablet TAKE 1 TABLET BY MOUTH EVERY DAY WITH BREAKFAST 30 tablet 0  . prochlorperazine (COMPAZINE) 10 MG tablet Take 1 tablet (10 mg total) by mouth every 6 (six) hours as needed (Nausea or vomiting). 90 tablet 1  . rivaroxaban (XARELTO) 20 MG TABS tablet Take 1 tablet (20 mg total) by mouth daily with supper. 30 tablet 9  . simethicone (MYLICON) 125 MG chewable tablet Chew 125 mg by mouth every 6 (six) hours as needed for flatulence.    . triamcinolone (NASACORT) 55 MCG/ACT AERO nasal inhaler Place 2 sprays into the nose daily. 1 Inhaler 12   No current facility-administered medications for this visit.     PHYSICAL EXAMINATION: ECOG PERFORMANCE STATUS: 1 - Symptomatic but completely ambulatory  Vitals:   06/20/18 0945  BP: (!) 143/105  Pulse: 98  Resp: 18  Temp: 98.1 F (36.7 C)  SpO2: 98%   Filed Weights   06/20/18 0945  Weight: 232 lb 3.2 oz (105.3 kg)    GENERAL:alert, no distress and comfortable.  She looks  mildly cushingoid SKIN: skin color, texture, turgor are normal, no rashes or significant lesions.  Noted skin bruises from her recent falls NEURO: alert & oriented x 3 with fluent speech, no focal motor/sensory deficits  LABORATORY DATA:  I have reviewed the data as listed    Component Value Date/Time   NA 142 06/20/2018 0850   K 3.6 06/20/2018 0850   CL 104 06/20/2018 0850   CO2 26 06/20/2018 0850   GLUCOSE 123 (H) 06/20/2018 0850   BUN 10 06/20/2018 0850   CREATININE 0.72 06/20/2018 0850   CREATININE 0.71 05/23/2018 0905   CREATININE 0.68 07/27/2017 0851   CALCIUM 8.9 06/20/2018 0850   PROT 6.6 06/20/2018 0850   ALBUMIN 3.4 (L) 06/20/2018 0850   AST 25 06/20/2018 0850   AST 24 05/23/2018 0905   ALT 51 (H) 06/20/2018 0850   ALT 49 (H)   05/23/2018 0905   ALKPHOS 120 06/20/2018 0850   BILITOT <0.2 (L) 06/20/2018 0850   BILITOT 0.2 (L) 05/23/2018 0905   GFRNONAA >60 06/20/2018 0850   GFRNONAA >60 05/23/2018 0905   GFRNONAA 106 07/27/2017 0851   GFRAA >60 06/20/2018 0850   GFRAA >60 05/23/2018 0905   GFRAA 123 07/27/2017 0851    No results found for: SPEP, UPEP  Lab Results  Component Value Date   WBC 15.7 (H) 06/20/2018   NEUTROABS 11.2 (H) 06/20/2018   HGB 11.8 (L) 06/20/2018   HCT 38.8 06/20/2018   MCV 102.6 (H) 06/20/2018   PLT 516 (H) 06/20/2018      Chemistry      Component Value Date/Time   NA 142 06/20/2018 0850   K 3.6 06/20/2018 0850   CL 104 06/20/2018 0850   CO2 26 06/20/2018 0850   BUN 10 06/20/2018 0850   CREATININE 0.72 06/20/2018 0850   CREATININE 0.71 05/23/2018 0905   CREATININE 0.68 07/27/2017 0851      Component Value Date/Time   CALCIUM 8.9 06/20/2018 0850   ALKPHOS 120 06/20/2018 0850   AST 25 06/20/2018 0850   AST 24 05/23/2018 0905   ALT 51 (H) 06/20/2018 0850   ALT 49 (H) 05/23/2018 0905   BILITOT <0.2 (L) 06/20/2018 0850   BILITOT 0.2 (L) 05/23/2018 0905      All questions were answered. The patient knows to call the clinic  with any problems, questions or concerns. No barriers to learning was detected.  I spent 30 minutes counseling the patient face to face. The total time spent in the appointment was 40 minutes and more than 50% was on counseling and review of test results   , MD 06/20/2018 12:57 PM  

## 2018-06-20 NOTE — Assessment & Plan Note (Signed)
She has completed recent adjuvant treatment Tumor marker is stable

## 2018-06-20 NOTE — Assessment & Plan Note (Signed)
She is confirmed to have Lynch syndrome She needs to follow with gastroenterologist for screening colonoscopy Her bowel habits has improved since discontinuation of treatment

## 2018-06-20 NOTE — Assessment & Plan Note (Signed)
She has completed treatment and her blood counts are recovered There is no contraindication for her to proceed with disease modifying treatment as directed by her rheumatologist We discussed slow prednisone taper

## 2018-06-20 NOTE — Telephone Encounter (Signed)
Last Visit: 05/15/2018 Next Visit: 10/16/2018  Okay to refill per Dr. Estanislado Pandy.

## 2018-06-20 NOTE — Assessment & Plan Note (Signed)
Recent CT imaging show no evidence of disease Tumor marker is stable We will continue monitoring of Ca1 25

## 2018-06-20 NOTE — Assessment & Plan Note (Signed)
She has completed chemotherapy Tumor marker is pending She is due for CT imaging next month I will schedule the imaging study and review test result with her She is recovering slowly from recent side effects of treatment

## 2018-06-20 NOTE — Assessment & Plan Note (Signed)
She has mild symptom of dysuria and urinary frequency I recommend urinalysis and urine culture She agreed to proceed

## 2018-06-20 NOTE — Assessment & Plan Note (Signed)
She has incidental finding of pulmonary embolism.  We discussed the risk, benefits, side effects of anticoagulation therapy. The plan will be to continue on treatment for minimum 6 months to a year.

## 2018-06-20 NOTE — Assessment & Plan Note (Signed)
Peripheral neuropathy is stable She will continue on gabapentin

## 2018-06-20 NOTE — Assessment & Plan Note (Signed)
She continues to have significant hot flashes and mild depression from recent treatment We discussed the risk and benefit of citalopram and she agreed to try

## 2018-06-21 ENCOUNTER — Telehealth: Payer: Self-pay

## 2018-06-21 ENCOUNTER — Encounter: Payer: Self-pay | Admitting: Hematology and Oncology

## 2018-06-21 ENCOUNTER — Telehealth: Payer: Self-pay | Admitting: Hematology and Oncology

## 2018-06-21 LAB — URINE CULTURE

## 2018-06-21 LAB — CANCER ANTIGEN 19-9: CA 19-9: 8 U/mL (ref 0–35)

## 2018-06-21 NOTE — Telephone Encounter (Signed)
-----   Message from Heath Lark, MD sent at 06/21/2018  8:45 AM EDT ----- Regarding: urine culture insignificant growth This is not considered UTI No need antibiotics for now ----- Message ----- From: Interface, Lab In [place] Sent: 06/20/2018   9:02 AM EDT To: Heath Lark, MD

## 2018-06-21 NOTE — Telephone Encounter (Signed)
Based on patient's labs from Dr. Alvy Bimler on 06/20/2018, is patient able to restart orencia the first week in May?  You spoke with Dr. Alvy Bimler on 06/07/2018.

## 2018-06-21 NOTE — Telephone Encounter (Signed)
Tried to reach regarding schedule °

## 2018-06-21 NOTE — Telephone Encounter (Signed)
I returned patient's call.  I reviewed the labs with her.  Her liver functions are still elevated.  I advised her to resume Orencia but hold methotrexate until her liver functions are back to normal.  She will have repeat labs with Dr. Alvy Bimler in May.  She also had some proteinuria.  She will follow-up with Dr. Alvy Bimler .

## 2018-06-21 NOTE — Telephone Encounter (Signed)
Called and given below message. She verbalized understanding. 

## 2018-06-22 ENCOUNTER — Other Ambulatory Visit: Payer: Self-pay | Admitting: Hematology and Oncology

## 2018-06-22 DIAGNOSIS — R300 Dysuria: Secondary | ICD-10-CM

## 2018-06-23 ENCOUNTER — Encounter: Payer: Self-pay | Admitting: Rheumatology

## 2018-06-23 DIAGNOSIS — Z9225 Personal history of immunosupression therapy: Secondary | ICD-10-CM

## 2018-06-23 MED ORDER — ABATACEPT 125 MG/ML ~~LOC~~ SOAJ: 125.0000 mg | SUBCUTANEOUS | 0 refills | Status: DC

## 2018-06-23 NOTE — Telephone Encounter (Addendum)
Last Visit: 05/15/2018 Next Visit: 10/16/2018 Labs: 05/23/18 RBC 3.56, Hgb 11.1 Hct 35.9, MCV 100.8,RDW 20.4, Platelets 458 Neutro Abs 23.5 Glucose 221 ALT 49, Alk phos 188 TB Gold: 10/01/16  Sent message to patient via my chart to advise she is due to update TB Gold.  Okay to refill 30 day supply per Dr. Estanislado Pandy

## 2018-06-23 NOTE — Addendum Note (Signed)
Addended by: Carole Binning on: 06/23/2018 01:51 PM   Modules accepted: Orders

## 2018-06-26 ENCOUNTER — Encounter: Payer: Self-pay | Admitting: Hematology and Oncology

## 2018-06-27 ENCOUNTER — Encounter: Payer: Self-pay | Admitting: Rheumatology

## 2018-06-27 ENCOUNTER — Encounter: Payer: Self-pay | Admitting: Hematology and Oncology

## 2018-06-27 DIAGNOSIS — Z9225 Personal history of immunosupression therapy: Secondary | ICD-10-CM | POA: Diagnosis not present

## 2018-06-28 NOTE — Telephone Encounter (Signed)
Awaiting TB lab result.

## 2018-06-29 ENCOUNTER — Other Ambulatory Visit: Payer: Self-pay | Admitting: Hematology and Oncology

## 2018-06-29 ENCOUNTER — Telehealth: Payer: Self-pay | Admitting: Hematology and Oncology

## 2018-06-29 DIAGNOSIS — R300 Dysuria: Secondary | ICD-10-CM

## 2018-06-29 DIAGNOSIS — N390 Urinary tract infection, site not specified: Secondary | ICD-10-CM | POA: Insufficient documentation

## 2018-06-29 MED ORDER — NITROFURANTOIN MONOHYD MACRO 100 MG PO CAPS: 100.0000 mg | ORAL_CAPSULE | Freq: Two times a day (BID) | ORAL | 0 refills | Status: DC

## 2018-06-29 NOTE — Telephone Encounter (Signed)
Changed date and time of lab and port flush for CT per sch msg. Called and spoke with patient. Confirmed

## 2018-06-30 ENCOUNTER — Telehealth: Payer: Self-pay | Admitting: *Deleted

## 2018-06-30 LAB — QUANTIFERON-TB GOLD PLUS
Mitogen-NIL: >10 IU/mL
NIL: 0.03 IU/mL
QuantiFERON-TB Gold Plus: NEGATIVE
TB1-NIL: 0 IU/mL
TB2-NIL: 0 IU/mL

## 2018-06-30 NOTE — Telephone Encounter (Signed)
Spoke with patient and advised her that she may come by the office to pick up sample of Orencia. Patient verbalized understanding.

## 2018-06-30 NOTE — Telephone Encounter (Signed)
Ok to give Orencia sample.

## 2018-06-30 NOTE — Telephone Encounter (Signed)
[facility] contacted the office. Reached out and spoke with Suezanne Jacquet the pharmacist. They are requesting results for TB Gold before shipping medication. TB Gold has not resulted. They will wait to ship until they have results. Spoke with patient to advise. Would you like to give patient a sample to get her restarted on the [place] while waiting for them to ship medication or wait on results? Please advise.

## 2018-07-04 ENCOUNTER — Other Ambulatory Visit: Payer: Self-pay

## 2018-07-04 ENCOUNTER — Inpatient Hospital Stay: Payer: BLUE CROSS/BLUE SHIELD | Attending: Hematology and Oncology

## 2018-07-04 DIAGNOSIS — E099 Drug or chemical induced diabetes mellitus without complications: Secondary | ICD-10-CM | POA: Insufficient documentation

## 2018-07-04 DIAGNOSIS — Z1509 Genetic susceptibility to other malignant neoplasm: Secondary | ICD-10-CM | POA: Diagnosis not present

## 2018-07-04 DIAGNOSIS — C7961 Secondary malignant neoplasm of right ovary: Secondary | ICD-10-CM | POA: Diagnosis not present

## 2018-07-04 DIAGNOSIS — R748 Abnormal levels of other serum enzymes: Secondary | ICD-10-CM | POA: Diagnosis not present

## 2018-07-04 DIAGNOSIS — K521 Toxic gastroenteritis and colitis: Secondary | ICD-10-CM | POA: Diagnosis not present

## 2018-07-04 DIAGNOSIS — C252 Malignant neoplasm of tail of pancreas: Secondary | ICD-10-CM | POA: Insufficient documentation

## 2018-07-04 DIAGNOSIS — K869 Disease of pancreas, unspecified: Secondary | ICD-10-CM

## 2018-07-04 DIAGNOSIS — K909 Intestinal malabsorption, unspecified: Secondary | ICD-10-CM

## 2018-07-04 DIAGNOSIS — C541 Malignant neoplasm of endometrium: Secondary | ICD-10-CM | POA: Diagnosis not present

## 2018-07-04 DIAGNOSIS — Z79899 Other long term (current) drug therapy: Secondary | ICD-10-CM | POA: Diagnosis not present

## 2018-07-04 DIAGNOSIS — Z9081 Acquired absence of spleen: Secondary | ICD-10-CM | POA: Diagnosis not present

## 2018-07-04 DIAGNOSIS — I2699 Other pulmonary embolism without acute cor pulmonale: Secondary | ICD-10-CM | POA: Diagnosis not present

## 2018-07-04 DIAGNOSIS — M0609 Rheumatoid arthritis without rheumatoid factor, multiple sites: Secondary | ICD-10-CM | POA: Insufficient documentation

## 2018-07-04 DIAGNOSIS — K87 Disorders of gallbladder, biliary tract and pancreas in diseases classified elsewhere: Secondary | ICD-10-CM

## 2018-07-05 ENCOUNTER — Telehealth: Payer: Self-pay

## 2018-07-05 NOTE — Telephone Encounter (Signed)
-----  Message from [name], MD sent at 07/05/2018 10:35 AM EDT ----- Santiago Glad, Thanks. We will reach out to her.     Jonae Renshaw, Can you call her to offer telemed visit next Monday May 18th for lynch syndrome, multiple cancers.  thanks   ----- Message ----- From: Clarene Essex, Counselor Sent: 07/05/2018  10:27 AM EDT To: [name], MD  Positive for MSH2 Lynch syndrome.  She plans on calling your office to set up an appointment.  Patient has history of colon cancer in her 3s, and recent diagnosis of ovarian/uterine and pancreatic cancer.

## 2018-07-05 NOTE — Telephone Encounter (Signed)
appt made for 5/18 pt aware

## 2018-07-07 ENCOUNTER — Other Ambulatory Visit: Payer: BLUE CROSS/BLUE SHIELD

## 2018-07-07 ENCOUNTER — Telehealth: Payer: Self-pay

## 2018-07-07 ENCOUNTER — Ambulatory Visit (HOSPITAL_COMMUNITY): Payer: BLUE CROSS/BLUE SHIELD

## 2018-07-07 LAB — PANCREATIC ELASTASE, FECAL: Pancreatic Elastase-1, Stool: 277 ug Elast./g (ref >200)

## 2018-07-07 NOTE — Telephone Encounter (Signed)
Spoke with pt by phone and gave results of stool specimen.

## 2018-07-10 ENCOUNTER — Ambulatory Visit (INDEPENDENT_AMBULATORY_CARE_PROVIDER_SITE_OTHER): Payer: BLUE CROSS/BLUE SHIELD | Admitting: Gastroenterology

## 2018-07-10 ENCOUNTER — Other Ambulatory Visit: Payer: Self-pay

## 2018-07-10 ENCOUNTER — Other Ambulatory Visit: Payer: BLUE CROSS/BLUE SHIELD

## 2018-07-10 ENCOUNTER — Encounter: Payer: Self-pay | Admitting: Gastroenterology

## 2018-07-10 VITALS — Ht 62.0 in | Wt 210.0 lb

## 2018-07-10 DIAGNOSIS — Z1509 Genetic susceptibility to other malignant neoplasm: Secondary | ICD-10-CM

## 2018-07-10 NOTE — Progress Notes (Signed)
This service was provided via virtual visit.  She was unable to use AV method and so we used audio alone.  The patient was located at home.  I was located in my office.  The patient did consent to this virtual visit and is aware of possible charges through their insurance for this visit.  The patient is a new patient.  My certified medical assistant, Grace Bushy, contributed to this visit by contacting the patient by phone 1 or 2 business days prior to the appointment and also followed up on the recommendations I made after the visit.  Time spent on virtual visit: 28 min   HPI: This is a very pleasant 43yo woman  She has genetically proven Lynch Syndrome MSH2 mutation. 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 She had colon cancer in her 35s.  She has also had ovarian cancer, this is not a metastasis from her adenocarcinoma from what she tells me.   She is on xarelto for incidental PE.  She will be off in August after about a 6 month course.  She started having back pains, presumed to be muscl/skeltal.  Then upper abdominal pains started.  She went to see Dr. Oletta Lamas at [facility] who made her oringinal colon cancer diagnosis.  She underwent a CT scan that found ovarian issue, also an abnormal pancreas that she was told should just be followed.  She underwent total hysterectomy and bilateral ovarian resection.  INcidental minor uterine cancer noted in path sample.  She had been getting colo/egd every 3-5 years with Dr. Oletta Lamas  Her most recent colonoscopy and EGD:  2019 sometime (Dr. Oletta Lamas at [facility]).  She changed her care to [facility], underwent EUS with Dr. Delrae Alfred.  Then underwent a distal pancreatectomy/splenectomy.  Path showed chemo effect, T2 (stage 1B).    She has undergone 10 rounds of folfurinox.  Her last round was 1.5 months ago.  She is getting a restaging CT scan next week.  Tells me she asked twice to see Dr. Paulita Fujita but was 'ignored.'  Labs 05/2018 show  normal cmet except alb 3.4, alt 51, ca 19-9 8, wbc 16, hb 11.8, plt 516  Her son had some polyps on colonoscopy but genetically negative for Lynch on genetic testing; Dr. Delrae Alfred.  She does not think they were pre-cancerous polyps.  Chief complaint is Lynch syndrome  ROS: complete GI ROS as described in HPI, all other review negative.  Constitutional:  No unintentional weight loss   Past Medical History:  Diagnosis Date  . Allergic rhinitis, seasonal   . 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 Endometrial cancer ([place]) 08/2017   Stage IA, Grade 1  . History of MRSA infection 01/2017   followed by infectious disease center--  recurrent pustular folliculitis  . Nephrolithiasis    bilateral nonobstructive calculi per CT 08-18-2017  . OA (osteoarthritis)   . Ovarian cancer, right ([place]) 08/2017   Stage II Grade 2 Endometrioid  . Pancreatic cancer ([place])   . Rheumatoid arthritis [facility])    rheumatologist-  dr devenswar-- treated w/ oral prednisone daily and methotrexate injection every 3 wks    Past Surgical History:  Procedure Laterality Date  . BREAST LUMPECTOMY WITH RADIOACTIVE SEED LOCALIZATION Left 06/28/2014   Benign Procedure: RADIOACTIVE SEED LOCALIZATION LEFT BREAST LUMPECTOMY;  Surgeon: Excell Seltzer, MD;  Location: [facility];  Service: General;  Laterality: Left;  . COLONOSCOPY  06/19/14  .  EYE SURGERY Left    plug in tear duct  . IR IMAGING GUIDED PORT INSERTION  09/26/2017  . KNEE ARTHROSCOPY    . PANCREATECTOMY  11/25/2017  . PORT A CATH REVISION  2001   in and out  . RIGHT COLECTOMY  06/02/2009   cecum cancer  . ROBOTIC ASSISTED TOTAL HYSTERECTOMY WITH BILATERAL SALPINGO OOPHERECTOMY Bilateral 08/30/2017   Procedure: XI ROBOTIC ASSISTED TOTAL  HYSTERECTOMY BILATERAL  SALPINGO OOPHORECTOMY; LYSIS OF ADHESIONS;  Surgeon: Isabel Caprice, MD;  Location: [facility];  Service: Gynecology;  Laterality:  Bilateral;  . SPLENECTOMY, PARTIAL  11/25/2017    Current Outpatient Medications  Medication Sig Dispense Refill  . Abatacept (ORENCIA CLICKJECT) 191 MG/ML SOAJ Inject 125 mg into the skin once a week. 4 Syringe 0  . acetaminophen (TYLENOL) 650 MG CR tablet Take 1,300 mg by mouth every 8 (eight) hours.    . citalopram (CELEXA) 20 MG tablet Take 1 tablet (20 mg total) by mouth daily. 30 tablet 11  . diclofenac sodium (VOLTAREN) 1 % GEL APPLY TO AFFECTED 3 LARGE JOINTS UP TO 3 TIMES DAILY AS NEEDED AS DIRECTED 300 g 1  . diphenoxylate-atropine (LOMOTIL) 2.5-0.025 MG tablet Take 1-2 tablets by mouth 4 (four) times daily as needed for diarrhea or loose stools. 1 to 2 po QID prn diarrhea 90 tablet 1  . gabapentin (NEURONTIN) 300 MG capsule TAKE 1 CAPSULE BY MOUTH THREE TIMES A DAY 270 capsule 11  . lidocaine-prilocaine (EMLA) cream Apply to affected area once (Patient taking differently: Apply 1 application topically daily as needed (port). Apply to affected area once) 30 g 3  . loratadine (CLARITIN) 10 MG tablet Take 10 mg by mouth daily after breakfast.    . metFORMIN (GLUCOPHAGE) 500 MG tablet TAKE 1 TABLET (500 MG TOTAL) BY MOUTH 2 (TWO) TIMES DAILY WITH A MEAL. 60 tablet 1  . naproxen sodium (ALEVE) 220 MG tablet Take 220 mg by mouth as needed.     Marland Kitchen omeprazole (PRILOSEC) 20 MG capsule Take 20 mg by mouth 2 (two) times daily before a meal.     . predniSONE (DELTASONE) 5 MG tablet TAKE 1 TABLET BY MOUTH EVERY DAY WITH BREAKFAST 30 tablet 0  . rivaroxaban (XARELTO) 20 MG TABS tablet Take 1 tablet (20 mg total) by mouth daily with supper. 30 tablet 9  . simethicone (MYLICON) 478 MG chewable tablet Chew 125 mg by mouth every 6 (six) hours as needed for flatulence.    . triamcinolone (NASACORT) 55 MCG/ACT AERO nasal inhaler Place 2 sprays into the nose daily. 1 Inhaler 12   No current facility-administered medications for this visit.     Allergies as of 07/10/2018 - Review Complete 07/10/2018   Allergen Reaction Noted  . Codeine Other (See Comments) 06/28/2014  . Penicillins  06/11/2016  . Bactrim [sulfamethoxazole-trimethoprim] Rash 06/19/2014    Family History  Problem Relation Age of Onset  . Arthritis/Rheumatoid Mother   . Uterine cancer Mother 12  . Allergic rhinitis Father   . Heart disease Father   . Drug abuse Son   . Other Maternal Aunt        Unknown GYN CA  . Colon cancer Maternal Uncle 84  . Diabetes Maternal Grandmother   . Cancer Maternal Grandfather        d. 42s of liver/lung cancer  . Angioedema Neg Hx   . Asthma Neg Hx   . Atopy Neg Hx   . Eczema Neg Hx   .  Immunodeficiency Neg Hx   . Urticaria Neg Hx     Social History   Socioeconomic History  . Marital status: Married    Spouse name: Quita Skye  . Number of children: 2  . Years of education: Not on file  . Highest education level: Not on file  Occupational History  . Occupation: Futures trader  Social Needs  . Financial resource strain: Not on file  . Food insecurity:    Worry: Not on file    Inability: Not on file  . Transportation needs:    Medical: Not on file    Non-medical: Not on file  Tobacco Use  . Smoking status: Former Smoker    Packs/day: 1.00    Years: 5.00    Pack years: 5.00    Types: Cigarettes    Last attempt to quit: 02/05/1998    Years since quitting: 20.4  . Smokeless tobacco: Never Used  Substance and Sexual Activity  . Alcohol use: Not Currently  . Drug use: No  . Sexual activity: Not Currently  Lifestyle  . Physical activity:    Days per week: Not on file    Minutes per session: Not on file  . Stress: Not on file  Relationships  . Social connections:    Talks on phone: Not on file    Gets together: Not on file    Attends religious service: Not on file    Active member of club or organization: Not on file    Attends meetings of clubs or organizations: Not on file    Relationship status: Not on file  . Intimate partner violence:    Fear of current  or ex partner: Not on file    Emotionally abused: Not on file    Physically abused: Not on file    Forced sexual activity: Not on file  Other Topics Concern  . Not on file  Social History Narrative  . Not on file     Physical Exam: Unable to perform because this was a "telemed visit" due to current Covid-19 pandemic  Assessment and plan: 43 y.o. female with with LYnch syndrome  She had colon cancer in her 27s, Ovarian cancer recently.  Uterine cancer recently and also tail of pancreas adenocracinoma, resected in 2019.  She completed chemo about 6 weeks ago (tagetting ovarian and pancreas from what she tells me).  Is due for restaging CTs next week.    Last colonoscopy/EGD was in 2019 with Dr. Oletta Lamas at [facility]. She is not planning to return to his care.  We will get records from her most recent Colonoscopy and EGD to determine timing of her annual colonoscopy.  Would like it to be August or later so that she can complete the 64monthblood thinner course uninterrupted.    Please see the "Patient Instructions" section for addition details about the plan.  DOwens Loffler MD [facility] 07/10/2018, 8:16 AM

## 2018-07-10 NOTE — Patient Instructions (Addendum)
We will get records from [facility] (need her most recent, 2019 colonoscopy and EGDs). Consent form has been mailed to patient to sign and mail to [facility]  Will decide on time of colonoscopy and EGD based on review of the above.

## 2018-07-11 ENCOUNTER — Ambulatory Visit: Payer: BLUE CROSS/BLUE SHIELD | Admitting: Hematology and Oncology

## 2018-07-12 ENCOUNTER — Other Ambulatory Visit: Payer: Self-pay

## 2018-07-12 ENCOUNTER — Ambulatory Visit (HOSPITAL_COMMUNITY)
Admission: RE | Admit: 2018-07-12 | Discharge: 2018-07-12 | Disposition: A | Discharge status: Home / Self Care | Payer: BLUE CROSS/BLUE SHIELD | Source: Ambulatory Visit | Attending: Hematology and Oncology | Admitting: Hematology and Oncology

## 2018-07-12 ENCOUNTER — Inpatient Hospital Stay: Payer: BLUE CROSS/BLUE SHIELD

## 2018-07-12 ENCOUNTER — Other Ambulatory Visit: Payer: Self-pay | Admitting: Hematology and Oncology

## 2018-07-12 DIAGNOSIS — Z79899 Other long term (current) drug therapy: Secondary | ICD-10-CM | POA: Diagnosis not present

## 2018-07-12 DIAGNOSIS — D490 Neoplasm of unspecified behavior of digestive system: Secondary | ICD-10-CM | POA: Insufficient documentation

## 2018-07-12 DIAGNOSIS — M0609 Rheumatoid arthritis without rheumatoid factor, multiple sites: Secondary | ICD-10-CM | POA: Diagnosis not present

## 2018-07-12 DIAGNOSIS — Z1509 Genetic susceptibility to other malignant neoplasm: Secondary | ICD-10-CM | POA: Diagnosis not present

## 2018-07-12 DIAGNOSIS — K521 Toxic gastroenteritis and colitis: Secondary | ICD-10-CM | POA: Diagnosis not present

## 2018-07-12 DIAGNOSIS — R748 Abnormal levels of other serum enzymes: Secondary | ICD-10-CM | POA: Diagnosis not present

## 2018-07-12 DIAGNOSIS — C252 Malignant neoplasm of tail of pancreas: Secondary | ICD-10-CM

## 2018-07-12 DIAGNOSIS — C541 Malignant neoplasm of endometrium: Secondary | ICD-10-CM

## 2018-07-12 DIAGNOSIS — C7961 Secondary malignant neoplasm of right ovary: Secondary | ICD-10-CM

## 2018-07-12 DIAGNOSIS — D1809 Hemangioma of other sites: Secondary | ICD-10-CM | POA: Diagnosis not present

## 2018-07-12 DIAGNOSIS — I2699 Other pulmonary embolism without acute cor pulmonale: Secondary | ICD-10-CM | POA: Diagnosis not present

## 2018-07-12 DIAGNOSIS — E099 Drug or chemical induced diabetes mellitus without complications: Secondary | ICD-10-CM | POA: Diagnosis not present

## 2018-07-12 DIAGNOSIS — Z9081 Acquired absence of spleen: Secondary | ICD-10-CM | POA: Diagnosis not present

## 2018-07-12 DIAGNOSIS — N390 Urinary tract infection, site not specified: Secondary | ICD-10-CM

## 2018-07-12 DIAGNOSIS — R300 Dysuria: Secondary | ICD-10-CM

## 2018-07-12 LAB — CBC WITH DIFFERENTIAL/PLATELET
Abs Immature Granulocytes: 0.05 10*3/uL (ref 0.00–0.07)
Basophils Absolute: 0.1 10*3/uL (ref 0.0–0.1)
Basophils Relative: 1 %
Eosinophils Absolute: 0.2 10*3/uL (ref 0.0–0.5)
Eosinophils Relative: 1 %
HCT: 38.4 % (ref 36.0–46.0)
Hemoglobin: 11.6 g/dL — ABNORMAL LOW (ref 12.0–15.0)
Immature Granulocytes: 0 %
Lymphocytes Relative: 25 %
Lymphs Abs: 3.9 10*3/uL (ref 0.7–4.0)
MCH: 29.6 pg (ref 26.0–34.0)
MCHC: 30.2 g/dL (ref 30.0–36.0)
MCV: 98 fL (ref 80.0–100.0)
Monocytes Absolute: 1.1 10*3/uL — ABNORMAL HIGH (ref 0.1–1.0)
Monocytes Relative: 7 %
Neutro Abs: 10.3 10*3/uL — ABNORMAL HIGH (ref 1.7–7.7)
Neutrophils Relative %: 66 %
Platelets: 566 10*3/uL — ABNORMAL HIGH (ref 150–400)
RBC: 3.92 MIL/uL (ref 3.87–5.11)
RDW: 15.8 % — ABNORMAL HIGH (ref 11.5–15.5)
WBC: 15.5 10*3/uL — ABNORMAL HIGH (ref 4.0–10.5)
nRBC: 0 % (ref 0.0–0.2)

## 2018-07-12 LAB — COMPREHENSIVE METABOLIC PANEL
ALT: 39 U/L (ref 0–44)
AST: 24 U/L (ref 15–41)
Albumin: 3.4 g/dL — ABNORMAL LOW (ref 3.5–5.0)
Alkaline Phosphatase: 100 U/L (ref 38–126)
Anion gap: 13 (ref 5–15)
BUN: 13 mg/dL (ref 6–20)
CO2: 26 mmol/L (ref 22–32)
Calcium: 9.2 mg/dL (ref 8.9–10.3)
Chloride: 100 mmol/L (ref 98–111)
Creatinine, Ser: 0.68 mg/dL (ref 0.44–1.00)
GFR calc Af Amer: >60 mL/min (ref >60)
GFR calc non Af Amer: >60 mL/min (ref >60)
Glucose, Bld: 108 mg/dL — ABNORMAL HIGH (ref 70–99)
Potassium: 3.9 mmol/L (ref 3.5–5.1)
Sodium: 139 mmol/L (ref 135–145)
Total Bilirubin: <0.2 mg/dL — ABNORMAL LOW (ref 0.3–1.2)
Total Protein: 7.1 g/dL (ref 6.5–8.1)

## 2018-07-12 LAB — URINALYSIS, COMPLETE (UACMP) WITH MICROSCOPIC
Bilirubin Urine: NEGATIVE
Glucose, UA: NEGATIVE mg/dL
Hgb urine dipstick: NEGATIVE
Ketones, ur: NEGATIVE mg/dL
Nitrite: NEGATIVE
Protein, ur: NEGATIVE mg/dL
Specific Gravity, Urine: 1.043 — ABNORMAL HIGH (ref 1.005–1.030)
pH: 5 (ref 5.0–8.0)

## 2018-07-12 MED ORDER — HEPARIN SOD (PORK) LOCK FLUSH 100 UNIT/ML IV SOLN
INTRAVENOUS | Status: AC
  Filled 2018-07-12: qty 5

## 2018-07-12 MED ORDER — SODIUM CHLORIDE 0.9% FLUSH
10.0000 mL | Freq: Once | INTRAVENOUS | Status: AC
  Administered 2018-07-12: 09:00:00 10 mL
  Filled 2018-07-12: qty 10

## 2018-07-12 MED ORDER — IOHEXOL 300 MG/ML  SOLN
100.0000 mL | Freq: Once | INTRAMUSCULAR | Status: AC | PRN
  Administered 2018-07-12: 100 mL via INTRAVENOUS

## 2018-07-12 MED ORDER — HEPARIN SOD (PORK) LOCK FLUSH 100 UNIT/ML IV SOLN
500.0000 [IU] | Freq: Once | INTRAVENOUS | Status: AC
  Administered 2018-07-12: 10:00:00 500 [IU] via INTRAVENOUS

## 2018-07-12 MED ORDER — SODIUM CHLORIDE (PF) 0.9 % IJ SOLN
INTRAMUSCULAR | Status: AC
  Filled 2018-07-12: qty 50

## 2018-07-13 ENCOUNTER — Other Ambulatory Visit: Payer: Self-pay | Admitting: Rheumatology

## 2018-07-13 ENCOUNTER — Inpatient Hospital Stay (HOSPITAL_BASED_OUTPATIENT_CLINIC_OR_DEPARTMENT_OTHER): Payer: BLUE CROSS/BLUE SHIELD | Admitting: Hematology and Oncology

## 2018-07-13 ENCOUNTER — Encounter: Payer: Self-pay | Admitting: Rheumatology

## 2018-07-13 ENCOUNTER — Telehealth: Payer: Self-pay

## 2018-07-13 ENCOUNTER — Other Ambulatory Visit: Payer: Self-pay

## 2018-07-13 VITALS — BP 125/88 | HR 99 | Temp 98.3°F | Resp 18 | Ht 62.0 in | Wt 224.4 lb

## 2018-07-13 DIAGNOSIS — R748 Abnormal levels of other serum enzymes: Secondary | ICD-10-CM

## 2018-07-13 DIAGNOSIS — C7961 Secondary malignant neoplasm of right ovary: Secondary | ICD-10-CM | POA: Diagnosis not present

## 2018-07-13 DIAGNOSIS — Z1509 Genetic susceptibility to other malignant neoplasm: Secondary | ICD-10-CM | POA: Diagnosis not present

## 2018-07-13 DIAGNOSIS — Z79899 Other long term (current) drug therapy: Secondary | ICD-10-CM

## 2018-07-13 DIAGNOSIS — M0609 Rheumatoid arthritis without rheumatoid factor, multiple sites: Secondary | ICD-10-CM | POA: Diagnosis not present

## 2018-07-13 DIAGNOSIS — Z9081 Acquired absence of spleen: Secondary | ICD-10-CM | POA: Diagnosis not present

## 2018-07-13 DIAGNOSIS — K521 Toxic gastroenteritis and colitis: Secondary | ICD-10-CM

## 2018-07-13 DIAGNOSIS — I2699 Other pulmonary embolism without acute cor pulmonale: Secondary | ICD-10-CM

## 2018-07-13 DIAGNOSIS — C252 Malignant neoplasm of tail of pancreas: Secondary | ICD-10-CM

## 2018-07-13 DIAGNOSIS — E099 Drug or chemical induced diabetes mellitus without complications: Secondary | ICD-10-CM | POA: Diagnosis not present

## 2018-07-13 DIAGNOSIS — C541 Malignant neoplasm of endometrium: Secondary | ICD-10-CM

## 2018-07-13 DIAGNOSIS — D490 Neoplasm of unspecified behavior of digestive system: Secondary | ICD-10-CM

## 2018-07-13 LAB — CA 125: Cancer Antigen (CA) 125: 11.8 U/mL (ref 0.0–38.1)

## 2018-07-13 LAB — URINE CULTURE

## 2018-07-13 LAB — CANCER ANTIGEN 19-9: CA 19-9: 6 U/mL (ref 0–35)

## 2018-07-13 NOTE — Telephone Encounter (Signed)
-----   Message from Heath Lark, MD sent at 07/13/2018 12:16 PM EDT ----- Regarding: urine culture ok Pls call and let her know urine culture not significant growth ----- Message ----- From: Interface, Lab In [place] Sent: 07/12/2018   9:14 AM EDT To: Heath Lark, MD

## 2018-07-13 NOTE — Telephone Encounter (Signed)
Spoke with pt by phone and gave below msg.  

## 2018-07-13 NOTE — Telephone Encounter (Signed)
Last Visit: 05/15/2018 Next Visit: 10/16/2018 Labs: 06/20/2018 Glucose 123, Albumin 3.4, ALT 51 Total Bilirubin 0.2 WBC 15.7, RBC 3.78 Hgb 11.8 MCV 102.6 RDW 17.3 Platelets 516, Neutro Abs 11.2 Monocytes Abs 1.5 TB Gold: 06/27/18 Neg   Okay to refill per Dr. Estanislado Pandy

## 2018-07-13 NOTE — Telephone Encounter (Signed)
Okay to give her a work excuse for the next 2 months

## 2018-07-14 ENCOUNTER — Encounter: Payer: Self-pay | Admitting: Hematology and Oncology

## 2018-07-14 NOTE — Assessment & Plan Note (Signed)
Her previously elevated liver enzymes has resolved There is no contraindication for her to be treated with methotrexate in the future if necessary but I will defer to her rheumatologist for this

## 2018-07-14 NOTE — Assessment & Plan Note (Signed)
She is diagnosed with Lynch syndrome and is at risk of other malignancies such as recurrent colon cancer I recommend follow-up with her gastroenterologist for repeat colonoscopy at a more frequent basis.

## 2018-07-14 NOTE — Assessment & Plan Note (Signed)
Her blood clot has resolved She needs to complete minimum 6 months of treatment, until August 2020.  We were discussed further after that whether she needs to be on low-dose secondary prevention therapy versus aspirin

## 2018-07-14 NOTE — Progress Notes (Signed)
[place] OFFICE PROGRESS NOTE  Patient Care Team: [name], MD as PCP - General (Family Medicine)  ASSESSMENT & PLAN:  Pancreatic cancer [facility]) I have reviewed all test results with the patient There is a small area of fluid collection near the surgical resection site, likely due to postop fluid collection It is stable and likely benign Tumor marker is within normal range She will continue CT imaging every 3 months for the first year  Secondary malignant neoplasm of right ovary [facility]) She has no signs of cancer recurrence Tumor marker is stable Continue tumor marker monitoring every 3 months  Endometrial cancer ([place]) CT imaging show no signs of disease.  Observe only  Pulmonary embolism ([place]) Her blood clot has resolved She needs to complete minimum 6 months of treatment, until August 2020.  We were discussed further after that whether she needs to be on low-dose secondary prevention therapy versus aspirin  Lynch syndrome She is diagnosed with Lynch syndrome and is at risk of other malignancies such as recurrent colon cancer I recommend follow-up with her gastroenterologist for repeat colonoscopy at a more frequent basis.  Rheumatoid arthritis of multiple sites with negative rheumatoid factor ([place]) She will continue close follow-up with rheumatologist I will defer to her for further management The patient is attempting prednisone taper  Steroid-induced diabetes ([place]) This is improving Once she is off prednisone therapy, we can consider discontinuation of metformin  S/P splenectomy With her postsplenectomy situation, she has chronic leukocytosis and thrombocytosis.  This is a benign presentation and no further work-up is needed  Diarrhea due to drug She continues to have diarrhea despite discontinuation of chemotherapy for over 1 month Recent stool elastase test came back within normal limits I do not believe she has malabsorption secondary to  pancreatectomy It is possible she might have bile salt diarrhea She will continue Lomotil or Imodium as needed I recommend GI follow-up  Elevated liver enzymes Her previously elevated liver enzymes has resolved There is no contraindication for her to be treated with methotrexate in the future if necessary but I will defer to her rheumatologist for this   Orders Placed This Encounter  Procedures  . CT ABDOMEN PELVIS W CONTRAST    Standing Status:   Future    Standing Expiration Date:   07/14/2019    Order Specific Question:   If indicated for the ordered procedure, I authorize the administration of contrast media per Radiology protocol    Answer:   Yes    Order Specific Question:   Preferred imaging location?    Answer:   [facility]    Order Specific Question:   Radiology Contrast Protocol - do NOT remove file path    Answer:   _0 [email or web address]    Order Specific Question:   Is patient pregnant?    Answer:   No    INTERVAL HISTORY: Please see below for problem oriented charting. She returns for further follow-up She feels well She continues to have intermittent diarrhea especially after meals Her joint pain is severe because she is attempting to taper prednisone She saw her rheumatologist and had restarted [place] She denies recent fever or chills No abdominal pain She has numerous questions regarding returning back to work and future follow-up  SUMMARY OF ONCOLOGIC HISTORY: Oncology History   MSI positive  Endometrial :endometrioid Ovarian: Endometrioid  Lynch syndrome due to MSH2 c.[id number]      Endometrial cancer ([place])   08/18/2017 Imaging    Ct  scan abdomen and pelvis 1. Mixed attenuation mass emanates from the right adnexa measuring 10.8 x 8.0 cm very suspicious for right ovarian carcinoma. 2. Abnormality of the tail of the pancreas may be due to mild pancreatitis and small pseudocyst formation, but neoplasm cannot be  excluded. 3. Small amount of ascites within abdomen and pelvis. 4. Small nonobstructing renal calculi bilaterally.     08/30/2017 Pathology Results    1. Uterus and cervix, with left fallopian tube - ENDOMETRIOID ADENOCARCINOMA, FIGO GRADE I, ARISING IN A BACKGROUND OF DIFFUSE COMPLEX ATYPICAL HYPERPLASIA. - CARCINOMA INVADES FOR OF DEPTH OF 0.2 CM WHERE THICKNESS OF MYOMETRIAL WALL IS 2.1 CM. - ALL RESECTION MARGINS ARE NEGATIVE FOR CARCINOMA. - NEGATIVE FOR LYMPHOVASCULAR OR PERINEURAL INVASION. - CERVICAL STROMA IS NOT INVOLVED. - SEE ONCOLOGY TABLE. - SEE NOTE 2. Ovary and fallopian tube, right - PRIMARY OVARIAN ENDOMETRIOID ADENOCARCINOMA, FIGO GRADE II, 12 CM. - THE OVARIAN SURFACE IS FOCALLY INVOLVED BY CARCINOMA. - NEGATIVE FOR LYMPHOVASCULAR INVASION. - BENIGN UNREMARKABLE FALLOPIAN TUBE, NEGATIVE FOR CARCINOMA. - SEE ONCOLOGY TABLE. - SEE NOTE 3. Cul-de-sac biopsy - METASTATIC ADENOCARCINOMA, MOST CONSISTENT WITH PRIMARY OVARIAN ENDOMETRIOID ADENOCARCINOMA. 4. Ovary, left - BENIGN UNREMARKABLE OVARY, NEGATIVE FOR MALIGNANCY. Microscopic Comment 1. UTERUS, CARCINOMA OR CARCINOSARCOMA Procedure: Total hysterectomy with bilateral salpingo-oophorectomy. Histologic type: Endometrioid adenocarcinoma. Histologic Grade: FIGO Grade I Myometrial invasion: Depth of invasion: 2 mm Myometrial thickness: 21 mm Uterine Serosa Involvement: Not identified Cervical stromal involvement: Not identified Extent of involvement of other organs: Not applicable Lymphovascular invasion: Not identified Regional Lymph Nodes: Examined: 0 Sentinel 0 Non-sentinel 0 Total Tumor block for ancillary studies: 1H MMR / MSI testing: Pending Pathologic Stage Classification (pTNM, AJCC 8th edition): pT1a, pNX (v4.1.0.0) 2. OVARY or FALLOPIAN TUBE or PRIMARY PERITONEUM: Procedure: Salpingo-oophorectomy Specimen Integrity: Intact Tumor Site: Right ovary Ovarian Surface Involvement (required only if  applicable): Focally involved by carcinoma Fallopian Tube Surface Involvement (required only if applicable): Not identified Tumor Size: 12 cm Histologic Type: Endometrioid adenocarcinoma Histologic Grade: Grade II Implants (required for advanced stage serous/seromucinous borderline tumors only): Not applicable Other Tissue/ Organ Involvement: Cul de sac biopsy involved by tumor Largest Extrapelvic Peritoneal Focus (required only if applicable): Not applicable Peritoneal/Ascitic Fluid: Negative for carcinoma (case # [id number]) Treatment Effect (required only for high-grade serous carcinomas): Not applicable Regional Lymph Nodes: No lymph nodes submitted or found Number of Lymph Nodes Examined: 0 Pathologic Stage Classification (pTNM, AJCC 8th Edition): pT2b, pN0 Representative Tumor Block: 2B and 2E 1. Molecular study for microsatellite instability and immunohistochemical stains for MMR-related proteins are pending and will be reported in an addendum. 2. Immunohistochemical stain show that the ovarian tumor is positive for CK7 and PAX8 (both diffuse), CDX2 (patchy and weak); and negative for CK20. This immunoprofile is consistent with the above diagnosis. Dr. Lyndon Code has reviewed this case and concurs with the above diagnosis. Molecular study for microsatellite instability and immunohistochemical stains for MMR-related proteins are pending and will be reported in an addendum    08/30/2017 Genetic Testing    Patient has genetic testing done for MSI  Results revealed patient has the following mutation(s): loss of [facility] 2    08/30/2017 Surgery    Surgeon: Mart Piggs, MD Pre-operative Diagnosis:  1. Adnexal mass 2. Abnormal uterine bleeding 3. H/o Cecal CA  Post-operative Diagnosis:  1. Adhesive disease post colon resection 2. Endometrial cancer NOS 3. Adenocarcinoma unknown origin, right ovary, suspicious for GI primary  Operation:  1. Lysis of adhesions ~30  minutes 2. Robotic-assisted laparoscopic total hysterectomy with right salpingo-oophorectomy and left salpingectomy 3. Left oophorectomy (RA-laparoscopic) 4. Pelvic washings  Findings: Adhesions of omentum to anterior abdominal wall. Enlarged cystic right ovary ~10cm. Uterus had small nodules on serosa near where right adnexa was intimate with the surface. No obvious intraoperative rupture of cyst, although in 2 areas the wall was thin and one of these areas had some bleeding. Slight scarring of left bladder dome to LUS/cervix. Uterus on frozen section c/w hyperplasia and a small focus of endometrial CA - no myo invasion, <2cm in size. Frozen section on the right adnexa was carcinoma, met from colon or possibly Gyn, favor GI primary, defer to permanent. Left ovary was WNL.      09/15/2017 Cancer Staging    Staging form: Corpus Uteri - Carcinoma and Carcinosarcoma, AJCC 8th Edition - Pathologic: FIGO Stage IA (pT1a, pN0, cM0) - Signed by Heath Lark, MD on 09/15/2017    09/26/2017 Procedure    Successful placement of a right internal jugular approach power injectable Port-A-Cath. The catheter is ready for immediate use.    11/07/2017 Imaging    1. Since 08/18/2017, similar to slight decrease in size of a pancreatic body/tail junction lesion. Cross modality comparison relative to 09/16/2017 MRI is also grossly similar. 2. No evidence of metastatic disease. 3. Aortic Atherosclerosis (ICD10-I70.0).  4. Left nephrolithiasis.    11/18/2017 Genetic Testing    MSH2 c.2237dupT pathogenic mutation identified in the CancerNext panel.  The CancerNext gene panel offered by [employer] includes sequencing and rearrangement analysis for the following 34 genes:   APC, ATM, BARD1, BMPR1A, BRCA1, BRCA2, BRIP1, CDH1, CDK4, CDKN2A, CHEK2, DICER1, HOXB13, EPCAM, GREM1, MLH1, MRE11A, MSH2, MSH6, MUTYH, NBN, NF1, PALB2, PMS2, POLD1, POLE, PTEN, RAD50, RAD51C, RAD51D, SMAD4, SMARCA4, STK11, and TP53.  The report date  is November 18, 2017.  MSH2 [id number] pathogenic mutation identified on somatic testing.  These results are consistent with a diagnosis of Lynch syndrome.     Secondary malignant neoplasm of right ovary ([place])   08/23/2017 Tumor Marker    Patient's tumor was tested for the following markers: CA-125 Results of the tumor marker test revealed 139.8    08/31/2017 Initial Diagnosis    Secondary malignant neoplasm of right ovary ([place])    09/15/2017 Cancer Staging    Staging form: Ovary, Fallopian Tube, and Primary Peritoneal Carcinoma, AJCC 8th Edition - Pathologic: Stage IIB (pT2b, pN0, cM0) - Signed by Heath Lark, MD on 09/15/2017    09/27/2017 Imaging    No evidence of metastatic disease or other acute findings within the thorax.  4 cm low-attenuation mass in pancreatic tail, highly suspicious for pancreatic carcinoma. This is caused splenic vein thrombosis, with new venous collaterals in the left upper quadrant. Consider endoscopic ultrasound with FNA for tissue diagnosis.  Stable benign hepatic hemangioma.     09/27/2017 Tumor Marker    Patient's tumor was tested for the following markers: CA-125 Results of the tumor marker test revealed 39.4    11/02/2017 Tumor Marker    Patient's tumor was tested for the following markers: CA-125 Results of the tumor marker test revealed 19    11/07/2017 Tumor Marker    Patient's tumor was tested for the following markers: CA-125 Results of the tumor marker test revealed 18.3    11/18/2017 Genetic Testing    MSH2 c.2237dupT pathogenic mutation identified in the CancerNext panel.  The CancerNext gene panel offered by [employer] includes sequencing and rearrangement analysis for the following  34 genes:   APC, ATM, BARD1, BMPR1A, BRCA1, BRCA2, BRIP1, CDH1, CDK4, CDKN2A, CHEK2, DICER1, HOXB13, EPCAM, GREM1, MLH1, MRE11A, MSH2, MSH6, MUTYH, NBN, NF1, PALB2, PMS2, POLD1, POLE, PTEN, RAD50, RAD51C, RAD51D, SMAD4, SMARCA4, STK11, and TP53.   The report date is November 18, 2017.  MSH2 [id number] pathogenic mutation identified on somatic testing.  These results are consistent with a diagnosis of Lynch syndrome.    12/15/2017 Tumor Marker    Patient's tumor was tested for the following markers: CA-125 Results of the tumor marker test revealed 57.3    01/16/2018 Tumor Marker    Patient's tumor was tested for the following markers: CA-125 Results of the tumor marker test revealed 24.2    04/10/2018 Tumor Marker    Patient's tumor was tested for the following markers: CA-125 Results of the tumor marker test revealed 18.2    07/12/2018 Tumor Marker    Patient's tumor was tested for the following markers: CA-125 Results of the tumor marker test revealed 11.8     Pancreatic cancer ([place])   10/13/2017 Pathology Results    Pancreas tail mass, endoscopic ultrasound-guided, fine needle aspiration II (smears and cell block): Adenocarcinoma    11/18/2017 Genetic Testing    MSH2 c.2237dupT pathogenic mutation identified in the CancerNext panel.  The CancerNext gene panel offered by [employer] includes sequencing and rearrangement analysis for the following 34 genes:   APC, ATM, BARD1, BMPR1A, BRCA1, BRCA2, BRIP1, CDH1, CDK4, CDKN2A, CHEK2, DICER1, HOXB13, EPCAM, GREM1, MLH1, MRE11A, MSH2, MSH6, MUTYH, NBN, NF1, PALB2, PMS2, POLD1, POLE, PTEN, RAD50, RAD51C, RAD51D, SMAD4, SMARCA4, STK11, and TP53.  The report date is November 18, 2017.  MSH2 [id number] pathogenic mutation identified on somatic testing.  These results are consistent with a diagnosis of Lynch syndrome.    11/24/2017 Pathology Results    A. "TAIL OF PANCREAS AND SPLEEN", DISTAL PANCREATECTOMY AND SPLENECTOMY: Invasive ductal adenocarcinoma, moderately to poorly differentiated with focal signet ring cell features, of pancreas (distal).   The carcinoma is 2.5 cm in greatest dimension grossly.   Treatment effects present in  the form of fibrosis (50%). No lymphovascular or definite perineural invasion identified. All surgical margins are negative for tumor or high-grade dysplasia. Adjacent mucinous neoplasm, most consistent with Intraductal papillary mucinous neoplasm (IPMN) with low-grade dysplasia.   Uninvolved pancreas show atrophy and focal acute inflammation.   Twelve benign lymph nodes (0/12). Spleen with no significant histopathologic abnormalities.  PROCEDURE: distal pancreatectomy and splenectomy TUMOR SITE: distal pancreas TUMOR SIZE:  GREATEST DIMENSION: 2.5 cm  ADDITIONAL DIMENSIONS:  x  cm HISTOLOGIC TYPE: ductal adenocarcinoma HISTOLOGIC GRADE: grade 3 TUMOR EXTENSION: peripancreatic soft tissue MARGINS: negative for tumor TREATMENT EFFECT: treatment effects present in the form of fibrosis (50%). LYMPHOVASCULAR INVASION: not identified PERINEURAL INVASION: no definite evidence REGIONAL LYMPH NODES:   NUMBER OF LYMPH NODES INVOLVED: 0   NUMBER OF LYMPH NODES EXAMINED: 12 PATHOLOGIC STAGE CLASSIFICATION (pTNM, AJCC 8th Ed): ypT2, ypN0 DISTANT METASTASIS (pM): pMx ADDITIONAL PATHOLOGIC FINDINGS: mucinous neoplasm, most consistent with intraductal papillary mucinous neoplasm (IPMN) with low-grade dysplasia, is identified adjacent to the main tumor.    11/24/2017 Cancer Staging    Staging form: Exocrine Pancreas, AJCC 8th Edition - Pathologic stage from 11/24/2017: Stage IB (pT2, pN0, cM0) - Signed by Truitt Merle, MD on 01/03/2018    11/25/2017 Surgery    She had surgery at [facility] 1. Exploratory Laparotomy 2. Distal Pancreatectomy and Splenectomy 3. Intraoperative Ultrasound 4. Open Cholecystectomy     12/15/2017 Cancer Staging  Staging form: Exocrine Pancreas, AJCC 8th Edition - Clinical: Stage IB (cT2, cN0, cM0) - Signed by Heath Lark, MD on 12/15/2017    12/28/2017 Imaging    12/28/2017 CT Abdomen  IMPRESSION: 1. Postoperative  findings from recent partial pancreatectomy including a 21 cubic cm fluid collection along the pancreatic resection margin which could represent early pseudocyst. 2. Nodularity along the lateral limb of the left adrenal gland could also be postoperative but merit surveillance, as the pancreatic lesion was in close proximity to this adrenal gland on the prior CT of 11/07/2017. 3. Asymmetric fullness inferiorly in the left breast. The patient has a history of prior left breast procedures, correlation with mammography is recommended. 4. Other imaging findings of potential clinical significance: Stable hemangioma in the left hepatic lobe. Splenectomy. Aortic Atherosclerosis (ICD10-I70.0). Right hemicolectomy. Small focus of fat necrosis in the right anterior abdominal wall subcutaneous tissues near the laparotomy site. Bilateral nonobstructive nephrolithiasis.    01/02/2018 Tumor Marker    Patient's tumor was tested for the following markers: CA-19-9 Results of the tumor marker test revealed 5    01/03/2018 - 05/25/2018 Chemotherapy    She received modified dose FOLFIRINOX    04/10/2018 Imaging    1. Distal pancreatectomy, without findings of recurrent or metastatic disease. 2. Incompletely imaged hypoenhancement within lower lobe right pulmonary artery branch is likely chronic (but interval since 10/09/2017) pulmonary embolism. Dedicated CTA could further evaluate. 3. Decrease in size of a peripancreatic complex fluid collection anteriorly, likely a resolving pseudocyst. 4. Decreased size of minimal fluid versus a borderline sized node in the gastrohepatic ligament. Recommend attention on follow-up. 5. Hepatic steatosis with a segment 4 hemangioma. 6. Aortic Atherosclerosis (ICD10-I70.0). This is significantly age advanced.    07/12/2018 Tumor Marker    Patient's tumor was tested for the following markers: CA-19-9 Results of the tumor marker test revealed 6    07/12/2018 Imaging    1. Status post  distal pancreatectomy with stable postoperative fluid collection adjacent to the ventral aspect of the pancreatic head. No findings to suggest metastatic disease in the abdomen or pelvis. 2. Hepatic steatosis with small cavernous hemangioma in segment 4A of the liver. 3. Slight decreased size of nonenlarged gastrohepatic ligament lymph node, presumably benign. 4. Aortic atherosclerosis.      REVIEW OF SYSTEMS:   Constitutional: Denies fevers, chills or abnormal weight loss Eyes: Denies blurriness of vision Ears, nose, mouth, throat, and face: Denies mucositis or sore throat Respiratory: Denies cough, dyspnea or wheezes Cardiovascular: Denies palpitation, chest discomfort or lower extremity swelling Skin: Denies abnormal skin rashes Lymphatics: Denies new lymphadenopathy or easy bruising Neurological:Denies numbness, tingling or new weaknesses Behavioral/Psych: Mood is stable, no new changes  All other systems were reviewed with the patient and are negative.  I have reviewed the past medical history, past surgical history, social history and family history with the patient and they are unchanged from previous note.  ALLERGIES:  is allergic to codeine; penicillins; and bactrim [sulfamethoxazole-trimethoprim].  MEDICATIONS:  Current Outpatient Medications  Medication Sig Dispense Refill  . acetaminophen (TYLENOL) 650 MG CR tablet Take 1,300 mg by mouth every 8 (eight) hours.    . citalopram (CELEXA) 20 MG tablet TAKE 1 TABLET BY MOUTH EVERY DAY 90 tablet 4  . diclofenac sodium (VOLTAREN) 1 % GEL APPLY TO AFFECTED 3 LARGE JOINTS UP TO 3 TIMES DAILY AS NEEDED AS DIRECTED 300 g 1  . diphenoxylate-atropine (LOMOTIL) 2.5-0.025 MG tablet Take 1-2 tablets by mouth 4 (four) times daily  as needed for diarrhea or loose stools. 1 to 2 po QID prn diarrhea 90 tablet 1  . gabapentin (NEURONTIN) 300 MG capsule TAKE 1 CAPSULE BY MOUTH THREE TIMES A DAY 270 capsule 11  . lidocaine-prilocaine (EMLA)  cream Apply to affected area once (Patient taking differently: Apply 1 application topically daily as needed (port). Apply to affected area once) 30 g 3  . loratadine (CLARITIN) 10 MG tablet Take 10 mg by mouth daily after breakfast.    . metFORMIN (GLUCOPHAGE) 500 MG tablet TAKE 1 TABLET (500 MG TOTAL) BY MOUTH 2 (TWO) TIMES DAILY WITH A MEAL. 60 tablet 1  . naproxen sodium (ALEVE) 220 MG tablet Take 220 mg by mouth as needed.     Marland Kitchen omeprazole (PRILOSEC) 20 MG capsule Take 20 mg by mouth 2 (two) times daily before a meal.     . ORENCIA CLICKJECT 458 MG/ML SOAJ INJECT ONE CLICKJECT PEN SUBCUTANEOUSLY EVERY WEEK. REFRIGERATE. ALLOWTO WARM TO ROOM TEMPERATURE PRIOR TO ADMINISTRATION. 12 Syringe 0  . predniSONE (DELTASONE) 5 MG tablet TAKE 1 TABLET BY MOUTH EVERY DAY WITH BREAKFAST 30 tablet 0  . rivaroxaban (XARELTO) 20 MG TABS tablet Take 1 tablet (20 mg total) by mouth daily with supper. 30 tablet 9  . simethicone (MYLICON) 099 MG chewable tablet Chew 125 mg by mouth every 6 (six) hours as needed for flatulence.    . triamcinolone (NASACORT) 55 MCG/ACT AERO nasal inhaler Place 2 sprays into the nose daily. 1 Inhaler 12   No current facility-administered medications for this visit.     PHYSICAL EXAMINATION: ECOG PERFORMANCE STATUS: 1 - Symptomatic but completely ambulatory  Vitals:   07/13/18 0840  BP: 125/88  Pulse: 99  Resp: 18  Temp: 98.3 F (36.8 C)  SpO2: 97%   Filed Weights   07/13/18 0840  Weight: 224 lb 6.4 oz (101.8 kg)    GENERAL:alert, no distress and comfortable Musculoskeletal:no cyanosis of digits and no clubbing  NEURO: alert & oriented x 3 with fluent speech, no focal motor/sensory deficits  LABORATORY DATA:  I have reviewed the data as listed    Component Value Date/Time   NA 139 07/12/2018 0900   K 3.9 07/12/2018 0900   CL 100 07/12/2018 0900   CO2 26 07/12/2018 0900   GLUCOSE 108 (H) 07/12/2018 0900   BUN 13 07/12/2018 0900   CREATININE 0.68 07/12/2018  0900   CREATININE 0.71 05/23/2018 0905   CREATININE 0.68 07/27/2017 0851   CALCIUM 9.2 07/12/2018 0900   PROT 7.1 07/12/2018 0900   ALBUMIN 3.4 (L) 07/12/2018 0900   AST 24 07/12/2018 0900   AST 24 05/23/2018 0905   ALT 39 07/12/2018 0900   ALT 49 (H) 05/23/2018 0905   ALKPHOS 100 07/12/2018 0900   BILITOT <0.2 (L) 07/12/2018 0900   BILITOT 0.2 (L) 05/23/2018 0905   GFRNONAA >60 07/12/2018 0900   GFRNONAA >60 05/23/2018 0905   GFRNONAA 106 07/27/2017 0851   GFRAA >60 07/12/2018 0900   GFRAA >60 05/23/2018 0905   GFRAA 123 07/27/2017 0851    No results found for: SPEP, UPEP  Lab Results  Component Value Date   WBC 15.5 (H) 07/12/2018   NEUTROABS 10.3 (H) 07/12/2018   HGB 11.6 (L) 07/12/2018   HCT 38.4 07/12/2018   MCV 98.0 07/12/2018   PLT 566 (H) 07/12/2018      Chemistry      Component Value Date/Time   NA 139 07/12/2018 0900   K 3.9 07/12/2018 0900  CL 100 07/12/2018 0900   CO2 26 07/12/2018 0900   BUN 13 07/12/2018 0900   CREATININE 0.68 07/12/2018 0900   CREATININE 0.71 05/23/2018 0905   CREATININE 0.68 07/27/2017 0851      Component Value Date/Time   CALCIUM 9.2 07/12/2018 0900   ALKPHOS 100 07/12/2018 0900   AST 24 07/12/2018 0900   AST 24 05/23/2018 0905   ALT 39 07/12/2018 0900   ALT 49 (H) 05/23/2018 0905   BILITOT <0.2 (L) 07/12/2018 0900   BILITOT 0.2 (L) 05/23/2018 0905       RADIOGRAPHIC STUDIES: I have reviewed multiple imaging studies with the patient I have personally reviewed the radiological images as listed and agreed with the findings in the report. Ct Abdomen Pelvis W Contrast  Result Date: 07/12/2018 CLINICAL DATA:  43 year old female with history of pancreatic adenocarcinoma. EXAM: CT ABDOMEN AND PELVIS WITH CONTRAST TECHNIQUE: Multidetector CT imaging of the abdomen and pelvis was performed using the standard protocol following bolus administration of intravenous contrast. CONTRAST:  132m OMNIPAQUE IOHEXOL 300 MG/ML  SOLN  COMPARISON:  CT the abdomen and pelvis 04/10/2018. FINDINGS: Lower chest: Central venous catheter tip terminating in the right atrium. Hepatobiliary: Diffuse low attenuation throughout the hepatic parenchyma, indicative of hepatic steatosis. Peripherally hypervascular lesion in segment 4A of the liver (axial image 23 of series 2) measuring 2.9 x 1.5 cm, which demonstrates some more confluent opacification on delayed images, similar to the prior study, compatible with a cavernous hemangioma. No other suspicious cystic or solid hepatic lesions. No intra or extrahepatic biliary ductal dilatation. Status post cholecystectomy. Pancreas: Status post resection of the body and tail of the pancreas. Small low-intermediate attenuation fluid collection adjacent to the ventral aspect of the pancreatic head is stable compared to the prior study measuring 1.8 x 2.0 cm (axial image 54 of series 2). No other suspicious pancreatic lesions. No peripancreatic inflammation or new fluid collections. Spleen: Status post splenectomy. Adrenals/Urinary Tract: Bilateral kidneys and adrenal glands are normal in appearance. No hydroureteronephrosis. Urinary bladder is normal in appearance. Stomach/Bowel: Normal appearance of the stomach. No pathologic dilatation of small bowel or colon. Status post partial right hemicolectomy. Vascular/Lymphatic: Aortic atherosclerosis, without evidence of aneurysm or dissection in the abdominal or pelvic vasculature. No lymphadenopathy noted in the abdomen or pelvis. Previously noted prominent gastrohepatic ligament lymph node currently measures only 7 mm in short axis, presumably benign. Reproductive: Status post total abdominal hysterectomy and bilateral salpingo-oophorectomy. Other: No significant volume of ascites.  No pneumoperitoneum. Musculoskeletal: There are no aggressive appearing lytic or blastic lesions noted in the visualized portions of the skeleton. IMPRESSION: 1. Status post distal  pancreatectomy with stable postoperative fluid collection adjacent to the ventral aspect of the pancreatic head. No findings to suggest metastatic disease in the abdomen or pelvis. 2. Hepatic steatosis with small cavernous hemangioma in segment 4A of the liver. 3. Slight decreased size of nonenlarged gastrohepatic ligament lymph node, presumably benign. 4. Aortic atherosclerosis. Electronically Signed   By: DVinnie LangtonM.D.   On: 07/12/2018 17:27    All questions were answered. The patient knows to call the clinic with any problems, questions or concerns. No barriers to learning was detected.  I spent 25 minutes counseling the patient face to face. The total time spent in the appointment was 30 minutes and more than 50% was on counseling and review of test results  NHeath Lark MD 07/14/2018 9:01 AM

## 2018-07-14 NOTE — Assessment & Plan Note (Signed)
This is improving Once she is off prednisone therapy, we can consider discontinuation of metformin

## 2018-07-14 NOTE — Assessment & Plan Note (Signed)
CT imaging show no signs of disease.  Observe only

## 2018-07-14 NOTE — Assessment & Plan Note (Signed)
She continues to have diarrhea despite discontinuation of chemotherapy for over 1 month Recent stool elastase test came back within normal limits I do not believe she has malabsorption secondary to pancreatectomy It is possible she might have bile salt diarrhea She will continue Lomotil or Imodium as needed I recommend GI follow-up

## 2018-07-14 NOTE — Assessment & Plan Note (Signed)
I have reviewed all test results with the patient There is a small area of fluid collection near the surgical resection site, likely due to postop fluid collection It is stable and likely benign Tumor marker is within normal range She will continue CT imaging every 3 months for the first year

## 2018-07-14 NOTE — Assessment & Plan Note (Signed)
She has no signs of cancer recurrence Tumor marker is stable Continue tumor marker monitoring every 3 months

## 2018-07-14 NOTE — Assessment & Plan Note (Signed)
With her postsplenectomy situation, she has chronic leukocytosis and thrombocytosis.  This is a benign presentation and no further work-up is needed

## 2018-07-14 NOTE — Assessment & Plan Note (Signed)
She will continue close follow-up with rheumatologist I will defer to her for further management The patient is attempting prednisone taper

## 2018-07-18 ENCOUNTER — Telehealth: Payer: Self-pay | Admitting: Hematology and Oncology

## 2018-07-18 NOTE — Telephone Encounter (Signed)
Called regarding schedule °

## 2018-07-20 ENCOUNTER — Encounter: Payer: Self-pay | Admitting: Rheumatology

## 2018-07-21 ENCOUNTER — Encounter: Payer: Self-pay | Admitting: *Deleted

## 2018-07-21 ENCOUNTER — Other Ambulatory Visit: Payer: Self-pay | Admitting: Obstetrics and Gynecology

## 2018-07-21 DIAGNOSIS — Z1231 Encounter for screening mammogram for malignant neoplasm of breast: Secondary | ICD-10-CM

## 2018-07-21 MED ORDER — METHOCARBAMOL 500 MG PO TABS: 500.0000 mg | ORAL_TABLET | Freq: Three times a day (TID) | ORAL | 0 refills | Status: DC | PRN

## 2018-07-21 NOTE — Telephone Encounter (Signed)
Please call in Robaxin 500 mg po tid prn #90 tabs RX0

## 2018-07-24 DIAGNOSIS — C252 Malignant neoplasm of tail of pancreas: Secondary | ICD-10-CM | POA: Diagnosis not present

## 2018-08-01 DIAGNOSIS — L821 Other seborrheic keratosis: Secondary | ICD-10-CM | POA: Diagnosis not present

## 2018-08-01 DIAGNOSIS — D2372 Other benign neoplasm of skin of left lower limb, including hip: Secondary | ICD-10-CM | POA: Diagnosis not present

## 2018-08-01 DIAGNOSIS — L570 Actinic keratosis: Secondary | ICD-10-CM | POA: Diagnosis not present

## 2018-08-01 DIAGNOSIS — D225 Melanocytic nevi of trunk: Secondary | ICD-10-CM | POA: Diagnosis not present

## 2018-08-04 ENCOUNTER — Telehealth: Payer: Self-pay | Admitting: Gastroenterology

## 2018-08-04 NOTE — Telephone Encounter (Signed)
ROI faxed to [facility]

## 2018-08-07 ENCOUNTER — Encounter: Payer: Self-pay | Admitting: Hematology and Oncology

## 2018-08-07 ENCOUNTER — Encounter: Payer: Self-pay | Admitting: Rheumatology

## 2018-08-07 NOTE — Telephone Encounter (Signed)
Patient be able to start patient on methotrexate if it is approved by her oncologist.  She will need an appointment to discuss the dosing and is starting the medication.

## 2018-08-08 MED ORDER — METHOTREXATE (PF) 20 MG/0.4ML ~~LOC~~ SOAJ: 20.0000 mg | SUBCUTANEOUS | 0 refills | Status: DC

## 2018-08-08 NOTE — Telephone Encounter (Signed)
Staff message sent to Dr. Alvy Bimler.   Good morning Dr. Alvy Bimler,   Dana Hicks is a mutual patient with Dr. Estanislado Pandy. The patient has already restarted orencia injections. Can we start the patient on methotrexate in combination with Orencia as her rheumatoid arthritis is flaring? The patient was previously on Rasuvo 20 mg subcu weekly.   Thank you,  Francis Gaines, CMA   Will update with response from Dr. Alvy Bimler.

## 2018-08-08 NOTE — Telephone Encounter (Signed)
Bo Merino, MD  Earnestine Mealing, CMA        Please call patient and advise her to restart methotrexate. She should get labs in 2 weeks after starting methotrexate and then every 2 months. If labs are stable we continue to every 3 months.   SD

## 2018-08-08 NOTE — Telephone Encounter (Signed)
Please send a message to Dr. Simeon Craft such if patient can start on methotrexate in combination with Orencia as her rheumatoid arthritis is flaring.  If she approves that we can start her on methotrexate.  She was on Rasuvo 20 mg subcu weekly.

## 2018-08-08 NOTE — Telephone Encounter (Signed)
Heath Lark, MD  Earnestine Mealing, CMA        Can you see my notes from last month?  I am deferring decisions to her  She has completed all her chemo  From my stand point she can have whatever Dr. Estanislado Pandy felt necessary    Dr. Estanislado Pandy is aware of the message from Dr. Alvy Bimler.

## 2018-08-09 ENCOUNTER — Telehealth: Payer: Self-pay | Admitting: Pharmacist

## 2018-08-09 MED ORDER — METHOTREXATE (PF) 20 MG/0.4ML ~~LOC~~ SOAJ: 20.0000 mg | SUBCUTANEOUS | 0 refills | Status: DC

## 2018-08-09 NOTE — Telephone Encounter (Signed)
Received fax from [facility] acknowledging the receipt of OTREXUP prescription for the patient.  She has been on Rasuvo.  Called patient to ask if she had any trouble filling the medication and if she was okay with Otrexup instead of Rasuvo.  Informed patient that it is the same medication just a different brand of injectable methotrexate and the injection device is very slightly.  Patient states that she switched insurance this year and it could be due to an insurance requirement.  She is okay with Otrexup.  Informed patient that there is a co-pay card available online if her co-pay is unreasonable.  Patient verbalized understanding.  All questions encouraged and answered.  Instructed patient to call with any further questions or concerns.  Mariella Saa, PharmD, [facility] Rheumatology Clinical Pharmacist  08/09/2018 3:07 PM

## 2018-08-16 ENCOUNTER — Telehealth: Payer: Self-pay | Admitting: Gastroenterology

## 2018-08-16 NOTE — Telephone Encounter (Signed)
Dr Ardis Hughs, Patient had a pulmonary embolus 04/10/18 and started on xarelto by Dr Alvy Bimler. Blood clot has resolved,but she will be on xarelto until August 2020. Is it okay to schedule her colonoscopy in July or push it out a few months.

## 2018-08-16 NOTE — Telephone Encounter (Signed)
   [facility] gastroenterology records received.  EGD July 2019 Dr. Leonie Douglas.  Indication "abdominal pain in the left upper quadrant".  Findings a single 4 mm nonbleeding AVM was found in the stomach.  The examination was otherwise normal.  Colonoscopy July 2019 Dr. Leonie Douglas.  Indication "personal history of colon cancer."  Normal-appearing ileocolonic anastomosis right-sided.  2 subcentimeter rectal polyps were found and removed.  These were hyperplastic on pathology.    Can you please let her know that I received and reviewed her [facility] gastroenterology records.  She needs a colonoscopy July 2020 for Lynch syndrome, annual high risk screening.

## 2018-08-17 ENCOUNTER — Other Ambulatory Visit: Payer: Self-pay | Admitting: Rheumatology

## 2018-08-17 NOTE — Telephone Encounter (Signed)
Last Visit:05/15/2018 Next Visit:10/16/2018  Okay to refill per Dr. Estanislado Pandy

## 2018-08-17 NOTE — Telephone Encounter (Signed)
Definitely.  Let's plan on early September to allow her full 6 month course of blood thinner.  Thanks

## 2018-08-18 NOTE — Telephone Encounter (Signed)
Spoke to patient to let her know Dr Ardis Hughs recommendations.  Note sent to self to schedule colon for early September

## 2018-08-22 DIAGNOSIS — M25561 Pain in right knee: Secondary | ICD-10-CM | POA: Diagnosis not present

## 2018-08-22 DIAGNOSIS — M25562 Pain in left knee: Secondary | ICD-10-CM | POA: Diagnosis not present

## 2018-08-22 DIAGNOSIS — G8929 Other chronic pain: Secondary | ICD-10-CM | POA: Diagnosis not present

## 2018-08-23 DIAGNOSIS — Z1231 Encounter for screening mammogram for malignant neoplasm of breast: Secondary | ICD-10-CM | POA: Diagnosis not present

## 2018-08-24 ENCOUNTER — Inpatient Hospital Stay: Payer: BC Managed Care – PPO | Attending: Hematology and Oncology

## 2018-08-24 ENCOUNTER — Other Ambulatory Visit: Payer: Self-pay

## 2018-08-24 DIAGNOSIS — C541 Malignant neoplasm of endometrium: Secondary | ICD-10-CM | POA: Insufficient documentation

## 2018-08-24 DIAGNOSIS — C7961 Secondary malignant neoplasm of right ovary: Secondary | ICD-10-CM | POA: Diagnosis not present

## 2018-08-24 DIAGNOSIS — C252 Malignant neoplasm of tail of pancreas: Secondary | ICD-10-CM | POA: Diagnosis not present

## 2018-08-24 MED ORDER — HEPARIN SOD (PORK) LOCK FLUSH 100 UNIT/ML IV SOLN
500.0000 [IU] | Freq: Once | INTRAVENOUS | Status: AC
  Administered 2018-08-24: 500 [IU]
  Filled 2018-08-24: qty 5

## 2018-08-24 MED ORDER — SODIUM CHLORIDE 0.9% FLUSH
10.0000 mL | Freq: Once | INTRAVENOUS | Status: AC
  Administered 2018-08-24: 10 mL
  Filled 2018-08-24: qty 10

## 2018-08-30 ENCOUNTER — Encounter: Payer: Self-pay | Admitting: Rheumatology

## 2018-08-31 MED ORDER — PREDNISONE 5 MG PO TABS: 5.0000 mg | ORAL_TABLET | Freq: Every day | ORAL | 0 refills | Status: DC

## 2018-08-31 NOTE — Addendum Note (Signed)
Addended by: Carole Binning on: 08/31/2018 12:56 PM   Modules accepted: Orders

## 2018-08-31 NOTE — Telephone Encounter (Signed)
Okay to refill prednisone.

## 2018-08-31 NOTE — Telephone Encounter (Signed)
Last Visit:05/15/2018 Next Visit:10/16/2018

## 2018-09-05 ENCOUNTER — Encounter: Payer: Self-pay | Admitting: Hematology and Oncology

## 2018-09-06 ENCOUNTER — Telehealth: Payer: Self-pay | Admitting: *Deleted

## 2018-09-06 DIAGNOSIS — R300 Dysuria: Secondary | ICD-10-CM | POA: Diagnosis not present

## 2018-09-06 NOTE — Telephone Encounter (Signed)
Telephone call to patient in regards to [facility] message received. Patient has plans to go a [facility]Urgent care tonight after work. She was able to get some AZO and an OTC test for her UTI. The at home test was positive. She is not able to come to the cancer center before the lab closes.   Reminded patient she should not send mychart messages that include concerns such as symptoms of an illness or infection as these are non urgent messages. She agrees to call the office next time. She states "I didn't feel this was urgent since I get them so much".   Patient understands to call this office in the future for an needs or concerns.

## 2018-09-12 ENCOUNTER — Other Ambulatory Visit: Payer: Self-pay | Admitting: Hematology and Oncology

## 2018-09-12 ENCOUNTER — Other Ambulatory Visit: Payer: Self-pay

## 2018-09-12 ENCOUNTER — Telehealth: Payer: Self-pay

## 2018-09-12 DIAGNOSIS — N390 Urinary tract infection, site not specified: Secondary | ICD-10-CM

## 2018-09-12 NOTE — Telephone Encounter (Signed)
Pt called reporting concerns over new onset of pain in her right back/flank area. Pt currently being treated for UTI with one more day left to take Cipro.  She says she still has lower back pain and frequent urination. Pt states the right back/flank pain "is different".  She wants to know if she can be seen sooner than her 8/14 appt. Also states she is due for CT scan in Aug but this has not been scheduled. Please advise.

## 2018-09-12 NOTE — Telephone Encounter (Signed)
1) Did she have a urine culture done? She might be resistant to cipro 2) Her last Ct 2 months ago was normal. I am not sure if her insurance will allow CT sooner than 3 months. We can try it but I suggest we repeat labs and urine first before trying to get Ct done sooner 3) Ct typically is scheduled only 1-2 weeks before date requested and hence why it has not been scheduled yet  Bottom line, she can finish her cipro and maybe come back tomorrow for labs and urine tests I can call her with results and next step She can resume taking pain medications if she has some left

## 2018-09-12 NOTE — Telephone Encounter (Signed)
I have ordered her standard labs, UA and culture

## 2018-09-12 NOTE — Telephone Encounter (Signed)
She finishes Cipro tomorrow AM.   I have scheduled lab appt for Thrusday 7/23 and put in order for urinalysis and culture.  Do you want her to have blood work as well?

## 2018-09-14 ENCOUNTER — Other Ambulatory Visit: Payer: Self-pay

## 2018-09-14 ENCOUNTER — Inpatient Hospital Stay: Payer: BC Managed Care – PPO

## 2018-09-14 ENCOUNTER — Other Ambulatory Visit: Payer: Self-pay | Admitting: Rheumatology

## 2018-09-14 DIAGNOSIS — C541 Malignant neoplasm of endometrium: Secondary | ICD-10-CM

## 2018-09-14 DIAGNOSIS — C7961 Secondary malignant neoplasm of right ovary: Secondary | ICD-10-CM

## 2018-09-14 DIAGNOSIS — C252 Malignant neoplasm of tail of pancreas: Secondary | ICD-10-CM

## 2018-09-14 DIAGNOSIS — N390 Urinary tract infection, site not specified: Secondary | ICD-10-CM

## 2018-09-14 LAB — CBC WITH DIFFERENTIAL/PLATELET
Abs Immature Granulocytes: 0.1 10*3/uL — ABNORMAL HIGH (ref 0.00–0.07)
Basophils Absolute: 0.1 10*3/uL (ref 0.0–0.1)
Basophils Relative: 1 %
Eosinophils Absolute: 0 10*3/uL (ref 0.0–0.5)
Eosinophils Relative: 0 %
HCT: 35.4 % — ABNORMAL LOW (ref 36.0–46.0)
Hemoglobin: 11 g/dL — ABNORMAL LOW (ref 12.0–15.0)
Immature Granulocytes: 1 %
Lymphocytes Relative: 7 %
Lymphs Abs: 1.1 10*3/uL (ref 0.7–4.0)
MCH: 26.9 pg (ref 26.0–34.0)
MCHC: 31.1 g/dL (ref 30.0–36.0)
MCV: 86.6 fL (ref 80.0–100.0)
Monocytes Absolute: 0.9 10*3/uL (ref 0.1–1.0)
Monocytes Relative: 6 %
Neutro Abs: 13.2 10*3/uL — ABNORMAL HIGH (ref 1.7–7.7)
Neutrophils Relative %: 85 %
Platelets: 639 10*3/uL — ABNORMAL HIGH (ref 150–400)
RBC: 4.09 MIL/uL (ref 3.87–5.11)
RDW: 17.2 % — ABNORMAL HIGH (ref 11.5–15.5)
WBC: 15.3 10*3/uL — ABNORMAL HIGH (ref 4.0–10.5)
nRBC: 0 % (ref 0.0–0.2)

## 2018-09-14 LAB — URINALYSIS, COMPLETE (UACMP) WITH MICROSCOPIC
Bacteria, UA: NONE SEEN
Bilirubin Urine: NEGATIVE
Glucose, UA: NEGATIVE mg/dL
Hgb urine dipstick: NEGATIVE
Ketones, ur: NEGATIVE mg/dL
Leukocytes,Ua: NEGATIVE
Nitrite: NEGATIVE
Protein, ur: NEGATIVE mg/dL
Specific Gravity, Urine: 1.005 (ref 1.005–1.030)
pH: 6 (ref 5.0–8.0)

## 2018-09-14 LAB — COMPREHENSIVE METABOLIC PANEL
ALT: 22 U/L (ref 0–44)
AST: 18 U/L (ref 15–41)
Albumin: 3.4 g/dL — ABNORMAL LOW (ref 3.5–5.0)
Alkaline Phosphatase: 67 U/L (ref 38–126)
Anion gap: 11 (ref 5–15)
BUN: 11 mg/dL (ref 6–20)
CO2: 26 mmol/L (ref 22–32)
Calcium: 9.4 mg/dL (ref 8.9–10.3)
Chloride: 102 mmol/L (ref 98–111)
Creatinine, Ser: 0.7 mg/dL (ref 0.44–1.00)
GFR calc Af Amer: >60 mL/min (ref >60)
GFR calc non Af Amer: >60 mL/min (ref >60)
Glucose, Bld: 109 mg/dL — ABNORMAL HIGH (ref 70–99)
Potassium: 4 mmol/L (ref 3.5–5.1)
Sodium: 139 mmol/L (ref 135–145)
Total Bilirubin: 0.2 mg/dL — ABNORMAL LOW (ref 0.3–1.2)
Total Protein: 7.4 g/dL (ref 6.5–8.1)

## 2018-09-15 LAB — URINE CULTURE: Culture: <10000 — AB

## 2018-09-15 LAB — CANCER ANTIGEN 19-9: CA 19-9: 3 U/mL (ref 0–35)

## 2018-09-15 NOTE — Telephone Encounter (Signed)
Last Visit:05/15/2018 Next Visit:10/16/2018   Okay to refill per Dr. Estanislado Pandy

## 2018-09-18 ENCOUNTER — Telehealth: Payer: Self-pay | Admitting: *Deleted

## 2018-09-18 NOTE — Telephone Encounter (Signed)
-----   Message from Heath Lark, MD sent at 09/18/2018  8:03 AM EDT ----- Regarding: back pain She was treated for UTI recently then called she still have non specific back pain Tumor marker is normal and urine culture showed insignificant growth Does she wants Ct to be done this week or wait until August?

## 2018-09-18 NOTE — Telephone Encounter (Signed)
Telephone call to patient- her back pain is improving. She would like to wait to have the CT in August since that is when it is due. She is confident that the pain is a muscle pain. She has returned to work, her activity level has increased. She feels the UTI has cleared up. She reports increased urination but she has also increased water intake. She will call this office if the pain changes or increases.

## 2018-09-19 DIAGNOSIS — H04222 Epiphora due to insufficient drainage, left lacrimal gland: Secondary | ICD-10-CM | POA: Diagnosis not present

## 2018-09-19 DIAGNOSIS — H0231 Blepharochalasis right upper eyelid: Secondary | ICD-10-CM | POA: Diagnosis not present

## 2018-09-19 DIAGNOSIS — H04552 Acquired stenosis of left nasolacrimal duct: Secondary | ICD-10-CM | POA: Diagnosis not present

## 2018-09-19 DIAGNOSIS — H0234 Blepharochalasis left upper eyelid: Secondary | ICD-10-CM | POA: Diagnosis not present

## 2018-09-19 DIAGNOSIS — H0232 Blepharochalasis right lower eyelid: Secondary | ICD-10-CM | POA: Diagnosis not present

## 2018-09-19 DIAGNOSIS — H0235 Blepharochalasis left lower eyelid: Secondary | ICD-10-CM | POA: Diagnosis not present

## 2018-09-19 DIAGNOSIS — Z1509 Genetic susceptibility to other malignant neoplasm: Secondary | ICD-10-CM | POA: Diagnosis not present

## 2018-09-21 ENCOUNTER — Encounter: Payer: Self-pay | Admitting: Hematology and Oncology

## 2018-09-22 ENCOUNTER — Other Ambulatory Visit: Payer: Self-pay | Admitting: Hematology and Oncology

## 2018-09-22 DIAGNOSIS — C7961 Secondary malignant neoplasm of right ovary: Secondary | ICD-10-CM

## 2018-09-22 DIAGNOSIS — C252 Malignant neoplasm of tail of pancreas: Secondary | ICD-10-CM

## 2018-09-22 DIAGNOSIS — C541 Malignant neoplasm of endometrium: Secondary | ICD-10-CM

## 2018-09-25 ENCOUNTER — Other Ambulatory Visit: Payer: Self-pay | Admitting: Rheumatology

## 2018-09-25 NOTE — Telephone Encounter (Signed)
Last Visit: 05/15/2018 Next Visit: 10/16/2018  Patient is on orencia and MTX.   Okay to refill prednisone 5mg ?

## 2018-09-25 NOTE — Telephone Encounter (Signed)
ok 

## 2018-10-02 ENCOUNTER — Telehealth: Payer: Self-pay | Admitting: Hematology and Oncology

## 2018-10-02 NOTE — Telephone Encounter (Signed)
Scheduled appt per 8/10 sch message - pt aware of appt date and time   

## 2018-10-03 NOTE — Progress Notes (Signed)
Office Visit Note  Patient: Dana Hicks             Date of Birth: 1975-11-13           MRN: [id number]             PCP: [name], MD Referring: No ref. provider found Visit Date: 10/16/2018 Occupation: _0 @  Subjective:  Pain in both knee joints    History of Present Illness: Dana Hicks is a 43 y.o. female with history of seronegative rheumatoid arthritis and osteoarthritis.  She resumed Orencia 125 mg sq every 7 days and  Otrexup 20 mg sq injections every 7 days in May 2020.  She continues to take prednisone 5 mg by mouth daily.  She continues to have chronic pain in bilateral knee joints.  She states the pain is constant.  She continues to walk with a cane.  She has been evaluated by Dr. Lorre Nick and has had x-rays of both knees recently.  She is planning on having a left knee partial replacement after coming off of Xarelto.  She reports that she is following up with Dr. Alvy Bimler tomorrow.  She will be having lab work and a chest and abdomen pelvis CT today.  She is having trapezius muscle tension and tenderness.  She has been taking Robaxin as prescribed.  She has occasional pain and stiffness in bilateral hands especially first thing in the morning.  She denies any joint swelling.  She has been having worsening fatigue recently.  She has been taking Aleve first thing in the morning to help with joint pain and stiffness and then takes Tylenol in the afternoon.  If her pain is severe she will take tramadol.     Activities of Daily Living:  Patient reports joint stiffness all day  Patient Reports nocturnal pain.  Difficulty dressing/grooming: Denies Difficulty climbing stairs: Reports Difficulty getting out of chair: Reports Difficulty using hands for taps, buttons, cutlery, and/or writing: Denies  Review of Systems  Constitutional: Positive for fatigue.  HENT: Negative for mouth sores, mouth dryness and nose dryness.   Eyes: Negative for pain, visual disturbance and  dryness.  Respiratory: Negative for cough, hemoptysis, shortness of breath and difficulty breathing.   Cardiovascular: Negative for chest pain, palpitations, hypertension and swelling in legs/feet.  Gastrointestinal: Negative for blood in stool, constipation and diarrhea.  Endocrine: Negative for increased urination.  Genitourinary: Negative for painful urination.  Musculoskeletal: Positive for arthralgias, joint pain and morning stiffness. Negative for joint swelling, myalgias, muscle weakness, muscle tenderness and myalgias.  Skin: Negative for color change, pallor, rash, hair loss, nodules/bumps, skin tightness, ulcers and sensitivity to sunlight.  Allergic/Immunologic: Negative for susceptible to infections.  Neurological: Negative for dizziness, numbness, headaches and weakness.  Hematological: Negative for swollen glands.  Psychiatric/Behavioral: Positive for sleep disturbance. Negative for depressed mood. The patient is not nervous/anxious.     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   Anterior leg pain, right 11/21/2017  Genetic testing 11/18/2017   Inflammatory arthritis 11/08/2017   Dysuria 11/02/2017   Peripheral neuropathy due to chemotherapy ([place]) 10/22/2017   Bone pain 10/21/2017   Family history of colon cancer    Pancreatic cancer ([place]) 10/17/2017   Family history of uterine cancer 10/17/2017   Sinusitis, chronic  09/15/2017   Malabsorption due to disorder of pancreas 09/15/2017   Endometrial cancer ([place]) 08/31/2017   Secondary malignant neoplasm of right ovary ([place]) 60/11/9321   Pustular folliculitis 55/73/2202   MRSA colonization 05/12/2017   Left foot pain 01/10/2017   Osteoarthritis of both knees 12/01/2016   Class 3 severe obesity due to excess calories without serious comorbidity with body mass index (BMI) of 40.0 to 44.9 in adult ([place]) 10/01/2016   High risk medication use 02/06/2016   Family history of rheumatoid arthritis 02/06/2016   H/O seasonal allergies 02/06/2016   Rheumatoid arthritis of multiple sites with negative rheumatoid factor ([place]) 02/03/2016   History of colon cancer 02/03/2016   HLA B27 (HLA B27 positive) 02/03/2016    Past Medical History:  Diagnosis Date   Allergic rhinitis, seasonal    Cecal cancer ([place]) 2001   Stage II (T3N0)  04-11-2001cecum-partial colectomy and completed chemo 2002   Endometrial cancer ([place]) 08/2017   Stage IA, Grade 1   History of MRSA infection 01/2017   followed by infectious disease center--  recurrent pustular folliculitis   Nephrolithiasis    bilateral nonobstructive calculi per CT 08-18-2017   OA (osteoarthritis)    Ovarian cancer, right ([place]) 08/2017   Stage II Grade 2 Endometrioid   Pancreatic cancer ([place])    Rheumatoid arthritis [facility])    rheumatologist-  dr devenswar-- treated w/ oral prednisone daily and methotrexate injection every 3 wks    Family History  Problem Relation Age of Onset   Arthritis/Rheumatoid Mother    Uterine cancer Mother 12   Allergic rhinitis Father    Heart disease Father    Drug abuse Son    Other Maternal Aunt        Unknown GYN CA   Colon cancer Maternal Uncle 68   Diabetes Maternal Grandmother    Cancer Maternal Grandfather        d. 20s of liver/lung cancer   Angioedema Neg Hx    Asthma Neg Hx    Atopy Neg Hx    Eczema Neg Hx    Immunodeficiency Neg Hx      Urticaria Neg Hx    Past Surgical History:  Procedure Laterality Date   BREAST LUMPECTOMY WITH RADIOACTIVE SEED LOCALIZATION Left 06/28/2014   Benign Procedure: RADIOACTIVE SEED LOCALIZATION LEFT BREAST LUMPECTOMY;  Surgeon: Excell Seltzer, MD;  Location: [facility];  Service: General;  Laterality: Left;   COLONOSCOPY  06/19/14   EYE SURGERY Left    plug in tear duct   IR IMAGING GUIDED PORT INSERTION  09/26/2017   KNEE ARTHROSCOPY     PANCREATECTOMY  11/25/2017   PORT A CATH REVISION  2001   in and out   RIGHT COLECTOMY  06/02/2009   cecum cancer   ROBOTIC ASSISTED TOTAL HYSTERECTOMY WITH BILATERAL SALPINGO OOPHERECTOMY Bilateral 08/30/2017   Procedure: XI ROBOTIC ASSISTED TOTAL  HYSTERECTOMY BILATERAL  SALPINGO OOPHORECTOMY; LYSIS OF ADHESIONS;  Surgeon: Isabel Caprice, MD;  Location: [facility];  Service: Gynecology;  Laterality: Bilateral;   SPLENECTOMY, PARTIAL  11/25/2017   Social History   Social History Narrative   Not on file   Immunization History  Administered Date(s)  Administered   HiB (PRP-T) 12/04/2017   Influenza,inj,Quad PF,6+ Mos 02/18/2017, 12/04/2017   Meningococcal B Recombinant 12/04/2017   Meningococcal Conjugate 12/04/2017   Pneumococcal Conjugate-13 12/04/2017     Objective: Vital Signs: BP (!) 138/92 (BP Location: Left Wrist, Patient Position: Sitting, Cuff Size: Normal)    Pulse 78    Resp 14    Ht _0  (1.575 m)    Wt 201 lb 6.4 oz (91.4 kg)    LMP 08/30/2017    BMI 36.84 kg/m    Physical Exam Vitals signs and nursing note reviewed.  Constitutional:      Appearance: She is well-developed.  HENT:     Head: Normocephalic and atraumatic.  Eyes:     Conjunctiva/sclera: Conjunctivae normal.  Neck:     Musculoskeletal: Normal range of motion.  Cardiovascular:     Rate and Rhythm: Normal rate and regular rhythm.     Heart sounds: Normal heart sounds.  Pulmonary:     Effort: Pulmonary effort is normal.     Breath  sounds: Normal breath sounds.  Abdominal:     General: Bowel sounds are normal.     Palpations: Abdomen is soft.  Lymphadenopathy:     Cervical: No cervical adenopathy.  Skin:    General: Skin is warm and dry.     Capillary Refill: Capillary refill takes less than 2 seconds.  Neurological:     Mental Status: She is alert and oriented to person, place, and time.  Psychiatric:        Behavior: Behavior normal.     Musculoskeletal Exam: C-spine, thoracic spine, and lumbar spine good ROM.  Tenderness and tension of the left trapezius muscle.  Shoulder joints, elbow joints, wrist joints, MCPs, PIPs, and DIPs good ROM with no synovitis .  Hip joints good ROM. Warmth of both knee joints but no effusion.  Ankle joints good ROM with no warmth or effusion.  No tenderness of MTPs.   CDAI Exam: CDAI Score: -- Patient Global: --; Provider Global: -- Swollen: --; Tender: -- Joint Exam   No joint exam has been documented for this visit   There is currently no information documented on the homunculus. Go to the Rheumatology activity and complete the homunculus joint exam.  Investigation: No additional findings.  Imaging: No results found.  Recent Labs: Lab Results  Component Value Date   WBC 15.3 (H) 09/14/2018   HGB 11.0 (L) 09/14/2018   PLT 639 (H) 09/14/2018   NA 139 09/14/2018   K 4.0 09/14/2018   CL 102 09/14/2018   CO2 26 09/14/2018   GLUCOSE 109 (H) 09/14/2018   BUN 11 09/14/2018   CREATININE 0.70 09/14/2018   BILITOT 0.2 (L) 09/14/2018   ALKPHOS 67 09/14/2018   AST 18 09/14/2018   ALT 22 09/14/2018   PROT 7.4 09/14/2018   ALBUMIN 3.4 (L) 09/14/2018   CALCIUM 9.4 09/14/2018   GFRAA >60 09/14/2018   QFTBGOLDPLUS NEGATIVE 06/27/2018    Speciality Comments: No specialty comments available.  Procedures:  Trigger Point Inj  Date/Time: 10/16/2018 9:26 AM Performed by: Ofilia Neas, PA-C Authorized by: Ofilia Neas, PA-C   Consent Given by:  Patient Site marked:  the procedure site was marked   Timeout: prior to procedure the correct patient, procedure, and site was verified   Indications:  Pain Total # of Trigger Points:  1 (Left trapezius trigger poing injection) Location: neck   Needle Size:  27 G Approach:  Dorsal Medications #1:  0.5 mL lidocaine 1 %; 10 mg triamcinolone acetonide 40 MG/ML Patient tolerance:  Patient tolerated the procedure well with no immediate complications   Allergies: Codeine, Penicillins, and Bactrim [sulfamethoxazole-trimethoprim]        Assessment / Plan:     Visit Diagnoses: Rheumatoid arthritis of multiple sites with negative rheumatoid factor ([place]): She has no synovitis on exam.  She has had any recent rheumatoid arthritis flares.  She resumed taking Orencia 125 mg subcutaneous injections every 7 days and Otrexup 20 mg subcutaneous injections once weekly.  She continues take prednisone 5 mg by mouth daily.  She has chronic pain in bilateral knee joints.  She has warmth of both knees but no effusion on exam.  She is planning to have the left knee partially replaced by Dr. Ronnie Derby in the upcoming months.  She will continue on Orencia, methotrexate, and prednisone as prescribed.  She is aware that she will have to hold Orencia 1 month prior to surgery and methotrexate 2 weeks prior.  She advised notify us if she develops increased joint pain or joint swelling.  She will follow-up in the office in 5 months.  High risk medication use -Resumed Orencia 125 mg every 7 days and Otrexup 20 mg every 7 days in May 2020.  She continues to take prednisone 5 mg by mouth daily.  Last TB negative on 06/27/2018 and will monitor yearly.  Most recent CBC/CMP within normal limits except for mild anemia and elevated WBC count but stable on 09/14/2018.  WBC count elevated due to prednisone use.  Due for CBC/CMP in October and will monitor every 3 months. Standing orders placed. She received the flu vaccine and Prevnar 13 in October.   - Plan: CBC with  Differential/Platelet, COMPLETE METABOLIC PANEL WITH GFR  HLA B27 (HLA B27 positive)  Primary osteoarthritis of both knees: She has chronic pain in bilateral knee joints.  She has warmth of both knees but no effusion on exam.  She is been evaluated by Dr. Ronnie Derby recently who obtained x-rays of both knee joints.  She is planning to have a left knee partial replacement after coming off of Xarelto.  She will be following up with Dr. Alvy Bimler tomorrow.  She was advised to hold Orencia for 1 month prior to surgery and methotrexate 2 weeks prior to surgery.  Trapezius muscle spasm: She presents today with left trapezius muscle tension and muscle tenderness.  She requested a trigger point injection today.  She tolerated the procedure well.  The procedure note was completed above.  She continues to take Robaxin as needed for muscle spasms.  Other medical conditions are listed as follows:  Family history of rheumatoid arthritis  Status post splenectomy  Secondary malignant neoplasm of right ovary ([place]) - Dr. Alvy Bimler  Pancreatic neoplasm - surgical partial resection on 11/25/17.  She also had a splenectomy  History of colon cancer - Right hemicolectomy in 2001.    Orders: Orders Placed This Encounter  Procedures   Trigger Point Inj   CBC with Differential/Platelet   COMPLETE METABOLIC PANEL WITH GFR   No orders of the defined types were placed in this encounter.   Face-to-face time spent with patient was 30  minutes. Greater than 50% of time was spent in counseling and coordination of care.  Follow-Up Instructions: Return in about 5 months (around 03/18/2019) for Rheumatoid arthritis, Osteoarthritis.   Ofilia Neas, PA-C  Note - This record has been created using Dragon software.  Chart creation errors have  been sought, but may not always  have been located. Such creation errors do not reflect on  the standard of medical care.

## 2018-10-05 ENCOUNTER — Other Ambulatory Visit: Payer: BLUE CROSS/BLUE SHIELD

## 2018-10-05 ENCOUNTER — Telehealth: Payer: Self-pay

## 2018-10-05 NOTE — Telephone Encounter (Signed)
Spoke with pt by phone to confirm appt for CT scan on 8/24.  Pt states she will come to pick up contrast prior to that day.

## 2018-10-06 ENCOUNTER — Ambulatory Visit: Payer: BLUE CROSS/BLUE SHIELD | Admitting: Hematology and Oncology

## 2018-10-10 ENCOUNTER — Encounter: Payer: Self-pay | Admitting: Hematology and Oncology

## 2018-10-11 ENCOUNTER — Telehealth: Payer: Self-pay

## 2018-10-11 ENCOUNTER — Other Ambulatory Visit: Payer: Self-pay | Admitting: Gastroenterology

## 2018-10-11 DIAGNOSIS — Z1509 Genetic susceptibility to other malignant neoplasm: Secondary | ICD-10-CM

## 2018-10-11 DIAGNOSIS — Z1211 Encounter for screening for malignant neoplasm of colon: Secondary | ICD-10-CM

## 2018-10-11 MED ORDER — PEG 3350-KCL-NA BICARB-NACL 420 G PO SOLR: 4000.0000 mL | ORAL | 0 refills | Status: DC

## 2018-10-11 NOTE — Telephone Encounter (Signed)
I spoke to patient this morning to follow up on scheduling her colonoscopy and Xarelto status. She should complete a 6 month blood thinner course uninterrupted. Patient has a CT scheduled for 10/17/18 and f/u appointment with Dr Alvy Bimler the next day to discuss next plan of care and coming off Xarelto. Colonoscopy has been scheduled for 11/14/18 with being off Xarelto. If plans change after f/u appointment, patient will contact our office to cancel colonoscopy until cleared.

## 2018-10-16 ENCOUNTER — Ambulatory Visit (HOSPITAL_COMMUNITY)
Admission: RE | Admit: 2018-10-16 | Discharge: 2018-10-16 | Disposition: A | Discharge status: Home / Self Care | Payer: BC Managed Care – PPO | Source: Ambulatory Visit | Attending: Hematology and Oncology | Admitting: Hematology and Oncology

## 2018-10-16 ENCOUNTER — Encounter: Payer: Self-pay | Admitting: Physician Assistant

## 2018-10-16 ENCOUNTER — Other Ambulatory Visit: Payer: Self-pay | Admitting: Rheumatology

## 2018-10-16 ENCOUNTER — Inpatient Hospital Stay: Payer: BC Managed Care – PPO | Attending: Hematology and Oncology

## 2018-10-16 ENCOUNTER — Ambulatory Visit (INDEPENDENT_AMBULATORY_CARE_PROVIDER_SITE_OTHER): Payer: BC Managed Care – PPO | Admitting: Physician Assistant

## 2018-10-16 ENCOUNTER — Inpatient Hospital Stay: Payer: BC Managed Care – PPO

## 2018-10-16 ENCOUNTER — Other Ambulatory Visit: Payer: Self-pay

## 2018-10-16 VITALS — BP 138/92 | HR 78 | Resp 14 | Ht 62.0 in | Wt 201.4 lb

## 2018-10-16 DIAGNOSIS — C7961 Secondary malignant neoplasm of right ovary: Secondary | ICD-10-CM | POA: Insufficient documentation

## 2018-10-16 DIAGNOSIS — D490 Neoplasm of unspecified behavior of digestive system: Secondary | ICD-10-CM | POA: Diagnosis not present

## 2018-10-16 DIAGNOSIS — Z79899 Other long term (current) drug therapy: Secondary | ICD-10-CM | POA: Diagnosis not present

## 2018-10-16 DIAGNOSIS — Z1589 Genetic susceptibility to other disease: Secondary | ICD-10-CM

## 2018-10-16 DIAGNOSIS — M0609 Rheumatoid arthritis without rheumatoid factor, multiple sites: Secondary | ICD-10-CM | POA: Diagnosis not present

## 2018-10-16 DIAGNOSIS — C541 Malignant neoplasm of endometrium: Secondary | ICD-10-CM | POA: Insufficient documentation

## 2018-10-16 DIAGNOSIS — G629 Polyneuropathy, unspecified: Secondary | ICD-10-CM | POA: Diagnosis not present

## 2018-10-16 DIAGNOSIS — C252 Malignant neoplasm of tail of pancreas: Secondary | ICD-10-CM | POA: Diagnosis not present

## 2018-10-16 DIAGNOSIS — Z85038 Personal history of other malignant neoplasm of large intestine: Secondary | ICD-10-CM

## 2018-10-16 DIAGNOSIS — M170 Bilateral primary osteoarthritis of knee: Secondary | ICD-10-CM | POA: Diagnosis not present

## 2018-10-16 DIAGNOSIS — Z1509 Genetic susceptibility to other malignant neoplasm: Secondary | ICD-10-CM | POA: Diagnosis not present

## 2018-10-16 DIAGNOSIS — C259 Malignant neoplasm of pancreas, unspecified: Secondary | ICD-10-CM | POA: Diagnosis not present

## 2018-10-16 DIAGNOSIS — Z90721 Acquired absence of ovaries, unilateral: Secondary | ICD-10-CM | POA: Diagnosis not present

## 2018-10-16 DIAGNOSIS — I2699 Other pulmonary embolism without acute cor pulmonale: Secondary | ICD-10-CM | POA: Insufficient documentation

## 2018-10-16 DIAGNOSIS — Z9081 Acquired absence of spleen: Secondary | ICD-10-CM

## 2018-10-16 DIAGNOSIS — K429 Umbilical hernia without obstruction or gangrene: Secondary | ICD-10-CM | POA: Diagnosis not present

## 2018-10-16 DIAGNOSIS — Z9049 Acquired absence of other specified parts of digestive tract: Secondary | ICD-10-CM | POA: Diagnosis not present

## 2018-10-16 DIAGNOSIS — Z8261 Family history of arthritis: Secondary | ICD-10-CM

## 2018-10-16 DIAGNOSIS — M62838 Other muscle spasm: Secondary | ICD-10-CM | POA: Diagnosis not present

## 2018-10-16 DIAGNOSIS — Z90710 Acquired absence of both cervix and uterus: Secondary | ICD-10-CM | POA: Diagnosis not present

## 2018-10-16 DIAGNOSIS — M47814 Spondylosis without myelopathy or radiculopathy, thoracic region: Secondary | ICD-10-CM | POA: Diagnosis not present

## 2018-10-16 LAB — CBC WITH DIFFERENTIAL/PLATELET
Abs Immature Granulocytes: 0.04 10*3/uL (ref 0.00–0.07)
Basophils Absolute: 0.1 10*3/uL (ref 0.0–0.1)
Basophils Relative: 1 %
Eosinophils Absolute: 0 10*3/uL (ref 0.0–0.5)
Eosinophils Relative: 0 %
HCT: 34.7 % — ABNORMAL LOW (ref 36.0–46.0)
Hemoglobin: 10.8 g/dL — ABNORMAL LOW (ref 12.0–15.0)
Immature Granulocytes: 0 %
Lymphocytes Relative: 13 %
Lymphs Abs: 1.5 10*3/uL (ref 0.7–4.0)
MCH: 26.2 pg (ref 26.0–34.0)
MCHC: 31.1 g/dL (ref 30.0–36.0)
MCV: 84 fL (ref 80.0–100.0)
Monocytes Absolute: 0.5 10*3/uL (ref 0.1–1.0)
Monocytes Relative: 5 %
Neutro Abs: 9.5 10*3/uL — ABNORMAL HIGH (ref 1.7–7.7)
Neutrophils Relative %: 81 %
Platelets: 562 10*3/uL — ABNORMAL HIGH (ref 150–400)
RBC: 4.13 MIL/uL (ref 3.87–5.11)
RDW: 20.2 % — ABNORMAL HIGH (ref 11.5–15.5)
WBC: 11.7 10*3/uL — ABNORMAL HIGH (ref 4.0–10.5)
nRBC: 0 % (ref 0.0–0.2)

## 2018-10-16 LAB — COMPREHENSIVE METABOLIC PANEL
ALT: 26 U/L (ref 0–44)
AST: 21 U/L (ref 15–41)
Albumin: 3.7 g/dL (ref 3.5–5.0)
Alkaline Phosphatase: 78 U/L (ref 38–126)
Anion gap: 11 (ref 5–15)
BUN: 12 mg/dL (ref 6–20)
CO2: 25 mmol/L (ref 22–32)
Calcium: 9.9 mg/dL (ref 8.9–10.3)
Chloride: 103 mmol/L (ref 98–111)
Creatinine, Ser: 0.65 mg/dL (ref 0.44–1.00)
GFR calc Af Amer: >60 mL/min (ref >60)
GFR calc non Af Amer: >60 mL/min (ref >60)
Glucose, Bld: 114 mg/dL — ABNORMAL HIGH (ref 70–99)
Potassium: 4.3 mmol/L (ref 3.5–5.1)
Sodium: 139 mmol/L (ref 135–145)
Total Bilirubin: 0.2 mg/dL — ABNORMAL LOW (ref 0.3–1.2)
Total Protein: 7.9 g/dL (ref 6.5–8.1)

## 2018-10-16 MED ORDER — HEPARIN SOD (PORK) LOCK FLUSH 100 UNIT/ML IV SOLN
500.0000 [IU] | Freq: Once | INTRAVENOUS | Status: AC
  Administered 2018-10-16: 500 [IU] via INTRAVENOUS

## 2018-10-16 MED ORDER — TRIAMCINOLONE ACETONIDE 40 MG/ML IJ SUSP
10.0000 mg | INTRAMUSCULAR | Status: AC | PRN
  Administered 2018-10-16: 10 mg via INTRAMUSCULAR

## 2018-10-16 MED ORDER — SODIUM CHLORIDE (PF) 0.9 % IJ SOLN
INTRAMUSCULAR | Status: AC
  Filled 2018-10-16: qty 50

## 2018-10-16 MED ORDER — SODIUM CHLORIDE 0.9% FLUSH
10.0000 mL | Freq: Once | INTRAVENOUS | Status: AC | PRN
  Administered 2018-10-16: 13:00:00 10 mL
  Filled 2018-10-16: qty 10

## 2018-10-16 MED ORDER — HEPARIN SOD (PORK) LOCK FLUSH 100 UNIT/ML IV SOLN
INTRAVENOUS | Status: AC
  Filled 2018-10-16: qty 5

## 2018-10-16 MED ORDER — IOHEXOL 300 MG/ML  SOLN
100.0000 mL | Freq: Once | INTRAMUSCULAR | Status: AC | PRN
  Administered 2018-10-16: 100 mL via INTRAVENOUS

## 2018-10-16 MED ORDER — LIDOCAINE HCL 1 % IJ SOLN
0.5000 mL | INTRAMUSCULAR | Status: AC | PRN
  Administered 2018-10-16: .5 mL

## 2018-10-16 NOTE — Patient Instructions (Signed)

## 2018-10-16 NOTE — Patient Instructions (Signed)
Standing Labs We placed an order today for your standing lab work.    Please come back and get your standing labs in October and every 3 months   We have open lab daily Monday through Thursday from 8:30-12:30 PM and 1:30-4:30 PM and Friday from 8:30-12:30 PM and 1:30 -4:00 PM at the office of Dr. Shaili Deveshwar.   You may experience shorter wait times on Monday and Friday afternoons. The office is located at [address], [address], [place], [place] [address] No appointment is necessary.   Labs are drawn by [facility].  You may receive a bill from [facility] for your lab work.  If you wish to have your labs drawn at another location, please call the office 24 hours in advance to send orders.  If you have any questions regarding directions or hours of operation,  please call [phone].   Just as a reminder please drink plenty of water prior to coming for your lab work. Thanks!   

## 2018-10-17 ENCOUNTER — Other Ambulatory Visit: Payer: Self-pay

## 2018-10-17 ENCOUNTER — Inpatient Hospital Stay (HOSPITAL_BASED_OUTPATIENT_CLINIC_OR_DEPARTMENT_OTHER): Payer: BC Managed Care – PPO | Admitting: Hematology and Oncology

## 2018-10-17 VITALS — BP 146/77 | HR 65 | Temp 98.0°F | Resp 18 | Ht 62.0 in | Wt 205.6 lb

## 2018-10-17 DIAGNOSIS — M0609 Rheumatoid arthritis without rheumatoid factor, multiple sites: Secondary | ICD-10-CM | POA: Diagnosis not present

## 2018-10-17 DIAGNOSIS — M170 Bilateral primary osteoarthritis of knee: Secondary | ICD-10-CM

## 2018-10-17 DIAGNOSIS — C252 Malignant neoplasm of tail of pancreas: Secondary | ICD-10-CM

## 2018-10-17 DIAGNOSIS — C541 Malignant neoplasm of endometrium: Secondary | ICD-10-CM

## 2018-10-17 DIAGNOSIS — Z1509 Genetic susceptibility to other malignant neoplasm: Secondary | ICD-10-CM

## 2018-10-17 DIAGNOSIS — C7961 Secondary malignant neoplasm of right ovary: Secondary | ICD-10-CM

## 2018-10-17 DIAGNOSIS — R5381 Other malaise: Secondary | ICD-10-CM | POA: Diagnosis not present

## 2018-10-17 DIAGNOSIS — I2699 Other pulmonary embolism without acute cor pulmonale: Secondary | ICD-10-CM | POA: Diagnosis not present

## 2018-10-17 DIAGNOSIS — G629 Polyneuropathy, unspecified: Secondary | ICD-10-CM | POA: Diagnosis not present

## 2018-10-17 LAB — CANCER ANTIGEN 19-9: CA 19-9: 5 U/mL (ref 0–35)

## 2018-10-17 MED ORDER — TRAMADOL HCL 50 MG PO TABS: 50.0000 mg | ORAL_TABLET | Freq: Four times a day (QID) | ORAL | 0 refills | Status: DC | PRN

## 2018-10-18 ENCOUNTER — Telehealth: Payer: Self-pay

## 2018-10-18 LAB — CA 125: Cancer Antigen (CA) 125: 12.4 U/mL (ref 0.0–38.1)

## 2018-10-18 NOTE — Telephone Encounter (Signed)
-----   Message from Heath Lark, MD sent at 10/18/2018  9:36 AM EDT ----- Regarding: CA-125 is normal, let her know

## 2018-10-18 NOTE — Telephone Encounter (Signed)
Called pt and gave below msg  

## 2018-10-19 ENCOUNTER — Encounter: Payer: Self-pay | Admitting: Hematology and Oncology

## 2018-10-19 NOTE — Assessment & Plan Note (Signed)
She will consult with GI service for repeat colonoscopy

## 2018-10-19 NOTE — Assessment & Plan Note (Signed)
She has no signs of cancer recurrence Tumor marker is stable Continue tumor marker monitoring every 3 months 

## 2018-10-19 NOTE — Assessment & Plan Note (Signed)
I recommend physical therapy and rehab for deconditioning and neuropathy

## 2018-10-19 NOTE — Assessment & Plan Note (Signed)
I have reviewed CT imaging with the patient She has no signs of cancer recurrence Tumor marker stable I plan to repeat CT again in 3 months

## 2018-10-19 NOTE — Progress Notes (Signed)
[place] OFFICE PROGRESS NOTE  Patient Care Team: [name], MD as PCP - General (Family Medicine)  ASSESSMENT & PLAN:  Pancreatic cancer [facility]) I have reviewed CT imaging with the patient She has no signs of cancer recurrence Tumor marker stable I plan to repeat CT again in 3 months  Secondary malignant neoplasm of right ovary [facility]) She has no signs of cancer recurrence Tumor marker is stable Continue tumor marker monitoring every 3 months  Lynch syndrome She will consult with GI service for repeat colonoscopy  Physical debility I recommend physical therapy and rehab for deconditioning and neuropathy  Osteoarthritis of both knees She has severe osteoarthritis I refill her prescription tramadol  Pulmonary embolism ([place]) She will discontinue anticoagulation therapy once her current prescription runs out   Orders Placed This Encounter  Procedures  . CT ABDOMEN PELVIS W CONTRAST    Standing Status:   Future    Standing Expiration Date:   10/17/2019    Order Specific Question:   If indicated for the ordered procedure, I authorize the administration of contrast media per Radiology protocol    Answer:   Yes    Order Specific Question:   Preferred imaging location?    Answer:   [facility]    Order Specific Question:   Radiology Contrast Protocol - do NOT remove file path    Answer:   [email or web address]    Order Specific Question:   Is patient pregnant?    Answer:   No  . CA 125    Standing Status:   Standing    Number of Occurrences:   9    Standing Expiration Date:   10/19/2019  . Ambulatory referral to Physical Therapy    Referral Priority:   Routine    Referral Type:   Physical Medicine    Referral Reason:   Specialty Services Required    Requested Specialty:   Physical Therapy    Number of Visits Requested:   1    INTERVAL HISTORY: Please see below for problem oriented charting. 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 Mixed attenuation mass emanates from the right adnexa measuring 10.8 x 8.0 cm very suspicious for right ovarian carcinoma. 2. Abnormality of the tail of the pancreas may be due to mild pancreatitis and small pseudocyst formation, but neoplasm cannot be excluded. 3. Small amount of ascites within abdomen and pelvis. 4. Small nonobstructing renal calculi bilaterally.    08/30/2017 Pathology Results   1. Uterus and cervix, with left fallopian tube - ENDOMETRIOID ADENOCARCINOMA, FIGO GRADE I, ARISING IN A BACKGROUND OF DIFFUSE COMPLEX ATYPICAL HYPERPLASIA. - CARCINOMA INVADES FOR OF DEPTH OF 0.2 CM WHERE THICKNESS OF MYOMETRIAL WALL IS 2.1 CM. - ALL RESECTION MARGINS ARE NEGATIVE FOR CARCINOMA. - NEGATIVE FOR LYMPHOVASCULAR OR PERINEURAL INVASION. - CERVICAL STROMA IS NOT INVOLVED. - SEE ONCOLOGY TABLE. - SEE NOTE 2. Ovary and fallopian tube, right - PRIMARY OVARIAN ENDOMETRIOID ADENOCARCINOMA, FIGO GRADE II, 12 CM. - THE OVARIAN SURFACE IS FOCALLY INVOLVED BY CARCINOMA. - NEGATIVE FOR LYMPHOVASCULAR INVASION. - BENIGN UNREMARKABLE FALLOPIAN TUBE, NEGATIVE FOR CARCINOMA. - SEE  ONCOLOGY TABLE. - SEE NOTE 3. Cul-de-sac biopsy - METASTATIC ADENOCARCINOMA, MOST CONSISTENT WITH PRIMARY OVARIAN ENDOMETRIOID ADENOCARCINOMA. 4. Ovary, left - BENIGN UNREMARKABLE OVARY, NEGATIVE FOR MALIGNANCY. Microscopic Comment 1. UTERUS, CARCINOMA OR CARCINOSARCOMA Procedure: Total hysterectomy with bilateral salpingo-oophorectomy. Histologic type: Endometrioid  adenocarcinoma. Histologic Grade: FIGO Grade I Myometrial invasion: Depth of invasion: 2 mm Myometrial thickness: 21 mm Uterine Serosa Involvement: Not identified Cervical stromal involvement: Not identified Extent of involvement of other organs: Not applicable Lymphovascular invasion: Not identified Regional Lymph Nodes: Examined: 0 Sentinel 0 Non-sentinel 0 Total Tumor block for ancillary studies: 1H MMR / MSI testing: Pending Pathologic Stage Classification (pTNM, AJCC 8th edition): pT1a, pNX (v4.1.0.0) 2. OVARY or FALLOPIAN TUBE or PRIMARY PERITONEUM: Procedure: Salpingo-oophorectomy Specimen Integrity: Intact Tumor Site: Right ovary Ovarian Surface Involvement (required only if applicable): Focally involved by carcinoma Fallopian Tube Surface Involvement (required only if applicable): Not identified Tumor Size: 12 cm Histologic Type: Endometrioid adenocarcinoma Histologic Grade: Grade II Implants (required for advanced stage serous/seromucinous borderline tumors only): Not applicable Other Tissue/ Organ Involvement: Cul de sac biopsy involved by tumor Largest Extrapelvic Peritoneal Focus (required only if applicable): Not applicable Peritoneal/Ascitic Fluid: Negative for carcinoma (case # [id number]) Treatment Effect (required only for high-grade serous carcinomas): Not applicable Regional Lymph Nodes: No lymph nodes submitted or found Number of Lymph Nodes Examined: 0 Pathologic Stage Classification (pTNM, AJCC 8th Edition): pT2b, pN0 Representative Tumor Block: 2B and 2E 1. Molecular study for microsatellite instability and immunohistochemical stains for MMR-related proteins are pending and will be reported in an addendum. 2. Immunohistochemical stain show that the ovarian tumor is positive for CK7 and PAX8 (both diffuse), CDX2 (patchy and weak); and negative for CK20. This immunoprofile is consistent with the above diagnosis. Dr. Lyndon Code has reviewed this case and concurs  with the above diagnosis. Molecular study for microsatellite instability and immunohistochemical stains for MMR-related proteins are pending and will be reported in an addendum   08/30/2017 Genetic Testing   Patient has genetic testing done for MSI  Results revealed patient has the following mutation(s): loss of [facility] 2   08/30/2017 Surgery   Surgeon: Mart Piggs, MD Pre-operative Diagnosis:  1. Adnexal mass 2. Abnormal uterine bleeding 3. H/o Cecal CA  Post-operative Diagnosis:  1. Adhesive disease post colon resection 2. Endometrial cancer NOS 3. Adenocarcinoma unknown origin, right ovary, suspicious for GI primary  Operation:  1. Lysis of adhesions ~30 minutes 2. Robotic-assisted laparoscopic total hysterectomy with right salpingo-oophorectomy and left salpingectomy 3. Left oophorectomy (RA-laparoscopic) 4. Pelvic washings  Findings: Adhesions of omentum to anterior abdominal wall. Enlarged cystic right ovary ~10cm. Uterus had small nodules on serosa near where right adnexa was intimate with the surface. No obvious intraoperative rupture of cyst, although in 2 areas the wall was thin and one of these areas had some bleeding. Slight scarring of left bladder dome to LUS/cervix. Uterus on frozen section c/w hyperplasia and a small focus of endometrial CA - no myo invasion, <2cm in size. Frozen section on the right adnexa was carcinoma, met from colon or possibly Gyn, favor GI primary, defer to permanent. Left ovary was WNL.     09/15/2017 Cancer Staging   Staging form: Corpus Uteri - Carcinoma and Carcinosarcoma, AJCC 8th Edition - Pathologic: FIGO Stage IA (pT1a, pN0, cM0) - Signed by Heath Lark, MD on 09/15/2017   09/26/2017 Procedure   Successful placement of a right internal jugular approach power injectable Port-A-Cath. The catheter is ready for immediate use.   11/07/2017  Imaging   1. Since 08/18/2017, similar to slight decrease in size of a pancreatic body/tail junction  lesion. Cross modality comparison relative to 09/16/2017 MRI is also grossly similar. 2. No evidence of metastatic disease. 3. Aortic Atherosclerosis (ICD10-I70.0).  4. Left nephrolithiasis.   11/18/2017 Genetic Testing   MSH2 c.2237dupT pathogenic mutation identified in the CancerNext panel.  The CancerNext gene panel offered by [employer] includes sequencing and rearrangement analysis for the following 34 genes:   APC, ATM, BARD1, BMPR1A, BRCA1, BRCA2, BRIP1, CDH1, CDK4, CDKN2A, CHEK2, DICER1, HOXB13, EPCAM, GREM1, MLH1, MRE11A, MSH2, MSH6, MUTYH, NBN, NF1, PALB2, PMS2, POLD1, POLE, PTEN, RAD50, RAD51C, RAD51D, SMAD4, SMARCA4, STK11, and TP53.  The report date is November 18, 2017.  MSH2 [id number] pathogenic mutation identified on somatic testing.  These results are consistent with a diagnosis of Lynch syndrome.   Secondary malignant neoplasm of right ovary ([place])  08/23/2017 Tumor Marker   Patient's tumor was tested for the following markers: CA-125 Results of the tumor marker test revealed 139.8   08/31/2017 Initial Diagnosis   Secondary malignant neoplasm of right ovary ([place])   09/15/2017 Cancer Staging   Staging form: Ovary, Fallopian Tube, and Primary Peritoneal Carcinoma, AJCC 8th Edition - Pathologic: Stage IIB (pT2b, pN0, cM0) - Signed by Heath Lark, MD on 09/15/2017   09/27/2017 Imaging   No evidence of metastatic disease or other acute findings within the thorax.  4 cm low-attenuation mass in pancreatic tail, highly suspicious for pancreatic carcinoma. This is caused splenic vein thrombosis, with new venous collaterals in the left upper quadrant. Consider endoscopic ultrasound with FNA for tissue diagnosis.  Stable benign hepatic hemangioma.    09/27/2017 Tumor Marker   Patient's tumor was tested for the following markers: CA-125 Results of the tumor marker test revealed 39.4   11/02/2017 Tumor Marker   Patient's tumor was tested for the following markers:  CA-125 Results of the tumor marker test revealed 19   11/07/2017 Tumor Marker   Patient's tumor was tested for the following markers: CA-125 Results of the tumor marker test revealed 18.3   11/18/2017 Genetic Testing   MSH2 c.2237dupT pathogenic mutation identified in the CancerNext panel.  The CancerNext gene panel offered by [employer] includes sequencing and rearrangement analysis for the following 34 genes:   APC, ATM, BARD1, BMPR1A, BRCA1, BRCA2, BRIP1, CDH1, CDK4, CDKN2A, CHEK2, DICER1, HOXB13, EPCAM, GREM1, MLH1, MRE11A, MSH2, MSH6, MUTYH, NBN, NF1, PALB2, PMS2, POLD1, POLE, PTEN, RAD50, RAD51C, RAD51D, SMAD4, SMARCA4, STK11, and TP53.  The report date is November 18, 2017.  MSH2 [id number] pathogenic mutation identified on somatic testing.  These results are consistent with a diagnosis of Lynch syndrome.   12/15/2017 Tumor Marker   Patient's tumor was tested for the following markers: CA-125 Results of the tumor marker test revealed 57.3   01/16/2018 Tumor Marker   Patient's tumor was tested for the following markers: CA-125 Results of the tumor marker test revealed 24.2   04/10/2018 Tumor Marker   Patient's tumor was tested for the following markers: CA-125 Results of the tumor marker test revealed 18.2   07/12/2018 Tumor Marker   Patient's tumor was tested for the following markers: CA-125 Results of the tumor marker test revealed 11.8   10/16/2018 Tumor Marker   Patient's tumor was tested for the following markers: CA125 Results of the tumor marker test revealed 12.4.   Pancreatic cancer ([place])  10/13/2017 Pathology Results   Pancreas tail mass, endoscopic ultrasound-guided, fine needle aspiration II (smears and cell  block): Adenocarcinoma   11/18/2017 Genetic Testing   MSH2 c.2237dupT pathogenic mutation identified in the CancerNext panel.  The CancerNext gene panel offered by [employer] includes sequencing and rearrangement analysis for the  following 34 genes:   APC, ATM, BARD1, BMPR1A, BRCA1, BRCA2, BRIP1, CDH1, CDK4, CDKN2A, CHEK2, DICER1, HOXB13, EPCAM, GREM1, MLH1, MRE11A, MSH2, MSH6, MUTYH, NBN, NF1, PALB2, PMS2, POLD1, POLE, PTEN, RAD50, RAD51C, RAD51D, SMAD4, SMARCA4, STK11, and TP53.  The report date is November 18, 2017.  MSH2 [id number] pathogenic mutation identified on somatic testing.  These results are consistent with a diagnosis of Lynch syndrome.   11/24/2017 Pathology Results   A. "TAIL OF PANCREAS AND SPLEEN", DISTAL PANCREATECTOMY AND SPLENECTOMY: Invasive ductal adenocarcinoma, moderately to poorly differentiated with focal signet ring cell features, of pancreas (distal).   The carcinoma is 2.5 cm in greatest dimension grossly.   Treatment effects present in the form of fibrosis (50%). No lymphovascular or definite perineural invasion identified. All surgical margins are negative for tumor or high-grade dysplasia. Adjacent mucinous neoplasm, most consistent with Intraductal papillary mucinous neoplasm (IPMN) with low-grade dysplasia.   Uninvolved pancreas show atrophy and focal acute inflammation.   Twelve benign lymph nodes (0/12). Spleen with no significant histopathologic abnormalities.  PROCEDURE: distal pancreatectomy and splenectomy TUMOR SITE: distal pancreas TUMOR SIZE:  GREATEST DIMENSION: 2.5 cm  ADDITIONAL DIMENSIONS:  x  cm HISTOLOGIC TYPE: ductal adenocarcinoma HISTOLOGIC GRADE: grade 3 TUMOR EXTENSION: peripancreatic soft tissue MARGINS: negative for tumor TREATMENT EFFECT: treatment effects present in the form of fibrosis (50%). LYMPHOVASCULAR INVASION: not identified PERINEURAL INVASION: no definite evidence REGIONAL LYMPH NODES:   NUMBER OF LYMPH NODES INVOLVED: 0   NUMBER OF LYMPH NODES EXAMINED: 12 PATHOLOGIC STAGE CLASSIFICATION (pTNM, AJCC 8th Ed): ypT2, ypN0 DISTANT METASTASIS (pM): pMx ADDITIONAL  PATHOLOGIC FINDINGS: mucinous neoplasm, most consistent with intraductal papillary mucinous neoplasm (IPMN) with low-grade dysplasia, is identified adjacent to the main tumor.   11/24/2017 Cancer Staging   Staging form: Exocrine Pancreas, AJCC 8th Edition - Pathologic stage from 11/24/2017: Stage IB (pT2, pN0, cM0) - Signed by Truitt Merle, MD on 01/03/2018   11/25/2017 Surgery   She had surgery at [facility] 1. Exploratory Laparotomy 2. Distal Pancreatectomy and Splenectomy 3. Intraoperative Ultrasound 4. Open Cholecystectomy    12/15/2017 Cancer Staging   Staging form: Exocrine Pancreas, AJCC 8th Edition - Clinical: Stage IB (cT2, cN0, cM0) - Signed by Heath Lark, MD on 12/15/2017   12/28/2017 Imaging   12/28/2017 CT Abdomen  IMPRESSION: 1. Postoperative findings from recent partial pancreatectomy including a 21 cubic cm fluid collection along the pancreatic resection margin which could represent early pseudocyst. 2. Nodularity along the lateral limb of the left adrenal gland could also be postoperative but merit surveillance, as the pancreatic lesion was in close proximity to this adrenal gland on the prior CT of 11/07/2017. 3. Asymmetric fullness inferiorly in the left breast. The patient has a history of prior left breast procedures, correlation with mammography is recommended. 4. Other imaging findings of potential clinical significance: Stable hemangioma in the left hepatic lobe. Splenectomy. Aortic Atherosclerosis (ICD10-I70.0). Right hemicolectomy. Small focus of fat necrosis in the right anterior abdominal wall subcutaneous tissues near the laparotomy site. Bilateral nonobstructive nephrolithiasis.   01/02/2018 Tumor Marker   Patient's tumor was tested for the following markers: CA-19-9 Results of the tumor marker test revealed 5   01/03/2018 - 06/05/2018 Chemotherapy   She received modified dose FOLFIRINOX   04/10/2018 Imaging   1. Distal pancreatectomy, without findings  of  recurrent or metastatic disease. 2. Incompletely imaged hypoenhancement within lower lobe right pulmonary artery branch is likely chronic (but interval since 10/09/2017) pulmonary embolism. Dedicated CTA could further evaluate. 3. Decrease in size of a peripancreatic complex fluid collection anteriorly, likely a resolving pseudocyst. 4. Decreased size of minimal fluid versus a borderline sized node in the gastrohepatic ligament. Recommend attention on follow-up. 5. Hepatic steatosis with a segment 4 hemangioma. 6. Aortic Atherosclerosis (ICD10-I70.0). This is significantly age advanced.   07/12/2018 Tumor Marker   Patient's tumor was tested for the following markers: CA-19-9 Results of the tumor marker test revealed 6   07/12/2018 Imaging   1. Status post distal pancreatectomy with stable postoperative fluid collection adjacent to the ventral aspect of the pancreatic head. No findings to suggest metastatic disease in the abdomen or pelvis. 2. Hepatic steatosis with small cavernous hemangioma in segment 4A of the liver. 3. Slight decreased size of nonenlarged gastrohepatic ligament lymph node, presumably benign. 4. Aortic atherosclerosis.    10/16/2018 Imaging   Status post distal pancreatectomy. Stable postoperative seroma along the surgical margin.   Status post hysterectomy and right oophorectomy.   Status post right hemicolectomy with appendectomy.   No evidence of recurrent or metastatic disease.   No colonic wall thickening or mass is evident on CT.     REVIEW OF SYSTEMS:   Constitutional: Denies fevers, chills or abnormal weight loss Eyes: Denies blurriness of vision Ears, nose, mouth, throat, and face: Denies mucositis or sore throat Respiratory: Denies cough, dyspnea or wheezes Cardiovascular: Denies palpitation, chest discomfort or lower extremity swelling Gastrointestinal:  Denies nausea, heartburn or change in bowel habits Skin: Denies abnormal skin rashes Lymphatics:  Denies new lymphadenopathy or easy bruising Neurological:Denies numbness, tingling or new weaknesses Behavioral/Psych: Mood is stable, no new changes  All other systems were reviewed with the patient and are negative.  I have reviewed the past medical history, past surgical history, social history and family history with the patient and they are unchanged from previous note.  ALLERGIES:  is allergic to codeine; penicillins; and bactrim [sulfamethoxazole-trimethoprim].  MEDICATIONS:  Current Outpatient Medications  Medication Sig Dispense Refill  . folic acid (FOLVITE) 1 MG tablet Take 1 mg by mouth daily.    Marland Kitchen acetaminophen (TYLENOL) 650 MG CR tablet Take 1,300 mg by mouth every 8 (eight) hours.    . citalopram (CELEXA) 20 MG tablet TAKE 1 TABLET BY MOUTH EVERY DAY 90 tablet 4  . diclofenac sodium (VOLTAREN) 1 % GEL APPLY TO AFFECTED 3 LARGE JOINTS UP TO 3 TIMES DAILY AS NEEDED AS DIRECTED 300 g 1  . gabapentin (NEURONTIN) 300 MG capsule TAKE 1 CAPSULE BY MOUTH THREE TIMES A DAY 270 capsule 11  . lidocaine-prilocaine (EMLA) cream Apply to affected area once (Patient taking differently: Apply 1 application topically daily as needed (port). Apply to affected area once) 30 g 3  . loratadine (CLARITIN) 10 MG tablet Take 10 mg by mouth daily after breakfast.    . metFORMIN (GLUCOPHAGE) 500 MG tablet TAKE 1 TABLET (500 MG TOTAL) BY MOUTH 2 (TWO) TIMES DAILY WITH A MEAL. 60 tablet 1  . methocarbamol (ROBAXIN) 500 MG tablet TAKE 1 TABLET (500 MG TOTAL) BY MOUTH 3 (THREE) TIMES DAILY AS NEEDED FOR MUSCLE SPASMS. 90 tablet 0  . Methotrexate, PF, 20 MG/0.4ML SOAJ Inject 20 mg into the skin once a week. 12 pen 0  . naproxen sodium (ALEVE) 220 MG tablet Take 220 mg by mouth as needed.     Marland Kitchen  omeprazole (PRILOSEC) 20 MG capsule Take 20 mg by mouth 2 (two) times daily before a meal.     . ORENCIA CLICKJECT 335 MG/ML SOAJ INJECT ONE CLICKJECT PEN SUBCUTANEOUSLY EVERY WEEK. REFRIGERATE. ALLOWTO WARM TO ROOM  TEMPERATURE PRIOR TO ADMINISTRATION. 12 Syringe 0  . predniSONE (DELTASONE) 5 MG tablet TAKE 1 TABLET BY MOUTH EVERY DAY WITH BREAKFAST 30 tablet 0  . rivaroxaban (XARELTO) 20 MG TABS tablet Take 1 tablet (20 mg total) by mouth daily with supper. 30 tablet 9  . simethicone (MYLICON) 456 MG chewable tablet Chew 125 mg by mouth every 6 (six) hours as needed for flatulence.    . traMADol (ULTRAM) 50 MG tablet Take 1 tablet (50 mg total) by mouth every 6 (six) hours as needed. 60 tablet 0  . triamcinolone (NASACORT) 55 MCG/ACT AERO nasal inhaler Place 2 sprays into the nose daily. 1 Inhaler 12   No current facility-administered medications for this visit.     PHYSICAL EXAMINATION: ECOG PERFORMANCE STATUS: 1 - Symptomatic but completely ambulatory  Vitals:   10/17/18 0835  BP: (!) 146/77  Pulse: 65  Resp: 18  Temp: 98 F (36.7 C)  SpO2: 100%   Filed Weights   10/17/18 0835  Weight: 205 lb 9.6 oz (93.3 kg)    GENERAL:alert, no distress and comfortable SKIN: skin color, texture, turgor are normal, no rashes or significant lesions EYES: normal, Conjunctiva are pink and non-injected, sclera clear OROPHARYNX:no exudate, no erythema and lips, buccal mucosa, and tongue normal  NECK: supple, thyroid normal size, non-tender, without nodularity LYMPH:  no palpable lymphadenopathy in the cervical, axillary or inguinal LUNGS: clear to auscultation and percussion with normal breathing effort HEART: regular rate & rhythm and no murmurs and no lower extremity edema ABDOMEN:abdomen soft, non-tender and normal bowel sounds Musculoskeletal:no cyanosis of digits and no clubbing  NEURO: alert & oriented x 3 with fluent speech, no focal motor/sensory deficits  LABORATORY DATA:  I have reviewed the data as listed    Component Value Date/Time   NA 139 10/16/2018 1323   K 4.3 10/16/2018 1323   CL 103 10/16/2018 1323   CO2 25 10/16/2018 1323   GLUCOSE 114 (H) 10/16/2018 1323   BUN 12 10/16/2018  1323   CREATININE 0.65 10/16/2018 1323   CREATININE 0.71 05/23/2018 0905   CREATININE 0.68 07/27/2017 0851   CALCIUM 9.9 10/16/2018 1323   PROT 7.9 10/16/2018 1323   ALBUMIN 3.7 10/16/2018 1323   AST 21 10/16/2018 1323   AST 24 05/23/2018 0905   ALT 26 10/16/2018 1323   ALT 49 (H) 05/23/2018 0905   ALKPHOS 78 10/16/2018 1323   BILITOT 0.2 (L) 10/16/2018 1323   BILITOT 0.2 (L) 05/23/2018 0905   GFRNONAA >60 10/16/2018 1323   GFRNONAA >60 05/23/2018 0905   GFRNONAA 106 07/27/2017 0851   GFRAA >60 10/16/2018 1323   GFRAA >60 05/23/2018 0905   GFRAA 123 07/27/2017 0851    No results found for: SPEP, UPEP  Lab Results  Component Value Date   WBC 11.7 (H) 10/16/2018   NEUTROABS 9.5 (H) 10/16/2018   HGB 10.8 (L) 10/16/2018   HCT 34.7 (L) 10/16/2018   MCV 84.0 10/16/2018   PLT 562 (H) 10/16/2018      Chemistry      Component Value Date/Time   NA 139 10/16/2018 1323   K 4.3 10/16/2018 1323   CL 103 10/16/2018 1323   CO2 25 10/16/2018 1323   BUN 12 10/16/2018 1323   CREATININE  0.65 10/16/2018 1323   CREATININE 0.71 05/23/2018 0905   CREATININE 0.68 07/27/2017 0851      Component Value Date/Time   CALCIUM 9.9 10/16/2018 1323   ALKPHOS 78 10/16/2018 1323   AST 21 10/16/2018 1323   AST 24 05/23/2018 0905   ALT 26 10/16/2018 1323   ALT 49 (H) 05/23/2018 0905   BILITOT 0.2 (L) 10/16/2018 1323   BILITOT 0.2 (L) 05/23/2018 0905       RADIOGRAPHIC STUDIES: I have reviewed CT imaging with the patient I have personally reviewed the radiological images as listed and agreed with the findings in the report. Ct Chest W Contrast  Result Date: 10/16/2018 CLINICAL DATA:  Pancreatic cancer, status post distal pancreatectomy. History of endometrial cancer and right ovarian metastasis. Lynch syndrome. EXAM: CT CHEST, ABDOMEN, AND PELVIS WITH CONTRAST TECHNIQUE: Multidetector CT imaging of the chest, abdomen and pelvis was performed following the standard protocol during bolus  administration of intravenous contrast. CONTRAST:  169m OMNIPAQUE IOHEXOL 300 MG/ML  SOLN COMPARISON:  CT abdomen/pelvis dated 07/12/2018. CTA chest dated 10/09/2017. FINDINGS: CT CHEST FINDINGS Cardiovascular: Heart is normal in size.  No pericardial effusion. No evidence of thoracic aortic aneurysm. Right chest port terminates in the upper right atrium. Mediastinum/Nodes: No suspicious mediastinal, hilar, or axillary lymphadenopathy. Visualized thyroid is unremarkable. Lungs/Pleura: Lungs are clear. No suspicious pulmonary nodules. No focal consolidation. No pleural effusion or pneumothorax. Musculoskeletal: Very mild degenerative changes of the lower thoracic spine. CT ABDOMEN PELVIS FINDINGS Hepatobiliary: On the arterial phase, there is a 1.8 cm lesion with peripheral nodular discontinuous enhancement was ring approximately 1.8 cm (series 2/image 22). Along portal venous phase, there is a broader region of wash-in measuring approximately 2.4 cm. This appearance remains compatible with a benign hemangioma. No new/suspicious hepatic lesions. Status post cholecystectomy. No intrahepatic or extrahepatic ductal dilatation. Pancreas: Status post distal pancreatectomy. Stable 14 mm cystic lesion/fluid collection along the surgical margin (series 7/image 33), unchanged. Spleen: Surgically absent. Adrenals/Urinary Tract: Adrenal glands are within normal limits. Kidneys are within normal limits.  No hydronephrosis. Bladder is within normal limits. Stomach/Bowel: Stomach is within normal limits. Status post right hemicolectomy with appendectomy. No evidence of bowel obstruction. No colonic wall thickening or mass is evident on CT. Vascular/Lymphatic: No evidence of abdominal aortic aneurysm. Atherosclerotic calcifications of the abdominal aorta and branch vessels. No suspicious abdominopelvic lymphadenopathy. Small bilateral pelvic sidewall nodes measuring up to 7 mm short axis (series 7/image 177). Reproductive: Status  post hysterectomy. Status post right oophorectomy.  No left adnexal mass. Other: No abdominopelvic ascites. Stable fat necrosis along the right paramidline anterior abdominal wall (series 7/image 101). Stable diastasis/left paramidline periumbilical hernia (series 7/image 133). Musculoskeletal: Visualized osseous structures are within normal limits. IMPRESSION: Status post distal pancreatectomy. Stable postoperative seroma along the surgical margin. Status post hysterectomy and right oophorectomy. Status post right hemicolectomy with appendectomy. No evidence of recurrent or metastatic disease. No colonic wall thickening or mass is evident on CT. Electronically Signed   By: SJulian HyM.D.   On: 10/16/2018 15:30   Ct Abdomen Pelvis W Contrast  Result Date: 10/16/2018 CLINICAL DATA:  Pancreatic cancer, status post distal pancreatectomy. History of endometrial cancer and right ovarian metastasis. Lynch syndrome. EXAM: CT CHEST, ABDOMEN, AND PELVIS WITH CONTRAST TECHNIQUE: Multidetector CT imaging of the chest, abdomen and pelvis was performed following the standard protocol during bolus administration of intravenous contrast. CONTRAST:  1045mOMNIPAQUE IOHEXOL 300 MG/ML  SOLN COMPARISON:  CT abdomen/pelvis dated 07/12/2018. CTA chest dated  10/09/2017. FINDINGS: CT CHEST FINDINGS Cardiovascular: Heart is normal in size.  No pericardial effusion. No evidence of thoracic aortic aneurysm. Right chest port terminates in the upper right atrium. Mediastinum/Nodes: No suspicious mediastinal, hilar, or axillary lymphadenopathy. Visualized thyroid is unremarkable. Lungs/Pleura: Lungs are clear. No suspicious pulmonary nodules. No focal consolidation. No pleural effusion or pneumothorax. Musculoskeletal: Very mild degenerative changes of the lower thoracic spine. CT ABDOMEN PELVIS FINDINGS Hepatobiliary: On the arterial phase, there is a 1.8 cm lesion with peripheral nodular discontinuous enhancement was ring  approximately 1.8 cm (series 2/image 22). Along portal venous phase, there is a broader region of wash-in measuring approximately 2.4 cm. This appearance remains compatible with a benign hemangioma. No new/suspicious hepatic lesions. Status post cholecystectomy. No intrahepatic or extrahepatic ductal dilatation. Pancreas: Status post distal pancreatectomy. Stable 14 mm cystic lesion/fluid collection along the surgical margin (series 7/image 33), unchanged. Spleen: Surgically absent. Adrenals/Urinary Tract: Adrenal glands are within normal limits. Kidneys are within normal limits.  No hydronephrosis. Bladder is within normal limits. Stomach/Bowel: Stomach is within normal limits. Status post right hemicolectomy with appendectomy. No evidence of bowel obstruction. No colonic wall thickening or mass is evident on CT. Vascular/Lymphatic: No evidence of abdominal aortic aneurysm. Atherosclerotic calcifications of the abdominal aorta and branch vessels. No suspicious abdominopelvic lymphadenopathy. Small bilateral pelvic sidewall nodes measuring up to 7 mm short axis (series 7/image 177). Reproductive: Status post hysterectomy. Status post right oophorectomy.  No left adnexal mass. Other: No abdominopelvic ascites. Stable fat necrosis along the right paramidline anterior abdominal wall (series 7/image 101). Stable diastasis/left paramidline periumbilical hernia (series 7/image 133). Musculoskeletal: Visualized osseous structures are within normal limits. IMPRESSION: Status post distal pancreatectomy. Stable postoperative seroma along the surgical margin. Status post hysterectomy and right oophorectomy. Status post right hemicolectomy with appendectomy. No evidence of recurrent or metastatic disease. No colonic wall thickening or mass is evident on CT. Electronically Signed   By: Julian Hy M.D.   On: 10/16/2018 15:30    All questions were answered. The patient knows to call the clinic with any problems,  questions or concerns. No barriers to learning was detected.  I spent 25 minutes counseling the patient face to face. The total time spent in the appointment was 30 minutes and more than 50% was on counseling and review of test results  Heath Lark, MD 10/19/2018 10:58 AM

## 2018-10-19 NOTE — Assessment & Plan Note (Signed)
She has severe osteoarthritis I refill her prescription tramadol

## 2018-10-19 NOTE — Assessment & Plan Note (Signed)
She will discontinue anticoagulation therapy once her current prescription runs out

## 2018-10-20 ENCOUNTER — Telehealth: Payer: Self-pay | Admitting: Hematology and Oncology

## 2018-10-20 NOTE — Telephone Encounter (Signed)
I left a message regarding schedule  

## 2018-10-24 ENCOUNTER — Other Ambulatory Visit: Payer: Self-pay | Admitting: Rheumatology

## 2018-10-24 NOTE — Telephone Encounter (Signed)
Last Visit: 10/16/18 Next Visit: 03/20/19   Okay to refill per Dr. Estanislado Pandy

## 2018-10-26 ENCOUNTER — Encounter: Payer: Self-pay | Admitting: Rheumatology

## 2018-10-26 ENCOUNTER — Ambulatory Visit: Payer: BC Managed Care – PPO | Attending: Hematology and Oncology | Admitting: Physical Therapy

## 2018-10-26 ENCOUNTER — Other Ambulatory Visit: Payer: Self-pay

## 2018-10-26 DIAGNOSIS — R262 Difficulty in walking, not elsewhere classified: Secondary | ICD-10-CM | POA: Insufficient documentation

## 2018-10-26 DIAGNOSIS — R209 Unspecified disturbances of skin sensation: Secondary | ICD-10-CM | POA: Diagnosis not present

## 2018-10-26 DIAGNOSIS — M6281 Muscle weakness (generalized): Secondary | ICD-10-CM | POA: Diagnosis not present

## 2018-10-26 MED ORDER — FOLIC ACID 1 MG PO TABS: 2.0000 mg | ORAL_TABLET | Freq: Every day | ORAL | 3 refills | Status: DC

## 2018-10-26 NOTE — Therapy (Signed)
[place], [place], [address] Phone: [phone]   Fax:  [phone]  Physical Therapy Evaluation  Patient Details  Name: Dana Hicks MRN: [id number] Date of Birth: December 16, 1975 Referring Provider (PT): Alvy Bimler    Encounter Date: 10/26/2018  PT End of Session - 10/26/18 1655    Visit Number  1    Number of Visits  17    Date for PT Re-Evaluation  12/29/18    PT Start Time  1430    PT Stop Time  1515    PT Time Calculation (min)  45 min    Activity Tolerance  Patient tolerated treatment well    Behavior During Therapy  [facility] for tasks assessed/performed       Past Medical History:  Diagnosis Date  . Allergic rhinitis, seasonal   . 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 Endometrial cancer ([place]) 08/2017   Stage IA, Grade 1  . History of MRSA infection 01/2017   followed by infectious disease center--  recurrent pustular folliculitis  . Nephrolithiasis    bilateral nonobstructive calculi per CT 08-18-2017  . OA (osteoarthritis)   . Ovarian cancer, right ([place]) 08/2017   Stage II Grade 2 Endometrioid  . Pancreatic cancer ([place])   . Rheumatoid arthritis [facility])    rheumatologist-  dr devenswar-- treated w/ oral prednisone daily and methotrexate injection every 3 wks    Past Surgical History:  Procedure Laterality Date  . BREAST LUMPECTOMY WITH RADIOACTIVE SEED LOCALIZATION Left 06/28/2014   Benign Procedure: RADIOACTIVE SEED LOCALIZATION LEFT BREAST LUMPECTOMY;  Surgeon: Excell Seltzer, MD;  Location: [place];  Service: General;  Laterality: Left;  . COLONOSCOPY  06/19/14  . EYE SURGERY Left    plug in tear duct  . IR IMAGING GUIDED PORT INSERTION  09/26/2017  . KNEE ARTHROSCOPY    . PANCREATECTOMY  11/25/2017  . PORT A CATH REVISION  2001   in and out  . RIGHT COLECTOMY  06/02/2009   cecum cancer  . ROBOTIC  ASSISTED TOTAL HYSTERECTOMY WITH BILATERAL SALPINGO OOPHERECTOMY Bilateral 08/30/2017   Procedure: XI ROBOTIC ASSISTED TOTAL  HYSTERECTOMY BILATERAL  SALPINGO OOPHORECTOMY; LYSIS OF ADHESIONS;  Surgeon: Isabel Caprice, MD;  Location: [facility];  Service: Gynecology;  Laterality: Bilateral;  . SPLENECTOMY, PARTIAL  11/25/2017    There were no vitals filed for this visit.   Subjective Assessment - 10/26/18 1444    Subjective  Everything in my body feels weak. Her knees are very painful Pt says she also has neuropathy in both hands and feet that is numbness    Pertinent History  colon cancer at 43 yo, with surgery and chemo  and was  cancer free for 19 years.  Developed Rheumatoid Arthritis 12 years ago and osteoarthritis ( was on humera for years, but had to come off that) she primarily has pain in large joints ( knees are horrible Rt>Lt that got worse with chemo, hopes to have Lt TKA as soon as she can) She has trouble sleeping alot She developed back pain and was found to have ovarian cancer and during the surgeries for this was found to have pancreatic cancer had removal of tail of pancrease, gall bladder  ( she has Lynch sydrome) and had many rounds of chemo . She is 4 months out of chemo. Pt also had a PE during recovery and just came off blood thinners.  Patient Stated Goals  Pt wants to try to regain some of the strength she has lost , she wants to get strength so that she can fight her pancreatic cancer    Currently in Pain?  Yes    Pain Score  2     Pain Location  Ankle    Pain Orientation  Left    Pain Descriptors / Indicators  --   stiffness   Pain Type  Chronic pain    Pain Onset  More than a month ago    Pain Frequency  Occasional   thinks that its just that she has walked alot the past couple of days   Aggravating Factors   walking    Pain Relieving Factors  elevate it and rest         [facility] PT Assessment - 10/26/18 0001      Assessment   Medical Diagnosis  mulitple cancers,  RA and OA    Referring Provider (PT)  Alvy Bimler     Onset Date/Surgical Date  08/30/17    Hand Dominance  Right      Precautions   Precautions  Fall    Precaution Comments  recent chemo       Restrictions   Weight Bearing Restrictions  No      Balance Screen   Has the patient fallen in the past 6 months  Yes    How many times?  once , she fell in a hole and twisted right ankle     Has the patient had a decrease in activity level because of a fear of falling?   Yes    Is the patient reluctant to leave their home because of a fear of falling?   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 pt wants to exercise    pt has lost 48# since this diagnosis      Cognition   Overall Cognitive Status  Within Functional Limits for tasks assessed      Observation/Other Assessments   Observations  well healed incicions along midline of abdomen from 2 sugeries going though same incision  pt appears to have large legs( but states she has always had them ) She states she has some swelling in her left ankle for the first time today  bruises seen on right leg     Skin Integrity  no open areas       Sensation   Additional Comments  numbness in hands and feet       Coordination   Gross Motor Movements are Fluid and Coordinated  Yes      Functional Tests   Functional tests  Sit to Stand      Sit to Stand   Comments  12 reps in 30 sec       Posture/Postural Control   Posture/Postural Control  Postural  limitations    Postural Limitations  Rounded Shoulders;Forward  head;Increased lumbar lordosis;Anterior pelvic tilt      AROM   Overall AROM   Within functional limits for tasks performed      Strength   Right/Left hand  Right;Left    Right Hand Grip (lbs)  35/30/25    Left Hand Grip (lbs)  35/25/30    Right/Left Hip  Right;Left    Right Hip Flexion  3/5    Right Hip ABduction  3/5    Left Hip Flexion  3/5    Left Hip ABduction  2+/5      Bed Mobility   Bed Mobility  --   pt independent      Ambulation/Gait   Ambulation/Gait  Yes    Stairs  Yes    Stairs Assistance  6: Modified independent (Device/Increase time)    Stair Management Technique  One rail Right;Step to pattern   up with left leg, down with right, one step at a time    Number of Stairs  6      Standardized Balance Assessment   Standardized Balance Assessment  Timed Up and Go Test      Timed Up and Go Test   TUG  Normal TUG    Normal TUG (seconds)  7.74    Manual TUG (seconds)  9.54   9.54 while carrying a cup of water    Cognitive TUG (seconds)  9.47   counting back by 3               Objective measurements completed on examination: See above findings.                   PT Long Term Goals - 10/26/18 1513      PT LONG TERM GOAL #1   Title  Pt will report that she is able to do through her day at work wil 50% less exhaustion    Time  4    Period  Weeks    Status  New      PT LONG TERM GOAL #2   Title  Pt will increase both hip abductor strength to 4/4    Time  4    Period  Weeks    Status  New      PT LONG TERM GOAL #3   Title  Pt will reports her dynamic balance during the day is improved by 50%    Time  4    Period  Weeks    Status  New      PT LONG TERM GOAL #4   Title  Pt will be independent in an HEP to prepare for TKR including core strength    Time  4    Period  Weeks    Status  New             Plan - 10/26/18 1655    Clinical Impression Statement   43 yo patient with significant history for multiple cancers with surgeries and chemotherapy.  She also has RA and OA and is anticipating a left total knee replacement later this year. She presents with diffuse pain and numbness from CIPN in her hands and feet and fatigue while at work with functional limitation in going up and down stairs and daily life. She has weakness in her hips, especially hip abductors  She will benefit from PT to get stronger to prepare forTKR and future possible cancer treatments and allow her to do her job and daily life easier  Personal Factors and Comorbidities  Comorbidity 3+;Past/Current Experience    Comorbidities  colon, ovarian, and pancreatic cancer, RA, OA    Examination-Activity Limitations  Locomotion Level    Stability/Clinical Decision Making  Evolving/Moderate complexity    Clinical Decision Making  Moderate    Rehab Potential  Good    PT Frequency  2x / week    PT Duration  8 weeks    PT Treatment/Interventions  ADLs/Self Care Home Management;Aquatic Therapy;Gait training;Stair training;Functional mobility training;Therapeutic activities;Therapeutic exercise;Balance training;Neuromuscular re-education;Patient/family education    PT Next Visit Plan  Begin core and LE and UE strengthening progam, with emphasis on hip abductors and preparation for TKR suregery  Include arms and armchair pushups  try nu step and see if knees with tolerat.  Progress with balance activities .  teach HEP    Consulted and Agree with Plan of Care  Patient       Patient will benefit from skilled therapeutic intervention in order to improve the following deficits and impairments:  Abnormal gait, Decreased balance, Decreased endurance, Decreased mobility, Difficulty walking, Postural dysfunction, Pain, Impaired sensation  Visit Diagnosis: Muscle weakness (generalized) - Plan: PT plan of care cert/re-cert  Difficulty in walking, not elsewhere classified - Plan: PT plan of care  cert/re-cert  Unspecified disturbances of skin sensation - Plan: PT plan of care cert/re-cert     Problem List Patient Active Problem List   Diagnosis Date Noted  . UTI (urinary tract infection) 06/29/2018  . Pulmonary embolism ([place]) 04/10/2018  . Diarrhea due to drug 03/28/2018  . 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 Neutropenic fever ([place]) 02/14/2018  . Acute rhinitis 02/14/2018  . Tachycardia 02/14/2018  . Physical debility 02/07/2018  . Hot flashes due to surgical menopause 02/06/2018  . Anemia due to antineoplastic chemotherapy 12/15/2017  . Hypomagnesemia 12/15/2017  . Iron deficiency anemia 12/15/2017  . S/P splenectomy 12/15/2017  . Lynch syndrome 11/22/2017  . Anterior leg pain, right 11/21/2017  . Genetic testing 11/18/2017  . Inflammatory arthritis 11/08/2017  . Dysuria 11/02/2017  . Peripheral neuropathy due to chemotherapy ([place]) 10/22/2017  . Bone pain 10/21/2017  . Family history of colon cancer   . Pancreatic cancer ([place]) 10/17/2017  . Family history of uterine cancer 10/17/2017  . Sinusitis, chronic 09/15/2017  . Malabsorption due to disorder of pancreas 09/15/2017  . Endometrial cancer ([place]) 08/31/2017  . 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 Pustular folliculitis 94/76/5465  . MRSA colonization 05/12/2017  . Left foot pain 01/10/2017  . Osteoarthritis of both knees 12/01/2016  . Class 3 severe obesity due to excess calories without serious comorbidity with body mass index (BMI) of 40.0 to 44.9 in adult ([place]) 10/01/2016  . High risk medication use 02/06/2016  . Family history of rheumatoid arthritis 02/06/2016  . H/O seasonal allergies 02/06/2016  . Rheumatoid arthritis of multiple sites with negative rheumatoid factor ([place]) 02/03/2016  . History of colon cancer 02/03/2016  . HLA B27 (HLA B27 positive) 02/03/2016   Donato Heinz. Owens Shark PT  Norwood Levo 10/26/2018, 5:17 PM  [place], [place], [address] Phone: [phone]   Fax:  [phone]  Name: ARISBEL MAIONE MRN: [id number] Date of Birth: 16-Dec-1975

## 2018-10-26 NOTE — Telephone Encounter (Signed)
Last Visit: 10/16/18 Next Visit: 03/20/19   Okay to refill per Dr. Estanislado Pandy

## 2018-10-31 ENCOUNTER — Other Ambulatory Visit: Payer: Self-pay

## 2018-10-31 ENCOUNTER — Ambulatory Visit: Payer: BC Managed Care – PPO | Admitting: Physical Therapy

## 2018-10-31 DIAGNOSIS — R262 Difficulty in walking, not elsewhere classified: Secondary | ICD-10-CM

## 2018-10-31 DIAGNOSIS — M6281 Muscle weakness (generalized): Secondary | ICD-10-CM | POA: Diagnosis not present

## 2018-10-31 DIAGNOSIS — R209 Unspecified disturbances of skin sensation: Secondary | ICD-10-CM | POA: Diagnosis not present

## 2018-10-31 NOTE — Therapy (Signed)
[place], [place], [address] Phone: [phone]   Fax:  [phone]  Physical Therapy Treatment  Patient Details  Name: Dana Hicks MRN: [id number] Date of Birth: 1975-12-22 Referring Provider (PT): Alvy Bimler    Encounter Date: 10/31/2018  PT End of Session - 10/31/18 1606    Visit Number  2    Number of Visits  17    Date for PT Re-Evaluation  12/29/18    PT Start Time  1230    PT Stop Time  1315    PT Time Calculation (min)  45 min    Activity Tolerance  Patient tolerated treatment well    Behavior During Therapy  [facility] for tasks assessed/performed       Past Medical History:  Diagnosis Date  . Allergic rhinitis, seasonal   . Cecal cancer ([place]) 2001   Stage II (T3N0)  04-11-2001cecum-partial colectomy and completed chemo 2002  . Endometrial cancer ([name]) 08/2017   Stage IA, Grade 1  . History of MRSA infection 01/2017   followed by infectious disease center--  recurrent pustular folliculitis  . Nephrolithiasis    bilateral nonobstructive calculi per CT 08-18-2017  . OA (osteoarthritis)   . Ovarian cancer, right ([place]) 08/2017   Stage II Grade 2 Endometrioid  . Pancreatic cancer ([place])   . Rheumatoid arthritis [facility])    rheumatologist-  dr devenswar-- treated w/ oral prednisone daily and methotrexate injection every 3 wks    Past Surgical History:  Procedure Laterality Date  . BREAST LUMPECTOMY WITH RADIOACTIVE SEED LOCALIZATION Left 06/28/2014   Benign Procedure: RADIOACTIVE SEED LOCALIZATION LEFT BREAST LUMPECTOMY;  Surgeon: Excell Seltzer, MD;  Location: [place];  Service: General;  Laterality: Left;  . COLONOSCOPY  06/19/14  . EYE SURGERY Left    plug in tear duct  . IR IMAGING GUIDED PORT INSERTION  09/26/2017  . KNEE ARTHROSCOPY    . PANCREATECTOMY  11/25/2017  . PORT A CATH REVISION  2001   in and out  . RIGHT COLECTOMY  06/02/2009   cecum cancer  . ROBOTIC  ASSISTED TOTAL HYSTERECTOMY WITH BILATERAL SALPINGO OOPHERECTOMY Bilateral 08/30/2017   Procedure: XI ROBOTIC ASSISTED TOTAL  HYSTERECTOMY BILATERAL  SALPINGO OOPHORECTOMY; LYSIS OF ADHESIONS;  Surgeon: Isabel Caprice, MD;  [facility];  Service: Gynecology;  Laterality: Bilateral;  . SPLENECTOMY, PARTIAL  11/25/2017    There were no vitals filed for this visit.  Subjective Assessment - 10/31/18 1559    Subjective  Pt states that her knees are aching.  They hurt worse in the morning and feel a little better during the day    Pertinent History  colon cancer at 43 yo, with surgery and chemo  and was  cancer free for 19 years.  Developed Rheumatoid Arthritis 12 years ago and osteoarthritis ( was on humera for years, but had to come off that) she primarily has pain in large joints ( knees are horrible Rt>Lt that got worse with chemo, hopes to have Lt TKA as soon as she can) She has trouble sleeping alot She developed back pain and was found to have ovarian cancer and during the surgeries for this was found to have pancreatic cancer had removal of tail of pancrease, gall bladder  ( she has Lynch sydrome) and had many rounds of chemo . She is 4 months out of chemo. Pt also had a PE during recovery and just came off blood thinners.    Currently  in Pain?  Yes    Pain Score  4     Pain Location  Knee    Pain Orientation  Left    Pain Descriptors / Indicators  Aching    Pain Type  Chronic pain                       [facility] Adult PT Treatment/Exercise - 10/31/18 0001      Exercises   Exercises  Shoulder;Lumbar;Knee/Hip      Lumbar Exercises: Supine   Pelvic Tilt  5 reps    Clam  5 reps    Clam Limitations  extra verbal cues to slow down and to control motion while keepkng abdominals engaged     Heel Slides  10 reps    Bent Knee Raise  10 reps    Bridge  10 reps    Single Leg Bridge  10 reps   with left leg extended to prepare for bed mobility after TKR   Straight Leg Raise  10  reps    Straight Leg Raises Limitations  reinforced tighten lift lower relax       Knee/Hip Exercises: Sidelying   Hip ABduction  AROM;Right;Left;10 reps      Shoulder Exercises: Seated   Other Seated Exercises  armchair pushups x 5 reps for prepare for surgery                   PT Long Term Goals - 10/26/18 1513      PT LONG TERM GOAL #1   Title  Pt will report that she is able to do through her day at work wil 50% less exhaustion    Time  4    Period  Weeks    Status  New      PT LONG TERM GOAL #2   Title  Pt will increase both hip abductor strength to 4/4    Time  4    Period  Weeks    Status  New      PT LONG TERM GOAL #3   Title  Pt will reports her dynamic balance during the day is improved by 50%    Time  4    Period  Weeks    Status  New      PT LONG TERM GOAL #4   Title  Pt will be independent in an HEP to prepare for TKR including core strength    Time  4    Period  Weeks    Status  New            Plan - 10/31/18 1606    Clinical Impression Statement  Instructed in HEP for core exercises and LE strength.  Pt  needed frequent cues to slow down and control movment but was able to do well. She is anxious to get stronger to prepare for TKR surgery    Comorbidities  colon, ovarian, and pancreatic cancer, RA, OA    Stability/Clinical Decision Making  Evolving/Moderate complexity    Rehab Potential  Good    PT Treatment/Interventions  ADLs/Self Care Home Management;Aquatic Therapy;Gait training;Stair training;Functional mobility training;Therapeutic activities;Therapeutic exercise;Balance training;Neuromuscular re-education;Patient/family education    PT Next Visit Plan  ask if questions bout HEP for core and LE and UE strengthening progam, with emphasis on hip abductors and preparation for TKR suregery  Include arms and armchair pushups  try nu step and see if knees with tolerat.  Progress with balance activities .  teach HEP    Consulted and Agree  with Plan of Care  Patient       Patient will benefit from skilled therapeutic intervention in order to improve the following deficits and impairments:  Abnormal gait, Decreased balance, Decreased endurance, Decreased mobility, Difficulty walking, Postural dysfunction, Pain, Impaired sensation  Visit Diagnosis: Muscle weakness (generalized)  Difficulty in walking, not elsewhere classified  Unspecified disturbances of skin sensation     Problem List Patient Active Problem List   Diagnosis Date Noted  . UTI (urinary tract infection) 06/29/2018  . Pulmonary embolism ([place]) 04/10/2018  . Diarrhea due to drug 03/28/2018  . Steroid-induced diabetes ([name]) 03/14/2018  . Neutropenic fever ([place]) 02/14/2018  . Acute rhinitis 02/14/2018  . Tachycardia 02/14/2018  . Physical debility 02/07/2018  . Hot flashes due to surgical menopause 02/06/2018  . Anemia due to antineoplastic chemotherapy 12/15/2017  . Hypomagnesemia 12/15/2017  . Iron deficiency anemia 12/15/2017  . S/P splenectomy 12/15/2017  . Lynch syndrome 11/22/2017  . Anterior leg pain, right 11/21/2017  . Genetic testing 11/18/2017  . Inflammatory arthritis 11/08/2017  . Dysuria 11/02/2017  . Peripheral neuropathy due to chemotherapy ([place]) 10/22/2017  . Bone pain 10/21/2017  . Family history of colon cancer   . Pancreatic cancer ([place]) 10/17/2017  . Family history of uterine cancer 10/17/2017  . Sinusitis, chronic 09/15/2017  . Malabsorption due to disorder of pancreas 09/15/2017  . Endometrial cancer ([place]) 08/31/2017  . Secondary malignant neoplasm of right ovary ([place]) 08/31/2017  . Pustular folliculitis 42/59/5638  . MRSA colonization 05/12/2017  . Left foot pain 01/10/2017  . Osteoarthritis of both knees 12/01/2016  . Class 3 severe obesity due to excess calories without serious comorbidity with body mass index (BMI) of 40.0 to 44.9 in adult ([place]) 10/01/2016  . High risk medication use 02/06/2016  . Family history of  rheumatoid arthritis 02/06/2016  . H/O seasonal allergies 02/06/2016  . Rheumatoid arthritis of multiple sites with negative rheumatoid factor ([place]) 02/03/2016  . History of colon cancer 02/03/2016  . HLA B27 (HLA B27 positive) 02/03/2016   Donato Heinz. Owens Shark PT  Norwood Levo 10/31/2018, 4:09 PM  [place], [place], [address] Phone: [phone]   Fax:  [phone]  Name: Dana Hicks MRN: [id number] Date of Birth: July 25, 1975

## 2018-10-31 NOTE — Patient Instructions (Signed)
Access Code: [email or web address]  URL: [email or web address]  Date: 10/31/2018  Prepared by: Maudry Diego   Program Notes  Focus on activating the core muscles before each exercise. Make sure to breathe as you activate and if you feel like you lose the core activation then just stop and start again after reactivating.   Exercises Supine Posterior Pelvic Tilt - 10 reps - 3 sets - 1x daily - 7x weekly                  Supine 90/90 Abdominal Bracing - 10 reps - 10 seconds hold - 1x daily - 7x weekly Supine Transversus Abdominis Bracing with Leg Extension - 10 reps - 1 sets - 6 seconds-10 seconds hold - 1x daily - 7x weekly Supine Bridge - 10 reps - 10 seconds hold - 1x daily - 7x weekly Bent Knee Fallouts - 10 reps - 3 sets - 1x daily - 7x weekly Supine Straight Leg Raises - 10 reps - 3 sets - 1x daily - 7x weekly                  Sidelying Hip Abduction - 10 reps - 3 sets - 1x daily - 7x weekly

## 2018-11-02 DIAGNOSIS — R7309 Other abnormal glucose: Secondary | ICD-10-CM | POA: Diagnosis not present

## 2018-11-02 DIAGNOSIS — Z23 Encounter for immunization: Secondary | ICD-10-CM | POA: Diagnosis not present

## 2018-11-02 DIAGNOSIS — M25562 Pain in left knee: Secondary | ICD-10-CM | POA: Diagnosis not present

## 2018-11-02 DIAGNOSIS — Z01818 Encounter for other preprocedural examination: Secondary | ICD-10-CM | POA: Diagnosis not present

## 2018-11-02 DIAGNOSIS — G479 Sleep disorder, unspecified: Secondary | ICD-10-CM | POA: Diagnosis not present

## 2018-11-03 ENCOUNTER — Encounter

## 2018-11-03 ENCOUNTER — Other Ambulatory Visit: Payer: Self-pay | Admitting: *Deleted

## 2018-11-03 MED ORDER — METHOTREXATE (PF) 20 MG/0.4ML ~~LOC~~ SOAJ: 20.0000 mg | SUBCUTANEOUS | 0 refills | Status: DC

## 2018-11-03 NOTE — Telephone Encounter (Signed)
Refill request received via fax  Last Visit: 10/16/18 Next Visit: 03/20/19  Labs: 10/16/18   Okay to refill per Dr. Estanislado Pandy

## 2018-11-04 ENCOUNTER — Other Ambulatory Visit: Payer: Self-pay | Admitting: Rheumatology

## 2018-11-06 NOTE — Telephone Encounter (Signed)
Last Visit: 10/16/18 Next Visit: 03/20/19   Okay to refill per Dr. Estanislado Pandy

## 2018-11-07 ENCOUNTER — Other Ambulatory Visit: Payer: Self-pay

## 2018-11-07 ENCOUNTER — Ambulatory Visit: Payer: BC Managed Care – PPO | Admitting: Physical Therapy

## 2018-11-07 DIAGNOSIS — R262 Difficulty in walking, not elsewhere classified: Secondary | ICD-10-CM | POA: Diagnosis not present

## 2018-11-07 DIAGNOSIS — M6281 Muscle weakness (generalized): Secondary | ICD-10-CM | POA: Diagnosis not present

## 2018-11-07 DIAGNOSIS — R209 Unspecified disturbances of skin sensation: Secondary | ICD-10-CM | POA: Diagnosis not present

## 2018-11-07 NOTE — Patient Instructions (Signed)

## 2018-11-07 NOTE — Therapy (Signed)
[place], [place], [address] Phone: [phone]   Fax:  [phone]  Physical Therapy Treatment  Patient Details  Name: Dana Hicks MRN: [id number] Date of Birth: 1975-08-09 Referring Provider (PT): Alvy Bimler    Encounter Date: 11/07/2018  PT End of Session - 11/07/18 1544    Visit Number  3    Number of Visits  17    Date for PT Re-Evaluation  12/29/18    PT Start Time  1430    PT Stop Time  1510    PT Time Calculation (min)  40 min    Activity Tolerance  Patient tolerated treatment well    Behavior During Therapy  [facility] for tasks assessed/performed       Past Medical History:  Diagnosis Date  . Allergic rhinitis, seasonal   . Cecal cancer ([place]) 2001   Stage II (T3N0)  04-11-2001cecum-partial colectomy and completed chemo 2002  . Endometrial cancer ([place]) 08/2017   Stage IA, Grade 1  . History of MRSA infection 01/2017   followed by infectious disease center--  recurrent pustular folliculitis  . Nephrolithiasis    bilateral nonobstructive calculi per CT 08-18-2017  . OA (osteoarthritis)   . Ovarian cancer, right ([place]) 08/2017   Stage II Grade 2 Endometrioid  . Pancreatic cancer ([place])   . Rheumatoid arthritis [facility])    rheumatologist-  dr devenswar-- treated w/ oral prednisone daily and methotrexate injection every 3 wks    Past Surgical History:  Procedure Laterality Date  . BREAST LUMPECTOMY WITH RADIOACTIVE SEED LOCALIZATION Left 06/28/2014   Benign Procedure: RADIOACTIVE SEED LOCALIZATION LEFT BREAST LUMPECTOMY;  Surgeon: Excell Seltzer, MD;  Location: [place];  Service: General;  Laterality: Left;  . COLONOSCOPY  06/19/14  . EYE SURGERY Left    plug in tear duct  . IR IMAGING GUIDED PORT INSERTION  09/26/2017  . KNEE ARTHROSCOPY    . PANCREATECTOMY  11/25/2017  . PORT A CATH REVISION  2001   in and out  . RIGHT COLECTOMY  06/02/2009   cecum cancer  . ROBOTIC  ASSISTED TOTAL HYSTERECTOMY WITH BILATERAL SALPINGO OOPHERECTOMY Bilateral 08/30/2017   Procedure: XI ROBOTIC ASSISTED TOTAL  HYSTERECTOMY BILATERAL  SALPINGO OOPHORECTOMY; LYSIS OF ADHESIONS;  Surgeon: Isabel Caprice, MD;  [facility];  Service: Gynecology;  Laterality: Bilateral;  . SPLENECTOMY, PARTIAL  11/25/2017    There were no vitals filed for this visit.  Subjective Assessment - 11/07/18 1441    Subjective  Pt said she had some knee pain, but she had to stop the orencia. She has been working on getting the paperwork done to get the knee surgery scheduled.  She thinks    Pertinent History  colon cancer at 43 yo, with surgery and chemo  and was  cancer free for 19 years.  Developed Rheumatoid Arthritis 12 years ago and osteoarthritis ( was on humera for years, but had to come off that) she primarily has pain in large joints ( knees are horrible Rt>Lt that got worse with chemo, hopes to have Lt TKA as soon as she can) She has trouble sleeping alot She developed back pain and was found to have ovarian cancer and during the surgeries for this was found to have pancreatic cancer had removal of tail of pancrease, gall bladder  ( she has Lynch sydrome) and had many rounds of chemo . She is 4 months out of chemo. Pt also had a PE during recovery  and just came off blood thinners.    Patient Stated Goals  Pt wants to try to regain some of the strength she has lost , she wants to get strength so that she can fight her pancreatic cancer    Currently in Pain?  No/denies    Pain Score  0-No pain                       [facility] Adult PT Treatment/Exercise - 11/07/18 0001      Exercises   Exercises  Shoulder      Lumbar Exercises: Standing   Other Standing Lumbar Exercises  modified superwoman in standing with arms on armrests of chair w 5 reps with 5 sec holds       Lumbar Exercises: Supine   Dead Bug  5 reps      Knee/Hip Exercises: Standing   Lateral Step Up  Right;Left;10 reps     Lateral Step Up Limitations  onto airex mat     Other Standing Knee Exercises  wall taps with cues for hip hinge only     Other Standing Knee Exercises  dowel at 3 points on back with practice for  hip hinge x 15 reps       Knee/Hip Exercises: Sidelying   Hip ABduction  Strengthening;Right;Left;10 reps    Other Sidelying Knee/Hip Exercises  5  reps of small circles in each direction       Shoulder Exercises: Supine   Horizontal ABduction  Strengthening;Right;Left;5 reps;Theraband    Theraband Level (Shoulder Horizontal ABduction)  Level 1 (Yellow)    External Rotation  Strengthening;Right;Left;5 reps;Theraband    Theraband Level (Shoulder External Rotation)  Level 1 (Yellow)    Flexion  Strengthening;Right;Left;5 reps;Theraband   wide and narrow grip    Diagonals  Strengthening;Right;Left;5 reps;Theraband    Theraband Level (Shoulder Diagonals)  Level 1 (Yellow)      Shoulder Exercises: Standing   Other Standing Exercises  scaption arm raises with 2# x 10 reps while standing on airex     Other Standing Exercises  bicep curls x 10 reps with 2#  while standing on airex              PT Education - 11/07/18 1544    Education Details  supine scapular series    Person(s) Educated  Patient    Methods  Explanation;Handout    Comprehension  Verbalized understanding;Returned demonstration          PT Long Term Goals - 10/26/18 1513      PT LONG TERM GOAL #1   Title  Pt will report that she is able to do through her day at work wil 50% less exhaustion    Time  4    Period  Weeks    Status  New      PT LONG TERM GOAL #2   Title  Pt will increase both hip abductor strength to 4/4    Time  4    Period  Weeks    Status  New      PT LONG TERM GOAL #3   Title  Pt will reports her dynamic balance during the day is improved by 50%    Time  4    Period  Weeks    Status  New      PT LONG TERM GOAL #4   Title  Pt will be independent in an HEP to prepare for TKR including  core strength  Time  4    Period  Weeks    Status  New            Plan - 11/07/18 1545    Clinical Impression Statement  Progressed home program to supine scapular series with instruction to progress to sitting and standing as she gets stronger. She should be good with those exercises for home program and will work on other exercises and activities while in our clinic. Began working on standing balance with UE strength and hip hinge progression. Pt did well with all new exercises    Personal Factors and Comorbidities  Comorbidity 3+;Past/Current Experience    Comorbidities  colon, ovarian, and pancreatic cancer, RA, OA    Examination-Activity Limitations  Locomotion Level    PT Frequency  2x / week    PT Duration  8 weeks    PT Treatment/Interventions  ADLs/Self Care Home Management;Aquatic Therapy;Gait training;Stair training;Functional mobility training;Therapeutic activities;Therapeutic exercise;Balance training;Neuromuscular re-education;Patient/family education    PT Next Visit Plan  try nu step and see if knees with tolerate ask if questions bout HEP for core and LE and UE strengthening progam, with emphasis on hip abductors and preparation for TKR surgery  Progress with balance activities .    Consulted and Agree with Plan of Care  Patient       Patient will benefit from skilled therapeutic intervention in order to improve the following deficits and impairments:  Abnormal gait, Decreased balance, Decreased endurance, Decreased mobility, Difficulty walking, Postural dysfunction, Pain, Impaired sensation  Visit Diagnosis: Muscle weakness (generalized)  Difficulty in walking, not elsewhere classified  Unspecified disturbances of skin sensation     Problem List Patient Active Problem List   Diagnosis Date Noted  . UTI (urinary tract infection) 06/29/2018  . Pulmonary embolism ([place]) 04/10/2018  . Diarrhea due to drug 03/28/2018  . Steroid-induced diabetes ([place])  03/14/2018  . Neutropenic fever ([place]) 02/14/2018  . Acute rhinitis 02/14/2018  . Tachycardia 02/14/2018  . Physical debility 02/07/2018  . Hot flashes due to surgical menopause 02/06/2018  . Anemia due to antineoplastic chemotherapy 12/15/2017  . Hypomagnesemia 12/15/2017  . Iron deficiency anemia 12/15/2017  . S/P splenectomy 12/15/2017  . Lynch syndrome 11/22/2017  . Anterior leg pain, right 11/21/2017  . Genetic testing 11/18/2017  . Inflammatory arthritis 11/08/2017  . Dysuria 11/02/2017  . Peripheral neuropathy due to chemotherapy ([place]) 10/22/2017  . Bone pain 10/21/2017  . Family history of colon cancer   . Pancreatic cancer ([place]) 10/17/2017  . Family history of uterine cancer 10/17/2017  . Sinusitis, chronic 09/15/2017  . Malabsorption due to disorder of pancreas 09/15/2017  . Endometrial cancer ([place]) 08/31/2017  . Secondary malignant neoplasm of right ovary ([place]) 08/31/2017  . Pustular folliculitis 62/94/7654  . MRSA colonization 05/12/2017  . Left foot pain 01/10/2017  . Osteoarthritis of both knees 12/01/2016  . Class 3 severe obesity due to excess calories without serious comorbidity with body mass index (BMI) of 40.0 to 44.9 in adult ([place]) 10/01/2016  . High risk medication use 02/06/2016  . Family history of rheumatoid arthritis 02/06/2016  . H/O seasonal allergies 02/06/2016  . Rheumatoid arthritis of multiple sites with negative rheumatoid factor ([place]) 02/03/2016  . History of colon cancer 02/03/2016  . HLA B27 (HLA B27 positive) 02/03/2016   Donato Heinz. Owens Shark PT  Norwood Levo 11/07/2018, 3:50 PM  [place], [place], [address] Phone: [phone]   Fax:  [phone]  Name:  RASEEL JANS MRN: [id number] Date of Birth: 1975/12/31

## 2018-11-09 ENCOUNTER — Other Ambulatory Visit: Payer: Self-pay

## 2018-11-09 ENCOUNTER — Encounter: Payer: Self-pay | Admitting: Gastroenterology

## 2018-11-09 ENCOUNTER — Ambulatory Visit: Payer: BC Managed Care – PPO

## 2018-11-09 DIAGNOSIS — R209 Unspecified disturbances of skin sensation: Secondary | ICD-10-CM

## 2018-11-09 DIAGNOSIS — R262 Difficulty in walking, not elsewhere classified: Secondary | ICD-10-CM | POA: Diagnosis not present

## 2018-11-09 DIAGNOSIS — M6281 Muscle weakness (generalized): Secondary | ICD-10-CM | POA: Diagnosis not present

## 2018-11-09 NOTE — Therapy (Signed)
[place], [place], [address] Phone: [phone]   Fax:  [phone]  Physical Therapy Treatment  Patient Details  Name: Dana Hicks MRN: [id number] Date of Birth: 1975-07-29 Referring Provider (PT): Alvy Bimler    Encounter Date: 11/09/2018  PT End of Session - 11/09/18 1032    Visit Number  4    Number of Visits  17    Date for PT Re-Evaluation  12/29/18    PT Start Time  2263    PT Stop Time  1045    PT Time Calculation (min)  43 min    Activity Tolerance  Patient tolerated treatment well    Behavior During Therapy  [facility] for tasks assessed/performed       Past Medical History:  Diagnosis Date  . Allergic rhinitis, seasonal   . Cecal cancer ([place]) 2001   Stage II (T3N0)  04-11-2001cecum-partial colectomy and completed chemo 2002  . Endometrial cancer ([place]) 08/2017   Stage IA, Grade 1  . History of MRSA infection 01/2017   followed by infectious disease center--  recurrent pustular folliculitis  . Nephrolithiasis    bilateral nonobstructive calculi per CT 08-18-2017  . OA (osteoarthritis)   . Ovarian cancer, right ([place]) 08/2017   Stage II Grade 2 Endometrioid  . Pancreatic cancer ([place])   . Rheumatoid arthritis [facility])    rheumatologist-  dr devenswar-- treated w/ oral prednisone daily and methotrexate injection every 3 wks    Past Surgical History:  Procedure Laterality Date  . BREAST LUMPECTOMY WITH RADIOACTIVE SEED LOCALIZATION Left 06/28/2014   Benign Procedure: RADIOACTIVE SEED LOCALIZATION LEFT BREAST LUMPECTOMY;  Surgeon: Excell Seltzer, MD;  Location: [place];  Service: General;  Laterality: Left;  . COLONOSCOPY  06/19/14  . EYE SURGERY Left    plug in tear duct  . IR IMAGING GUIDED PORT INSERTION  09/26/2017  . KNEE ARTHROSCOPY    . PANCREATECTOMY  11/25/2017  . PORT A CATH REVISION  2001   in and out  . RIGHT COLECTOMY  06/02/2009   cecum cancer  . ROBOTIC  ASSISTED TOTAL HYSTERECTOMY WITH BILATERAL SALPINGO OOPHERECTOMY Bilateral 08/30/2017   Procedure: XI ROBOTIC ASSISTED TOTAL  HYSTERECTOMY BILATERAL  SALPINGO OOPHORECTOMY; LYSIS OF ADHESIONS;  Surgeon: Isabel Caprice, MD;  Location: [facility];  Service: Gynecology;  Laterality: Bilateral;  . SPLENECTOMY, PARTIAL  11/25/2017    There were no vitals filed for this visit.  Subjective Assessment - 11/09/18 1022    Subjective  I got my partial knee replacement surgery scheduled for Oct 5, I'll be so glad to have it done! I cancelled my remaining appts after that with you guys and I'll D/C before surgery bc thye have me scheduled to do PT with their office after surgery.    Pertinent History  colon cancer at 43 yo, with surgery and chemo  and was  cancer free for 19 years.  Developed Rheumatoid Arthritis 12 years ago and osteoarthritis ( was on humera for years, but had to come off that) she primarily has pain in large joints ( knees are horrible Rt>Lt that got worse with chemo, hopes to have Lt TKA as soon as she can) She has trouble sleeping alot She developed back pain and was found to have ovarian cancer and during the surgeries for this was found to have pancreatic cancer had removal of tail of pancrease, gall bladder  ( she has Lynch sydrome) and had many rounds of chemo .  She is 4 months out of chemo. Pt also had a PE during recovery and just came off blood thinners.    Patient Stated Goals  Pt wants to try to regain some of the strength she has lost , she wants to get strength so that she can fight her pancreatic cancer    Currently in Pain?  No/denies                       [facility] Adult PT Treatment/Exercise - 11/09/18 0001      Neuro Re-ed    Neuro Re-ed Details   In // bars: Heel-toe front and retro, toe walking, and slow/controlled high knee marching all done 2x each with +1 UE support, then on Airex for bil hip 3 way raises 2 x 10 each with pt returning therapist demo and VCs  throughout for technique, seated rest after then alternate marching on Airex 2 x 1 min      Knee/Hip Exercises: Aerobic   Nustep  Level 4 x8 mins, PTA present to monitor pts Rt knee pain throughout which did very well.       Knee/Hip Exercises: Machines for Strengthening   Total Gym Leg Press  25# 2x15, pt tolerated this very well with no increased pain (made sure weights were not too challenging so as not to overwork Rt knee)             PT Education - 11/09/18 1030    Education Details  Reissued supine scapular series in [place] and verbally reviewed these with pt while she was on NuStep    Person(s) Educated  Patient    Methods  Explanation;Demonstration;Handout    Comprehension  Verbalized understanding          PT Long Term Goals - 10/26/18 1513      PT LONG TERM GOAL #1   Title  Pt will report that she is able to do through her day at work wil 50% less exhaustion    Time  4    Period  Weeks    Status  New      PT LONG TERM GOAL #2   Title  Pt will increase both hip abductor strength to 4/4    Time  4    Period  Weeks    Status  New      PT LONG TERM GOAL #3   Title  Pt will reports her dynamic balance during the day is improved by 50%    Time  4    Period  Weeks    Status  New      PT LONG TERM GOAL #4   Title  Pt will be independent in an HEP to prepare for TKR including core strength    Time  4    Period  Weeks    Status  New            Plan - 11/09/18 1039    Clinical Impression Statement  Pt tolerated all strengthening and balance activities very well today without increased Rt knee pain. Her partial knee replacement surgery is scheduled for Oct 5, so focused on bil LE strength and balance today in preparation for her upcoming knee surgery.    Personal Factors and Comorbidities  Comorbidity 3+;Past/Current Experience    Comorbidities  colon, ovarian, and pancreatic cancer, RA, OA    Examination-Activity Limitations  Locomotion Level     Stability/Clinical Decision Making  Evolving/Moderate complexity  Rehab Potential  Good    PT Frequency  2x / week    PT Duration  8 weeks    PT Treatment/Interventions  ADLs/Self Care Home Management;Aquatic Therapy;Gait training;Stair training;Functional mobility training;Therapeutic activities;Therapeutic exercise;Balance training;Neuromuscular re-education;Patient/family education    PT Next Visit Plan  See how pt tolerated nu step (and new exercises done today) later in day and cont if was tolerable; for last few session focus on bil Le strength and balance in preparation for partial knee replacement surgery    PT Home Exercise Plan  Access Code: [email or web address]    Consulted and Agree with Plan of Care  Patient       Patient will benefit from skilled therapeutic intervention in order to improve the following deficits and impairments:  Abnormal gait, Decreased balance, Decreased endurance, Decreased mobility, Difficulty walking, Postural dysfunction, Pain, Impaired sensation  Visit Diagnosis: Muscle weakness (generalized)  Difficulty in walking, not elsewhere classified  Unspecified disturbances of skin sensation     Problem List Patient Active Problem List   Diagnosis Date Noted  . UTI (urinary tract infection) 06/29/2018  . Pulmonary embolism ([place]) 04/10/2018  . Diarrhea due to drug 03/28/2018  . Steroid-induced diabetes ([place]) 03/14/2018  . Neutropenic fever ([place]) 02/14/2018  . Acute rhinitis 02/14/2018  . Tachycardia 02/14/2018  . Physical debility 02/07/2018  . Hot flashes due to surgical menopause 02/06/2018  . Anemia due to antineoplastic chemotherapy 12/15/2017  . Hypomagnesemia 12/15/2017  . Iron deficiency anemia 12/15/2017  . S/P splenectomy 12/15/2017  . Lynch syndrome 11/22/2017  . Anterior leg pain, right 11/21/2017  . Genetic testing 11/18/2017  . Inflammatory arthritis 11/08/2017  . Dysuria 11/02/2017  . Peripheral neuropathy due to chemotherapy ([place])  10/22/2017  . Bone pain 10/21/2017  . Family history of colon cancer   . Pancreatic cancer ([place]) 10/17/2017  . Family history of uterine cancer 10/17/2017  . Sinusitis, chronic 09/15/2017  . Malabsorption due to disorder of pancreas 09/15/2017  . Endometrial cancer ([place]) 08/31/2017  . Secondary malignant neoplasm of right ovary ([place]) 08/31/2017  . Pustular folliculitis 02/72/5366  . MRSA colonization 05/12/2017  . Left foot pain 01/10/2017  . Osteoarthritis of both knees 12/01/2016  . Class 3 severe obesity due to excess calories without serious comorbidity with body mass index (BMI) of 40.0 to 44.9 in adult ([place]) 10/01/2016  . High risk medication use 02/06/2016  . Family history of rheumatoid arthritis 02/06/2016  . H/O seasonal allergies 02/06/2016  . Rheumatoid arthritis of multiple sites with negative rheumatoid factor ([place]) 02/03/2016  . History of colon cancer 02/03/2016  . HLA B27 (HLA B27 positive) 02/03/2016    Otelia Limes, PTA 11/09/2018, 11:01 AM  [place], [place], [address] Phone: [phone]   Fax:  [phone]  Name: Dana Hicks MRN: [id number] Date of Birth: August 11, 1975

## 2018-11-13 ENCOUNTER — Telehealth: Payer: Self-pay

## 2018-11-13 DIAGNOSIS — C252 Malignant neoplasm of tail of pancreas: Secondary | ICD-10-CM | POA: Diagnosis not present

## 2018-11-13 NOTE — Telephone Encounter (Signed)
Covid-19 screening questions   Do you now or have you had a fever in the last 14 days? NO   Do you have any respiratory symptoms of shortness of breath or cough now or in the last 14 days? NO  Do you have any family members or close contacts with diagnosed or suspected Covid-19 in the past 14 days? NO  Have you been tested for Covid-19 and found to be positive? NO        

## 2018-11-14 ENCOUNTER — Other Ambulatory Visit: Payer: Self-pay

## 2018-11-14 ENCOUNTER — Encounter: Payer: Self-pay | Admitting: Gastroenterology

## 2018-11-14 ENCOUNTER — Ambulatory Visit (AMBULATORY_SURGERY_CENTER): Payer: BC Managed Care – PPO | Admitting: Gastroenterology

## 2018-11-14 VITALS — BP 115/77 | HR 73 | Temp 98.3°F | Resp 15 | Ht 62.0 in | Wt 205.0 lb

## 2018-11-14 DIAGNOSIS — Z1211 Encounter for screening for malignant neoplasm of colon: Secondary | ICD-10-CM

## 2018-11-14 MED ORDER — SODIUM CHLORIDE 0.9 % IV SOLN: 500.0000 mL | Freq: Once | INTRAVENOUS | Status: DC

## 2018-11-14 NOTE — Progress Notes (Signed)
Temp check by JB/vital check by CW.  Medical and surgical history reviewed and updated.

## 2018-11-14 NOTE — Op Note (Signed)
[facility] Patient Name: Dana Hicks Procedure Date: 11/14/2018 10:30 AM MRN: [id number] Endoscopist: [name] , MD Age: 43 Referring MD:  Date of Birth: 1975/08/31 Gender: Female Account #: [id number] Procedure:                Colonoscopy Indications:              High risk colon cancer surveillance: Personal                            history of colon cancer; Age 22 colon cancer,                            genetically proven Lynch Syndrome, last colonoscopy                            1 year ago with [facility] Medicines:                Monitored Anesthesia Care Procedure:                Pre-Anesthesia Assessment:                           - Prior to the procedure, a History and Physical                            was performed, and patient medications and                            allergies were reviewed. The patient's tolerance of                            previous anesthesia was also reviewed. The risks                            and benefits of the procedure and the sedation                            options and risks were discussed with the patient.                            All questions were answered, and informed consent                            was obtained. Prior Anticoagulants: The patient has                            taken no previous anticoagulant or antiplatelet                            agents. ASA Grade Assessment: II - A patient with                            mild systemic disease. After reviewing the risks  and benefits, the patient was deemed in                            satisfactory condition to undergo the procedure.                           After obtaining informed consent, the colonoscope                            was passed under direct vision. Throughout the                            procedure, the patient's blood pressure, pulse, and                            oxygen saturations were monitored  continuously. The                            Colonoscope was introduced through the anus and                            advanced to the the ileocolonic anastomosis. The                            colonoscopy was performed without difficulty. The                            patient tolerated the procedure well. The quality                            of the bowel preparation was good. The rectum was                            photographed. Scope In: 10:35:16 AM Scope Out: 10:42:03 AM Scope Withdrawal Time: 0 hours 3 minutes 53 seconds  Total Procedure Duration: 0 hours 6 minutes 47 seconds  Findings:                 Right sided ileocolonic anastomosis was normal                            appearing.                           The entire examined colon appeared normal on direct                            and retroflexion views. Complications:            No immediate complications. Estimated blood loss:                            None. Estimated Blood Loss:     Estimated blood loss: none. Impression:               - The entire examined colon is normal on direct and  retroflexion views.                           - Right sided ileocolonic anastomosis was normal                            appearing. Recommendation:           - Patient has a contact number available for                            emergencies. The signs and symptoms of potential                            delayed complications were discussed with the                            patient. Return to normal activities tomorrow.                            Written discharge instructions were provided to the                            patient.                           - Resume previous diet.                           - Continue present medications.                           - Repeat colonoscopy in 1 year for surveillance                            given your Lynch Syndrome. [name], MD 11/14/2018  10:45:21 AM This report has been signed electronically.

## 2018-11-14 NOTE — Patient Instructions (Signed)
Thank you for allowing [place] to care for you today!  Recommend next screening colonoscopy in 1 year. Resume previous diet and medications today.  Return to your normal activities tomorrow.     YOU HAD AN ENDOSCOPIC PROCEDURE TODAY AT [facility]:   Refer to the procedure report that was given to you for any specific questions about what was found during the examination.  If the procedure report does not answer your questions, please call your gastroenterologist to clarify.  If you requested that your care partner not be given the details of your procedure findings, then the procedure report has been included in a sealed envelope for you to review at your convenience later.  YOU SHOULD EXPECT: Some feelings of bloating in the abdomen. Passage of more gas than usual.  Walking can help get rid of the air that was put into your GI tract during the procedure and reduce the bloating. If you had a lower endoscopy (such as a colonoscopy or flexible sigmoidoscopy) you may notice spotting of blood in your stool or on the toilet paper. If you underwent a bowel prep for your procedure, you may not have a normal bowel movement for a few days.  Please Note:  You might notice some irritation and congestion in your nose or some drainage.  This is from the oxygen used during your procedure.  There is no need for concern and it should clear up in a day or so.  SYMPTOMS TO REPORT IMMEDIATELY:   Following lower endoscopy (colonoscopy or flexible sigmoidoscopy):  Excessive amounts of blood in the stool  Significant tenderness or worsening of abdominal pains  Swelling of the abdomen that is new, acute  Fever of 100F or higher    For urgent or emergent issues, a gastroenterologist can be reached at any hour by calling [phone].   DIET:  We do recommend a small meal at first, but then you may proceed to your regular diet.  Drink plenty of fluids but you should avoid alcoholic beverages for 24  hours.  ACTIVITY:  You should plan to take it easy for the rest of today and you should NOT DRIVE or use heavy machinery until tomorrow (because of the sedation medicines used during the test).    FOLLOW UP: Our staff will call the number listed on your records 48-72 hours following your procedure to check on you and address any questions or concerns that you may have regarding the information given to you following your procedure. If we do not reach you, we will leave a message.  We will attempt to reach you two times.  During this call, we will ask if you have developed any symptoms of COVID 19. If you develop any symptoms (ie: fever, flu-like symptoms, shortness of breath, cough etc.) before then, please call [phone].  If you test positive for Covid 19 in the 2 weeks post procedure, please call and report this information to [place].    If any biopsies were taken you will be contacted by phone or by letter within the next 1-3 weeks.  Please call us at [phone] if you have not heard about the biopsies in 3 weeks.    SIGNATURES/CONFIDENTIALITY: You and/or your care partner have signed paperwork which will be entered into your electronic medical record.  These signatures attest to the fact that that the information above on your After Visit Summary has been reviewed and is understood.  Full responsibility of the confidentiality of  this discharge information lies with you and/or your care-partner.

## 2018-11-14 NOTE — Progress Notes (Signed)
To PACU, VSS. Report to [email or web address] 

## 2018-11-16 ENCOUNTER — Ambulatory Visit: Payer: BC Managed Care – PPO

## 2018-11-16 ENCOUNTER — Telehealth: Payer: Self-pay | Admitting: *Deleted

## 2018-11-16 ENCOUNTER — Telehealth: Payer: Self-pay

## 2018-11-16 ENCOUNTER — Other Ambulatory Visit: Payer: Self-pay

## 2018-11-16 DIAGNOSIS — R209 Unspecified disturbances of skin sensation: Secondary | ICD-10-CM | POA: Diagnosis not present

## 2018-11-16 DIAGNOSIS — M6281 Muscle weakness (generalized): Secondary | ICD-10-CM | POA: Diagnosis not present

## 2018-11-16 DIAGNOSIS — R262 Difficulty in walking, not elsewhere classified: Secondary | ICD-10-CM | POA: Diagnosis not present

## 2018-11-16 NOTE — Therapy (Addendum)
[place], [place], [address] Phone: [phone]   Fax:  [phone]  Physical Therapy Treatment  Patient Details  Name: Dana Hicks MRN: [id number] Date of Birth: [birth year] Referring Provider (PT): Alvy Bimler    Encounter Date: 11/16/2018  PT End of Session - 11/16/18 1150    Visit Number  5    Number of Visits  17    Date for PT Re-Evaluation  12/29/18    PT Start Time  1104    PT Stop Time  1150    PT Time Calculation (min)  46 min    Activity Tolerance  Patient tolerated treatment well    Behavior During Therapy  [facility] for tasks assessed/performed       Past Medical History:  Diagnosis Date  . Allergic rhinitis, seasonal   . Cecal cancer ([place]) 2001   Stage II (T3N0)  04-11-2001cecum-partial colectomy and completed chemo 2002  . Endometrial cancer ([place]) 08/2017   Stage IA, Grade 1  . History of MRSA infection 01/2017   followed by infectious disease center--  recurrent pustular folliculitis  . Nephrolithiasis    bilateral nonobstructive calculi per CT 08-18-2017  . OA (osteoarthritis)   . Ovarian cancer, right ([place]) 08/2017   Stage II Grade 2 Endometrioid  . Pancreatic cancer ([place])   . Rheumatoid arthritis [facility])    rheumatologist-  dr devenswar-- treated w/ oral prednisone daily and methotrexate injection every 3 wks    Past Surgical History:  Procedure Laterality Date  . BREAST LUMPECTOMY WITH RADIOACTIVE SEED LOCALIZATION Left 06/28/2014   Benign Procedure: RADIOACTIVE SEED LOCALIZATION LEFT BREAST LUMPECTOMY;  Surgeon: Excell Seltzer, MD;  Location: [facility];  Service: General;  Laterality: Left;  . COLONOSCOPY  06/19/14  . EYE SURGERY Left    plug in tear duct  . IR IMAGING GUIDED PORT INSERTION  09/26/2017  . KNEE ARTHROSCOPY    . PANCREATECTOMY  11/25/2017  . PORT A CATH REVISION  2001   in and out  . RIGHT COLECTOMY  06/02/2009   cecum cancer  . ROBOTIC  ASSISTED TOTAL HYSTERECTOMY WITH BILATERAL SALPINGO OOPHERECTOMY Bilateral 08/30/2017   Procedure: XI ROBOTIC ASSISTED TOTAL  HYSTERECTOMY BILATERAL  SALPINGO OOPHORECTOMY; LYSIS OF ADHESIONS;  Surgeon: Isabel Caprice, MD;  Location: [facility];  Service: Gynecology;  Laterality: Bilateral;  . SPLENECTOMY, PARTIAL  11/25/2017    There were no vitals filed for this visit.  Subjective Assessment - 11/16/18 1110    Subjective  I felt okay after last session and my knees were just a little aore the next day. I don't think we did too much.    Pertinent History  colon cancer at 43 yo, with surgery and chemo  and was  cancer free for 19 years.  Developed Rheumatoid Arthritis 12 years ago and osteoarthritis ( was on humera for years, but had to come off that) she primarily has pain in large joints ( knees are horrible Rt>Lt that got worse with chemo, hopes to have Lt TKA as soon as she can) She has trouble sleeping alot She developed back pain and was found to have ovarian cancer and during the surgeries for this was found to have pancreatic cancer had removal of tail of pancrease, gall bladder  ( she has Lynch sydrome) and had many rounds of chemo . She is 4 months out of chemo. Pt also had a PE during recovery and just came off blood thinners.  Patient Stated Goals  Pt wants to try to regain some of the strength she has lost , she wants to get strength so that she can fight her pancreatic cancer    Currently in Pain?  Yes    Pain Score  4     Pain Location  Knee    Pain Orientation  Left    Pain Descriptors / Indicators  Aching;Dull    Pain Type  Chronic pain    Pain Onset  More than a month ago    Pain Frequency  Intermittent    Aggravating Factors   walking    Pain Relieving Factors  elevate it and rest         [facility] PT Assessment - 11/16/18 0001      Strength   Right Hip ABduction  4/5    Left Hip ABduction  4-/5                   [facility] Adult PT Treatment/Exercise - 11/16/18  0001      Neuro Re-ed    Neuro Re-ed Details   In // bars: Heel-toe front and retro, toe walking, and slow/controlled high knee marching all done 2x each with +1 UE support, then on Airex for bil hip 3 way raises 2 x 10 each with pt returning therapist demo and VCs throughout for technique, then alternate marching on Airex x 1 min      Knee/Hip Exercises: Aerobic   Nustep  Level 5 x8 mins      Knee/Hip Exercises: Machines for Strengthening   Total Gym Leg Press  25#, 2x15      Knee/Hip Exercises: Supine   Quad Sets  Strengthening;Left;10 reps    Bridges  Strengthening;Both;5 reps    Straight Leg Raises  Strengthening;Left;5 reps                  PT Long Term Goals - 11/16/18 1143      PT LONG TERM GOAL #1   Title  Pt will report that she is able to do through her day at work wil 50% less exhaustion    Baseline  Pt reports no change at this time, but alot of this is due to limitations from Lt knee- 11/16/18    Status  Not Met      PT LONG TERM GOAL #2   Title  Pt will increase both hip abductor strength to 4/4    Baseline  (done in standing) Lt 4-/5, Rt 4/5 - 11/16/18    Status  Partially Met      PT LONG TERM GOAL #3   Title  Pt will reports her dynamic balance during the day is improved by 50%    Baseline  Pt reports minimal change due to weakness of Lt knee (and some Rt) which affects LE strength-11/16/18    Status  Not Met      PT LONG TERM GOAL #4   Title  Pt will be independent in an HEP to prepare for TKR including core strength    Baseline  Pt is independent with this- 11/16/18    Status  Achieved            Plan - 11/16/18 1150    Clinical Impression Statement  Pt has done well with becoming independent with HEP for core and LE strength to better prepare her for upcoming Lt partial knee replacement next week. She did not meet remaining goals she reports mostly due  to Lt knee pain and weakness limiting her throughout her day. Will D/C pt at this time as  she will be having PT at office of her surgeon.    Personal Factors and Comorbidities  Comorbidity 3+;Past/Current Experience    Comorbidities  colon, ovarian, and pancreatic cancer, RA, OA    Examination-Activity Limitations  Locomotion Level    Stability/Clinical Decision Making  Evolving/Moderate complexity    Rehab Potential  Good    PT Frequency  2x / week    PT Duration  8 weeks    PT Treatment/Interventions  ADLs/Self Care Home Management;Aquatic Therapy;Gait training;Stair training;Functional mobility training;Therapeutic activities;Therapeutic exercise;Balance training;Neuromuscular re-education;Patient/family education    PT Next Visit Plan  D/C this visit.    PT Home Exercise Plan  Access Code: [email or web address]    Consulted and Agree with Plan of Care  Patient       Patient will benefit from skilled therapeutic intervention in order to improve the following deficits and impairments:  Abnormal gait, Decreased balance, Decreased endurance, Decreased mobility, Difficulty walking, Postural dysfunction, Pain, Impaired sensation  Visit Diagnosis: Muscle weakness (generalized)  Difficulty in walking, not elsewhere classified  Unspecified disturbances of skin sensation     Problem List Patient Active Problem List   Diagnosis Date Noted  . UTI (urinary tract infection) 06/29/2018  . Pulmonary embolism ([place]) 04/10/2018  . Diarrhea due to drug 03/28/2018  . Steroid-induced diabetes ([place]) 03/14/2018  . Neutropenic fever ([place]) 02/14/2018  . Acute rhinitis 02/14/2018  . Tachycardia 02/14/2018  . Physical debility 02/07/2018  . Hot flashes due to surgical menopause 02/06/2018  . Anemia due to antineoplastic chemotherapy 12/15/2017  . Hypomagnesemia 12/15/2017  . Iron deficiency anemia 12/15/2017  . S/P splenectomy 12/15/2017  . Lynch syndrome 11/22/2017  . Anterior leg pain, right 11/21/2017  . Genetic testing 11/18/2017  . Inflammatory arthritis 11/08/2017  . Dysuria 11/02/2017   . Peripheral neuropathy due to chemotherapy ([place]) 10/22/2017  . Bone pain 10/21/2017  . Family history of colon cancer   . Pancreatic cancer ([place]) 10/17/2017  . Family history of uterine cancer 10/17/2017  . Sinusitis, chronic 09/15/2017  . Malabsorption due to disorder of pancreas 09/15/2017  . Endometrial cancer ([place]) 08/31/2017  . Secondary malignant neoplasm of right ovary ([place]) 08/31/2017  . Pustular folliculitis 54/10/8117  . MRSA colonization 05/12/2017  . Left foot pain 01/10/2017  . Osteoarthritis of both knees 12/01/2016  . Class 3 severe obesity due to excess calories without serious comorbidity with body mass index (BMI) of 40.0 to 44.9 in adult ([place]) 10/01/2016  . High risk medication use 02/06/2016  . Family history of rheumatoid arthritis 02/06/2016  . H/O seasonal allergies 02/06/2016  . Rheumatoid arthritis of multiple sites with negative rheumatoid factor ([place]) 02/03/2016  . History of colon cancer 02/03/2016  . HLA B27 (HLA B27 positive) 02/03/2016    Otelia Limes, PTA 11/16/2018, 11:53 AM  [place], [place], [address] Phone: [phone]   Fax:  [phone]  Name: Dana Hicks MRN: [id number] Date of Birth: 11/24/1975  PHYSICAL THERAPY DISCHARGE SUMMARY  Visits from Start of Care: 5  Current functional level related to goals / functional outcomes: As above    Remaining deficits: As above    Education / Equipment: Home exercise program  Plan: Patient agrees to discharge.  Patient goals were partially met. Patient is being discharged due to the patient's request.  ?????    Maudry Diego, PT 11/16/18  6:22 PM

## 2018-11-16 NOTE — Telephone Encounter (Signed)
No answer for post procedure call back.left message and will call back this afternoon. SM

## 2018-11-16 NOTE — Telephone Encounter (Signed)
  Follow up Call-  Call back number 11/14/2018  Post procedure Call Back phone  # [phone]  Permission to leave phone message Yes  Some recent data might be hidden     Patient questions:  Do you have a fever, pain , or abdominal swelling? No. Pain Score  0 *  Have you tolerated food without any problems? Yes.    Have you been able to return to your normal activities? Yes.    Do you have any questions about your discharge instructions: Diet   No. Medications  No. Follow up visit  No.  Do you have questions or concerns about your Care? No.  Actions: * If pain score is 4 or above: No action needed, pain <4.  1. Have you developed a fever since your procedure? no  2.   Have you had an respiratory symptoms (SOB or cough) since your procedure? no  3.   Have you tested positive for COVID 19 since your procedure no  4.   Have you had any family members/close contacts diagnosed with the COVID 19 since your procedure?  no   If yes to any of these questions please route to Joylene John, RN and Alphonsa Gin, Therapist, sports.

## 2018-11-20 DIAGNOSIS — R0681 Apnea, not elsewhere classified: Secondary | ICD-10-CM | POA: Diagnosis not present

## 2018-11-20 DIAGNOSIS — R0683 Snoring: Secondary | ICD-10-CM | POA: Diagnosis not present

## 2018-11-20 NOTE — Patient Instructions (Addendum)
DUE TO COVID-19 ONLY ONE VISITOR IS ALLOWED TO COME WITH YOU AND STAY IN THE WAITING ROOM ONLY DURING PRE OP AND PROCEDURE DAY OF SURGERY.  THE 1 VISITOR MAY VISIT WITH YOU AFTER SURGERY IN YOUR PRIVATE ROOM DURING VISITING HOURS ONLY!   YOU NEED TO HAVE A COVID 19 TEST ON__10/1_____ @___12 :00____, THIS TEST MUST BE DONE BEFORE SURGERY, COME  [address], [place] , [address].  ([place])  ONCE YOUR COVID TEST IS COMPLETED, PLEASE BEGIN THE QUARANTINE INSTRUCTIONS AS OUTLINED IN YOUR HANDOUT.                Dana Hicks   Your procedure is scheduled on:11/27/18   Report to [facility]  Entrance   Report to admitting at   7:27 AM     Call this number if you have problems the morning of surgery [place], NO CHEWING GUM [place].   Do not eat food After Midnight.    Take these medicines the morning of surgery with A SIP OF WATER: GABAPENTIN, CELEXA, PREDNISONE,PRILOSEC  DO NOT TAKE ANY DIABETIC MEDICATIONS DAY OF YOUR SURGERY     How to Manage Your Diabetes Before and After Surgery  Why is it important to control my blood sugar before and after surgery? . Improving blood sugar levels before and after surgery helps healing and can limit problems. . A way of improving blood sugar control is eating a healthy diet by: o  Eating less sugar and carbohydrates o  Increasing activity/exercise o  Talking with your doctor about reaching your blood sugar goals . High blood sugars (greater than 180 mg/dL) can raise your risk of infections and slow your recovery, so you will need to focus on controlling your diabetes during the weeks before surgery. . Make sure that the doctor who takes care of your diabetes knows about your planned surgery including the date and location.  How do I manage my blood sugar before surgery? . Check your blood sugar at least 4 times a day, starting  2 days before surgery, to make sure that the level is not too high or low. o Check your blood sugar the morning of your surgery when you wake up and every 2 hours until you get to the Short Stay unit. . If your blood sugar is less than 70 mg/dL, you will need to treat for low blood sugar: o Do not take insulin. o Treat a low blood sugar (less than 70 mg/dL) with  cup of clear juice (cranberry or apple), 4 glucose tablets, OR glucose gel. o Recheck blood sugar in 15 minutes after treatment (to make sure it is greater than 70 mg/dL). If your blood sugar is not greater than 70 mg/dL on recheck, call [phone] for further instructions. . Report your blood sugar to the short stay nurse when you get to Short Stay.  . If you are admitted to the hospital after surgery: o Your blood sugar will be checked by the staff and you will probably be given insulin after surgery (instead of oral diabetes medicines) to make sure you have good blood sugar levels. o The goal for blood sugar control after surgery is 80-180 mg/dL.   WHAT DO I DO ABOUT MY DIABETES MEDICATION?  Marland Kitchen Do not take oral diabetes medicines (pills) the morning of surgery. Marland Kitchen  You may not have any metal on your body including hair pins and              piercings              Do not wear jewelry, make-up, lotions, powders or perfumes, deodorant             Do not wear nail polish on your fingernails.  Do not shave  48 hours prior to surgery.     Do not bring valuables to the hospital. [place].  Contacts, dentures or bridgework may not be worn into surgery.      Patients discharged the day of surgery will not be allowed to drive home.  IF YOU ARE HAVING SURGERY AND GOING HOME THE SAME DAY, YOU MUST HAVE AN ADULT TO DRIVE YOU HOME AND BE WITH YOU FOR 24 HOURS.  YOU MAY GO HOME BY TAXI OR UBER OR ORTHERWISE, BUT AN ADULT MUST ACCOMPANY YOU HOME AND STAY WITH YOU FOR 24  HOURS.  Name and phone number of your driver:  Special Instructions: N/A              Please read over the following fact sheets you were given: _____________________________________________________________________             [facility] - Preparing for Surgery  Before surgery, you can play an important role.   Because skin is not sterile, your skin needs to be as free of germs as possible.   You can reduce the number of germs on your skin by washing with CHG (chlorahexidine gluconate) soap before surgery.   CHG is an antiseptic cleaner which kills germs and bonds with the skin to continue killing germs even after washing. Please DO NOT use if you have an allergy to CHG or antibacterial soaps.   If your skin becomes reddened/irritated stop using the CHG and inform your nurse when you arrive at Short Stay. Do not shave (including legs and underarms) for at least 48 hours prior to the first CHG shower.  Please follow these instructions carefully:  1.  Shower with CHG Soap the night before surgery and the  morning of Surgery.  2.  If you choose to wash your hair, wash your hair first as usual with your  normal  shampoo.  3.  After you shampoo, rinse your hair and body thoroughly to remove the  shampoo.                                        4.  Use CHG as you would any other liquid soap.  You can apply chg directly  to the skin and wash                       Gently with a scrungie or clean washcloth.  5.  Apply the CHG Soap to your body ONLY FROM THE NECK DOWN.   Do not use on face/ open                           Wound or open sores. Avoid contact with eyes, ears mouth and genitals (private parts).  Wash face,  Genitals (private parts) with your normal soap.             6.  Wash thoroughly, paying special attention to the area where your surgery  will be performed.  7.  Thoroughly rinse your body with warm water from the neck down.  8.  DO NOT shower/wash with your  normal soap after using and rinsing off  the CHG Soap.            9.  Pat yourself dry with a clean towel.            10.  Wear clean pajamas.            11.  Place clean sheets on your bed the night of your first shower and do not  sleep with pets. Day of Surgery : Do not apply any lotions/deodorants the morning of surgery.  Please wear clean clothes to the hospital/surgery center.  FAILURE TO FOLLOW THESE INSTRUCTIONS MAY RESULT IN THE CANCELLATION OF YOUR SURGERY PATIENT SIGNATURE_________________________________  NURSE SIGNATURE__________________________________  ________________________________________________________________________   Adam Phenix  An incentive spirometer is a tool that can help keep your lungs clear and active. This tool measures how well you are filling your lungs with each breath. Taking long deep breaths may help reverse or decrease the chance of developing breathing (pulmonary) problems (especially infection) following:  A long period of time when you are unable to move or be active. BEFORE THE PROCEDURE   If the spirometer includes an indicator to show your best effort, your nurse or respiratory therapist will set it to a desired goal.  If possible, sit up straight or lean slightly forward. Try not to slouch.  Hold the incentive spirometer in an upright position. INSTRUCTIONS FOR USE  1. Sit on the edge of your bed if possible, or sit up as far as you can in bed or on a chair. 2. Hold the incentive spirometer in an upright position. 3. Breathe out normally. 4. Place the mouthpiece in your mouth and seal your lips tightly around it. 5. Breathe in slowly and as deeply as possible, raising the piston or the ball toward the top of the column. 6. Hold your breath for 3-5 seconds or for as long as possible. Allow the piston or ball to fall to the bottom of the column. 7. Remove the mouthpiece from your mouth and breathe out normally. 8. Rest for a few  seconds and repeat Steps 1 through 7 at least 10 times every 1-2 hours when you are awake. Take your time and take a few normal breaths between deep breaths. 9. The spirometer may include an indicator to show your best effort. Use the indicator as a goal to work toward during each repetition. 10. After each set of 10 deep breaths, practice coughing to be sure your lungs are clear. If you have an incision (the cut made at the time of surgery), support your incision when coughing by placing a pillow or rolled up towels firmly against it. Once you are able to get out of bed, walk around indoors and cough well. You may stop using the incentive spirometer when instructed by your caregiver.  RISKS AND COMPLICATIONS  Take your time so you do not get dizzy or light-headed.  If you are in pain, you may need to take or ask for pain medication before doing incentive spirometry. It is harder to take a deep breath if you are having pain. AFTER USE  Rest and breathe slowly and easily.  It can be helpful to keep track of a log of your progress. Your caregiver can provide you with a simple table to help with this. If you are using the spirometer at home, follow these instructions: [place] IF:   You are having difficultly using the spirometer.  You have trouble using the spirometer as often as instructed.  Your pain medication is not giving enough relief while using the spirometer.  You develop fever of 100.5 F (38.1 C) or higher. SEEK IMMEDIATE MEDICAL CARE IF:   You cough up bloody sputum that had not been present before.  You develop fever of 102 F (38.9 C) or greater.  You develop worsening pain at or near the incision site. MAKE SURE YOU:   Understand these instructions.  Will watch your condition.  Will get help right away if you are not doing well or get worse. Document Released: 06/21/2006 Document Revised: 05/03/2011 Document Reviewed: 08/22/2006 [facility] Patient  Information 2014 [place], [place].   ________________________________________________________________________

## 2018-11-21 ENCOUNTER — Encounter (HOSPITAL_COMMUNITY)
Admission: RE | Admit: 2018-11-21 | Discharge: 2018-11-21 | Disposition: A | Discharge status: Home / Self Care | Payer: BC Managed Care – PPO | Source: Ambulatory Visit | Attending: Orthopedic Surgery | Admitting: Orthopedic Surgery

## 2018-11-21 ENCOUNTER — Other Ambulatory Visit: Payer: Self-pay

## 2018-11-21 ENCOUNTER — Encounter (HOSPITAL_COMMUNITY): Payer: Self-pay

## 2018-11-21 DIAGNOSIS — Z79899 Other long term (current) drug therapy: Secondary | ICD-10-CM | POA: Insufficient documentation

## 2018-11-21 DIAGNOSIS — M069 Rheumatoid arthritis, unspecified: Secondary | ICD-10-CM | POA: Insufficient documentation

## 2018-11-21 DIAGNOSIS — E118 Type 2 diabetes mellitus with unspecified complications: Secondary | ICD-10-CM | POA: Diagnosis not present

## 2018-11-21 DIAGNOSIS — Z87891 Personal history of nicotine dependence: Secondary | ICD-10-CM | POA: Insufficient documentation

## 2018-11-21 DIAGNOSIS — Z01812 Encounter for preprocedural laboratory examination: Secondary | ICD-10-CM | POA: Insufficient documentation

## 2018-11-21 DIAGNOSIS — Z7984 Long term (current) use of oral hypoglycemic drugs: Secondary | ICD-10-CM | POA: Diagnosis not present

## 2018-11-21 DIAGNOSIS — K219 Gastro-esophageal reflux disease without esophagitis: Secondary | ICD-10-CM | POA: Insufficient documentation

## 2018-11-21 HISTORY — DX: Gastro-esophageal reflux disease without esophagitis: K21.9

## 2018-11-21 HISTORY — DX: Depression, unspecified: F32.A

## 2018-11-21 HISTORY — DX: Type 2 diabetes mellitus without complications: E11.9

## 2018-11-21 LAB — CBC
HCT: 35.2 % — ABNORMAL LOW (ref 36.0–46.0)
Hemoglobin: 10.7 g/dL — ABNORMAL LOW (ref 12.0–15.0)
MCH: 27.4 pg (ref 26.0–34.0)
MCHC: 30.4 g/dL (ref 30.0–36.0)
MCV: 90 fL (ref 80.0–100.0)
Platelets: 631 10*3/uL — ABNORMAL HIGH (ref 150–400)
RBC: 3.91 MIL/uL (ref 3.87–5.11)
RDW: 22.3 % — ABNORMAL HIGH (ref 11.5–15.5)
WBC: 14.6 10*3/uL — ABNORMAL HIGH (ref 4.0–10.5)
nRBC: 0 % (ref 0.0–0.2)

## 2018-11-21 LAB — BASIC METABOLIC PANEL
Anion gap: 11 (ref 5–15)
BUN: 12 mg/dL (ref 6–20)
CO2: 26 mmol/L (ref 22–32)
Calcium: 8.9 mg/dL (ref 8.9–10.3)
Chloride: 100 mmol/L (ref 98–111)
Creatinine, Ser: 0.57 mg/dL (ref 0.44–1.00)
GFR calc Af Amer: >60 mL/min (ref >60)
GFR calc non Af Amer: >60 mL/min (ref >60)
Glucose, Bld: 120 mg/dL — ABNORMAL HIGH (ref 70–99)
Potassium: 3.8 mmol/L (ref 3.5–5.1)
Sodium: 137 mmol/L (ref 135–145)

## 2018-11-21 LAB — HEMOGLOBIN A1C
Hgb A1c MFr Bld: 5.8 % — ABNORMAL HIGH (ref 4.8–5.6)
Mean Plasma Glucose: 119.76 mg/dL

## 2018-11-21 LAB — GLUCOSE, CAPILLARY: Glucose-Capillary: 107 mg/dL — ABNORMAL HIGH (ref 70–99)

## 2018-11-21 LAB — SURGICAL PCR SCREEN
MRSA, PCR: NEGATIVE
Staphylococcus aureus: NEGATIVE

## 2018-11-21 NOTE — Progress Notes (Signed)
PCP - Dr/ A. Morrow Cardiologist - none  Chest x-ray - 10/16/18 EKG - 11/02/18 Stress Test - no ECHO - no Cardiac Cath - no  Sleep Study - no but pt feels that she has sleep apnea and plans to has a test soon CPAP - no  Fasting Blood Sugar - 100 Checks Blood Sugar __3___ times a day  Blood Thinner Instructions:NA Aspirin Instructions: Last Dose:  Anesthesia review:   Patient denies shortness of breath, fever, cough and chest pain at PAT appointment yes  Patient verbalized understanding of instructions that were given to them at the PAT appointment. Patient was also instructed that they will need to review over the PAT instructions again at home before surgery. Yes Pt reports that she had a blood clot after her pancreas surgery. She has a port -a-cath.

## 2018-11-22 NOTE — Progress Notes (Signed)
Anesthesia Chart Review   Case: [id number] Date/Time: 11/27/18 0940   Procedure: UNICOMPARTMENTAL KNEE (Left Knee)   Anesthesia type: Spinal   Pre-op diagnosis: LT. KNEE OSTEOARTHRITIS   Location: [facility]   Surgeon: Vickey Huger, MD      DISCUSSION:43 y.o. former smoker (5 pack years, quit 02/05/98) with h/o RA, DM II, endometrial cancer, right ovarian cancer, pancreatic caner, cecal caner, port-a-cath in place, DVT s/p distal pancreatectomy and open splenectomy 11/25/2017 (has completed 6 month anticoagulation), left knee OA scheduled for above procedure 11/27/2018 with Dr. Vickey Huger.   Pt last seen by hematology and oncology, she has completed 6 months of anticoagulation.   Pt advised by rheumatology to discontinue methotrexate one week prior to surgery and Orencia 1 month prior to procedure.  May resume 2 weeks after the surgery if no infection.   Anticipate pt can proceed with planned procedure barring acute status change.   VS: BP 135/87 (BP Location: Left Arm)   Pulse 74   Temp 37.4 C (Oral)   Resp 18   Ht 5\' 2"  (1.575 m)   Wt 92.3 kg   LMP 08/30/2017   SpO2 100%   BMI 37.21 kg/m   PROVIDERS: [name], MD is PCP   Heath Lark, MD w/Hematology/Oncology   Marcelle Smiling, MD is Rheumatologist  LABS: Labs reviewed: Acceptable for surgery. (all labs ordered are listed, but only abnormal results are displayed)  Labs Reviewed  GLUCOSE, CAPILLARY - Abnormal; Notable for the following components:      Result Value   Glucose-Capillary 107 (*)    All other components within normal limits  HEMOGLOBIN A1C - Abnormal; Notable for the following components:   Hgb A1c MFr Bld 5.8 (*)    All other components within normal limits  BASIC METABOLIC PANEL - Abnormal; Notable for the following components:   Glucose, Bld 120 (*)    All other components within normal limits  CBC - Abnormal; Notable for the following components:   WBC 14.6 (*)    Hemoglobin 10.7  (*)    HCT 35.2 (*)    RDW 22.3 (*)    Platelets 631 (*)    All other components within normal limits  SURGICAL PCR SCREEN     IMAGES: CT Chest W Contrast 10/16/2018 IMPRESSION: Status post distal pancreatectomy. Stable postoperative seroma along the surgical margin.  Status post hysterectomy and right oophorectomy.  Status post right hemicolectomy with appendectomy.  No evidence of recurrent or metastatic disease.  No colonic wall thickening or mass is evident on CT.  EKG: 01/25/2018 Normal sinus rhythm Normal ECG No significant change since last tracing  CV:  Past Medical History:  Diagnosis Date  . Allergic rhinitis, seasonal   . Cecal cancer ([place]) 2001   Stage II (T3N0)  04-11-2001cecum-partial colectomy and completed chemo 2002  . Depression   . Diabetes mellitus without complication ([place])   . Endometrial cancer ([place]) 08/2017   Stage IA, Grade 1  . GERD (gastroesophageal reflux disease)   . History of MRSA infection 01/2017   followed by infectious disease center--  recurrent pustular folliculitis  . Nephrolithiasis    bilateral nonobstructive calculi per CT 08-18-2017  . OA (osteoarthritis)   . Ovarian cancer, right ([place]) 08/2017   Stage II Grade 2 Endometrioid  . Pancreatic cancer ([place])   . Rheumatoid arthritis [facility])    rheumatologist-  dr devenswar-- treated w/ oral prednisone daily and methotrexate injection every 3 wks  Past Surgical History:  Procedure Laterality Date  . BREAST LUMPECTOMY WITH RADIOACTIVE SEED LOCALIZATION Left 06/28/2014   Benign Procedure: RADIOACTIVE SEED LOCALIZATION LEFT BREAST LUMPECTOMY;  Surgeon: Excell Seltzer, MD;  Location: [facility];  Service: General;  Laterality: Left;  . COLONOSCOPY  06/19/14  . EYE SURGERY Left    plug in tear duct  . IR IMAGING GUIDED PORT INSERTION  09/26/2017  . KNEE ARTHROSCOPY    . PANCREATECTOMY  11/25/2017  . PORT A CATH REVISION  2001   in and out  . RIGHT COLECTOMY   06/02/2009   cecum cancer  . ROBOTIC ASSISTED TOTAL HYSTERECTOMY WITH BILATERAL SALPINGO OOPHERECTOMY Bilateral 08/30/2017   Procedure: XI ROBOTIC ASSISTED TOTAL  HYSTERECTOMY BILATERAL  SALPINGO OOPHORECTOMY; LYSIS OF ADHESIONS;  Surgeon: Isabel Caprice, MD;  [facility];  Service: Gynecology;  Laterality: Bilateral;  . SPLENECTOMY, PARTIAL  11/25/2017    MEDICATIONS: . acetaminophen (TYLENOL) 650 MG CR tablet  . citalopram (CELEXA) 20 MG tablet  . diclofenac sodium (VOLTAREN) 1 % GEL  . folic acid (FOLVITE) 1 MG tablet  . gabapentin (NEURONTIN) 300 MG capsule  . lidocaine-prilocaine (EMLA) cream  . lipase/protease/amylase (CREON) 12000 units CPEP capsule  . loratadine (CLARITIN) 10 MG tablet  . metFORMIN (GLUCOPHAGE) 500 MG tablet  . methocarbamol (ROBAXIN) 500 MG tablet  . Methotrexate, PF, 20 MG/0.4ML SOAJ  . Multiple Vitamin (MULTIVITAMIN WITH MINERALS) TABS tablet  . naproxen sodium (ALEVE) 220 MG tablet  . omeprazole (PRILOSEC) 20 MG capsule  . ORENCIA CLICKJECT 0000000 MG/ML SOAJ  . predniSONE (DELTASONE) 5 MG tablet  . simethicone (MYLICON) 0000000 MG chewable tablet  . traMADol (ULTRAM) 50 MG tablet  . triamcinolone (NASACORT) 55 MCG/ACT AERO nasal inhaler   No current facility-administered medications for this encounter.     Maia Plan [facility] Pre-Surgical Testing [phone] 11/22/18  12:25 PM

## 2018-11-23 ENCOUNTER — Other Ambulatory Visit (HOSPITAL_COMMUNITY)
Admission: RE | Admit: 2018-11-23 | Discharge: 2018-11-23 | Disposition: A | Discharge status: Home / Self Care | Payer: BC Managed Care – PPO | Source: Ambulatory Visit | Attending: Orthopedic Surgery | Admitting: Orthopedic Surgery

## 2018-11-23 DIAGNOSIS — Z20828 Contact with and (suspected) exposure to other viral communicable diseases: Secondary | ICD-10-CM | POA: Insufficient documentation

## 2018-11-24 LAB — NOVEL CORONAVIRUS, NAA (HOSP ORDER, SEND-OUT TO REF LAB; TAT 18-24 HRS): SARS-CoV-2, NAA: NOT DETECTED

## 2018-11-25 ENCOUNTER — Other Ambulatory Visit: Payer: Self-pay | Admitting: Rheumatology

## 2018-11-27 ENCOUNTER — Other Ambulatory Visit: Payer: Self-pay

## 2018-11-27 ENCOUNTER — Encounter (HOSPITAL_COMMUNITY): Payer: Self-pay

## 2018-11-27 ENCOUNTER — Encounter (HOSPITAL_COMMUNITY)
Admission: RE | Disposition: A | Discharge status: Home / Self Care | Payer: Self-pay | Source: Home / Self Care | Attending: Orthopedic Surgery

## 2018-11-27 ENCOUNTER — Telehealth (HOSPITAL_COMMUNITY): Payer: Self-pay | Admitting: *Deleted

## 2018-11-27 ENCOUNTER — Ambulatory Visit (HOSPITAL_COMMUNITY): Payer: BC Managed Care – PPO | Admitting: Physician Assistant

## 2018-11-27 ENCOUNTER — Ambulatory Visit (HOSPITAL_COMMUNITY): Payer: BC Managed Care – PPO | Admitting: Certified Registered"

## 2018-11-27 ENCOUNTER — Other Ambulatory Visit: Payer: Self-pay | Admitting: Orthopedic Surgery

## 2018-11-27 ENCOUNTER — Observation Stay (HOSPITAL_COMMUNITY)
Admission: RE | Admit: 2018-11-27 | Discharge: 2018-11-28 | Disposition: A | Discharge status: Home / Self Care | Payer: BC Managed Care – PPO | Attending: Orthopedic Surgery | Admitting: Orthopedic Surgery

## 2018-11-27 DIAGNOSIS — Z7984 Long term (current) use of oral hypoglycemic drugs: Secondary | ICD-10-CM | POA: Insufficient documentation

## 2018-11-27 DIAGNOSIS — M069 Rheumatoid arthritis, unspecified: Secondary | ICD-10-CM | POA: Diagnosis not present

## 2018-11-27 DIAGNOSIS — J302 Other seasonal allergic rhinitis: Secondary | ICD-10-CM | POA: Diagnosis not present

## 2018-11-27 DIAGNOSIS — Z8542 Personal history of malignant neoplasm of other parts of uterus: Secondary | ICD-10-CM | POA: Diagnosis not present

## 2018-11-27 DIAGNOSIS — Z8507 Personal history of malignant neoplasm of pancreas: Secondary | ICD-10-CM | POA: Diagnosis not present

## 2018-11-27 DIAGNOSIS — Z6837 Body mass index (BMI) 37.0-37.9, adult: Secondary | ICD-10-CM | POA: Diagnosis not present

## 2018-11-27 DIAGNOSIS — Z7952 Long term (current) use of systemic steroids: Secondary | ICD-10-CM | POA: Diagnosis not present

## 2018-11-27 DIAGNOSIS — D649 Anemia, unspecified: Secondary | ICD-10-CM | POA: Diagnosis not present

## 2018-11-27 DIAGNOSIS — K219 Gastro-esophageal reflux disease without esophagitis: Secondary | ICD-10-CM | POA: Insufficient documentation

## 2018-11-27 DIAGNOSIS — Z79899 Other long term (current) drug therapy: Secondary | ICD-10-CM | POA: Insufficient documentation

## 2018-11-27 DIAGNOSIS — Z85038 Personal history of other malignant neoplasm of large intestine: Secondary | ICD-10-CM | POA: Insufficient documentation

## 2018-11-27 DIAGNOSIS — F329 Major depressive disorder, single episode, unspecified: Secondary | ICD-10-CM | POA: Insufficient documentation

## 2018-11-27 DIAGNOSIS — E119 Type 2 diabetes mellitus without complications: Secondary | ICD-10-CM | POA: Insufficient documentation

## 2018-11-27 DIAGNOSIS — G620 Drug-induced polyneuropathy: Secondary | ICD-10-CM | POA: Diagnosis not present

## 2018-11-27 DIAGNOSIS — M1712 Unilateral primary osteoarthritis, left knee: Principal | ICD-10-CM | POA: Insufficient documentation

## 2018-11-27 DIAGNOSIS — T451X5A Adverse effect of antineoplastic and immunosuppressive drugs, initial encounter: Secondary | ICD-10-CM | POA: Diagnosis not present

## 2018-11-27 DIAGNOSIS — Z86711 Personal history of pulmonary embolism: Secondary | ICD-10-CM | POA: Insufficient documentation

## 2018-11-27 DIAGNOSIS — C541 Malignant neoplasm of endometrium: Secondary | ICD-10-CM | POA: Diagnosis not present

## 2018-11-27 DIAGNOSIS — Z96652 Presence of left artificial knee joint: Secondary | ICD-10-CM

## 2018-11-27 DIAGNOSIS — C7961 Secondary malignant neoplasm of right ovary: Secondary | ICD-10-CM | POA: Diagnosis not present

## 2018-11-27 DIAGNOSIS — G8918 Other acute postprocedural pain: Secondary | ICD-10-CM | POA: Diagnosis not present

## 2018-11-27 HISTORY — PX: PARTIAL KNEE ARTHROPLASTY: SHX2174

## 2018-11-27 LAB — COMPREHENSIVE METABOLIC PANEL
ALT: 27 U/L (ref 0–44)
AST: 19 U/L (ref 15–41)
Albumin: 3.2 g/dL — ABNORMAL LOW (ref 3.5–5.0)
Alkaline Phosphatase: 67 U/L (ref 38–126)
Anion gap: 7 (ref 5–15)
BUN: 13 mg/dL (ref 6–20)
CO2: 25 mmol/L (ref 22–32)
Calcium: 8.6 mg/dL — ABNORMAL LOW (ref 8.9–10.3)
Chloride: 107 mmol/L (ref 98–111)
Creatinine, Ser: 0.61 mg/dL (ref 0.44–1.00)
GFR calc Af Amer: >60 mL/min (ref >60)
GFR calc non Af Amer: >60 mL/min (ref >60)
Glucose, Bld: 149 mg/dL — ABNORMAL HIGH (ref 70–99)
Potassium: 4.4 mmol/L (ref 3.5–5.1)
Sodium: 139 mmol/L (ref 135–145)
Total Bilirubin: 0.4 mg/dL (ref 0.3–1.2)
Total Protein: 6.9 g/dL (ref 6.5–8.1)

## 2018-11-27 LAB — GLUCOSE, CAPILLARY
Glucose-Capillary: 88 mg/dL (ref 70–99)
Glucose-Capillary: 147 mg/dL — ABNORMAL HIGH (ref 70–99)

## 2018-11-27 LAB — CBC WITH DIFFERENTIAL/PLATELET
Abs Immature Granulocytes: 0.08 10*3/uL — ABNORMAL HIGH (ref 0.00–0.07)
Basophils Absolute: 0 10*3/uL (ref 0.0–0.1)
Basophils Relative: 0 %
Eosinophils Absolute: 0 10*3/uL (ref 0.0–0.5)
Eosinophils Relative: 0 %
HCT: 33.6 % — ABNORMAL LOW (ref 36.0–46.0)
Hemoglobin: 10 g/dL — ABNORMAL LOW (ref 12.0–15.0)
Immature Granulocytes: 1 %
Lymphocytes Relative: 4 %
Lymphs Abs: 0.6 10*3/uL — ABNORMAL LOW (ref 0.7–4.0)
MCH: 27 pg (ref 26.0–34.0)
MCHC: 29.8 g/dL — ABNORMAL LOW (ref 30.0–36.0)
MCV: 90.6 fL (ref 80.0–100.0)
Monocytes Absolute: 0.1 10*3/uL (ref 0.1–1.0)
Monocytes Relative: 1 %
Neutro Abs: 12.1 10*3/uL — ABNORMAL HIGH (ref 1.7–7.7)
Neutrophils Relative %: 94 %
Platelets: 658 10*3/uL — ABNORMAL HIGH (ref 150–400)
RBC: 3.71 MIL/uL — ABNORMAL LOW (ref 3.87–5.11)
RDW: 21.3 % — ABNORMAL HIGH (ref 11.5–15.5)
WBC: 12.9 10*3/uL — ABNORMAL HIGH (ref 4.0–10.5)
nRBC: 0 % (ref 0.0–0.2)

## 2018-11-27 SURGERY — ARTHROPLASTY, KNEE, UNICOMPARTMENTAL
Anesthesia: Regional | Site: Knee | Laterality: Left

## 2018-11-27 MED ORDER — METFORMIN HCL 500 MG PO TABS
500.0000 mg | ORAL_TABLET | Freq: Two times a day (BID) | ORAL | Status: DC
  Administered 2018-11-27 – 2018-11-28 (×2): 500 mg via ORAL
  Filled 2018-11-27 (×2): qty 1

## 2018-11-27 MED ORDER — BUPIVACAINE-EPINEPHRINE 0.25% -1:200000 IJ SOLN
INTRAMUSCULAR | Status: DC | PRN
  Administered 2018-11-27: 30 mL

## 2018-11-27 MED ORDER — DEXAMETHASONE SODIUM PHOSPHATE 10 MG/ML IJ SOLN
10.0000 mg | Freq: Once | INTRAMUSCULAR | Status: AC
  Administered 2018-11-28: 10 mg via INTRAVENOUS
  Filled 2018-11-27: qty 1

## 2018-11-27 MED ORDER — GABAPENTIN 300 MG PO CAPS
300.0000 mg | ORAL_CAPSULE | Freq: Three times a day (TID) | ORAL | Status: DC
  Administered 2018-11-27 – 2018-11-28 (×3): 300 mg via ORAL
  Filled 2018-11-27 (×3): qty 1

## 2018-11-27 MED ORDER — METHOCARBAMOL 500 MG PO TABS
500.0000 mg | ORAL_TABLET | Freq: Four times a day (QID) | ORAL | Status: DC | PRN
  Administered 2018-11-27 – 2018-11-28 (×2): 500 mg via ORAL
  Filled 2018-11-27 (×2): qty 1

## 2018-11-27 MED ORDER — SENNOSIDES-DOCUSATE SODIUM 8.6-50 MG PO TABS: 1.0000 | ORAL_TABLET | Freq: Every evening | ORAL | Status: DC | PRN

## 2018-11-27 MED ORDER — LACTATED RINGERS IV SOLN
INTRAVENOUS | Status: DC
  Administered 2018-11-27 (×2): via INTRAVENOUS

## 2018-11-27 MED ORDER — DEXAMETHASONE SODIUM PHOSPHATE 10 MG/ML IJ SOLN
INTRAMUSCULAR | Status: AC
  Filled 2018-11-27: qty 1

## 2018-11-27 MED ORDER — SODIUM CHLORIDE 0.9 % IV SOLN
INTRAVENOUS | Status: DC | PRN
  Administered 2018-11-27: 20 ug/min via INTRAVENOUS

## 2018-11-27 MED ORDER — HYDROMORPHONE HCL 1 MG/ML IJ SOLN
INTRAMUSCULAR | Status: AC
  Filled 2018-11-27: qty 1

## 2018-11-27 MED ORDER — METOCLOPRAMIDE HCL 5 MG PO TABS: 5.0000 mg | ORAL_TABLET | Freq: Three times a day (TID) | ORAL | Status: DC | PRN

## 2018-11-27 MED ORDER — TRANEXAMIC ACID-NACL 1000-0.7 MG/100ML-% IV SOLN
1000.0000 mg | INTRAVENOUS | Status: AC
  Administered 2018-11-27: 1000 mg via INTRAVENOUS
  Filled 2018-11-27: qty 100

## 2018-11-27 MED ORDER — PROPOFOL 500 MG/50ML IV EMUL
INTRAVENOUS | Status: DC | PRN
  Administered 2018-11-27: 75 ug/kg/min via INTRAVENOUS

## 2018-11-27 MED ORDER — DOCUSATE SODIUM 100 MG PO CAPS
100.0000 mg | ORAL_CAPSULE | Freq: Two times a day (BID) | ORAL | Status: DC
  Administered 2018-11-27 – 2018-11-28 (×2): 100 mg via ORAL
  Filled 2018-11-27 (×2): qty 1

## 2018-11-27 MED ORDER — ONDANSETRON HCL 4 MG/2ML IJ SOLN
INTRAMUSCULAR | Status: AC
  Filled 2018-11-27: qty 2

## 2018-11-27 MED ORDER — SODIUM CHLORIDE 0.9% FLUSH
INTRAVENOUS | Status: DC | PRN
  Administered 2018-11-27: 20 mL

## 2018-11-27 MED ORDER — DEXAMETHASONE SODIUM PHOSPHATE 10 MG/ML IJ SOLN
INTRAMUSCULAR | Status: DC | PRN
  Administered 2018-11-27: 10 mg

## 2018-11-27 MED ORDER — CEFAZOLIN SODIUM-DEXTROSE 2-4 GM/100ML-% IV SOLN
2.0000 g | INTRAVENOUS | Status: AC
  Administered 2018-11-27: 2 g via INTRAVENOUS
  Filled 2018-11-27: qty 100

## 2018-11-27 MED ORDER — ACETAMINOPHEN 500 MG PO TABS
1000.0000 mg | ORAL_TABLET | Freq: Four times a day (QID) | ORAL | Status: AC
  Administered 2018-11-27 – 2018-11-28 (×4): 1000 mg via ORAL
  Filled 2018-11-27 (×4): qty 2

## 2018-11-27 MED ORDER — OXYCODONE HCL 5 MG/5ML PO SOLN: 5.0000 mg | Freq: Once | ORAL | Status: DC | PRN

## 2018-11-27 MED ORDER — MIDAZOLAM HCL 2 MG/2ML IJ SOLN
1.0000 mg | INTRAMUSCULAR | Status: DC
  Administered 2018-11-27: 2 mg via INTRAVENOUS
  Filled 2018-11-27: qty 2

## 2018-11-27 MED ORDER — BUPIVACAINE LIPOSOME 1.3 % IJ SUSP
INTRAMUSCULAR | Status: DC | PRN
  Administered 2018-11-27: 20 mL

## 2018-11-27 MED ORDER — CHLORHEXIDINE GLUCONATE 4 % EX LIQD: 60.0000 mL | Freq: Once | CUTANEOUS | Status: DC

## 2018-11-27 MED ORDER — OXYCODONE HCL 5 MG PO TABS
5.0000 mg | ORAL_TABLET | ORAL | Status: DC | PRN
  Administered 2018-11-27 – 2018-11-28 (×4): 10 mg via ORAL
  Filled 2018-11-27 (×4): qty 2

## 2018-11-27 MED ORDER — TRANEXAMIC ACID-NACL 1000-0.7 MG/100ML-% IV SOLN
1000.0000 mg | Freq: Once | INTRAVENOUS | Status: AC
  Administered 2018-11-27: 1000 mg via INTRAVENOUS
  Filled 2018-11-27: qty 100

## 2018-11-27 MED ORDER — MEPERIDINE HCL 50 MG/ML IJ SOLN: 6.2500 mg | INTRAMUSCULAR | Status: DC | PRN

## 2018-11-27 MED ORDER — FLEET ENEMA 7-19 GM/118ML RE ENEM: 1.0000 | ENEMA | Freq: Once | RECTAL | Status: DC | PRN

## 2018-11-27 MED ORDER — KETOROLAC TROMETHAMINE 30 MG/ML IJ SOLN: 30.0000 mg | Freq: Once | INTRAMUSCULAR | Status: DC | PRN

## 2018-11-27 MED ORDER — PANTOPRAZOLE SODIUM 40 MG PO TBEC
40.0000 mg | DELAYED_RELEASE_TABLET | Freq: Every day | ORAL | Status: DC
  Administered 2018-11-28: 40 mg via ORAL
  Filled 2018-11-27: qty 1

## 2018-11-27 MED ORDER — METHOCARBAMOL 500 MG IVPB - SIMPLE MED
INTRAVENOUS | Status: AC
  Filled 2018-11-27: qty 50

## 2018-11-27 MED ORDER — ONDANSETRON HCL 4 MG PO TABS: 4.0000 mg | ORAL_TABLET | Freq: Four times a day (QID) | ORAL | Status: DC | PRN

## 2018-11-27 MED ORDER — FERROUS SULFATE 325 (65 FE) MG PO TABS
325.0000 mg | ORAL_TABLET | Freq: Three times a day (TID) | ORAL | Status: DC
  Administered 2018-11-27 – 2018-11-28 (×2): 325 mg via ORAL
  Filled 2018-11-27 (×2): qty 1

## 2018-11-27 MED ORDER — PHENYLEPHRINE HCL (PRESSORS) 10 MG/ML IV SOLN
INTRAVENOUS | Status: AC
  Filled 2018-11-27: qty 1

## 2018-11-27 MED ORDER — TRAMADOL HCL 50 MG PO TABS
50.0000 mg | ORAL_TABLET | Freq: Four times a day (QID) | ORAL | Status: DC
  Administered 2018-11-27 – 2018-11-28 (×4): 50 mg via ORAL
  Filled 2018-11-27 (×4): qty 1

## 2018-11-27 MED ORDER — SCOPOLAMINE 1 MG/3DAYS TD PT72
1.0000 | MEDICATED_PATCH | TRANSDERMAL | Status: DC
  Administered 2018-11-27: 1.5 mg via TRANSDERMAL
  Filled 2018-11-27 (×2): qty 1

## 2018-11-27 MED ORDER — PHENOL 1.4 % MT LIQD: 1.0000 | OROMUCOSAL | Status: DC | PRN

## 2018-11-27 MED ORDER — METHOCARBAMOL 500 MG IVPB - SIMPLE MED
500.0000 mg | Freq: Four times a day (QID) | INTRAVENOUS | Status: DC | PRN
  Administered 2018-11-27: 500 mg via INTRAVENOUS
  Filled 2018-11-27: qty 50

## 2018-11-27 MED ORDER — LIDOCAINE 2% (20 MG/ML) 5 ML SYRINGE
INTRAMUSCULAR | Status: DC | PRN
  Administered 2018-11-27: 40 mg via INTRAVENOUS

## 2018-11-27 MED ORDER — FENTANYL CITRATE (PF) 100 MCG/2ML IJ SOLN
50.0000 ug | INTRAMUSCULAR | Status: DC
  Administered 2018-11-27: 100 ug via INTRAVENOUS
  Filled 2018-11-27 (×2): qty 2

## 2018-11-27 MED ORDER — PROPOFOL 500 MG/50ML IV EMUL
INTRAVENOUS | Status: AC
  Filled 2018-11-27: qty 50

## 2018-11-27 MED ORDER — SODIUM CHLORIDE 0.9 % IV SOLN
INTRAVENOUS | Status: DC
  Administered 2018-11-27: 17:00:00 via INTRAVENOUS

## 2018-11-27 MED ORDER — GABAPENTIN 300 MG PO CAPS
300.0000 mg | ORAL_CAPSULE | Freq: Once | ORAL | Status: DC
  Filled 2018-11-27: qty 1

## 2018-11-27 MED ORDER — BUPIVACAINE HCL (PF) 0.25 % IJ SOLN
INTRAMUSCULAR | Status: AC
  Filled 2018-11-27: qty 30

## 2018-11-27 MED ORDER — ASPIRIN EC 325 MG PO TBEC
325.0000 mg | DELAYED_RELEASE_TABLET | Freq: Two times a day (BID) | ORAL | Status: DC
  Administered 2018-11-28: 325 mg via ORAL
  Filled 2018-11-27: qty 1

## 2018-11-27 MED ORDER — FENTANYL CITRATE (PF) 100 MCG/2ML IJ SOLN
INTRAMUSCULAR | Status: AC
  Filled 2018-11-27: qty 2

## 2018-11-27 MED ORDER — ONDANSETRON HCL 4 MG/2ML IJ SOLN
INTRAMUSCULAR | Status: DC | PRN
  Administered 2018-11-27: 4 mg via INTRAVENOUS

## 2018-11-27 MED ORDER — ZOLPIDEM TARTRATE 5 MG PO TABS: 5.0000 mg | ORAL_TABLET | Freq: Every evening | ORAL | Status: DC | PRN

## 2018-11-27 MED ORDER — PROMETHAZINE HCL 25 MG/ML IJ SOLN: 6.2500 mg | INTRAMUSCULAR | Status: DC | PRN

## 2018-11-27 MED ORDER — BISACODYL 5 MG PO TBEC: 5.0000 mg | DELAYED_RELEASE_TABLET | Freq: Every day | ORAL | Status: DC | PRN

## 2018-11-27 MED ORDER — HYDROMORPHONE HCL 1 MG/ML IJ SOLN
0.5000 mg | INTRAMUSCULAR | Status: DC | PRN
  Administered 2018-11-27: 1 mg via INTRAVENOUS
  Filled 2018-11-27: qty 1

## 2018-11-27 MED ORDER — CELECOXIB 200 MG PO CAPS: 400.0000 mg | ORAL_CAPSULE | Freq: Once | ORAL | Status: DC

## 2018-11-27 MED ORDER — METOCLOPRAMIDE HCL 5 MG/ML IJ SOLN: 5.0000 mg | Freq: Three times a day (TID) | INTRAMUSCULAR | Status: DC | PRN

## 2018-11-27 MED ORDER — DIPHENHYDRAMINE HCL 12.5 MG/5ML PO ELIX: 12.5000 mg | ORAL_SOLUTION | ORAL | Status: DC | PRN

## 2018-11-27 MED ORDER — CITALOPRAM HYDROBROMIDE 20 MG PO TABS
20.0000 mg | ORAL_TABLET | Freq: Every day | ORAL | Status: DC
  Administered 2018-11-28: 20 mg via ORAL
  Filled 2018-11-27: qty 1

## 2018-11-27 MED ORDER — CEFAZOLIN SODIUM-DEXTROSE 2-4 GM/100ML-% IV SOLN
2.0000 g | Freq: Four times a day (QID) | INTRAVENOUS | Status: AC
  Administered 2018-11-27 (×2): 2 g via INTRAVENOUS
  Filled 2018-11-27 (×2): qty 100

## 2018-11-27 MED ORDER — ACETAMINOPHEN 500 MG PO TABS
1000.0000 mg | ORAL_TABLET | Freq: Once | ORAL | Status: AC
  Administered 2018-11-27: 1000 mg via ORAL
  Filled 2018-11-27: qty 2

## 2018-11-27 MED ORDER — BUPIVACAINE IN DEXTROSE 0.75-8.25 % IT SOLN
INTRATHECAL | Status: DC | PRN
  Administered 2018-11-27: 1.6 mL via INTRATHECAL

## 2018-11-27 MED ORDER — PREDNISONE 5 MG PO TABS
5.0000 mg | ORAL_TABLET | Freq: Every day | ORAL | Status: DC
  Administered 2018-11-28: 5 mg via ORAL
  Filled 2018-11-27: qty 1

## 2018-11-27 MED ORDER — PANCRELIPASE (LIP-PROT-AMYL) 12000-38000 UNITS PO CPEP
12000.0000 [IU] | ORAL_CAPSULE | Freq: Three times a day (TID) | ORAL | Status: DC
  Administered 2018-11-27 – 2018-11-28 (×2): 12000 [IU] via ORAL

## 2018-11-27 MED ORDER — SODIUM CHLORIDE 0.9 % IR SOLN
Status: DC | PRN
  Administered 2018-11-27 (×2): 1000 mL

## 2018-11-27 MED ORDER — DEXAMETHASONE SODIUM PHOSPHATE 10 MG/ML IJ SOLN: INTRAMUSCULAR | Status: DC | PRN

## 2018-11-27 MED ORDER — ALUM & MAG HYDROXIDE-SIMETH 200-200-20 MG/5ML PO SUSP: 30.0000 mL | ORAL | Status: DC | PRN

## 2018-11-27 MED ORDER — PANCRELIPASE (LIP-PROT-AMYL) 12000-38000 UNITS PO CPEP
12000.0000 [IU] | ORAL_CAPSULE | Freq: Three times a day (TID) | ORAL | Status: DC
  Administered 2018-11-27 – 2018-11-28 (×3): 12000 [IU] via ORAL
  Filled 2018-11-27 (×4): qty 1

## 2018-11-27 MED ORDER — PROPOFOL 10 MG/ML IV BOLUS
INTRAVENOUS | Status: AC
  Filled 2018-11-27: qty 40

## 2018-11-27 MED ORDER — LIDOCAINE 2% (20 MG/ML) 5 ML SYRINGE
INTRAMUSCULAR | Status: AC
  Filled 2018-11-27: qty 5

## 2018-11-27 MED ORDER — MENTHOL 3 MG MT LOZG: 1.0000 | LOZENGE | OROMUCOSAL | Status: DC | PRN

## 2018-11-27 MED ORDER — OXYCODONE HCL 5 MG PO TABS: 5.0000 mg | ORAL_TABLET | Freq: Once | ORAL | Status: DC | PRN

## 2018-11-27 MED ORDER — HYDROMORPHONE HCL 1 MG/ML IJ SOLN
0.2500 mg | INTRAMUSCULAR | Status: DC | PRN
  Administered 2018-11-27 (×2): 0.5 mg via INTRAVENOUS

## 2018-11-27 MED ORDER — DEXAMETHASONE SODIUM PHOSPHATE 10 MG/ML IJ SOLN
8.0000 mg | Freq: Once | INTRAMUSCULAR | Status: AC
  Administered 2018-11-27: 11:00:00 8 mg via INTRAVENOUS

## 2018-11-27 MED ORDER — BUPIVACAINE LIPOSOME 1.3 % IJ SUSP
20.0000 mL | Freq: Once | INTRAMUSCULAR | Status: DC
  Filled 2018-11-27: qty 20

## 2018-11-27 MED ORDER — MIDAZOLAM HCL 2 MG/2ML IJ SOLN
INTRAMUSCULAR | Status: AC
  Filled 2018-11-27: qty 2

## 2018-11-27 MED ORDER — PROPOFOL 10 MG/ML IV BOLUS
INTRAVENOUS | Status: DC | PRN
  Administered 2018-11-27 (×2): 20 mg via INTRAVENOUS

## 2018-11-27 MED ORDER — ONDANSETRON HCL 4 MG/2ML IJ SOLN: 4.0000 mg | Freq: Four times a day (QID) | INTRAMUSCULAR | Status: DC | PRN

## 2018-11-27 MED ORDER — SODIUM CHLORIDE (PF) 0.9 % IJ SOLN
INTRAMUSCULAR | Status: AC
  Filled 2018-11-27: qty 20

## 2018-11-27 MED ORDER — PROPOFOL 10 MG/ML IV BOLUS
INTRAVENOUS | Status: AC
  Filled 2018-11-27: qty 20

## 2018-11-27 MED ORDER — MIDAZOLAM HCL 2 MG/2ML IJ SOLN
INTRAMUSCULAR | Status: DC | PRN
  Administered 2018-11-27 (×2): 1 mg via INTRAVENOUS

## 2018-11-27 MED ORDER — ROPIVACAINE HCL 5 MG/ML IJ SOLN
INTRAMUSCULAR | Status: DC | PRN
  Administered 2018-11-27: 20 mL via PERINEURAL

## 2018-11-27 MED ORDER — GABAPENTIN 300 MG PO CAPS: 300.0000 mg | ORAL_CAPSULE | Freq: Two times a day (BID) | ORAL | Status: DC

## 2018-11-27 MED ORDER — POVIDONE-IODINE 10 % EX SWAB: 2.0000 "application " | Freq: Once | CUTANEOUS | Status: DC

## 2018-11-27 SURGICAL SUPPLY — 52 items
BAG ZIPLOCK 12X15 (MISCELLANEOUS) ×2 IMPLANT
BEARING TIBIAL UKA SZ2 10MM (Knees) IMPLANT
BLADE SAW RECIPROCATING 77.5 (BLADE) ×2 IMPLANT
BLADE SAW SAG 90X13X1.27 (BLADE) ×2 IMPLANT
BNDG COHESIVE 6X5 TAN STRL LF (GAUZE/BANDAGES/DRESSINGS) ×1 IMPLANT
BNDG ELASTIC 4X5.8 VLCR STR LF (GAUZE/BANDAGES/DRESSINGS) ×1 IMPLANT
BNDG ELASTIC 6X5.8 VLCR STR LF (GAUZE/BANDAGES/DRESSINGS) ×2 IMPLANT
BOWL SMART MIX CTS (DISPOSABLE) ×2 IMPLANT
CEMENT BONE SIMPLEX SPEEDSET (Cement) ×2 IMPLANT
COMP FEMUR SZ2 IBAL LT MED (Knees) ×2 IMPLANT
COMPONENT FEMUR SZ2 IBALLT MED (Knees) IMPLANT
COVER SURGICAL LIGHT HANDLE (MISCELLANEOUS) ×2 IMPLANT
COVER WAND RF STERILE (DRAPES) IMPLANT
CUFF TOURN SGL QUICK 34 (TOURNIQUET CUFF) ×1
CUFF TRNQT CYL 34X4.125X (TOURNIQUET CUFF) ×1 IMPLANT
DECANTER SPIKE VIAL GLASS SM (MISCELLANEOUS) ×3 IMPLANT
DRAPE INCISE IOBAN 66X45 STRL (DRAPES) ×4 IMPLANT
DRESSING AQUACEL AG SP 3.5X10 (GAUZE/BANDAGES/DRESSINGS) IMPLANT
DRSG AQUACEL AG ADV 3.5X 6 (GAUZE/BANDAGES/DRESSINGS) ×2 IMPLANT
DRSG AQUACEL AG SP 3.5X10 (GAUZE/BANDAGES/DRESSINGS) ×2
DURAPREP 26ML APPLICATOR (WOUND CARE) ×3 IMPLANT
ELECT REM PT RETURN 15FT ADLT (MISCELLANEOUS) ×2 IMPLANT
GLOVE BIOGEL M STRL SZ7.5 (GLOVE) ×2 IMPLANT
GLOVE BIOGEL PI IND STRL 7.5 (GLOVE) ×1 IMPLANT
GLOVE BIOGEL PI IND STRL 8.5 (GLOVE) ×2 IMPLANT
GLOVE BIOGEL PI INDICATOR 7.5 (GLOVE) ×1
GLOVE BIOGEL PI INDICATOR 8.5 (GLOVE) ×2
GLOVE SURG ORTHO 8.0 STRL STRW (GLOVE) ×6 IMPLANT
GOWN STRL REUS W/TWL 2XL LVL3 (GOWN DISPOSABLE) ×2 IMPLANT
GOWN STRL REUS W/TWL XL LVL3 (GOWN DISPOSABLE) ×4 IMPLANT
HANDPIECE INTERPULSE COAX TIP (DISPOSABLE) ×1
HOLDER FOLEY CATH W/STRAP (MISCELLANEOUS) ×1 IMPLANT
HOOD PEEL AWAY FLYTE STAYCOOL (MISCELLANEOUS) ×6 IMPLANT
KIT TURNOVER KIT A (KITS) IMPLANT
MANIFOLD NEPTUNE II (INSTRUMENTS) ×2 IMPLANT
NS IRRIG 1000ML POUR BTL (IV SOLUTION) ×2 IMPLANT
PACK TOTAL KNEE CUSTOM (KITS) ×2 IMPLANT
SET HNDPC FAN SPRY TIP SCT (DISPOSABLE) ×1 IMPLANT
STRIP CLOSURE SKIN 1/2X4 (GAUZE/BANDAGES/DRESSINGS) ×2 IMPLANT
SUT MNCRL AB 4-0 PS2 18 (SUTURE) ×2 IMPLANT
SUT STRATAFIX 0 PDS 27 VIOLET (SUTURE) ×4
SUT STRATAFIX PDS+ 0 24IN (SUTURE) ×2 IMPLANT
SUT VIC AB 1 CT1 36 (SUTURE) ×2 IMPLANT
SUTURE STRATFX 0 PDS 27 VIOLET (SUTURE) ×2 IMPLANT
TAPE STRIPS DRAPE STRL (GAUZE/BANDAGES/DRESSINGS) ×1 IMPLANT
TIBIAL BEARING UKA SZ2 10MM (Knees) ×2 IMPLANT
TRAY FOLEY MTR SLVR 14FR STAT (SET/KITS/TRAYS/PACK) ×1 IMPLANT
TRAY FOLEY MTR SLVR 16FR STAT (SET/KITS/TRAYS/PACK) ×1 IMPLANT
TRAY TIB CMNT UKA SZ 2 KNEE (Miscellaneous) ×1 IMPLANT
WATER STERILE IRR 1000ML POUR (IV SOLUTION) ×3 IMPLANT
WRAP KNEE MAXI GEL POST OP (GAUZE/BANDAGES/DRESSINGS) ×1 IMPLANT
YANKAUER SUCT BULB TIP 10FT TU (MISCELLANEOUS) ×2 IMPLANT

## 2018-11-27 NOTE — Anesthesia Procedure Notes (Signed)
Procedure Name: MAC Date/Time: 11/27/2018 11:00 AM Performed by: Eben Burow, CRNA Pre-anesthesia Checklist: Patient identified, Emergency Drugs available, Suction available, Patient being monitored and Timeout performed Oxygen Delivery Method: Simple face mask Dental Injury: Teeth and Oropharynx as per pre-operative assessment

## 2018-11-27 NOTE — H&P (Signed)
KENIJAH BENNINGFIELD MRN:  [id number] DOB/SEX:  Oct 09, 1975/female  CHIEF COMPLAINT:  Painful left Knee  HISTORY: Patient is a 43 y.o. female presented with a history of pain in the left knee. Onset of symptoms was gradual starting a few years ago with gradually worsening course since that time. Patient has been treated conservatively with over-the-counter NSAIDs and activity modification. Patient currently rates pain in the knee at 10 out of 10 with activity. There is pain at night.  PAST MEDICAL HISTORY: Patient Active Problem List   Diagnosis Date Noted  . UTI (urinary tract infection) 06/29/2018  . Pulmonary embolism ([place]) 04/10/2018  . Diarrhea due to drug 03/28/2018  . Steroid-induced diabetes ([place]) 03/14/2018  . Neutropenic fever ([place]) 02/14/2018  . Acute rhinitis 02/14/2018  . Tachycardia 02/14/2018  . Physical debility 02/07/2018  . Hot flashes due to surgical menopause 02/06/2018  . Anemia due to antineoplastic chemotherapy 12/15/2017  . Hypomagnesemia 12/15/2017  . Iron deficiency anemia 12/15/2017  . S/P splenectomy 12/15/2017  . Lynch syndrome 11/22/2017  . Anterior leg pain, right 11/21/2017  . Genetic testing 11/18/2017  . Inflammatory arthritis 11/08/2017  . Dysuria 11/02/2017  . Peripheral neuropathy due to chemotherapy ([place]) 10/22/2017  . Bone pain 10/21/2017  . Family history of colon cancer   . Pancreatic cancer ([place]) 10/17/2017  . Family history of uterine cancer 10/17/2017  . Sinusitis, chronic 09/15/2017  . Malabsorption due to disorder of pancreas 09/15/2017  . Endometrial cancer ([place]) 08/31/2017  . Secondary malignant neoplasm of right ovary ([place]) 08/31/2017  . Pustular folliculitis 26/94/8546  . MRSA colonization 05/12/2017  . Left foot pain 01/10/2017  . Osteoarthritis of both knees 12/01/2016  . Class 3 severe obesity due to excess calories without serious comorbidity with body mass index (BMI) of 40.0 to 44.9 in adult ([place]) 10/01/2016  . High risk  medication use 02/06/2016  . Family history of rheumatoid arthritis 02/06/2016  . H/O seasonal allergies 02/06/2016  . Rheumatoid arthritis of multiple sites with negative rheumatoid factor ([place]) 02/03/2016  . History of colon cancer 02/03/2016  . HLA B27 (HLA B27 positive) 02/03/2016   Past Medical History:  Diagnosis Date  . Allergic rhinitis, seasonal   . Cecal cancer ([place]) 2001   Stage II (T3N0)  04-11-2001cecum-partial colectomy and completed chemo 2002  . Depression   . Diabetes mellitus without complication ([place])   . Endometrial cancer ([place]) 08/2017   Stage IA, Grade 1  . GERD (gastroesophageal reflux disease)   . History of MRSA infection 01/2017   followed by infectious disease center--  recurrent pustular folliculitis  . Nephrolithiasis    bilateral nonobstructive calculi per CT 08-18-2017  . OA (osteoarthritis)   . Ovarian cancer, right ([place]) 08/2017   Stage II Grade 2 Endometrioid  . Pancreatic cancer ([place])   . Rheumatoid arthritis [facility])    rheumatologist-  dr devenswar-- treated w/ oral prednisone daily and methotrexate injection every 3 wks   Past Surgical History:  Procedure Laterality Date  . BREAST LUMPECTOMY WITH RADIOACTIVE SEED LOCALIZATION Left 06/28/2014   Benign Procedure: RADIOACTIVE SEED LOCALIZATION LEFT BREAST LUMPECTOMY;  Surgeon: Excell Seltzer, MD;  Location: [place];  Service: General;  Laterality: Left;  . COLONOSCOPY  06/19/14  . EYE SURGERY Left    plug in tear duct  . IR IMAGING GUIDED PORT INSERTION  09/26/2017  . KNEE ARTHROSCOPY    . PANCREATECTOMY  11/25/2017  . PORT A CATH REVISION  2001   in and out  .  RIGHT COLECTOMY  06/02/2009   cecum cancer  . ROBOTIC ASSISTED TOTAL HYSTERECTOMY WITH BILATERAL SALPINGO OOPHERECTOMY Bilateral 08/30/2017   Procedure: XI ROBOTIC ASSISTED TOTAL  HYSTERECTOMY BILATERAL  SALPINGO OOPHORECTOMY; LYSIS OF ADHESIONS;  Surgeon: Isabel Caprice, MD;  [facility];  Service: Gynecology;   Laterality: Bilateral;  . SPLENECTOMY, PARTIAL  11/25/2017     MEDICATIONS:   Medications Prior to Admission  Medication Sig Dispense Refill Last Dose  . acetaminophen (TYLENOL) 650 MG CR tablet Take 1,300 mg by mouth every 8 (eight) hours as needed for pain.    11/26/2018 at Unknown time  . citalopram (CELEXA) 20 MG tablet TAKE 1 TABLET BY MOUTH EVERY DAY (Patient taking differently: Take 20 mg by mouth daily. ) 90 tablet 4 11/27/2018 at 0630  . diclofenac sodium (VOLTAREN) 1 % GEL APPLY TO AFFECTED 3 LARGE JOINTS UP TO 3 TIMES DAILY AS NEEDED AS DIRECTED (Patient taking differently: Apply 2 g topically 3 (three) times daily as needed. APPLY TO AFFECTED 3 LARGE JOINTS UP TO 3 TIMES DAILY AS NEEDED AS DIRECTED) 300 g 1 Past Month at Unknown time  . folic acid (FOLVITE) 1 MG tablet Take 2 tablets (2 mg total) by mouth daily. 180 tablet 3 11/26/2018 at Unknown time  . gabapentin (NEURONTIN) 300 MG capsule TAKE 1 CAPSULE BY MOUTH THREE TIMES A DAY (Patient taking differently: Take 300 mg by mouth 2 (two) times daily. ) 270 capsule 11 11/27/2018 at 0630  . lidocaine-prilocaine (EMLA) cream Apply to affected area once (Patient taking differently: Apply 1 application topically daily as needed (port). Apply to affected area once) 30 g 3   . lipase/protease/amylase (CREON) 12000 units CPEP capsule Take 12,000 Units by mouth See admin instructions. Take 1 capsule before and after each meal. Take 1 capsule before each snack.   11/26/2018 at Unknown time  . loratadine (CLARITIN) 10 MG tablet Take 10 mg by mouth daily after breakfast.   11/26/2018 at Unknown time  . metFORMIN (GLUCOPHAGE) 500 MG tablet TAKE 1 TABLET (500 MG TOTAL) BY MOUTH 2 (TWO) TIMES DAILY WITH A MEAL. (Patient taking differently: Take 500 mg by mouth 2 (two) times daily with a meal. ) 60 tablet 1 11/26/2018 at Unknown time  . methocarbamol (ROBAXIN) 500 MG tablet TAKE 1 TABLET BY MOUTH 3 TIMES DAILY AS NEEDED FOR MUSCLE SPASMS. (Patient taking  differently: Take 500 mg by mouth every 8 (eight) hours as needed for muscle spasms. ) 90 tablet 0 11/26/2018 at Unknown time  . Methotrexate, PF, 20 MG/0.4ML SOAJ Inject 20 mg into the skin once a week. 12 pen 0 Past Month at Unknown time  . Multiple Vitamin (MULTIVITAMIN WITH MINERALS) TABS tablet Take 1 tablet by mouth daily.   11/26/2018 at Unknown time  . naproxen sodium (ALEVE) 220 MG tablet Take 220 mg by mouth daily as needed (pain).    Past Week at Unknown time  . omeprazole (PRILOSEC) 20 MG capsule Take 20 mg by mouth daily as needed (heartburn).    11/27/2018 at 0630  . ORENCIA CLICKJECT 169 MG/ML SOAJ INJECT ONE CLICKJECT PEN SUBCUTANEOUSLY EVERY WEEK. REFRIGERATE. ALLOWTO WARM TO ROOM TEMPERATURE PRIOR TO ADMINISTRATION. (Patient taking differently: Inject 125 mg into the skin every 7 (seven) days. ) 12 Syringe 0 Past Month at Unknown time  . predniSONE (DELTASONE) 5 MG tablet TAKE 1 TABLET BY MOUTH EVERY DAY WITH BREAKFAST (Patient taking differently: Take 5 mg by mouth daily with breakfast. ) 30 tablet 0 11/27/2018  at 0630  . simethicone (MYLICON) 237 MG chewable tablet Chew 125 mg by mouth every 6 (six) hours as needed for flatulence.   11/26/2018 at Unknown time  . traMADol (ULTRAM) 50 MG tablet Take 1 tablet (50 mg total) by mouth every 6 (six) hours as needed. (Patient taking differently: Take 50 mg by mouth every 6 (six) hours as needed for moderate pain. ) 60 tablet 0 11/26/2018 at Unknown time  . triamcinolone (NASACORT) 55 MCG/ACT AERO nasal inhaler Place 2 sprays into the nose daily. 1 Inhaler 12 Past Week at Unknown time    ALLERGIES:   Allergies  Allergen Reactions  . Codeine Other (See Comments)    headaches  . Penicillins     Severe headaches - has tolerated amoxicillin Has patient had a PCN reaction causing immediate rash, facial/tongue/throat swelling, SOB or lightheadedness with hypotension: No Has patient had a PCN reaction causing severe rash involving mucus membranes  or skin necrosis: No Has patient had a PCN reaction that required hospitalization: No Has patient had a PCN reaction occurring within the last 10 years: No If all of the above answers are "NO", then may proceed with Cephalosporin use.   . Bactrim [Sulfamethoxazole-Trimethoprim] Rash    REVIEW OF SYSTEMS:  A comprehensive review of systems was negative except for: Musculoskeletal: positive for arthralgias and bone pain   FAMILY HISTORY:   Family History  Problem Relation Age of Onset  . Arthritis/Rheumatoid Mother   . Uterine cancer Mother 47  . Allergic rhinitis Father   . Heart disease Father   . Drug abuse Son   . Other Maternal Aunt        Unknown GYN CA  . Colon cancer Maternal Uncle 65  . Rectal cancer Maternal Uncle   . Diabetes Maternal Grandmother   . Cancer Maternal Grandfather        d. 77s of liver/lung cancer  . Angioedema Neg Hx   . Asthma Neg Hx   . Atopy Neg Hx   . Eczema Neg Hx   . Immunodeficiency Neg Hx   . Urticaria Neg Hx   . Esophageal cancer Neg Hx   . Stomach cancer Neg Hx     SOCIAL HISTORY:   Social History   Tobacco Use  . Smoking status: Former Smoker    Packs/day: 1.00    Years: 5.00    Pack years: 5.00    Types: Cigarettes    Quit date: 02/05/1998    Years since quitting: 20.8  . Smokeless tobacco: Never Used  Substance Use Topics  . Alcohol use: Not Currently     EXAMINATION:  Vital signs in last 24 hours: Temp:  [98.7 F (37.1 C)] 98.7 F (37.1 C) (10/05 0815) Pulse Rate:  [88] 88 (10/05 0815) Resp:  [16] 16 (10/05 0815) BP: (150)/(86) 150/86 (10/05 0815) SpO2:  [99 %] 99 % (10/05 0815)  BP (!) 150/86   Pulse 88   Temp 98.7 F (37.1 C)   Resp 16   LMP 08/30/2017   SpO2 99%   General Appearance:    Alert, cooperative, no distress, appears stated age  Head:    Normocephalic, without obvious abnormality, atraumatic  Eyes:    PERRL, conjunctiva/corneas clear, EOM's intact, fundi    benign, both eyes  Ears:     Normal TM's and external ear canals, both ears  Nose:   Nares normal, septum midline, mucosa normal, no drainage    or sinus tenderness  Throat:  Lips, mucosa, and tongue normal; teeth and gums normal  Neck:   Supple, symmetrical, trachea midline, no adenopathy;    thyroid:  no enlargement/tenderness/nodules; no carotid   bruit or JVD  Back:     Symmetric, no curvature, ROM normal, no CVA tenderness  Lungs:     Clear to auscultation bilaterally, respirations unlabored  Chest Wall:    No tenderness or deformity   Heart:    Regular rate and rhythm, S1 and S2 normal, no murmur, rub   or gallop  Breast Exam:    No tenderness, masses, or nipple abnormality  Abdomen:     Soft, non-tender, bowel sounds active all four quadrants,    no masses, no organomegaly  Genitalia:    Normal female without lesion, discharge or tenderness  Rectal:    Normal tone, no masses or tenderness;   guaiac negative stool  Extremities:   Extremities normal, atraumatic, no cyanosis or edema  Pulses:   2+ and symmetric all extremities  Skin:   Skin color, texture, turgor normal, no rashes or lesions  Lymph nodes:   Cervical, supraclavicular, and axillary nodes normal  Neurologic:   CNII-XII intact, normal strength, sensation and reflexes    throughout    Musculoskeletal:  ROM 0-120, Ligaments intact,  Imaging Review Plain radiographs demonstrate severe degenerative joint disease of the left knee. The overall alignment is mild varus. The bone quality appears to be good for age and reported activity level.  Assessment/Plan: Primary osteoarthritis, left knee   The patient history, physical examination and imaging studies are consistent with advanced degenerative joint disease of the left knee. The patient has failed conservative treatment.  The clearance notes were reviewed.  After discussion with the patient it was felt that Partial Knee Replacement was indicated. The procedure,  risks, and benefits of partial knee  arthroplasty were presented and reviewed. The risks including but not limited to aseptic loosening, infection, blood clots, vascular injury, stiffness, patella tracking problems complications among others were discussed. The patient acknowledged the explanation, agreed to proceed with the plan.  Preoperative templating of the joint replacement has been completed, documented, and submitted to the Operating Room personnel in order to optimize intra-operative equipment management.    Patient's anticipated LOS is less than 2 midnights, meeting these requirements: - Younger than 46 - Lives within 1 hour of care - Has a competent adult at home to recover with post-op recover - NO history of  - Chronic pain requiring opiods  - Diabetes  - Coronary Artery Disease  - Heart failure  - Heart attack  - Stroke  - DVT/VTE  - Cardiac arrhythmia  - Respiratory Failure/COPD  - Renal failure  - Anemia  - Advanced Liver disease       Donia Ast 11/27/2018, 9:06 AM

## 2018-11-27 NOTE — Transfer of Care (Signed)
Immediate Anesthesia Transfer of Care Note  Patient: Dana Hicks  Procedure(s) Performed: UNICOMPARTMENTAL KNEE (Left Knee)  Patient Location: PACU  Anesthesia Type:Spinal  Level of Consciousness: awake, alert  and oriented  Airway & Oxygen Therapy: Patient Spontanous Breathing and Patient connected to face mask oxygen  Post-op Assessment: Report given to RN and Post -op Vital signs reviewed and stable  Post vital signs: Reviewed and stable  Last Vitals:  Vitals Value Taken Time  BP 133/65 11/27/18 1245  Temp    Pulse 81 11/27/18 1246  Resp 17 11/27/18 1246  SpO2 95 % 11/27/18 1246  Vitals shown include unvalidated device data.  Last Pain:  Vitals:   11/27/18 0956  PainSc: 0-No pain      Patients Stated Pain Goal: 4 ([id number])  Complications: No apparent anesthesia complications

## 2018-11-27 NOTE — Anesthesia Procedure Notes (Addendum)
Anesthesia Regional Block: Adductor canal block   Pre-Anesthetic Checklist: ,, timeout performed, Correct Patient, Correct Site, Correct Laterality, Correct Procedure, Correct Position, site marked, Risks and benefits discussed,  Surgical consent,  Pre-op evaluation,  At surgeon's request and post-op pain management  Laterality: Left  Prep: Maximum Sterile Barrier Precautions used, chloraprep       Needles:  Injection technique: Single-shot  Needle Type: Echogenic Stimulator Needle     Needle Length: 9cm  Needle Gauge: 22     Additional Needles:   Procedures:,,,, ultrasound used (permanent image in chart),,,,  Narrative:  Start time: 11/27/2018 9:30 AM End time: 11/27/2018 9:42 AM Injection made incrementally with aspirations every 5 mL.  Performed by: Personally  Anesthesiologist: Pervis Hocking, DO  Additional Notes: Monitors applied. No increased pain on injection. No increased resistance to injection. Injection made in 5cc increments. Good needle visualization. Patient tolerated procedure well.

## 2018-11-27 NOTE — Progress Notes (Signed)
Assisted Dr. Beth Finucane with left, ultrasound guided, adductor canal block. Side rails up, monitors on throughout procedure. See vital signs in flow sheet. Tolerated Procedure well. ° °

## 2018-11-27 NOTE — Anesthesia Postprocedure Evaluation (Signed)
Anesthesia Post Note  Patient: Dana Hicks  Procedure(s) Performed: UNICOMPARTMENTAL KNEE (Left Knee)     Patient location during evaluation: PACU Anesthesia Type: Regional and Spinal Level of consciousness: oriented and awake and alert Pain management: pain level controlled Vital Signs Assessment: post-procedure vital signs reviewed and stable Respiratory status: spontaneous breathing, respiratory function stable and patient connected to nasal cannula oxygen Cardiovascular status: blood pressure returned to baseline and stable Postop Assessment: no headache, no backache and no apparent nausea or vomiting Anesthetic complications: no    Last Vitals:  Vitals:   11/27/18 1315 11/27/18 1330  BP: 107/70 108/67  Pulse: 69 70  Resp: 15 16  Temp:    SpO2: 100% 95%    Last Pain:  Vitals:   11/27/18 1330  PainSc: [place]

## 2018-11-27 NOTE — Anesthesia Procedure Notes (Signed)
Spinal  Patient location during procedure: OR Start time: 11/27/2018 11:00 AM End time: 11/27/2018 11:05 AM Staffing Anesthesiologist: Pervis Hocking, DO Performed: anesthesiologist  Preanesthetic Checklist Completed: patient identified, surgical consent, pre-op evaluation, timeout performed, IV checked, risks and benefits discussed and monitors and equipment checked Spinal Block Patient position: sitting Prep: site prepped and draped and DuraPrep Patient monitoring: cardiac monitor, continuous pulse ox and blood pressure Approach: midline Location: L3-4 Injection technique: single-shot Needle Needle type: Pencan  Needle gauge: 24 G Needle length: 9 cm Assessment Sensory level: T6 Additional Notes Functioning IV was confirmed and monitors were applied. Sterile prep and drape, including hand hygiene and sterile gloves were used. The patient was positioned and the spine was prepped. The skin was anesthetized with lidocaine.  Free flow of clear CSF was obtained prior to injecting local anesthetic into the CSF.  The spinal needle aspirated freely following injection.  The needle was carefully withdrawn.  The patient tolerated the procedure well.

## 2018-11-27 NOTE — Telephone Encounter (Signed)
Last Visit: 10/16/18 Next Visit: 03/20/19  Okay to refill per Dr. Estanislado Pandy

## 2018-11-27 NOTE — Evaluation (Signed)
Physical Therapy Evaluation Patient Details Name: PINAR STROBL MRN: [id number] DOB: 05-Feb-1976 Today's Date: 11/27/2018   History of Present Illness  L UKA; PMH of RA, colon cancer, DM, ovarian cancer, pancreatic cancer, PE  Clinical Impression  Pt admitted with above diagnosis. Pt ambulated 100' with RW, no loss of balance. Initiated UKA HEP. Good progress expected.  Pt currently with functional limitations due to the deficits listed below (see PT Problem List). Pt will benefit from skilled PT to increase their independence and safety with mobility to allow discharge to the venue listed below.       Follow Up Recommendations Follow surgeon's recommendation for DC plan and follow-up therapies    Equipment Recommendations  3in1 (PT)    Recommendations for Other Services       Precautions / Restrictions Precautions Precautions: Knee Restrictions Weight Bearing Restrictions: No Other Position/Activity Restrictions: WBAT      Mobility  Bed Mobility Overal bed mobility: Modified Independent             General bed mobility comments: HOB up  Transfers Overall transfer level: Needs assistance Equipment used: Rolling walker (2 wheeled) Transfers: Sit to/from Stand Sit to Stand: Min assist         General transfer comment: VCs hand placement, min A to power up  Ambulation/Gait Ambulation/Gait assistance: Supervision;Min guard Gait Distance (Feet): 100 Feet Assistive device: Rolling walker (2 wheeled) Gait Pattern/deviations: Step-through pattern;Step-to pattern;Decreased stride length;Antalgic Gait velocity: decr   General Gait Details: VCs sequencing, no loss of balance  Stairs            Wheelchair Mobility    Modified Rankin (Stroke Patients Only)       Balance Overall balance assessment: Modified Independent                                           Pertinent Vitals/Pain Pain Assessment: 0-10 Pain Score: 4  Pain  Location: L knee Pain Descriptors / Indicators: Sore Pain Intervention(s): Limited activity within patient's tolerance;Monitored during session;Premedicated before session;Ice applied    Home Living Family/patient expects to be discharged to:: Private residence Living Arrangements: Spouse/significant other;Children Available Help at Discharge: Family;Available 24 hours/day   Home Access: Ramped entrance     Home Layout: One level Home Equipment: Walker - 2 wheels;Cane - single point      Prior Function Level of Independence: Independent with assistive device(s)               Hand Dominance        Extremity/Trunk Assessment   Upper Extremity Assessment Upper Extremity Assessment: Overall WFL for tasks assessed    Lower Extremity Assessment Lower Extremity Assessment: LLE deficits/detail LLE Deficits / Details: SLR 3/5, knee ext at least 3/5, 0-45* AAROM knee    Cervical / Trunk Assessment Cervical / Trunk Assessment: Normal  Communication   Communication: No difficulties  Cognition Arousal/Alertness: Awake/alert Behavior During Therapy: WFL for tasks assessed/performed Overall Cognitive Status: Within Functional Limits for tasks assessed                                        General Comments      Exercises Total Joint Exercises Ankle Circles/Pumps: AROM;Both;10 reps;Supine Quad Sets: AROM;Left;5 reps;Supine Heel Slides: AAROM;Left;5 reps;Supine Long Arc Quad: AROM;Left;5  reps;Seated Goniometric ROM: 0-45* L knee AAROM   Assessment/Plan    PT Assessment Patient needs continued PT services  PT Problem List Decreased strength;Decreased range of motion;Decreased activity tolerance;Decreased knowledge of use of DME;Decreased mobility;Pain       PT Treatment Interventions      PT Goals (Current goals can be found in the Care Plan section)  Acute Rehab PT Goals Patient Stated Goal: to walk without pain PT Goal Formulation: With  patient Time For Goal Achievement: 12/04/18 Potential to Achieve Goals: Good    Frequency 7X/week   Barriers to discharge        Co-evaluation               AM-PAC PT "6 Clicks" Mobility  Outcome Measure Help needed turning from your back to your side while in a flat bed without using bedrails?: A Little Help needed moving from lying on your back to sitting on the side of a flat bed without using bedrails?: A Little Help needed moving to and from a bed to a chair (including a wheelchair)?: A Little Help needed standing up from a chair using your arms (e.g., wheelchair or bedside chair)?: A Little Help needed to walk in hospital room?: A Little Help needed climbing 3-5 steps with a railing? : A Lot 6 Click Score: 17    End of Session Equipment Utilized During Treatment: Gait belt Activity Tolerance: Patient tolerated treatment well Patient left: in chair;with call bell/phone within reach;with chair alarm set Nurse Communication: Mobility status PT Visit Diagnosis: Difficulty in walking, not elsewhere classified (R26.2);Pain Pain - Right/Left: Left Pain - part of body: Knee    Time: 1713-1730 PT Time Calculation (min) (ACUTE ONLY): 17 min   Charges:   PT Evaluation $PT Eval Low Complexity: 1 Low          Philomena Doheny PT 11/27/2018  Acute Rehabilitation Services Pager [phone] Office [phone]

## 2018-11-27 NOTE — Op Note (Signed)
UNI KNEE REPLACEMENT OPERATIVE NOTE:  11/27/2018  11:49 AM  PATIENT:  Dana Hicks  43 y.o. female  PRE-OPERATIVE DIAGNOSIS:  LT. KNEE OSTEOARTHRITIS  POST-OPERATIVE DIAGNOSIS:  LT. KNEE OSTEOARTHRITIS  PROCEDURE:  Procedure(s): UNICOMPARTMENTAL KNEE  SURGEON:  Surgeon(s): Vickey Huger, MD  PHYSICIAN ASSISTANT:  Nehemiah Massed PA-C  ANESTHESIA:   spinal  DRAINS: Hemovac  SPECIMEN: None  COUNTS:  Correct  TOURNIQUET:     DICTATION:  Indication for procedure:    The patient is a 43 y.o. female who has failed conservative treatment for LT. KNEE OSTEOARTHRITIS.  Informed consent was obtained prior to anesthesia. The risks versus benefits of the operation were explain and in a way the patient can, and did, understand.     Description of procedure:     The patient was taken to the operating room and placed under anesthesia.  The patient was positioned in the usual fashion taking care that all body parts were adequately padded and/or protected.  I foley catheter was placed.  A tourniquet was applied and the leg prepped and draped in the usual sterile fashion.  The extremity was exsanguinated with the esmarch and tourniquet inflated to 350 mmHg.  Pre-operative range of motion was normal.  The knee was in 4 degree of mild varus.  A midline incision approximately 3-4 inches long was made with a #10 blade.  A new blade was used to make a parapatellar arthrotomy going 1 cm into the quadriceps tendon, over the patella, and alongside the medial aspect of the patellar tendon.  A synovectomy was then performed with the #10 blade and forceps. I then elevated the deep MCL off the medial tibial flare. The knee was put at 90 degrees and the patient specific cutting blocks were used to make our proximal tibial cut and distal femoral cut. The medial meniscus was removed at this point.  I then used the  cutting guide on the femur to drill for lugs and cut the chamfers. Likewise, a 2 tibial  baseplate was used to prepare the tibia. I then trialed the  2 femur and 2 tibia. I trialed several poly inserts and a 10 mm achieved good balance in flexion and extension.  I then irrigated copiously and then mixed the cement. I injected exparel in the deep soft tissues at this point. I then cemented the tibia first followed by the femur and removed excess cement and then inserted the polyethylene. I placed the leg in extension and finished injecting the rest of the exparel. Once the cement was hard, the tourniquet was let down. Hemostasis was obtained. The arthrotomy was closed using a #1 stratofix running suture. The deep soft tissue was closed with #0 vicryls and the subcuticular layer closed with a #2.0 vicryl. The skin was reapproximated and closed using a running 3.0 monocryl. The wound was covered with steristrips, aquacel dressing, and a TED stocking. The patient was awakened, extubated, and taken to recovery in a stable condition.   BLOOD LOSS:  0000000 COMPLICATIONS:  None.  PLAN OF CARE: Admit for overnight observation  PATIENT DISPOSITION:  PACU - hemodynamically stable.    Please fax a copy of this op note to my office at [phone] (please only include page 1 and 2 of the Case Information op note)

## 2018-11-27 NOTE — Anesthesia Preprocedure Evaluation (Signed)
Anesthesia Evaluation  Patient identified by MRN, date of birth, ID band Patient awake    Reviewed: Allergy & Precautions, NPO status , Patient's Chart, lab work & pertinent test results  Airway Mallampati: II  TM Distance: >3 FB Neck ROM: Full    Dental no notable dental hx.    Pulmonary neg pulmonary ROS, former smoker,    Pulmonary exam normal breath sounds clear to auscultation       Cardiovascular negative cardio ROS Normal cardiovascular exam Rhythm:Regular Rate:Normal     Neuro/Psych PSYCHIATRIC DISORDERS Depression Peripheral neuropathy 2/2 chemo    GI/Hepatic Neg liver ROS, GERD  Medicated,Hx pancreatic ca, CRC   Endo/Other  diabetes, Oral Hypoglycemic AgentsMorbid obesityObesity BMI 37  Renal/GU Renal diseasenephrolithiasis   Hx endometrial cancer, ovarian ca    Musculoskeletal  (+) Arthritis , Rheumatoid disorders and steroids,  Oral prednisone, methotrexate injection q3 weeks   Abdominal Normal abdominal exam  (+)   Peds negative pediatric ROS (+)  Hematology  (+) anemia ,   Anesthesia Other Findings   Reproductive/Obstetrics negative OB ROS                           Anesthesia Physical  Anesthesia Plan  ASA: III  Anesthesia Plan: Regional and Spinal   Post-op Pain Management:  Regional for Post-op pain   Induction:   PONV Risk Score and Plan: 4 or greater and 2 and Ondansetron, Dexamethasone, Midazolam, Scopolamine patch - Pre-op and Treatment may vary due to age or medical condition  Airway Management Planned: Natural Airway and Nasal Cannula  Additional Equipment: None  Intra-op Plan:   Post-operative Plan:   Informed Consent: I have reviewed the patients History and Physical, chart, labs and discussed the procedure including the risks, benefits and alternatives for the proposed anesthesia with the patient or authorized representative who has indicated his/her  understanding and acceptance.       Plan Discussed with: CRNA  Anesthesia Plan Comments: (Needs steroid coverage. 50mg  hydrocortisone IV)       Anesthesia Quick Evaluation

## 2018-11-28 ENCOUNTER — Encounter (HOSPITAL_COMMUNITY): Payer: Self-pay | Admitting: Orthopedic Surgery

## 2018-11-28 DIAGNOSIS — D649 Anemia, unspecified: Secondary | ICD-10-CM | POA: Diagnosis not present

## 2018-11-28 DIAGNOSIS — E119 Type 2 diabetes mellitus without complications: Secondary | ICD-10-CM | POA: Diagnosis not present

## 2018-11-28 DIAGNOSIS — Z7952 Long term (current) use of systemic steroids: Secondary | ICD-10-CM | POA: Diagnosis not present

## 2018-11-28 DIAGNOSIS — M069 Rheumatoid arthritis, unspecified: Secondary | ICD-10-CM | POA: Diagnosis not present

## 2018-11-28 DIAGNOSIS — K219 Gastro-esophageal reflux disease without esophagitis: Secondary | ICD-10-CM | POA: Diagnosis not present

## 2018-11-28 DIAGNOSIS — Z8507 Personal history of malignant neoplasm of pancreas: Secondary | ICD-10-CM | POA: Diagnosis not present

## 2018-11-28 DIAGNOSIS — M1712 Unilateral primary osteoarthritis, left knee: Secondary | ICD-10-CM | POA: Diagnosis not present

## 2018-11-28 DIAGNOSIS — T451X5A Adverse effect of antineoplastic and immunosuppressive drugs, initial encounter: Secondary | ICD-10-CM | POA: Diagnosis not present

## 2018-11-28 DIAGNOSIS — F329 Major depressive disorder, single episode, unspecified: Secondary | ICD-10-CM | POA: Diagnosis not present

## 2018-11-28 DIAGNOSIS — G620 Drug-induced polyneuropathy: Secondary | ICD-10-CM | POA: Diagnosis not present

## 2018-11-28 DIAGNOSIS — Z7984 Long term (current) use of oral hypoglycemic drugs: Secondary | ICD-10-CM | POA: Diagnosis not present

## 2018-11-28 DIAGNOSIS — Z86711 Personal history of pulmonary embolism: Secondary | ICD-10-CM | POA: Diagnosis not present

## 2018-11-28 DIAGNOSIS — Z79899 Other long term (current) drug therapy: Secondary | ICD-10-CM | POA: Diagnosis not present

## 2018-11-28 DIAGNOSIS — Z8542 Personal history of malignant neoplasm of other parts of uterus: Secondary | ICD-10-CM | POA: Diagnosis not present

## 2018-11-28 DIAGNOSIS — J302 Other seasonal allergic rhinitis: Secondary | ICD-10-CM | POA: Diagnosis not present

## 2018-11-28 LAB — CBC
HCT: 30.1 % — ABNORMAL LOW (ref 36.0–46.0)
Hemoglobin: 9 g/dL — ABNORMAL LOW (ref 12.0–15.0)
MCH: 26.9 pg (ref 26.0–34.0)
MCHC: 29.9 g/dL — ABNORMAL LOW (ref 30.0–36.0)
MCV: 90.1 fL (ref 80.0–100.0)
Platelets: 556 10*3/uL — ABNORMAL HIGH (ref 150–400)
RBC: 3.34 MIL/uL — ABNORMAL LOW (ref 3.87–5.11)
RDW: 21.2 % — ABNORMAL HIGH (ref 11.5–15.5)
WBC: 15.3 10*3/uL — ABNORMAL HIGH (ref 4.0–10.5)
nRBC: 0 % (ref 0.0–0.2)

## 2018-11-28 LAB — BASIC METABOLIC PANEL
Anion gap: 9 (ref 5–15)
BUN: 10 mg/dL (ref 6–20)
CO2: 28 mmol/L (ref 22–32)
Calcium: 8.5 mg/dL — ABNORMAL LOW (ref 8.9–10.3)
Chloride: 102 mmol/L (ref 98–111)
Creatinine, Ser: 0.59 mg/dL (ref 0.44–1.00)
GFR calc Af Amer: >60 mL/min (ref >60)
GFR calc non Af Amer: >60 mL/min (ref >60)
Glucose, Bld: 103 mg/dL — ABNORMAL HIGH (ref 70–99)
Potassium: 3.5 mmol/L (ref 3.5–5.1)
Sodium: 139 mmol/L (ref 135–145)

## 2018-11-28 MED ORDER — ASPIRIN 325 MG PO TBEC: 325.0000 mg | DELAYED_RELEASE_TABLET | Freq: Two times a day (BID) | ORAL | 0 refills | Status: DC

## 2018-11-28 MED ORDER — OXYCODONE HCL 5 MG PO TABS: 5.0000 mg | ORAL_TABLET | Freq: Four times a day (QID) | ORAL | 0 refills | Status: DC | PRN

## 2018-11-28 NOTE — TOC Transition Note (Signed)
Transition of Care [facility]) - CM/SW Discharge Note   Patient Details  Name: Dana Hicks MRN: [id number] Date of Birth: 1975/10/25  Transition of Care [facility]) CM/SW Contact:  Lia Hopping, [place] Phone Number: 11/28/2018, 12:32 PM   Clinical Narrative:    Therapy Plan: Straight to Outpatient  Has RW, 3 IN 1 ordered through [facility]   Final next level of care: OP Rehab Barriers to Discharge: No Barriers Identified   Patient Goals and CMS Choice        Discharge Placement                       Discharge Plan and Services                DME Arranged: 3-N-1 DME Agency: [employer] Date DME Agency Contacted: 11/28/18 Time DME Agency Contacted: 0900 Representative spoke with at DME Agency: [place] ([place]) Interventions     Readmission Risk Interventions No flowsheet data found.

## 2018-11-28 NOTE — Progress Notes (Signed)
Physical Therapy Treatment Patient Details Name: Dana Hicks MRN: [id number] DOB: 10-Jun-1975 Today's Date: 11/28/2018    History of Present Illness L UKA; PMH of RA, colon cancer, DM, ovarian cancer, pancreatic cancer, PE    PT Comments    Pt has met PT goals and is ready to DC home from PT standpoint. She ambulated 180', completed stair training, and demonstrates good understanding of HEP.   Follow Up Recommendations  Follow surgeon's recommendation for DC plan and follow-up therapies     Equipment Recommendations  3in1 (PT)    Recommendations for Other Services       Precautions / Restrictions Precautions Precautions: Knee Restrictions Weight Bearing Restrictions: No Other Position/Activity Restrictions: WBAT    Mobility  Bed Mobility               General bed mobility comments: up in recliner  Transfers Overall transfer level: Needs assistance Equipment used: Rolling walker (2 wheeled) Transfers: Sit to/from Stand Sit to Stand: Supervision         General transfer comment: VCs hand placement  Ambulation/Gait Ambulation/Gait assistance: Supervision Gait Distance (Feet): 180 Feet Assistive device: Rolling walker (2 wheeled) Gait Pattern/deviations: Step-through pattern;Step-to pattern;Decreased stride length;Antalgic Gait velocity: decr   General Gait Details: no loss of balance, VCs for knee flexion with swing phase on L   Stairs Stairs: Yes Stairs assistance: Supervision Stair Management: Two rails;Step to pattern;Forwards Number of Stairs: 3 General stair comments: VCs sequencing, Min/guard safety   Wheelchair Mobility    Modified Rankin (Stroke Patients Only)       Balance Overall balance assessment: Modified Independent                                          Cognition Arousal/Alertness: Awake/alert Behavior During Therapy: WFL for tasks assessed/performed Overall Cognitive Status: Within Functional Limits  for tasks assessed                                        Exercises Total Joint Exercises Ankle Circles/Pumps: AROM;Both;10 reps;Supine Quad Sets: AROM;Left;5 reps;Supine Short Arc Quad: AROM;Left;10 reps;Supine Heel Slides: AAROM;Left;Supine;10 reps Hip ABduction/ADduction: AROM;Left;10 reps;Supine Straight Leg Raises: AROM;Left;10 reps;Supine Long Arc Quad: AROM;Left;Seated;10 reps Knee Flexion: AROM;AAROM;Left;10 reps;Seated Goniometric ROM: 0-40* AAROM L knee    General Comments        Pertinent Vitals/Pain Pain Score: 5  Pain Location: L knee Pain Descriptors / Indicators: Sore;Aching Pain Intervention(s): Limited activity within patient's tolerance;Monitored during session;Premedicated before session;Ice applied    Home Living                      Prior Function            PT Goals (current goals can now be found in the care plan section) Acute Rehab PT Goals Patient Stated Goal: to walk without pain PT Goal Formulation: With patient Time For Goal Achievement: 12/04/18 Potential to Achieve Goals: Good Progress towards PT goals: Goals met/education completed, patient discharged from PT    Frequency    7X/week      PT Plan Current plan remains appropriate    Co-evaluation              AM-PAC PT "6 Clicks" Mobility   Outcome Measure  Help needed turning  from your back to your side while in a flat bed without using bedrails?: None Help needed moving from lying on your back to sitting on the side of a flat bed without using bedrails?: None Help needed moving to and from a bed to a chair (including a wheelchair)?: None Help needed standing up from a chair using your arms (e.g., wheelchair or bedside chair)?: None Help needed to walk in hospital room?: None Help needed climbing 3-5 steps with a railing? : A Little 6 Click Score: 23    End of Session Equipment Utilized During Treatment: Gait belt Activity Tolerance: Patient  tolerated treatment well Patient left: in chair;with call bell/phone within reach Nurse Communication: Mobility status PT Visit Diagnosis: Difficulty in walking, not elsewhere classified (R26.2);Pain Pain - Right/Left: Left Pain - part of body: Knee     Time: 4932-4199 PT Time Calculation (min) (ACUTE ONLY): 21 min  Charges:  $Gait Training: 8-22 mins                     Blondell Reveal Kistler PT 11/28/2018  Acute Rehabilitation Services Pager [phone] Office [phone]

## 2018-11-28 NOTE — Progress Notes (Signed)
SPORTS MEDICINE AND JOINT REPLACEMENT  Lara Mulch, MD    Carlyon Shadow, PA-C [place], [place], [place]  [address]                             [phone]   PROGRESS NOTE  Subjective:  negative for Chest Pain  negative for Shortness of Breath  negative for Nausea/Vomiting   negative for Calf Pain  negative for Bowel Movement   Tolerating Diet: yes         Patient reports pain as 3 on 0-10 scale.    Objective: Vital signs in last 24 hours:    Patient Vitals for the past 24 hrs:  BP Temp Temp src Pulse Resp SpO2 Height Weight  11/28/18 0550 122/77 98.6 F (37 C) - 62 18 100 % - -  11/28/18 0149 120/73 97.9 F (36.6 C) Oral 60 16 98 % - -  11/27/18 2228 - - - - - - 5\' 2"  (1.575 m) 93 kg  11/27/18 2227 127/77 98.1 F (36.7 C) - (!) 56 16 99 % - -  11/27/18 1742 112/74 98 F (36.7 C) - 66 16 95 % - -  11/27/18 1607 116/69 98 F (36.7 C) Oral 66 16 98 % - -  11/27/18 1455 122/81 98.4 F (36.9 C) Oral 69 - 99 % - -  11/27/18 1421 109/69 98.4 F (36.9 C) Oral 74 - 92 % - -  11/27/18 1409 111/73 - - 73 17 95 % - -  11/27/18 1400 104/74 98.2 F (36.8 C) - 71 13 95 % - -  11/27/18 1345 122/81 - - 75 14 96 % - -  11/27/18 1330 108/67 - - 78 17 94 % - -  11/27/18 1315 107/70 - - 69 15 100 % - -  11/27/18 1300 112/69 - - 70 17 100 % - -  11/27/18 1245 133/65 98.5 F (36.9 C) - 81 (!) 21 97 % - -  11/27/18 1011 123/74 - - 79 18 99 % - -  11/27/18 0956 126/73 - - 82 17 100 % - -  11/27/18 0951 130/72 - - 78 17 98 % - -  11/27/18 0946 131/71 - - 82 20 99 % - -  11/27/18 0941 131/74 - - 81 12 98 % - -  11/27/18 0935 - - - 78 19 99 % - -  11/27/18 0815 (!) 150/86 98.7 F (37.1 C) - 88 16 99 % - -    @flow {1959:LAST@   Intake/Output from previous day:   10/05 0701 - 10/06 0700 In: 3623.5 [P.O.:840; I.V.:2401.9] Out: 3750 [Urine:3700]   Intake/Output this shift:   No intake/output data recorded.   Intake/Output      10/05 0701 - 10/06 0700 10/06 0701 -  10/07 0700   P.O. 840    I.V. (mL/kg) 2401.9 (25.8)    IV Piggyback 381.6    Total Intake(mL/kg) 3623.5 (39)    Urine (mL/kg/hr) 3700    Blood 50    Total Output 3750    Net -126.5            LABORATORY DATA: Recent Labs    11/21/18 0928 11/27/18 1258 11/28/18 0229  WBC 14.6* 12.9* 15.3*  HGB 10.7* 10.0* 9.0*  HCT 35.2* 33.6* 30.1*  PLT 631* 658* 556*   Recent Labs    11/21/18 0928 11/27/18 1258 11/28/18 0229  NA 137 139 139  K  3.8 4.4 3.5  CL 100 107 102  CO2 26 25 28   BUN 12 13 10   CREATININE 0.57 0.61 0.59  GLUCOSE 120* 149* 103*  CALCIUM 8.9 8.6* 8.5*   Lab Results  Component Value Date   INR 0.96 11/07/2017   INR 0.94 09/26/2017    Examination:  General appearance: alert, cooperative and no distress Extremities: extremities normal, atraumatic, no cyanosis or edema  Wound Exam: clean, dry, intact   Drainage:  None: wound tissue dry  Motor Exam: Quadriceps and Hamstrings Intact  Sensory Exam: Superficial Peroneal, Deep Peroneal and Tibial normal   Assessment:    1 Day Post-Op  Procedure(s) (LRB): UNICOMPARTMENTAL KNEE (Left)  ADDITIONAL DIAGNOSIS:  Active Problems:   S/P left unicompartmental knee replacement     Plan: Physical Therapy as ordered Weight Bearing as Tolerated (WBAT)  DVT Prophylaxis:  Aspirin  DISCHARGE PLAN: Home        Patient's anticipated LOS is less than 2 midnights, meeting these requirements: - Younger than 21 - Lives within 1 hour of care - Has a competent adult at home to recover with post-op recover - NO history of  - Chronic pain requiring opiods  - Diabetes  - Coronary Artery Disease  - Heart failure  - Heart attack  - Stroke  - DVT/VTE  - Cardiac arrhythmia  - Respiratory Failure/COPD  - Renal failure  - Anemia  - Advanced Liver disease        Donia Ast 11/28/2018, 7:47 AM

## 2018-11-28 NOTE — Discharge Summary (Signed)
SPORTS MEDICINE & JOINT REPLACEMENT   Dana Mulch, MD   Carlyon Shadow, PA-C [place], [place], [place]  [address]                             [phone]  PATIENT ID: [name]        MRN:  [id number]          DOB/AGE: 1975-05-17 / 43 y.o.    DISCHARGE SUMMARY  ADMISSION DATE:    11/27/2018 DISCHARGE DATE:   11/28/2018   ADMISSION DIAGNOSIS: LT. KNEE OSTEOARTHRITIS    DISCHARGE DIAGNOSIS:  LT. KNEE OSTEOARTHRITIS    ADDITIONAL DIAGNOSIS: Active Problems:   S/P left unicompartmental knee replacement  Past Medical History:  Diagnosis Date  . Allergic rhinitis, seasonal   . 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 Depression   . Diabetes mellitus without complication ([place])   . Endometrial cancer ([place]) 08/2017   Stage IA, Grade 1  . GERD (gastroesophageal reflux disease)   . History of MRSA infection 01/2017   followed by infectious disease center--  recurrent pustular folliculitis  . Nephrolithiasis    bilateral nonobstructive calculi per CT 08-18-2017  . OA (osteoarthritis)   . Ovarian cancer, right ([place]) 08/2017   Stage II Grade 2 Endometrioid  . Pancreatic cancer ([place])   . Rheumatoid arthritis [facility])    rheumatologist-  dr devenswar-- treated w/ oral prednisone daily and methotrexate injection every 3 wks    PROCEDURE: Procedure(s): UNICOMPARTMENTAL KNEE on 11/27/2018  CONSULTS:    HISTORY:  See H&P in chart  HOSPITAL COURSE:  Dana Hicks is a 43 y.o. admitted on 11/27/2018 and found to have a diagnosis of LT. KNEE OSTEOARTHRITIS.  After appropriate laboratory studies were obtained  they were taken to the operating room on 11/27/2018 and underwent Procedure(s): UNICOMPARTMENTAL KNEE.   They were given perioperative antibiotics:  Anti-infectives (From admission, onward)   Start     Dose/Rate Route Frequency Ordered Stop   11/27/18 1700  ceFAZolin (ANCEF) IVPB 2g/100 mL premix      2 g 200 mL/hr over 30 Minutes Intravenous Every 6 hours 11/27/18 1430 11/28/18 0008   11/27/18 0915  ceFAZolin (ANCEF) IVPB 2g/100 mL premix     2 g 200 mL/hr over 30 Minutes Intravenous On call to O.R. 11/27/18 0912 11/27/18 1104    .  Patient given tranexamic acid IV or topical and exparel intra-operatively.  Tolerated the procedure well.    POD# 1: Vital signs were stable.  Patient denied Chest pain, shortness of breath, or calf pain.  Patient was started on Aspirin twice daily at 8am.  Consults to PT, OT, and care management were made.  The patient was weight bearing as tolerated.  CPM was placed on the operative leg 0-90 degrees for 6-8 hours a day. When out of the CPM, patient was placed in the foam block to achieve full extension. Incentive spirometry was taught.  Dressing was changed.       POD #2, Continued  PT for ambulation and exercise program.  IV saline locked.  O2 discontinued.    The remainder of the hospital course was dedicated to ambulation and strengthening.   The patient was discharged on 1 Day Post-Op in  Good condition.  Blood products given:none  DIAGNOSTIC STUDIES: Recent vital signs:  Patient Vitals for the past 24 hrs:  BP Temp Temp src Pulse Resp SpO2 Height Weight  11/28/18 0550 122/77 98.6 F (37 C) - 62 18 100 % - -  11/28/18 0149 120/73 97.9 F (36.6 C) Oral 60 16 98 % - -  11/27/18 2228 - - - - - - 5\' 2"  (1.575 m) 93 kg  11/27/18 2227 127/77 98.1 F (36.7 C) - (!) 56 16 99 % - -  11/27/18 1742 112/74 98 F (36.7 C) - 66 16 95 % - -  11/27/18 1607 116/69 98 F (36.7 C) Oral 66 16 98 % - -  11/27/18 1455 122/81 98.4 F (36.9 C) Oral 69 - 99 % - -  11/27/18 1421 109/69 98.4 F (36.9 C) Oral 74 - 92 % - -  11/27/18 1409 111/73 - - 73 17 95 % - -  11/27/18 1400 104/74 98.2 F (36.8 C) - 71 13 95 % - -  11/27/18 1345 122/81 - - 75 14 96 % - -  11/27/18 1330 108/67 - - 78 17 94 % - -  11/27/18 1315 107/70 - - 69 15 100 % - -  11/27/18 1300  112/69 - - 70 17 100 % - -  11/27/18 1245 133/65 98.5 F (36.9 C) - 81 (!) 21 97 % - -  11/27/18 1011 123/74 - - 79 18 99 % - -  11/27/18 0956 126/73 - - 82 17 100 % - -  11/27/18 0951 130/72 - - 78 17 98 % - -  11/27/18 0946 131/71 - - 82 20 99 % - -  11/27/18 0941 131/74 - - 81 12 98 % - -  11/27/18 0935 - - - 78 19 99 % - -  11/27/18 0815 (!) 150/86 98.7 F (37.1 C) - 88 16 99 % - -       Recent laboratory studies: Recent Labs    11/21/18 0928 11/27/18 1258 11/28/18 0229  WBC 14.6* 12.9* 15.3*  HGB 10.7* 10.0* 9.0*  HCT 35.2* 33.6* 30.1*  PLT 631* 658* 556*   Recent Labs    11/21/18 0928 11/27/18 1258 11/28/18 0229  NA 137 139 139  K 3.8 4.4 3.5  CL 100 107 102  CO2 26 25 28   BUN 12 13 10   CREATININE 0.57 0.61 0.59  GLUCOSE 120* 149* 103*  CALCIUM 8.9 8.6* 8.5*   Lab Results  Component Value Date   INR 0.96 11/07/2017   INR 0.94 09/26/2017     Recent Radiographic Studies :  No results found.  DISCHARGE INSTRUCTIONS: Discharge Instructions    Call MD / Call 911   Complete by: As directed    If you experience chest pain or shortness of breath, CALL 911 and be transported to the hospital emergency room.  If you develope a fever above 101 F, pus (white drainage) or increased drainage or redness at the wound, or calf pain, call your surgeon's office.   Constipation Prevention   Complete by: As directed    Drink plenty of fluids.  Prune juice may be helpful.  You may use a stool softener, such as Colace (over the counter) 100 mg twice a day.  Use MiraLax (over the counter) for constipation as needed.   Diet - low sodium heart healthy   Complete by: As directed    Discharge instructions   Complete by: As directed    INSTRUCTIONS AFTER JOINT REPLACEMENT   Remove items at home which could result in a fall. This includes throw rugs  or furniture in walking pathways ICE to the affected joint every three hours while awake for 30 minutes at a time, for at least the  first 3-5 days, and then as needed for pain and swelling.  Continue to use ice for pain and swelling. You may notice swelling that will progress down to the foot and ankle.  This is normal after surgery.  Elevate your leg when you are not up walking on it.   Continue to use the breathing machine you got in the hospital (incentive spirometer) which will help keep your temperature down.  It is common for your temperature to cycle up and down following surgery, especially at night when you are not up moving around and exerting yourself.  The breathing machine keeps your lungs expanded and your temperature down.   DIET:  As you were doing prior to hospitalization, we recommend a well-balanced diet.  DRESSING / WOUND CARE / SHOWERING  Keep the surgical dressing until follow up.  The dressing is water proof, so you can shower without any extra covering.  IF THE DRESSING FALLS OFF or the wound gets wet inside, change the dressing with sterile gauze.  Please use good hand washing techniques before changing the dressing.  Do not use any lotions or creams on the incision until instructed by your surgeon.    ACTIVITY  Increase activity slowly as tolerated, but follow the weight bearing instructions below.   No driving for 6 weeks or until further direction given by your physician.  You cannot drive while taking narcotics.  No lifting or carrying greater than 10 lbs. until further directed by your surgeon. Avoid periods of inactivity such as sitting longer than an hour when not asleep. This helps prevent blood clots.  You may return to work once you are authorized by your doctor.     WEIGHT BEARING   Weight bearing as tolerated with assist device (walker, cane, etc) as directed, use it as long as suggested by your surgeon or therapist, typically at least 4-6 weeks.   EXERCISES  Results after joint replacement surgery are often greatly improved when you follow the exercise, range of motion and muscle  strengthening exercises prescribed by your doctor. Safety measures are also important to protect the joint from further injury. Any time any of these exercises cause you to have increased pain or swelling, decrease what you are doing until you are comfortable again and then slowly increase them. If you have problems or questions, call your caregiver or physical therapist for advice.   Rehabilitation is important following a joint replacement. After just a few days of immobilization, the muscles of the leg can become weakened and shrink (atrophy).  These exercises are designed to build up the tone and strength of the thigh and leg muscles and to improve motion. Often times heat used for twenty to thirty minutes before working out will loosen up your tissues and help with improving the range of motion but do not use heat for the first two weeks following surgery (sometimes heat can increase post-operative swelling).   These exercises can be done on a training (exercise) mat, on the floor, on a table or on a bed. Use whatever works the best and is most comfortable for you.    Use music or television while you are exercising so that the exercises are a pleasant break in your day. This will make your life better with the exercises acting as a break in your routine that you  can look forward to.   Perform all exercises about fifteen times, three times per day or as directed.  You should exercise both the operative leg and the other leg as well.   Exercises include:   Quad Sets - Tighten up the muscle on the front of the thigh (Quad) and hold for 5-10 seconds.   Straight Leg Raises - With your knee straight (if you were given a brace, keep it on), lift the leg to 60 degrees, hold for 3 seconds, and slowly lower the leg.  Perform this exercise against resistance later as your leg gets stronger.  Leg Slides: Lying on your back, slowly slide your foot toward your buttocks, bending your knee up off the floor (only go  as far as is comfortable). Then slowly slide your foot back down until your leg is flat on the floor again.  Angel Wings: Lying on your back spread your legs to the side as far apart as you can without causing discomfort.  Hamstring Strength:  Lying on your back, push your heel against the floor with your leg straight by tightening up the muscles of your buttocks.  Repeat, but this time bend your knee to a comfortable angle, and push your heel against the floor.  You may put a pillow under the heel to make it more comfortable if necessary.   A rehabilitation program following joint replacement surgery can speed recovery and prevent re-injury in the future due to weakened muscles. Contact your doctor or a physical therapist for more information on knee rehabilitation.    CONSTIPATION  Constipation is defined medically as fewer than three stools per week and severe constipation as less than one stool per week.  Even if you have a regular bowel pattern at home, your normal regimen is likely to be disrupted due to multiple reasons following surgery.  Combination of anesthesia, postoperative narcotics, change in appetite and fluid intake all can affect your bowels.   YOU MUST use at least one of the following options; they are listed in order of increasing strength to get the job done.  They are all available over the counter, and you may need to use some, POSSIBLY even all of these options:    Drink plenty of fluids (prune juice may be helpful) and high fiber foods Colace 100 mg by mouth twice a day  Senokot for constipation as directed and as needed Dulcolax (bisacodyl), take with full glass of water  Miralax (polyethylene glycol) once or twice a day as needed.  If you have tried all these things and are unable to have a bowel movement in the first 3-4 days after surgery call either your surgeon or your primary doctor.    If you experience loose stools or diarrhea, hold the medications until you  stool forms back up.  If your symptoms do not get better within 1 week or if they get worse, check with your doctor.  If you experience "the worst abdominal pain ever" or develop nausea or vomiting, please contact the office immediately for further recommendations for treatment.   ITCHING:  If you experience itching with your medications, try taking only a single pain pill, or even half a pain pill at a time.  You can also use Benadryl over the counter for itching or also to help with sleep.   TED HOSE STOCKINGS:  Use stockings on both legs until for at least 2 weeks or as directed by physician office. They may be removed at  night for sleeping.  MEDICATIONS:  See your medication summary on the "After Visit Summary" that nursing will review with you.  You may have some home medications which will be placed on hold until you complete the course of blood thinner medication.  It is important for you to complete the blood thinner medication as prescribed.  PRECAUTIONS:  If you experience chest pain or shortness of breath - call 911 immediately for transfer to the hospital emergency department.   If you develop a fever greater that 101 F, purulent drainage from wound, increased redness or drainage from wound, foul odor from the wound/dressing, or calf pain - CONTACT YOUR SURGEON.                                                   FOLLOW-UP APPOINTMENTS:  If you do not already have a post-op appointment, please call the office for an appointment to be seen by your surgeon.  Guidelines for how soon to be seen are listed in your "After Visit Summary", but are typically between 1-4 weeks after surgery.  OTHER INSTRUCTIONS:   Knee Replacement:  Do not place pillow under knee, focus on keeping the knee straight while resting. CPM instructions: 0-90 degrees, 2 hours in the morning, 2 hours in the afternoon, and 2 hours in the evening. Place foam block, curve side up under heel at all times except when in CPM or  when walking.  DO NOT modify, tear, cut, or change the foam block in any way.  MAKE SURE YOU:  Understand these instructions.  Get help right away if you are not doing well or get worse.    Thank you for letting us be a part of your medical care team.  It is a privilege we respect greatly.  We hope these instructions will help you stay on track for a fast and full recovery!   Increase activity slowly as tolerated   Complete by: As directed       DISCHARGE MEDICATIONS:   Allergies as of 11/28/2018      Reactions   Codeine Other (See Comments)   headaches   Penicillins    Severe headaches - has tolerated amoxicillin Has patient had a PCN reaction causing immediate rash, facial/tongue/throat swelling, SOB or lightheadedness with hypotension: No Has patient had a PCN reaction causing severe rash involving mucus membranes or skin necrosis: No Has patient had a PCN reaction that required hospitalization: No Has patient had a PCN reaction occurring within the last 10 years: No If all of the above answers are "NO", then may proceed with Cephalosporin use.   Bactrim [sulfamethoxazole-trimethoprim] Rash      Medication List    STOP taking these medications   acetaminophen 650 MG CR tablet Commonly known as: TYLENOL   diclofenac sodium 1 % Gel Commonly known as: VOLTAREN   lidocaine-prilocaine cream Commonly known as: EMLA   naproxen sodium 220 MG tablet Commonly known as: ALEVE     TAKE these medications   aspirin 325 MG EC tablet Take 1 tablet (325 mg total) by mouth 2 (two) times daily.   citalopram 20 MG tablet Commonly known as: CELEXA TAKE 1 TABLET BY MOUTH EVERY DAY   folic acid 1 MG tablet Commonly known as: FOLVITE Take 2 tablets (2 mg total) by mouth daily.   gabapentin 300  MG capsule Commonly known as: NEURONTIN TAKE 1 CAPSULE BY MOUTH THREE TIMES A DAY What changed:   how much to take  how to take this  when to take this  additional instructions    lipase/protease/amylase 12000 units Cpep capsule Commonly known as: CREON Take 12,000 Units by mouth See admin instructions. Take 1 capsule before and after each meal. Take 1 capsule before each snack.   loratadine 10 MG tablet Commonly known as: CLARITIN Take 10 mg by mouth daily after breakfast.   metFORMIN 500 MG tablet Commonly known as: GLUCOPHAGE TAKE 1 TABLET (500 MG TOTAL) BY MOUTH 2 (TWO) TIMES DAILY WITH A MEAL. What changed: See the new instructions.   methocarbamol 500 MG tablet Commonly known as: ROBAXIN TAKE 1 TABLET BY MOUTH 3 TIMES DAILY AS NEEDED FOR MUSCLE SPASMS. What changed: See the new instructions.   Methotrexate (PF) 20 MG/0.4ML Soaj Inject 20 mg into the skin once a week.   multivitamin with minerals Tabs tablet Take 1 tablet by mouth daily.   omeprazole 20 MG capsule Commonly known as: PRILOSEC Take 20 mg by mouth daily as needed (heartburn).   Orencia ClickJect 0000000 MG/ML Soaj Generic drug: Abatacept INJECT ONE CLICKJECT PEN SUBCUTANEOUSLY EVERY WEEK. REFRIGERATE. ALLOWTO WARM TO ROOM TEMPERATURE PRIOR TO ADMINISTRATION. What changed: See the new instructions.   oxyCODONE 5 MG immediate release tablet Commonly known as: Oxy IR/ROXICODONE Take 1-2 tablets (5-10 mg total) by mouth every 6 (six) hours as needed for moderate pain (pain score 4-6).   predniSONE 5 MG tablet Commonly known as: DELTASONE TAKE 1 TABLET BY MOUTH EVERY DAY WITH BREAKFAST What changed: See the new instructions.   simethicone 125 MG chewable tablet Commonly known as: MYLICON Chew 0000000 mg by mouth every 6 (six) hours as needed for flatulence.   traMADol 50 MG tablet Commonly known as: ULTRAM Take 1 tablet (50 mg total) by mouth every 6 (six) hours as needed. What changed: reasons to take this   triamcinolone 55 MCG/ACT Aero nasal inhaler Commonly known as: NASACORT Place 2 sprays into the nose daily.            Durable Medical Equipment  (From admission,  onward)         Start     Ordered   11/27/18 1431  DME Walker rolling  Once    Question:  Patient needs a walker to treat with the following condition  Answer:  S/P left unicompartmental knee replacement   11/27/18 1430   11/27/18 1431  DME 3 n 1  Once     11/27/18 1430   11/27/18 1431  DME Bedside commode  Once    Question:  Patient needs a bedside commode to treat with the following condition  Answer:  S/P left unicompartmental knee replacement   11/27/18 1430          FOLLOW UP VISIT:    DISPOSITION: HOME VS. SNF  CONDITION:  Good   Donia Ast 11/28/2018, 7:51 AM

## 2018-11-30 ENCOUNTER — Encounter: Payer: BC Managed Care – PPO | Admitting: Physical Therapy

## 2018-12-04 ENCOUNTER — Telehealth: Payer: Self-pay | Admitting: *Deleted

## 2018-12-04 ENCOUNTER — Encounter: Payer: Self-pay | Admitting: Hematology and Oncology

## 2018-12-04 ENCOUNTER — Other Ambulatory Visit: Payer: Self-pay | Admitting: Hematology and Oncology

## 2018-12-04 DIAGNOSIS — Z85038 Personal history of other malignant neoplasm of large intestine: Secondary | ICD-10-CM | POA: Diagnosis not present

## 2018-12-04 DIAGNOSIS — Z8507 Personal history of malignant neoplasm of pancreas: Secondary | ICD-10-CM | POA: Diagnosis not present

## 2018-12-04 DIAGNOSIS — K8681 Exocrine pancreatic insufficiency: Secondary | ICD-10-CM | POA: Diagnosis not present

## 2018-12-04 NOTE — Telephone Encounter (Signed)
Patient called for a refill of Tramadol. Please send to [place].

## 2018-12-05 ENCOUNTER — Other Ambulatory Visit: Payer: Self-pay | Admitting: Hematology and Oncology

## 2018-12-05 ENCOUNTER — Telehealth: Payer: Self-pay | Admitting: *Deleted

## 2018-12-05 DIAGNOSIS — M25662 Stiffness of left knee, not elsewhere classified: Secondary | ICD-10-CM | POA: Diagnosis not present

## 2018-12-05 DIAGNOSIS — C252 Malignant neoplasm of tail of pancreas: Secondary | ICD-10-CM

## 2018-12-05 DIAGNOSIS — R262 Difficulty in walking, not elsewhere classified: Secondary | ICD-10-CM | POA: Diagnosis not present

## 2018-12-05 DIAGNOSIS — Z96652 Presence of left artificial knee joint: Secondary | ICD-10-CM | POA: Diagnosis not present

## 2018-12-05 DIAGNOSIS — M1712 Unilateral primary osteoarthritis, left knee: Secondary | ICD-10-CM | POA: Diagnosis not present

## 2018-12-05 MED ORDER — TRAMADOL HCL 50 MG PO TABS: 50.0000 mg | ORAL_TABLET | Freq: Four times a day (QID) | ORAL | 0 refills | Status: DC | PRN

## 2018-12-05 NOTE — Telephone Encounter (Signed)
[facility] faxed Prior authorization request for Tramodol HCL 50 mg.  Request to [employer] letter tray receptacle of Prior Authorization requests and forms for review this morning.

## 2018-12-06 ENCOUNTER — Other Ambulatory Visit: Payer: Self-pay | Admitting: Hematology and Oncology

## 2018-12-06 DIAGNOSIS — C541 Malignant neoplasm of endometrium: Secondary | ICD-10-CM

## 2018-12-06 DIAGNOSIS — C7961 Secondary malignant neoplasm of right ovary: Secondary | ICD-10-CM

## 2018-12-07 ENCOUNTER — Other Ambulatory Visit: Payer: Self-pay

## 2018-12-07 ENCOUNTER — Inpatient Hospital Stay: Payer: BC Managed Care – PPO

## 2018-12-07 ENCOUNTER — Inpatient Hospital Stay: Payer: BC Managed Care – PPO | Attending: Hematology and Oncology

## 2018-12-07 DIAGNOSIS — C252 Malignant neoplasm of tail of pancreas: Secondary | ICD-10-CM | POA: Insufficient documentation

## 2018-12-07 DIAGNOSIS — C541 Malignant neoplasm of endometrium: Secondary | ICD-10-CM | POA: Diagnosis not present

## 2018-12-07 DIAGNOSIS — Z96652 Presence of left artificial knee joint: Secondary | ICD-10-CM | POA: Diagnosis not present

## 2018-12-07 DIAGNOSIS — C7961 Secondary malignant neoplasm of right ovary: Secondary | ICD-10-CM

## 2018-12-07 DIAGNOSIS — M25662 Stiffness of left knee, not elsewhere classified: Secondary | ICD-10-CM | POA: Diagnosis not present

## 2018-12-07 DIAGNOSIS — R262 Difficulty in walking, not elsewhere classified: Secondary | ICD-10-CM | POA: Diagnosis not present

## 2018-12-07 LAB — CBC WITH DIFFERENTIAL/PLATELET
Abs Immature Granulocytes: 0.16 10*3/uL — ABNORMAL HIGH (ref 0.00–0.07)
Basophils Absolute: 0.1 10*3/uL (ref 0.0–0.1)
Basophils Relative: 1 %
Eosinophils Absolute: 0.2 10*3/uL (ref 0.0–0.5)
Eosinophils Relative: 2 %
HCT: 31.4 % — ABNORMAL LOW (ref 36.0–46.0)
Hemoglobin: 9.7 g/dL — ABNORMAL LOW (ref 12.0–15.0)
Immature Granulocytes: 1 %
Lymphocytes Relative: 35 %
Lymphs Abs: 4.3 10*3/uL — ABNORMAL HIGH (ref 0.7–4.0)
MCH: 26.6 pg (ref 26.0–34.0)
MCHC: 30.9 g/dL (ref 30.0–36.0)
MCV: 86.3 fL (ref 80.0–100.0)
Monocytes Absolute: 0.9 10*3/uL (ref 0.1–1.0)
Monocytes Relative: 8 %
Neutro Abs: 6.8 10*3/uL (ref 1.7–7.7)
Neutrophils Relative %: 53 %
Platelets: 888 10*3/uL — ABNORMAL HIGH (ref 150–400)
RBC: 3.64 MIL/uL — ABNORMAL LOW (ref 3.87–5.11)
RDW: 20.4 % — ABNORMAL HIGH (ref 11.5–15.5)
WBC: 12.4 10*3/uL — ABNORMAL HIGH (ref 4.0–10.5)
nRBC: 0 % (ref 0.0–0.2)

## 2018-12-07 LAB — AMYLASE: Amylase: 26 U/L — ABNORMAL LOW (ref 28–100)

## 2018-12-07 LAB — COMPREHENSIVE METABOLIC PANEL
ALT: 34 U/L (ref 0–44)
AST: 16 U/L (ref 15–41)
Albumin: 3 g/dL — ABNORMAL LOW (ref 3.5–5.0)
Alkaline Phosphatase: 117 U/L (ref 38–126)
Anion gap: 11 (ref 5–15)
BUN: 18 mg/dL (ref 6–20)
CO2: 26 mmol/L (ref 22–32)
Calcium: 9.2 mg/dL (ref 8.9–10.3)
Chloride: 103 mmol/L (ref 98–111)
Creatinine, Ser: 0.69 mg/dL (ref 0.44–1.00)
GFR calc Af Amer: >60 mL/min (ref >60)
GFR calc non Af Amer: >60 mL/min (ref >60)
Glucose, Bld: 115 mg/dL — ABNORMAL HIGH (ref 70–99)
Potassium: 3.7 mmol/L (ref 3.5–5.1)
Sodium: 140 mmol/L (ref 135–145)
Total Bilirubin: <0.2 mg/dL — ABNORMAL LOW (ref 0.3–1.2)
Total Protein: 7.1 g/dL (ref 6.5–8.1)

## 2018-12-07 LAB — LIPASE, BLOOD: Lipase: 38 U/L (ref 11–51)

## 2018-12-07 MED ORDER — HEPARIN SOD (PORK) LOCK FLUSH 100 UNIT/ML IV SOLN
250.0000 [IU] | Freq: Once | INTRAVENOUS | Status: AC | PRN
  Administered 2018-12-07: 250 [IU]
  Filled 2018-12-07: qty 5

## 2018-12-07 MED ORDER — SODIUM CHLORIDE 0.9% FLUSH
10.0000 mL | Freq: Once | INTRAVENOUS | Status: AC
  Administered 2018-12-07: 10 mL
  Filled 2018-12-07: qty 10

## 2018-12-08 LAB — CA 125: Cancer Antigen (CA) 125: 7.9 U/mL (ref 0.0–38.1)

## 2018-12-08 LAB — CANCER ANTIGEN 19-9: CA 19-9: 4 U/mL (ref 0–35)

## 2018-12-12 ENCOUNTER — Other Ambulatory Visit: Payer: Self-pay | Admitting: Rheumatology

## 2018-12-12 DIAGNOSIS — Z96652 Presence of left artificial knee joint: Secondary | ICD-10-CM | POA: Diagnosis not present

## 2018-12-12 DIAGNOSIS — M25662 Stiffness of left knee, not elsewhere classified: Secondary | ICD-10-CM | POA: Diagnosis not present

## 2018-12-12 DIAGNOSIS — R262 Difficulty in walking, not elsewhere classified: Secondary | ICD-10-CM | POA: Diagnosis not present

## 2018-12-12 NOTE — Telephone Encounter (Signed)
Last Visit: 10/16/18 Next Visit: 03/20/19  Okay to refill per Dr. Estanislado Pandy

## 2018-12-14 DIAGNOSIS — M25662 Stiffness of left knee, not elsewhere classified: Secondary | ICD-10-CM | POA: Diagnosis not present

## 2018-12-14 DIAGNOSIS — Z96652 Presence of left artificial knee joint: Secondary | ICD-10-CM | POA: Diagnosis not present

## 2018-12-14 DIAGNOSIS — M25562 Pain in left knee: Secondary | ICD-10-CM | POA: Diagnosis not present

## 2018-12-14 DIAGNOSIS — R262 Difficulty in walking, not elsewhere classified: Secondary | ICD-10-CM | POA: Diagnosis not present

## 2018-12-19 DIAGNOSIS — Z96652 Presence of left artificial knee joint: Secondary | ICD-10-CM | POA: Diagnosis not present

## 2018-12-19 DIAGNOSIS — M25562 Pain in left knee: Secondary | ICD-10-CM | POA: Diagnosis not present

## 2018-12-19 DIAGNOSIS — M25662 Stiffness of left knee, not elsewhere classified: Secondary | ICD-10-CM | POA: Diagnosis not present

## 2018-12-19 DIAGNOSIS — R262 Difficulty in walking, not elsewhere classified: Secondary | ICD-10-CM | POA: Diagnosis not present

## 2018-12-22 ENCOUNTER — Other Ambulatory Visit: Payer: Self-pay | Admitting: Hematology and Oncology

## 2018-12-22 MED ORDER — TRAMADOL HCL 50 MG PO TABS: 50.0000 mg | ORAL_TABLET | Freq: Four times a day (QID) | ORAL | 0 refills | Status: DC | PRN

## 2018-12-26 ENCOUNTER — Telehealth: Payer: Self-pay

## 2018-12-26 DIAGNOSIS — R29898 Other symptoms and signs involving the musculoskeletal system: Secondary | ICD-10-CM | POA: Diagnosis not present

## 2018-12-26 DIAGNOSIS — Z96652 Presence of left artificial knee joint: Secondary | ICD-10-CM | POA: Diagnosis not present

## 2018-12-26 DIAGNOSIS — M25662 Stiffness of left knee, not elsewhere classified: Secondary | ICD-10-CM | POA: Diagnosis not present

## 2018-12-26 DIAGNOSIS — R262 Difficulty in walking, not elsewhere classified: Secondary | ICD-10-CM | POA: Diagnosis not present

## 2018-12-26 NOTE — Telephone Encounter (Signed)
She called and left a message to call her.  Called back. She is checking on Tramadol Rx. Told her Rx sent on 10/30. She verbalized understanding.

## 2018-12-27 ENCOUNTER — Other Ambulatory Visit: Payer: Self-pay | Admitting: Hematology and Oncology

## 2018-12-28 ENCOUNTER — Other Ambulatory Visit: Payer: Self-pay | Admitting: Rheumatology

## 2018-12-28 ENCOUNTER — Telehealth: Payer: Self-pay

## 2018-12-28 DIAGNOSIS — M25662 Stiffness of left knee, not elsewhere classified: Secondary | ICD-10-CM | POA: Diagnosis not present

## 2018-12-28 DIAGNOSIS — R262 Difficulty in walking, not elsewhere classified: Secondary | ICD-10-CM | POA: Diagnosis not present

## 2018-12-28 DIAGNOSIS — Z96652 Presence of left artificial knee joint: Secondary | ICD-10-CM | POA: Diagnosis not present

## 2018-12-28 DIAGNOSIS — R29898 Other symptoms and signs involving the musculoskeletal system: Secondary | ICD-10-CM | POA: Diagnosis not present

## 2018-12-28 NOTE — Telephone Encounter (Signed)
-----   Message from Heath Lark, MD sent at 12/28/2018  8:00 AM EST ----- Regarding: citalopram medication refill Hi Hassan Rowan,  Can you call her back? I typically do not prescribe citalopram She needs to contact her PCP for refill I will decline pharmacy order request

## 2018-12-28 NOTE — Telephone Encounter (Signed)
Called and given below message. She verbalized understanding. 

## 2018-12-28 NOTE — Telephone Encounter (Signed)
Last Visit: 10/16/18 Next Visit: 03/20/19  Okay to refill per Dr. Estanislado Pandy

## 2018-12-29 DIAGNOSIS — F418 Other specified anxiety disorders: Secondary | ICD-10-CM | POA: Diagnosis not present

## 2019-01-01 ENCOUNTER — Other Ambulatory Visit: Payer: Self-pay | Admitting: Rheumatology

## 2019-01-02 ENCOUNTER — Other Ambulatory Visit: Payer: Self-pay | Admitting: Rheumatology

## 2019-01-02 DIAGNOSIS — J0100 Acute maxillary sinusitis, unspecified: Secondary | ICD-10-CM | POA: Diagnosis not present

## 2019-01-02 DIAGNOSIS — N3090 Cystitis, unspecified without hematuria: Secondary | ICD-10-CM | POA: Diagnosis not present

## 2019-01-02 DIAGNOSIS — N3001 Acute cystitis with hematuria: Secondary | ICD-10-CM | POA: Diagnosis not present

## 2019-01-04 ENCOUNTER — Other Ambulatory Visit: Payer: Self-pay | Admitting: Rheumatology

## 2019-01-04 DIAGNOSIS — R29898 Other symptoms and signs involving the musculoskeletal system: Secondary | ICD-10-CM | POA: Diagnosis not present

## 2019-01-04 DIAGNOSIS — M25662 Stiffness of left knee, not elsewhere classified: Secondary | ICD-10-CM | POA: Diagnosis not present

## 2019-01-04 DIAGNOSIS — R262 Difficulty in walking, not elsewhere classified: Secondary | ICD-10-CM | POA: Diagnosis not present

## 2019-01-04 DIAGNOSIS — M25562 Pain in left knee: Secondary | ICD-10-CM | POA: Diagnosis not present

## 2019-01-04 NOTE — Telephone Encounter (Signed)
Request rx for Otrexcip, and Orencia sent to Fax# [phone]. Ph# [phone].

## 2019-01-05 MED ORDER — METHOTREXATE (PF) 20 MG/0.4ML ~~LOC~~ SOAJ: 20.0000 mg | SUBCUTANEOUS | 0 refills | Status: DC

## 2019-01-05 MED ORDER — ORENCIA CLICKJECT 125 MG/ML ~~LOC~~ SOAJ: 125.0000 mg | SUBCUTANEOUS | 0 refills | Status: DC

## 2019-01-05 NOTE — Telephone Encounter (Signed)
Last Visit: 10/16/2018 Next Visit: 03/20/2019 Labs: 12/07/2018 glucose 115, albumin 3.0, total bilirubin <0.2, WBC 12.4, RBC 3.64, hemoglobin 9.7, HCT 31.4, RDW 20.4, platelets 888, lymphs abs 4.3, abs immature granulocytes 0.16.  TB Gold: 06/27/2018 negative   Okay to refill orencia and otrexup?

## 2019-01-05 NOTE — Telephone Encounter (Signed)
Ok to refill 

## 2019-01-09 DIAGNOSIS — M25662 Stiffness of left knee, not elsewhere classified: Secondary | ICD-10-CM | POA: Diagnosis not present

## 2019-01-09 DIAGNOSIS — R29898 Other symptoms and signs involving the musculoskeletal system: Secondary | ICD-10-CM | POA: Diagnosis not present

## 2019-01-09 DIAGNOSIS — R262 Difficulty in walking, not elsewhere classified: Secondary | ICD-10-CM | POA: Diagnosis not present

## 2019-01-09 DIAGNOSIS — M25562 Pain in left knee: Secondary | ICD-10-CM | POA: Diagnosis not present

## 2019-01-11 ENCOUNTER — Telehealth: Payer: Self-pay | Admitting: Rheumatology

## 2019-01-11 NOTE — Telephone Encounter (Signed)
Submitted a Prior Authorization request to [employer] for [employer] via Cover My Meds.    Submitted a Prior Authorization request to [employer] for [place] via Cover My Meds. Will update once we receive a response.  Will update when we receive a response.   Mariella Saa, PharmD, [place], [place] Clinical Specialty Pharmacist [phone]  01/11/2019 9:48 AM

## 2019-01-11 NOTE — Telephone Encounter (Signed)
[facility] left a voicemail advising Dr. Estanislado Pandy that they faxed Covermymeds for patient's prescription of Otrexup and Orencia to [phone].  If you have any questions, please call back at [phone]

## 2019-01-11 NOTE — Telephone Encounter (Signed)
Received notification from [facility] regarding a prior authorization for [facility]. Authorization has been APPROVED from 01/11/2019 to 01/11/2019.   Will send document to scan center.  Authorization # [id number]   Will update when we receive a response for Otrexup.   Mariella Saa, PharmD, [place], [place] Clinical Specialty Pharmacist [phone]  01/11/2019 10:04 AM

## 2019-01-11 NOTE — Telephone Encounter (Signed)
Received notification from [facility] regarding a prior authorization for [facility]. Authorization has been APPROVED from 01/11/19 to 01/11/20.   Will send document to scan center.  Authorization # [id number]

## 2019-01-15 NOTE — Telephone Encounter (Signed)
Received notification from [facility] regarding a prior authorization for OTREXUP. Authorization has been APPROVED from 01/14/2019 to 01/15/2020.   Will send document to scan center.  Authorization # [id number]   Mariella Saa, PharmD, [place], [place] Clinical Specialty Pharmacist [phone]  01/15/2019 8:36 AM

## 2019-01-16 ENCOUNTER — Telehealth: Payer: Self-pay

## 2019-01-16 ENCOUNTER — Other Ambulatory Visit: Payer: Self-pay | Admitting: Hematology and Oncology

## 2019-01-16 DIAGNOSIS — R262 Difficulty in walking, not elsewhere classified: Secondary | ICD-10-CM | POA: Diagnosis not present

## 2019-01-16 DIAGNOSIS — Z96652 Presence of left artificial knee joint: Secondary | ICD-10-CM | POA: Diagnosis not present

## 2019-01-16 DIAGNOSIS — M25662 Stiffness of left knee, not elsewhere classified: Secondary | ICD-10-CM | POA: Diagnosis not present

## 2019-01-16 DIAGNOSIS — R29898 Other symptoms and signs involving the musculoskeletal system: Secondary | ICD-10-CM | POA: Diagnosis not present

## 2019-01-16 MED ORDER — TRAMADOL HCL 50 MG PO TABS: 50.0000 mg | ORAL_TABLET | Freq: Four times a day (QID) | ORAL | 0 refills | Status: DC | PRN

## 2019-01-16 NOTE — Telephone Encounter (Signed)
Called and told her Rx sent to pharmacy. She verbalized understanding. 

## 2019-01-16 NOTE — Telephone Encounter (Signed)
-----   Message from Heath Lark, MD sent at 01/16/2019 10:03 AM EST ----- Regarding: pls call her and ask if she prefers e-visit next Tuesday. If she has abdominal pain then I should see her in person

## 2019-01-16 NOTE — Telephone Encounter (Signed)
Called and given below message. Denies abdominal pain at this time. She would like a e-visit. She is feeling good. A couple of weeks ago she had some pancreas pain and saw Dr. Carlis Abbott at [facility]. Her labs looked good and she was told to eat a low fat diet. Her pain eventually went away. She thought that she was maybe having a pancreatitis flare up.  She is requesting tramadol Rx and [facility]  Scheduling message sent to change appt to e-visit.

## 2019-01-16 NOTE — Telephone Encounter (Signed)
Refill of tramadol sent

## 2019-01-22 ENCOUNTER — Other Ambulatory Visit: Payer: Self-pay

## 2019-01-22 ENCOUNTER — Inpatient Hospital Stay: Payer: BC Managed Care – PPO | Attending: Hematology and Oncology

## 2019-01-22 ENCOUNTER — Inpatient Hospital Stay: Payer: BC Managed Care – PPO

## 2019-01-22 ENCOUNTER — Ambulatory Visit (HOSPITAL_COMMUNITY)
Admission: RE | Admit: 2019-01-22 | Discharge: 2019-01-22 | Disposition: A | Discharge status: Home / Self Care | Payer: BC Managed Care – PPO | Source: Ambulatory Visit | Attending: Hematology and Oncology | Admitting: Hematology and Oncology

## 2019-01-22 DIAGNOSIS — C252 Malignant neoplasm of tail of pancreas: Secondary | ICD-10-CM

## 2019-01-22 DIAGNOSIS — C541 Malignant neoplasm of endometrium: Secondary | ICD-10-CM

## 2019-01-22 DIAGNOSIS — C7961 Secondary malignant neoplasm of right ovary: Secondary | ICD-10-CM

## 2019-01-22 DIAGNOSIS — C259 Malignant neoplasm of pancreas, unspecified: Secondary | ICD-10-CM | POA: Diagnosis not present

## 2019-01-22 LAB — COMPREHENSIVE METABOLIC PANEL
ALT: 23 U/L (ref 0–44)
AST: 22 U/L (ref 15–41)
Albumin: 3.7 g/dL (ref 3.5–5.0)
Alkaline Phosphatase: 66 U/L (ref 38–126)
Anion gap: 12 (ref 5–15)
BUN: 14 mg/dL (ref 6–20)
CO2: 24 mmol/L (ref 22–32)
Calcium: 9.2 mg/dL (ref 8.9–10.3)
Chloride: 104 mmol/L (ref 98–111)
Creatinine, Ser: 0.65 mg/dL (ref 0.44–1.00)
GFR calc Af Amer: >60 mL/min (ref >60)
GFR calc non Af Amer: >60 mL/min (ref >60)
Glucose, Bld: 94 mg/dL (ref 70–99)
Potassium: 4.2 mmol/L (ref 3.5–5.1)
Sodium: 140 mmol/L (ref 135–145)
Total Bilirubin: <0.2 mg/dL — ABNORMAL LOW (ref 0.3–1.2)
Total Protein: 7.2 g/dL (ref 6.5–8.1)

## 2019-01-22 LAB — CBC WITH DIFFERENTIAL/PLATELET
Abs Immature Granulocytes: 0.08 10*3/uL — ABNORMAL HIGH (ref 0.00–0.07)
Basophils Absolute: 0.1 10*3/uL (ref 0.0–0.1)
Basophils Relative: 1 %
Eosinophils Absolute: 0.2 10*3/uL (ref 0.0–0.5)
Eosinophils Relative: 1 %
HCT: 35.6 % — ABNORMAL LOW (ref 36.0–46.0)
Hemoglobin: 11.1 g/dL — ABNORMAL LOW (ref 12.0–15.0)
Immature Granulocytes: 1 %
Lymphocytes Relative: 12 %
Lymphs Abs: 2.1 10*3/uL (ref 0.7–4.0)
MCH: 27.2 pg (ref 26.0–34.0)
MCHC: 31.2 g/dL (ref 30.0–36.0)
MCV: 87.3 fL (ref 80.0–100.0)
Monocytes Absolute: 1.5 10*3/uL — ABNORMAL HIGH (ref 0.1–1.0)
Monocytes Relative: 9 %
Neutro Abs: 13.3 10*3/uL — ABNORMAL HIGH (ref 1.7–7.7)
Neutrophils Relative %: 76 %
Platelets: 481 10*3/uL — ABNORMAL HIGH (ref 150–400)
RBC: 4.08 MIL/uL (ref 3.87–5.11)
RDW: 19.9 % — ABNORMAL HIGH (ref 11.5–15.5)
WBC: 17.2 10*3/uL — ABNORMAL HIGH (ref 4.0–10.5)
nRBC: 0 % (ref 0.0–0.2)

## 2019-01-22 MED ORDER — SODIUM CHLORIDE (PF) 0.9 % IJ SOLN
INTRAMUSCULAR | Status: AC
  Filled 2019-01-22: qty 50

## 2019-01-22 MED ORDER — HEPARIN SOD (PORK) LOCK FLUSH 100 UNIT/ML IV SOLN
INTRAVENOUS | Status: AC
  Filled 2019-01-22: qty 5

## 2019-01-22 MED ORDER — HEPARIN SOD (PORK) LOCK FLUSH 100 UNIT/ML IV SOLN
500.0000 [IU] | Freq: Once | INTRAVENOUS | Status: AC
  Administered 2019-01-22: 500 [IU] via INTRAVENOUS

## 2019-01-22 MED ORDER — SODIUM CHLORIDE 0.9% FLUSH
10.0000 mL | Freq: Once | INTRAVENOUS | Status: AC
  Administered 2019-01-22: 10 mL
  Filled 2019-01-22: qty 10

## 2019-01-22 MED ORDER — IOHEXOL 300 MG/ML  SOLN
100.0000 mL | Freq: Once | INTRAMUSCULAR | Status: AC | PRN
  Administered 2019-01-22: 100 mL via INTRAVENOUS

## 2019-01-23 ENCOUNTER — Encounter: Payer: Self-pay | Admitting: Hematology and Oncology

## 2019-01-23 ENCOUNTER — Inpatient Hospital Stay: Payer: BC Managed Care – PPO | Attending: Hematology and Oncology | Admitting: Hematology and Oncology

## 2019-01-23 DIAGNOSIS — M25562 Pain in left knee: Secondary | ICD-10-CM | POA: Diagnosis not present

## 2019-01-23 DIAGNOSIS — C252 Malignant neoplasm of tail of pancreas: Secondary | ICD-10-CM | POA: Diagnosis not present

## 2019-01-23 DIAGNOSIS — R262 Difficulty in walking, not elsewhere classified: Secondary | ICD-10-CM | POA: Diagnosis not present

## 2019-01-23 DIAGNOSIS — G620 Drug-induced polyneuropathy: Secondary | ICD-10-CM

## 2019-01-23 DIAGNOSIS — Z1509 Genetic susceptibility to other malignant neoplasm: Secondary | ICD-10-CM

## 2019-01-23 DIAGNOSIS — C7961 Secondary malignant neoplasm of right ovary: Secondary | ICD-10-CM | POA: Diagnosis not present

## 2019-01-23 DIAGNOSIS — M898X9 Other specified disorders of bone, unspecified site: Secondary | ICD-10-CM

## 2019-01-23 DIAGNOSIS — C541 Malignant neoplasm of endometrium: Secondary | ICD-10-CM

## 2019-01-23 DIAGNOSIS — R29898 Other symptoms and signs involving the musculoskeletal system: Secondary | ICD-10-CM | POA: Diagnosis not present

## 2019-01-23 DIAGNOSIS — T451X5A Adverse effect of antineoplastic and immunosuppressive drugs, initial encounter: Secondary | ICD-10-CM

## 2019-01-23 DIAGNOSIS — N951 Menopausal and female climacteric states: Secondary | ICD-10-CM

## 2019-01-23 DIAGNOSIS — M25662 Stiffness of left knee, not elsewhere classified: Secondary | ICD-10-CM | POA: Diagnosis not present

## 2019-01-23 LAB — CANCER ANTIGEN 19-9: CA 19-9: 5 U/mL (ref 0–35)

## 2019-01-23 MED ORDER — ESTRADIOL 0.1 MG/GM VA CREA: 1.0000 | TOPICAL_CREAM | VAGINAL | 12 refills | Status: DC

## 2019-01-23 MED ORDER — GABAPENTIN 300 MG PO CAPS: 300.0000 mg | ORAL_CAPSULE | Freq: Two times a day (BID) | ORAL | Status: DC

## 2019-01-23 NOTE — Progress Notes (Signed)
HEMATOLOGY-ONCOLOGY ELECTRONIC VISIT PROGRESS NOTE  Patient Care Team: [name], MD as PCP - General (Family Medicine)  I connected with by [facility] video conference and verified that I am speaking with the correct person using two identifiers.  I discussed the limitations, risks, security and privacy concerns of performing an evaluation and management service by [facility] and the availability of in person appointments.  I also discussed with the patient that there may be a patient responsible charge related to this service. The patient expressed understanding and agreed to proceed.   ASSESSMENT & PLAN:  Endometrial cancer [facility]) CT imaging show no evidence of endometrial cancer.  Continue follow-up  Secondary malignant neoplasm of right ovary [facility]) CT imaging show no evidence of recurrence.  Recent CA-125 is within normal limits Continue surveillance  Pancreatic cancer [facility]) Tumor marker is within normal limits CT imaging show no evidence of recurrence We will continue surveillance imaging in 3 months  Lynch syndrome She is up-to-date with surveillance imaging and recent colonoscopy in September showed no evidence of recurrence of colon cancer  Bone pain She is still recovering from recent knee surgery She is attempting to wean herself off tramadol  Peripheral neuropathy due to chemotherapy [facility]) Her neuropathy is stable She will continue on gabapentin  Vaginal dryness, menopausal I recommend a trial of vaginal Estrace cream and she is willing to proceed   Orders Placed This Encounter  Procedures  . CT Abdomen Pelvis W Contrast    Standing Status:   Future    Standing Expiration Date:   01/23/2020    Order Specific Question:   If indicated for the ordered procedure, I authorize the administration of contrast media per Radiology protocol    Answer:   Yes    Order Specific Question:   Preferred imaging location?    Answer:   [facility]    Order Specific Question:    Radiology Contrast Protocol - do NOT remove file path    Answer:   _0 [email or web address]    Order Specific Question:   Is patient pregnant?    Answer:   No    INTERVAL HISTORY: Please see below for problem oriented charting. The appointment was made to follow-up on history of Lynch syndrome, with prior history of colon cancer, pancreatic cancer, endometrial cancer and ovarian cancer She is recovering from recent knee surgery She had knee surgery on October 5 on the left She is still undergoing physical therapy for that debility Her pain is better although she is still requiring Tylenol and tramadol to take as needed She underwent colonoscopy on 9/22 which is reviewed and showed no evidence of cancer recurrence She had one episode of severe abdominal pain which was thought to be due to pancreatitis although she did not seek additional evaluation at the time She took some pain medicine and her pain went away She has been prescribed pancreatic enzymes She complained of some urinary frequency with recurrent UTI She is attempting to eat healthy and have lost some weight since last time I saw her.  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 Mixed attenuation mass emanates from the right adnexa measuring 10.8 x 8.0 cm very suspicious for right ovarian carcinoma. 2. Abnormality of the tail of the pancreas may  be due to mild pancreatitis and small pseudocyst formation, but neoplasm cannot be excluded. 3. Small amount of ascites within abdomen and pelvis. 4. Small nonobstructing renal calculi bilaterally.    08/30/2017 Pathology Results   1. Uterus and cervix, with left fallopian tube - ENDOMETRIOID ADENOCARCINOMA, FIGO GRADE I,  ARISING IN A BACKGROUND OF DIFFUSE COMPLEX ATYPICAL HYPERPLASIA. - CARCINOMA INVADES FOR OF DEPTH OF 0.2 CM WHERE THICKNESS OF MYOMETRIAL WALL IS 2.1 CM. - ALL RESECTION MARGINS ARE NEGATIVE FOR CARCINOMA. - NEGATIVE FOR LYMPHOVASCULAR OR PERINEURAL INVASION. - CERVICAL STROMA IS NOT INVOLVED. - SEE ONCOLOGY TABLE. - SEE NOTE 2. Ovary and fallopian tube, right - PRIMARY OVARIAN ENDOMETRIOID ADENOCARCINOMA, FIGO GRADE II, 12 CM. - THE OVARIAN SURFACE IS FOCALLY INVOLVED BY CARCINOMA. - NEGATIVE FOR LYMPHOVASCULAR INVASION. - BENIGN UNREMARKABLE FALLOPIAN TUBE, NEGATIVE FOR CARCINOMA. - SEE ONCOLOGY TABLE. - SEE NOTE 3. Cul-de-sac biopsy - METASTATIC ADENOCARCINOMA, MOST CONSISTENT WITH PRIMARY OVARIAN ENDOMETRIOID ADENOCARCINOMA. 4. Ovary, left - BENIGN UNREMARKABLE OVARY, NEGATIVE FOR MALIGNANCY. Microscopic Comment 1. UTERUS, CARCINOMA OR CARCINOSARCOMA Procedure: Total hysterectomy with bilateral salpingo-oophorectomy. Histologic type: Endometrioid adenocarcinoma. Histologic Grade: FIGO Grade I Myometrial invasion: Depth of invasion: 2 mm Myometrial thickness: 21 mm Uterine Serosa Involvement: Not identified Cervical stromal involvement: Not identified Extent of involvement of other organs: Not applicable Lymphovascular invasion: Not identified Regional Lymph Nodes: Examined: 0 Sentinel 0 Non-sentinel 0 Total Tumor block for ancillary studies: 1H MMR / MSI testing: Pending Pathologic Stage Classification (pTNM, AJCC 8th edition): pT1a, pNX (v4.1.0.0) 2. OVARY or FALLOPIAN TUBE or PRIMARY PERITONEUM: Procedure: Salpingo-oophorectomy Specimen Integrity: Intact Tumor Site: Right ovary Ovarian Surface Involvement (required only if applicable): Focally involved by carcinoma Fallopian Tube Surface Involvement (required only if applicable): Not identified Tumor Size: 12 cm Histologic Type: Endometrioid adenocarcinoma Histologic Grade: Grade II Implants (required for advanced  stage serous/seromucinous borderline tumors only): Not applicable Other Tissue/ Organ Involvement: Cul de sac biopsy involved by tumor Largest Extrapelvic Peritoneal Focus (required only if applicable): Not applicable Peritoneal/Ascitic Fluid: Negative for carcinoma (case # [id number]) Treatment Effect (required only for high-grade serous carcinomas): Not applicable Regional Lymph Nodes: No lymph nodes submitted or found Number of Lymph Nodes Examined: 0 Pathologic Stage Classification (pTNM, AJCC 8th Edition): pT2b, pN0 Representative Tumor Block: 2B and 2E 1. Molecular study for microsatellite instability and immunohistochemical stains for MMR-related proteins are pending and will be reported in an addendum. 2. Immunohistochemical stain show that the ovarian tumor is positive for CK7 and PAX8 (both diffuse), CDX2 (patchy and weak); and negative for CK20. This immunoprofile is consistent with the above diagnosis. Dr. Lyndon Code has reviewed this case and concurs with the above diagnosis. Molecular study for microsatellite instability and immunohistochemical stains for MMR-related proteins are pending and will be reported in an addendum   08/30/2017 Genetic Testing   Patient has genetic testing done for MSI  Results revealed patient has the following mutation(s): loss of [facility] 2   08/30/2017 Surgery   Surgeon: Mart Piggs, MD Pre-operative Diagnosis:  1. Adnexal mass 2. Abnormal uterine bleeding 3. H/o Cecal CA  Post-operative Diagnosis:  1. Adhesive disease post colon resection 2. Endometrial cancer NOS 3. Adenocarcinoma unknown origin, right ovary, suspicious for GI primary  Operation:  1. Lysis of adhesions ~30 minutes 2. Robotic-assisted laparoscopic total hysterectomy with right salpingo-oophorectomy and left salpingectomy 3. Left oophorectomy (RA-laparoscopic) 4. Pelvic washings  Findings: Adhesions of omentum to anterior abdominal wall. Enlarged cystic right ovary ~10cm.  Uterus had small nodules on serosa  near where right adnexa was intimate with the surface. No obvious intraoperative rupture of cyst, although in 2 areas the wall was thin and one of these areas had some bleeding. Slight scarring of left bladder dome to LUS/cervix. Uterus on frozen section c/w hyperplasia and a small focus of endometrial CA - no myo invasion, <2cm in size. Frozen section on the right adnexa was carcinoma, met from colon or possibly Gyn, favor GI primary, defer to permanent. Left ovary was WNL.     09/15/2017 Cancer Staging   Staging form: Corpus Uteri - Carcinoma and Carcinosarcoma, AJCC 8th Edition - Pathologic: FIGO Stage IA (pT1a, pN0, cM0) - Signed by Heath Lark, MD on 09/15/2017   09/26/2017 Procedure   Successful placement of a right internal jugular approach power injectable Port-A-Cath. The catheter is ready for immediate use.   11/07/2017 Imaging   1. Since 08/18/2017, similar to slight decrease in size of a pancreatic body/tail junction lesion. Cross modality comparison relative to 09/16/2017 MRI is also grossly similar. 2. No evidence of metastatic disease. 3. Aortic Atherosclerosis (ICD10-I70.0).  4. Left nephrolithiasis.   11/18/2017 Genetic Testing   MSH2 c.2237dupT pathogenic mutation identified in the CancerNext panel.  The CancerNext gene panel offered by [employer] includes sequencing and rearrangement analysis for the following 34 genes:   APC, ATM, BARD1, BMPR1A, BRCA1, BRCA2, BRIP1, CDH1, CDK4, CDKN2A, CHEK2, DICER1, HOXB13, EPCAM, GREM1, MLH1, MRE11A, MSH2, MSH6, MUTYH, NBN, NF1, PALB2, PMS2, POLD1, POLE, PTEN, RAD50, RAD51C, RAD51D, SMAD4, SMARCA4, STK11, and TP53.  The report date is November 18, 2017.  MSH2 [id number] pathogenic mutation identified on somatic testing.  These results are consistent with a diagnosis of Lynch syndrome.   Secondary malignant neoplasm of right ovary ([place])  08/23/2017 Tumor Marker   Patient's tumor was tested for  the following markers: CA-125 Results of the tumor marker test revealed 139.8   08/31/2017 Initial Diagnosis   Secondary malignant neoplasm of right ovary ([place])   09/15/2017 Cancer Staging   Staging form: Ovary, Fallopian Tube, and Primary Peritoneal Carcinoma, AJCC 8th Edition - Pathologic: Stage IIB (pT2b, pN0, cM0) - Signed by Heath Lark, MD on 09/15/2017   09/27/2017 Imaging   No evidence of metastatic disease or other acute findings within the thorax.  4 cm low-attenuation mass in pancreatic tail, highly suspicious for pancreatic carcinoma. This is caused splenic vein thrombosis, with new venous collaterals in the left upper quadrant. Consider endoscopic ultrasound with FNA for tissue diagnosis.  Stable benign hepatic hemangioma.    09/27/2017 Tumor Marker   Patient's tumor was tested for the following markers: CA-125 Results of the tumor marker test revealed 39.4   11/02/2017 Tumor Marker   Patient's tumor was tested for the following markers: CA-125 Results of the tumor marker test revealed 19   11/07/2017 Tumor Marker   Patient's tumor was tested for the following markers: CA-125 Results of the tumor marker test revealed 18.3   11/18/2017 Genetic Testing   MSH2 c.2237dupT pathogenic mutation identified in the CancerNext panel.  The CancerNext gene panel offered by [employer] includes sequencing and rearrangement analysis for the following 34 genes:   APC, ATM, BARD1, BMPR1A, BRCA1, BRCA2, BRIP1, CDH1, CDK4, CDKN2A, CHEK2, DICER1, HOXB13, EPCAM, GREM1, MLH1, MRE11A, MSH2, MSH6, MUTYH, NBN, NF1, PALB2, PMS2, POLD1, POLE, PTEN, RAD50, RAD51C, RAD51D, SMAD4, SMARCA4, STK11, and TP53.  The report date is November 18, 2017.  MSH2 [id number] pathogenic mutation identified on somatic testing.  These results are consistent with a diagnosis of  Lynch syndrome.   12/15/2017 Tumor Marker   Patient's tumor was tested for the following markers: CA-125 Results of the tumor  marker test revealed 57.3   01/16/2018 Tumor Marker   Patient's tumor was tested for the following markers: CA-125 Results of the tumor marker test revealed 24.2   04/10/2018 Tumor Marker   Patient's tumor was tested for the following markers: CA-125 Results of the tumor marker test revealed 18.2   07/12/2018 Tumor Marker   Patient's tumor was tested for the following markers: CA-125 Results of the tumor marker test revealed 11.8   10/16/2018 Tumor Marker   Patient's tumor was tested for the following markers: CA125 Results of the tumor marker test revealed 12.4.   Pancreatic cancer ([place])  10/13/2017 Pathology Results   Pancreas tail mass, endoscopic ultrasound-guided, fine needle aspiration II (smears and cell block): Adenocarcinoma   11/18/2017 Genetic Testing   MSH2 c.2237dupT pathogenic mutation identified in the CancerNext panel.  The CancerNext gene panel offered by [employer] includes sequencing and rearrangement analysis for the following 34 genes:   APC, ATM, BARD1, BMPR1A, BRCA1, BRCA2, BRIP1, CDH1, CDK4, CDKN2A, CHEK2, DICER1, HOXB13, EPCAM, GREM1, MLH1, MRE11A, MSH2, MSH6, MUTYH, NBN, NF1, PALB2, PMS2, POLD1, POLE, PTEN, RAD50, RAD51C, RAD51D, SMAD4, SMARCA4, STK11, and TP53.  The report date is November 18, 2017.  MSH2 [id number] pathogenic mutation identified on somatic testing.  These results are consistent with a diagnosis of Lynch syndrome.   11/24/2017 Pathology Results   A. "TAIL OF PANCREAS AND SPLEEN", DISTAL PANCREATECTOMY AND SPLENECTOMY: Invasive ductal adenocarcinoma, moderately to poorly differentiated with focal signet ring cell features, of pancreas (distal).   The carcinoma is 2.5 cm in greatest dimension grossly.   Treatment effects present in the form of fibrosis (50%). No lymphovascular or definite perineural invasion identified. All surgical margins are negative for tumor or high-grade dysplasia.  Adjacent mucinous neoplasm, most consistent with Intraductal papillary mucinous neoplasm (IPMN) with low-grade dysplasia.   Uninvolved pancreas show atrophy and focal acute inflammation.   Twelve benign lymph nodes (0/12). Spleen with no significant histopathologic abnormalities.  PROCEDURE: distal pancreatectomy and splenectomy TUMOR SITE: distal pancreas TUMOR SIZE:  GREATEST DIMENSION: 2.5 cm  ADDITIONAL DIMENSIONS:  x  cm HISTOLOGIC TYPE: ductal adenocarcinoma HISTOLOGIC GRADE: grade 3 TUMOR EXTENSION: peripancreatic soft tissue MARGINS: negative for tumor TREATMENT EFFECT: treatment effects present in the form of fibrosis (50%). LYMPHOVASCULAR INVASION: not identified PERINEURAL INVASION: no definite evidence REGIONAL LYMPH NODES:   NUMBER OF LYMPH NODES INVOLVED: 0   NUMBER OF LYMPH NODES EXAMINED: 12 PATHOLOGIC STAGE CLASSIFICATION (pTNM, AJCC 8th Ed): ypT2, ypN0 DISTANT METASTASIS (pM): pMx ADDITIONAL PATHOLOGIC FINDINGS: mucinous neoplasm, most consistent with intraductal papillary mucinous neoplasm (IPMN) with low-grade dysplasia, is identified adjacent to the main tumor.   11/24/2017 Cancer Staging   Staging form: Exocrine Pancreas, AJCC 8th Edition - Pathologic stage from 11/24/2017: Stage IB (pT2, pN0, cM0) - Signed by Truitt Merle, MD on 01/03/2018   11/25/2017 Surgery   She had surgery at [facility] 1. Exploratory Laparotomy 2. Distal Pancreatectomy and Splenectomy 3. Intraoperative Ultrasound 4. Open Cholecystectomy    12/15/2017 Cancer Staging   Staging form: Exocrine Pancreas, AJCC 8th Edition - Clinical: Stage IB (cT2, cN0, cM0) - Signed by Heath Lark, MD on 12/15/2017   12/28/2017 Imaging   12/28/2017 CT Abdomen  IMPRESSION: 1. Postoperative findings from recent partial pancreatectomy including a 21 cubic cm fluid collection along the pancreatic resection margin which could represent early pseudocyst. 2. Nodularity along  the lateral limb of the left adrenal gland could also be postoperative but merit surveillance, as the pancreatic lesion was in close proximity to this adrenal gland on the prior CT of 11/07/2017. 3. Asymmetric fullness inferiorly in the left breast. The patient has a history of prior left breast procedures, correlation with mammography is recommended. 4. Other imaging findings of potential clinical significance: Stable hemangioma in the left hepatic lobe. Splenectomy. Aortic Atherosclerosis (ICD10-I70.0). Right hemicolectomy. Small focus of fat necrosis in the right anterior abdominal wall subcutaneous tissues near the laparotomy site. Bilateral nonobstructive nephrolithiasis.   01/02/2018 Tumor Marker   Patient's tumor was tested for the following markers: CA-19-9 Results of the tumor marker test revealed 5   01/03/2018 - 06/05/2018 Chemotherapy   She received modified dose FOLFIRINOX   04/10/2018 Imaging   1. Distal pancreatectomy, without findings of recurrent or metastatic disease. 2. Incompletely imaged hypoenhancement within lower lobe right pulmonary artery branch is likely chronic (but interval since 10/09/2017) pulmonary embolism. Dedicated CTA could further evaluate. 3. Decrease in size of a peripancreatic complex fluid collection anteriorly, likely a resolving pseudocyst. 4. Decreased size of minimal fluid versus a borderline sized node in the gastrohepatic ligament. Recommend attention on follow-up. 5. Hepatic steatosis with a segment 4 hemangioma. 6. Aortic Atherosclerosis (ICD10-I70.0). This is significantly age advanced.   07/12/2018 Tumor Marker   Patient's tumor was tested for the following markers: CA-19-9 Results of the tumor marker test revealed 6   07/12/2018 Imaging   1. Status post distal pancreatectomy with stable postoperative fluid collection adjacent to the ventral aspect of the pancreatic head. No findings to suggest metastatic disease in the abdomen or pelvis. 2.  Hepatic steatosis with small cavernous hemangioma in segment 4A of the liver. 3. Slight decreased size of nonenlarged gastrohepatic ligament lymph node, presumably benign. 4. Aortic atherosclerosis.    10/16/2018 Imaging   Status post distal pancreatectomy. Stable postoperative seroma along the surgical margin.   Status post hysterectomy and right oophorectomy.   Status post right hemicolectomy with appendectomy.   No evidence of recurrent or metastatic disease.   No colonic wall thickening or mass is evident on CT.   01/22/2019 Tumor Marker   Patient's tumor was tested for the following markers: CA-19.9 Results of the tumor marker test revealed 5   01/22/2019 Imaging   1. No evidence of local recurrence or metastatic disease status post distal pancreatectomy and splenectomy. 2. Stable small seroma anterior to the pancreatic head. 3. Postsurgical changes as described. 4. Stable additional incidental findings including a hepatic hemangioma, nonobstructing bilateral renal calculi and aortic Atherosclerosis (ICD10-I70.0).     REVIEW OF SYSTEMS:   Constitutional: Denies fevers, chills  Eyes: Denies blurriness of vision Ears, nose, mouth, throat, and face: Denies mucositis or sore throat Respiratory: Denies cough, dyspnea or wheezes Cardiovascular: Denies palpitation, chest discomfort Skin: Denies abnormal skin rashes Lymphatics: Denies new lymphadenopathy or easy bruising Neurological:Denies numbness, tingling or new weaknesses Behavioral/Psych: Mood is stable, no new changes  Extremities: No lower extremity edema All other systems were reviewed with the patient and are negative.  I have reviewed the past medical history, past surgical history, social history and family history with the patient and they are unchanged from previous note.  ALLERGIES:  is allergic to codeine; penicillins; and bactrim [sulfamethoxazole-trimethoprim].  MEDICATIONS:  Current Outpatient Medications   Medication Sig Dispense Refill  . fluticasone (FLONASE) 50 MCG/ACT nasal spray Place 1 spray into both nostrils daily.    . Abatacept (ORENCIA CLICKJECT)  125 MG/ML SOAJ Inject 125 mg into the skin every 7 (seven) days. 12 mL 0  . citalopram (CELEXA) 20 MG tablet Take 1 tablet (20 mg total) by mouth daily. 30 tablet 11  . [START ON 01/24/2019] estradiol (ESTRACE VAGINAL) 0.1 MG/GM vaginal cream Place 1 Applicatorful vaginally 3 (three) times a week. 38.3 g 12  . folic acid (FOLVITE) 1 MG tablet Take 2 tablets (2 mg total) by mouth daily. 180 tablet 3  . gabapentin (NEURONTIN) 300 MG capsule Take 1 capsule (300 mg total) by mouth 2 (two) times daily.    Marland Kitchen lidocaine-prilocaine (EMLA) cream Apply 1 application topically daily as needed (port). Apply to affected area once 30 g 11  . lipase/protease/amylase (CREON) 12000 units CPEP capsule Take 12,000 Units by mouth See admin instructions. Take 1 capsule before and after each meal. Take 1 capsule before each snack.    Marland Kitchen loratadine (CLARITIN) 10 MG tablet Take 10 mg by mouth daily after breakfast.    . metFORMIN (GLUCOPHAGE) 500 MG tablet TAKE 1 TABLET (500 MG TOTAL) BY MOUTH 2 (TWO) TIMES DAILY WITH A MEAL. (Patient taking differently: Take 500 mg by mouth 2 (two) times daily with a meal. ) 60 tablet 1  . methocarbamol (ROBAXIN) 500 MG tablet TAKE 1 TABLET BY MOUTH 3 TIMES DAILY AS NEEDED FOR MUSCLE SPASMS. 90 tablet 0  . Methotrexate, PF, 20 MG/0.4ML SOAJ Inject 20 mg into the skin once a week. 12 pen 0  . Multiple Vitamin (MULTIVITAMIN WITH MINERALS) TABS tablet Take 1 tablet by mouth daily.    Marland Kitchen omeprazole (PRILOSEC) 20 MG capsule Take 20 mg by mouth daily as needed (heartburn).     . predniSONE (DELTASONE) 5 MG tablet TAKE 1 TABLET BY MOUTH EVERY DAY WITH BREAKFAST 30 tablet 0  . simethicone (MYLICON) 291 MG chewable tablet Chew 125 mg by mouth every 6 (six) hours as needed for flatulence.    . traMADol (ULTRAM) 50 MG tablet Take 1 tablet (50 mg  total) by mouth every 6 (six) hours as needed for moderate pain. 60 tablet 0   No current facility-administered medications for this visit.     PHYSICAL EXAMINATION: ECOG PERFORMANCE STATUS: 1 - Symptomatic but completely ambulatory  LABORATORY DATA:  I have reviewed the data as listed CMP Latest Ref Rng & Units 01/22/2019 12/07/2018 11/28/2018  Glucose 70 - 99 mg/dL 94 115(H) 103(H)  BUN 6 - 20 mg/dL _0 Creatinine 0.44 - 1.00 mg/dL 0.65 0.69 0.59  Sodium 135 - 145 mmol/L 140 140 139  Potassium 3.5 - 5.1 mmol/L 4.2 3.7 3.5  Chloride 98 - 111 mmol/L 104 103 102  CO2 22 - 32 mmol/L _1 Calcium 8.9 - 10.3 mg/dL 9.2 9.2 8.5(L)  Total Protein 6.5 - 8.1 g/dL 7.2 7.1 -  Total Bilirubin 0.3 - 1.2 mg/dL <0.2(L) <0.2(L) -  Alkaline Phos 38 - 126 U/L 66 117 -  AST 15 - 41 U/L 22 16 -  ALT 0 - 44 U/L 23 34 -    Lab Results  Component Value Date   WBC 17.2 (H) 01/22/2019   HGB 11.1 (L) 01/22/2019   HCT 35.6 (L) 01/22/2019   MCV 87.3 01/22/2019   PLT 481 (H) 01/22/2019   NEUTROABS 13.3 (H) 01/22/2019     RADIOGRAPHIC STUDIES: I have reviewed imaging study with the patient I have personally reviewed the radiological images as listed and agreed with the findings in the report. Ct Abdomen  Pelvis W Contrast  Result Date: 01/22/2019 CLINICAL DATA:  Pancreatic cancer post completion of chemotherapy in March 2020. History uterine, ovarian and colon cancer. EXAM: CT ABDOMEN AND PELVIS WITH CONTRAST TECHNIQUE: Multidetector CT imaging of the abdomen and pelvis was performed using the standard protocol following bolus administration of intravenous contrast. CONTRAST:  167m OMNIPAQUE IOHEXOL 300 MG/ML  SOLN COMPARISON:  CT 10/16/2018 and 07/12/2018. FINDINGS: Lower chest: Clear lung bases. No significant pleural or pericardial effusion. Central venous catheter projects into the right atrium. Hepatobiliary: The hypervascular lesion anteriorly in the right hepatic lobe is unchanged from  previous studies, including an MRI done 09/16/2017, and consistent with a hemangioma. This measures 2.1 x 1.4 cm on image 23/2. No new or enlarging hepatic lesions. No significant biliary dilatation status post cholecystectomy. Pancreas: Status post distal pancreatectomy. The pancreatic head appears stable without ductal dilatation. Small fluid collection anterior to the pancreatic head measures 14 x 11 mm on image 51/2, similar to previous study. Spleen: Surgically absent. Adrenals/Urinary Tract: Both adrenal glands appear normal. There are probable small nonobstructing calculi in the lower poles of both kidneys. No evidence of ureteral calculus or hydronephrosis. There is no renal mass or perinephric soft tissue stranding. The bladder appears normal. Stomach/Bowel: No evidence of bowel wall thickening, distention or surrounding inflammatory change. Stable postsurgical changes from previous right hemicolectomy. Vascular/Lymphatic: There are no enlarged abdominal or pelvic lymph nodes. Small pelvic sidewall lymph nodes are stable. No acute vascular findings. Mild aortic and branch vessel atherosclerosis. Reproductive: Hysterectomy. No adnexal mass. Other: Stable postsurgical changes in the anterior abdominal wall with a midline ventral hernia containing fat. Stable focus of fat necrosis along the right aspect of the incision (image 60/2). Musculoskeletal: No acute or significant osseous findings. IMPRESSION: 1. No evidence of local recurrence or metastatic disease status post distal pancreatectomy and splenectomy. 2. Stable small seroma anterior to the pancreatic head. 3. Postsurgical changes as described. 4. Stable additional incidental findings including a hepatic hemangioma, nonobstructing bilateral renal calculi and aortic Atherosclerosis (ICD10-I70.0). Electronically Signed   By: WRichardean SaleM.D.   On: 01/22/2019 13:26    I discussed the assessment and treatment plan with the patient. The patient was  provided an opportunity to ask questions and all were answered. The patient agreed with the plan and demonstrated an understanding of the instructions. The patient was advised to call back or seek an in-person evaluation if the symptoms worsen or if the condition fails to improve as anticipated.    I spent 22 minutes counseling the patient face to face. The total time spent in the appointment was 30 minutes and more than 50% was on counseling and review of test results  NHeath Lark MD 01/23/2019 10:10 AM

## 2019-01-23 NOTE — Assessment & Plan Note (Signed)
I recommend a trial of vaginal Estrace cream and she is willing to proceed

## 2019-01-23 NOTE — Assessment & Plan Note (Signed)
CT imaging show no evidence of endometrial cancer.  Continue follow-up

## 2019-01-23 NOTE — Assessment & Plan Note (Signed)
CT imaging show no evidence of recurrence.  Recent CA-125 is within normal limits Continue surveillance

## 2019-01-23 NOTE — Assessment & Plan Note (Signed)
Tumor marker is within normal limits CT imaging show no evidence of recurrence We will continue surveillance imaging in 3 months

## 2019-01-23 NOTE — Assessment & Plan Note (Signed)
Her neuropathy is stable She will continue on gabapentin

## 2019-01-23 NOTE — Assessment & Plan Note (Signed)
She is up-to-date with surveillance imaging and recent colonoscopy in September showed no evidence of recurrence of colon cancer

## 2019-01-23 NOTE — Assessment & Plan Note (Signed)
She is still recovering from recent knee surgery She is attempting to wean herself off tramadol

## 2019-01-24 DIAGNOSIS — R0683 Snoring: Secondary | ICD-10-CM | POA: Diagnosis not present

## 2019-01-25 ENCOUNTER — Other Ambulatory Visit: Payer: Self-pay | Admitting: Rheumatology

## 2019-01-25 ENCOUNTER — Telehealth: Payer: Self-pay | Admitting: Hematology and Oncology

## 2019-01-25 NOTE — Telephone Encounter (Signed)
Left message re January/March. Also that 3/2 visit will be mychart video visit. Schedule mailed.

## 2019-01-26 NOTE — Telephone Encounter (Signed)
Last Visit: 10/16/2018 Next Visit: 03/20/2019  Okay to refill per Dr. Estanislado Pandy.

## 2019-01-27 DIAGNOSIS — Z20828 Contact with and (suspected) exposure to other viral communicable diseases: Secondary | ICD-10-CM | POA: Diagnosis not present

## 2019-01-27 DIAGNOSIS — J019 Acute sinusitis, unspecified: Secondary | ICD-10-CM | POA: Diagnosis not present

## 2019-02-03 DIAGNOSIS — G4710 Hypersomnia, unspecified: Secondary | ICD-10-CM | POA: Diagnosis not present

## 2019-02-06 ENCOUNTER — Telehealth: Payer: Self-pay | Admitting: Rheumatology

## 2019-02-06 ENCOUNTER — Other Ambulatory Visit: Payer: Self-pay | Admitting: Rheumatology

## 2019-02-06 MED ORDER — PREDNISONE 5 MG PO TABS: ORAL_TABLET | ORAL | 0 refills | Status: DC

## 2019-02-06 NOTE — Telephone Encounter (Signed)
[facility] called stating [facility]ills of Prednisone and Methocarbamol that Dr. Estanislado Pandy sent to the pharmacy on 01/26/19.  [facility] states [facility] birth[facility] system and she has been having trouble with refills ever since.  Patient is requesting the refills be sent again or called into the pharmacy.

## 2019-02-06 NOTE — Telephone Encounter (Signed)
The pharmacy had patients DOB listed as 02/23/1971. Spoke with the pharmacy and methocarbamol was picked up on 01/27/2019, prednisone was not received. Will resend prednisone. Pharmacy corrected DOB. Patient is aware.

## 2019-02-07 ENCOUNTER — Other Ambulatory Visit (HOSPITAL_BASED_OUTPATIENT_CLINIC_OR_DEPARTMENT_OTHER): Payer: Self-pay

## 2019-02-07 DIAGNOSIS — R0683 Snoring: Secondary | ICD-10-CM

## 2019-02-07 DIAGNOSIS — G4710 Hypersomnia, unspecified: Secondary | ICD-10-CM

## 2019-02-07 DIAGNOSIS — G4730 Sleep apnea, unspecified: Secondary | ICD-10-CM

## 2019-02-08 ENCOUNTER — Other Ambulatory Visit (HOSPITAL_COMMUNITY)
Admission: RE | Admit: 2019-02-08 | Discharge: 2019-02-08 | Disposition: A | Discharge status: Home / Self Care | Payer: BC Managed Care – PPO | Source: Ambulatory Visit | Attending: Internal Medicine | Admitting: Internal Medicine

## 2019-02-08 DIAGNOSIS — Z01812 Encounter for preprocedural laboratory examination: Secondary | ICD-10-CM | POA: Insufficient documentation

## 2019-02-08 DIAGNOSIS — Z20828 Contact with and (suspected) exposure to other viral communicable diseases: Secondary | ICD-10-CM | POA: Diagnosis not present

## 2019-02-08 LAB — SARS CORONAVIRUS 2 (TAT 6-24 HRS): SARS Coronavirus 2: NEGATIVE

## 2019-02-10 ENCOUNTER — Other Ambulatory Visit: Payer: Self-pay

## 2019-02-10 ENCOUNTER — Ambulatory Visit (HOSPITAL_BASED_OUTPATIENT_CLINIC_OR_DEPARTMENT_OTHER): Payer: BC Managed Care – PPO | Attending: Internal Medicine | Admitting: Internal Medicine

## 2019-02-10 DIAGNOSIS — G4730 Sleep apnea, unspecified: Secondary | ICD-10-CM

## 2019-02-10 DIAGNOSIS — R0681 Apnea, not elsewhere classified: Secondary | ICD-10-CM | POA: Insufficient documentation

## 2019-02-10 DIAGNOSIS — R0683 Snoring: Secondary | ICD-10-CM

## 2019-02-10 DIAGNOSIS — G4710 Hypersomnia, unspecified: Secondary | ICD-10-CM

## 2019-02-13 ENCOUNTER — Other Ambulatory Visit: Payer: Self-pay | Admitting: Rheumatology

## 2019-02-13 DIAGNOSIS — G4752 REM sleep behavior disorder: Secondary | ICD-10-CM | POA: Diagnosis not present

## 2019-02-13 NOTE — Telephone Encounter (Signed)
Last Visit: 10/16/2018 Next Visit: 03/20/2019  Okay to refill per Dr. Estanislado Pandy.

## 2019-02-13 NOTE — Procedures (Signed)
   NAME: Dana Hicks DATE OF BIRTH:  20-Apr-1975 MEDICAL RECORD NUMBER [id number]  LOCATION: [facility]  PHYSICIAN: Marius Ditch  DATE OF STUDY: 02/10/2019  SLEEP STUDY TYPE: Nocturnal Polysomnogram               REFERRING PHYSICIAN: Marius Ditch, MD  INDICATION FOR STUDY: witnessed apnea, excessive daytime sleepiness  EPWORTH SLEEPINESS SCORE:   HEIGHT: 5\' 2"  (157.5 cm)  WEIGHT: 191 lb (86.6 kg)    Body mass index is 34.93 kg/m.  NECK SIZE: 13 in.  MEDICATIONS Patient self administered medications include: N/A. Medications administered during study include No sleep medicine administered.Marland Kitchen  SLEEP STUDY TECHNIQUE A multi-channel overnight Polysomnography study was performed. The channels recorded and monitored were central and occipital EEG, electrooculogram (EOG), submentalis EMG (chin), nasal and oral airflow, thoracic and abdominal wall motion, anterior tibialis EMG, snore microphone, electrocardiogram, and a pulse oximetry.  TECHNICAL COMMENTS Comments added by Technician: Patient was restless all through the night. The patient reports changing positions frequently due to RA related pain. Comments added by Scorer: N/A  SLEEP ARCHITECTURE The study was initiated at 9:57:41 PM and terminated at 4:58:00 AM. The total recorded time was 420.3 minutes. EEG confirmed total sleep time was 360.3 minutes yielding a sleep efficiency of 85.7%. Sleep onset after lights out was 34.0 minutes with a REM latency of 120.0 minutes. The patient spent 12.29% of the night in stage N1 sleep, 44.82% in stage N2 sleep, 24.70% in stage N3 and 18.2% in REM. Wake after sleep onset (WASO) was 26.0 minutes. The Arousal Index was 14.2/hour.  RESPIRATORY PARAMETERS There were a total of 5 respiratory disturbances out of which 0 were apneas ( 0 obstructive, 0 mixed, 0 central) and 5 hypopneas. The apnea/hypopnea index (AHI) was 0.8 events/hour. The central sleep apnea index was 0.0  events/hour. The REM AHI was 1.8 events/hour and NREM AHI was 0.6 events/hour. The supine AHI was 0.0 events/hour and the non supine AHI was 1/hr. Respiratory disturbances were associated with oxygen desaturation down to a nadir of 84.00% during sleep. The mean oxygen saturation during the study was 93.29%. The cumulative time under 88% oxygen saturation was 0.4 minutes.  LEG MOVEMENT DATA The total leg movements were with a resulting leg movement index of /hr . Associated arousal with leg movement index was 0.3/hr.  CARDIAC DATA The underlying cardiac rhythm was most consistent with sinus rhythm. Mean heart rate during sleep was 76.79 bpm. Additional rhythm abnormalities include None.  IMPRESSIONS - No Significant Obstructive Sleep apnea(OSA) by AHI; mild OSA by RDI  DIAGNOSIS - Witnessed apnea  RECOMMENDATIONS - There is no indication for treatment of sleep disordered breathing based on this study.   Marius Ditch Sleep specialist, [place] [employer]  ELECTRONICALLY SIGNED ON:  02/13/2019, 7:54 PM [name] PH: [phone]   FX: [phone] [place]

## 2019-02-22 ENCOUNTER — Other Ambulatory Visit: Payer: Self-pay | Admitting: Hematology and Oncology

## 2019-02-22 ENCOUNTER — Telehealth: Payer: Self-pay

## 2019-02-22 ENCOUNTER — Other Ambulatory Visit: Payer: Self-pay | Admitting: Rheumatology

## 2019-02-22 DIAGNOSIS — C7961 Secondary malignant neoplasm of right ovary: Secondary | ICD-10-CM

## 2019-02-22 DIAGNOSIS — C541 Malignant neoplasm of endometrium: Secondary | ICD-10-CM

## 2019-02-22 NOTE — Telephone Encounter (Signed)
She has to use the vaginal cream consistently to prevent vaginal atrophy She can use estrogen containing products for her type of diease

## 2019-02-22 NOTE — Telephone Encounter (Signed)
Called and given below message. She does not need Compazine refill. Denies nausea.  She was was going to call today. She had a episode of spotting blood after sexual intercourse recently. She used the estradiol cream a few times only. But she stopped because of the hormones in the cream, she was concerned with her history. She started using a OTC cream for the vaginal dryness. What does Dr. Alvy Bimler advice? See a GYN?  She has noticed that after standing over 30 minutes she has neuropathy issues with her right foot since going back to work. She is still taking the Gabapentin. She just wanted Dr. Alvy Bimler to be aware.

## 2019-02-22 NOTE — Telephone Encounter (Signed)
Last Visit: 10/16/2018 Next Visit:03/20/2019  Okay to refill per Dr. Estanislado Pandy.

## 2019-02-22 NOTE — Telephone Encounter (Signed)
Called and given below message. She verbalized understanding. 

## 2019-02-22 NOTE — Telephone Encounter (Signed)
-----   Message from Heath Lark, MD sent at 02/22/2019 10:28 AM EST ----- Regarding: compazine refill I received an electronic request for refill which I have declined Does she need a refill? If so, please refill for her

## 2019-03-05 ENCOUNTER — Inpatient Hospital Stay: Payer: BC Managed Care – PPO

## 2019-03-05 ENCOUNTER — Encounter: Payer: Self-pay | Admitting: Hematology and Oncology

## 2019-03-06 ENCOUNTER — Inpatient Hospital Stay: Payer: BC Managed Care – PPO | Attending: Hematology and Oncology

## 2019-03-06 ENCOUNTER — Other Ambulatory Visit: Payer: Self-pay

## 2019-03-06 DIAGNOSIS — Z96652 Presence of left artificial knee joint: Secondary | ICD-10-CM | POA: Diagnosis not present

## 2019-03-06 DIAGNOSIS — C252 Malignant neoplasm of tail of pancreas: Secondary | ICD-10-CM | POA: Insufficient documentation

## 2019-03-06 DIAGNOSIS — C541 Malignant neoplasm of endometrium: Secondary | ICD-10-CM | POA: Insufficient documentation

## 2019-03-06 DIAGNOSIS — Z452 Encounter for adjustment and management of vascular access device: Secondary | ICD-10-CM | POA: Diagnosis not present

## 2019-03-06 DIAGNOSIS — C7961 Secondary malignant neoplasm of right ovary: Secondary | ICD-10-CM

## 2019-03-06 MED ORDER — SODIUM CHLORIDE 0.9% FLUSH
10.0000 mL | Freq: Once | INTRAVENOUS | Status: AC
  Administered 2019-03-06: 10 mL
  Filled 2019-03-06: qty 10

## 2019-03-06 MED ORDER — HEPARIN SOD (PORK) LOCK FLUSH 100 UNIT/ML IV SOLN
500.0000 [IU] | Freq: Once | INTRAVENOUS | Status: AC
  Administered 2019-03-06: 500 [IU]
  Filled 2019-03-06: qty 5

## 2019-03-12 ENCOUNTER — Telehealth: Payer: Self-pay | Admitting: Rheumatology

## 2019-03-12 ENCOUNTER — Other Ambulatory Visit: Payer: Self-pay | Admitting: Rheumatology

## 2019-03-12 ENCOUNTER — Encounter: Payer: Self-pay | Admitting: Hematology and Oncology

## 2019-03-12 NOTE — Telephone Encounter (Signed)
Patient called requesting prescription refill of [facility].  Patient states she took her last pill today.    Patient also stated that her neck is still stiff.  Patient states she has a virtual appointment on Tuesday, 03/20/19 and will discuss her neck pain and disability continuance at that appointment

## 2019-03-12 NOTE — Telephone Encounter (Signed)
Patient advised her prescription has been sent to the pharmacy. Patient states she will wait until her virtual visit to discuss her neck stiffness with Dr. Estanislado Pandy. Advised patient to call the office if it worsens.

## 2019-03-12 NOTE — Telephone Encounter (Signed)
Last Visit: 10/16/2018 Next Visit:03/20/2019  Okay to refill per Dr. Estanislado Pandy.

## 2019-03-13 NOTE — Progress Notes (Signed)
Virtual Visit via Video Note  I connected with Dana Hicks on 03/20/19 at  8:30 AM EST by a video enabled telemedicine application and verified that I am speaking with the correct person using two identifiers.  Location: Patient: Home  Provider: Clinic  This service was conducted via virtual visit.  Both audio and visual tools were used.  The patient was located at home. I was located in my office.  Consent was obtained prior to the virtual visit and is aware of possible charges through their insurance for this visit.  The patient is an established patient.  Dr. Estanislado Pandy, MD conducted the virtual visit and Hazel Sams, PA-C acted as scribe during the service.  Office staff helped with scheduling follow up visits after the service was conducted.   I discussed the limitations of evaluation and management by telemedicine and the availability of in person appointments. The patient expressed understanding and agreed to proceed.  CC: Pain in both shoulder joints  History of Present Illness: Patient is a 44 year old female with a past medical history of seronegative rheumatoid arthritis and osteoarthritis.  She is on Orencia 125 mg sq injections once weekly and Otrexup 20 mg sq injections once weekly. She is on long term prednisone 5 mg po daily.  She has been experiencing increased pain in the right elbow joint and both shoulder joints. She states sometimes her joint stiffness lasts all day. She tries taking a hot shower daily to alleviate her joint pain and stiffness.  She had a partial left knee replacement on 11/27/18.  She denies any complications. She underwent PT. She held [place] for surgery but resumed about 2 months ago. She continues to have pain in the right knee joint.   Review of Systems  Constitutional: Positive for malaise/fatigue. Negative for fever.  HENT: Negative for congestion.   Eyes: Negative for photophobia, pain, discharge and redness.  Respiratory: Negative for  cough, shortness of breath and wheezing.   Cardiovascular: Negative for chest pain, palpitations and leg swelling.  Gastrointestinal: Positive for constipation and diarrhea. Negative for blood in stool.  Genitourinary: Negative for dysuria and frequency.  Musculoskeletal: Positive for joint pain. Negative for back pain, myalgias and neck pain.  Skin: Negative for rash.  Neurological: Negative for dizziness and headaches.  Endo/Heme/Allergies: Bruises/bleeds easily.  Psychiatric/Behavioral: Positive for depression. The patient is nervous/anxious. The patient does not have insomnia.       Observations/Objective: Physical Exam  Constitutional: She is oriented to person, place, and time and well-developed, well-nourished, and in no distress.  HENT:  Head: Normocephalic and atraumatic.  Eyes: Conjunctivae are normal.  Pulmonary/Chest: Effort normal.  Neurological: She is alert and oriented to person, place, and time.  Psychiatric: Mood, memory, affect and judgment normal.    Patient reports joint stiffness all day  Patient reports nocturnal pain.  Difficulty dressing/grooming: Reports Difficulty climbing stairs: Reports Difficulty getting out of chair: Reports Difficulty using hands for taps, buttons, cutlery, and/or writing: Reports  Assessment and Plan: Visit Diagnoses: Rheumatoid arthritis of multiple sites with negative rheumatoid factor ([place]): She continues to have pain in multiple joints including both shoulder joints, right elbow joint, both knee joints, and right ankle joint.  She has not noticed any joint swelling. She had a partial left knee replacement on 11/27/18, but she continues to have discomfort and difficulty rising from a seated position.  She has been experiencing generalized joint stiffness lasting all day.  She previously resumed Orencia 125 mg  sq injections every 7 days and Otrexup 20 mg sq injections once weekly.  She continues to take long term prednisone 5 mg po  daily.  She has tried tapering prednisone to 2.5 mg daily but has been unable to due to joint stiffness. We discussed reducing to prednisone to 4 mg daily for 2 months and taper by 1 mg every 2 months.  We will send in prednisone 1 mg tablets for her to try to start tapering. She will continue on the current treatment regimen. She was advised to notify us if she develops joint inflammation.  She will follow up in 4 months.   She has returned to work part-time on M, W, F for limited hours.  She experiences significant fatigue after working and while at work has difficulty with word searching and memory loss while at work.   High risk medication use -She resumed Orencia 125 mg every 7 days and Otrexup 20 mg every 7 days in May 2020.  She continues to take prednisone 5 mg by mouth daily.  Last TB negative on 06/27/2018 and will monitor yearly.  CBC and CMP were drawn on 01/22/19.  She is due to update lab work in February and every 3 months. She was encouraged to receive the covid-19 vaccine once it is available to her.  HLA B27 (HLA B27 positive)  Status post left partial knee replacement: Performed by Dr. Ronnie Derby on 11/27/18.  She denies any complications.  She underwent physical therapy. She continues to have discomfort and difficulty getting out of a chair and climbing steps.    Primary osteoarthritis of right knee: Chronic severe pain. She denies any joint swelling.  She has difficulty climbing steps and getting up from a chair. She has been trying to lose weight recently.   Trapezius muscle spasm: She has limited ROM with discomfort. She had trigger point injections on 10/16/18, which provided temporary relief.  She takes robaxin 500 mg TID prn for muscle spasms. She was encouraged to only take robaxin as needed. She is planning on scheduling a massage.  Other medical conditions are listed as follows:  Family history of rheumatoid arthritis  Status post splenectomy  Secondary malignant  neoplasm of right ovary ([place]) - Dr. Alvy Bimler  Pancreatic neoplasm - surgical partial resection on 11/25/17.  She also had a splenectomy  History of colon cancer - Right hemicolectomy in 2001  Follow Up Instructions: She will follow up in 4 months.    I discussed the assessment and treatment plan with the patient. The patient was provided an opportunity to ask questions and all were answered. The patient agreed with the plan and demonstrated an understanding of the instructions.   The patient was advised to call back or seek an in-person evaluation if the symptoms worsen or if the condition fails to improve as anticipated.  I provided 25 minutes of non-face-to-face time during this encounter.   Bo Merino, MD   Scribed by-  Hazel Sams, PA-C

## 2019-03-20 ENCOUNTER — Other Ambulatory Visit: Payer: Self-pay

## 2019-03-20 ENCOUNTER — Encounter: Payer: Self-pay | Admitting: Rheumatology

## 2019-03-20 ENCOUNTER — Other Ambulatory Visit: Payer: Self-pay | Admitting: Physician Assistant

## 2019-03-20 ENCOUNTER — Telehealth (INDEPENDENT_AMBULATORY_CARE_PROVIDER_SITE_OTHER): Payer: BC Managed Care – PPO | Admitting: Rheumatology

## 2019-03-20 DIAGNOSIS — M1711 Unilateral primary osteoarthritis, right knee: Secondary | ICD-10-CM

## 2019-03-20 DIAGNOSIS — Z79899 Other long term (current) drug therapy: Secondary | ICD-10-CM

## 2019-03-20 DIAGNOSIS — Z1589 Genetic susceptibility to other disease: Secondary | ICD-10-CM | POA: Diagnosis not present

## 2019-03-20 DIAGNOSIS — C7961 Secondary malignant neoplasm of right ovary: Secondary | ICD-10-CM

## 2019-03-20 DIAGNOSIS — Z85038 Personal history of other malignant neoplasm of large intestine: Secondary | ICD-10-CM

## 2019-03-20 DIAGNOSIS — M62838 Other muscle spasm: Secondary | ICD-10-CM

## 2019-03-20 DIAGNOSIS — Z8261 Family history of arthritis: Secondary | ICD-10-CM

## 2019-03-20 DIAGNOSIS — M0609 Rheumatoid arthritis without rheumatoid factor, multiple sites: Secondary | ICD-10-CM

## 2019-03-20 DIAGNOSIS — D490 Neoplasm of unspecified behavior of digestive system: Secondary | ICD-10-CM

## 2019-03-20 DIAGNOSIS — Z96652 Presence of left artificial knee joint: Secondary | ICD-10-CM

## 2019-03-20 DIAGNOSIS — Z9081 Acquired absence of spleen: Secondary | ICD-10-CM

## 2019-03-20 MED ORDER — PREDNISONE 1 MG PO TABS: 4.0000 mg | ORAL_TABLET | Freq: Every day | ORAL | 0 refills | Status: DC

## 2019-03-20 NOTE — Telephone Encounter (Signed)
Ok to refill 

## 2019-03-20 NOTE — Addendum Note (Signed)
Addended by: Francis Gaines C on: 03/20/2019 11:01 AM   Modules accepted: Orders

## 2019-03-20 NOTE — Telephone Encounter (Addendum)
Last Visit:03/20/2019 Next Visit:Due May 2021 Labs: 01/22/19 WBC 17.2, Hgb 11.1, Hct 35.6, RDW, Platelets 481, Neutro Abs 13.3 Monocytes Absolute 1.5 Total Bilirubin <0.2  Okay to refillOtrexup?

## 2019-03-22 ENCOUNTER — Telehealth: Payer: Self-pay | Admitting: Rheumatology

## 2019-03-22 NOTE — Telephone Encounter (Signed)
I LMOM for patient to call, and schedule a follow up appointment for 4 months (around 07/19/2019).

## 2019-03-22 NOTE — Telephone Encounter (Signed)
-----   Message from Shona Needles, RT sent at 03/20/2019 11:21 AM EST ----- Regarding: 4 MONTH F/U

## 2019-04-02 ENCOUNTER — Encounter: Payer: Self-pay | Admitting: Rheumatology

## 2019-04-03 ENCOUNTER — Encounter: Payer: Self-pay | Admitting: Hematology and Oncology

## 2019-04-03 ENCOUNTER — Other Ambulatory Visit: Payer: Self-pay | Admitting: Hematology and Oncology

## 2019-04-03 MED ORDER — TRAMADOL HCL 50 MG PO TABS: 50.0000 mg | ORAL_TABLET | Freq: Four times a day (QID) | ORAL | 0 refills | Status: DC | PRN

## 2019-04-05 ENCOUNTER — Other Ambulatory Visit: Payer: Self-pay | Admitting: Rheumatology

## 2019-04-09 ENCOUNTER — Other Ambulatory Visit: Payer: Self-pay | Admitting: Hematology and Oncology

## 2019-04-09 ENCOUNTER — Encounter: Payer: Self-pay | Admitting: Hematology and Oncology

## 2019-04-09 ENCOUNTER — Telehealth: Payer: Self-pay

## 2019-04-09 DIAGNOSIS — K859 Acute pancreatitis without necrosis or infection, unspecified: Secondary | ICD-10-CM

## 2019-04-09 DIAGNOSIS — C252 Malignant neoplasm of tail of pancreas: Secondary | ICD-10-CM

## 2019-04-09 NOTE — Telephone Encounter (Signed)
Called and given lab only appt for tomorrow at 0930. She would like a virtual visit for 2/17 at 0830. She verbalized understanding.Scheduling message sent.

## 2019-04-10 ENCOUNTER — Other Ambulatory Visit: Payer: Self-pay | Admitting: Physician Assistant

## 2019-04-10 ENCOUNTER — Other Ambulatory Visit: Payer: Self-pay

## 2019-04-10 ENCOUNTER — Inpatient Hospital Stay: Payer: BC Managed Care – PPO | Attending: Hematology and Oncology

## 2019-04-10 DIAGNOSIS — T451X5A Adverse effect of antineoplastic and immunosuppressive drugs, initial encounter: Secondary | ICD-10-CM | POA: Diagnosis not present

## 2019-04-10 DIAGNOSIS — Z85038 Personal history of other malignant neoplasm of large intestine: Secondary | ICD-10-CM | POA: Diagnosis not present

## 2019-04-10 DIAGNOSIS — R109 Unspecified abdominal pain: Secondary | ICD-10-CM | POA: Insufficient documentation

## 2019-04-10 DIAGNOSIS — G8929 Other chronic pain: Secondary | ICD-10-CM | POA: Diagnosis not present

## 2019-04-10 DIAGNOSIS — Z79891 Long term (current) use of opiate analgesic: Secondary | ICD-10-CM | POA: Insufficient documentation

## 2019-04-10 DIAGNOSIS — Z7952 Long term (current) use of systemic steroids: Secondary | ICD-10-CM | POA: Insufficient documentation

## 2019-04-10 DIAGNOSIS — C252 Malignant neoplasm of tail of pancreas: Secondary | ICD-10-CM | POA: Diagnosis not present

## 2019-04-10 DIAGNOSIS — Z79899 Other long term (current) drug therapy: Secondary | ICD-10-CM | POA: Insufficient documentation

## 2019-04-10 DIAGNOSIS — Z9221 Personal history of antineoplastic chemotherapy: Secondary | ICD-10-CM | POA: Diagnosis not present

## 2019-04-10 DIAGNOSIS — Z8542 Personal history of malignant neoplasm of other parts of uterus: Secondary | ICD-10-CM | POA: Insufficient documentation

## 2019-04-10 DIAGNOSIS — G620 Drug-induced polyneuropathy: Secondary | ICD-10-CM | POA: Diagnosis not present

## 2019-04-10 DIAGNOSIS — Z1509 Genetic susceptibility to other malignant neoplasm: Secondary | ICD-10-CM | POA: Insufficient documentation

## 2019-04-10 DIAGNOSIS — C541 Malignant neoplasm of endometrium: Secondary | ICD-10-CM

## 2019-04-10 DIAGNOSIS — Z8543 Personal history of malignant neoplasm of ovary: Secondary | ICD-10-CM | POA: Diagnosis not present

## 2019-04-10 DIAGNOSIS — C7961 Secondary malignant neoplasm of right ovary: Secondary | ICD-10-CM

## 2019-04-10 DIAGNOSIS — K859 Acute pancreatitis without necrosis or infection, unspecified: Secondary | ICD-10-CM

## 2019-04-10 DIAGNOSIS — R599 Enlarged lymph nodes, unspecified: Secondary | ICD-10-CM | POA: Diagnosis not present

## 2019-04-10 LAB — COMPREHENSIVE METABOLIC PANEL
ALT: 46 U/L — ABNORMAL HIGH (ref 0–44)
AST: 43 U/L — ABNORMAL HIGH (ref 15–41)
Albumin: 3.7 g/dL (ref 3.5–5.0)
Alkaline Phosphatase: 70 U/L (ref 38–126)
Anion gap: 9 (ref 5–15)
BUN: 10 mg/dL (ref 6–20)
CO2: 29 mmol/L (ref 22–32)
Calcium: 8.9 mg/dL (ref 8.9–10.3)
Chloride: 102 mmol/L (ref 98–111)
Creatinine, Ser: 0.67 mg/dL (ref 0.44–1.00)
GFR calc Af Amer: >60 mL/min (ref >60)
GFR calc non Af Amer: >60 mL/min (ref >60)
Glucose, Bld: 91 mg/dL (ref 70–99)
Potassium: 4.1 mmol/L (ref 3.5–5.1)
Sodium: 140 mmol/L (ref 135–145)
Total Bilirubin: 0.4 mg/dL (ref 0.3–1.2)
Total Protein: 6.8 g/dL (ref 6.5–8.1)

## 2019-04-10 LAB — CBC WITH DIFFERENTIAL/PLATELET
Abs Immature Granulocytes: 0.05 10*3/uL (ref 0.00–0.07)
Basophils Absolute: 0 10*3/uL (ref 0.0–0.1)
Basophils Relative: 0 %
Eosinophils Absolute: 0.1 10*3/uL (ref 0.0–0.5)
Eosinophils Relative: 1 %
HCT: 35.6 % — ABNORMAL LOW (ref 36.0–46.0)
Hemoglobin: 11.3 g/dL — ABNORMAL LOW (ref 12.0–15.0)
Immature Granulocytes: 0 %
Lymphocytes Relative: 11 %
Lymphs Abs: 1.4 10*3/uL (ref 0.7–4.0)
MCH: 28.9 pg (ref 26.0–34.0)
MCHC: 31.7 g/dL (ref 30.0–36.0)
MCV: 91 fL (ref 80.0–100.0)
Monocytes Absolute: 1 10*3/uL (ref 0.1–1.0)
Monocytes Relative: 8 %
Neutro Abs: 10.3 10*3/uL — ABNORMAL HIGH (ref 1.7–7.7)
Neutrophils Relative %: 80 %
Platelets: 444 10*3/uL — ABNORMAL HIGH (ref 150–400)
RBC: 3.91 MIL/uL (ref 3.87–5.11)
RDW: 20 % — ABNORMAL HIGH (ref 11.5–15.5)
WBC: 12.8 10*3/uL — ABNORMAL HIGH (ref 4.0–10.5)
nRBC: 0 % (ref 0.0–0.2)

## 2019-04-10 LAB — AMYLASE: Amylase: 23 U/L — ABNORMAL LOW (ref 28–100)

## 2019-04-10 LAB — LIPASE, BLOOD: Lipase: 24 U/L (ref 11–51)

## 2019-04-10 NOTE — Telephone Encounter (Signed)
ok 

## 2019-04-10 NOTE — Telephone Encounter (Signed)
Last Visit: 03/20/2019 Next Visit: message sent to the front desk to schedule.  Labs: 04/10/2019 stable AST 43, ALT 46 TB Gold: 06/27/2018 negative   Okay to refill orencia?

## 2019-04-11 ENCOUNTER — Encounter: Payer: Self-pay | Admitting: Hematology and Oncology

## 2019-04-11 ENCOUNTER — Inpatient Hospital Stay (HOSPITAL_BASED_OUTPATIENT_CLINIC_OR_DEPARTMENT_OTHER): Payer: BC Managed Care – PPO | Admitting: Hematology and Oncology

## 2019-04-11 DIAGNOSIS — C252 Malignant neoplasm of tail of pancreas: Secondary | ICD-10-CM | POA: Diagnosis not present

## 2019-04-11 DIAGNOSIS — T451X5A Adverse effect of antineoplastic and immunosuppressive drugs, initial encounter: Secondary | ICD-10-CM | POA: Diagnosis not present

## 2019-04-11 DIAGNOSIS — G620 Drug-induced polyneuropathy: Secondary | ICD-10-CM

## 2019-04-11 DIAGNOSIS — R109 Unspecified abdominal pain: Secondary | ICD-10-CM | POA: Diagnosis not present

## 2019-04-11 LAB — CANCER ANTIGEN 19-9: CA 19-9: 10 U/mL (ref 0–35)

## 2019-04-11 LAB — CA 125: Cancer Antigen (CA) 125: 8.6 U/mL (ref 0.0–38.1)

## 2019-04-11 MED ORDER — HYDROMORPHONE HCL 4 MG PO TABS: 4.0000 mg | ORAL_TABLET | Freq: Four times a day (QID) | ORAL | 0 refills | Status: DC | PRN

## 2019-04-11 MED ORDER — GABAPENTIN 300 MG PO CAPS: 300.0000 mg | ORAL_CAPSULE | Freq: Three times a day (TID) | ORAL | 3 refills | Status: DC

## 2019-04-11 MED ORDER — OMEPRAZOLE 40 MG PO CPDR: 40.0000 mg | DELAYED_RELEASE_CAPSULE | Freq: Every day | ORAL | 11 refills | Status: DC

## 2019-04-12 ENCOUNTER — Other Ambulatory Visit: Payer: Self-pay | Admitting: Hematology and Oncology

## 2019-04-12 ENCOUNTER — Encounter: Payer: Self-pay | Admitting: Hematology and Oncology

## 2019-04-12 DIAGNOSIS — C252 Malignant neoplasm of tail of pancreas: Secondary | ICD-10-CM

## 2019-04-12 DIAGNOSIS — C541 Malignant neoplasm of endometrium: Secondary | ICD-10-CM

## 2019-04-12 DIAGNOSIS — C7961 Secondary malignant neoplasm of right ovary: Secondary | ICD-10-CM

## 2019-04-12 NOTE — Assessment & Plan Note (Signed)
The patient have high risk disease given her underlying genetic disorder and multiple malignancies Rather than waiting until March, I plan to repeat CT scan of the chest, abdomen and pelvis to rule out cancer recurrence, despite normal tumor marker

## 2019-04-12 NOTE — Assessment & Plan Note (Signed)
She has mild worsening peripheral neuropathy likely exacerbated by the cold weather I recommend increasing the dose of gabapentin to 3 times a day and she agreed

## 2019-04-12 NOTE — Assessment & Plan Note (Signed)
Based on history, the differential diagnosis for her postprandial abdominal pain is likely peptic ulcer disease versus mesenteric ischemia versus possibility of subacute bowel obstruction It is less likely to be due to pancreatitis given normal blood work I recommend short-term narcotic prescription to control her pain while I plan to arrange further imaging studies and referral to GI service for endoscopy evaluations Due to her chronic steroid therapy, it is possible she might have peptic ulcer disease and I recommend increasing the dose of omeprazole to 40 mg daily in the morning pending further work-up

## 2019-04-12 NOTE — Progress Notes (Signed)
HEMATOLOGY-ONCOLOGY ELECTRONIC VISIT PROGRESS NOTE  Patient Care Team: [name], MD as PCP - General (Family Medicine)  I connected with by [facility] video conference but then due to technology failure, this is converted into a telephone visit I discussed the limitations, risks, security and privacy concerns of performing an evaluation and management service by [facility] and the availability of in person appointments.  I also discussed with the patient that there may be a patient responsible charge related to this service. The patient expressed understanding and agreed to proceed.   ASSESSMENT & PLAN:  Abdominal pain Based on history, the differential diagnosis for her postprandial abdominal pain is likely peptic ulcer disease versus mesenteric ischemia versus possibility of subacute bowel obstruction It is less likely to be due to pancreatitis given normal blood work I recommend short-term narcotic prescription to control her pain while I plan to arrange further imaging studies and referral to GI service for endoscopy evaluations Due to her chronic steroid therapy, it is possible she might have peptic ulcer disease and I recommend increasing the dose of omeprazole to 40 mg daily in the morning pending further work-up  Pancreatic cancer [facility]) The patient have high risk disease given her underlying genetic disorder and multiple malignancies Rather than waiting until March, I plan to repeat CT scan of the chest, abdomen and pelvis to rule out cancer recurrence, despite normal tumor marker  Peripheral neuropathy due to chemotherapy [facility]) She has mild worsening peripheral neuropathy likely exacerbated by the cold weather I recommend increasing the dose of gabapentin to 3 times a day and she agreed   No orders of the defined types were placed in this encounter.   INTERVAL HISTORY: Please see below for problem oriented charting. Since last time I saw her, she have worsening mid to lower back  pain despite resumption of high-dose tramadol According to the patient, it is located somewhat below the bra line on her back especially on the left side and worse after eating At present time, her pain is rated at about 2-3 out of 10 but it could progress to 10 out of 10 at times It is usually worse within 30 minutes to an hour after eating She attempted to cut back on fatty food and went on low carbohydrate diet but it does not seem to help She was convinced it could be pancreatitis although repeat blood work did not show elevated lipase or amylase Several weeks ago, she attempted to go off Creon/pancreatic supplementation but had resumed taking it for the last week and a half that did not seem to help with her symptoms She denies changes in her bowel habits She denies nausea She noted slightly worsening peripheral neuropathy No worsening fever or chills She denies recent weight loss  SUMMARY OF ONCOLOGIC HISTORY: Oncology History Overview Note  MSI positive  Endometrial :endometrioid Ovarian: Endometrioid  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 Mixed attenuation mass emanates from the right adnexa measuring 10.8 x 8.0 cm very suspicious for right ovarian carcinoma. 2. Abnormality of the tail of the pancreas may be due to mild pancreatitis and small pseudocyst formation, but neoplasm cannot be excluded. 3. Small amount of ascites within abdomen and pelvis. 4. Small nonobstructing renal calculi bilaterally.    08/30/2017 Pathology Results   1. Uterus and cervix, with left fallopian tube - ENDOMETRIOID ADENOCARCINOMA, FIGO GRADE I, ARISING IN A BACKGROUND OF DIFFUSE COMPLEX  ATYPICAL HYPERPLASIA. - CARCINOMA INVADES FOR OF DEPTH OF 0.2 CM WHERE THICKNESS OF MYOMETRIAL WALL IS 2.1 CM. - ALL RESECTION MARGINS ARE NEGATIVE FOR CARCINOMA. - NEGATIVE FOR LYMPHOVASCULAR OR PERINEURAL INVASION. - CERVICAL  STROMA IS NOT INVOLVED. - SEE ONCOLOGY TABLE. - SEE NOTE 2. Ovary and fallopian tube, right - PRIMARY OVARIAN ENDOMETRIOID ADENOCARCINOMA, FIGO GRADE II, 12 CM. - THE OVARIAN SURFACE IS FOCALLY INVOLVED BY CARCINOMA. - NEGATIVE FOR LYMPHOVASCULAR INVASION. - BENIGN UNREMARKABLE FALLOPIAN TUBE, NEGATIVE FOR CARCINOMA. - SEE ONCOLOGY TABLE. - SEE NOTE 3. Cul-de-sac biopsy - METASTATIC ADENOCARCINOMA, MOST CONSISTENT WITH PRIMARY OVARIAN ENDOMETRIOID ADENOCARCINOMA. 4. Ovary, left - BENIGN UNREMARKABLE OVARY, NEGATIVE FOR MALIGNANCY. Microscopic Comment 1. UTERUS, CARCINOMA OR CARCINOSARCOMA Procedure: Total hysterectomy with bilateral salpingo-oophorectomy. Histologic type: Endometrioid adenocarcinoma. Histologic Grade: FIGO Grade I Myometrial invasion: Depth of invasion: 2 mm Myometrial thickness: 21 mm Uterine Serosa Involvement: Not identified Cervical stromal involvement: Not identified Extent of involvement of other organs: Not applicable Lymphovascular invasion: Not identified Regional Lymph Nodes: Examined: 0 Sentinel 0 Non-sentinel 0 Total Tumor block for ancillary studies: 1H MMR / MSI testing: Pending Pathologic Stage Classification (pTNM, AJCC 8th edition): pT1a, pNX (v4.1.0.0) 2. OVARY or FALLOPIAN TUBE or PRIMARY PERITONEUM: Procedure: Salpingo-oophorectomy Specimen Integrity: Intact Tumor Site: Right ovary Ovarian Surface Involvement (required only if applicable): Focally involved by carcinoma Fallopian Tube Surface Involvement (required only if applicable): Not identified Tumor Size: 12 cm Histologic Type: Endometrioid adenocarcinoma Histologic Grade: Grade II Implants (required for advanced stage serous/seromucinous borderline tumors only): Not applicable Other Tissue/ Organ Involvement: Cul de sac biopsy involved by tumor Largest Extrapelvic Peritoneal Focus (required only if applicable): Not applicable Peritoneal/Ascitic Fluid: Negative for carcinoma  (case # [id number]) Treatment Effect (required only for high-grade serous carcinomas): Not applicable Regional Lymph Nodes: No lymph nodes submitted or found Number of Lymph Nodes Examined: 0 Pathologic Stage Classification (pTNM, AJCC 8th Edition): pT2b, pN0 Representative Tumor Block: 2B and 2E 1. Molecular study for microsatellite instability and immunohistochemical stains for MMR-related proteins are pending and will be reported in an addendum. 2. Immunohistochemical stain show that the ovarian tumor is positive for CK7 and PAX8 (both diffuse), CDX2 (patchy and weak); and negative for CK20. This immunoprofile is consistent with the above diagnosis. Dr. Lyndon Code has reviewed this case and concurs with the above diagnosis. Molecular study for microsatellite instability and immunohistochemical stains for MMR-related proteins are pending and will be reported in an addendum   08/30/2017 Genetic Testing   Patient has genetic testing done for MSI  Results revealed patient has the following mutation(s): loss of [facility] 2   08/30/2017 Surgery   Surgeon: Mart Piggs, MD Pre-operative Diagnosis:  1. Adnexal mass 2. Abnormal uterine bleeding 3. H/o Cecal CA  Post-operative Diagnosis:  1. Adhesive disease post colon resection 2. Endometrial cancer NOS 3. Adenocarcinoma unknown origin, right ovary, suspicious for GI primary  Operation:  1. Lysis of adhesions ~30 minutes 2. Robotic-assisted laparoscopic total hysterectomy with right salpingo-oophorectomy and left salpingectomy 3. Left oophorectomy (RA-laparoscopic) 4. Pelvic washings  Findings: Adhesions of omentum to anterior abdominal wall. Enlarged cystic right ovary ~10cm. Uterus had small nodules on serosa near where right adnexa was intimate with the surface. No obvious intraoperative rupture of cyst, although in 2 areas the wall was thin and one of these areas had some bleeding. Slight scarring of left bladder dome to LUS/cervix. Uterus  on frozen section c/w hyperplasia and a small focus of endometrial CA - no myo invasion, <2cm in  size. Frozen section on the right adnexa was carcinoma, met from colon or possibly Gyn, favor GI primary, defer to permanent. Left ovary was WNL.     09/15/2017 Cancer Staging   Staging form: Corpus Uteri - Carcinoma and Carcinosarcoma, AJCC 8th Edition - Pathologic: FIGO Stage IA (pT1a, pN0, cM0) - Signed by Heath Lark, MD on 09/15/2017   09/26/2017 Procedure   Successful placement of a right internal jugular approach power injectable Port-A-Cath. The catheter is ready for immediate use.   11/07/2017 Imaging   1. Since 08/18/2017, similar to slight decrease in size of a pancreatic body/tail junction lesion. Cross modality comparison relative to 09/16/2017 MRI is also grossly similar. 2. No evidence of metastatic disease. 3. Aortic Atherosclerosis (ICD10-I70.0).  4. Left nephrolithiasis.   11/18/2017 Genetic Testing   MSH2 c.[id number] pathogenic mutation identified in the CancerNext panel.  The CancerNext gene panel offered by [employer] includes sequencing and rearrangement analysis for the following 34 genes:   APC, ATM, BARD1, BMPR1A, BRCA1, BRCA2, BRIP1, CDH1, CDK4, CDKN2A, CHEK2, DICER1, HOXB13, EPCAM, GREM1, MLH1, MRE11A, MSH2, MSH6, MUTYH, NBN, NF1, PALB2, PMS2, POLD1, POLE, PTEN, RAD50, RAD51C, RAD51D, SMAD4, SMARCA4, STK11, and TP53.  The report date is November 18, 2017.  MSH2 [id number] pathogenic mutation identified on somatic testing.  These results are consistent with a diagnosis of Lynch syndrome.   Secondary malignant neoplasm of right ovary ([place])  08/23/2017 Tumor Marker   Patient's tumor was tested for the following markers: CA-125 Results of the tumor marker test revealed 139.8   08/31/2017 Initial Diagnosis   Secondary malignant neoplasm of right ovary ([place])   09/15/2017 Cancer Staging   Staging form: Ovary, Fallopian Tube, and Primary Peritoneal Carcinoma, AJCC  8th Edition - Pathologic: Stage IIB (pT2b, pN0, cM0) - Signed by Heath Lark, MD on 09/15/2017   09/27/2017 Imaging   No evidence of metastatic disease or other acute findings within the thorax.  4 cm low-attenuation mass in pancreatic tail, highly suspicious for pancreatic carcinoma. This is caused splenic vein thrombosis, with new venous collaterals in the left upper quadrant. Consider endoscopic ultrasound with FNA for tissue diagnosis.  Stable benign hepatic hemangioma.    09/27/2017 Tumor Marker   Patient's tumor was tested for the following markers: CA-125 Results of the tumor marker test revealed 39.4   11/02/2017 Tumor Marker   Patient's tumor was tested for the following markers: CA-125 Results of the tumor marker test revealed 19   11/07/2017 Tumor Marker   Patient's tumor was tested for the following markers: CA-125 Results of the tumor marker test revealed 18.3   11/18/2017 Genetic Testing   MSH2 c.2237dupT pathogenic mutation identified in the CancerNext panel.  The CancerNext gene panel offered by [employer] includes sequencing and rearrangement analysis for the following 34 genes:   APC, ATM, BARD1, BMPR1A, BRCA1, BRCA2, BRIP1, CDH1, CDK4, CDKN2A, CHEK2, DICER1, HOXB13, EPCAM, GREM1, MLH1, MRE11A, MSH2, MSH6, MUTYH, NBN, NF1, PALB2, PMS2, POLD1, POLE, PTEN, RAD50, RAD51C, RAD51D, SMAD4, SMARCA4, STK11, and TP53.  The report date is November 18, 2017.  MSH2 [id number] pathogenic mutation identified on somatic testing.  These results are consistent with a diagnosis of Lynch syndrome.   12/15/2017 Tumor Marker   Patient's tumor was tested for the following markers: CA-125 Results of the tumor marker test revealed 57.3   01/16/2018 Tumor Marker   Patient's tumor was tested for the following markers: CA-125 Results of the tumor marker test revealed 24.2   04/10/2018 Tumor Marker   Patient's  tumor was tested for the following markers: CA-125 Results of the  tumor marker test revealed 18.2   07/12/2018 Tumor Marker   Patient's tumor was tested for the following markers: CA-125 Results of the tumor marker test revealed 11.8   10/16/2018 Tumor Marker   Patient's tumor was tested for the following markers: CA125 Results of the tumor marker test revealed 12.4.   Pancreatic cancer ([place])  10/13/2017 Pathology Results   Pancreas tail mass, endoscopic ultrasound-guided, fine needle aspiration II (smears and cell block): Adenocarcinoma   11/18/2017 Genetic Testing   MSH2 c.[id number] pathogenic mutation identified in the CancerNext panel.  The CancerNext gene panel offered by [employer] includes sequencing and rearrangement analysis for the following 34 genes:   APC, ATM, BARD1, BMPR1A, BRCA1, BRCA2, BRIP1, CDH1, CDK4, CDKN2A, CHEK2, DICER1, HOXB13, EPCAM, GREM1, MLH1, MRE11A, MSH2, MSH6, MUTYH, NBN, NF1, PALB2, PMS2, POLD1, POLE, PTEN, RAD50, RAD51C, RAD51D, SMAD4, SMARCA4, STK11, and TP53.  The report date is November 18, 2017.  MSH2 [id number] pathogenic mutation identified on somatic testing.  These results are consistent with a diagnosis of Lynch syndrome.   11/24/2017 Pathology Results   A. "TAIL OF PANCREAS AND SPLEEN", DISTAL PANCREATECTOMY AND SPLENECTOMY: Invasive ductal adenocarcinoma, moderately to poorly differentiated with focal signet ring cell features, of pancreas (distal).   The carcinoma is 2.5 cm in greatest dimension grossly.   Treatment effects present in the form of fibrosis (50%). No lymphovascular or definite perineural invasion identified. All surgical margins are negative for tumor or high-grade dysplasia. Adjacent mucinous neoplasm, most consistent with Intraductal papillary mucinous neoplasm (IPMN) with low-grade dysplasia.   Uninvolved pancreas show atrophy and focal acute inflammation.   Twelve benign lymph nodes (0/12). Spleen with no significant  histopathologic abnormalities.  PROCEDURE: distal pancreatectomy and splenectomy TUMOR SITE: distal pancreas TUMOR SIZE:  GREATEST DIMENSION: 2.5 cm  ADDITIONAL DIMENSIONS:  x  cm HISTOLOGIC TYPE: ductal adenocarcinoma HISTOLOGIC GRADE: grade 3 TUMOR EXTENSION: peripancreatic soft tissue MARGINS: negative for tumor TREATMENT EFFECT: treatment effects present in the form of fibrosis (50%). LYMPHOVASCULAR INVASION: not identified PERINEURAL INVASION: no definite evidence REGIONAL LYMPH NODES:   NUMBER OF LYMPH NODES INVOLVED: 0   NUMBER OF LYMPH NODES EXAMINED: 12 PATHOLOGIC STAGE CLASSIFICATION (pTNM, AJCC 8th Ed): ypT2, ypN0 DISTANT METASTASIS (pM): pMx ADDITIONAL PATHOLOGIC FINDINGS: mucinous neoplasm, most consistent with intraductal papillary mucinous neoplasm (IPMN) with low-grade dysplasia, is identified adjacent to the main tumor.   11/24/2017 Cancer Staging   Staging form: Exocrine Pancreas, AJCC 8th Edition - Pathologic stage from 11/24/2017: Stage IB (pT2, pN0, cM0) - Signed by Truitt Merle, MD on 01/03/2018   11/25/2017 Surgery   She had surgery at [facility] 1. Exploratory Laparotomy 2. Distal Pancreatectomy and Splenectomy 3. Intraoperative Ultrasound 4. Open Cholecystectomy    12/15/2017 Cancer Staging   Staging form: Exocrine Pancreas, AJCC 8th Edition - Clinical: Stage IB (cT2, cN0, cM0) - Signed by Heath Lark, MD on 12/15/2017   12/28/2017 Imaging   12/28/2017 CT Abdomen  IMPRESSION: 1. Postoperative findings from recent partial pancreatectomy including a 21 cubic cm fluid collection along the pancreatic resection margin which could represent early pseudocyst. 2. Nodularity along the lateral limb of the left adrenal gland could also be postoperative but merit surveillance, as the pancreatic lesion was in close proximity to this adrenal gland on the prior CT of 11/07/2017. 3. Asymmetric fullness inferiorly in the left breast. The patient has a history  of prior left breast procedures, correlation with mammography is recommended. 4.  Other imaging findings of potential clinical significance: Stable hemangioma in the left hepatic lobe. Splenectomy. Aortic Atherosclerosis (ICD10-I70.0). Right hemicolectomy. Small focus of fat necrosis in the right anterior abdominal wall subcutaneous tissues near the laparotomy site. Bilateral nonobstructive nephrolithiasis.   01/02/2018 Tumor Marker   Patient's tumor was tested for the following markers: CA-19-9 Results of the tumor marker test revealed 5   01/03/2018 - 06/05/2018 Chemotherapy   She received modified dose FOLFIRINOX   04/10/2018 Imaging   1. Distal pancreatectomy, without findings of recurrent or metastatic disease. 2. Incompletely imaged hypoenhancement within lower lobe right pulmonary artery branch is likely chronic (but interval since 10/09/2017) pulmonary embolism. Dedicated CTA could further evaluate. 3. Decrease in size of a peripancreatic complex fluid collection anteriorly, likely a resolving pseudocyst. 4. Decreased size of minimal fluid versus a borderline sized node in the gastrohepatic ligament. Recommend attention on follow-up. 5. Hepatic steatosis with a segment 4 hemangioma. 6. Aortic Atherosclerosis (ICD10-I70.0). This is significantly age advanced.   07/12/2018 Tumor Marker   Patient's tumor was tested for the following markers: CA-19-9 Results of the tumor marker test revealed 6   07/12/2018 Imaging   1. Status post distal pancreatectomy with stable postoperative fluid collection adjacent to the ventral aspect of the pancreatic head. No findings to suggest metastatic disease in the abdomen or pelvis. 2. Hepatic steatosis with small cavernous hemangioma in segment 4A of the liver. 3. Slight decreased size of nonenlarged gastrohepatic ligament lymph node, presumably benign. 4. Aortic atherosclerosis.    10/16/2018 Imaging   Status post distal pancreatectomy. Stable  postoperative seroma along the surgical margin.   Status post hysterectomy and right oophorectomy.   Status post right hemicolectomy with appendectomy.   No evidence of recurrent or metastatic disease.   No colonic wall thickening or mass is evident on CT.   01/22/2019 Tumor Marker   Patient's tumor was tested for the following markers: CA-19.9 Results of the tumor marker test revealed 5   01/22/2019 Imaging   1. No evidence of local recurrence or metastatic disease status post distal pancreatectomy and splenectomy. 2. Stable small seroma anterior to the pancreatic head. 3. Postsurgical changes as described. 4. Stable additional incidental findings including a hepatic hemangioma, nonobstructing bilateral renal calculi and aortic Atherosclerosis (ICD10-I70.0).     REVIEW OF SYSTEMS:   Constitutional: Denies fevers, chills or abnormal weight loss Eyes: Denies blurriness of vision Ears, nose, mouth, throat, and face: Denies mucositis or sore throat Respiratory: Denies cough, dyspnea or wheezes Cardiovascular: Denies palpitation, chest discomfort Gastrointestinal:  Denies nausea, heartburn or change in bowel habits Skin: Denies abnormal skin rashes Lymphatics: Denies new lymphadenopathy or easy bruising Neurological:Denies numbness, tingling or new weaknesses Behavioral/Psych: Mood is stable, no new changes  Extremities: No lower extremity edema All other systems were reviewed with the patient and are negative.  I have reviewed the past medical history, past surgical history, social history and family history with the patient and they are unchanged from previous note.  ALLERGIES:  is allergic to codeine; penicillins; and bactrim [sulfamethoxazole-trimethoprim].  MEDICATIONS:  Current Outpatient Medications  Medication Sig Dispense Refill  . Cetirizine HCl (ZYRTEC PO) Take by mouth.    . citalopram (CELEXA) 20 MG tablet Take 1 tablet (20 mg total) by mouth daily. 30 tablet 11    . CREON 24000-76000 units CPEP TAKE BY MOUTH 1 CAP BEFORE EACH MEAL AND 1 CAP AFTER EACH MEAL AND 1 CAP WITH SNACKS.    . diclofenac Sodium (VOLTAREN) 1 % GEL  Apply 2-4 grams to affected joint 4 times daily as needed. 400 g 1  . estradiol (ESTRACE VAGINAL) 0.1 MG/GM vaginal cream Place 1 Applicatorful vaginally 3 (three) times a week. 42.5 g 12  . fluticasone (FLONASE) 50 MCG/ACT nasal spray Place 1 spray into both nostrils daily.    . folic acid (FOLVITE) 1 MG tablet Take 2 tablets (2 mg total) by mouth daily. 180 tablet 3  . gabapentin (NEURONTIN) 300 MG capsule Take 1 capsule (300 mg total) by mouth 3 (three) times daily. 90 capsule 3  . HYDROmorphone (DILAUDID) 4 MG tablet Take 1 tablet (4 mg total) by mouth every 6 (six) hours as needed for severe pain. 30 tablet 0  . hydrOXYzine (ATARAX/VISTARIL) 10 MG tablet Take 10 mg by mouth 3 (three) times daily as needed.    . lidocaine-prilocaine (EMLA) cream Apply 1 application topically daily as needed (port). Apply to affected area once 30 g 11  . metFORMIN (GLUCOPHAGE) 500 MG tablet TAKE 1 TABLET (500 MG TOTAL) BY MOUTH 2 (TWO) TIMES DAILY WITH A MEAL. (Patient taking differently: Take 500 mg by mouth 2 (two) times daily with a meal. ) 60 tablet 1  . methocarbamol (ROBAXIN) 500 MG tablet TAKE 1 TABLET BY MOUTH 3 TIMES DAILY AS NEEDED FOR MUSCLE SPASMS. 90 tablet 0  . Multiple Vitamin (MULTIVITAMIN WITH MINERALS) TABS tablet Take 1 tablet by mouth daily.    Marland Kitchen omeprazole (PRILOSEC) 40 MG capsule Take 1 capsule (40 mg total) by mouth daily. 30 capsule 11  . ORENCIA CLICKJECT 924 MG/ML SOAJ INJECT ONE CLICKJECT PEN SUBCUTANEOUSLY EVERY WEEK. REFRIGERATE. ALLOW TO WARM TO ROOM TEMPERATURE PRIOR TO ADMINISTRATION. 12 mL 0  . OTREXUP 20 MG/0.4ML SOAJ INJECT ONE PEN SUBCUTANEOUSLY ONCE EVERY WEEK. STORE AT ROOM TEMPERATURE BETWEEN 68 - 77 DEGREES F. 12 pen 0  . predniSONE (DELTASONE) 1 MG tablet Take 4 tablets (4 mg total) by mouth daily with breakfast. 240  tablet 0  . simethicone (MYLICON) 268 MG chewable tablet Chew 125 mg by mouth every 6 (six) hours as needed for flatulence.    . traMADol (ULTRAM) 50 MG tablet Take 1 tablet (50 mg total) by mouth every 6 (six) hours as needed for moderate pain. 60 tablet 0   No current facility-administered medications for this visit.    PHYSICAL EXAMINATION: ECOG PERFORMANCE STATUS: 1 - Symptomatic but completely ambulatory  LABORATORY DATA:  I have reviewed the data as listed CMP Latest Ref Rng & Units 04/10/2019 01/22/2019 12/07/2018  Glucose 70 - 99 mg/dL 91 94 115(H)  BUN 6 - 20 mg/dL '10 14 18  ' Creatinine 0.44 - 1.00 mg/dL 0.67 0.65 0.69  Sodium 135 - 145 mmol/L 140 140 140  Potassium 3.5 - 5.1 mmol/L 4.1 4.2 3.7  Chloride 98 - 111 mmol/L 102 104 103  CO2 22 - 32 mmol/L '29 24 26  ' Calcium 8.9 - 10.3 mg/dL 8.9 9.2 9.2  Total Protein 6.5 - 8.1 g/dL 6.8 7.2 7.1  Total Bilirubin 0.3 - 1.2 mg/dL 0.4 <0.2(L) <0.2(L)  Alkaline Phos 38 - 126 U/L 70 66 117  AST 15 - 41 U/L 43(H) 22 16  ALT 0 - 44 U/L 46(H) 23 34    Lab Results  Component Value Date   WBC 12.8 (H) 04/10/2019   HGB 11.3 (L) 04/10/2019   HCT 35.6 (L) 04/10/2019   MCV 91.0 04/10/2019   PLT 444 (H) 04/10/2019   NEUTROABS 10.3 (H) 04/10/2019    I have reviewed  all her blood test results including tumor marker, amylase and lipase which were within normal limits  I discussed the assessment and treatment plan with the patient. The patient was provided an opportunity to ask questions and all were answered. The patient agreed with the plan and demonstrated an understanding of the instructions. The patient was advised to call back or seek an in-person evaluation if the symptoms worsen or if the condition fails to improve as anticipated.    I spent 30 minutes for the appointment reviewing test results, discuss management and coordination of care.  Heath Lark, MD 04/12/2019 8:39 AM

## 2019-04-17 ENCOUNTER — Ambulatory Visit (HOSPITAL_COMMUNITY)
Admission: RE | Admit: 2019-04-17 | Discharge: 2019-04-17 | Disposition: A | Discharge status: Home / Self Care | Payer: BC Managed Care – PPO | Source: Ambulatory Visit | Attending: Hematology and Oncology | Admitting: Hematology and Oncology

## 2019-04-17 ENCOUNTER — Other Ambulatory Visit: Payer: Self-pay

## 2019-04-17 DIAGNOSIS — C252 Malignant neoplasm of tail of pancreas: Secondary | ICD-10-CM

## 2019-04-17 DIAGNOSIS — C7961 Secondary malignant neoplasm of right ovary: Secondary | ICD-10-CM | POA: Diagnosis not present

## 2019-04-17 DIAGNOSIS — C541 Malignant neoplasm of endometrium: Secondary | ICD-10-CM | POA: Diagnosis not present

## 2019-04-17 DIAGNOSIS — C259 Malignant neoplasm of pancreas, unspecified: Secondary | ICD-10-CM | POA: Diagnosis not present

## 2019-04-17 DIAGNOSIS — C189 Malignant neoplasm of colon, unspecified: Secondary | ICD-10-CM | POA: Diagnosis not present

## 2019-04-17 MED ORDER — SODIUM CHLORIDE (PF) 0.9 % IJ SOLN
INTRAMUSCULAR | Status: AC
  Filled 2019-04-17: qty 50

## 2019-04-17 MED ORDER — IOHEXOL 300 MG/ML  SOLN
100.0000 mL | Freq: Once | INTRAMUSCULAR | Status: AC | PRN
  Administered 2019-04-17: 100 mL via INTRAVENOUS

## 2019-04-19 ENCOUNTER — Other Ambulatory Visit: Payer: Self-pay

## 2019-04-19 ENCOUNTER — Encounter: Payer: Self-pay | Admitting: Hematology and Oncology

## 2019-04-19 ENCOUNTER — Telehealth: Payer: Self-pay | Admitting: Hematology and Oncology

## 2019-04-19 ENCOUNTER — Inpatient Hospital Stay (HOSPITAL_BASED_OUTPATIENT_CLINIC_OR_DEPARTMENT_OTHER): Payer: BC Managed Care – PPO | Admitting: Hematology and Oncology

## 2019-04-19 DIAGNOSIS — K859 Acute pancreatitis without necrosis or infection, unspecified: Secondary | ICD-10-CM

## 2019-04-19 DIAGNOSIS — C252 Malignant neoplasm of tail of pancreas: Secondary | ICD-10-CM | POA: Diagnosis not present

## 2019-04-19 DIAGNOSIS — Z1509 Genetic susceptibility to other malignant neoplasm: Secondary | ICD-10-CM | POA: Diagnosis not present

## 2019-04-19 DIAGNOSIS — R109 Unspecified abdominal pain: Secondary | ICD-10-CM | POA: Diagnosis not present

## 2019-04-19 MED ORDER — METHADONE HCL 10 MG PO TABS: 20.0000 mg | ORAL_TABLET | Freq: Two times a day (BID) | ORAL | 0 refills | Status: DC

## 2019-04-19 MED ORDER — HYDROMORPHONE HCL 4 MG PO TABS: 4.0000 mg | ORAL_TABLET | Freq: Four times a day (QID) | ORAL | 0 refills | Status: DC | PRN

## 2019-04-19 NOTE — Telephone Encounter (Signed)
Scheduled appt per 2/25 sch message - unable to reach pt . Left message with appt date and time

## 2019-04-20 ENCOUNTER — Telehealth: Payer: Self-pay

## 2019-04-20 NOTE — Assessment & Plan Note (Signed)
She has acute on chronic abdominal pain She will continue Dilaudid as needed I recommend introduction of long-acting pain medicine at nighttime to get her better pain relief We discussed the risk and benefits of methadone prescription and she agreed to proceed I warned her about risk of sedation and constipation

## 2019-04-20 NOTE — Assessment & Plan Note (Signed)
Patient had multiple history of cancers, including past history of colon cancer, uterine cancer, ovarian cancer and pancreatic cancer I discussed with her the rationale of pursuing biopsy if needed to delineate the specific subtype of cancer that we might be dealing with Alternative solution would be to order different imaging modality or active surveillance with repeat imaging study in a few weeks I will get her case discussed at the next GI tumor board and will see her back next week for further management

## 2019-04-20 NOTE — Progress Notes (Signed)
HEMATOLOGY-ONCOLOGY ELECTRONIC VISIT PROGRESS NOTE  Patient Care Team: [name], MD as PCP - General (Family Medicine)  I connected with by [facility] video conference.  Due to technological failure, the visit today is switched to a telephone only visit  ASSESSMENT & PLAN:  Pancreatic cancer ([place]) I have reviewed imaging study with the patient CT imaging reviewed slightly enlarged lymph node near the pancreatic bed which is nonspecific Her tumor marker is not elevated I recommend discussion of her case at the next GI tumor board to see whether this lymph node is amenable to biopsy The options would be to order another imaging modality versus active surveillance with another close-up monitoring of CT scan in a few weeks I will see her back next week after discussion at the GI tumor board  Pancreatitis, acute Her recent history did support possibility of acute pancreatitis which has resolved by the time I have ordered her blood work Recent amylase and lipase were within normal limits She will continue pancreatic supplementation, avoid alcohol intake and aggressive pain management  Abdominal pain She has acute on chronic abdominal pain She will continue Dilaudid as needed I recommend introduction of long-acting pain medicine at nighttime to get her better pain relief We discussed the risk and benefits of methadone prescription and she agreed to proceed I warned her about risk of sedation and constipation  Lynch syndrome Patient had multiple history of cancers, including past history of colon cancer, uterine cancer, ovarian cancer and pancreatic cancer I discussed with her the rationale of pursuing biopsy if needed to delineate the specific subtype of cancer that we might be dealing with Alternative solution would be to order different imaging modality or active surveillance with repeat imaging study in a few weeks I will get her case discussed at the next GI tumor board and will see her  back next week for further management    No orders of the defined types were placed in this encounter.   INTERVAL HISTORY: Please see below for problem oriented charting. She continues to have severe intermittent abdominal pain, within 30 minutes of eating She disclosed to me, her symptoms began approximately 2 weeks ago when she decided to skip taking Creon for pancreatic supplement and took some alcohol drinks  soon after that, she has signs and symptoms suggestive of acute pancreatitis By the time her blood was drawn, her amylase and lipase were within normal limits The Dilaudid that was prescribed for her for pain was able to control her pain intermittently but she would wake up in the middle of the night with severe abdominal pain She had normal bowel movement.  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 Mixed attenuation mass emanates from the right adnexa measuring 10.8 x 8.0 cm very suspicious for right ovarian carcinoma. 2. Abnormality of the tail of the pancreas may be due to mild pancreatitis and small pseudocyst formation, but neoplasm cannot be excluded. 3. Small amount of ascites within abdomen and pelvis. 4. Small nonobstructing renal calculi bilaterally.    08/30/2017 Pathology Results   1. Uterus and cervix, with left fallopian tube - ENDOMETRIOID ADENOCARCINOMA, FIGO GRADE I, ARISING IN A BACKGROUND OF DIFFUSE COMPLEX ATYPICAL HYPERPLASIA. - CARCINOMA INVADES FOR OF DEPTH OF 0.2 CM WHERE THICKNESS OF  MYOMETRIAL WALL IS 2.1 CM. - ALL RESECTION MARGINS ARE NEGATIVE FOR CARCINOMA. - NEGATIVE FOR LYMPHOVASCULAR OR PERINEURAL INVASION. - CERVICAL STROMA IS NOT INVOLVED. - SEE ONCOLOGY TABLE. - SEE NOTE 2. Ovary and fallopian  tube, right - PRIMARY OVARIAN ENDOMETRIOID ADENOCARCINOMA, FIGO GRADE II, 12 CM. - THE OVARIAN SURFACE IS FOCALLY INVOLVED BY CARCINOMA. - NEGATIVE FOR LYMPHOVASCULAR INVASION. - BENIGN UNREMARKABLE FALLOPIAN TUBE, NEGATIVE FOR CARCINOMA. - SEE ONCOLOGY TABLE. - SEE NOTE 3. Cul-de-sac biopsy - METASTATIC ADENOCARCINOMA, MOST CONSISTENT WITH PRIMARY OVARIAN ENDOMETRIOID ADENOCARCINOMA. 4. Ovary, left - BENIGN UNREMARKABLE OVARY, NEGATIVE FOR MALIGNANCY. Microscopic Comment 1. UTERUS, CARCINOMA OR CARCINOSARCOMA Procedure: Total hysterectomy with bilateral salpingo-oophorectomy. Histologic type: Endometrioid adenocarcinoma. Histologic Grade: FIGO Grade I Myometrial invasion: Depth of invasion: 2 mm Myometrial thickness: 21 mm Uterine Serosa Involvement: Not identified Cervical stromal involvement: Not identified Extent of involvement of other organs: Not applicable Lymphovascular invasion: Not identified Regional Lymph Nodes: Examined: 0 Sentinel 0 Non-sentinel 0 Total Tumor block for ancillary studies: 1H MMR / MSI testing: Pending Pathologic Stage Classification (pTNM, AJCC 8th edition): pT1a, pNX (v4.1.0.0) 2. OVARY or FALLOPIAN TUBE or PRIMARY PERITONEUM: Procedure: Salpingo-oophorectomy Specimen Integrity: Intact Tumor Site: Right ovary Ovarian Surface Involvement (required only if applicable): Focally involved by carcinoma Fallopian Tube Surface Involvement (required only if applicable): Not identified Tumor Size: 12 cm Histologic Type: Endometrioid adenocarcinoma Histologic Grade: Grade II Implants (required for advanced stage serous/seromucinous borderline tumors only): Not applicable Other Tissue/ Organ Involvement: Cul de sac biopsy involved by tumor Largest Extrapelvic Peritoneal Focus (required only if applicable): Not applicable Peritoneal/Ascitic Fluid: Negative for carcinoma (case # [id number]) Treatment Effect (required only for high-grade serous  carcinomas): Not applicable Regional Lymph Nodes: No lymph nodes submitted or found Number of Lymph Nodes Examined: 0 Pathologic Stage Classification (pTNM, AJCC 8th Edition): pT2b, pN0 Representative Tumor Block: 2B and 2E 1. Molecular study for microsatellite instability and immunohistochemical stains for MMR-related proteins are pending and will be reported in an addendum. 2. Immunohistochemical stain show that the ovarian tumor is positive for CK7 and PAX8 (both diffuse), CDX2 (patchy and weak); and negative for CK20. This immunoprofile is consistent with the above diagnosis. Dr. Lyndon Code has reviewed this case and concurs with the above diagnosis. Molecular study for microsatellite instability and immunohistochemical stains for MMR-related proteins are pending and will be reported in an addendum   08/30/2017 Genetic Testing   Patient has genetic testing done for MSI  Results revealed patient has the following mutation(s): loss of [facility] 2   08/30/2017 Surgery   Surgeon: Mart Piggs, MD Pre-operative Diagnosis:  1. Adnexal mass 2. Abnormal uterine bleeding 3. H/o Cecal CA  Post-operative Diagnosis:  1. Adhesive disease post colon resection 2. Endometrial cancer NOS 3. Adenocarcinoma unknown origin, right ovary, suspicious for GI primary  Operation:  1. Lysis of adhesions ~30 minutes 2. Robotic-assisted laparoscopic total hysterectomy with right salpingo-oophorectomy and left salpingectomy 3. Left oophorectomy (RA-laparoscopic) 4. Pelvic washings  Findings: Adhesions of omentum to anterior abdominal wall. Enlarged cystic right ovary ~10cm. Uterus had small nodules on serosa near where right adnexa was intimate with the surface. No obvious intraoperative rupture of cyst, although in 2 areas the wall was thin and one of these areas had some bleeding. Slight scarring of left bladder dome to LUS/cervix. Uterus on frozen section c/w hyperplasia and a small focus of endometrial CA - no  myo invasion, <2cm in size. Frozen section on the right adnexa was carcinoma, met from colon or possibly  Gyn, favor GI primary, defer to permanent. Left ovary was WNL.     09/15/2017 Cancer Staging   Staging form: Corpus Uteri - Carcinoma and Carcinosarcoma, AJCC 8th Edition - Pathologic: FIGO Stage IA (pT1a, pN0, cM0) - Signed by Heath Lark, MD on 09/15/2017   09/26/2017 Procedure   Successful placement of a right internal jugular approach power injectable Port-A-Cath. The catheter is ready for immediate use.   11/07/2017 Imaging   1. Since 08/18/2017, similar to slight decrease in size of a pancreatic body/tail junction lesion. Cross modality comparison relative to 09/16/2017 MRI is also grossly similar. 2. No evidence of metastatic disease. 3. Aortic Atherosclerosis (ICD10-I70.0).  4. Left nephrolithiasis.   11/18/2017 Genetic Testing   MSH2 c.2237dupT pathogenic mutation identified in the CancerNext panel.  The CancerNext gene panel offered by [employer] includes sequencing and rearrangement analysis for the following 34 genes:   APC, ATM, BARD1, BMPR1A, BRCA1, BRCA2, BRIP1, CDH1, CDK4, CDKN2A, CHEK2, DICER1, HOXB13, EPCAM, GREM1, MLH1, MRE11A, MSH2, MSH6, MUTYH, NBN, NF1, PALB2, PMS2, POLD1, POLE, PTEN, RAD50, RAD51C, RAD51D, SMAD4, SMARCA4, STK11, and TP53.  The report date is November 18, 2017.  MSH2 [id number] pathogenic mutation identified on somatic testing.  These results are consistent with a diagnosis of Lynch syndrome.   Secondary malignant neoplasm of right ovary ([place])  08/23/2017 Tumor Marker   Patient's tumor was tested for the following markers: CA-125 Results of the tumor marker test revealed 139.8   08/31/2017 Initial Diagnosis   Secondary malignant neoplasm of right ovary ([place])   09/15/2017 Cancer Staging   Staging form: Ovary, Fallopian Tube, and Primary Peritoneal Carcinoma, AJCC 8th Edition - Pathologic: Stage IIB (pT2b, pN0, cM0) - Signed by Heath Lark, MD on 09/15/2017   09/27/2017 Imaging   No evidence of metastatic disease or other acute findings within the thorax.  4 cm low-attenuation mass in pancreatic tail, highly suspicious for pancreatic carcinoma. This is caused splenic vein thrombosis, with new venous collaterals in the left upper quadrant. Consider endoscopic ultrasound with FNA for tissue diagnosis.  Stable benign hepatic hemangioma.    09/27/2017 Tumor Marker   Patient's tumor was tested for the following markers: CA-125 Results of the tumor marker test revealed 39.4   11/02/2017 Tumor Marker   Patient's tumor was tested for the following markers: CA-125 Results of the tumor marker test revealed 19   11/07/2017 Tumor Marker   Patient's tumor was tested for the following markers: CA-125 Results of the tumor marker test revealed 18.3   11/18/2017 Genetic Testing   MSH2 c.2237dupT pathogenic mutation identified in the CancerNext panel.  The CancerNext gene panel offered by [employer] includes sequencing and rearrangement analysis for the following 34 genes:   APC, ATM, BARD1, BMPR1A, BRCA1, BRCA2, BRIP1, CDH1, CDK4, CDKN2A, CHEK2, DICER1, HOXB13, EPCAM, GREM1, MLH1, MRE11A, MSH2, MSH6, MUTYH, NBN, NF1, PALB2, PMS2, POLD1, POLE, PTEN, RAD50, RAD51C, RAD51D, SMAD4, SMARCA4, STK11, and TP53.  The report date is November 18, 2017.  MSH2 [id number] pathogenic mutation identified on somatic testing.  These results are consistent with a diagnosis of Lynch syndrome.   12/15/2017 Tumor Marker   Patient's tumor was tested for the following markers: CA-125 Results of the tumor marker test revealed 57.3   01/16/2018 Tumor Marker   Patient's tumor was tested for the following markers: CA-125 Results of the tumor marker test revealed 24.2   04/10/2018 Tumor Marker   Patient's tumor was tested for the following markers: CA-125 Results of the tumor marker test  revealed 18.2   07/12/2018 Tumor Marker   Patient's tumor  was tested for the following markers: CA-125 Results of the tumor marker test revealed 11.8   10/16/2018 Tumor Marker   Patient's tumor was tested for the following markers: CA125 Results of the tumor marker test revealed 12.4.   Pancreatic cancer ([place])  10/13/2017 Pathology Results   Pancreas tail mass, endoscopic ultrasound-guided, fine needle aspiration II (smears and cell block): Adenocarcinoma   11/18/2017 Genetic Testing   MSH2 c.2237dupT pathogenic mutation identified in the CancerNext panel.  The CancerNext gene panel offered by [employer] includes sequencing and rearrangement analysis for the following 34 genes:   APC, ATM, BARD1, BMPR1A, BRCA1, BRCA2, BRIP1, CDH1, CDK4, CDKN2A, CHEK2, DICER1, HOXB13, EPCAM, GREM1, MLH1, MRE11A, MSH2, MSH6, MUTYH, NBN, NF1, PALB2, PMS2, POLD1, POLE, PTEN, RAD50, RAD51C, RAD51D, SMAD4, SMARCA4, STK11, and TP53.  The report date is November 18, 2017.  MSH2 [id number] pathogenic mutation identified on somatic testing.  These results are consistent with a diagnosis of Lynch syndrome.   11/24/2017 Pathology Results   A. "TAIL OF PANCREAS AND SPLEEN", DISTAL PANCREATECTOMY AND SPLENECTOMY: Invasive ductal adenocarcinoma, moderately to poorly differentiated with focal signet ring cell features, of pancreas (distal).   The carcinoma is 2.5 cm in greatest dimension grossly.   Treatment effects present in the form of fibrosis (50%). No lymphovascular or definite perineural invasion identified. All surgical margins are negative for tumor or high-grade dysplasia. Adjacent mucinous neoplasm, most consistent with Intraductal papillary mucinous neoplasm (IPMN) with low-grade dysplasia.   Uninvolved pancreas show atrophy and focal acute inflammation.   Twelve benign lymph nodes (0/12). Spleen with no significant histopathologic abnormalities.  PROCEDURE: distal pancreatectomy and  splenectomy TUMOR SITE: distal pancreas TUMOR SIZE:  GREATEST DIMENSION: 2.5 cm  ADDITIONAL DIMENSIONS:  x  cm HISTOLOGIC TYPE: ductal adenocarcinoma HISTOLOGIC GRADE: grade 3 TUMOR EXTENSION: peripancreatic soft tissue MARGINS: negative for tumor TREATMENT EFFECT: treatment effects present in the form of fibrosis (50%). LYMPHOVASCULAR INVASION: not identified PERINEURAL INVASION: no definite evidence REGIONAL LYMPH NODES:   NUMBER OF LYMPH NODES INVOLVED: 0   NUMBER OF LYMPH NODES EXAMINED: 12 PATHOLOGIC STAGE CLASSIFICATION (pTNM, AJCC 8th Ed): ypT2, ypN0 DISTANT METASTASIS (pM): pMx ADDITIONAL PATHOLOGIC FINDINGS: mucinous neoplasm, most consistent with intraductal papillary mucinous neoplasm (IPMN) with low-grade dysplasia, is identified adjacent to the main tumor.   11/24/2017 Cancer Staging   Staging form: Exocrine Pancreas, AJCC 8th Edition - Pathologic stage from 11/24/2017: Stage IB (pT2, pN0, cM0) - Signed by Truitt Merle, MD on 01/03/2018   11/25/2017 Surgery   She had surgery at [facility] 1. Exploratory Laparotomy 2. Distal Pancreatectomy and Splenectomy 3. Intraoperative Ultrasound 4. Open Cholecystectomy    12/15/2017 Cancer Staging   Staging form: Exocrine Pancreas, AJCC 8th Edition - Clinical: Stage IB (cT2, cN0, cM0) - Signed by Heath Lark, MD on 12/15/2017   12/28/2017 Imaging   12/28/2017 CT Abdomen  IMPRESSION: 1. Postoperative findings from recent partial pancreatectomy including a 21 cubic cm fluid collection along the pancreatic resection margin which could represent early pseudocyst. 2. Nodularity along the lateral limb of the left adrenal gland could also be postoperative but merit surveillance, as the pancreatic lesion was in close proximity to this adrenal gland on the prior CT of 11/07/2017. 3. Asymmetric fullness inferiorly in the left breast. The patient has a history of prior left breast procedures, correlation with mammography is  recommended. 4. Other imaging findings of potential clinical significance: Stable hemangioma in the left hepatic lobe.  Splenectomy. Aortic Atherosclerosis (ICD10-I70.0). Right hemicolectomy. Small focus of fat necrosis in the right anterior abdominal wall subcutaneous tissues near the laparotomy site. Bilateral nonobstructive nephrolithiasis.   01/02/2018 Tumor Marker   Patient's tumor was tested for the following markers: CA-19-9 Results of the tumor marker test revealed 5   01/03/2018 - 06/05/2018 Chemotherapy   She received modified dose FOLFIRINOX   04/10/2018 Imaging   1. Distal pancreatectomy, without findings of recurrent or metastatic disease. 2. Incompletely imaged hypoenhancement within lower lobe right pulmonary artery branch is likely chronic (but interval since 10/09/2017) pulmonary embolism. Dedicated CTA could further evaluate. 3. Decrease in size of a peripancreatic complex fluid collection anteriorly, likely a resolving pseudocyst. 4. Decreased size of minimal fluid versus a borderline sized node in the gastrohepatic ligament. Recommend attention on follow-up. 5. Hepatic steatosis with a segment 4 hemangioma. 6. Aortic Atherosclerosis (ICD10-I70.0). This is significantly age advanced.   07/12/2018 Tumor Marker   Patient's tumor was tested for the following markers: CA-19-9 Results of the tumor marker test revealed 6   07/12/2018 Imaging   1. Status post distal pancreatectomy with stable postoperative fluid collection adjacent to the ventral aspect of the pancreatic head. No findings to suggest metastatic disease in the abdomen or pelvis. 2. Hepatic steatosis with small cavernous hemangioma in segment 4A of the liver. 3. Slight decreased size of nonenlarged gastrohepatic ligament lymph node, presumably benign. 4. Aortic atherosclerosis.    10/16/2018 Imaging   Status post distal pancreatectomy. Stable postoperative seroma along the surgical margin.   Status post  hysterectomy and right oophorectomy.   Status post right hemicolectomy with appendectomy.   No evidence of recurrent or metastatic disease.   No colonic wall thickening or mass is evident on CT.   01/22/2019 Tumor Marker   Patient's tumor was tested for the following markers: CA-19.9 Results of the tumor marker test revealed 5   01/22/2019 Imaging   1. No evidence of local recurrence or metastatic disease status post distal pancreatectomy and splenectomy. 2. Stable small seroma anterior to the pancreatic head. 3. Postsurgical changes as described. 4. Stable additional incidental findings including a hepatic hemangioma, nonobstructing bilateral renal calculi and aortic Atherosclerosis (ICD10-I70.0).   04/18/2019 Imaging   1. Status post distal pancreatectomy and splenectomy. There has been a gradual increase in ill-defined soft tissue and celiac axis/SMA origin lymph nodes adjacent to the distal pancreatectomy margin and lesser curvature of the stomach/gastric body over sequential prior examinations dated 01/22/2019 and 09/26/2018. An enlarged lymph node or soft tissue nodule adjacent to the SMA origin now measures 1.8 x 0.8 cm. Findings are concerning for local recurrence of pancreatic malignancy. 2. Separately from the above findings, there remains a 1.0 cm low-attenuation nodule anterior to the remnant pancreatic neck, most consistent with postoperative seroma 3. No evidence of distant metastatic disease in the chest, abdomen, or pelvis. 4. Postoperative findings of cholecystectomy, right hemicolectomy, and hysterectomy. 5. Bilateral nonobstructive nephrolithiasis. 6.  Aortic Atherosclerosis (ICD10-I70.0).         REVIEW OF SYSTEMS:   Constitutional: Denies fevers, chills or abnormal weight loss Eyes: Denies blurriness of vision Ears, nose, mouth, throat, and face: Denies mucositis or sore throat Respiratory: Denies cough, dyspnea or wheezes Cardiovascular: Denies palpitation,  chest discomfort Gastrointestinal:  Denies nausea, heartburn or change in bowel habits Skin: Denies abnormal skin rashes Lymphatics: Denies new lymphadenopathy or easy bruising Neurological:Denies numbness, tingling or new weaknesses Behavioral/Psych: Mood is stable, no new changes  Extremities: No lower extremity edema All other  systems were reviewed with the patient and are negative.  I have reviewed the past medical history, past surgical history, social history and family history with the patient and they are unchanged from previous note.  ALLERGIES:  is allergic to codeine; penicillins; and bactrim [sulfamethoxazole-trimethoprim].  MEDICATIONS:  Current Outpatient Medications  Medication Sig Dispense Refill  . Cetirizine HCl (ZYRTEC PO) Take by mouth.    . citalopram (CELEXA) 20 MG tablet Take 1 tablet (20 mg total) by mouth daily. 30 tablet 11  . CREON 24000-76000 units CPEP TAKE BY MOUTH 1 CAP BEFORE EACH MEAL AND 1 CAP AFTER EACH MEAL AND 1 CAP WITH SNACKS.    . diclofenac Sodium (VOLTAREN) 1 % GEL Apply 2-4 grams to affected joint 4 times daily as needed. 400 g 1  . estradiol (ESTRACE VAGINAL) 0.1 MG/GM vaginal cream Place 1 Applicatorful vaginally 3 (three) times a week. 42.5 g 12  . fluticasone (FLONASE) 50 MCG/ACT nasal spray Place 1 spray into both nostrils daily.    . folic acid (FOLVITE) 1 MG tablet Take 2 tablets (2 mg total) by mouth daily. 180 tablet 3  . gabapentin (NEURONTIN) 300 MG capsule Take 1 capsule (300 mg total) by mouth 3 (three) times daily. 90 capsule 3  . HYDROmorphone (DILAUDID) 4 MG tablet Take 1 tablet (4 mg total) by mouth every 6 (six) hours as needed for severe pain. 60 tablet 0  . hydrOXYzine (ATARAX/VISTARIL) 10 MG tablet Take 10 mg by mouth 3 (three) times daily as needed.    . lidocaine-prilocaine (EMLA) cream Apply 1 application topically daily as needed (port). Apply to affected area once 30 g 11  . metFORMIN (GLUCOPHAGE) 500 MG tablet TAKE 1  TABLET (500 MG TOTAL) BY MOUTH 2 (TWO) TIMES DAILY WITH A MEAL. (Patient taking differently: Take 500 mg by mouth 2 (two) times daily with a meal. ) 60 tablet 1  . methadone (DOLOPHINE) 10 MG tablet Take 2 tablets (20 mg total) by mouth every 12 (twelve) hours. 30 tablet 0  . methocarbamol (ROBAXIN) 500 MG tablet TAKE 1 TABLET BY MOUTH 3 TIMES DAILY AS NEEDED FOR MUSCLE SPASMS. 90 tablet 0  . Multiple Vitamin (MULTIVITAMIN WITH MINERALS) TABS tablet Take 1 tablet by mouth daily.    Marland Kitchen omeprazole (PRILOSEC) 40 MG capsule Take 1 capsule (40 mg total) by mouth daily. 30 capsule 11  . ORENCIA CLICKJECT 374 MG/ML SOAJ INJECT ONE CLICKJECT PEN SUBCUTANEOUSLY EVERY WEEK. REFRIGERATE. ALLOW TO WARM TO ROOM TEMPERATURE PRIOR TO ADMINISTRATION. 12 mL 0  . OTREXUP 20 MG/0.4ML SOAJ INJECT ONE PEN SUBCUTANEOUSLY ONCE EVERY WEEK. STORE AT ROOM TEMPERATURE BETWEEN 68 - 77 DEGREES F. 12 pen 0  . predniSONE (DELTASONE) 1 MG tablet Take 4 tablets (4 mg total) by mouth daily with breakfast. 240 tablet 0  . simethicone (MYLICON) 827 MG chewable tablet Chew 125 mg by mouth every 6 (six) hours as needed for flatulence.    . traMADol (ULTRAM) 50 MG tablet Take 1 tablet (50 mg total) by mouth every 6 (six) hours as needed for moderate pain. 60 tablet 0   No current facility-administered medications for this visit.    PHYSICAL EXAMINATION: ECOG PERFORMANCE STATUS: 1 - Symptomatic but completely ambulatory  LABORATORY DATA:  I have reviewed the data as listed CMP Latest Ref Rng & Units 04/10/2019 01/22/2019 12/07/2018  Glucose 70 - 99 mg/dL 91 94 115(H)  BUN 6 - 20 mg/dL '10 14 18  ' Creatinine 0.44 - 1.00 mg/dL 0.67 0.65  0.69  Sodium 135 - 145 mmol/L 140 140 140  Potassium 3.5 - 5.1 mmol/L 4.1 4.2 3.7  Chloride 98 - 111 mmol/L 102 104 103  CO2 22 - 32 mmol/L '29 24 26  ' Calcium 8.9 - 10.3 mg/dL 8.9 9.2 9.2  Total Protein 6.5 - 8.1 g/dL 6.8 7.2 7.1  Total Bilirubin 0.3 - 1.2 mg/dL 0.4 <0.2(L) <0.2(L)  Alkaline Phos 38  - 126 U/L 70 66 117  AST 15 - 41 U/L 43(H) 22 16  ALT 0 - 44 U/L 46(H) 23 34    Lab Results  Component Value Date   WBC 12.8 (H) 04/10/2019   HGB 11.3 (L) 04/10/2019   HCT 35.6 (L) 04/10/2019   MCV 91.0 04/10/2019   PLT 444 (H) 04/10/2019   NEUTROABS 10.3 (H) 04/10/2019     RADIOGRAPHIC STUDIES: I have personally reviewed the radiological images as listed and agreed with the findings in the report. CT CHEST W CONTRAST  Result Date: 04/18/2019 CLINICAL DATA:  Pancreatic cancer surveillance, pancreatic cancer, colon cancer, endometrial cancer, Lynch syndrome EXAM: CT CHEST, ABDOMEN, AND PELVIS WITH CONTRAST TECHNIQUE: Multidetector CT imaging of the chest, abdomen and pelvis was performed following the standard protocol during bolus administration of intravenous contrast. CONTRAST:  156m OMNIPAQUE IOHEXOL 300 MG/ML SOLN, additional oral enteric contrast COMPARISON:  CT abdomen pelvis, 01/22/2019, CT chest abdomen pelvis, 09/26/2018 FINDINGS: CT CHEST FINDINGS Cardiovascular: Right chest port catheter. Normal heart size. No pericardial effusion. Mediastinum/Nodes: No enlarged mediastinal, hilar, or axillary lymph nodes. Thyroid gland, trachea, and esophagus demonstrate no significant findings. Lungs/Pleura: Lungs are clear. No pleural effusion or pneumothorax. Musculoskeletal: No chest wall mass or suspicious bone lesions identified. CT ABDOMEN PELVIS FINDINGS Hepatobiliary: No change in a lesion of the anterior right lobe of the liver measuring 2.4 cm with nodular blood pool enhancement, consistent with benign hemangioma (series 7, image 68). Status post cholecystectomy. No biliary dilatation. Pancreas: Status post distal pancreatectomy. There has been a gradual increase in ill-defined soft tissue and celiac axis/SMA origin lymph nodes adjacent to the distal pancreatectomy margin and lesser curvature of the stomach/gastric body over sequential prior examinations dated 01/22/2019 and 09/26/2018  (series 7, image 92). An enlarged lymph node or soft tissue nodule adjacent to the SMA origin measures 1.8 x 0.8 cm (series 7, image 94). Separately from this finding, there remains a 1.0 cm low-attenuation nodule anterior to the remnant pancreatic neck, most consistent with postoperative seroma (series 2, image 54). Spleen: Status post splenectomy. Adrenals/Urinary Tract: Adrenal glands are unremarkable. Tiny bilateral nonobstructive renal calculi. No hydronephrosis. Bladder is unremarkable. Stomach/Bowel: Stomach is within normal limits. Status post right hemicolectomy with ileocolic anastomosis. No evidence of bowel wall thickening, distention, or inflammatory changes. Vascular/Lymphatic: Minimal aortic atherosclerosis. No enlarged abdominal or pelvic lymph nodes. Reproductive: Status post hysterectomy. Other: Small fat containing hernia component or focal fat necrosis overlying the abdominal wall just right of midline, perhaps related to prior port access site and unchanged compared to prior examination (series 2, image 65). No abdominopelvic ascites. Musculoskeletal: No acute or significant osseous findings. IMPRESSION: 1. Status post distal pancreatectomy and splenectomy. There has been a gradual increase in ill-defined soft tissue and celiac axis/SMA origin lymph nodes adjacent to the distal pancreatectomy margin and lesser curvature of the stomach/gastric body over sequential prior examinations dated 01/22/2019 and 09/26/2018. An enlarged lymph node or soft tissue nodule adjacent to the SMA origin now measures 1.8 x 0.8 cm. Findings are concerning for local recurrence of pancreatic malignancy.  2. Separately from the above findings, there remains a 1.0 cm low-attenuation nodule anterior to the remnant pancreatic neck, most consistent with postoperative seroma 3. No evidence of distant metastatic disease in the chest, abdomen, or pelvis. 4. Postoperative findings of cholecystectomy, right hemicolectomy, and  hysterectomy. 5. Bilateral nonobstructive nephrolithiasis. 6.  Aortic Atherosclerosis (ICD10-I70.0). Electronically Signed   By: Eddie Candle M.D.   On: 04/18/2019 10:39   CT Abdomen Pelvis W Contrast  Result Date: 04/18/2019 CLINICAL DATA:  Pancreatic cancer surveillance, pancreatic cancer, colon cancer, endometrial cancer, Lynch syndrome EXAM: CT CHEST, ABDOMEN, AND PELVIS WITH CONTRAST TECHNIQUE: Multidetector CT imaging of the chest, abdomen and pelvis was performed following the standard protocol during bolus administration of intravenous contrast. CONTRAST:  162m OMNIPAQUE IOHEXOL 300 MG/ML SOLN, additional oral enteric contrast COMPARISON:  CT abdomen pelvis, 01/22/2019, CT chest abdomen pelvis, 09/26/2018 FINDINGS: CT CHEST FINDINGS Cardiovascular: Right chest port catheter. Normal heart size. No pericardial effusion. Mediastinum/Nodes: No enlarged mediastinal, hilar, or axillary lymph nodes. Thyroid gland, trachea, and esophagus demonstrate no significant findings. Lungs/Pleura: Lungs are clear. No pleural effusion or pneumothorax. Musculoskeletal: No chest wall mass or suspicious bone lesions identified. CT ABDOMEN PELVIS FINDINGS Hepatobiliary: No change in a lesion of the anterior right lobe of the liver measuring 2.4 cm with nodular blood pool enhancement, consistent with benign hemangioma (series 7, image 68). Status post cholecystectomy. No biliary dilatation. Pancreas: Status post distal pancreatectomy. There has been a gradual increase in ill-defined soft tissue and celiac axis/SMA origin lymph nodes adjacent to the distal pancreatectomy margin and lesser curvature of the stomach/gastric body over sequential prior examinations dated 01/22/2019 and 09/26/2018 (series 7, image 92). An enlarged lymph node or soft tissue nodule adjacent to the SMA origin measures 1.8 x 0.8 cm (series 7, image 94). Separately from this finding, there remains a 1.0 cm low-attenuation nodule anterior to the remnant  pancreatic neck, most consistent with postoperative seroma (series 2, image 54). Spleen: Status post splenectomy. Adrenals/Urinary Tract: Adrenal glands are unremarkable. Tiny bilateral nonobstructive renal calculi. No hydronephrosis. Bladder is unremarkable. Stomach/Bowel: Stomach is within normal limits. Status post right hemicolectomy with ileocolic anastomosis. No evidence of bowel wall thickening, distention, or inflammatory changes. Vascular/Lymphatic: Minimal aortic atherosclerosis. No enlarged abdominal or pelvic lymph nodes. Reproductive: Status post hysterectomy. Other: Small fat containing hernia component or focal fat necrosis overlying the abdominal wall just right of midline, perhaps related to prior port access site and unchanged compared to prior examination (series 2, image 65). No abdominopelvic ascites. Musculoskeletal: No acute or significant osseous findings. IMPRESSION: 1. Status post distal pancreatectomy and splenectomy. There has been a gradual increase in ill-defined soft tissue and celiac axis/SMA origin lymph nodes adjacent to the distal pancreatectomy margin and lesser curvature of the stomach/gastric body over sequential prior examinations dated 01/22/2019 and 09/26/2018. An enlarged lymph node or soft tissue nodule adjacent to the SMA origin now measures 1.8 x 0.8 cm. Findings are concerning for local recurrence of pancreatic malignancy. 2. Separately from the above findings, there remains a 1.0 cm low-attenuation nodule anterior to the remnant pancreatic neck, most consistent with postoperative seroma 3. No evidence of distant metastatic disease in the chest, abdomen, or pelvis. 4. Postoperative findings of cholecystectomy, right hemicolectomy, and hysterectomy. 5. Bilateral nonobstructive nephrolithiasis. 6.  Aortic Atherosclerosis (ICD10-I70.0). Electronically Signed   By: AEddie CandleM.D.   On: 04/18/2019 10:39    I discussed the assessment and treatment plan with the patient.  The patient was provided an opportunity  to ask questions and all were answered. The patient agreed with the plan and demonstrated an understanding of the instructions. The patient was advised to call back or seek an in-person evaluation if the symptoms worsen or if the condition fails to improve as anticipated.    I spent 30 minutes for the appointment reviewing test results, discuss management and coordination of care.  Heath Lark, MD 04/20/2019 8:53 AM

## 2019-04-20 NOTE — Assessment & Plan Note (Signed)
I have reviewed imaging study with the patient CT imaging reviewed slightly enlarged lymph node near the pancreatic bed which is nonspecific Her tumor marker is not elevated I recommend discussion of her case at the next GI tumor board to see whether this lymph node is amenable to biopsy The options would be to order another imaging modality versus active surveillance with another close-up monitoring of CT scan in a few weeks I will see her back next week after discussion at the GI tumor board

## 2019-04-20 NOTE — Assessment & Plan Note (Signed)
Her recent history did support possibility of acute pancreatitis which has resolved by the time I have ordered her blood work Recent amylase and lipase were within normal limits She will continue pancreatic supplementation, avoid alcohol intake and aggressive pain management

## 2019-04-20 NOTE — Telephone Encounter (Signed)
She called and is asking what should she do at home for pancreatitis.  Diet, etc. Should see the dietician here?

## 2019-04-23 ENCOUNTER — Other Ambulatory Visit: Payer: BC Managed Care – PPO

## 2019-04-23 NOTE — Telephone Encounter (Signed)
Typically patient go on bowel rest and fluids only, advance as tolerated

## 2019-04-23 NOTE — Telephone Encounter (Signed)
Called and left below message. Ask her to call the office back if needed. 

## 2019-04-24 ENCOUNTER — Telehealth: Payer: BC Managed Care – PPO | Admitting: Hematology and Oncology

## 2019-04-24 ENCOUNTER — Other Ambulatory Visit: Payer: Self-pay | Admitting: Physician Assistant

## 2019-04-25 ENCOUNTER — Inpatient Hospital Stay: Payer: BC Managed Care – PPO | Attending: Hematology and Oncology | Admitting: Hematology and Oncology

## 2019-04-25 ENCOUNTER — Other Ambulatory Visit: Payer: Self-pay

## 2019-04-25 ENCOUNTER — Encounter: Payer: Self-pay | Admitting: Hematology and Oncology

## 2019-04-25 VITALS — BP 103/54 | HR 82 | Temp 98.0°F | Resp 20 | Ht 62.0 in | Wt 194.4 lb

## 2019-04-25 DIAGNOSIS — Z79899 Other long term (current) drug therapy: Secondary | ICD-10-CM | POA: Diagnosis not present

## 2019-04-25 DIAGNOSIS — Z8542 Personal history of malignant neoplasm of other parts of uterus: Secondary | ICD-10-CM | POA: Insufficient documentation

## 2019-04-25 DIAGNOSIS — Z85038 Personal history of other malignant neoplasm of large intestine: Secondary | ICD-10-CM | POA: Insufficient documentation

## 2019-04-25 DIAGNOSIS — C252 Malignant neoplasm of tail of pancreas: Secondary | ICD-10-CM

## 2019-04-25 DIAGNOSIS — Z7952 Long term (current) use of systemic steroids: Secondary | ICD-10-CM | POA: Insufficient documentation

## 2019-04-25 DIAGNOSIS — R109 Unspecified abdominal pain: Secondary | ICD-10-CM | POA: Diagnosis not present

## 2019-04-25 DIAGNOSIS — Z8543 Personal history of malignant neoplasm of ovary: Secondary | ICD-10-CM | POA: Diagnosis not present

## 2019-04-25 DIAGNOSIS — Z9079 Acquired absence of other genital organ(s): Secondary | ICD-10-CM | POA: Insufficient documentation

## 2019-04-25 DIAGNOSIS — Z90722 Acquired absence of ovaries, bilateral: Secondary | ICD-10-CM | POA: Diagnosis not present

## 2019-04-25 DIAGNOSIS — Z90710 Acquired absence of both cervix and uterus: Secondary | ICD-10-CM | POA: Diagnosis not present

## 2019-04-25 DIAGNOSIS — Z791 Long term (current) use of non-steroidal anti-inflammatories (NSAID): Secondary | ICD-10-CM | POA: Diagnosis not present

## 2019-04-25 DIAGNOSIS — C541 Malignant neoplasm of endometrium: Secondary | ICD-10-CM | POA: Diagnosis not present

## 2019-04-25 DIAGNOSIS — Z7984 Long term (current) use of oral hypoglycemic drugs: Secondary | ICD-10-CM | POA: Diagnosis not present

## 2019-04-25 NOTE — Progress Notes (Signed)
[place] OFFICE PROGRESS NOTE  Patient Care Team: [name], MD as PCP - General (Family Medicine)  ASSESSMENT & PLAN:  Pancreatic cancer [facility]) Her case was discussed at the GI tumor board this morning Consideration to be made for possible EUS/biopsy versus repeat another imaging study in a few weeks She also shared with me the feedback from her surgeon from [facility] who also recommended something similar Clinically, apart from intermittent pain, she appears to be functioning well  After a lot of discussion about various different options, we are in agreement to repeat another CT imaging again on April 5 Delaying biopsy will not jeopardize her health overall Biopsy is necessary due to her multiple malignancies I will reach out to her gastroenterologist to follow her after the CT imaging study If the CT imaging showed persistent disease, we might have to pursue EUS/biopsy to confirm I will repeat her tumor marker again next month when she comes in for port flush and blood work and I will see her back on April 6 to review all test results  Abdominal pain She continues to have abdominal pain especially at nighttime and after meals The differential diagnosis at this point could be due to subacute pancreatitis versus disease relapse She has done extensive dietary modification with low-fat diet She is currently on a taper course of prednisone which she has been on for a long time With dietary modification, she have less abdominal pain She is able to get her pain well controlled with methadone at nighttime for an extended release pain coverage, in addition to intermittent dose of oral Dilaudid during daytime for breakthrough pain She denies constipation with her current medication Overall, she is satisfied with her pain management We discussed narcotic refill policy   Orders Placed This Encounter  Procedures  . CT ABDOMEN PELVIS W CONTRAST    Standing Status:    Future    Standing Expiration Date:   04/24/2020    Order Specific Question:   If indicated for the ordered procedure, I authorize the administration of contrast media per Radiology protocol    Answer:   Yes    Order Specific Question:   Preferred imaging location?    Answer:   [facility]    Order Specific Question:   Radiology Contrast Protocol - do NOT remove file path    Answer:   [email or web address]    Order Specific Question:   ** REASON FOR EXAM (FREE TEXT)    Answer:   pancreatic ca, abnormal CT on 2/23 with abdominal pain, reassess, cancer versus pancreatitis    Order Specific Question:   Is patient pregnant?    Answer:   No    All questions were answered. The patient knows to call the clinic with any problems, questions or concerns. The total time spent in the appointment was 30 minutes encounter with patients including review of chart and various tests results, discussions about plan of care and coordination of care plan   Heath Lark, MD 04/25/2019 9:32 AM  INTERVAL HISTORY: Please see below for problem oriented charting. She returns for final recommendation related to recent abnormal imaging study and abdominal pain Her case was discussed at the GI tumor board this morning Since last time I saw her, she have done some various different dose adjustment to methadone She tolerated half a tablet to 1 full tablet of 10 mg methadone well that seems to carry her through the night with good pain control to the  point she can sleep well She is adjusting her diet aggressively with intermittent fasting and low calorie intake She have less pain when she take Ensure She is currently on a taper course of prednisone No recent nausea Denies recent changes in bowel habits  SUMMARY OF ONCOLOGIC HISTORY: Oncology History Overview Note  MSI positive  Endometrial :endometrioid Ovarian: 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 Mixed attenuation mass emanates from the right adnexa measuring 10.8 x 8.0 cm very suspicious for right ovarian carcinoma. 2. Abnormality of the tail of the pancreas may be due to mild pancreatitis and small pseudocyst formation, but neoplasm cannot be excluded. 3. Small amount of ascites within abdomen and pelvis. 4. Small nonobstructing renal calculi bilaterally.    08/30/2017 Pathology Results   1. Uterus and cervix, with left fallopian tube - ENDOMETRIOID ADENOCARCINOMA, FIGO GRADE I, ARISING IN A BACKGROUND OF DIFFUSE COMPLEX ATYPICAL HYPERPLASIA. - CARCINOMA INVADES FOR OF DEPTH OF 0.2 CM WHERE THICKNESS OF MYOMETRIAL WALL IS 2.1 CM. - ALL RESECTION MARGINS ARE NEGATIVE FOR CARCINOMA. - NEGATIVE FOR LYMPHOVASCULAR OR PERINEURAL INVASION. - CERVICAL STROMA IS NOT INVOLVED. - SEE ONCOLOGY TABLE. - SEE NOTE 2. Ovary and fallopian tube, right - PRIMARY OVARIAN ENDOMETRIOID ADENOCARCINOMA, FIGO GRADE II, 12 CM. - THE OVARIAN SURFACE IS FOCALLY INVOLVED BY CARCINOMA. - NEGATIVE FOR LYMPHOVASCULAR INVASION. - BENIGN UNREMARKABLE FALLOPIAN TUBE, NEGATIVE FOR CARCINOMA. - SEE ONCOLOGY TABLE. - SEE NOTE 3. Cul-de-sac biopsy - METASTATIC ADENOCARCINOMA, MOST CONSISTENT WITH PRIMARY OVARIAN ENDOMETRIOID ADENOCARCINOMA. 4. Ovary, left - BENIGN UNREMARKABLE OVARY, NEGATIVE FOR MALIGNANCY. Microscopic Comment 1. UTERUS, CARCINOMA OR CARCINOSARCOMA Procedure: Total hysterectomy with bilateral salpingo-oophorectomy. Histologic type: Endometrioid adenocarcinoma. Histologic Grade: FIGO Grade I Myometrial invasion: Depth of invasion: 2 mm Myometrial thickness: 21 mm Uterine Serosa Involvement: Not identified Cervical stromal involvement: Not identified Extent of involvement of other organs: Not applicable Lymphovascular invasion: Not identified Regional Lymph Nodes: Examined: 0 Sentinel 0 Non-sentinel 0 Total Tumor block  for ancillary studies: 1H MMR / MSI testing: Pending Pathologic Stage Classification (pTNM, AJCC 8th edition): pT1a, pNX (v4.1.0.0) 2. OVARY or FALLOPIAN TUBE or PRIMARY PERITONEUM: Procedure: Salpingo-oophorectomy Specimen Integrity: Intact Tumor Site: Right ovary Ovarian Surface Involvement (required only if applicable): Focally involved by carcinoma Fallopian Tube Surface Involvement (required only if applicable): Not identified Tumor Size: 12 cm Histologic Type: Endometrioid adenocarcinoma Histologic Grade: Grade II Implants (required for advanced stage serous/seromucinous borderline tumors only): Not applicable Other Tissue/ Organ Involvement: Cul de sac biopsy involved by tumor Largest Extrapelvic Peritoneal Focus (required only if applicable): Not applicable Peritoneal/Ascitic Fluid: Negative for carcinoma (case # [id number]) Treatment Effect (required only for high-grade serous carcinomas): Not applicable Regional Lymph Nodes: No lymph nodes submitted or found Number of Lymph Nodes Examined: 0 Pathologic Stage Classification (pTNM, AJCC 8th Edition): pT2b, pN0 Representative Tumor Block: 2B and 2E 1. Molecular study for microsatellite instability and immunohistochemical stains for MMR-related proteins are pending and will be reported in an addendum. 2. Immunohistochemical stain show that the ovarian tumor is positive for CK7 and PAX8 (both diffuse), CDX2 (patchy and weak); and negative for CK20. This immunoprofile is consistent with the above diagnosis. Dr. Lyndon Code has reviewed this case and concurs with the above diagnosis. Molecular study for microsatellite instability and immunohistochemical stains for MMR-related proteins are pending and will be reported in an addendum   08/30/2017 Genetic Testing   Patient has genetic testing  done for MSI  Results revealed patient has the following mutation(s): loss of [facility] 2   08/30/2017 Surgery   Surgeon: Mart Piggs, MD Pre-operative  Diagnosis:  1. Adnexal mass 2. Abnormal uterine bleeding 3. H/o Cecal CA  Post-operative Diagnosis:  1. Adhesive disease post colon resection 2. Endometrial cancer NOS 3. Adenocarcinoma unknown origin, right ovary, suspicious for GI primary  Operation:  1. Lysis of adhesions ~30 minutes 2. Robotic-assisted laparoscopic total hysterectomy with right salpingo-oophorectomy and left salpingectomy 3. Left oophorectomy (RA-laparoscopic) 4. Pelvic washings  Findings: Adhesions of omentum to anterior abdominal wall. Enlarged cystic right ovary ~10cm. Uterus had small nodules on serosa near where right adnexa was intimate with the surface. No obvious intraoperative rupture of cyst, although in 2 areas the wall was thin and one of these areas had some bleeding. Slight scarring of left bladder dome to LUS/cervix. Uterus on frozen section c/w hyperplasia and a small focus of endometrial CA - no myo invasion, <2cm in size. Frozen section on the right adnexa was carcinoma, met from colon or possibly Gyn, favor GI primary, defer to permanent. Left ovary was WNL.     09/15/2017 Cancer Staging   Staging form: Corpus Uteri - Carcinoma and Carcinosarcoma, AJCC 8th Edition - Pathologic: FIGO Stage IA (pT1a, pN0, cM0) - Signed by Heath Lark, MD on 09/15/2017   09/26/2017 Procedure   Successful placement of a right internal jugular approach power injectable Port-A-Cath. The catheter is ready for immediate use.   11/07/2017 Imaging   1. Since 08/18/2017, similar to slight decrease in size of a pancreatic body/tail junction lesion. Cross modality comparison relative to 09/16/2017 MRI is also grossly similar. 2. No evidence of metastatic disease. 3. Aortic Atherosclerosis (ICD10-I70.0).  4. Left nephrolithiasis.   11/18/2017 Genetic Testing   MSH2 c.2237dupT pathogenic mutation identified in the CancerNext panel.  The CancerNext gene panel offered by [employer] includes sequencing and rearrangement  analysis for the following 34 genes:   APC, ATM, BARD1, BMPR1A, BRCA1, BRCA2, BRIP1, CDH1, CDK4, CDKN2A, CHEK2, DICER1, HOXB13, EPCAM, GREM1, MLH1, MRE11A, MSH2, MSH6, MUTYH, NBN, NF1, PALB2, PMS2, POLD1, POLE, PTEN, RAD50, RAD51C, RAD51D, SMAD4, SMARCA4, STK11, and TP53.  The report date is November 18, 2017.  MSH2 [id number] pathogenic mutation identified on somatic testing.  These results are consistent with a diagnosis of Lynch syndrome.   Secondary malignant neoplasm of right ovary ([place])  08/23/2017 Tumor Marker   Patient's tumor was tested for the following markers: CA-125 Results of the tumor marker test revealed 139.8   08/31/2017 Initial Diagnosis   Secondary malignant neoplasm of right ovary ([place])   09/15/2017 Cancer Staging   Staging form: Ovary, Fallopian Tube, and Primary Peritoneal Carcinoma, AJCC 8th Edition - Pathologic: Stage IIB (pT2b, pN0, cM0) - Signed by Heath Lark, MD on 09/15/2017   09/27/2017 Imaging   No evidence of metastatic disease or other acute findings within the thorax.  4 cm low-attenuation mass in pancreatic tail, highly suspicious for pancreatic carcinoma. This is caused splenic vein thrombosis, with new venous collaterals in the left upper quadrant. Consider endoscopic ultrasound with FNA for tissue diagnosis.  Stable benign hepatic hemangioma.    09/27/2017 Tumor Marker   Patient's tumor was tested for the following markers: CA-125 Results of the tumor marker test revealed 39.4   11/02/2017 Tumor Marker   Patient's tumor was tested for the following markers: CA-125 Results of the tumor marker test revealed 19   11/07/2017 Tumor Marker   Patient's tumor  was tested for the following markers: CA-125 Results of the tumor marker test revealed 18.3   11/18/2017 Genetic Testing   MSH2 c.2237dupT pathogenic mutation identified in the CancerNext panel.  The CancerNext gene panel offered by [employer] includes sequencing and rearrangement  analysis for the following 34 genes:   APC, ATM, BARD1, BMPR1A, BRCA1, BRCA2, BRIP1, CDH1, CDK4, CDKN2A, CHEK2, DICER1, HOXB13, EPCAM, GREM1, MLH1, MRE11A, MSH2, MSH6, MUTYH, NBN, NF1, PALB2, PMS2, POLD1, POLE, PTEN, RAD50, RAD51C, RAD51D, SMAD4, SMARCA4, STK11, and TP53.  The report date is November 18, 2017.  MSH2 [id number] pathogenic mutation identified on somatic testing.  These results are consistent with a diagnosis of Lynch syndrome.   12/15/2017 Tumor Marker   Patient's tumor was tested for the following markers: CA-125 Results of the tumor marker test revealed 57.3   01/16/2018 Tumor Marker   Patient's tumor was tested for the following markers: CA-125 Results of the tumor marker test revealed 24.2   04/10/2018 Tumor Marker   Patient's tumor was tested for the following markers: CA-125 Results of the tumor marker test revealed 18.2   07/12/2018 Tumor Marker   Patient's tumor was tested for the following markers: CA-125 Results of the tumor marker test revealed 11.8   10/16/2018 Tumor Marker   Patient's tumor was tested for the following markers: CA125 Results of the tumor marker test revealed 12.4.   Pancreatic cancer ([place])  10/13/2017 Pathology Results   Pancreas tail mass, endoscopic ultrasound-guided, fine needle aspiration II (smears and cell block): Adenocarcinoma   11/18/2017 Genetic Testing   MSH2 c.2237dupT pathogenic mutation identified in the CancerNext panel.  The CancerNext gene panel offered by [employer] includes sequencing and rearrangement analysis for the following 34 genes:   APC, ATM, BARD1, BMPR1A, BRCA1, BRCA2, BRIP1, CDH1, CDK4, CDKN2A, CHEK2, DICER1, HOXB13, EPCAM, GREM1, MLH1, MRE11A, MSH2, MSH6, MUTYH, NBN, NF1, PALB2, PMS2, POLD1, POLE, PTEN, RAD50, RAD51C, RAD51D, SMAD4, SMARCA4, STK11, and TP53.  The report date is November 18, 2017.  MSH2 [id number] pathogenic mutation identified on somatic testing.  These results  are consistent with a diagnosis of Lynch syndrome.   11/24/2017 Pathology Results   A. "TAIL OF PANCREAS AND SPLEEN", DISTAL PANCREATECTOMY AND SPLENECTOMY: Invasive ductal adenocarcinoma, moderately to poorly differentiated with focal signet ring cell features, of pancreas (distal).   The carcinoma is 2.5 cm in greatest dimension grossly.   Treatment effects present in the form of fibrosis (50%). No lymphovascular or definite perineural invasion identified. All surgical margins are negative for tumor or high-grade dysplasia. Adjacent mucinous neoplasm, most consistent with Intraductal papillary mucinous neoplasm (IPMN) with low-grade dysplasia.   Uninvolved pancreas show atrophy and focal acute inflammation.   Twelve benign lymph nodes (0/12). Spleen with no significant histopathologic abnormalities.  PROCEDURE: distal pancreatectomy and splenectomy TUMOR SITE: distal pancreas TUMOR SIZE:  GREATEST DIMENSION: 2.5 cm  ADDITIONAL DIMENSIONS:  x  cm HISTOLOGIC TYPE: ductal adenocarcinoma HISTOLOGIC GRADE: grade 3 TUMOR EXTENSION: peripancreatic soft tissue MARGINS: negative for tumor TREATMENT EFFECT: treatment effects present in the form of fibrosis (50%). LYMPHOVASCULAR INVASION: not identified PERINEURAL INVASION: no definite evidence REGIONAL LYMPH NODES:   NUMBER OF LYMPH NODES INVOLVED: 0   NUMBER OF LYMPH NODES EXAMINED: 12 PATHOLOGIC STAGE CLASSIFICATION (pTNM, AJCC 8th Ed): ypT2, ypN0 DISTANT METASTASIS (pM): pMx ADDITIONAL PATHOLOGIC FINDINGS: mucinous neoplasm, most consistent with intraductal papillary mucinous neoplasm (IPMN) with low-grade dysplasia, is identified adjacent to the main tumor.   11/24/2017 Cancer Staging   Staging form: Exocrine Pancreas, AJCC 8th  Edition - Pathologic stage from 11/24/2017: Stage IB (pT2, pN0, cM0) - Signed by Truitt Merle, MD on 01/03/2018   11/25/2017 Surgery   She had surgery  at [facility] 1. Exploratory Laparotomy 2. Distal Pancreatectomy and Splenectomy 3. Intraoperative Ultrasound 4. Open Cholecystectomy    12/15/2017 Cancer Staging   Staging form: Exocrine Pancreas, AJCC 8th Edition - Clinical: Stage IB (cT2, cN0, cM0) - Signed by Heath Lark, MD on 12/15/2017   12/28/2017 Imaging   12/28/2017 CT Abdomen  IMPRESSION: 1. Postoperative findings from recent partial pancreatectomy including a 21 cubic cm fluid collection along the pancreatic resection margin which could represent early pseudocyst. 2. Nodularity along the lateral limb of the left adrenal gland could also be postoperative but merit surveillance, as the pancreatic lesion was in close proximity to this adrenal gland on the prior CT of 11/07/2017. 3. Asymmetric fullness inferiorly in the left breast. The patient has a history of prior left breast procedures, correlation with mammography is recommended. 4. Other imaging findings of potential clinical significance: Stable hemangioma in the left hepatic lobe. Splenectomy. Aortic Atherosclerosis (ICD10-I70.0). Right hemicolectomy. Small focus of fat necrosis in the right anterior abdominal wall subcutaneous tissues near the laparotomy site. Bilateral nonobstructive nephrolithiasis.   01/02/2018 Tumor Marker   Patient's tumor was tested for the following markers: CA-19-9 Results of the tumor marker test revealed 5   01/03/2018 - 06/05/2018 Chemotherapy   She received modified dose FOLFIRINOX   04/10/2018 Imaging   1. Distal pancreatectomy, without findings of recurrent or metastatic disease. 2. Incompletely imaged hypoenhancement within lower lobe right pulmonary artery branch is likely chronic (but interval since 10/09/2017) pulmonary embolism. Dedicated CTA could further evaluate. 3. Decrease in size of a peripancreatic complex fluid collection anteriorly, likely a resolving pseudocyst. 4. Decreased size of minimal fluid versus a borderline sized  node in the gastrohepatic ligament. Recommend attention on follow-up. 5. Hepatic steatosis with a segment 4 hemangioma. 6. Aortic Atherosclerosis (ICD10-I70.0). This is significantly age advanced.   07/12/2018 Tumor Marker   Patient's tumor was tested for the following markers: CA-19-9 Results of the tumor marker test revealed 6   07/12/2018 Imaging   1. Status post distal pancreatectomy with stable postoperative fluid collection adjacent to the ventral aspect of the pancreatic head. No findings to suggest metastatic disease in the abdomen or pelvis. 2. Hepatic steatosis with small cavernous hemangioma in segment 4A of the liver. 3. Slight decreased size of nonenlarged gastrohepatic ligament lymph node, presumably benign. 4. Aortic atherosclerosis.    10/16/2018 Imaging   Status post distal pancreatectomy. Stable postoperative seroma along the surgical margin.   Status post hysterectomy and right oophorectomy.   Status post right hemicolectomy with appendectomy.   No evidence of recurrent or metastatic disease.   No colonic wall thickening or mass is evident on CT.   01/22/2019 Tumor Marker   Patient's tumor was tested for the following markers: CA-19.9 Results of the tumor marker test revealed 5   01/22/2019 Imaging   1. No evidence of local recurrence or metastatic disease status post distal pancreatectomy and splenectomy. 2. Stable small seroma anterior to the pancreatic head. 3. Postsurgical changes as described. 4. Stable additional incidental findings including a hepatic hemangioma, nonobstructing bilateral renal calculi and aortic Atherosclerosis (ICD10-I70.0).   04/18/2019 Imaging   1. Status post distal pancreatectomy and splenectomy. There has been a gradual increase in ill-defined soft tissue and celiac axis/SMA origin lymph nodes adjacent to the distal pancreatectomy margin and lesser curvature of the  stomach/gastric body over sequential prior examinations dated  01/22/2019 and 09/26/2018. An enlarged lymph node or soft tissue nodule adjacent to the SMA origin now measures 1.8 x 0.8 cm. Findings are concerning for local recurrence of pancreatic malignancy. 2. Separately from the above findings, there remains a 1.0 cm low-attenuation nodule anterior to the remnant pancreatic neck, most consistent with postoperative seroma 3. No evidence of distant metastatic disease in the chest, abdomen, or pelvis. 4. Postoperative findings of cholecystectomy, right hemicolectomy, and hysterectomy. 5. Bilateral nonobstructive nephrolithiasis. 6.  Aortic Atherosclerosis (ICD10-I70.0).         REVIEW OF SYSTEMS:   Constitutional: Denies fevers, chills or abnormal weight loss Eyes: Denies blurriness of vision Ears, nose, mouth, throat, and face: Denies mucositis or sore throat Respiratory: Denies cough, dyspnea or wheezes Cardiovascular: Denies palpitation, chest discomfort or lower extremity swelling Gastrointestinal:  Denies nausea, heartburn or change in bowel habits Skin: Denies abnormal skin rashes Lymphatics: Denies new lymphadenopathy or easy bruising Neurological:Denies numbness, tingling or new weaknesses Behavioral/Psych: Mood is stable, no new changes  All other systems were reviewed with the patient and are negative.  I have reviewed the past medical history, past surgical history, social history and family history with the patient and they are unchanged from previous note.  ALLERGIES:  is allergic to codeine; penicillins; and bactrim [sulfamethoxazole-trimethoprim].  MEDICATIONS:  Current Outpatient Medications  Medication Sig Dispense Refill  . Cetirizine HCl (ZYRTEC PO) Take by mouth.    . citalopram (CELEXA) 20 MG tablet Take 1 tablet (20 mg total) by mouth daily. 30 tablet 11  . CREON 24000-76000 units CPEP TAKE BY MOUTH 1 CAP BEFORE EACH MEAL AND 1 CAP AFTER EACH MEAL AND 1 CAP WITH SNACKS.    . diclofenac Sodium (VOLTAREN) 1 % GEL Apply 2-4  grams to affected joint 4 times daily as needed. 400 g 1  . estradiol (ESTRACE VAGINAL) 0.1 MG/GM vaginal cream Place 1 Applicatorful vaginally 3 (three) times a week. 42.5 g 12  . fluticasone (FLONASE) 50 MCG/ACT nasal spray Place 1 spray into both nostrils daily.    . folic acid (FOLVITE) 1 MG tablet Take 2 tablets (2 mg total) by mouth daily. 180 tablet 3  . gabapentin (NEURONTIN) 300 MG capsule Take 1 capsule (300 mg total) by mouth 3 (three) times daily. 90 capsule 3  . HYDROmorphone (DILAUDID) 4 MG tablet Take 1 tablet (4 mg total) by mouth every 6 (six) hours as needed for severe pain. 60 tablet 0  . hydrOXYzine (ATARAX/VISTARIL) 10 MG tablet Take 10 mg by mouth 3 (three) times daily as needed.    . lidocaine-prilocaine (EMLA) cream Apply 1 application topically daily as needed (port). Apply to affected area once 30 g 11  . metFORMIN (GLUCOPHAGE) 500 MG tablet TAKE 1 TABLET (500 MG TOTAL) BY MOUTH 2 (TWO) TIMES DAILY WITH A MEAL. (Patient taking differently: Take 500 mg by mouth 2 (two) times daily with a meal. ) 60 tablet 1  . methadone (DOLOPHINE) 10 MG tablet Take 2 tablets (20 mg total) by mouth every 12 (twelve) hours. 30 tablet 0  . methocarbamol (ROBAXIN) 500 MG tablet TAKE 1 TABLET BY MOUTH 3 TIMES DAILY AS NEEDED FOR MUSCLE SPASMS. 90 tablet 0  . Multiple Vitamin (MULTIVITAMIN WITH MINERALS) TABS tablet Take 1 tablet by mouth daily.    Marland Kitchen omeprazole (PRILOSEC) 40 MG capsule Take 1 capsule (40 mg total) by mouth daily. 30 capsule 11  . ORENCIA CLICKJECT 939 MG/ML SOAJ INJECT ONE  CLICKJECT PEN SUBCUTANEOUSLY EVERY WEEK. REFRIGERATE. ALLOW TO WARM TO ROOM TEMPERATURE PRIOR TO ADMINISTRATION. 12 mL 0  . OTREXUP 20 MG/0.4ML SOAJ INJECT ONE PEN SUBCUTANEOUSLY ONCE EVERY WEEK. STORE AT ROOM TEMPERATURE BETWEEN 68 - 77 DEGREES F. 12 pen 0  . predniSONE (DELTASONE) 1 MG tablet Take 4 tablets (4 mg total) by mouth daily with breakfast. 240 tablet 0  . simethicone (MYLICON) 678 MG chewable tablet  Chew 125 mg by mouth every 6 (six) hours as needed for flatulence.    . traMADol (ULTRAM) 50 MG tablet Take 1 tablet (50 mg total) by mouth every 6 (six) hours as needed for moderate pain. 60 tablet 0   No current facility-administered medications for this visit.    PHYSICAL EXAMINATION: ECOG PERFORMANCE STATUS: 1 - Symptomatic but completely ambulatory  Vitals:   04/25/19 0905  BP: (!) 103/54  Pulse: 82  Resp: 20  Temp: 98 F (36.7 C)  SpO2: 100%   Filed Weights   04/25/19 0905  Weight: 194 lb 6.4 oz (88.2 kg)    GENERAL:alert, no distress and comfortable Musculoskeletal:no cyanosis of digits and no clubbing  NEURO: alert & oriented x 3 with fluent speech, no focal motor/sensory deficits  LABORATORY DATA:  I have reviewed the data as listed    Component Value Date/Time   NA 140 04/10/2019 0929   K 4.1 04/10/2019 0929   CL 102 04/10/2019 0929   CO2 29 04/10/2019 0929   GLUCOSE 91 04/10/2019 0929   BUN 10 04/10/2019 0929   CREATININE 0.67 04/10/2019 0929   CREATININE 0.71 05/23/2018 0905   CREATININE 0.68 07/27/2017 0851   CALCIUM 8.9 04/10/2019 0929   PROT 6.8 04/10/2019 0929   ALBUMIN 3.7 04/10/2019 0929   AST 43 (H) 04/10/2019 0929   AST 24 05/23/2018 0905   ALT 46 (H) 04/10/2019 0929   ALT 49 (H) 05/23/2018 0905   ALKPHOS 70 04/10/2019 0929   BILITOT 0.4 04/10/2019 0929   BILITOT 0.2 (L) 05/23/2018 0905   GFRNONAA >60 04/10/2019 0929   GFRNONAA >60 05/23/2018 0905   GFRNONAA 106 07/27/2017 0851   GFRAA >60 04/10/2019 0929   GFRAA >60 05/23/2018 0905   GFRAA 123 07/27/2017 0851    No results found for: SPEP, UPEP  Lab Results  Component Value Date   WBC 12.8 (H) 04/10/2019   NEUTROABS 10.3 (H) 04/10/2019   HGB 11.3 (L) 04/10/2019   HCT 35.6 (L) 04/10/2019   MCV 91.0 04/10/2019   PLT 444 (H) 04/10/2019      Chemistry      Component Value Date/Time   NA 140 04/10/2019 0929   K 4.1 04/10/2019 0929   CL 102 04/10/2019 0929   CO2 29 04/10/2019  0929   BUN 10 04/10/2019 0929   CREATININE 0.67 04/10/2019 0929   CREATININE 0.71 05/23/2018 0905   CREATININE 0.68 07/27/2017 0851      Component Value Date/Time   CALCIUM 8.9 04/10/2019 0929   ALKPHOS 70 04/10/2019 0929   AST 43 (H) 04/10/2019 0929   AST 24 05/23/2018 0905   ALT 46 (H) 04/10/2019 0929   ALT 49 (H) 05/23/2018 0905   BILITOT 0.4 04/10/2019 0929   BILITOT 0.2 (L) 05/23/2018 0905       RADIOGRAPHIC STUDIES: I have reviewed imaging study with the patient I have personally reviewed the radiological images as listed and agreed with the findings in the report. CT CHEST W CONTRAST  Result Date: 04/18/2019 CLINICAL DATA:  Pancreatic cancer  surveillance, pancreatic cancer, colon cancer, endometrial cancer, Lynch syndrome EXAM: CT CHEST, ABDOMEN, AND PELVIS WITH CONTRAST TECHNIQUE: Multidetector CT imaging of the chest, abdomen and pelvis was performed following the standard protocol during bolus administration of intravenous contrast. CONTRAST:  162m OMNIPAQUE IOHEXOL 300 MG/ML SOLN, additional oral enteric contrast COMPARISON:  CT abdomen pelvis, 01/22/2019, CT chest abdomen pelvis, 09/26/2018 FINDINGS: CT CHEST FINDINGS Cardiovascular: Right chest port catheter. Normal heart size. No pericardial effusion. Mediastinum/Nodes: No enlarged mediastinal, hilar, or axillary lymph nodes. Thyroid gland, trachea, and esophagus demonstrate no significant findings. Lungs/Pleura: Lungs are clear. No pleural effusion or pneumothorax. Musculoskeletal: No chest wall mass or suspicious bone lesions identified. CT ABDOMEN PELVIS FINDINGS Hepatobiliary: No change in a lesion of the anterior right lobe of the liver measuring 2.4 cm with nodular blood pool enhancement, consistent with benign hemangioma (series 7, image 68). Status post cholecystectomy. No biliary dilatation. Pancreas: Status post distal pancreatectomy. There has been a gradual increase in ill-defined soft tissue and celiac axis/SMA  origin lymph nodes adjacent to the distal pancreatectomy margin and lesser curvature of the stomach/gastric body over sequential prior examinations dated 01/22/2019 and 09/26/2018 (series 7, image 92). An enlarged lymph node or soft tissue nodule adjacent to the SMA origin measures 1.8 x 0.8 cm (series 7, image 94). Separately from this finding, there remains a 1.0 cm low-attenuation nodule anterior to the remnant pancreatic neck, most consistent with postoperative seroma (series 2, image 54). Spleen: Status post splenectomy. Adrenals/Urinary Tract: Adrenal glands are unremarkable. Tiny bilateral nonobstructive renal calculi. No hydronephrosis. Bladder is unremarkable. Stomach/Bowel: Stomach is within normal limits. Status post right hemicolectomy with ileocolic anastomosis. No evidence of bowel wall thickening, distention, or inflammatory changes. Vascular/Lymphatic: Minimal aortic atherosclerosis. No enlarged abdominal or pelvic lymph nodes. Reproductive: Status post hysterectomy. Other: Small fat containing hernia component or focal fat necrosis overlying the abdominal wall just right of midline, perhaps related to prior port access site and unchanged compared to prior examination (series 2, image 65). No abdominopelvic ascites. Musculoskeletal: No acute or significant osseous findings. IMPRESSION: 1. Status post distal pancreatectomy and splenectomy. There has been a gradual increase in ill-defined soft tissue and celiac axis/SMA origin lymph nodes adjacent to the distal pancreatectomy margin and lesser curvature of the stomach/gastric body over sequential prior examinations dated 01/22/2019 and 09/26/2018. An enlarged lymph node or soft tissue nodule adjacent to the SMA origin now measures 1.8 x 0.8 cm. Findings are concerning for local recurrence of pancreatic malignancy. 2. Separately from the above findings, there remains a 1.0 cm low-attenuation nodule anterior to the remnant pancreatic neck, most  consistent with postoperative seroma 3. No evidence of distant metastatic disease in the chest, abdomen, or pelvis. 4. Postoperative findings of cholecystectomy, right hemicolectomy, and hysterectomy. 5. Bilateral nonobstructive nephrolithiasis. 6.  Aortic Atherosclerosis (ICD10-I70.0). Electronically Signed   By: AEddie CandleM.D.   On: 04/18/2019 10:39   CT Abdomen Pelvis W Contrast  Result Date: 04/18/2019 CLINICAL DATA:  Pancreatic cancer surveillance, pancreatic cancer, colon cancer, endometrial cancer, Lynch syndrome EXAM: CT CHEST, ABDOMEN, AND PELVIS WITH CONTRAST TECHNIQUE: Multidetector CT imaging of the chest, abdomen and pelvis was performed following the standard protocol during bolus administration of intravenous contrast. CONTRAST:  1026mOMNIPAQUE IOHEXOL 300 MG/ML SOLN, additional oral enteric contrast COMPARISON:  CT abdomen pelvis, 01/22/2019, CT chest abdomen pelvis, 09/26/2018 FINDINGS: CT CHEST FINDINGS Cardiovascular: Right chest port catheter. Normal heart size. No pericardial effusion. Mediastinum/Nodes: No enlarged mediastinal, hilar, or axillary lymph nodes. Thyroid gland, trachea,  and esophagus demonstrate no significant findings. Lungs/Pleura: Lungs are clear. No pleural effusion or pneumothorax. Musculoskeletal: No chest wall mass or suspicious bone lesions identified. CT ABDOMEN PELVIS FINDINGS Hepatobiliary: No change in a lesion of the anterior right lobe of the liver measuring 2.4 cm with nodular blood pool enhancement, consistent with benign hemangioma (series 7, image 68). Status post cholecystectomy. No biliary dilatation. Pancreas: Status post distal pancreatectomy. There has been a gradual increase in ill-defined soft tissue and celiac axis/SMA origin lymph nodes adjacent to the distal pancreatectomy margin and lesser curvature of the stomach/gastric body over sequential prior examinations dated 01/22/2019 and 09/26/2018 (series 7, image 92). An enlarged lymph node or soft  tissue nodule adjacent to the SMA origin measures 1.8 x 0.8 cm (series 7, image 94). Separately from this finding, there remains a 1.0 cm low-attenuation nodule anterior to the remnant pancreatic neck, most consistent with postoperative seroma (series 2, image 54). Spleen: Status post splenectomy. Adrenals/Urinary Tract: Adrenal glands are unremarkable. Tiny bilateral nonobstructive renal calculi. No hydronephrosis. Bladder is unremarkable. Stomach/Bowel: Stomach is within normal limits. Status post right hemicolectomy with ileocolic anastomosis. No evidence of bowel wall thickening, distention, or inflammatory changes. Vascular/Lymphatic: Minimal aortic atherosclerosis. No enlarged abdominal or pelvic lymph nodes. Reproductive: Status post hysterectomy. Other: Small fat containing hernia component or focal fat necrosis overlying the abdominal wall just right of midline, perhaps related to prior port access site and unchanged compared to prior examination (series 2, image 65). No abdominopelvic ascites. Musculoskeletal: No acute or significant osseous findings. IMPRESSION: 1. Status post distal pancreatectomy and splenectomy. There has been a gradual increase in ill-defined soft tissue and celiac axis/SMA origin lymph nodes adjacent to the distal pancreatectomy margin and lesser curvature of the stomach/gastric body over sequential prior examinations dated 01/22/2019 and 09/26/2018. An enlarged lymph node or soft tissue nodule adjacent to the SMA origin now measures 1.8 x 0.8 cm. Findings are concerning for local recurrence of pancreatic malignancy. 2. Separately from the above findings, there remains a 1.0 cm low-attenuation nodule anterior to the remnant pancreatic neck, most consistent with postoperative seroma 3. No evidence of distant metastatic disease in the chest, abdomen, or pelvis. 4. Postoperative findings of cholecystectomy, right hemicolectomy, and hysterectomy. 5. Bilateral nonobstructive  nephrolithiasis. 6.  Aortic Atherosclerosis (ICD10-I70.0). Electronically Signed   By: Eddie Candle M.D.   On: 04/18/2019 10:39

## 2019-04-25 NOTE — Assessment & Plan Note (Addendum)
Her case was discussed at the GI tumor board this morning Consideration to be made for possible EUS/biopsy versus repeat another imaging study in a few weeks She also shared with me the feedback from her surgeon from [facility] who also recommended something similar Clinically, apart from intermittent pain, she appears to be functioning well  After a lot of discussion about various different options, we are in agreement to repeat another CT imaging again on April 5 Delaying biopsy will not jeopardize her health overall Biopsy is necessary due to her multiple malignancies I will reach out to her gastroenterologist to follow her after the CT imaging study If the CT imaging showed persistent disease, we might have to pursue EUS/biopsy to confirm I will repeat her tumor marker again next month when she comes in for port flush and blood work and I will see her back on April 6 to review all test results

## 2019-04-25 NOTE — Assessment & Plan Note (Addendum)
She continues to have abdominal pain especially at nighttime and after meals The differential diagnosis at this point could be due to subacute pancreatitis versus disease relapse She has done extensive dietary modification with low-fat diet She is currently on a taper course of prednisone which she has been on for a long time With dietary modification, she have less abdominal pain She is able to get her pain well controlled with methadone at nighttime for an extended release pain coverage, in addition to intermittent dose of oral Dilaudid during daytime for breakthrough pain She denies constipation with her current medication Overall, she is satisfied with her pain management We discussed narcotic refill policy

## 2019-04-26 ENCOUNTER — Telehealth: Payer: Self-pay | Admitting: Hematology and Oncology

## 2019-04-26 DIAGNOSIS — L570 Actinic keratosis: Secondary | ICD-10-CM | POA: Diagnosis not present

## 2019-04-26 NOTE — Telephone Encounter (Signed)
Scheduled per 3/3 sch msg. Called and left a msg. Mailing printout

## 2019-05-02 ENCOUNTER — Inpatient Hospital Stay: Payer: BC Managed Care – PPO

## 2019-05-02 ENCOUNTER — Encounter: Payer: Self-pay | Admitting: Hematology and Oncology

## 2019-05-02 ENCOUNTER — Telehealth: Payer: Self-pay

## 2019-05-02 ENCOUNTER — Inpatient Hospital Stay (HOSPITAL_BASED_OUTPATIENT_CLINIC_OR_DEPARTMENT_OTHER): Payer: BC Managed Care – PPO | Admitting: Hematology and Oncology

## 2019-05-02 ENCOUNTER — Ambulatory Visit (HOSPITAL_COMMUNITY)
Admission: RE | Admit: 2019-05-02 | Discharge: 2019-05-02 | Disposition: A | Discharge status: Home / Self Care | Payer: BC Managed Care – PPO | Source: Ambulatory Visit | Attending: Hematology and Oncology | Admitting: Hematology and Oncology

## 2019-05-02 ENCOUNTER — Other Ambulatory Visit: Payer: Self-pay

## 2019-05-02 DIAGNOSIS — R509 Fever, unspecified: Secondary | ICD-10-CM

## 2019-05-02 DIAGNOSIS — C541 Malignant neoplasm of endometrium: Secondary | ICD-10-CM

## 2019-05-02 DIAGNOSIS — Z90722 Acquired absence of ovaries, bilateral: Secondary | ICD-10-CM | POA: Diagnosis not present

## 2019-05-02 DIAGNOSIS — Z90710 Acquired absence of both cervix and uterus: Secondary | ICD-10-CM | POA: Diagnosis not present

## 2019-05-02 DIAGNOSIS — Z8542 Personal history of malignant neoplasm of other parts of uterus: Secondary | ICD-10-CM | POA: Diagnosis not present

## 2019-05-02 DIAGNOSIS — C7961 Secondary malignant neoplasm of right ovary: Secondary | ICD-10-CM

## 2019-05-02 DIAGNOSIS — R109 Unspecified abdominal pain: Secondary | ICD-10-CM

## 2019-05-02 DIAGNOSIS — Z9079 Acquired absence of other genital organ(s): Secondary | ICD-10-CM | POA: Diagnosis not present

## 2019-05-02 DIAGNOSIS — Z85038 Personal history of other malignant neoplasm of large intestine: Secondary | ICD-10-CM | POA: Diagnosis not present

## 2019-05-02 DIAGNOSIS — Z791 Long term (current) use of non-steroidal anti-inflammatories (NSAID): Secondary | ICD-10-CM | POA: Diagnosis not present

## 2019-05-02 DIAGNOSIS — Z79899 Other long term (current) drug therapy: Secondary | ICD-10-CM | POA: Diagnosis not present

## 2019-05-02 DIAGNOSIS — Z8543 Personal history of malignant neoplasm of ovary: Secondary | ICD-10-CM | POA: Diagnosis not present

## 2019-05-02 DIAGNOSIS — Z7952 Long term (current) use of systemic steroids: Secondary | ICD-10-CM | POA: Diagnosis not present

## 2019-05-02 DIAGNOSIS — Z7984 Long term (current) use of oral hypoglycemic drugs: Secondary | ICD-10-CM | POA: Diagnosis not present

## 2019-05-02 DIAGNOSIS — C252 Malignant neoplasm of tail of pancreas: Secondary | ICD-10-CM

## 2019-05-02 LAB — URINALYSIS, COMPLETE (UACMP) WITH MICROSCOPIC
Bacteria, UA: NONE SEEN
Bilirubin Urine: NEGATIVE
Glucose, UA: 50 mg/dL — AB
Hgb urine dipstick: NEGATIVE
Ketones, ur: 5 mg/dL — AB
Leukocytes,Ua: NEGATIVE
Nitrite: NEGATIVE
Protein, ur: NEGATIVE mg/dL
Specific Gravity, Urine: 1.026 (ref 1.005–1.030)
pH: 5 (ref 5.0–8.0)

## 2019-05-02 LAB — CBC WITH DIFFERENTIAL/PLATELET
Abs Immature Granulocytes: 0.03 10*3/uL (ref 0.00–0.07)
Basophils Absolute: 0.1 10*3/uL (ref 0.0–0.1)
Basophils Relative: 1 %
Eosinophils Absolute: 0.1 10*3/uL (ref 0.0–0.5)
Eosinophils Relative: 1 %
HCT: 36.8 % (ref 36.0–46.0)
Hemoglobin: 11.6 g/dL — ABNORMAL LOW (ref 12.0–15.0)
Immature Granulocytes: 0 %
Lymphocytes Relative: 19 %
Lymphs Abs: 1.9 10*3/uL (ref 0.7–4.0)
MCH: 28.6 pg (ref 26.0–34.0)
MCHC: 31.5 g/dL (ref 30.0–36.0)
MCV: 90.9 fL (ref 80.0–100.0)
Monocytes Absolute: 1.3 10*3/uL — ABNORMAL HIGH (ref 0.1–1.0)
Monocytes Relative: 13 %
Neutro Abs: 6.6 10*3/uL (ref 1.7–7.7)
Neutrophils Relative %: 66 %
Platelets: 517 10*3/uL — ABNORMAL HIGH (ref 150–400)
RBC: 4.05 MIL/uL (ref 3.87–5.11)
RDW: 17.9 % — ABNORMAL HIGH (ref 11.5–15.5)
WBC: 10 10*3/uL (ref 4.0–10.5)
nRBC: 0 % (ref 0.0–0.2)

## 2019-05-02 LAB — COMPREHENSIVE METABOLIC PANEL
ALT: 16 U/L (ref 0–44)
AST: 13 U/L — ABNORMAL LOW (ref 15–41)
Albumin: 3.4 g/dL — ABNORMAL LOW (ref 3.5–5.0)
Alkaline Phosphatase: 63 U/L (ref 38–126)
Anion gap: 10 (ref 5–15)
BUN: 9 mg/dL (ref 6–20)
CO2: 27 mmol/L (ref 22–32)
Calcium: 9 mg/dL (ref 8.9–10.3)
Chloride: 102 mmol/L (ref 98–111)
Creatinine, Ser: 0.63 mg/dL (ref 0.44–1.00)
GFR calc Af Amer: >60 mL/min (ref >60)
GFR calc non Af Amer: >60 mL/min (ref >60)
Glucose, Bld: 124 mg/dL — ABNORMAL HIGH (ref 70–99)
Potassium: 3.9 mmol/L (ref 3.5–5.1)
Sodium: 139 mmol/L (ref 135–145)
Total Bilirubin: 0.3 mg/dL (ref 0.3–1.2)
Total Protein: 6.8 g/dL (ref 6.5–8.1)

## 2019-05-02 MED ORDER — CIPROFLOXACIN HCL 250 MG PO TABS: 250.0000 mg | ORAL_TABLET | Freq: Two times a day (BID) | ORAL | 0 refills | Status: DC

## 2019-05-02 MED ORDER — HEPARIN SOD (PORK) LOCK FLUSH 100 UNIT/ML IV SOLN
250.0000 [IU] | Freq: Once | INTRAVENOUS | Status: AC
  Administered 2019-05-02: 250 [IU]
  Filled 2019-05-02: qty 5

## 2019-05-02 MED ORDER — SODIUM CHLORIDE 0.9% FLUSH
10.0000 mL | Freq: Once | INTRAVENOUS | Status: AC
  Administered 2019-05-02: 10 mL
  Filled 2019-05-02: qty 10

## 2019-05-02 NOTE — Assessment & Plan Note (Signed)
Her abdominal pain from recent pancreatitis is improved She will continue taking oxycodone and methadone as needed

## 2019-05-02 NOTE — Assessment & Plan Note (Addendum)
The patient is immunocompromised due to chronic immunosuppressive therapy and status post splenectomy Overall, she felt that her symptoms could be due to another UTI We discussed outpatient work-up versus inpatient admission Overall, she feels stable and does not think it is necessary for her to be admitted I recommend she come in to the cancer center to get blood culture drawn through her port, chest x-ray, basic labs, urine culture and urinalysis I will start her on empiric antibiotics with ciprofloxacin She is aware that if she does not get better, she needs to go to the emergency room Otherwise, I will follow-up on her culture results and reassess her symptom in 2 days She is reminded to drink plenty of fluids

## 2019-05-02 NOTE — Assessment & Plan Note (Signed)
She is to get repeat CT imaging schedule next month for further follow-up She have no new concerns in regards to her recent abnormal imaging study In fact, her abdominal pain is slowly improving

## 2019-05-02 NOTE — Progress Notes (Signed)
HEMATOLOGY-ONCOLOGY ELECTRONIC VISIT PROGRESS NOTE  Patient Care Team: [name], MD as PCP - General (Family Medicine)  I connected with the patient via telephone visit today  ASSESSMENT & PLAN:  Fever and chills The patient is immunocompromised due to chronic immunosuppressive therapy and status post splenectomy Overall, she felt that her symptoms could be due to another UTI We discussed outpatient work-up versus inpatient admission Overall, she feels stable and does not think it is necessary for her to be admitted I recommend she come in to the cancer center to get blood culture drawn through her port, chest x-ray, basic labs, urine culture and urinalysis I will start her on empiric antibiotics with ciprofloxacin She is aware that if she does not get better, she needs to go to the emergency room Otherwise, I will follow-up on her culture results and reassess her symptom in 2 days She is reminded to drink plenty of fluids  Abdominal pain Her abdominal pain from recent pancreatitis is improved She will continue taking oxycodone and methadone as needed  Pancreatic cancer [facility]) She is to get repeat CT imaging schedule next month for further follow-up She have no new concerns in regards to her recent abnormal imaging study In fact, her abdominal pain is slowly improving   Orders Placed This Encounter  Procedures  . Culture, blood (single) w Reflex to ID Panel    Standing Status:   Future    Standing Expiration Date:   06/05/2020  . Urine Culture    Standing Status:   Future    Standing Expiration Date:   06/05/2020  . DG Chest 2 View    Standing Status:   Future    Standing Expiration Date:   06/05/2020    Order Specific Question:   Reason for exam:    Answer:   fever and chills, immunocompromised    Order Specific Question:   Preferred imaging location?    Answer:   [facility]  . Urinalysis, Complete w Microscopic    Standing Status:   Future    Standing  Expiration Date:   06/05/2020    INTERVAL HISTORY: Please see below for problem oriented charting. The patient contacted my office due to symptoms of recurrent fever She started with low-grade fever 2 days ago at 100 Last night, her fever got a little worse at 100.6 Fahrenheit This morning, she had fever 101.6 with associated chills She has some mild lower back pain which is different than her recent abdominal pain She thinks she might have recurrent urinary tract infection although she denies dysuria or hematuria She denies cough or sensation of sore throat From the pancreatic cancer follow-up standpoint, overall she is better She is trying to go on clear diet and reduce oral intake She usually only ate dinner Her abdominal pain has improved and she is no longer waking up in the middle of the night with breakthrough pain She does not use much of the methadone for pain.  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 Mixed attenuation mass emanates from the right adnexa measuring 10.8 x 8.0 cm very suspicious for right ovarian carcinoma. 2. Abnormality of the tail of the pancreas may be  due to mild pancreatitis and small pseudocyst formation, but neoplasm cannot be excluded. 3. Small amount of ascites within abdomen and pelvis. 4. Small nonobstructing renal calculi bilaterally.    08/30/2017 Pathology Results   1. Uterus and cervix, with left fallopian tube - ENDOMETRIOID ADENOCARCINOMA, FIGO GRADE I, ARISING IN A BACKGROUND OF DIFFUSE COMPLEX ATYPICAL HYPERPLASIA. - CARCINOMA INVADES FOR OF DEPTH OF 0.2 CM WHERE THICKNESS OF MYOMETRIAL WALL IS 2.1 CM. - ALL  RESECTION MARGINS ARE NEGATIVE FOR CARCINOMA. - NEGATIVE FOR LYMPHOVASCULAR OR PERINEURAL INVASION. - CERVICAL STROMA IS NOT INVOLVED. - SEE ONCOLOGY TABLE. - SEE NOTE 2. Ovary and fallopian tube, right - PRIMARY OVARIAN ENDOMETRIOID ADENOCARCINOMA, FIGO GRADE II, 12 CM. - THE OVARIAN SURFACE IS FOCALLY INVOLVED BY CARCINOMA. - NEGATIVE FOR LYMPHOVASCULAR INVASION. - BENIGN UNREMARKABLE FALLOPIAN TUBE, NEGATIVE FOR CARCINOMA. - SEE ONCOLOGY TABLE. - SEE NOTE 3. Cul-de-sac biopsy - METASTATIC ADENOCARCINOMA, MOST CONSISTENT WITH PRIMARY OVARIAN ENDOMETRIOID ADENOCARCINOMA. 4. Ovary, left - BENIGN UNREMARKABLE OVARY, NEGATIVE FOR MALIGNANCY. Microscopic Comment 1. UTERUS, CARCINOMA OR CARCINOSARCOMA Procedure: Total hysterectomy with bilateral salpingo-oophorectomy. Histologic type: Endometrioid adenocarcinoma. Histologic Grade: FIGO Grade I Myometrial invasion: Depth of invasion: 2 mm Myometrial thickness: 21 mm Uterine Serosa Involvement: Not identified Cervical stromal involvement: Not identified Extent of involvement of other organs: Not applicable Lymphovascular invasion: Not identified Regional Lymph Nodes: Examined: 0 Sentinel 0 Non-sentinel 0 Total Tumor block for ancillary studies: 1H MMR / MSI testing: Pending Pathologic Stage Classification (pTNM, AJCC 8th edition): pT1a, pNX (v4.1.0.0) 2. OVARY or FALLOPIAN TUBE or PRIMARY PERITONEUM: Procedure: Salpingo-oophorectomy Specimen Integrity: Intact Tumor Site: Right ovary Ovarian Surface Involvement (required only if applicable): Focally involved by carcinoma Fallopian Tube Surface Involvement (required only if applicable): Not identified Tumor Size: 12 cm Histologic Type: Endometrioid adenocarcinoma Histologic Grade: Grade II Implants (required for advanced stage serous/seromucinous borderline tumors only): Not applicable Other Tissue/ Organ Involvement: Cul de sac biopsy involved by tumor Largest Extrapelvic  Peritoneal Focus (required only if applicable): Not applicable Peritoneal/Ascitic Fluid: Negative for carcinoma (case # [id number]) Treatment Effect (required only for high-grade serous carcinomas): Not applicable Regional Lymph Nodes: No lymph nodes submitted or found Number of Lymph Nodes Examined: 0 Pathologic Stage Classification (pTNM, AJCC 8th Edition): pT2b, pN0 Representative Tumor Block: 2B and 2E 1. Molecular study for microsatellite instability and immunohistochemical stains for MMR-related proteins are pending and will be reported in an addendum. 2. Immunohistochemical stain show that the ovarian tumor is positive for CK7 and PAX8 (both diffuse), CDX2 (patchy and weak); and negative for CK20. This immunoprofile is consistent with the above diagnosis. Dr. Lyndon Code has reviewed this case and concurs with the above diagnosis. Molecular study for microsatellite instability and immunohistochemical stains for MMR-related proteins are pending and will be reported in an addendum   08/30/2017 Genetic Testing   Patient has genetic testing done for MSI  Results revealed patient has the following mutation(s): loss of [facility] 2   08/30/2017 Surgery   Surgeon: Mart Piggs, MD Pre-operative Diagnosis:  1. Adnexal mass 2. Abnormal uterine bleeding 3. H/o Cecal CA  Post-operative Diagnosis:  1. Adhesive disease post colon resection 2. Endometrial cancer NOS 3. Adenocarcinoma unknown origin, right ovary, suspicious for GI primary  Operation:  1. Lysis of adhesions ~30 minutes 2. Robotic-assisted laparoscopic total hysterectomy with right salpingo-oophorectomy and left salpingectomy 3. Left oophorectomy (RA-laparoscopic) 4. Pelvic washings  Findings: Adhesions of omentum to anterior abdominal wall. Enlarged cystic right ovary ~10cm. Uterus had small nodules on serosa near where  right adnexa was intimate with the surface. No obvious intraoperative rupture of cyst, although in 2 areas the  wall was thin and one of these areas had some bleeding. Slight scarring of left bladder dome to LUS/cervix. Uterus on frozen section c/w hyperplasia and a small focus of endometrial CA - no myo invasion, <2cm in size. Frozen section on the right adnexa was carcinoma, met from colon or possibly Gyn, favor GI primary, defer to permanent. Left ovary was WNL.     09/15/2017 Cancer Staging   Staging form: Corpus Uteri - Carcinoma and Carcinosarcoma, AJCC 8th Edition - Pathologic: FIGO Stage IA (pT1a, pN0, cM0) - Signed by Heath Lark, MD on 09/15/2017   09/26/2017 Procedure   Successful placement of a right internal jugular approach power injectable Port-A-Cath. The catheter is ready for immediate use.   11/07/2017 Imaging   1. Since 08/18/2017, similar to slight decrease in size of a pancreatic body/tail junction lesion. Cross modality comparison relative to 09/16/2017 MRI is also grossly similar. 2. No evidence of metastatic disease. 3. Aortic Atherosclerosis (ICD10-I70.0).  4. Left nephrolithiasis.   11/18/2017 Genetic Testing   MSH2 c.2237dupT pathogenic mutation identified in the CancerNext panel.  The CancerNext gene panel offered by [employer] includes sequencing and rearrangement analysis for the following 34 genes:   APC, ATM, BARD1, BMPR1A, BRCA1, BRCA2, BRIP1, CDH1, CDK4, CDKN2A, CHEK2, DICER1, HOXB13, EPCAM, GREM1, MLH1, MRE11A, MSH2, MSH6, MUTYH, NBN, NF1, PALB2, PMS2, POLD1, POLE, PTEN, RAD50, RAD51C, RAD51D, SMAD4, SMARCA4, STK11, and TP53.  The report date is November 18, 2017.  MSH2 [id number] pathogenic mutation identified on somatic testing.  These results are consistent with a diagnosis of Lynch syndrome.   Secondary malignant neoplasm of right ovary ([place])  08/23/2017 Tumor Marker   Patient's tumor was tested for the following markers: CA-125 Results of the tumor marker test revealed 139.8   08/31/2017 Initial Diagnosis   Secondary malignant neoplasm of right ovary  ([place])   09/15/2017 Cancer Staging   Staging form: Ovary, Fallopian Tube, and Primary Peritoneal Carcinoma, AJCC 8th Edition - Pathologic: Stage IIB (pT2b, pN0, cM0) - Signed by Heath Lark, MD on 09/15/2017   09/27/2017 Imaging   No evidence of metastatic disease or other acute findings within the thorax.  4 cm low-attenuation mass in pancreatic tail, highly suspicious for pancreatic carcinoma. This is caused splenic vein thrombosis, with new venous collaterals in the left upper quadrant. Consider endoscopic ultrasound with FNA for tissue diagnosis.  Stable benign hepatic hemangioma.    09/27/2017 Tumor Marker   Patient's tumor was tested for the following markers: CA-125 Results of the tumor marker test revealed 39.4   11/02/2017 Tumor Marker   Patient's tumor was tested for the following markers: CA-125 Results of the tumor marker test revealed 19   11/07/2017 Tumor Marker   Patient's tumor was tested for the following markers: CA-125 Results of the tumor marker test revealed 18.3   11/18/2017 Genetic Testing   MSH2 c.2237dupT pathogenic mutation identified in the CancerNext panel.  The CancerNext gene panel offered by [employer] includes sequencing and rearrangement analysis for the following 34 genes:   APC, ATM, BARD1, BMPR1A, BRCA1, BRCA2, BRIP1, CDH1, CDK4, CDKN2A, CHEK2, DICER1, HOXB13, EPCAM, GREM1, MLH1, MRE11A, MSH2, MSH6, MUTYH, NBN, NF1, PALB2, PMS2, POLD1, POLE, PTEN, RAD50, RAD51C, RAD51D, SMAD4, SMARCA4, STK11, and TP53.  The report date is November 18, 2017.  MSH2 [id number] pathogenic mutation identified on somatic testing.  These results are consistent with a diagnosis of Lynch  syndrome.   12/15/2017 Tumor Marker   Patient's tumor was tested for the following markers: CA-125 Results of the tumor marker test revealed 57.3   01/16/2018 Tumor Marker   Patient's tumor was tested for the following markers: CA-125 Results of the tumor marker test revealed  24.2   04/10/2018 Tumor Marker   Patient's tumor was tested for the following markers: CA-125 Results of the tumor marker test revealed 18.2   07/12/2018 Tumor Marker   Patient's tumor was tested for the following markers: CA-125 Results of the tumor marker test revealed 11.8   10/16/2018 Tumor Marker   Patient's tumor was tested for the following markers: CA125 Results of the tumor marker test revealed 12.4.   Pancreatic cancer ([place])  10/13/2017 Pathology Results   Pancreas tail mass, endoscopic ultrasound-guided, fine needle aspiration II (smears and cell block): Adenocarcinoma   11/18/2017 Genetic Testing   MSH2 c.[id number] pathogenic mutation identified in the CancerNext panel.  The CancerNext gene panel offered by [employer] includes sequencing and rearrangement analysis for the following 34 genes:   APC, ATM, BARD1, BMPR1A, BRCA1, BRCA2, BRIP1, CDH1, CDK4, CDKN2A, CHEK2, DICER1, HOXB13, EPCAM, GREM1, MLH1, MRE11A, MSH2, MSH6, MUTYH, NBN, NF1, PALB2, PMS2, POLD1, POLE, PTEN, RAD50, RAD51C, RAD51D, SMAD4, SMARCA4, STK11, and TP53.  The report date is November 18, 2017.  MSH2 [id number] pathogenic mutation identified on somatic testing.  These results are consistent with a diagnosis of Lynch syndrome.   11/24/2017 Pathology Results   A. "TAIL OF PANCREAS AND SPLEEN", DISTAL PANCREATECTOMY AND SPLENECTOMY: Invasive ductal adenocarcinoma, moderately to poorly differentiated with focal signet ring cell features, of pancreas (distal).   The carcinoma is 2.5 cm in greatest dimension grossly.   Treatment effects present in the form of fibrosis (50%). No lymphovascular or definite perineural invasion identified. All surgical margins are negative for tumor or high-grade dysplasia. Adjacent mucinous neoplasm, most consistent with Intraductal papillary mucinous neoplasm (IPMN) with low-grade dysplasia.   Uninvolved pancreas show atrophy  and focal acute inflammation.   Twelve benign lymph nodes (0/12). Spleen with no significant histopathologic abnormalities.  PROCEDURE: distal pancreatectomy and splenectomy TUMOR SITE: distal pancreas TUMOR SIZE:  GREATEST DIMENSION: 2.5 cm  ADDITIONAL DIMENSIONS:  x  cm HISTOLOGIC TYPE: ductal adenocarcinoma HISTOLOGIC GRADE: grade 3 TUMOR EXTENSION: peripancreatic soft tissue MARGINS: negative for tumor TREATMENT EFFECT: treatment effects present in the form of fibrosis (50%). LYMPHOVASCULAR INVASION: not identified PERINEURAL INVASION: no definite evidence REGIONAL LYMPH NODES:   NUMBER OF LYMPH NODES INVOLVED: 0   NUMBER OF LYMPH NODES EXAMINED: 12 PATHOLOGIC STAGE CLASSIFICATION (pTNM, AJCC 8th Ed): ypT2, ypN0 DISTANT METASTASIS (pM): pMx ADDITIONAL PATHOLOGIC FINDINGS: mucinous neoplasm, most consistent with intraductal papillary mucinous neoplasm (IPMN) with low-grade dysplasia, is identified adjacent to the main tumor.   11/24/2017 Cancer Staging   Staging form: Exocrine Pancreas, AJCC 8th Edition - Pathologic stage from 11/24/2017: Stage IB (pT2, pN0, cM0) - Signed by Truitt Merle, MD on 01/03/2018   11/25/2017 Surgery   She had surgery at [facility] 1. Exploratory Laparotomy 2. Distal Pancreatectomy and Splenectomy 3. Intraoperative Ultrasound 4. Open Cholecystectomy    12/15/2017 Cancer Staging   Staging form: Exocrine Pancreas, AJCC 8th Edition - Clinical: Stage IB (cT2, cN0, cM0) - Signed by Heath Lark, MD on 12/15/2017   12/28/2017 Imaging   12/28/2017 CT Abdomen  IMPRESSION: 1. Postoperative findings from recent partial pancreatectomy including a 21 cubic cm fluid collection along the pancreatic resection margin which could represent early pseudocyst. 2. Nodularity along the  lateral limb of the left adrenal gland could also be postoperative but merit surveillance, as the pancreatic lesion was in close proximity to this adrenal gland on the  prior CT of 11/07/2017. 3. Asymmetric fullness inferiorly in the left breast. The patient has a history of prior left breast procedures, correlation with mammography is recommended. 4. Other imaging findings of potential clinical significance: Stable hemangioma in the left hepatic lobe. Splenectomy. Aortic Atherosclerosis (ICD10-I70.0). Right hemicolectomy. Small focus of fat necrosis in the right anterior abdominal wall subcutaneous tissues near the laparotomy site. Bilateral nonobstructive nephrolithiasis.   01/02/2018 Tumor Marker   Patient's tumor was tested for the following markers: CA-19-9 Results of the tumor marker test revealed 5   01/03/2018 - 06/05/2018 Chemotherapy   She received modified dose FOLFIRINOX   04/10/2018 Imaging   1. Distal pancreatectomy, without findings of recurrent or metastatic disease. 2. Incompletely imaged hypoenhancement within lower lobe right pulmonary artery branch is likely chronic (but interval since 10/09/2017) pulmonary embolism. Dedicated CTA could further evaluate. 3. Decrease in size of a peripancreatic complex fluid collection anteriorly, likely a resolving pseudocyst. 4. Decreased size of minimal fluid versus a borderline sized node in the gastrohepatic ligament. Recommend attention on follow-up. 5. Hepatic steatosis with a segment 4 hemangioma. 6. Aortic Atherosclerosis (ICD10-I70.0). This is significantly age advanced.   07/12/2018 Tumor Marker   Patient's tumor was tested for the following markers: CA-19-9 Results of the tumor marker test revealed 6   07/12/2018 Imaging   1. Status post distal pancreatectomy with stable postoperative fluid collection adjacent to the ventral aspect of the pancreatic head. No findings to suggest metastatic disease in the abdomen or pelvis. 2. Hepatic steatosis with small cavernous hemangioma in segment 4A of the liver. 3. Slight decreased size of nonenlarged gastrohepatic ligament lymph node, presumably  benign. 4. Aortic atherosclerosis.    10/16/2018 Imaging   Status post distal pancreatectomy. Stable postoperative seroma along the surgical margin.   Status post hysterectomy and right oophorectomy.   Status post right hemicolectomy with appendectomy.   No evidence of recurrent or metastatic disease.   No colonic wall thickening or mass is evident on CT.   01/22/2019 Tumor Marker   Patient's tumor was tested for the following markers: CA-19.9 Results of the tumor marker test revealed 5   01/22/2019 Imaging   1. No evidence of local recurrence or metastatic disease status post distal pancreatectomy and splenectomy. 2. Stable small seroma anterior to the pancreatic head. 3. Postsurgical changes as described. 4. Stable additional incidental findings including a hepatic hemangioma, nonobstructing bilateral renal calculi and aortic Atherosclerosis (ICD10-I70.0).   04/18/2019 Imaging   1. Status post distal pancreatectomy and splenectomy. There has been a gradual increase in ill-defined soft tissue and celiac axis/SMA origin lymph nodes adjacent to the distal pancreatectomy margin and lesser curvature of the stomach/gastric body over sequential prior examinations dated 01/22/2019 and 09/26/2018. An enlarged lymph node or soft tissue nodule adjacent to the SMA origin now measures 1.8 x 0.8 cm. Findings are concerning for local recurrence of pancreatic malignancy. 2. Separately from the above findings, there remains a 1.0 cm low-attenuation nodule anterior to the remnant pancreatic neck, most consistent with postoperative seroma 3. No evidence of distant metastatic disease in the chest, abdomen, or pelvis. 4. Postoperative findings of cholecystectomy, right hemicolectomy, and hysterectomy. 5. Bilateral nonobstructive nephrolithiasis. 6.  Aortic Atherosclerosis (ICD10-I70.0).         REVIEW OF SYSTEMS:   Eyes: Denies blurriness of vision Ears, nose, mouth,  throat, and face: Denies  mucositis or sore throat Respiratory: Denies cough, dyspnea or wheezes Cardiovascular: Denies palpitation, chest discomfort Skin: Denies abnormal skin rashes Lymphatics: Denies new lymphadenopathy or easy bruising Neurological:Denies numbness, tingling or new weaknesses Behavioral/Psych: Mood is stable, no new changes  Extremities: No lower extremity edema All other systems were reviewed with the patient and are negative.  I have reviewed the past medical history, past surgical history, social history and family history with the patient and they are unchanged from previous note.  ALLERGIES:  is allergic to codeine; penicillins; and bactrim [sulfamethoxazole-trimethoprim].  MEDICATIONS:  Current Outpatient Medications  Medication Sig Dispense Refill  . Cetirizine HCl (ZYRTEC PO) Take by mouth.    . ciprofloxacin (CIPRO) 250 MG tablet Take 1 tablet (250 mg total) by mouth 2 (two) times daily. 10 tablet 0  . citalopram (CELEXA) 20 MG tablet Take 1 tablet (20 mg total) by mouth daily. 30 tablet 11  . CREON 24000-76000 units CPEP TAKE BY MOUTH 1 CAP BEFORE EACH MEAL AND 1 CAP AFTER EACH MEAL AND 1 CAP WITH SNACKS.    . diclofenac Sodium (VOLTAREN) 1 % GEL Apply 2-4 grams to affected joint 4 times daily as needed. 400 g 1  . estradiol (ESTRACE VAGINAL) 0.1 MG/GM vaginal cream Place 1 Applicatorful vaginally 3 (three) times a week. 42.5 g 12  . fluticasone (FLONASE) 50 MCG/ACT nasal spray Place 1 spray into both nostrils daily.    . folic acid (FOLVITE) 1 MG tablet Take 2 tablets (2 mg total) by mouth daily. 180 tablet 3  . gabapentin (NEURONTIN) 300 MG capsule Take 1 capsule (300 mg total) by mouth 3 (three) times daily. 90 capsule 3  . HYDROmorphone (DILAUDID) 4 MG tablet Take 1 tablet (4 mg total) by mouth every 6 (six) hours as needed for severe pain. 60 tablet 0  . hydrOXYzine (ATARAX/VISTARIL) 10 MG tablet Take 10 mg by mouth 3 (three) times daily as needed.    . lidocaine-prilocaine  (EMLA) cream Apply 1 application topically daily as needed (port). Apply to affected area once 30 g 11  . metFORMIN (GLUCOPHAGE) 500 MG tablet TAKE 1 TABLET (500 MG TOTAL) BY MOUTH 2 (TWO) TIMES DAILY WITH A MEAL. (Patient taking differently: Take 500 mg by mouth 2 (two) times daily with a meal. ) 60 tablet 1  . methadone (DOLOPHINE) 10 MG tablet Take 2 tablets (20 mg total) by mouth every 12 (twelve) hours. 30 tablet 0  . methocarbamol (ROBAXIN) 500 MG tablet TAKE 1 TABLET BY MOUTH 3 TIMES DAILY AS NEEDED FOR MUSCLE SPASMS. 90 tablet 0  . Multiple Vitamin (MULTIVITAMIN WITH MINERALS) TABS tablet Take 1 tablet by mouth daily.    Marland Kitchen omeprazole (PRILOSEC) 40 MG capsule Take 1 capsule (40 mg total) by mouth daily. 30 capsule 11  . ORENCIA CLICKJECT 620 MG/ML SOAJ INJECT ONE CLICKJECT PEN SUBCUTANEOUSLY EVERY WEEK. REFRIGERATE. ALLOW TO WARM TO ROOM TEMPERATURE PRIOR TO ADMINISTRATION. 12 mL 0  . OTREXUP 20 MG/0.4ML SOAJ INJECT ONE PEN SUBCUTANEOUSLY ONCE EVERY WEEK. STORE AT ROOM TEMPERATURE BETWEEN 68 - 77 DEGREES F. 12 pen 0  . predniSONE (DELTASONE) 1 MG tablet Take 4 tablets (4 mg total) by mouth daily with breakfast. 240 tablet 0  . simethicone (MYLICON) 355 MG chewable tablet Chew 125 mg by mouth every 6 (six) hours as needed for flatulence.    . traMADol (ULTRAM) 50 MG tablet Take 1 tablet (50 mg total) by mouth every 6 (six) hours as needed  for moderate pain. 60 tablet 0   No current facility-administered medications for this visit.    PHYSICAL EXAMINATION: ECOG PERFORMANCE STATUS: 1 - Symptomatic but completely ambulatory  LABORATORY DATA:  I have reviewed the data as listed CMP Latest Ref Rng & Units 04/10/2019 01/22/2019 12/07/2018  Glucose 70 - 99 mg/dL 91 94 115(H)  BUN 6 - 20 mg/dL '10 14 18  ' Creatinine 0.44 - 1.00 mg/dL 0.67 0.65 0.69  Sodium 135 - 145 mmol/L 140 140 140  Potassium 3.5 - 5.1 mmol/L 4.1 4.2 3.7  Chloride 98 - 111 mmol/L 102 104 103  CO2 22 - 32 mmol/L '29 24 26   ' Calcium 8.9 - 10.3 mg/dL 8.9 9.2 9.2  Total Protein 6.5 - 8.1 g/dL 6.8 7.2 7.1  Total Bilirubin 0.3 - 1.2 mg/dL 0.4 <0.2(L) <0.2(L)  Alkaline Phos 38 - 126 U/L 70 66 117  AST 15 - 41 U/L 43(H) 22 16  ALT 0 - 44 U/L 46(H) 23 34    Lab Results  Component Value Date   WBC 12.8 (H) 04/10/2019   HGB 11.3 (L) 04/10/2019   HCT 35.6 (L) 04/10/2019   MCV 91.0 04/10/2019   PLT 444 (H) 04/10/2019   NEUTROABS 10.3 (H) 04/10/2019     RADIOGRAPHIC STUDIES: I have personally reviewed the radiological images as listed and agreed with the findings in the report. CT CHEST W CONTRAST  Result Date: 04/18/2019 CLINICAL DATA:  Pancreatic cancer surveillance, pancreatic cancer, colon cancer, endometrial cancer, Lynch syndrome EXAM: CT CHEST, ABDOMEN, AND PELVIS WITH CONTRAST TECHNIQUE: Multidetector CT imaging of the chest, abdomen and pelvis was performed following the standard protocol during bolus administration of intravenous contrast. CONTRAST:  180m OMNIPAQUE IOHEXOL 300 MG/ML SOLN, additional oral enteric contrast COMPARISON:  CT abdomen pelvis, 01/22/2019, CT chest abdomen pelvis, 09/26/2018 FINDINGS: CT CHEST FINDINGS Cardiovascular: Right chest port catheter. Normal heart size. No pericardial effusion. Mediastinum/Nodes: No enlarged mediastinal, hilar, or axillary lymph nodes. Thyroid gland, trachea, and esophagus demonstrate no significant findings. Lungs/Pleura: Lungs are clear. No pleural effusion or pneumothorax. Musculoskeletal: No chest wall mass or suspicious bone lesions identified. CT ABDOMEN PELVIS FINDINGS Hepatobiliary: No change in a lesion of the anterior right lobe of the liver measuring 2.4 cm with nodular blood pool enhancement, consistent with benign hemangioma (series 7, image 68). Status post cholecystectomy. No biliary dilatation. Pancreas: Status post distal pancreatectomy. There has been a gradual increase in ill-defined soft tissue and celiac axis/SMA origin lymph nodes adjacent  to the distal pancreatectomy margin and lesser curvature of the stomach/gastric body over sequential prior examinations dated 01/22/2019 and 09/26/2018 (series 7, image 92). An enlarged lymph node or soft tissue nodule adjacent to the SMA origin measures 1.8 x 0.8 cm (series 7, image 94). Separately from this finding, there remains a 1.0 cm low-attenuation nodule anterior to the remnant pancreatic neck, most consistent with postoperative seroma (series 2, image 54). Spleen: Status post splenectomy. Adrenals/Urinary Tract: Adrenal glands are unremarkable. Tiny bilateral nonobstructive renal calculi. No hydronephrosis. Bladder is unremarkable. Stomach/Bowel: Stomach is within normal limits. Status post right hemicolectomy with ileocolic anastomosis. No evidence of bowel wall thickening, distention, or inflammatory changes. Vascular/Lymphatic: Minimal aortic atherosclerosis. No enlarged abdominal or pelvic lymph nodes. Reproductive: Status post hysterectomy. Other: Small fat containing hernia component or focal fat necrosis overlying the abdominal wall just right of midline, perhaps related to prior port access site and unchanged compared to prior examination (series 2, image 65). No abdominopelvic ascites. Musculoskeletal: No acute or significant  osseous findings. IMPRESSION: 1. Status post distal pancreatectomy and splenectomy. There has been a gradual increase in ill-defined soft tissue and celiac axis/SMA origin lymph nodes adjacent to the distal pancreatectomy margin and lesser curvature of the stomach/gastric body over sequential prior examinations dated 01/22/2019 and 09/26/2018. An enlarged lymph node or soft tissue nodule adjacent to the SMA origin now measures 1.8 x 0.8 cm. Findings are concerning for local recurrence of pancreatic malignancy. 2. Separately from the above findings, there remains a 1.0 cm low-attenuation nodule anterior to the remnant pancreatic neck, most consistent with postoperative seroma  3. No evidence of distant metastatic disease in the chest, abdomen, or pelvis. 4. Postoperative findings of cholecystectomy, right hemicolectomy, and hysterectomy. 5. Bilateral nonobstructive nephrolithiasis. 6.  Aortic Atherosclerosis (ICD10-I70.0). Electronically Signed   By: Eddie Candle M.D.   On: 04/18/2019 10:39   CT Abdomen Pelvis W Contrast  Result Date: 04/18/2019 CLINICAL DATA:  Pancreatic cancer surveillance, pancreatic cancer, colon cancer, endometrial cancer, Lynch syndrome EXAM: CT CHEST, ABDOMEN, AND PELVIS WITH CONTRAST TECHNIQUE: Multidetector CT imaging of the chest, abdomen and pelvis was performed following the standard protocol during bolus administration of intravenous contrast. CONTRAST:  115m OMNIPAQUE IOHEXOL 300 MG/ML SOLN, additional oral enteric contrast COMPARISON:  CT abdomen pelvis, 01/22/2019, CT chest abdomen pelvis, 09/26/2018 FINDINGS: CT CHEST FINDINGS Cardiovascular: Right chest port catheter. Normal heart size. No pericardial effusion. Mediastinum/Nodes: No enlarged mediastinal, hilar, or axillary lymph nodes. Thyroid gland, trachea, and esophagus demonstrate no significant findings. Lungs/Pleura: Lungs are clear. No pleural effusion or pneumothorax. Musculoskeletal: No chest wall mass or suspicious bone lesions identified. CT ABDOMEN PELVIS FINDINGS Hepatobiliary: No change in a lesion of the anterior right lobe of the liver measuring 2.4 cm with nodular blood pool enhancement, consistent with benign hemangioma (series 7, image 68). Status post cholecystectomy. No biliary dilatation. Pancreas: Status post distal pancreatectomy. There has been a gradual increase in ill-defined soft tissue and celiac axis/SMA origin lymph nodes adjacent to the distal pancreatectomy margin and lesser curvature of the stomach/gastric body over sequential prior examinations dated 01/22/2019 and 09/26/2018 (series 7, image 92). An enlarged lymph node or soft tissue nodule adjacent to the SMA  origin measures 1.8 x 0.8 cm (series 7, image 94). Separately from this finding, there remains a 1.0 cm low-attenuation nodule anterior to the remnant pancreatic neck, most consistent with postoperative seroma (series 2, image 54). Spleen: Status post splenectomy. Adrenals/Urinary Tract: Adrenal glands are unremarkable. Tiny bilateral nonobstructive renal calculi. No hydronephrosis. Bladder is unremarkable. Stomach/Bowel: Stomach is within normal limits. Status post right hemicolectomy with ileocolic anastomosis. No evidence of bowel wall thickening, distention, or inflammatory changes. Vascular/Lymphatic: Minimal aortic atherosclerosis. No enlarged abdominal or pelvic lymph nodes. Reproductive: Status post hysterectomy. Other: Small fat containing hernia component or focal fat necrosis overlying the abdominal wall just right of midline, perhaps related to prior port access site and unchanged compared to prior examination (series 2, image 65). No abdominopelvic ascites. Musculoskeletal: No acute or significant osseous findings. IMPRESSION: 1. Status post distal pancreatectomy and splenectomy. There has been a gradual increase in ill-defined soft tissue and celiac axis/SMA origin lymph nodes adjacent to the distal pancreatectomy margin and lesser curvature of the stomach/gastric body over sequential prior examinations dated 01/22/2019 and 09/26/2018. An enlarged lymph node or soft tissue nodule adjacent to the SMA origin now measures 1.8 x 0.8 cm. Findings are concerning for local recurrence of pancreatic malignancy. 2. Separately from the above findings, there remains a 1.0 cm low-attenuation nodule anterior  to the remnant pancreatic neck, most consistent with postoperative seroma 3. No evidence of distant metastatic disease in the chest, abdomen, or pelvis. 4. Postoperative findings of cholecystectomy, right hemicolectomy, and hysterectomy. 5. Bilateral nonobstructive nephrolithiasis. 6.  Aortic Atherosclerosis  (ICD10-I70.0). Electronically Signed   By: Eddie Candle M.D.   On: 04/18/2019 10:39    I discussed the assessment and treatment plan with the patient. The patient was provided an opportunity to ask questions and all were answered. The patient agreed with the plan and demonstrated an understanding of the instructions. The patient was advised to call back or seek an in-person evaluation if the symptoms worsen or if the condition fails to improve as anticipated.    I spent 30 minutes for the appointment reviewing test results, discuss management and coordination of care.  Heath Lark, MD 05/02/2019 9:33 AM

## 2019-05-02 NOTE — Telephone Encounter (Signed)
Telephone appt visit scheduled.

## 2019-05-03 LAB — URINE CULTURE: Culture: NO GROWTH

## 2019-05-03 LAB — CANCER ANTIGEN 19-9: CA 19-9: 17 U/mL (ref 0–35)

## 2019-05-04 ENCOUNTER — Encounter: Payer: Self-pay | Admitting: Hematology and Oncology

## 2019-05-04 ENCOUNTER — Telehealth: Payer: Self-pay

## 2019-05-04 NOTE — Telephone Encounter (Signed)
-----   Message from Heath Lark, MD sent at 05/04/2019  7:17 AM EST ----- Regarding: can you call qnd ask how she is? anymore fever? cultures are all negative so far

## 2019-05-04 NOTE — Telephone Encounter (Signed)
Called and given below message. She verbalized understanding. 

## 2019-05-04 NOTE — Telephone Encounter (Signed)
I recommend she proceed to finish all the prescribed antibiotics

## 2019-05-04 NOTE — Telephone Encounter (Signed)
Called and given below message. She verbalized understanding. She feels good today and is going to work. No fever this am or Thursday. She thinks she had a low grade fever last night, she did not check her temperature. She took tylenol last night. She is complaining of mid right back pain, that started yesterday. She thinks that it is muscle pain and took a muscle relaxer.

## 2019-05-04 NOTE — Telephone Encounter (Signed)
Called and left below message. Ask her to call the office back. 

## 2019-05-07 ENCOUNTER — Encounter: Payer: Self-pay | Admitting: Hematology and Oncology

## 2019-05-07 ENCOUNTER — Telehealth: Payer: Self-pay

## 2019-05-07 NOTE — Telephone Encounter (Signed)
Called and given below message. She verbalized understanding.She is feeling better today. She is just is feeling tired today and thinks that it is from the antibiotic. She finished up the antibiotic yesterday. She had a temp of 99.3 Saturday night. No fever since then. Having abdominal pain and taking pain medication. She has scheduled her covid vaccine for 3/20. She will call the office if needed.

## 2019-05-07 NOTE — Telephone Encounter (Signed)
-----   Message from Heath Lark, MD sent at 05/07/2019 10:16 AM EDT ----- Regarding: cultures are neg Pls let her know all cultures are negative I hope she is feeling better

## 2019-05-08 ENCOUNTER — Encounter: Payer: Self-pay | Admitting: Hematology and Oncology

## 2019-05-09 LAB — CULTURE, BLOOD (SINGLE): Culture: NO GROWTH

## 2019-05-28 ENCOUNTER — Inpatient Hospital Stay: Payer: BC Managed Care – PPO | Attending: Hematology and Oncology

## 2019-05-28 ENCOUNTER — Other Ambulatory Visit: Payer: Self-pay

## 2019-05-28 ENCOUNTER — Encounter (HOSPITAL_COMMUNITY): Payer: Self-pay

## 2019-05-28 ENCOUNTER — Ambulatory Visit (HOSPITAL_COMMUNITY)
Admission: RE | Admit: 2019-05-28 | Discharge: 2019-05-28 | Disposition: A | Discharge status: Home / Self Care | Payer: BC Managed Care – PPO | Source: Ambulatory Visit | Attending: Hematology and Oncology | Admitting: Hematology and Oncology

## 2019-05-28 ENCOUNTER — Inpatient Hospital Stay: Payer: BC Managed Care – PPO

## 2019-05-28 ENCOUNTER — Other Ambulatory Visit: Payer: BC Managed Care – PPO

## 2019-05-28 DIAGNOSIS — C541 Malignant neoplasm of endometrium: Secondary | ICD-10-CM

## 2019-05-28 DIAGNOSIS — G620 Drug-induced polyneuropathy: Secondary | ICD-10-CM | POA: Insufficient documentation

## 2019-05-28 DIAGNOSIS — M069 Rheumatoid arthritis, unspecified: Secondary | ICD-10-CM | POA: Diagnosis not present

## 2019-05-28 DIAGNOSIS — C252 Malignant neoplasm of tail of pancreas: Secondary | ICD-10-CM

## 2019-05-28 DIAGNOSIS — Z5111 Encounter for antineoplastic chemotherapy: Secondary | ICD-10-CM | POA: Diagnosis not present

## 2019-05-28 DIAGNOSIS — R109 Unspecified abdominal pain: Secondary | ICD-10-CM | POA: Diagnosis not present

## 2019-05-28 DIAGNOSIS — C7961 Secondary malignant neoplasm of right ovary: Secondary | ICD-10-CM | POA: Insufficient documentation

## 2019-05-28 DIAGNOSIS — R634 Abnormal weight loss: Secondary | ICD-10-CM | POA: Insufficient documentation

## 2019-05-28 DIAGNOSIS — Z79899 Other long term (current) drug therapy: Secondary | ICD-10-CM | POA: Insufficient documentation

## 2019-05-28 LAB — COMPREHENSIVE METABOLIC PANEL
ALT: 21 U/L (ref 0–44)
AST: 19 U/L (ref 15–41)
Albumin: 3.2 g/dL — ABNORMAL LOW (ref 3.5–5.0)
Alkaline Phosphatase: 88 U/L (ref 38–126)
Anion gap: 14 (ref 5–15)
BUN: 10 mg/dL (ref 6–20)
CO2: 26 mmol/L (ref 22–32)
Calcium: 9.3 mg/dL (ref 8.9–10.3)
Chloride: 100 mmol/L (ref 98–111)
Creatinine, Ser: 0.64 mg/dL (ref 0.44–1.00)
GFR calc Af Amer: >60 mL/min (ref >60)
GFR calc non Af Amer: >60 mL/min (ref >60)
Glucose, Bld: 104 mg/dL — ABNORMAL HIGH (ref 70–99)
Potassium: 4.2 mmol/L (ref 3.5–5.1)
Sodium: 140 mmol/L (ref 135–145)
Total Bilirubin: <0.2 mg/dL — ABNORMAL LOW (ref 0.3–1.2)
Total Protein: 7.2 g/dL (ref 6.5–8.1)

## 2019-05-28 LAB — CBC WITH DIFFERENTIAL/PLATELET
Abs Immature Granulocytes: 0.04 10*3/uL (ref 0.00–0.07)
Basophils Absolute: 0 10*3/uL (ref 0.0–0.1)
Basophils Relative: 0 %
Eosinophils Absolute: 1 10*3/uL — ABNORMAL HIGH (ref 0.0–0.5)
Eosinophils Relative: 8 %
HCT: 36.6 % (ref 36.0–46.0)
Hemoglobin: 11.5 g/dL — ABNORMAL LOW (ref 12.0–15.0)
Immature Granulocytes: 0 %
Lymphocytes Relative: 18 %
Lymphs Abs: 2 10*3/uL (ref 0.7–4.0)
MCH: 28.4 pg (ref 26.0–34.0)
MCHC: 31.4 g/dL (ref 30.0–36.0)
MCV: 90.4 fL (ref 80.0–100.0)
Monocytes Absolute: 1.1 10*3/uL — ABNORMAL HIGH (ref 0.1–1.0)
Monocytes Relative: 9 %
Neutro Abs: 7.4 10*3/uL (ref 1.7–7.7)
Neutrophils Relative %: 65 %
Platelets: 658 10*3/uL — ABNORMAL HIGH (ref 150–400)
RBC: 4.05 MIL/uL (ref 3.87–5.11)
RDW: 17 % — ABNORMAL HIGH (ref 11.5–15.5)
WBC: 11.6 10*3/uL — ABNORMAL HIGH (ref 4.0–10.5)
nRBC: 0 % (ref 0.0–0.2)

## 2019-05-28 MED ORDER — HEPARIN SOD (PORK) LOCK FLUSH 100 UNIT/ML IV SOLN
INTRAVENOUS | Status: AC
  Filled 2019-05-28: qty 5

## 2019-05-28 MED ORDER — SODIUM CHLORIDE (PF) 0.9 % IJ SOLN
INTRAMUSCULAR | Status: AC
  Filled 2019-05-28: qty 50

## 2019-05-28 MED ORDER — IOHEXOL 300 MG/ML  SOLN
100.0000 mL | Freq: Once | INTRAMUSCULAR | Status: AC | PRN
  Administered 2019-05-28: 100 mL via INTRAVENOUS

## 2019-05-28 MED ORDER — SODIUM CHLORIDE 0.9% FLUSH
10.0000 mL | Freq: Once | INTRAVENOUS | Status: AC
  Administered 2019-05-28: 10 mL
  Filled 2019-05-28: qty 10

## 2019-05-28 MED ORDER — HEPARIN SOD (PORK) LOCK FLUSH 100 UNIT/ML IV SOLN
500.0000 [IU] | Freq: Once | INTRAVENOUS | Status: AC
  Administered 2019-05-28: 500 [IU] via INTRAVENOUS

## 2019-05-28 NOTE — Progress Notes (Signed)
Patient left accessed for CT 

## 2019-05-29 ENCOUNTER — Telehealth: Payer: Self-pay

## 2019-05-29 ENCOUNTER — Inpatient Hospital Stay (HOSPITAL_BASED_OUTPATIENT_CLINIC_OR_DEPARTMENT_OTHER): Payer: BC Managed Care – PPO | Admitting: Hematology and Oncology

## 2019-05-29 ENCOUNTER — Other Ambulatory Visit: Payer: Self-pay

## 2019-05-29 ENCOUNTER — Encounter: Payer: Self-pay | Admitting: Hematology and Oncology

## 2019-05-29 DIAGNOSIS — C252 Malignant neoplasm of tail of pancreas: Secondary | ICD-10-CM | POA: Diagnosis not present

## 2019-05-29 DIAGNOSIS — C7961 Secondary malignant neoplasm of right ovary: Secondary | ICD-10-CM | POA: Diagnosis not present

## 2019-05-29 DIAGNOSIS — M0609 Rheumatoid arthritis without rheumatoid factor, multiple sites: Secondary | ICD-10-CM | POA: Diagnosis not present

## 2019-05-29 DIAGNOSIS — C541 Malignant neoplasm of endometrium: Secondary | ICD-10-CM | POA: Diagnosis not present

## 2019-05-29 DIAGNOSIS — M069 Rheumatoid arthritis, unspecified: Secondary | ICD-10-CM | POA: Diagnosis not present

## 2019-05-29 DIAGNOSIS — Z5111 Encounter for antineoplastic chemotherapy: Secondary | ICD-10-CM | POA: Diagnosis not present

## 2019-05-29 DIAGNOSIS — R109 Unspecified abdominal pain: Secondary | ICD-10-CM

## 2019-05-29 DIAGNOSIS — Z79899 Other long term (current) drug therapy: Secondary | ICD-10-CM | POA: Diagnosis not present

## 2019-05-29 DIAGNOSIS — G620 Drug-induced polyneuropathy: Secondary | ICD-10-CM

## 2019-05-29 DIAGNOSIS — T451X5A Adverse effect of antineoplastic and immunosuppressive drugs, initial encounter: Secondary | ICD-10-CM

## 2019-05-29 DIAGNOSIS — Z8507 Personal history of malignant neoplasm of pancreas: Secondary | ICD-10-CM

## 2019-05-29 DIAGNOSIS — R1900 Intra-abdominal and pelvic swelling, mass and lump, unspecified site: Secondary | ICD-10-CM

## 2019-05-29 DIAGNOSIS — R634 Abnormal weight loss: Secondary | ICD-10-CM | POA: Insufficient documentation

## 2019-05-29 LAB — CANCER ANTIGEN 19-9: CA 19-9: 18 U/mL (ref 0–35)

## 2019-05-29 MED ORDER — HYDROMORPHONE HCL 4 MG PO TABS: 4.0000 mg | ORAL_TABLET | Freq: Four times a day (QID) | ORAL | 0 refills | Status: DC | PRN

## 2019-05-29 MED ORDER — HYDROXYZINE HCL 10 MG PO TABS: 10.0000 mg | ORAL_TABLET | Freq: Three times a day (TID) | ORAL | 1 refills | Status: DC | PRN

## 2019-05-29 MED ORDER — METHADONE HCL 10 MG PO TABS: 20.0000 mg | ORAL_TABLET | Freq: Two times a day (BID) | ORAL | 0 refills | Status: DC

## 2019-05-29 NOTE — Assessment & Plan Note (Signed)
Her unintended weight loss could be related to recent steroid taper along with postprandial abdominal discomfort For now, we will observe closely

## 2019-05-29 NOTE — H&P (View-Only) (Signed)
[place] OFFICE PROGRESS NOTE  Patient Care Team: [name], MD as PCP - General (Family Medicine)  ASSESSMENT & PLAN:  Pancreatic cancer [facility]) I have reviewed blood work and imaging studies with the patient I am concerned about local recurrence of pancreatic cancer I reviewed her CT imaging with the radiologist and the peritoneal nodularity seen on CT imaging is not enough to get a good biopsy Ultimately, I recommend referral back to her gastroenterologist for EUS and biopsy I plan to see her back within a few days after the biopsy to review results and plan of care The patient is aware, if she is found to have recurrence of disease, her disease is considered incurable and she will need to be on palliative chemotherapy lifelong We briefly discussed the choice of treatment but did not go into the fine details   Abdominal pain Her pain is well controlled with methadone and Dilaudid as needed I refill her prescription today We discussed narcotic refill policy  Peripheral neuropathy due to chemotherapy [facility]) She has moderate if we were to start her back on FOLFIRINOX  Rheumatoid arthritis of multiple sites with negative rheumatoid factor ([place]) Depending on results of her biopsy, if she has disease recurrence, she will need to come off her Biologics for rheumatoid arthritis and will be put back on steroids treatment and pain medicine long-term  Weight loss Her unintended weight loss could be related to recent steroid taper along with postprandial abdominal discomfort For now, we will observe closely   No orders of the defined types were placed in this encounter.   All questions were answered. The patient knows to call the clinic with any problems, questions or concerns. The total time spent in the appointment was 30 minutes encounter with patients including review of chart and various tests results, discussions about plan of care and coordination of care plan    Heath Lark, MD 05/29/2019 11:15 AM  INTERVAL HISTORY: Please see below for problem oriented charting. 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 Mixed attenuation mass emanates from the right adnexa measuring 10.8 x 8.0 cm very suspicious for right ovarian carcinoma. 2. Abnormality of the tail of the pancreas may be due to mild pancreatitis and small pseudocyst formation, but neoplasm cannot be excluded. 3. Small amount of ascites within abdomen and pelvis. 4. Small nonobstructing renal calculi bilaterally.    08/30/2017 Pathology Results   1. Uterus and cervix, with left fallopian tube - ENDOMETRIOID ADENOCARCINOMA, FIGO GRADE I, ARISING IN A BACKGROUND OF DIFFUSE COMPLEX ATYPICAL HYPERPLASIA. - CARCINOMA INVADES FOR OF DEPTH OF 0.2 CM WHERE THICKNESS OF MYOMETRIAL WALL IS 2.1 CM. - ALL RESECTION MARGINS ARE NEGATIVE FOR CARCINOMA. - NEGATIVE FOR LYMPHOVASCULAR OR PERINEURAL INVASION. - CERVICAL STROMA IS NOT INVOLVED. - SEE ONCOLOGY TABLE. - SEE NOTE 2. Ovary and fallopian tube, right - PRIMARY OVARIAN ENDOMETRIOID ADENOCARCINOMA, FIGO GRADE II, 12 CM. - THE OVARIAN SURFACE IS FOCALLY INVOLVED BY CARCINOMA. - NEGATIVE FOR LYMPHOVASCULAR INVASION. - BENIGN UNREMARKABLE FALLOPIAN TUBE, NEGATIVE FOR CARCINOMA. -  SEE ONCOLOGY TABLE. - SEE NOTE 3. Cul-de-sac biopsy - METASTATIC ADENOCARCINOMA, MOST CONSISTENT WITH PRIMARY OVARIAN ENDOMETRIOID ADENOCARCINOMA. 4. Ovary, left - BENIGN  UNREMARKABLE OVARY, NEGATIVE FOR MALIGNANCY. Microscopic Comment 1. UTERUS, CARCINOMA OR CARCINOSARCOMA Procedure: Total hysterectomy with bilateral salpingo-oophorectomy. Histologic type: Endometrioid adenocarcinoma. Histologic Grade: FIGO Grade I Myometrial invasion: Depth of invasion: 2 mm Myometrial thickness: 21 mm Uterine Serosa Involvement: Not identified Cervical stromal involvement: Not identified Extent of involvement of other organs: Not applicable Lymphovascular invasion: Not identified Regional Lymph Nodes: Examined: 0 Sentinel 0 Non-sentinel 0 Total Tumor block for ancillary studies: 1H MMR / MSI testing: Pending Pathologic Stage Classification (pTNM, AJCC 8th edition): pT1a, pNX (v4.1.0.0) 2. OVARY or FALLOPIAN TUBE or PRIMARY PERITONEUM: Procedure: Salpingo-oophorectomy Specimen Integrity: Intact Tumor Site: Right ovary Ovarian Surface Involvement (required only if applicable): Focally involved by carcinoma Fallopian Tube Surface Involvement (required only if applicable): Not identified Tumor Size: 12 cm Histologic Type: Endometrioid adenocarcinoma Histologic Grade: Grade II Implants (required for advanced stage serous/seromucinous borderline tumors only): Not applicable Other Tissue/ Organ Involvement: Cul de sac biopsy involved by tumor Largest Extrapelvic Peritoneal Focus (required only if applicable): Not applicable Peritoneal/Ascitic Fluid: Negative for carcinoma (case # [id number]) Treatment Effect (required only for high-grade serous carcinomas): Not applicable Regional Lymph Nodes: No lymph nodes submitted or found Number of Lymph Nodes Examined: 0 Pathologic Stage Classification (pTNM, AJCC 8th Edition): pT2b, pN0 Representative Tumor Block: 2B and 2E 1. Molecular study for microsatellite instability and immunohistochemical stains for MMR-related proteins are pending and will be reported in an addendum. 2. Immunohistochemical stain show that the  ovarian tumor is positive for CK7 and PAX8 (both diffuse), CDX2 (patchy and weak); and negative for CK20. This immunoprofile is consistent with the above diagnosis. Dr. Lyndon Code has reviewed this case and concurs with the above diagnosis. Molecular study for microsatellite instability and immunohistochemical stains for MMR-related proteins are pending and will be reported in an addendum   08/30/2017 Genetic Testing   Patient has genetic testing done for MSI  Results revealed patient has the following mutation(s): loss of [facility] 2   08/30/2017 Surgery   Surgeon: Mart Piggs, MD Pre-operative Diagnosis:  1. Adnexal mass 2. Abnormal uterine bleeding 3. H/o Cecal CA  Post-operative Diagnosis:  1. Adhesive disease post colon resection 2. Endometrial cancer NOS 3. Adenocarcinoma unknown origin, right ovary, suspicious for GI primary  Operation:  1. Lysis of adhesions ~30 minutes 2. Robotic-assisted laparoscopic total hysterectomy with right salpingo-oophorectomy and left salpingectomy 3. Left oophorectomy (RA-laparoscopic) 4. Pelvic washings  Findings: Adhesions of omentum to anterior abdominal wall. Enlarged cystic right ovary ~10cm. Uterus had small nodules on serosa near where right adnexa was intimate with the surface. No obvious intraoperative rupture of cyst, although in 2 areas the wall was thin and one of these areas had some bleeding. Slight scarring of left bladder dome to LUS/cervix. Uterus on frozen section c/w hyperplasia and a small focus of endometrial CA - no myo invasion, <2cm in size. Frozen section on the right adnexa was carcinoma, met from colon or possibly Gyn, favor GI primary, defer to permanent. Left ovary was WNL.     09/15/2017 Cancer Staging   Staging form: Corpus Uteri - Carcinoma and Carcinosarcoma, AJCC 8th Edition - Pathologic: FIGO Stage IA (pT1a, pN0, cM0) - Signed by Heath Lark, MD on 09/15/2017   09/26/2017 Procedure   Successful placement of a right  internal jugular approach power injectable Port-A-Cath. The catheter is ready for immediate use.  11/07/2017 Imaging   1. Since 08/18/2017, similar to slight decrease in size of a pancreatic body/tail junction lesion. Cross modality comparison relative to 09/16/2017 MRI is also grossly similar. 2. No evidence of metastatic disease. 3. Aortic Atherosclerosis (ICD10-I70.0).  4. Left nephrolithiasis.   11/18/2017 Genetic Testing   MSH2 c.2237dupT pathogenic mutation identified in the CancerNext panel.  The CancerNext gene panel offered by [employer] includes sequencing and rearrangement analysis for the following 34 genes:   APC, ATM, BARD1, BMPR1A, BRCA1, BRCA2, BRIP1, CDH1, CDK4, CDKN2A, CHEK2, DICER1, HOXB13, EPCAM, GREM1, MLH1, MRE11A, MSH2, MSH6, MUTYH, NBN, NF1, PALB2, PMS2, POLD1, POLE, PTEN, RAD50, RAD51C, RAD51D, SMAD4, SMARCA4, STK11, and TP53.  The report date is November 18, 2017.  MSH2 [id number] pathogenic mutation identified on somatic testing.  These results are consistent with a diagnosis of Lynch syndrome.   Secondary malignant neoplasm of right ovary ([place])  08/23/2017 Tumor Marker   Patient's tumor was tested for the following markers: CA-125 Results of the tumor marker test revealed 139.8   08/31/2017 Initial Diagnosis   Secondary malignant neoplasm of right ovary ([place])   09/15/2017 Cancer Staging   Staging form: Ovary, Fallopian Tube, and Primary Peritoneal Carcinoma, AJCC 8th Edition - Pathologic: Stage IIB (pT2b, pN0, cM0) - Signed by Heath Lark, MD on 09/15/2017   09/27/2017 Imaging   No evidence of metastatic disease or other acute findings within the thorax.  4 cm low-attenuation mass in pancreatic tail, highly suspicious for pancreatic carcinoma. This is caused splenic vein thrombosis, with new venous collaterals in the left upper quadrant. Consider endoscopic ultrasound with FNA for tissue diagnosis.  Stable benign hepatic hemangioma.     09/27/2017 Tumor Marker   Patient's tumor was tested for the following markers: CA-125 Results of the tumor marker test revealed 39.4   11/02/2017 Tumor Marker   Patient's tumor was tested for the following markers: CA-125 Results of the tumor marker test revealed 19   11/07/2017 Tumor Marker   Patient's tumor was tested for the following markers: CA-125 Results of the tumor marker test revealed 18.3   11/18/2017 Genetic Testing   MSH2 c.2237dupT pathogenic mutation identified in the CancerNext panel.  The CancerNext gene panel offered by [employer] includes sequencing and rearrangement analysis for the following 34 genes:   APC, ATM, BARD1, BMPR1A, BRCA1, BRCA2, BRIP1, CDH1, CDK4, CDKN2A, CHEK2, DICER1, HOXB13, EPCAM, GREM1, MLH1, MRE11A, MSH2, MSH6, MUTYH, NBN, NF1, PALB2, PMS2, POLD1, POLE, PTEN, RAD50, RAD51C, RAD51D, SMAD4, SMARCA4, STK11, and TP53.  The report date is November 18, 2017.  MSH2 [id number] pathogenic mutation identified on somatic testing.  These results are consistent with a diagnosis of Lynch syndrome.   12/15/2017 Tumor Marker   Patient's tumor was tested for the following markers: CA-125 Results of the tumor marker test revealed 57.3   01/16/2018 Tumor Marker   Patient's tumor was tested for the following markers: CA-125 Results of the tumor marker test revealed 24.2   04/10/2018 Tumor Marker   Patient's tumor was tested for the following markers: CA-125 Results of the tumor marker test revealed 18.2   07/12/2018 Tumor Marker   Patient's tumor was tested for the following markers: CA-125 Results of the tumor marker test revealed 11.8   10/16/2018 Tumor Marker   Patient's tumor was tested for the following markers: CA125 Results of the tumor marker test revealed 12.4.   Pancreatic cancer ([place])  10/13/2017 Pathology Results   Pancreas tail mass, endoscopic ultrasound-guided, fine needle aspiration II (smears and cell  block): Adenocarcinoma   11/18/2017 Genetic Testing   MSH2 c.2237dupT pathogenic mutation identified in the CancerNext panel.  The CancerNext gene panel offered by [employer] includes sequencing and rearrangement analysis for the following 34 genes:   APC, ATM, BARD1, BMPR1A, BRCA1, BRCA2, BRIP1, CDH1, CDK4, CDKN2A, CHEK2, DICER1, HOXB13, EPCAM, GREM1, MLH1, MRE11A, MSH2, MSH6, MUTYH, NBN, NF1, PALB2, PMS2, POLD1, POLE, PTEN, RAD50, RAD51C, RAD51D, SMAD4, SMARCA4, STK11, and TP53.  The report date is November 18, 2017.  MSH2 [id number] pathogenic mutation identified on somatic testing.  These results are consistent with a diagnosis of Lynch syndrome.   11/24/2017 Pathology Results   A. "TAIL OF PANCREAS AND SPLEEN", DISTAL PANCREATECTOMY AND SPLENECTOMY: Invasive ductal adenocarcinoma, moderately to poorly differentiated with focal signet ring cell features, of pancreas (distal).   The carcinoma is 2.5 cm in greatest dimension grossly.   Treatment effects present in the form of fibrosis (50%). No lymphovascular or definite perineural invasion identified. All surgical margins are negative for tumor or high-grade dysplasia. Adjacent mucinous neoplasm, most consistent with Intraductal papillary mucinous neoplasm (IPMN) with low-grade dysplasia.   Uninvolved pancreas show atrophy and focal acute inflammation.   Twelve benign lymph nodes (0/12). Spleen with no significant histopathologic abnormalities.  PROCEDURE: distal pancreatectomy and splenectomy TUMOR SITE: distal pancreas TUMOR SIZE:  GREATEST DIMENSION: 2.5 cm  ADDITIONAL DIMENSIONS:  x  cm HISTOLOGIC TYPE: ductal adenocarcinoma HISTOLOGIC GRADE: grade 3 TUMOR EXTENSION: peripancreatic soft tissue MARGINS: negative for tumor TREATMENT EFFECT: treatment effects present in the form of fibrosis (50%). LYMPHOVASCULAR INVASION: not identified PERINEURAL  INVASION: no definite evidence REGIONAL LYMPH NODES:   NUMBER OF LYMPH NODES INVOLVED: 0   NUMBER OF LYMPH NODES EXAMINED: 12 PATHOLOGIC STAGE CLASSIFICATION (pTNM, AJCC 8th Ed): ypT2, ypN0 DISTANT METASTASIS (pM): pMx ADDITIONAL PATHOLOGIC FINDINGS: mucinous neoplasm, most consistent with intraductal papillary mucinous neoplasm (IPMN) with low-grade dysplasia, is identified adjacent to the main tumor.   11/24/2017 Cancer Staging   Staging form: Exocrine Pancreas, AJCC 8th Edition - Pathologic stage from 11/24/2017: Stage IB (pT2, pN0, cM0) - Signed by Truitt Merle, MD on 01/03/2018   11/25/2017 Surgery   She had surgery at [facility] 1. Exploratory Laparotomy 2. Distal Pancreatectomy and Splenectomy 3. Intraoperative Ultrasound 4. Open Cholecystectomy    12/15/2017 Cancer Staging   Staging form: Exocrine Pancreas, AJCC 8th Edition - Clinical: Stage IB (cT2, cN0, cM0) - Signed by Heath Lark, MD on 12/15/2017   12/28/2017 Imaging   12/28/2017 CT Abdomen  IMPRESSION: 1. Postoperative findings from recent partial pancreatectomy including a 21 cubic cm fluid collection along the pancreatic resection margin which could represent early pseudocyst. 2. Nodularity along the lateral limb of the left adrenal gland could also be postoperative but merit surveillance, as the pancreatic lesion was in close proximity to this adrenal gland on the prior CT of 11/07/2017. 3. Asymmetric fullness inferiorly in the left breast. The patient has a history of prior left breast procedures, correlation with mammography is recommended. 4. Other imaging findings of potential clinical significance: Stable hemangioma in the left hepatic lobe. Splenectomy. Aortic Atherosclerosis (ICD10-I70.0). Right hemicolectomy. Small focus of fat necrosis in the right anterior abdominal wall subcutaneous tissues near the laparotomy site. Bilateral nonobstructive nephrolithiasis.   01/02/2018 Tumor Marker   Patient's tumor was  tested for the following markers: CA-19-9 Results of the tumor marker test revealed 5   01/03/2018 - 06/05/2018 Chemotherapy   She received modified dose FOLFIRINOX   04/10/2018 Imaging   1. Distal pancreatectomy, without findings  of recurrent or metastatic disease. 2. Incompletely imaged hypoenhancement within lower lobe right pulmonary artery branch is likely chronic (but interval since 10/09/2017) pulmonary embolism. Dedicated CTA could further evaluate. 3. Decrease in size of a peripancreatic complex fluid collection anteriorly, likely a resolving pseudocyst. 4. Decreased size of minimal fluid versus a borderline sized node in the gastrohepatic ligament. Recommend attention on follow-up. 5. Hepatic steatosis with a segment 4 hemangioma. 6. Aortic Atherosclerosis (ICD10-I70.0). This is significantly age advanced.   07/12/2018 Tumor Marker   Patient's tumor was tested for the following markers: CA-19-9 Results of the tumor marker test revealed 6   07/12/2018 Imaging   1. Status post distal pancreatectomy with stable postoperative fluid collection adjacent to the ventral aspect of the pancreatic head. No findings to suggest metastatic disease in the abdomen or pelvis. 2. Hepatic steatosis with small cavernous hemangioma in segment 4A of the liver. 3. Slight decreased size of nonenlarged gastrohepatic ligament lymph node, presumably benign. 4. Aortic atherosclerosis.    10/16/2018 Imaging   Status post distal pancreatectomy. Stable postoperative seroma along the surgical margin.   Status post hysterectomy and right oophorectomy.   Status post right hemicolectomy with appendectomy.   No evidence of recurrent or metastatic disease.   No colonic wall thickening or mass is evident on CT.   01/22/2019 Tumor Marker   Patient's tumor was tested for the following markers: CA-19.9 Results of the tumor marker test revealed 5   01/22/2019 Imaging   1. No evidence of local recurrence or  metastatic disease status post distal pancreatectomy and splenectomy. 2. Stable small seroma anterior to the pancreatic head. 3. Postsurgical changes as described. 4. Stable additional incidental findings including a hepatic hemangioma, nonobstructing bilateral renal calculi and aortic Atherosclerosis (ICD10-I70.0).   04/18/2019 Imaging   1. Status post distal pancreatectomy and splenectomy. There has been a gradual increase in ill-defined soft tissue and celiac axis/SMA origin lymph nodes adjacent to the distal pancreatectomy margin and lesser curvature of the stomach/gastric body over sequential prior examinations dated 01/22/2019 and 09/26/2018. An enlarged lymph node or soft tissue nodule adjacent to the SMA origin now measures 1.8 x 0.8 cm. Findings are concerning for local recurrence of pancreatic malignancy. 2. Separately from the above findings, there remains a 1.0 cm low-attenuation nodule anterior to the remnant pancreatic neck, most consistent with postoperative seroma 3. No evidence of distant metastatic disease in the chest, abdomen, or pelvis. 4. Postoperative findings of cholecystectomy, right hemicolectomy, and hysterectomy. 5. Bilateral nonobstructive nephrolithiasis. 6.  Aortic Atherosclerosis (ICD10-I70.0).       05/28/2019 Tumor Marker   Patient's tumor was tested for the following markers: CA19-9 Results of the tumor marker test revealed 18   05/28/2019 Imaging   1. Status post distal pancreatectomy with continued further progression of the ill-defined soft tissue to the left of the celiac axis/SMA origin, now measuring 1.9 x 1.9 cm and highly concerning for recurrent/metastatic disease. This process abuts both the celiac axis, SMA, and posterior wall of the mid stomach. PET-CT may prove helpful to further evaluate. 2. Bandlike ill-defined soft tissue in the anterior abdomen, potentially peritoneal or omental is similar to perhaps minimally increased qualitatively in the  interval. Close attention on follow-up recommended. 3. Left paraumbilical hernia contains a short segment of small bowel without complicating features. 4. Ill-defined very subtle area of decreased attenuation in the posterior aspect of hepatic segment IV. This is likely focal fatty deposition. Close attention on follow-up recommended. 5.  Aortic Atherosclerois (ICD10-170.0)  REVIEW OF SYSTEMS:   Constitutional: Denies fevers, chills  Eyes: Denies blurriness of vision Ears, nose, mouth, throat, and face: Denies mucositis or sore throat Respiratory: Denies cough, dyspnea or wheezes Cardiovascular: Denies palpitation, chest discomfort or lower extremity swelling Gastrointestinal:  Denies nausea, heartburn or change in bowel habits Skin: Denies abnormal skin rashes Lymphatics: Denies new lymphadenopathy or easy bruising Neurological:Denies numbness, tingling or new weaknesses Behavioral/Psych: Mood is stable, no new changes  All other systems were reviewed with the patient and are negative.  I have reviewed the past medical history, past surgical history, social history and family history with the patient and they are unchanged from previous note.  ALLERGIES:  is allergic to codeine; penicillins; and bactrim [sulfamethoxazole-trimethoprim].  MEDICATIONS:  Current Outpatient Medications  Medication Sig Dispense Refill  . Cetirizine HCl (ZYRTEC PO) Take by mouth.    . citalopram (CELEXA) 20 MG tablet Take 1 tablet (20 mg total) by mouth daily. 30 tablet 11  . CREON 24000-76000 units CPEP TAKE BY MOUTH 1 CAP BEFORE EACH MEAL AND 1 CAP AFTER EACH MEAL AND 1 CAP WITH SNACKS.    . diclofenac Sodium (VOLTAREN) 1 % GEL Apply 2-4 grams to affected joint 4 times daily as needed. 400 g 1  . estradiol (ESTRACE VAGINAL) 0.1 MG/GM vaginal cream Place 1 Applicatorful vaginally 3 (three) times a week. 42.5 g 12  . fluticasone (FLONASE) 50 MCG/ACT nasal spray Place 1 spray into both nostrils daily.     . folic acid (FOLVITE) 1 MG tablet Take 2 tablets (2 mg total) by mouth daily. 180 tablet 3  . gabapentin (NEURONTIN) 300 MG capsule Take 1 capsule (300 mg total) by mouth 3 (three) times daily. 90 capsule 3  . HYDROmorphone (DILAUDID) 4 MG tablet Take 1 tablet (4 mg total) by mouth every 6 (six) hours as needed for severe pain. 60 tablet 0  . hydrOXYzine (ATARAX/VISTARIL) 10 MG tablet Take 1 tablet (10 mg total) by mouth 3 (three) times daily as needed. 30 tablet 1  . lidocaine-prilocaine (EMLA) cream Apply 1 application topically daily as needed (port). Apply to affected area once 30 g 11  . metFORMIN (GLUCOPHAGE) 500 MG tablet TAKE 1 TABLET (500 MG TOTAL) BY MOUTH 2 (TWO) TIMES DAILY WITH A MEAL. (Patient taking differently: Take 500 mg by mouth 2 (two) times daily with a meal. ) 60 tablet 1  . methadone (DOLOPHINE) 10 MG tablet Take 2 tablets (20 mg total) by mouth every 12 (twelve) hours. 60 tablet 0  . methocarbamol (ROBAXIN) 500 MG tablet TAKE 1 TABLET BY MOUTH 3 TIMES DAILY AS NEEDED FOR MUSCLE SPASMS. 90 tablet 0  . Multiple Vitamin (MULTIVITAMIN WITH MINERALS) TABS tablet Take 1 tablet by mouth daily.    Marland Kitchen omeprazole (PRILOSEC) 40 MG capsule Take 1 capsule (40 mg total) by mouth daily. 30 capsule 11  . ORENCIA CLICKJECT 376 MG/ML SOAJ INJECT ONE CLICKJECT PEN SUBCUTANEOUSLY EVERY WEEK. REFRIGERATE. ALLOW TO WARM TO ROOM TEMPERATURE PRIOR TO ADMINISTRATION. 12 mL 0  . OTREXUP 20 MG/0.4ML SOAJ INJECT ONE PEN SUBCUTANEOUSLY ONCE EVERY WEEK. STORE AT ROOM TEMPERATURE BETWEEN 68 - 77 DEGREES F. 12 pen 0  . predniSONE (DELTASONE) 1 MG tablet Take 4 tablets (4 mg total) by mouth daily with breakfast. 240 tablet 0  . simethicone (MYLICON) 283 MG chewable tablet Chew 125 mg by mouth every 6 (six) hours as needed for flatulence.    . traMADol (ULTRAM) 50 MG tablet Take 1 tablet (50 mg total)  by mouth every 6 (six) hours as needed for moderate pain. 60 tablet 0   No current facility-administered  medications for this visit.    PHYSICAL EXAMINATION: ECOG PERFORMANCE STATUS: 1 - Symptomatic but completely ambulatory  Vitals:   05/29/19 0929  BP: 128/81  Pulse: 74  Resp: 18  Temp: 98.3 F (36.8 C)  SpO2: 99%   Filed Weights   05/29/19 0929  Weight: 182 lb 9.6 oz (82.8 kg)    GENERAL:alert, no distress and comfortable NEURO: alert & oriented x 3 with fluent speech, no focal motor/sensory deficits  LABORATORY DATA:  I have reviewed the data as listed    Component Value Date/Time   NA 140 05/28/2019 1212   K 4.2 05/28/2019 1212   CL 100 05/28/2019 1212   CO2 26 05/28/2019 1212   GLUCOSE 104 (H) 05/28/2019 1212   BUN 10 05/28/2019 1212   CREATININE 0.64 05/28/2019 1212   CREATININE 0.71 05/23/2018 0905   CREATININE 0.68 07/27/2017 0851   CALCIUM 9.3 05/28/2019 1212   PROT 7.2 05/28/2019 1212   ALBUMIN 3.2 (L) 05/28/2019 1212   AST 19 05/28/2019 1212   AST 24 05/23/2018 0905   ALT 21 05/28/2019 1212   ALT 49 (H) 05/23/2018 0905   ALKPHOS 88 05/28/2019 1212   BILITOT <0.2 (L) 05/28/2019 1212   BILITOT 0.2 (L) 05/23/2018 0905   GFRNONAA >60 05/28/2019 1212   GFRNONAA >60 05/23/2018 0905   GFRNONAA 106 07/27/2017 0851   GFRAA >60 05/28/2019 1212   GFRAA >60 05/23/2018 0905   GFRAA 123 07/27/2017 0851    No results found for: SPEP, UPEP  Lab Results  Component Value Date   WBC 11.6 (H) 05/28/2019   NEUTROABS 7.4 05/28/2019   HGB 11.5 (L) 05/28/2019   HCT 36.6 05/28/2019   MCV 90.4 05/28/2019   PLT 658 (H) 05/28/2019      Chemistry      Component Value Date/Time   NA 140 05/28/2019 1212   K 4.2 05/28/2019 1212   CL 100 05/28/2019 1212   CO2 26 05/28/2019 1212   BUN 10 05/28/2019 1212   CREATININE 0.64 05/28/2019 1212   CREATININE 0.71 05/23/2018 0905   CREATININE 0.68 07/27/2017 0851      Component Value Date/Time   CALCIUM 9.3 05/28/2019 1212   ALKPHOS 88 05/28/2019 1212   AST 19 05/28/2019 1212   AST 24 05/23/2018 0905   ALT 21  05/28/2019 1212   ALT 49 (H) 05/23/2018 0905   BILITOT <0.2 (L) 05/28/2019 1212   BILITOT 0.2 (L) 05/23/2018 0905       RADIOGRAPHIC STUDIES: I have reviewed imaging studies with the patient I have reviewed I have personally reviewed the radiological images as listed and agreed with the findings in the report. DG Chest 2 View  Result Date: 05/02/2019 CLINICAL DATA:  Low-grade fever, chills EXAM: CHEST - 2 VIEW COMPARISON:  02/14/2018 FINDINGS: Right Port-A-Cath remains in place, unchanged. Heart and mediastinal contours are within normal limits. No focal opacities or effusions. No acute bony abnormality. IMPRESSION: No active cardiopulmonary disease. Electronically Signed   By: Rolm Baptise M.D.   On: 05/02/2019 11:48   CT ABDOMEN PELVIS W CONTRAST  Result Date: 05/28/2019 CLINICAL DATA:  Pancreatic cancer, surveillance pancreatic ca, abnormal CT on 2/23 with abdominal pain, reassess, cancer versus pancreatitis. Also with a history of uterine cancer. EXAM: CT ABDOMEN AND PELVIS WITH CONTRAST TECHNIQUE: Multidetector CT imaging of the abdomen and pelvis was performed using the  standard protocol following bolus administration of intravenous contrast. CONTRAST:  17m OMNIPAQUE IOHEXOL 300 MG/ML  SOLN COMPARISON:  04/17/2019 FINDINGS: Lower chest: Unremarkable Hepatobiliary: 2.9 cm heterogeneously enhancing lesion in the anterior hepatic dome is stable in the interval since prior study and was characterized as cavernous hemangioma on MRI of 09/16/2017. Subtle decreased attenuation posterior segment IV (34/2) likely related to fatty deposition. Gallbladder is surgically absent. No intrahepatic or extrahepatic biliary dilation. Pancreas: Status post distal pancreatectomy. The small low-density lesion anterior to the pancreatic neck is stable in size in the interval but has some peripheral calcification on the current study (image 50/2). Ill-defined soft tissue to the left of the celiac axis is similar to  prior measuring adjacent to the SMA as on prior study, this abnormal soft tissue measures 1.9 x 1.9 cm today compared to 1.8 x 0.8 cm previously. This soft tissue abuts the posterior wall of the mid stomach. Spleen: Surgically absent. Adrenals/Urinary Tract: Right adrenal gland unremarkable. Left adrenal gland in close proximity and potentially involved by the abnormal retroperitoneal soft tissue described above in the pancreatectomy bed. Stomach/Bowel: Stomach is unremarkable. No gastric wall thickening. No evidence of outlet obstruction. Duodenum is normally positioned as is the ligament of Treitz. No small bowel wall thickening. No small bowel dilatation. Status post right hemicolectomy. No gross colonic mass. No colonic wall thickening. Vascular/Lymphatic: There is abdominal aortic atherosclerosis without aneurysm. No pelvic sidewall lymphadenopathy. Reproductive: The uterus is surgically absent. There is no adnexal mass. Other: Areas of ill-defined nodularity identified in the expected location of the omentum although the patient may be status post omentectomy. Representative examples visible on image 46/7 and in the left anterior abdomen on 58/7. No intraperitoneal free fluid. Musculoskeletal: No worrisome lytic or sclerotic osseous abnormality. Left paraumbilical hernia contains a loop of small bowel without complicating features. IMPRESSION: 1. Status post distal pancreatectomy with continued further progression of the ill-defined soft tissue to the left of the celiac axis/SMA origin, now measuring 1.9 x 1.9 cm and highly concerning for recurrent/metastatic disease. This process abuts both the celiac axis, SMA, and posterior wall of the mid stomach. PET-CT may prove helpful to further evaluate. 2. Bandlike ill-defined soft tissue in the anterior abdomen, potentially peritoneal or omental is similar to perhaps minimally increased qualitatively in the interval. Close attention on follow-up recommended. 3. Left  paraumbilical hernia contains a short segment of small bowel without complicating features. 4. Ill-defined very subtle area of decreased attenuation in the posterior aspect of hepatic segment IV. This is likely focal fatty deposition. Close attention on follow-up recommended. 5.  Aortic Atherosclerois (ICD10-170.0) Electronically Signed   By: EMisty StanleyM.D.   On: 05/28/2019 14:46

## 2019-05-29 NOTE — Telephone Encounter (Signed)
EUS scheduled for 06/07/19 at 1115 am at [facility] with Dr Ardis Hughs.  COVID test on 4/12 at 1040 am.  Left message on machine to call back

## 2019-05-29 NOTE — Assessment & Plan Note (Signed)
She has moderate if we were to start her back on FOLFIRINOX

## 2019-05-29 NOTE — Assessment & Plan Note (Signed)
Depending on results of her biopsy, if she has disease recurrence, she will need to come off her Biologics for rheumatoid arthritis and will be put back on steroids treatment and pain medicine long-term

## 2019-05-29 NOTE — Assessment & Plan Note (Signed)
Her pain is well controlled with methadone and Dilaudid as needed I refill her prescription today We discussed narcotic refill policy

## 2019-05-29 NOTE — Telephone Encounter (Signed)
-----   Message from [name], MD sent at 05/29/2019 10:39 AM EDT ----- Regarding: RE: EUS/biopsy [name], Yes I think EUS approach is best for this.  We will contact her to arrange, looking like next Thursday (15th). Can you let her know we'll be calling. Thanks     Dana Hicks, She needs upper EUS next Thursday (15th)for h/op pancreatic cancer, now with enlarging abdominal mass. Thanks, I believe I still have one spot open still at the end of the day.    ----- Message ----- From: Heath Lark, MD Sent: 05/29/2019   9:45 AM EDT To: [name], MD Subject: EUS/biopsy                                     Hi,  I just saw her today I reviewed her scans with IR this morning and he told me the best area for biopsy is through EUS/biopsy of the lesion near her pancreas Can you help set that up ASAP?  Thanks, [name]

## 2019-05-29 NOTE — Progress Notes (Signed)
[place] OFFICE PROGRESS NOTE  Patient Care Team: [name], MD as PCP - General (Family Medicine)  ASSESSMENT & PLAN:  Pancreatic cancer [facility]) I have reviewed blood work and imaging studies with the patient I am concerned about local recurrence of pancreatic cancer I reviewed her CT imaging with the radiologist and the peritoneal nodularity seen on CT imaging is not enough to get a good biopsy Ultimately, I recommend referral back to her gastroenterologist for EUS and biopsy I plan to see her back within a few days after the biopsy to review results and plan of care The patient is aware, if she is found to have recurrence of disease, her disease is considered incurable and she will need to be on palliative chemotherapy lifelong We briefly discussed the choice of treatment but did not go into the fine details   Abdominal pain Her pain is well controlled with methadone and Dilaudid as needed I refill her prescription today We discussed narcotic refill policy  Peripheral neuropathy due to chemotherapy [facility]) She has moderate if we were to start her back on FOLFIRINOX  Rheumatoid arthritis of multiple sites with negative rheumatoid factor ([place]) Depending on results of her biopsy, if she has disease recurrence, she will need to come off her Biologics for rheumatoid arthritis and will be put back on steroids treatment and pain medicine long-term  Weight loss Her unintended weight loss could be related to recent steroid taper along with postprandial abdominal discomfort For now, we will observe closely   No orders of the defined types were placed in this encounter.   All questions were answered. The patient knows to call the clinic with any problems, questions or concerns. The total time spent in the appointment was 30 minutes encounter with patients including review of chart and various tests results, discussions about plan of care and coordination of care plan    Heath Lark, MD 05/29/2019 11:15 AM  INTERVAL HISTORY: Please see below for problem oriented charting. 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 Mixed attenuation mass emanates from the right adnexa measuring 10.8 x 8.0 cm very suspicious for right ovarian carcinoma. 2. Abnormality of the tail of the pancreas may be due to mild pancreatitis and small pseudocyst formation, but neoplasm cannot be excluded. 3. Small amount of ascites within abdomen and pelvis. 4. Small nonobstructing renal calculi bilaterally.    08/30/2017 Pathology Results   1. Uterus and cervix, with left fallopian tube - ENDOMETRIOID ADENOCARCINOMA, FIGO GRADE I, ARISING IN A BACKGROUND OF DIFFUSE COMPLEX ATYPICAL HYPERPLASIA. - CARCINOMA INVADES FOR OF DEPTH OF 0.2 CM WHERE THICKNESS OF MYOMETRIAL WALL IS 2.1 CM. - ALL RESECTION MARGINS ARE NEGATIVE FOR CARCINOMA. - NEGATIVE FOR LYMPHOVASCULAR OR PERINEURAL INVASION. - CERVICAL STROMA IS NOT INVOLVED. - SEE ONCOLOGY TABLE. - SEE NOTE 2. Ovary and fallopian tube, right - PRIMARY OVARIAN ENDOMETRIOID ADENOCARCINOMA, FIGO GRADE II, 12 CM. - THE OVARIAN SURFACE IS FOCALLY INVOLVED BY CARCINOMA. - NEGATIVE FOR LYMPHOVASCULAR INVASION. - BENIGN UNREMARKABLE FALLOPIAN TUBE, NEGATIVE FOR CARCINOMA. -  SEE ONCOLOGY TABLE. - SEE NOTE 3. Cul-de-sac biopsy - METASTATIC ADENOCARCINOMA, MOST CONSISTENT WITH PRIMARY OVARIAN ENDOMETRIOID ADENOCARCINOMA. 4. Ovary, left - BENIGN  UNREMARKABLE OVARY, NEGATIVE FOR MALIGNANCY. Microscopic Comment 1. UTERUS, CARCINOMA OR CARCINOSARCOMA Procedure: Total hysterectomy with bilateral salpingo-oophorectomy. Histologic type: Endometrioid adenocarcinoma. Histologic Grade: FIGO Grade I Myometrial invasion: Depth of invasion: 2 mm Myometrial thickness: 21 mm Uterine Serosa Involvement: Not identified Cervical stromal involvement: Not identified Extent of involvement of other organs: Not applicable Lymphovascular invasion: Not identified Regional Lymph Nodes: Examined: 0 Sentinel 0 Non-sentinel 0 Total Tumor block for ancillary studies: 1H MMR / MSI testing: Pending Pathologic Stage Classification (pTNM, AJCC 8th edition): pT1a, pNX (v4.1.0.0) 2. OVARY or FALLOPIAN TUBE or PRIMARY PERITONEUM: Procedure: Salpingo-oophorectomy Specimen Integrity: Intact Tumor Site: Right ovary Ovarian Surface Involvement (required only if applicable): Focally involved by carcinoma Fallopian Tube Surface Involvement (required only if applicable): Not identified Tumor Size: 12 cm Histologic Type: Endometrioid adenocarcinoma Histologic Grade: Grade II Implants (required for advanced stage serous/seromucinous borderline tumors only): Not applicable Other Tissue/ Organ Involvement: Cul de sac biopsy involved by tumor Largest Extrapelvic Peritoneal Focus (required only if applicable): Not applicable Peritoneal/Ascitic Fluid: Negative for carcinoma (case # [id number]) Treatment Effect (required only for high-grade serous carcinomas): Not applicable Regional Lymph Nodes: No lymph nodes submitted or found Number of Lymph Nodes Examined: 0 Pathologic Stage Classification (pTNM, AJCC 8th Edition): pT2b, pN0 Representative Tumor Block: 2B and 2E 1. Molecular study for microsatellite instability and immunohistochemical stains for MMR-related proteins are pending and will be reported in an addendum. 2. Immunohistochemical stain show that the  ovarian tumor is positive for CK7 and PAX8 (both diffuse), CDX2 (patchy and weak); and negative for CK20. This immunoprofile is consistent with the above diagnosis. Dr. Lyndon Code has reviewed this case and concurs with the above diagnosis. Molecular study for microsatellite instability and immunohistochemical stains for MMR-related proteins are pending and will be reported in an addendum   08/30/2017 Genetic Testing   Patient has genetic testing done for MSI  Results revealed patient has the following mutation(s): loss of [facility] 2   08/30/2017 Surgery   Surgeon: Mart Piggs, MD Pre-operative Diagnosis:  1. Adnexal mass 2. Abnormal uterine bleeding 3. H/o Cecal CA  Post-operative Diagnosis:  1. Adhesive disease post colon resection 2. Endometrial cancer NOS 3. Adenocarcinoma unknown origin, right ovary, suspicious for GI primary  Operation:  1. Lysis of adhesions ~30 minutes 2. Robotic-assisted laparoscopic total hysterectomy with right salpingo-oophorectomy and left salpingectomy 3. Left oophorectomy (RA-laparoscopic) 4. Pelvic washings  Findings: Adhesions of omentum to anterior abdominal wall. Enlarged cystic right ovary ~10cm. Uterus had small nodules on serosa near where right adnexa was intimate with the surface. No obvious intraoperative rupture of cyst, although in 2 areas the wall was thin and one of these areas had some bleeding. Slight scarring of left bladder dome to LUS/cervix. Uterus on frozen section c/w hyperplasia and a small focus of endometrial CA - no myo invasion, <2cm in size. Frozen section on the right adnexa was carcinoma, met from colon or possibly Gyn, favor GI primary, defer to permanent. Left ovary was WNL.     09/15/2017 Cancer Staging   Staging form: Corpus Uteri - Carcinoma and Carcinosarcoma, AJCC 8th Edition - Pathologic: FIGO Stage IA (pT1a, pN0, cM0) - Signed by Heath Lark, MD on 09/15/2017   09/26/2017 Procedure   Successful placement of a right  internal jugular approach power injectable Port-A-Cath. The catheter is ready for immediate use.  11/07/2017 Imaging   1. Since 08/18/2017, similar to slight decrease in size of a pancreatic body/tail junction lesion. Cross modality comparison relative to 09/16/2017 MRI is also grossly similar. 2. No evidence of metastatic disease. 3. Aortic Atherosclerosis (ICD10-I70.0).  4. Left nephrolithiasis.   11/18/2017 Genetic Testing   MSH2 c.[id number] pathogenic mutation identified in the CancerNext panel.  The CancerNext gene panel offered by [employer] includes sequencing and rearrangement analysis for the following 34 genes:   APC, ATM, BARD1, BMPR1A, BRCA1, BRCA2, BRIP1, CDH1, CDK4, CDKN2A, CHEK2, DICER1, HOXB13, EPCAM, GREM1, MLH1, MRE11A, MSH2, MSH6, MUTYH, NBN, NF1, PALB2, PMS2, POLD1, POLE, PTEN, RAD50, RAD51C, RAD51D, SMAD4, SMARCA4, STK11, and TP53.  The report date is November 18, 2017.  MSH2 [id number] pathogenic mutation identified on somatic testing.  These results are consistent with a diagnosis of Lynch syndrome.   Secondary malignant neoplasm of right ovary ([place])  08/23/2017 Tumor Marker   Patient's tumor was tested for the following markers: CA-125 Results of the tumor marker test revealed 139.8   08/31/2017 Initial Diagnosis   Secondary malignant neoplasm of right ovary ([place])   09/15/2017 Cancer Staging   Staging form: Ovary, Fallopian Tube, and Primary Peritoneal Carcinoma, AJCC 8th Edition - Pathologic: Stage IIB (pT2b, pN0, cM0) - Signed by Heath Lark, MD on 09/15/2017   09/27/2017 Imaging   No evidence of metastatic disease or other acute findings within the thorax.  4 cm low-attenuation mass in pancreatic tail, highly suspicious for pancreatic carcinoma. This is caused splenic vein thrombosis, with new venous collaterals in the left upper quadrant. Consider endoscopic ultrasound with FNA for tissue diagnosis.  Stable benign hepatic hemangioma.     09/27/2017 Tumor Marker   Patient's tumor was tested for the following markers: CA-125 Results of the tumor marker test revealed 39.4   11/02/2017 Tumor Marker   Patient's tumor was tested for the following markers: CA-125 Results of the tumor marker test revealed 19   11/07/2017 Tumor Marker   Patient's tumor was tested for the following markers: CA-125 Results of the tumor marker test revealed 18.3   11/18/2017 Genetic Testing   MSH2 c.2237dupT pathogenic mutation identified in the CancerNext panel.  The CancerNext gene panel offered by [employer] includes sequencing and rearrangement analysis for the following 34 genes:   APC, ATM, BARD1, BMPR1A, BRCA1, BRCA2, BRIP1, CDH1, CDK4, CDKN2A, CHEK2, DICER1, HOXB13, EPCAM, GREM1, MLH1, MRE11A, MSH2, MSH6, MUTYH, NBN, NF1, PALB2, PMS2, POLD1, POLE, PTEN, RAD50, RAD51C, RAD51D, SMAD4, SMARCA4, STK11, and TP53.  The report date is November 18, 2017.  MSH2 [id number] pathogenic mutation identified on somatic testing.  These results are consistent with a diagnosis of Lynch syndrome.   12/15/2017 Tumor Marker   Patient's tumor was tested for the following markers: CA-125 Results of the tumor marker test revealed 57.3   01/16/2018 Tumor Marker   Patient's tumor was tested for the following markers: CA-125 Results of the tumor marker test revealed 24.2   04/10/2018 Tumor Marker   Patient's tumor was tested for the following markers: CA-125 Results of the tumor marker test revealed 18.2   07/12/2018 Tumor Marker   Patient's tumor was tested for the following markers: CA-125 Results of the tumor marker test revealed 11.8   10/16/2018 Tumor Marker   Patient's tumor was tested for the following markers: CA125 Results of the tumor marker test revealed 12.4.   Pancreatic cancer ([place])  10/13/2017 Pathology Results   Pancreas tail mass, endoscopic ultrasound-guided, fine needle aspiration II (smears and cell  block): Adenocarcinoma   11/18/2017 Genetic Testing   MSH2 c.2237dupT pathogenic mutation identified in the CancerNext panel.  The CancerNext gene panel offered by [employer] includes sequencing and rearrangement analysis for the following 34 genes:   APC, ATM, BARD1, BMPR1A, BRCA1, BRCA2, BRIP1, CDH1, CDK4, CDKN2A, CHEK2, DICER1, HOXB13, EPCAM, GREM1, MLH1, MRE11A, MSH2, MSH6, MUTYH, NBN, NF1, PALB2, PMS2, POLD1, POLE, PTEN, RAD50, RAD51C, RAD51D, SMAD4, SMARCA4, STK11, and TP53.  The report date is November 18, 2017.  MSH2 [id number] pathogenic mutation identified on somatic testing.  These results are consistent with a diagnosis of Lynch syndrome.   11/24/2017 Pathology Results   A. "TAIL OF PANCREAS AND SPLEEN", DISTAL PANCREATECTOMY AND SPLENECTOMY: Invasive ductal adenocarcinoma, moderately to poorly differentiated with focal signet ring cell features, of pancreas (distal).   The carcinoma is 2.5 cm in greatest dimension grossly.   Treatment effects present in the form of fibrosis (50%). No lymphovascular or definite perineural invasion identified. All surgical margins are negative for tumor or high-grade dysplasia. Adjacent mucinous neoplasm, most consistent with Intraductal papillary mucinous neoplasm (IPMN) with low-grade dysplasia.   Uninvolved pancreas show atrophy and focal acute inflammation.   Twelve benign lymph nodes (0/12). Spleen with no significant histopathologic abnormalities.  PROCEDURE: distal pancreatectomy and splenectomy TUMOR SITE: distal pancreas TUMOR SIZE:  GREATEST DIMENSION: 2.5 cm  ADDITIONAL DIMENSIONS:  x  cm HISTOLOGIC TYPE: ductal adenocarcinoma HISTOLOGIC GRADE: grade 3 TUMOR EXTENSION: peripancreatic soft tissue MARGINS: negative for tumor TREATMENT EFFECT: treatment effects present in the form of fibrosis (50%). LYMPHOVASCULAR INVASION: not identified PERINEURAL  INVASION: no definite evidence REGIONAL LYMPH NODES:   NUMBER OF LYMPH NODES INVOLVED: 0   NUMBER OF LYMPH NODES EXAMINED: 12 PATHOLOGIC STAGE CLASSIFICATION (pTNM, AJCC 8th Ed): ypT2, ypN0 DISTANT METASTASIS (pM): pMx ADDITIONAL PATHOLOGIC FINDINGS: mucinous neoplasm, most consistent with intraductal papillary mucinous neoplasm (IPMN) with low-grade dysplasia, is identified adjacent to the main tumor.   11/24/2017 Cancer Staging   Staging form: Exocrine Pancreas, AJCC 8th Edition - Pathologic stage from 11/24/2017: Stage IB (pT2, pN0, cM0) - Signed by Truitt Merle, MD on 01/03/2018   11/25/2017 Surgery   She had surgery at [facility] 1. Exploratory Laparotomy 2. Distal Pancreatectomy and Splenectomy 3. Intraoperative Ultrasound 4. Open Cholecystectomy    12/15/2017 Cancer Staging   Staging form: Exocrine Pancreas, AJCC 8th Edition - Clinical: Stage IB (cT2, cN0, cM0) - Signed by Heath Lark, MD on 12/15/2017   12/28/2017 Imaging   12/28/2017 CT Abdomen  IMPRESSION: 1. Postoperative findings from recent partial pancreatectomy including a 21 cubic cm fluid collection along the pancreatic resection margin which could represent early pseudocyst. 2. Nodularity along the lateral limb of the left adrenal gland could also be postoperative but merit surveillance, as the pancreatic lesion was in close proximity to this adrenal gland on the prior CT of 11/07/2017. 3. Asymmetric fullness inferiorly in the left breast. The patient has a history of prior left breast procedures, correlation with mammography is recommended. 4. Other imaging findings of potential clinical significance: Stable hemangioma in the left hepatic lobe. Splenectomy. Aortic Atherosclerosis (ICD10-I70.0). Right hemicolectomy. Small focus of fat necrosis in the right anterior abdominal wall subcutaneous tissues near the laparotomy site. Bilateral nonobstructive nephrolithiasis.   01/02/2018 Tumor Marker   Patient's tumor was  tested for the following markers: CA-19-9 Results of the tumor marker test revealed 5   01/03/2018 - 06/05/2018 Chemotherapy   She received modified dose FOLFIRINOX   04/10/2018 Imaging   1. Distal pancreatectomy, without findings  of recurrent or metastatic disease. 2. Incompletely imaged hypoenhancement within lower lobe right pulmonary artery branch is likely chronic (but interval since 10/09/2017) pulmonary embolism. Dedicated CTA could further evaluate. 3. Decrease in size of a peripancreatic complex fluid collection anteriorly, likely a resolving pseudocyst. 4. Decreased size of minimal fluid versus a borderline sized node in the gastrohepatic ligament. Recommend attention on follow-up. 5. Hepatic steatosis with a segment 4 hemangioma. 6. Aortic Atherosclerosis (ICD10-I70.0). This is significantly age advanced.   07/12/2018 Tumor Marker   Patient's tumor was tested for the following markers: CA-19-9 Results of the tumor marker test revealed 6   07/12/2018 Imaging   1. Status post distal pancreatectomy with stable postoperative fluid collection adjacent to the ventral aspect of the pancreatic head. No findings to suggest metastatic disease in the abdomen or pelvis. 2. Hepatic steatosis with small cavernous hemangioma in segment 4A of the liver. 3. Slight decreased size of nonenlarged gastrohepatic ligament lymph node, presumably benign. 4. Aortic atherosclerosis.    10/16/2018 Imaging   Status post distal pancreatectomy. Stable postoperative seroma along the surgical margin.   Status post hysterectomy and right oophorectomy.   Status post right hemicolectomy with appendectomy.   No evidence of recurrent or metastatic disease.   No colonic wall thickening or mass is evident on CT.   01/22/2019 Tumor Marker   Patient's tumor was tested for the following markers: CA-19.9 Results of the tumor marker test revealed 5   01/22/2019 Imaging   1. No evidence of local recurrence or  metastatic disease status post distal pancreatectomy and splenectomy. 2. Stable small seroma anterior to the pancreatic head. 3. Postsurgical changes as described. 4. Stable additional incidental findings including a hepatic hemangioma, nonobstructing bilateral renal calculi and aortic Atherosclerosis (ICD10-I70.0).   04/18/2019 Imaging   1. Status post distal pancreatectomy and splenectomy. There has been a gradual increase in ill-defined soft tissue and celiac axis/SMA origin lymph nodes adjacent to the distal pancreatectomy margin and lesser curvature of the stomach/gastric body over sequential prior examinations dated 01/22/2019 and 09/26/2018. An enlarged lymph node or soft tissue nodule adjacent to the SMA origin now measures 1.8 x 0.8 cm. Findings are concerning for local recurrence of pancreatic malignancy. 2. Separately from the above findings, there remains a 1.0 cm low-attenuation nodule anterior to the remnant pancreatic neck, most consistent with postoperative seroma 3. No evidence of distant metastatic disease in the chest, abdomen, or pelvis. 4. Postoperative findings of cholecystectomy, right hemicolectomy, and hysterectomy. 5. Bilateral nonobstructive nephrolithiasis. 6.  Aortic Atherosclerosis (ICD10-I70.0).       05/28/2019 Tumor Marker   Patient's tumor was tested for the following markers: CA19-9 Results of the tumor marker test revealed 18   05/28/2019 Imaging   1. Status post distal pancreatectomy with continued further progression of the ill-defined soft tissue to the left of the celiac axis/SMA origin, now measuring 1.9 x 1.9 cm and highly concerning for recurrent/metastatic disease. This process abuts both the celiac axis, SMA, and posterior wall of the mid stomach. PET-CT may prove helpful to further evaluate. 2. Bandlike ill-defined soft tissue in the anterior abdomen, potentially peritoneal or omental is similar to perhaps minimally increased qualitatively in the  interval. Close attention on follow-up recommended. 3. Left paraumbilical hernia contains a short segment of small bowel without complicating features. 4. Ill-defined very subtle area of decreased attenuation in the posterior aspect of hepatic segment IV. This is likely focal fatty deposition. Close attention on follow-up recommended. 5.  Aortic Atherosclerois (ICD10-170.0)  REVIEW OF SYSTEMS:   Constitutional: Denies fevers, chills  Eyes: Denies blurriness of vision Ears, nose, mouth, throat, and face: Denies mucositis or sore throat Respiratory: Denies cough, dyspnea or wheezes Cardiovascular: Denies palpitation, chest discomfort or lower extremity swelling Gastrointestinal:  Denies nausea, heartburn or change in bowel habits Skin: Denies abnormal skin rashes Lymphatics: Denies new lymphadenopathy or easy bruising Neurological:Denies numbness, tingling or new weaknesses Behavioral/Psych: Mood is stable, no new changes  All other systems were reviewed with the patient and are negative.  I have reviewed the past medical history, past surgical history, social history and family history with the patient and they are unchanged from previous note.  ALLERGIES:  is allergic to codeine; penicillins; and bactrim [sulfamethoxazole-trimethoprim].  MEDICATIONS:  Current Outpatient Medications  Medication Sig Dispense Refill  . Cetirizine HCl (ZYRTEC PO) Take by mouth.    . citalopram (CELEXA) 20 MG tablet Take 1 tablet (20 mg total) by mouth daily. 30 tablet 11  . CREON 24000-76000 units CPEP TAKE BY MOUTH 1 CAP BEFORE EACH MEAL AND 1 CAP AFTER EACH MEAL AND 1 CAP WITH SNACKS.    . diclofenac Sodium (VOLTAREN) 1 % GEL Apply 2-4 grams to affected joint 4 times daily as needed. 400 g 1  . estradiol (ESTRACE VAGINAL) 0.1 MG/GM vaginal cream Place 1 Applicatorful vaginally 3 (three) times a week. 42.5 g 12  . fluticasone (FLONASE) 50 MCG/ACT nasal spray Place 1 spray into both nostrils daily.     . folic acid (FOLVITE) 1 MG tablet Take 2 tablets (2 mg total) by mouth daily. 180 tablet 3  . gabapentin (NEURONTIN) 300 MG capsule Take 1 capsule (300 mg total) by mouth 3 (three) times daily. 90 capsule 3  . HYDROmorphone (DILAUDID) 4 MG tablet Take 1 tablet (4 mg total) by mouth every 6 (six) hours as needed for severe pain. 60 tablet 0  . hydrOXYzine (ATARAX/VISTARIL) 10 MG tablet Take 1 tablet (10 mg total) by mouth 3 (three) times daily as needed. 30 tablet 1  . lidocaine-prilocaine (EMLA) cream Apply 1 application topically daily as needed (port). Apply to affected area once 30 g 11  . metFORMIN (GLUCOPHAGE) 500 MG tablet TAKE 1 TABLET (500 MG TOTAL) BY MOUTH 2 (TWO) TIMES DAILY WITH A MEAL. (Patient taking differently: Take 500 mg by mouth 2 (two) times daily with a meal. ) 60 tablet 1  . methadone (DOLOPHINE) 10 MG tablet Take 2 tablets (20 mg total) by mouth every 12 (twelve) hours. 60 tablet 0  . methocarbamol (ROBAXIN) 500 MG tablet TAKE 1 TABLET BY MOUTH 3 TIMES DAILY AS NEEDED FOR MUSCLE SPASMS. 90 tablet 0  . Multiple Vitamin (MULTIVITAMIN WITH MINERALS) TABS tablet Take 1 tablet by mouth daily.    Marland Kitchen omeprazole (PRILOSEC) 40 MG capsule Take 1 capsule (40 mg total) by mouth daily. 30 capsule 11  . ORENCIA CLICKJECT 268 MG/ML SOAJ INJECT ONE CLICKJECT PEN SUBCUTANEOUSLY EVERY WEEK. REFRIGERATE. ALLOW TO WARM TO ROOM TEMPERATURE PRIOR TO ADMINISTRATION. 12 mL 0  . OTREXUP 20 MG/0.4ML SOAJ INJECT ONE PEN SUBCUTANEOUSLY ONCE EVERY WEEK. STORE AT ROOM TEMPERATURE BETWEEN 68 - 77 DEGREES F. 12 pen 0  . predniSONE (DELTASONE) 1 MG tablet Take 4 tablets (4 mg total) by mouth daily with breakfast. 240 tablet 0  . simethicone (MYLICON) 341 MG chewable tablet Chew 125 mg by mouth every 6 (six) hours as needed for flatulence.    . traMADol (ULTRAM) 50 MG tablet Take 1 tablet (50 mg total)  by mouth every 6 (six) hours as needed for moderate pain. 60 tablet 0   No current facility-administered  medications for this visit.    PHYSICAL EXAMINATION: ECOG PERFORMANCE STATUS: 1 - Symptomatic but completely ambulatory  Vitals:   05/29/19 0929  BP: 128/81  Pulse: 74  Resp: 18  Temp: 98.3 F (36.8 C)  SpO2: 99%   Filed Weights   05/29/19 0929  Weight: 182 lb 9.6 oz (82.8 kg)    GENERAL:alert, no distress and comfortable NEURO: alert & oriented x 3 with fluent speech, no focal motor/sensory deficits  LABORATORY DATA:  I have reviewed the data as listed    Component Value Date/Time   NA 140 05/28/2019 1212   K 4.2 05/28/2019 1212   CL 100 05/28/2019 1212   CO2 26 05/28/2019 1212   GLUCOSE 104 (H) 05/28/2019 1212   BUN 10 05/28/2019 1212   CREATININE 0.64 05/28/2019 1212   CREATININE 0.71 05/23/2018 0905   CREATININE 0.68 07/27/2017 0851   CALCIUM 9.3 05/28/2019 1212   PROT 7.2 05/28/2019 1212   ALBUMIN 3.2 (L) 05/28/2019 1212   AST 19 05/28/2019 1212   AST 24 05/23/2018 0905   ALT 21 05/28/2019 1212   ALT 49 (H) 05/23/2018 0905   ALKPHOS 88 05/28/2019 1212   BILITOT <0.2 (L) 05/28/2019 1212   BILITOT 0.2 (L) 05/23/2018 0905   GFRNONAA >60 05/28/2019 1212   GFRNONAA >60 05/23/2018 0905   GFRNONAA 106 07/27/2017 0851   GFRAA >60 05/28/2019 1212   GFRAA >60 05/23/2018 0905   GFRAA 123 07/27/2017 0851    No results found for: SPEP, UPEP  Lab Results  Component Value Date   WBC 11.6 (H) 05/28/2019   NEUTROABS 7.4 05/28/2019   HGB 11.5 (L) 05/28/2019   HCT 36.6 05/28/2019   MCV 90.4 05/28/2019   PLT 658 (H) 05/28/2019      Chemistry      Component Value Date/Time   NA 140 05/28/2019 1212   K 4.2 05/28/2019 1212   CL 100 05/28/2019 1212   CO2 26 05/28/2019 1212   BUN 10 05/28/2019 1212   CREATININE 0.64 05/28/2019 1212   CREATININE 0.71 05/23/2018 0905   CREATININE 0.68 07/27/2017 0851      Component Value Date/Time   CALCIUM 9.3 05/28/2019 1212   ALKPHOS 88 05/28/2019 1212   AST 19 05/28/2019 1212   AST 24 05/23/2018 0905   ALT 21  05/28/2019 1212   ALT 49 (H) 05/23/2018 0905   BILITOT <0.2 (L) 05/28/2019 1212   BILITOT 0.2 (L) 05/23/2018 0905       RADIOGRAPHIC STUDIES: I have reviewed imaging studies with the patient I have reviewed I have personally reviewed the radiological images as listed and agreed with the findings in the report. DG Chest 2 View  Result Date: 05/02/2019 CLINICAL DATA:  Low-grade fever, chills EXAM: CHEST - 2 VIEW COMPARISON:  02/14/2018 FINDINGS: Right Port-A-Cath remains in place, unchanged. Heart and mediastinal contours are within normal limits. No focal opacities or effusions. No acute bony abnormality. IMPRESSION: No active cardiopulmonary disease. Electronically Signed   By: Rolm Baptise M.D.   On: 05/02/2019 11:48   CT ABDOMEN PELVIS W CONTRAST  Result Date: 05/28/2019 CLINICAL DATA:  Pancreatic cancer, surveillance pancreatic ca, abnormal CT on 2/23 with abdominal pain, reassess, cancer versus pancreatitis. Also with a history of uterine cancer. EXAM: CT ABDOMEN AND PELVIS WITH CONTRAST TECHNIQUE: Multidetector CT imaging of the abdomen and pelvis was performed using the  standard protocol following bolus administration of intravenous contrast. CONTRAST:  159m OMNIPAQUE IOHEXOL 300 MG/ML  SOLN COMPARISON:  04/17/2019 FINDINGS: Lower chest: Unremarkable Hepatobiliary: 2.9 cm heterogeneously enhancing lesion in the anterior hepatic dome is stable in the interval since prior study and was characterized as cavernous hemangioma on MRI of 09/16/2017. Subtle decreased attenuation posterior segment IV (34/2) likely related to fatty deposition. Gallbladder is surgically absent. No intrahepatic or extrahepatic biliary dilation. Pancreas: Status post distal pancreatectomy. The small low-density lesion anterior to the pancreatic neck is stable in size in the interval but has some peripheral calcification on the current study (image 50/2). Ill-defined soft tissue to the left of the celiac axis is similar to  prior measuring adjacent to the SMA as on prior study, this abnormal soft tissue measures 1.9 x 1.9 cm today compared to 1.8 x 0.8 cm previously. This soft tissue abuts the posterior wall of the mid stomach. Spleen: Surgically absent. Adrenals/Urinary Tract: Right adrenal gland unremarkable. Left adrenal gland in close proximity and potentially involved by the abnormal retroperitoneal soft tissue described above in the pancreatectomy bed. Stomach/Bowel: Stomach is unremarkable. No gastric wall thickening. No evidence of outlet obstruction. Duodenum is normally positioned as is the ligament of Treitz. No small bowel wall thickening. No small bowel dilatation. Status post right hemicolectomy. No gross colonic mass. No colonic wall thickening. Vascular/Lymphatic: There is abdominal aortic atherosclerosis without aneurysm. No pelvic sidewall lymphadenopathy. Reproductive: The uterus is surgically absent. There is no adnexal mass. Other: Areas of ill-defined nodularity identified in the expected location of the omentum although the patient may be status post omentectomy. Representative examples visible on image 46/7 and in the left anterior abdomen on 58/7. No intraperitoneal free fluid. Musculoskeletal: No worrisome lytic or sclerotic osseous abnormality. Left paraumbilical hernia contains a loop of small bowel without complicating features. IMPRESSION: 1. Status post distal pancreatectomy with continued further progression of the ill-defined soft tissue to the left of the celiac axis/SMA origin, now measuring 1.9 x 1.9 cm and highly concerning for recurrent/metastatic disease. This process abuts both the celiac axis, SMA, and posterior wall of the mid stomach. PET-CT may prove helpful to further evaluate. 2. Bandlike ill-defined soft tissue in the anterior abdomen, potentially peritoneal or omental is similar to perhaps minimally increased qualitatively in the interval. Close attention on follow-up recommended. 3. Left  paraumbilical hernia contains a short segment of small bowel without complicating features. 4. Ill-defined very subtle area of decreased attenuation in the posterior aspect of hepatic segment IV. This is likely focal fatty deposition. Close attention on follow-up recommended. 5.  Aortic Atherosclerois (ICD10-170.0) Electronically Signed   By: EMisty StanleyM.D.   On: 05/28/2019 14:46

## 2019-05-29 NOTE — Assessment & Plan Note (Signed)
I have reviewed blood work and imaging studies with the patient I am concerned about local recurrence of pancreatic cancer I reviewed her CT imaging with the radiologist and the peritoneal nodularity seen on CT imaging is not enough to get a good biopsy Ultimately, I recommend referral back to her gastroenterologist for EUS and biopsy I plan to see her back within a few days after the biopsy to review results and plan of care The patient is aware, if she is found to have recurrence of disease, her disease is considered incurable and she will need to be on palliative chemotherapy lifelong We briefly discussed the choice of treatment but did not go into the fine details

## 2019-05-30 ENCOUNTER — Telehealth: Payer: Self-pay | Admitting: Hematology and Oncology

## 2019-05-30 NOTE — Telephone Encounter (Signed)
EUS scheduled, pt instructed and medications reviewed.  Patient instructions sent to the pt via My Chart as well.  Patient to call with any questions or concerns.

## 2019-05-30 NOTE — Telephone Encounter (Signed)
Scheduled appt per 4/6 sch message - pt aware of appt date and time 

## 2019-05-31 DIAGNOSIS — C252 Malignant neoplasm of tail of pancreas: Secondary | ICD-10-CM | POA: Diagnosis not present

## 2019-05-31 DIAGNOSIS — C182 Malignant neoplasm of ascending colon: Secondary | ICD-10-CM | POA: Diagnosis not present

## 2019-06-04 ENCOUNTER — Other Ambulatory Visit (HOSPITAL_COMMUNITY)
Admission: RE | Admit: 2019-06-04 | Discharge: 2019-06-04 | Disposition: A | Discharge status: Home / Self Care | Payer: BC Managed Care – PPO | Source: Ambulatory Visit | Attending: Gastroenterology | Admitting: Gastroenterology

## 2019-06-04 DIAGNOSIS — Z01812 Encounter for preprocedural laboratory examination: Secondary | ICD-10-CM | POA: Diagnosis not present

## 2019-06-04 DIAGNOSIS — Z20822 Contact with and (suspected) exposure to covid-19: Secondary | ICD-10-CM | POA: Insufficient documentation

## 2019-06-04 LAB — SARS CORONAVIRUS 2 (TAT 6-24 HRS): SARS Coronavirus 2: NEGATIVE

## 2019-06-07 ENCOUNTER — Ambulatory Visit (HOSPITAL_COMMUNITY): Payer: BC Managed Care – PPO | Admitting: Anesthesiology

## 2019-06-07 ENCOUNTER — Other Ambulatory Visit: Payer: Self-pay

## 2019-06-07 ENCOUNTER — Encounter (HOSPITAL_COMMUNITY): Payer: Self-pay | Admitting: Gastroenterology

## 2019-06-07 ENCOUNTER — Ambulatory Visit (HOSPITAL_COMMUNITY)
Admission: RE | Admit: 2019-06-07 | Discharge: 2019-06-07 | Disposition: A | Discharge status: Home / Self Care | Payer: BC Managed Care – PPO | Attending: Gastroenterology | Admitting: Gastroenterology

## 2019-06-07 ENCOUNTER — Encounter (HOSPITAL_COMMUNITY)
Admission: RE | Disposition: A | Discharge status: Home / Self Care | Payer: Self-pay | Source: Home / Self Care | Attending: Gastroenterology

## 2019-06-07 DIAGNOSIS — Z1509 Genetic susceptibility to other malignant neoplasm: Secondary | ICD-10-CM | POA: Diagnosis not present

## 2019-06-07 DIAGNOSIS — K760 Fatty (change of) liver, not elsewhere classified: Secondary | ICD-10-CM | POA: Insufficient documentation

## 2019-06-07 DIAGNOSIS — I700 Atherosclerosis of aorta: Secondary | ICD-10-CM | POA: Insufficient documentation

## 2019-06-07 DIAGNOSIS — Z6837 Body mass index (BMI) 37.0-37.9, adult: Secondary | ICD-10-CM | POA: Diagnosis not present

## 2019-06-07 DIAGNOSIS — Z7984 Long term (current) use of oral hypoglycemic drugs: Secondary | ICD-10-CM | POA: Diagnosis not present

## 2019-06-07 DIAGNOSIS — Z87891 Personal history of nicotine dependence: Secondary | ICD-10-CM | POA: Diagnosis not present

## 2019-06-07 DIAGNOSIS — Z7952 Long term (current) use of systemic steroids: Secondary | ICD-10-CM | POA: Diagnosis not present

## 2019-06-07 DIAGNOSIS — R933 Abnormal findings on diagnostic imaging of other parts of digestive tract: Secondary | ICD-10-CM | POA: Diagnosis not present

## 2019-06-07 DIAGNOSIS — Z8543 Personal history of malignant neoplasm of ovary: Secondary | ICD-10-CM | POA: Insufficient documentation

## 2019-06-07 DIAGNOSIS — K8689 Other specified diseases of pancreas: Secondary | ICD-10-CM

## 2019-06-07 DIAGNOSIS — C252 Malignant neoplasm of tail of pancreas: Secondary | ICD-10-CM | POA: Insufficient documentation

## 2019-06-07 DIAGNOSIS — G620 Drug-induced polyneuropathy: Secondary | ICD-10-CM | POA: Diagnosis not present

## 2019-06-07 DIAGNOSIS — Z79899 Other long term (current) drug therapy: Secondary | ICD-10-CM | POA: Insufficient documentation

## 2019-06-07 DIAGNOSIS — C7961 Secondary malignant neoplasm of right ovary: Secondary | ICD-10-CM | POA: Insufficient documentation

## 2019-06-07 DIAGNOSIS — M069 Rheumatoid arthritis, unspecified: Secondary | ICD-10-CM | POA: Insufficient documentation

## 2019-06-07 DIAGNOSIS — K429 Umbilical hernia without obstruction or gangrene: Secondary | ICD-10-CM | POA: Insufficient documentation

## 2019-06-07 DIAGNOSIS — Z8542 Personal history of malignant neoplasm of other parts of uterus: Secondary | ICD-10-CM | POA: Diagnosis not present

## 2019-06-07 DIAGNOSIS — E119 Type 2 diabetes mellitus without complications: Secondary | ICD-10-CM | POA: Diagnosis not present

## 2019-06-07 DIAGNOSIS — F329 Major depressive disorder, single episode, unspecified: Secondary | ICD-10-CM | POA: Diagnosis not present

## 2019-06-07 DIAGNOSIS — R1900 Intra-abdominal and pelvic swelling, mass and lump, unspecified site: Secondary | ICD-10-CM

## 2019-06-07 DIAGNOSIS — Z8507 Personal history of malignant neoplasm of pancreas: Secondary | ICD-10-CM

## 2019-06-07 HISTORY — PX: ESOPHAGOGASTRODUODENOSCOPY (EGD) WITH PROPOFOL: SHX5813

## 2019-06-07 HISTORY — PX: FINE NEEDLE ASPIRATION: SHX5430

## 2019-06-07 HISTORY — PX: EUS: SHX5427

## 2019-06-07 LAB — GLUCOSE, CAPILLARY: Glucose-Capillary: 71 mg/dL (ref 70–99)

## 2019-06-07 SURGERY — ESOPHAGOGASTRODUODENOSCOPY (EGD) WITH PROPOFOL
Anesthesia: Monitor Anesthesia Care

## 2019-06-07 MED ORDER — PROPOFOL 10 MG/ML IV BOLUS
INTRAVENOUS | Status: DC | PRN
  Administered 2019-06-07 (×2): 20 mg via INTRAVENOUS
  Administered 2019-06-07: 40 mg via INTRAVENOUS
  Administered 2019-06-07: 20 mg via INTRAVENOUS

## 2019-06-07 MED ORDER — MIDAZOLAM HCL 5 MG/5ML IJ SOLN
INTRAMUSCULAR | Status: DC | PRN
  Administered 2019-06-07: 2 mg via INTRAVENOUS

## 2019-06-07 MED ORDER — LACTATED RINGERS IV SOLN
INTRAVENOUS | Status: AC | PRN
  Administered 2019-06-07: 1000 mL via INTRAVENOUS

## 2019-06-07 MED ORDER — PROPOFOL 500 MG/50ML IV EMUL
INTRAVENOUS | Status: DC | PRN
  Administered 2019-06-07: 100 ug/kg/min via INTRAVENOUS

## 2019-06-07 MED ORDER — PROPOFOL 10 MG/ML IV BOLUS
INTRAVENOUS | Status: AC
  Filled 2019-06-07: qty 20

## 2019-06-07 MED ORDER — LIDOCAINE 2% (20 MG/ML) 5 ML SYRINGE
INTRAMUSCULAR | Status: DC | PRN
  Administered 2019-06-07 (×2): 50 mg via INTRAVENOUS

## 2019-06-07 MED ORDER — SODIUM CHLORIDE 0.9 % IV SOLN: INTRAVENOUS | Status: DC

## 2019-06-07 MED ORDER — ONDANSETRON HCL 4 MG/2ML IJ SOLN
INTRAMUSCULAR | Status: DC | PRN
  Administered 2019-06-07: 4 mg via INTRAVENOUS

## 2019-06-07 MED ORDER — PROPOFOL 500 MG/50ML IV EMUL
INTRAVENOUS | Status: AC
  Filled 2019-06-07: qty 50

## 2019-06-07 MED ORDER — MIDAZOLAM HCL 2 MG/2ML IJ SOLN
INTRAMUSCULAR | Status: AC
  Filled 2019-06-07: qty 2

## 2019-06-07 SURGICAL SUPPLY — 15 items

## 2019-06-07 NOTE — Op Note (Signed)
[facility] Patient Name: Dana Hicks Procedure Date: 06/07/2019 MRN: [id number] Attending MD: [name] , MD Date of Birth: 02-05-1976 CSN: [id number] Age: 44 Admit Type: Outpatient Procedure:                Upper EUS Indications:              Lynch Syndrome, history colon, ovarian and                            endometrial cancers. Most recently tail of T2N0M0                            tail of pancreas adenocarcinoma resected at [facility] with                            Dr. Carlis Abbott 11/2017. Serial imaging is concering for                            recurrence of cancer in the region of the tail of                            pancreas resection site. Providers:                [name], MD, Jobe Igo, RN, Theodora Blow, Technician Referring MD:             Heath Lark, MD; Bailey Mech, MD Medicines:                Monitored Anesthesia Care Complications:            No immediate complications. Estimated blood loss:                            None. Estimated Blood Loss:     Estimated blood loss: none. Procedure:                Pre-Anesthesia Assessment:                           - Prior to the procedure, a History and Physical                            was performed, and patient medications and                            allergies were reviewed. The patient's tolerance of                            previous anesthesia was also reviewed. The risks                            and benefits of the procedure and the sedation  options and risks were discussed with the patient.                            All questions were answered, and informed consent                            was obtained. Prior Anticoagulants: The patient has                            taken no previous anticoagulant or antiplatelet                            agents. ASA Grade Assessment: II - A patient with                            mild  systemic disease. After reviewing the risks                            and benefits, the patient was deemed in                            satisfactory condition to undergo the procedure.                           After obtaining informed consent, the endoscope was                            passed under direct vision. Throughout the                            procedure, the patient's blood pressure, pulse, and                            oxygen saturations were monitored continuously. The                            [id number]) Olympus Linear EUS was                            introduced through the mouth, and advanced to the                            antrum of the stomach. The upper EUS was                            accomplished without difficulty. The patient                            tolerated the procedure well. Scope In: Scope Out: Findings:      ENDOSCOPIC FINDING (limited views with linear echoendoscope): :      The examined esophagus was endoscopically normal.      The entire examined stomach was endoscopically normal.      ENDOSONOGRAPHIC FINDING: :  1. An irregular mass was identified in the region of the pancreatic tail       resection site. The mass was stellate with very poorly defined borders.       Fine needle aspiration for cytology was performed. Color Doppler imaging       was utilized prior to needle puncture to confirm a lack of significant       vascular structures within the needle path. Four passes were made with       the 25 gauge (FNB) needle using a transgastric approach. A       cytotechnologist was present to evaluate the adequacy of the specimen.       Final cytology results are pending. Impression:               - Irregular, stellate soft tissue mass in the                            region of the pancreatic tail resection site. This                            was sampled with four transgastric passes with an                            EUS FNB  needle.. Moderate Sedation:      Not Applicable - Patient had care per Anesthesia. Recommendation:           - Discharge patient to home. Procedure Code(s):        --- Professional ---                           [phone], 23, Esophagogastroduodenoscopy, flexible,                            transoral; with transendoscopic ultrasound-guided                            intramural or transmural fine needle                            aspiration/biopsy(s), (includes endoscopic                            ultrasound examination limited to the esophagus,                            stomach or duodenum, and adjacent structures) Diagnosis Code(s):        --- Professional ---                           K86.89, Other specified diseases of pancreas                           R93.3, Abnormal findings on diagnostic imaging of                            other parts of digestive tract CPT copyright 2019 [employer]. All rights reserved. The codes  documented in this report are preliminary and upon coder review may  be revised to meet current compliance requirements. [name], MD 06/07/2019 12:30:55 PM This report has been signed electronically. Number of Addenda: 0

## 2019-06-07 NOTE — Transfer of Care (Signed)
Immediate Anesthesia Transfer of Care Note  Patient: SHANTI AGRESTI  Procedure(s) Performed: ESOPHAGOGASTRODUODENOSCOPY (EGD) WITH PROPOFOL (N/A ) UPPER ENDOSCOPIC ULTRASOUND (EUS) LINEAR (N/A ) FINE NEEDLE ASPIRATION (FNA) LINEAR (N/A )  Patient Location: PACU and Endoscopy Unit  Anesthesia Type:MAC  Level of Consciousness: awake, alert  and patient cooperative  Airway & Oxygen Therapy: Patient Spontanous Breathing and Patient connected to nasal cannula oxygen  Post-op Assessment: Report given to RN and Post -op Vital signs reviewed and stable  Post vital signs: Reviewed and stable  Last Vitals:  Vitals Value Taken Time  BP    Temp    Pulse 79 06/07/19 1231  Resp 19 06/07/19 1231  SpO2 100 % 06/07/19 1231  Vitals shown include unvalidated device data.  Last Pain:  Vitals:   06/07/19 1049  TempSrc: Oral  PainSc: 0-No pain         Complications: No apparent anesthesia complications

## 2019-06-07 NOTE — Anesthesia Procedure Notes (Signed)
Procedure Name: MAC Date/Time: 06/07/2019 11:33 AM Performed by: Lollie Sails, CRNA Pre-anesthesia Checklist: Patient identified, Emergency Drugs available, Suction available, Patient being monitored and Timeout performed Oxygen Delivery Method: Nasal cannula

## 2019-06-07 NOTE — Anesthesia Preprocedure Evaluation (Signed)
Anesthesia Evaluation  Patient identified by MRN, date of birth, ID band Patient awake    Reviewed: Allergy & Precautions, NPO status , Patient's Chart, lab work & pertinent test results  Airway Mallampati: II  TM Distance: >3 FB Neck ROM: Full    Dental no notable dental hx. 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    GI/Hepatic Neg liver ROS, GERD  Medicated,Hx pancreatic ca, CRC   Endo/Other  diabetes, Oral Hypoglycemic AgentsMorbid obesityObesity BMI 37  Renal/GU Renal diseasenephrolithiasis   Hx endometrial cancer, ovarian ca    Musculoskeletal  (+) Arthritis , Rheumatoid disorders and steroids,  Oral prednisone, methotrexate injection q3 weeks   Abdominal Normal abdominal exam  (+) + obese,   Peds  Hematology  (+) anemia ,   Anesthesia Other Findings   Reproductive/Obstetrics negative OB ROS                             Anesthesia Physical  Anesthesia Plan  ASA: III  Anesthesia Plan: MAC   Post-op Pain Management:    Induction: Intravenous  PONV Risk Score and Plan: 2 and Treatment may vary due to age or medical condition, TIVA, Propofol infusion, Midazolam and Ondansetron  Airway Management Planned: Natural Airway  Additional Equipment:   Intra-op Plan:   Post-operative Plan:   Informed Consent: I have reviewed the patients History and Physical, chart, labs and discussed the procedure including the risks, benefits and alternatives for the proposed anesthesia with the patient or authorized representative who has indicated his/her understanding and acceptance.     Dental advisory given  Plan Discussed  with: CRNA  Anesthesia Plan Comments:         Anesthesia Quick Evaluation

## 2019-06-07 NOTE — Interval H&P Note (Signed)
History and Physical Interval Note:  06/07/2019 11:04 AM  Dana Hicks  has presented today for surgery, with the diagnosis of history of pancreatic cancer, abd mass.  The various methods of treatment have been discussed with the patient and family. After consideration of risks, benefits and other options for treatment, the patient has consented to  Procedure(s): UPPER ENDOSCOPIC ULTRASOUND (EUS) RADIAL (N/A) ESOPHAGOGASTRODUODENOSCOPY (EGD) WITH PROPOFOL (N/A) as a surgical intervention.  The patient's history has been reviewed, patient examined, no change in status, stable for surgery.  I have reviewed the patient's chart and labs.  Questions were answered to the patient's satisfaction.     [name]

## 2019-06-07 NOTE — Anesthesia Postprocedure Evaluation (Signed)
Anesthesia Post Note  Patient: Dana Hicks  Procedure(s) Performed: ESOPHAGOGASTRODUODENOSCOPY (EGD) WITH PROPOFOL (N/A ) UPPER ENDOSCOPIC ULTRASOUND (EUS) LINEAR (N/A ) FINE NEEDLE ASPIRATION (FNA) LINEAR (N/A )     Patient location during evaluation: PACU Anesthesia Type: MAC Level of consciousness: awake and alert Pain management: pain level controlled Vital Signs Assessment: post-procedure vital signs reviewed and stable Respiratory status: spontaneous breathing Cardiovascular status: stable Anesthetic complications: no    Last Vitals:  Vitals:   06/07/19 1240 06/07/19 1250  BP: 119/71 112/73  Pulse: (!) 123 75  Resp: 19 15  Temp:    SpO2: 98% 98%    Last Pain:  Vitals:   06/07/19 1250  TempSrc:   PainSc: 0-No pain                 Nolon Nations

## 2019-06-07 NOTE — Discharge Instructions (Signed)
YOU HAD AN ENDOSCOPIC PROCEDURE TODAY: Refer to the procedure report and other information in the discharge instructions given to you for any specific questions about what was found during the examination. If this information does not answer your questions, please call [place] office at [phone] to clarify.  ° °YOU SHOULD EXPECT: Some feelings of bloating in the abdomen. Passage of more gas than usual. Walking can help get rid of the air that was put into your GI tract during the procedure and reduce the bloating. If you had a lower endoscopy (such as a colonoscopy or flexible sigmoidoscopy) you may notice spotting of blood in your stool or on the toilet paper. Some abdominal soreness may be present for a day or two, also. ° °DIET: Your first meal following the procedure should be a light meal and then it is ok to progress to your normal diet. A half-sandwich or bowl of soup is an example of a good first meal. Heavy or fried foods are harder to digest and may make you feel nauseous or bloated. Drink plenty of fluids but you should avoid alcoholic beverages for 24 hours. If you had a esophageal dilation, please see attached instructions for diet.   ° °ACTIVITY: Your care partner should take you home directly after the procedure. You should plan to take it easy, moving slowly for the rest of the day. You can resume normal activity the day after the procedure however YOU SHOULD NOT DRIVE, use power tools, machinery or perform tasks that involve climbing or major physical exertion for 24 hours (because of the sedation medicines used during the test).  ° °SYMPTOMS TO REPORT IMMEDIATELY: °A gastroenterologist can be reached at any hour. Please call [phone]  for any of the following symptoms:  °Following lower endoscopy (colonoscopy, flexible sigmoidoscopy) °Excessive amounts of blood in the stool  °Significant tenderness, worsening of abdominal pains  °Swelling of the abdomen that is new, acute  °Fever of 100° or  higher  °Following upper endoscopy (EGD, EUS, ERCP, esophageal dilation) °Vomiting of blood or coffee ground material  °New, significant abdominal pain  °New, significant chest pain or pain under the shoulder blades  °Painful or persistently difficult swallowing  °New shortness of breath  °Black, tarry-looking or red, bloody stools ° °FOLLOW UP:  °If any biopsies were taken you will be contacted by phone or by letter within the next 1-3 weeks. Call [phone]  if you have not heard about the biopsies in 3 weeks.  °Please also call with any specific questions about appointments or follow up tests. ° °

## 2019-06-08 ENCOUNTER — Encounter: Payer: Self-pay | Admitting: *Deleted

## 2019-06-08 LAB — CYTOLOGY - NON PAP

## 2019-06-11 ENCOUNTER — Inpatient Hospital Stay (HOSPITAL_BASED_OUTPATIENT_CLINIC_OR_DEPARTMENT_OTHER): Payer: BC Managed Care – PPO | Admitting: Hematology and Oncology

## 2019-06-11 ENCOUNTER — Encounter: Payer: Self-pay | Admitting: Hematology and Oncology

## 2019-06-11 ENCOUNTER — Other Ambulatory Visit: Payer: Self-pay

## 2019-06-11 VITALS — BP 122/66 | HR 86 | Temp 98.0°F | Resp 18 | Ht 62.0 in | Wt 183.4 lb

## 2019-06-11 DIAGNOSIS — Z7189 Other specified counseling: Secondary | ICD-10-CM | POA: Diagnosis not present

## 2019-06-11 DIAGNOSIS — Z5111 Encounter for antineoplastic chemotherapy: Secondary | ICD-10-CM | POA: Diagnosis not present

## 2019-06-11 DIAGNOSIS — C541 Malignant neoplasm of endometrium: Secondary | ICD-10-CM | POA: Diagnosis not present

## 2019-06-11 DIAGNOSIS — C252 Malignant neoplasm of tail of pancreas: Secondary | ICD-10-CM

## 2019-06-11 DIAGNOSIS — R109 Unspecified abdominal pain: Secondary | ICD-10-CM | POA: Diagnosis not present

## 2019-06-11 DIAGNOSIS — M069 Rheumatoid arthritis, unspecified: Secondary | ICD-10-CM | POA: Diagnosis not present

## 2019-06-11 DIAGNOSIS — Z79899 Other long term (current) drug therapy: Secondary | ICD-10-CM | POA: Diagnosis not present

## 2019-06-11 DIAGNOSIS — R634 Abnormal weight loss: Secondary | ICD-10-CM | POA: Diagnosis not present

## 2019-06-11 DIAGNOSIS — M0609 Rheumatoid arthritis without rheumatoid factor, multiple sites: Secondary | ICD-10-CM

## 2019-06-11 DIAGNOSIS — G620 Drug-induced polyneuropathy: Secondary | ICD-10-CM | POA: Diagnosis not present

## 2019-06-11 DIAGNOSIS — C7961 Secondary malignant neoplasm of right ovary: Secondary | ICD-10-CM | POA: Diagnosis not present

## 2019-06-11 NOTE — Assessment & Plan Note (Signed)
We have numerous goals of care discussions in the past With recurrent pancreatic cancer, she is aware that this is almost a universal fatal disease I recommend her to apply for permanent disability due to long-term future chemotherapy We discussed advanced directives and living will

## 2019-06-11 NOTE — Assessment & Plan Note (Signed)
I reviewed final pathology report with the patient I also spoke with her surgeon from [facility] She understood that her disease is considered unresectable  We reviewed the current guidelines The best treatment out there which she tolerated fairly well, I will be it with significant dose adjustment, would be FOLFIRINOX at modification Based on her previous tolerance to treatment, I plan to modify the dose of oxaliplatin, irinotecan and 5-FU upfront She will need G-CSF support I will also add prophylactic treatment with atropine before hand Previously, she struggle with hydration issue I will consult advanced home care service to help manage IV fluids at home, which she needed at least 3 times a week She understood that the goals of treatment is palliative in nature The risks, benefits, side effects of chemotherapy including risk of pancytopenia, nausea, vomiting, peripheral neuropathy, risks of infection, allergic reaction and death were discussed and she agreed to proceed She will come back at the end of the week to get her labs done and start treatment next week In the future, due to long treatment/infusion time, I will see her today before treatment for lab work, physical examination and toxicity review I recommend minimum 4 cycles of treatment before repeat imaging study She is aware she needs to take time off work In fact, I recommend her to apply for permanent disability

## 2019-06-11 NOTE — Assessment & Plan Note (Signed)
Due to plan to resume chemotherapy, she needs to stay off all immunosuppressants She is in agreement She will contact her rheumatologist about this

## 2019-06-11 NOTE — Assessment & Plan Note (Signed)
Her abdominal pain is due to cancer She is comfortable with current regimen We discussed narcotic refill policy

## 2019-06-11 NOTE — Progress Notes (Signed)
ON PATHWAY REGIMEN - Pancreatic  No Change  Continue With Treatment as Ordered.     A cycle is every 14 days:     Oxaliplatin      Leucovorin      Irinotecan      5-Fluorouracil   **Always confirm dose/schedule in your pharmacy ordering system**  Patient Characteristics: Adenocarcinoma, Resectable, Adjuvant, Negative Margins (Includes Positive Lymph Nodes) Histology: Adenocarcinoma Current evidence of distant metastases<= No AJCC T Category: T1b AJCC N Category: N0 AJCC M Category: M0 AJCC 8 Stage Grouping: IA Treatment Setting: Adjuvant Intent of Therapy: Curative Intent, Discussed with Patient

## 2019-06-11 NOTE — Progress Notes (Signed)
[facility] OFFICE PROGRESS NOTE  Patient Care Team: Morrow, Aaron, MD as PCP - General (Family Medicine)  ASSESSMENT & PLAN:  Pancreatic cancer (HCC) I reviewed final pathology report with the patient I also spoke with her surgeon from [place] She understood that her disease is considered unresectable  We reviewed the current guidelines The best treatment out there which she tolerated fairly well, I will be it with significant dose adjustment, would be FOLFIRINOX at modification Based on her previous tolerance to treatment, I plan to modify the dose of oxaliplatin, irinotecan and 5-FU upfront She will need G-CSF support I will also add prophylactic treatment with atropine before hand Previously, she struggle with hydration issue I will consult advanced home care service to help manage IV fluids at home, which she needed at least 3 times a week She understood that the goals of treatment is palliative in nature The risks, benefits, side effects of chemotherapy including risk of pancytopenia, nausea, vomiting, peripheral neuropathy, risks of infection, allergic reaction and death were discussed and she agreed to proceed She will come back at the end of the week to get her labs done and start treatment next week In the future, due to long treatment/infusion time, I will see her today before treatment for lab work, physical examination and toxicity review I recommend minimum 4 cycles of treatment before repeat imaging study She is aware she needs to take time off work In fact, I recommend her to apply for permanent disability  Abdominal pain Her abdominal pain is due to cancer She is comfortable with current regimen We discussed narcotic refill policy  Rheumatoid arthritis of multiple sites with negative rheumatoid factor (HCC) Due to plan to resume chemotherapy, she needs to stay off all immunosuppressants She is in agreement She will contact her rheumatologist about  this  Goals of care, counseling/discussion We have numerous goals of care discussions in the past With recurrent pancreatic cancer, she is aware that this is almost a universal fatal disease I recommend her to apply for permanent disability due to long-term future chemotherapy We discussed advanced directives and living will   Orders Placed This Encounter  Procedures  . CBC with Differential (Cancer Center Only)    Standing Status:   Standing    Number of Occurrences:   20    Standing Expiration Date:   06/10/2020  . CMP (Cancer Center only)    Standing Status:   Standing    Number of Occurrences:   20    Standing Expiration Date:   06/10/2020  . Ambulatory referral to Home Health    Referral Priority:   Routine    Referral Type:   Home Health Care    Referral Reason:   Specialty Services Required    Requested Specialty:   Home Health Services    Number of Visits Requested:   1    All questions were answered. The patient knows to call the clinic with any problems, questions or concerns. The total time spent in the appointment was 40 minutes encounter with patients including review of chart and various tests results, discussions about plan of care and coordination of care plan    , MD 06/11/2019 12:20 PM  INTERVAL HISTORY: Please see below for problem oriented charting. 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 Mixed attenuation mass emanates from the right adnexa measuring 10.8 x 8.0 cm very suspicious for right ovarian carcinoma. 2. Abnormality of the tail of the pancreas may be due to mild pancreatitis and small pseudocyst formation, but neoplasm cannot be excluded. 3. Small amount of ascites within abdomen and pelvis. 4. Small nonobstructing renal calculi bilaterally.    08/30/2017 Pathology Results   1. Uterus and cervix, with left fallopian tube - ENDOMETRIOID ADENOCARCINOMA, FIGO GRADE I, ARISING IN A BACKGROUND OF DIFFUSE COMPLEX ATYPICAL HYPERPLASIA. - CARCINOMA INVADES FOR OF DEPTH OF 0.2 CM WHERE THICKNESS OF MYOMETRIAL WALL IS 2.1 CM. - ALL RESECTION MARGINS ARE NEGATIVE FOR CARCINOMA. - NEGATIVE FOR LYMPHOVASCULAR OR PERINEURAL INVASION. - CERVICAL STROMA IS NOT INVOLVED. - SEE ONCOLOGY TABLE. - SEE NOTE 2. Ovary and fallopian tube, right - PRIMARY OVARIAN ENDOMETRIOID ADENOCARCINOMA, FIGO GRADE II, 12 CM. - THE OVARIAN SURFACE IS FOCALLY INVOLVED BY CARCINOMA. - NEGATIVE FOR LYMPHOVASCULAR INVASION. - BENIGN UNREMARKABLE FALLOPIAN TUBE, NEGATIVE FOR CARCINOMA. - SEE ONCOLOGY TABLE. - SEE NOTE 3. Cul-de-sac biopsy - METASTATIC ADENOCARCINOMA, MOST CONSISTENT WITH PRIMARY OVARIAN ENDOMETRIOID ADENOCARCINOMA. 4. Ovary, left - BENIGN UNREMARKABLE OVARY, NEGATIVE FOR MALIGNANCY. Microscopic Comment 1. UTERUS, CARCINOMA OR CARCINOSARCOMA Procedure: Total hysterectomy with bilateral salpingo-oophorectomy. Histologic type: Endometrioid adenocarcinoma. Histologic Grade: FIGO Grade I Myometrial invasion: Depth of invasion: 2 mm Myometrial thickness: 21 mm Uterine Serosa Involvement: Not identified Cervical stromal involvement: Not identified Extent of involvement of other organs: Not applicable Lymphovascular invasion: Not identified Regional Lymph Nodes: Examined: 0 Sentinel 0 Non-sentinel 0 Total Tumor block for ancillary studies: 1H MMR / MSI testing:  Pending Pathologic Stage Classification (pTNM, AJCC 8th edition): pT1a, pNX (v4.1.0.0) 2. OVARY or FALLOPIAN TUBE or PRIMARY PERITONEUM: Procedure: Salpingo-oophorectomy Specimen Integrity: Intact Tumor Site: Right ovary Ovarian Surface Involvement (required only if applicable): Focally involved by carcinoma Fallopian Tube Surface Involvement (required only if applicable): Not identified Tumor Size: 12 cm Histologic Type: Endometrioid adenocarcinoma Histologic Grade: Grade II Implants (required for advanced stage serous/seromucinous borderline tumors only): Not applicable Other Tissue/ Organ Involvement: Cul de sac biopsy involved by tumor Largest Extrapelvic Peritoneal Focus (required only if applicable): Not applicable Peritoneal/Ascitic Fluid: Negative for carcinoma (case # [id number]) Treatment Effect (required only for high-grade serous carcinomas): Not applicable Regional Lymph Nodes: No lymph nodes submitted or found Number of Lymph Nodes Examined: 0 Pathologic Stage Classification (pTNM, AJCC 8th Edition): pT2b, pN0 Representative Tumor Block: 2B and 2E 1. Molecular study for microsatellite instability and immunohistochemical stains for MMR-related proteins are pending and will be reported in an addendum. 2. Immunohistochemical stain show that the ovarian tumor is positive for CK7 and PAX8 (both diffuse), CDX2 (patchy and weak); and negative for CK20. This immunoprofile is consistent with the above diagnosis. Dr. Lyndon Code has reviewed this case and concurs with the above diagnosis. Molecular study for microsatellite instability and immunohistochemical stains for MMR-related proteins are pending and will be reported in an addendum   08/30/2017 Genetic Testing   Patient has genetic testing done for MSI  Results revealed patient has the following mutation(s): loss of [facility] 2   08/30/2017 Surgery  Surgeon: Shawna L Bull Phelps, MD Pre-operative Diagnosis:  1. Adnexal mass 2. Abnormal  uterine bleeding 3. H/o Cecal CA  Post-operative Diagnosis:  1. Adhesive disease post colon resection 2. Endometrial cancer NOS 3. Adenocarcinoma unknown origin, right ovary, suspicious for GI primary  Operation:  1. Lysis of adhesions ~30 minutes 2. Robotic-assisted laparoscopic total hysterectomy with right salpingo-oophorectomy and left salpingectomy 3. Left oophorectomy (RA-laparoscopic) 4. Pelvic washings  Findings: Adhesions of omentum to anterior abdominal wall. Enlarged cystic right ovary ~10cm. Uterus had small nodules on serosa near where right adnexa was intimate with the surface. No obvious intraoperative rupture of cyst, although in 2 areas the wall was thin and one of these areas had some bleeding. Slight scarring of left bladder dome to LUS/cervix. Uterus on frozen section c/w hyperplasia and a small focus of endometrial CA - no myo invasion, <2cm in size. Frozen section on the right adnexa was carcinoma, met from colon or possibly Gyn, favor GI primary, defer to permanent. Left ovary was WNL.     09/15/2017 Cancer Staging   Staging form: Corpus Uteri - Carcinoma and Carcinosarcoma, AJCC 8th Edition - Pathologic: FIGO Stage IA (pT1a, pN0, cM0) - Signed by , , MD on 09/15/2017   09/26/2017 Procedure   Successful placement of a right internal jugular approach power injectable Port-A-Cath. The catheter is ready for immediate use.   11/07/2017 Imaging   1. Since 08/18/2017, similar to slight decrease in size of a pancreatic body/tail junction lesion. Cross modality comparison relative to 09/16/2017 MRI is also grossly similar. 2. No evidence of metastatic disease. 3. Aortic Atherosclerosis (ICD10-I70.0).  4. Left nephrolithiasis.   11/18/2017 Genetic Testing   MSH2 c.2237dupT pathogenic mutation identified in the CancerNext panel.  The CancerNext gene panel offered by [employer] includes sequencing and rearrangement analysis for the following 34 genes:   APC,  ATM, BARD1, BMPR1A, BRCA1, BRCA2, BRIP1, CDH1, CDK4, CDKN2A, CHEK2, DICER1, HOXB13, EPCAM, GREM1, MLH1, MRE11A, MSH2, MSH6, MUTYH, NBN, NF1, PALB2, PMS2, POLD1, POLE, PTEN, RAD50, RAD51C, RAD51D, SMAD4, SMARCA4, STK11, and TP53.  The report date is November 18, 2017.  MSH2 [id number] pathogenic mutation identified on somatic testing.  These results are consistent with a diagnosis of Lynch syndrome.   06/18/2019 -  Chemotherapy   The patient had palonosetron (ALOXI) injection 0.25 mg, 0.25 mg, Intravenous,  Once, 0 of 8 cycles pegfilgrastim-cbqv (UDENYCA) injection 6 mg, 6 mg, Subcutaneous, Once, 0 of 8 cycles irinotecan (CAMPTOSAR) 200 mg in sodium chloride 0.9 % 500 mL chemo infusion, 100 mg/m2 = 200 mg (66.67 % of original dose 150 mg/m2), Intravenous,  Once, 0 of 8 cycles Dose modification: 100 mg/m2 (66.7 % of original dose 150 mg/m2, Cycle 1, Reason: Dose Not Tolerated) oxaliplatin (ELOXATIN) 80 mg in dextrose 5 % 500 mL chemo infusion, 42.5 mg/m2 = 80 mg (50 % of original dose 85 mg/m2), Intravenous,  Once, 0 of 8 cycles Dose modification: 42.5 mg/m2 (50 % of original dose 85 mg/m2, Cycle 1, Reason: Dose Not Tolerated) fosaprepitant (EMEND) 150 mg in sodium chloride 0.9 % 145 mL IVPB, 150 mg, Intravenous,  Once, 0 of 8 cycles fluorouracil (ADRUCIL) 3,650 mg in sodium chloride 0.9 % 77 mL chemo infusion, 1,920 mg/m2 = 3,650 mg (80 % of original dose 2,400 mg/m2), Intravenous, 1 Day/Dose, 0 of 8 cycles Dose modification: 1,920 mg/m2 (80 % of original dose 2,400 mg/m2, Cycle 1, Reason: Dose Not Tolerated) leucovorin 764 mg in sodium chloride 0.9 % 250 mL infusion, 400 mg/m2 =   764 mg, Intravenous,  Once, 0 of 8 cycles  for chemotherapy treatment.    Secondary malignant neoplasm of right ovary ([place])  08/23/2017 Tumor Marker   Patient's tumor was tested for the following markers: CA-125 Results of the tumor marker test revealed 139.8   08/31/2017 Initial Diagnosis   Secondary  malignant neoplasm of right ovary ([place])   09/15/2017 Cancer Staging   Staging form: Ovary, Fallopian Tube, and Primary Peritoneal Carcinoma, AJCC 8th Edition - Pathologic: Stage IIB (pT2b, pN0, cM0) - Signed by Heath Lark, MD on 09/15/2017   09/27/2017 Imaging   No evidence of metastatic disease or other acute findings within the thorax.  4 cm low-attenuation mass in pancreatic tail, highly suspicious for pancreatic carcinoma. This is caused splenic vein thrombosis, with new venous collaterals in the left upper quadrant. Consider endoscopic ultrasound with FNA for tissue diagnosis.  Stable benign hepatic hemangioma.    09/27/2017 Tumor Marker   Patient's tumor was tested for the following markers: CA-125 Results of the tumor marker test revealed 39.4   11/02/2017 Tumor Marker   Patient's tumor was tested for the following markers: CA-125 Results of the tumor marker test revealed 19   11/07/2017 Tumor Marker   Patient's tumor was tested for the following markers: CA-125 Results of the tumor marker test revealed 18.3   11/18/2017 Genetic Testing   MSH2 c.2237dupT pathogenic mutation identified in the CancerNext panel.  The CancerNext gene panel offered by [employer] includes sequencing and rearrangement analysis for the following 34 genes:   APC, ATM, BARD1, BMPR1A, BRCA1, BRCA2, BRIP1, CDH1, CDK4, CDKN2A, CHEK2, DICER1, HOXB13, EPCAM, GREM1, MLH1, MRE11A, MSH2, MSH6, MUTYH, NBN, NF1, PALB2, PMS2, POLD1, POLE, PTEN, RAD50, RAD51C, RAD51D, SMAD4, SMARCA4, STK11, and TP53.  The report date is November 18, 2017.  MSH2 [id number] pathogenic mutation identified on somatic testing.  These results are consistent with a diagnosis of Lynch syndrome.   12/15/2017 Tumor Marker   Patient's tumor was tested for the following markers: CA-125 Results of the tumor marker test revealed 57.3   01/16/2018 Tumor Marker   Patient's tumor was tested for the following markers: CA-125 Results  of the tumor marker test revealed 24.2   04/10/2018 Tumor Marker   Patient's tumor was tested for the following markers: CA-125 Results of the tumor marker test revealed 18.2   07/12/2018 Tumor Marker   Patient's tumor was tested for the following markers: CA-125 Results of the tumor marker test revealed 11.8   10/16/2018 Tumor Marker   Patient's tumor was tested for the following markers: CA125 Results of the tumor marker test revealed 12.4.   Pancreatic cancer ([place])  10/13/2017 Pathology Results   Pancreas tail mass, endoscopic ultrasound-guided, fine needle aspiration II (smears and cell block): Adenocarcinoma   11/18/2017 Genetic Testing   MSH2 c.2237dupT pathogenic mutation identified in the CancerNext panel.  The CancerNext gene panel offered by [employer] includes sequencing and rearrangement analysis for the following 34 genes:   APC, ATM, BARD1, BMPR1A, BRCA1, BRCA2, BRIP1, CDH1, CDK4, CDKN2A, CHEK2, DICER1, HOXB13, EPCAM, GREM1, MLH1, MRE11A, MSH2, MSH6, MUTYH, NBN, NF1, PALB2, PMS2, POLD1, POLE, PTEN, RAD50, RAD51C, RAD51D, SMAD4, SMARCA4, STK11, and TP53.  The report date is November 18, 2017.  MSH2 [id number] pathogenic mutation identified on somatic testing.  These results are consistent with a diagnosis of Lynch syndrome.   11/24/2017 Pathology Results   A. "TAIL OF PANCREAS AND SPLEEN", DISTAL PANCREATECTOMY AND SPLENECTOMY: Invasive ductal adenocarcinoma, moderately to poorly differentiated with focal signet  ring cell features, of pancreas (distal).   The carcinoma is 2.5 cm in greatest dimension grossly.   Treatment effects present in the form of fibrosis (50%). No lymphovascular or definite perineural invasion identified. All surgical margins are negative for tumor or high-grade dysplasia. Adjacent mucinous neoplasm, most consistent with Intraductal papillary mucinous neoplasm (IPMN) with low-grade dysplasia.    Uninvolved pancreas show atrophy and focal acute inflammation.   Twelve benign lymph nodes (0/12). Spleen with no significant histopathologic abnormalities.  PROCEDURE: distal pancreatectomy and splenectomy TUMOR SITE: distal pancreas TUMOR SIZE:  GREATEST DIMENSION: 2.5 cm  ADDITIONAL DIMENSIONS:  x  cm HISTOLOGIC TYPE: ductal adenocarcinoma HISTOLOGIC GRADE: grade 3 TUMOR EXTENSION: peripancreatic soft tissue MARGINS: negative for tumor TREATMENT EFFECT: treatment effects present in the form of fibrosis (50%). LYMPHOVASCULAR INVASION: not identified PERINEURAL INVASION: no definite evidence REGIONAL LYMPH NODES:   NUMBER OF LYMPH NODES INVOLVED: 0   NUMBER OF LYMPH NODES EXAMINED: 12 PATHOLOGIC STAGE CLASSIFICATION (pTNM, AJCC 8th Ed): ypT2, ypN0 DISTANT METASTASIS (pM): pMx ADDITIONAL PATHOLOGIC FINDINGS: mucinous neoplasm, most consistent with intraductal papillary mucinous neoplasm (IPMN) with low-grade dysplasia, is identified adjacent to the main tumor.   11/24/2017 Cancer Staging   Staging form: Exocrine Pancreas, AJCC 8th Edition - Pathologic stage from 11/24/2017: Stage IB (pT2, pN0, cM0) - Signed by Truitt Merle, MD on 01/03/2018   11/25/2017 Surgery   She had surgery at [facility] 1. Exploratory Laparotomy 2. Distal Pancreatectomy and Splenectomy 3. Intraoperative Ultrasound 4. Open Cholecystectomy    12/15/2017 Cancer Staging   Staging form: Exocrine Pancreas, AJCC 8th Edition - Clinical: Stage IB (cT2, cN0, cM0) - Signed by Heath Lark, MD on 12/15/2017   12/28/2017 Imaging   12/28/2017 CT Abdomen  IMPRESSION: 1. Postoperative findings from recent partial pancreatectomy including a 21 cubic cm fluid collection along the pancreatic resection margin which could represent early pseudocyst. 2. Nodularity along the lateral limb of the left adrenal gland could also be postoperative but merit surveillance, as the pancreatic lesion was in close  proximity to this adrenal gland on the prior CT of 11/07/2017. 3. Asymmetric fullness inferiorly in the left breast. The patient has a history of prior left breast procedures, correlation with mammography is recommended. 4. Other imaging findings of potential clinical significance: Stable hemangioma in the left hepatic lobe. Splenectomy. Aortic Atherosclerosis (ICD10-I70.0). Right hemicolectomy. Small focus of fat necrosis in the right anterior abdominal wall subcutaneous tissues near the laparotomy site. Bilateral nonobstructive nephrolithiasis.   01/02/2018 Tumor Marker   Patient's tumor was tested for the following markers: CA-19-9 Results of the tumor marker test revealed 5   01/03/2018 - 06/05/2018 Chemotherapy   She received modified dose FOLFIRINOX   04/10/2018 Imaging   1. Distal pancreatectomy, without findings of recurrent or metastatic disease. 2. Incompletely imaged hypoenhancement within lower lobe right pulmonary artery branch is likely chronic (but interval since 10/09/2017) pulmonary embolism. Dedicated CTA could further evaluate. 3. Decrease in size of a peripancreatic complex fluid collection anteriorly, likely a resolving pseudocyst. 4. Decreased size of minimal fluid versus a borderline sized node in the gastrohepatic ligament. Recommend attention on follow-up. 5. Hepatic steatosis with a segment 4 hemangioma. 6. Aortic Atherosclerosis (ICD10-I70.0). This is significantly age advanced.   07/12/2018 Tumor Marker   Patient's tumor was tested for the following markers: CA-19-9 Results of the tumor marker test revealed 6   07/12/2018 Imaging   1. Status post distal pancreatectomy with stable postoperative fluid collection adjacent to the ventral aspect of the pancreatic  head. No findings to suggest metastatic disease in the abdomen or pelvis. 2. Hepatic steatosis with small cavernous hemangioma in segment 4A of the liver. 3. Slight decreased size of nonenlarged gastrohepatic  ligament lymph node, presumably benign. 4. Aortic atherosclerosis.    10/16/2018 Imaging   Status post distal pancreatectomy. Stable postoperative seroma along the surgical margin.   Status post hysterectomy and right oophorectomy.   Status post right hemicolectomy with appendectomy.   No evidence of recurrent or metastatic disease.   No colonic wall thickening or mass is evident on CT.   01/22/2019 Tumor Marker   Patient's tumor was tested for the following markers: CA-19.9 Results of the tumor marker test revealed 5   01/22/2019 Imaging   1. No evidence of local recurrence or metastatic disease status post distal pancreatectomy and splenectomy. 2. Stable small seroma anterior to the pancreatic head. 3. Postsurgical changes as described. 4. Stable additional incidental findings including a hepatic hemangioma, nonobstructing bilateral renal calculi and aortic Atherosclerosis (ICD10-I70.0).   04/18/2019 Imaging   1. Status post distal pancreatectomy and splenectomy. There has been a gradual increase in ill-defined soft tissue and celiac axis/SMA origin lymph nodes adjacent to the distal pancreatectomy margin and lesser curvature of the stomach/gastric body over sequential prior examinations dated 01/22/2019 and 09/26/2018. An enlarged lymph node or soft tissue nodule adjacent to the SMA origin now measures 1.8 x 0.8 cm. Findings are concerning for local recurrence of pancreatic malignancy. 2. Separately from the above findings, there remains a 1.0 cm low-attenuation nodule anterior to the remnant pancreatic neck, most consistent with postoperative seroma 3. No evidence of distant metastatic disease in the chest, abdomen, or pelvis. 4. Postoperative findings of cholecystectomy, right hemicolectomy, and hysterectomy. 5. Bilateral nonobstructive nephrolithiasis. 6.  Aortic Atherosclerosis (ICD10-I70.0).       05/28/2019 Tumor Marker   Patient's tumor was tested for the following  markers: CA19-9 Results of the tumor marker test revealed 18   05/28/2019 Imaging   1. Status post distal pancreatectomy with continued further progression of the ill-defined soft tissue to the left of the celiac axis/SMA origin, now measuring 1.9 x 1.9 cm and highly concerning for recurrent/metastatic disease. This process abuts both the celiac axis, SMA, and posterior wall of the mid stomach. PET-CT may prove helpful to further evaluate. 2. Bandlike ill-defined soft tissue in the anterior abdomen, potentially peritoneal or omental is similar to perhaps minimally increased qualitatively in the interval. Close attention on follow-up recommended. 3. Left paraumbilical hernia contains a short segment of small bowel without complicating features. 4. Ill-defined very subtle area of decreased attenuation in the posterior aspect of hepatic segment IV. This is likely focal fatty deposition. Close attention on follow-up recommended. 5.  Aortic Atherosclerois (ICD10-170.0)   06/07/2019 Pathology Results   Malignant cells consistent with adenocarcinoma   06/07/2019 Procedure   ENDOSCOPIC FINDING (limited views with linear echoendoscope): :      The examined esophagus was endoscopically normal.      The entire examined stomach was endoscopically normal.      ENDOSONOGRAPHIC FINDING: :      1. An irregular mass was identified in the region of the pancreatic tail resection site. The mass was stellate with very poorly defined borders. Fine needle aspiration for cytology was performed. Color Doppler imaging was utilized prior to needle puncture to confirm a lack of significant vascular structures within the needle path. Four passes were made with the 25 gauge (FNB) needle using a transgastric approach. A cytotechnologist  was present to evaluate the adequacy of the specimen. Final cytology results are pending. Impression:               -  Irregular, stellate soft tissue mass in the region of the pancreatic tail  resection site. This was sampled with four transgastric passes with an EUS FNB needle..   06/18/2019 -  Chemotherapy   The patient had palonosetron (ALOXI) injection 0.25 mg, 0.25 mg, Intravenous,  Once, 0 of 8 cycles pegfilgrastim-cbqv (UDENYCA) injection 6 mg, 6 mg, Subcutaneous, Once, 0 of 8 cycles irinotecan (CAMPTOSAR) 200 mg in sodium chloride 0.9 % 500 mL chemo infusion, 100 mg/m2 = 200 mg (66.67 % of original dose 150 mg/m2), Intravenous,  Once, 0 of 8 cycles Dose modification: 100 mg/m2 (66.7 % of original dose 150 mg/m2, Cycle 1, Reason: Dose Not Tolerated) oxaliplatin (ELOXATIN) 80 mg in dextrose 5 % 500 mL chemo infusion, 42.5 mg/m2 = 80 mg (50 % of original dose 85 mg/m2), Intravenous,  Once, 0 of 8 cycles Dose modification: 42.5 mg/m2 (50 % of original dose 85 mg/m2, Cycle 1, Reason: Dose Not Tolerated) fosaprepitant (EMEND) 150 mg in sodium chloride 0.9 % 145 mL IVPB, 150 mg, Intravenous,  Once, 0 of 8 cycles fluorouracil (ADRUCIL) 3,650 mg in sodium chloride 0.9 % 77 mL chemo infusion, 1,920 mg/m2 = 3,650 mg (80 % of original dose 2,400 mg/m2), Intravenous, 1 Day/Dose, 0 of 8 cycles Dose modification: 1,920 mg/m2 (80 % of original dose 2,400 mg/m2, Cycle 1, Reason: Dose Not Tolerated) leucovorin 764 mg in sodium chloride 0.9 % 250 mL infusion, 400 mg/m2 = 764 mg, Intravenous,  Once, 0 of 8 cycles  for chemotherapy treatment.      REVIEW OF SYSTEMS:   Constitutional: Denies fevers, chills or abnormal weight loss Eyes: Denies blurriness of vision Ears, nose, mouth, throat, and face: Denies mucositis or sore throat Respiratory: Denies cough, dyspnea or wheezes Cardiovascular: Denies palpitation, chest discomfort or lower extremity swelling Gastrointestinal:  Denies nausea, heartburn or change in bowel habits Skin: Denies abnormal skin rashes Lymphatics: Denies new lymphadenopathy or easy bruising Neurological:Denies numbness, tingling or new weaknesses Behavioral/Psych: Mood is  stable, no new changes  All other systems were reviewed with the patient and are negative.  I have reviewed the past medical history, past surgical history, social history and family history with the patient and they are unchanged from previous note.  ALLERGIES:  is allergic to codeine; penicillins; and bactrim [sulfamethoxazole-trimethoprim].  MEDICATIONS:  Current Outpatient Medications  Medication Sig Dispense Refill  . ondansetron (ZOFRAN) 8 MG tablet Take 8 mg by mouth every 8 (eight) hours as needed.    . prochlorperazine (COMPAZINE) 10 MG tablet Take 10 mg by mouth every 6 (six) hours as needed.    . cetirizine (ZYRTEC) 10 MG tablet Take 10 mg by mouth daily.    . citalopram (CELEXA) 20 MG tablet Take 1 tablet (20 mg total) by mouth daily. 30 tablet 11  . CREON 24000-76000 units CPEP Take 1 capsule by mouth See admin instructions. Take 1 capsule before each meal and 1 capsule after each meal, take 1 cap with snacks    . diclofenac Sodium (VOLTAREN) 1 % GEL Apply 2-4 grams to affected joint 4 times daily as needed. 400 g 1  . estradiol (ESTRACE VAGINAL) 0.1 MG/GM vaginal cream Place 1 Applicatorful vaginally 3 (three) times a week. 42.5 g 12  . fluticasone (FLONASE) 50 MCG/ACT nasal spray Place 1 spray into both nostrils daily.    .   folic acid (FOLVITE) 1 MG tablet Take 2 tablets (2 mg total) by mouth daily. 180 tablet 3  . gabapentin (NEURONTIN) 300 MG capsule Take 1 capsule (300 mg total) by mouth 3 (three) times daily. 90 capsule 3  . HYDROmorphone (DILAUDID) 4 MG tablet Take 1 tablet (4 mg total) by mouth every 6 (six) hours as needed for severe pain. 60 tablet 0  . hydrOXYzine (ATARAX/VISTARIL) 10 MG tablet Take 1 tablet (10 mg total) by mouth 3 (three) times daily as needed. 30 tablet 1  . lidocaine-prilocaine (EMLA) cream Apply 1 application topically daily as needed (port). Apply to affected area once 30 g 11  . metFORMIN (GLUCOPHAGE) 500 MG tablet TAKE 1 TABLET (500 MG TOTAL) BY  MOUTH 2 (TWO) TIMES DAILY WITH A MEAL. (Patient taking differently: Take 500 mg by mouth 2 (two) times daily with a meal. ) 60 tablet 1  . methadone (DOLOPHINE) 10 MG tablet Take 2 tablets (20 mg total) by mouth every 12 (twelve) hours. 60 tablet 0  . methocarbamol (ROBAXIN) 500 MG tablet TAKE 1 TABLET BY MOUTH 3 TIMES DAILY AS NEEDED FOR MUSCLE SPASMS. (Patient taking differently: Take 500 mg by mouth every 8 (eight) hours as needed for muscle spasms. ) 90 tablet 0  . Multiple Vitamin (MULTIVITAMIN WITH MINERALS) TABS tablet Take 1 tablet by mouth daily.    . omeprazole (PRILOSEC) 40 MG capsule Take 1 capsule (40 mg total) by mouth daily. 30 capsule 11  . predniSONE (DELTASONE) 1 MG tablet Take 4 tablets (4 mg total) by mouth daily with breakfast. 240 tablet 0  . simethicone (MYLICON) 125 MG chewable tablet Chew 125 mg by mouth every 6 (six) hours as needed for flatulence.    . traMADol (ULTRAM) 50 MG tablet Take 1 tablet (50 mg total) by mouth every 6 (six) hours as needed for moderate pain. 60 tablet 0   No current facility-administered medications for this visit.    PHYSICAL EXAMINATION: ECOG PERFORMANCE STATUS: 1 - Symptomatic but completely ambulatory  Vitals:   06/11/19 0845  BP: 122/66  Pulse: 86  Resp: 18  Temp: 98 F (36.7 C)  SpO2: 100%   Filed Weights   06/11/19 0845  Weight: 183 lb 6.4 oz (83.2 kg)    GENERAL:alert, no distress and comfortable NEURO: alert & oriented x 3 with fluent speech, no focal motor/sensory deficits  LABORATORY DATA:  I have reviewed the data as listed    Component Value Date/Time   NA 140 05/28/2019 1212   K 4.2 05/28/2019 1212   CL 100 05/28/2019 1212   CO2 26 05/28/2019 1212   GLUCOSE 104 (H) 05/28/2019 1212   BUN 10 05/28/2019 1212   CREATININE 0.64 05/28/2019 1212   CREATININE 0.71 05/23/2018 0905   CREATININE 0.68 07/27/2017 0851   CALCIUM 9.3 05/28/2019 1212   PROT 7.2 05/28/2019 1212   ALBUMIN 3.2 (L) 05/28/2019 1212   AST 19  05/28/2019 1212   AST 24 05/23/2018 0905   ALT 21 05/28/2019 1212   ALT 49 (H) 05/23/2018 0905   ALKPHOS 88 05/28/2019 1212   BILITOT <0.2 (L) 05/28/2019 1212   BILITOT 0.2 (L) 05/23/2018 0905   GFRNONAA >60 05/28/2019 1212   GFRNONAA >60 05/23/2018 0905   GFRNONAA 106 07/27/2017 0851   GFRAA >60 05/28/2019 1212   GFRAA >60 05/23/2018 0905   GFRAA 123 07/27/2017 0851    No results found for: SPEP, UPEP  Lab Results  Component Value Date   WBC   11.6 (H) 05/28/2019   NEUTROABS 7.4 05/28/2019   HGB 11.5 (L) 05/28/2019   HCT 36.6 05/28/2019   MCV 90.4 05/28/2019   PLT 658 (H) 05/28/2019      Chemistry      Component Value Date/Time   NA 140 05/28/2019 1212   K 4.2 05/28/2019 1212   CL 100 05/28/2019 1212   CO2 26 05/28/2019 1212   BUN 10 05/28/2019 1212   CREATININE 0.64 05/28/2019 1212   CREATININE 0.71 05/23/2018 0905   CREATININE 0.68 07/27/2017 0851      Component Value Date/Time   CALCIUM 9.3 05/28/2019 1212   ALKPHOS 88 05/28/2019 1212   AST 19 05/28/2019 1212   AST 24 05/23/2018 0905   ALT 21 05/28/2019 1212   ALT 49 (H) 05/23/2018 0905   BILITOT <0.2 (L) 05/28/2019 1212   BILITOT 0.2 (L) 05/23/2018 6546

## 2019-06-12 ENCOUNTER — Encounter: Payer: Self-pay | Admitting: Rheumatology

## 2019-06-12 ENCOUNTER — Other Ambulatory Visit: Payer: Self-pay | Admitting: Pharmacist

## 2019-06-12 ENCOUNTER — Other Ambulatory Visit: Payer: Self-pay | Admitting: Hematology and Oncology

## 2019-06-13 ENCOUNTER — Telehealth: Payer: Self-pay | Admitting: Hematology and Oncology

## 2019-06-13 ENCOUNTER — Telehealth: Payer: Self-pay | Admitting: Rheumatology

## 2019-06-13 MED ORDER — PREDNISONE 1 MG PO TABS: 4.0000 mg | ORAL_TABLET | Freq: Every day | ORAL | 1 refills | Status: DC

## 2019-06-13 NOTE — Telephone Encounter (Signed)
Scheduled appts per 4/19 los. Pt confirmed appt dates and times.

## 2019-06-13 NOTE — Telephone Encounter (Signed)
Prednisone prescription sent to the pharmacy.

## 2019-06-13 NOTE — Telephone Encounter (Signed)
I had detailed discussion with the patient.  She states she will not be a surgical candidate due to scar tissue.  She will be starting chemotherapy.  She is already stopped Orencia and Otrexup.  She may require radiation therapy after the chemotherapy.  She states she has been taking prednisone anywhere from 2 to 4 mg a day.  The higher dose increases her blood sugar level.  She would like to stay on prednisone during the chemotherapy.  Please call in prednisone 1 mg tablets, 4 tablets p.o. daily 90-day supply with 1 refill.  I have advised patient to keep in touch about her progress.

## 2019-06-13 NOTE — Telephone Encounter (Signed)
Patient left a voicemail last night at 5:22 pm stating she was returning Dr. Arlean Hopping call.  Patient states "the volume was cut off on her phone and missed the call."

## 2019-06-14 ENCOUNTER — Other Ambulatory Visit: Payer: Self-pay | Admitting: Hematology and Oncology

## 2019-06-15 ENCOUNTER — Other Ambulatory Visit: Payer: BC Managed Care – PPO

## 2019-06-18 ENCOUNTER — Encounter: Payer: Self-pay | Admitting: Hematology and Oncology

## 2019-06-18 ENCOUNTER — Inpatient Hospital Stay: Payer: BC Managed Care – PPO

## 2019-06-18 ENCOUNTER — Other Ambulatory Visit: Payer: Self-pay

## 2019-06-18 ENCOUNTER — Other Ambulatory Visit: Payer: Self-pay | Admitting: Hematology and Oncology

## 2019-06-18 VITALS — BP 115/74 | HR 93 | Temp 98.4°F | Resp 18

## 2019-06-18 DIAGNOSIS — Z7189 Other specified counseling: Secondary | ICD-10-CM

## 2019-06-18 DIAGNOSIS — C541 Malignant neoplasm of endometrium: Secondary | ICD-10-CM | POA: Diagnosis not present

## 2019-06-18 DIAGNOSIS — M069 Rheumatoid arthritis, unspecified: Secondary | ICD-10-CM | POA: Diagnosis not present

## 2019-06-18 DIAGNOSIS — Z5111 Encounter for antineoplastic chemotherapy: Secondary | ICD-10-CM | POA: Diagnosis not present

## 2019-06-18 DIAGNOSIS — C252 Malignant neoplasm of tail of pancreas: Secondary | ICD-10-CM | POA: Diagnosis not present

## 2019-06-18 DIAGNOSIS — G620 Drug-induced polyneuropathy: Secondary | ICD-10-CM | POA: Diagnosis not present

## 2019-06-18 DIAGNOSIS — R634 Abnormal weight loss: Secondary | ICD-10-CM | POA: Diagnosis not present

## 2019-06-18 DIAGNOSIS — C7961 Secondary malignant neoplasm of right ovary: Secondary | ICD-10-CM | POA: Diagnosis not present

## 2019-06-18 DIAGNOSIS — Z79899 Other long term (current) drug therapy: Secondary | ICD-10-CM | POA: Diagnosis not present

## 2019-06-18 MED ORDER — ATROPINE SULFATE 1 MG/ML IJ SOLN
0.4000 mg | Freq: Once | INTRAMUSCULAR | Status: AC
  Administered 2019-06-18: 0.4 mg via INTRAVENOUS

## 2019-06-18 MED ORDER — FAMOTIDINE IN NACL 20-0.9 MG/50ML-% IV SOLN
INTRAVENOUS | Status: AC
  Filled 2019-06-18: qty 50

## 2019-06-18 MED ORDER — DEXTROSE 5 % IV SOLN
Freq: Once | INTRAVENOUS | Status: AC
  Filled 2019-06-18: qty 250

## 2019-06-18 MED ORDER — SODIUM CHLORIDE 0.9 % IV SOLN
1920.0000 mg/m2 | INTRAVENOUS | Status: DC
  Administered 2019-06-18: 3650 mg via INTRAVENOUS
  Filled 2019-06-18: qty 73

## 2019-06-18 MED ORDER — SODIUM CHLORIDE 0.9 % IV SOLN
10.0000 mg | Freq: Once | INTRAVENOUS | Status: AC
  Administered 2019-06-18: 10 mg via INTRAVENOUS
  Filled 2019-06-18: qty 10

## 2019-06-18 MED ORDER — PALONOSETRON HCL INJECTION 0.25 MG/5ML
0.2500 mg | Freq: Once | INTRAVENOUS | Status: AC
  Administered 2019-06-18: 0.25 mg via INTRAVENOUS

## 2019-06-18 MED ORDER — DIPHENHYDRAMINE HCL 50 MG/ML IJ SOLN
INTRAMUSCULAR | Status: AC
  Filled 2019-06-18: qty 1

## 2019-06-18 MED ORDER — OXALIPLATIN CHEMO INJECTION 100 MG/20ML
42.5000 mg/m2 | Freq: Once | INTRAVENOUS | Status: AC
  Administered 2019-06-18: 11:00:00 80 mg via INTRAVENOUS
  Filled 2019-06-18: qty 16

## 2019-06-18 MED ORDER — FAMOTIDINE IN NACL 20-0.9 MG/50ML-% IV SOLN
20.0000 mg | Freq: Once | INTRAVENOUS | Status: AC
  Administered 2019-06-18: 20 mg via INTRAVENOUS

## 2019-06-18 MED ORDER — ATROPINE SULFATE 0.4 MG/ML IJ SOLN
INTRAMUSCULAR | Status: AC
  Filled 2019-06-18: qty 1

## 2019-06-18 MED ORDER — SODIUM CHLORIDE 0.9 % IV SOLN
150.0000 mg | Freq: Once | INTRAVENOUS | Status: AC
  Administered 2019-06-18: 150 mg via INTRAVENOUS
  Filled 2019-06-18: qty 150

## 2019-06-18 MED ORDER — ATROPINE SULFATE 1 MG/ML IJ SOLN
INTRAMUSCULAR | Status: AC
  Filled 2019-06-18: qty 1

## 2019-06-18 MED ORDER — DIPHENHYDRAMINE HCL 50 MG/ML IJ SOLN
50.0000 mg | Freq: Once | INTRAMUSCULAR | Status: AC
  Administered 2019-06-18: 50 mg via INTRAVENOUS

## 2019-06-18 MED ORDER — DIPHENOXYLATE-ATROPINE 2.5-0.025 MG PO TABS
1.0000 | ORAL_TABLET | Freq: Four times a day (QID) | ORAL | 0 refills | Status: DC | PRN
Start: 2019-06-18 — End: 2019-08-13

## 2019-06-18 MED ORDER — SODIUM CHLORIDE 0.9 % IV SOLN
100.0000 mg/m2 | Freq: Once | INTRAVENOUS | Status: AC
  Administered 2019-06-18: 13:00:00 200 mg via INTRAVENOUS
  Filled 2019-06-18: qty 10

## 2019-06-18 MED ORDER — HEPARIN SOD (PORK) LOCK FLUSH 100 UNIT/ML IV SOLN
500.0000 [IU] | Freq: Once | INTRAVENOUS | Status: DC | PRN
  Filled 2019-06-18: qty 5

## 2019-06-18 MED ORDER — ATROPINE SULFATE 1 MG/ML IJ SOLN: 0.5000 mg | Freq: Once | INTRAMUSCULAR | Status: DC | PRN

## 2019-06-18 MED ORDER — SODIUM CHLORIDE 0.9 % IV SOLN
400.0000 mg/m2 | Freq: Once | INTRAVENOUS | Status: AC
  Administered 2019-06-18: 764 mg via INTRAVENOUS
  Filled 2019-06-18: qty 38.2

## 2019-06-18 MED ORDER — SODIUM CHLORIDE 0.9% FLUSH
10.0000 mL | INTRAVENOUS | Status: DC | PRN
  Filled 2019-06-18: qty 10

## 2019-06-18 MED ORDER — PALONOSETRON HCL INJECTION 0.25 MG/5ML
INTRAVENOUS | Status: AC
  Filled 2019-06-18: qty 5

## 2019-06-18 NOTE — Progress Notes (Signed)
Per Dr. Alvy Bimler, Leslie to proceed today without labs

## 2019-06-18 NOTE — Patient Instructions (Signed)
[facility] Discharge Instructions for Patients Receiving Chemotherapy  Today you received the following chemotherapy agents Oxaliplatin; irinotecan; leucovorin; 5-fu  To help prevent nausea and vomiting after your treatment, we encourage you to take your nausea medication as directed   If you develop nausea and vomiting that is not controlled by your nausea medication, call the clinic.   BELOW ARE SYMPTOMS THAT SHOULD BE REPORTED IMMEDIATELY:  *FEVER GREATER THAN 100.5 F  *CHILLS WITH OR WITHOUT FEVER  NAUSEA AND VOMITING THAT IS NOT CONTROLLED WITH YOUR NAUSEA MEDICATION  *UNUSUAL SHORTNESS OF BREATH  *UNUSUAL BRUISING OR BLEEDING  TENDERNESS IN MOUTH AND THROAT WITH OR WITHOUT PRESENCE OF ULCERS  *URINARY PROBLEMS  *BOWEL PROBLEMS  UNUSUAL RASH Items with * indicate a potential emergency and should be followed up as soon as possible.  Feel free to call the clinic should you have any questions or concerns. The clinic phone number is [phone].  Please show the [facility] at check-in to the Emergency Department and triage nurse.   Oxaliplatin Injection What is this medicine? OXALIPLATIN (ox AL i PLA tin) is a chemotherapy drug. It targets fast dividing cells, like cancer cells, and causes these cells to die. This medicine is used to treat cancers of the colon and rectum, and many other cancers. This medicine may be used for other purposes; ask your health care provider or pharmacist if you have questions. COMMON BRAND NAME(S): Eloxatin What should I tell my health care provider before I take this medicine? They need to know if you have any of these conditions:  heart disease  history of irregular heartbeat  liver disease  low blood counts, like white cells, platelets, or red blood cells  lung or breathing disease, like asthma  take medicines that treat or prevent blood clots  tingling of the fingers or toes, or other nerve  disorder  an unusual or allergic reaction to oxaliplatin, other chemotherapy, other medicines, foods, dyes, or preservatives  pregnant or trying to get pregnant  breast-feeding How should I use this medicine? This drug is given as an infusion into a vein. It is administered in a hospital or clinic by a specially trained health care professional. Talk to your pediatrician regarding the use of this medicine in children. Special care may be needed. Overdosage: If you think you have taken too much of this medicine contact a poison control center or emergency room at once. NOTE: This medicine is only for you. Do not share this medicine with others. What if I miss a dose? It is important not to miss a dose. Call your doctor or health care professional if you are unable to keep an appointment. What may interact with this medicine? Do not take this medicine with any of the following medications:  cisapride  dronedarone  pimozide  thioridazine This medicine may also interact with the following medications:  aspirin and aspirin-like medicines  certain medicines that treat or prevent blood clots like warfarin, apixaban, dabigatran, and rivaroxaban  cisplatin  cyclosporine  diuretics  medicines for infection like acyclovir, adefovir, amphotericin B, bacitracin, cidofovir, foscarnet, ganciclovir, gentamicin, pentamidine, vancomycin  NSAIDs, medicines for pain and inflammation, like ibuprofen or naproxen  other medicines that prolong the QT interval (an abnormal heart rhythm)  pamidronate  zoledronic acid This list may not describe all possible interactions. Give your health care provider a list of all the medicines, herbs, non-prescription drugs, or dietary supplements you use. Also tell them if you smoke,  drink alcohol, or use illegal drugs. Some items may interact with your medicine. What should I watch for while using this medicine? Your condition will be monitored carefully  while you are receiving this medicine. You may need blood work done while you are taking this medicine. This medicine may make you feel generally unwell. This is not uncommon as chemotherapy can affect healthy cells as well as cancer cells. Report any side effects. Continue your course of treatment even though you feel ill unless your healthcare professional tells you to stop. This medicine can make you more sensitive to cold. Do not drink cold drinks or use ice. Cover exposed skin before coming in contact with cold temperatures or cold objects. When out in cold weather wear warm clothing and cover your mouth and nose to warm the air that goes into your lungs. Tell your doctor if you get sensitive to the cold. Do not become pregnant while taking this medicine or for 9 months after stopping it. Women should inform their health care professional if they wish to become pregnant or think they might be pregnant. Men should not father a child while taking this medicine and for 6 months after stopping it. There is potential for serious side effects to an unborn child. Talk to your health care professional for more information. Do not breast-feed a child while taking this medicine or for 3 months after stopping it. This medicine has caused ovarian failure in some women. This medicine may make it more difficult to get pregnant. Talk to your health care professional if you are concerned about your fertility. This medicine has caused decreased sperm counts in some men. This may make it more difficult to father a child. Talk to your health care professional if you are concerned about your fertility. This medicine may increase your risk of getting an infection. Call your health care professional for advice if you get a fever, chills, or sore throat, or other symptoms of a cold or flu. Do not treat yourself. Try to avoid being around people who are sick. Avoid taking medicines that contain aspirin, acetaminophen,  ibuprofen, naproxen, or ketoprofen unless instructed by your health care professional. These medicines may hide a fever. Be careful brushing or flossing your teeth or using a toothpick because you may get an infection or bleed more easily. If you have any dental work done, tell your dentist you are receiving this medicine. What side effects may I notice from receiving this medicine? Side effects that you should report to your doctor or health care professional as soon as possible:  allergic reactions like skin rash, itching or hives, swelling of the face, lips, or tongue  breathing problems  cough  low blood counts - this medicine may decrease the number of white blood cells, red blood cells, and platelets. You may be at increased risk for infections and bleeding  nausea, vomiting  pain, redness, or irritation at site where injected  pain, tingling, numbness in the hands or feet  signs and symptoms of bleeding such as bloody or black, tarry stools; red or dark brown urine; spitting up blood or brown material that looks like coffee grounds; red spots on the skin; unusual bruising or bleeding from the eyes, gums, or nose  signs and symptoms of a dangerous change in heartbeat or heart rhythm like chest pain; dizziness; fast, irregular heartbeat; palpitations; feeling faint or lightheaded; falls  signs and symptoms of infection like fever; chills; cough; sore throat; pain or trouble passing  urine  signs and symptoms of liver injury like dark yellow or brown urine; general ill feeling or flu-like symptoms; light-colored stools; loss of appetite; nausea; right upper belly pain; unusually weak or tired; yellowing of the eyes or skin  signs and symptoms of low red blood cells or anemia such as unusually weak or tired; feeling faint or lightheaded; falls  signs and symptoms of muscle injury like dark urine; trouble passing urine or change in the amount of urine; unusually weak or tired; muscle  pain; back pain Side effects that usually do not require medical attention (report to your doctor or health care professional if they continue or are bothersome):  changes in taste  diarrhea  gas  hair loss  loss of appetite  mouth sores This list may not describe all possible side effects. Call your doctor for medical advice about side effects. You may report side effects to FDA at 1-800-FDA-1088. Where should I keep my medicine? This drug is given in a hospital or clinic and will not be stored at home. NOTE: This sheet is a summary. It may not cover all possible information. If you have questions about this medicine, talk to your doctor, pharmacist, or health care provider.  2020 Elsevier/Gold Standard (2018-06-28 12:20:35)  Irinotecan injection What is this medicine? IRINOTECAN (ir in oh TEE kan ) is a chemotherapy drug. It is used to treat colon and rectal cancer. This medicine may be used for other purposes; ask your health care provider or pharmacist if you have questions. COMMON BRAND NAME(S): Camptosar What should I tell my health care provider before I take this medicine? They need to know if you have any of these conditions:  dehydration  diarrhea  infection (especially a virus infection such as chickenpox, cold sores, or herpes)  liver disease  low blood counts, like low white cell, platelet, or red cell counts  low levels of calcium, magnesium, or potassium in the blood  recent or ongoing radiation therapy  an unusual or allergic reaction to irinotecan, other medicines, foods, dyes, or preservatives  pregnant or trying to get pregnant  breast-feeding How should I use this medicine? This drug is given as an infusion into a vein. It is administered in a hospital or clinic by a specially trained health care professional. Talk to your pediatrician regarding the use of this medicine in children. Special care may be needed. Overdosage: If you think you have  taken too much of this medicine contact a poison control center or emergency room at once. NOTE: This medicine is only for you. Do not share this medicine with others. What if I miss a dose? It is important not to miss your dose. Call your doctor or health care professional if you are unable to keep an appointment. What may interact with this medicine? This medicine may interact with the following medications:  antiviral medicines for HIV or AIDS  certain antibiotics like rifampin or rifabutin  certain medicines for fungal infections like itraconazole, ketoconazole, posaconazole, and voriconazole  certain medicines for seizures like carbamazepine, phenobarbital, phenotoin  clarithromycin  gemfibrozil  nefazodone  St. John's Wort This list may not describe all possible interactions. Give your health care provider a list of all the medicines, herbs, non-prescription drugs, or dietary supplements you use. Also tell them if you smoke, drink alcohol, or use illegal drugs. Some items may interact with your medicine. What should I watch for while using this medicine? Your condition will be monitored carefully while you are  receiving this medicine. You will need important blood work done while you are taking this medicine. This drug may make you feel generally unwell. This is not uncommon, as chemotherapy can affect healthy cells as well as cancer cells. Report any side effects. Continue your course of treatment even though you feel ill unless your doctor tells you to stop. In some cases, you may be given additional medicines to help with side effects. Follow all directions for their use. You may get drowsy or dizzy. Do not drive, use machinery, or do anything that needs mental alertness until you know how this medicine affects you. Do not stand or sit up quickly, especially if you are an older patient. This reduces the risk of dizzy or fainting spells. Call your health care professional for  advice if you get a fever, chills, or sore throat, or other symptoms of a cold or flu. Do not treat yourself. This medicine decreases your body's ability to fight infections. Try to avoid being around people who are sick. Avoid taking products that contain aspirin, acetaminophen, ibuprofen, naproxen, or ketoprofen unless instructed by your doctor. These medicines may hide a fever. This medicine may increase your risk to bruise or bleed. Call your doctor or health care professional if you notice any unusual bleeding. Be careful brushing and flossing your teeth or using a toothpick because you may get an infection or bleed more easily. If you have any dental work done, tell your dentist you are receiving this medicine. Do not become pregnant while taking this medicine or for 6 months after stopping it. Women should inform their health care professional if they wish to become pregnant or think they might be pregnant. Men should not father a child while taking this medicine and for 3 months after stopping it. There is potential for serious side effects to an unborn child. Talk to your health care professional for more information. Do not breast-feed an infant while taking this medicine or for 7 days after stopping it. This medicine has caused ovarian failure in some women. This medicine may make it more difficult to get pregnant. Talk to your health care professional if you are concerned about your fertility. This medicine has caused decreased sperm counts in some men. This may make it more difficult to father a child. Talk to your health care professional if you are concerned about your fertility. What side effects may I notice from receiving this medicine? Side effects that you should report to your doctor or health care professional as soon as possible:  allergic reactions like skin rash, itching or hives, swelling of the face, lips, or tongue  chest pain  diarrhea  flushing, runny nose, sweating  during infusion  low blood counts - this medicine may decrease the number of white blood cells, red blood cells and platelets. You may be at increased risk for infections and bleeding.  nausea, vomiting  pain, swelling, warmth in the leg  signs of decreased platelets or bleeding - bruising, pinpoint red spots on the skin, black, tarry stools, blood in the urine  signs of infection - fever or chills, cough, sore throat, pain or difficulty passing urine  signs of decreased red blood cells - unusually weak or tired, fainting spells, lightheadedness Side effects that usually do not require medical attention (report to your doctor or health care professional if they continue or are bothersome):  constipation  hair loss  headache  loss of appetite  mouth sores  stomach pain This list  may not describe all possible side effects. Call your doctor for medical advice about side effects. You may report side effects to FDA at 1-800-FDA-1088. Where should I keep my medicine? This drug is given in a hospital or clinic and will not be stored at home. NOTE: This sheet is a summary. It may not cover all possible information. If you have questions about this medicine, talk to your doctor, pharmacist, or health care provider.  2020 Elsevier/Gold Standard (2018-03-31 10:09:17)  Leucovorin injection What is this medicine? LEUCOVORIN (loo koe VOR in) is used to prevent or treat the harmful effects of some medicines. This medicine is used to treat anemia caused by a low amount of folic acid in the body. It is also used with 5-fluorouracil (5-FU) to treat colon cancer. This medicine may be used for other purposes; ask your health care provider or pharmacist if you have questions. What should I tell my health care provider before I take this medicine? They need to know if you have any of these conditions:  anemia from low levels of vitamin B-12 in the blood  an unusual or allergic reaction to  leucovorin, folic acid, other medicines, foods, dyes, or preservatives  pregnant or trying to get pregnant  breast-feeding How should I use this medicine? This medicine is for injection into a muscle or into a vein. It is given by a health care professional in a hospital or clinic setting. Talk to your pediatrician regarding the use of this medicine in children. Special care may be needed. Overdosage: If you think you have taken too much of this medicine contact a poison control center or emergency room at once. NOTE: This medicine is only for you. Do not share this medicine with others. What if I miss a dose? This does not apply. What may interact with this medicine?  capecitabine  fluorouracil  phenobarbital  phenytoin  primidone  trimethoprim-sulfamethoxazole This list may not describe all possible interactions. Give your health care provider a list of all the medicines, herbs, non-prescription drugs, or dietary supplements you use. Also tell them if you smoke, drink alcohol, or use illegal drugs. Some items may interact with your medicine. What should I watch for while using this medicine? Your condition will be monitored carefully while you are receiving this medicine. This medicine may increase the side effects of 5-fluorouracil, 5-FU. Tell your doctor or health care professional if you have diarrhea or mouth sores that do not get better or that get worse. What side effects may I notice from receiving this medicine? Side effects that you should report to your doctor or health care professional as soon as possible:  allergic reactions like skin rash, itching or hives, swelling of the face, lips, or tongue  breathing problems  fever, infection  mouth sores  unusual bleeding or bruising  unusually weak or tired Side effects that usually do not require medical attention (report to your doctor or health care professional if they continue or are bothersome):  constipation  or diarrhea  loss of appetite  nausea, vomiting This list may not describe all possible side effects. Call your doctor for medical advice about side effects. You may report side effects to FDA at 1-800-FDA-1088. Where should I keep my medicine? This drug is given in a hospital or clinic and will not be stored at home. NOTE: This sheet is a summary. It may not cover all possible information. If you have questions about this medicine, talk to your doctor, pharmacist, or  health care provider.  2020 Elsevier/Gold Standard (2007-08-15 16:50:29)  Fluorouracil, 5-FU injection What is this medicine? FLUOROURACIL, 5-FU (flure oh YOOR a sil) is a chemotherapy drug. It slows the growth of cancer cells. This medicine is used to treat many types of cancer like breast cancer, colon or rectal cancer, pancreatic cancer, and stomach cancer. This medicine may be used for other purposes; ask your health care provider or pharmacist if you have questions. COMMON BRAND NAME(S): Adrucil What should I tell my health care provider before I take this medicine? They need to know if you have any of these conditions:  blood disorders  dihydropyrimidine dehydrogenase (DPD) deficiency  infection (especially a virus infection such as chickenpox, cold sores, or herpes)  kidney disease  liver disease  malnourished, poor nutrition  recent or ongoing radiation therapy  an unusual or allergic reaction to fluorouracil, other chemotherapy, other medicines, foods, dyes, or preservatives  pregnant or trying to get pregnant  breast-feeding How should I use this medicine? This drug is given as an infusion or injection into a vein. It is administered in a hospital or clinic by a specially trained health care professional. Talk to your pediatrician regarding the use of this medicine in children. Special care may be needed. Overdosage: If you think you have taken too much of this medicine contact a poison control center  or emergency room at once. NOTE: This medicine is only for you. Do not share this medicine with others. What if I miss a dose? It is important not to miss your dose. Call your doctor or health care professional if you are unable to keep an appointment. What may interact with this medicine?  allopurinol  cimetidine  dapsone  digoxin  hydroxyurea  leucovorin  levamisole  medicines for seizures like ethotoin, fosphenytoin, phenytoin  medicines to increase blood counts like filgrastim, pegfilgrastim, sargramostim  medicines that treat or prevent blood clots like warfarin, enoxaparin, and dalteparin  methotrexate  metronidazole  pyrimethamine  some other chemotherapy drugs like busulfan, cisplatin, estramustine, vinblastine  trimethoprim  trimetrexate  vaccines Talk to your doctor or health care professional before taking any of these medicines:  acetaminophen  aspirin  ibuprofen  ketoprofen  naproxen This list may not describe all possible interactions. Give your health care provider a list of all the medicines, herbs, non-prescription drugs, or dietary supplements you use. Also tell them if you smoke, drink alcohol, or use illegal drugs. Some items may interact with your medicine. What should I watch for while using this medicine? Visit your doctor for checks on your progress. This drug may make you feel generally unwell. This is not uncommon, as chemotherapy can affect healthy cells as well as cancer cells. Report any side effects. Continue your course of treatment even though you feel ill unless your doctor tells you to stop. In some cases, you may be given additional medicines to help with side effects. Follow all directions for their use. Call your doctor or health care professional for advice if you get a fever, chills or sore throat, or other symptoms of a cold or flu. Do not treat yourself. This drug decreases your body's ability to fight infections. Try to  avoid being around people who are sick. This medicine may increase your risk to bruise or bleed. Call your doctor or health care professional if you notice any unusual bleeding. Be careful brushing and flossing your teeth or using a toothpick because you may get an infection or bleed more easily. If  you have any dental work done, tell your dentist you are receiving this medicine. Avoid taking products that contain aspirin, acetaminophen, ibuprofen, naproxen, or ketoprofen unless instructed by your doctor. These medicines may hide a fever. Do not become pregnant while taking this medicine. Women should inform their doctor if they wish to become pregnant or think they might be pregnant. There is a potential for serious side effects to an unborn child. Talk to your health care professional or pharmacist for more information. Do not breast-feed an infant while taking this medicine. Men should inform their doctor if they wish to father a child. This medicine may lower sperm counts. Do not treat diarrhea with over the counter products. Contact your doctor if you have diarrhea that lasts more than 2 days or if it is severe and watery. This medicine can make you more sensitive to the sun. Keep out of the sun. If you cannot avoid being in the sun, wear protective clothing and use sunscreen. Do not use sun lamps or tanning beds/booths. What side effects may I notice from receiving this medicine? Side effects that you should report to your doctor or health care professional as soon as possible:  allergic reactions like skin rash, itching or hives, swelling of the face, lips, or tongue  low blood counts - this medicine may decrease the number of white blood cells, red blood cells and platelets. You may be at increased risk for infections and bleeding.  signs of infection - fever or chills, cough, sore throat, pain or difficulty passing urine  signs of decreased platelets or bleeding - bruising, pinpoint red  spots on the skin, black, tarry stools, blood in the urine  signs of decreased red blood cells - unusually weak or tired, fainting spells, lightheadedness  breathing problems  changes in vision  chest pain  mouth sores  nausea and vomiting  pain, swelling, redness at site where injected  pain, tingling, numbness in the hands or feet  redness, swelling, or sores on hands or feet  stomach pain  unusual bleeding Side effects that usually do not require medical attention (report to your doctor or health care professional if they continue or are bothersome):  changes in finger or toe nails  diarrhea  dry or itchy skin  hair loss  headache  loss of appetite  sensitivity of eyes to the light  stomach upset  unusually teary eyes This list may not describe all possible side effects. Call your doctor for medical advice about side effects. You may report side effects to FDA at 1-800-FDA-1088. Where should I keep my medicine? This drug is given in a hospital or clinic and will not be stored at home. NOTE: This sheet is a summary. It may not cover all possible information. If you have questions about this medicine, talk to your doctor, pharmacist, or health care provider.  2020 [employer] (2007-06-14 13:53:16)

## 2019-06-19 ENCOUNTER — Encounter: Payer: Self-pay | Admitting: Hematology and Oncology

## 2019-06-20 ENCOUNTER — Inpatient Hospital Stay: Payer: BC Managed Care – PPO

## 2019-06-20 ENCOUNTER — Other Ambulatory Visit: Payer: Self-pay

## 2019-06-20 VITALS — BP 118/72 | HR 78 | Temp 98.2°F | Resp 18

## 2019-06-20 DIAGNOSIS — C541 Malignant neoplasm of endometrium: Secondary | ICD-10-CM | POA: Diagnosis not present

## 2019-06-20 DIAGNOSIS — G620 Drug-induced polyneuropathy: Secondary | ICD-10-CM | POA: Diagnosis not present

## 2019-06-20 DIAGNOSIS — E860 Dehydration: Secondary | ICD-10-CM | POA: Diagnosis not present

## 2019-06-20 DIAGNOSIS — R634 Abnormal weight loss: Secondary | ICD-10-CM | POA: Diagnosis not present

## 2019-06-20 DIAGNOSIS — Z5111 Encounter for antineoplastic chemotherapy: Secondary | ICD-10-CM | POA: Diagnosis not present

## 2019-06-20 DIAGNOSIS — C252 Malignant neoplasm of tail of pancreas: Secondary | ICD-10-CM | POA: Diagnosis not present

## 2019-06-20 DIAGNOSIS — M069 Rheumatoid arthritis, unspecified: Secondary | ICD-10-CM | POA: Diagnosis not present

## 2019-06-20 DIAGNOSIS — C259 Malignant neoplasm of pancreas, unspecified: Secondary | ICD-10-CM | POA: Diagnosis not present

## 2019-06-20 DIAGNOSIS — Z79899 Other long term (current) drug therapy: Secondary | ICD-10-CM | POA: Diagnosis not present

## 2019-06-20 DIAGNOSIS — Z7189 Other specified counseling: Secondary | ICD-10-CM

## 2019-06-20 DIAGNOSIS — K521 Toxic gastroenteritis and colitis: Secondary | ICD-10-CM | POA: Diagnosis not present

## 2019-06-20 DIAGNOSIS — C7961 Secondary malignant neoplasm of right ovary: Secondary | ICD-10-CM | POA: Diagnosis not present

## 2019-06-20 MED ORDER — PEGFILGRASTIM-CBQV 6 MG/0.6ML ~~LOC~~ SOSY
PREFILLED_SYRINGE | SUBCUTANEOUS | Status: AC
  Filled 2019-06-20: qty 0.6

## 2019-06-20 MED ORDER — HEPARIN SOD (PORK) LOCK FLUSH 100 UNIT/ML IV SOLN
500.0000 [IU] | Freq: Once | INTRAVENOUS | Status: AC | PRN
  Administered 2019-06-20: 500 [IU]
  Filled 2019-06-20: qty 5

## 2019-06-20 MED ORDER — SODIUM CHLORIDE 0.9% FLUSH
10.0000 mL | INTRAVENOUS | Status: DC | PRN
  Administered 2019-06-20: 14:00:00 10 mL
  Filled 2019-06-20: qty 10

## 2019-06-20 MED ORDER — PEGFILGRASTIM-CBQV 6 MG/0.6ML ~~LOC~~ SOSY
6.0000 mg | PREFILLED_SYRINGE | Freq: Once | SUBCUTANEOUS | Status: AC
  Administered 2019-06-20: 6 mg via SUBCUTANEOUS

## 2019-06-20 NOTE — Patient Instructions (Signed)
Pegfilgrastim injection  What is this medicine?  PEGFILGRASTIM (PEG fil gra stim) is a long-acting granulocyte colony-stimulating factor that stimulates the growth of neutrophils, a type of white blood cell important in the body's fight against infection. It is used to reduce the incidence of fever and infection in patients with certain types of cancer who are receiving chemotherapy that affects the bone marrow, and to increase survival after being exposed to high doses of radiation.  This medicine may be used for other purposes; ask your health care provider or pharmacist if you have questions.  COMMON BRAND NAME(S): Fulphila, Neulasta, UDENYCA  What should I tell my health care provider before I take this medicine?  They need to know if you have any of these conditions:  -kidney disease  -latex allergy  -ongoing radiation therapy  -sickle cell disease  -skin reactions to acrylic adhesives (On-Body Injector only)  -an unusual or allergic reaction to pegfilgrastim, filgrastim, other medicines, foods, dyes, or preservatives  -pregnant or trying to get pregnant  -breast-feeding  How should I use this medicine?  This medicine is for injection under the skin. If you get this medicine at home, you will be taught how to prepare and give the pre-filled syringe or how to use the On-body Injector. Refer to the patient Instructions for Use for detailed instructions. Use exactly as directed. Tell your healthcare provider immediately if you suspect that the On-body Injector may not have performed as intended or if you suspect the use of the On-body Injector resulted in a missed or partial dose.  It is important that you put your used needles and syringes in a special sharps container. Do not put them in a trash can. If you do not have a sharps container, call your pharmacist or healthcare provider to get one.  Talk to your pediatrician regarding the use of this medicine in children. While this drug may be prescribed for  selected conditions, precautions do apply.  Overdosage: If you think you have taken too much of this medicine contact a poison control center or emergency room at once.  NOTE: This medicine is only for you. Do not share this medicine with others.  What if I miss a dose?  It is important not to miss your dose. Call your doctor or health care professional if you miss your dose. If you miss a dose due to an On-body Injector failure or leakage, a new dose should be administered as soon as possible using a single prefilled syringe for manual use.  What may interact with this medicine?  Interactions have not been studied.  Give your health care provider a list of all the medicines, herbs, non-prescription drugs, or dietary supplements you use. Also tell them if you smoke, drink alcohol, or use illegal drugs. Some items may interact with your medicine.  This list may not describe all possible interactions. Give your health care provider a list of all the medicines, herbs, non-prescription drugs, or dietary supplements you use. Also tell them if you smoke, drink alcohol, or use illegal drugs. Some items may interact with your medicine.  What should I watch for while using this medicine?  You may need blood work done while you are taking this medicine.  If you are going to need a MRI, CT scan, or other procedure, tell your doctor that you are using this medicine (On-Body Injector only).  What side effects may I notice from receiving this medicine?  Side effects that you should report to   your doctor or health care professional as soon as possible:  -allergic reactions like skin rash, itching or hives, swelling of the face, lips, or tongue  -back pain  -dizziness  -fever  -pain, redness, or irritation at site where injected  -pinpoint red spots on the skin  -red or dark-brown urine  -shortness of breath or breathing problems  -stomach or side pain, or pain at the shoulder  -swelling  -tiredness  -trouble passing urine or  change in the amount of urine  Side effects that usually do not require medical attention (report to your doctor or health care professional if they continue or are bothersome):  -bone pain  -muscle pain  This list may not describe all possible side effects. Call your doctor for medical advice about side effects. You may report side effects to FDA at 1-800-FDA-1088.  Where should I keep my medicine?  Keep out of the reach of children.  If you are using this medicine at home, you will be instructed on how to store it. Throw away any unused medicine after the expiration date on the label.  NOTE: This sheet is a summary. It may not cover all possible information. If you have questions about this medicine, talk to your doctor, pharmacist, or health care provider.   2019 Elsevier/Gold Standard (2017-05-16 16:57:08)

## 2019-06-22 ENCOUNTER — Other Ambulatory Visit: Payer: Self-pay | Admitting: Hematology and Oncology

## 2019-06-22 DIAGNOSIS — E860 Dehydration: Secondary | ICD-10-CM | POA: Diagnosis not present

## 2019-06-22 DIAGNOSIS — C259 Malignant neoplasm of pancreas, unspecified: Secondary | ICD-10-CM | POA: Diagnosis not present

## 2019-06-22 DIAGNOSIS — C541 Malignant neoplasm of endometrium: Secondary | ICD-10-CM

## 2019-06-22 DIAGNOSIS — K521 Toxic gastroenteritis and colitis: Secondary | ICD-10-CM | POA: Diagnosis not present

## 2019-06-22 DIAGNOSIS — C7961 Secondary malignant neoplasm of right ovary: Secondary | ICD-10-CM

## 2019-06-25 ENCOUNTER — Other Ambulatory Visit: Payer: Self-pay | Admitting: Hematology and Oncology

## 2019-06-25 DIAGNOSIS — C252 Malignant neoplasm of tail of pancreas: Secondary | ICD-10-CM

## 2019-06-25 DIAGNOSIS — I2699 Other pulmonary embolism without acute cor pulmonale: Secondary | ICD-10-CM

## 2019-06-25 MED ORDER — RIVAROXABAN 20 MG PO TABS: 20.0000 mg | ORAL_TABLET | Freq: Every day | ORAL | 9 refills | Status: DC

## 2019-06-27 NOTE — Progress Notes (Signed)
Pharmacist Chemotherapy Monitoring - Follow Up Assessment    I verify that I have reviewed each item in the below checklist:  . Regimen for the patient is scheduled for the appropriate day and plan matches scheduled date. Marland Kitchen Appropriate non-routine labs are ordered dependent on drug ordered. . If applicable, additional medications reviewed and ordered per protocol based on lifetime cumulative doses and/or treatment regimen.   Plan for follow-up and/or issues identified: No . I-vent associated with next due treatment: No . MD and/or nursing notified: No  Dana Hicks D 06/27/2019 4:23 PM

## 2019-07-02 ENCOUNTER — Encounter: Payer: Self-pay | Admitting: Hematology and Oncology

## 2019-07-02 ENCOUNTER — Inpatient Hospital Stay: Payer: BC Managed Care – PPO

## 2019-07-02 ENCOUNTER — Telehealth: Payer: Self-pay

## 2019-07-02 ENCOUNTER — Other Ambulatory Visit: Payer: Self-pay

## 2019-07-02 ENCOUNTER — Inpatient Hospital Stay (HOSPITAL_BASED_OUTPATIENT_CLINIC_OR_DEPARTMENT_OTHER): Payer: BC Managed Care – PPO | Admitting: Hematology and Oncology

## 2019-07-02 ENCOUNTER — Inpatient Hospital Stay: Payer: BC Managed Care – PPO | Attending: Hematology and Oncology

## 2019-07-02 DIAGNOSIS — R110 Nausea: Secondary | ICD-10-CM

## 2019-07-02 DIAGNOSIS — C252 Malignant neoplasm of tail of pancreas: Secondary | ICD-10-CM

## 2019-07-02 DIAGNOSIS — K521 Toxic gastroenteritis and colitis: Secondary | ICD-10-CM

## 2019-07-02 DIAGNOSIS — G893 Neoplasm related pain (acute) (chronic): Secondary | ICD-10-CM | POA: Diagnosis not present

## 2019-07-02 DIAGNOSIS — Z5189 Encounter for other specified aftercare: Secondary | ICD-10-CM | POA: Diagnosis not present

## 2019-07-02 DIAGNOSIS — G620 Drug-induced polyneuropathy: Secondary | ICD-10-CM

## 2019-07-02 DIAGNOSIS — Z7189 Other specified counseling: Secondary | ICD-10-CM

## 2019-07-02 DIAGNOSIS — C541 Malignant neoplasm of endometrium: Secondary | ICD-10-CM

## 2019-07-02 DIAGNOSIS — T451X5A Adverse effect of antineoplastic and immunosuppressive drugs, initial encounter: Secondary | ICD-10-CM

## 2019-07-02 DIAGNOSIS — Z5111 Encounter for antineoplastic chemotherapy: Secondary | ICD-10-CM | POA: Insufficient documentation

## 2019-07-02 DIAGNOSIS — C7961 Secondary malignant neoplasm of right ovary: Secondary | ICD-10-CM

## 2019-07-02 LAB — CBC WITH DIFFERENTIAL (CANCER CENTER ONLY)
Abs Immature Granulocytes: 1.12 10*3/uL — ABNORMAL HIGH (ref 0.00–0.07)
Basophils Absolute: 0.2 10*3/uL — ABNORMAL HIGH (ref 0.0–0.1)
Basophils Relative: 1 %
Eosinophils Absolute: 0.6 10*3/uL — ABNORMAL HIGH (ref 0.0–0.5)
Eosinophils Relative: 4 %
HCT: 37.6 % (ref 36.0–46.0)
Hemoglobin: 11.8 g/dL — ABNORMAL LOW (ref 12.0–15.0)
Immature Granulocytes: 8 %
Lymphocytes Relative: 34 %
Lymphs Abs: 5 10*3/uL — ABNORMAL HIGH (ref 0.7–4.0)
MCH: 28 pg (ref 26.0–34.0)
MCHC: 31.4 g/dL (ref 30.0–36.0)
MCV: 89.3 fL (ref 80.0–100.0)
Monocytes Absolute: 1.4 10*3/uL — ABNORMAL HIGH (ref 0.1–1.0)
Monocytes Relative: 9 %
Neutro Abs: 6.5 10*3/uL (ref 1.7–7.7)
Neutrophils Relative %: 44 %
Platelet Count: 474 10*3/uL — ABNORMAL HIGH (ref 150–400)
RBC: 4.21 MIL/uL (ref 3.87–5.11)
RDW: 17.2 % — ABNORMAL HIGH (ref 11.5–15.5)
WBC Count: 14.7 10*3/uL — ABNORMAL HIGH (ref 4.0–10.5)
nRBC: 0.1 % (ref 0.0–0.2)

## 2019-07-02 LAB — CMP (CANCER CENTER ONLY)
ALT: 30 U/L (ref 0–44)
AST: 17 U/L (ref 15–41)
Albumin: 3.3 g/dL — ABNORMAL LOW (ref 3.5–5.0)
Alkaline Phosphatase: 89 U/L (ref 38–126)
Anion gap: 17 — ABNORMAL HIGH (ref 5–15)
BUN: 11 mg/dL (ref 6–20)
CO2: 24 mmol/L (ref 22–32)
Calcium: 8.9 mg/dL (ref 8.9–10.3)
Chloride: 104 mmol/L (ref 98–111)
Creatinine: 0.62 mg/dL (ref 0.44–1.00)
GFR, Est AFR Am: >60 mL/min (ref >60)
GFR, Estimated: >60 mL/min (ref >60)
Glucose, Bld: 100 mg/dL — ABNORMAL HIGH (ref 70–99)
Potassium: 4 mmol/L (ref 3.5–5.1)
Sodium: 145 mmol/L (ref 135–145)
Total Bilirubin: <0.2 mg/dL — ABNORMAL LOW (ref 0.3–1.2)
Total Protein: 6.5 g/dL (ref 6.5–8.1)

## 2019-07-02 MED ORDER — GABAPENTIN 300 MG PO CAPS: 300.0000 mg | ORAL_CAPSULE | Freq: Three times a day (TID) | ORAL | 11 refills | Status: DC

## 2019-07-02 MED ORDER — HYDROMORPHONE HCL 8 MG PO TABS: 8.0000 mg | ORAL_TABLET | Freq: Four times a day (QID) | ORAL | 0 refills | Status: DC | PRN

## 2019-07-02 MED ORDER — SODIUM CHLORIDE 0.9% FLUSH
10.0000 mL | Freq: Once | INTRAVENOUS | Status: AC
  Administered 2019-07-02: 10 mL
  Filled 2019-07-02: qty 10

## 2019-07-02 NOTE — Assessment & Plan Note (Signed)
She has persistent neuropathy but not worse after recent treatment She will continue taking gabapentin as needed 

## 2019-07-02 NOTE — Telephone Encounter (Signed)
Called and left a verbal order for IV Zofran 8 mg daily/prn 3 x a week for Dana Hicks with [facility]. Ask her to call the office back.

## 2019-07-02 NOTE — Progress Notes (Signed)
[facility] OFFICE PROGRESS NOTE  Patient Care Team: [name], MD as PCP - General (Family Medicine)  ASSESSMENT & PLAN:  Pancreatic cancer [facility]) She had significant side effects with recent treatment despite dose adjustment She has severe nausea, abdominal pain, diarrhea, weight loss as well as persistent neuropathy We will continue treatment as scheduled, for minimum of 4 cycles of treatment before repeat CT imaging I will contact advanced home care service to deliver IV Zofran to her house so that she can use IV Zofran as needed for severe nausea I encouraged her to increase oral intake as tolerated and avoid further weight loss if possible  Cancer associated pain We discussed chronic cancer pain management She will continue to use methadone along with Dilaudid as needed I plan to increase the dose of Dilaudid a little bit I warned her about risk of nausea, constipation and sedation  Chemotherapy-induced nausea Her nausea is poorly controlled despite oral steroids, Compazine as needed and Zofran as needed I recommend IV Zofran at home along with IV fluid support for the first few days of the chemotherapy and she agreed  Diarrhea due to drug She had significant diarrhea for the first few days after treatment She will continue taking Imodium and Lomotil as needed  Peripheral neuropathy due to chemotherapy [facility]) She has persistent neuropathy but not worse after recent treatment She will continue taking gabapentin as needed   No orders of the defined types were placed in this encounter.   All questions were answered. The patient knows to call the clinic with any problems, questions or concerns. The total time spent in the appointment was 30 minutes encounter with patients including review of chart and various tests results, discussions about plan of care and coordination of care plan   Heath Lark, MD 07/02/2019 9:38 AM  INTERVAL HISTORY: Please see below  for problem oriented charting. She returns for cycle 2 of treatment With cycle 1 of therapy, she complained of severe abdominal pain, nausea and intermittent diarrhea within the first few days of treatment She has lost a lot of weight but is able to catch up this past week She denies worsening neuropathy No fever or chills She felt the IV fluids at home is very helpful and is requesting IV Zofran as well if possible Her pain is poorly controlled in the first few days She felt that the dose of Dilaudid might need to be adjusted  SUMMARY OF ONCOLOGIC HISTORY: Oncology History Overview Note  MSI positive  Endometrial :endometrioid Ovarian: 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 Mixed attenuation mass emanates from the right adnexa measuring 10.8 x 8.0 cm very suspicious for right ovarian carcinoma. 2. Abnormality of the tail of the pancreas may be due to mild pancreatitis and small pseudocyst formation, but neoplasm cannot be excluded. 3. Small amount of ascites within abdomen and pelvis. 4. Small nonobstructing renal calculi bilaterally.    08/30/2017 Pathology Results   1. Uterus and cervix, with left fallopian tube - ENDOMETRIOID ADENOCARCINOMA, FIGO GRADE I, ARISING IN A BACKGROUND OF DIFFUSE COMPLEX ATYPICAL HYPERPLASIA. - CARCINOMA INVADES FOR OF DEPTH OF 0.2 CM WHERE THICKNESS OF MYOMETRIAL WALL IS 2.1 CM. - ALL RESECTION MARGINS ARE NEGATIVE FOR CARCINOMA. - NEGATIVE FOR LYMPHOVASCULAR OR PERINEURAL INVASION. - CERVICAL STROMA IS NOT INVOLVED. - SEE ONCOLOGY TABLE. - SEE NOTE 2. Ovary and fallopian tube, right -  PRIMARY OVARIAN ENDOMETRIOID ADENOCARCINOMA, FIGO GRADE II, 12 CM. - THE OVARIAN SURFACE IS FOCALLY INVOLVED BY CARCINOMA. - NEGATIVE FOR LYMPHOVASCULAR INVASION. - BENIGN UNREMARKABLE FALLOPIAN TUBE, NEGATIVE FOR CARCINOMA. - SEE ONCOLOGY TABLE. - SEE NOTE 3.  Cul-de-sac biopsy - METASTATIC ADENOCARCINOMA, MOST CONSISTENT WITH PRIMARY OVARIAN ENDOMETRIOID ADENOCARCINOMA. 4. Ovary, left - BENIGN UNREMARKABLE OVARY, NEGATIVE FOR MALIGNANCY. Microscopic Comment 1. UTERUS, CARCINOMA OR CARCINOSARCOMA Procedure: Total hysterectomy with bilateral salpingo-oophorectomy. Histologic type: Endometrioid adenocarcinoma. Histologic Grade: FIGO Grade I Myometrial invasion: Depth of invasion: 2 mm Myometrial thickness: 21 mm Uterine Serosa Involvement: Not identified Cervical stromal involvement: Not identified Extent of involvement of other organs: Not applicable Lymphovascular invasion: Not identified Regional Lymph Nodes: Examined: 0 Sentinel 0 Non-sentinel 0 Total Tumor block for ancillary studies: 1H MMR / MSI testing: Pending Pathologic Stage Classification (pTNM, AJCC 8th edition): pT1a, pNX (v4.1.0.0) 2. OVARY or FALLOPIAN TUBE or PRIMARY PERITONEUM: Procedure: Salpingo-oophorectomy Specimen Integrity: Intact Tumor Site: Right ovary Ovarian Surface Involvement (required only if applicable): Focally involved by carcinoma Fallopian Tube Surface Involvement (required only if applicable): Not identified Tumor Size: 12 cm Histologic Type: Endometrioid adenocarcinoma Histologic Grade: Grade II Implants (required for advanced stage serous/seromucinous borderline tumors only): Not applicable Other Tissue/ Organ Involvement: Cul de sac biopsy involved by tumor Largest Extrapelvic Peritoneal Focus (required only if applicable): Not applicable Peritoneal/Ascitic Fluid: Negative for carcinoma (case # [id number]) Treatment Effect (required only for high-grade serous carcinomas): Not applicable Regional Lymph Nodes: No lymph nodes submitted or found Number of Lymph Nodes Examined: 0 Pathologic Stage Classification (pTNM, AJCC 8th Edition): pT2b, pN0 Representative Tumor Block: 2B and 2E 1. Molecular study for microsatellite instability and  immunohistochemical stains for MMR-related proteins are pending and will be reported in an addendum. 2. Immunohistochemical stain show that the ovarian tumor is positive for CK7 and PAX8 (both diffuse), CDX2 (patchy and weak); and negative for CK20. This immunoprofile is consistent with the above diagnosis. Dr. Lyndon Code has reviewed this case and concurs with the above diagnosis. Molecular study for microsatellite instability and immunohistochemical stains for MMR-related proteins are pending and will be reported in an addendum   08/30/2017 Genetic Testing   Patient has genetic testing done for MSI  Results revealed patient has the following mutation(s): loss of [facility] 2   08/30/2017 Surgery   Surgeon: Mart Piggs, MD Pre-operative Diagnosis:  1. Adnexal mass 2. Abnormal uterine bleeding 3. H/o Cecal CA  Post-operative Diagnosis:  1. Adhesive disease post colon resection 2. Endometrial cancer NOS 3. Adenocarcinoma unknown origin, right ovary, suspicious for GI primary  Operation:  1. Lysis of adhesions ~30 minutes 2. Robotic-assisted laparoscopic total hysterectomy with right salpingo-oophorectomy and left salpingectomy 3. Left oophorectomy (RA-laparoscopic) 4. Pelvic washings  Findings: Adhesions of omentum to anterior abdominal wall. Enlarged cystic right ovary ~10cm. Uterus had small nodules on serosa near where right adnexa was intimate with the surface. No obvious intraoperative rupture of cyst, although in 2 areas the wall was thin and one of these areas had some bleeding. Slight scarring of left bladder dome to LUS/cervix. Uterus on frozen section c/w hyperplasia and a small focus of endometrial CA - no myo invasion, <2cm in size. Frozen section on the right adnexa was carcinoma, met from colon or possibly Gyn, favor GI primary, defer to permanent. Left ovary was WNL.     09/15/2017 Cancer Staging   Staging form: Corpus Uteri - Carcinoma and Carcinosarcoma, AJCC 8th Edition -  Pathologic: FIGO Stage IA (pT1a, pN0, cM0) -  Signed by Heath Lark, MD on 09/15/2017   09/26/2017 Procedure   Successful placement of a right internal jugular approach power injectable Port-A-Cath. The catheter is ready for immediate use.   11/07/2017 Imaging   1. Since 08/18/2017, similar to slight decrease in size of a pancreatic body/tail junction lesion. Cross modality comparison relative to 09/16/2017 MRI is also grossly similar. 2. No evidence of metastatic disease. 3. Aortic Atherosclerosis (ICD10-I70.0).  4. Left nephrolithiasis.   11/18/2017 Genetic Testing   MSH2 c.2237dupT pathogenic mutation identified in the CancerNext panel.  The CancerNext gene panel offered by [employer] includes sequencing and rearrangement analysis for the following 34 genes:   APC, ATM, BARD1, BMPR1A, BRCA1, BRCA2, BRIP1, CDH1, CDK4, CDKN2A, CHEK2, DICER1, HOXB13, EPCAM, GREM1, MLH1, MRE11A, MSH2, MSH6, MUTYH, NBN, NF1, PALB2, PMS2, POLD1, POLE, PTEN, RAD50, RAD51C, RAD51D, SMAD4, SMARCA4, STK11, and TP53.  The report date is November 18, 2017.  MSH2 [id number] pathogenic mutation identified on somatic testing.  These results are consistent with a diagnosis of Lynch syndrome.   06/18/2019 -  Chemotherapy   The patient had FOLFIRINOX for chemotherapy treatment.     Secondary malignant neoplasm of right ovary ([place])  08/23/2017 Tumor Marker   Patient's tumor was tested for the following markers: CA-125 Results of the tumor marker test revealed 139.8   08/31/2017 Initial Diagnosis   Secondary malignant neoplasm of right ovary ([place])   09/15/2017 Cancer Staging   Staging form: Ovary, Fallopian Tube, and Primary Peritoneal Carcinoma, AJCC 8th Edition - Pathologic: Stage IIB (pT2b, pN0, cM0) - Signed by Heath Lark, MD on 09/15/2017   09/27/2017 Imaging   No evidence of metastatic disease or other acute findings within the thorax.  4 cm low-attenuation mass in pancreatic tail, highly suspicious for  pancreatic carcinoma. This is caused splenic vein thrombosis, with new venous collaterals in the left upper quadrant. Consider endoscopic ultrasound with FNA for tissue diagnosis.  Stable benign hepatic hemangioma.    09/27/2017 Tumor Marker   Patient's tumor was tested for the following markers: CA-125 Results of the tumor marker test revealed 39.4   11/02/2017 Tumor Marker   Patient's tumor was tested for the following markers: CA-125 Results of the tumor marker test revealed 19   11/07/2017 Tumor Marker   Patient's tumor was tested for the following markers: CA-125 Results of the tumor marker test revealed 18.3   11/18/2017 Genetic Testing   MSH2 c.2237dupT pathogenic mutation identified in the CancerNext panel.  The CancerNext gene panel offered by [employer] includes sequencing and rearrangement analysis for the following 34 genes:   APC, ATM, BARD1, BMPR1A, BRCA1, BRCA2, BRIP1, CDH1, CDK4, CDKN2A, CHEK2, DICER1, HOXB13, EPCAM, GREM1, MLH1, MRE11A, MSH2, MSH6, MUTYH, NBN, NF1, PALB2, PMS2, POLD1, POLE, PTEN, RAD50, RAD51C, RAD51D, SMAD4, SMARCA4, STK11, and TP53.  The report date is November 18, 2017.  MSH2 [id number] pathogenic mutation identified on somatic testing.  These results are consistent with a diagnosis of Lynch syndrome.   12/15/2017 Tumor Marker   Patient's tumor was tested for the following markers: CA-125 Results of the tumor marker test revealed 57.3   01/16/2018 Tumor Marker   Patient's tumor was tested for the following markers: CA-125 Results of the tumor marker test revealed 24.2   04/10/2018 Tumor Marker   Patient's tumor was tested for the following markers: CA-125 Results of the tumor marker test revealed 18.2   07/12/2018 Tumor Marker   Patient's tumor was tested for the following markers: CA-125 Results of the tumor marker  test revealed 11.8   10/16/2018 Tumor Marker   Patient's tumor was tested for the following markers: CA125 Results  of the tumor marker test revealed 12.4.   Pancreatic cancer ([place])  10/13/2017 Pathology Results   Pancreas tail mass, endoscopic ultrasound-guided, fine needle aspiration II (smears and cell block): Adenocarcinoma   11/18/2017 Genetic Testing   MSH2 c.2237dupT pathogenic mutation identified in the CancerNext panel.  The CancerNext gene panel offered by [employer] includes sequencing and rearrangement analysis for the following 34 genes:   APC, ATM, BARD1, BMPR1A, BRCA1, BRCA2, BRIP1, CDH1, CDK4, CDKN2A, CHEK2, DICER1, HOXB13, EPCAM, GREM1, MLH1, MRE11A, MSH2, MSH6, MUTYH, NBN, NF1, PALB2, PMS2, POLD1, POLE, PTEN, RAD50, RAD51C, RAD51D, SMAD4, SMARCA4, STK11, and TP53.  The report date is November 18, 2017.  MSH2 [id number] pathogenic mutation identified on somatic testing.  These results are consistent with a diagnosis of Lynch syndrome.   11/24/2017 Pathology Results   A. "TAIL OF PANCREAS AND SPLEEN", DISTAL PANCREATECTOMY AND SPLENECTOMY: Invasive ductal adenocarcinoma, moderately to poorly differentiated with focal signet ring cell features, of pancreas (distal).   The carcinoma is 2.5 cm in greatest dimension grossly.   Treatment effects present in the form of fibrosis (50%). No lymphovascular or definite perineural invasion identified. All surgical margins are negative for tumor or high-grade dysplasia. Adjacent mucinous neoplasm, most consistent with Intraductal papillary mucinous neoplasm (IPMN) with low-grade dysplasia.   Uninvolved pancreas show atrophy and focal acute inflammation.   Twelve benign lymph nodes (0/12). Spleen with no significant histopathologic abnormalities.  PROCEDURE: distal pancreatectomy and splenectomy TUMOR SITE: distal pancreas TUMOR SIZE:  GREATEST DIMENSION: 2.5 cm  ADDITIONAL DIMENSIONS:  x  cm HISTOLOGIC TYPE: ductal adenocarcinoma HISTOLOGIC GRADE: grade 3 TUMOR  EXTENSION: peripancreatic soft tissue MARGINS: negative for tumor TREATMENT EFFECT: treatment effects present in the form of fibrosis (50%). LYMPHOVASCULAR INVASION: not identified PERINEURAL INVASION: no definite evidence REGIONAL LYMPH NODES:   NUMBER OF LYMPH NODES INVOLVED: 0   NUMBER OF LYMPH NODES EXAMINED: 12 PATHOLOGIC STAGE CLASSIFICATION (pTNM, AJCC 8th Ed): ypT2, ypN0 DISTANT METASTASIS (pM): pMx ADDITIONAL PATHOLOGIC FINDINGS: mucinous neoplasm, most consistent with intraductal papillary mucinous neoplasm (IPMN) with low-grade dysplasia, is identified adjacent to the main tumor.   11/24/2017 Cancer Staging   Staging form: Exocrine Pancreas, AJCC 8th Edition - Pathologic stage from 11/24/2017: Stage IB (pT2, pN0, cM0) - Signed by Truitt Merle, MD on 01/03/2018   11/25/2017 Surgery   She had surgery at [facility] 1. Exploratory Laparotomy 2. Distal Pancreatectomy and Splenectomy 3. Intraoperative Ultrasound 4. Open Cholecystectomy    12/15/2017 Cancer Staging   Staging form: Exocrine Pancreas, AJCC 8th Edition - Clinical: Stage IB (cT2, cN0, cM0) - Signed by Heath Lark, MD on 12/15/2017   12/28/2017 Imaging   12/28/2017 CT Abdomen  IMPRESSION: 1. Postoperative findings from recent partial pancreatectomy including a 21 cubic cm fluid collection along the pancreatic resection margin which could represent early pseudocyst. 2. Nodularity along the lateral limb of the left adrenal gland could also be postoperative but merit surveillance, as the pancreatic lesion was in close proximity to this adrenal gland on the prior CT of 11/07/2017. 3. Asymmetric fullness inferiorly in the left breast. The patient has a history of prior left breast procedures, correlation with mammography is recommended. 4. Other imaging findings of potential clinical significance: Stable hemangioma in the left hepatic lobe. Splenectomy. Aortic Atherosclerosis (ICD10-I70.0). Right hemicolectomy. Small  focus of fat necrosis in the right anterior abdominal wall subcutaneous tissues near the laparotomy site.  Bilateral nonobstructive nephrolithiasis.   01/02/2018 Tumor Marker   Patient's tumor was tested for the following markers: CA-19-9 Results of the tumor marker test revealed 5   01/03/2018 - 06/05/2018 Chemotherapy   She received modified dose FOLFIRINOX   04/10/2018 Imaging   1. Distal pancreatectomy, without findings of recurrent or metastatic disease. 2. Incompletely imaged hypoenhancement within lower lobe right pulmonary artery branch is likely chronic (but interval since 10/09/2017) pulmonary embolism. Dedicated CTA could further evaluate. 3. Decrease in size of a peripancreatic complex fluid collection anteriorly, likely a resolving pseudocyst. 4. Decreased size of minimal fluid versus a borderline sized node in the gastrohepatic ligament. Recommend attention on follow-up. 5. Hepatic steatosis with a segment 4 hemangioma. 6. Aortic Atherosclerosis (ICD10-I70.0). This is significantly age advanced.   07/12/2018 Tumor Marker   Patient's tumor was tested for the following markers: CA-19-9 Results of the tumor marker test revealed 6   07/12/2018 Imaging   1. Status post distal pancreatectomy with stable postoperative fluid collection adjacent to the ventral aspect of the pancreatic head. No findings to suggest metastatic disease in the abdomen or pelvis. 2. Hepatic steatosis with small cavernous hemangioma in segment 4A of the liver. 3. Slight decreased size of nonenlarged gastrohepatic ligament lymph node, presumably benign. 4. Aortic atherosclerosis.    10/16/2018 Imaging   Status post distal pancreatectomy. Stable postoperative seroma along the surgical margin.   Status post hysterectomy and right oophorectomy.   Status post right hemicolectomy with appendectomy.   No evidence of recurrent or metastatic disease.   No colonic wall thickening or mass is evident on CT.    01/22/2019 Tumor Marker   Patient's tumor was tested for the following markers: CA-19.9 Results of the tumor marker test revealed 5   01/22/2019 Imaging   1. No evidence of local recurrence or metastatic disease status post distal pancreatectomy and splenectomy. 2. Stable small seroma anterior to the pancreatic head. 3. Postsurgical changes as described. 4. Stable additional incidental findings including a hepatic hemangioma, nonobstructing bilateral renal calculi and aortic Atherosclerosis (ICD10-I70.0).   04/18/2019 Imaging   1. Status post distal pancreatectomy and splenectomy. There has been a gradual increase in ill-defined soft tissue and celiac axis/SMA origin lymph nodes adjacent to the distal pancreatectomy margin and lesser curvature of the stomach/gastric body over sequential prior examinations dated 01/22/2019 and 09/26/2018. An enlarged lymph node or soft tissue nodule adjacent to the SMA origin now measures 1.8 x 0.8 cm. Findings are concerning for local recurrence of pancreatic malignancy. 2. Separately from the above findings, there remains a 1.0 cm low-attenuation nodule anterior to the remnant pancreatic neck, most consistent with postoperative seroma 3. No evidence of distant metastatic disease in the chest, abdomen, or pelvis. 4. Postoperative findings of cholecystectomy, right hemicolectomy, and hysterectomy. 5. Bilateral nonobstructive nephrolithiasis. 6.  Aortic Atherosclerosis (ICD10-I70.0).       05/28/2019 Tumor Marker   Patient's tumor was tested for the following markers: CA19-9 Results of the tumor marker test revealed 18   05/28/2019 Imaging   1. Status post distal pancreatectomy with continued further progression of the ill-defined soft tissue to the left of the celiac axis/SMA origin, now measuring 1.9 x 1.9 cm and highly concerning for recurrent/metastatic disease. This process abuts both the celiac axis, SMA, and posterior wall of the mid stomach. PET-CT may  prove helpful to further evaluate. 2. Bandlike ill-defined soft tissue in the anterior abdomen, potentially peritoneal or omental is similar to perhaps minimally increased qualitatively in the interval. Close  attention on follow-up recommended. 3. Left paraumbilical hernia contains a short segment of small bowel without complicating features. 4. Ill-defined very subtle area of decreased attenuation in the posterior aspect of hepatic segment IV. This is likely focal fatty deposition. Close attention on follow-up recommended. 5.  Aortic Atherosclerois (ICD10-170.0)   06/07/2019 Pathology Results   Malignant cells consistent with adenocarcinoma   06/07/2019 Procedure   ENDOSCOPIC FINDING (limited views with linear echoendoscope): :      The examined esophagus was endoscopically normal.      The entire examined stomach was endoscopically normal.      ENDOSONOGRAPHIC FINDING: :      1. An irregular mass was identified in the region of the pancreatic tail resection site. The mass was stellate with very poorly defined borders. Fine needle aspiration for cytology was performed. Color Doppler imaging was utilized prior to needle puncture to confirm a lack of significant vascular structures within the needle path. Four passes were made with the 25 gauge (FNB) needle using a transgastric approach. A cytotechnologist was present to evaluate the adequacy of the specimen. Final cytology results are pending. Impression:               -  Irregular, stellate soft tissue mass in the region of the pancreatic tail resection site. This was sampled with four transgastric passes with an EUS FNB needle..   06/18/2019 -  Chemotherapy   The patient had FOLFIRINOX for chemotherapy treatment.       REVIEW OF SYSTEMS:   Constitutional: Denies fevers, chills  Eyes: Denies blurriness of vision Ears, nose, mouth, throat, and face: Denies mucositis or sore throat Respiratory: Denies cough, dyspnea or  wheezes Cardiovascular: Denies palpitation, chest discomfort or lower extremity swelling Skin: Denies abnormal skin rashes Lymphatics: Denies new lymphadenopathy or easy bruising Behavioral/Psych: Mood is stable, no new changes  All other systems were reviewed with the patient and are negative.  I have reviewed the past medical history, past surgical history, social history and family history with the patient and they are unchanged from previous note.  ALLERGIES:  is allergic to codeine; penicillins; and bactrim [sulfamethoxazole-trimethoprim].  MEDICATIONS:  Current Outpatient Medications  Medication Sig Dispense Refill  . cetirizine (ZYRTEC) 10 MG tablet Take 10 mg by mouth daily.    . citalopram (CELEXA) 20 MG tablet Take 1 tablet (20 mg total) by mouth daily. 30 tablet 11  . CREON 24000-76000 units CPEP Take 1 capsule by mouth See admin instructions. Take 1 capsule before each meal and 1 capsule after each meal, take 1 cap with snacks    . diclofenac Sodium (VOLTAREN) 1 % GEL Apply 2-4 grams to affected joint 4 times daily as needed. 400 g 1  . diphenoxylate-atropine (LOMOTIL) 2.5-0.025 MG tablet Take 1 tablet by mouth 4 (four) times daily as needed for diarrhea or loose stools. 60 tablet 0  . estradiol (ESTRACE VAGINAL) 0.1 MG/GM vaginal cream Place 1 Applicatorful vaginally 3 (three) times a week. 42.5 g 12  . fluticasone (FLONASE) 50 MCG/ACT nasal spray Place 1 spray into both nostrils daily.    . folic acid (FOLVITE) 1 MG tablet Take 2 tablets (2 mg total) by mouth daily. 180 tablet 3  . gabapentin (NEURONTIN) 300 MG capsule Take 1 capsule (300 mg total) by mouth 3 (three) times daily. 90 capsule 11  . HYDROmorphone (DILAUDID) 8 MG tablet Take 1 tablet (8 mg total) by mouth every 6 (six) hours as needed for severe pain. 90 tablet  0  . hydrOXYzine (ATARAX/VISTARIL) 10 MG tablet Take 1 tablet (10 mg total) by mouth 3 (three) times daily as needed. 30 tablet 1  . lidocaine-prilocaine  (EMLA) cream Apply 1 application topically daily as needed (port). Apply to affected area once 30 g 11  . metFORMIN (GLUCOPHAGE) 500 MG tablet TAKE 1 TABLET (500 MG TOTAL) BY MOUTH 2 (TWO) TIMES DAILY WITH A MEAL. (Patient taking differently: Take 500 mg by mouth 2 (two) times daily with a meal. ) 60 tablet 1  . methadone (DOLOPHINE) 10 MG tablet Take 2 tablets (20 mg total) by mouth every 12 (twelve) hours. 60 tablet 0  . methocarbamol (ROBAXIN) 500 MG tablet TAKE 1 TABLET BY MOUTH 3 TIMES DAILY AS NEEDED FOR MUSCLE SPASMS. (Patient taking differently: Take 500 mg by mouth every 8 (eight) hours as needed for muscle spasms. ) 90 tablet 0  . Multiple Vitamin (MULTIVITAMIN WITH MINERALS) TABS tablet Take 1 tablet by mouth daily.    Marland Kitchen omeprazole (PRILOSEC) 40 MG capsule Take 1 capsule (40 mg total) by mouth daily. 30 capsule 11  . ondansetron (ZOFRAN) 8 MG tablet TAKE 1 TAB BY MOUTH EVERY 8HOURS AS NEEDED FOR REFRACTORY NAUSEA/VOMITING START ON DAY 3AFTER CHEMO 8 tablet 7  . predniSONE (DELTASONE) 1 MG tablet Take 4 tablets (4 mg total) by mouth daily with breakfast. 360 tablet 1  . prochlorperazine (COMPAZINE) 10 MG tablet Take 10 mg by mouth every 6 (six) hours as needed.    . rivaroxaban (XARELTO) 20 MG TABS tablet Take 1 tablet (20 mg total) by mouth daily with supper. 30 tablet 9  . simethicone (MYLICON) 382 MG chewable tablet Chew 125 mg by mouth every 6 (six) hours as needed for flatulence.    . traMADol (ULTRAM) 50 MG tablet Take 1 tablet (50 mg total) by mouth every 6 (six) hours as needed for moderate pain. 60 tablet 0   No current facility-administered medications for this visit.    PHYSICAL EXAMINATION: ECOG PERFORMANCE STATUS: 1 - Symptomatic but completely ambulatory  Vitals:   07/02/19 0832  BP: 127/83  Pulse: 75  Resp: 18  Temp: 98.5 F (36.9 C)  SpO2: 100%   Filed Weights   07/02/19 0832  Weight: 173 lb 12.8 oz (78.8 kg)    GENERAL:alert, no distress and  comfortable SKIN: skin color, texture, turgor are normal, no rashes or significant lesions EYES: normal, Conjunctiva are pink and non-injected, sclera clear OROPHARYNX:no exudate, no erythema and lips, buccal mucosa, and tongue normal  NECK: supple, thyroid normal size, non-tender, without nodularity LYMPH:  no palpable lymphadenopathy in the cervical, axillary or inguinal LUNGS: clear to auscultation and percussion with normal breathing effort HEART: regular rate & rhythm and no murmurs and no lower extremity edema ABDOMEN:abdomen soft, non-tender and normal bowel sounds Musculoskeletal:no cyanosis of digits and no clubbing  NEURO: alert & oriented x 3 with fluent speech, no focal motor/sensory deficits  LABORATORY DATA:  I have reviewed the data as listed    Component Value Date/Time   NA 145 07/02/2019 0815   K 4.0 07/02/2019 0815   CL 104 07/02/2019 0815   CO2 24 07/02/2019 0815   GLUCOSE 100 (H) 07/02/2019 0815   BUN 11 07/02/2019 0815   CREATININE 0.62 07/02/2019 0815   CREATININE 0.68 07/27/2017 0851   CALCIUM 8.9 07/02/2019 0815   PROT 6.5 07/02/2019 0815   ALBUMIN 3.3 (L) 07/02/2019 0815   AST 17 07/02/2019 0815   ALT 30 07/02/2019 0815  ALKPHOS 89 07/02/2019 0815   BILITOT <0.2 (L) 07/02/2019 0815   GFRNONAA >60 07/02/2019 0815   GFRNONAA 106 07/27/2017 0851   GFRAA >60 07/02/2019 0815   GFRAA 123 07/27/2017 0851    No results found for: SPEP, UPEP  Lab Results  Component Value Date   WBC 14.7 (H) 07/02/2019   NEUTROABS 6.5 07/02/2019   HGB 11.8 (L) 07/02/2019   HCT 37.6 07/02/2019   MCV 89.3 07/02/2019   PLT 474 (H) 07/02/2019      Chemistry      Component Value Date/Time   NA 145 07/02/2019 0815   K 4.0 07/02/2019 0815   CL 104 07/02/2019 0815   CO2 24 07/02/2019 0815   BUN 11 07/02/2019 0815   CREATININE 0.62 07/02/2019 0815   CREATININE 0.68 07/27/2017 0851      Component Value Date/Time   CALCIUM 8.9 07/02/2019 0815   ALKPHOS 89 07/02/2019  0815   AST 17 07/02/2019 0815   ALT 30 07/02/2019 0815   BILITOT <0.2 (L) 07/02/2019 0815

## 2019-07-02 NOTE — Assessment & Plan Note (Signed)
She had significant side effects with recent treatment despite dose adjustment She has severe nausea, abdominal pain, diarrhea, weight loss as well as persistent neuropathy We will continue treatment as scheduled, for minimum of 4 cycles of treatment before repeat CT imaging I will contact advanced home care service to deliver IV Zofran to her house so that she can use IV Zofran as needed for severe nausea I encouraged her to increase oral intake as tolerated and avoid further weight loss if possible

## 2019-07-02 NOTE — Assessment & Plan Note (Signed)
She had significant diarrhea for the first few days after treatment She will continue taking Imodium and Lomotil as needed

## 2019-07-02 NOTE — Assessment & Plan Note (Signed)
We discussed chronic cancer pain management She will continue to use methadone along with Dilaudid as needed I plan to increase the dose of Dilaudid a little bit I warned her about risk of nausea, constipation and sedation

## 2019-07-02 NOTE — Assessment & Plan Note (Signed)
Her nausea is poorly controlled despite oral steroids, Compazine as needed and Zofran as needed I recommend IV Zofran at home along with IV fluid support for the first few days of the chemotherapy and she agreed

## 2019-07-02 NOTE — Telephone Encounter (Signed)
Pam with [facility] called back. Given below message. She verbalized understanding.

## 2019-07-03 ENCOUNTER — Other Ambulatory Visit: Payer: Self-pay

## 2019-07-03 ENCOUNTER — Inpatient Hospital Stay: Payer: BC Managed Care – PPO

## 2019-07-03 ENCOUNTER — Encounter: Payer: Self-pay | Admitting: Hematology and Oncology

## 2019-07-03 ENCOUNTER — Ambulatory Visit: Payer: BC Managed Care – PPO

## 2019-07-03 ENCOUNTER — Telehealth: Payer: Self-pay | Admitting: Hematology and Oncology

## 2019-07-03 VITALS — BP 130/86 | HR 89 | Temp 98.3°F | Resp 18

## 2019-07-03 DIAGNOSIS — E860 Dehydration: Secondary | ICD-10-CM | POA: Diagnosis not present

## 2019-07-03 DIAGNOSIS — K521 Toxic gastroenteritis and colitis: Secondary | ICD-10-CM | POA: Diagnosis not present

## 2019-07-03 DIAGNOSIS — Z7189 Other specified counseling: Secondary | ICD-10-CM

## 2019-07-03 DIAGNOSIS — C252 Malignant neoplasm of tail of pancreas: Secondary | ICD-10-CM | POA: Diagnosis not present

## 2019-07-03 DIAGNOSIS — C259 Malignant neoplasm of pancreas, unspecified: Secondary | ICD-10-CM | POA: Diagnosis not present

## 2019-07-03 DIAGNOSIS — C541 Malignant neoplasm of endometrium: Secondary | ICD-10-CM

## 2019-07-03 DIAGNOSIS — Z5111 Encounter for antineoplastic chemotherapy: Secondary | ICD-10-CM | POA: Diagnosis not present

## 2019-07-03 DIAGNOSIS — Z5189 Encounter for other specified aftercare: Secondary | ICD-10-CM | POA: Diagnosis not present

## 2019-07-03 MED ORDER — DIPHENHYDRAMINE HCL 50 MG/ML IJ SOLN
50.0000 mg | Freq: Once | INTRAMUSCULAR | Status: AC
  Administered 2019-07-03: 50 mg via INTRAVENOUS

## 2019-07-03 MED ORDER — SODIUM CHLORIDE 0.9 % IV SOLN
150.0000 mg | Freq: Once | INTRAVENOUS | Status: AC
  Administered 2019-07-03: 150 mg via INTRAVENOUS
  Filled 2019-07-03: qty 150

## 2019-07-03 MED ORDER — SODIUM CHLORIDE 0.9 % IV SOLN
1960.0000 mg/m2 | INTRAVENOUS | Status: DC
  Administered 2019-07-03: 3650 mg via INTRAVENOUS
  Filled 2019-07-03: qty 73

## 2019-07-03 MED ORDER — FAMOTIDINE IN NACL 20-0.9 MG/50ML-% IV SOLN
20.0000 mg | Freq: Once | INTRAVENOUS | Status: AC
  Administered 2019-07-03: 20 mg via INTRAVENOUS

## 2019-07-03 MED ORDER — FAMOTIDINE IN NACL 20-0.9 MG/50ML-% IV SOLN
INTRAVENOUS | Status: AC
  Filled 2019-07-03: qty 50

## 2019-07-03 MED ORDER — DIPHENHYDRAMINE HCL 50 MG/ML IJ SOLN
INTRAMUSCULAR | Status: AC
  Filled 2019-07-03: qty 1

## 2019-07-03 MED ORDER — SODIUM CHLORIDE 0.9% FLUSH
10.0000 mL | INTRAVENOUS | Status: DC | PRN
  Filled 2019-07-03: qty 10

## 2019-07-03 MED ORDER — PALONOSETRON HCL INJECTION 0.25 MG/5ML
INTRAVENOUS | Status: AC
  Filled 2019-07-03: qty 5

## 2019-07-03 MED ORDER — SODIUM CHLORIDE 0.9 % IV SOLN
411.0000 mg/m2 | Freq: Once | INTRAVENOUS | Status: AC
  Administered 2019-07-03: 764 mg via INTRAVENOUS
  Filled 2019-07-03: qty 38.2

## 2019-07-03 MED ORDER — SODIUM CHLORIDE 0.9 % IV SOLN
10.0000 mg | Freq: Once | INTRAVENOUS | Status: AC
  Administered 2019-07-03: 10 mg via INTRAVENOUS
  Filled 2019-07-03: qty 10

## 2019-07-03 MED ORDER — DEXTROSE 5 % IV SOLN
Freq: Once | INTRAVENOUS | Status: AC
  Filled 2019-07-03: qty 250

## 2019-07-03 MED ORDER — OXALIPLATIN CHEMO INJECTION 100 MG/20ML
42.5000 mg/m2 | Freq: Once | INTRAVENOUS | Status: AC
  Administered 2019-07-03: 80 mg via INTRAVENOUS
  Filled 2019-07-03: qty 16

## 2019-07-03 MED ORDER — DEXTROSE 5 % IV SOLN
Freq: Once | INTRAVENOUS | Status: DC
  Filled 2019-07-03: qty 250

## 2019-07-03 MED ORDER — ATROPINE SULFATE 1 MG/ML IJ SOLN
0.4000 mg | Freq: Once | INTRAMUSCULAR | Status: AC
  Administered 2019-07-03: 0.4 mg via INTRAVENOUS

## 2019-07-03 MED ORDER — ATROPINE SULFATE 0.4 MG/ML IJ SOLN
INTRAMUSCULAR | Status: AC
  Filled 2019-07-03: qty 1

## 2019-07-03 MED ORDER — ATROPINE SULFATE 1 MG/ML IJ SOLN
INTRAMUSCULAR | Status: AC
  Filled 2019-07-03: qty 1

## 2019-07-03 MED ORDER — PALONOSETRON HCL INJECTION 0.25 MG/5ML
0.2500 mg | Freq: Once | INTRAVENOUS | Status: AC
  Administered 2019-07-03: 0.25 mg via INTRAVENOUS

## 2019-07-03 MED ORDER — SODIUM CHLORIDE 0.9 % IV SOLN
110.0000 mg/m2 | Freq: Once | INTRAVENOUS | Status: AC
  Administered 2019-07-03: 200 mg via INTRAVENOUS
  Filled 2019-07-03: qty 10

## 2019-07-03 NOTE — Patient Instructions (Signed)
[facility] Discharge Instructions for Patients Receiving Chemotherapy  Today you received the following chemotherapy agents Oxaliplatin; irinotecan; leucovorin; 5-fu  To help prevent nausea and vomiting after your treatment, we encourage you to take your nausea medication as directed   If you develop nausea and vomiting that is not controlled by your nausea medication, call the clinic.   BELOW ARE SYMPTOMS THAT SHOULD BE REPORTED IMMEDIATELY:  *FEVER GREATER THAN 100.5 F  *CHILLS WITH OR WITHOUT FEVER  NAUSEA AND VOMITING THAT IS NOT CONTROLLED WITH YOUR NAUSEA MEDICATION  *UNUSUAL SHORTNESS OF BREATH  *UNUSUAL BRUISING OR BLEEDING  TENDERNESS IN MOUTH AND THROAT WITH OR WITHOUT PRESENCE OF ULCERS  *URINARY PROBLEMS  *BOWEL PROBLEMS  UNUSUAL RASH Items with * indicate a potential emergency and should be followed up as soon as possible.  Feel free to call the clinic should you have any questions or concerns. The clinic phone number is [phone].  Please show the [place] at check-in to the Emergency Department and triage nurse.   Oxaliplatin Injection What is this medicine? OXALIPLATIN (ox AL i PLA tin) is a chemotherapy drug. It targets fast dividing cells, like cancer cells, and causes these cells to die. This medicine is used to treat cancers of the colon and rectum, and many other cancers. This medicine may be used for other purposes; ask your health care provider or pharmacist if you have questions. COMMON BRAND NAME(S): Eloxatin What should I tell my health care provider before I take this medicine? They need to know if you have any of these conditions:  heart disease  history of irregular heartbeat  liver disease  low blood counts, like white cells, platelets, or red blood cells  lung or breathing disease, like asthma  take medicines that treat or prevent blood clots  tingling of the fingers or toes, or other nerve  disorder  an unusual or allergic reaction to oxaliplatin, other chemotherapy, other medicines, foods, dyes, or preservatives  pregnant or trying to get pregnant  breast-feeding How should I use this medicine? This drug is given as an infusion into a vein. It is administered in a hospital or clinic by a specially trained health care professional. Talk to your pediatrician regarding the use of this medicine in children. Special care may be needed. Overdosage: If you think you have taken too much of this medicine contact a poison control center or emergency room at once. NOTE: This medicine is only for you. Do not share this medicine with others. What if I miss a dose? It is important not to miss a dose. Call your doctor or health care professional if you are unable to keep an appointment. What may interact with this medicine? Do not take this medicine with any of the following medications:  cisapride  dronedarone  pimozide  thioridazine This medicine may also interact with the following medications:  aspirin and aspirin-like medicines  certain medicines that treat or prevent blood clots like warfarin, apixaban, dabigatran, and rivaroxaban  cisplatin  cyclosporine  diuretics  medicines for infection like acyclovir, adefovir, amphotericin B, bacitracin, cidofovir, foscarnet, ganciclovir, gentamicin, pentamidine, vancomycin  NSAIDs, medicines for pain and inflammation, like ibuprofen or naproxen  other medicines that prolong the QT interval (an abnormal heart rhythm)  pamidronate  zoledronic acid This list may not describe all possible interactions. Give your health care provider a list of all the medicines, herbs, non-prescription drugs, or dietary supplements you use. Also tell them if you smoke,  drink alcohol, or use illegal drugs. Some items may interact with your medicine. What should I watch for while using this medicine? Your condition will be monitored carefully  while you are receiving this medicine. You may need blood work done while you are taking this medicine. This medicine may make you feel generally unwell. This is not uncommon as chemotherapy can affect healthy cells as well as cancer cells. Report any side effects. Continue your course of treatment even though you feel ill unless your healthcare professional tells you to stop. This medicine can make you more sensitive to cold. Do not drink cold drinks or use ice. Cover exposed skin before coming in contact with cold temperatures or cold objects. When out in cold weather wear warm clothing and cover your mouth and nose to warm the air that goes into your lungs. Tell your doctor if you get sensitive to the cold. Do not become pregnant while taking this medicine or for 9 months after stopping it. Women should inform their health care professional if they wish to become pregnant or think they might be pregnant. Men should not father a child while taking this medicine and for 6 months after stopping it. There is potential for serious side effects to an unborn child. Talk to your health care professional for more information. Do not breast-feed a child while taking this medicine or for 3 months after stopping it. This medicine has caused ovarian failure in some women. This medicine may make it more difficult to get pregnant. Talk to your health care professional if you are concerned about your fertility. This medicine has caused decreased sperm counts in some men. This may make it more difficult to father a child. Talk to your health care professional if you are concerned about your fertility. This medicine may increase your risk of getting an infection. Call your health care professional for advice if you get a fever, chills, or sore throat, or other symptoms of a cold or flu. Do not treat yourself. Try to avoid being around people who are sick. Avoid taking medicines that contain aspirin, acetaminophen,  ibuprofen, naproxen, or ketoprofen unless instructed by your health care professional. These medicines may hide a fever. Be careful brushing or flossing your teeth or using a toothpick because you may get an infection or bleed more easily. If you have any dental work done, tell your dentist you are receiving this medicine. What side effects may I notice from receiving this medicine? Side effects that you should report to your doctor or health care professional as soon as possible:  allergic reactions like skin rash, itching or hives, swelling of the face, lips, or tongue  breathing problems  cough  low blood counts - this medicine may decrease the number of white blood cells, red blood cells, and platelets. You may be at increased risk for infections and bleeding  nausea, vomiting  pain, redness, or irritation at site where injected  pain, tingling, numbness in the hands or feet  signs and symptoms of bleeding such as bloody or black, tarry stools; red or dark brown urine; spitting up blood or brown material that looks like coffee grounds; red spots on the skin; unusual bruising or bleeding from the eyes, gums, or nose  signs and symptoms of a dangerous change in heartbeat or heart rhythm like chest pain; dizziness; fast, irregular heartbeat; palpitations; feeling faint or lightheaded; falls  signs and symptoms of infection like fever; chills; cough; sore throat; pain or trouble passing  urine  signs and symptoms of liver injury like dark yellow or brown urine; general ill feeling or flu-like symptoms; light-colored stools; loss of appetite; nausea; right upper belly pain; unusually weak or tired; yellowing of the eyes or skin  signs and symptoms of low red blood cells or anemia such as unusually weak or tired; feeling faint or lightheaded; falls  signs and symptoms of muscle injury like dark urine; trouble passing urine or change in the amount of urine; unusually weak or tired; muscle  pain; back pain Side effects that usually do not require medical attention (report to your doctor or health care professional if they continue or are bothersome):  changes in taste  diarrhea  gas  hair loss  loss of appetite  mouth sores This list may not describe all possible side effects. Call your doctor for medical advice about side effects. You may report side effects to FDA at 1-800-FDA-1088. Where should I keep my medicine? This drug is given in a hospital or clinic and will not be stored at home. NOTE: This sheet is a summary. It may not cover all possible information. If you have questions about this medicine, talk to your doctor, pharmacist, or health care provider.  2020 Elsevier/Gold Standard (2018-06-28 12:20:35)  Irinotecan injection What is this medicine? IRINOTECAN (ir in oh TEE kan ) is a chemotherapy drug. It is used to treat colon and rectal cancer. This medicine may be used for other purposes; ask your health care provider or pharmacist if you have questions. COMMON BRAND NAME(S): Camptosar What should I tell my health care provider before I take this medicine? They need to know if you have any of these conditions:  dehydration  diarrhea  infection (especially a virus infection such as chickenpox, cold sores, or herpes)  liver disease  low blood counts, like low white cell, platelet, or red cell counts  low levels of calcium, magnesium, or potassium in the blood  recent or ongoing radiation therapy  an unusual or allergic reaction to irinotecan, other medicines, foods, dyes, or preservatives  pregnant or trying to get pregnant  breast-feeding How should I use this medicine? This drug is given as an infusion into a vein. It is administered in a hospital or clinic by a specially trained health care professional. Talk to your pediatrician regarding the use of this medicine in children. Special care may be needed. Overdosage: If you think you have  taken too much of this medicine contact a poison control center or emergency room at once. NOTE: This medicine is only for you. Do not share this medicine with others. What if I miss a dose? It is important not to miss your dose. Call your doctor or health care professional if you are unable to keep an appointment. What may interact with this medicine? This medicine may interact with the following medications:  antiviral medicines for HIV or AIDS  certain antibiotics like rifampin or rifabutin  certain medicines for fungal infections like itraconazole, ketoconazole, posaconazole, and voriconazole  certain medicines for seizures like carbamazepine, phenobarbital, phenotoin  clarithromycin  gemfibrozil  nefazodone  St. John's Wort This list may not describe all possible interactions. Give your health care provider a list of all the medicines, herbs, non-prescription drugs, or dietary supplements you use. Also tell them if you smoke, drink alcohol, or use illegal drugs. Some items may interact with your medicine. What should I watch for while using this medicine? Your condition will be monitored carefully while you are  receiving this medicine. You will need important blood work done while you are taking this medicine. This drug may make you feel generally unwell. This is not uncommon, as chemotherapy can affect healthy cells as well as cancer cells. Report any side effects. Continue your course of treatment even though you feel ill unless your doctor tells you to stop. In some cases, you may be given additional medicines to help with side effects. Follow all directions for their use. You may get drowsy or dizzy. Do not drive, use machinery, or do anything that needs mental alertness until you know how this medicine affects you. Do not stand or sit up quickly, especially if you are an older patient. This reduces the risk of dizzy or fainting spells. Call your health care professional for  advice if you get a fever, chills, or sore throat, or other symptoms of a cold or flu. Do not treat yourself. This medicine decreases your body's ability to fight infections. Try to avoid being around people who are sick. Avoid taking products that contain aspirin, acetaminophen, ibuprofen, naproxen, or ketoprofen unless instructed by your doctor. These medicines may hide a fever. This medicine may increase your risk to bruise or bleed. Call your doctor or health care professional if you notice any unusual bleeding. Be careful brushing and flossing your teeth or using a toothpick because you may get an infection or bleed more easily. If you have any dental work done, tell your dentist you are receiving this medicine. Do not become pregnant while taking this medicine or for 6 months after stopping it. Women should inform their health care professional if they wish to become pregnant or think they might be pregnant. Men should not father a child while taking this medicine and for 3 months after stopping it. There is potential for serious side effects to an unborn child. Talk to your health care professional for more information. Do not breast-feed an infant while taking this medicine or for 7 days after stopping it. This medicine has caused ovarian failure in some women. This medicine may make it more difficult to get pregnant. Talk to your health care professional if you are concerned about your fertility. This medicine has caused decreased sperm counts in some men. This may make it more difficult to father a child. Talk to your health care professional if you are concerned about your fertility. What side effects may I notice from receiving this medicine? Side effects that you should report to your doctor or health care professional as soon as possible:  allergic reactions like skin rash, itching or hives, swelling of the face, lips, or tongue  chest pain  diarrhea  flushing, runny nose, sweating  during infusion  low blood counts - this medicine may decrease the number of white blood cells, red blood cells and platelets. You may be at increased risk for infections and bleeding.  nausea, vomiting  pain, swelling, warmth in the leg  signs of decreased platelets or bleeding - bruising, pinpoint red spots on the skin, black, tarry stools, blood in the urine  signs of infection - fever or chills, cough, sore throat, pain or difficulty passing urine  signs of decreased red blood cells - unusually weak or tired, fainting spells, lightheadedness Side effects that usually do not require medical attention (report to your doctor or health care professional if they continue or are bothersome):  constipation  hair loss  headache  loss of appetite  mouth sores  stomach pain This list  may not describe all possible side effects. Call your doctor for medical advice about side effects. You may report side effects to FDA at 1-800-FDA-1088. Where should I keep my medicine? This drug is given in a hospital or clinic and will not be stored at home. NOTE: This sheet is a summary. It may not cover all possible information. If you have questions about this medicine, talk to your doctor, pharmacist, or health care provider.  2020 Elsevier/Gold Standard (2018-03-31 10:09:17)  Leucovorin injection What is this medicine? LEUCOVORIN (loo koe VOR in) is used to prevent or treat the harmful effects of some medicines. This medicine is used to treat anemia caused by a low amount of folic acid in the body. It is also used with 5-fluorouracil (5-FU) to treat colon cancer. This medicine may be used for other purposes; ask your health care provider or pharmacist if you have questions. What should I tell my health care provider before I take this medicine? They need to know if you have any of these conditions:  anemia from low levels of vitamin B-12 in the blood  an unusual or allergic reaction to  leucovorin, folic acid, other medicines, foods, dyes, or preservatives  pregnant or trying to get pregnant  breast-feeding How should I use this medicine? This medicine is for injection into a muscle or into a vein. It is given by a health care professional in a hospital or clinic setting. Talk to your pediatrician regarding the use of this medicine in children. Special care may be needed. Overdosage: If you think you have taken too much of this medicine contact a poison control center or emergency room at once. NOTE: This medicine is only for you. Do not share this medicine with others. What if I miss a dose? This does not apply. What may interact with this medicine?  capecitabine  fluorouracil  phenobarbital  phenytoin  primidone  trimethoprim-sulfamethoxazole This list may not describe all possible interactions. Give your health care provider a list of all the medicines, herbs, non-prescription drugs, or dietary supplements you use. Also tell them if you smoke, drink alcohol, or use illegal drugs. Some items may interact with your medicine. What should I watch for while using this medicine? Your condition will be monitored carefully while you are receiving this medicine. This medicine may increase the side effects of 5-fluorouracil, 5-FU. Tell your doctor or health care professional if you have diarrhea or mouth sores that do not get better or that get worse. What side effects may I notice from receiving this medicine? Side effects that you should report to your doctor or health care professional as soon as possible:  allergic reactions like skin rash, itching or hives, swelling of the face, lips, or tongue  breathing problems  fever, infection  mouth sores  unusual bleeding or bruising  unusually weak or tired Side effects that usually do not require medical attention (report to your doctor or health care professional if they continue or are bothersome):  constipation  or diarrhea  loss of appetite  nausea, vomiting This list may not describe all possible side effects. Call your doctor for medical advice about side effects. You may report side effects to FDA at 1-800-FDA-1088. Where should I keep my medicine? This drug is given in a hospital or clinic and will not be stored at home. NOTE: This sheet is a summary. It may not cover all possible information. If you have questions about this medicine, talk to your doctor, pharmacist, or  health care provider.  2020 Elsevier/Gold Standard (2007-08-15 16:50:29)  Fluorouracil, 5-FU injection What is this medicine? FLUOROURACIL, 5-FU (flure oh YOOR a sil) is a chemotherapy drug. It slows the growth of cancer cells. This medicine is used to treat many types of cancer like breast cancer, colon or rectal cancer, pancreatic cancer, and stomach cancer. This medicine may be used for other purposes; ask your health care provider or pharmacist if you have questions. COMMON BRAND NAME(S): Adrucil What should I tell my health care provider before I take this medicine? They need to know if you have any of these conditions:  blood disorders  dihydropyrimidine dehydrogenase (DPD) deficiency  infection (especially a virus infection such as chickenpox, cold sores, or herpes)  kidney disease  liver disease  malnourished, poor nutrition  recent or ongoing radiation therapy  an unusual or allergic reaction to fluorouracil, other chemotherapy, other medicines, foods, dyes, or preservatives  pregnant or trying to get pregnant  breast-feeding How should I use this medicine? This drug is given as an infusion or injection into a vein. It is administered in a hospital or clinic by a specially trained health care professional. Talk to your pediatrician regarding the use of this medicine in children. Special care may be needed. Overdosage: If you think you have taken too much of this medicine contact a poison control center  or emergency room at once. NOTE: This medicine is only for you. Do not share this medicine with others. What if I miss a dose? It is important not to miss your dose. Call your doctor or health care professional if you are unable to keep an appointment. What may interact with this medicine?  allopurinol  cimetidine  dapsone  digoxin  hydroxyurea  leucovorin  levamisole  medicines for seizures like ethotoin, fosphenytoin, phenytoin  medicines to increase blood counts like filgrastim, pegfilgrastim, sargramostim  medicines that treat or prevent blood clots like warfarin, enoxaparin, and dalteparin  methotrexate  metronidazole  pyrimethamine  some other chemotherapy drugs like busulfan, cisplatin, estramustine, vinblastine  trimethoprim  trimetrexate  vaccines Talk to your doctor or health care professional before taking any of these medicines:  acetaminophen  aspirin  ibuprofen  ketoprofen  naproxen This list may not describe all possible interactions. Give your health care provider a list of all the medicines, herbs, non-prescription drugs, or dietary supplements you use. Also tell them if you smoke, drink alcohol, or use illegal drugs. Some items may interact with your medicine. What should I watch for while using this medicine? Visit your doctor for checks on your progress. This drug may make you feel generally unwell. This is not uncommon, as chemotherapy can affect healthy cells as well as cancer cells. Report any side effects. Continue your course of treatment even though you feel ill unless your doctor tells you to stop. In some cases, you may be given additional medicines to help with side effects. Follow all directions for their use. Call your doctor or health care professional for advice if you get a fever, chills or sore throat, or other symptoms of a cold or flu. Do not treat yourself. This drug decreases your body's ability to fight infections. Try to  avoid being around people who are sick. This medicine may increase your risk to bruise or bleed. Call your doctor or health care professional if you notice any unusual bleeding. Be careful brushing and flossing your teeth or using a toothpick because you may get an infection or bleed more easily. If  you have any dental work done, tell your dentist you are receiving this medicine. Avoid taking products that contain aspirin, acetaminophen, ibuprofen, naproxen, or ketoprofen unless instructed by your doctor. These medicines may hide a fever. Do not become pregnant while taking this medicine. Women should inform their doctor if they wish to become pregnant or think they might be pregnant. There is a potential for serious side effects to an unborn child. Talk to your health care professional or pharmacist for more information. Do not breast-feed an infant while taking this medicine. Men should inform their doctor if they wish to father a child. This medicine may lower sperm counts. Do not treat diarrhea with over the counter products. Contact your doctor if you have diarrhea that lasts more than 2 days or if it is severe and watery. This medicine can make you more sensitive to the sun. Keep out of the sun. If you cannot avoid being in the sun, wear protective clothing and use sunscreen. Do not use sun lamps or tanning beds/booths. What side effects may I notice from receiving this medicine? Side effects that you should report to your doctor or health care professional as soon as possible:  allergic reactions like skin rash, itching or hives, swelling of the face, lips, or tongue  low blood counts - this medicine may decrease the number of white blood cells, red blood cells and platelets. You may be at increased risk for infections and bleeding.  signs of infection - fever or chills, cough, sore throat, pain or difficulty passing urine  signs of decreased platelets or bleeding - bruising, pinpoint red  spots on the skin, black, tarry stools, blood in the urine  signs of decreased red blood cells - unusually weak or tired, fainting spells, lightheadedness  breathing problems  changes in vision  chest pain  mouth sores  nausea and vomiting  pain, swelling, redness at site where injected  pain, tingling, numbness in the hands or feet  redness, swelling, or sores on hands or feet  stomach pain  unusual bleeding Side effects that usually do not require medical attention (report to your doctor or health care professional if they continue or are bothersome):  changes in finger or toe nails  diarrhea  dry or itchy skin  hair loss  headache  loss of appetite  sensitivity of eyes to the light  stomach upset  unusually teary eyes This list may not describe all possible side effects. Call your doctor for medical advice about side effects. You may report side effects to FDA at 1-800-FDA-1088. Where should I keep my medicine? This drug is given in a hospital or clinic and will not be stored at home. NOTE: This sheet is a summary. It may not cover all possible information. If you have questions about this medicine, talk to your doctor, pharmacist, or health care provider.  2020 [employer] (2007-06-14 13:53:16)

## 2019-07-03 NOTE — Telephone Encounter (Signed)
Scheduled appts per 5/10 sch msg. Left voicemail with appt info.

## 2019-07-05 ENCOUNTER — Other Ambulatory Visit: Payer: Self-pay

## 2019-07-05 ENCOUNTER — Inpatient Hospital Stay: Payer: BC Managed Care – PPO

## 2019-07-05 VITALS — BP 104/65 | HR 61 | Temp 98.7°F | Resp 18

## 2019-07-05 DIAGNOSIS — Z5189 Encounter for other specified aftercare: Secondary | ICD-10-CM | POA: Diagnosis not present

## 2019-07-05 DIAGNOSIS — Z5111 Encounter for antineoplastic chemotherapy: Secondary | ICD-10-CM | POA: Diagnosis not present

## 2019-07-05 DIAGNOSIS — C252 Malignant neoplasm of tail of pancreas: Secondary | ICD-10-CM | POA: Diagnosis not present

## 2019-07-05 DIAGNOSIS — C541 Malignant neoplasm of endometrium: Secondary | ICD-10-CM

## 2019-07-05 DIAGNOSIS — Z7189 Other specified counseling: Secondary | ICD-10-CM

## 2019-07-05 MED ORDER — SODIUM CHLORIDE 0.9% FLUSH
3.0000 mL | INTRAVENOUS | Status: DC | PRN
  Filled 2019-07-05: qty 10

## 2019-07-05 MED ORDER — PEGFILGRASTIM-CBQV 6 MG/0.6ML ~~LOC~~ SOSY
PREFILLED_SYRINGE | SUBCUTANEOUS | Status: AC
  Filled 2019-07-05: qty 0.6

## 2019-07-05 MED ORDER — SODIUM CHLORIDE 0.9% FLUSH
10.0000 mL | INTRAVENOUS | Status: DC | PRN
  Administered 2019-07-05: 10 mL
  Filled 2019-07-05: qty 10

## 2019-07-05 MED ORDER — HEPARIN SOD (PORK) LOCK FLUSH 100 UNIT/ML IV SOLN
500.0000 [IU] | Freq: Once | INTRAVENOUS | Status: DC | PRN
  Filled 2019-07-05: qty 5

## 2019-07-05 MED ORDER — PEGFILGRASTIM-CBQV 6 MG/0.6ML ~~LOC~~ SOSY
6.0000 mg | PREFILLED_SYRINGE | Freq: Once | SUBCUTANEOUS | Status: AC
  Administered 2019-07-05: 6 mg via SUBCUTANEOUS

## 2019-07-05 NOTE — Patient Instructions (Signed)

## 2019-07-09 DIAGNOSIS — M546 Pain in thoracic spine: Secondary | ICD-10-CM | POA: Diagnosis not present

## 2019-07-09 DIAGNOSIS — G8929 Other chronic pain: Secondary | ICD-10-CM | POA: Diagnosis not present

## 2019-07-09 DIAGNOSIS — C252 Malignant neoplasm of tail of pancreas: Secondary | ICD-10-CM | POA: Diagnosis not present

## 2019-07-09 DIAGNOSIS — K521 Toxic gastroenteritis and colitis: Secondary | ICD-10-CM | POA: Diagnosis not present

## 2019-07-09 DIAGNOSIS — C259 Malignant neoplasm of pancreas, unspecified: Secondary | ICD-10-CM | POA: Diagnosis not present

## 2019-07-09 DIAGNOSIS — E860 Dehydration: Secondary | ICD-10-CM | POA: Diagnosis not present

## 2019-07-09 DIAGNOSIS — C182 Malignant neoplasm of ascending colon: Secondary | ICD-10-CM | POA: Diagnosis not present

## 2019-07-10 ENCOUNTER — Encounter: Payer: Self-pay | Admitting: Hematology and Oncology

## 2019-07-10 ENCOUNTER — Other Ambulatory Visit: Payer: Self-pay | Admitting: Hematology and Oncology

## 2019-07-10 MED ORDER — METHADONE HCL 10 MG PO TABS: 20.0000 mg | ORAL_TABLET | Freq: Two times a day (BID) | ORAL | 0 refills | Status: DC

## 2019-07-11 ENCOUNTER — Other Ambulatory Visit: Payer: Self-pay | Admitting: Hematology and Oncology

## 2019-07-11 DIAGNOSIS — Z1509 Genetic susceptibility to other malignant neoplasm: Secondary | ICD-10-CM | POA: Diagnosis not present

## 2019-07-11 DIAGNOSIS — E860 Dehydration: Secondary | ICD-10-CM | POA: Diagnosis not present

## 2019-07-11 DIAGNOSIS — C259 Malignant neoplasm of pancreas, unspecified: Secondary | ICD-10-CM | POA: Diagnosis not present

## 2019-07-11 DIAGNOSIS — F418 Other specified anxiety disorders: Secondary | ICD-10-CM | POA: Diagnosis not present

## 2019-07-11 DIAGNOSIS — K521 Toxic gastroenteritis and colitis: Secondary | ICD-10-CM | POA: Diagnosis not present

## 2019-07-11 NOTE — Progress Notes (Signed)
Pharmacist Chemotherapy Monitoring - Follow Up Assessment    I verify that I have reviewed each item in the below checklist:  . Regimen for the patient is scheduled for the appropriate day and plan matches scheduled date. Marland Kitchen Appropriate non-routine labs are ordered dependent on drug ordered. . If applicable, additional medications reviewed and ordered per protocol based on lifetime cumulative doses and/or treatment regimen.   Plan for follow-up and/or issues identified: No . I-vent associated with next due treatment: Yes . MD and/or nursing notified: No   Kennith Center, Pharm.D., CPP 07/11/2019@3 :39 PM

## 2019-07-11 NOTE — Addendum Note (Signed)
Addended by: Tora Kindred on: 07/11/2019 03:37 PM   Modules accepted: Orders

## 2019-07-16 ENCOUNTER — Encounter: Payer: Self-pay | Admitting: Hematology and Oncology

## 2019-07-16 ENCOUNTER — Other Ambulatory Visit: Payer: Self-pay

## 2019-07-16 ENCOUNTER — Inpatient Hospital Stay: Payer: BC Managed Care – PPO

## 2019-07-16 ENCOUNTER — Inpatient Hospital Stay (HOSPITAL_BASED_OUTPATIENT_CLINIC_OR_DEPARTMENT_OTHER): Payer: BC Managed Care – PPO | Admitting: Hematology and Oncology

## 2019-07-16 DIAGNOSIS — R109 Unspecified abdominal pain: Secondary | ICD-10-CM

## 2019-07-16 DIAGNOSIS — Z5111 Encounter for antineoplastic chemotherapy: Secondary | ICD-10-CM | POA: Diagnosis not present

## 2019-07-16 DIAGNOSIS — C541 Malignant neoplasm of endometrium: Secondary | ICD-10-CM

## 2019-07-16 DIAGNOSIS — T451X5A Adverse effect of antineoplastic and immunosuppressive drugs, initial encounter: Secondary | ICD-10-CM

## 2019-07-16 DIAGNOSIS — C252 Malignant neoplasm of tail of pancreas: Secondary | ICD-10-CM

## 2019-07-16 DIAGNOSIS — R110 Nausea: Secondary | ICD-10-CM | POA: Diagnosis not present

## 2019-07-16 DIAGNOSIS — G620 Drug-induced polyneuropathy: Secondary | ICD-10-CM | POA: Diagnosis not present

## 2019-07-16 DIAGNOSIS — Z5189 Encounter for other specified aftercare: Secondary | ICD-10-CM | POA: Diagnosis not present

## 2019-07-16 DIAGNOSIS — C7961 Secondary malignant neoplasm of right ovary: Secondary | ICD-10-CM

## 2019-07-16 DIAGNOSIS — E860 Dehydration: Secondary | ICD-10-CM | POA: Diagnosis not present

## 2019-07-16 DIAGNOSIS — Z7189 Other specified counseling: Secondary | ICD-10-CM

## 2019-07-16 LAB — CMP (CANCER CENTER ONLY)
ALT: 55 U/L — ABNORMAL HIGH (ref 0–44)
AST: 25 U/L (ref 15–41)
Albumin: 3.1 g/dL — ABNORMAL LOW (ref 3.5–5.0)
Alkaline Phosphatase: 103 U/L (ref 38–126)
Anion gap: 8 (ref 5–15)
BUN: 9 mg/dL (ref 6–20)
CO2: 29 mmol/L (ref 22–32)
Calcium: 8.9 mg/dL (ref 8.9–10.3)
Chloride: 104 mmol/L (ref 98–111)
Creatinine: 0.6 mg/dL (ref 0.44–1.00)
GFR, Est AFR Am: >60 mL/min (ref >60)
GFR, Estimated: >60 mL/min (ref >60)
Glucose, Bld: 111 mg/dL — ABNORMAL HIGH (ref 70–99)
Potassium: 3.9 mmol/L (ref 3.5–5.1)
Sodium: 141 mmol/L (ref 135–145)
Total Bilirubin: <0.2 mg/dL — ABNORMAL LOW (ref 0.3–1.2)
Total Protein: 6.3 g/dL — ABNORMAL LOW (ref 6.5–8.1)

## 2019-07-16 LAB — CBC WITH DIFFERENTIAL (CANCER CENTER ONLY)
Abs Immature Granulocytes: 0.3 10*3/uL — ABNORMAL HIGH (ref 0.00–0.07)
Basophils Absolute: 0.1 10*3/uL (ref 0.0–0.1)
Basophils Relative: 1 %
Eosinophils Absolute: 0.1 10*3/uL (ref 0.0–0.5)
Eosinophils Relative: 1 %
HCT: 34.4 % — ABNORMAL LOW (ref 36.0–46.0)
Hemoglobin: 10.7 g/dL — ABNORMAL LOW (ref 12.0–15.0)
Immature Granulocytes: 2 %
Lymphocytes Relative: 26 %
Lymphs Abs: 3.3 10*3/uL (ref 0.7–4.0)
MCH: 27.7 pg (ref 26.0–34.0)
MCHC: 31.1 g/dL (ref 30.0–36.0)
MCV: 89.1 fL (ref 80.0–100.0)
Monocytes Absolute: 1.8 10*3/uL — ABNORMAL HIGH (ref 0.1–1.0)
Monocytes Relative: 14 %
Neutro Abs: 7.2 10*3/uL (ref 1.7–7.7)
Neutrophils Relative %: 56 %
Platelet Count: 420 10*3/uL — ABNORMAL HIGH (ref 150–400)
RBC: 3.86 MIL/uL — ABNORMAL LOW (ref 3.87–5.11)
RDW: 17.8 % — ABNORMAL HIGH (ref 11.5–15.5)
WBC Count: 12.8 10*3/uL — ABNORMAL HIGH (ref 4.0–10.5)
nRBC: 0 % (ref 0.0–0.2)

## 2019-07-16 MED ORDER — SODIUM CHLORIDE 0.9% FLUSH
10.0000 mL | Freq: Once | INTRAVENOUS | Status: AC
  Administered 2019-07-16: 10 mL
  Filled 2019-07-16: qty 10

## 2019-07-16 NOTE — Patient Instructions (Signed)

## 2019-07-16 NOTE — Assessment & Plan Note (Signed)
She had less side effects with recent treatment  She has stable nausea, less abdominal pain, stable diarrhea as well as persistent neuropathy We will continue treatment as scheduled, for minimum of 4 cycles of treatment before repeat CT imaging She will continue advanced home care service with IV Zofran as needed for severe nausea and IVF

## 2019-07-16 NOTE — Assessment & Plan Note (Signed)
She has less nausea She will continue IV fluids and IV Zofran support at home

## 2019-07-16 NOTE — Assessment & Plan Note (Signed)
She has persistent neuropathy but not worse after recent treatment She will continue taking gabapentin as needed 

## 2019-07-16 NOTE — Progress Notes (Signed)
[place] OFFICE PROGRESS NOTE  Patient Care Team: [name], MD as PCP - General (Family Medicine)  ASSESSMENT & PLAN:  Pancreatic cancer [facility]) She had less side effects with recent treatment  She has stable nausea, less abdominal pain, stable diarrhea as well as persistent neuropathy We will continue treatment as scheduled, for minimum of 4 cycles of treatment before repeat CT imaging She will continue advanced home care service with IV Zofran as needed for severe nausea and IVF  Abdominal pain Her abdominal pain is due to cancer She is comfortable with current regimen We discussed narcotic refill policy  Peripheral neuropathy due to chemotherapy [facility]) She has persistent neuropathy but not worse after recent treatment She will continue taking gabapentin as needed  Chemotherapy-induced nausea She has less nausea She will continue IV fluids and IV Zofran support at home   No orders of the defined types were placed in this encounter.   All questions were answered. The patient knows to call the clinic with any problems, questions or concerns. The total time spent in the appointment was 20 minutes encounter with patients including review of chart and various tests results, discussions about plan of care and coordination of care plan   Heath Lark, MD 07/16/2019 10:58 AM  INTERVAL HISTORY: Please see below for problem oriented charting. 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 Mixed attenuation mass  emanates from the right adnexa measuring 10.8 x 8.0 cm very suspicious for right ovarian carcinoma. 2. Abnormality of the tail of the pancreas may be due to mild pancreatitis and small pseudocyst formation, but neoplasm cannot be excluded. 3. Small amount of ascites within abdomen and pelvis. 4. Small nonobstructing renal calculi bilaterally.    08/30/2017 Pathology Results   1. Uterus and cervix, with left fallopian tube - ENDOMETRIOID ADENOCARCINOMA, FIGO GRADE I, ARISING IN A BACKGROUND OF DIFFUSE COMPLEX ATYPICAL HYPERPLASIA. - CARCINOMA INVADES FOR OF DEPTH OF 0.2 CM WHERE THICKNESS OF MYOMETRIAL WALL IS 2.1 CM. - ALL RESECTION MARGINS ARE NEGATIVE FOR CARCINOMA. - NEGATIVE FOR LYMPHOVASCULAR OR PERINEURAL INVASION. - CERVICAL STROMA IS NOT INVOLVED. - SEE ONCOLOGY TABLE. - SEE NOTE 2. Ovary and fallopian tube, right - PRIMARY OVARIAN ENDOMETRIOID ADENOCARCINOMA, FIGO GRADE II, 12 CM. - THE OVARIAN SURFACE IS FOCALLY INVOLVED BY CARCINOMA. - NEGATIVE FOR LYMPHOVASCULAR INVASION. - BENIGN UNREMARKABLE FALLOPIAN TUBE, NEGATIVE FOR CARCINOMA. - SEE ONCOLOGY TABLE. - SEE NOTE 3. Cul-de-sac biopsy - METASTATIC ADENOCARCINOMA, MOST CONSISTENT WITH PRIMARY OVARIAN ENDOMETRIOID ADENOCARCINOMA. 4. Ovary, left - BENIGN UNREMARKABLE OVARY, NEGATIVE FOR MALIGNANCY. Microscopic Comment 1. UTERUS, CARCINOMA OR CARCINOSARCOMA Procedure: Total hysterectomy with bilateral salpingo-oophorectomy. Histologic type: Endometrioid adenocarcinoma. Histologic Grade: FIGO Grade I Myometrial invasion: Depth of invasion: 2 mm Myometrial thickness: 21 mm Uterine Serosa Involvement: Not identified Cervical stromal involvement: Not identified Extent of involvement of other organs: Not applicable Lymphovascular invasion: Not identified Regional Lymph Nodes: Examined: 0 Sentinel 0 Non-sentinel 0 Total Tumor block for ancillary studies: 1H MMR / MSI testing: Pending Pathologic Stage Classification  (pTNM, AJCC 8th edition): pT1a,  pNX (v4.1.0.0) 2. OVARY or FALLOPIAN TUBE or PRIMARY PERITONEUM: Procedure: Salpingo-oophorectomy Specimen Integrity: Intact Tumor Site: Right ovary Ovarian Surface Involvement (required only if applicable): Focally involved by carcinoma Fallopian Tube Surface Involvement (required only if applicable): Not identified Tumor Size: 12 cm Histologic Type: Endometrioid adenocarcinoma Histologic Grade: Grade II Implants (required for advanced stage serous/seromucinous borderline tumors only): Not applicable Other Tissue/ Organ Involvement: Cul de sac biopsy involved by tumor Largest Extrapelvic Peritoneal Focus (required only if applicable): Not applicable Peritoneal/Ascitic Fluid: Negative for carcinoma (case # [id number]) Treatment Effect (required only for high-grade serous carcinomas): Not applicable Regional Lymph Nodes: No lymph nodes submitted or found Number of Lymph Nodes Examined: 0 Pathologic Stage Classification (pTNM, AJCC 8th Edition): pT2b, pN0 Representative Tumor Block: 2B and 2E 1. Molecular study for microsatellite instability and immunohistochemical stains for MMR-related proteins are pending and will be reported in an addendum. 2. Immunohistochemical stain show that the ovarian tumor is positive for CK7 and PAX8 (both diffuse), CDX2 (patchy and weak); and negative for CK20. This immunoprofile is consistent with the above diagnosis. Dr. Lyndon Code has reviewed this case and concurs with the above diagnosis. Molecular study for microsatellite instability and immunohistochemical stains for MMR-related proteins are pending and will be reported in an addendum   08/30/2017 Genetic Testing   Patient has genetic testing done for MSI  Results revealed patient has the following mutation(s): loss of [facility] 2   08/30/2017 Surgery   Surgeon: Mart Piggs, MD Pre-operative Diagnosis:  1. Adnexal mass 2. Abnormal uterine bleeding 3. H/o Cecal  CA  Post-operative Diagnosis:  1. Adhesive disease post colon resection 2. Endometrial cancer NOS 3. Adenocarcinoma unknown origin, right ovary, suspicious for GI primary  Operation:  1. Lysis of adhesions ~30 minutes 2. Robotic-assisted laparoscopic total hysterectomy with right salpingo-oophorectomy and left salpingectomy 3. Left oophorectomy (RA-laparoscopic) 4. Pelvic washings  Findings: Adhesions of omentum to anterior abdominal wall. Enlarged cystic right ovary ~10cm. Uterus had small nodules on serosa near where right adnexa was intimate with the surface. No obvious intraoperative rupture of cyst, although in 2 areas the wall was thin and one of these areas had some bleeding. Slight scarring of left bladder dome to LUS/cervix. Uterus on frozen section c/w hyperplasia and a small focus of endometrial CA - no myo invasion, <2cm in size. Frozen section on the right adnexa was carcinoma, met from colon or possibly Gyn, favor GI primary, defer to permanent. Left ovary was WNL.     09/15/2017 Cancer Staging   Staging form: Corpus Uteri - Carcinoma and Carcinosarcoma, AJCC 8th Edition - Pathologic: FIGO Stage IA (pT1a, pN0, cM0) - Signed by Heath Lark, MD on 09/15/2017   09/26/2017 Procedure   Successful placement of a right internal jugular approach power injectable Port-A-Cath. The catheter is ready for immediate use.   11/07/2017 Imaging   1. Since 08/18/2017, similar to slight decrease in size of a pancreatic body/tail junction lesion. Cross modality comparison relative to 09/16/2017 MRI is also grossly similar. 2. No evidence of metastatic disease. 3. Aortic Atherosclerosis (ICD10-I70.0).  4. Left nephrolithiasis.   11/18/2017 Genetic Testing   MSH2 c.2237dupT pathogenic mutation identified in the CancerNext panel.  The CancerNext gene panel offered by [employer] includes sequencing and rearrangement analysis for the following 34 genes:   APC, ATM, BARD1, BMPR1A, BRCA1, BRCA2,  BRIP1, CDH1, CDK4, CDKN2A, CHEK2, DICER1, HOXB13, EPCAM, GREM1, MLH1, MRE11A, MSH2, MSH6, MUTYH, NBN, NF1, PALB2, PMS2, POLD1, POLE, PTEN, RAD50, RAD51C, RAD51D, SMAD4, SMARCA4,  STK11, and TP53.  The report date is November 18, 2017.  MSH2 [id number] pathogenic mutation identified on somatic testing.  These results are consistent with a diagnosis of Lynch syndrome.   06/18/2019 -  Chemotherapy   The patient had FOLFIRINOX for chemotherapy treatment.     Secondary malignant neoplasm of right ovary ([place])  08/23/2017 Tumor Marker   Patient's tumor was tested for the following markers: CA-125 Results of the tumor marker test revealed 139.8   08/31/2017 Initial Diagnosis   Secondary malignant neoplasm of right ovary ([place])   09/15/2017 Cancer Staging   Staging form: Ovary, Fallopian Tube, and Primary Peritoneal Carcinoma, AJCC 8th Edition - Pathologic: Stage IIB (pT2b, pN0, cM0) - Signed by Heath Lark, MD on 09/15/2017   09/27/2017 Imaging   No evidence of metastatic disease or other acute findings within the thorax.  4 cm low-attenuation mass in pancreatic tail, highly suspicious for pancreatic carcinoma. This is caused splenic vein thrombosis, with new venous collaterals in the left upper quadrant. Consider endoscopic ultrasound with FNA for tissue diagnosis.  Stable benign hepatic hemangioma.    09/27/2017 Tumor Marker   Patient's tumor was tested for the following markers: CA-125 Results of the tumor marker test revealed 39.4   11/02/2017 Tumor Marker   Patient's tumor was tested for the following markers: CA-125 Results of the tumor marker test revealed 19   11/07/2017 Tumor Marker   Patient's tumor was tested for the following markers: CA-125 Results of the tumor marker test revealed 18.3   11/18/2017 Genetic Testing   MSH2 c.2237dupT pathogenic mutation identified in the CancerNext panel.  The CancerNext gene panel offered by [employer] includes sequencing and  rearrangement analysis for the following 34 genes:   APC, ATM, BARD1, BMPR1A, BRCA1, BRCA2, BRIP1, CDH1, CDK4, CDKN2A, CHEK2, DICER1, HOXB13, EPCAM, GREM1, MLH1, MRE11A, MSH2, MSH6, MUTYH, NBN, NF1, PALB2, PMS2, POLD1, POLE, PTEN, RAD50, RAD51C, RAD51D, SMAD4, SMARCA4, STK11, and TP53.  The report date is November 18, 2017.  MSH2 [id number] pathogenic mutation identified on somatic testing.  These results are consistent with a diagnosis of Lynch syndrome.   12/15/2017 Tumor Marker   Patient's tumor was tested for the following markers: CA-125 Results of the tumor marker test revealed 57.3   01/16/2018 Tumor Marker   Patient's tumor was tested for the following markers: CA-125 Results of the tumor marker test revealed 24.2   04/10/2018 Tumor Marker   Patient's tumor was tested for the following markers: CA-125 Results of the tumor marker test revealed 18.2   07/12/2018 Tumor Marker   Patient's tumor was tested for the following markers: CA-125 Results of the tumor marker test revealed 11.8   10/16/2018 Tumor Marker   Patient's tumor was tested for the following markers: CA125 Results of the tumor marker test revealed 12.4.   Pancreatic cancer ([place])  10/13/2017 Pathology Results   Pancreas tail mass, endoscopic ultrasound-guided, fine needle aspiration II (smears and cell block): Adenocarcinoma   11/18/2017 Genetic Testing   MSH2 c.2237dupT pathogenic mutation identified in the CancerNext panel.  The CancerNext gene panel offered by [employer] includes sequencing and rearrangement analysis for the following 34 genes:   APC, ATM, BARD1, BMPR1A, BRCA1, BRCA2, BRIP1, CDH1, CDK4, CDKN2A, CHEK2, DICER1, HOXB13, EPCAM, GREM1, MLH1, MRE11A, MSH2, MSH6, MUTYH, NBN, NF1, PALB2, PMS2, POLD1, POLE, PTEN, RAD50, RAD51C, RAD51D, SMAD4, SMARCA4, STK11, and TP53.  The report date is November 18, 2017.  MSH2 [id number] pathogenic mutation identified on somatic  testing.  These results are  consistent with a diagnosis of Lynch syndrome.   11/24/2017 Pathology Results   A. "TAIL OF PANCREAS AND SPLEEN", DISTAL PANCREATECTOMY AND SPLENECTOMY: Invasive ductal adenocarcinoma, moderately to poorly differentiated with focal signet ring cell features, of pancreas (distal).   The carcinoma is 2.5 cm in greatest dimension grossly.   Treatment effects present in the form of fibrosis (50%). No lymphovascular or definite perineural invasion identified. All surgical margins are negative for tumor or high-grade dysplasia. Adjacent mucinous neoplasm, most consistent with Intraductal papillary mucinous neoplasm (IPMN) with low-grade dysplasia.   Uninvolved pancreas show atrophy and focal acute inflammation.   Twelve benign lymph nodes (0/12). Spleen with no significant histopathologic abnormalities.  PROCEDURE: distal pancreatectomy and splenectomy TUMOR SITE: distal pancreas TUMOR SIZE:  GREATEST DIMENSION: 2.5 cm  ADDITIONAL DIMENSIONS:  x  cm HISTOLOGIC TYPE: ductal adenocarcinoma HISTOLOGIC GRADE: grade 3 TUMOR EXTENSION: peripancreatic soft tissue MARGINS: negative for tumor TREATMENT EFFECT: treatment effects present in the form of fibrosis (50%). LYMPHOVASCULAR INVASION: not identified PERINEURAL INVASION: no definite evidence REGIONAL LYMPH NODES:   NUMBER OF LYMPH NODES INVOLVED: 0   NUMBER OF LYMPH NODES EXAMINED: 12 PATHOLOGIC STAGE CLASSIFICATION (pTNM, AJCC 8th Ed): ypT2, ypN0 DISTANT METASTASIS (pM): pMx ADDITIONAL PATHOLOGIC FINDINGS: mucinous neoplasm, most consistent with intraductal papillary mucinous neoplasm (IPMN) with low-grade dysplasia, is identified adjacent to the main tumor.   11/24/2017 Cancer Staging   Staging form: Exocrine Pancreas, AJCC 8th Edition - Pathologic stage from 11/24/2017: Stage IB (pT2, pN0, cM0) - Signed by Truitt Merle, MD on 01/03/2018   11/25/2017  Surgery   She had surgery at [facility] 1. Exploratory Laparotomy 2. Distal Pancreatectomy and Splenectomy 3. Intraoperative Ultrasound 4. Open Cholecystectomy    12/15/2017 Cancer Staging   Staging form: Exocrine Pancreas, AJCC 8th Edition - Clinical: Stage IB (cT2, cN0, cM0) - Signed by Heath Lark, MD on 12/15/2017   12/28/2017 Imaging   12/28/2017 CT Abdomen  IMPRESSION: 1. Postoperative findings from recent partial pancreatectomy including a 21 cubic cm fluid collection along the pancreatic resection margin which could represent early pseudocyst. 2. Nodularity along the lateral limb of the left adrenal gland could also be postoperative but merit surveillance, as the pancreatic lesion was in close proximity to this adrenal gland on the prior CT of 11/07/2017. 3. Asymmetric fullness inferiorly in the left breast. The patient has a history of prior left breast procedures, correlation with mammography is recommended. 4. Other imaging findings of potential clinical significance: Stable hemangioma in the left hepatic lobe. Splenectomy. Aortic Atherosclerosis (ICD10-I70.0). Right hemicolectomy. Small focus of fat necrosis in the right anterior abdominal wall subcutaneous tissues near the laparotomy site. Bilateral nonobstructive nephrolithiasis.   01/02/2018 Tumor Marker   Patient's tumor was tested for the following markers: CA-19-9 Results of the tumor marker test revealed 5   01/03/2018 - 06/05/2018 Chemotherapy   She received modified dose FOLFIRINOX   04/10/2018 Imaging   1. Distal pancreatectomy, without findings of recurrent or metastatic disease. 2. Incompletely imaged hypoenhancement within lower lobe right pulmonary artery branch is likely chronic (but interval since 10/09/2017) pulmonary embolism. Dedicated CTA could further evaluate. 3. Decrease in size of a peripancreatic complex fluid collection anteriorly, likely a resolving pseudocyst. 4. Decreased size of minimal fluid  versus a borderline sized node in the gastrohepatic ligament. Recommend attention on follow-up. 5. Hepatic steatosis with a segment 4 hemangioma. 6. Aortic Atherosclerosis (ICD10-I70.0). This is significantly age advanced.   07/12/2018 Tumor Marker   Patient's tumor was tested for the following  markers: CA-19-9 Results of the tumor marker test revealed 6   07/12/2018 Imaging   1. Status post distal pancreatectomy with stable postoperative fluid collection adjacent to the ventral aspect of the pancreatic head. No findings to suggest metastatic disease in the abdomen or pelvis. 2. Hepatic steatosis with small cavernous hemangioma in segment 4A of the liver. 3. Slight decreased size of nonenlarged gastrohepatic ligament lymph node, presumably benign. 4. Aortic atherosclerosis.    10/16/2018 Imaging   Status post distal pancreatectomy. Stable postoperative seroma along the surgical margin.   Status post hysterectomy and right oophorectomy.   Status post right hemicolectomy with appendectomy.   No evidence of recurrent or metastatic disease.   No colonic wall thickening or mass is evident on CT.   01/22/2019 Tumor Marker   Patient's tumor was tested for the following markers: CA-19.9 Results of the tumor marker test revealed 5   01/22/2019 Imaging   1. No evidence of local recurrence or metastatic disease status post distal pancreatectomy and splenectomy. 2. Stable small seroma anterior to the pancreatic head. 3. Postsurgical changes as described. 4. Stable additional incidental findings including a hepatic hemangioma, nonobstructing bilateral renal calculi and aortic Atherosclerosis (ICD10-I70.0).   04/18/2019 Imaging   1. Status post distal pancreatectomy and splenectomy. There has been a gradual increase in ill-defined soft tissue and celiac axis/SMA origin lymph nodes adjacent to the distal pancreatectomy margin and lesser curvature of the stomach/gastric body over sequential prior  examinations dated 01/22/2019 and 09/26/2018. An enlarged lymph node or soft tissue nodule adjacent to the SMA origin now measures 1.8 x 0.8 cm. Findings are concerning for local recurrence of pancreatic malignancy. 2. Separately from the above findings, there remains a 1.0 cm low-attenuation nodule anterior to the remnant pancreatic neck, most consistent with postoperative seroma 3. No evidence of distant metastatic disease in the chest, abdomen, or pelvis. 4. Postoperative findings of cholecystectomy, right hemicolectomy, and hysterectomy. 5. Bilateral nonobstructive nephrolithiasis. 6.  Aortic Atherosclerosis (ICD10-I70.0).       05/28/2019 Tumor Marker   Patient's tumor was tested for the following markers: CA19-9 Results of the tumor marker test revealed 18   05/28/2019 Imaging   1. Status post distal pancreatectomy with continued further progression of the ill-defined soft tissue to the left of the celiac axis/SMA origin, now measuring 1.9 x 1.9 cm and highly concerning for recurrent/metastatic disease. This process abuts both the celiac axis, SMA, and posterior wall of the mid stomach. PET-CT may prove helpful to further evaluate. 2. Bandlike ill-defined soft tissue in the anterior abdomen, potentially peritoneal or omental is similar to perhaps minimally increased qualitatively in the interval. Close attention on follow-up recommended. 3. Left paraumbilical hernia contains a short segment of small bowel without complicating features. 4. Ill-defined very subtle area of decreased attenuation in the posterior aspect of hepatic segment IV. This is likely focal fatty deposition. Close attention on follow-up recommended. 5.  Aortic Atherosclerois (ICD10-170.0)   06/07/2019 Pathology Results   Malignant cells consistent with adenocarcinoma   06/07/2019 Procedure   ENDOSCOPIC FINDING (limited views with linear echoendoscope): :      The examined esophagus was endoscopically normal.      The  entire examined stomach was endoscopically normal.      ENDOSONOGRAPHIC FINDING: :      1. An irregular mass was identified in the region of the pancreatic tail resection site. The mass was stellate with very poorly defined borders. Fine needle aspiration for cytology was performed. Color Doppler imaging  was utilized prior to needle puncture to confirm a lack of significant vascular structures within the needle path. Four passes were made with the 25 gauge (FNB) needle using a transgastric approach. A cytotechnologist was present to evaluate the adequacy of the specimen. Final cytology results are pending. Impression:               -  Irregular, stellate soft tissue mass in the region of the pancreatic tail resection site. This was sampled with four transgastric passes with an EUS FNB needle..   06/18/2019 -  Chemotherapy   The patient had FOLFIRINOX for chemotherapy treatment.       REVIEW OF SYSTEMS:   Constitutional: Denies fevers, chills or abnormal weight loss Eyes: Denies blurriness of vision Ears, nose, mouth, throat, and face: Denies mucositis or sore throat Respiratory: Denies cough, dyspnea or wheezes Cardiovascular: Denies palpitation, chest discomfort or lower extremity swelling Skin: Denies abnormal skin rashes Lymphatics: Denies new lymphadenopathy or easy bruising Behavioral/Psych: Mood is stable, no new changes  All other systems were reviewed with the patient and are negative.  I have reviewed the past medical history, past surgical history, social history and family history with the patient and they are unchanged from previous note.  ALLERGIES:  is allergic to codeine; penicillins; and bactrim [sulfamethoxazole-trimethoprim].  MEDICATIONS:  Current Outpatient Medications  Medication Sig Dispense Refill  . cetirizine (ZYRTEC) 10 MG tablet Take 10 mg by mouth daily.    . citalopram (CELEXA) 20 MG tablet TAKE 1 TABLET BY MOUTH EVERY DAY 90 tablet 4  . CREON 24000-76000  units CPEP Take 1 capsule by mouth See admin instructions. Take 1 capsule before each meal and 1 capsule after each meal, take 1 cap with snacks    . diclofenac Sodium (VOLTAREN) 1 % GEL Apply 2-4 grams to affected joint 4 times daily as needed. 400 g 1  . diphenoxylate-atropine (LOMOTIL) 2.5-0.025 MG tablet Take 1 tablet by mouth 4 (four) times daily as needed for diarrhea or loose stools. 60 tablet 0  . estradiol (ESTRACE VAGINAL) 0.1 MG/GM vaginal cream Place 1 Applicatorful vaginally 3 (three) times a week. 42.5 g 12  . fluticasone (FLONASE) 50 MCG/ACT nasal spray Place 1 spray into both nostrils daily.    . folic acid (FOLVITE) 1 MG tablet Take 2 tablets (2 mg total) by mouth daily. 180 tablet 3  . gabapentin (NEURONTIN) 300 MG capsule Take 1 capsule (300 mg total) by mouth 3 (three) times daily. 90 capsule 11  . HYDROmorphone (DILAUDID) 8 MG tablet Take 1 tablet (8 mg total) by mouth every 6 (six) hours as needed for severe pain. 90 tablet 0  . hydrOXYzine (ATARAX/VISTARIL) 10 MG tablet Take 1 tablet (10 mg total) by mouth 3 (three) times daily as needed. 30 tablet 1  . lidocaine-prilocaine (EMLA) cream Apply 1 application topically daily as needed (port). Apply to affected area once 30 g 11  . metFORMIN (GLUCOPHAGE) 500 MG tablet TAKE 1 TABLET (500 MG TOTAL) BY MOUTH 2 (TWO) TIMES DAILY WITH A MEAL. (Patient taking differently: Take 500 mg by mouth 2 (two) times daily with a meal. ) 60 tablet 1  . methadone (DOLOPHINE) 10 MG tablet Take 2 tablets (20 mg total) by mouth every 12 (twelve) hours. 60 tablet 0  . methocarbamol (ROBAXIN) 500 MG tablet TAKE 1 TABLET BY MOUTH 3 TIMES DAILY AS NEEDED FOR MUSCLE SPASMS. (Patient taking differently: Take 500 mg by mouth every 8 (eight) hours as needed for muscle  spasms. ) 90 tablet 0  . Multiple Vitamin (MULTIVITAMIN WITH MINERALS) TABS tablet Take 1 tablet by mouth daily.    Marland Kitchen omeprazole (PRILOSEC) 40 MG capsule Take 1 capsule (40 mg total) by mouth  daily. 30 capsule 11  . ondansetron (ZOFRAN) 8 MG tablet TAKE 1 TAB BY MOUTH EVERY 8HOURS AS NEEDED FOR REFRACTORY NAUSEA/VOMITING START ON DAY 3AFTER CHEMO 8 tablet 7  . predniSONE (DELTASONE) 1 MG tablet Take 4 tablets (4 mg total) by mouth daily with breakfast. 360 tablet 1  . prochlorperazine (COMPAZINE) 10 MG tablet Take 10 mg by mouth every 6 (six) hours as needed.    . rivaroxaban (XARELTO) 20 MG TABS tablet Take 1 tablet (20 mg total) by mouth daily with supper. 30 tablet 9  . simethicone (MYLICON) 448 MG chewable tablet Chew 125 mg by mouth every 6 (six) hours as needed for flatulence.    . traMADol (ULTRAM) 50 MG tablet Take 1 tablet (50 mg total) by mouth every 6 (six) hours as needed for moderate pain. 60 tablet 0   No current facility-administered medications for this visit.    PHYSICAL EXAMINATION: ECOG PERFORMANCE STATUS: 1 - Symptomatic but completely ambulatory  Vitals:   07/16/19 1029  BP: (!) 144/84  Pulse: 60  Resp: 16  Temp: 98.8 F (37.1 C)  SpO2: 100%   Filed Weights   07/16/19 1029  Weight: 172 lb (78 kg)    GENERAL:alert, no distress and comfortable SKIN: skin color, texture, turgor are normal, no rashes or significant lesions EYES: normal, Conjunctiva are pink and non-injected, sclera clear OROPHARYNX:no exudate, no erythema and lips, buccal mucosa, and tongue normal  NECK: supple, thyroid normal size, non-tender, without nodularity LYMPH:  no palpable lymphadenopathy in the cervical, axillary or inguinal LUNGS: clear to auscultation and percussion with normal breathing effort HEART: regular rate & rhythm and no murmurs and no lower extremity edema ABDOMEN:abdomen soft, non-tender and normal bowel sounds Musculoskeletal:no cyanosis of digits and no clubbing  NEURO: alert & oriented x 3 with fluent speech, no focal motor/sensory deficits  LABORATORY DATA:  I have reviewed the data as listed    Component Value Date/Time   NA 141 07/16/2019 0957   K  3.9 07/16/2019 0957   CL 104 07/16/2019 0957   CO2 29 07/16/2019 0957   GLUCOSE 111 (H) 07/16/2019 0957   BUN 9 07/16/2019 0957   CREATININE 0.60 07/16/2019 0957   CREATININE 0.68 07/27/2017 0851   CALCIUM 8.9 07/16/2019 0957   PROT 6.3 (L) 07/16/2019 0957   ALBUMIN 3.1 (L) 07/16/2019 0957   AST 25 07/16/2019 0957   ALT 55 (H) 07/16/2019 0957   ALKPHOS 103 07/16/2019 0957   BILITOT <0.2 (L) 07/16/2019 0957   GFRNONAA >60 07/16/2019 0957   GFRNONAA 106 07/27/2017 0851   GFRAA >60 07/16/2019 0957   GFRAA 123 07/27/2017 0851    No results found for: SPEP, UPEP  Lab Results  Component Value Date   WBC 12.8 (H) 07/16/2019   NEUTROABS 7.2 07/16/2019   HGB 10.7 (L) 07/16/2019   HCT 34.4 (L) 07/16/2019   MCV 89.1 07/16/2019   PLT 420 (H) 07/16/2019      Chemistry      Component Value Date/Time   NA 141 07/16/2019 0957   K 3.9 07/16/2019 0957   CL 104 07/16/2019 0957   CO2 29 07/16/2019 0957   BUN 9 07/16/2019 0957   CREATININE 0.60 07/16/2019 0957   CREATININE 0.68 07/27/2017 0851  Component Value Date/Time   CALCIUM 8.9 07/16/2019 0957   ALKPHOS 103 07/16/2019 0957   AST 25 07/16/2019 0957   ALT 55 (H) 07/16/2019 0957   BILITOT <0.2 (L) 07/16/2019 0957

## 2019-07-16 NOTE — Assessment & Plan Note (Signed)
Her abdominal pain is due to cancer She is comfortable with current regimen We discussed narcotic refill policy

## 2019-07-17 ENCOUNTER — Other Ambulatory Visit: Payer: Self-pay

## 2019-07-17 ENCOUNTER — Inpatient Hospital Stay: Payer: BC Managed Care – PPO

## 2019-07-17 VITALS — BP 137/83 | HR 65 | Temp 98.9°F | Resp 18

## 2019-07-17 DIAGNOSIS — C252 Malignant neoplasm of tail of pancreas: Secondary | ICD-10-CM

## 2019-07-17 DIAGNOSIS — Z7189 Other specified counseling: Secondary | ICD-10-CM

## 2019-07-17 DIAGNOSIS — Z5111 Encounter for antineoplastic chemotherapy: Secondary | ICD-10-CM | POA: Diagnosis not present

## 2019-07-17 DIAGNOSIS — C541 Malignant neoplasm of endometrium: Secondary | ICD-10-CM

## 2019-07-17 DIAGNOSIS — Z5189 Encounter for other specified aftercare: Secondary | ICD-10-CM | POA: Diagnosis not present

## 2019-07-17 MED ORDER — DIPHENHYDRAMINE HCL 50 MG/ML IJ SOLN
INTRAMUSCULAR | Status: AC
  Filled 2019-07-17: qty 1

## 2019-07-17 MED ORDER — SODIUM CHLORIDE 0.9 % IV SOLN
1960.0000 mg/m2 | INTRAVENOUS | Status: DC
  Administered 2019-07-17: 3650 mg via INTRAVENOUS
  Filled 2019-07-17: qty 73

## 2019-07-17 MED ORDER — ATROPINE SULFATE 0.4 MG/ML IJ SOLN
INTRAMUSCULAR | Status: AC
  Filled 2019-07-17: qty 1

## 2019-07-17 MED ORDER — ATROPINE SULFATE 1 MG/ML IJ SOLN
0.4000 mg | Freq: Once | INTRAMUSCULAR | Status: AC
  Administered 2019-07-17: 0.4 mg via INTRAVENOUS

## 2019-07-17 MED ORDER — DIPHENHYDRAMINE HCL 50 MG/ML IJ SOLN
50.0000 mg | Freq: Once | INTRAMUSCULAR | Status: AC
  Administered 2019-07-17: 50 mg via INTRAVENOUS

## 2019-07-17 MED ORDER — PALONOSETRON HCL INJECTION 0.25 MG/5ML
INTRAVENOUS | Status: AC
  Filled 2019-07-17: qty 5

## 2019-07-17 MED ORDER — DEXTROSE 5 % IV SOLN
Freq: Once | INTRAVENOUS | Status: AC
  Filled 2019-07-17: qty 250

## 2019-07-17 MED ORDER — SODIUM CHLORIDE 0.9 % IV SOLN
110.0000 mg/m2 | Freq: Once | INTRAVENOUS | Status: AC
  Administered 2019-07-17: 200 mg via INTRAVENOUS
  Filled 2019-07-17: qty 10

## 2019-07-17 MED ORDER — SODIUM CHLORIDE 0.9% FLUSH
10.0000 mL | INTRAVENOUS | Status: DC | PRN
  Administered 2019-07-17: 10 mL
  Filled 2019-07-17: qty 10

## 2019-07-17 MED ORDER — SODIUM CHLORIDE 0.9 % IV SOLN
10.0000 mg | Freq: Once | INTRAVENOUS | Status: AC
  Administered 2019-07-17: 10 mg via INTRAVENOUS
  Filled 2019-07-17: qty 10

## 2019-07-17 MED ORDER — OXALIPLATIN CHEMO INJECTION 100 MG/20ML
42.5000 mg/m2 | Freq: Once | INTRAVENOUS | Status: AC
  Administered 2019-07-17: 80 mg via INTRAVENOUS
  Filled 2019-07-17: qty 16

## 2019-07-17 MED ORDER — HEPARIN SOD (PORK) LOCK FLUSH 100 UNIT/ML IV SOLN
500.0000 [IU] | Freq: Once | INTRAVENOUS | Status: DC | PRN
  Filled 2019-07-17: qty 5

## 2019-07-17 MED ORDER — FAMOTIDINE IN NACL 20-0.9 MG/50ML-% IV SOLN
INTRAVENOUS | Status: AC
  Filled 2019-07-17: qty 50

## 2019-07-17 MED ORDER — SODIUM CHLORIDE 0.9 % IV SOLN
400.0000 mg/m2 | Freq: Once | INTRAVENOUS | Status: AC
  Administered 2019-07-17: 744 mg via INTRAVENOUS
  Filled 2019-07-17: qty 37.2

## 2019-07-17 MED ORDER — FAMOTIDINE IN NACL 20-0.9 MG/50ML-% IV SOLN
20.0000 mg | Freq: Once | INTRAVENOUS | Status: AC
  Administered 2019-07-17: 20 mg via INTRAVENOUS

## 2019-07-17 MED ORDER — PALONOSETRON HCL INJECTION 0.25 MG/5ML
0.2500 mg | Freq: Once | INTRAVENOUS | Status: AC
  Administered 2019-07-17: 0.25 mg via INTRAVENOUS

## 2019-07-17 MED ORDER — SODIUM CHLORIDE 0.9 % IV SOLN
150.0000 mg | Freq: Once | INTRAVENOUS | Status: AC
  Administered 2019-07-17: 150 mg via INTRAVENOUS
  Filled 2019-07-17: qty 150

## 2019-07-17 NOTE — Patient Instructions (Signed)
[facility] Discharge Instructions for Patients Receiving Chemotherapy  Today you received the following chemotherapy agents Oxaliplatin, Irinotecan, Leucovorin and 5-fu.  To help prevent nausea and vomiting after your treatment, we encourage you to take your nausea medication as directed   If you develop nausea and vomiting that is not controlled by your nausea medication, call the clinic.   BELOW ARE SYMPTOMS THAT SHOULD BE REPORTED IMMEDIATELY:  *FEVER GREATER THAN 100.5 F  *CHILLS WITH OR WITHOUT FEVER  NAUSEA AND VOMITING THAT IS NOT CONTROLLED WITH YOUR NAUSEA MEDICATION  *UNUSUAL SHORTNESS OF BREATH  *UNUSUAL BRUISING OR BLEEDING  TENDERNESS IN MOUTH AND THROAT WITH OR WITHOUT PRESENCE OF ULCERS  *URINARY PROBLEMS  *BOWEL PROBLEMS  UNUSUAL RASH Items with * indicate a potential emergency and should be followed up as soon as possible.  Feel free to call the clinic should you have any questions or concerns. The clinic phone number is [phone].  Please show the [place] at check-in to the Emergency Department and triage nurse.

## 2019-07-18 DIAGNOSIS — E860 Dehydration: Secondary | ICD-10-CM | POA: Diagnosis not present

## 2019-07-18 DIAGNOSIS — C252 Malignant neoplasm of tail of pancreas: Secondary | ICD-10-CM | POA: Diagnosis not present

## 2019-07-19 ENCOUNTER — Other Ambulatory Visit: Payer: Self-pay

## 2019-07-19 ENCOUNTER — Inpatient Hospital Stay: Payer: BC Managed Care – PPO

## 2019-07-19 VITALS — BP 116/78 | Temp 98.6°F | Resp 18

## 2019-07-19 DIAGNOSIS — C252 Malignant neoplasm of tail of pancreas: Secondary | ICD-10-CM | POA: Diagnosis not present

## 2019-07-19 DIAGNOSIS — C541 Malignant neoplasm of endometrium: Secondary | ICD-10-CM

## 2019-07-19 DIAGNOSIS — Z7189 Other specified counseling: Secondary | ICD-10-CM

## 2019-07-19 DIAGNOSIS — Z5111 Encounter for antineoplastic chemotherapy: Secondary | ICD-10-CM | POA: Diagnosis not present

## 2019-07-19 DIAGNOSIS — Z5189 Encounter for other specified aftercare: Secondary | ICD-10-CM | POA: Diagnosis not present

## 2019-07-19 MED ORDER — SODIUM CHLORIDE 0.9% FLUSH
10.0000 mL | INTRAVENOUS | Status: DC | PRN
  Administered 2019-07-19: 10 mL
  Filled 2019-07-19: qty 10

## 2019-07-19 MED ORDER — PEGFILGRASTIM-CBQV 6 MG/0.6ML ~~LOC~~ SOSY
PREFILLED_SYRINGE | SUBCUTANEOUS | Status: AC
  Filled 2019-07-19: qty 0.6

## 2019-07-19 MED ORDER — PEGFILGRASTIM-CBQV 6 MG/0.6ML ~~LOC~~ SOSY
6.0000 mg | PREFILLED_SYRINGE | Freq: Once | SUBCUTANEOUS | Status: AC
  Administered 2019-07-19: 6 mg via SUBCUTANEOUS

## 2019-07-19 MED ORDER — HEPARIN SOD (PORK) LOCK FLUSH 100 UNIT/ML IV SOLN
500.0000 [IU] | Freq: Once | INTRAVENOUS | Status: AC | PRN
  Administered 2019-07-19: 500 [IU]
  Filled 2019-07-19: qty 5

## 2019-07-19 NOTE — Patient Instructions (Signed)

## 2019-07-20 DIAGNOSIS — E860 Dehydration: Secondary | ICD-10-CM | POA: Diagnosis not present

## 2019-07-20 MED ORDER — PEGFILGRASTIM-JMDB 6 MG/0.6ML ~~LOC~~ SOSY
PREFILLED_SYRINGE | SUBCUTANEOUS | Status: AC
  Filled 2019-07-20: qty 0.6

## 2019-07-23 DIAGNOSIS — K521 Toxic gastroenteritis and colitis: Secondary | ICD-10-CM | POA: Diagnosis not present

## 2019-07-23 DIAGNOSIS — C259 Malignant neoplasm of pancreas, unspecified: Secondary | ICD-10-CM | POA: Diagnosis not present

## 2019-07-23 DIAGNOSIS — E860 Dehydration: Secondary | ICD-10-CM | POA: Diagnosis not present

## 2019-07-25 DIAGNOSIS — E860 Dehydration: Secondary | ICD-10-CM | POA: Diagnosis not present

## 2019-07-27 DIAGNOSIS — E860 Dehydration: Secondary | ICD-10-CM | POA: Diagnosis not present

## 2019-07-30 ENCOUNTER — Other Ambulatory Visit: Payer: Self-pay | Admitting: Emergency Medicine

## 2019-07-30 ENCOUNTER — Other Ambulatory Visit: Payer: Self-pay

## 2019-07-30 ENCOUNTER — Inpatient Hospital Stay: Payer: BC Managed Care – PPO | Attending: Hematology and Oncology

## 2019-07-30 ENCOUNTER — Encounter: Payer: Self-pay | Admitting: Hematology and Oncology

## 2019-07-30 ENCOUNTER — Telehealth: Payer: Self-pay

## 2019-07-30 ENCOUNTER — Inpatient Hospital Stay (HOSPITAL_BASED_OUTPATIENT_CLINIC_OR_DEPARTMENT_OTHER): Payer: BC Managed Care – PPO | Admitting: Medical

## 2019-07-30 VITALS — BP 125/89 | HR 96 | Temp 97.7°F | Resp 18 | Ht 62.0 in | Wt 172.6 lb

## 2019-07-30 DIAGNOSIS — C252 Malignant neoplasm of tail of pancreas: Secondary | ICD-10-CM

## 2019-07-30 DIAGNOSIS — R197 Diarrhea, unspecified: Secondary | ICD-10-CM | POA: Insufficient documentation

## 2019-07-30 DIAGNOSIS — C7961 Secondary malignant neoplasm of right ovary: Secondary | ICD-10-CM | POA: Insufficient documentation

## 2019-07-30 DIAGNOSIS — E119 Type 2 diabetes mellitus without complications: Secondary | ICD-10-CM | POA: Insufficient documentation

## 2019-07-30 DIAGNOSIS — C541 Malignant neoplasm of endometrium: Secondary | ICD-10-CM | POA: Diagnosis not present

## 2019-07-30 DIAGNOSIS — G620 Drug-induced polyneuropathy: Secondary | ICD-10-CM | POA: Diagnosis not present

## 2019-07-30 DIAGNOSIS — Z5189 Encounter for other specified aftercare: Secondary | ICD-10-CM | POA: Insufficient documentation

## 2019-07-30 DIAGNOSIS — R3589 Other polyuria: Secondary | ICD-10-CM

## 2019-07-30 DIAGNOSIS — J328 Other chronic sinusitis: Secondary | ICD-10-CM | POA: Diagnosis not present

## 2019-07-30 DIAGNOSIS — I700 Atherosclerosis of aorta: Secondary | ICD-10-CM | POA: Insufficient documentation

## 2019-07-30 DIAGNOSIS — G893 Neoplasm related pain (acute) (chronic): Secondary | ICD-10-CM | POA: Insufficient documentation

## 2019-07-30 DIAGNOSIS — Z5111 Encounter for antineoplastic chemotherapy: Secondary | ICD-10-CM | POA: Diagnosis not present

## 2019-07-30 DIAGNOSIS — R509 Fever, unspecified: Secondary | ICD-10-CM | POA: Diagnosis not present

## 2019-07-30 DIAGNOSIS — E860 Dehydration: Secondary | ICD-10-CM | POA: Diagnosis not present

## 2019-07-30 LAB — CBC WITH DIFFERENTIAL (CANCER CENTER ONLY)
Abs Immature Granulocytes: 2.19 10*3/uL — ABNORMAL HIGH (ref 0.00–0.07)
Basophils Absolute: 0.2 10*3/uL — ABNORMAL HIGH (ref 0.0–0.1)
Basophils Relative: 1 %
Eosinophils Absolute: 0.2 10*3/uL (ref 0.0–0.5)
Eosinophils Relative: 1 %
HCT: 36.9 % (ref 36.0–46.0)
Hemoglobin: 11.5 g/dL — ABNORMAL LOW (ref 12.0–15.0)
Immature Granulocytes: 8 %
Lymphocytes Relative: 12 %
Lymphs Abs: 3.4 10*3/uL (ref 0.7–4.0)
MCH: 28.1 pg (ref 26.0–34.0)
MCHC: 31.2 g/dL (ref 30.0–36.0)
MCV: 90.2 fL (ref 80.0–100.0)
Monocytes Absolute: 2.6 10*3/uL — ABNORMAL HIGH (ref 0.1–1.0)
Monocytes Relative: 9 %
Neutro Abs: 20.2 10*3/uL — ABNORMAL HIGH (ref 1.7–7.7)
Neutrophils Relative %: 69 %
Platelet Count: 501 10*3/uL — ABNORMAL HIGH (ref 150–400)
RBC: 4.09 MIL/uL (ref 3.87–5.11)
RDW: 20.1 % — ABNORMAL HIGH (ref 11.5–15.5)
WBC Count: 28.8 10*3/uL — ABNORMAL HIGH (ref 4.0–10.5)
nRBC: 0.4 % — ABNORMAL HIGH (ref 0.0–0.2)

## 2019-07-30 LAB — URINALYSIS, COMPLETE (UACMP) WITH MICROSCOPIC
Bilirubin Urine: NEGATIVE
Glucose, UA: NEGATIVE mg/dL
Hgb urine dipstick: NEGATIVE
Ketones, ur: NEGATIVE mg/dL
Leukocytes,Ua: NEGATIVE
Nitrite: NEGATIVE
Protein, ur: NEGATIVE mg/dL
Specific Gravity, Urine: 1.018 (ref 1.005–1.030)
pH: 6 (ref 5.0–8.0)

## 2019-07-30 MED ORDER — AMOXICILLIN-POT CLAVULANATE 875-125 MG PO TABS: 1.0000 | ORAL_TABLET | Freq: Two times a day (BID) | ORAL | 0 refills | Status: DC

## 2019-07-30 NOTE — Patient Instructions (Signed)

## 2019-07-30 NOTE — Progress Notes (Signed)
[facility]pt[facility] Progress Note   Dana Hicks [id number] Oct 13, 1975 44 y.o.  Dana Hicks is managed by Dr. Heath Lark  Actively treated with chemotherapy/immunotherapy/hormonal therapy: yes  Current therapy: FOLFIRINOX   Last treated: 07/17/2019   Next scheduled appointment with provider: 08/13/2019  Assessment: Plan:    Malignant neoplasm of tail of pancreas ([place])  Other chronic sinusitis - Plan: amoxicillin-clavulanate (AUGMENTIN) 875-125 MG tablet   Malignant neoplasm of the tail of pancreas: The patient continues to be managed by Dr. Heath Lark and is status post her most recent treatment with FOLFIRINOX on 07/17/2019.  She is scheduled to return for follow-up on 08/13/2019.  Sinusitis: Dana Hicks was placed on Augmentin 875-125 p.o. BID x 7 days  Please see After Visit Summary for patient specific instructions.  Future Appointments  Date Time Provider [place]  08/13/2019  9:15 AM [facility] LAB 4 [facility] None  08/13/2019  9:30 AM [facility] [place] FLUSH [facility] None  08/13/2019 10:00 AM Heath Lark, MD [facility] None  08/14/2019 10:45 AM [facility] INFUSION [facility] None  08/16/2019  2:45 PM [facility] [place] FLUSH [facility] None    No orders of the defined types were placed in this encounter.      Subjective:   Patient ID:  Dana Hicks is a 44 y.o. (DOB [birth year]) female.  Chief Complaint:  Chief Complaint  Patient presents with  . Fever  . Facial Pain    Sinus  . Polyuria    HPI Dana Hicks  is a 44 y.o. female with a diagnosis of a malignant neoplasm of the tail of pancreas: The patient continues to be managed by Dr. Heath Lark and is status post her most recent treatment with FOLFIRINOX on 07/17/2019.  She presents today with a stuffy nose, sinus pressure and pain, postnasal drainage, and intermittent fevers for 1 to 2 weeks. She additionally reports frequent urination but no dysuria or flank pain. She denies  nausea, vomiting, diarrhea, chills,   Medications: I have reviewed the patient's current medications.  Allergies:  Allergies  Allergen Reactions  . Codeine Other (See Comments)    headaches  . Penicillins     Severe headaches - has tolerated amoxicillin Has patient had a PCN reaction causing immediate rash, facial/tongue/throat swelling, SOB or lightheadedness with hypotension: No Has patient had a PCN reaction causing severe rash involving mucus membranes or skin necrosis: No Has patient had a PCN reaction that required hospitalization: No Has patient had a PCN reaction occurring within the last 10 years: No If all of the above answers are "NO", then may proceed with Cephalosporin use.   . Bactrim [Sulfamethoxazole-Trimethoprim] Rash    Past Medical History:  Diagnosis Date  . Allergic rhinitis, seasonal   . 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 Depression   . Diabetes mellitus without complication ([place])   . Endometrial cancer ([place]) 08/2017   Stage IA, Grade 1  . GERD (gastroesophageal reflux disease)   . History of MRSA infection 01/2017   followed by infectious disease center--  recurrent pustular folliculitis  . Nephrolithiasis    bilateral nonobstructive calculi per CT 08-18-2017  . OA (osteoarthritis)   . Ovarian cancer, right ([place]) 08/2017   Stage II Grade 2 Endometrioid  . Pancreatic cancer ([place])   . Rheumatoid arthritis [facility])    rheumatologist-  dr devenswar-- treated w/ oral prednisone daily and methotrexate injection every 3 wks  Past Surgical History:  Procedure Laterality Date  . BREAST LUMPECTOMY WITH RADIOACTIVE SEED LOCALIZATION Left 06/28/2014   Benign Procedure: RADIOACTIVE SEED LOCALIZATION LEFT BREAST LUMPECTOMY;  Surgeon: Excell Seltzer, MD;  Location: [place];  Service: General;  Laterality: Left;  . COLONOSCOPY  06/19/14  . ESOPHAGOGASTRODUODENOSCOPY (EGD) WITH  PROPOFOL N/A 06/07/2019   Procedure: ESOPHAGOGASTRODUODENOSCOPY (EGD) WITH PROPOFOL;  Surgeon: [name], MD;  Location[facility];  Service: Endoscopy;  Laterality: N/A;  . EUS N/A 06/07/2019   Procedure: UPPER ENDOSCOPIC ULTRASOUND (EUS) LINEAR;  Surgeon: [name], MD;  Location[facility];  Service: Endoscopy;  Laterality: N/A;  . EYE SURGERY Left    plug in tear duct  . FINE NEEDLE ASPIRATION N/A 06/07/2019   Procedure: FINE NEEDLE ASPIRATION (FNA) LINEAR;  Surgeon: [name], MD;  [facility];  Service: Endoscopy;  Laterality: N/A;  . IR IMAGING GUIDED PORT INSERTION  09/26/2017  . KNEE ARTHROSCOPY    . PANCREATECTOMY  11/25/2017  . PARTIAL KNEE ARTHROPLASTY Left 11/27/2018   Procedure: UNICOMPARTMENTAL KNEE;  Surgeon: Vickey Huger, MD;  Location[facility];  Service: Orthopedics;  Laterality: Left;  . PORT A CATH REVISION  2001   in and out  . RIGHT COLECTOMY  06/02/2009   cecum cancer  . ROBOTIC ASSISTED TOTAL HYSTERECTOMY WITH BILATERAL SALPINGO OOPHERECTOMY Bilateral 08/30/2017   Procedure: XI ROBOTIC ASSISTED TOTAL  HYSTERECTOMY BILATERAL  SALPINGO OOPHORECTOMY; LYSIS OF ADHESIONS;  Surgeon: Isabel Caprice, MD;  Location: [facility];  Service: Gynecology;  Laterality: Bilateral;  . SPLENECTOMY, PARTIAL  11/25/2017    Family History  Problem Relation Age of Onset  . Arthritis/Rheumatoid Mother   . Uterine cancer Mother 48  . Allergic rhinitis Father   . Heart disease Father   . Drug abuse Son   . Other Maternal Aunt        Unknown GYN CA  . Colon cancer Maternal Uncle 52  . Rectal cancer Maternal Uncle   . Diabetes Maternal Grandmother   . Cancer Maternal Grandfather        d. 35s of liver/lung cancer  . Angioedema Neg Hx   . Asthma Neg Hx   . Atopy Neg Hx   . Eczema Neg Hx   . Immunodeficiency Neg Hx   . Urticaria Neg Hx   . Esophageal cancer Neg Hx   . Stomach cancer Neg Hx     Social History   Socioeconomic History  . Marital  status: Married    Spouse name: Quita Skye  . Number of children: 2  . Years of education: Not on file  . Highest education level: Not on file  Occupational History  . Occupation: Futures trader  Tobacco Use  . Smoking status: Former Smoker    Packs/day: 1.00    Years: 5.00    Pack years: 5.00    Types: Cigarettes    Quit date: 02/05/1998    Years since quitting: 21.4  . Smokeless tobacco: Never Used  Substance and Sexual Activity  . Alcohol use: Not Currently  . Drug use: No  . Sexual activity: Not Currently  Other Topics Concern  . Not on file  Social History Narrative  . Not on file   Social Determinants of Health   Financial Resource Strain:   . Difficulty of Paying Living Expenses:   Food Insecurity:   . Worried About Charity fundraiser in the Last Year:   . [place] in the  Last Year:   Transportation Needs:   . Film/video editor (Medical):   Marland Kitchen Lack of Transportation (Non-Medical):   Physical Activity:   . Days of Exercise per Week:   . Minutes of Exercise per Session:   Stress:   . Feeling of Stress :   Social Connections:   . Frequency of Communication with Friends and Family:   . Frequency of Social Gatherings with Friends and Family:   . Attends Religious Services:   . Active Member of Clubs or Organizations:   . Attends Archivist Meetings:   Marland Kitchen Marital Status:   Intimate Partner Violence:   . Fear of Current or Ex-Partner:   . Emotionally Abused:   Marland Kitchen Physically Abused:   . Sexually Abused:     Past Medical History, Surgical history, Social history, and Family history were reviewed and updated as appropriate.   Please see review of systems for further details on the patient's review from today.   Review of Systems:  Review of Systems  Constitutional: Negative for chills, diaphoresis, fatigue and fever.  HENT: Positive for congestion, postnasal drip, sinus pressure and sinus pain. Negative for facial swelling, rhinorrhea and  sore throat.   Respiratory: Negative for cough and shortness of breath.   Cardiovascular: Negative for chest pain and palpitations.  Gastrointestinal: Negative for nausea and vomiting.  Genitourinary: Positive for frequency. Negative for difficulty urinating and dysuria.  Neurological: Positive for headaches. Negative for dizziness.    Objective:   Physical Exam:  BP 125/89 (BP Location: Left Arm, Patient Position: Sitting)   Pulse 96   Temp 97.7 F (36.5 C) (Temporal)   Resp 18   Ht 5\' 2"  (1.575 m)   Wt 78.3 kg (172 lb 9.6 oz)   LMP 08/30/2017   SpO2 97%   BMI 31.57 kg/m  ECOG: 0  Physical Exam Constitutional:      General: She is not in acute distress.    Appearance: She is not diaphoretic.  HENT:     Head: Normocephalic and atraumatic.     Nose:     Right Sinus: Frontal sinus tenderness present. No maxillary sinus tenderness.     Left Sinus: Frontal sinus tenderness present. No maxillary sinus tenderness.     Mouth/Throat:     Pharynx: No oropharyngeal exudate.  Cardiovascular:     Rate and Rhythm: Normal rate and regular rhythm.     Heart sounds: Normal heart sounds. No murmur. No friction rub. No gallop.   Pulmonary:     Effort: Pulmonary effort is normal. No respiratory distress.     Breath sounds: Normal breath sounds. No wheezing or rales.  Skin:    General: Skin is warm and dry.     Findings: No erythema or rash.  Neurological:     Mental Status: She is alert.     Lab Review:     Component Value Date/Time   NA 141 07/16/2019 0957   K 3.9 07/16/2019 0957   CL 104 07/16/2019 0957   CO2 29 07/16/2019 0957   GLUCOSE 111 (H) 07/16/2019 0957   BUN 9 07/16/2019 0957   CREATININE 0.60 07/16/2019 0957   CREATININE 0.68 07/27/2017 0851   CALCIUM 8.9 07/16/2019 0957   PROT 6.3 (L) 07/16/2019 0957   ALBUMIN 3.1 (L) 07/16/2019 0957   AST 25 07/16/2019 0957   ALT 55 (H) 07/16/2019 0957   ALKPHOS 103 07/16/2019 0957   BILITOT <0.2 (L) 07/16/2019 0957    GFRNONAA >60 07/16/2019  0957   GFRNONAA 106 07/27/2017 0851   GFRAA >60 07/16/2019 0957   GFRAA 123 07/27/2017 0851       Component Value Date/Time   WBC 28.8 (H) 07/30/2019 1111   WBC 11.6 (H) 05/28/2019 1212   RBC 4.09 07/30/2019 1111   HGB 11.5 (L) 07/30/2019 1111   HGB 13.0 05/15/2009 1616   HCT 36.9 07/30/2019 1111   HCT 38.0 05/15/2009 1616   PLT 501 (H) 07/30/2019 1111   PLT 215 05/15/2009 1616   MCV 90.2 07/30/2019 1111   MCV 92.7 05/15/2009 1616   MCH 28.1 07/30/2019 1111   MCHC 31.2 07/30/2019 1111   RDW 20.1 (H) 07/30/2019 1111   RDW 13.6 05/15/2009 1616   LYMPHSABS 3.4 07/30/2019 1111   LYMPHSABS 2.2 05/15/2009 1616   MONOABS 2.6 (H) 07/30/2019 1111   MONOABS 0.5 05/15/2009 1616   EOSABS 0.2 07/30/2019 1111   EOSABS 0.1 05/15/2009 1616   BASOSABS 0.2 (H) 07/30/2019 1111   BASOSABS 0.0 05/15/2009 1616   -------------------------------  Imaging from last 24 hours (if applicable):  Radiology interpretation: No results found.

## 2019-07-30 NOTE — Telephone Encounter (Signed)
Called regarding my chart message below.  " Good morning. I called and left a message for your nurse to call me. But I wanted to send a message incase you didn't get it.  I have been running a fever. I had one after chemo on Sunday, Monday, Tuesday, and then it stopped. It came back this Saturday, Sunday and again today. I have a stuffy nose so I'm wondering if it's a sinus infection. The only other symptoms I have is frequent urination yesterday. I have had sinus pressure and a headache. Which is why I feel sure it's a sinus infection. Please let me know your thoughts. I need to get past this before my vacation next week."  Scheduled appt with Dana Mealy, PA for today with labs prior. Ordered CBC per Dana Mealy, PA. Denies urinary symptoms. She is aware of times.

## 2019-07-31 LAB — URINE CULTURE: Culture: <10000 — AB

## 2019-08-01 DIAGNOSIS — E860 Dehydration: Secondary | ICD-10-CM | POA: Diagnosis not present

## 2019-08-03 DIAGNOSIS — E860 Dehydration: Secondary | ICD-10-CM | POA: Diagnosis not present

## 2019-08-06 ENCOUNTER — Other Ambulatory Visit: Payer: Self-pay | Admitting: Medical

## 2019-08-06 DIAGNOSIS — E860 Dehydration: Secondary | ICD-10-CM | POA: Diagnosis not present

## 2019-08-06 MED ORDER — CEFIXIME 400 MG PO CAPS: 400.0000 mg | ORAL_CAPSULE | Freq: Every day | ORAL | 0 refills | Status: DC

## 2019-08-13 ENCOUNTER — Inpatient Hospital Stay: Payer: BC Managed Care – PPO

## 2019-08-13 ENCOUNTER — Other Ambulatory Visit: Payer: Self-pay

## 2019-08-13 ENCOUNTER — Encounter: Payer: Self-pay | Admitting: Hematology and Oncology

## 2019-08-13 ENCOUNTER — Inpatient Hospital Stay (HOSPITAL_BASED_OUTPATIENT_CLINIC_OR_DEPARTMENT_OTHER): Payer: BC Managed Care – PPO | Admitting: Hematology and Oncology

## 2019-08-13 VITALS — BP 130/78 | HR 74 | Temp 98.6°F | Resp 18 | Ht 62.0 in | Wt 175.0 lb

## 2019-08-13 DIAGNOSIS — C541 Malignant neoplasm of endometrium: Secondary | ICD-10-CM

## 2019-08-13 DIAGNOSIS — C7961 Secondary malignant neoplasm of right ovary: Secondary | ICD-10-CM

## 2019-08-13 DIAGNOSIS — Z5189 Encounter for other specified aftercare: Secondary | ICD-10-CM | POA: Diagnosis not present

## 2019-08-13 DIAGNOSIS — G620 Drug-induced polyneuropathy: Secondary | ICD-10-CM | POA: Diagnosis not present

## 2019-08-13 DIAGNOSIS — E119 Type 2 diabetes mellitus without complications: Secondary | ICD-10-CM | POA: Diagnosis not present

## 2019-08-13 DIAGNOSIS — R509 Fever, unspecified: Secondary | ICD-10-CM | POA: Diagnosis not present

## 2019-08-13 DIAGNOSIS — C252 Malignant neoplasm of tail of pancreas: Secondary | ICD-10-CM

## 2019-08-13 DIAGNOSIS — Z5111 Encounter for antineoplastic chemotherapy: Secondary | ICD-10-CM | POA: Diagnosis not present

## 2019-08-13 DIAGNOSIS — T451X5A Adverse effect of antineoplastic and immunosuppressive drugs, initial encounter: Secondary | ICD-10-CM

## 2019-08-13 DIAGNOSIS — G893 Neoplasm related pain (acute) (chronic): Secondary | ICD-10-CM | POA: Diagnosis not present

## 2019-08-13 DIAGNOSIS — I700 Atherosclerosis of aorta: Secondary | ICD-10-CM | POA: Diagnosis not present

## 2019-08-13 DIAGNOSIS — K521 Toxic gastroenteritis and colitis: Secondary | ICD-10-CM

## 2019-08-13 DIAGNOSIS — R197 Diarrhea, unspecified: Secondary | ICD-10-CM | POA: Diagnosis not present

## 2019-08-13 LAB — COMPREHENSIVE METABOLIC PANEL
ALT: 15 U/L (ref 0–44)
AST: 15 U/L (ref 15–41)
Albumin: 3.3 g/dL — ABNORMAL LOW (ref 3.5–5.0)
Alkaline Phosphatase: 65 U/L (ref 38–126)
Anion gap: 12 (ref 5–15)
BUN: 10 mg/dL (ref 6–20)
CO2: 26 mmol/L (ref 22–32)
Calcium: 9 mg/dL (ref 8.9–10.3)
Chloride: 104 mmol/L (ref 98–111)
Creatinine, Ser: 0.59 mg/dL (ref 0.44–1.00)
GFR calc Af Amer: >60 mL/min (ref >60)
GFR calc non Af Amer: >60 mL/min (ref >60)
Glucose, Bld: 104 mg/dL — ABNORMAL HIGH (ref 70–99)
Potassium: 4.2 mmol/L (ref 3.5–5.1)
Sodium: 142 mmol/L (ref 135–145)
Total Bilirubin: 0.3 mg/dL (ref 0.3–1.2)
Total Protein: 6.9 g/dL (ref 6.5–8.1)

## 2019-08-13 LAB — CBC WITH DIFFERENTIAL/PLATELET
Abs Immature Granulocytes: 0.08 10*3/uL — ABNORMAL HIGH (ref 0.00–0.07)
Basophils Absolute: 0.1 10*3/uL (ref 0.0–0.1)
Basophils Relative: 0 %
Eosinophils Absolute: 0.1 10*3/uL (ref 0.0–0.5)
Eosinophils Relative: 0 %
HCT: 33.2 % — ABNORMAL LOW (ref 36.0–46.0)
Hemoglobin: 10.3 g/dL — ABNORMAL LOW (ref 12.0–15.0)
Immature Granulocytes: 0 %
Lymphocytes Relative: 6 %
Lymphs Abs: 1.1 10*3/uL (ref 0.7–4.0)
MCH: 28.2 pg (ref 26.0–34.0)
MCHC: 31 g/dL (ref 30.0–36.0)
MCV: 91 fL (ref 80.0–100.0)
Monocytes Absolute: 1.1 10*3/uL — ABNORMAL HIGH (ref 0.1–1.0)
Monocytes Relative: 5 %
Neutro Abs: 17.6 10*3/uL — ABNORMAL HIGH (ref 1.7–7.7)
Neutrophils Relative %: 89 %
Platelets: 759 10*3/uL — ABNORMAL HIGH (ref 150–400)
RBC: 3.65 MIL/uL — ABNORMAL LOW (ref 3.87–5.11)
RDW: 20.1 % — ABNORMAL HIGH (ref 11.5–15.5)
WBC: 20 10*3/uL — ABNORMAL HIGH (ref 4.0–10.5)
nRBC: 0 % (ref 0.0–0.2)

## 2019-08-13 MED ORDER — PROCHLORPERAZINE MALEATE 10 MG PO TABS: 10.0000 mg | ORAL_TABLET | Freq: Four times a day (QID) | ORAL | 3 refills | Status: DC | PRN

## 2019-08-13 MED ORDER — SODIUM CHLORIDE 0.9% FLUSH
10.0000 mL | Freq: Once | INTRAVENOUS | Status: AC
  Administered 2019-08-13: 10 mL
  Filled 2019-08-13: qty 10

## 2019-08-13 MED ORDER — DIPHENOXYLATE-ATROPINE 2.5-0.025 MG PO TABS: 1.0000 | ORAL_TABLET | Freq: Four times a day (QID) | ORAL | 0 refills | Status: DC | PRN

## 2019-08-13 MED ORDER — LORAZEPAM 0.5 MG PO TABS
0.5000 mg | ORAL_TABLET | Freq: Three times a day (TID) | ORAL | 0 refills | Status: DC | PRN
Start: 2019-08-13 — End: 2020-07-01

## 2019-08-13 MED ORDER — DEXAMETHASONE 4 MG PO TABS
4.0000 mg | ORAL_TABLET | Freq: Every day | ORAL | 1 refills | Status: DC
Start: 2019-08-13 — End: 2019-10-02

## 2019-08-13 MED ORDER — HEPARIN SOD (PORK) LOCK FLUSH 100 UNIT/ML IV SOLN
250.0000 [IU] | Freq: Once | INTRAVENOUS | Status: AC
  Administered 2019-08-13: 500 [IU]
  Filled 2019-08-13: qty 5

## 2019-08-13 NOTE — Progress Notes (Signed)
Patient requested to de-access today and will have accessed tomorrow for treatment.

## 2019-08-13 NOTE — Progress Notes (Signed)
[place] OFFICE PROGRESS NOTE  Patient Care Team: [name], MD as PCP - General (Family Medicine)  ASSESSMENT & PLAN:  Pancreatic cancer [facility]) She has recurrent fever requiring 2 courses of antibiotics this cycle before resolution We will proceed with treatment as scheduled tomorrow I plan to order CT imaging next month for objective assessment of response to therapy  Endometrial cancer [facility]) We will follow with CT imaging She has completed adjuvant therapy  Secondary malignant neoplasm of right ovary ([place]) I will order CT imaging next month Her recent tumor marker is stable  Cancer associated pain Her pain control is stable with intermittent flare She will continue prescribed pain medicine  Diarrhea due to drug She will continue IV fluid support, Imodium and Lomotil I refill her prescription today  Peripheral neuropathy due to chemotherapy [facility]) She has persistent neuropathy but not worse after recent treatment She will continue taking gabapentin as needed   Orders Placed This Encounter  Procedures  . CT CHEST W CONTRAST    Standing Status:   Future    Standing Expiration Date:   08/12/2020    Order Specific Question:   If indicated for the ordered procedure, I authorize the administration of contrast media per Radiology protocol    Answer:   Yes    Order Specific Question:   Preferred imaging location?    Answer:   [facility]    Order Specific Question:   Radiology Contrast Protocol - do NOT remove file path    Answer:   [email or web address]    Order Specific Question:   Is patient pregnant?    Answer:   No  . CT ABDOMEN PELVIS W CONTRAST    Standing Status:   Future    Standing Expiration Date:   08/12/2020    Order Specific Question:   If indicated for the ordered procedure, I authorize the administration of contrast media per Radiology protocol    Answer:   Yes    Order Specific Question:   Preferred imaging  location?    Answer:   [facility]    Order Specific Question:   Radiology Contrast Protocol - do NOT remove file path    Answer:   [email or web address]    Order Specific Question:   Is patient pregnant?    Answer:   No    All questions were answered. The patient knows to call the clinic with any problems, questions or concerns. The total time spent in the appointment was 30 minutes encounter with patients including review of chart and various tests results, discussions about plan of care and coordination of care plan   Heath Lark, MD 08/13/2019 11:15 AM  INTERVAL HISTORY: Please see below for problem oriented charting. 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 Mixed attenuation mass emanates from the right adnexa measuring 10.8 x 8.0 cm very suspicious for right ovarian carcinoma. 2. Abnormality of  the tail of the pancreas may be due to mild pancreatitis and small pseudocyst formation, but neoplasm cannot be excluded. 3. Small amount of ascites within abdomen and pelvis. 4. Small nonobstructing renal calculi bilaterally.    08/30/2017 Pathology Results   1. Uterus and cervix, with left fallopian tube - ENDOMETRIOID ADENOCARCINOMA, FIGO GRADE I, ARISING IN A BACKGROUND OF DIFFUSE COMPLEX ATYPICAL HYPERPLASIA. - CARCINOMA INVADES FOR OF DEPTH OF 0.2 CM WHERE THICKNESS OF  MYOMETRIAL WALL IS 2.1 CM. - ALL RESECTION MARGINS ARE NEGATIVE FOR CARCINOMA. - NEGATIVE FOR LYMPHOVASCULAR OR PERINEURAL INVASION. - CERVICAL STROMA IS NOT INVOLVED. - SEE ONCOLOGY TABLE. - SEE NOTE 2. Ovary and fallopian tube, right - PRIMARY OVARIAN ENDOMETRIOID ADENOCARCINOMA, FIGO GRADE II, 12 CM. - THE OVARIAN SURFACE IS FOCALLY INVOLVED BY CARCINOMA. - NEGATIVE FOR LYMPHOVASCULAR INVASION. - BENIGN UNREMARKABLE FALLOPIAN TUBE, NEGATIVE FOR CARCINOMA. - SEE ONCOLOGY TABLE. - SEE NOTE 3. Cul-de-sac biopsy - METASTATIC ADENOCARCINOMA, MOST CONSISTENT WITH PRIMARY OVARIAN ENDOMETRIOID ADENOCARCINOMA. 4. Ovary, left - BENIGN UNREMARKABLE OVARY, NEGATIVE FOR MALIGNANCY. Microscopic Comment 1. UTERUS, CARCINOMA OR CARCINOSARCOMA Procedure: Total hysterectomy with bilateral salpingo-oophorectomy. Histologic type: Endometrioid adenocarcinoma. Histologic Grade: FIGO Grade I Myometrial invasion: Depth of invasion: 2 mm Myometrial thickness: 21 mm Uterine Serosa Involvement: Not identified Cervical stromal involvement: Not identified Extent of involvement of other organs: Not applicable Lymphovascular invasion: Not identified Regional Lymph Nodes: Examined: 0 Sentinel 0 Non-sentinel 0 Total Tumor block for ancillary studies: 1H MMR / MSI testing: Pending Pathologic Stage Classification (pTNM, AJCC 8th edition): pT1a, pNX (v4.1.0.0) 2. OVARY or FALLOPIAN TUBE or PRIMARY PERITONEUM: Procedure: Salpingo-oophorectomy Specimen Integrity: Intact Tumor Site: Right ovary Ovarian Surface Involvement (required only if applicable): Focally involved by carcinoma Fallopian Tube Surface Involvement (required only if applicable): Not identified Tumor Size: 12 cm Histologic Type: Endometrioid adenocarcinoma Histologic Grade: Grade II Implants (required for advanced stage serous/seromucinous borderline tumors only): Not applicable Other Tissue/ Organ Involvement: Cul de sac biopsy involved  by tumor Largest Extrapelvic Peritoneal Focus (required only if applicable): Not applicable Peritoneal/Ascitic Fluid: Negative for carcinoma (case # [id number]) Treatment Effect (required only for high-grade serous carcinomas): Not applicable Regional Lymph Nodes: No lymph nodes submitted or found Number of Lymph Nodes Examined: 0 Pathologic Stage Classification (pTNM, AJCC 8th Edition): pT2b, pN0 Representative Tumor Block: 2B and 2E 1. Molecular study for microsatellite instability and immunohistochemical stains for MMR-related proteins are pending and will be reported in an addendum. 2. Immunohistochemical stain show that the ovarian tumor is positive for CK7 and PAX8 (both diffuse), CDX2 (patchy and weak); and negative for CK20. This immunoprofile is consistent with the above diagnosis. Dr. Lyndon Code has reviewed this case and concurs with the above diagnosis. Molecular study for microsatellite instability and immunohistochemical stains for MMR-related proteins are pending and will be reported in an addendum   08/30/2017 Genetic Testing   Patient has genetic testing done for MSI  Results revealed patient has the following mutation(s): loss of [facility] 2   08/30/2017 Surgery   Surgeon: Mart Piggs, MD Pre-operative Diagnosis:  1. Adnexal mass 2. Abnormal uterine bleeding 3. H/o Cecal CA  Post-operative Diagnosis:  1. Adhesive disease post colon resection 2. Endometrial cancer NOS 3. Adenocarcinoma unknown origin, right ovary, suspicious for GI primary  Operation:  1. Lysis of adhesions ~30 minutes 2. Robotic-assisted laparoscopic total hysterectomy with right salpingo-oophorectomy and left salpingectomy 3. Left oophorectomy (RA-laparoscopic) 4. Pelvic washings  Findings: Adhesions of omentum to anterior abdominal wall. Enlarged cystic right ovary ~10cm. Uterus  had small nodules on serosa near where right adnexa was intimate with the surface. No obvious intraoperative rupture of  cyst, although in 2 areas the wall was thin and one of these areas had some bleeding. Slight scarring of left bladder dome to LUS/cervix. Uterus on frozen section c/w hyperplasia and a small focus of endometrial CA - no myo invasion, <2cm in size. Frozen section on the right adnexa was carcinoma, met from colon or possibly Gyn, favor GI primary, defer to permanent. Left ovary was WNL.     09/15/2017 Cancer Staging   Staging form: Corpus Uteri - Carcinoma and Carcinosarcoma, AJCC 8th Edition - Pathologic: FIGO Stage IA (pT1a, pN0, cM0) - Signed by Heath Lark, MD on 09/15/2017   09/26/2017 Procedure   Successful placement of a right internal jugular approach power injectable Port-A-Cath. The catheter is ready for immediate use.   11/07/2017 Imaging   1. Since 08/18/2017, similar to slight decrease in size of a pancreatic body/tail junction lesion. Cross modality comparison relative to 09/16/2017 MRI is also grossly similar. 2. No evidence of metastatic disease. 3. Aortic Atherosclerosis (ICD10-I70.0).  4. Left nephrolithiasis.   11/18/2017 Genetic Testing   MSH2 c.2237dupT pathogenic mutation identified in the CancerNext panel.  The CancerNext gene panel offered by [employer] includes sequencing and rearrangement analysis for the following 34 genes:   APC, ATM, BARD1, BMPR1A, BRCA1, BRCA2, BRIP1, CDH1, CDK4, CDKN2A, CHEK2, DICER1, HOXB13, EPCAM, GREM1, MLH1, MRE11A, MSH2, MSH6, MUTYH, NBN, NF1, PALB2, PMS2, POLD1, POLE, PTEN, RAD50, RAD51C, RAD51D, SMAD4, SMARCA4, STK11, and TP53.  The report date is November 18, 2017.  MSH2 [id number] pathogenic mutation identified on somatic testing.  These results are consistent with a diagnosis of Lynch syndrome.   06/18/2019 -  Chemotherapy   The patient had FOLFIRINOX for chemotherapy treatment.     Secondary malignant neoplasm of right ovary ([place])  08/23/2017 Tumor Marker   Patient's tumor was tested for the following markers:  CA-125 Results of the tumor marker test revealed 139.8   08/31/2017 Initial Diagnosis   Secondary malignant neoplasm of right ovary ([place])   09/15/2017 Cancer Staging   Staging form: Ovary, Fallopian Tube, and Primary Peritoneal Carcinoma, AJCC 8th Edition - Pathologic: Stage IIB (pT2b, pN0, cM0) - Signed by Heath Lark, MD on 09/15/2017   09/27/2017 Imaging   No evidence of metastatic disease or other acute findings within the thorax.  4 cm low-attenuation mass in pancreatic tail, highly suspicious for pancreatic carcinoma. This is caused splenic vein thrombosis, with new venous collaterals in the left upper quadrant. Consider endoscopic ultrasound with FNA for tissue diagnosis.  Stable benign hepatic hemangioma.    09/27/2017 Tumor Marker   Patient's tumor was tested for the following markers: CA-125 Results of the tumor marker test revealed 39.4   11/02/2017 Tumor Marker   Patient's tumor was tested for the following markers: CA-125 Results of the tumor marker test revealed 19   11/07/2017 Tumor Marker   Patient's tumor was tested for the following markers: CA-125 Results of the tumor marker test revealed 18.3   11/18/2017 Genetic Testing   MSH2 c.2237dupT pathogenic mutation identified in the CancerNext panel.  The CancerNext gene panel offered by [employer] includes sequencing and rearrangement analysis for the following 34 genes:   APC, ATM, BARD1, BMPR1A, BRCA1, BRCA2, BRIP1, CDH1, CDK4, CDKN2A, CHEK2, DICER1, HOXB13, EPCAM, GREM1, MLH1, MRE11A, MSH2, MSH6, MUTYH, NBN, NF1, PALB2, PMS2, POLD1, POLE, PTEN, RAD50, RAD51C, RAD51D, SMAD4, SMARCA4, STK11, and TP53.  The report  date is November 18, 2017.  MSH2 [id number] pathogenic mutation identified on somatic testing.  These results are consistent with a diagnosis of Lynch syndrome.   12/15/2017 Tumor Marker   Patient's tumor was tested for the following markers: CA-125 Results of the tumor marker test revealed  57.3   01/16/2018 Tumor Marker   Patient's tumor was tested for the following markers: CA-125 Results of the tumor marker test revealed 24.2   04/10/2018 Tumor Marker   Patient's tumor was tested for the following markers: CA-125 Results of the tumor marker test revealed 18.2   07/12/2018 Tumor Marker   Patient's tumor was tested for the following markers: CA-125 Results of the tumor marker test revealed 11.8   10/16/2018 Tumor Marker   Patient's tumor was tested for the following markers: CA125 Results of the tumor marker test revealed 12.4.   Pancreatic cancer ([place])  10/13/2017 Pathology Results   Pancreas tail mass, endoscopic ultrasound-guided, fine needle aspiration II (smears and cell block): Adenocarcinoma   11/18/2017 Genetic Testing   MSH2 c.2237dupT pathogenic mutation identified in the CancerNext panel.  The CancerNext gene panel offered by [employer] includes sequencing and rearrangement analysis for the following 34 genes:   APC, ATM, BARD1, BMPR1A, BRCA1, BRCA2, BRIP1, CDH1, CDK4, CDKN2A, CHEK2, DICER1, HOXB13, EPCAM, GREM1, MLH1, MRE11A, MSH2, MSH6, MUTYH, NBN, NF1, PALB2, PMS2, POLD1, POLE, PTEN, RAD50, RAD51C, RAD51D, SMAD4, SMARCA4, STK11, and TP53.  The report date is November 18, 2017.  MSH2 [id number] pathogenic mutation identified on somatic testing.  These results are consistent with a diagnosis of Lynch syndrome.   11/24/2017 Pathology Results   A. "TAIL OF PANCREAS AND SPLEEN", DISTAL PANCREATECTOMY AND SPLENECTOMY: Invasive ductal adenocarcinoma, moderately to poorly differentiated with focal signet ring cell features, of pancreas (distal).   The carcinoma is 2.5 cm in greatest dimension grossly.   Treatment effects present in the form of fibrosis (50%). No lymphovascular or definite perineural invasion identified. All surgical margins are negative for tumor or high-grade dysplasia. Adjacent mucinous  neoplasm, most consistent with Intraductal papillary mucinous neoplasm (IPMN) with low-grade dysplasia.   Uninvolved pancreas show atrophy and focal acute inflammation.   Twelve benign lymph nodes (0/12). Spleen with no significant histopathologic abnormalities.  PROCEDURE: distal pancreatectomy and splenectomy TUMOR SITE: distal pancreas TUMOR SIZE:  GREATEST DIMENSION: 2.5 cm  ADDITIONAL DIMENSIONS:  x  cm HISTOLOGIC TYPE: ductal adenocarcinoma HISTOLOGIC GRADE: grade 3 TUMOR EXTENSION: peripancreatic soft tissue MARGINS: negative for tumor TREATMENT EFFECT: treatment effects present in the form of fibrosis (50%). LYMPHOVASCULAR INVASION: not identified PERINEURAL INVASION: no definite evidence REGIONAL LYMPH NODES:   NUMBER OF LYMPH NODES INVOLVED: 0   NUMBER OF LYMPH NODES EXAMINED: 12 PATHOLOGIC STAGE CLASSIFICATION (pTNM, AJCC 8th Ed): ypT2, ypN0 DISTANT METASTASIS (pM): pMx ADDITIONAL PATHOLOGIC FINDINGS: mucinous neoplasm, most consistent with intraductal papillary mucinous neoplasm (IPMN) with low-grade dysplasia, is identified adjacent to the main tumor.   11/24/2017 Cancer Staging   Staging form: Exocrine Pancreas, AJCC 8th Edition - Pathologic stage from 11/24/2017: Stage IB (pT2, pN0, cM0) - Signed by Truitt Merle, MD on 01/03/2018   11/25/2017 Surgery   She had surgery at [facility] 1. Exploratory Laparotomy 2. Distal Pancreatectomy and Splenectomy 3. Intraoperative Ultrasound 4. Open Cholecystectomy    12/15/2017 Cancer Staging   Staging form: Exocrine Pancreas, AJCC 8th Edition - Clinical: Stage IB (cT2, cN0, cM0) - Signed by Heath Lark, MD on 12/15/2017   12/28/2017 Imaging   12/28/2017 CT Abdomen  IMPRESSION: 1. Postoperative findings from  recent partial pancreatectomy including a 21 cubic cm fluid collection along the pancreatic resection margin which could represent early pseudocyst. 2. Nodularity along the lateral limb of the  left adrenal gland could also be postoperative but merit surveillance, as the pancreatic lesion was in close proximity to this adrenal gland on the prior CT of 11/07/2017. 3. Asymmetric fullness inferiorly in the left breast. The patient has a history of prior left breast procedures, correlation with mammography is recommended. 4. Other imaging findings of potential clinical significance: Stable hemangioma in the left hepatic lobe. Splenectomy. Aortic Atherosclerosis (ICD10-I70.0). Right hemicolectomy. Small focus of fat necrosis in the right anterior abdominal wall subcutaneous tissues near the laparotomy site. Bilateral nonobstructive nephrolithiasis.   01/02/2018 Tumor Marker   Patient's tumor was tested for the following markers: CA-19-9 Results of the tumor marker test revealed 5   01/03/2018 - 06/05/2018 Chemotherapy   She received modified dose FOLFIRINOX   04/10/2018 Imaging   1. Distal pancreatectomy, without findings of recurrent or metastatic disease. 2. Incompletely imaged hypoenhancement within lower lobe right pulmonary artery branch is likely chronic (but interval since 10/09/2017) pulmonary embolism. Dedicated CTA could further evaluate. 3. Decrease in size of a peripancreatic complex fluid collection anteriorly, likely a resolving pseudocyst. 4. Decreased size of minimal fluid versus a borderline sized node in the gastrohepatic ligament. Recommend attention on follow-up. 5. Hepatic steatosis with a segment 4 hemangioma. 6. Aortic Atherosclerosis (ICD10-I70.0). This is significantly age advanced.   07/12/2018 Tumor Marker   Patient's tumor was tested for the following markers: CA-19-9 Results of the tumor marker test revealed 6   07/12/2018 Imaging   1. Status post distal pancreatectomy with stable postoperative fluid collection adjacent to the ventral aspect of the pancreatic head. No findings to suggest metastatic disease in the abdomen or pelvis. 2. Hepatic steatosis with  small cavernous hemangioma in segment 4A of the liver. 3. Slight decreased size of nonenlarged gastrohepatic ligament lymph node, presumably benign. 4. Aortic atherosclerosis.    10/16/2018 Imaging   Status post distal pancreatectomy. Stable postoperative seroma along the surgical margin.   Status post hysterectomy and right oophorectomy.   Status post right hemicolectomy with appendectomy.   No evidence of recurrent or metastatic disease.   No colonic wall thickening or mass is evident on CT.   01/22/2019 Tumor Marker   Patient's tumor was tested for the following markers: CA-19.9 Results of the tumor marker test revealed 5   01/22/2019 Imaging   1. No evidence of local recurrence or metastatic disease status post distal pancreatectomy and splenectomy. 2. Stable small seroma anterior to the pancreatic head. 3. Postsurgical changes as described. 4. Stable additional incidental findings including a hepatic hemangioma, nonobstructing bilateral renal calculi and aortic Atherosclerosis (ICD10-I70.0).   04/18/2019 Imaging   1. Status post distal pancreatectomy and splenectomy. There has been a gradual increase in ill-defined soft tissue and celiac axis/SMA origin lymph nodes adjacent to the distal pancreatectomy margin and lesser curvature of the stomach/gastric body over sequential prior examinations dated 01/22/2019 and 09/26/2018. An enlarged lymph node or soft tissue nodule adjacent to the SMA origin now measures 1.8 x 0.8 cm. Findings are concerning for local recurrence of pancreatic malignancy. 2. Separately from the above findings, there remains a 1.0 cm low-attenuation nodule anterior to the remnant pancreatic neck, most consistent with postoperative seroma 3. No evidence of distant metastatic disease in the chest, abdomen, or pelvis. 4. Postoperative findings of cholecystectomy, right hemicolectomy, and hysterectomy. 5. Bilateral nonobstructive nephrolithiasis. 6.  Aortic  Atherosclerosis (ICD10-I70.0).       05/28/2019 Tumor Marker   Patient's tumor was tested for the following markers: CA19-9 Results of the tumor marker test revealed 18   05/28/2019 Imaging   1. Status post distal pancreatectomy with continued further progression of the ill-defined soft tissue to the left of the celiac axis/SMA origin, now measuring 1.9 x 1.9 cm and highly concerning for recurrent/metastatic disease. This process abuts both the celiac axis, SMA, and posterior wall of the mid stomach. PET-CT may prove helpful to further evaluate. 2. Bandlike ill-defined soft tissue in the anterior abdomen, potentially peritoneal or omental is similar to perhaps minimally increased qualitatively in the interval. Close attention on follow-up recommended. 3. Left paraumbilical hernia contains a short segment of small bowel without complicating features. 4. Ill-defined very subtle area of decreased attenuation in the posterior aspect of hepatic segment IV. This is likely focal fatty deposition. Close attention on follow-up recommended. 5.  Aortic Atherosclerois (ICD10-170.0)   06/07/2019 Pathology Results   Malignant cells consistent with adenocarcinoma   06/07/2019 Procedure   ENDOSCOPIC FINDING (limited views with linear echoendoscope): :      The examined esophagus was endoscopically normal.      The entire examined stomach was endoscopically normal.      ENDOSONOGRAPHIC FINDING: :      1. An irregular mass was identified in the region of the pancreatic tail resection site. The mass was stellate with very poorly defined borders. Fine needle aspiration for cytology was performed. Color Doppler imaging was utilized prior to needle puncture to confirm a lack of significant vascular structures within the needle path. Four passes were made with the 25 gauge (FNB) needle using a transgastric approach. A cytotechnologist was present to evaluate the adequacy of the specimen. Final cytology results are  pending. Impression:               -  Irregular, stellate soft tissue mass in the region of the pancreatic tail resection site. This was sampled with four transgastric passes with an EUS FNB needle..   06/18/2019 -  Chemotherapy   The patient had FOLFIRINOX for chemotherapy treatment.       REVIEW OF SYSTEMS:   Constitutional: Denies fevers, chills or abnormal weight loss Eyes: Denies blurriness of vision Ears, nose, mouth, throat, and face: Denies mucositis or sore throat Respiratory: Denies cough, dyspnea or wheezes Cardiovascular: Denies palpitation, chest discomfort or lower extremity swelling Skin: Denies abnormal skin rashes Lymphatics: Denies new lymphadenopathy or easy bruising Behavioral/Psych: Mood is stable, no new changes  All other systems were reviewed with the patient and are negative.  I have reviewed the past medical history, past surgical history, social history and family history with the patient and they are unchanged from previous note.  ALLERGIES:  is allergic to codeine, penicillins, and bactrim [sulfamethoxazole-trimethoprim].  MEDICATIONS:  Current Outpatient Medications  Medication Sig Dispense Refill  . cefixime (SUPRAX) 400 MG CAPS capsule Take 1 capsule (400 mg total) by mouth daily. 10 capsule 0  . cetirizine (ZYRTEC) 10 MG tablet Take 10 mg by mouth daily.    . citalopram (CELEXA) 20 MG tablet TAKE 1 TABLET BY MOUTH EVERY DAY 90 tablet 4  . CREON 24000-76000 units CPEP Take 1 capsule by mouth See admin instructions. Take 1 capsule before each meal and 1 capsule after each meal, take 1 cap with snacks    . dexamethasone (DECADRON) 4 MG tablet Take 1 tablet (4 mg total) by mouth  daily. 30 tablet 1  . diclofenac Sodium (VOLTAREN) 1 % GEL Apply 2-4 grams to affected joint 4 times daily as needed. 400 g 1  . diphenoxylate-atropine (LOMOTIL) 2.5-0.025 MG tablet Take 1 tablet by mouth 4 (four) times daily as needed for diarrhea or loose stools. 60 tablet 0  .  estradiol (ESTRACE VAGINAL) 0.1 MG/GM vaginal cream Place 1 Applicatorful vaginally 3 (three) times a week. 42.5 g 12  . fluticasone (FLONASE) 50 MCG/ACT nasal spray Place 1 spray into both nostrils daily.    . folic acid (FOLVITE) 1 MG tablet Take 2 tablets (2 mg total) by mouth daily. 180 tablet 3  . gabapentin (NEURONTIN) 300 MG capsule Take 1 capsule (300 mg total) by mouth 3 (three) times daily. 90 capsule 11  . HYDROmorphone (DILAUDID) 8 MG tablet Take 1 tablet (8 mg total) by mouth every 6 (six) hours as needed for severe pain. 90 tablet 0  . hydrOXYzine (ATARAX/VISTARIL) 10 MG tablet Take 1 tablet (10 mg total) by mouth 3 (three) times daily as needed. 30 tablet 1  . lidocaine-prilocaine (EMLA) cream Apply 1 application topically daily as needed (port). Apply to affected area once 30 g 11  . LORazepam (ATIVAN) 0.5 MG tablet Take 1 tablet (0.5 mg total) by mouth every 8 (eight) hours as needed for anxiety (nausea). 30 tablet 0  . metFORMIN (GLUCOPHAGE) 500 MG tablet TAKE 1 TABLET (500 MG TOTAL) BY MOUTH 2 (TWO) TIMES DAILY WITH A MEAL. (Patient taking differently: Take 500 mg by mouth 2 (two) times daily with a meal. ) 60 tablet 1  . methadone (DOLOPHINE) 10 MG tablet Take 2 tablets (20 mg total) by mouth every 12 (twelve) hours. 60 tablet 0  . methocarbamol (ROBAXIN) 500 MG tablet TAKE 1 TABLET BY MOUTH 3 TIMES DAILY AS NEEDED FOR MUSCLE SPASMS. (Patient taking differently: Take 500 mg by mouth every 8 (eight) hours as needed for muscle spasms. ) 90 tablet 0  . Multiple Vitamin (MULTIVITAMIN WITH MINERALS) TABS tablet Take 1 tablet by mouth daily.    Marland Kitchen omeprazole (PRILOSEC) 40 MG capsule Take 1 capsule (40 mg total) by mouth daily. 30 capsule 11  . ondansetron (ZOFRAN) 8 MG tablet TAKE 1 TAB BY MOUTH EVERY 8HOURS AS NEEDED FOR REFRACTORY NAUSEA/VOMITING START ON DAY 3AFTER CHEMO 8 tablet 7  . predniSONE (DELTASONE) 1 MG tablet Take 4 tablets (4 mg total) by mouth daily with breakfast. 360  tablet 1  . prochlorperazine (COMPAZINE) 10 MG tablet Take 1 tablet (10 mg total) by mouth every 6 (six) hours as needed. 90 tablet 3  . rivaroxaban (XARELTO) 20 MG TABS tablet Take 1 tablet (20 mg total) by mouth daily with supper. 30 tablet 9  . simethicone (MYLICON) 703 MG chewable tablet Chew 125 mg by mouth every 6 (six) hours as needed for flatulence.    . traMADol (ULTRAM) 50 MG tablet Take 1 tablet (50 mg total) by mouth every 6 (six) hours as needed for moderate pain. 60 tablet 0   No current facility-administered medications for this visit.    PHYSICAL EXAMINATION: ECOG PERFORMANCE STATUS: 1 - Symptomatic but completely ambulatory  Vitals:   08/13/19 1042  BP: 130/78  Pulse: 74  Resp: 18  Temp: 98.6 F (37 C)  SpO2: 100%   Filed Weights   08/13/19 1042  Weight: 175 lb (79.4 kg)    GENERAL:alert, no distress and comfortable NEURO: alert & oriented x 3 with fluent speech, no focal motor/sensory deficits  LABORATORY DATA:  I have reviewed the data as listed    Component Value Date/Time   NA 142 08/13/2019 1015   K 4.2 08/13/2019 1015   CL 104 08/13/2019 1015   CO2 26 08/13/2019 1015   GLUCOSE 104 (H) 08/13/2019 1015   BUN 10 08/13/2019 1015   CREATININE 0.59 08/13/2019 1015   CREATININE 0.60 07/16/2019 0957   CREATININE 0.68 07/27/2017 0851   CALCIUM 9.0 08/13/2019 1015   PROT 6.9 08/13/2019 1015   ALBUMIN 3.3 (L) 08/13/2019 1015   AST 15 08/13/2019 1015   AST 25 07/16/2019 0957   ALT 15 08/13/2019 1015   ALT 55 (H) 07/16/2019 0957   ALKPHOS 65 08/13/2019 1015   BILITOT 0.3 08/13/2019 1015   BILITOT <0.2 (L) 07/16/2019 0957   GFRNONAA >60 08/13/2019 1015   GFRNONAA >60 07/16/2019 0957   GFRNONAA 106 07/27/2017 0851   GFRAA >60 08/13/2019 1015   GFRAA >60 07/16/2019 0957   GFRAA 123 07/27/2017 0851    No results found for: SPEP, UPEP  Lab Results  Component Value Date   WBC 20.0 (H) 08/13/2019   NEUTROABS 17.6 (H) 08/13/2019   HGB 10.3 (L)  08/13/2019   HCT 33.2 (L) 08/13/2019   MCV 91.0 08/13/2019   PLT 759 (H) 08/13/2019      Chemistry      Component Value Date/Time   NA 142 08/13/2019 1015   K 4.2 08/13/2019 1015   CL 104 08/13/2019 1015   CO2 26 08/13/2019 1015   BUN 10 08/13/2019 1015   CREATININE 0.59 08/13/2019 1015   CREATININE 0.60 07/16/2019 0957   CREATININE 0.68 07/27/2017 0851      Component Value Date/Time   CALCIUM 9.0 08/13/2019 1015   ALKPHOS 65 08/13/2019 1015   AST 15 08/13/2019 1015   AST 25 07/16/2019 0957   ALT 15 08/13/2019 1015   ALT 55 (H) 07/16/2019 0957   BILITOT 0.3 08/13/2019 1015   BILITOT <0.2 (L) 07/16/2019 0957

## 2019-08-13 NOTE — Assessment & Plan Note (Signed)
She has persistent neuropathy but not worse after recent treatment She will continue taking gabapentin as needed

## 2019-08-13 NOTE — Assessment & Plan Note (Signed)
She has recurrent fever requiring 2 courses of antibiotics this cycle before resolution We will proceed with treatment as scheduled tomorrow I plan to order CT imaging next month for objective assessment of response to therapy

## 2019-08-13 NOTE — Assessment & Plan Note (Signed)
We will follow with CT imaging She has completed adjuvant therapy 

## 2019-08-13 NOTE — Assessment & Plan Note (Signed)
She will continue IV fluid support, Imodium and Lomotil I refill her prescription today

## 2019-08-13 NOTE — Patient Instructions (Signed)

## 2019-08-13 NOTE — Assessment & Plan Note (Signed)
Her pain control is stable with intermittent flare She will continue prescribed pain medicine

## 2019-08-13 NOTE — Assessment & Plan Note (Signed)
I will order CT imaging next month Her recent tumor marker is stable

## 2019-08-14 ENCOUNTER — Inpatient Hospital Stay: Payer: BC Managed Care – PPO

## 2019-08-14 ENCOUNTER — Other Ambulatory Visit: Payer: Self-pay

## 2019-08-14 ENCOUNTER — Telehealth: Payer: Self-pay | Admitting: Hematology and Oncology

## 2019-08-14 VITALS — BP 122/83 | HR 83 | Temp 99.2°F | Resp 18

## 2019-08-14 DIAGNOSIS — C7961 Secondary malignant neoplasm of right ovary: Secondary | ICD-10-CM | POA: Diagnosis not present

## 2019-08-14 DIAGNOSIS — R509 Fever, unspecified: Secondary | ICD-10-CM | POA: Diagnosis not present

## 2019-08-14 DIAGNOSIS — C252 Malignant neoplasm of tail of pancreas: Secondary | ICD-10-CM | POA: Diagnosis not present

## 2019-08-14 DIAGNOSIS — Z5111 Encounter for antineoplastic chemotherapy: Secondary | ICD-10-CM | POA: Diagnosis not present

## 2019-08-14 DIAGNOSIS — Z7189 Other specified counseling: Secondary | ICD-10-CM

## 2019-08-14 DIAGNOSIS — C541 Malignant neoplasm of endometrium: Secondary | ICD-10-CM | POA: Diagnosis not present

## 2019-08-14 DIAGNOSIS — E119 Type 2 diabetes mellitus without complications: Secondary | ICD-10-CM | POA: Diagnosis not present

## 2019-08-14 DIAGNOSIS — I700 Atherosclerosis of aorta: Secondary | ICD-10-CM | POA: Diagnosis not present

## 2019-08-14 DIAGNOSIS — G620 Drug-induced polyneuropathy: Secondary | ICD-10-CM | POA: Diagnosis not present

## 2019-08-14 DIAGNOSIS — R197 Diarrhea, unspecified: Secondary | ICD-10-CM | POA: Diagnosis not present

## 2019-08-14 DIAGNOSIS — G893 Neoplasm related pain (acute) (chronic): Secondary | ICD-10-CM | POA: Diagnosis not present

## 2019-08-14 DIAGNOSIS — Z5189 Encounter for other specified aftercare: Secondary | ICD-10-CM | POA: Diagnosis not present

## 2019-08-14 MED ORDER — SODIUM CHLORIDE 0.9 % IV SOLN
100.0000 mg/m2 | Freq: Once | INTRAVENOUS | Status: AC
  Administered 2019-08-14: 180 mg via INTRAVENOUS
  Filled 2019-08-14: qty 9

## 2019-08-14 MED ORDER — DIPHENHYDRAMINE HCL 50 MG/ML IJ SOLN
50.0000 mg | Freq: Once | INTRAMUSCULAR | Status: AC
  Administered 2019-08-14: 50 mg via INTRAVENOUS

## 2019-08-14 MED ORDER — SODIUM CHLORIDE 0.9 % IV SOLN
1920.0000 mg/m2 | INTRAVENOUS | Status: DC
  Administered 2019-08-14: 3550 mg via INTRAVENOUS
  Filled 2019-08-14: qty 71

## 2019-08-14 MED ORDER — PALONOSETRON HCL INJECTION 0.25 MG/5ML
INTRAVENOUS | Status: AC
  Filled 2019-08-14: qty 5

## 2019-08-14 MED ORDER — FAMOTIDINE IN NACL 20-0.9 MG/50ML-% IV SOLN
INTRAVENOUS | Status: AC
  Filled 2019-08-14: qty 50

## 2019-08-14 MED ORDER — OXALIPLATIN CHEMO INJECTION 100 MG/20ML
42.5000 mg/m2 | Freq: Once | INTRAVENOUS | Status: AC
  Administered 2019-08-14: 80 mg via INTRAVENOUS
  Filled 2019-08-14: qty 16

## 2019-08-14 MED ORDER — PALONOSETRON HCL INJECTION 0.25 MG/5ML
0.2500 mg | Freq: Once | INTRAVENOUS | Status: AC
  Administered 2019-08-14: 0.25 mg via INTRAVENOUS

## 2019-08-14 MED ORDER — SODIUM CHLORIDE 0.9 % IV SOLN
10.0000 mg | Freq: Once | INTRAVENOUS | Status: AC
  Administered 2019-08-14: 10 mg via INTRAVENOUS
  Filled 2019-08-14: qty 10

## 2019-08-14 MED ORDER — ATROPINE SULFATE 1 MG/ML IJ SOLN
INTRAMUSCULAR | Status: AC
  Filled 2019-08-14: qty 1

## 2019-08-14 MED ORDER — SODIUM CHLORIDE 0.9 % IV SOLN
400.0000 mg/m2 | Freq: Once | INTRAVENOUS | Status: AC
  Administered 2019-08-14: 744 mg via INTRAVENOUS
  Filled 2019-08-14: qty 37.2

## 2019-08-14 MED ORDER — HEPARIN SOD (PORK) LOCK FLUSH 100 UNIT/ML IV SOLN
500.0000 [IU] | Freq: Once | INTRAVENOUS | Status: DC | PRN
  Filled 2019-08-14: qty 5

## 2019-08-14 MED ORDER — FAMOTIDINE IN NACL 20-0.9 MG/50ML-% IV SOLN
20.0000 mg | Freq: Once | INTRAVENOUS | Status: AC
  Administered 2019-08-14: 20 mg via INTRAVENOUS

## 2019-08-14 MED ORDER — DEXTROSE 5 % IV SOLN
Freq: Once | INTRAVENOUS | Status: AC
  Filled 2019-08-14: qty 250

## 2019-08-14 MED ORDER — SODIUM CHLORIDE 0.9% FLUSH
10.0000 mL | INTRAVENOUS | Status: DC | PRN
  Filled 2019-08-14: qty 10

## 2019-08-14 MED ORDER — ATROPINE SULFATE 0.4 MG/ML IJ SOLN
INTRAMUSCULAR | Status: AC
  Filled 2019-08-14: qty 1

## 2019-08-14 MED ORDER — DIPHENHYDRAMINE HCL 50 MG/ML IJ SOLN
INTRAMUSCULAR | Status: AC
  Filled 2019-08-14: qty 1

## 2019-08-14 MED ORDER — ATROPINE SULFATE 1 MG/ML IJ SOLN
0.4000 mg | Freq: Once | INTRAMUSCULAR | Status: AC
  Administered 2019-08-14: 0.4 mg via INTRAVENOUS

## 2019-08-14 MED ORDER — SODIUM CHLORIDE 0.9 % IV SOLN
150.0000 mg | Freq: Once | INTRAVENOUS | Status: AC
  Administered 2019-08-14: 150 mg via INTRAVENOUS
  Filled 2019-08-14: qty 150

## 2019-08-14 NOTE — Patient Instructions (Signed)
[facility] Discharge Instructions for Patients Receiving Chemotherapy  Today you received the following chemotherapy agents: Oxaliplatin, Leucovorin, Irinotecan, and Fluorouracil   To help prevent nausea and vomiting after your treatment, we encourage you to take your nausea medication  as prescribed.    If you develop nausea and vomiting that is not controlled by your nausea medication, call the clinic.   BELOW ARE SYMPTOMS THAT SHOULD BE REPORTED IMMEDIATELY:  *FEVER GREATER THAN 100.5 F  *CHILLS WITH OR WITHOUT FEVER  NAUSEA AND VOMITING THAT IS NOT CONTROLLED WITH YOUR NAUSEA MEDICATION  *UNUSUAL SHORTNESS OF BREATH  *UNUSUAL BRUISING OR BLEEDING  TENDERNESS IN MOUTH AND THROAT WITH OR WITHOUT PRESENCE OF ULCERS  *URINARY PROBLEMS  *BOWEL PROBLEMS  UNUSUAL RASH Items with * indicate a potential emergency and should be followed up as soon as possible.  Feel free to call the clinic should you have any questions or concerns. The clinic phone number is [phone].  Please show the CHEMO ALERT CARD at check-in to the Emergency Department and triage nurse.   

## 2019-08-14 NOTE — Telephone Encounter (Signed)
Scheduled appt per 6/21 sch message - pt to get an updated schedule after chemo

## 2019-08-15 DIAGNOSIS — E860 Dehydration: Secondary | ICD-10-CM | POA: Diagnosis not present

## 2019-08-16 ENCOUNTER — Inpatient Hospital Stay: Payer: BC Managed Care – PPO

## 2019-08-16 ENCOUNTER — Other Ambulatory Visit: Payer: Self-pay

## 2019-08-16 VITALS — BP 127/76 | HR 66 | Temp 98.1°F | Resp 18

## 2019-08-16 DIAGNOSIS — R197 Diarrhea, unspecified: Secondary | ICD-10-CM | POA: Diagnosis not present

## 2019-08-16 DIAGNOSIS — G620 Drug-induced polyneuropathy: Secondary | ICD-10-CM | POA: Diagnosis not present

## 2019-08-16 DIAGNOSIS — C541 Malignant neoplasm of endometrium: Secondary | ICD-10-CM

## 2019-08-16 DIAGNOSIS — G893 Neoplasm related pain (acute) (chronic): Secondary | ICD-10-CM | POA: Diagnosis not present

## 2019-08-16 DIAGNOSIS — E119 Type 2 diabetes mellitus without complications: Secondary | ICD-10-CM | POA: Diagnosis not present

## 2019-08-16 DIAGNOSIS — I700 Atherosclerosis of aorta: Secondary | ICD-10-CM | POA: Diagnosis not present

## 2019-08-16 DIAGNOSIS — Z5189 Encounter for other specified aftercare: Secondary | ICD-10-CM | POA: Diagnosis not present

## 2019-08-16 DIAGNOSIS — C7961 Secondary malignant neoplasm of right ovary: Secondary | ICD-10-CM | POA: Diagnosis not present

## 2019-08-16 DIAGNOSIS — Z5111 Encounter for antineoplastic chemotherapy: Secondary | ICD-10-CM | POA: Diagnosis not present

## 2019-08-16 DIAGNOSIS — Z7189 Other specified counseling: Secondary | ICD-10-CM

## 2019-08-16 DIAGNOSIS — C252 Malignant neoplasm of tail of pancreas: Secondary | ICD-10-CM | POA: Diagnosis not present

## 2019-08-16 DIAGNOSIS — R509 Fever, unspecified: Secondary | ICD-10-CM | POA: Diagnosis not present

## 2019-08-16 MED ORDER — HEPARIN SOD (PORK) LOCK FLUSH 100 UNIT/ML IV SOLN
500.0000 [IU] | Freq: Once | INTRAVENOUS | Status: AC | PRN
  Administered 2019-08-16: 500 [IU]
  Filled 2019-08-16: qty 5

## 2019-08-16 MED ORDER — PEGFILGRASTIM-CBQV 6 MG/0.6ML ~~LOC~~ SOSY
PREFILLED_SYRINGE | SUBCUTANEOUS | Status: AC
  Filled 2019-08-16: qty 0.6

## 2019-08-16 MED ORDER — PEGFILGRASTIM-CBQV 6 MG/0.6ML ~~LOC~~ SOSY
6.0000 mg | PREFILLED_SYRINGE | Freq: Once | SUBCUTANEOUS | Status: AC
  Administered 2019-08-16: 6 mg via SUBCUTANEOUS

## 2019-08-16 MED ORDER — SODIUM CHLORIDE 0.9% FLUSH
10.0000 mL | INTRAVENOUS | Status: DC | PRN
  Administered 2019-08-16: 10 mL
  Filled 2019-08-16: qty 10

## 2019-08-17 DIAGNOSIS — E860 Dehydration: Secondary | ICD-10-CM | POA: Diagnosis not present

## 2019-08-20 DIAGNOSIS — K521 Toxic gastroenteritis and colitis: Secondary | ICD-10-CM | POA: Diagnosis not present

## 2019-08-20 DIAGNOSIS — C259 Malignant neoplasm of pancreas, unspecified: Secondary | ICD-10-CM | POA: Diagnosis not present

## 2019-08-20 DIAGNOSIS — E860 Dehydration: Secondary | ICD-10-CM | POA: Diagnosis not present

## 2019-08-22 DIAGNOSIS — E860 Dehydration: Secondary | ICD-10-CM | POA: Diagnosis not present

## 2019-08-24 DIAGNOSIS — E860 Dehydration: Secondary | ICD-10-CM | POA: Diagnosis not present

## 2019-08-27 DIAGNOSIS — E860 Dehydration: Secondary | ICD-10-CM | POA: Diagnosis not present

## 2019-08-27 DIAGNOSIS — K521 Toxic gastroenteritis and colitis: Secondary | ICD-10-CM | POA: Diagnosis not present

## 2019-08-27 DIAGNOSIS — C259 Malignant neoplasm of pancreas, unspecified: Secondary | ICD-10-CM | POA: Diagnosis not present

## 2019-08-31 DIAGNOSIS — E860 Dehydration: Secondary | ICD-10-CM | POA: Diagnosis not present

## 2019-08-31 DIAGNOSIS — K521 Toxic gastroenteritis and colitis: Secondary | ICD-10-CM | POA: Diagnosis not present

## 2019-08-31 DIAGNOSIS — C259 Malignant neoplasm of pancreas, unspecified: Secondary | ICD-10-CM | POA: Diagnosis not present

## 2019-09-01 ENCOUNTER — Other Ambulatory Visit: Payer: Self-pay | Admitting: Rheumatology

## 2019-09-03 ENCOUNTER — Inpatient Hospital Stay: Payer: BC Managed Care – PPO | Attending: Hematology and Oncology

## 2019-09-03 ENCOUNTER — Telehealth: Payer: Self-pay | Admitting: Rheumatology

## 2019-09-03 ENCOUNTER — Inpatient Hospital Stay: Payer: BC Managed Care – PPO

## 2019-09-03 ENCOUNTER — Encounter (HOSPITAL_COMMUNITY): Payer: Self-pay

## 2019-09-03 ENCOUNTER — Ambulatory Visit (HOSPITAL_COMMUNITY)
Admission: RE | Admit: 2019-09-03 | Discharge: 2019-09-03 | Disposition: A | Discharge status: Home / Self Care | Payer: BC Managed Care – PPO | Source: Ambulatory Visit | Attending: Hematology and Oncology | Admitting: Hematology and Oncology

## 2019-09-03 ENCOUNTER — Other Ambulatory Visit: Payer: Self-pay

## 2019-09-03 DIAGNOSIS — G893 Neoplasm related pain (acute) (chronic): Secondary | ICD-10-CM | POA: Insufficient documentation

## 2019-09-03 DIAGNOSIS — C252 Malignant neoplasm of tail of pancreas: Secondary | ICD-10-CM | POA: Insufficient documentation

## 2019-09-03 DIAGNOSIS — Z86711 Personal history of pulmonary embolism: Secondary | ICD-10-CM | POA: Insufficient documentation

## 2019-09-03 DIAGNOSIS — Z90710 Acquired absence of both cervix and uterus: Secondary | ICD-10-CM | POA: Insufficient documentation

## 2019-09-03 DIAGNOSIS — Z7901 Long term (current) use of anticoagulants: Secondary | ICD-10-CM | POA: Insufficient documentation

## 2019-09-03 DIAGNOSIS — C7961 Secondary malignant neoplasm of right ovary: Secondary | ICD-10-CM | POA: Diagnosis not present

## 2019-09-03 DIAGNOSIS — Z8542 Personal history of malignant neoplasm of other parts of uterus: Secondary | ICD-10-CM | POA: Insufficient documentation

## 2019-09-03 DIAGNOSIS — N200 Calculus of kidney: Secondary | ICD-10-CM | POA: Diagnosis not present

## 2019-09-03 DIAGNOSIS — Z90721 Acquired absence of ovaries, unilateral: Secondary | ICD-10-CM | POA: Diagnosis not present

## 2019-09-03 DIAGNOSIS — Z923 Personal history of irradiation: Secondary | ICD-10-CM | POA: Insufficient documentation

## 2019-09-03 DIAGNOSIS — Z9081 Acquired absence of spleen: Secondary | ICD-10-CM | POA: Insufficient documentation

## 2019-09-03 DIAGNOSIS — E860 Dehydration: Secondary | ICD-10-CM | POA: Diagnosis not present

## 2019-09-03 DIAGNOSIS — Z8543 Personal history of malignant neoplasm of ovary: Secondary | ICD-10-CM | POA: Insufficient documentation

## 2019-09-03 DIAGNOSIS — C541 Malignant neoplasm of endometrium: Secondary | ICD-10-CM | POA: Diagnosis not present

## 2019-09-03 DIAGNOSIS — Z7984 Long term (current) use of oral hypoglycemic drugs: Secondary | ICD-10-CM | POA: Diagnosis not present

## 2019-09-03 DIAGNOSIS — Z791 Long term (current) use of non-steroidal anti-inflammatories (NSAID): Secondary | ICD-10-CM | POA: Diagnosis not present

## 2019-09-03 DIAGNOSIS — Z79899 Other long term (current) drug therapy: Secondary | ICD-10-CM | POA: Diagnosis not present

## 2019-09-03 DIAGNOSIS — Z9221 Personal history of antineoplastic chemotherapy: Secondary | ICD-10-CM | POA: Diagnosis not present

## 2019-09-03 DIAGNOSIS — Z7952 Long term (current) use of systemic steroids: Secondary | ICD-10-CM | POA: Insufficient documentation

## 2019-09-03 DIAGNOSIS — Z8507 Personal history of malignant neoplasm of pancreas: Secondary | ICD-10-CM | POA: Diagnosis not present

## 2019-09-03 LAB — CBC WITH DIFFERENTIAL (CANCER CENTER ONLY)
Abs Immature Granulocytes: 0.15 10*3/uL — ABNORMAL HIGH (ref 0.00–0.07)
Basophils Absolute: 0.2 10*3/uL — ABNORMAL HIGH (ref 0.0–0.1)
Basophils Relative: 1 %
Eosinophils Absolute: 0.2 10*3/uL (ref 0.0–0.5)
Eosinophils Relative: 1 %
HCT: 33.8 % — ABNORMAL LOW (ref 36.0–46.0)
Hemoglobin: 10.7 g/dL — ABNORMAL LOW (ref 12.0–15.0)
Immature Granulocytes: 1 %
Lymphocytes Relative: 10 %
Lymphs Abs: 2.1 10*3/uL (ref 0.7–4.0)
MCH: 28.8 pg (ref 26.0–34.0)
MCHC: 31.7 g/dL (ref 30.0–36.0)
MCV: 91.1 fL (ref 80.0–100.0)
Monocytes Absolute: 1.6 10*3/uL — ABNORMAL HIGH (ref 0.1–1.0)
Monocytes Relative: 8 %
Neutro Abs: 16.3 10*3/uL — ABNORMAL HIGH (ref 1.7–7.7)
Neutrophils Relative %: 79 %
Platelet Count: 599 10*3/uL — ABNORMAL HIGH (ref 150–400)
RBC: 3.71 MIL/uL — ABNORMAL LOW (ref 3.87–5.11)
RDW: 20 % — ABNORMAL HIGH (ref 11.5–15.5)
WBC Count: 20.5 10*3/uL — ABNORMAL HIGH (ref 4.0–10.5)
nRBC: 0 % (ref 0.0–0.2)

## 2019-09-03 LAB — CMP (CANCER CENTER ONLY)
ALT: 25 U/L (ref 0–44)
AST: 17 U/L (ref 15–41)
Albumin: 2.6 g/dL — ABNORMAL LOW (ref 3.5–5.0)
Alkaline Phosphatase: 61 U/L (ref 38–126)
Anion gap: 9 (ref 5–15)
BUN: 10 mg/dL (ref 6–20)
CO2: 21 mmol/L — ABNORMAL LOW (ref 22–32)
Calcium: 7 mg/dL — ABNORMAL LOW (ref 8.9–10.3)
Chloride: 112 mmol/L — ABNORMAL HIGH (ref 98–111)
Creatinine: 0.47 mg/dL (ref 0.44–1.00)
GFR, Est AFR Am: >60 mL/min (ref >60)
GFR, Estimated: >60 mL/min (ref >60)
Glucose, Bld: 81 mg/dL (ref 70–99)
Potassium: 3.2 mmol/L — ABNORMAL LOW (ref 3.5–5.1)
Sodium: 142 mmol/L (ref 135–145)
Total Bilirubin: <0.2 mg/dL — ABNORMAL LOW (ref 0.3–1.2)
Total Protein: 5.3 g/dL — ABNORMAL LOW (ref 6.5–8.1)

## 2019-09-03 MED ORDER — HEPARIN SOD (PORK) LOCK FLUSH 100 UNIT/ML IV SOLN
500.0000 [IU] | Freq: Once | INTRAVENOUS | Status: AC
  Administered 2019-09-03: 500 [IU] via INTRAVENOUS

## 2019-09-03 MED ORDER — SODIUM CHLORIDE (PF) 0.9 % IJ SOLN
INTRAMUSCULAR | Status: AC
  Filled 2019-09-03: qty 50

## 2019-09-03 MED ORDER — IOHEXOL 300 MG/ML  SOLN
100.0000 mL | Freq: Once | INTRAMUSCULAR | Status: AC | PRN
  Administered 2019-09-03: 100 mL via INTRAVENOUS

## 2019-09-03 MED ORDER — HEPARIN SOD (PORK) LOCK FLUSH 100 UNIT/ML IV SOLN
INTRAVENOUS | Status: AC
  Filled 2019-09-03: qty 5

## 2019-09-03 NOTE — Telephone Encounter (Signed)
Last Visit: 03/20/2019 telemedicine  Next Visit: message sent to the front desk to schedule.    QP:[id number] osteoarthritis of right knee  Okay to refill voltaren gel?

## 2019-09-03 NOTE — Telephone Encounter (Signed)
-----   Message from [place] sent at 09/03/2019  8:57 AM EDT ----- Please call patient to schedule follow up, she is due. Thanks!

## 2019-09-03 NOTE — Telephone Encounter (Signed)
LMOM for patient to call and schedule follow-up appointment.   °

## 2019-09-04 ENCOUNTER — Other Ambulatory Visit: Payer: Self-pay

## 2019-09-04 ENCOUNTER — Inpatient Hospital Stay: Payer: BC Managed Care – PPO

## 2019-09-04 ENCOUNTER — Inpatient Hospital Stay (HOSPITAL_BASED_OUTPATIENT_CLINIC_OR_DEPARTMENT_OTHER): Payer: BC Managed Care – PPO | Admitting: Hematology and Oncology

## 2019-09-04 ENCOUNTER — Ambulatory Visit: Payer: BC Managed Care – PPO | Admitting: Hematology and Oncology

## 2019-09-04 ENCOUNTER — Ambulatory Visit: Payer: BC Managed Care – PPO

## 2019-09-04 ENCOUNTER — Encounter: Payer: Self-pay | Admitting: Hematology and Oncology

## 2019-09-04 DIAGNOSIS — Z791 Long term (current) use of non-steroidal anti-inflammatories (NSAID): Secondary | ICD-10-CM | POA: Diagnosis not present

## 2019-09-04 DIAGNOSIS — Z9081 Acquired absence of spleen: Secondary | ICD-10-CM | POA: Diagnosis not present

## 2019-09-04 DIAGNOSIS — Z9221 Personal history of antineoplastic chemotherapy: Secondary | ICD-10-CM | POA: Diagnosis not present

## 2019-09-04 DIAGNOSIS — Z90721 Acquired absence of ovaries, unilateral: Secondary | ICD-10-CM | POA: Diagnosis not present

## 2019-09-04 DIAGNOSIS — Z90710 Acquired absence of both cervix and uterus: Secondary | ICD-10-CM | POA: Diagnosis not present

## 2019-09-04 DIAGNOSIS — Z86711 Personal history of pulmonary embolism: Secondary | ICD-10-CM | POA: Diagnosis not present

## 2019-09-04 DIAGNOSIS — Z923 Personal history of irradiation: Secondary | ICD-10-CM | POA: Diagnosis not present

## 2019-09-04 DIAGNOSIS — G893 Neoplasm related pain (acute) (chronic): Secondary | ICD-10-CM

## 2019-09-04 DIAGNOSIS — C252 Malignant neoplasm of tail of pancreas: Secondary | ICD-10-CM | POA: Diagnosis not present

## 2019-09-04 DIAGNOSIS — Z8543 Personal history of malignant neoplasm of ovary: Secondary | ICD-10-CM | POA: Diagnosis not present

## 2019-09-04 DIAGNOSIS — Z7189 Other specified counseling: Secondary | ICD-10-CM

## 2019-09-04 DIAGNOSIS — Z79899 Other long term (current) drug therapy: Secondary | ICD-10-CM | POA: Diagnosis not present

## 2019-09-04 DIAGNOSIS — Z7984 Long term (current) use of oral hypoglycemic drugs: Secondary | ICD-10-CM | POA: Diagnosis not present

## 2019-09-04 DIAGNOSIS — Z7952 Long term (current) use of systemic steroids: Secondary | ICD-10-CM | POA: Diagnosis not present

## 2019-09-04 DIAGNOSIS — Z8542 Personal history of malignant neoplasm of other parts of uterus: Secondary | ICD-10-CM | POA: Diagnosis not present

## 2019-09-04 DIAGNOSIS — Z7901 Long term (current) use of anticoagulants: Secondary | ICD-10-CM | POA: Diagnosis not present

## 2019-09-04 MED ORDER — HYDROMORPHONE HCL 8 MG PO TABS: 8.0000 mg | ORAL_TABLET | Freq: Four times a day (QID) | ORAL | 0 refills | Status: DC | PRN

## 2019-09-04 NOTE — Assessment & Plan Note (Signed)
We have extensive discussions about the goals of care and the role of radiation therapy and palliative chemotherapy She is in agreement to proceed

## 2019-09-04 NOTE — Progress Notes (Signed)
Sent Urgent Referral message to Radiation Oncology scheduling pool and Shona Simpson PA for disease progression.

## 2019-09-04 NOTE — Assessment & Plan Note (Signed)
I refill her prescription pain medicine today According to the patient, her pain is better controlled and she was surprised to see signs of disease progression because she is not as symptomatic as before

## 2019-09-04 NOTE — Assessment & Plan Note (Signed)
I have reviewed multiple CT imaging with the patient Her CT scan from yesterday showed mild disease progression but no other distant metastatic disease The area of progression is very ill-defined We discussed the risk and benefits of radiation therapy and she is in agreement for the referral I recommend radiation now versus further systemic chemotherapy to spare her from side effects of treatment and to try to target the disease from a different modality standpoint I will see her back in a few weeks for further follow-up When she has completed radiation therapy, I plan to wait approximately 6 to 8 weeks before repeating CT imaging Her tumor marker is not helpful

## 2019-09-04 NOTE — Progress Notes (Signed)
Time OFFICE PROGRESS NOTE  Patient Care Team: [name], MD as PCP - General (Family Medicine)  ASSESSMENT & PLAN:  Pancreatic cancer [facility]) I have reviewed multiple CT imaging with the patient Her CT scan from yesterday showed mild disease progression but no other distant metastatic disease The area of progression is very ill-defined We discussed the risk and benefits of radiation therapy and she is in agreement for the referral I recommend radiation now versus further systemic chemotherapy to spare her from side effects of treatment and to try to target the disease from a different modality standpoint I will see her back in a few weeks for further follow-up When she has completed radiation therapy, I plan to wait approximately 6 to 8 weeks before repeating CT imaging Her tumor marker is not helpful  Cancer associated pain I refill her prescription pain medicine today According to the patient, her pain is better controlled and she was surprised to see signs of disease progression because she is not as symptomatic as before  Goals of care, counseling/discussion We have extensive discussions about the goals of care and the role of radiation therapy and palliative chemotherapy She is in agreement to proceed   Orders Placed This Encounter  Procedures  . Ambulatory referral to Radiation Oncology    Referral Priority:   Routine    Referral Type:   Consultation    Referral Reason:   Specialty Services Required    Referred to Provider:   Kyung Rudd, MD    Requested Specialty:   Radiation Oncology    Number of Visits Requested:   1    All questions were answered. The patient knows to call the clinic with any problems, questions or concerns. The total time spent in the appointment was 40 minutes encounter with patients including review of chart and various tests results, discussions about plan of care and coordination of care plan   Heath Lark, MD 09/04/2019  9:45 AM  INTERVAL HISTORY: Please see below for problem oriented charting. 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 Mixed attenuation mass emanates from the right adnexa measuring 10.8 x 8.0 cm very suspicious for right ovarian carcinoma. 2. Abnormality of the tail of the pancreas may be due to mild pancreatitis and small pseudocyst formation, but neoplasm cannot be excluded. 3. Small amount of ascites within abdomen and pelvis. 4. Small nonobstructing renal calculi bilaterally.    08/30/2017 Pathology Results   1. Uterus and cervix, with left fallopian tube - ENDOMETRIOID ADENOCARCINOMA, FIGO GRADE I, ARISING IN A BACKGROUND OF DIFFUSE COMPLEX ATYPICAL HYPERPLASIA. - CARCINOMA INVADES FOR OF DEPTH OF 0.2 CM WHERE THICKNESS OF MYOMETRIAL WALL IS 2.1 CM. - ALL RESECTION MARGINS ARE NEGATIVE FOR CARCINOMA. - NEGATIVE FOR LYMPHOVASCULAR OR PERINEURAL INVASION. - CERVICAL STROMA IS NOT INVOLVED. - SEE ONCOLOGY TABLE. - SEE NOTE 2. Ovary and fallopian tube, right - PRIMARY OVARIAN ENDOMETRIOID ADENOCARCINOMA, FIGO GRADE II, 12 CM. - THE OVARIAN SURFACE IS FOCALLY INVOLVED BY CARCINOMA. - NEGATIVE FOR LYMPHOVASCULAR INVASION. - BENIGN  UNREMARKABLE FALLOPIAN TUBE, NEGATIVE FOR CARCINOMA. - SEE ONCOLOGY TABLE. - SEE NOTE 3. Cul-de-sac biopsy - METASTATIC ADENOCARCINOMA, MOST CONSISTENT WITH PRIMARY OVARIAN ENDOMETRIOID ADENOCARCINOMA. 4. Ovary, left - BENIGN UNREMARKABLE OVARY, NEGATIVE FOR  MALIGNANCY. Microscopic Comment 1. UTERUS, CARCINOMA OR CARCINOSARCOMA Procedure: Total hysterectomy with bilateral salpingo-oophorectomy. Histologic type: Endometrioid adenocarcinoma. Histologic Grade: FIGO Grade I Myometrial invasion: Depth of invasion: 2 mm Myometrial thickness: 21 mm Uterine Serosa Involvement: Not identified Cervical stromal involvement: Not identified Extent of involvement of other organs: Not applicable Lymphovascular invasion: Not identified Regional Lymph Nodes: Examined: 0 Sentinel 0 Non-sentinel 0 Total Tumor block for ancillary studies: 1H MMR / MSI testing: Pending Pathologic Stage Classification (pTNM, AJCC 8th edition): pT1a, pNX (v4.1.0.0) 2. OVARY or FALLOPIAN TUBE or PRIMARY PERITONEUM: Procedure: Salpingo-oophorectomy Specimen Integrity: Intact Tumor Site: Right ovary Ovarian Surface Involvement (required only if applicable): Focally involved by carcinoma Fallopian Tube Surface Involvement (required only if applicable): Not identified Tumor Size: 12 cm Histologic Type: Endometrioid adenocarcinoma Histologic Grade: Grade II Implants (required for advanced stage serous/seromucinous borderline tumors only): Not applicable Other Tissue/ Organ Involvement: Cul de sac biopsy involved by tumor Largest Extrapelvic Peritoneal Focus (required only if applicable): Not applicable Peritoneal/Ascitic Fluid: Negative for carcinoma (case # [id number]) Treatment Effect (required only for high-grade serous carcinomas): Not applicable Regional Lymph Nodes: No lymph nodes submitted or found Number of Lymph Nodes Examined: 0 Pathologic Stage Classification (pTNM, AJCC 8th Edition): pT2b, pN0 Representative Tumor Block: 2B and 2E 1. Molecular study for microsatellite instability and immunohistochemical stains for MMR-related proteins are pending and will be reported in an addendum. 2. Immunohistochemical stain show that the ovarian tumor is positive for CK7 and  PAX8 (both diffuse), CDX2 (patchy and weak); and negative for CK20. This immunoprofile is consistent with the above diagnosis. Dr. Lyndon Code has reviewed this case and concurs with the above diagnosis. Molecular study for microsatellite instability and immunohistochemical stains for MMR-related proteins are pending and will be reported in an addendum   08/30/2017 Genetic Testing   Patient has genetic testing done for MSI  Results revealed patient has the following mutation(s): loss of [facility] 2   08/30/2017 Surgery   Surgeon: Mart Piggs, MD Pre-operative Diagnosis:  1. Adnexal mass 2. Abnormal uterine bleeding 3. H/o Cecal CA  Post-operative Diagnosis:  1. Adhesive disease post colon resection 2. Endometrial cancer NOS 3. Adenocarcinoma unknown origin, right ovary, suspicious for GI primary  Operation:  1. Lysis of adhesions ~30 minutes 2. Robotic-assisted laparoscopic total hysterectomy with right salpingo-oophorectomy and left salpingectomy 3. Left oophorectomy (RA-laparoscopic) 4. Pelvic washings  Findings: Adhesions of omentum to anterior abdominal wall. Enlarged cystic right ovary ~10cm. Uterus had small nodules on serosa near where right adnexa was intimate with the surface. No obvious intraoperative rupture of cyst, although in 2 areas the wall was thin and one of these areas had some bleeding. Slight scarring of left bladder dome to LUS/cervix. Uterus on frozen section c/w hyperplasia and a small focus of endometrial CA - no myo invasion, <2cm in size. Frozen section on the right adnexa was carcinoma, met from colon or possibly Gyn, favor GI primary, defer to permanent. Left ovary was WNL.     09/15/2017 Cancer Staging   Staging form: Corpus Uteri - Carcinoma and Carcinosarcoma, AJCC 8th Edition - Pathologic: FIGO Stage IA (pT1a, pN0, cM0) - Signed by Heath Lark, MD on 09/15/2017   09/26/2017 Procedure   Successful placement of a right internal jugular approach power injectable  Port-A-Cath. The  catheter is ready for immediate use.   11/07/2017 Imaging   1. Since 08/18/2017, similar to slight decrease in size of a pancreatic body/tail junction lesion. Cross modality comparison relative to 09/16/2017 MRI is also grossly similar. 2. No evidence of metastatic disease. 3. Aortic Atherosclerosis (ICD10-I70.0).  4. Left nephrolithiasis.   11/18/2017 Genetic Testing   MSH2 c.2237dupT pathogenic mutation identified in the CancerNext panel.  The CancerNext gene panel offered by [employer] includes sequencing and rearrangement analysis for the following 34 genes:   APC, ATM, BARD1, BMPR1A, BRCA1, BRCA2, BRIP1, CDH1, CDK4, CDKN2A, CHEK2, DICER1, HOXB13, EPCAM, GREM1, MLH1, MRE11A, MSH2, MSH6, MUTYH, NBN, NF1, PALB2, PMS2, POLD1, POLE, PTEN, RAD50, RAD51C, RAD51D, SMAD4, SMARCA4, STK11, and TP53.  The report date is November 18, 2017.  MSH2 [id number] pathogenic mutation identified on somatic testing.  These results are consistent with a diagnosis of Lynch syndrome.   06/18/2019 - 08/14/2019 Chemotherapy   The patient had FOLFIRINOX for chemotherapy treatment.     Secondary malignant neoplasm of right ovary ([place])  08/23/2017 Tumor Marker   Patient's tumor was tested for the following markers: CA-125 Results of the tumor marker test revealed 139.8   08/31/2017 Initial Diagnosis   Secondary malignant neoplasm of right ovary ([place])   09/15/2017 Cancer Staging   Staging form: Ovary, Fallopian Tube, and Primary Peritoneal Carcinoma, AJCC 8th Edition - Pathologic: Stage IIB (pT2b, pN0, cM0) - Signed by Heath Lark, MD on 09/15/2017   09/27/2017 Imaging   No evidence of metastatic disease or other acute findings within the thorax.  4 cm low-attenuation mass in pancreatic tail, highly suspicious for pancreatic carcinoma. This is caused splenic vein thrombosis, with new venous collaterals in the left upper quadrant. Consider endoscopic ultrasound with FNA for tissue  diagnosis.  Stable benign hepatic hemangioma.    09/27/2017 Tumor Marker   Patient's tumor was tested for the following markers: CA-125 Results of the tumor marker test revealed 39.4   11/02/2017 Tumor Marker   Patient's tumor was tested for the following markers: CA-125 Results of the tumor marker test revealed 19   11/07/2017 Tumor Marker   Patient's tumor was tested for the following markers: CA-125 Results of the tumor marker test revealed 18.3   11/18/2017 Genetic Testing   MSH2 c.2237dupT pathogenic mutation identified in the CancerNext panel.  The CancerNext gene panel offered by [employer] includes sequencing and rearrangement analysis for the following 34 genes:   APC, ATM, BARD1, BMPR1A, BRCA1, BRCA2, BRIP1, CDH1, CDK4, CDKN2A, CHEK2, DICER1, HOXB13, EPCAM, GREM1, MLH1, MRE11A, MSH2, MSH6, MUTYH, NBN, NF1, PALB2, PMS2, POLD1, POLE, PTEN, RAD50, RAD51C, RAD51D, SMAD4, SMARCA4, STK11, and TP53.  The report date is November 18, 2017.  MSH2 [id number] pathogenic mutation identified on somatic testing.  These results are consistent with a diagnosis of Lynch syndrome.   12/15/2017 Tumor Marker   Patient's tumor was tested for the following markers: CA-125 Results of the tumor marker test revealed 57.3   01/16/2018 Tumor Marker   Patient's tumor was tested for the following markers: CA-125 Results of the tumor marker test revealed 24.2   04/10/2018 Tumor Marker   Patient's tumor was tested for the following markers: CA-125 Results of the tumor marker test revealed 18.2   07/12/2018 Tumor Marker   Patient's tumor was tested for the following markers: CA-125 Results of the tumor marker test revealed 11.8   10/16/2018 Tumor Marker   Patient's tumor was tested for the following markers: CA125 Results of the tumor marker test  revealed 12.4.   Pancreatic cancer ([place])  10/13/2017 Pathology Results   Pancreas tail mass, endoscopic ultrasound-guided, fine  needle aspiration II (smears and cell block): Adenocarcinoma   11/18/2017 Genetic Testing   MSH2 c.2237dupT pathogenic mutation identified in the CancerNext panel.  The CancerNext gene panel offered by [employer] includes sequencing and rearrangement analysis for the following 34 genes:   APC, ATM, BARD1, BMPR1A, BRCA1, BRCA2, BRIP1, CDH1, CDK4, CDKN2A, CHEK2, DICER1, HOXB13, EPCAM, GREM1, MLH1, MRE11A, MSH2, MSH6, MUTYH, NBN, NF1, PALB2, PMS2, POLD1, POLE, PTEN, RAD50, RAD51C, RAD51D, SMAD4, SMARCA4, STK11, and TP53.  The report date is November 18, 2017.  MSH2 [id number] pathogenic mutation identified on somatic testing.  These results are consistent with a diagnosis of Lynch syndrome.   11/24/2017 Pathology Results   A. "TAIL OF PANCREAS AND SPLEEN", DISTAL PANCREATECTOMY AND SPLENECTOMY: Invasive ductal adenocarcinoma, moderately to poorly differentiated with focal signet ring cell features, of pancreas (distal).   The carcinoma is 2.5 cm in greatest dimension grossly.   Treatment effects present in the form of fibrosis (50%). No lymphovascular or definite perineural invasion identified. All surgical margins are negative for tumor or high-grade dysplasia. Adjacent mucinous neoplasm, most consistent with Intraductal papillary mucinous neoplasm (IPMN) with low-grade dysplasia.   Uninvolved pancreas show atrophy and focal acute inflammation.   Twelve benign lymph nodes (0/12). Spleen with no significant histopathologic abnormalities.  PROCEDURE: distal pancreatectomy and splenectomy TUMOR SITE: distal pancreas TUMOR SIZE:  GREATEST DIMENSION: 2.5 cm  ADDITIONAL DIMENSIONS:  x  cm HISTOLOGIC TYPE: ductal adenocarcinoma HISTOLOGIC GRADE: grade 3 TUMOR EXTENSION: peripancreatic soft tissue MARGINS: negative for tumor TREATMENT EFFECT: treatment effects present in the form of fibrosis (50%). LYMPHOVASCULAR  INVASION: not identified PERINEURAL INVASION: no definite evidence REGIONAL LYMPH NODES:   NUMBER OF LYMPH NODES INVOLVED: 0   NUMBER OF LYMPH NODES EXAMINED: 12 PATHOLOGIC STAGE CLASSIFICATION (pTNM, AJCC 8th Ed): ypT2, ypN0 DISTANT METASTASIS (pM): pMx ADDITIONAL PATHOLOGIC FINDINGS: mucinous neoplasm, most consistent with intraductal papillary mucinous neoplasm (IPMN) with low-grade dysplasia, is identified adjacent to the main tumor.   11/24/2017 Cancer Staging   Staging form: Exocrine Pancreas, AJCC 8th Edition - Pathologic stage from 11/24/2017: Stage IB (pT2, pN0, cM0) - Signed by Truitt Merle, MD on 01/03/2018   11/25/2017 Surgery   She had surgery at [facility] 1. Exploratory Laparotomy 2. Distal Pancreatectomy and Splenectomy 3. Intraoperative Ultrasound 4. Open Cholecystectomy    12/15/2017 Cancer Staging   Staging form: Exocrine Pancreas, AJCC 8th Edition - Clinical: Stage IB (cT2, cN0, cM0) - Signed by Heath Lark, MD on 12/15/2017   12/28/2017 Imaging   12/28/2017 CT Abdomen  IMPRESSION: 1. Postoperative findings from recent partial pancreatectomy including a 21 cubic cm fluid collection along the pancreatic resection margin which could represent early pseudocyst. 2. Nodularity along the lateral limb of the left adrenal gland could also be postoperative but merit surveillance, as the pancreatic lesion was in close proximity to this adrenal gland on the prior CT of 11/07/2017. 3. Asymmetric fullness inferiorly in the left breast. The patient has a history of prior left breast procedures, correlation with mammography is recommended. 4. Other imaging findings of potential clinical significance: Stable hemangioma in the left hepatic lobe. Splenectomy. Aortic Atherosclerosis (ICD10-I70.0). Right hemicolectomy. Small focus of fat necrosis in the right anterior abdominal wall subcutaneous tissues near the laparotomy site. Bilateral nonobstructive nephrolithiasis.   01/02/2018  Tumor Marker   Patient's tumor was tested for the following markers: CA-19-9 Results of the tumor marker test  revealed 5   01/03/2018 - 06/05/2018 Chemotherapy   She received modified dose FOLFIRINOX   04/10/2018 Imaging   1. Distal pancreatectomy, without findings of recurrent or metastatic disease. 2. Incompletely imaged hypoenhancement within lower lobe right pulmonary artery branch is likely chronic (but interval since 10/09/2017) pulmonary embolism. Dedicated CTA could further evaluate. 3. Decrease in size of a peripancreatic complex fluid collection anteriorly, likely a resolving pseudocyst. 4. Decreased size of minimal fluid versus a borderline sized node in the gastrohepatic ligament. Recommend attention on follow-up. 5. Hepatic steatosis with a segment 4 hemangioma. 6. Aortic Atherosclerosis (ICD10-I70.0). This is significantly age advanced.   07/12/2018 Tumor Marker   Patient's tumor was tested for the following markers: CA-19-9 Results of the tumor marker test revealed 6   07/12/2018 Imaging   1. Status post distal pancreatectomy with stable postoperative fluid collection adjacent to the ventral aspect of the pancreatic head. No findings to suggest metastatic disease in the abdomen or pelvis. 2. Hepatic steatosis with small cavernous hemangioma in segment 4A of the liver. 3. Slight decreased size of nonenlarged gastrohepatic ligament lymph node, presumably benign. 4. Aortic atherosclerosis.    10/16/2018 Imaging   Status post distal pancreatectomy. Stable postoperative seroma along the surgical margin.   Status post hysterectomy and right oophorectomy.   Status post right hemicolectomy with appendectomy.   No evidence of recurrent or metastatic disease.   No colonic wall thickening or mass is evident on CT.   01/22/2019 Tumor Marker   Patient's tumor was tested for the following markers: CA-19.9 Results of the tumor marker test revealed 5   01/22/2019 Imaging   1. No  evidence of local recurrence or metastatic disease status post distal pancreatectomy and splenectomy. 2. Stable small seroma anterior to the pancreatic head. 3. Postsurgical changes as described. 4. Stable additional incidental findings including a hepatic hemangioma, nonobstructing bilateral renal calculi and aortic Atherosclerosis (ICD10-I70.0).   04/18/2019 Imaging   1. Status post distal pancreatectomy and splenectomy. There has been a gradual increase in ill-defined soft tissue and celiac axis/SMA origin lymph nodes adjacent to the distal pancreatectomy margin and lesser curvature of the stomach/gastric body over sequential prior examinations dated 01/22/2019 and 09/26/2018. An enlarged lymph node or soft tissue nodule adjacent to the SMA origin now measures 1.8 x 0.8 cm. Findings are concerning for local recurrence of pancreatic malignancy. 2. Separately from the above findings, there remains a 1.0 cm low-attenuation nodule anterior to the remnant pancreatic neck, most consistent with postoperative seroma 3. No evidence of distant metastatic disease in the chest, abdomen, or pelvis. 4. Postoperative findings of cholecystectomy, right hemicolectomy, and hysterectomy. 5. Bilateral nonobstructive nephrolithiasis. 6.  Aortic Atherosclerosis (ICD10-I70.0).       05/28/2019 Tumor Marker   Patient's tumor was tested for the following markers: CA19-9 Results of the tumor marker test revealed 18   05/28/2019 Imaging   1. Status post distal pancreatectomy with continued further progression of the ill-defined soft tissue to the left of the celiac axis/SMA origin, now measuring 1.9 x 1.9 cm and highly concerning for recurrent/metastatic disease. This process abuts both the celiac axis, SMA, and posterior wall of the mid stomach. PET-CT may prove helpful to further evaluate. 2. Bandlike ill-defined soft tissue in the anterior abdomen, potentially peritoneal or omental is similar to perhaps minimally  increased qualitatively in the interval. Close attention on follow-up recommended. 3. Left paraumbilical hernia contains a short segment of small bowel without complicating features. 4. Ill-defined very subtle area of decreased  attenuation in the posterior aspect of hepatic segment IV. This is likely focal fatty deposition. Close attention on follow-up recommended. 5.  Aortic Atherosclerois (ICD10-170.0)   06/07/2019 Pathology Results   Malignant cells consistent with adenocarcinoma   06/07/2019 Procedure   ENDOSCOPIC FINDING (limited views with linear echoendoscope): :      The examined esophagus was endoscopically normal.      The entire examined stomach was endoscopically normal.      ENDOSONOGRAPHIC FINDING: :      1. An irregular mass was identified in the region of the pancreatic tail resection site. The mass was stellate with very poorly defined borders. Fine needle aspiration for cytology was performed. Color Doppler imaging was utilized prior to needle puncture to confirm a lack of significant vascular structures within the needle path. Four passes were made with the 25 gauge (FNB) needle using a transgastric approach. A cytotechnologist was present to evaluate the adequacy of the specimen. Final cytology results are pending. Impression:               -  Irregular, stellate soft tissue mass in the region of the pancreatic tail resection site. This was sampled with four transgastric passes with an EUS FNB needle..   06/18/2019 - 08/14/2019 Chemotherapy   The patient had FOLFIRINOX for chemotherapy treatment.     09/03/2019 Imaging   1. Findings are again highly concerning for locally recurrent disease adjacent to the pancreatectomy bed, with enlarging soft tissue mass which is intimately associated with the superior mesenteric artery, celiac axis and superior aspect of the left renal  vein, as detailed above. 2. No evidence of metastatic disease in the thorax. 3. 2.7 x 1.2 cm cavernous  hemangioma in segment 4A of the liver again noted. 4. Small nonobstructive calculi in the collecting systems of both kidneys measuring up to 3 mm in the lower pole collecting system of the left kidney. 5. Aortic atherosclerosis. 6. Additional incidental findings, as above.     REVIEW OF SYSTEMS:   Constitutional: Denies fevers, chills or abnormal weight loss Eyes: Denies blurriness of vision Ears, nose, mouth, throat, and face: Denies mucositis or sore throat Respiratory: Denies cough, dyspnea or wheezes Cardiovascular: Denies palpitation, chest discomfort or lower extremity swelling Gastrointestinal:  Denies nausea, heartburn or change in bowel habits Skin: Denies abnormal skin rashes Lymphatics: Denies new lymphadenopathy or easy bruising Neurological:Denies numbness, tingling or new weaknesses Behavioral/Psych: Mood is stable, no new changes  All other systems were reviewed with the patient and are negative.  I have reviewed the past medical history, past surgical history, social history and family history with the patient and they are unchanged from previous note.  ALLERGIES:  is allergic to codeine, penicillins, and bactrim [sulfamethoxazole-trimethoprim].  MEDICATIONS:  Current Outpatient Medications  Medication Sig Dispense Refill  . cefixime (SUPRAX) 400 MG CAPS capsule Take 1 capsule (400 mg total) by mouth daily. 10 capsule 0  . cetirizine (ZYRTEC) 10 MG tablet Take 10 mg by mouth daily.    . citalopram (CELEXA) 20 MG tablet TAKE 1 TABLET BY MOUTH EVERY DAY 90 tablet 4  . CREON 24000-76000 units CPEP Take 1 capsule by mouth See admin instructions. Take 1 capsule before each meal and 1 capsule after each meal, take 1 cap with snacks    . dexamethasone (DECADRON) 4 MG tablet Take 1 tablet (4 mg total) by mouth daily. 30 tablet 1  . diclofenac Sodium (VOLTAREN) 1 % GEL APPLY 2-4 GRAMS TO AFFECTED JOINT 4  TIMES DAILY AS NEEDED. 400 g 1  . diphenoxylate-atropine (LOMOTIL)  2.5-0.025 MG tablet Take 1 tablet by mouth 4 (four) times daily as needed for diarrhea or loose stools. 60 tablet 0  . estradiol (ESTRACE VAGINAL) 0.1 MG/GM vaginal cream Place 1 Applicatorful vaginally 3 (three) times a week. 42.5 g 12  . fluticasone (FLONASE) 50 MCG/ACT nasal spray Place 1 spray into both nostrils daily.    Marland Kitchen gabapentin (NEURONTIN) 300 MG capsule Take 1 capsule (300 mg total) by mouth 3 (three) times daily. 90 capsule 11  . HYDROmorphone (DILAUDID) 8 MG tablet Take 1 tablet (8 mg total) by mouth every 6 (six) hours as needed for severe pain. 90 tablet 0  . hydrOXYzine (ATARAX/VISTARIL) 10 MG tablet Take 1 tablet (10 mg total) by mouth 3 (three) times daily as needed. 30 tablet 1  . lidocaine-prilocaine (EMLA) cream Apply 1 application topically daily as needed (port). Apply to affected area once 30 g 11  . LORazepam (ATIVAN) 0.5 MG tablet Take 1 tablet (0.5 mg total) by mouth every 8 (eight) hours as needed for anxiety (nausea). 30 tablet 0  . metFORMIN (GLUCOPHAGE) 500 MG tablet TAKE 1 TABLET (500 MG TOTAL) BY MOUTH 2 (TWO) TIMES DAILY WITH A MEAL. (Patient taking differently: Take 500 mg by mouth 2 (two) times daily with a meal. ) 60 tablet 1  . methadone (DOLOPHINE) 10 MG tablet Take 2 tablets (20 mg total) by mouth every 12 (twelve) hours. 60 tablet 0  . methocarbamol (ROBAXIN) 500 MG tablet TAKE 1 TABLET BY MOUTH 3 TIMES DAILY AS NEEDED FOR MUSCLE SPASMS. (Patient taking differently: Take 500 mg by mouth every 8 (eight) hours as needed for muscle spasms. ) 90 tablet 0  . Multiple Vitamin (MULTIVITAMIN WITH MINERALS) TABS tablet Take 1 tablet by mouth daily.    Marland Kitchen omeprazole (PRILOSEC) 40 MG capsule Take 1 capsule (40 mg total) by mouth daily. 30 capsule 11  . ondansetron (ZOFRAN) 8 MG tablet TAKE 1 TAB BY MOUTH EVERY 8HOURS AS NEEDED FOR REFRACTORY NAUSEA/VOMITING START ON DAY 3AFTER CHEMO 8 tablet 7  . predniSONE (DELTASONE) 1 MG tablet Take 4 tablets (4 mg total) by mouth daily  with breakfast. 360 tablet 1  . prochlorperazine (COMPAZINE) 10 MG tablet Take 1 tablet (10 mg total) by mouth every 6 (six) hours as needed. 90 tablet 3  . rivaroxaban (XARELTO) 20 MG TABS tablet Take 1 tablet (20 mg total) by mouth daily with supper. 30 tablet 9  . simethicone (MYLICON) 378 MG chewable tablet Chew 125 mg by mouth every 6 (six) hours as needed for flatulence.    . traMADol (ULTRAM) 50 MG tablet Take 1 tablet (50 mg total) by mouth every 6 (six) hours as needed for moderate pain. 60 tablet 0   No current facility-administered medications for this visit.    PHYSICAL EXAMINATION: ECOG PERFORMANCE STATUS: 1 - Symptomatic but completely ambulatory  Vitals:   09/04/19 0834  BP: (!) 130/98  Pulse: 72  Resp: 18  Temp: 97.8 F (36.6 C)  SpO2: 98%   Filed Weights   09/04/19 0834  Weight: 181 lb 12.8 oz (82.5 kg)    GENERAL:alert, no distress and comfortable NEURO: alert & oriented x 3 with fluent speech, no focal motor/sensory deficits  LABORATORY DATA:  I have reviewed the data as listed    Component Value Date/Time   NA 142 09/03/2019 0835   K 3.2 (L) 09/03/2019 0835   CL 112 (H) 09/03/2019 5885  CO2 21 (L) 09/03/2019 0835   GLUCOSE 81 09/03/2019 0835   BUN 10 09/03/2019 0835   CREATININE 0.47 09/03/2019 0835   CREATININE 0.68 07/27/2017 0851   CALCIUM 7.0 (L) 09/03/2019 0835   PROT 5.3 (L) 09/03/2019 0835   ALBUMIN 2.6 (L) 09/03/2019 0835   AST 17 09/03/2019 0835   ALT 25 09/03/2019 0835   ALKPHOS 61 09/03/2019 0835   BILITOT <0.2 (L) 09/03/2019 0835   GFRNONAA >60 09/03/2019 0835   GFRNONAA 106 07/27/2017 0851   GFRAA >60 09/03/2019 0835   GFRAA 123 07/27/2017 0851    No results found for: SPEP, UPEP  Lab Results  Component Value Date   WBC 20.5 (H) 09/03/2019   NEUTROABS 16.3 (H) 09/03/2019   HGB 10.7 (L) 09/03/2019   HCT 33.8 (L) 09/03/2019   MCV 91.1 09/03/2019   PLT 599 (H) 09/03/2019      Chemistry      Component Value Date/Time    NA 142 09/03/2019 0835   K 3.2 (L) 09/03/2019 0835   CL 112 (H) 09/03/2019 0835   CO2 21 (L) 09/03/2019 0835   BUN 10 09/03/2019 0835   CREATININE 0.47 09/03/2019 0835   CREATININE 0.68 07/27/2017 0851      Component Value Date/Time   CALCIUM 7.0 (L) 09/03/2019 0835   ALKPHOS 61 09/03/2019 0835   AST 17 09/03/2019 0835   ALT 25 09/03/2019 0835   BILITOT <0.2 (L) 09/03/2019 0835       RADIOGRAPHIC STUDIES: I have reviewed multiple imaging studies with the patient I have personally reviewed the radiological images as listed and agreed with the findings in the report. CT CHEST W CONTRAST  Result Date: 09/03/2019 CLINICAL DATA:  44 year old female with history of pancreatic cancer. Evaluate response to therapy. EXAM: CT CHEST, ABDOMEN, AND PELVIS WITH CONTRAST TECHNIQUE: Multidetector CT imaging of the chest, abdomen and pelvis was performed following the standard protocol during bolus administration of intravenous contrast. CONTRAST:  174m OMNIPAQUE IOHEXOL 300 MG/ML  SOLN COMPARISON:  CT the chest, abdomen and pelvis 04/17/2019. FINDINGS: CT CHEST FINDINGS Cardiovascular: Heart size is normal. There is no significant pericardial fluid, thickening or pericardial calcification. No atherosclerotic calcifications in the thoracic aorta or the coronary arteries. Right internal jugular single-lumen porta cath with tip terminating in the right atrium. Mediastinum/Nodes: No pathologically enlarged mediastinal or hilar lymph nodes. Esophagus is unremarkable in appearance. No axillary lymphadenopathy. Lungs/Pleura: No suspicious pulmonary nodules or masses are noted. No acute consolidative airspace disease. No pleural effusions. Musculoskeletal: There are no aggressive appearing lytic or blastic lesions noted in the visualized portions of the skeleton. CT ABDOMEN PELVIS FINDINGS Hepatobiliary: In the periphery of segment 4A there is a 2.7 x 1.2 cm lesion which demonstrates peripheral nodular  hyperenhancement during arterial phase imaging with progressive centripetal filling, similar to the prior study, compatible with a cavernous hemangioma. No other suspicious cystic or solid hepatic lesions. No intra or extrahepatic biliary ductal dilatation. Status post cholecystectomy. Pancreas: Postoperative changes of distal pancreatectomy are again noted. Adjacent to the surgical site there is again an intermediate attenuation heterogeneously enhancing soft tissue mass which appears increased in size compared to the prior study, currently measuring 2.9 x 2.0 x 2.1 cm (axial image 95 of series 7 and coronal image 45 of series 3). This lesion makes contact with the left lateral aspect of both the celiac axis and the superior mesenteric arteries, and is immediately above the left renal vein in contact with the renal vein remaining  body and head of the pancreas are otherwise unremarkable in appearance. Spleen: Status post splenectomy. Adrenals/Urinary Tract: Nonobstructive calculi are noted within the collecting systems of both kidneys measuring 3 mm in the lower pole of the left kidney and 2 mm in the interpolar region of the right kidney. Bilateral kidneys and adrenal glands are otherwise normal. No hydroureteronephrosis. Urinary bladder is normal in appearance. Stomach/Bowel: Normal appearance of the stomach. No pathologic dilatation of small bowel or colon. Status post right hemicolectomy. Vascular/Lymphatic: Aortic atherosclerosis, without evidence of aneurysm or dissection in the abdominal or pelvic vasculature. No definite lymphadenopathy noted in the abdomen or pelvis. Reproductive: Status post hysterectomy. Ovaries are not confidently identified may be surgically absent or atrophic. Other: No significant volume of ascites.  No pneumoperitoneum. Musculoskeletal: There are no aggressive appearing lytic or blastic lesions noted in the visualized portions of the skeleton. IMPRESSION: 1. Findings are again highly  concerning for locally recurrent disease adjacent to the pancreatectomy bed, with enlarging soft tissue mass which is intimately associated with the superior mesenteric artery, celiac axis and superior aspect of the left renal vein, as detailed above. 2. No evidence of metastatic disease in the thorax. 3. 2.7 x 1.2 cm cavernous hemangioma in segment 4A of the liver again noted. 4. Small nonobstructive calculi in the collecting systems of both kidneys measuring up to 3 mm in the lower pole collecting system of the left kidney. 5. Aortic atherosclerosis. 6. Additional incidental findings, as above. Electronically Signed   By: Vinnie Langton M.D.   On: 09/03/2019 10:20   CT ABDOMEN PELVIS W CONTRAST  Result Date: 09/03/2019 CLINICAL DATA:  44 year old female with history of pancreatic cancer. Evaluate response to therapy. EXAM: CT CHEST, ABDOMEN, AND PELVIS WITH CONTRAST TECHNIQUE: Multidetector CT imaging of the chest, abdomen and pelvis was performed following the standard protocol during bolus administration of intravenous contrast. CONTRAST:  153m OMNIPAQUE IOHEXOL 300 MG/ML  SOLN COMPARISON:  CT the chest, abdomen and pelvis 04/17/2019. FINDINGS: CT CHEST FINDINGS Cardiovascular: Heart size is normal. There is no significant pericardial fluid, thickening or pericardial calcification. No atherosclerotic calcifications in the thoracic aorta or the coronary arteries. Right internal jugular single-lumen porta cath with tip terminating in the right atrium. Mediastinum/Nodes: No pathologically enlarged mediastinal or hilar lymph nodes. Esophagus is unremarkable in appearance. No axillary lymphadenopathy. Lungs/Pleura: No suspicious pulmonary nodules or masses are noted. No acute consolidative airspace disease. No pleural effusions. Musculoskeletal: There are no aggressive appearing lytic or blastic lesions noted in the visualized portions of the skeleton. CT ABDOMEN PELVIS FINDINGS Hepatobiliary: In the periphery  of segment 4A there is a 2.7 x 1.2 cm lesion which demonstrates peripheral nodular hyperenhancement during arterial phase imaging with progressive centripetal filling, similar to the prior study, compatible with a cavernous hemangioma. No other suspicious cystic or solid hepatic lesions. No intra or extrahepatic biliary ductal dilatation. Status post cholecystectomy. Pancreas: Postoperative changes of distal pancreatectomy are again noted. Adjacent to the surgical site there is again an intermediate attenuation heterogeneously enhancing soft tissue mass which appears increased in size compared to the prior study, currently measuring 2.9 x 2.0 x 2.1 cm (axial image 95 of series 7 and coronal image 45 of series 3). This lesion makes contact with the left lateral aspect of both the celiac axis and the superior mesenteric arteries, and is immediately above the left renal vein in contact with the renal vein remaining body and head of the pancreas are otherwise unremarkable in appearance. Spleen: Status post  splenectomy. Adrenals/Urinary Tract: Nonobstructive calculi are noted within the collecting systems of both kidneys measuring 3 mm in the lower pole of the left kidney and 2 mm in the interpolar region of the right kidney. Bilateral kidneys and adrenal glands are otherwise normal. No hydroureteronephrosis. Urinary bladder is normal in appearance. Stomach/Bowel: Normal appearance of the stomach. No pathologic dilatation of small bowel or colon. Status post right hemicolectomy. Vascular/Lymphatic: Aortic atherosclerosis, without evidence of aneurysm or dissection in the abdominal or pelvic vasculature. No definite lymphadenopathy noted in the abdomen or pelvis. Reproductive: Status post hysterectomy. Ovaries are not confidently identified may be surgically absent or atrophic. Other: No significant volume of ascites.  No pneumoperitoneum. Musculoskeletal: There are no aggressive appearing lytic or blastic lesions noted  in the visualized portions of the skeleton. IMPRESSION: 1. Findings are again highly concerning for locally recurrent disease adjacent to the pancreatectomy bed, with enlarging soft tissue mass which is intimately associated with the superior mesenteric artery, celiac axis and superior aspect of the left renal vein, as detailed above. 2. No evidence of metastatic disease in the thorax. 3. 2.7 x 1.2 cm cavernous hemangioma in segment 4A of the liver again noted. 4. Small nonobstructive calculi in the collecting systems of both kidneys measuring up to 3 mm in the lower pole collecting system of the left kidney. 5. Aortic atherosclerosis. 6. Additional incidental findings, as above. Electronically Signed   By: Vinnie Langton M.D.   On: 09/03/2019 10:20

## 2019-09-06 ENCOUNTER — Inpatient Hospital Stay: Payer: BC Managed Care – PPO

## 2019-09-07 DIAGNOSIS — E860 Dehydration: Secondary | ICD-10-CM | POA: Diagnosis not present

## 2019-09-07 NOTE — Progress Notes (Signed)
GI Location of Tumor / Histology: Tail of Pancreas  Dana Hicks presented   CT CAP 09/03/2019: Findings are again highly concerning for locally recurrent disease adjacent to the pancreatectomy bed, with enlarging soft tissue mass which is intimately associated with the superior mesenteric artery, celiac axis and superior aspect of the left renal vein.  No evidence of metastatic disease in the thorax.  2.7 x 1.2 cm cavernous hemangioma in segment 4A of the liver again noted.  Small non-obstructive calculi in the collecting systems of both kidneys measuring up to 3 mm in the lower pole collecting system of the left kidney.  Upper Endoscopic Ultrasound 06/07/2019: An irregular mass was identified in the region of the pancreatic tail resection site.  The mass was stellate with very poorly defined borders.  Fine needle aspiration for cytology was performed.  CT CAP 09/27/2017: 4 cm low-attenuation mass in the pancreatic tail, highly suspicious for pancreatic carcinoma.  This is caused splenic vein thrombosis, with new venous collaterals in the left upper quadrant.  Biopsies of Pancreas 06/07/2019   Past/Anticipated interventions by surgeon, if any:   11/24/2017: "Tail of pancreas and spleen", distal pancreatectomy and splenectomy- Done at [facility]  Past/Anticipated interventions by medical oncology, if any:  Dr. Alvy Bimler 09/04/2019 -Her CT scan from yesterday showed mild disease progression but no other distant metastatic disease -The area of progression is very ill-defined -We discussed the risk and benefits of radiation therapy and she is in agreement for the referral. -I recommend radiation now versus further systemic chemotherapy to spare her from side effects of treatment and to try to target the disease from a different modality standpoint. -Chemotherapy FOLFIRINOX 4/26-6/22/2021 -When she has completed radiation therapy, I plan to wait approximately 6 to 8 weeks before repeating CT  imaging -We have extensive discussions about the goals of care and the role of radiation therapy and palliative chemotherapy   Weight changes, if any: Lost about 80 pounds since the beginning of this, has gained about 10 pounds back.  Bowel/Bladder complaints, if any: no  Nausea / Vomiting, if any: No  Pain issues, if any:  Has back pain    SAFETY ISSUES:  Prior radiation? no  Pacemaker/ICD? No  Possible current pregnancy? Hysterectomy and right oophorectomy  Is the patient on methotrexate? 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   -History of Endometrial Cancer 07/2017

## 2019-09-10 DIAGNOSIS — E860 Dehydration: Secondary | ICD-10-CM | POA: Diagnosis not present

## 2019-09-11 ENCOUNTER — Encounter: Payer: Self-pay | Admitting: Radiation Oncology

## 2019-09-11 ENCOUNTER — Ambulatory Visit
Admission: RE | Admit: 2019-09-11 | Discharge: 2019-09-11 | Disposition: A | Discharge status: Home / Self Care | Payer: BC Managed Care – PPO | Source: Ambulatory Visit | Attending: Radiation Oncology | Admitting: Radiation Oncology

## 2019-09-11 ENCOUNTER — Other Ambulatory Visit: Payer: Self-pay

## 2019-09-11 ENCOUNTER — Ambulatory Visit: Payer: BC Managed Care – PPO | Admitting: Radiation Oncology

## 2019-09-11 VITALS — BP 134/83 | HR 76 | Temp 99.1°F | Resp 18 | Ht 62.0 in | Wt 180.2 lb

## 2019-09-11 DIAGNOSIS — E119 Type 2 diabetes mellitus without complications: Secondary | ICD-10-CM | POA: Diagnosis not present

## 2019-09-11 DIAGNOSIS — C561 Malignant neoplasm of right ovary: Secondary | ICD-10-CM | POA: Diagnosis not present

## 2019-09-11 DIAGNOSIS — K219 Gastro-esophageal reflux disease without esophagitis: Secondary | ICD-10-CM | POA: Diagnosis not present

## 2019-09-11 DIAGNOSIS — Z90722 Acquired absence of ovaries, bilateral: Secondary | ICD-10-CM | POA: Diagnosis not present

## 2019-09-11 DIAGNOSIS — Z79899 Other long term (current) drug therapy: Secondary | ICD-10-CM | POA: Insufficient documentation

## 2019-09-11 DIAGNOSIS — Z87891 Personal history of nicotine dependence: Secondary | ICD-10-CM | POA: Diagnosis not present

## 2019-09-11 DIAGNOSIS — Z8543 Personal history of malignant neoplasm of ovary: Secondary | ICD-10-CM | POA: Diagnosis not present

## 2019-09-11 DIAGNOSIS — Z8542 Personal history of malignant neoplasm of other parts of uterus: Secondary | ICD-10-CM | POA: Insufficient documentation

## 2019-09-11 DIAGNOSIS — C252 Malignant neoplasm of tail of pancreas: Secondary | ICD-10-CM | POA: Insufficient documentation

## 2019-09-11 DIAGNOSIS — G893 Neoplasm related pain (acute) (chronic): Secondary | ICD-10-CM | POA: Diagnosis not present

## 2019-09-11 DIAGNOSIS — Z8614 Personal history of Methicillin resistant Staphylococcus aureus infection: Secondary | ICD-10-CM | POA: Insufficient documentation

## 2019-09-11 DIAGNOSIS — N200 Calculus of kidney: Secondary | ICD-10-CM | POA: Diagnosis not present

## 2019-09-11 DIAGNOSIS — M069 Rheumatoid arthritis, unspecified: Secondary | ICD-10-CM | POA: Insufficient documentation

## 2019-09-11 DIAGNOSIS — Z7984 Long term (current) use of oral hypoglycemic drugs: Secondary | ICD-10-CM | POA: Diagnosis not present

## 2019-09-11 DIAGNOSIS — F329 Major depressive disorder, single episode, unspecified: Secondary | ICD-10-CM | POA: Diagnosis not present

## 2019-09-11 DIAGNOSIS — I700 Atherosclerosis of aorta: Secondary | ICD-10-CM | POA: Insufficient documentation

## 2019-09-11 DIAGNOSIS — Z9889 Other specified postprocedural states: Secondary | ICD-10-CM | POA: Diagnosis not present

## 2019-09-11 DIAGNOSIS — Z87442 Personal history of urinary calculi: Secondary | ICD-10-CM | POA: Diagnosis not present

## 2019-09-11 DIAGNOSIS — Z1509 Genetic susceptibility to other malignant neoplasm: Secondary | ICD-10-CM | POA: Diagnosis not present

## 2019-09-11 DIAGNOSIS — Z85038 Personal history of other malignant neoplasm of large intestine: Secondary | ICD-10-CM | POA: Insufficient documentation

## 2019-09-11 DIAGNOSIS — Z90710 Acquired absence of both cervix and uterus: Secondary | ICD-10-CM | POA: Insufficient documentation

## 2019-09-11 DIAGNOSIS — Z800 Family history of malignant neoplasm of digestive organs: Secondary | ICD-10-CM | POA: Diagnosis not present

## 2019-09-11 DIAGNOSIS — Z7901 Long term (current) use of anticoagulants: Secondary | ICD-10-CM | POA: Diagnosis not present

## 2019-09-11 NOTE — Addendum Note (Signed)
Encounter addended by: Kyung Rudd, MD on: 09/11/2019 9:26 AM  Actions taken: Problem List modified

## 2019-09-11 NOTE — Progress Notes (Addendum)
Radiation Oncology         [phone] ________________________________  Name: Dana Hicks        MRN: [id number]  Date of Service: 09/11/2019 DOB: 03/22/1975  CC:[name], MD  Heath Lark, MD     REFERRING PHYSICIAN: Heath Lark, MD   DIAGNOSIS: The encounter diagnosis was Malignant neoplasm of tail of pancreas ([place]).   HISTORY OF PRESENT ILLNESS: Dana Hicks is a 44 y.o. female seen at the request of Dr. Alvy Bimler for a diagnosis of pancreas cancer.  The patient has a history of endometrial adenocarcinoma involving the endometrium and, found to have Lynch syndrome related to an MSH2 mutation.  She was originally diagnosed with her GYN malignancy in the summer 2019, underwent surgical resection, and following this, was also identified as having a mass in the tail of the pancreas in late summer 2019, under endoscopic ultrasound guided biopsy confirmed adenocarcinoma.  She also underwent distal pancreatectomy and splenectomy on 11/24/2017 revealing a 2.5 cm invasive adenocarcinoma that was moderate to poorly differentiated with focal signet ring features, 12 benign lymph nodes were removed.  She was started on FOLFIRINOX in November 2019, which has continued.  Recent imaging in April 2021 revealed progression of an ill-defined soft tissue to the left of the celiac access measuring 1.9 x 1.9 cm abutting the celiac aspect of access, SMA and posterior wall of the mid stomach, there was also bandlike soft tissue in the anterior abdomen, and an ill-defined subtle area of decreased attenuation in the posterior aspect of segment 4 in the liver.  She underwent an EGD with endoscopic ultrasound on 06/07/2019 a fine-needle aspirate of the lesion seen on imaging was consistent with malignant cells, adenocarcinoma.  She has continued with systemic FOLFIRINOX and given her mild disease progression without other distant sites of disease, the patient is interested in meeting to talk about radiation rather  than continuing systemic chemotherapy.  Dr. Alvy Bimler plans to follow-up with repeat imaging about 2 to 3 months after completion of radiotherapy.     PREVIOUS RADIATION THERAPY: No   PAST MEDICAL HISTORY:  Past Medical History:  Diagnosis Date  . Allergic rhinitis, seasonal   . Cecal cancer ([place]) 2001   Stage II (T3N0)  04-11-2001cecum-partial colectomy and completed chemo 2002  . Depression   . Diabetes mellitus without complication ([place])   . Endometrial cancer ([place]) 08/2017   Stage IA, Grade 1  . GERD (gastroesophageal reflux disease)   . History of MRSA infection 01/2017   followed by infectious disease center--  recurrent pustular folliculitis  . Nephrolithiasis    bilateral nonobstructive calculi per CT 08-18-2017  . OA (osteoarthritis)   . Ovarian cancer, right ([place]) 08/2017   Stage II Grade 2 Endometrioid  . Pancreatic cancer ([place])   . Rheumatoid arthritis [facility])    rheumatologist-  dr devenswar-- treated w/ oral prednisone daily and methotrexate injection every 3 wks       PAST SURGICAL HISTORY: Past Surgical History:  Procedure Laterality Date  . BREAST LUMPECTOMY WITH RADIOACTIVE SEED LOCALIZATION Left 06/28/2014   Benign Procedure: RADIOACTIVE SEED LOCALIZATION LEFT BREAST LUMPECTOMY;  Surgeon: Excell Seltzer, MD;  Location: [facility];  Service: General;  Laterality: Left;  . COLONOSCOPY  06/19/14  . ESOPHAGOGASTRODUODENOSCOPY (EGD) WITH PROPOFOL N/A 06/07/2019   Procedure: ESOPHAGOGASTRODUODENOSCOPY (EGD) WITH PROPOFOL;  Surgeon: [name], MD;  Location: [facility];  Service: Endoscopy;  Laterality: N/A;  . EUS N/A 06/07/2019   Procedure: UPPER ENDOSCOPIC  ULTRASOUND (EUS) LINEAR;  Surgeon: [name], MD;  Location: [facility];  Service: Endoscopy;  Laterality: N/A;  . EYE SURGERY Left    plug in tear duct  . FINE NEEDLE ASPIRATION N/A 06/07/2019   Procedure: FINE NEEDLE ASPIRATION (FNA) LINEAR;  Surgeon: [name], MD;   Location: [facility];  Service: Endoscopy;  Laterality: N/A;  . IR IMAGING GUIDED PORT INSERTION  09/26/2017  . KNEE ARTHROSCOPY    . PANCREATECTOMY  11/25/2017  . PARTIAL KNEE ARTHROPLASTY Left 11/27/2018   Procedure: UNICOMPARTMENTAL KNEE;  Surgeon: Vickey Huger, MD;  Location: [facility];  Service: Orthopedics;  Laterality: Left;  . PORT A CATH REVISION  2001   in and out  . RIGHT COLECTOMY  06/02/2009   cecum cancer  . ROBOTIC ASSISTED TOTAL HYSTERECTOMY WITH BILATERAL SALPINGO OOPHERECTOMY Bilateral 08/30/2017   Procedure: XI ROBOTIC ASSISTED TOTAL  HYSTERECTOMY BILATERAL  SALPINGO OOPHORECTOMY; LYSIS OF ADHESIONS;  Surgeon: Isabel Caprice, MD;  Location: [facility];  Service: Gynecology;  Laterality: Bilateral;  . SPLENECTOMY, PARTIAL  11/25/2017     FAMILY HISTORY:  Family History  Problem Relation Age of Onset  . Arthritis/Rheumatoid Mother   . Uterine cancer Mother 52  . Allergic rhinitis Father   . Heart disease Father   . Drug abuse Son   . Other Maternal Aunt        Unknown GYN CA  . Colon cancer Maternal Uncle 59  . Rectal cancer Maternal Uncle   . Diabetes Maternal Grandmother   . Cancer Maternal Grandfather        d. 46s of liver/lung cancer  . Angioedema Neg Hx   . Asthma Neg Hx   . Atopy Neg Hx   . Eczema Neg Hx   . Immunodeficiency Neg Hx   . Urticaria Neg Hx   . Esophageal cancer Neg Hx   . Stomach cancer Neg Hx      SOCIAL HISTORY:  reports that she quit smoking about 21 years ago. Her smoking use included cigarettes. She has a 5.00 pack-year smoking history. She has never used smokeless tobacco. She reports previous alcohol use. She reports that she does not use drugs.   ALLERGIES: Codeine, Penicillins, and Bactrim [sulfamethoxazole-trimethoprim]   MEDICATIONS:  Current Outpatient Medications  Medication Sig Dispense Refill  . cefixime (SUPRAX) 400 MG CAPS capsule Take 1 capsule (400 mg total) by mouth daily. 10 capsule 0  . cetirizine (ZYRTEC) 10 MG  tablet Take 10 mg by mouth daily.    . citalopram (CELEXA) 20 MG tablet TAKE 1 TABLET BY MOUTH EVERY DAY 90 tablet 4  . CREON 24000-76000 units CPEP Take 1 capsule by mouth See admin instructions. Take 1 capsule before each meal and 1 capsule after each meal, take 1 cap with snacks    . dexamethasone (DECADRON) 4 MG tablet Take 1 tablet (4 mg total) by mouth daily. 30 tablet 1  . diclofenac Sodium (VOLTAREN) 1 % GEL APPLY 2-4 GRAMS TO AFFECTED JOINT 4 TIMES DAILY AS NEEDED. 400 g 1  . diphenoxylate-atropine (LOMOTIL) 2.5-0.025 MG tablet Take 1 tablet by mouth 4 (four) times daily as needed for diarrhea or loose stools. 60 tablet 0  . estradiol (ESTRACE VAGINAL) 0.1 MG/GM vaginal cream Place 1 Applicatorful vaginally 3 (three) times a week. 42.5 g 12  . fluticasone (FLONASE) 50 MCG/ACT nasal spray Place 1 spray into both nostrils daily.    Marland Kitchen gabapentin (NEURONTIN) 300 MG capsule Take 1 capsule (300 mg total)  by mouth 3 (three) times daily. 90 capsule 11  . HYDROmorphone (DILAUDID) 8 MG tablet Take 1 tablet (8 mg total) by mouth every 6 (six) hours as needed for severe pain. 90 tablet 0  . hydrOXYzine (ATARAX/VISTARIL) 10 MG tablet Take 1 tablet (10 mg total) by mouth 3 (three) times daily as needed. 30 tablet 1  . lidocaine-prilocaine (EMLA) cream Apply 1 application topically daily as needed (port). Apply to affected area once 30 g 11  . LORazepam (ATIVAN) 0.5 MG tablet Take 1 tablet (0.5 mg total) by mouth every 8 (eight) hours as needed for anxiety (nausea). 30 tablet 0  . metFORMIN (GLUCOPHAGE) 500 MG tablet TAKE 1 TABLET (500 MG TOTAL) BY MOUTH 2 (TWO) TIMES DAILY WITH A MEAL. (Patient taking differently: Take 500 mg by mouth 2 (two) times daily with a meal. ) 60 tablet 1  . methadone (DOLOPHINE) 10 MG tablet Take 2 tablets (20 mg total) by mouth every 12 (twelve) hours. 60 tablet 0  . methocarbamol (ROBAXIN) 500 MG tablet TAKE 1 TABLET BY MOUTH 3 TIMES DAILY AS NEEDED FOR MUSCLE SPASMS. (Patient  taking differently: Take 500 mg by mouth every 8 (eight) hours as needed for muscle spasms. ) 90 tablet 0  . Multiple Vitamin (MULTIVITAMIN WITH MINERALS) TABS tablet Take 1 tablet by mouth daily.    Marland Kitchen omeprazole (PRILOSEC) 40 MG capsule Take 1 capsule (40 mg total) by mouth daily. 30 capsule 11  . ondansetron (ZOFRAN) 8 MG tablet TAKE 1 TAB BY MOUTH EVERY 8HOURS AS NEEDED FOR REFRACTORY NAUSEA/VOMITING START ON DAY 3AFTER CHEMO 8 tablet 7  . predniSONE (DELTASONE) 1 MG tablet Take 4 tablets (4 mg total) by mouth daily with breakfast. 360 tablet 1  . prochlorperazine (COMPAZINE) 10 MG tablet Take 1 tablet (10 mg total) by mouth every 6 (six) hours as needed. 90 tablet 3  . rivaroxaban (XARELTO) 20 MG TABS tablet Take 1 tablet (20 mg total) by mouth daily with supper. 30 tablet 9  . simethicone (MYLICON) 676 MG chewable tablet Chew 125 mg by mouth every 6 (six) hours as needed for flatulence.    . traMADol (ULTRAM) 50 MG tablet Take 1 tablet (50 mg total) by mouth every 6 (six) hours as needed for moderate pain. 60 tablet 0   No current facility-administered medications for this encounter.     REVIEW OF SYSTEMS: On review of systems, the patient reports that she is doing well overall. She reports her abdominal/mid back discomfort is well managed with a heating pad or 4 mg of Dilaudid. She continues to have difficulty with her pancreatic insufficiency and takes Creon for this. She has used lomotil as well at times. She denies any chest pain, shortness of breath, cough, fevers, chills, night sweats, unintended weight changes. She denies any bladder disturbances, and denies abdominal pain, nausea or vomiting. She denies any new musculoskeletal or joint aches or pains but has a history of RA and OA and has been taking Prednisone under the care of Dr. Garen Grams in rheumatology and has been off of methotrexate for many months. A complete review of systems is obtained and is otherwise negative.      PHYSICAL EXAM:  Wt Readings from Last 3 Encounters:  09/11/19 180 lb 3.2 oz (81.7 kg)  09/04/19 181 lb 12.8 oz (82.5 kg)  08/13/19 175 lb (79.4 kg)   Temp Readings from Last 3 Encounters:  09/11/19 99.1 F (37.3 C) (Oral)  09/04/19 97.8 F (36.6 C) (Oral)  08/16/19 98.1 F (36.7 C) (Oral)   BP Readings from Last 3 Encounters:  09/11/19 134/83  09/04/19 (!) 130/98  08/16/19 127/76   Pulse Readings from Last 3 Encounters:  09/11/19 76  09/04/19 72  08/16/19 66   Pain Assessment Pain Score: 0-No pain/10  In general this is a well appearing caucasian female in no acute distress. She's alert and oriented x4 and appropriate throughout the examination. Cardiopulmonary assessment is negative for acute distress and she exhibits normal effort.    ECOG = 1  0 - Asymptomatic (Fully active, able to carry on all predisease activities without restriction)  1 - Symptomatic but completely ambulatory (Restricted in physically strenuous activity but ambulatory and able to carry out work of a light or sedentary nature. For example, light housework, office work)  2 - Symptomatic, <50% in bed during the day (Ambulatory and capable of all self care but unable to carry out any work activities. Up and about more than 50% of waking hours)  3 - Symptomatic, >50% in bed, but not bedbound (Capable of only limited self-care, confined to bed or chair 50% or more of waking hours)  4 - Bedbound (Completely disabled. Cannot carry on any self-care. Totally confined to bed or chair)  5 - Death   Eustace Pen MM, Creech RH, Tormey DC, et al. [phone]). "Toxicity and response criteria of the [facility]". [place] Oncol. 5 (6): 649-55    LABORATORY DATA:  Lab Results  Component Value Date   WBC 20.5 (H) 09/03/2019   HGB 10.7 (L) 09/03/2019   HCT 33.8 (L) 09/03/2019   MCV 91.1 09/03/2019   PLT 599 (H) 09/03/2019   Lab Results  Component Value Date   NA 142 09/03/2019   K 3.2  (L) 09/03/2019   CL 112 (H) 09/03/2019   CO2 21 (L) 09/03/2019   Lab Results  Component Value Date   ALT 25 09/03/2019   AST 17 09/03/2019   ALKPHOS 61 09/03/2019   BILITOT <0.2 (L) 09/03/2019      RADIOGRAPHY: CT CHEST W CONTRAST  Result Date: 09/03/2019 CLINICAL DATA:  44 year old female with history of pancreatic cancer. Evaluate response to therapy. EXAM: CT CHEST, ABDOMEN, AND PELVIS WITH CONTRAST TECHNIQUE: Multidetector CT imaging of the chest, abdomen and pelvis was performed following the standard protocol during bolus administration of intravenous contrast. CONTRAST:  131m OMNIPAQUE IOHEXOL 300 MG/ML  SOLN COMPARISON:  CT the chest, abdomen and pelvis 04/17/2019. FINDINGS: CT CHEST FINDINGS Cardiovascular: Heart size is normal. There is no significant pericardial fluid, thickening or pericardial calcification. No atherosclerotic calcifications in the thoracic aorta or the coronary arteries. Right internal jugular single-lumen porta cath with tip terminating in the right atrium. Mediastinum/Nodes: No pathologically enlarged mediastinal or hilar lymph nodes. Esophagus is unremarkable in appearance. No axillary lymphadenopathy. Lungs/Pleura: No suspicious pulmonary nodules or masses are noted. No acute consolidative airspace disease. No pleural effusions. Musculoskeletal: There are no aggressive appearing lytic or blastic lesions noted in the visualized portions of the skeleton. CT ABDOMEN PELVIS FINDINGS Hepatobiliary: In the periphery of segment 4A there is a 2.7 x 1.2 cm lesion which demonstrates peripheral nodular hyperenhancement during arterial phase imaging with progressive centripetal filling, similar to the prior study, compatible with a cavernous hemangioma. No other suspicious cystic or solid hepatic lesions. No intra or extrahepatic biliary ductal dilatation. Status post cholecystectomy. Pancreas: Postoperative changes of distal pancreatectomy are again noted. Adjacent to the  surgical site there is again an intermediate  attenuation heterogeneously enhancing soft tissue mass which appears increased in size compared to the prior study, currently measuring 2.9 x 2.0 x 2.1 cm (axial image 95 of series 7 and coronal image 45 of series 3). This lesion makes contact with the left lateral aspect of both the celiac axis and the superior mesenteric arteries, and is immediately above the left renal vein in contact with the renal vein remaining body and head of the pancreas are otherwise unremarkable in appearance. Spleen: Status post splenectomy. Adrenals/Urinary Tract: Nonobstructive calculi are noted within the collecting systems of both kidneys measuring 3 mm in the lower pole of the left kidney and 2 mm in the interpolar region of the right kidney. Bilateral kidneys and adrenal glands are otherwise normal. No hydroureteronephrosis. Urinary bladder is normal in appearance. Stomach/Bowel: Normal appearance of the stomach. No pathologic dilatation of small bowel or colon. Status post right hemicolectomy. Vascular/Lymphatic: Aortic atherosclerosis, without evidence of aneurysm or dissection in the abdominal or pelvic vasculature. No definite lymphadenopathy noted in the abdomen or pelvis. Reproductive: Status post hysterectomy. Ovaries are not confidently identified may be surgically absent or atrophic. Other: No significant volume of ascites.  No pneumoperitoneum. Musculoskeletal: There are no aggressive appearing lytic or blastic lesions noted in the visualized portions of the skeleton. IMPRESSION: 1. Findings are again highly concerning for locally recurrent disease adjacent to the pancreatectomy bed, with enlarging soft tissue mass which is intimately associated with the superior mesenteric artery, celiac axis and superior aspect of the left renal vein, as detailed above. 2. No evidence of metastatic disease in the thorax. 3. 2.7 x 1.2 cm cavernous hemangioma in segment 4A of the liver again  noted. 4. Small nonobstructive calculi in the collecting systems of both kidneys measuring up to 3 mm in the lower pole collecting system of the left kidney. 5. Aortic atherosclerosis. 6. Additional incidental findings, as above. Electronically Signed   By: Vinnie Langton M.D.   On: 09/03/2019 10:20   CT ABDOMEN PELVIS W CONTRAST  Result Date: 09/03/2019 CLINICAL DATA:  44 year old female with history of pancreatic cancer. Evaluate response to therapy. EXAM: CT CHEST, ABDOMEN, AND PELVIS WITH CONTRAST TECHNIQUE: Multidetector CT imaging of the chest, abdomen and pelvis was performed following the standard protocol during bolus administration of intravenous contrast. CONTRAST:  159m OMNIPAQUE IOHEXOL 300 MG/ML  SOLN COMPARISON:  CT the chest, abdomen and pelvis 04/17/2019. FINDINGS: CT CHEST FINDINGS Cardiovascular: Heart size is normal. There is no significant pericardial fluid, thickening or pericardial calcification. No atherosclerotic calcifications in the thoracic aorta or the coronary arteries. Right internal jugular single-lumen porta cath with tip terminating in the right atrium. Mediastinum/Nodes: No pathologically enlarged mediastinal or hilar lymph nodes. Esophagus is unremarkable in appearance. No axillary lymphadenopathy. Lungs/Pleura: No suspicious pulmonary nodules or masses are noted. No acute consolidative airspace disease. No pleural effusions. Musculoskeletal: There are no aggressive appearing lytic or blastic lesions noted in the visualized portions of the skeleton. CT ABDOMEN PELVIS FINDINGS Hepatobiliary: In the periphery of segment 4A there is a 2.7 x 1.2 cm lesion which demonstrates peripheral nodular hyperenhancement during arterial phase imaging with progressive centripetal filling, similar to the prior study, compatible with a cavernous hemangioma. No other suspicious cystic or solid hepatic lesions. No intra or extrahepatic biliary ductal dilatation. Status post cholecystectomy.  Pancreas: Postoperative changes of distal pancreatectomy are again noted. Adjacent to the surgical site there is again an intermediate attenuation heterogeneously enhancing soft tissue mass which appears increased in size compared to the  prior study, currently measuring 2.9 x 2.0 x 2.1 cm (axial image 95 of series 7 and coronal image 45 of series 3). This lesion makes contact with the left lateral aspect of both the celiac axis and the superior mesenteric arteries, and is immediately above the left renal vein in contact with the renal vein remaining body and head of the pancreas are otherwise unremarkable in appearance. Spleen: Status post splenectomy. Adrenals/Urinary Tract: Nonobstructive calculi are noted within the collecting systems of both kidneys measuring 3 mm in the lower pole of the left kidney and 2 mm in the interpolar region of the right kidney. Bilateral kidneys and adrenal glands are otherwise normal. No hydroureteronephrosis. Urinary bladder is normal in appearance. Stomach/Bowel: Normal appearance of the stomach. No pathologic dilatation of small bowel or colon. Status post right hemicolectomy. Vascular/Lymphatic: Aortic atherosclerosis, without evidence of aneurysm or dissection in the abdominal or pelvic vasculature. No definite lymphadenopathy noted in the abdomen or pelvis. Reproductive: Status post hysterectomy. Ovaries are not confidently identified may be surgically absent or atrophic. Other: No significant volume of ascites.  No pneumoperitoneum. Musculoskeletal: There are no aggressive appearing lytic or blastic lesions noted in the visualized portions of the skeleton. IMPRESSION: 1. Findings are again highly concerning for locally recurrent disease adjacent to the pancreatectomy bed, with enlarging soft tissue mass which is intimately associated with the superior mesenteric artery, celiac axis and superior aspect of the left renal vein, as detailed above. 2. No evidence of metastatic  disease in the thorax. 3. 2.7 x 1.2 cm cavernous hemangioma in segment 4A of the liver again noted. 4. Small nonobstructive calculi in the collecting systems of both kidneys measuring up to 3 mm in the lower pole collecting system of the left kidney. 5. Aortic atherosclerosis. 6. Additional incidental findings, as above. Electronically Signed   By: Vinnie Langton M.D.   On: 09/03/2019 10:20       IMPRESSION/PLAN: 1. Adenocarcinoma of the tail of the pancreas in the setting of Lynch Syndrome who also has primary ovarian and primary endometrial cancer. Dr. Lisbeth Renshaw discusses the pathology findings and reviews the nature of pancreatic cancer. Dr. Lisbeth Renshaw outlines the multiple approaches that can be given for radiotherapy for this disease. In her situation, Dr. Lisbeth Renshaw recommends a course of stereotactic body radiotherapy (SBRT). We discussed the risks, benefits, short, and long term effects of radiotherapy, and the patient is interested in proceeding. Dr. Lisbeth Renshaw discusses the delivery and logistics of radiotherapy and anticipates a course of 10 fractions of SBRT Style therapy given the proximity to the stomach rather than a 5 fraction regimen. Written consent is obtained and placed in the chart, a copy was provided to the patient. She will simulate tomorrow at 1pm.  2. Pain secondary to #1. The patient will continue taking her Dilaudid as needed 3. Contraceptive Counseling. The patient has undergone hysterectomy due to her endometrial malignancy. No pregnancy testing is needed prior to radiotherapy. 4. Rheumatoid Arthritis. The patient can continue taking prednisone during therapy and reports she needs a follow up with Dr. Garen Grams. She is aware we would like to avoid methotrexate during the course of radiotherapy.  In a visit lasting 60 minutes, greater than 50% of the time was spent face to face discussing the patient's condition, in preparation for the discussion, and coordinating the patient's care.   The  above documentation reflects my direct findings during this shared patient visit. Please see the separate note by Dr. Lisbeth Renshaw on this date for the remainder  of the patient's plan of care.    Carola Rhine, PAC

## 2019-09-12 ENCOUNTER — Ambulatory Visit
Admission: RE | Admit: 2019-09-12 | Discharge: 2019-09-12 | Disposition: A | Discharge status: Home / Self Care | Payer: BC Managed Care – PPO | Source: Ambulatory Visit | Attending: Radiation Oncology | Admitting: Radiation Oncology

## 2019-09-12 ENCOUNTER — Other Ambulatory Visit: Payer: Self-pay

## 2019-09-12 DIAGNOSIS — C252 Malignant neoplasm of tail of pancreas: Secondary | ICD-10-CM | POA: Diagnosis not present

## 2019-09-12 DIAGNOSIS — E860 Dehydration: Secondary | ICD-10-CM | POA: Diagnosis not present

## 2019-09-13 ENCOUNTER — Telehealth: Payer: Self-pay | Admitting: *Deleted

## 2019-09-13 NOTE — Telephone Encounter (Signed)
[place] Psychosocial Distress Screening Clinical Social Work  Clinical Social Work was referred by distress screening protocol.  The patient scored a 8 on the Psychosocial Distress Thermometer which indicates severe distress. Clinical Social Worker contacted patient by phone to assess for distress and other psychosocial needs. Patient reported feeling "fine" and having no distress, questions or concerns. CSW briefly shared CSW role and programs available at cancer center.  CSW encouraged patient to follow up as needed.  ON[facility] DISTRESS SCREENING 09/11/2019  Screening Type Initial Screening  Distress experienced in past week (1-10) 8  Information Concerns Type Lack of info about treatment  Other Contact via phone    Clinical Social Worker follow up needed: No.  If yes, follow up plan:  Gwinda Maine, LCSW

## 2019-09-18 ENCOUNTER — Telehealth: Payer: Self-pay | Admitting: Hematology and Oncology

## 2019-09-18 NOTE — Telephone Encounter (Signed)
Scheduled per 7/26 sch msg. Called and spoke with pt, confirmed 8/10 appt

## 2019-09-20 DIAGNOSIS — C252 Malignant neoplasm of tail of pancreas: Secondary | ICD-10-CM | POA: Diagnosis not present

## 2019-09-21 ENCOUNTER — Other Ambulatory Visit: Payer: Self-pay | Admitting: Rheumatology

## 2019-09-21 MED ORDER — PREDNISONE 5 MG PO TABS
10.0000 mg | ORAL_TABLET | Freq: Every day | ORAL | 0 refills | Status: DC
Start: 2019-09-21 — End: 2019-10-22

## 2019-09-21 NOTE — Telephone Encounter (Signed)
Patient has been on Chemo which kept RA in check. (Chemo ended 5 weeks ago)  But now starting on Radiation for two weeks. Patient wanted to know if Prednisone can be upped until she gets off Radiation. She is only taking 4 mg of Prednisone right now. Patient is in quite a bit of pain, and legs are very stiff. Patient uses [facility] on [address]. Please call to advise.

## 2019-09-21 NOTE — Addendum Note (Signed)
Addended by: Carole Binning on: 09/21/2019 01:51 PM   Modules accepted: Orders

## 2019-09-21 NOTE — Telephone Encounter (Signed)
Patient advised prednisone 5 mg tablet, 2 tablets p.o. q. a.m.  As her symptoms improve she can taper it down to prednisone 4 mg p.o. daily. Patient states she will keep [place] updated.

## 2019-09-21 NOTE — Telephone Encounter (Signed)
Please call in prednisone 5 mg tablet, 2 tablets p.o. q. a.m.  As her symptoms improve she can taper it down to prednisone 4 mg p.o. daily.

## 2019-09-24 ENCOUNTER — Other Ambulatory Visit: Payer: Self-pay

## 2019-09-24 ENCOUNTER — Ambulatory Visit
Admission: RE | Admit: 2019-09-24 | Discharge: 2019-09-24 | Disposition: A | Discharge status: Home / Self Care | Payer: BC Managed Care – PPO | Source: Ambulatory Visit | Attending: Radiation Oncology | Admitting: Radiation Oncology

## 2019-09-24 DIAGNOSIS — C252 Malignant neoplasm of tail of pancreas: Secondary | ICD-10-CM | POA: Diagnosis not present

## 2019-09-25 ENCOUNTER — Ambulatory Visit
Admission: RE | Admit: 2019-09-25 | Discharge: 2019-09-25 | Disposition: A | Discharge status: Home / Self Care | Payer: BC Managed Care – PPO | Source: Ambulatory Visit | Attending: Radiation Oncology | Admitting: Radiation Oncology

## 2019-09-25 ENCOUNTER — Other Ambulatory Visit: Payer: Self-pay

## 2019-09-25 DIAGNOSIS — C252 Malignant neoplasm of tail of pancreas: Secondary | ICD-10-CM | POA: Diagnosis not present

## 2019-09-26 ENCOUNTER — Ambulatory Visit
Admission: RE | Admit: 2019-09-26 | Discharge: 2019-09-26 | Disposition: A | Discharge status: Home / Self Care | Payer: BC Managed Care – PPO | Source: Ambulatory Visit | Attending: Radiation Oncology | Admitting: Radiation Oncology

## 2019-09-26 ENCOUNTER — Other Ambulatory Visit: Payer: Self-pay

## 2019-09-26 DIAGNOSIS — C252 Malignant neoplasm of tail of pancreas: Secondary | ICD-10-CM | POA: Diagnosis not present

## 2019-09-27 ENCOUNTER — Inpatient Hospital Stay: Payer: BC Managed Care – PPO | Attending: Hematology and Oncology

## 2019-09-27 ENCOUNTER — Encounter: Payer: Self-pay | Admitting: Rheumatology

## 2019-09-27 ENCOUNTER — Ambulatory Visit
Admission: RE | Admit: 2019-09-27 | Discharge: 2019-09-27 | Disposition: A | Discharge status: Home / Self Care | Payer: BC Managed Care – PPO | Source: Ambulatory Visit | Attending: Radiation Oncology | Admitting: Radiation Oncology

## 2019-09-27 ENCOUNTER — Encounter: Payer: Self-pay | Admitting: Hematology and Oncology

## 2019-09-27 ENCOUNTER — Other Ambulatory Visit: Payer: Self-pay

## 2019-09-27 ENCOUNTER — Other Ambulatory Visit: Payer: Self-pay | Admitting: Hematology and Oncology

## 2019-09-27 DIAGNOSIS — C541 Malignant neoplasm of endometrium: Secondary | ICD-10-CM | POA: Insufficient documentation

## 2019-09-27 DIAGNOSIS — N390 Urinary tract infection, site not specified: Secondary | ICD-10-CM

## 2019-09-27 DIAGNOSIS — C252 Malignant neoplasm of tail of pancreas: Secondary | ICD-10-CM

## 2019-09-27 DIAGNOSIS — Z7984 Long term (current) use of oral hypoglycemic drugs: Secondary | ICD-10-CM | POA: Insufficient documentation

## 2019-09-27 DIAGNOSIS — M069 Rheumatoid arthritis, unspecified: Secondary | ICD-10-CM | POA: Insufficient documentation

## 2019-09-27 DIAGNOSIS — D1803 Hemangioma of intra-abdominal structures: Secondary | ICD-10-CM | POA: Diagnosis not present

## 2019-09-27 DIAGNOSIS — Z79899 Other long term (current) drug therapy: Secondary | ICD-10-CM | POA: Insufficient documentation

## 2019-09-27 DIAGNOSIS — Z452 Encounter for adjustment and management of vascular access device: Secondary | ICD-10-CM | POA: Diagnosis not present

## 2019-09-27 DIAGNOSIS — C7961 Secondary malignant neoplasm of right ovary: Secondary | ICD-10-CM | POA: Insufficient documentation

## 2019-09-27 DIAGNOSIS — Z7189 Other specified counseling: Secondary | ICD-10-CM

## 2019-09-27 DIAGNOSIS — Z86711 Personal history of pulmonary embolism: Secondary | ICD-10-CM | POA: Insufficient documentation

## 2019-09-27 DIAGNOSIS — Z7901 Long term (current) use of anticoagulants: Secondary | ICD-10-CM | POA: Diagnosis not present

## 2019-09-27 LAB — CBC WITH DIFFERENTIAL (CANCER CENTER ONLY)
Abs Immature Granulocytes: 0.05 10*3/uL (ref 0.00–0.07)
Basophils Absolute: 0.1 10*3/uL (ref 0.0–0.1)
Basophils Relative: 1 %
Eosinophils Absolute: 0 10*3/uL (ref 0.0–0.5)
Eosinophils Relative: 0 %
HCT: 39.7 % (ref 36.0–46.0)
Hemoglobin: 12.3 g/dL (ref 12.0–15.0)
Immature Granulocytes: 0 %
Lymphocytes Relative: 8 %
Lymphs Abs: 0.9 10*3/uL (ref 0.7–4.0)
MCH: 28 pg (ref 26.0–34.0)
MCHC: 31 g/dL (ref 30.0–36.0)
MCV: 90.4 fL (ref 80.0–100.0)
Monocytes Absolute: 0.7 10*3/uL (ref 0.1–1.0)
Monocytes Relative: 6 %
Neutro Abs: 10.4 10*3/uL — ABNORMAL HIGH (ref 1.7–7.7)
Neutrophils Relative %: 85 %
Platelet Count: 539 10*3/uL — ABNORMAL HIGH (ref 150–400)
RBC: 4.39 MIL/uL (ref 3.87–5.11)
RDW: 17.4 % — ABNORMAL HIGH (ref 11.5–15.5)
WBC Count: 12.1 10*3/uL — ABNORMAL HIGH (ref 4.0–10.5)
nRBC: 0 % (ref 0.0–0.2)

## 2019-09-27 LAB — URINALYSIS, COMPLETE (UACMP) WITH MICROSCOPIC
Bacteria, UA: NONE SEEN
Bilirubin Urine: NEGATIVE
Glucose, UA: NEGATIVE mg/dL
Hgb urine dipstick: NEGATIVE
Ketones, ur: NEGATIVE mg/dL
Leukocytes,Ua: NEGATIVE
Nitrite: NEGATIVE
Protein, ur: NEGATIVE mg/dL
Specific Gravity, Urine: 1.009 (ref 1.005–1.030)
pH: 7 (ref 5.0–8.0)

## 2019-09-27 LAB — CMP (CANCER CENTER ONLY)
ALT: 60 U/L — ABNORMAL HIGH (ref 0–44)
AST: 62 U/L — ABNORMAL HIGH (ref 15–41)
Albumin: 3.7 g/dL (ref 3.5–5.0)
Alkaline Phosphatase: 78 U/L (ref 38–126)
Anion gap: 11 (ref 5–15)
BUN: 12 mg/dL (ref 6–20)
CO2: 28 mmol/L (ref 22–32)
Calcium: 9.9 mg/dL (ref 8.9–10.3)
Chloride: 99 mmol/L (ref 98–111)
Creatinine: 0.65 mg/dL (ref 0.44–1.00)
GFR, Est AFR Am: >60 mL/min (ref >60)
GFR, Estimated: >60 mL/min (ref >60)
Glucose, Bld: 105 mg/dL — ABNORMAL HIGH (ref 70–99)
Potassium: 4.2 mmol/L (ref 3.5–5.1)
Sodium: 138 mmol/L (ref 135–145)
Total Bilirubin: <0.2 mg/dL — ABNORMAL LOW (ref 0.3–1.2)
Total Protein: 7.3 g/dL (ref 6.5–8.1)

## 2019-09-28 ENCOUNTER — Ambulatory Visit
Admission: RE | Admit: 2019-09-28 | Discharge: 2019-09-28 | Disposition: A | Discharge status: Home / Self Care | Payer: BC Managed Care – PPO | Source: Ambulatory Visit | Attending: Radiation Oncology | Admitting: Radiation Oncology

## 2019-09-28 ENCOUNTER — Other Ambulatory Visit: Payer: Self-pay

## 2019-09-28 ENCOUNTER — Telehealth: Payer: Self-pay

## 2019-09-28 DIAGNOSIS — C252 Malignant neoplasm of tail of pancreas: Secondary | ICD-10-CM | POA: Diagnosis not present

## 2019-09-28 LAB — URINE CULTURE: Culture: <10000 — AB

## 2019-09-28 NOTE — Telephone Encounter (Signed)
Called regarding mychart message for complaint of severe lower back pain. She is feeling good except for the back pain. She normally has back pain. For the last 3 days she has had pain in her back like she normally has with UTI. Her pain medication is helping. She is taking dilaudid 8 mg every 6 hours and she took a dose of Methadone last pm to help her go to sleep.  Offered per Dr. Jacklynn Lewis a urgent CT scan. She declined scan because she does not think that it will show anything. She will wait on the urine culture results.  Instructed to call the office back if she wants the CT scan ordered. Instructed to go to the ER to be evaluated for worsening symptoms. She verbalized understanding.

## 2019-10-01 ENCOUNTER — Other Ambulatory Visit: Payer: Self-pay

## 2019-10-01 ENCOUNTER — Ambulatory Visit
Admission: RE | Admit: 2019-10-01 | Discharge: 2019-10-01 | Disposition: A | Discharge status: Home / Self Care | Payer: BC Managed Care – PPO | Source: Ambulatory Visit | Attending: Radiation Oncology | Admitting: Radiation Oncology

## 2019-10-01 ENCOUNTER — Telehealth: Payer: Self-pay

## 2019-10-01 DIAGNOSIS — C252 Malignant neoplasm of tail of pancreas: Secondary | ICD-10-CM | POA: Diagnosis not present

## 2019-10-01 NOTE — Telephone Encounter (Signed)
Called and given urine culture results. Urine culture negative/ insignificant growth. She verbalized understanding. She is still complaining of pain in her lower back. Offered again per conversation on Friday that Dr. Alvy Bimler could order a urgent CT. She declined CT. She thinks that it is muscle pain and will talk with Dr. Alvy Bimler tomorrow at appt. Ask her to call the office back if needed.

## 2019-10-02 ENCOUNTER — Inpatient Hospital Stay (HOSPITAL_BASED_OUTPATIENT_CLINIC_OR_DEPARTMENT_OTHER): Payer: BC Managed Care – PPO | Admitting: Hematology and Oncology

## 2019-10-02 ENCOUNTER — Ambulatory Visit
Admission: RE | Admit: 2019-10-02 | Discharge: 2019-10-02 | Disposition: A | Discharge status: Home / Self Care | Payer: BC Managed Care – PPO | Source: Ambulatory Visit | Attending: Radiation Oncology | Admitting: Radiation Oncology

## 2019-10-02 ENCOUNTER — Other Ambulatory Visit: Payer: Self-pay

## 2019-10-02 VITALS — BP 119/77 | HR 71 | Temp 98.2°F | Resp 18 | Ht 62.0 in | Wt 186.2 lb

## 2019-10-02 DIAGNOSIS — D1803 Hemangioma of intra-abdominal structures: Secondary | ICD-10-CM | POA: Diagnosis not present

## 2019-10-02 DIAGNOSIS — Z86711 Personal history of pulmonary embolism: Secondary | ICD-10-CM | POA: Diagnosis not present

## 2019-10-02 DIAGNOSIS — C252 Malignant neoplasm of tail of pancreas: Secondary | ICD-10-CM

## 2019-10-02 DIAGNOSIS — R109 Unspecified abdominal pain: Secondary | ICD-10-CM

## 2019-10-02 DIAGNOSIS — Z79899 Other long term (current) drug therapy: Secondary | ICD-10-CM | POA: Diagnosis not present

## 2019-10-02 DIAGNOSIS — C7961 Secondary malignant neoplasm of right ovary: Secondary | ICD-10-CM | POA: Diagnosis not present

## 2019-10-02 DIAGNOSIS — Z452 Encounter for adjustment and management of vascular access device: Secondary | ICD-10-CM | POA: Diagnosis not present

## 2019-10-02 DIAGNOSIS — C541 Malignant neoplasm of endometrium: Secondary | ICD-10-CM | POA: Diagnosis not present

## 2019-10-02 DIAGNOSIS — Z7984 Long term (current) use of oral hypoglycemic drugs: Secondary | ICD-10-CM | POA: Diagnosis not present

## 2019-10-02 DIAGNOSIS — Z7901 Long term (current) use of anticoagulants: Secondary | ICD-10-CM | POA: Diagnosis not present

## 2019-10-02 DIAGNOSIS — M069 Rheumatoid arthritis, unspecified: Secondary | ICD-10-CM | POA: Diagnosis not present

## 2019-10-03 ENCOUNTER — Encounter: Payer: Self-pay | Admitting: Hematology and Oncology

## 2019-10-03 ENCOUNTER — Ambulatory Visit
Admission: RE | Admit: 2019-10-03 | Discharge: 2019-10-03 | Disposition: A | Discharge status: Home / Self Care | Payer: BC Managed Care – PPO | Source: Ambulatory Visit | Attending: Radiation Oncology | Admitting: Radiation Oncology

## 2019-10-03 ENCOUNTER — Other Ambulatory Visit: Payer: Self-pay

## 2019-10-03 ENCOUNTER — Inpatient Hospital Stay: Payer: BC Managed Care – PPO

## 2019-10-03 DIAGNOSIS — Z7901 Long term (current) use of anticoagulants: Secondary | ICD-10-CM | POA: Diagnosis not present

## 2019-10-03 DIAGNOSIS — C7961 Secondary malignant neoplasm of right ovary: Secondary | ICD-10-CM

## 2019-10-03 DIAGNOSIS — Z452 Encounter for adjustment and management of vascular access device: Secondary | ICD-10-CM | POA: Diagnosis not present

## 2019-10-03 DIAGNOSIS — C541 Malignant neoplasm of endometrium: Secondary | ICD-10-CM | POA: Diagnosis not present

## 2019-10-03 DIAGNOSIS — Z86711 Personal history of pulmonary embolism: Secondary | ICD-10-CM | POA: Diagnosis not present

## 2019-10-03 DIAGNOSIS — C252 Malignant neoplasm of tail of pancreas: Secondary | ICD-10-CM | POA: Diagnosis not present

## 2019-10-03 DIAGNOSIS — Z7984 Long term (current) use of oral hypoglycemic drugs: Secondary | ICD-10-CM | POA: Diagnosis not present

## 2019-10-03 DIAGNOSIS — Z79899 Other long term (current) drug therapy: Secondary | ICD-10-CM | POA: Diagnosis not present

## 2019-10-03 DIAGNOSIS — M069 Rheumatoid arthritis, unspecified: Secondary | ICD-10-CM | POA: Diagnosis not present

## 2019-10-03 DIAGNOSIS — D1803 Hemangioma of intra-abdominal structures: Secondary | ICD-10-CM | POA: Diagnosis not present

## 2019-10-03 MED ORDER — HEPARIN SOD (PORK) LOCK FLUSH 100 UNIT/ML IV SOLN
250.0000 [IU] | Freq: Once | INTRAVENOUS | Status: AC
  Administered 2019-10-03: 500 [IU]
  Filled 2019-10-03: qty 5

## 2019-10-03 MED ORDER — SODIUM CHLORIDE 0.9% FLUSH
10.0000 mL | Freq: Once | INTRAVENOUS | Status: AC
  Administered 2019-10-03: 10 mL
  Filled 2019-10-03: qty 10

## 2019-10-03 NOTE — Assessment & Plan Note (Signed)
She will be completing radiation therapy at the end of this week I recommend waiting minimum 6 weeks before repeating another CT imaging to assess response to therapy I have a long discussion with the GI oncologist from [facility] Per records obtained from his office, he will be ordering NexGen sequencing with foundation 1 and MSI testing on her tissue sample From her previous genetic testing after diagnosis of ovarian cancer, she is not a candidate for PARP inhibitor  In regards to the use of immunotherapy, specifically pembrolizumab for MSI high/mismatch repair deficient solid tumors, I have reservation about prescribing that to her in the setting of severe rheumatoid arthritis, chronic prednisone dependency and chronic pain management for severe joint pain I have also discussed this with my GI oncologist and she is also in agreement that the patient would be at high risk of adverse effects with pembrolizumab in the future Our preference would be to prescribe gemcitabine/Abraxane as the next line of treatment if she have evidence of disease progression on her CT imaging If the next CT imaging shows stability/no evidence of progression, our preference would be to give her a treatment break to allow for recovery from treatment and to monitor the status of her disease very closely with serial imaging study All these were discussed with the patient and I have addressed all her questions and she is in agreement with the plan of care

## 2019-10-03 NOTE — Patient Instructions (Signed)

## 2019-10-03 NOTE — Assessment & Plan Note (Signed)
She has different kind of pain in the lower back, separate from the upper abdominal pain that was present when she had pancreatitis Her pain is better controlled with methadone, Dilaudid and addition of muscle relaxant We discussed the risk and benefits of ordering CT imaging sooner but she felt it is not necessary She will continue dose pain medicine as needed for now

## 2019-10-03 NOTE — Progress Notes (Signed)
[place] OFFICE PROGRESS NOTE  Patient Care Team: [name], MD as PCP - General (Family Medicine)  ASSESSMENT & PLAN:  Malignant neoplasm of tail of pancreas [facility]) She will be completing radiation therapy at the end of this week I recommend waiting minimum 6 weeks before repeating another CT imaging to assess response to therapy I have a long discussion with the GI oncologist from [facility] Per records obtained from his office, he will be ordering NexGen sequencing with foundation 1 and MSI testing on her tissue sample From her previous genetic testing after diagnosis of ovarian cancer, she is not a candidate for PARP inhibitor  In regards to the use of immunotherapy, specifically pembrolizumab for MSI high/mismatch repair deficient solid tumors, I have reservation about prescribing that to her in the setting of severe rheumatoid arthritis, chronic prednisone dependency and chronic pain management for severe joint pain I have also discussed this with my GI oncologist and she is also in agreement that the patient would be at high risk of adverse effects with pembrolizumab in the future Our preference would be to prescribe gemcitabine/Abraxane as the next line of treatment if she have evidence of disease progression on her CT imaging If the next CT imaging shows stability/no evidence of progression, our preference would be to give her a treatment break to allow for recovery from treatment and to monitor the status of her disease very closely with serial imaging study All these were discussed with the patient and I have addressed all her questions and she is in agreement with the plan of care   Abdominal pain She has different kind of pain in the lower back, separate from the upper abdominal pain that was present when she had pancreatitis Her pain is better controlled with methadone, Dilaudid and addition of muscle relaxant We discussed the risk and benefits of ordering  CT imaging sooner but she felt it is not necessary She will continue dose pain medicine as needed for now   Orders Placed This Encounter  Procedures  . CT ABDOMEN PELVIS W CONTRAST    Standing Status:   Future    Standing Expiration Date:   10/01/2020    Order Specific Question:   If indicated for the ordered procedure, I authorize the administration of contrast media per Radiology protocol    Answer:   Yes    Order Specific Question:   Preferred imaging location?    Answer:   [facility]    Order Specific Question:   Radiology Contrast Protocol - do NOT remove file path    Answer:   [email or web address]    Order Specific Question:   Is patient pregnant?    Answer:   No    All questions were answered. The patient knows to call the clinic with any problems, questions or concerns. The total time spent in the appointment was 30 minutes encounter with patients including review of chart and various tests results, discussions about plan of care and coordination of care plan   Heath Lark, MD 10/03/2019 8:32 AM  INTERVAL HISTORY: Please see below for problem oriented charting. She returns for further follow-up regarding plan of care and her severe abdominal pain Last week, she call complaining of severe lower abdominal pain It is different than the characteristic of her previous upper quadrant pain related to the pancreas She thought she might have a UTI.  Urinalysis and urine culture were unremarkable She has been taking Dilaudid and methadone as needed.  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 Mixed attenuation mass emanates from the right adnexa measuring 10.8 x 8.0 cm very suspicious for right ovarian carcinoma. 2. Abnormality of the tail of the pancreas may be due to mild pancreatitis and small pseudocyst formation, but neoplasm cannot be excluded. 3. Small amount of ascites within abdomen and pelvis. 4. Small nonobstructing renal calculi bilaterally.    08/30/2017 Pathology Results   1. Uterus and cervix, with left fallopian tube - ENDOMETRIOID ADENOCARCINOMA, FIGO GRADE I, ARISING IN A BACKGROUND OF DIFFUSE COMPLEX ATYPICAL HYPERPLASIA. - CARCINOMA INVADES FOR OF DEPTH OF 0.2 CM WHERE THICKNESS OF MYOMETRIAL WALL IS 2.1 CM. - ALL RESECTION MARGINS ARE NEGATIVE FOR CARCINOMA. - NEGATIVE FOR LYMPHOVASCULAR OR PERINEURAL INVASION. - CERVICAL STROMA IS NOT INVOLVED. - SEE ONCOLOGY TABLE. - SEE NOTE 2. Ovary and fallopian tube, right - PRIMARY OVARIAN ENDOMETRIOID ADENOCARCINOMA, FIGO GRADE II, 12 CM. - THE OVARIAN SURFACE IS FOCALLY INVOLVED BY CARCINOMA. - NEGATIVE FOR LYMPHOVASCULAR INVASION. - BENIGN UNREMARKABLE FALLOPIAN TUBE, NEGATIVE FOR CARCINOMA. - SEE ONCOLOGY TABLE. - SEE NOTE 3. Cul-de-sac biopsy - METASTATIC ADENOCARCINOMA, MOST CONSISTENT WITH PRIMARY OVARIAN ENDOMETRIOID ADENOCARCINOMA. 4. Ovary, left - BENIGN UNREMARKABLE OVARY, NEGATIVE FOR MALIGNANCY. Microscopic Comment 1. UTERUS, CARCINOMA OR CARCINOSARCOMA Procedure: Total hysterectomy with bilateral salpingo-oophorectomy. Histologic type: Endometrioid adenocarcinoma. Histologic Grade: FIGO Grade I Myometrial invasion: Depth of invasion: 2 mm Myometrial thickness: 21 mm Uterine Serosa Involvement: Not identified Cervical stromal involvement: Not identified Extent of involvement of other organs: Not applicable Lymphovascular invasion:  Not identified Regional Lymph Nodes: Examined: 0 Sentinel 0 Non-sentinel 0 Total Tumor block for ancillary studies: 1H MMR / MSI testing: Pending Pathologic Stage Classification (pTNM, AJCC 8th edition): pT1a, pNX (v4.1.0.0) 2. OVARY or FALLOPIAN TUBE or PRIMARY PERITONEUM: Procedure: Salpingo-oophorectomy Specimen Integrity: Intact Tumor Site: Right ovary Ovarian Surface Involvement (required only if applicable): Focally involved by carcinoma Fallopian Tube Surface Involvement (required only if applicable): Not identified Tumor Size: 12 cm Histologic Type: Endometrioid adenocarcinoma Histologic Grade: Grade II Implants (required for advanced stage serous/seromucinous borderline tumors only): Not applicable Other Tissue/ Organ Involvement: Cul de sac biopsy involved by tumor Largest Extrapelvic Peritoneal Focus (required only if applicable): Not applicable Peritoneal/Ascitic Fluid: Negative for carcinoma (case # [id number]) Treatment Effect (required only for high-grade serous carcinomas): Not applicable Regional Lymph Nodes: No lymph nodes submitted or found Number of Lymph Nodes Examined: 0 Pathologic Stage Classification (pTNM, AJCC 8th Edition): pT2b, pN0 Representative Tumor Block: 2B and 2E 1. Molecular study for microsatellite instability and immunohistochemical stains for MMR-related proteins are pending and will be reported in an addendum. 2. Immunohistochemical stain show that the ovarian tumor is positive for CK7 and PAX8 (both diffuse), CDX2 (patchy and weak); and negative for CK20. This immunoprofile is consistent with the above diagnosis. Dr. Lyndon Code has reviewed this case and concurs with the above diagnosis. Molecular study for microsatellite instability and immunohistochemical stains for MMR-related proteins are pending and will be reported in an addendum   08/30/2017 Genetic Testing  Patient has genetic testing done for MSI  Results revealed patient has the following  mutation(s): loss of [facility] 2   08/30/2017 Surgery   Surgeon: Mart Piggs, MD Pre-operative Diagnosis:  1. Adnexal mass 2. Abnormal uterine bleeding 3. H/o Cecal CA  Post-operative Diagnosis:  1. Adhesive disease post colon resection 2. Endometrial cancer NOS 3. Adenocarcinoma unknown origin, right ovary, suspicious for GI primary  Operation:  1. Lysis of adhesions ~30 minutes 2. Robotic-assisted laparoscopic total hysterectomy with right salpingo-oophorectomy and left salpingectomy 3. Left oophorectomy (RA-laparoscopic) 4. Pelvic washings  Findings: Adhesions of omentum to anterior abdominal wall. Enlarged cystic right ovary ~10cm. Uterus had small nodules on serosa near where right adnexa was intimate with the surface. No obvious intraoperative rupture of cyst, although in 2 areas the wall was thin and one of these areas had some bleeding. Slight scarring of left bladder dome to LUS/cervix. Uterus on frozen section c/w hyperplasia and a small focus of endometrial CA - no myo invasion, <2cm in size. Frozen section on the right adnexa was carcinoma, met from colon or possibly Gyn, favor GI primary, defer to permanent. Left ovary was WNL.     09/15/2017 Cancer Staging   Staging form: Corpus Uteri - Carcinoma and Carcinosarcoma, AJCC 8th Edition - Pathologic: FIGO Stage IA (pT1a, pN0, cM0) - Signed by Heath Lark, MD on 09/15/2017   09/26/2017 Procedure   Successful placement of a right internal jugular approach power injectable Port-A-Cath. The catheter is ready for immediate use.   11/07/2017 Imaging   1. Since 08/18/2017, similar to slight decrease in size of a pancreatic body/tail junction lesion. Cross modality comparison relative to 09/16/2017 MRI is also grossly similar. 2. No evidence of metastatic disease. 3. Aortic Atherosclerosis (ICD10-I70.0).  4. Left nephrolithiasis.   11/18/2017 Genetic Testing   MSH2 c.2237dupT pathogenic mutation identified in the CancerNext  panel.  The CancerNext gene panel offered by [employer] includes sequencing and rearrangement analysis for the following 34 genes:   APC, ATM, BARD1, BMPR1A, BRCA1, BRCA2, BRIP1, CDH1, CDK4, CDKN2A, CHEK2, DICER1, HOXB13, EPCAM, GREM1, MLH1, MRE11A, MSH2, MSH6, MUTYH, NBN, NF1, PALB2, PMS2, POLD1, POLE, PTEN, RAD50, RAD51C, RAD51D, SMAD4, SMARCA4, STK11, and TP53.  The report date is November 18, 2017.  MSH2 [id number] pathogenic mutation identified on somatic testing.  These results are consistent with a diagnosis of Lynch syndrome.   06/18/2019 - 08/14/2019 Chemotherapy   The patient had FOLFIRINOX for chemotherapy treatment.     Secondary malignant neoplasm of right ovary ([place])  08/23/2017 Tumor Marker   Patient's tumor was tested for the following markers: CA-125 Results of the tumor marker test revealed 139.8   08/31/2017 Initial Diagnosis   Secondary malignant neoplasm of right ovary ([place])   09/15/2017 Cancer Staging   Staging form: Ovary, Fallopian Tube, and Primary Peritoneal Carcinoma, AJCC 8th Edition - Pathologic: Stage IIB (pT2b, pN0, cM0) - Signed by Heath Lark, MD on 09/15/2017   09/27/2017 Imaging   No evidence of metastatic disease or other acute findings within the thorax.  4 cm low-attenuation mass in pancreatic tail, highly suspicious for pancreatic carcinoma. This is caused splenic vein thrombosis, with new venous collaterals in the left upper quadrant. Consider endoscopic ultrasound with FNA for tissue diagnosis.  Stable benign hepatic hemangioma.    09/27/2017 Tumor Marker   Patient's tumor was tested for the following markers: CA-125 Results of the tumor marker test revealed 39.4   11/02/2017 Tumor Marker   Patient's tumor was tested for  the following markers: CA-125 Results of the tumor marker test revealed 19   11/07/2017 Tumor Marker   Patient's tumor was tested for the following markers: CA-125 Results of the tumor marker test revealed 18.3    11/18/2017 Genetic Testing   MSH2 c.2237dupT pathogenic mutation identified in the CancerNext panel.  The CancerNext gene panel offered by [employer] includes sequencing and rearrangement analysis for the following 34 genes:   APC, ATM, BARD1, BMPR1A, BRCA1, BRCA2, BRIP1, CDH1, CDK4, CDKN2A, CHEK2, DICER1, HOXB13, EPCAM, GREM1, MLH1, MRE11A, MSH2, MSH6, MUTYH, NBN, NF1, PALB2, PMS2, POLD1, POLE, PTEN, RAD50, RAD51C, RAD51D, SMAD4, SMARCA4, STK11, and TP53.  The report date is November 18, 2017.  MSH2 [id number] pathogenic mutation identified on somatic testing.  These results are consistent with a diagnosis of Lynch syndrome.   12/15/2017 Tumor Marker   Patient's tumor was tested for the following markers: CA-125 Results of the tumor marker test revealed 57.3   01/16/2018 Tumor Marker   Patient's tumor was tested for the following markers: CA-125 Results of the tumor marker test revealed 24.2   04/10/2018 Tumor Marker   Patient's tumor was tested for the following markers: CA-125 Results of the tumor marker test revealed 18.2   07/12/2018 Tumor Marker   Patient's tumor was tested for the following markers: CA-125 Results of the tumor marker test revealed 11.8   10/16/2018 Tumor Marker   Patient's tumor was tested for the following markers: CA125 Results of the tumor marker test revealed 12.4.   Malignant neoplasm of tail of pancreas ([place])  10/13/2017 Pathology Results   Pancreas tail mass, endoscopic ultrasound-guided, fine needle aspiration II (smears and cell block): Adenocarcinoma   11/18/2017 Genetic Testing   MSH2 c.2237dupT pathogenic mutation identified in the CancerNext panel.  The CancerNext gene panel offered by [employer] includes sequencing and rearrangement analysis for the following 34 genes:   APC, ATM, BARD1, BMPR1A, BRCA1, BRCA2, BRIP1, CDH1, CDK4, CDKN2A, CHEK2, DICER1, HOXB13, EPCAM, GREM1, MLH1, MRE11A, MSH2, MSH6, MUTYH, NBN, NF1, PALB2,  PMS2, POLD1, POLE, PTEN, RAD50, RAD51C, RAD51D, SMAD4, SMARCA4, STK11, and TP53.  The report date is November 18, 2017.  MSH2 [id number] pathogenic mutation identified on somatic testing.  These results are consistent with a diagnosis of Lynch syndrome.   11/24/2017 Pathology Results   A. "TAIL OF PANCREAS AND SPLEEN", DISTAL PANCREATECTOMY AND SPLENECTOMY: Invasive ductal adenocarcinoma, moderately to poorly differentiated with focal signet ring cell features, of pancreas (distal).   The carcinoma is 2.5 cm in greatest dimension grossly.   Treatment effects present in the form of fibrosis (50%). No lymphovascular or definite perineural invasion identified. All surgical margins are negative for tumor or high-grade dysplasia. Adjacent mucinous neoplasm, most consistent with Intraductal papillary mucinous neoplasm (IPMN) with low-grade dysplasia.   Uninvolved pancreas show atrophy and focal acute inflammation.   Twelve benign lymph nodes (0/12). Spleen with no significant histopathologic abnormalities.  PROCEDURE: distal pancreatectomy and splenectomy TUMOR SITE: distal pancreas TUMOR SIZE:  GREATEST DIMENSION: 2.5 cm  ADDITIONAL DIMENSIONS:  x  cm HISTOLOGIC TYPE: ductal adenocarcinoma HISTOLOGIC GRADE: grade 3 TUMOR EXTENSION: peripancreatic soft tissue MARGINS: negative for tumor TREATMENT EFFECT: treatment effects present in the form of fibrosis (50%). LYMPHOVASCULAR INVASION: not identified PERINEURAL INVASION: no definite evidence REGIONAL LYMPH NODES:   NUMBER OF LYMPH NODES INVOLVED: 0   NUMBER OF LYMPH NODES EXAMINED: 12 PATHOLOGIC STAGE CLASSIFICATION (pTNM, AJCC 8th Ed): ypT2, ypN0 DISTANT METASTASIS (pM): pMx ADDITIONAL PATHOLOGIC FINDINGS: mucinous neoplasm, most consistent with intraductal papillary mucinous  neoplasm (IPMN) with low-grade dysplasia, is identified adjacent to the main tumor.    11/24/2017 Cancer Staging   Staging form: Exocrine Pancreas, AJCC 8th Edition - Pathologic stage from 11/24/2017: Stage IB (pT2, pN0, cM0) - Signed by Truitt Merle, MD on 01/03/2018   11/25/2017 Surgery   She had surgery at [facility] 1. Exploratory Laparotomy 2. Distal Pancreatectomy and Splenectomy 3. Intraoperative Ultrasound 4. Open Cholecystectomy    12/15/2017 Cancer Staging   Staging form: Exocrine Pancreas, AJCC 8th Edition - Clinical: Stage IB (cT2, cN0, cM0) - Signed by Heath Lark, MD on 12/15/2017   12/28/2017 Imaging   12/28/2017 CT Abdomen  IMPRESSION: 1. Postoperative findings from recent partial pancreatectomy including a 21 cubic cm fluid collection along the pancreatic resection margin which could represent early pseudocyst. 2. Nodularity along the lateral limb of the left adrenal gland could also be postoperative but merit surveillance, as the pancreatic lesion was in close proximity to this adrenal gland on the prior CT of 11/07/2017. 3. Asymmetric fullness inferiorly in the left breast. The patient has a history of prior left breast procedures, correlation with mammography is recommended. 4. Other imaging findings of potential clinical significance: Stable hemangioma in the left hepatic lobe. Splenectomy. Aortic Atherosclerosis (ICD10-I70.0). Right hemicolectomy. Small focus of fat necrosis in the right anterior abdominal wall subcutaneous tissues near the laparotomy site. Bilateral nonobstructive nephrolithiasis.   01/02/2018 Tumor Marker   Patient's tumor was tested for the following markers: CA-19-9 Results of the tumor marker test revealed 5   01/03/2018 - 06/05/2018 Chemotherapy   She received modified dose FOLFIRINOX   04/10/2018 Imaging   1. Distal pancreatectomy, without findings of recurrent or metastatic disease. 2. Incompletely imaged hypoenhancement within lower lobe right pulmonary artery branch is likely chronic (but interval since 10/09/2017) pulmonary  embolism. Dedicated CTA could further evaluate. 3. Decrease in size of a peripancreatic complex fluid collection anteriorly, likely a resolving pseudocyst. 4. Decreased size of minimal fluid versus a borderline sized node in the gastrohepatic ligament. Recommend attention on follow-up. 5. Hepatic steatosis with a segment 4 hemangioma. 6. Aortic Atherosclerosis (ICD10-I70.0). This is significantly age advanced.   07/12/2018 Tumor Marker   Patient's tumor was tested for the following markers: CA-19-9 Results of the tumor marker test revealed 6   07/12/2018 Imaging   1. Status post distal pancreatectomy with stable postoperative fluid collection adjacent to the ventral aspect of the pancreatic head. No findings to suggest metastatic disease in the abdomen or pelvis. 2. Hepatic steatosis with small cavernous hemangioma in segment 4A of the liver. 3. Slight decreased size of nonenlarged gastrohepatic ligament lymph node, presumably benign. 4. Aortic atherosclerosis.    10/16/2018 Imaging   Status post distal pancreatectomy. Stable postoperative seroma along the surgical margin.   Status post hysterectomy and right oophorectomy.   Status post right hemicolectomy with appendectomy.   No evidence of recurrent or metastatic disease.   No colonic wall thickening or mass is evident on CT.   01/22/2019 Tumor Marker   Patient's tumor was tested for the following markers: CA-19.9 Results of the tumor marker test revealed 5   01/22/2019 Imaging   1. No evidence of local recurrence or metastatic disease status post distal pancreatectomy and splenectomy. 2. Stable small seroma anterior to the pancreatic head. 3. Postsurgical changes as described. 4. Stable additional incidental findings including a hepatic hemangioma, nonobstructing bilateral renal calculi and aortic Atherosclerosis (ICD10-I70.0).   04/18/2019 Imaging   1. Status post distal pancreatectomy and splenectomy. There has  been a  gradual increase in ill-defined soft tissue and celiac axis/SMA origin lymph nodes adjacent to the distal pancreatectomy margin and lesser curvature of the stomach/gastric body over sequential prior examinations dated 01/22/2019 and 09/26/2018. An enlarged lymph node or soft tissue nodule adjacent to the SMA origin now measures 1.8 x 0.8 cm. Findings are concerning for local recurrence of pancreatic malignancy. 2. Separately from the above findings, there remains a 1.0 cm low-attenuation nodule anterior to the remnant pancreatic neck, most consistent with postoperative seroma 3. No evidence of distant metastatic disease in the chest, abdomen, or pelvis. 4. Postoperative findings of cholecystectomy, right hemicolectomy, and hysterectomy. 5. Bilateral nonobstructive nephrolithiasis. 6.  Aortic Atherosclerosis (ICD10-I70.0).       05/28/2019 Tumor Marker   Patient's tumor was tested for the following markers: CA19-9 Results of the tumor marker test revealed 18   05/28/2019 Imaging   1. Status post distal pancreatectomy with continued further progression of the ill-defined soft tissue to the left of the celiac axis/SMA origin, now measuring 1.9 x 1.9 cm and highly concerning for recurrent/metastatic disease. This process abuts both the celiac axis, SMA, and posterior wall of the mid stomach. PET-CT may prove helpful to further evaluate. 2. Bandlike ill-defined soft tissue in the anterior abdomen, potentially peritoneal or omental is similar to perhaps minimally increased qualitatively in the interval. Close attention on follow-up recommended. 3. Left paraumbilical hernia contains a short segment of small bowel without complicating features. 4. Ill-defined very subtle area of decreased attenuation in the posterior aspect of hepatic segment IV. This is likely focal fatty deposition. Close attention on follow-up recommended. 5.  Aortic Atherosclerois (ICD10-170.0)   06/07/2019 Pathology Results   Malignant  cells consistent with adenocarcinoma   06/07/2019 Procedure   ENDOSCOPIC FINDING (limited views with linear echoendoscope): :      The examined esophagus was endoscopically normal.      The entire examined stomach was endoscopically normal.      ENDOSONOGRAPHIC FINDING: :      1. An irregular mass was identified in the region of the pancreatic tail resection site. The mass was stellate with very poorly defined borders. Fine needle aspiration for cytology was performed. Color Doppler imaging was utilized prior to needle puncture to confirm a lack of significant vascular structures within the needle path. Four passes were made with the 25 gauge (FNB) needle using a transgastric approach. A cytotechnologist was present to evaluate the adequacy of the specimen. Final cytology results are pending. Impression:               -  Irregular, stellate soft tissue mass in the region of the pancreatic tail resection site. This was sampled with four transgastric passes with an EUS FNB needle..   06/18/2019 - 08/14/2019 Chemotherapy   The patient had FOLFIRINOX for chemotherapy treatment.     09/03/2019 Imaging   1. Findings are again highly concerning for locally recurrent disease adjacent to the pancreatectomy bed, with enlarging soft tissue mass which is intimately associated with the superior mesenteric artery, celiac axis and superior aspect of the left renal  vein, as detailed above. 2. No evidence of metastatic disease in the thorax. 3. 2.7 x 1.2 cm cavernous hemangioma in segment 4A of the liver again noted. 4. Small nonobstructive calculi in the collecting systems of both kidneys measuring up to 3 mm in the lower pole collecting system of the left kidney. 5. Aortic atherosclerosis. 6. Additional incidental findings, as above.  REVIEW OF SYSTEMS:   Constitutional: Denies fevers, chills or abnormal weight loss Eyes: Denies blurriness of vision Ears, nose, mouth, throat, and face: Denies mucositis  or sore throat Respiratory: Denies cough, dyspnea or wheezes Cardiovascular: Denies palpitation, chest discomfort or lower extremity swelling Gastrointestinal:  Denies nausea, heartburn or change in bowel habits Skin: Denies abnormal skin rashes Lymphatics: Denies new lymphadenopathy or easy bruising Neurological:Denies numbness, tingling or new weaknesses Behavioral/Psych: Mood is stable, no new changes  All other systems were reviewed with the patient and are negative.  I have reviewed the past medical history, past surgical history, social history and family history with the patient and they are unchanged from previous note.  ALLERGIES:  is allergic to codeine, penicillins, and bactrim [sulfamethoxazole-trimethoprim].  MEDICATIONS:  Current Outpatient Medications  Medication Sig Dispense Refill  . cefixime (SUPRAX) 400 MG CAPS capsule Take 1 capsule (400 mg total) by mouth daily. 10 capsule 0  . cetirizine (ZYRTEC) 10 MG tablet Take 10 mg by mouth daily.    . citalopram (CELEXA) 20 MG tablet TAKE 1 TABLET BY MOUTH EVERY DAY 90 tablet 4  . CREON 24000-76000 units CPEP Take 1 capsule by mouth See admin instructions. Take 1 capsule before each meal and 1 capsule after each meal, take 1 cap with snacks    . diclofenac Sodium (VOLTAREN) 1 % GEL APPLY 2-4 GRAMS TO AFFECTED JOINT 4 TIMES DAILY AS NEEDED. 400 g 1  . estradiol (ESTRACE VAGINAL) 0.1 MG/GM vaginal cream Place 1 Applicatorful vaginally 3 (three) times a week. 42.5 g 12  . fluticasone (FLONASE) 50 MCG/ACT nasal spray Place 1 spray into both nostrils daily.    Marland Kitchen gabapentin (NEURONTIN) 300 MG capsule Take 1 capsule (300 mg total) by mouth 3 (three) times daily. 90 capsule 11  . HYDROmorphone (DILAUDID) 8 MG tablet Take 1 tablet (8 mg total) by mouth every 6 (six) hours as needed for severe pain. 90 tablet 0  . hydrOXYzine (ATARAX/VISTARIL) 10 MG tablet Take 1 tablet (10 mg total) by mouth 3 (three) times daily as needed. 30 tablet 1   . lidocaine-prilocaine (EMLA) cream Apply 1 application topically daily as needed (port). Apply to affected area once 30 g 11  . LORazepam (ATIVAN) 0.5 MG tablet Take 1 tablet (0.5 mg total) by mouth every 8 (eight) hours as needed for anxiety (nausea). 30 tablet 0  . metFORMIN (GLUCOPHAGE) 500 MG tablet TAKE 1 TABLET (500 MG TOTAL) BY MOUTH 2 (TWO) TIMES DAILY WITH A MEAL. (Patient taking differently: Take 500 mg by mouth 2 (two) times daily with a meal. ) 60 tablet 1  . methadone (DOLOPHINE) 10 MG tablet Take 2 tablets (20 mg total) by mouth every 12 (twelve) hours. 60 tablet 0  . methocarbamol (ROBAXIN) 500 MG tablet TAKE 1 TABLET BY MOUTH 3 TIMES DAILY AS NEEDED FOR MUSCLE SPASMS. (Patient taking differently: Take 500 mg by mouth every 8 (eight) hours as needed for muscle spasms. ) 90 tablet 0  . Multiple Vitamin (MULTIVITAMIN WITH MINERALS) TABS tablet Take 1 tablet by mouth daily.    Marland Kitchen omeprazole (PRILOSEC) 40 MG capsule Take 1 capsule (40 mg total) by mouth daily. 30 capsule 11  . ondansetron (ZOFRAN) 8 MG tablet TAKE 1 TAB BY MOUTH EVERY 8HOURS AS NEEDED FOR REFRACTORY NAUSEA/VOMITING START ON DAY 3AFTER CHEMO 8 tablet 7  . predniSONE (DELTASONE) 1 MG tablet Take 4 tablets (4 mg total) by mouth daily with breakfast. 360 tablet 1  . predniSONE (DELTASONE) 5  MG tablet Take 2 tablets (10 mg total) by mouth daily with breakfast. 60 tablet 0  . prochlorperazine (COMPAZINE) 10 MG tablet Take 1 tablet (10 mg total) by mouth every 6 (six) hours as needed. 90 tablet 3  . rivaroxaban (XARELTO) 20 MG TABS tablet Take 1 tablet (20 mg total) by mouth daily with supper. 30 tablet 9  . simethicone (MYLICON) 416 MG chewable tablet Chew 125 mg by mouth every 6 (six) hours as needed for flatulence.     No current facility-administered medications for this visit.    PHYSICAL EXAMINATION: ECOG PERFORMANCE STATUS: 1 - Symptomatic but completely ambulatory  Vitals:   10/02/19 1447  BP: 119/77  Pulse: 71   Resp: 18  Temp: 98.2 F (36.8 C)  SpO2: 100%   Filed Weights   10/02/19 1447  Weight: 186 lb 3.2 oz (84.5 kg)    GENERAL:alert, no distress and comfortable NEURO: alert & oriented x 3 with fluent speech, no focal motor/sensory deficits  LABORATORY DATA:  I have reviewed the data as listed    Component Value Date/Time   NA 138 09/27/2019 1535   K 4.2 09/27/2019 1535   CL 99 09/27/2019 1535   CO2 28 09/27/2019 1535   GLUCOSE 105 (H) 09/27/2019 1535   BUN 12 09/27/2019 1535   CREATININE 0.65 09/27/2019 1535   CREATININE 0.68 07/27/2017 0851   CALCIUM 9.9 09/27/2019 1535   PROT 7.3 09/27/2019 1535   ALBUMIN 3.7 09/27/2019 1535   AST 62 (H) 09/27/2019 1535   ALT 60 (H) 09/27/2019 1535   ALKPHOS 78 09/27/2019 1535   BILITOT <0.2 (L) 09/27/2019 1535   GFRNONAA >60 09/27/2019 1535   GFRNONAA 106 07/27/2017 0851   GFRAA >60 09/27/2019 1535   GFRAA 123 07/27/2017 0851    No results found for: SPEP, UPEP  Lab Results  Component Value Date   WBC 12.1 (H) 09/27/2019   NEUTROABS 10.4 (H) 09/27/2019   HGB 12.3 09/27/2019   HCT 39.7 09/27/2019   MCV 90.4 09/27/2019   PLT 539 (H) 09/27/2019      Chemistry      Component Value Date/Time   NA 138 09/27/2019 1535   K 4.2 09/27/2019 1535   CL 99 09/27/2019 1535   CO2 28 09/27/2019 1535   BUN 12 09/27/2019 1535   CREATININE 0.65 09/27/2019 1535   CREATININE 0.68 07/27/2017 0851      Component Value Date/Time   CALCIUM 9.9 09/27/2019 1535   ALKPHOS 78 09/27/2019 1535   AST 62 (H) 09/27/2019 1535   ALT 60 (H) 09/27/2019 1535   BILITOT <0.2 (L) 09/27/2019 1535       RADIOGRAPHIC STUDIES: I have personally reviewed the radiological images as listed and agreed with the findings in the report. CT CHEST W CONTRAST  Result Date: 09/03/2019 CLINICAL DATA:  44 year old female with history of pancreatic cancer. Evaluate response to therapy. EXAM: CT CHEST, ABDOMEN, AND PELVIS WITH CONTRAST TECHNIQUE: Multidetector CT  imaging of the chest, abdomen and pelvis was performed following the standard protocol during bolus administration of intravenous contrast. CONTRAST:  166m OMNIPAQUE IOHEXOL 300 MG/ML  SOLN COMPARISON:  CT the chest, abdomen and pelvis 04/17/2019. FINDINGS: CT CHEST FINDINGS Cardiovascular: Heart size is normal. There is no significant pericardial fluid, thickening or pericardial calcification. No atherosclerotic calcifications in the thoracic aorta or the coronary arteries. Right internal jugular single-lumen porta cath with tip terminating in the right atrium. Mediastinum/Nodes: No pathologically enlarged mediastinal or hilar lymph nodes. Esophagus  is unremarkable in appearance. No axillary lymphadenopathy. Lungs/Pleura: No suspicious pulmonary nodules or masses are noted. No acute consolidative airspace disease. No pleural effusions. Musculoskeletal: There are no aggressive appearing lytic or blastic lesions noted in the visualized portions of the skeleton. CT ABDOMEN PELVIS FINDINGS Hepatobiliary: In the periphery of segment 4A there is a 2.7 x 1.2 cm lesion which demonstrates peripheral nodular hyperenhancement during arterial phase imaging with progressive centripetal filling, similar to the prior study, compatible with a cavernous hemangioma. No other suspicious cystic or solid hepatic lesions. No intra or extrahepatic biliary ductal dilatation. Status post cholecystectomy. Pancreas: Postoperative changes of distal pancreatectomy are again noted. Adjacent to the surgical site there is again an intermediate attenuation heterogeneously enhancing soft tissue mass which appears increased in size compared to the prior study, currently measuring 2.9 x 2.0 x 2.1 cm (axial image 95 of series 7 and coronal image 45 of series 3). This lesion makes contact with the left lateral aspect of both the celiac axis and the superior mesenteric arteries, and is immediately above the left renal vein in contact with the renal  vein remaining body and head of the pancreas are otherwise unremarkable in appearance. Spleen: Status post splenectomy. Adrenals/Urinary Tract: Nonobstructive calculi are noted within the collecting systems of both kidneys measuring 3 mm in the lower pole of the left kidney and 2 mm in the interpolar region of the right kidney. Bilateral kidneys and adrenal glands are otherwise normal. No hydroureteronephrosis. Urinary bladder is normal in appearance. Stomach/Bowel: Normal appearance of the stomach. No pathologic dilatation of small bowel or colon. Status post right hemicolectomy. Vascular/Lymphatic: Aortic atherosclerosis, without evidence of aneurysm or dissection in the abdominal or pelvic vasculature. No definite lymphadenopathy noted in the abdomen or pelvis. Reproductive: Status post hysterectomy. Ovaries are not confidently identified may be surgically absent or atrophic. Other: No significant volume of ascites.  No pneumoperitoneum. Musculoskeletal: There are no aggressive appearing lytic or blastic lesions noted in the visualized portions of the skeleton. IMPRESSION: 1. Findings are again highly concerning for locally recurrent disease adjacent to the pancreatectomy bed, with enlarging soft tissue mass which is intimately associated with the superior mesenteric artery, celiac axis and superior aspect of the left renal vein, as detailed above. 2. No evidence of metastatic disease in the thorax. 3. 2.7 x 1.2 cm cavernous hemangioma in segment 4A of the liver again noted. 4. Small nonobstructive calculi in the collecting systems of both kidneys measuring up to 3 mm in the lower pole collecting system of the left kidney. 5. Aortic atherosclerosis. 6. Additional incidental findings, as above. Electronically Signed   By: Vinnie Langton M.D.   On: 09/03/2019 10:20   CT ABDOMEN PELVIS W CONTRAST  Result Date: 09/03/2019 CLINICAL DATA:  44 year old female with history of pancreatic cancer. Evaluate response to  therapy. EXAM: CT CHEST, ABDOMEN, AND PELVIS WITH CONTRAST TECHNIQUE: Multidetector CT imaging of the chest, abdomen and pelvis was performed following the standard protocol during bolus administration of intravenous contrast. CONTRAST:  133m OMNIPAQUE IOHEXOL 300 MG/ML  SOLN COMPARISON:  CT the chest, abdomen and pelvis 04/17/2019. FINDINGS: CT CHEST FINDINGS Cardiovascular: Heart size is normal. There is no significant pericardial fluid, thickening or pericardial calcification. No atherosclerotic calcifications in the thoracic aorta or the coronary arteries. Right internal jugular single-lumen porta cath with tip terminating in the right atrium. Mediastinum/Nodes: No pathologically enlarged mediastinal or hilar lymph nodes. Esophagus is unremarkable in appearance. No axillary lymphadenopathy. Lungs/Pleura: No suspicious pulmonary nodules or masses  are noted. No acute consolidative airspace disease. No pleural effusions. Musculoskeletal: There are no aggressive appearing lytic or blastic lesions noted in the visualized portions of the skeleton. CT ABDOMEN PELVIS FINDINGS Hepatobiliary: In the periphery of segment 4A there is a 2.7 x 1.2 cm lesion which demonstrates peripheral nodular hyperenhancement during arterial phase imaging with progressive centripetal filling, similar to the prior study, compatible with a cavernous hemangioma. No other suspicious cystic or solid hepatic lesions. No intra or extrahepatic biliary ductal dilatation. Status post cholecystectomy. Pancreas: Postoperative changes of distal pancreatectomy are again noted. Adjacent to the surgical site there is again an intermediate attenuation heterogeneously enhancing soft tissue mass which appears increased in size compared to the prior study, currently measuring 2.9 x 2.0 x 2.1 cm (axial image 95 of series 7 and coronal image 45 of series 3). This lesion makes contact with the left lateral aspect of both the celiac axis and the superior  mesenteric arteries, and is immediately above the left renal vein in contact with the renal vein remaining body and head of the pancreas are otherwise unremarkable in appearance. Spleen: Status post splenectomy. Adrenals/Urinary Tract: Nonobstructive calculi are noted within the collecting systems of both kidneys measuring 3 mm in the lower pole of the left kidney and 2 mm in the interpolar region of the right kidney. Bilateral kidneys and adrenal glands are otherwise normal. No hydroureteronephrosis. Urinary bladder is normal in appearance. Stomach/Bowel: Normal appearance of the stomach. No pathologic dilatation of small bowel or colon. Status post right hemicolectomy. Vascular/Lymphatic: Aortic atherosclerosis, without evidence of aneurysm or dissection in the abdominal or pelvic vasculature. No definite lymphadenopathy noted in the abdomen or pelvis. Reproductive: Status post hysterectomy. Ovaries are not confidently identified may be surgically absent or atrophic. Other: No significant volume of ascites.  No pneumoperitoneum. Musculoskeletal: There are no aggressive appearing lytic or blastic lesions noted in the visualized portions of the skeleton. IMPRESSION: 1. Findings are again highly concerning for locally recurrent disease adjacent to the pancreatectomy bed, with enlarging soft tissue mass which is intimately associated with the superior mesenteric artery, celiac axis and superior aspect of the left renal vein, as detailed above. 2. No evidence of metastatic disease in the thorax. 3. 2.7 x 1.2 cm cavernous hemangioma in segment 4A of the liver again noted. 4. Small nonobstructive calculi in the collecting systems of both kidneys measuring up to 3 mm in the lower pole collecting system of the left kidney. 5. Aortic atherosclerosis. 6. Additional incidental findings, as above. Electronically Signed   By: Vinnie Langton M.D.   On: 09/03/2019 10:20

## 2019-10-04 ENCOUNTER — Ambulatory Visit
Admission: RE | Admit: 2019-10-04 | Discharge: 2019-10-04 | Disposition: A | Discharge status: Home / Self Care | Payer: BC Managed Care – PPO | Source: Ambulatory Visit | Attending: Radiation Oncology | Admitting: Radiation Oncology

## 2019-10-04 ENCOUNTER — Other Ambulatory Visit: Payer: Self-pay

## 2019-10-04 DIAGNOSIS — C189 Malignant neoplasm of colon, unspecified: Secondary | ICD-10-CM | POA: Diagnosis not present

## 2019-10-04 DIAGNOSIS — C7961 Secondary malignant neoplasm of right ovary: Secondary | ICD-10-CM | POA: Diagnosis not present

## 2019-10-04 DIAGNOSIS — C252 Malignant neoplasm of tail of pancreas: Secondary | ICD-10-CM | POA: Diagnosis not present

## 2019-10-05 ENCOUNTER — Other Ambulatory Visit: Payer: Self-pay

## 2019-10-05 ENCOUNTER — Encounter: Payer: Self-pay | Admitting: Radiation Oncology

## 2019-10-05 ENCOUNTER — Ambulatory Visit
Admission: RE | Admit: 2019-10-05 | Discharge: 2019-10-05 | Disposition: A | Discharge status: Home / Self Care | Payer: BC Managed Care – PPO | Source: Ambulatory Visit | Attending: Radiation Oncology | Admitting: Radiation Oncology

## 2019-10-05 DIAGNOSIS — C801 Malignant (primary) neoplasm, unspecified: Secondary | ICD-10-CM | POA: Diagnosis not present

## 2019-10-05 DIAGNOSIS — C259 Malignant neoplasm of pancreas, unspecified: Secondary | ICD-10-CM | POA: Diagnosis not present

## 2019-10-05 DIAGNOSIS — C252 Malignant neoplasm of tail of pancreas: Secondary | ICD-10-CM | POA: Diagnosis not present

## 2019-10-08 DIAGNOSIS — C252 Malignant neoplasm of tail of pancreas: Secondary | ICD-10-CM | POA: Diagnosis not present

## 2019-10-09 ENCOUNTER — Encounter: Payer: Self-pay | Admitting: Hematology and Oncology

## 2019-10-11 DIAGNOSIS — E119 Type 2 diabetes mellitus without complications: Secondary | ICD-10-CM | POA: Diagnosis not present

## 2019-10-12 DIAGNOSIS — C252 Malignant neoplasm of tail of pancreas: Secondary | ICD-10-CM | POA: Diagnosis not present

## 2019-10-12 DIAGNOSIS — C189 Malignant neoplasm of colon, unspecified: Secondary | ICD-10-CM | POA: Diagnosis not present

## 2019-10-12 DIAGNOSIS — C7961 Secondary malignant neoplasm of right ovary: Secondary | ICD-10-CM | POA: Diagnosis not present

## 2019-10-15 NOTE — Progress Notes (Deleted)
Office Visit Note  Patient: Dana Hicks             Date of Birth: 12-Nov-1975           MRN: [id number]             PCP: [name], MD Referring: [name], MD Visit Date: 10/26/2019 Occupation: '@GUAROCC' @  Subjective:    History of Present Illness: Dana Hicks is a 44 y.o. female with history of seronegative rheumatoid arthritis and osteoarthritis.  She is taking prednisone daily.   Activities of Daily Living:  Patient reports morning stiffness for   {minute/hour:19697}.   Patient {ACTIONS;DENIES/REPORTS:21021675::"Denies"} nocturnal pain.  Difficulty dressing/grooming: {ACTIONS;DENIES/REPORTS:21021675::"Denies"} Difficulty climbing stairs: {ACTIONS;DENIES/REPORTS:21021675::"Denies"} Difficulty getting out of chair: {ACTIONS;DENIES/REPORTS:21021675::"Denies"} Difficulty using hands for taps, buttons, cutlery, and/or writing: {ACTIONS;DENIES/REPORTS:21021675::"Denies"}  Review of Systems  Constitutional: Negative for fatigue.  HENT: Negative for mouth sores, mouth dryness and nose dryness.   Eyes: Negative for pain, visual disturbance and dryness.  Respiratory: Negative for cough, hemoptysis, shortness of breath and difficulty breathing.   Cardiovascular: Negative for chest pain, palpitations, hypertension and swelling in legs/feet.  Gastrointestinal: Negative for blood in stool, constipation and diarrhea.  Endocrine: Negative for increased urination.  Genitourinary: Negative for painful urination.  Musculoskeletal: Negative for arthralgias, joint pain, joint swelling, myalgias, muscle weakness, morning stiffness, muscle tenderness and myalgias.  Skin: Negative for color change, pallor, rash, hair loss, nodules/bumps, skin tightness, ulcers and sensitivity to sunlight.  Allergic/Immunologic: Negative for susceptible to infections.  Neurological: Negative for dizziness, numbness, headaches and weakness.  Hematological: Negative for swollen glands.    Psychiatric/Behavioral: Negative for depressed mood and sleep disturbance. The patient is not nervous/anxious.     PMFS History:  Patient Active Problem List   Diagnosis Date Noted  . Cancer associated pain 07/02/2019  . Goals of care, counseling/discussion 06/11/2019  . Weight loss 05/29/2019  . Fever and chills 05/02/2019  . Abdominal pain 04/11/2019  . Pancreatitis, acute 04/09/2019  . Vaginal dryness, menopausal 01/23/2019  . S/P left unicompartmental knee replacement 11/27/2018  . UTI (urinary tract infection) 06/29/2018  . Pulmonary embolism ([place]) 04/10/2018  . Diarrhea due to drug 03/28/2018  . Steroid-induced diabetes ([place]) 03/14/2018  . Neutropenic fever ([place]) 02/14/2018  . Acute rhinitis 02/14/2018  . Tachycardia 02/14/2018  . Physical debility 02/07/2018  . Chemotherapy-induced nausea 02/07/2018  . Hot flashes due to surgical menopause 02/06/2018  . Anemia due to antineoplastic chemotherapy 12/15/2017  . Hypomagnesemia 12/15/2017  . Iron deficiency anemia 12/15/2017  . S/P splenectomy 12/15/2017  . Lynch syndrome 11/22/2017  . Anterior leg pain, right 11/21/2017  . Genetic testing 11/18/2017  . Inflammatory arthritis 11/08/2017  . Dysuria 11/02/2017  . Peripheral neuropathy due to chemotherapy ([place]) 10/22/2017  . Bone pain 10/21/2017  . Family history of colon cancer   . Malignant neoplasm of tail of pancreas ([place]) 10/17/2017  . Family history of uterine cancer 10/17/2017  . Sinusitis, chronic 09/15/2017  . Malabsorption due to disorder of pancreas 09/15/2017  . Endometrial cancer ([place]) 08/31/2017  . 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 Pustular folliculitis 09/47/0962  . MRSA colonization 05/12/2017  . Left foot pain 01/10/2017  . Osteoarthritis of both knees 12/01/2016  . Class 3 severe obesity due to excess calories without serious comorbidity with body mass index (BMI) of 40.0 to 44.9 in adult ([place]) 10/01/2016  . High risk  medication use 02/06/2016  . Family history of rheumatoid arthritis 02/06/2016  . H/O  seasonal allergies 02/06/2016  . Rheumatoid arthritis of multiple sites with negative rheumatoid factor ([place]) 02/03/2016  . History of colon cancer 02/03/2016  . HLA B27 (HLA B27 positive) 02/03/2016    Past Medical History:  Diagnosis Date  . Allergic rhinitis, seasonal   . 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 Depression   . Diabetes mellitus without complication ([place])   . Endometrial cancer ([place]) 08/2017   Stage IA, Grade 1  . GERD (gastroesophageal reflux disease)   . History of MRSA infection 01/2017   followed by infectious disease center--  recurrent pustular folliculitis  . Nephrolithiasis    bilateral nonobstructive calculi per CT 08-18-2017  . OA (osteoarthritis)   . Ovarian cancer, right ([place]) 08/2017   Stage II Grade 2 Endometrioid  . Pancreatic cancer ([place])   . Rheumatoid arthritis [facility])    rheumatologist-  dr devenswar-- treated w/ oral prednisone daily and methotrexate injection every 3 wks    Family History  Problem Relation Age of Onset  . Arthritis/Rheumatoid Mother   . Uterine cancer Mother 9  . Allergic rhinitis Father   . Heart disease Father   . Drug abuse Son   . Other Maternal Aunt        Unknown GYN CA  . Colon cancer Maternal Uncle 17  . Rectal cancer Maternal Uncle   . Diabetes Maternal Grandmother   . Cancer Maternal Grandfather        d. 66s of liver/lung cancer  . Angioedema Neg Hx   . Asthma Neg Hx   . Atopy Neg Hx   . Eczema Neg Hx   . Immunodeficiency Neg Hx   . Urticaria Neg Hx   . Esophageal cancer Neg Hx   . Stomach cancer Neg Hx    Past Surgical History:  Procedure Laterality Date  . BREAST LUMPECTOMY WITH RADIOACTIVE SEED LOCALIZATION Left 06/28/2014   Benign Procedure: RADIOACTIVE SEED LOCALIZATION LEFT BREAST LUMPECTOMY;  Surgeon: Excell Seltzer, MD;  Location: [facility];  Service: General;  Laterality: Left;  . COLONOSCOPY  06/19/14  . ESOPHAGOGASTRODUODENOSCOPY (EGD) WITH PROPOFOL N/A 06/07/2019   Procedure: ESOPHAGOGASTRODUODENOSCOPY (EGD) WITH PROPOFOL;  Surgeon: [name], MD;  Location: [facility];  Service: Endoscopy;  Laterality: N/A;  . EUS N/A 06/07/2019   Procedure: UPPER ENDOSCOPIC ULTRASOUND (EUS) LINEAR;  Surgeon: [name], MD;  Location: [facility];  Service: Endoscopy;  Laterality: N/A;  . EYE SURGERY Left    plug in tear duct  . FINE NEEDLE ASPIRATION N/A 06/07/2019   Procedure: FINE NEEDLE ASPIRATION (FNA) LINEAR;  Surgeon: [name], MD;  Location: [facility];  Service: Endoscopy;  Laterality: N/A;  . IR IMAGING GUIDED PORT INSERTION  09/26/2017  . KNEE ARTHROSCOPY    . PANCREATECTOMY  11/25/2017  . PARTIAL KNEE ARTHROPLASTY Left 11/27/2018   Procedure: UNICOMPARTMENTAL KNEE;  Surgeon: Vickey Huger, MD;  Location: [facility];  Service: Orthopedics;  Laterality: Left;  . PORT A CATH REVISION  2001   in and out  . RIGHT COLECTOMY  06/02/2009   cecum cancer  . ROBOTIC ASSISTED TOTAL HYSTERECTOMY WITH BILATERAL SALPINGO OOPHERECTOMY Bilateral 08/30/2017   Procedure: XI ROBOTIC ASSISTED TOTAL  HYSTERECTOMY BILATERAL  SALPINGO OOPHORECTOMY; LYSIS OF ADHESIONS;  Surgeon: Isabel Caprice, MD;  Location: [facility];  Service: Gynecology;  Laterality: Bilateral;  . SPLENECTOMY, PARTIAL  11/25/2017   Social History   Social History Narrative  .  Not on file   Immunization History  Administered Date(s) Administered  . HiB (PRP-T) 12/04/2017  . Influenza,inj,Quad PF,6+ Mos 02/18/2017, 12/04/2017  . Meningococcal B Recombinant 12/04/2017  . Meningococcal Conjugate 12/04/2017  . Pneumococcal Conjugate-13 12/04/2017     Objective: Vital Signs: LMP 08/30/2017    Physical Exam Vitals and nursing note reviewed.  Constitutional:      Appearance: She is well-developed.  HENT:     Head: Normocephalic and atraumatic.   Eyes:     Conjunctiva/sclera: Conjunctivae normal.  Pulmonary:     Effort: Pulmonary effort is normal.  Abdominal:     Palpations: Abdomen is soft.  Musculoskeletal:     Cervical back: Normal range of motion.  Skin:    General: Skin is warm and dry.     Capillary Refill: Capillary refill takes less than 2 seconds.  Neurological:     Mental Status: She is alert and oriented to person, place, and time.  Psychiatric:        Behavior: Behavior normal.      Musculoskeletal Exam: ***  CDAI Exam: CDAI Score: -- Patient Global: --; Provider Global: -- Swollen: --; Tender: -- Joint Exam 10/26/2019   No joint exam has been documented for this visit   There is currently no information documented on the homunculus. Go to the Rheumatology activity and complete the homunculus joint exam.  Investigation: No additional findings.  Imaging: No results found.  Recent Labs: Lab Results  Component Value Date   WBC 12.1 (H) 09/27/2019   HGB 12.3 09/27/2019   PLT 539 (H) 09/27/2019   NA 138 09/27/2019   K 4.2 09/27/2019   CL 99 09/27/2019   CO2 28 09/27/2019   GLUCOSE 105 (H) 09/27/2019   BUN 12 09/27/2019   CREATININE 0.65 09/27/2019   BILITOT <0.2 (L) 09/27/2019   ALKPHOS 78 09/27/2019   AST 62 (H) 09/27/2019   ALT 60 (H) 09/27/2019   PROT 7.3 09/27/2019   ALBUMIN 3.7 09/27/2019   CALCIUM 9.9 09/27/2019   GFRAA >60 09/27/2019   QFTBGOLDPLUS NEGATIVE 06/27/2018    Speciality Comments: No specialty comments available.  Procedures:  No procedures performed Allergies: Codeine, Penicillins, and Bactrim [sulfamethoxazole-trimethoprim]   Assessment / Plan:     Visit Diagnoses: Rheumatoid arthritis of multiple sites with negative rheumatoid factor (HCC)  High risk medication use - Orencia and otrexup? prednisone   HLA B27 (HLA B27 positive)  Primary osteoarthritis of right knee  Status post left partial knee replacement - Performed by Dr. Ronnie Derby on 11/27/18.  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 Alvy Bimler  Pancreatic neoplasm - surgical partial resection on 11/25/17.  She also had a splenectomy  Status post splenectomy  Orders: No orders of the defined types were placed in this encounter.  No orders of the defined types were placed in this encounter.   Face-to-face time spent with patient was *** minutes. Greater than 50% of time was spent in counseling and coordination of care.  Follow-Up Instructions: No follow-ups on file.   Ofilia Neas, PA-C  Note - This record has been created using Dragon software.  Chart creation errors have been sought, but may not always  have been located. Such creation errors do not reflect on  the standard of medical care.

## 2019-10-19 NOTE — Progress Notes (Signed)
  Radiation Oncology         [phone] ________________________________  Name: Dana Hicks MRN: [id number]  Date: 10/05/2019  DOB: 03-24-1975  End of Treatment Note  Diagnosis:   pancreatic cancer     Indication for treatment::  curative       Radiation treatment dates:   09/24/19 - 10/05/19  Site/dose:   the abdomen was treated to a dose of 40 Gy in 10 fractions, corresponding to a course of IMRT using a 3-field approach.  Narrative: The patient tolerated radiation treatment relatively well.   No major issues of nausea, diarrhea.  Plan: The patient has completed radiation treatment. The patient will return to radiation oncology clinic for routine followup in one month. I advised the patient to call or return sooner if they have any questions or concerns related to their recovery or treatment. ________________________________  Jodelle Gross, M.D., Ph.D.

## 2019-10-22 ENCOUNTER — Other Ambulatory Visit: Payer: Self-pay | Admitting: Hematology and Oncology

## 2019-10-22 ENCOUNTER — Other Ambulatory Visit: Payer: Self-pay | Admitting: Rheumatology

## 2019-10-22 NOTE — Telephone Encounter (Signed)
Last Visit: 03/20/2019 telemedicine  Next Visit: 10/26/2019  Current Dose:  prednisone 5 mg tablet, 2 tablets p.o. q. a.m. As her symptoms improve she can taper it down to prednisone 4 mg p.o. daily. Patient states she will keep [place] updated.   Okay to refill Prednisone?

## 2019-10-22 NOTE — Telephone Encounter (Signed)
Patient she is still off chemo and state she will need to stay on the Prednisone 10 mg.

## 2019-10-22 NOTE — Telephone Encounter (Signed)
Please ask patient if possible to taper by 2.5 mg every 2 weeks until she reaches 5 mg p.o. daily if her symptoms recur then she can stay on the current dose.

## 2019-10-26 ENCOUNTER — Ambulatory Visit: Payer: BC Managed Care – PPO | Admitting: Physician Assistant

## 2019-10-26 NOTE — Progress Notes (Signed)
Office Visit Note  Patient: Dana Hicks             Date of Birth: June 10, 1975           MRN: [id number]             PCP: [name], MD Referring: [name], MD Visit Date: 11/01/2019 Occupation: _0 @  Subjective:  Pain in both knees.   History of Present Illness: Dana Hicks is a 44 y.o. female with history of rheumatoid arthritis, osteoarthritis and pancreatic cancer.  She had recurrence of pancreatic cancer 3 months ago.  She had chemotherapy followed by radiation therapy.  She has CT scan is scheduled on September 27 th.  She went to [facility] oncology for a second opinion and was advised to be no therapy after genetic testing.  She was also told that after immunotherapy she may experience increased joint pain and joint swelling.  The [facility]ology [facility] at [facility] advised against immunotherapy.  She is not sure at this point if she would receive more chemotherapy or immunotherapy.  Currently she is on prednisone 10 mg p.o. daily.  She states as there have been few weeks since she received chemotherapy she has been experiencing some discomfort in her knee joints.  She is on Dilaudid for pancreatic cancer pain handouts helping her joint discomfort to some extent.  Activities of Daily Living:  Patient reports morning stiffness for 5-6 hours.   Patient Reports nocturnal pain.  Difficulty dressing/grooming: Denies Difficulty climbing stairs: Reports Difficulty getting out of chair: Reports Difficulty using hands for taps, buttons, cutlery, and/or writing: Denies  Review of Systems  Constitutional: Positive for fatigue.  HENT: Negative for mouth sores, mouth dryness and nose dryness.   Eyes: Negative for itching and dryness.  Respiratory: Negative for shortness of breath and difficulty breathing.   Cardiovascular: Negative for chest pain and palpitations.  Gastrointestinal: Negative for blood in stool, constipation and diarrhea.  Endocrine:  Negative for increased urination.  Genitourinary: Negative for difficulty urinating.  Musculoskeletal: Positive for arthralgias, joint pain, myalgias, morning stiffness, muscle tenderness and myalgias. Negative for joint swelling.  Skin: Negative for color change, rash and redness.  Allergic/Immunologic: Positive for susceptible to infections.  Neurological: Positive for memory loss and weakness. Negative for dizziness, numbness and headaches.  Hematological: Positive for bruising/bleeding tendency.  Psychiatric/Behavioral: Positive for confusion.    PMFS History:  Patient Active Problem List   Diagnosis Date Noted  . Cancer associated pain 07/02/2019  . Goals of care, counseling/discussion 06/11/2019  . Weight loss 05/29/2019  . Fever and chills 05/02/2019  . Abdominal pain 04/11/2019  . Pancreatitis, acute 04/09/2019  . Vaginal dryness, menopausal 01/23/2019  . S/P left unicompartmental knee replacement 11/27/2018  . UTI (urinary tract infection) 06/29/2018  . Pulmonary embolism ([place]) 04/10/2018  . Diarrhea due to drug 03/28/2018  . Steroid-induced diabetes ([place]) 03/14/2018  . Neutropenic fever ([place]) 02/14/2018  . Acute rhinitis 02/14/2018  . Tachycardia 02/14/2018  . Physical debility 02/07/2018  . Chemotherapy-induced nausea 02/07/2018  . Hot flashes due to surgical menopause 02/06/2018  . Anemia due to antineoplastic chemotherapy 12/15/2017  . Hypomagnesemia 12/15/2017  . Iron deficiency anemia 12/15/2017  . S/P splenectomy 12/15/2017  . Lynch syndrome 11/22/2017  . Anterior leg pain, right 11/21/2017  . Genetic testing 11/18/2017  . Inflammatory arthritis 11/08/2017  . Dysuria 11/02/2017  . Peripheral neuropathy due to chemotherapy ([place]) 10/22/2017  . Bone pain 10/21/2017  . Family history of  colon cancer   . Malignant neoplasm of tail of pancreas ([place]) 10/17/2017  . Family history of uterine cancer 10/17/2017  . Sinusitis, chronic 09/15/2017  . Malabsorption due  to disorder of pancreas 09/15/2017  . Endometrial cancer ([place]) 08/31/2017  . Secondary malignant neoplasm of right ovary ([place]) 08/31/2017  . Pustular folliculitis 11/91/4782  . MRSA colonization 05/12/2017  . Left foot pain 01/10/2017  . Osteoarthritis of both knees 12/01/2016  . Class 3 severe obesity due to excess calories without serious comorbidity with body mass index (BMI) of 40.0 to 44.9 in adult ([place]) 10/01/2016  . High risk medication use 02/06/2016  . Family history of rheumatoid arthritis 02/06/2016  . H/O seasonal allergies 02/06/2016  . Rheumatoid arthritis of multiple sites with negative rheumatoid factor ([place]) 02/03/2016  . History of colon cancer 02/03/2016  . HLA B27 (HLA B27 positive) 02/03/2016    Past Medical History:  Diagnosis Date  . Allergic rhinitis, seasonal   . Cecal cancer ([place]) 2001   Stage II (T3N0)  04-11-2001cecum-partial colectomy and completed chemo 2002  . Depression   . Diabetes mellitus without complication ([place])   . Endometrial cancer ([place]) 08/2017   Stage IA, Grade 1  . GERD (gastroesophageal reflux disease)   . History of MRSA infection 01/2017   followed by infectious disease center--  recurrent pustular folliculitis  . Nephrolithiasis    bilateral nonobstructive calculi per CT 08-18-2017  . OA (osteoarthritis)   . Ovarian cancer, right ([place]) 08/2017   Stage II Grade 2 Endometrioid  . Pancreatic cancer ([place])   . Rheumatoid arthritis [facility])    rheumatologist-  dr devenswar-- treated w/ oral prednisone daily and methotrexate injection every 3 wks    Family History  Problem Relation Age of Onset  . Arthritis/Rheumatoid Mother   . Uterine cancer Mother 77  . Allergic rhinitis Father   . Heart disease Father   . Drug abuse Son   . Other Maternal Aunt        Unknown GYN CA  . Colon cancer Maternal Uncle 83  . Rectal cancer Maternal Uncle   . Diabetes Maternal Grandmother   . Cancer Maternal Grandfather        d. 31s of liver/lung  cancer  . Angioedema Neg Hx   . Asthma Neg Hx   . Atopy Neg Hx   . Eczema Neg Hx   . Immunodeficiency Neg Hx   . Urticaria Neg Hx   . Esophageal cancer Neg Hx   . Stomach cancer Neg Hx    Past Surgical History:  Procedure Laterality Date  . BREAST LUMPECTOMY WITH RADIOACTIVE SEED LOCALIZATION Left 06/28/2014   Benign Procedure: RADIOACTIVE SEED LOCALIZATION LEFT BREAST LUMPECTOMY;  Surgeon: Excell Seltzer, MD;  Location: [facility];  Service: General;  Laterality: Left;  . COLONOSCOPY  06/19/14  . ESOPHAGOGASTRODUODENOSCOPY (EGD) WITH PROPOFOL N/A 06/07/2019   Procedure: ESOPHAGOGASTRODUODENOSCOPY (EGD) WITH PROPOFOL;  Surgeon: [name], MD;  Location: [facility];  Service: Endoscopy;  Laterality: N/A;  . EUS N/A 06/07/2019   Procedure: UPPER ENDOSCOPIC ULTRASOUND (EUS) LINEAR;  Surgeon: [name], MD;  Location: [facility];  Service: Endoscopy;  Laterality: N/A;  . EYE SURGERY Left    plug in tear duct  . FINE NEEDLE ASPIRATION N/A 06/07/2019   Procedure: FINE NEEDLE ASPIRATION (FNA) LINEAR;  Surgeon: [name], MD;  Location: [facility];  Service: Endoscopy;  Laterality: N/A;  . IR IMAGING GUIDED PORT INSERTION  09/26/2017  .  KNEE ARTHROSCOPY    . PANCREATECTOMY  11/25/2017  . PARTIAL KNEE ARTHROPLASTY Left 11/27/2018   Procedure: UNICOMPARTMENTAL KNEE;  Surgeon: Vickey Huger, MD;  Location: [facility];  Service: Orthopedics;  Laterality: Left;  . PORT A CATH REVISION  2001   in and out  . RIGHT COLECTOMY  06/02/2009   cecum cancer  . ROBOTIC ASSISTED TOTAL HYSTERECTOMY WITH BILATERAL SALPINGO OOPHERECTOMY Bilateral 08/30/2017   Procedure: XI ROBOTIC ASSISTED TOTAL  HYSTERECTOMY BILATERAL  SALPINGO OOPHORECTOMY; LYSIS OF ADHESIONS;  Surgeon: Isabel Caprice, MD;  Location: [facility];  Service: Gynecology;  Laterality: Bilateral;  . SPLENECTOMY, PARTIAL  11/25/2017   Social History   Social History Narrative  . Not on file   Immunization History   Administered Date(s) Administered  . HiB (PRP-T) 12/04/2017  . Influenza,inj,Quad PF,6+ Mos 02/18/2017, 12/04/2017  . Meningococcal B Recombinant 12/04/2017  . Meningococcal Conjugate 12/04/2017  . Pneumococcal Conjugate-13 12/04/2017     Objective: Vital Signs: BP 114/80 (BP Location: Left Arm, Patient Position: Sitting, Cuff Size: Normal)   Pulse 73   Resp 14   Ht 5' 2" (1.575 m)   Wt 190 lb 9.6 oz (86.5 kg)   LMP 08/30/2017   BMI 34.86 kg/m    Physical Exam Vitals and nursing note reviewed.  Constitutional:      Appearance: She is well-developed.  HENT:     Head: Normocephalic and atraumatic.  Eyes:     Conjunctiva/sclera: Conjunctivae normal.  Cardiovascular:     Rate and Rhythm: Normal rate and regular rhythm.     Heart sounds: Normal heart sounds.  Pulmonary:     Effort: Pulmonary effort is normal.     Breath sounds: Normal breath sounds.  Abdominal:     General: Bowel sounds are normal.     Palpations: Abdomen is soft.  Musculoskeletal:     Cervical back: Normal range of motion.  Lymphadenopathy:     Cervical: No cervical adenopathy.  Skin:    General: Skin is warm and dry.     Capillary Refill: Capillary refill takes less than 2 seconds.  Neurological:     Mental Status: She is alert and oriented to person, place, and time.  Psychiatric:        Behavior: Behavior normal.      Musculoskeletal Exam: C-spine was in good range of motion.  Shoulder joints, elbow joints, wrist joints, MCPs PIPs and DIPs with good range of motion with no synovitis.  Hip joints, knee joints, ankles, MTPs and PIPs with good range of motion with no synovitis.  She has some warmth on palpation of her knee joints.  CDAI Exam: CDAI Score: 3.2  Patient Global: 7 mm; Provider Global: 5 mm Swollen: 0 ; Tender: 2  Joint Exam 11/01/2019      Right  Left  Knee   Tender   Tender     Investigation: No additional findings.  Imaging: No results found.  Recent Labs: Lab Results   Component Value Date   WBC 12.1 (H) 09/27/2019   HGB 12.3 09/27/2019   PLT 539 (H) 09/27/2019   NA 138 09/27/2019   K 4.2 09/27/2019   CL 99 09/27/2019   CO2 28 09/27/2019   GLUCOSE 105 (H) 09/27/2019   BUN 12 09/27/2019   CREATININE 0.65 09/27/2019   BILITOT <0.2 (L) 09/27/2019   ALKPHOS 78 09/27/2019   AST 62 (H) 09/27/2019   ALT 60 (H) 09/27/2019   PROT 7.3 09/27/2019   ALBUMIN 3.7 09/27/2019  CALCIUM 9.9 09/27/2019   GFRAA >60 09/27/2019   QFTBGOLDPLUS NEGATIVE 06/27/2018    Speciality Comments: No specialty comments available.  Procedures:  No procedures performed Allergies: Codeine, Penicillins, and Bactrim [sulfamethoxazole-trimethoprim]   Assessment / Plan:     Visit Diagnoses: Rheumatoid arthritis of multiple sites with negative rheumatoid factor (HCC)-she has been off Orencia and methotrexate since the start of chemotherapy.  She was on lower dose of prednisone but due to increased pain she has been on prednisone 10 mg p.o. daily which is controlling her symptoms fairly well.  She states she still have some discomfort in her bilateral knee joints and occasionally in her hands.  No swelling or synovitis was noted today.  Although she had some warmth in her knee joints.  High risk medication use - Treated with Orencia and methotrexate in the past.  She has been off medications due to chemotherapy.  Long term (current) use of systemic steroids - She is on prednisone 10 mg p.o. daily for almost 2 years now.  We will schedule DEXA scan to evaluate for bone loss.  HLA B27 (HLA B27 positive)  Primary osteoarthritis of right knee-she has discomfort in her knee joints.  Status post left partial knee replacement-she continues to have some pain.  Trapezius muscle spasm-due to stress.  Family history of rheumatoid arthritis  Status post splenectomy  Pancreatic neoplasm-she had recurrence of pancreatic cancer about 3 months ago.  She has been through chemotherapy and  radiation therapy.  She is making decision regarding future chemotherapy versus immunotherapy.  We had detailed discussion regarding that.  She is aware that immunotherapy can cause increased joint pain and flare of rheumatoid arthritis.  At this point we will wait and see when that situation arise.  Secondary malignant neoplasm of right ovary (HCC)  History of colon cancer  Primary osteoarthritis of both knees  Educated about COVID-19 virus infection-she is fully vaccinated against COVID-19.  I have advised her to get booster.  Use of monoclonal antibody infusion in case she gets COVID-19 infection was also discussed.  Use of mask, social distancing and hand hygiene was emphasized.  Instructions were placed in the AVS.  BMI 34.86-weight loss diet and exercise was emphasized as tolerated.  Orders: Orders Placed This Encounter  Procedures  . DG BONE DENSITY (DXA)   No orders of the defined types were placed in this encounter.  .  Follow-Up Instructions: Return in about 3 months (around 01/31/2020) for Rheumatoid arthritis, Osteoarthritis.   Bo Merino, MD  Note - This record has been created using Editor, commissioning.  Chart creation errors have been sought, but may not always  have been located. Such creation errors do not reflect on  the standard of medical care.

## 2019-10-31 ENCOUNTER — Encounter: Payer: Self-pay | Admitting: Hematology and Oncology

## 2019-10-31 ENCOUNTER — Other Ambulatory Visit: Payer: Self-pay | Admitting: Obstetrics and Gynecology

## 2019-10-31 DIAGNOSIS — N644 Mastodynia: Secondary | ICD-10-CM

## 2019-11-01 ENCOUNTER — Ambulatory Visit (INDEPENDENT_AMBULATORY_CARE_PROVIDER_SITE_OTHER): Payer: BC Managed Care – PPO | Admitting: Rheumatology

## 2019-11-01 ENCOUNTER — Other Ambulatory Visit: Payer: Self-pay

## 2019-11-01 ENCOUNTER — Encounter: Payer: Self-pay | Admitting: Rheumatology

## 2019-11-01 VITALS — BP 114/80 | HR 73 | Resp 14 | Ht 62.0 in | Wt 190.6 lb

## 2019-11-01 DIAGNOSIS — Z7952 Long term (current) use of systemic steroids: Secondary | ICD-10-CM

## 2019-11-01 DIAGNOSIS — Z85038 Personal history of other malignant neoplasm of large intestine: Secondary | ICD-10-CM

## 2019-11-01 DIAGNOSIS — M62838 Other muscle spasm: Secondary | ICD-10-CM

## 2019-11-01 DIAGNOSIS — Z8261 Family history of arthritis: Secondary | ICD-10-CM

## 2019-11-01 DIAGNOSIS — C7961 Secondary malignant neoplasm of right ovary: Secondary | ICD-10-CM

## 2019-11-01 DIAGNOSIS — M1711 Unilateral primary osteoarthritis, right knee: Secondary | ICD-10-CM

## 2019-11-01 DIAGNOSIS — M0609 Rheumatoid arthritis without rheumatoid factor, multiple sites: Secondary | ICD-10-CM | POA: Diagnosis not present

## 2019-11-01 DIAGNOSIS — Z9081 Acquired absence of spleen: Secondary | ICD-10-CM

## 2019-11-01 DIAGNOSIS — Z6834 Body mass index (BMI) 34.0-34.9, adult: Secondary | ICD-10-CM

## 2019-11-01 DIAGNOSIS — Z1589 Genetic susceptibility to other disease: Secondary | ICD-10-CM | POA: Diagnosis not present

## 2019-11-01 DIAGNOSIS — Z79899 Other long term (current) drug therapy: Secondary | ICD-10-CM | POA: Diagnosis not present

## 2019-11-01 DIAGNOSIS — D490 Neoplasm of unspecified behavior of digestive system: Secondary | ICD-10-CM

## 2019-11-01 DIAGNOSIS — Z7189 Other specified counseling: Secondary | ICD-10-CM

## 2019-11-01 DIAGNOSIS — Z96652 Presence of left artificial knee joint: Secondary | ICD-10-CM

## 2019-11-01 NOTE — Patient Instructions (Signed)

## 2019-11-05 ENCOUNTER — Telehealth: Payer: Self-pay | Admitting: Radiation Oncology

## 2019-11-05 NOTE — Telephone Encounter (Signed)
  Radiation Oncology         [phone] ________________________________  Name: AMIA RYNDERS MRN: [id number]  Date of Service: 11/05/2019  DOB: 09/30/1975  Post Treatment Telephone Note  Diagnosis:   Adenocarcinoma of the tail of the pancreas in the setting of Lynch Syndrome who also has primary ovarian and primary endometrial cancer.  Interval Since Last Radiation:  5 weeks   09/24/19 - 10/05/19 SBRT Style Treatment: The abdominal pancreatic target was treated to a dose of 40 Gy in 10 fractions, corresponding to a course of IMRT using a 3-field approach.  Narrative:  The patient was contacted today for routine follow-up. During treatment she did very well with radiotherapy and did not have significant desquamation. She reports she had some nausea for a few weeks after radiation but this has since resolved. Compazine helped her symptoms however when she felt morning nauseas episodes. She did have mild fatigue as well which has also resolved at this time.  Impression/Plan: 1. Adenocarcinoma of the tail of the pancreas in the setting of Lynch Syndrome who also has primary ovarian and primary endometrial cancer. The patient has been doing well since completion of radiotherapy. We discussed that we would be happy to continue to follow her as needed, but she will also continue to follow up with Dr. Alvy Bimler in medical oncology.      Carola Rhine, PAC

## 2019-11-08 ENCOUNTER — Other Ambulatory Visit: Payer: Self-pay | Admitting: Rheumatology

## 2019-11-08 ENCOUNTER — Encounter: Payer: Self-pay | Admitting: Hematology and Oncology

## 2019-11-08 ENCOUNTER — Other Ambulatory Visit: Payer: Self-pay | Admitting: Hematology and Oncology

## 2019-11-08 ENCOUNTER — Telehealth: Payer: Self-pay

## 2019-11-08 DIAGNOSIS — T380X5D Adverse effect of glucocorticoids and synthetic analogues, subsequent encounter: Secondary | ICD-10-CM

## 2019-11-08 DIAGNOSIS — R300 Dysuria: Secondary | ICD-10-CM

## 2019-11-08 DIAGNOSIS — E099 Drug or chemical induced diabetes mellitus without complications: Secondary | ICD-10-CM

## 2019-11-08 NOTE — Telephone Encounter (Signed)
Called regarding mychart message. Scheduled for lab appt for tomorrow at 1015.

## 2019-11-08 NOTE — Telephone Encounter (Signed)
Last Visit: 11/01/2019 Next Visit: 02/07/2020  Okay to refill per Dr. Estanislado Pandy

## 2019-11-09 ENCOUNTER — Inpatient Hospital Stay: Payer: BC Managed Care – PPO | Attending: Hematology and Oncology

## 2019-11-09 ENCOUNTER — Other Ambulatory Visit: Payer: Self-pay

## 2019-11-09 DIAGNOSIS — C541 Malignant neoplasm of endometrium: Secondary | ICD-10-CM | POA: Diagnosis not present

## 2019-11-09 DIAGNOSIS — C7961 Secondary malignant neoplasm of right ovary: Secondary | ICD-10-CM | POA: Insufficient documentation

## 2019-11-09 DIAGNOSIS — I700 Atherosclerosis of aorta: Secondary | ICD-10-CM | POA: Diagnosis not present

## 2019-11-09 DIAGNOSIS — Z7984 Long term (current) use of oral hypoglycemic drugs: Secondary | ICD-10-CM | POA: Diagnosis not present

## 2019-11-09 DIAGNOSIS — T380X5D Adverse effect of glucocorticoids and synthetic analogues, subsequent encounter: Secondary | ICD-10-CM

## 2019-11-09 DIAGNOSIS — G893 Neoplasm related pain (acute) (chronic): Secondary | ICD-10-CM | POA: Insufficient documentation

## 2019-11-09 DIAGNOSIS — E099 Drug or chemical induced diabetes mellitus without complications: Secondary | ICD-10-CM

## 2019-11-09 DIAGNOSIS — Z7901 Long term (current) use of anticoagulants: Secondary | ICD-10-CM | POA: Diagnosis not present

## 2019-11-09 DIAGNOSIS — Z79899 Other long term (current) drug therapy: Secondary | ICD-10-CM | POA: Insufficient documentation

## 2019-11-09 DIAGNOSIS — R300 Dysuria: Secondary | ICD-10-CM

## 2019-11-09 DIAGNOSIS — C252 Malignant neoplasm of tail of pancreas: Secondary | ICD-10-CM | POA: Diagnosis not present

## 2019-11-09 LAB — CBC WITH DIFFERENTIAL/PLATELET
Abs Immature Granulocytes: 0.07 10*3/uL (ref 0.00–0.07)
Basophils Absolute: 0.1 10*3/uL (ref 0.0–0.1)
Basophils Relative: 0 %
Eosinophils Absolute: 0.1 10*3/uL (ref 0.0–0.5)
Eosinophils Relative: 1 %
HCT: 37.8 % (ref 36.0–46.0)
Hemoglobin: 12.1 g/dL (ref 12.0–15.0)
Immature Granulocytes: 1 %
Lymphocytes Relative: 8 %
Lymphs Abs: 1.1 10*3/uL (ref 0.7–4.0)
MCH: 28.3 pg (ref 26.0–34.0)
MCHC: 32 g/dL (ref 30.0–36.0)
MCV: 88.5 fL (ref 80.0–100.0)
Monocytes Absolute: 0.6 10*3/uL (ref 0.1–1.0)
Monocytes Relative: 5 %
Neutro Abs: 11.9 10*3/uL — ABNORMAL HIGH (ref 1.7–7.7)
Neutrophils Relative %: 85 %
Platelets: 496 10*3/uL — ABNORMAL HIGH (ref 150–400)
RBC: 4.27 MIL/uL (ref 3.87–5.11)
RDW: 15.9 % — ABNORMAL HIGH (ref 11.5–15.5)
WBC: 13.9 10*3/uL — ABNORMAL HIGH (ref 4.0–10.5)
nRBC: 0 % (ref 0.0–0.2)

## 2019-11-09 LAB — URINALYSIS, COMPLETE (UACMP) WITH MICROSCOPIC
Bilirubin Urine: NEGATIVE
Glucose, UA: NEGATIVE mg/dL
Hgb urine dipstick: NEGATIVE
Ketones, ur: NEGATIVE mg/dL
Leukocytes,Ua: NEGATIVE
Nitrite: NEGATIVE
Protein, ur: NEGATIVE mg/dL
Specific Gravity, Urine: 1.004 — ABNORMAL LOW (ref 1.005–1.030)
pH: 5 (ref 5.0–8.0)

## 2019-11-09 LAB — COMPREHENSIVE METABOLIC PANEL
ALT: 30 U/L (ref 0–44)
AST: 19 U/L (ref 15–41)
Albumin: 3.5 g/dL (ref 3.5–5.0)
Alkaline Phosphatase: 62 U/L (ref 38–126)
Anion gap: 8 (ref 5–15)
BUN: 13 mg/dL (ref 6–20)
CO2: 27 mmol/L (ref 22–32)
Calcium: 9.4 mg/dL (ref 8.9–10.3)
Chloride: 101 mmol/L (ref 98–111)
Creatinine, Ser: 0.67 mg/dL (ref 0.44–1.00)
GFR calc Af Amer: >60 mL/min (ref >60)
GFR calc non Af Amer: >60 mL/min (ref >60)
Glucose, Bld: 101 mg/dL — ABNORMAL HIGH (ref 70–99)
Potassium: 4.4 mmol/L (ref 3.5–5.1)
Sodium: 136 mmol/L (ref 135–145)
Total Bilirubin: <0.2 mg/dL — ABNORMAL LOW (ref 0.3–1.2)
Total Protein: 7.2 g/dL (ref 6.5–8.1)

## 2019-11-10 LAB — URINE CULTURE: Culture: <10000 — AB

## 2019-11-12 ENCOUNTER — Telehealth: Payer: Self-pay

## 2019-11-12 ENCOUNTER — Encounter: Payer: Self-pay | Admitting: Hematology and Oncology

## 2019-11-12 NOTE — Telephone Encounter (Signed)
-----   Message from Heath Lark, MD sent at 11/12/2019  8:35 AM EDT ----- Regarding: labs You can cancel her labs next week Urine cultures are neg

## 2019-11-12 NOTE — Telephone Encounter (Signed)
Called and left below message. Ask her to call the office for questions. ?

## 2019-11-12 NOTE — Telephone Encounter (Signed)
Called and left below message. Ask her to call the office back. Hgb A1c will need to be repeated if needed.

## 2019-11-12 NOTE — Telephone Encounter (Signed)
No other suggesting except she can try tylenol pm

## 2019-11-12 NOTE — Telephone Encounter (Signed)
Called back. She does not want to repeat the HGB A1c since the glucose was okay. Lab appt canceled for next week.  She is complaining of insomnia. She is sleeping 3-4 hours a night. She is taking Melatonin 20 mg nightly. Her Prednisone was increased recently for her RA. She thinks that is the problem. She has decreased her Dilaudid also.  She is asking if Dr. Alvy Bimler has any suggestions.

## 2019-11-16 ENCOUNTER — Other Ambulatory Visit: Payer: Self-pay

## 2019-11-16 ENCOUNTER — Inpatient Hospital Stay: Payer: BC Managed Care – PPO

## 2019-11-16 ENCOUNTER — Encounter (HOSPITAL_COMMUNITY): Payer: Self-pay

## 2019-11-16 ENCOUNTER — Ambulatory Visit (HOSPITAL_COMMUNITY)
Admission: RE | Admit: 2019-11-16 | Discharge: 2019-11-16 | Disposition: A | Discharge status: Home / Self Care | Payer: BC Managed Care – PPO | Source: Ambulatory Visit | Attending: Hematology and Oncology | Admitting: Hematology and Oncology

## 2019-11-16 DIAGNOSIS — C541 Malignant neoplasm of endometrium: Secondary | ICD-10-CM | POA: Insufficient documentation

## 2019-11-16 DIAGNOSIS — G893 Neoplasm related pain (acute) (chronic): Secondary | ICD-10-CM | POA: Diagnosis not present

## 2019-11-16 DIAGNOSIS — Z7984 Long term (current) use of oral hypoglycemic drugs: Secondary | ICD-10-CM | POA: Diagnosis not present

## 2019-11-16 DIAGNOSIS — C7961 Secondary malignant neoplasm of right ovary: Secondary | ICD-10-CM | POA: Insufficient documentation

## 2019-11-16 DIAGNOSIS — D1809 Hemangioma of other sites: Secondary | ICD-10-CM | POA: Diagnosis not present

## 2019-11-16 DIAGNOSIS — C252 Malignant neoplasm of tail of pancreas: Secondary | ICD-10-CM

## 2019-11-16 DIAGNOSIS — K8689 Other specified diseases of pancreas: Secondary | ICD-10-CM | POA: Diagnosis not present

## 2019-11-16 DIAGNOSIS — I700 Atherosclerosis of aorta: Secondary | ICD-10-CM | POA: Diagnosis not present

## 2019-11-16 DIAGNOSIS — N200 Calculus of kidney: Secondary | ICD-10-CM | POA: Diagnosis not present

## 2019-11-16 DIAGNOSIS — K429 Umbilical hernia without obstruction or gangrene: Secondary | ICD-10-CM | POA: Diagnosis not present

## 2019-11-16 DIAGNOSIS — Z7901 Long term (current) use of anticoagulants: Secondary | ICD-10-CM | POA: Diagnosis not present

## 2019-11-16 DIAGNOSIS — Z79899 Other long term (current) drug therapy: Secondary | ICD-10-CM | POA: Diagnosis not present

## 2019-11-16 MED ORDER — HEPARIN SOD (PORK) LOCK FLUSH 100 UNIT/ML IV SOLN
INTRAVENOUS | Status: AC
  Administered 2019-11-16: 500 [IU] via INTRAVENOUS
  Filled 2019-11-16: qty 5

## 2019-11-16 MED ORDER — SODIUM CHLORIDE 0.9% FLUSH
10.0000 mL | Freq: Once | INTRAVENOUS | Status: AC
  Administered 2019-11-16: 10 mL
  Filled 2019-11-16: qty 10

## 2019-11-16 MED ORDER — IOHEXOL 300 MG/ML  SOLN
100.0000 mL | Freq: Once | INTRAMUSCULAR | Status: AC | PRN
  Administered 2019-11-16: 100 mL via INTRAVENOUS

## 2019-11-16 MED ORDER — HEPARIN SOD (PORK) LOCK FLUSH 100 UNIT/ML IV SOLN
500.0000 [IU] | Freq: Once | INTRAVENOUS | Status: AC
  Administered 2019-11-16: 500 [IU]
  Filled 2019-11-16: qty 5

## 2019-11-16 MED ORDER — HEPARIN SOD (PORK) LOCK FLUSH 100 UNIT/ML IV SOLN: 500.0000 [IU] | Freq: Once | INTRAVENOUS | Status: AC

## 2019-11-16 NOTE — Patient Instructions (Signed)

## 2019-11-19 ENCOUNTER — Inpatient Hospital Stay: Payer: BC Managed Care – PPO

## 2019-11-19 ENCOUNTER — Ambulatory Visit (HOSPITAL_COMMUNITY): Payer: BC Managed Care – PPO

## 2019-11-19 ENCOUNTER — Telehealth: Payer: Self-pay | Admitting: Gastroenterology

## 2019-11-19 NOTE — Telephone Encounter (Signed)
Patient called has questions on her having pancreatic cancer she did not want to provide any other information

## 2019-11-19 NOTE — Telephone Encounter (Signed)
The pt had questions about her father NOT her care.  I advised her that she would need to have her fathers records faxed here for review to see if Dr Ardis Hughs will accept him as a pt. The pt has been advised of the information and verbalized understanding.

## 2019-11-20 ENCOUNTER — Encounter: Payer: Self-pay | Admitting: Hematology and Oncology

## 2019-11-20 ENCOUNTER — Other Ambulatory Visit: Payer: Self-pay

## 2019-11-20 ENCOUNTER — Inpatient Hospital Stay (HOSPITAL_BASED_OUTPATIENT_CLINIC_OR_DEPARTMENT_OTHER): Payer: BC Managed Care – PPO | Admitting: Hematology and Oncology

## 2019-11-20 DIAGNOSIS — C7961 Secondary malignant neoplasm of right ovary: Secondary | ICD-10-CM

## 2019-11-20 DIAGNOSIS — G893 Neoplasm related pain (acute) (chronic): Secondary | ICD-10-CM

## 2019-11-20 DIAGNOSIS — C541 Malignant neoplasm of endometrium: Secondary | ICD-10-CM | POA: Diagnosis not present

## 2019-11-20 DIAGNOSIS — Z79899 Other long term (current) drug therapy: Secondary | ICD-10-CM | POA: Diagnosis not present

## 2019-11-20 DIAGNOSIS — Z7189 Other specified counseling: Secondary | ICD-10-CM

## 2019-11-20 DIAGNOSIS — Z7901 Long term (current) use of anticoagulants: Secondary | ICD-10-CM | POA: Diagnosis not present

## 2019-11-20 DIAGNOSIS — I700 Atherosclerosis of aorta: Secondary | ICD-10-CM | POA: Diagnosis not present

## 2019-11-20 DIAGNOSIS — C252 Malignant neoplasm of tail of pancreas: Secondary | ICD-10-CM | POA: Diagnosis not present

## 2019-11-20 DIAGNOSIS — Z7984 Long term (current) use of oral hypoglycemic drugs: Secondary | ICD-10-CM | POA: Diagnosis not present

## 2019-11-20 MED ORDER — HYDROMORPHONE HCL 4 MG PO TABS
4.0000 mg | ORAL_TABLET | ORAL | 0 refills | Status: DC | PRN
Start: 2019-11-20 — End: 2019-12-13

## 2019-11-20 NOTE — Assessment & Plan Note (Signed)
I refill her prescription pain medicine today As discussed, I plan to reduce her Dilaudid by half

## 2019-11-20 NOTE — Assessment & Plan Note (Signed)
CT imaging showed no signs of cancer recurrence Her recent tumor marker is stable 

## 2019-11-20 NOTE — Assessment & Plan Note (Signed)
We have extensive discussions about the goals of care  She is at peace for active surveillance for now

## 2019-11-20 NOTE — Assessment & Plan Note (Signed)
She has chronic intermittent back pain at the area of her surgical resection site recently but overall improved CT imaging showed stability of the abnormal mass seen as before Per previous discussion, for now, I do not plan to start her on any chemotherapy He has to be monitored carefully with serial imaging study every 3 months, the next CT imaging will be due at the end of the year

## 2019-11-20 NOTE — Progress Notes (Signed)
[place] OFFICE PROGRESS NOTE  Patient Care Team: [name], MD as PCP - General (Family Medicine)  ASSESSMENT & PLAN:  Malignant neoplasm of tail of pancreas [facility]) She has chronic intermittent back pain at the area of her surgical resection site recently but overall improved CT imaging showed stability of the abnormal mass seen as before Per previous discussion, for now, I do not plan to start her on any chemotherapy He has to be monitored carefully with serial imaging study every 3 months, the next CT imaging will be due at the end of the year  Endometrial cancer [facility]) We will follow with CT imaging She has completed adjuvant therapy  Secondary malignant neoplasm of right ovary [facility]) CT imaging showed no signs of cancer recurrence Her recent tumor marker is stable  Cancer associated pain I refill her prescription pain medicine today As discussed, I plan to reduce her Dilaudid by half  Goals of care, counseling/discussion We have extensive discussions about the goals of care  She is at peace for active surveillance for now   No orders of the defined types were placed in this encounter.   All questions were answered. The patient knows to call the clinic with any problems, questions or concerns. The total time spent in the appointment was 30 minutes encounter with patients including review of chart and various tests results, discussions about plan of care and coordination of care plan   Heath Lark, MD 11/20/2019 3:19 PM  INTERVAL HISTORY: Please see below for problem oriented charting. She returns to review imaging studies She continues to have lower back pain and mid back pain Her mid back pain is related to the site of her pancreatic cancer recurrence The lower back pain is consistent with her chronic pain Recently, she attempted to taper herself off Dilaudid but suffered from major side effects She could not tolerate higher dose prednisone that was  prescribed by her rheumatologist to treat her rheumatoid arthritis She is also distressed that she has gained some weight No recent infection, fever or chills  SUMMARY OF ONCOLOGIC HISTORY: Oncology History Overview Note  MSI positive  Endometrial :endometrioid Ovarian: 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 Mixed attenuation mass emanates from the right adnexa measuring 10.8 x 8.0 cm very suspicious for right ovarian carcinoma. 2. Abnormality of the tail of the pancreas may be due to mild pancreatitis and small pseudocyst formation, but neoplasm cannot be excluded. 3. Small amount of ascites within abdomen and pelvis. 4. Small nonobstructing renal calculi bilaterally.    08/30/2017 Pathology Results   1. Uterus and cervix, with left fallopian tube - ENDOMETRIOID ADENOCARCINOMA, FIGO GRADE I, ARISING IN A BACKGROUND OF DIFFUSE COMPLEX ATYPICAL HYPERPLASIA. - CARCINOMA INVADES FOR OF DEPTH OF 0.2 CM WHERE THICKNESS OF MYOMETRIAL WALL IS 2.1 CM. - ALL RESECTION MARGINS ARE NEGATIVE FOR CARCINOMA. - NEGATIVE FOR LYMPHOVASCULAR OR PERINEURAL INVASION. - CERVICAL STROMA IS NOT INVOLVED. - SEE ONCOLOGY TABLE. - SEE NOTE 2. Ovary and fallopian tube, right - PRIMARY OVARIAN ENDOMETRIOID ADENOCARCINOMA, FIGO GRADE II, 12 CM. - THE OVARIAN SURFACE IS FOCALLY INVOLVED BY CARCINOMA. - NEGATIVE FOR LYMPHOVASCULAR INVASION. - BENIGN UNREMARKABLE FALLOPIAN TUBE, NEGATIVE FOR CARCINOMA. - SEE ONCOLOGY TABLE. - SEE NOTE 3. Cul-de-sac biopsy - METASTATIC ADENOCARCINOMA, MOST CONSISTENT WITH PRIMARY OVARIAN ENDOMETRIOID ADENOCARCINOMA. 4. Ovary, left - BENIGN UNREMARKABLE OVARY, NEGATIVE FOR MALIGNANCY.  Microscopic Comment 1. UTERUS, CARCINOMA OR CARCINOSARCOMA Procedure: Total hysterectomy with bilateral salpingo-oophorectomy. Histologic type: Endometrioid  adenocarcinoma. Histologic Grade: FIGO Grade I Myometrial invasion: Depth of invasion: 2 mm Myometrial thickness: 21 mm Uterine Serosa Involvement: Not identified Cervical stromal involvement: Not identified Extent of involvement of other organs: Not applicable Lymphovascular invasion: Not identified Regional Lymph Nodes: Examined: 0 Sentinel 0 Non-sentinel 0 Total Tumor block for ancillary studies: 1H MMR / MSI testing: Pending Pathologic Stage Classification (pTNM, AJCC 8th edition): pT1a, pNX (v4.1.0.0) 2. OVARY or FALLOPIAN TUBE or PRIMARY PERITONEUM: Procedure: Salpingo-oophorectomy Specimen Integrity: Intact Tumor Site: Right ovary Ovarian Surface Involvement (required only if applicable): Focally involved by carcinoma Fallopian Tube Surface Involvement (required only if applicable): Not identified Tumor Size: 12 cm Histologic Type: Endometrioid adenocarcinoma Histologic Grade: Grade II Implants (required for advanced stage serous/seromucinous borderline tumors only): Not applicable Other Tissue/ Organ Involvement: Cul de sac biopsy involved by tumor Largest Extrapelvic Peritoneal Focus (required only if applicable): Not applicable Peritoneal/Ascitic Fluid: Negative for carcinoma (case # [id number]) Treatment Effect (required only for high-grade serous carcinomas): Not applicable Regional Lymph Nodes: No lymph nodes submitted or found Number of Lymph Nodes Examined: 0 Pathologic Stage Classification (pTNM, AJCC 8th Edition): pT2b, pN0 Representative Tumor Block: 2B and 2E 1. Molecular study for microsatellite instability and immunohistochemical stains for MMR-related proteins are pending and will be reported in an addendum. 2. Immunohistochemical stain show that the ovarian tumor is positive for CK7 and PAX8 (both diffuse), CDX2 (patchy and weak); and negative for CK20. This immunoprofile is consistent with the above diagnosis. Dr. Lyndon Code has reviewed this case and concurs  with the above diagnosis. Molecular study for microsatellite instability and immunohistochemical stains for MMR-related proteins are pending and will be reported in an addendum   08/30/2017 Genetic Testing   Patient has genetic testing done for MSI  Results revealed patient has the following mutation(s): loss of [facility] 2   08/30/2017 Surgery   Surgeon: Mart Piggs, MD Pre-operative Diagnosis:  1. Adnexal mass 2. Abnormal uterine bleeding 3. H/o Cecal CA  Post-operative Diagnosis:  1. Adhesive disease post colon resection 2. Endometrial cancer NOS 3. Adenocarcinoma unknown origin, right ovary, suspicious for GI primary  Operation:  1. Lysis of adhesions ~30 minutes 2. Robotic-assisted laparoscopic total hysterectomy with right salpingo-oophorectomy and left salpingectomy 3. Left oophorectomy (RA-laparoscopic) 4. Pelvic washings  Findings: Adhesions of omentum to anterior abdominal wall. Enlarged cystic right ovary ~10cm. Uterus had small nodules on serosa near where right adnexa was intimate with the surface. No obvious intraoperative rupture of cyst, although in 2 areas the wall was thin and one of these areas had some bleeding. Slight scarring of left bladder dome to LUS/cervix. Uterus on frozen section c/w hyperplasia and a small focus of endometrial CA - no myo invasion, <2cm in size. Frozen section on the right adnexa was carcinoma, met from colon or possibly Gyn, favor GI primary, defer to permanent. Left ovary was WNL.     09/15/2017 Cancer Staging   Staging form: Corpus Uteri - Carcinoma and Carcinosarcoma, AJCC 8th Edition - Pathologic: FIGO Stage IA (pT1a, pN0, cM0) - Signed by Heath Lark, MD on 09/15/2017   09/26/2017 Procedure   Successful placement of a right internal jugular approach power injectable Port-A-Cath. The catheter is ready for immediate use.   11/07/2017 Imaging   1. Since 08/18/2017, similar to slight decrease in size of a pancreatic body/tail junction  lesion. Cross modality comparison relative to 09/16/2017 MRI is also  grossly similar. 2. No evidence of metastatic disease. 3. Aortic Atherosclerosis (ICD10-I70.0).  4. Left nephrolithiasis.   11/18/2017 Genetic Testing   MSH2 c.2237dupT pathogenic mutation identified in the CancerNext panel.  The CancerNext gene panel offered by [employer] includes sequencing and rearrangement analysis for the following 34 genes:   APC, ATM, BARD1, BMPR1A, BRCA1, BRCA2, BRIP1, CDH1, CDK4, CDKN2A, CHEK2, DICER1, HOXB13, EPCAM, GREM1, MLH1, MRE11A, MSH2, MSH6, MUTYH, NBN, NF1, PALB2, PMS2, POLD1, POLE, PTEN, RAD50, RAD51C, RAD51D, SMAD4, SMARCA4, STK11, and TP53.  The report date is November 18, 2017.  MSH2 [id number] pathogenic mutation identified on somatic testing.  These results are consistent with a diagnosis of Lynch syndrome.   06/18/2019 - 08/14/2019 Chemotherapy   The patient had FOLFIRINOX for chemotherapy treatment.     Secondary malignant neoplasm of right ovary ([place])  08/23/2017 Tumor Marker   Patient's tumor was tested for the following markers: CA-125 Results of the tumor marker test revealed 139.8   08/31/2017 Initial Diagnosis   Secondary malignant neoplasm of right ovary ([place])   09/15/2017 Cancer Staging   Staging form: Ovary, Fallopian Tube, and Primary Peritoneal Carcinoma, AJCC 8th Edition - Pathologic: Stage IIB (pT2b, pN0, cM0) - Signed by Heath Lark, MD on 09/15/2017   09/27/2017 Imaging   No evidence of metastatic disease or other acute findings within the thorax.  4 cm low-attenuation mass in pancreatic tail, highly suspicious for pancreatic carcinoma. This is caused splenic vein thrombosis, with new venous collaterals in the left upper quadrant. Consider endoscopic ultrasound with FNA for tissue diagnosis.  Stable benign hepatic hemangioma.    09/27/2017 Tumor Marker   Patient's tumor was tested for the following markers: CA-125 Results of the tumor marker test  revealed 39.4   11/02/2017 Tumor Marker   Patient's tumor was tested for the following markers: CA-125 Results of the tumor marker test revealed 19   11/07/2017 Tumor Marker   Patient's tumor was tested for the following markers: CA-125 Results of the tumor marker test revealed 18.3   11/18/2017 Genetic Testing   MSH2 c.2237dupT pathogenic mutation identified in the CancerNext panel.  The CancerNext gene panel offered by [employer] includes sequencing and rearrangement analysis for the following 34 genes:   APC, ATM, BARD1, BMPR1A, BRCA1, BRCA2, BRIP1, CDH1, CDK4, CDKN2A, CHEK2, DICER1, HOXB13, EPCAM, GREM1, MLH1, MRE11A, MSH2, MSH6, MUTYH, NBN, NF1, PALB2, PMS2, POLD1, POLE, PTEN, RAD50, RAD51C, RAD51D, SMAD4, SMARCA4, STK11, and TP53.  The report date is November 18, 2017.  MSH2 [id number] pathogenic mutation identified on somatic testing.  These results are consistent with a diagnosis of Lynch syndrome.   12/15/2017 Tumor Marker   Patient's tumor was tested for the following markers: CA-125 Results of the tumor marker test revealed 57.3   01/16/2018 Tumor Marker   Patient's tumor was tested for the following markers: CA-125 Results of the tumor marker test revealed 24.2   04/10/2018 Tumor Marker   Patient's tumor was tested for the following markers: CA-125 Results of the tumor marker test revealed 18.2   07/12/2018 Tumor Marker   Patient's tumor was tested for the following markers: CA-125 Results of the tumor marker test revealed 11.8   10/16/2018 Tumor Marker   Patient's tumor was tested for the following markers: CA125 Results of the tumor marker test revealed 12.4.   Malignant neoplasm of tail of pancreas ([place])  10/13/2017 Pathology Results   Pancreas tail mass, endoscopic ultrasound-guided, fine needle aspiration II (smears and cell block): Adenocarcinoma   11/18/2017 Genetic Testing  MSH2 c.[id number] pathogenic mutation identified in the CancerNext  panel.  The CancerNext gene panel offered by [employer] includes sequencing and rearrangement analysis for the following 34 genes:   APC, ATM, BARD1, BMPR1A, BRCA1, BRCA2, BRIP1, CDH1, CDK4, CDKN2A, CHEK2, DICER1, HOXB13, EPCAM, GREM1, MLH1, MRE11A, MSH2, MSH6, MUTYH, NBN, NF1, PALB2, PMS2, POLD1, POLE, PTEN, RAD50, RAD51C, RAD51D, SMAD4, SMARCA4, STK11, and TP53.  The report date is November 18, 2017.  MSH2 [id number] pathogenic mutation identified on somatic testing.  These results are consistent with a diagnosis of Lynch syndrome.   11/24/2017 Pathology Results   A. "TAIL OF PANCREAS AND SPLEEN", DISTAL PANCREATECTOMY AND SPLENECTOMY: Invasive ductal adenocarcinoma, moderately to poorly differentiated with focal signet ring cell features, of pancreas (distal).   The carcinoma is 2.5 cm in greatest dimension grossly.   Treatment effects present in the form of fibrosis (50%). No lymphovascular or definite perineural invasion identified. All surgical margins are negative for tumor or high-grade dysplasia. Adjacent mucinous neoplasm, most consistent with Intraductal papillary mucinous neoplasm (IPMN) with low-grade dysplasia.   Uninvolved pancreas show atrophy and focal acute inflammation.   Twelve benign lymph nodes (0/12). Spleen with no significant histopathologic abnormalities.  PROCEDURE: distal pancreatectomy and splenectomy TUMOR SITE: distal pancreas TUMOR SIZE:  GREATEST DIMENSION: 2.5 cm  ADDITIONAL DIMENSIONS:  x  cm HISTOLOGIC TYPE: ductal adenocarcinoma HISTOLOGIC GRADE: grade 3 TUMOR EXTENSION: peripancreatic soft tissue MARGINS: negative for tumor TREATMENT EFFECT: treatment effects present in the form of fibrosis (50%). LYMPHOVASCULAR INVASION: not identified PERINEURAL INVASION: no definite evidence REGIONAL LYMPH NODES:   NUMBER OF LYMPH NODES INVOLVED: 0   NUMBER OF LYMPH NODES EXAMINED:  12 PATHOLOGIC STAGE CLASSIFICATION (pTNM, AJCC 8th Ed): ypT2, ypN0 DISTANT METASTASIS (pM): pMx ADDITIONAL PATHOLOGIC FINDINGS: mucinous neoplasm, most consistent with intraductal papillary mucinous neoplasm (IPMN) with low-grade dysplasia, is identified adjacent to the main tumor.   11/24/2017 Cancer Staging   Staging form: Exocrine Pancreas, AJCC 8th Edition - Pathologic stage from 11/24/2017: Stage IB (pT2, pN0, cM0) - Signed by Truitt Merle, MD on 01/03/2018   11/25/2017 Surgery   She had surgery at [facility] 1. Exploratory Laparotomy 2. Distal Pancreatectomy and Splenectomy 3. Intraoperative Ultrasound 4. Open Cholecystectomy    12/15/2017 Cancer Staging   Staging form: Exocrine Pancreas, AJCC 8th Edition - Clinical: Stage IB (cT2, cN0, cM0) - Signed by Heath Lark, MD on 12/15/2017   12/28/2017 Imaging   12/28/2017 CT Abdomen  IMPRESSION: 1. Postoperative findings from recent partial pancreatectomy including a 21 cubic cm fluid collection along the pancreatic resection margin which could represent early pseudocyst. 2. Nodularity along the lateral limb of the left adrenal gland could also be postoperative but merit surveillance, as the pancreatic lesion was in close proximity to this adrenal gland on the prior CT of 11/07/2017. 3. Asymmetric fullness inferiorly in the left breast. The patient has a history of prior left breast procedures, correlation with mammography is recommended. 4. Other imaging findings of potential clinical significance: Stable hemangioma in the left hepatic lobe. Splenectomy. Aortic Atherosclerosis (ICD10-I70.0). Right hemicolectomy. Small focus of fat necrosis in the right anterior abdominal wall subcutaneous tissues near the laparotomy site. Bilateral nonobstructive nephrolithiasis.   01/02/2018 Tumor Marker   Patient's tumor was tested for the following markers: CA-19-9 Results of the tumor marker test revealed 5   01/03/2018 - 06/05/2018 Chemotherapy    She received modified dose FOLFIRINOX   04/10/2018 Imaging   1. Distal pancreatectomy, without findings of recurrent or metastatic disease. 2. Incompletely imaged  hypoenhancement within lower lobe right pulmonary artery branch is likely chronic (but interval since 10/09/2017) pulmonary embolism. Dedicated CTA could further evaluate. 3. Decrease in size of a peripancreatic complex fluid collection anteriorly, likely a resolving pseudocyst. 4. Decreased size of minimal fluid versus a borderline sized node in the gastrohepatic ligament. Recommend attention on follow-up. 5. Hepatic steatosis with a segment 4 hemangioma. 6. Aortic Atherosclerosis (ICD10-I70.0). This is significantly age advanced.   07/12/2018 Tumor Marker   Patient's tumor was tested for the following markers: CA-19-9 Results of the tumor marker test revealed 6   07/12/2018 Imaging   1. Status post distal pancreatectomy with stable postoperative fluid collection adjacent to the ventral aspect of the pancreatic head. No findings to suggest metastatic disease in the abdomen or pelvis. 2. Hepatic steatosis with small cavernous hemangioma in segment 4A of the liver. 3. Slight decreased size of nonenlarged gastrohepatic ligament lymph node, presumably benign. 4. Aortic atherosclerosis.    10/16/2018 Imaging   Status post distal pancreatectomy. Stable postoperative seroma along the surgical margin.   Status post hysterectomy and right oophorectomy.   Status post right hemicolectomy with appendectomy.   No evidence of recurrent or metastatic disease.   No colonic wall thickening or mass is evident on CT.   01/22/2019 Tumor Marker   Patient's tumor was tested for the following markers: CA-19.9 Results of the tumor marker test revealed 5   01/22/2019 Imaging   1. No evidence of local recurrence or metastatic disease status post distal pancreatectomy and splenectomy. 2. Stable small seroma anterior to the pancreatic head. 3.  Postsurgical changes as described. 4. Stable additional incidental findings including a hepatic hemangioma, nonobstructing bilateral renal calculi and aortic Atherosclerosis (ICD10-I70.0).   04/18/2019 Imaging   1. Status post distal pancreatectomy and splenectomy. There has been a gradual increase in ill-defined soft tissue and celiac axis/SMA origin lymph nodes adjacent to the distal pancreatectomy margin and lesser curvature of the stomach/gastric body over sequential prior examinations dated 01/22/2019 and 09/26/2018. An enlarged lymph node or soft tissue nodule adjacent to the SMA origin now measures 1.8 x 0.8 cm. Findings are concerning for local recurrence of pancreatic malignancy. 2. Separately from the above findings, there remains a 1.0 cm low-attenuation nodule anterior to the remnant pancreatic neck, most consistent with postoperative seroma 3. No evidence of distant metastatic disease in the chest, abdomen, or pelvis. 4. Postoperative findings of cholecystectomy, right hemicolectomy, and hysterectomy. 5. Bilateral nonobstructive nephrolithiasis. 6.  Aortic Atherosclerosis (ICD10-I70.0).       05/28/2019 Tumor Marker   Patient's tumor was tested for the following markers: CA19-9 Results of the tumor marker test revealed 18   05/28/2019 Imaging   1. Status post distal pancreatectomy with continued further progression of the ill-defined soft tissue to the left of the celiac axis/SMA origin, now measuring 1.9 x 1.9 cm and highly concerning for recurrent/metastatic disease. This process abuts both the celiac axis, SMA, and posterior wall of the mid stomach. PET-CT may prove helpful to further evaluate. 2. Bandlike ill-defined soft tissue in the anterior abdomen, potentially peritoneal or omental is similar to perhaps minimally increased qualitatively in the interval. Close attention on follow-up recommended. 3. Left paraumbilical hernia contains a short segment of small bowel without  complicating features. 4. Ill-defined very subtle area of decreased attenuation in the posterior aspect of hepatic segment IV. This is likely focal fatty deposition. Close attention on follow-up recommended. 5.  Aortic Atherosclerois (ICD10-170.0)   06/07/2019 Pathology Results   Malignant cells  consistent with adenocarcinoma   06/07/2019 Procedure   ENDOSCOPIC FINDING (limited views with linear echoendoscope): :      The examined esophagus was endoscopically normal.      The entire examined stomach was endoscopically normal.      ENDOSONOGRAPHIC FINDING: :      1. An irregular mass was identified in the region of the pancreatic tail resection site. The mass was stellate with very poorly defined borders. Fine needle aspiration for cytology was performed. Color Doppler imaging was utilized prior to needle puncture to confirm a lack of significant vascular structures within the needle path. Four passes were made with the 25 gauge (FNB) needle using a transgastric approach. A cytotechnologist was present to evaluate the adequacy of the specimen. Final cytology results are pending. Impression:               -  Irregular, stellate soft tissue mass in the region of the pancreatic tail resection site. This was sampled with four transgastric passes with an EUS FNB needle..   06/18/2019 - 08/14/2019 Chemotherapy   The patient had FOLFIRINOX for chemotherapy treatment.     09/03/2019 Imaging   1. Findings are again highly concerning for locally recurrent disease adjacent to the pancreatectomy bed, with enlarging soft tissue mass which is intimately associated with the superior mesenteric artery, celiac axis and superior aspect of the left renal  vein, as detailed above. 2. No evidence of metastatic disease in the thorax. 3. 2.7 x 1.2 cm cavernous hemangioma in segment 4A of the liver again noted. 4. Small nonobstructive calculi in the collecting systems of both kidneys measuring up to 3 mm in the lower pole  collecting system of the left kidney. 5. Aortic atherosclerosis. 6. Additional incidental findings, as above.   11/16/2019 Imaging   Similar-appearing ill-defined soft tissue the left of the celiac axis/SMA, most compatible with recurrent/metastatic disease.   Similar ill-defined nodularity within the anterior upper abdomen, potentially peritoneal or omental. Continued attention on follow-up is recommended.     REVIEW OF SYSTEMS:   Constitutional: Denies fevers, chills or abnormal weight loss Eyes: Denies blurriness of vision Ears, nose, mouth, throat, and face: Denies mucositis or sore throat Respiratory: Denies cough, dyspnea or wheezes Cardiovascular: Denies palpitation, chest discomfort or lower extremity swelling Gastrointestinal:  Denies nausea, heartburn or change in bowel habits Skin: Denies abnormal skin rashes Lymphatics: Denies new lymphadenopathy or easy bruising Behavioral/Psych: Mood is stable, no new changes  All other systems were reviewed with the patient and are negative.  I have reviewed the past medical history, past surgical history, social history and family history with the patient and they are unchanged from previous note.  ALLERGIES:  is allergic to codeine, penicillins, and bactrim [sulfamethoxazole-trimethoprim].  MEDICATIONS:  Current Outpatient Medications  Medication Sig Dispense Refill  . HYDROmorphone (DILAUDID) 4 MG tablet Take 1 tablet (4 mg total) by mouth every 4 (four) hours as needed. 60 tablet 0  . cetirizine (ZYRTEC) 10 MG tablet Take 10 mg by mouth daily.    . citalopram (CELEXA) 20 MG tablet TAKE 1 TABLET BY MOUTH EVERY DAY 90 tablet 4  . CREON 24000-76000 units CPEP Take 1 capsule by mouth See admin instructions. Take 1 capsule before each meal and 1 capsule after each meal, take 1 cap with snacks    . diclofenac Sodium (VOLTAREN) 1 % GEL APPLY 2-4 GRAMS TO AFFECTED JOINT 4 TIMES DAILY AS NEEDED. 400 g 1  . estradiol (ESTRACE VAGINAL) 0.1  MG/GM vaginal cream Place 1 Applicatorful vaginally 3 (three) times a week. 42.5 g 12  . fluticasone (FLONASE) 50 MCG/ACT nasal spray Place 1 spray into both nostrils daily.    . folic acid (FOLVITE) 1 MG tablet TAKE 2 TABLETS BY MOUTH EVERY DAY 180 tablet 3  . gabapentin (NEURONTIN) 300 MG capsule Take 1 capsule (300 mg total) by mouth 3 (three) times daily. 90 capsule 11  . hydrOXYzine (ATARAX/VISTARIL) 10 MG tablet Take 1 tablet (10 mg total) by mouth 3 (three) times daily as needed. 30 tablet 1  . lidocaine-prilocaine (EMLA) cream Apply 1 application topically daily as needed (port). Apply to affected area once 30 g 11  . LORazepam (ATIVAN) 0.5 MG tablet Take 1 tablet (0.5 mg total) by mouth every 8 (eight) hours as needed for anxiety (nausea). 30 tablet 0  . metFORMIN (GLUCOPHAGE) 500 MG tablet TAKE 1 TABLET BY MOUTH TWICE A DAY WITH A MEAL 180 tablet 11  . methocarbamol (ROBAXIN) 500 MG tablet TAKE 1 TABLET BY MOUTH 3 TIMES DAILY AS NEEDED FOR MUSCLE SPASMS. (Patient taking differently: Take 500 mg by mouth every 8 (eight) hours as needed for muscle spasms. ) 90 tablet 0  . Multiple Vitamin (MULTIVITAMIN WITH MINERALS) TABS tablet Take 1 tablet by mouth daily.    Marland Kitchen omeprazole (PRILOSEC) 40 MG capsule Take 1 capsule (40 mg total) by mouth daily. 30 capsule 11  . ondansetron (ZOFRAN) 8 MG tablet TAKE 1 TAB BY MOUTH EVERY 8HOURS AS NEEDED FOR REFRACTORY NAUSEA/VOMITING START ON DAY 3AFTER CHEMO 8 tablet 7  . predniSONE (DELTASONE) 5 MG tablet TAKE 2 TABLETS (10 MG TOTAL) BY MOUTH DAILY WITH BREAKFAST. 60 tablet 0  . prochlorperazine (COMPAZINE) 10 MG tablet Take 1 tablet (10 mg total) by mouth every 6 (six) hours as needed. 90 tablet 3  . rivaroxaban (XARELTO) 20 MG TABS tablet Take 1 tablet (20 mg total) by mouth daily with supper. 30 tablet 9  . simethicone (MYLICON) 782 MG chewable tablet Chew 125 mg by mouth every 6 (six) hours as needed for flatulence.     No current facility-administered  medications for this visit.    PHYSICAL EXAMINATION: ECOG PERFORMANCE STATUS: 1 - Symptomatic but completely ambulatory  Vitals:   11/20/19 1004  BP: (!) 141/57  Pulse: 65  Resp: 18  Temp: (!) 97.5 F (36.4 C)  SpO2: 100%   Filed Weights   11/20/19 1004  Weight: 198 lb (89.8 kg)    GENERAL:alert, no distress and comfortable NEURO: alert & oriented x 3 with fluent speech, no focal motor/sensory deficits  LABORATORY DATA:  I have reviewed the data as listed    Component Value Date/Time   NA 136 11/09/2019 1012   K 4.4 11/09/2019 1012   CL 101 11/09/2019 1012   CO2 27 11/09/2019 1012   GLUCOSE 101 (H) 11/09/2019 1012   BUN 13 11/09/2019 1012   CREATININE 0.67 11/09/2019 1012   CREATININE 0.65 09/27/2019 1535   CREATININE 0.68 07/27/2017 0851   CALCIUM 9.4 11/09/2019 1012   PROT 7.2 11/09/2019 1012   ALBUMIN 3.5 11/09/2019 1012   AST 19 11/09/2019 1012   AST 62 (H) 09/27/2019 1535   ALT 30 11/09/2019 1012   ALT 60 (H) 09/27/2019 1535   ALKPHOS 62 11/09/2019 1012   BILITOT <0.2 (L) 11/09/2019 1012   BILITOT <0.2 (L) 09/27/2019 1535   GFRNONAA >60 11/09/2019 1012   GFRNONAA >60 09/27/2019 1535   GFRNONAA 106 07/27/2017 0851  GFRAA >60 11/09/2019 1012   GFRAA >60 09/27/2019 1535   GFRAA 123 07/27/2017 0851    No results found for: SPEP, UPEP  Lab Results  Component Value Date   WBC 13.9 (H) 11/09/2019   NEUTROABS 11.9 (H) 11/09/2019   HGB 12.1 11/09/2019   HCT 37.8 11/09/2019   MCV 88.5 11/09/2019   PLT 496 (H) 11/09/2019      Chemistry      Component Value Date/Time   NA 136 11/09/2019 1012   K 4.4 11/09/2019 1012   CL 101 11/09/2019 1012   CO2 27 11/09/2019 1012   BUN 13 11/09/2019 1012   CREATININE 0.67 11/09/2019 1012   CREATININE 0.65 09/27/2019 1535   CREATININE 0.68 07/27/2017 0851      Component Value Date/Time   CALCIUM 9.4 11/09/2019 1012   ALKPHOS 62 11/09/2019 1012   AST 19 11/09/2019 1012   AST 62 (H) 09/27/2019 1535   ALT 30  11/09/2019 1012   ALT 60 (H) 09/27/2019 1535   BILITOT <0.2 (L) 11/09/2019 1012   BILITOT <0.2 (L) 09/27/2019 1535       RADIOGRAPHIC STUDIES: I have reviewed multiple imaging studies with the patient I have personally reviewed the radiological images as listed and agreed with the findings in the report. CT ABDOMEN PELVIS W CONTRAST  Result Date: 11/17/2019 CLINICAL DATA:  Patient with history of pancreatic carcinoma. Follow-up exam. EXAM: CT ABDOMEN AND PELVIS WITH CONTRAST TECHNIQUE: Multidetector CT imaging of the abdomen and pelvis was performed using the standard protocol following bolus administration of intravenous contrast. CONTRAST:  14m OMNIPAQUE IOHEXOL 300 MG/ML  SOLN COMPARISON:  CT abdomen pelvis 09/03/2019. FINDINGS: Lower chest: Normal heart size. Lung bases are clear. No pleural effusion. Hepatobiliary: Liver is normal in size and contour. Redemonstrated hemangioma with subcapsular segment 4A (image 23; series 2). Prior cholecystectomy. No intrahepatic or extrahepatic biliary ductal dilatation. Pancreas: Postsurgical changes compatible with distal pancreatectomy. Adjacent to the surgical site there is a 3.1 x 1.7 cm heterogeneous enhancing soft tissue mass (image 51; series 7), previously 3.2 x 1.7 cm. This lesion makes contact with the left lateral aspect the celiac axis and superior mesenteric artery. Spleen: Surgically absent Adrenals/Urinary Tract: Normal adrenal glands. Kidneys enhance symmetrically with contrast. No hydronephrosis. Urinary bladder is unremarkable. Redemonstrated nonobstructing stone inferior pole left kidney. Stomach/Bowel: Status post right hemicolectomy. No abnormal bowel wall thickening or evidence for bowel obstruction. No free fluid or free intraperitoneal air. Normal morphology of the stomach. Vascular/Lymphatic: Normal caliber abdominal aorta. No retroperitoneal lymphadenopathy. Reproductive: Prior hysterectomy. Other: Fat containing left paraumbilical  hernia. Similar-appearing nodularity within the anterior upper abdomen measuring up to 5 mm (image 39; series 7). Musculoskeletal: No aggressive or acute appearing osseous lesions. IMPRESSION: Similar-appearing ill-defined soft tissue the left of the celiac axis/SMA, most compatible with recurrent/metastatic disease. Similar ill-defined nodularity within the anterior upper abdomen, potentially peritoneal or omental. Continued attention on follow-up is recommended. Electronically Signed   By: DLovey NewcomerM.D.   On: 11/17/2019 18:11

## 2019-11-20 NOTE — Assessment & Plan Note (Signed)
We will follow with CT imaging She has completed adjuvant therapy 

## 2019-12-12 ENCOUNTER — Encounter: Payer: Self-pay | Admitting: Hematology and Oncology

## 2019-12-12 ENCOUNTER — Encounter: Payer: Self-pay | Admitting: Rheumatology

## 2019-12-13 ENCOUNTER — Other Ambulatory Visit: Payer: Self-pay | Admitting: Hematology and Oncology

## 2019-12-13 MED ORDER — METHOCARBAMOL 500 MG PO TABS
ORAL_TABLET | ORAL | 0 refills | Status: DC
Start: 2019-12-13 — End: 2020-01-21

## 2019-12-13 MED ORDER — HYDROMORPHONE HCL 4 MG PO TABS
4.0000 mg | ORAL_TABLET | ORAL | 0 refills | Status: DC | PRN
Start: 2019-12-13 — End: 2019-12-28

## 2019-12-13 MED ORDER — PREDNISONE 5 MG PO TABS
10.0000 mg | ORAL_TABLET | Freq: Every day | ORAL | 0 refills | Status: DC
Start: 2019-12-13 — End: 2020-01-07

## 2019-12-13 NOTE — Telephone Encounter (Signed)
Last Visit: 11/01/2019 Next Visit: 02/07/2020  Current Dose per office note on 11/01/2019: prednisone 10 mg p.o. daily   Okay to refill Prednisone and Methocarbamol?

## 2019-12-28 ENCOUNTER — Other Ambulatory Visit: Payer: Self-pay

## 2019-12-28 ENCOUNTER — Inpatient Hospital Stay: Payer: BC Managed Care – PPO

## 2019-12-28 ENCOUNTER — Encounter: Payer: Self-pay | Admitting: Hematology and Oncology

## 2019-12-28 ENCOUNTER — Other Ambulatory Visit: Payer: Self-pay | Admitting: Hematology and Oncology

## 2019-12-28 ENCOUNTER — Inpatient Hospital Stay: Payer: BC Managed Care – PPO | Attending: Hematology and Oncology | Admitting: Hematology and Oncology

## 2019-12-28 ENCOUNTER — Other Ambulatory Visit: Payer: Self-pay | Admitting: Obstetrics and Gynecology

## 2019-12-28 DIAGNOSIS — Z7901 Long term (current) use of anticoagulants: Secondary | ICD-10-CM | POA: Diagnosis not present

## 2019-12-28 DIAGNOSIS — E098 Drug or chemical induced diabetes mellitus with unspecified complications: Secondary | ICD-10-CM | POA: Diagnosis not present

## 2019-12-28 DIAGNOSIS — C7961 Secondary malignant neoplasm of right ovary: Secondary | ICD-10-CM

## 2019-12-28 DIAGNOSIS — Z7984 Long term (current) use of oral hypoglycemic drugs: Secondary | ICD-10-CM | POA: Diagnosis not present

## 2019-12-28 DIAGNOSIS — M069 Rheumatoid arthritis, unspecified: Secondary | ICD-10-CM | POA: Insufficient documentation

## 2019-12-28 DIAGNOSIS — I2699 Other pulmonary embolism without acute cor pulmonale: Secondary | ICD-10-CM | POA: Insufficient documentation

## 2019-12-28 DIAGNOSIS — Z5189 Encounter for other specified aftercare: Secondary | ICD-10-CM | POA: Insufficient documentation

## 2019-12-28 DIAGNOSIS — Z5111 Encounter for antineoplastic chemotherapy: Secondary | ICD-10-CM | POA: Diagnosis not present

## 2019-12-28 DIAGNOSIS — C252 Malignant neoplasm of tail of pancreas: Secondary | ICD-10-CM | POA: Insufficient documentation

## 2019-12-28 DIAGNOSIS — T380X5D Adverse effect of glucocorticoids and synthetic analogues, subsequent encounter: Secondary | ICD-10-CM

## 2019-12-28 DIAGNOSIS — G893 Neoplasm related pain (acute) (chronic): Secondary | ICD-10-CM | POA: Insufficient documentation

## 2019-12-28 DIAGNOSIS — E099 Drug or chemical induced diabetes mellitus without complications: Secondary | ICD-10-CM

## 2019-12-28 DIAGNOSIS — Z1231 Encounter for screening mammogram for malignant neoplasm of breast: Secondary | ICD-10-CM

## 2019-12-28 DIAGNOSIS — C541 Malignant neoplasm of endometrium: Secondary | ICD-10-CM

## 2019-12-28 DIAGNOSIS — R300 Dysuria: Secondary | ICD-10-CM

## 2019-12-28 DIAGNOSIS — Z7189 Other specified counseling: Secondary | ICD-10-CM

## 2019-12-28 DIAGNOSIS — M0609 Rheumatoid arthritis without rheumatoid factor, multiple sites: Secondary | ICD-10-CM

## 2019-12-28 LAB — CBC WITH DIFFERENTIAL/PLATELET
Abs Immature Granulocytes: 0.08 10*3/uL — ABNORMAL HIGH (ref 0.00–0.07)
Basophils Absolute: 0.1 10*3/uL (ref 0.0–0.1)
Basophils Relative: 1 %
Eosinophils Absolute: 0.2 10*3/uL (ref 0.0–0.5)
Eosinophils Relative: 1 %
HCT: 36.8 % (ref 36.0–46.0)
Hemoglobin: 11.8 g/dL — ABNORMAL LOW (ref 12.0–15.0)
Immature Granulocytes: 1 %
Lymphocytes Relative: 9 %
Lymphs Abs: 1.2 10*3/uL (ref 0.7–4.0)
MCH: 28.1 pg (ref 26.0–34.0)
MCHC: 32.1 g/dL (ref 30.0–36.0)
MCV: 87.6 fL (ref 80.0–100.0)
Monocytes Absolute: 0.9 10*3/uL (ref 0.1–1.0)
Monocytes Relative: 6 %
Neutro Abs: 11.5 10*3/uL — ABNORMAL HIGH (ref 1.7–7.7)
Neutrophils Relative %: 82 %
Platelets: 510 10*3/uL — ABNORMAL HIGH (ref 150–400)
RBC: 4.2 MIL/uL (ref 3.87–5.11)
RDW: 17.2 % — ABNORMAL HIGH (ref 11.5–15.5)
WBC: 14 10*3/uL — ABNORMAL HIGH (ref 4.0–10.5)
nRBC: 0 % (ref 0.0–0.2)

## 2019-12-28 LAB — COMPREHENSIVE METABOLIC PANEL
ALT: 37 U/L (ref 0–44)
AST: 25 U/L (ref 15–41)
Albumin: 3.6 g/dL (ref 3.5–5.0)
Alkaline Phosphatase: 83 U/L (ref 38–126)
Anion gap: 10 (ref 5–15)
BUN: 12 mg/dL (ref 6–20)
CO2: 25 mmol/L (ref 22–32)
Calcium: 9.1 mg/dL (ref 8.9–10.3)
Chloride: 104 mmol/L (ref 98–111)
Creatinine, Ser: 0.62 mg/dL (ref 0.44–1.00)
GFR, Estimated: >60 mL/min (ref >60)
Glucose, Bld: 88 mg/dL (ref 70–99)
Potassium: 4.3 mmol/L (ref 3.5–5.1)
Sodium: 139 mmol/L (ref 135–145)
Total Bilirubin: 0.2 mg/dL — ABNORMAL LOW (ref 0.3–1.2)
Total Protein: 7.2 g/dL (ref 6.5–8.1)

## 2019-12-28 MED ORDER — HYDROMORPHONE HCL 4 MG PO TABS
4.0000 mg | ORAL_TABLET | ORAL | 0 refills | Status: DC | PRN
Start: 2019-12-28 — End: 2020-02-12

## 2019-12-28 MED ORDER — HEPARIN SOD (PORK) LOCK FLUSH 100 UNIT/ML IV SOLN
500.0000 [IU] | Freq: Once | INTRAVENOUS | Status: AC
  Administered 2019-12-28: 500 [IU]
  Filled 2019-12-28: qty 5

## 2019-12-28 MED ORDER — SODIUM CHLORIDE 0.9% FLUSH
10.0000 mL | Freq: Once | INTRAVENOUS | Status: AC
  Administered 2019-12-28: 10 mL
  Filled 2019-12-28: qty 10

## 2019-12-28 MED ORDER — METFORMIN HCL 500 MG PO TABS: ORAL_TABLET | ORAL | 11 refills | Status: DC

## 2019-12-28 NOTE — Assessment & Plan Note (Signed)
She will continue Xarelto for secondary prevention She denies bleeding complications

## 2019-12-28 NOTE — Assessment & Plan Note (Signed)
We have extensive discussions about chronic pain management She will continue prescribed pain medicine as needed

## 2019-12-28 NOTE — Assessment & Plan Note (Signed)
CT imaging showed no signs of cancer recurrence Her recent tumor marker is stable

## 2019-12-28 NOTE — Assessment & Plan Note (Signed)
She has chronic intermittent back pain at the area of her surgical resection site  Her last CT imaging in September showed stability of the abnormal mass seen as before Per previous discussion, for now, I do not plan to start her on any chemotherapy Her tumor marker is not helpful I plan to repeat CT imaging again next month for further follow-up

## 2019-12-28 NOTE — Progress Notes (Signed)
[place] OFFICE PROGRESS NOTE  Patient Care Team: [name], MD as PCP - General (Family Medicine)  ASSESSMENT & PLAN:  Malignant neoplasm of tail of pancreas [facility]) She has chronic intermittent back pain at the area of her surgical resection site  Her last CT imaging in September showed stability of the abnormal mass seen as before Per previous discussion, for now, I do not plan to start her on any chemotherapy Her tumor marker is not helpful I plan to repeat CT imaging again next month for further follow-up  Endometrial cancer [facility]) We will follow with CT imaging She has completed adjuvant therapy  Secondary malignant neoplasm of right ovary [facility]) CT imaging showed no signs of cancer recurrence Her recent tumor marker is stable  Cancer associated pain We have extensive discussions about chronic pain management She will continue prescribed pain medicine as needed  Pulmonary embolism ([place]) She will continue Xarelto for secondary prevention She denies bleeding complications  Rheumatoid arthritis of multiple sites with negative rheumatoid factor ([place]) Her arthritis pain is well managed with low-dose prednisone She will continue to treatment under the direction of her rheumatologist  Steroid-induced diabetes [facility]) She will continue Metformin   Orders Placed This Encounter  Procedures  . CT CHEST ABDOMEN PELVIS W CONTRAST    Standing Status:   Future    Standing Expiration Date:   12/27/2020    Order Specific Question:   Preferred imaging location?    Answer:   [facility]    Order Specific Question:   Radiology Contrast Protocol - do NOT remove file path    Answer:   [email or web address]    Order Specific Question:   Is patient pregnant?    Answer:   No    All questions were answered. The patient knows to call the clinic with any problems, questions or concerns. The total time spent in the appointment  was 30 minutes encounter with patients including review of chart and various tests results, discussions about plan of care and coordination of care plan   Heath Lark, MD 12/28/2019 11:40 AM  INTERVAL HISTORY: Please see below for problem oriented charting. She returns for further follow-up on Lynch syndrome with multiple malignancies, specifically, history of colon cancer, endometrial cancer, ovarian cancer and pancreatic cancer Her arthritis pain is well managed with 5 mg of prednisone. She did not tolerate the 10 mg dose because it caused insomnia She continues to take intermittent Dilaudid for her upper back pain related to her pancreatic cancer Denies recent constipation No recent infection, fever or chills She is up-to-date with her vaccination program The patient denies any recent signs or symptoms of bleeding such as spontaneous epistaxis, hematuria or hematochezia.  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 Mixed attenuation mass emanates from the right adnexa measuring 10.8 x 8.0 cm very suspicious for right ovarian carcinoma. 2. Abnormality of the tail of the pancreas may be due to mild pancreatitis and small pseudocyst formation, but neoplasm cannot be excluded. 3. Small amount of ascites within abdomen and pelvis. 4. Small nonobstructing renal calculi bilaterally.    08/30/2017 Pathology Results   1. Uterus and cervix, with left fallopian tube - ENDOMETRIOID ADENOCARCINOMA, FIGO GRADE I, ARISING IN A BACKGROUND OF DIFFUSE COMPLEX ATYPICAL HYPERPLASIA. - CARCINOMA  INVADES FOR OF DEPTH OF 0.2 CM WHERE THICKNESS OF MYOMETRIAL WALL IS 2.1 CM. - ALL RESECTION MARGINS ARE NEGATIVE FOR CARCINOMA. - NEGATIVE FOR LYMPHOVASCULAR OR PERINEURAL INVASION. -  CERVICAL STROMA IS NOT INVOLVED. - SEE ONCOLOGY TABLE. - SEE NOTE 2. Ovary and fallopian tube, right - PRIMARY OVARIAN ENDOMETRIOID ADENOCARCINOMA, FIGO GRADE II, 12 CM. - THE OVARIAN SURFACE IS FOCALLY INVOLVED BY CARCINOMA. - NEGATIVE FOR LYMPHOVASCULAR INVASION. - BENIGN UNREMARKABLE FALLOPIAN TUBE, NEGATIVE FOR CARCINOMA. - SEE ONCOLOGY TABLE. - SEE NOTE 3. Cul-de-sac biopsy - METASTATIC ADENOCARCINOMA, MOST CONSISTENT WITH PRIMARY OVARIAN ENDOMETRIOID ADENOCARCINOMA. 4. Ovary, left - BENIGN UNREMARKABLE OVARY, NEGATIVE FOR MALIGNANCY. Microscopic Comment 1. UTERUS, CARCINOMA OR CARCINOSARCOMA Procedure: Total hysterectomy with bilateral salpingo-oophorectomy. Histologic type: Endometrioid adenocarcinoma. Histologic Grade: FIGO Grade I Myometrial invasion: Depth of invasion: 2 mm Myometrial thickness: 21 mm Uterine Serosa Involvement: Not identified Cervical stromal involvement: Not identified Extent of involvement of other organs: Not applicable Lymphovascular invasion: Not identified Regional Lymph Nodes: Examined: 0 Sentinel 0 Non-sentinel 0 Total Tumor block for ancillary studies: 1H MMR / MSI testing: Pending Pathologic Stage Classification (pTNM, AJCC 8th edition): pT1a, pNX (v4.1.0.0) 2. OVARY or FALLOPIAN TUBE or PRIMARY PERITONEUM: Procedure: Salpingo-oophorectomy Specimen Integrity: Intact Tumor Site: Right ovary Ovarian Surface Involvement (required only if applicable): Focally involved by carcinoma Fallopian Tube Surface Involvement (required only if applicable): Not identified Tumor Size: 12 cm Histologic Type: Endometrioid adenocarcinoma Histologic Grade: Grade II Implants (required for advanced stage serous/seromucinous borderline tumors only): Not applicable Other Tissue/ Organ Involvement: Cul de sac biopsy involved by tumor Largest Extrapelvic Peritoneal Focus (required only if applicable): Not applicable Peritoneal/Ascitic Fluid: Negative for  carcinoma (case # [id number]) Treatment Effect (required only for high-grade serous carcinomas): Not applicable Regional Lymph Nodes: No lymph nodes submitted or found Number of Lymph Nodes Examined: 0 Pathologic Stage Classification (pTNM, AJCC 8th Edition): pT2b, pN0 Representative Tumor Block: 2B and 2E 1. Molecular study for microsatellite instability and immunohistochemical stains for MMR-related proteins are pending and will be reported in an addendum. 2. Immunohistochemical stain show that the ovarian tumor is positive for CK7 and PAX8 (both diffuse), CDX2 (patchy and weak); and negative for CK20. This immunoprofile is consistent with the above diagnosis. Dr. Lyndon Code has reviewed this case and concurs with the above diagnosis. Molecular study for microsatellite instability and immunohistochemical stains for MMR-related proteins are pending and will be reported in an addendum   08/30/2017 Genetic Testing   Patient has genetic testing done for MSI  Results revealed patient has the following mutation(s): loss of [facility] 2   08/30/2017 Surgery   Surgeon: Mart Piggs, MD Pre-operative Diagnosis:  1. Adnexal mass 2. Abnormal uterine bleeding 3. H/o Cecal CA  Post-operative Diagnosis:  1. Adhesive disease post colon resection 2. Endometrial cancer NOS 3. Adenocarcinoma unknown origin, right ovary, suspicious for GI primary  Operation:  1. Lysis of adhesions ~30 minutes 2. Robotic-assisted laparoscopic total hysterectomy with right salpingo-oophorectomy and left salpingectomy 3. Left oophorectomy (RA-laparoscopic) 4. Pelvic washings  Findings: Adhesions of omentum to anterior abdominal wall. Enlarged cystic right ovary ~10cm. Uterus had small nodules on serosa near where right adnexa was intimate with the surface. No obvious intraoperative rupture of cyst, although in 2 areas the wall was thin and one of these areas had some bleeding. Slight scarring of left bladder dome to  LUS/cervix. Uterus on frozen section c/w hyperplasia and a small focus of endometrial CA - no myo invasion, <2cm in size. Frozen section on  the right adnexa was carcinoma, met from colon or possibly Gyn, favor GI primary, defer to permanent. Left ovary was WNL.     09/15/2017 Cancer Staging   Staging form: Corpus Uteri - Carcinoma and Carcinosarcoma, AJCC 8th Edition - Pathologic: FIGO Stage IA (pT1a, pN0, cM0) - Signed by Heath Lark, MD on 09/15/2017   09/26/2017 Procedure   Successful placement of a right internal jugular approach power injectable Port-A-Cath. The catheter is ready for immediate use.   11/07/2017 Imaging   1. Since 08/18/2017, similar to slight decrease in size of a pancreatic body/tail junction lesion. Cross modality comparison relative to 09/16/2017 MRI is also grossly similar. 2. No evidence of metastatic disease. 3. Aortic Atherosclerosis (ICD10-I70.0).  4. Left nephrolithiasis.   11/18/2017 Genetic Testing   MSH2 c.2237dupT pathogenic mutation identified in the CancerNext panel.  The CancerNext gene panel offered by [employer] includes sequencing and rearrangement analysis for the following 34 genes:   APC, ATM, BARD1, BMPR1A, BRCA1, BRCA2, BRIP1, CDH1, CDK4, CDKN2A, CHEK2, DICER1, HOXB13, EPCAM, GREM1, MLH1, MRE11A, MSH2, MSH6, MUTYH, NBN, NF1, PALB2, PMS2, POLD1, POLE, PTEN, RAD50, RAD51C, RAD51D, SMAD4, SMARCA4, STK11, and TP53.  The report date is November 18, 2017.  MSH2 [id number] pathogenic mutation identified on somatic testing.  These results are consistent with a diagnosis of Lynch syndrome.   06/18/2019 - 08/14/2019 Chemotherapy   The patient had FOLFIRINOX for chemotherapy treatment.     Secondary malignant neoplasm of right ovary ([place])  08/23/2017 Tumor Marker   Patient's tumor was tested for the following markers: CA-125 Results of the tumor marker test revealed 139.8   08/31/2017 Initial Diagnosis   Secondary malignant neoplasm of right  ovary ([place])   09/15/2017 Cancer Staging   Staging form: Ovary, Fallopian Tube, and Primary Peritoneal Carcinoma, AJCC 8th Edition - Pathologic: Stage IIB (pT2b, pN0, cM0) - Signed by Heath Lark, MD on 09/15/2017   09/27/2017 Imaging   No evidence of metastatic disease or other acute findings within the thorax.  4 cm low-attenuation mass in pancreatic tail, highly suspicious for pancreatic carcinoma. This is caused splenic vein thrombosis, with new venous collaterals in the left upper quadrant. Consider endoscopic ultrasound with FNA for tissue diagnosis.  Stable benign hepatic hemangioma.    09/27/2017 Tumor Marker   Patient's tumor was tested for the following markers: CA-125 Results of the tumor marker test revealed 39.4   11/02/2017 Tumor Marker   Patient's tumor was tested for the following markers: CA-125 Results of the tumor marker test revealed 19   11/07/2017 Tumor Marker   Patient's tumor was tested for the following markers: CA-125 Results of the tumor marker test revealed 18.3   11/18/2017 Genetic Testing   MSH2 c.2237dupT pathogenic mutation identified in the CancerNext panel.  The CancerNext gene panel offered by [employer] includes sequencing and rearrangement analysis for the following 34 genes:   APC, ATM, BARD1, BMPR1A, BRCA1, BRCA2, BRIP1, CDH1, CDK4, CDKN2A, CHEK2, DICER1, HOXB13, EPCAM, GREM1, MLH1, MRE11A, MSH2, MSH6, MUTYH, NBN, NF1, PALB2, PMS2, POLD1, POLE, PTEN, RAD50, RAD51C, RAD51D, SMAD4, SMARCA4, STK11, and TP53.  The report date is November 18, 2017.  MSH2 [id number] pathogenic mutation identified on somatic testing.  These results are consistent with a diagnosis of Lynch syndrome.   12/15/2017 Tumor Marker   Patient's tumor was tested for the following markers: CA-125 Results of the tumor marker test revealed 57.3   01/16/2018 Tumor Marker   Patient's tumor was tested for the following markers: CA-125 Results of the  tumor marker test  revealed 24.2   04/10/2018 Tumor Marker   Patient's tumor was tested for the following markers: CA-125 Results of the tumor marker test revealed 18.2   07/12/2018 Tumor Marker   Patient's tumor was tested for the following markers: CA-125 Results of the tumor marker test revealed 11.8   10/16/2018 Tumor Marker   Patient's tumor was tested for the following markers: CA125 Results of the tumor marker test revealed 12.4.   Malignant neoplasm of tail of pancreas ([place])  10/13/2017 Pathology Results   Pancreas tail mass, endoscopic ultrasound-guided, fine needle aspiration II (smears and cell block): Adenocarcinoma   11/18/2017 Genetic Testing   MSH2 c.2237dupT pathogenic mutation identified in the CancerNext panel.  The CancerNext gene panel offered by [employer] includes sequencing and rearrangement analysis for the following 34 genes:   APC, ATM, BARD1, BMPR1A, BRCA1, BRCA2, BRIP1, CDH1, CDK4, CDKN2A, CHEK2, DICER1, HOXB13, EPCAM, GREM1, MLH1, MRE11A, MSH2, MSH6, MUTYH, NBN, NF1, PALB2, PMS2, POLD1, POLE, PTEN, RAD50, RAD51C, RAD51D, SMAD4, SMARCA4, STK11, and TP53.  The report date is November 18, 2017.  MSH2 [id number] pathogenic mutation identified on somatic testing.  These results are consistent with a diagnosis of Lynch syndrome.   11/24/2017 Pathology Results   A. "TAIL OF PANCREAS AND SPLEEN", DISTAL PANCREATECTOMY AND SPLENECTOMY: Invasive ductal adenocarcinoma, moderately to poorly differentiated with focal signet ring cell features, of pancreas (distal).   The carcinoma is 2.5 cm in greatest dimension grossly.   Treatment effects present in the form of fibrosis (50%). No lymphovascular or definite perineural invasion identified. All surgical margins are negative for tumor or high-grade dysplasia. Adjacent mucinous neoplasm, most consistent with Intraductal papillary mucinous neoplasm (IPMN) with low-grade dysplasia.    Uninvolved pancreas show atrophy and focal acute inflammation.   Twelve benign lymph nodes (0/12). Spleen with no significant histopathologic abnormalities.  PROCEDURE: distal pancreatectomy and splenectomy TUMOR SITE: distal pancreas TUMOR SIZE:  GREATEST DIMENSION: 2.5 cm  ADDITIONAL DIMENSIONS:  x  cm HISTOLOGIC TYPE: ductal adenocarcinoma HISTOLOGIC GRADE: grade 3 TUMOR EXTENSION: peripancreatic soft tissue MARGINS: negative for tumor TREATMENT EFFECT: treatment effects present in the form of fibrosis (50%). LYMPHOVASCULAR INVASION: not identified PERINEURAL INVASION: no definite evidence REGIONAL LYMPH NODES:   NUMBER OF LYMPH NODES INVOLVED: 0   NUMBER OF LYMPH NODES EXAMINED: 12 PATHOLOGIC STAGE CLASSIFICATION (pTNM, AJCC 8th Ed): ypT2, ypN0 DISTANT METASTASIS (pM): pMx ADDITIONAL PATHOLOGIC FINDINGS: mucinous neoplasm, most consistent with intraductal papillary mucinous neoplasm (IPMN) with low-grade dysplasia, is identified adjacent to the main tumor.   11/24/2017 Cancer Staging   Staging form: Exocrine Pancreas, AJCC 8th Edition - Pathologic stage from 11/24/2017: Stage IB (pT2, pN0, cM0) - Signed by Truitt Merle, MD on 01/03/2018   11/25/2017 Surgery   She had surgery at [facility] 1. Exploratory Laparotomy 2. Distal Pancreatectomy and Splenectomy 3. Intraoperative Ultrasound 4. Open Cholecystectomy    12/15/2017 Cancer Staging   Staging form: Exocrine Pancreas, AJCC 8th Edition - Clinical: Stage IB (cT2, cN0, cM0) - Signed by Heath Lark, MD on 12/15/2017   12/28/2017 Imaging   12/28/2017 CT Abdomen  IMPRESSION: 1. Postoperative findings from recent partial pancreatectomy including a 21 cubic cm fluid collection along the pancreatic resection margin which could represent early pseudocyst. 2. Nodularity along the lateral limb of the left adrenal gland could also be postoperative but merit surveillance, as the pancreatic lesion was in close  proximity to this adrenal gland on the prior CT of 11/07/2017. 3. Asymmetric fullness inferiorly in the left  breast. The patient has a history of prior left breast procedures, correlation with mammography is recommended. 4. Other imaging findings of potential clinical significance: Stable hemangioma in the left hepatic lobe. Splenectomy. Aortic Atherosclerosis (ICD10-I70.0). Right hemicolectomy. Small focus of fat necrosis in the right anterior abdominal wall subcutaneous tissues near the laparotomy site. Bilateral nonobstructive nephrolithiasis.   01/02/2018 Tumor Marker   Patient's tumor was tested for the following markers: CA-19-9 Results of the tumor marker test revealed 5   01/03/2018 - 06/05/2018 Chemotherapy   She received modified dose FOLFIRINOX   04/10/2018 Imaging   1. Distal pancreatectomy, without findings of recurrent or metastatic disease. 2. Incompletely imaged hypoenhancement within lower lobe right pulmonary artery branch is likely chronic (but interval since 10/09/2017) pulmonary embolism. Dedicated CTA could further evaluate. 3. Decrease in size of a peripancreatic complex fluid collection anteriorly, likely a resolving pseudocyst. 4. Decreased size of minimal fluid versus a borderline sized node in the gastrohepatic ligament. Recommend attention on follow-up. 5. Hepatic steatosis with a segment 4 hemangioma. 6. Aortic Atherosclerosis (ICD10-I70.0). This is significantly age advanced.   07/12/2018 Tumor Marker   Patient's tumor was tested for the following markers: CA-19-9 Results of the tumor marker test revealed 6   07/12/2018 Imaging   1. Status post distal pancreatectomy with stable postoperative fluid collection adjacent to the ventral aspect of the pancreatic head. No findings to suggest metastatic disease in the abdomen or pelvis. 2. Hepatic steatosis with small cavernous hemangioma in segment 4A of the liver. 3. Slight decreased size of nonenlarged gastrohepatic  ligament lymph node, presumably benign. 4. Aortic atherosclerosis.    10/16/2018 Imaging   Status post distal pancreatectomy. Stable postoperative seroma along the surgical margin.   Status post hysterectomy and right oophorectomy.   Status post right hemicolectomy with appendectomy.   No evidence of recurrent or metastatic disease.   No colonic wall thickening or mass is evident on CT.   01/22/2019 Tumor Marker   Patient's tumor was tested for the following markers: CA-19.9 Results of the tumor marker test revealed 5   01/22/2019 Imaging   1. No evidence of local recurrence or metastatic disease status post distal pancreatectomy and splenectomy. 2. Stable small seroma anterior to the pancreatic head. 3. Postsurgical changes as described. 4. Stable additional incidental findings including a hepatic hemangioma, nonobstructing bilateral renal calculi and aortic Atherosclerosis (ICD10-I70.0).   04/18/2019 Imaging   1. Status post distal pancreatectomy and splenectomy. There has been a gradual increase in ill-defined soft tissue and celiac axis/SMA origin lymph nodes adjacent to the distal pancreatectomy margin and lesser curvature of the stomach/gastric body over sequential prior examinations dated 01/22/2019 and 09/26/2018. An enlarged lymph node or soft tissue nodule adjacent to the SMA origin now measures 1.8 x 0.8 cm. Findings are concerning for local recurrence of pancreatic malignancy. 2. Separately from the above findings, there remains a 1.0 cm low-attenuation nodule anterior to the remnant pancreatic neck, most consistent with postoperative seroma 3. No evidence of distant metastatic disease in the chest, abdomen, or pelvis. 4. Postoperative findings of cholecystectomy, right hemicolectomy, and hysterectomy. 5. Bilateral nonobstructive nephrolithiasis. 6.  Aortic Atherosclerosis (ICD10-I70.0).       05/28/2019 Tumor Marker   Patient's tumor was tested for the following  markers: CA19-9 Results of the tumor marker test revealed 18   05/28/2019 Imaging   1. Status post distal pancreatectomy with continued further progression of the ill-defined soft tissue to the left of the celiac axis/SMA origin, now measuring 1.9 x  1.9 cm and highly concerning for recurrent/metastatic disease. This process abuts both the celiac axis, SMA, and posterior wall of the mid stomach. PET-CT may prove helpful to further evaluate. 2. Bandlike ill-defined soft tissue in the anterior abdomen, potentially peritoneal or omental is similar to perhaps minimally increased qualitatively in the interval. Close attention on follow-up recommended. 3. Left paraumbilical hernia contains a short segment of small bowel without complicating features. 4. Ill-defined very subtle area of decreased attenuation in the posterior aspect of hepatic segment IV. This is likely focal fatty deposition. Close attention on follow-up recommended. 5.  Aortic Atherosclerois (ICD10-170.0)   06/07/2019 Pathology Results   Malignant cells consistent with adenocarcinoma   06/07/2019 Procedure   ENDOSCOPIC FINDING (limited views with linear echoendoscope): :      The examined esophagus was endoscopically normal.      The entire examined stomach was endoscopically normal.      ENDOSONOGRAPHIC FINDING: :      1. An irregular mass was identified in the region of the pancreatic tail resection site. The mass was stellate with very poorly defined borders. Fine needle aspiration for cytology was performed. Color Doppler imaging was utilized prior to needle puncture to confirm a lack of significant vascular structures within the needle path. Four passes were made with the 25 gauge (FNB) needle using a transgastric approach. A cytotechnologist was present to evaluate the adequacy of the specimen. Final cytology results are pending. Impression:               -  Irregular, stellate soft tissue mass in the region of the pancreatic tail  resection site. This was sampled with four transgastric passes with an EUS FNB needle..   06/18/2019 - 08/14/2019 Chemotherapy   The patient had FOLFIRINOX for chemotherapy treatment.     09/03/2019 Imaging   1. Findings are again highly concerning for locally recurrent disease adjacent to the pancreatectomy bed, with enlarging soft tissue mass which is intimately associated with the superior mesenteric artery, celiac axis and superior aspect of the left renal  vein, as detailed above. 2. No evidence of metastatic disease in the thorax. 3. 2.7 x 1.2 cm cavernous hemangioma in segment 4A of the liver again noted. 4. Small nonobstructive calculi in the collecting systems of both kidneys measuring up to 3 mm in the lower pole collecting system of the left kidney. 5. Aortic atherosclerosis. 6. Additional incidental findings, as above.   11/16/2019 Imaging   Similar-appearing ill-defined soft tissue the left of the celiac axis/SMA, most compatible with recurrent/metastatic disease.   Similar ill-defined nodularity within the anterior upper abdomen, potentially peritoneal or omental. Continued attention on follow-up is recommended.     REVIEW OF SYSTEMS:   Constitutional: Denies fevers, chills or abnormal weight loss Eyes: Denies blurriness of vision Ears, nose, mouth, throat, and face: Denies mucositis or sore throat Respiratory: Denies cough, dyspnea or wheezes Cardiovascular: Denies palpitation, chest discomfort or lower extremity swelling Gastrointestinal:  Denies nausea, heartburn or change in bowel habits Skin: Denies abnormal skin rashes Lymphatics: Denies new lymphadenopathy or easy bruising Neurological:Denies numbness, tingling or new weaknesses Behavioral/Psych: Mood is stable, no new changes  All other systems were reviewed with the patient and are negative.  I have reviewed the past medical history, past surgical history, social history and family history with the patient and  they are unchanged from previous note.  ALLERGIES:  is allergic to codeine, penicillins, and bactrim [sulfamethoxazole-trimethoprim].  MEDICATIONS:  Current Outpatient Medications  Medication Sig  Dispense Refill  . cetirizine (ZYRTEC) 10 MG tablet Take 10 mg by mouth daily.    . citalopram (CELEXA) 20 MG tablet TAKE 1 TABLET BY MOUTH EVERY DAY 90 tablet 4  . CREON 24000-76000 units CPEP Take 1 capsule by mouth See admin instructions. Take 1 capsule before each meal and 1 capsule after each meal, take 1 cap with snacks    . diclofenac Sodium (VOLTAREN) 1 % GEL APPLY 2-4 GRAMS TO AFFECTED JOINT 4 TIMES DAILY AS NEEDED. 400 g 1  . estradiol (ESTRACE VAGINAL) 0.1 MG/GM vaginal cream Place 1 Applicatorful vaginally 3 (three) times a week. 42.5 g 12  . fluticasone (FLONASE) 50 MCG/ACT nasal spray Place 1 spray into both nostrils daily.    . folic acid (FOLVITE) 1 MG tablet TAKE 2 TABLETS BY MOUTH EVERY DAY 180 tablet 3  . gabapentin (NEURONTIN) 300 MG capsule Take 1 capsule (300 mg total) by mouth 3 (three) times daily. 90 capsule 11  . HYDROmorphone (DILAUDID) 4 MG tablet Take 1 tablet (4 mg total) by mouth every 4 (four) hours as needed. 60 tablet 0  . hydrOXYzine (ATARAX/VISTARIL) 10 MG tablet Take 1 tablet (10 mg total) by mouth 3 (three) times daily as needed. 30 tablet 1  . lidocaine-prilocaine (EMLA) cream Apply 1 application topically daily as needed (port). Apply to affected area once 30 g 11  . LORazepam (ATIVAN) 0.5 MG tablet Take 1 tablet (0.5 mg total) by mouth every 8 (eight) hours as needed for anxiety (nausea). 30 tablet 0  . metFORMIN (GLUCOPHAGE) 500 MG tablet TAKE 1 TABLET BY MOUTH TWICE A DAY WITH A MEAL 180 tablet 11  . methocarbamol (ROBAXIN) 500 MG tablet TAKE 1 TABLET BY MOUTH 3 TIMES DAILY AS NEEDED FOR MUSCLE SPASMS. 90 tablet 0  . Multiple Vitamin (MULTIVITAMIN WITH MINERALS) TABS tablet Take 1 tablet by mouth daily.    Marland Kitchen omeprazole (PRILOSEC) 40 MG capsule Take 1 capsule  (40 mg total) by mouth daily. 30 capsule 11  . ondansetron (ZOFRAN) 8 MG tablet TAKE 1 TAB BY MOUTH EVERY 8HOURS AS NEEDED FOR REFRACTORY NAUSEA/VOMITING START ON DAY 3AFTER CHEMO 8 tablet 7  . predniSONE (DELTASONE) 5 MG tablet Take 2 tablets (10 mg total) by mouth daily with breakfast. 60 tablet 0  . prochlorperazine (COMPAZINE) 10 MG tablet Take 1 tablet (10 mg total) by mouth every 6 (six) hours as needed. 90 tablet 3  . rivaroxaban (XARELTO) 20 MG TABS tablet Take 1 tablet (20 mg total) by mouth daily with supper. 30 tablet 9  . simethicone (MYLICON) 892 MG chewable tablet Chew 125 mg by mouth every 6 (six) hours as needed for flatulence.     No current facility-administered medications for this visit.    PHYSICAL EXAMINATION: ECOG PERFORMANCE STATUS: 1 - Symptomatic but completely ambulatory  Vitals:   12/28/19 1138  BP: 138/77  Pulse: (!) 58  Resp: 18  Temp: 98.3 F (36.8 C)  SpO2: 100%   Filed Weights   12/28/19 1138  Weight: 197 lb (89.4 kg)    GENERAL:alert, no distress and comfortable. She looks mildly cushingoid SKIN: skin color, texture, turgor are normal, no rashes or significant lesions EYES: normal, Conjunctiva are pink and non-injected, sclera clear OROPHARYNX:no exudate, no erythema and lips, buccal mucosa, and tongue normal  NECK: supple, thyroid normal size, non-tender, without nodularity LYMPH:  no palpable lymphadenopathy in the cervical, axillary or inguinal LUNGS: clear to auscultation and percussion with normal breathing effort HEART: regular rate &  rhythm and no murmurs and no lower extremity edema ABDOMEN:abdomen soft, non-tender and normal bowel sounds Musculoskeletal:no cyanosis of digits and no clubbing  NEURO: alert & oriented x 3 with fluent speech, no focal motor/sensory deficits  LABORATORY DATA:  I have reviewed the data as listed    Component Value Date/Time   NA 136 11/09/2019 1012   K 4.4 11/09/2019 1012   CL 101 11/09/2019 1012    CO2 27 11/09/2019 1012   GLUCOSE 101 (H) 11/09/2019 1012   BUN 13 11/09/2019 1012   CREATININE 0.67 11/09/2019 1012   CREATININE 0.65 09/27/2019 1535   CREATININE 0.68 07/27/2017 0851   CALCIUM 9.4 11/09/2019 1012   PROT 7.2 11/09/2019 1012   ALBUMIN 3.5 11/09/2019 1012   AST 19 11/09/2019 1012   AST 62 (H) 09/27/2019 1535   ALT 30 11/09/2019 1012   ALT 60 (H) 09/27/2019 1535   ALKPHOS 62 11/09/2019 1012   BILITOT <0.2 (L) 11/09/2019 1012   BILITOT <0.2 (L) 09/27/2019 1535   GFRNONAA >60 11/09/2019 1012   GFRNONAA >60 09/27/2019 1535   GFRNONAA 106 07/27/2017 0851   GFRAA >60 11/09/2019 1012   GFRAA >60 09/27/2019 1535   GFRAA 123 07/27/2017 0851    No results found for: SPEP, UPEP  Lab Results  Component Value Date   WBC 14.0 (H) 12/28/2019   NEUTROABS 11.5 (H) 12/28/2019   HGB 11.8 (L) 12/28/2019   HCT 36.8 12/28/2019   MCV 87.6 12/28/2019   PLT 510 (H) 12/28/2019      Chemistry      Component Value Date/Time   NA 136 11/09/2019 1012   K 4.4 11/09/2019 1012   CL 101 11/09/2019 1012   CO2 27 11/09/2019 1012   BUN 13 11/09/2019 1012   CREATININE 0.67 11/09/2019 1012   CREATININE 0.65 09/27/2019 1535   CREATININE 0.68 07/27/2017 0851      Component Value Date/Time   CALCIUM 9.4 11/09/2019 1012   ALKPHOS 62 11/09/2019 1012   AST 19 11/09/2019 1012   AST 62 (H) 09/27/2019 1535   ALT 30 11/09/2019 1012   ALT 60 (H) 09/27/2019 1535   BILITOT <0.2 (L) 11/09/2019 1012   BILITOT <0.2 (L) 09/27/2019 1535

## 2019-12-28 NOTE — Assessment & Plan Note (Signed)
We will follow with CT imaging She has completed adjuvant therapy

## 2019-12-28 NOTE — Assessment & Plan Note (Signed)
Her arthritis pain is well managed with low-dose prednisone She will continue to treatment under the direction of her rheumatologist

## 2019-12-28 NOTE — Assessment & Plan Note (Signed)
She will continue Metformin

## 2019-12-29 ENCOUNTER — Encounter: Payer: Self-pay | Admitting: Hematology and Oncology

## 2019-12-31 ENCOUNTER — Other Ambulatory Visit: Payer: Self-pay

## 2019-12-31 ENCOUNTER — Inpatient Hospital Stay: Payer: BC Managed Care – PPO

## 2019-12-31 ENCOUNTER — Telehealth: Payer: Self-pay

## 2019-12-31 DIAGNOSIS — C541 Malignant neoplasm of endometrium: Secondary | ICD-10-CM | POA: Diagnosis not present

## 2019-12-31 DIAGNOSIS — G893 Neoplasm related pain (acute) (chronic): Secondary | ICD-10-CM | POA: Diagnosis not present

## 2019-12-31 DIAGNOSIS — Z7901 Long term (current) use of anticoagulants: Secondary | ICD-10-CM | POA: Diagnosis not present

## 2019-12-31 DIAGNOSIS — C252 Malignant neoplasm of tail of pancreas: Secondary | ICD-10-CM | POA: Diagnosis not present

## 2019-12-31 DIAGNOSIS — I2699 Other pulmonary embolism without acute cor pulmonale: Secondary | ICD-10-CM | POA: Diagnosis not present

## 2019-12-31 DIAGNOSIS — M069 Rheumatoid arthritis, unspecified: Secondary | ICD-10-CM | POA: Diagnosis not present

## 2019-12-31 DIAGNOSIS — Z5189 Encounter for other specified aftercare: Secondary | ICD-10-CM | POA: Diagnosis not present

## 2019-12-31 DIAGNOSIS — Z7984 Long term (current) use of oral hypoglycemic drugs: Secondary | ICD-10-CM | POA: Diagnosis not present

## 2019-12-31 DIAGNOSIS — C7961 Secondary malignant neoplasm of right ovary: Secondary | ICD-10-CM

## 2019-12-31 DIAGNOSIS — Z5111 Encounter for antineoplastic chemotherapy: Secondary | ICD-10-CM | POA: Diagnosis not present

## 2019-12-31 DIAGNOSIS — E098 Drug or chemical induced diabetes mellitus with unspecified complications: Secondary | ICD-10-CM | POA: Diagnosis not present

## 2019-12-31 NOTE — Telephone Encounter (Signed)
-----   Message from Heath Lark, MD sent at 12/31/2019 12:47 PM EST ----- Regarding: lab appt for CA-19-9 only PLs call and schedule labs for CA-19-9 only We will call her with test results

## 2019-12-31 NOTE — Telephone Encounter (Signed)
Called and scheduled lab appt at 3 pm today. She is aware of appt time.

## 2020-01-01 ENCOUNTER — Telehealth: Payer: Self-pay

## 2020-01-01 ENCOUNTER — Encounter: Payer: Self-pay | Admitting: Hematology and Oncology

## 2020-01-01 LAB — CANCER ANTIGEN 19-9: CA 19-9: 298 U/mL — ABNORMAL HIGH (ref 0–35)

## 2020-01-01 NOTE — Telephone Encounter (Signed)
-----   Message from Heath Lark, MD sent at 01/01/2020  9:53 AM EST ----- Regarding: CA-19-9 Her result came back high If she wants she can change CT to be done sooner next week Depending when we have it scheduled I can see her the day after

## 2020-01-01 NOTE — Telephone Encounter (Signed)
Called and given below message. She verbalized understanding. She will call and move scan to next week and then call the office back.

## 2020-01-02 ENCOUNTER — Encounter: Payer: Self-pay | Admitting: Rheumatology

## 2020-01-02 DIAGNOSIS — Z780 Asymptomatic menopausal state: Secondary | ICD-10-CM | POA: Diagnosis not present

## 2020-01-03 ENCOUNTER — Other Ambulatory Visit: Payer: Self-pay

## 2020-01-03 ENCOUNTER — Ambulatory Visit
Admission: RE | Admit: 2020-01-03 | Discharge: 2020-01-03 | Disposition: A | Discharge status: Home / Self Care | Payer: BC Managed Care – PPO | Source: Ambulatory Visit | Attending: Obstetrics and Gynecology | Admitting: Obstetrics and Gynecology

## 2020-01-03 DIAGNOSIS — Z1231 Encounter for screening mammogram for malignant neoplasm of breast: Secondary | ICD-10-CM

## 2020-01-04 ENCOUNTER — Telehealth: Payer: Self-pay | Admitting: *Deleted

## 2020-01-04 NOTE — Telephone Encounter (Addendum)
Received DEXA results from [facility].  Date of Scan: 01/02/2020 Lowest T-score and site measured: 0.3 AP Spine Significant changes in BMD and site measured (5% and above): n/a  Current Regimen:n/a  Recommendation: Repeat DEXA in 5 years.   Patient advised of results and recommendations.

## 2020-01-06 ENCOUNTER — Other Ambulatory Visit: Payer: Self-pay | Admitting: Rheumatology

## 2020-01-07 NOTE — Telephone Encounter (Signed)
Last Visit: 11/01/2019 Next Visit: 02/07/2020  Current Dose per office note on 11/01/2019: prednisone 10 mg p.o. daily   Okay to refill per Dr. Estanislado Pandy

## 2020-01-10 ENCOUNTER — Inpatient Hospital Stay (HOSPITAL_BASED_OUTPATIENT_CLINIC_OR_DEPARTMENT_OTHER): Payer: BC Managed Care – PPO | Admitting: Hematology and Oncology

## 2020-01-10 ENCOUNTER — Other Ambulatory Visit: Payer: Self-pay

## 2020-01-10 ENCOUNTER — Other Ambulatory Visit: Payer: Self-pay | Admitting: Hematology and Oncology

## 2020-01-10 ENCOUNTER — Encounter: Payer: Self-pay | Admitting: Hematology and Oncology

## 2020-01-10 ENCOUNTER — Encounter (HOSPITAL_COMMUNITY): Payer: Self-pay

## 2020-01-10 ENCOUNTER — Ambulatory Visit (HOSPITAL_COMMUNITY)
Admission: RE | Admit: 2020-01-10 | Discharge: 2020-01-10 | Disposition: A | Discharge status: Home / Self Care | Payer: BC Managed Care – PPO | Source: Ambulatory Visit | Attending: Hematology and Oncology | Admitting: Hematology and Oncology

## 2020-01-10 VITALS — BP 110/88 | HR 77 | Temp 99.2°F | Resp 18 | Ht 62.0 in | Wt 209.0 lb

## 2020-01-10 DIAGNOSIS — Z7901 Long term (current) use of anticoagulants: Secondary | ICD-10-CM | POA: Diagnosis not present

## 2020-01-10 DIAGNOSIS — C541 Malignant neoplasm of endometrium: Secondary | ICD-10-CM

## 2020-01-10 DIAGNOSIS — Z8543 Personal history of malignant neoplasm of ovary: Secondary | ICD-10-CM | POA: Diagnosis not present

## 2020-01-10 DIAGNOSIS — Z5189 Encounter for other specified aftercare: Secondary | ICD-10-CM | POA: Diagnosis not present

## 2020-01-10 DIAGNOSIS — C252 Malignant neoplasm of tail of pancreas: Secondary | ICD-10-CM | POA: Diagnosis not present

## 2020-01-10 DIAGNOSIS — I2699 Other pulmonary embolism without acute cor pulmonale: Secondary | ICD-10-CM | POA: Diagnosis not present

## 2020-01-10 DIAGNOSIS — M47814 Spondylosis without myelopathy or radiculopathy, thoracic region: Secondary | ICD-10-CM | POA: Diagnosis not present

## 2020-01-10 DIAGNOSIS — Z7189 Other specified counseling: Secondary | ICD-10-CM

## 2020-01-10 DIAGNOSIS — C7961 Secondary malignant neoplasm of right ovary: Secondary | ICD-10-CM | POA: Diagnosis not present

## 2020-01-10 DIAGNOSIS — E098 Drug or chemical induced diabetes mellitus with unspecified complications: Secondary | ICD-10-CM | POA: Diagnosis not present

## 2020-01-10 DIAGNOSIS — G893 Neoplasm related pain (acute) (chronic): Secondary | ICD-10-CM

## 2020-01-10 DIAGNOSIS — D1809 Hemangioma of other sites: Secondary | ICD-10-CM | POA: Diagnosis not present

## 2020-01-10 DIAGNOSIS — M069 Rheumatoid arthritis, unspecified: Secondary | ICD-10-CM | POA: Diagnosis not present

## 2020-01-10 DIAGNOSIS — K429 Umbilical hernia without obstruction or gangrene: Secondary | ICD-10-CM | POA: Diagnosis not present

## 2020-01-10 DIAGNOSIS — Z5111 Encounter for antineoplastic chemotherapy: Secondary | ICD-10-CM | POA: Diagnosis not present

## 2020-01-10 DIAGNOSIS — Z7984 Long term (current) use of oral hypoglycemic drugs: Secondary | ICD-10-CM | POA: Diagnosis not present

## 2020-01-10 DIAGNOSIS — N200 Calculus of kidney: Secondary | ICD-10-CM | POA: Diagnosis not present

## 2020-01-10 MED ORDER — IOHEXOL 300 MG/ML  SOLN
100.0000 mL | Freq: Once | INTRAMUSCULAR | Status: AC | PRN
  Administered 2020-01-10: 100 mL via INTRAVENOUS

## 2020-01-10 NOTE — Progress Notes (Signed)
[place] OFFICE PROGRESS NOTE  Patient Care Team: [name], MD as PCP - General (Family Medicine)  ASSESSMENT & PLAN:  Malignant neoplasm of tail of pancreas [facility]) I reviewed multiple imaging studies with the patient Unfortunately, CT imaging from today show significant new findings with for new lesions in her liver Her tumor marker is markedly elevated Thankfully, she is not yet symptomatic We reviewed the guidelines and discussed options She is in agreement to proceed with combination of gemcitabine with Abraxane We discussed the risk, benefits, side effects of treatment including risk of pancytopenia, infection, hair loss and neuropathy and she is in agreement to proceed I will see her on day 8 of treatment In my experience, it is likely she will need dose adjustment in the future We could also consider changing her cycle 2-day 1 and 8, rest day 15 for cycle every 21 days but for now, she schedule weekly for 3 weeks and 1 week off, pending future evaluation Due to history of severe nausea, I will substitute Compazine with Zofran  Goals of care, counseling/discussion We have extensive discussions in the past about goals of care She is aware that treatment goal is palliative in nature  Cancer associated pain Her pain is well controlled She will continue pain medicine as prescribed   Orders Placed This Encounter  Procedures  . CBC with Differential (Cancer Center Only)    Standing Status:   Standing    Number of Occurrences:   20    Standing Expiration Date:   01/09/2021  . CMP ([place] only)    Standing Status:   Standing    Number of Occurrences:   20    Standing Expiration Date:   01/09/2021    All questions were answered. The patient knows to call the clinic with any problems, questions or concerns. The total time spent in the appointment was 40 minutes encounter with patients including review of chart and various tests results, discussions  about plan of care and coordination of care plan   Heath Lark, MD 01/10/2020 3:17 PM  INTERVAL HISTORY: Please see below for problem oriented charting. 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 Mixed attenuation mass emanates from the right adnexa measuring 10.8 x 8.0 cm very suspicious for right ovarian carcinoma. 2. Abnormality of the tail of the pancreas may be due to mild pancreatitis and small pseudocyst formation, but neoplasm cannot be excluded. 3. Small amount of ascites within abdomen and pelvis. 4. Small nonobstructing renal calculi bilaterally.    08/30/2017 Pathology Results   1. Uterus and cervix, with left fallopian tube - ENDOMETRIOID ADENOCARCINOMA, FIGO GRADE I, ARISING IN A BACKGROUND OF DIFFUSE COMPLEX ATYPICAL HYPERPLASIA. - CARCINOMA INVADES FOR OF DEPTH OF 0.2 CM WHERE THICKNESS OF MYOMETRIAL WALL IS 2.1 CM. - ALL RESECTION MARGINS ARE NEGATIVE FOR CARCINOMA. - NEGATIVE FOR LYMPHOVASCULAR OR PERINEURAL INVASION. - CERVICAL STROMA IS NOT INVOLVED. - SEE ONCOLOGY TABLE. - SEE NOTE 2. Ovary and fallopian tube, right - PRIMARY OVARIAN ENDOMETRIOID ADENOCARCINOMA, FIGO GRADE II, 12 CM. - THE OVARIAN SURFACE IS FOCALLY  INVOLVED BY CARCINOMA. - NEGATIVE FOR LYMPHOVASCULAR INVASION. - BENIGN UNREMARKABLE FALLOPIAN TUBE, NEGATIVE FOR CARCINOMA. - SEE ONCOLOGY TABLE. - SEE NOTE 3. Cul-de-sac biopsy - METASTATIC ADENOCARCINOMA, MOST CONSISTENT WITH PRIMARY OVARIAN ENDOMETRIOID ADENOCARCINOMA. 4. Ovary, left - BENIGN UNREMARKABLE OVARY, NEGATIVE  FOR MALIGNANCY. Microscopic Comment 1. UTERUS, CARCINOMA OR CARCINOSARCOMA Procedure: Total hysterectomy with bilateral salpingo-oophorectomy. Histologic type: Endometrioid adenocarcinoma. Histologic Grade: FIGO Grade I Myometrial invasion: Depth of invasion: 2 mm Myometrial thickness: 21 mm Uterine Serosa Involvement: Not identified Cervical stromal involvement: Not identified Extent of involvement of other organs: Not applicable Lymphovascular invasion: Not identified Regional Lymph Nodes: Examined: 0 Sentinel 0 Non-sentinel 0 Total Tumor block for ancillary studies: 1H MMR / MSI testing: Pending Pathologic Stage Classification (pTNM, AJCC 8th edition): pT1a, pNX (v4.1.0.0) 2. OVARY or FALLOPIAN TUBE or PRIMARY PERITONEUM: Procedure: Salpingo-oophorectomy Specimen Integrity: Intact Tumor Site: Right ovary Ovarian Surface Involvement (required only if applicable): Focally involved by carcinoma Fallopian Tube Surface Involvement (required only if applicable): Not identified Tumor Size: 12 cm Histologic Type: Endometrioid adenocarcinoma Histologic Grade: Grade II Implants (required for advanced stage serous/seromucinous borderline tumors only): Not applicable Other Tissue/ Organ Involvement: Cul de sac biopsy involved by tumor Largest Extrapelvic Peritoneal Focus (required only if applicable): Not applicable Peritoneal/Ascitic Fluid: Negative for carcinoma (case # [id number]) Treatment Effect (required only for high-grade serous carcinomas): Not applicable Regional Lymph Nodes: No lymph nodes submitted or found Number of Lymph Nodes Examined: 0 Pathologic Stage Classification (pTNM, AJCC 8th Edition): pT2b, pN0 Representative Tumor Block: 2B and 2E 1. Molecular study for microsatellite instability and immunohistochemical stains for MMR-related proteins are pending and will be reported in an addendum. 2. Immunohistochemical stain show that the ovarian tumor is positive for CK7  and PAX8 (both diffuse), CDX2 (patchy and weak); and negative for CK20. This immunoprofile is consistent with the above diagnosis. Dr. Lyndon Code has reviewed this case and concurs with the above diagnosis. Molecular study for microsatellite instability and immunohistochemical stains for MMR-related proteins are pending and will be reported in an addendum   08/30/2017 Genetic Testing   Patient has genetic testing done for MSI  Results revealed patient has the following mutation(s): loss of [facility] 2   08/30/2017 Surgery   Surgeon: Mart Piggs, MD Pre-operative Diagnosis:  1. Adnexal mass 2. Abnormal uterine bleeding 3. H/o Cecal CA  Post-operative Diagnosis:  1. Adhesive disease post colon resection 2. Endometrial cancer NOS 3. Adenocarcinoma unknown origin, right ovary, suspicious for GI primary  Operation:  1. Lysis of adhesions ~30 minutes 2. Robotic-assisted laparoscopic total hysterectomy with right salpingo-oophorectomy and left salpingectomy 3. Left oophorectomy (RA-laparoscopic) 4. Pelvic washings  Findings: Adhesions of omentum to anterior abdominal wall. Enlarged cystic right ovary ~10cm. Uterus had small nodules on serosa near where right adnexa was intimate with the surface. No obvious intraoperative rupture of cyst, although in 2 areas the wall was thin and one of these areas had some bleeding. Slight scarring of left bladder dome to LUS/cervix. Uterus on frozen section c/w hyperplasia and a small focus of endometrial CA - no myo invasion, <2cm in size. Frozen section on the right adnexa was carcinoma, met from colon or possibly Gyn, favor GI primary, defer to permanent. Left ovary was WNL.     09/15/2017 Cancer Staging   Staging form: Corpus Uteri - Carcinoma and Carcinosarcoma, AJCC 8th Edition - Pathologic: FIGO Stage IA (pT1a, pN0, cM0) - Signed by Heath Lark, MD on 09/15/2017   09/26/2017 Procedure   Successful placement of  a right internal jugular approach power  injectable Port-A-Cath. The catheter is ready for immediate use.   11/07/2017 Imaging   1. Since 08/18/2017, similar to slight decrease in size of a pancreatic body/tail junction lesion. Cross modality comparison relative to 09/16/2017 MRI is also grossly similar. 2. No evidence of metastatic disease. 3. Aortic Atherosclerosis (ICD10-I70.0).  4. Left nephrolithiasis.   11/18/2017 Genetic Testing   MSH2 c.2237dupT pathogenic mutation identified in the CancerNext panel.  The CancerNext gene panel offered by [employer] includes sequencing and rearrangement analysis for the following 34 genes:   APC, ATM, BARD1, BMPR1A, BRCA1, BRCA2, BRIP1, CDH1, CDK4, CDKN2A, CHEK2, DICER1, HOXB13, EPCAM, GREM1, MLH1, MRE11A, MSH2, MSH6, MUTYH, NBN, NF1, PALB2, PMS2, POLD1, POLE, PTEN, RAD50, RAD51C, RAD51D, SMAD4, SMARCA4, STK11, and TP53.  The report date is November 18, 2017.  MSH2 [id number] pathogenic mutation identified on somatic testing.  These results are consistent with a diagnosis of Lynch syndrome.   06/18/2019 - 08/14/2019 Chemotherapy   The patient had FOLFIRINOX for chemotherapy treatment.     01/18/2020 -  Chemotherapy   The patient had PACLitaxel-protein bound (ABRAXANE) chemo infusion 250 mg, 125 mg/m2 = 250 mg, Intravenous,  Once, 0 of 4 cycles ondansetron (ZOFRAN) injection 8 mg, 8 mg (100 % of original dose 8 mg), Intravenous,  Once, 0 of 4 cycles Dose modification: 8 mg (original dose 8 mg, Cycle 1) gemcitabine (GEMZAR) 2,052 mg in sodium chloride 0.9 % 250 mL chemo infusion, 1,000 mg/m2 = 2,052 mg, Intravenous,  Once, 0 of 4 cycles  for chemotherapy treatment.    Secondary malignant neoplasm of right ovary ([place])  08/23/2017 Tumor Marker   Patient's tumor was tested for the following markers: CA-125 Results of the tumor marker test revealed 139.8   08/31/2017 Initial Diagnosis   Secondary malignant neoplasm of right ovary ([place])   09/15/2017 Cancer Staging   Staging form:  Ovary, Fallopian Tube, and Primary Peritoneal Carcinoma, AJCC 8th Edition - Pathologic: Stage IIB (pT2b, pN0, cM0) - Signed by Heath Lark, MD on 09/15/2017   09/27/2017 Imaging   No evidence of metastatic disease or other acute findings within the thorax.  4 cm low-attenuation mass in pancreatic tail, highly suspicious for pancreatic carcinoma. This is caused splenic vein thrombosis, with new venous collaterals in the left upper quadrant. Consider endoscopic ultrasound with FNA for tissue diagnosis.  Stable benign hepatic hemangioma.    09/27/2017 Tumor Marker   Patient's tumor was tested for the following markers: CA-125 Results of the tumor marker test revealed 39.4   11/02/2017 Tumor Marker   Patient's tumor was tested for the following markers: CA-125 Results of the tumor marker test revealed 19   11/07/2017 Tumor Marker   Patient's tumor was tested for the following markers: CA-125 Results of the tumor marker test revealed 18.3   11/18/2017 Genetic Testing   MSH2 c.2237dupT pathogenic mutation identified in the CancerNext panel.  The CancerNext gene panel offered by [employer] includes sequencing and rearrangement analysis for the following 34 genes:   APC, ATM, BARD1, BMPR1A, BRCA1, BRCA2, BRIP1, CDH1, CDK4, CDKN2A, CHEK2, DICER1, HOXB13, EPCAM, GREM1, MLH1, MRE11A, MSH2, MSH6, MUTYH, NBN, NF1, PALB2, PMS2, POLD1, POLE, PTEN, RAD50, RAD51C, RAD51D, SMAD4, SMARCA4, STK11, and TP53.  The report date is November 18, 2017.  MSH2 [id number] pathogenic mutation identified on somatic testing.  These results are consistent with a diagnosis of Lynch syndrome.   12/15/2017 Tumor Marker   Patient's tumor was tested for the following markers: CA-125 Results  of the tumor marker test revealed 57.3   01/16/2018 Tumor Marker   Patient's tumor was tested for the following markers: CA-125 Results of the tumor marker test revealed 24.2   04/10/2018 Tumor Marker   Patient's tumor  was tested for the following markers: CA-125 Results of the tumor marker test revealed 18.2   07/12/2018 Tumor Marker   Patient's tumor was tested for the following markers: CA-125 Results of the tumor marker test revealed 11.8   10/16/2018 Tumor Marker   Patient's tumor was tested for the following markers: CA125 Results of the tumor marker test revealed 12.4.   Malignant neoplasm of tail of pancreas ([place])  10/13/2017 Pathology Results   Pancreas tail mass, endoscopic ultrasound-guided, fine needle aspiration II (smears and cell block): Adenocarcinoma   11/18/2017 Genetic Testing   MSH2 c.2237dupT pathogenic mutation identified in the CancerNext panel.  The CancerNext gene panel offered by [employer] includes sequencing and rearrangement analysis for the following 34 genes:   APC, ATM, BARD1, BMPR1A, BRCA1, BRCA2, BRIP1, CDH1, CDK4, CDKN2A, CHEK2, DICER1, HOXB13, EPCAM, GREM1, MLH1, MRE11A, MSH2, MSH6, MUTYH, NBN, NF1, PALB2, PMS2, POLD1, POLE, PTEN, RAD50, RAD51C, RAD51D, SMAD4, SMARCA4, STK11, and TP53.  The report date is November 18, 2017.  MSH2 [id number] pathogenic mutation identified on somatic testing.  These results are consistent with a diagnosis of Lynch syndrome.   11/24/2017 Pathology Results   A. "TAIL OF PANCREAS AND SPLEEN", DISTAL PANCREATECTOMY AND SPLENECTOMY: Invasive ductal adenocarcinoma, moderately to poorly differentiated with focal signet ring cell features, of pancreas (distal).   The carcinoma is 2.5 cm in greatest dimension grossly.   Treatment effects present in the form of fibrosis (50%). No lymphovascular or definite perineural invasion identified. All surgical margins are negative for tumor or high-grade dysplasia. Adjacent mucinous neoplasm, most consistent with Intraductal papillary mucinous neoplasm (IPMN) with low-grade dysplasia.   Uninvolved pancreas show atrophy and focal acute  inflammation.   Twelve benign lymph nodes (0/12). Spleen with no significant histopathologic abnormalities.  PROCEDURE: distal pancreatectomy and splenectomy TUMOR SITE: distal pancreas TUMOR SIZE:  GREATEST DIMENSION: 2.5 cm  ADDITIONAL DIMENSIONS:  x  cm HISTOLOGIC TYPE: ductal adenocarcinoma HISTOLOGIC GRADE: grade 3 TUMOR EXTENSION: peripancreatic soft tissue MARGINS: negative for tumor TREATMENT EFFECT: treatment effects present in the form of fibrosis (50%). LYMPHOVASCULAR INVASION: not identified PERINEURAL INVASION: no definite evidence REGIONAL LYMPH NODES:   NUMBER OF LYMPH NODES INVOLVED: 0   NUMBER OF LYMPH NODES EXAMINED: 12 PATHOLOGIC STAGE CLASSIFICATION (pTNM, AJCC 8th Ed): ypT2, ypN0 DISTANT METASTASIS (pM): pMx ADDITIONAL PATHOLOGIC FINDINGS: mucinous neoplasm, most consistent with intraductal papillary mucinous neoplasm (IPMN) with low-grade dysplasia, is identified adjacent to the main tumor.   11/24/2017 Cancer Staging   Staging form: Exocrine Pancreas, AJCC 8th Edition - Pathologic stage from 11/24/2017: Stage IB (pT2, pN0, cM0) - Signed by Truitt Merle, MD on 01/03/2018   11/25/2017 Surgery   She had surgery at [facility] 1. Exploratory Laparotomy 2. Distal Pancreatectomy and Splenectomy 3. Intraoperative Ultrasound 4. Open Cholecystectomy    12/15/2017 Cancer Staging   Staging form: Exocrine Pancreas, AJCC 8th Edition - Clinical: Stage IB (cT2, cN0, cM0) - Signed by Heath Lark, MD on 12/15/2017   12/28/2017 Imaging   12/28/2017 CT Abdomen  IMPRESSION: 1. Postoperative findings from recent partial pancreatectomy including a 21 cubic cm fluid collection along the pancreatic resection margin which could represent early pseudocyst. 2. Nodularity along the lateral limb of the left adrenal gland could also be postoperative but merit surveillance,  as the pancreatic lesion was in close proximity to this adrenal gland on the prior CT of  11/07/2017. 3. Asymmetric fullness inferiorly in the left breast. The patient has a history of prior left breast procedures, correlation with mammography is recommended. 4. Other imaging findings of potential clinical significance: Stable hemangioma in the left hepatic lobe. Splenectomy. Aortic Atherosclerosis (ICD10-I70.0). Right hemicolectomy. Small focus of fat necrosis in the right anterior abdominal wall subcutaneous tissues near the laparotomy site. Bilateral nonobstructive nephrolithiasis.   01/02/2018 Tumor Marker   Patient's tumor was tested for the following markers: CA-19-9 Results of the tumor marker test revealed 5   01/03/2018 - 06/05/2018 Chemotherapy   She received modified dose FOLFIRINOX   04/10/2018 Imaging   1. Distal pancreatectomy, without findings of recurrent or metastatic disease. 2. Incompletely imaged hypoenhancement within lower lobe right pulmonary artery branch is likely chronic (but interval since 10/09/2017) pulmonary embolism. Dedicated CTA could further evaluate. 3. Decrease in size of a peripancreatic complex fluid collection anteriorly, likely a resolving pseudocyst. 4. Decreased size of minimal fluid versus a borderline sized node in the gastrohepatic ligament. Recommend attention on follow-up. 5. Hepatic steatosis with a segment 4 hemangioma. 6. Aortic Atherosclerosis (ICD10-I70.0). This is significantly age advanced.   07/12/2018 Tumor Marker   Patient's tumor was tested for the following markers: CA-19-9 Results of the tumor marker test revealed 6   07/12/2018 Imaging   1. Status post distal pancreatectomy with stable postoperative fluid collection adjacent to the ventral aspect of the pancreatic head. No findings to suggest metastatic disease in the abdomen or pelvis. 2. Hepatic steatosis with small cavernous hemangioma in segment 4A of the liver. 3. Slight decreased size of nonenlarged gastrohepatic ligament lymph node, presumably benign. 4. Aortic  atherosclerosis.    10/16/2018 Imaging   Status post distal pancreatectomy. Stable postoperative seroma along the surgical margin.   Status post hysterectomy and right oophorectomy.   Status post right hemicolectomy with appendectomy.   No evidence of recurrent or metastatic disease.   No colonic wall thickening or mass is evident on CT.   01/22/2019 Tumor Marker   Patient's tumor was tested for the following markers: CA-19.9 Results of the tumor marker test revealed 5   01/22/2019 Imaging   1. No evidence of local recurrence or metastatic disease status post distal pancreatectomy and splenectomy. 2. Stable small seroma anterior to the pancreatic head. 3. Postsurgical changes as described. 4. Stable additional incidental findings including a hepatic hemangioma, nonobstructing bilateral renal calculi and aortic Atherosclerosis (ICD10-I70.0).   04/18/2019 Imaging   1. Status post distal pancreatectomy and splenectomy. There has been a gradual increase in ill-defined soft tissue and celiac axis/SMA origin lymph nodes adjacent to the distal pancreatectomy margin and lesser curvature of the stomach/gastric body over sequential prior examinations dated 01/22/2019 and 09/26/2018. An enlarged lymph node or soft tissue nodule adjacent to the SMA origin now measures 1.8 x 0.8 cm. Findings are concerning for local recurrence of pancreatic malignancy. 2. Separately from the above findings, there remains a 1.0 cm low-attenuation nodule anterior to the remnant pancreatic neck, most consistent with postoperative seroma 3. No evidence of distant metastatic disease in the chest, abdomen, or pelvis. 4. Postoperative findings of cholecystectomy, right hemicolectomy, and hysterectomy. 5. Bilateral nonobstructive nephrolithiasis. 6.  Aortic Atherosclerosis (ICD10-I70.0).       05/28/2019 Tumor Marker   Patient's tumor was tested for the following markers: CA19-9 Results of the tumor marker test revealed  18   05/28/2019 Imaging  1. Status post distal pancreatectomy with continued further progression of the ill-defined soft tissue to the left of the celiac axis/SMA origin, now measuring 1.9 x 1.9 cm and highly concerning for recurrent/metastatic disease. This process abuts both the celiac axis, SMA, and posterior wall of the mid stomach. PET-CT may prove helpful to further evaluate. 2. Bandlike ill-defined soft tissue in the anterior abdomen, potentially peritoneal or omental is similar to perhaps minimally increased qualitatively in the interval. Close attention on follow-up recommended. 3. Left paraumbilical hernia contains a short segment of small bowel without complicating features. 4. Ill-defined very subtle area of decreased attenuation in the posterior aspect of hepatic segment IV. This is likely focal fatty deposition. Close attention on follow-up recommended. 5.  Aortic Atherosclerois (ICD10-170.0)   06/07/2019 Pathology Results   Malignant cells consistent with adenocarcinoma   06/07/2019 Procedure   ENDOSCOPIC FINDING (limited views with linear echoendoscope): :      The examined esophagus was endoscopically normal.      The entire examined stomach was endoscopically normal.      ENDOSONOGRAPHIC FINDING: :      1. An irregular mass was identified in the region of the pancreatic tail resection site. The mass was stellate with very poorly defined borders. Fine needle aspiration for cytology was performed. Color Doppler imaging was utilized prior to needle puncture to confirm a lack of significant vascular structures within the needle path. Four passes were made with the 25 gauge (FNB) needle using a transgastric approach. A cytotechnologist was present to evaluate the adequacy of the specimen. Final cytology results are pending. Impression:               -  Irregular, stellate soft tissue mass in the region of the pancreatic tail resection site. This was sampled with four transgastric passes  with an EUS FNB needle..   06/18/2019 - 08/14/2019 Chemotherapy   The patient had FOLFIRINOX for chemotherapy treatment.     09/03/2019 Imaging   1. Findings are again highly concerning for locally recurrent disease adjacent to the pancreatectomy bed, with enlarging soft tissue mass which is intimately associated with the superior mesenteric artery, celiac axis and superior aspect of the left renal  vein, as detailed above. 2. No evidence of metastatic disease in the thorax. 3. 2.7 x 1.2 cm cavernous hemangioma in segment 4A of the liver again noted. 4. Small nonobstructive calculi in the collecting systems of both kidneys measuring up to 3 mm in the lower pole collecting system of the left kidney. 5. Aortic atherosclerosis. 6. Additional incidental findings, as above.   11/16/2019 Imaging   Similar-appearing ill-defined soft tissue the left of the celiac axis/SMA, most compatible with recurrent/metastatic disease.   Similar ill-defined nodularity within the anterior upper abdomen, potentially peritoneal or omental. Continued attention on follow-up is recommended.   12/31/2019 Tumor Marker   Patient's tumor was tested for the following markers: CA-19-9 Results of the tumor marker test revealed 298   01/10/2020 Imaging   1. Numerous small hypoenhancing liver masses scattered throughout the liver, all new since 11/16/2019 CT, compatible with new liver metastases. 2. Stable upper retroperitoneal mass encasing the celiac axis and abutting the pancreatic resection margin, compatible with stable locally recurrent tumor. 3. Stable subcentimeter indistinct anterior upper peritoneal implants. 4. No evidence of metastatic disease in the chest. 5. Chronic findings include: Nonobstructing left nephrolithiasis. Stable small left paraumbilical hernia containing a small bowel loop without acute bowel complication. Aortic Atherosclerosis (ICD10-I70.0).  01/18/2020 -  Chemotherapy   The patient had  PACLitaxel-protein bound (ABRAXANE) chemo infusion 250 mg, 125 mg/m2 = 250 mg, Intravenous,  Once, 0 of 4 cycles ondansetron (ZOFRAN) injection 8 mg, 8 mg (100 % of original dose 8 mg), Intravenous,  Once, 0 of 4 cycles Dose modification: 8 mg (original dose 8 mg, Cycle 1) gemcitabine (GEMZAR) 2,052 mg in sodium chloride 0.9 % 250 mL chemo infusion, 1,000 mg/m2 = 2,052 mg, Intravenous,  Once, 0 of 4 cycles  for chemotherapy treatment.      REVIEW OF SYSTEMS:   Constitutional: Denies fevers, chills or abnormal weight loss Eyes: Denies blurriness of vision Ears, nose, mouth, throat, and face: Denies mucositis or sore throat Respiratory: Denies cough, dyspnea or wheezes Cardiovascular: Denies palpitation, chest discomfort or lower extremity swelling Gastrointestinal:  Denies nausea, heartburn or change in bowel habits Skin: Denies abnormal skin rashes Lymphatics: Denies new lymphadenopathy or easy bruising Neurological:Denies numbness, tingling or new weaknesses Behavioral/Psych: Mood is stable, no new changes  All other systems were reviewed with the patient and are negative.  I have reviewed the past medical history, past surgical history, social history and family history with the patient and they are unchanged from previous note.  ALLERGIES:  is allergic to codeine, penicillins, and bactrim [sulfamethoxazole-trimethoprim].  MEDICATIONS:  Current Outpatient Medications  Medication Sig Dispense Refill  . predniSONE (DELTASONE) 5 MG tablet TAKE 2 TABLETS BY MOUTH DAILY WITH BREAKFAST. 60 tablet 2  . cetirizine (ZYRTEC) 10 MG tablet Take 10 mg by mouth daily.    . citalopram (CELEXA) 20 MG tablet TAKE 1 TABLET BY MOUTH EVERY DAY 90 tablet 4  . CREON 24000-76000 units CPEP Take 1 capsule by mouth See admin instructions. Take 1 capsule before each meal and 1 capsule after each meal, take 1 cap with snacks    . diclofenac Sodium (VOLTAREN) 1 % GEL APPLY 2-4 GRAMS TO AFFECTED JOINT 4 TIMES  DAILY AS NEEDED. 400 g 1  . estradiol (ESTRACE VAGINAL) 0.1 MG/GM vaginal cream Place 1 Applicatorful vaginally 3 (three) times a week. 42.5 g 12  . fluticasone (FLONASE) 50 MCG/ACT nasal spray Place 1 spray into both nostrils daily.    . folic acid (FOLVITE) 1 MG tablet TAKE 2 TABLETS BY MOUTH EVERY DAY 180 tablet 3  . gabapentin (NEURONTIN) 300 MG capsule Take 1 capsule (300 mg total) by mouth 3 (three) times daily. 90 capsule 11  . HYDROmorphone (DILAUDID) 4 MG tablet Take 1 tablet (4 mg total) by mouth every 4 (four) hours as needed. 60 tablet 0  . hydrOXYzine (ATARAX/VISTARIL) 10 MG tablet Take 1 tablet (10 mg total) by mouth 3 (three) times daily as needed. 30 tablet 1  . lidocaine-prilocaine (EMLA) cream Apply 1 application topically daily as needed (port). Apply to affected area once 30 g 11  . LORazepam (ATIVAN) 0.5 MG tablet Take 1 tablet (0.5 mg total) by mouth every 8 (eight) hours as needed for anxiety (nausea). 30 tablet 0  . metFORMIN (GLUCOPHAGE) 500 MG tablet TAKE 1 TABLET BY MOUTH TWICE A DAY WITH A MEAL 180 tablet 11  . methocarbamol (ROBAXIN) 500 MG tablet TAKE 1 TABLET BY MOUTH 3 TIMES DAILY AS NEEDED FOR MUSCLE SPASMS. 90 tablet 0  . Multiple Vitamin (MULTIVITAMIN WITH MINERALS) TABS tablet Take 1 tablet by mouth daily.    Marland Kitchen omeprazole (PRILOSEC) 40 MG capsule Take 1 capsule (40 mg total) by mouth daily. 30 capsule 11  . ondansetron (ZOFRAN) 8 MG tablet  TAKE 1 TAB BY MOUTH EVERY 8HOURS AS NEEDED FOR REFRACTORY NAUSEA/VOMITING START ON DAY 3AFTER CHEMO 8 tablet 7  . prochlorperazine (COMPAZINE) 10 MG tablet Take 1 tablet (10 mg total) by mouth every 6 (six) hours as needed. 90 tablet 3  . rivaroxaban (XARELTO) 20 MG TABS tablet Take 1 tablet (20 mg total) by mouth daily with supper. 30 tablet 9  . simethicone (MYLICON) 882 MG chewable tablet Chew 125 mg by mouth every 6 (six) hours as needed for flatulence.     No current facility-administered medications for this visit.     PHYSICAL EXAMINATION: ECOG PERFORMANCE STATUS: 1 - Symptomatic but completely ambulatory  Vitals:   01/10/20 1414  BP: 110/88  Pulse: 77  Resp: 18  Temp: 99.2 F (37.3 C)  SpO2: 97%   Filed Weights   01/10/20 1414  Weight: 209 lb (94.8 kg)    GENERAL:alert, no distress and comfortable NEURO: alert & oriented x 3 with fluent speech, no focal motor/sensory deficits  LABORATORY DATA:  I have reviewed the data as listed    Component Value Date/Time   NA 139 12/28/2019 1109   K 4.3 12/28/2019 1109   CL 104 12/28/2019 1109   CO2 25 12/28/2019 1109   GLUCOSE 88 12/28/2019 1109   BUN 12 12/28/2019 1109   CREATININE 0.62 12/28/2019 1109   CREATININE 0.65 09/27/2019 1535   CREATININE 0.68 07/27/2017 0851   CALCIUM 9.1 12/28/2019 1109   PROT 7.2 12/28/2019 1109   ALBUMIN 3.6 12/28/2019 1109   AST 25 12/28/2019 1109   AST 62 (H) 09/27/2019 1535   ALT 37 12/28/2019 1109   ALT 60 (H) 09/27/2019 1535   ALKPHOS 83 12/28/2019 1109   BILITOT 0.2 (L) 12/28/2019 1109   BILITOT <0.2 (L) 09/27/2019 1535   GFRNONAA >60 12/28/2019 1109   GFRNONAA >60 09/27/2019 1535   GFRNONAA 106 07/27/2017 0851   GFRAA >60 11/09/2019 1012   GFRAA >60 09/27/2019 1535   GFRAA 123 07/27/2017 0851    No results found for: SPEP, UPEP  Lab Results  Component Value Date   WBC 14.0 (H) 12/28/2019   NEUTROABS 11.5 (H) 12/28/2019   HGB 11.8 (L) 12/28/2019   HCT 36.8 12/28/2019   MCV 87.6 12/28/2019   PLT 510 (H) 12/28/2019      Chemistry      Component Value Date/Time   NA 139 12/28/2019 1109   K 4.3 12/28/2019 1109   CL 104 12/28/2019 1109   CO2 25 12/28/2019 1109   BUN 12 12/28/2019 1109   CREATININE 0.62 12/28/2019 1109   CREATININE 0.65 09/27/2019 1535   CREATININE 0.68 07/27/2017 0851      Component Value Date/Time   CALCIUM 9.1 12/28/2019 1109   ALKPHOS 83 12/28/2019 1109   AST 25 12/28/2019 1109   AST 62 (H) 09/27/2019 1535   ALT 37 12/28/2019 1109   ALT 60 (H) 09/27/2019  1535   BILITOT 0.2 (L) 12/28/2019 1109   BILITOT <0.2 (L) 09/27/2019 1535       RADIOGRAPHIC STUDIES: I have reviewed multiple CT imaging with the patient I have personally reviewed the radiological images as listed and agreed with the findings in the report. CT CHEST ABDOMEN PELVIS W CONTRAST  Result Date: 01/10/2020 CLINICAL DATA:  Lynch syndrome. History of endometrial and right ovarian cancer status post [facility] 08/30/2017. 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 Adjuvant chemotherapy. Pancreatic resection margin recurrence 06/07/2019 with subsequent chemotherapy. Restaging. EXAM:  CT CHEST, ABDOMEN, AND PELVIS WITH CONTRAST TECHNIQUE: Multidetector CT imaging of the chest, abdomen and pelvis was performed following the standard protocol during bolus administration of intravenous contrast. CONTRAST:  120m OMNIPAQUE IOHEXOL 300 MG/ML  SOLN COMPARISON:  11/16/2019 CT abdomen/pelvis. 09/03/2019 CT chest, abdomen and pelvis. FINDINGS: CT CHEST FINDINGS Cardiovascular: Normal heart size. No significant pericardial effusion/thickening. Right internal jugular Port-A-Cath terminates in the right atrium. Great vessels are normal in course and caliber. No central pulmonary emboli. Mediastinum/Nodes: No discrete thyroid nodules. Unremarkable esophagus. No pathologically enlarged axillary, mediastinal or hilar lymph nodes. Lungs/Pleura: No pneumothorax. No pleural effusion. No acute consolidative airspace disease, lung masses or significant pulmonary nodules. Musculoskeletal: No aggressive appearing focal osseous lesions. Minimal thoracic spondylosis. CT ABDOMEN PELVIS FINDINGS Hepatobiliary: Numerous (greater than 15) small hypoenhancing liver masses scattered throughout the liver, largest 1.5 cm in the segment 6 right liver lobe (series 7/image 85), 0.9 cm in inferior segment 5 right liver lobe (series 7/image 97) and 0.9 cm in the segment 8 right  liver lobe (series 7/image 66), all new since 11/16/2019 CT. Stable 2.4 cm segment 4A left liver lobe hemangioma (series 7/image 63). Cholecystectomy. No biliary ductal dilatation. Pancreas: Status post pancreatic tail resection. Ill-defined 3.6 x 2.5 cm upper retroperitoneal mass (series 7/image 92) encasing celiac axis and abutting pancreatic resection margin, previously 3.6 x 2.4 cm using similar measurement technique, unchanged. No pancreatic duct dilation in the remnant pancreas. Spleen: Splenectomy. Adrenals/Urinary Tract: Normal adrenals. Nonobstructing 3 mm lower left renal stone. Otherwise normal kidneys, with no hydronephrosis. Normal bladder. Stomach/Bowel: Normal non-distended stomach. Stable small left paraumbilical hernia containing a small bowel loop with no small bowel wall thickening, pneumatosis or dilation. Oral contrast transits to the distal small bowel. Status post partial right hemicolectomy with intact appearing ileocolic anastomosis in the right abdomen. No colonic wall thickening, diverticulosis or acute pericolonic fat stranding in the remnant large-bowel. Vascular/Lymphatic: Atherosclerotic nonaneurysmal abdominal aorta. Patent portal, hepatic and renal veins. No pathologically enlarged lymph nodes in the abdomen or pelvis. Reproductive: Status post hysterectomy, with no abnormal findings at the vaginal cuff. No adnexal mass. Other: No pneumoperitoneum, ascites or focal fluid collection. A few scattered small indistinct soft tissue nodules in the anterior upper peritoneal cavity bilaterally, largest 0.6 cm near the midline (series 7/image 82), previously 0.6 cm using similar measurement technique, all unchanged. No appreciable new peritoneal implants. Musculoskeletal: No aggressive appearing focal osseous lesions. IMPRESSION: 1. Numerous small hypoenhancing liver masses scattered throughout the liver, all new since 11/16/2019 CT, compatible with new liver metastases. 2. Stable upper  retroperitoneal mass encasing the celiac axis and abutting the pancreatic resection margin, compatible with stable locally recurrent tumor. 3. Stable subcentimeter indistinct anterior upper peritoneal implants. 4. No evidence of metastatic disease in the chest. 5. Chronic findings include: Nonobstructing left nephrolithiasis. Stable small left paraumbilical hernia containing a small bowel loop without acute bowel complication. Aortic Atherosclerosis (ICD10-I70.0). Electronically Signed   By: JIlona SorrelM.D.   On: 01/10/2020 14:20   MM 3D SCREEN BREAST BILATERAL  Result Date: 01/08/2020 CLINICAL DATA:  Screening. EXAM: DIGITAL SCREENING BILATERAL MAMMOGRAM WITH TOMO AND CAD COMPARISON:  Previous exam(s). ACR Breast Density Category b: There are scattered areas of fibroglandular density. FINDINGS: There are no findings suspicious for malignancy. Port-A-Cath port overlies the low RIGHT axilla on the MLO images. Images were processed with CAD. IMPRESSION: No mammographic evidence of malignancy. A result letter of this screening mammogram will be mailed directly to the patient. RECOMMENDATION: Screening mammogram  in one year. (Code:SM-B-01Y) BI-RADS CATEGORY  1: Negative. Electronically Signed   By: Evangeline Dakin M.D.   On: 01/08/2020 09:21

## 2020-01-10 NOTE — Assessment & Plan Note (Addendum)
Her pain is well controlled She will continue pain medicine as prescribed

## 2020-01-10 NOTE — Assessment & Plan Note (Signed)
I reviewed multiple imaging studies with the patient Unfortunately, CT imaging from today show significant new findings with for new lesions in her liver Her tumor marker is markedly elevated Thankfully, she is not yet symptomatic We reviewed the guidelines and discussed options She is in agreement to proceed with combination of gemcitabine with Abraxane We discussed the risk, benefits, side effects of treatment including risk of pancytopenia, infection, hair loss and neuropathy and she is in agreement to proceed I will see her on day 8 of treatment In my experience, it is likely she will need dose adjustment in the future We could also consider changing her cycle 2-day 1 and 8, rest day 15 for cycle every 21 days but for now, she schedule weekly for 3 weeks and 1 week off, pending future evaluation Due to history of severe nausea, I will substitute Compazine with Zofran

## 2020-01-10 NOTE — Progress Notes (Signed)
DISCONTINUE ON PATHWAY REGIMEN - Pancreatic     A cycle is every 14 days:     Oxaliplatin      Leucovorin      Irinotecan      5-Fluorouracil   **Always confirm dose/schedule in your pharmacy ordering system**  REASON: Disease Progression PRIOR TREATMENT: PANOS91: mFOLFIRINOX q14 Days to a Total of 6 Months of Chemotherapy (Combined Neoadjuvant and Adjuvant) TREATMENT RESPONSE: Progressive Disease (PD)  START OFF PATHWAY REGIMEN - Pancreatic Adenocarcinoma   [id number]:Gemcitabine 1,000 mg/m2 IV D1,8,15 + Nab-Paclitaxel 125 mg/m2 IV D1,8,15 q28 Days:   A cycle is every 28 days:     Nab-paclitaxel (protein bound)      Gemcitabine   **Always confirm dose/schedule in your pharmacy ordering system**  Patient Characteristics: Metastatic Disease, Third Line and Beyond, MSS/pMMR or MSI Unknown Therapeutic Status: Metastatic Disease Line of Therapy: Third Engineer, civil (consulting) Status: Unknown Intent of Therapy: Non-Curative / Palliative Intent, Discussed with Patient

## 2020-01-10 NOTE — Assessment & Plan Note (Signed)
We have extensive discussions in the past about goals of care She is aware that treatment goal is palliative in nature

## 2020-01-15 ENCOUNTER — Other Ambulatory Visit: Payer: Self-pay | Admitting: Hematology and Oncology

## 2020-01-16 NOTE — Progress Notes (Signed)
.   Pharmacist Chemotherapy Monitoring - Initial Assessment    Anticipated start date: 01/19/20   Regimen:  . Are orders appropriate based on the patient's diagnosis, regimen, and cycle? Yes . Does the plan date match the patient's scheduled date? Yes . Is the sequencing of drugs appropriate? Yes . Are the premedications appropriate for the patient's regimen? Yes . Prior Authorization for treatment is: Approved o If applicable, is the correct biosimilar selected based on the patient's insurance? not applicable  Organ Function and Labs: Marland Kitchen Are dose adjustments needed based on the patient's renal function, hepatic function, or hematologic function? No . Are appropriate labs ordered prior to the start of patient's treatment? Yes . Other organ system assessment, if indicated: N/A . The following baseline labs, if indicated, have been ordered: N/A  Dose Assessment: . Are the drug doses appropriate? Yes . Are the following correct: o Drug concentrations Yes o IV fluid compatible with drug Yes o Administration routes Yes o Timing of therapy Yes . If applicable, does the patient have documented access for treatment and/or plans for port-a-cath placement? yes . If applicable, have lifetime cumulative doses been properly documented and assessed? not applicable Lifetime Dose Tracking  . Oxaliplatin: 821.594 mg/m2 (1,665 mg) = 136.93 % of the maximum lifetime dose of 600 mg/m2  . Carboplatin: 1,500 mg = 0.01 % of the maximum lifetime dose of 999,999,999 mg  o   Toxicity Monitoring/Prevention: . The patient has the following take home antiemetics prescribed: Prochlorperazine . The patient has the following take home medications prescribed: N/A . Medication allergies and previous infusion related reactions, if applicable, have been reviewed and addressed. Yes . The patient's current medication list has been assessed for drug-drug interactions with their chemotherapy regimen. no significant  drug-drug interactions were identified on review.  Order Review: . Are the treatment plan orders signed? Yes . Is the patient scheduled to see a provider prior to their treatment? No  I verify that I have reviewed each item in the above checklist and answered each question accordingly.  Wynona Neat 01/16/2020 1:08 PM

## 2020-01-18 ENCOUNTER — Inpatient Hospital Stay: Payer: BC Managed Care – PPO

## 2020-01-18 ENCOUNTER — Other Ambulatory Visit: Payer: Self-pay

## 2020-01-18 DIAGNOSIS — C541 Malignant neoplasm of endometrium: Secondary | ICD-10-CM

## 2020-01-18 DIAGNOSIS — C252 Malignant neoplasm of tail of pancreas: Secondary | ICD-10-CM

## 2020-01-18 DIAGNOSIS — M069 Rheumatoid arthritis, unspecified: Secondary | ICD-10-CM | POA: Diagnosis not present

## 2020-01-18 DIAGNOSIS — G893 Neoplasm related pain (acute) (chronic): Secondary | ICD-10-CM | POA: Diagnosis not present

## 2020-01-18 DIAGNOSIS — Z7984 Long term (current) use of oral hypoglycemic drugs: Secondary | ICD-10-CM | POA: Diagnosis not present

## 2020-01-18 DIAGNOSIS — C7961 Secondary malignant neoplasm of right ovary: Secondary | ICD-10-CM | POA: Diagnosis not present

## 2020-01-18 DIAGNOSIS — Z5111 Encounter for antineoplastic chemotherapy: Secondary | ICD-10-CM | POA: Diagnosis not present

## 2020-01-18 DIAGNOSIS — Z7189 Other specified counseling: Secondary | ICD-10-CM

## 2020-01-18 DIAGNOSIS — I2699 Other pulmonary embolism without acute cor pulmonale: Secondary | ICD-10-CM | POA: Diagnosis not present

## 2020-01-18 DIAGNOSIS — Z7901 Long term (current) use of anticoagulants: Secondary | ICD-10-CM | POA: Diagnosis not present

## 2020-01-18 DIAGNOSIS — Z5189 Encounter for other specified aftercare: Secondary | ICD-10-CM | POA: Diagnosis not present

## 2020-01-18 DIAGNOSIS — E098 Drug or chemical induced diabetes mellitus with unspecified complications: Secondary | ICD-10-CM | POA: Diagnosis not present

## 2020-01-18 LAB — CBC WITH DIFFERENTIAL (CANCER CENTER ONLY)
Abs Immature Granulocytes: 0.06 10*3/uL (ref 0.00–0.07)
Basophils Absolute: 0.1 10*3/uL (ref 0.0–0.1)
Basophils Relative: 1 %
Eosinophils Absolute: 0.1 10*3/uL (ref 0.0–0.5)
Eosinophils Relative: 0 %
HCT: 34.4 % — ABNORMAL LOW (ref 36.0–46.0)
Hemoglobin: 11.3 g/dL — ABNORMAL LOW (ref 12.0–15.0)
Immature Granulocytes: 1 %
Lymphocytes Relative: 7 %
Lymphs Abs: 1 10*3/uL (ref 0.7–4.0)
MCH: 28.8 pg (ref 26.0–34.0)
MCHC: 32.8 g/dL (ref 30.0–36.0)
MCV: 87.8 fL (ref 80.0–100.0)
Monocytes Absolute: 0.4 10*3/uL (ref 0.1–1.0)
Monocytes Relative: 3 %
Neutro Abs: 11.3 10*3/uL — ABNORMAL HIGH (ref 1.7–7.7)
Neutrophils Relative %: 88 %
Platelet Count: 328 10*3/uL (ref 150–400)
RBC: 3.92 MIL/uL (ref 3.87–5.11)
RDW: 16.7 % — ABNORMAL HIGH (ref 11.5–15.5)
WBC Count: 12.9 10*3/uL — ABNORMAL HIGH (ref 4.0–10.5)
nRBC: 0 % (ref 0.0–0.2)

## 2020-01-18 LAB — CMP (CANCER CENTER ONLY)
ALT: 48 U/L — ABNORMAL HIGH (ref 0–44)
AST: 45 U/L — ABNORMAL HIGH (ref 15–41)
Albumin: 3.5 g/dL (ref 3.5–5.0)
Alkaline Phosphatase: 94 U/L (ref 38–126)
Anion gap: 12 (ref 5–15)
BUN: 15 mg/dL (ref 6–20)
CO2: 22 mmol/L (ref 22–32)
Calcium: 9.2 mg/dL (ref 8.9–10.3)
Chloride: 104 mmol/L (ref 98–111)
Creatinine: 0.64 mg/dL (ref 0.44–1.00)
GFR, Estimated: >60 mL/min (ref >60)
Glucose, Bld: 105 mg/dL — ABNORMAL HIGH (ref 70–99)
Potassium: 4.7 mmol/L (ref 3.5–5.1)
Sodium: 138 mmol/L (ref 135–145)
Total Bilirubin: 0.3 mg/dL (ref 0.3–1.2)
Total Protein: 7.3 g/dL (ref 6.5–8.1)

## 2020-01-18 MED ORDER — SODIUM CHLORIDE 0.9% FLUSH
10.0000 mL | Freq: Once | INTRAVENOUS | Status: AC
  Administered 2020-01-18: 10 mL
  Filled 2020-01-18: qty 10

## 2020-01-18 MED ORDER — HEPARIN SOD (PORK) LOCK FLUSH 100 UNIT/ML IV SOLN
500.0000 [IU] | Freq: Once | INTRAVENOUS | Status: AC
  Administered 2020-01-18: 500 [IU]
  Filled 2020-01-18: qty 5

## 2020-01-19 ENCOUNTER — Inpatient Hospital Stay: Payer: BC Managed Care – PPO

## 2020-01-19 ENCOUNTER — Other Ambulatory Visit: Payer: Self-pay | Admitting: Rheumatology

## 2020-01-19 VITALS — BP 129/86 | HR 86 | Temp 97.6°F | Resp 17

## 2020-01-19 DIAGNOSIS — Z7984 Long term (current) use of oral hypoglycemic drugs: Secondary | ICD-10-CM | POA: Diagnosis not present

## 2020-01-19 DIAGNOSIS — C541 Malignant neoplasm of endometrium: Secondary | ICD-10-CM

## 2020-01-19 DIAGNOSIS — Z7189 Other specified counseling: Secondary | ICD-10-CM

## 2020-01-19 DIAGNOSIS — Z7901 Long term (current) use of anticoagulants: Secondary | ICD-10-CM | POA: Diagnosis not present

## 2020-01-19 DIAGNOSIS — Z5111 Encounter for antineoplastic chemotherapy: Secondary | ICD-10-CM | POA: Diagnosis not present

## 2020-01-19 DIAGNOSIS — E098 Drug or chemical induced diabetes mellitus with unspecified complications: Secondary | ICD-10-CM | POA: Diagnosis not present

## 2020-01-19 DIAGNOSIS — C7961 Secondary malignant neoplasm of right ovary: Secondary | ICD-10-CM | POA: Diagnosis not present

## 2020-01-19 DIAGNOSIS — C252 Malignant neoplasm of tail of pancreas: Secondary | ICD-10-CM

## 2020-01-19 DIAGNOSIS — Z5189 Encounter for other specified aftercare: Secondary | ICD-10-CM | POA: Diagnosis not present

## 2020-01-19 DIAGNOSIS — M069 Rheumatoid arthritis, unspecified: Secondary | ICD-10-CM | POA: Diagnosis not present

## 2020-01-19 DIAGNOSIS — G893 Neoplasm related pain (acute) (chronic): Secondary | ICD-10-CM | POA: Diagnosis not present

## 2020-01-19 DIAGNOSIS — I2699 Other pulmonary embolism without acute cor pulmonale: Secondary | ICD-10-CM | POA: Diagnosis not present

## 2020-01-19 LAB — CA 125: Cancer Antigen (CA) 125: 18 U/mL (ref 0.0–38.1)

## 2020-01-19 MED ORDER — PACLITAXEL PROTEIN-BOUND CHEMO INJECTION 100 MG
125.0000 mg/m2 | Freq: Once | INTRAVENOUS | Status: AC
  Administered 2020-01-19: 250 mg via INTRAVENOUS
  Filled 2020-01-19: qty 50

## 2020-01-19 MED ORDER — SODIUM CHLORIDE 0.9 % IV SOLN
2000.0000 mg | Freq: Once | INTRAVENOUS | Status: AC
  Administered 2020-01-19: 2000 mg via INTRAVENOUS
  Filled 2020-01-19: qty 52.6

## 2020-01-19 MED ORDER — ONDANSETRON HCL 4 MG/2ML IJ SOLN
8.0000 mg | Freq: Once | INTRAMUSCULAR | Status: AC
  Administered 2020-01-19: 8 mg via INTRAVENOUS

## 2020-01-19 MED ORDER — ALTEPLASE 2 MG IJ SOLR
INTRAMUSCULAR | Status: AC
  Filled 2020-01-19: qty 2

## 2020-01-19 MED ORDER — SODIUM CHLORIDE 0.9% FLUSH
10.0000 mL | INTRAVENOUS | Status: DC | PRN
  Administered 2020-01-19: 10 mL
  Filled 2020-01-19: qty 10

## 2020-01-19 MED ORDER — SODIUM CHLORIDE 0.9 % IV SOLN
Freq: Once | INTRAVENOUS | Status: AC
  Filled 2020-01-19: qty 250

## 2020-01-19 MED ORDER — ONDANSETRON HCL 4 MG/2ML IJ SOLN
INTRAMUSCULAR | Status: AC
  Filled 2020-01-19: qty 4

## 2020-01-19 MED ORDER — ALTEPLASE 2 MG IJ SOLR
2.0000 mg | Freq: Once | INTRAMUSCULAR | Status: AC | PRN
  Administered 2020-01-19: 2 mg
  Filled 2020-01-19: qty 2

## 2020-01-19 MED ORDER — HEPARIN SOD (PORK) LOCK FLUSH 100 UNIT/ML IV SOLN
500.0000 [IU] | Freq: Once | INTRAVENOUS | Status: AC | PRN
  Administered 2020-01-19: 500 [IU]
  Filled 2020-01-19: qty 5

## 2020-01-19 NOTE — Progress Notes (Signed)
Cathflo administered at 1100 due to no blood return.  1130 blood return checked with successful blood return.  Approximately 37ml of cathflo removed.

## 2020-01-19 NOTE — Patient Instructions (Addendum)
[facility] Discharge Instructions for Patients Receiving Chemotherapy  Today you received the following chemotherapy agents: Abraxane/Gemzar.  To help prevent nausea and vomiting after your treatment, we encourage you to take your nausea medication as directed.   If you develop nausea and vomiting that is not controlled by your nausea medication, call the clinic.   BELOW ARE SYMPTOMS THAT SHOULD BE REPORTED IMMEDIATELY:  *FEVER GREATER THAN 100.5 F  *CHILLS WITH OR WITHOUT FEVER  NAUSEA AND VOMITING THAT IS NOT CONTROLLED WITH YOUR NAUSEA MEDICATION  *UNUSUAL SHORTNESS OF BREATH  *UNUSUAL BRUISING OR BLEEDING  TENDERNESS IN MOUTH AND THROAT WITH OR WITHOUT PRESENCE OF ULCERS  *URINARY PROBLEMS  *BOWEL PROBLEMS  UNUSUAL RASH Items with * indicate a potential emergency and should be followed up as soon as possible.  Feel free to call the clinic should you have any questions or concerns. The clinic phone number is [phone].  Please show the [facility] at check-in to the Emergency Department and triage nurse.  Nanoparticle Albumin-Bound Paclitaxel injection What is this medicine? NANOPARTICLE ALBUMIN-BOUND PACLITAXEL (Na no PAHR ti kuhl al BYOO muhn-bound PAK li TAX el) is a chemotherapy drug. It targets fast dividing cells, like cancer cells, and causes these cells to die. This medicine is used to treat advanced breast cancer, lung cancer, and pancreatic cancer. This medicine may be used for other purposes; ask your health care provider or pharmacist if you have questions. COMMON BRAND NAME(S): Abraxane What should I tell my health care provider before I take this medicine? They need to know if you have any of these conditions:  kidney disease  liver disease  low blood counts, like low white cell, platelet, or red cell counts  lung or breathing disease, like asthma  tingling of the fingers or toes, or other nerve disorder  an unusual or  allergic reaction to paclitaxel, albumin, other chemotherapy, other medicines, foods, dyes, or preservatives  pregnant or trying to get pregnant  breast-feeding How should I use this medicine? This drug is given as an infusion into a vein. It is administered in a hospital or clinic by a specially trained health care professional. Talk to your pediatrician regarding the use of this medicine in children. Special care may be needed. Overdosage: If you think you have taken too much of this medicine contact a poison control center or emergency room at once. NOTE: This medicine is only for you. Do not share this medicine with others. What if I miss a dose? It is important not to miss your dose. Call your doctor or health care professional if you are unable to keep an appointment. What may interact with this medicine? This medicine may interact with the following medications:  antiviral medicines for hepatitis, HIV or AIDS  certain antibiotics like erythromycin and clarithromycin  certain medicines for fungal infections like ketoconazole and itraconazole  certain medicines for seizures like carbamazepine, phenobarbital, phenytoin  gemfibrozil  nefazodone  rifampin  St. John's wort This list may not describe all possible interactions. Give your health care provider a list of all the medicines, herbs, non-prescription drugs, or dietary supplements you use. Also tell them if you smoke, drink alcohol, or use illegal drugs. Some items may interact with your medicine. What should I watch for while using this medicine? Your condition will be monitored carefully while you are receiving this medicine. You will need important blood work done while you are taking this medicine. This medicine can cause serious allergic  reactions. If you experience allergic reactions like skin rash, itching or hives, swelling of the face, lips, or tongue, tell your doctor or health care professional right away. In some  cases, you may be given additional medicines to help with side effects. Follow all directions for their use. This drug may make you feel generally unwell. This is not uncommon, as chemotherapy can affect healthy cells as well as cancer cells. Report any side effects. Continue your course of treatment even though you feel ill unless your doctor tells you to stop. Call your doctor or health care professional for advice if you get a fever, chills or sore throat, or other symptoms of a cold or flu. Do not treat yourself. This drug decreases your body's ability to fight infections. Try to avoid being around people who are sick. This medicine may increase your risk to bruise or bleed. Call your doctor or health care professional if you notice any unusual bleeding. Be careful brushing and flossing your teeth or using a toothpick because you may get an infection or bleed more easily. If you have any dental work done, tell your dentist you are receiving this medicine. Avoid taking products that contain aspirin, acetaminophen, ibuprofen, naproxen, or ketoprofen unless instructed by your doctor. These medicines may hide a fever. Do not become pregnant while taking this medicine or for 6 months after stopping it. Women should inform their doctor if they wish to become pregnant or think they might be pregnant. Men should not father a child while taking this medicine or for 3 months after stopping it. There is a potential for serious side effects to an unborn child. Talk to your health care professional or pharmacist for more information. Do not breast-feed an infant while taking this medicine or for 2 weeks after stopping it. This medicine may interfere with the ability to get pregnant or to father a child. You should talk to your doctor or health care professional if you are concerned about your fertility. What side effects may I notice from receiving this medicine? Side effects that you should report to your doctor  or health care professional as soon as possible:  allergic reactions like skin rash, itching or hives, swelling of the face, lips, or tongue  breathing problems  changes in vision  fast, irregular heartbeat  low blood pressure  mouth sores  pain, tingling, numbness in the hands or feet  signs of decreased platelets or bleeding - bruising, pinpoint red spots on the skin, black, tarry stools, blood in the urine  signs of decreased red blood cells - unusually weak or tired, feeling faint or lightheaded, falls  signs of infection - fever or chills, cough, sore throat, pain or difficulty passing urine  signs and symptoms of liver injury like dark yellow or brown urine; general ill feeling or flu-like symptoms; light-colored stools; loss of appetite; nausea; right upper belly pain; unusually weak or tired; yellowing of the eyes or skin  swelling of the ankles, feet, hands  unusually slow heartbeat Side effects that usually do not require medical attention (report to your doctor or health care professional if they continue or are bothersome):  diarrhea  hair loss  loss of appetite  nausea, vomiting  tiredness This list may not describe all possible side effects. Call your doctor for medical advice about side effects. You may report side effects to FDA at 1-800-FDA-1088. Where should I keep my medicine? This drug is given in a hospital or clinic and will not  be stored at home. NOTE: This sheet is a summary. It may not cover all possible information. If you have questions about this medicine, talk to your doctor, pharmacist, or health care provider.  2020 Elsevier/Gold Standard (2016-10-12 13:03:45)  Gemcitabine injection What is this medicine? GEMCITABINE (jem SYE ta been) is a chemotherapy drug. This medicine is used to treat many types of cancer like breast cancer, lung cancer, pancreatic cancer, and ovarian cancer. This medicine may be used for other purposes; ask your  health care provider or pharmacist if you have questions. COMMON BRAND NAME(S): Gemzar, Infugem What should I tell my health care provider before I take this medicine? They need to know if you have any of these conditions:  blood disorders  infection  kidney disease  liver disease  lung or breathing disease, like asthma  recent or ongoing radiation therapy  an unusual or allergic reaction to gemcitabine, other chemotherapy, other medicines, foods, dyes, or preservatives  pregnant or trying to get pregnant  breast-feeding How should I use this medicine? This drug is given as an infusion into a vein. It is administered in a hospital or clinic by a specially trained health care professional. Talk to your pediatrician regarding the use of this medicine in children. Special care may be needed. Overdosage: If you think you have taken too much of this medicine contact a poison control center or emergency room at once. NOTE: This medicine is only for you. Do not share this medicine with others. What if I miss a dose? It is important not to miss your dose. Call your doctor or health care professional if you are unable to keep an appointment. What may interact with this medicine?  medicines to increase blood counts like filgrastim, pegfilgrastim, sargramostim  some other chemotherapy drugs like cisplatin  vaccines Talk to your doctor or health care professional before taking any of these medicines:  acetaminophen  aspirin  ibuprofen  ketoprofen  naproxen This list may not describe all possible interactions. Give your health care provider a list of all the medicines, herbs, non-prescription drugs, or dietary supplements you use. Also tell them if you smoke, drink alcohol, or use illegal drugs. Some items may interact with your medicine. What should I watch for while using this medicine? Visit your doctor for checks on your progress. This drug may make you feel generally unwell.  This is not uncommon, as chemotherapy can affect healthy cells as well as cancer cells. Report any side effects. Continue your course of treatment even though you feel ill unless your doctor tells you to stop. In some cases, you may be given additional medicines to help with side effects. Follow all directions for their use. Call your doctor or health care professional for advice if you get a fever, chills or sore throat, or other symptoms of a cold or flu. Do not treat yourself. This drug decreases your body's ability to fight infections. Try to avoid being around people who are sick. This medicine may increase your risk to bruise or bleed. Call your doctor or health care professional if you notice any unusual bleeding. Be careful brushing and flossing your teeth or using a toothpick because you may get an infection or bleed more easily. If you have any dental work done, tell your dentist you are receiving this medicine. Avoid taking products that contain aspirin, acetaminophen, ibuprofen, naproxen, or ketoprofen unless instructed by your doctor. These medicines may hide a fever. Do not become pregnant while taking this medicine  or for 6 months after stopping it. Women should inform their doctor if they wish to become pregnant or think they might be pregnant. Men should not father a child while taking this medicine and for 3 months after stopping it. There is a potential for serious side effects to an unborn child. Talk to your health care professional or pharmacist for more information. Do not breast-feed an infant while taking this medicine or for at least 1 week after stopping it. Men should inform their doctors if they wish to father a child. This medicine may lower sperm counts. Talk with your doctor or health care professional if you are concerned about your fertility. What side effects may I notice from receiving this medicine? Side effects that you should report to your doctor or health care  professional as soon as possible:  allergic reactions like skin rash, itching or hives, swelling of the face, lips, or tongue  breathing problems  pain, redness, or irritation at site where injected  signs and symptoms of a dangerous change in heartbeat or heart rhythm like chest pain; dizziness; fast or irregular heartbeat; palpitations; feeling faint or lightheaded, falls; breathing problems  signs of decreased platelets or bleeding - bruising, pinpoint red spots on the skin, black, tarry stools, blood in the urine  signs of decreased red blood cells - unusually weak or tired, feeling faint or lightheaded, falls  signs of infection - fever or chills, cough, sore throat, pain or difficulty passing urine  signs and symptoms of kidney injury like trouble passing urine or change in the amount of urine  signs and symptoms of liver injury like dark yellow or brown urine; general ill feeling or flu-like symptoms; light-colored stools; loss of appetite; nausea; right upper belly pain; unusually weak or tired; yellowing of the eyes or skin  swelling of ankles, feet, hands Side effects that usually do not require medical attention (report to your doctor or health care professional if they continue or are bothersome):  constipation  diarrhea  hair loss  loss of appetite  nausea  rash  vomiting This list may not describe all possible side effects. Call your doctor for medical advice about side effects. You may report side effects to FDA at 1-800-FDA-1088. Where should I keep my medicine? This drug is given in a hospital or clinic and will not be stored at home. NOTE: This sheet is a summary. It may not cover all possible information. If you have questions about this medicine, talk to your doctor, pharmacist, or health care provider.  2020 [employer]vier/Gold Standard (2017-05-04 18:06:11)

## 2020-01-21 ENCOUNTER — Telehealth: Payer: Self-pay | Admitting: *Deleted

## 2020-01-21 NOTE — Telephone Encounter (Signed)
Last Visit: 11/01/2019 Next Visit: 02/07/2020  Last Fill: 12/13/2019  Okay to refill Methocarbamol?

## 2020-01-23 DIAGNOSIS — F418 Other specified anxiety disorders: Secondary | ICD-10-CM | POA: Diagnosis not present

## 2020-01-23 DIAGNOSIS — C259 Malignant neoplasm of pancreas, unspecified: Secondary | ICD-10-CM | POA: Diagnosis not present

## 2020-01-23 DIAGNOSIS — M069 Rheumatoid arthritis, unspecified: Secondary | ICD-10-CM | POA: Diagnosis not present

## 2020-01-24 NOTE — Progress Notes (Deleted)
Office Visit Note  Patient: Dana Hicks             Date of Birth: 04-18-1975           MRN: [id number]             PCP: [name], MD Referring: [name], MD Visit Date: 02/07/2020 Occupation: '@GUAROCC' @  Subjective:  No chief complaint on file.   History of Present Illness: Dana Hicks is a 44 y.o. female ***   Activities of Daily Living:  Patient reports morning stiffness for *** {minute/hour:19697}.   Patient {ACTIONS;DENIES/REPORTS:21021675::"Denies"} nocturnal pain.  Difficulty dressing/grooming: {ACTIONS;DENIES/REPORTS:21021675::"Denies"} Difficulty climbing stairs: {ACTIONS;DENIES/REPORTS:21021675::"Denies"} Difficulty getting out of chair: {ACTIONS;DENIES/REPORTS:21021675::"Denies"} Difficulty using hands for taps, buttons, cutlery, and/or writing: {ACTIONS;DENIES/REPORTS:21021675::"Denies"}  No Rheumatology ROS completed.   PMFS History:  Patient Active Problem List   Diagnosis Date Noted  . Cancer associated pain 07/02/2019  . Goals of care, counseling/discussion 06/11/2019  . Weight loss 05/29/2019  . Abdominal pain 04/11/2019  . Pancreatitis, acute 04/09/2019  . Vaginal dryness, menopausal 01/23/2019  . S/P left unicompartmental knee replacement 11/27/2018  . UTI (urinary tract infection) 06/29/2018  . Pulmonary embolism ([place]) 04/10/2018  . Diarrhea due to drug 03/28/2018  . Steroid-induced diabetes ([place]) 03/14/2018  . Neutropenic fever ([place]) 02/14/2018  . Acute rhinitis 02/14/2018  . Tachycardia 02/14/2018  . Physical debility 02/07/2018  . Chemotherapy-induced nausea 02/07/2018  . Hot flashes due to surgical menopause 02/06/2018  . Anemia due to antineoplastic chemotherapy 12/15/2017  . Hypomagnesemia 12/15/2017  . Iron deficiency anemia 12/15/2017  . S/P splenectomy 12/15/2017  . Lynch syndrome 11/22/2017  . Anterior leg pain, right 11/21/2017  . Genetic testing 11/18/2017  . Inflammatory arthritis 11/08/2017  . Dysuria  11/02/2017  . Peripheral neuropathy due to chemotherapy ([place]) 10/22/2017  . Bone pain 10/21/2017  . Family history of colon cancer   . Malignant neoplasm of tail of pancreas ([place]) 10/17/2017  . Family history of uterine cancer 10/17/2017  . Sinusitis, chronic 09/15/2017  . Malabsorption due to disorder of pancreas 09/15/2017  . Endometrial cancer ([place]) 08/31/2017  . Secondary malignant neoplasm of right ovary ([place]) 08/31/2017  . Pustular folliculitis 35/36/1443  . MRSA colonization 05/12/2017  . Left foot pain 01/10/2017  . Osteoarthritis of both knees 12/01/2016  . Class 3 severe obesity due to excess calories without serious comorbidity with body mass index (BMI) of 40.0 to 44.9 in adult ([place]) 10/01/2016  . High risk medication use 02/06/2016  . Family history of rheumatoid arthritis 02/06/2016  . H/O seasonal allergies 02/06/2016  . Rheumatoid arthritis of multiple sites with negative rheumatoid factor ([place]) 02/03/2016  . History of colon cancer 02/03/2016  . HLA B27 (HLA B27 positive) 02/03/2016    Past Medical History:  Diagnosis Date  . Allergic rhinitis, seasonal   . 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 Depression   . Diabetes mellitus without complication ([place])   . Endometrial cancer ([place]) 08/2017   Stage IA, Grade 1  . GERD (gastroesophageal reflux disease)   . History of MRSA infection 01/2017   followed by infectious disease center--  recurrent pustular folliculitis  . Nephrolithiasis    bilateral nonobstructive calculi per CT 08-18-2017  . OA (osteoarthritis)   . Ovarian cancer, right ([place]) 08/2017   Stage II Grade 2 Endometrioid  . Pancreatic cancer ([place])   . Rheumatoid arthritis [facility])    rheumatologist-  dr devenswar-- treated  w/ oral prednisone daily and methotrexate injection every 3 wks    Family History  Problem Relation Age of Onset  . Arthritis/Rheumatoid Mother   . Uterine cancer  Mother 65  . Allergic rhinitis Father   . Heart disease Father   . Drug abuse Son   . Other Maternal Aunt        Unknown GYN CA  . Colon cancer Maternal Uncle 13  . Rectal cancer Maternal Uncle   . Diabetes Maternal Grandmother   . Cancer Maternal Grandfather        d. 64s of liver/lung cancer  . Angioedema Neg Hx   . Asthma Neg Hx   . Atopy Neg Hx   . Eczema Neg Hx   . Immunodeficiency Neg Hx   . Urticaria Neg Hx   . Esophageal cancer Neg Hx   . Stomach cancer Neg Hx    Past Surgical History:  Procedure Laterality Date  . BREAST LUMPECTOMY WITH RADIOACTIVE SEED LOCALIZATION Left 06/28/2014   Benign Procedure: RADIOACTIVE SEED LOCALIZATION LEFT BREAST LUMPECTOMY;  Surgeon: Excell Seltzer, MD;  Location: [facility];  Service: General;  Laterality: Left;  . COLONOSCOPY  06/19/14  . ESOPHAGOGASTRODUODENOSCOPY (EGD) WITH PROPOFOL N/A 06/07/2019   Procedure: ESOPHAGOGASTRODUODENOSCOPY (EGD) WITH PROPOFOL;  Surgeon: [name], MD;  Location: [facility];  Service: Endoscopy;  Laterality: N/A;  . EUS N/A 06/07/2019   Procedure: UPPER ENDOSCOPIC ULTRASOUND (EUS) LINEAR;  Surgeon: [name], MD;  Location: [facility];  Service: Endoscopy;  Laterality: N/A;  . EYE SURGERY Left    plug in tear duct  . FINE NEEDLE ASPIRATION N/A 06/07/2019   Procedure: FINE NEEDLE ASPIRATION (FNA) LINEAR;  Surgeon: [name], MD;  Location[facility];  Service: Endoscopy;  Laterality: N/A;  . IR IMAGING GUIDED PORT INSERTION  09/26/2017  . KNEE ARTHROSCOPY    . PANCREATECTOMY  11/25/2017  . PARTIAL KNEE ARTHROPLASTY Left 11/27/2018   Procedure: UNICOMPARTMENTAL KNEE;  Surgeon: Vickey Huger, MD;  Location: [facility];  Service: Orthopedics;  Laterality: Left;  . PORT A CATH REVISION  2001   in and out  . RIGHT COLECTOMY  06/02/2009   cecum cancer  . ROBOTIC ASSISTED TOTAL HYSTERECTOMY WITH BILATERAL SALPINGO OOPHERECTOMY Bilateral 08/30/2017   Procedure: XI ROBOTIC ASSISTED  TOTAL  HYSTERECTOMY BILATERAL  SALPINGO OOPHORECTOMY; LYSIS OF ADHESIONS;  Surgeon: Isabel Caprice, MD;  Location: [facility];  Service: Gynecology;  Laterality: Bilateral;  . SPLENECTOMY, PARTIAL  11/25/2017   Social History   Social History Narrative  . Not on file   Immunization History  Administered Date(s) Administered  . HiB (PRP-T) 12/04/2017  . Influenza,inj,Quad PF,6+ Mos 02/18/2017, 12/04/2017  . Meningococcal B Recombinant 12/04/2017  . Meningococcal Conjugate 12/04/2017  . Pneumococcal Conjugate-13 12/04/2017     Objective: Vital Signs: LMP 08/30/2017    Physical Exam   Musculoskeletal Exam: ***  CDAI Exam: CDAI Score: -- Patient Global: --; Provider Global: -- Swollen: --; Tender: -- Joint Exam 02/07/2020   No joint exam has been documented for this visit   There is currently no information documented on the homunculus. Go to the Rheumatology activity and complete the homunculus joint exam.  Investigation: No additional findings.  Imaging: CT CHEST ABDOMEN PELVIS W CONTRAST  Result Date: 01/10/2020 CLINICAL DATA:  Lynch syndrome. History of endometrial and right ovarian cancer status post [facility] 08/30/2017. 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 Adjuvant chemotherapy. Pancreatic resection margin recurrence  06/07/2019 with subsequent chemotherapy. Restaging. EXAM: CT CHEST, ABDOMEN, AND PELVIS WITH CONTRAST TECHNIQUE: Multidetector CT imaging of the chest, abdomen and pelvis was performed following the standard protocol during bolus administration of intravenous contrast. CONTRAST:  165m OMNIPAQUE IOHEXOL 300 MG/ML  SOLN COMPARISON:  11/16/2019 CT abdomen/pelvis. 09/03/2019 CT chest, abdomen and pelvis. FINDINGS: CT CHEST FINDINGS Cardiovascular: Normal heart size. No significant pericardial effusion/thickening. Right internal jugular Port-A-Cath terminates in the right atrium. Great vessels are  normal in course and caliber. No central pulmonary emboli. Mediastinum/Nodes: No discrete thyroid nodules. Unremarkable esophagus. No pathologically enlarged axillary, mediastinal or hilar lymph nodes. Lungs/Pleura: No pneumothorax. No pleural effusion. No acute consolidative airspace disease, lung masses or significant pulmonary nodules. Musculoskeletal: No aggressive appearing focal osseous lesions. Minimal thoracic spondylosis. CT ABDOMEN PELVIS FINDINGS Hepatobiliary: Numerous (greater than 15) small hypoenhancing liver masses scattered throughout the liver, largest 1.5 cm in the segment 6 right liver lobe (series 7/image 85), 0.9 cm in inferior segment 5 right liver lobe (series 7/image 97) and 0.9 cm in the segment 8 right liver lobe (series 7/image 66), all new since 11/16/2019 CT. Stable 2.4 cm segment 4A left liver lobe hemangioma (series 7/image 63). Cholecystectomy. No biliary ductal dilatation. Pancreas: Status post pancreatic tail resection. Ill-defined 3.6 x 2.5 cm upper retroperitoneal mass (series 7/image 92) encasing celiac axis and abutting pancreatic resection margin, previously 3.6 x 2.4 cm using similar measurement technique, unchanged. No pancreatic duct dilation in the remnant pancreas. Spleen: Splenectomy. Adrenals/Urinary Tract: Normal adrenals. Nonobstructing 3 mm lower left renal stone. Otherwise normal kidneys, with no hydronephrosis. Normal bladder. Stomach/Bowel: Normal non-distended stomach. Stable small left paraumbilical hernia containing a small bowel loop with no small bowel wall thickening, pneumatosis or dilation. Oral contrast transits to the distal small bowel. Status post partial right hemicolectomy with intact appearing ileocolic anastomosis in the right abdomen. No colonic wall thickening, diverticulosis or acute pericolonic fat stranding in the remnant large-bowel. Vascular/Lymphatic: Atherosclerotic nonaneurysmal abdominal aorta. Patent portal, hepatic and renal veins. No  pathologically enlarged lymph nodes in the abdomen or pelvis. Reproductive: Status post hysterectomy, with no abnormal findings at the vaginal cuff. No adnexal mass. Other: No pneumoperitoneum, ascites or focal fluid collection. A few scattered small indistinct soft tissue nodules in the anterior upper peritoneal cavity bilaterally, largest 0.6 cm near the midline (series 7/image 82), previously 0.6 cm using similar measurement technique, all unchanged. No appreciable new peritoneal implants. Musculoskeletal: No aggressive appearing focal osseous lesions. IMPRESSION: 1. Numerous small hypoenhancing liver masses scattered throughout the liver, all new since 11/16/2019 CT, compatible with new liver metastases. 2. Stable upper retroperitoneal mass encasing the celiac axis and abutting the pancreatic resection margin, compatible with stable locally recurrent tumor. 3. Stable subcentimeter indistinct anterior upper peritoneal implants. 4. No evidence of metastatic disease in the chest. 5. Chronic findings include: Nonobstructing left nephrolithiasis. Stable small left paraumbilical hernia containing a small bowel loop without acute bowel complication. Aortic Atherosclerosis (ICD10-I70.0). Electronically Signed   By: JIlona SorrelM.D.   On: 01/10/2020 14:20   MM 3D SCREEN BREAST BILATERAL  Result Date: 01/08/2020 CLINICAL DATA:  Screening. EXAM: DIGITAL SCREENING BILATERAL MAMMOGRAM WITH TOMO AND CAD COMPARISON:  Previous exam(s). ACR Breast Density Category b: There are scattered areas of fibroglandular density. FINDINGS: There are no findings suspicious for malignancy. Port-A-Cath port overlies the low RIGHT axilla on the MLO images. Images were processed with CAD. IMPRESSION: No mammographic evidence of malignancy. A result letter of this screening mammogram will be mailed directly  to the patient. RECOMMENDATION: Screening mammogram in one year. (Code:[id number]-B-01Y) BI-RADS CATEGORY  1: Negative. Electronically Signed    By: Evangeline Dakin M.D.   On: 01/08/2020 09:21    Recent Labs: Lab Results  Component Value Date   WBC 12.9 (H) 01/18/2020   HGB 11.3 (L) 01/18/2020   PLT 328 01/18/2020   NA 138 01/18/2020   K 4.7 01/18/2020   CL 104 01/18/2020   CO2 22 01/18/2020   GLUCOSE 105 (H) 01/18/2020   BUN 15 01/18/2020   CREATININE 0.64 01/18/2020   BILITOT 0.3 01/18/2020   ALKPHOS 94 01/18/2020   AST 45 (H) 01/18/2020   ALT 48 (H) 01/18/2020   PROT 7.3 01/18/2020   ALBUMIN 3.5 01/18/2020   CALCIUM 9.2 01/18/2020   GFRAA >60 11/09/2019   QFTBGOLDPLUS NEGATIVE 06/27/2018    Speciality Comments: No specialty comments available.  Procedures:  No procedures performed Allergies: Codeine, Penicillins, and Bactrim [sulfamethoxazole-trimethoprim]   Assessment / Plan:     Visit Diagnoses: No diagnosis found.  Orders: No orders of the defined types were placed in this encounter.  No orders of the defined types were placed in this encounter.   Face-to-face time spent with patient was *** minutes. Greater than 50% of time was spent in counseling and coordination of care.  Follow-Up Instructions: No follow-ups on file.   Earnestine Mealing, CMA  Note - This record has been created using Editor, commissioning.  Chart creation errors have been sought, but may not always  have been located. Such creation errors do not reflect on  the standard of medical care.

## 2020-01-25 ENCOUNTER — Other Ambulatory Visit: Payer: Self-pay

## 2020-01-25 ENCOUNTER — Encounter: Payer: Self-pay | Admitting: Rheumatology

## 2020-01-25 ENCOUNTER — Encounter: Payer: Self-pay | Admitting: Hematology and Oncology

## 2020-01-25 ENCOUNTER — Inpatient Hospital Stay: Payer: BC Managed Care – PPO | Attending: Hematology and Oncology

## 2020-01-25 ENCOUNTER — Inpatient Hospital Stay: Payer: BC Managed Care – PPO

## 2020-01-25 ENCOUNTER — Inpatient Hospital Stay (HOSPITAL_BASED_OUTPATIENT_CLINIC_OR_DEPARTMENT_OTHER): Payer: BC Managed Care – PPO | Admitting: Hematology and Oncology

## 2020-01-25 DIAGNOSIS — Z7189 Other specified counseling: Secondary | ICD-10-CM

## 2020-01-25 DIAGNOSIS — Z5111 Encounter for antineoplastic chemotherapy: Secondary | ICD-10-CM | POA: Insufficient documentation

## 2020-01-25 DIAGNOSIS — C541 Malignant neoplasm of endometrium: Secondary | ICD-10-CM

## 2020-01-25 DIAGNOSIS — C252 Malignant neoplasm of tail of pancreas: Secondary | ICD-10-CM

## 2020-01-25 DIAGNOSIS — K1231 Oral mucositis (ulcerative) due to antineoplastic therapy: Secondary | ICD-10-CM | POA: Diagnosis not present

## 2020-01-25 DIAGNOSIS — C7961 Secondary malignant neoplasm of right ovary: Secondary | ICD-10-CM | POA: Insufficient documentation

## 2020-01-25 DIAGNOSIS — G893 Neoplasm related pain (acute) (chronic): Secondary | ICD-10-CM

## 2020-01-25 LAB — CBC WITH DIFFERENTIAL (CANCER CENTER ONLY)
Abs Immature Granulocytes: 0.03 10*3/uL (ref 0.00–0.07)
Basophils Absolute: 0.1 10*3/uL (ref 0.0–0.1)
Basophils Relative: 1 %
Eosinophils Absolute: 0 10*3/uL (ref 0.0–0.5)
Eosinophils Relative: 0 %
HCT: 33.1 % — ABNORMAL LOW (ref 36.0–46.0)
Hemoglobin: 10.8 g/dL — ABNORMAL LOW (ref 12.0–15.0)
Immature Granulocytes: 0 %
Lymphocytes Relative: 16 %
Lymphs Abs: 1.2 10*3/uL (ref 0.7–4.0)
MCH: 28.8 pg (ref 26.0–34.0)
MCHC: 32.6 g/dL (ref 30.0–36.0)
MCV: 88.3 fL (ref 80.0–100.0)
Monocytes Absolute: 0.2 10*3/uL (ref 0.1–1.0)
Monocytes Relative: 2 %
Neutro Abs: 5.9 10*3/uL (ref 1.7–7.7)
Neutrophils Relative %: 81 %
Platelet Count: 437 10*3/uL — ABNORMAL HIGH (ref 150–400)
RBC: 3.75 MIL/uL — ABNORMAL LOW (ref 3.87–5.11)
RDW: 15.8 % — ABNORMAL HIGH (ref 11.5–15.5)
WBC Count: 7.3 10*3/uL (ref 4.0–10.5)
nRBC: 0.3 % — ABNORMAL HIGH (ref 0.0–0.2)

## 2020-01-25 LAB — CMP (CANCER CENTER ONLY)
ALT: 44 U/L (ref 0–44)
AST: 30 U/L (ref 15–41)
Albumin: 3.6 g/dL (ref 3.5–5.0)
Alkaline Phosphatase: 105 U/L (ref 38–126)
Anion gap: 12 (ref 5–15)
BUN: 13 mg/dL (ref 6–20)
CO2: 25 mmol/L (ref 22–32)
Calcium: 9.9 mg/dL (ref 8.9–10.3)
Chloride: 103 mmol/L (ref 98–111)
Creatinine: 0.64 mg/dL (ref 0.44–1.00)
GFR, Estimated: >60 mL/min (ref >60)
Glucose, Bld: 107 mg/dL — ABNORMAL HIGH (ref 70–99)
Potassium: 4.6 mmol/L (ref 3.5–5.1)
Sodium: 140 mmol/L (ref 135–145)
Total Bilirubin: <0.2 mg/dL — ABNORMAL LOW (ref 0.3–1.2)
Total Protein: 7.5 g/dL (ref 6.5–8.1)

## 2020-01-25 MED ORDER — ONDANSETRON HCL 4 MG/2ML IJ SOLN
8.0000 mg | Freq: Once | INTRAMUSCULAR | Status: AC
  Administered 2020-01-25: 8 mg via INTRAVENOUS

## 2020-01-25 MED ORDER — MAGIC MOUTHWASH W/LIDOCAINE: 5.0000 mL | Freq: Four times a day (QID) | ORAL | 0 refills | Status: DC

## 2020-01-25 MED ORDER — SODIUM CHLORIDE 0.9% FLUSH
10.0000 mL | INTRAVENOUS | Status: DC | PRN
  Administered 2020-01-25: 10 mL
  Filled 2020-01-25: qty 10

## 2020-01-25 MED ORDER — HEPARIN SOD (PORK) LOCK FLUSH 100 UNIT/ML IV SOLN
500.0000 [IU] | Freq: Once | INTRAVENOUS | Status: AC | PRN
  Administered 2020-01-25: 500 [IU]
  Filled 2020-01-25: qty 5

## 2020-01-25 MED ORDER — PACLITAXEL PROTEIN-BOUND CHEMO INJECTION 100 MG
125.0000 mg/m2 | Freq: Once | INTRAVENOUS | Status: AC
  Administered 2020-01-25: 250 mg via INTRAVENOUS
  Filled 2020-01-25: qty 50

## 2020-01-25 MED ORDER — SODIUM CHLORIDE 0.9 % IV SOLN
Freq: Once | INTRAVENOUS | Status: AC
  Filled 2020-01-25: qty 250

## 2020-01-25 MED ORDER — SODIUM CHLORIDE 0.9 % IV SOLN
2000.0000 mg | Freq: Once | INTRAVENOUS | Status: AC
  Administered 2020-01-25: 2000 mg via INTRAVENOUS
  Filled 2020-01-25: qty 52.6

## 2020-01-25 MED ORDER — ONDANSETRON HCL 4 MG/2ML IJ SOLN
INTRAMUSCULAR | Status: AC
  Filled 2020-01-25: qty 4

## 2020-01-25 MED FILL — MAGIC MW (LID/NYS/MAA/BEN): 12 days supply | Qty: 240 | Fill #0

## 2020-01-25 NOTE — Assessment & Plan Note (Signed)
She tolerated current chemotherapy well without major side effects except for occasional constipation and a little bit of mucositis We will proceed with treatment without delay She will return next week to get her labs checked again If her counts are stable, she will proceed with treatment If her counts are low next week, we will cancel her treatment and I will adjust her schedule She is in agreement

## 2020-01-25 NOTE — Assessment & Plan Note (Signed)
She has mild mucositis on exam likely secondary to side effects of treatment She will continue biotin I will also call in prescription Magic mouthwash with lidocaine swish and spit as needed

## 2020-01-25 NOTE — Assessment & Plan Note (Signed)
She has less pain since she started on chemo She will continue to take Dilaudid as needed

## 2020-01-25 NOTE — Patient Instructions (Signed)
[facility] Discharge Instructions for Patients Receiving Chemotherapy  Today you received the following chemotherapy agents: Abraxane/Gemzar.  To help prevent nausea and vomiting after your treatment, we encourage you to take your nausea medication as directed.   If you develop nausea and vomiting that is not controlled by your nausea medication, call the clinic.   BELOW ARE SYMPTOMS THAT SHOULD BE REPORTED IMMEDIATELY:  *FEVER GREATER THAN 100.5 F  *CHILLS WITH OR WITHOUT FEVER  NAUSEA AND VOMITING THAT IS NOT CONTROLLED WITH YOUR NAUSEA MEDICATION  *UNUSUAL SHORTNESS OF BREATH  *UNUSUAL BRUISING OR BLEEDING  TENDERNESS IN MOUTH AND THROAT WITH OR WITHOUT PRESENCE OF ULCERS  *URINARY PROBLEMS  *BOWEL PROBLEMS  UNUSUAL RASH Items with * indicate a potential emergency and should be followed up as soon as possible.  Feel free to call the clinic should you have any questions or concerns. The clinic phone number is [phone].  Please show the CHEMO ALERT CARD at check-in to the Emergency Department and triage nurse.   

## 2020-01-25 NOTE — Progress Notes (Signed)
[facility] OFFICE PROGRESS NOTE  Patient Care Team: [name], MD as PCP - General (Family Medicine)  ASSESSMENT & PLAN:  Malignant neoplasm of tail of pancreas [facility]) She tolerated current chemotherapy well without major side effects except for occasional constipation and a little bit of mucositis We will proceed with treatment without delay She will return next week to get her labs checked again If her counts are stable, she will proceed with treatment If her counts are low next week, we will cancel her treatment and I will adjust her schedule She is in agreement  Cancer associated pain She has less pain since she started on chemo She will continue to take Dilaudid as needed  Mucositis due to chemotherapy She has mild mucositis on exam likely secondary to side effects of treatment She will continue biotin I will also call in prescription Magic mouthwash with lidocaine swish and spit as needed   No orders of the defined types were placed in this encounter.   All questions were answered. The patient knows to call the clinic with any problems, questions or concerns. The total time spent in the appointment was 20 minutes encounter with patients including review of chart and various tests results, discussions about plan of care and coordination of care plan   Heath Lark, MD 01/25/2020 1:21 PM  INTERVAL HISTORY: Please see below for problem oriented charting. 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 Mixed attenuation mass emanates from the right adnexa measuring 10.8 x 8.0 cm very suspicious for right ovarian carcinoma. 2. Abnormality of the tail of the pancreas may be due to mild pancreatitis and small pseudocyst formation, but neoplasm cannot be excluded. 3. Small amount of ascites within abdomen and pelvis. 4. Small nonobstructing renal calculi bilaterally.    08/30/2017 Pathology Results   1. Uterus and cervix, with left fallopian tube - ENDOMETRIOID ADENOCARCINOMA, FIGO GRADE I, ARISING IN A BACKGROUND OF DIFFUSE COMPLEX ATYPICAL HYPERPLASIA. - CARCINOMA INVADES FOR OF DEPTH OF 0.2 CM WHERE THICKNESS OF MYOMETRIAL WALL IS 2.1 CM. - ALL RESECTION MARGINS ARE NEGATIVE FOR CARCINOMA. - NEGATIVE FOR LYMPHOVASCULAR OR PERINEURAL INVASION. - CERVICAL STROMA IS NOT INVOLVED. - SEE ONCOLOGY TABLE. - SEE NOTE 2. Ovary and fallopian tube, right - PRIMARY OVARIAN ENDOMETRIOID ADENOCARCINOMA, FIGO GRADE II, 12 CM. - THE OVARIAN SURFACE IS FOCALLY INVOLVED BY CARCINOMA. - NEGATIVE FOR LYMPHOVASCULAR INVASION. - BENIGN UNREMARKABLE FALLOPIAN TUBE, NEGATIVE FOR CARCINOMA. - SEE ONCOLOGY TABLE. - SEE NOTE 3. Cul-de-sac biopsy - METASTATIC ADENOCARCINOMA, MOST CONSISTENT WITH PRIMARY OVARIAN ENDOMETRIOID ADENOCARCINOMA. 4. Ovary, left - BENIGN UNREMARKABLE OVARY, NEGATIVE FOR MALIGNANCY. Microscopic Comment 1. UTERUS, CARCINOMA OR CARCINOSARCOMA Procedure: Total hysterectomy with bilateral salpingo-oophorectomy. Histologic type: Endometrioid adenocarcinoma. Histologic Grade: FIGO Grade I Myometrial invasion: Depth of invasion: 2 mm Myometrial thickness: 21 mm Uterine Serosa Involvement: Not identified Cervical stromal involvement: Not identified Extent of involvement of other organs: Not applicable Lymphovascular invasion: Not identified Regional Lymph Nodes: Examined: 0 Sentinel 0 Non-sentinel 0 Total Tumor block for  ancillary studies:  1H MMR / MSI testing: Pending Pathologic Stage Classification (pTNM, AJCC 8th edition): pT1a, pNX (v4.1.0.0) 2. OVARY or FALLOPIAN TUBE or PRIMARY PERITONEUM: Procedure: Salpingo-oophorectomy Specimen Integrity: Intact Tumor Site: Right ovary Ovarian Surface Involvement (required only if applicable): Focally involved by carcinoma Fallopian Tube Surface Involvement (required only if applicable): Not identified Tumor Size: 12 cm Histologic Type: Endometrioid adenocarcinoma Histologic Grade: Grade II Implants (required for advanced stage serous/seromucinous borderline tumors only): Not applicable Other Tissue/ Organ Involvement: Cul de sac biopsy involved by tumor Largest Extrapelvic Peritoneal Focus (required only if applicable): Not applicable Peritoneal/Ascitic Fluid: Negative for carcinoma (case # [id number]) Treatment Effect (required only for high-grade serous carcinomas): Not applicable Regional Lymph Nodes: No lymph nodes submitted or found Number of Lymph Nodes Examined: 0 Pathologic Stage Classification (pTNM, AJCC 8th Edition): pT2b, pN0 Representative Tumor Block: 2B and 2E 1. Molecular study for microsatellite instability and immunohistochemical stains for MMR-related proteins are pending and will be reported in an addendum. 2. Immunohistochemical stain show that the ovarian tumor is positive for CK7 and PAX8 (both diffuse), CDX2 (patchy and weak); and negative for CK20. This immunoprofile is consistent with the above diagnosis. Dr. Lyndon Code has reviewed this case and concurs with the above diagnosis. Molecular study for microsatellite instability and immunohistochemical stains for MMR-related proteins are pending and will be reported in an addendum   08/30/2017 Genetic Testing   Patient has genetic testing done for MSI  Results revealed patient has the following mutation(s): loss of [facility] 2   08/30/2017 Surgery   Surgeon: Mart Piggs, MD Pre-operative  Diagnosis:  1. Adnexal mass 2. Abnormal uterine bleeding 3. H/o Cecal CA  Post-operative Diagnosis:  1. Adhesive disease post colon resection 2. Endometrial cancer NOS 3. Adenocarcinoma unknown origin, right ovary, suspicious for GI primary  Operation:  1. Lysis of adhesions ~30 minutes 2. Robotic-assisted laparoscopic total hysterectomy with right salpingo-oophorectomy and left salpingectomy 3. Left oophorectomy (RA-laparoscopic) 4. Pelvic washings  Findings: Adhesions of omentum to anterior abdominal wall. Enlarged cystic right ovary ~10cm. Uterus had small nodules on serosa near where right adnexa was intimate with the surface. No obvious intraoperative rupture of cyst, although in 2 areas the wall was thin and one of these areas had some bleeding. Slight scarring of left bladder dome to LUS/cervix. Uterus on frozen section c/w hyperplasia and a small focus of endometrial CA - no myo invasion, <2cm in size. Frozen section on the right adnexa was carcinoma, met from colon or possibly Gyn, favor GI primary, defer to permanent. Left ovary was WNL.     09/15/2017 Cancer Staging   Staging form: Corpus Uteri - Carcinoma and Carcinosarcoma, AJCC 8th Edition - Pathologic: FIGO Stage IA (pT1a, pN0, cM0) - Signed by Heath Lark, MD on 09/15/2017   09/26/2017 Procedure   Successful placement of a right internal jugular approach power injectable Port-A-Cath. The catheter is ready for immediate use.   11/07/2017 Imaging   1. Since 08/18/2017, similar to slight decrease in size of a pancreatic body/tail junction lesion. Cross modality comparison relative to 09/16/2017 MRI is also grossly similar. 2. No evidence of metastatic disease. 3. Aortic Atherosclerosis (ICD10-I70.0).  4. Left nephrolithiasis.   11/18/2017 Genetic Testing   MSH2 c.2237dupT pathogenic mutation identified in the CancerNext panel.  The CancerNext gene panel offered by [employer] includes sequencing and rearrangement  analysis for the following 34 genes:   APC, ATM, BARD1, BMPR1A, BRCA1, BRCA2, BRIP1, CDH1, CDK4, CDKN2A, CHEK2, DICER1, HOXB13, EPCAM, GREM1, MLH1, MRE11A, MSH2,  MSH6, MUTYH, NBN, NF1, PALB2, PMS2, POLD1, POLE, PTEN, RAD50, RAD51C, RAD51D, SMAD4, SMARCA4, STK11, and TP53.  The report date is November 18, 2017.  MSH2 [id number] pathogenic mutation identified on somatic testing.  These results are consistent with a diagnosis of Lynch syndrome.   06/18/2019 - 08/14/2019 Chemotherapy   The patient had FOLFIRINOX for chemotherapy treatment.     01/19/2020 -  Chemotherapy   The patient had PACLitaxel-protein bound (ABRAXANE) chemo infusion 250 mg, 125 mg/m2 = 250 mg, Intravenous,  Once, 1 of 4 cycles Administration: 250 mg (01/19/2020) ondansetron (ZOFRAN) injection 8 mg, 8 mg (100 % of original dose 8 mg), Intravenous,  Once, 1 of 4 cycles Dose modification: 8 mg (original dose 8 mg, Cycle 1) Administration: 8 mg (01/19/2020) gemcitabine (GEMZAR) 2,000 mg in sodium chloride 0.9 % 250 mL chemo infusion, 2,052 mg, Intravenous,  Once, 1 of 4 cycles Administration: 2,000 mg (01/19/2020)  for chemotherapy treatment.    Secondary malignant neoplasm of right ovary ([place])  08/23/2017 Tumor Marker   Patient's tumor was tested for the following markers: CA-125 Results of the tumor marker test revealed 139.8   08/31/2017 Initial Diagnosis   Secondary malignant neoplasm of right ovary ([place])   09/15/2017 Cancer Staging   Staging form: Ovary, Fallopian Tube, and Primary Peritoneal Carcinoma, AJCC 8th Edition - Pathologic: Stage IIB (pT2b, pN0, cM0) - Signed by Heath Lark, MD on 09/15/2017   09/27/2017 Imaging   No evidence of metastatic disease or other acute findings within the thorax.  4 cm low-attenuation mass in pancreatic tail, highly suspicious for pancreatic carcinoma. This is caused splenic vein thrombosis, with new venous collaterals in the left upper quadrant. Consider endoscopic  ultrasound with FNA for tissue diagnosis.  Stable benign hepatic hemangioma.    09/27/2017 Tumor Marker   Patient's tumor was tested for the following markers: CA-125 Results of the tumor marker test revealed 39.4   11/02/2017 Tumor Marker   Patient's tumor was tested for the following markers: CA-125 Results of the tumor marker test revealed 19   11/07/2017 Tumor Marker   Patient's tumor was tested for the following markers: CA-125 Results of the tumor marker test revealed 18.3   11/18/2017 Genetic Testing   MSH2 c.2237dupT pathogenic mutation identified in the CancerNext panel.  The CancerNext gene panel offered by [employer] includes sequencing and rearrangement analysis for the following 34 genes:   APC, ATM, BARD1, BMPR1A, BRCA1, BRCA2, BRIP1, CDH1, CDK4, CDKN2A, CHEK2, DICER1, HOXB13, EPCAM, GREM1, MLH1, MRE11A, MSH2, MSH6, MUTYH, NBN, NF1, PALB2, PMS2, POLD1, POLE, PTEN, RAD50, RAD51C, RAD51D, SMAD4, SMARCA4, STK11, and TP53.  The report date is November 18, 2017.  MSH2 [id number] pathogenic mutation identified on somatic testing.  These results are consistent with a diagnosis of Lynch syndrome.   12/15/2017 Tumor Marker   Patient's tumor was tested for the following markers: CA-125 Results of the tumor marker test revealed 57.3   01/16/2018 Tumor Marker   Patient's tumor was tested for the following markers: CA-125 Results of the tumor marker test revealed 24.2   04/10/2018 Tumor Marker   Patient's tumor was tested for the following markers: CA-125 Results of the tumor marker test revealed 18.2   07/12/2018 Tumor Marker   Patient's tumor was tested for the following markers: CA-125 Results of the tumor marker test revealed 11.8   10/16/2018 Tumor Marker   Patient's tumor was tested for the following markers: CA125 Results of the tumor marker test revealed 12.4.   01/18/2020 Tumor Marker  Patient's tumor was tested for the following markers:  CA-125 Results of the tumor marker test revealed 18   Malignant neoplasm of tail of pancreas ([place])  10/13/2017 Pathology Results   Pancreas tail mass, endoscopic ultrasound-guided, fine needle aspiration II (smears and cell block): Adenocarcinoma   11/18/2017 Genetic Testing   MSH2 c.2237dupT pathogenic mutation identified in the CancerNext panel.  The CancerNext gene panel offered by [employer] includes sequencing and rearrangement analysis for the following 34 genes:   APC, ATM, BARD1, BMPR1A, BRCA1, BRCA2, BRIP1, CDH1, CDK4, CDKN2A, CHEK2, DICER1, HOXB13, EPCAM, GREM1, MLH1, MRE11A, MSH2, MSH6, MUTYH, NBN, NF1, PALB2, PMS2, POLD1, POLE, PTEN, RAD50, RAD51C, RAD51D, SMAD4, SMARCA4, STK11, and TP53.  The report date is November 18, 2017.  MSH2 [id number] pathogenic mutation identified on somatic testing.  These results are consistent with a diagnosis of Lynch syndrome.   11/24/2017 Pathology Results   A. "TAIL OF PANCREAS AND SPLEEN", DISTAL PANCREATECTOMY AND SPLENECTOMY: Invasive ductal adenocarcinoma, moderately to poorly differentiated with focal signet ring cell features, of pancreas (distal).   The carcinoma is 2.5 cm in greatest dimension grossly.   Treatment effects present in the form of fibrosis (50%). No lymphovascular or definite perineural invasion identified. All surgical margins are negative for tumor or high-grade dysplasia. Adjacent mucinous neoplasm, most consistent with Intraductal papillary mucinous neoplasm (IPMN) with low-grade dysplasia.   Uninvolved pancreas show atrophy and focal acute inflammation.   Twelve benign lymph nodes (0/12). Spleen with no significant histopathologic abnormalities.  PROCEDURE: distal pancreatectomy and splenectomy TUMOR SITE: distal pancreas TUMOR SIZE:  GREATEST DIMENSION: 2.5 cm  ADDITIONAL DIMENSIONS:  x  cm HISTOLOGIC TYPE: ductal  adenocarcinoma HISTOLOGIC GRADE: grade 3 TUMOR EXTENSION: peripancreatic soft tissue MARGINS: negative for tumor TREATMENT EFFECT: treatment effects present in the form of fibrosis (50%). LYMPHOVASCULAR INVASION: not identified PERINEURAL INVASION: no definite evidence REGIONAL LYMPH NODES:   NUMBER OF LYMPH NODES INVOLVED: 0   NUMBER OF LYMPH NODES EXAMINED: 12 PATHOLOGIC STAGE CLASSIFICATION (pTNM, AJCC 8th Ed): ypT2, ypN0 DISTANT METASTASIS (pM): pMx ADDITIONAL PATHOLOGIC FINDINGS: mucinous neoplasm, most consistent with intraductal papillary mucinous neoplasm (IPMN) with low-grade dysplasia, is identified adjacent to the main tumor.   11/24/2017 Cancer Staging   Staging form: Exocrine Pancreas, AJCC 8th Edition - Pathologic stage from 11/24/2017: Stage IB (pT2, pN0, cM0) - Signed by Truitt Merle, MD on 01/03/2018   11/25/2017 Surgery   She had surgery at [facility] 1. Exploratory Laparotomy 2. Distal Pancreatectomy and Splenectomy 3. Intraoperative Ultrasound 4. Open Cholecystectomy    12/15/2017 Cancer Staging   Staging form: Exocrine Pancreas, AJCC 8th Edition - Clinical: Stage IB (cT2, cN0, cM0) - Signed by Heath Lark, MD on 12/15/2017   12/28/2017 Imaging   12/28/2017 CT Abdomen  IMPRESSION: 1. Postoperative findings from recent partial pancreatectomy including a 21 cubic cm fluid collection along the pancreatic resection margin which could represent early pseudocyst. 2. Nodularity along the lateral limb of the left adrenal gland could also be postoperative but merit surveillance, as the pancreatic lesion was in close proximity to this adrenal gland on the prior CT of 11/07/2017. 3. Asymmetric fullness inferiorly in the left breast. The patient has a history of prior left breast procedures, correlation with mammography is recommended. 4. Other imaging findings of potential clinical significance: Stable hemangioma in the left hepatic lobe. Splenectomy. Aortic  Atherosclerosis (ICD10-I70.0). Right hemicolectomy. Small focus of fat necrosis in the right anterior abdominal wall subcutaneous tissues near the laparotomy site. Bilateral nonobstructive nephrolithiasis.   01/02/2018  Tumor Marker   Patient's tumor was tested for the following markers: CA-19-9 Results of the tumor marker test revealed 5   01/03/2018 - 06/05/2018 Chemotherapy   She received modified dose FOLFIRINOX   04/10/2018 Imaging   1. Distal pancreatectomy, without findings of recurrent or metastatic disease. 2. Incompletely imaged hypoenhancement within lower lobe right pulmonary artery branch is likely chronic (but interval since 10/09/2017) pulmonary embolism. Dedicated CTA could further evaluate. 3. Decrease in size of a peripancreatic complex fluid collection anteriorly, likely a resolving pseudocyst. 4. Decreased size of minimal fluid versus a borderline sized node in the gastrohepatic ligament. Recommend attention on follow-up. 5. Hepatic steatosis with a segment 4 hemangioma. 6. Aortic Atherosclerosis (ICD10-I70.0). This is significantly age advanced.   07/12/2018 Tumor Marker   Patient's tumor was tested for the following markers: CA-19-9 Results of the tumor marker test revealed 6   07/12/2018 Imaging   1. Status post distal pancreatectomy with stable postoperative fluid collection adjacent to the ventral aspect of the pancreatic head. No findings to suggest metastatic disease in the abdomen or pelvis. 2. Hepatic steatosis with small cavernous hemangioma in segment 4A of the liver. 3. Slight decreased size of nonenlarged gastrohepatic ligament lymph node, presumably benign. 4. Aortic atherosclerosis.    10/16/2018 Imaging   Status post distal pancreatectomy. Stable postoperative seroma along the surgical margin.   Status post hysterectomy and right oophorectomy.   Status post right hemicolectomy with appendectomy.   No evidence of recurrent or metastatic disease.    No colonic wall thickening or mass is evident on CT.   01/22/2019 Tumor Marker   Patient's tumor was tested for the following markers: CA-19.9 Results of the tumor marker test revealed 5   01/22/2019 Imaging   1. No evidence of local recurrence or metastatic disease status post distal pancreatectomy and splenectomy. 2. Stable small seroma anterior to the pancreatic head. 3. Postsurgical changes as described. 4. Stable additional incidental findings including a hepatic hemangioma, nonobstructing bilateral renal calculi and aortic Atherosclerosis (ICD10-I70.0).   04/18/2019 Imaging   1. Status post distal pancreatectomy and splenectomy. There has been a gradual increase in ill-defined soft tissue and celiac axis/SMA origin lymph nodes adjacent to the distal pancreatectomy margin and lesser curvature of the stomach/gastric body over sequential prior examinations dated 01/22/2019 and 09/26/2018. An enlarged lymph node or soft tissue nodule adjacent to the SMA origin now measures 1.8 x 0.8 cm. Findings are concerning for local recurrence of pancreatic malignancy. 2. Separately from the above findings, there remains a 1.0 cm low-attenuation nodule anterior to the remnant pancreatic neck, most consistent with postoperative seroma 3. No evidence of distant metastatic disease in the chest, abdomen, or pelvis. 4. Postoperative findings of cholecystectomy, right hemicolectomy, and hysterectomy. 5. Bilateral nonobstructive nephrolithiasis. 6.  Aortic Atherosclerosis (ICD10-I70.0).       05/28/2019 Tumor Marker   Patient's tumor was tested for the following markers: CA19-9 Results of the tumor marker test revealed 18   05/28/2019 Imaging   1. Status post distal pancreatectomy with continued further progression of the ill-defined soft tissue to the left of the celiac axis/SMA origin, now measuring 1.9 x 1.9 cm and highly concerning for recurrent/metastatic disease. This process abuts both the celiac axis,  SMA, and posterior wall of the mid stomach. PET-CT may prove helpful to further evaluate. 2. Bandlike ill-defined soft tissue in the anterior abdomen, potentially peritoneal or omental is similar to perhaps minimally increased qualitatively in the interval. Close attention on follow-up recommended. 3. Left  paraumbilical hernia contains a short segment of small bowel without complicating features. 4. Ill-defined very subtle area of decreased attenuation in the posterior aspect of hepatic segment IV. This is likely focal fatty deposition. Close attention on follow-up recommended. 5.  Aortic Atherosclerois (ICD10-170.0)   06/07/2019 Pathology Results   Malignant cells consistent with adenocarcinoma   06/07/2019 Procedure   ENDOSCOPIC FINDING (limited views with linear echoendoscope): :      The examined esophagus was endoscopically normal.      The entire examined stomach was endoscopically normal.      ENDOSONOGRAPHIC FINDING: :      1. An irregular mass was identified in the region of the pancreatic tail resection site. The mass was stellate with very poorly defined borders. Fine needle aspiration for cytology was performed. Color Doppler imaging was utilized prior to needle puncture to confirm a lack of significant vascular structures within the needle path. Four passes were made with the 25 gauge (FNB) needle using a transgastric approach. A cytotechnologist was present to evaluate the adequacy of the specimen. Final cytology results are pending. Impression:               -  Irregular, stellate soft tissue mass in the region of the pancreatic tail resection site. This was sampled with four transgastric passes with an EUS FNB needle..   06/18/2019 - 08/14/2019 Chemotherapy   The patient had FOLFIRINOX for chemotherapy treatment.     09/03/2019 Imaging   1. Findings are again highly concerning for locally recurrent disease adjacent to the pancreatectomy bed, with enlarging soft tissue mass which is  intimately associated with the superior mesenteric artery, celiac axis and superior aspect of the left renal  vein, as detailed above. 2. No evidence of metastatic disease in the thorax. 3. 2.7 x 1.2 cm cavernous hemangioma in segment 4A of the liver again noted. 4. Small nonobstructive calculi in the collecting systems of both kidneys measuring up to 3 mm in the lower pole collecting system of the left kidney. 5. Aortic atherosclerosis. 6. Additional incidental findings, as above.   11/16/2019 Imaging   Similar-appearing ill-defined soft tissue the left of the celiac axis/SMA, most compatible with recurrent/metastatic disease.   Similar ill-defined nodularity within the anterior upper abdomen, potentially peritoneal or omental. Continued attention on follow-up is recommended.   12/31/2019 Tumor Marker   Patient's tumor was tested for the following markers: CA-19-9 Results of the tumor marker test revealed 298   01/10/2020 Imaging   1. Numerous small hypoenhancing liver masses scattered throughout the liver, all new since 11/16/2019 CT, compatible with new liver metastases. 2. Stable upper retroperitoneal mass encasing the celiac axis and abutting the pancreatic resection margin, compatible with stable locally recurrent tumor. 3. Stable subcentimeter indistinct anterior upper peritoneal implants. 4. No evidence of metastatic disease in the chest. 5. Chronic findings include: Nonobstructing left nephrolithiasis. Stable small left paraumbilical hernia containing a small bowel loop without acute bowel complication. Aortic Atherosclerosis (ICD10-I70.0).     01/19/2020 -  Chemotherapy   The patient had PACLitaxel-protein bound (ABRAXANE) chemo infusion 250 mg, 125 mg/m2 = 250 mg, Intravenous,  Once, 1 of 4 cycles Administration: 250 mg (01/19/2020) ondansetron (ZOFRAN) injection 8 mg, 8 mg (100 % of original dose 8 mg), Intravenous,  Once, 1 of 4 cycles Dose modification: 8 mg (original dose 8  mg, Cycle 1) Administration: 8 mg (01/19/2020) gemcitabine (GEMZAR) 2,000 mg in sodium chloride 0.9 % 250 mL chemo infusion, 2,052 mg, Intravenous,  Once,  1 of 4 cycles Administration: 2,000 mg (01/19/2020)  for chemotherapy treatment.      REVIEW OF SYSTEMS:   Constitutional: Denies fevers, chills or abnormal weight loss Eyes: Denies blurriness of vision Respiratory: Denies cough, dyspnea or wheezes Cardiovascular: Denies palpitation, chest discomfort or lower extremity swelling Skin: Denies abnormal skin rashes Lymphatics: Denies new lymphadenopathy or easy bruising Neurological:Denies numbness, tingling or new weaknesses Behavioral/Psych: Mood is stable, no new changes  All other systems were reviewed with the patient and are negative.  I have reviewed the past medical history, past surgical history, social history and family history with the patient and they are unchanged from previous note.  ALLERGIES:  is allergic to codeine, penicillins, and bactrim [sulfamethoxazole-trimethoprim].  MEDICATIONS:  Current Outpatient Medications  Medication Sig Dispense Refill  . cetirizine (ZYRTEC) 10 MG tablet Take 10 mg by mouth daily.    . citalopram (CELEXA) 20 MG tablet TAKE 1 TABLET BY MOUTH EVERY DAY 90 tablet 4  . CREON 24000-76000 units CPEP Take 1 capsule by mouth See admin instructions. Take 1 capsule before each meal and 1 capsule after each meal, take 1 cap with snacks    . diclofenac Sodium (VOLTAREN) 1 % GEL APPLY 2-4 GRAMS TO AFFECTED JOINT 4 TIMES DAILY AS NEEDED. 400 g 1  . estradiol (ESTRACE VAGINAL) 0.1 MG/GM vaginal cream Place 1 Applicatorful vaginally 3 (three) times a week. 42.5 g 12  . fluticasone (FLONASE) 50 MCG/ACT nasal spray Place 1 spray into both nostrils daily.    Marland Kitchen gabapentin (NEURONTIN) 300 MG capsule Take 1 capsule (300 mg total) by mouth 3 (three) times daily. 90 capsule 11  . HYDROmorphone (DILAUDID) 4 MG tablet Take 1 tablet (4 mg total) by mouth every 4  (four) hours as needed. 60 tablet 0  . hydrOXYzine (ATARAX/VISTARIL) 10 MG tablet Take 1 tablet (10 mg total) by mouth 3 (three) times daily as needed. 30 tablet 1  . lidocaine-prilocaine (EMLA) cream Apply 1 application topically daily as needed (port). Apply to affected area once 30 g 11  . LORazepam (ATIVAN) 0.5 MG tablet Take 1 tablet (0.5 mg total) by mouth every 8 (eight) hours as needed for anxiety (nausea). 30 tablet 0  . metFORMIN (GLUCOPHAGE) 500 MG tablet TAKE 1 TABLET BY MOUTH TWICE A DAY WITH A MEAL 180 tablet 11  . methocarbamol (ROBAXIN) 500 MG tablet TAKE 1 TABLET BY MOUTH THREE TIMES A DAY AS NEEDED FOR MUSCLE SPASMS 90 tablet 0  . Multiple Vitamin (MULTIVITAMIN WITH MINERALS) TABS tablet Take 1 tablet by mouth daily.    Marland Kitchen omeprazole (PRILOSEC) 40 MG capsule Take 1 capsule (40 mg total) by mouth daily. 30 capsule 11  . ondansetron (ZOFRAN) 8 MG tablet TAKE 1 TAB BY MOUTH EVERY 8HOURS AS NEEDED FOR REFRACTORY NAUSEA/VOMITING START ON DAY 3AFTER CHEMO 8 tablet 7  . predniSONE (DELTASONE) 5 MG tablet TAKE 2 TABLETS BY MOUTH DAILY WITH BREAKFAST. 60 tablet 2  . prochlorperazine (COMPAZINE) 10 MG tablet TAKE 1 TABLET BY MOUTH EVERY 6 HOURS AS NEEDED 90 tablet 3  . rivaroxaban (XARELTO) 20 MG TABS tablet Take 1 tablet (20 mg total) by mouth daily with supper. 30 tablet 9  . simethicone (MYLICON) 010 MG chewable tablet Chew 125 mg by mouth every 6 (six) hours as needed for flatulence.     No current facility-administered medications for this visit.    PHYSICAL EXAMINATION: ECOG PERFORMANCE STATUS: 1 - Symptomatic but completely ambulatory  Vitals:   01/25/20 1300  BP: 123/62  Pulse: 73  Resp: 18  Temp: 98.3 F (36.8 C)  SpO2: 97%   Filed Weights   01/25/20 1300  Weight: 208 lb 3.2 oz (94.4 kg)    GENERAL:alert, no distress and comfortable SKIN: skin color, texture, turgor are normal, no rashes or significant lesions EYES: normal, Conjunctiva are pink and non-injected,  sclera clear OROPHARYNX: Noted mild mucositis.  No thrush NECK: supple, thyroid normal size, non-tender, without nodularity LYMPH:  no palpable lymphadenopathy in the cervical, axillary or inguinal LUNGS: clear to auscultation and percussion with normal breathing effort HEART: regular rate & rhythm and no murmurs and no lower extremity edema ABDOMEN:abdomen soft, non-tender and normal bowel sounds Musculoskeletal:no cyanosis of digits and no clubbing  NEURO: alert & oriented x 3 with fluent speech, no focal motor/sensory deficits  LABORATORY DATA:  I have reviewed the data as listed    Component Value Date/Time   NA 138 01/18/2020 1130   K 4.7 01/18/2020 1130   CL 104 01/18/2020 1130   CO2 22 01/18/2020 1130   GLUCOSE 105 (H) 01/18/2020 1130   BUN 15 01/18/2020 1130   CREATININE 0.64 01/18/2020 1130   CREATININE 0.68 07/27/2017 0851   CALCIUM 9.2 01/18/2020 1130   PROT 7.3 01/18/2020 1130   ALBUMIN 3.5 01/18/2020 1130   AST 45 (H) 01/18/2020 1130   ALT 48 (H) 01/18/2020 1130   ALKPHOS 94 01/18/2020 1130   BILITOT 0.3 01/18/2020 1130   GFRNONAA >60 01/18/2020 1130   GFRNONAA 106 07/27/2017 0851   GFRAA >60 11/09/2019 1012   GFRAA >60 09/27/2019 1535   GFRAA 123 07/27/2017 0851    No results found for: SPEP, UPEP  Lab Results  Component Value Date   WBC 7.3 01/25/2020   NEUTROABS 5.9 01/25/2020   HGB 10.8 (L) 01/25/2020   HCT 33.1 (L) 01/25/2020   MCV 88.3 01/25/2020   PLT 437 (H) 01/25/2020      Chemistry      Component Value Date/Time   NA 138 01/18/2020 1130   K 4.7 01/18/2020 1130   CL 104 01/18/2020 1130   CO2 22 01/18/2020 1130   BUN 15 01/18/2020 1130   CREATININE 0.64 01/18/2020 1130   CREATININE 0.68 07/27/2017 0851      Component Value Date/Time   CALCIUM 9.2 01/18/2020 1130   ALKPHOS 94 01/18/2020 1130   AST 45 (H) 01/18/2020 1130   ALT 48 (H) 01/18/2020 1130   BILITOT 0.3 01/18/2020 1130

## 2020-01-26 LAB — CANCER ANTIGEN 19-9: CA 19-9: 1104 U/mL — ABNORMAL HIGH (ref 0–35)

## 2020-01-28 ENCOUNTER — Inpatient Hospital Stay (HOSPITAL_BASED_OUTPATIENT_CLINIC_OR_DEPARTMENT_OTHER): Payer: BC Managed Care – PPO | Admitting: Hematology and Oncology

## 2020-01-28 ENCOUNTER — Encounter: Payer: Self-pay | Admitting: Hematology and Oncology

## 2020-01-28 ENCOUNTER — Other Ambulatory Visit: Payer: Self-pay

## 2020-01-28 ENCOUNTER — Inpatient Hospital Stay: Payer: BC Managed Care – PPO

## 2020-01-28 ENCOUNTER — Ambulatory Visit (HOSPITAL_COMMUNITY)
Admission: RE | Admit: 2020-01-28 | Discharge: 2020-01-28 | Disposition: A | Discharge status: Home / Self Care | Payer: BC Managed Care – PPO | Source: Ambulatory Visit | Attending: Hematology and Oncology | Admitting: Hematology and Oncology

## 2020-01-28 ENCOUNTER — Telehealth: Payer: Self-pay

## 2020-01-28 ENCOUNTER — Other Ambulatory Visit: Payer: Self-pay | Admitting: Hematology and Oncology

## 2020-01-28 VITALS — BP 112/76 | HR 105 | Temp 99.9°F | Resp 18 | Ht 62.0 in | Wt 204.4 lb

## 2020-01-28 DIAGNOSIS — R509 Fever, unspecified: Secondary | ICD-10-CM

## 2020-01-28 DIAGNOSIS — Z5111 Encounter for antineoplastic chemotherapy: Secondary | ICD-10-CM | POA: Diagnosis not present

## 2020-01-28 DIAGNOSIS — C252 Malignant neoplasm of tail of pancreas: Secondary | ICD-10-CM | POA: Diagnosis not present

## 2020-01-28 DIAGNOSIS — R748 Abnormal levels of other serum enzymes: Secondary | ICD-10-CM

## 2020-01-28 DIAGNOSIS — C7961 Secondary malignant neoplasm of right ovary: Secondary | ICD-10-CM | POA: Diagnosis not present

## 2020-01-28 DIAGNOSIS — K1231 Oral mucositis (ulcerative) due to antineoplastic therapy: Secondary | ICD-10-CM

## 2020-01-28 LAB — COMPREHENSIVE METABOLIC PANEL
ALT: 105 U/L — ABNORMAL HIGH (ref 0–44)
AST: 50 U/L — ABNORMAL HIGH (ref 15–41)
Albumin: 3.6 g/dL (ref 3.5–5.0)
Alkaline Phosphatase: 91 U/L (ref 38–126)
Anion gap: 9 (ref 5–15)
BUN: 9 mg/dL (ref 6–20)
CO2: 25 mmol/L (ref 22–32)
Calcium: 9.3 mg/dL (ref 8.9–10.3)
Chloride: 104 mmol/L (ref 98–111)
Creatinine, Ser: 0.69 mg/dL (ref 0.44–1.00)
GFR, Estimated: >60 mL/min (ref >60)
Glucose, Bld: 143 mg/dL — ABNORMAL HIGH (ref 70–99)
Potassium: 4 mmol/L (ref 3.5–5.1)
Sodium: 138 mmol/L (ref 135–145)
Total Bilirubin: 0.4 mg/dL (ref 0.3–1.2)
Total Protein: 7.8 g/dL (ref 6.5–8.1)

## 2020-01-28 LAB — URINALYSIS, COMPLETE (UACMP) WITH MICROSCOPIC
Bilirubin Urine: NEGATIVE
Glucose, UA: NEGATIVE mg/dL
Hgb urine dipstick: NEGATIVE
Ketones, ur: NEGATIVE mg/dL
Leukocytes,Ua: NEGATIVE
Nitrite: NEGATIVE
Protein, ur: NEGATIVE mg/dL
Specific Gravity, Urine: 1.012 (ref 1.005–1.030)
pH: 6 (ref 5.0–8.0)

## 2020-01-28 LAB — CBC WITH DIFFERENTIAL/PLATELET
Abs Immature Granulocytes: 0.01 10*3/uL (ref 0.00–0.07)
Basophils Absolute: 0 10*3/uL (ref 0.0–0.1)
Basophils Relative: 0 %
Eosinophils Absolute: 0.1 10*3/uL (ref 0.0–0.5)
Eosinophils Relative: 1 %
HCT: 34.9 % — ABNORMAL LOW (ref 36.0–46.0)
Hemoglobin: 11.1 g/dL — ABNORMAL LOW (ref 12.0–15.0)
Immature Granulocytes: 0 %
Lymphocytes Relative: 6 %
Lymphs Abs: 0.6 10*3/uL — ABNORMAL LOW (ref 0.7–4.0)
MCH: 28.5 pg (ref 26.0–34.0)
MCHC: 31.8 g/dL (ref 30.0–36.0)
MCV: 89.7 fL (ref 80.0–100.0)
Monocytes Absolute: 0.1 10*3/uL (ref 0.1–1.0)
Monocytes Relative: 1 %
Neutro Abs: 8 10*3/uL — ABNORMAL HIGH (ref 1.7–7.7)
Neutrophils Relative %: 92 %
Platelets: 321 10*3/uL (ref 150–400)
RBC: 3.89 MIL/uL (ref 3.87–5.11)
RDW: 15.5 % (ref 11.5–15.5)
WBC: 8.7 10*3/uL (ref 4.0–10.5)
nRBC: 0 % (ref 0.0–0.2)

## 2020-01-28 MED ORDER — DOXYCYCLINE HYCLATE 100 MG PO TABS: 100.0000 mg | ORAL_TABLET | Freq: Two times a day (BID) | ORAL | 0 refills | Status: DC

## 2020-01-28 NOTE — Assessment & Plan Note (Signed)
The cause of her fever is unknown Tumor fever is also a possibility Her recent tumor marker is grossly elevated Continue supportive care I recommend she keep her appointment as scheduled for Friday

## 2020-01-28 NOTE — Assessment & Plan Note (Addendum)
Elevated liver enzymes could also contribute to fever but unlikely to be causing fever as high as 101 Observe closely for now

## 2020-01-28 NOTE — Assessment & Plan Note (Signed)
She has no evidence of bleeding from her oral mucosa but evidence of mucositis in her mouth persists and most likely source of infection She will start antibiotics as prescribed

## 2020-01-28 NOTE — Assessment & Plan Note (Signed)
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

## 2020-01-28 NOTE — Telephone Encounter (Signed)
Called regarding fever. Temperature now 99.1 after taking tylenol. Instructed to go to radiology at [facility] ([address]) at around 1130 today. Then come to [facility] for lab at 1230 then see Dr. Alvy Bimler. She declined flush appt. Instructed to drink fluids and she need to given urine specimen with lab. She verbalized understanding.

## 2020-01-28 NOTE — Telephone Encounter (Signed)
-----   Message from Heath Lark, MD sent at 01/28/2020  9:18 AM EST ----- Regarding: fever She left a mychart msg about fever I am not going to say it's ok to just take tylenol over mychart Please offer her an appointment for labs, flush, see me today at 1 pm, 20 mins Tell her to go to get CXR before her appt, I will order

## 2020-01-28 NOTE — Progress Notes (Signed)
[place] OFFICE PROGRESS NOTE  Patient Care Team: [name], MD as PCP - General (Family Medicine)  ASSESSMENT & PLAN:  Malignant neoplasm of tail of pancreas [facility]) The cause of her fever is unknown Tumor fever is also a possibility Her recent tumor marker is grossly elevated Continue supportive care I recommend she keep her appointment as scheduled for Friday  Fever She is high risk due to ongoing chemotherapy and post splenectomy The most likely cause/source of her fever would be either tumor fever versus mucositis I recommend a course of antibiotics I will follow cultures that I have ordered today and will call her with results  Mucositis due to chemotherapy She has no evidence of bleeding from her oral mucosa but evidence of mucositis in her mouth persists and most likely source of infection She will start antibiotics as prescribed  Elevated liver enzymes Elevated liver enzymes could also contribute to fever but unlikely to be causing fever as high as 101 Observe closely for now   No orders of the defined types were placed in this encounter.   All questions were answered. The patient knows to call the clinic with any problems, questions or concerns. The total time spent in the appointment was 20 minutes encounter with patients including review of chart and various tests results, discussions about plan of care and coordination of care plan   Heath Lark, MD 01/28/2020 2:27 PM  INTERVAL HISTORY: Please see below for problem oriented charting. 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 Mixed attenuation mass emanates from the right adnexa measuring 10.8 x 8.0 cm very suspicious for right ovarian carcinoma. 2. Abnormality of the tail of the pancreas may be due to mild pancreatitis and small pseudocyst formation, but neoplasm cannot be excluded. 3. Small amount of ascites within abdomen and pelvis. 4. Small nonobstructing renal calculi bilaterally.    08/30/2017 Pathology Results   1. Uterus and cervix, with left fallopian tube - ENDOMETRIOID ADENOCARCINOMA, FIGO GRADE I, ARISING IN A BACKGROUND OF DIFFUSE COMPLEX ATYPICAL HYPERPLASIA. - CARCINOMA INVADES FOR OF DEPTH OF 0.2 CM WHERE THICKNESS OF MYOMETRIAL WALL IS 2.1 CM. - ALL RESECTION MARGINS ARE NEGATIVE FOR CARCINOMA. - NEGATIVE FOR LYMPHOVASCULAR OR PERINEURAL INVASION. - CERVICAL STROMA IS NOT INVOLVED. - SEE ONCOLOGY TABLE. - SEE NOTE 2. Ovary and fallopian tube, right - PRIMARY OVARIAN ENDOMETRIOID ADENOCARCINOMA, FIGO GRADE II, 12 CM. - THE OVARIAN SURFACE IS FOCALLY INVOLVED BY CARCINOMA. - NEGATIVE FOR LYMPHOVASCULAR INVASION. - BENIGN UNREMARKABLE FALLOPIAN TUBE, NEGATIVE FOR CARCINOMA. - SEE ONCOLOGY TABLE. - SEE NOTE 3. Cul-de-sac biopsy - METASTATIC ADENOCARCINOMA, MOST CONSISTENT WITH PRIMARY OVARIAN ENDOMETRIOID ADENOCARCINOMA. 4. Ovary, left - BENIGN UNREMARKABLE OVARY, NEGATIVE FOR MALIGNANCY. Microscopic Comment 1. UTERUS, CARCINOMA OR CARCINOSARCOMA Procedure: Total hysterectomy with bilateral salpingo-oophorectomy. Histologic type: Endometrioid adenocarcinoma. Histologic Grade: FIGO Grade I Myometrial invasion: Depth of invasion: 2 mm Myometrial thickness: 21 mm Uterine Serosa Involvement: Not identified Cervical stromal involvement: Not identified Extent of involvement of other organs: Not applicable Lymphovascular invasion: Not identified Regional  Lymph Nodes: Examined: 0 Sentinel 0 Non-sentinel 0 Total Tumor block for ancillary studies: 1H MMR / MSI testing: Pending Pathologic Stage Classification (pTNM, AJCC 8th edition): pT1a, pNX (v4.1.0.0) 2. OVARY or FALLOPIAN TUBE or PRIMARY PERITONEUM: Procedure: Salpingo-oophorectomy Specimen Integrity: Intact Tumor Site: Right ovary Ovarian Surface Involvement (required only if applicable): Focally involved by carcinoma Fallopian Tube Surface Involvement (required only if applicable): Not identified Tumor Size: 12 cm Histologic Type: Endometrioid adenocarcinoma Histologic Grade: Grade II Implants (required for advanced stage serous/seromucinous borderline tumors only): Not applicable Other Tissue/ Organ Involvement: Cul de sac biopsy involved by tumor Largest Extrapelvic Peritoneal Focus (required only if applicable): Not applicable Peritoneal/Ascitic Fluid: Negative for carcinoma (case # [id number]) Treatment Effect (required only for high-grade serous carcinomas): Not applicable Regional Lymph Nodes: No lymph nodes submitted or found Number of Lymph Nodes Examined: 0 Pathologic Stage Classification (pTNM, AJCC 8th Edition): pT2b, pN0 Representative Tumor Block: 2B and 2E 1. Molecular study for microsatellite instability and immunohistochemical stains for MMR-related proteins are pending and will be reported in an addendum. 2. Immunohistochemical stain show that the ovarian tumor is positive for CK7 and PAX8 (both diffuse), CDX2 (patchy and weak); and negative for CK20. This immunoprofile is consistent with the above diagnosis. Dr. Lyndon Code has reviewed this case and concurs with the above diagnosis. Molecular study for microsatellite instability and immunohistochemical stains for MMR-related proteins are pending and will be reported in an addendum   08/30/2017 Genetic Testing   Patient has genetic testing done for MSI  Results revealed patient has the following mutation(s): loss of [facility]  2   08/30/2017 Surgery   Surgeon: Mart Piggs, MD Pre-operative Diagnosis:  1. Adnexal mass 2. Abnormal uterine bleeding 3. H/o Cecal CA  Post-operative Diagnosis:  1. Adhesive disease post colon resection 2. Endometrial cancer NOS 3. Adenocarcinoma unknown origin, right ovary, suspicious for GI primary  Operation:  1. Lysis of adhesions ~30 minutes 2. Robotic-assisted laparoscopic total hysterectomy with right salpingo-oophorectomy and left salpingectomy 3. Left oophorectomy (RA-laparoscopic) 4. Pelvic washings  Findings: Adhesions of omentum to anterior abdominal wall. Enlarged cystic right ovary ~10cm. Uterus had small nodules on serosa near where right adnexa was intimate with the surface. No obvious intraoperative rupture of cyst, although in 2 areas the wall was thin and one of these areas had some bleeding. Slight scarring of left bladder dome to LUS/cervix. Uterus on frozen section c/w hyperplasia and a small focus of endometrial CA - no myo invasion, <2cm in size. Frozen section on the right adnexa was carcinoma, met from colon or possibly Gyn, favor GI primary, defer to permanent. Left ovary was WNL.     09/15/2017 Cancer Staging   Staging form: Corpus Uteri - Carcinoma and Carcinosarcoma, AJCC 8th Edition - Pathologic: FIGO Stage IA (pT1a, pN0, cM0) - Signed by Heath Lark, MD on 09/15/2017   09/26/2017 Procedure   Successful placement of a right internal jugular approach power injectable Port-A-Cath. The catheter is ready for immediate use.   11/07/2017 Imaging   1. Since 08/18/2017, similar to slight decrease in size of a pancreatic body/tail junction lesion. Cross modality comparison relative to 09/16/2017 MRI is also grossly similar. 2. No evidence of metastatic disease. 3. Aortic Atherosclerosis (ICD10-I70.0).  4. Left nephrolithiasis.   11/18/2017 Genetic Testing   MSH2 c.2237dupT pathogenic mutation identified in the CancerNext panel.  The CancerNext gene  panel offered by [employer] includes sequencing and rearrangement analysis for the following 34 genes:   APC, ATM, BARD1, BMPR1A,  BRCA1, BRCA2, BRIP1, CDH1, CDK4, CDKN2A, CHEK2, DICER1, HOXB13, EPCAM, GREM1, MLH1, MRE11A, MSH2, MSH6, MUTYH, NBN, NF1, PALB2, PMS2, POLD1, POLE, PTEN, RAD50, RAD51C, RAD51D, SMAD4, SMARCA4, STK11, and TP53.  The report date is November 18, 2017.  MSH2 [id number] pathogenic mutation identified on somatic testing.  These results are consistent with a diagnosis of Lynch syndrome.   06/18/2019 - 08/14/2019 Chemotherapy   The patient had FOLFIRINOX for chemotherapy treatment.     01/19/2020 -  Chemotherapy   The patient had PACLitaxel-protein bound (ABRAXANE) chemo infusion 250 mg, 125 mg/m2 = 250 mg, Intravenous,  Once, 1 of 4 cycles Administration: 250 mg (01/19/2020), 250 mg (01/25/2020) ondansetron (ZOFRAN) injection 8 mg, 8 mg (100 % of original dose 8 mg), Intravenous,  Once, 1 of 4 cycles Dose modification: 8 mg (original dose 8 mg, Cycle 1) Administration: 8 mg (01/19/2020), 8 mg (01/25/2020) gemcitabine (GEMZAR) 2,000 mg in sodium chloride 0.9 % 250 mL chemo infusion, 2,052 mg, Intravenous,  Once, 1 of 4 cycles Administration: 2,000 mg (01/19/2020), 2,000 mg (01/25/2020)  for chemotherapy treatment.    Secondary malignant neoplasm of right ovary ([place])  08/23/2017 Tumor Marker   Patient's tumor was tested for the following markers: CA-125 Results of the tumor marker test revealed 139.8   08/31/2017 Initial Diagnosis   Secondary malignant neoplasm of right ovary ([place])   09/15/2017 Cancer Staging   Staging form: Ovary, Fallopian Tube, and Primary Peritoneal Carcinoma, AJCC 8th Edition - Pathologic: Stage IIB (pT2b, pN0, cM0) - Signed by Heath Lark, MD on 09/15/2017   09/27/2017 Imaging   No evidence of metastatic disease or other acute findings within the thorax.  4 cm low-attenuation mass in pancreatic tail, highly suspicious for pancreatic  carcinoma. This is caused splenic vein thrombosis, with new venous collaterals in the left upper quadrant. Consider endoscopic ultrasound with FNA for tissue diagnosis.  Stable benign hepatic hemangioma.    09/27/2017 Tumor Marker   Patient's tumor was tested for the following markers: CA-125 Results of the tumor marker test revealed 39.4   11/02/2017 Tumor Marker   Patient's tumor was tested for the following markers: CA-125 Results of the tumor marker test revealed 19   11/07/2017 Tumor Marker   Patient's tumor was tested for the following markers: CA-125 Results of the tumor marker test revealed 18.3   11/18/2017 Genetic Testing   MSH2 c.2237dupT pathogenic mutation identified in the CancerNext panel.  The CancerNext gene panel offered by [employer] includes sequencing and rearrangement analysis for the following 34 genes:   APC, ATM, BARD1, BMPR1A, BRCA1, BRCA2, BRIP1, CDH1, CDK4, CDKN2A, CHEK2, DICER1, HOXB13, EPCAM, GREM1, MLH1, MRE11A, MSH2, MSH6, MUTYH, NBN, NF1, PALB2, PMS2, POLD1, POLE, PTEN, RAD50, RAD51C, RAD51D, SMAD4, SMARCA4, STK11, and TP53.  The report date is November 18, 2017.  MSH2 [id number] pathogenic mutation identified on somatic testing.  These results are consistent with a diagnosis of Lynch syndrome.   12/15/2017 Tumor Marker   Patient's tumor was tested for the following markers: CA-125 Results of the tumor marker test revealed 57.3   01/16/2018 Tumor Marker   Patient's tumor was tested for the following markers: CA-125 Results of the tumor marker test revealed 24.2   04/10/2018 Tumor Marker   Patient's tumor was tested for the following markers: CA-125 Results of the tumor marker test revealed 18.2   07/12/2018 Tumor Marker   Patient's tumor was tested for the following markers: CA-125 Results of the tumor marker test revealed 11.8   10/16/2018 Tumor Marker  Patient's tumor was tested for the following markers: CA125 Results of the  tumor marker test revealed 12.4.   01/18/2020 Tumor Marker   Patient's tumor was tested for the following markers: CA-125 Results of the tumor marker test revealed 18   Malignant neoplasm of tail of pancreas ([place])  10/13/2017 Pathology Results   Pancreas tail mass, endoscopic ultrasound-guided, fine needle aspiration II (smears and cell block): Adenocarcinoma   11/18/2017 Genetic Testing   MSH2 c.2237dupT pathogenic mutation identified in the CancerNext panel.  The CancerNext gene panel offered by [employer] includes sequencing and rearrangement analysis for the following 34 genes:   APC, ATM, BARD1, BMPR1A, BRCA1, BRCA2, BRIP1, CDH1, CDK4, CDKN2A, CHEK2, DICER1, HOXB13, EPCAM, GREM1, MLH1, MRE11A, MSH2, MSH6, MUTYH, NBN, NF1, PALB2, PMS2, POLD1, POLE, PTEN, RAD50, RAD51C, RAD51D, SMAD4, SMARCA4, STK11, and TP53.  The report date is November 18, 2017.  MSH2 [id number] pathogenic mutation identified on somatic testing.  These results are consistent with a diagnosis of Lynch syndrome.   11/24/2017 Pathology Results   A. "TAIL OF PANCREAS AND SPLEEN", DISTAL PANCREATECTOMY AND SPLENECTOMY: Invasive ductal adenocarcinoma, moderately to poorly differentiated with focal signet ring cell features, of pancreas (distal).   The carcinoma is 2.5 cm in greatest dimension grossly.   Treatment effects present in the form of fibrosis (50%). No lymphovascular or definite perineural invasion identified. All surgical margins are negative for tumor or high-grade dysplasia. Adjacent mucinous neoplasm, most consistent with Intraductal papillary mucinous neoplasm (IPMN) with low-grade dysplasia.   Uninvolved pancreas show atrophy and focal acute inflammation.   Twelve benign lymph nodes (0/12). Spleen with no significant histopathologic abnormalities.  PROCEDURE: distal pancreatectomy and splenectomy TUMOR SITE: distal pancreas TUMOR  SIZE:  GREATEST DIMENSION: 2.5 cm  ADDITIONAL DIMENSIONS:  x  cm HISTOLOGIC TYPE: ductal adenocarcinoma HISTOLOGIC GRADE: grade 3 TUMOR EXTENSION: peripancreatic soft tissue MARGINS: negative for tumor TREATMENT EFFECT: treatment effects present in the form of fibrosis (50%). LYMPHOVASCULAR INVASION: not identified PERINEURAL INVASION: no definite evidence REGIONAL LYMPH NODES:   NUMBER OF LYMPH NODES INVOLVED: 0   NUMBER OF LYMPH NODES EXAMINED: 12 PATHOLOGIC STAGE CLASSIFICATION (pTNM, AJCC 8th Ed): ypT2, ypN0 DISTANT METASTASIS (pM): pMx ADDITIONAL PATHOLOGIC FINDINGS: mucinous neoplasm, most consistent with intraductal papillary mucinous neoplasm (IPMN) with low-grade dysplasia, is identified adjacent to the main tumor.   11/24/2017 Cancer Staging   Staging form: Exocrine Pancreas, AJCC 8th Edition - Pathologic stage from 11/24/2017: Stage IB (pT2, pN0, cM0) - Signed by Truitt Merle, MD on 01/03/2018   11/25/2017 Surgery   She had surgery at [facility] 1. Exploratory Laparotomy 2. Distal Pancreatectomy and Splenectomy 3. Intraoperative Ultrasound 4. Open Cholecystectomy    12/15/2017 Cancer Staging   Staging form: Exocrine Pancreas, AJCC 8th Edition - Clinical: Stage IB (cT2, cN0, cM0) - Signed by Heath Lark, MD on 12/15/2017   12/28/2017 Imaging   12/28/2017 CT Abdomen  IMPRESSION: 1. Postoperative findings from recent partial pancreatectomy including a 21 cubic cm fluid collection along the pancreatic resection margin which could represent early pseudocyst. 2. Nodularity along the lateral limb of the left adrenal gland could also be postoperative but merit surveillance, as the pancreatic lesion was in close proximity to this adrenal gland on the prior CT of 11/07/2017. 3. Asymmetric fullness inferiorly in the left breast. The patient has a history of prior left breast procedures, correlation with mammography is recommended. 4. Other imaging findings of potential  clinical significance: Stable hemangioma in the left hepatic lobe. Splenectomy. Aortic Atherosclerosis (ICD10-I70.0). Right  hemicolectomy. Small focus of fat necrosis in the right anterior abdominal wall subcutaneous tissues near the laparotomy site. Bilateral nonobstructive nephrolithiasis.   01/02/2018 Tumor Marker   Patient's tumor was tested for the following markers: CA-19-9 Results of the tumor marker test revealed 5   01/03/2018 - 06/05/2018 Chemotherapy   She received modified dose FOLFIRINOX   04/10/2018 Imaging   1. Distal pancreatectomy, without findings of recurrent or metastatic disease. 2. Incompletely imaged hypoenhancement within lower lobe right pulmonary artery branch is likely chronic (but interval since 10/09/2017) pulmonary embolism. Dedicated CTA could further evaluate. 3. Decrease in size of a peripancreatic complex fluid collection anteriorly, likely a resolving pseudocyst. 4. Decreased size of minimal fluid versus a borderline sized node in the gastrohepatic ligament. Recommend attention on follow-up. 5. Hepatic steatosis with a segment 4 hemangioma. 6. Aortic Atherosclerosis (ICD10-I70.0). This is significantly age advanced.   07/12/2018 Tumor Marker   Patient's tumor was tested for the following markers: CA-19-9 Results of the tumor marker test revealed 6   07/12/2018 Imaging   1. Status post distal pancreatectomy with stable postoperative fluid collection adjacent to the ventral aspect of the pancreatic head. No findings to suggest metastatic disease in the abdomen or pelvis. 2. Hepatic steatosis with small cavernous hemangioma in segment 4A of the liver. 3. Slight decreased size of nonenlarged gastrohepatic ligament lymph node, presumably benign. 4. Aortic atherosclerosis.    10/16/2018 Imaging   Status post distal pancreatectomy. Stable postoperative seroma along the surgical margin.   Status post hysterectomy and right oophorectomy.   Status post right  hemicolectomy with appendectomy.   No evidence of recurrent or metastatic disease.   No colonic wall thickening or mass is evident on CT.   01/22/2019 Tumor Marker   Patient's tumor was tested for the following markers: CA-19.9 Results of the tumor marker test revealed 5   01/22/2019 Imaging   1. No evidence of local recurrence or metastatic disease status post distal pancreatectomy and splenectomy. 2. Stable small seroma anterior to the pancreatic head. 3. Postsurgical changes as described. 4. Stable additional incidental findings including a hepatic hemangioma, nonobstructing bilateral renal calculi and aortic Atherosclerosis (ICD10-I70.0).   04/18/2019 Imaging   1. Status post distal pancreatectomy and splenectomy. There has been a gradual increase in ill-defined soft tissue and celiac axis/SMA origin lymph nodes adjacent to the distal pancreatectomy margin and lesser curvature of the stomach/gastric body over sequential prior examinations dated 01/22/2019 and 09/26/2018. An enlarged lymph node or soft tissue nodule adjacent to the SMA origin now measures 1.8 x 0.8 cm. Findings are concerning for local recurrence of pancreatic malignancy. 2. Separately from the above findings, there remains a 1.0 cm low-attenuation nodule anterior to the remnant pancreatic neck, most consistent with postoperative seroma 3. No evidence of distant metastatic disease in the chest, abdomen, or pelvis. 4. Postoperative findings of cholecystectomy, right hemicolectomy, and hysterectomy. 5. Bilateral nonobstructive nephrolithiasis. 6.  Aortic Atherosclerosis (ICD10-I70.0).       05/28/2019 Tumor Marker   Patient's tumor was tested for the following markers: CA19-9 Results of the tumor marker test revealed 18   05/28/2019 Imaging   1. Status post distal pancreatectomy with continued further progression of the ill-defined soft tissue to the left of the celiac axis/SMA origin, now measuring 1.9 x 1.9 cm and  highly concerning for recurrent/metastatic disease. This process abuts both the celiac axis, SMA, and posterior wall of the mid stomach. PET-CT may prove helpful to further evaluate. 2. Bandlike ill-defined soft tissue in  the anterior abdomen, potentially peritoneal or omental is similar to perhaps minimally increased qualitatively in the interval. Close attention on follow-up recommended. 3. Left paraumbilical hernia contains a short segment of small bowel without complicating features. 4. Ill-defined very subtle area of decreased attenuation in the posterior aspect of hepatic segment IV. This is likely focal fatty deposition. Close attention on follow-up recommended. 5.  Aortic Atherosclerois (ICD10-170.0)   06/07/2019 Pathology Results   Malignant cells consistent with adenocarcinoma   06/07/2019 Procedure   ENDOSCOPIC FINDING (limited views with linear echoendoscope): :      The examined esophagus was endoscopically normal.      The entire examined stomach was endoscopically normal.      ENDOSONOGRAPHIC FINDING: :      1. An irregular mass was identified in the region of the pancreatic tail resection site. The mass was stellate with very poorly defined borders. Fine needle aspiration for cytology was performed. Color Doppler imaging was utilized prior to needle puncture to confirm a lack of significant vascular structures within the needle path. Four passes were made with the 25 gauge (FNB) needle using a transgastric approach. A cytotechnologist was present to evaluate the adequacy of the specimen. Final cytology results are pending. Impression:               -  Irregular, stellate soft tissue mass in the region of the pancreatic tail resection site. This was sampled with four transgastric passes with an EUS FNB needle..   06/18/2019 - 08/14/2019 Chemotherapy   The patient had FOLFIRINOX for chemotherapy treatment.     09/03/2019 Imaging   1. Findings are again highly concerning for locally  recurrent disease adjacent to the pancreatectomy bed, with enlarging soft tissue mass which is intimately associated with the superior mesenteric artery, celiac axis and superior aspect of the left renal  vein, as detailed above. 2. No evidence of metastatic disease in the thorax. 3. 2.7 x 1.2 cm cavernous hemangioma in segment 4A of the liver again noted. 4. Small nonobstructive calculi in the collecting systems of both kidneys measuring up to 3 mm in the lower pole collecting system of the left kidney. 5. Aortic atherosclerosis. 6. Additional incidental findings, as above.   11/16/2019 Imaging   Similar-appearing ill-defined soft tissue the left of the celiac axis/SMA, most compatible with recurrent/metastatic disease.   Similar ill-defined nodularity within the anterior upper abdomen, potentially peritoneal or omental. Continued attention on follow-up is recommended.   12/31/2019 Tumor Marker   Patient's tumor was tested for the following markers: CA-19-9 Results of the tumor marker test revealed 298   01/10/2020 Imaging   1. Numerous small hypoenhancing liver masses scattered throughout the liver, all new since 11/16/2019 CT, compatible with new liver metastases. 2. Stable upper retroperitoneal mass encasing the celiac axis and abutting the pancreatic resection margin, compatible with stable locally recurrent tumor. 3. Stable subcentimeter indistinct anterior upper peritoneal implants. 4. No evidence of metastatic disease in the chest. 5. Chronic findings include: Nonobstructing left nephrolithiasis. Stable small left paraumbilical hernia containing a small bowel loop without acute bowel complication. Aortic Atherosclerosis (ICD10-I70.0).     01/19/2020 -  Chemotherapy   The patient had PACLitaxel-protein bound (ABRAXANE) chemo infusion 250 mg, 125 mg/m2 = 250 mg, Intravenous,  Once, 1 of 4 cycles Administration: 250 mg (01/19/2020), 250 mg (01/25/2020) ondansetron (ZOFRAN) injection 8  mg, 8 mg (100 % of original dose 8 mg), Intravenous,  Once, 1 of 4 cycles Dose modification: 8 mg (original  dose 8 mg, Cycle 1) Administration: 8 mg (01/19/2020), 8 mg (01/25/2020) gemcitabine (GEMZAR) 2,000 mg in sodium chloride 0.9 % 250 mL chemo infusion, 2,052 mg, Intravenous,  Once, 1 of 4 cycles Administration: 2,000 mg (01/19/2020), 2,000 mg (01/25/2020)  for chemotherapy treatment.    01/25/2020 Tumor Marker   Patient's tumor was tested for the following markers: CA-19-9 Results of the tumor marker test revealed 1104.     REVIEW OF SYSTEMS:   Eyes: Denies blurriness of vision Respiratory: Denies cough, dyspnea or wheezes Cardiovascular: Denies palpitation, chest discomfort or lower extremity swelling Gastrointestinal:  Denies nausea, heartburn or change in bowel habits Skin: Denies abnormal skin rashes Lymphatics: Denies new lymphadenopathy or easy bruising Neurological:Denies numbness, tingling or new weaknesses Behavioral/Psych: Mood is stable, no new changes  All other systems were reviewed with the patient and are negative.  I have reviewed the past medical history, past surgical history, social history and family history with the patient and they are unchanged from previous note.  ALLERGIES:  is allergic to codeine, penicillins, and bactrim [sulfamethoxazole-trimethoprim].  MEDICATIONS:  Current Outpatient Medications  Medication Sig Dispense Refill  . cetirizine (ZYRTEC) 10 MG tablet Take 10 mg by mouth daily.    . citalopram (CELEXA) 20 MG tablet TAKE 1 TABLET BY MOUTH EVERY DAY 90 tablet 4  . CREON 24000-76000 units CPEP Take 1 capsule by mouth See admin instructions. Take 1 capsule before each meal and 1 capsule after each meal, take 1 cap with snacks    . diclofenac Sodium (VOLTAREN) 1 % GEL APPLY 2-4 GRAMS TO AFFECTED JOINT 4 TIMES DAILY AS NEEDED. 400 g 1  . doxycycline (VIBRA-TABS) 100 MG tablet Take 1 tablet (100 mg total) by mouth 2 (two) times daily. 14 tablet  0  . estradiol (ESTRACE VAGINAL) 0.1 MG/GM vaginal cream Place 1 Applicatorful vaginally 3 (three) times a week. 42.5 g 12  . fluticasone (FLONASE) 50 MCG/ACT nasal spray Place 1 spray into both nostrils daily.    Marland Kitchen gabapentin (NEURONTIN) 300 MG capsule Take 1 capsule (300 mg total) by mouth 3 (three) times daily. 90 capsule 11  . HYDROmorphone (DILAUDID) 4 MG tablet Take 1 tablet (4 mg total) by mouth every 4 (four) hours as needed. 60 tablet 0  . hydrOXYzine (ATARAX/VISTARIL) 10 MG tablet Take 1 tablet (10 mg total) by mouth 3 (three) times daily as needed. 30 tablet 1  . lidocaine-prilocaine (EMLA) cream Apply 1 application topically daily as needed (port). Apply to affected area once 30 g 11  . LORazepam (ATIVAN) 0.5 MG tablet Take 1 tablet (0.5 mg total) by mouth every 8 (eight) hours as needed for anxiety (nausea). 30 tablet 0  . magic mouthwash w/lidocaine SOLN Take 5 mLs by mouth 4 (four) times daily for 7 days. 5 ml QID (Swish and spit) X 7 days 240 mL 0  . metFORMIN (GLUCOPHAGE) 500 MG tablet TAKE 1 TABLET BY MOUTH TWICE A DAY WITH A MEAL 180 tablet 11  . methocarbamol (ROBAXIN) 500 MG tablet TAKE 1 TABLET BY MOUTH THREE TIMES A DAY AS NEEDED FOR MUSCLE SPASMS 90 tablet 0  . Multiple Vitamin (MULTIVITAMIN WITH MINERALS) TABS tablet Take 1 tablet by mouth daily.    Marland Kitchen omeprazole (PRILOSEC) 40 MG capsule Take 1 capsule (40 mg total) by mouth daily. 30 capsule 11  . ondansetron (ZOFRAN) 8 MG tablet TAKE 1 TAB BY MOUTH EVERY 8HOURS AS NEEDED FOR REFRACTORY NAUSEA/VOMITING START ON DAY 3AFTER CHEMO 8 tablet 7  .  predniSONE (DELTASONE) 5 MG tablet TAKE 2 TABLETS BY MOUTH DAILY WITH BREAKFAST. 60 tablet 2  . prochlorperazine (COMPAZINE) 10 MG tablet TAKE 1 TABLET BY MOUTH EVERY 6 HOURS AS NEEDED 90 tablet 3  . rivaroxaban (XARELTO) 20 MG TABS tablet Take 1 tablet (20 mg total) by mouth daily with supper. 30 tablet 9  . simethicone (MYLICON) 387 MG chewable tablet Chew 125 mg by mouth every 6 (six)  hours as needed for flatulence.     No current facility-administered medications for this visit.    PHYSICAL EXAMINATION: ECOG PERFORMANCE STATUS: 1 - Symptomatic but completely ambulatory  Vitals:   01/28/20 1258  BP: 112/76  Pulse: (!) 105  Resp: 18  Temp: 99.9 F (37.7 C)  SpO2: 98%   Filed Weights   01/28/20 1258  Weight: 204 lb 6.4 oz (92.7 kg)    GENERAL:alert, no distress and comfortable SKIN: skin color, texture, turgor are normal, no rashes or significant lesions EYES: normal, Conjunctiva are pink and non-injected, sclera clear OROPHARYNX: Noted mucositis.  No thrush NECK: supple, thyroid normal size, non-tender, without nodularity LYMPH:  no palpable lymphadenopathy in the cervical, axillary or inguinal LUNGS: clear to auscultation and percussion with normal breathing effort HEART: regular rate & rhythm and no murmurs and no lower extremity edema ABDOMEN:abdomen soft, non-tender and normal bowel sounds Musculoskeletal:no cyanosis of digits and no clubbing  NEURO: alert & oriented x 3 with fluent speech, no focal motor/sensory deficits  LABORATORY DATA:  I have reviewed the data as listed    Component Value Date/Time   NA 138 01/28/2020 1227   K 4.0 01/28/2020 1227   CL 104 01/28/2020 1227   CO2 25 01/28/2020 1227   GLUCOSE 143 (H) 01/28/2020 1227   BUN 9 01/28/2020 1227   CREATININE 0.69 01/28/2020 1227   CREATININE 0.64 01/25/2020 1245   CREATININE 0.68 07/27/2017 0851   CALCIUM 9.3 01/28/2020 1227   PROT 7.8 01/28/2020 1227   ALBUMIN 3.6 01/28/2020 1227   AST 50 (H) 01/28/2020 1227   AST 30 01/25/2020 1245   ALT 105 (H) 01/28/2020 1227   ALT 44 01/25/2020 1245   ALKPHOS 91 01/28/2020 1227   BILITOT 0.4 01/28/2020 1227   BILITOT <0.2 (L) 01/25/2020 1245   GFRNONAA >60 01/28/2020 1227   GFRNONAA >60 01/25/2020 1245   GFRNONAA 106 07/27/2017 0851   GFRAA >60 11/09/2019 1012   GFRAA >60 09/27/2019 1535   GFRAA 123 07/27/2017 0851    No results  found for: SPEP, UPEP  Lab Results  Component Value Date   WBC 8.7 01/28/2020   NEUTROABS 8.0 (H) 01/28/2020   HGB 11.1 (L) 01/28/2020   HCT 34.9 (L) 01/28/2020   MCV 89.7 01/28/2020   PLT 321 01/28/2020      Chemistry      Component Value Date/Time   NA 138 01/28/2020 1227   K 4.0 01/28/2020 1227   CL 104 01/28/2020 1227   CO2 25 01/28/2020 1227   BUN 9 01/28/2020 1227   CREATININE 0.69 01/28/2020 1227   CREATININE 0.64 01/25/2020 1245   CREATININE 0.68 07/27/2017 0851      Component Value Date/Time   CALCIUM 9.3 01/28/2020 1227   ALKPHOS 91 01/28/2020 1227   AST 50 (H) 01/28/2020 1227   AST 30 01/25/2020 1245   ALT 105 (H) 01/28/2020 1227   ALT 44 01/25/2020 1245   BILITOT 0.4 01/28/2020 1227   BILITOT <0.2 (L) 01/25/2020 1245       RADIOGRAPHIC  STUDIES: I have personally reviewed the radiological images as listed and agreed with the findings in the report. DG Chest 2 View  Result Date: 01/28/2020 CLINICAL DATA:  Fever.  No chest complaints. EXAM: CHEST - 2 VIEW COMPARISON:  CT chest dated January 10, 2020. FINDINGS: Unchanged right chest wall port catheter. The heart size and mediastinal contours are within normal limits. Normal pulmonary vascularity. No focal consolidation, pleural effusion, or pneumothorax. No acute osseous abnormality. IMPRESSION: No active cardiopulmonary disease. Electronically Signed   By: Titus Dubin M.D.   On: 01/28/2020 12:25   CT CHEST ABDOMEN PELVIS W CONTRAST  Result Date: 01/10/2020 CLINICAL DATA:  Lynch syndrome. History of endometrial and right ovarian cancer status post [facility] 08/30/2017. 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 Adjuvant chemotherapy. Pancreatic resection margin recurrence 06/07/2019 with subsequent chemotherapy. Restaging. EXAM: CT CHEST, ABDOMEN, AND PELVIS WITH CONTRAST TECHNIQUE: Multidetector CT imaging of the chest, abdomen and pelvis was  performed following the standard protocol during bolus administration of intravenous contrast. CONTRAST:  156m OMNIPAQUE IOHEXOL 300 MG/ML  SOLN COMPARISON:  11/16/2019 CT abdomen/pelvis. 09/03/2019 CT chest, abdomen and pelvis. FINDINGS: CT CHEST FINDINGS Cardiovascular: Normal heart size. No significant pericardial effusion/thickening. Right internal jugular Port-A-Cath terminates in the right atrium. Great vessels are normal in course and caliber. No central pulmonary emboli. Mediastinum/Nodes: No discrete thyroid nodules. Unremarkable esophagus. No pathologically enlarged axillary, mediastinal or hilar lymph nodes. Lungs/Pleura: No pneumothorax. No pleural effusion. No acute consolidative airspace disease, lung masses or significant pulmonary nodules. Musculoskeletal: No aggressive appearing focal osseous lesions. Minimal thoracic spondylosis. CT ABDOMEN PELVIS FINDINGS Hepatobiliary: Numerous (greater than 15) small hypoenhancing liver masses scattered throughout the liver, largest 1.5 cm in the segment 6 right liver lobe (series 7/image 85), 0.9 cm in inferior segment 5 right liver lobe (series 7/image 97) and 0.9 cm in the segment 8 right liver lobe (series 7/image 66), all new since 11/16/2019 CT. Stable 2.4 cm segment 4A left liver lobe hemangioma (series 7/image 63). Cholecystectomy. No biliary ductal dilatation. Pancreas: Status post pancreatic tail resection. Ill-defined 3.6 x 2.5 cm upper retroperitoneal mass (series 7/image 92) encasing celiac axis and abutting pancreatic resection margin, previously 3.6 x 2.4 cm using similar measurement technique, unchanged. No pancreatic duct dilation in the remnant pancreas. Spleen: Splenectomy. Adrenals/Urinary Tract: Normal adrenals. Nonobstructing 3 mm lower left renal stone. Otherwise normal kidneys, with no hydronephrosis. Normal bladder. Stomach/Bowel: Normal non-distended stomach. Stable small left paraumbilical hernia containing a small bowel loop with no  small bowel wall thickening, pneumatosis or dilation. Oral contrast transits to the distal small bowel. Status post partial right hemicolectomy with intact appearing ileocolic anastomosis in the right abdomen. No colonic wall thickening, diverticulosis or acute pericolonic fat stranding in the remnant large-bowel. Vascular/Lymphatic: Atherosclerotic nonaneurysmal abdominal aorta. Patent portal, hepatic and renal veins. No pathologically enlarged lymph nodes in the abdomen or pelvis. Reproductive: Status post hysterectomy, with no abnormal findings at the vaginal cuff. No adnexal mass. Other: No pneumoperitoneum, ascites or focal fluid collection. A few scattered small indistinct soft tissue nodules in the anterior upper peritoneal cavity bilaterally, largest 0.6 cm near the midline (series 7/image 82), previously 0.6 cm using similar measurement technique, all unchanged. No appreciable new peritoneal implants. Musculoskeletal: No aggressive appearing focal osseous lesions. IMPRESSION: 1. Numerous small hypoenhancing liver masses scattered throughout the liver, all new since 11/16/2019 CT, compatible with new liver metastases. 2. Stable upper retroperitoneal mass encasing the celiac axis and abutting the pancreatic resection  margin, compatible with stable locally recurrent tumor. 3. Stable subcentimeter indistinct anterior upper peritoneal implants. 4. No evidence of metastatic disease in the chest. 5. Chronic findings include: Nonobstructing left nephrolithiasis. Stable small left paraumbilical hernia containing a small bowel loop without acute bowel complication. Aortic Atherosclerosis (ICD10-I70.0). Electronically Signed   By: Ilona Sorrel M.D.   On: 01/10/2020 14:20   MM 3D SCREEN BREAST BILATERAL  Result Date: 01/08/2020 CLINICAL DATA:  Screening. EXAM: DIGITAL SCREENING BILATERAL MAMMOGRAM WITH TOMO AND CAD COMPARISON:  Previous exam(s). ACR Breast Density Category b: There are scattered areas of  fibroglandular density. FINDINGS: There are no findings suspicious for malignancy. Port-A-Cath port overlies the low RIGHT axilla on the MLO images. Images were processed with CAD. IMPRESSION: No mammographic evidence of malignancy. A result letter of this screening mammogram will be mailed directly to the patient. RECOMMENDATION: Screening mammogram in one year. (Code:[id number]) BI-RADS CATEGORY  1: Negative. Electronically Signed   By: Evangeline Dakin M.D.   On: 01/08/2020 09:21

## 2020-01-29 LAB — URINE CULTURE: Culture: <10000 — AB

## 2020-01-30 ENCOUNTER — Other Ambulatory Visit: Payer: Self-pay | Admitting: Hematology and Oncology

## 2020-01-30 ENCOUNTER — Telehealth: Payer: Self-pay

## 2020-01-30 NOTE — Telephone Encounter (Signed)
Called and given below message. She verbalized understanding. She is able to eat and drink with no problems. She is feeling some better. She is still having a low grade fever, last night it was 100.2. She is using the magic mouth wash for the mouth sores. She developed a crack in the side of her mouth yesterday. She is mouth breathing at night and thinks she may be drooling at night. She has some yeast cream she started applying yesterday to the crack. Still complaining of nasal congestion and she is using sudafed 12 hour during the day and nasal saline spray.  She said no need to call back unless Dr. Alvy Bimler has further instructions.

## 2020-01-30 NOTE — Telephone Encounter (Signed)
-----   Message from Heath Lark, MD sent at 01/30/2020  7:58 AM EST ----- Regarding: how is she Can youc all and ask how she is doing? All cultures are negative

## 2020-02-01 ENCOUNTER — Inpatient Hospital Stay: Payer: BC Managed Care – PPO

## 2020-02-01 ENCOUNTER — Inpatient Hospital Stay (HOSPITAL_BASED_OUTPATIENT_CLINIC_OR_DEPARTMENT_OTHER): Payer: BC Managed Care – PPO | Admitting: Medical

## 2020-02-01 ENCOUNTER — Ambulatory Visit: Payer: BC Managed Care – PPO | Admitting: Hematology and Oncology

## 2020-02-01 ENCOUNTER — Other Ambulatory Visit: Payer: Self-pay

## 2020-02-01 ENCOUNTER — Other Ambulatory Visit: Payer: Self-pay | Admitting: Medical

## 2020-02-01 VITALS — BP 130/85 | HR 68 | Temp 98.4°F | Resp 18 | Wt 206.8 lb

## 2020-02-01 DIAGNOSIS — Z7189 Other specified counseling: Secondary | ICD-10-CM

## 2020-02-01 DIAGNOSIS — C7961 Secondary malignant neoplasm of right ovary: Secondary | ICD-10-CM | POA: Diagnosis not present

## 2020-02-01 DIAGNOSIS — C252 Malignant neoplasm of tail of pancreas: Secondary | ICD-10-CM

## 2020-02-01 DIAGNOSIS — C541 Malignant neoplasm of endometrium: Secondary | ICD-10-CM

## 2020-02-01 DIAGNOSIS — B370 Candidal stomatitis: Secondary | ICD-10-CM

## 2020-02-01 DIAGNOSIS — Z5111 Encounter for antineoplastic chemotherapy: Secondary | ICD-10-CM | POA: Diagnosis not present

## 2020-02-01 DIAGNOSIS — K1230 Oral mucositis (ulcerative), unspecified: Secondary | ICD-10-CM

## 2020-02-01 LAB — CMP (CANCER CENTER ONLY)
ALT: 55 U/L — ABNORMAL HIGH (ref 0–44)
AST: 29 U/L (ref 15–41)
Albumin: 3.5 g/dL (ref 3.5–5.0)
Alkaline Phosphatase: 97 U/L (ref 38–126)
Anion gap: 9 (ref 5–15)
BUN: 11 mg/dL (ref 6–20)
CO2: 24 mmol/L (ref 22–32)
Calcium: 9.1 mg/dL (ref 8.9–10.3)
Chloride: 105 mmol/L (ref 98–111)
Creatinine: 0.74 mg/dL (ref 0.44–1.00)
GFR, Estimated: >60 mL/min (ref >60)
Glucose, Bld: 107 mg/dL — ABNORMAL HIGH (ref 70–99)
Potassium: 4.1 mmol/L (ref 3.5–5.1)
Sodium: 138 mmol/L (ref 135–145)
Total Bilirubin: <0.2 mg/dL — ABNORMAL LOW (ref 0.3–1.2)
Total Protein: 7.4 g/dL (ref 6.5–8.1)

## 2020-02-01 LAB — CBC WITH DIFFERENTIAL (CANCER CENTER ONLY)
Abs Immature Granulocytes: 0.07 10*3/uL (ref 0.00–0.07)
Basophils Absolute: 0 10*3/uL (ref 0.0–0.1)
Basophils Relative: 1 %
Eosinophils Absolute: 0 10*3/uL (ref 0.0–0.5)
Eosinophils Relative: 1 %
HCT: 32.3 % — ABNORMAL LOW (ref 36.0–46.0)
Hemoglobin: 10.4 g/dL — ABNORMAL LOW (ref 12.0–15.0)
Immature Granulocytes: 1 %
Lymphocytes Relative: 19 %
Lymphs Abs: 1.1 10*3/uL (ref 0.7–4.0)
MCH: 28.8 pg (ref 26.0–34.0)
MCHC: 32.2 g/dL (ref 30.0–36.0)
MCV: 89.5 fL (ref 80.0–100.0)
Monocytes Absolute: 0.4 10*3/uL (ref 0.1–1.0)
Monocytes Relative: 6 %
Neutro Abs: 4.1 10*3/uL (ref 1.7–7.7)
Neutrophils Relative %: 72 %
Platelet Count: 253 10*3/uL (ref 150–400)
RBC: 3.61 MIL/uL — ABNORMAL LOW (ref 3.87–5.11)
RDW: 15.3 % (ref 11.5–15.5)
WBC Count: 5.6 10*3/uL (ref 4.0–10.5)
nRBC: 1.3 % — ABNORMAL HIGH (ref 0.0–0.2)

## 2020-02-01 MED ORDER — ONDANSETRON HCL 4 MG/2ML IJ SOLN
INTRAMUSCULAR | Status: AC
  Filled 2020-02-01: qty 4

## 2020-02-01 MED ORDER — PACLITAXEL PROTEIN-BOUND CHEMO INJECTION 100 MG
125.0000 mg/m2 | Freq: Once | INTRAVENOUS | Status: AC
  Administered 2020-02-01: 250 mg via INTRAVENOUS
  Filled 2020-02-01: qty 50

## 2020-02-01 MED ORDER — SODIUM CHLORIDE 0.9% FLUSH
10.0000 mL | Freq: Once | INTRAVENOUS | Status: AC
  Administered 2020-02-01: 10 mL
  Filled 2020-02-01: qty 10

## 2020-02-01 MED ORDER — ONDANSETRON HCL 4 MG/2ML IJ SOLN
8.0000 mg | Freq: Once | INTRAMUSCULAR | Status: AC
  Administered 2020-02-01: 8 mg via INTRAVENOUS

## 2020-02-01 MED ORDER — LIDOCAINE VISCOUS HCL 2 % MT SOLN: 5.0000 mL | OROMUCOSAL | 2 refills | Status: DC | PRN

## 2020-02-01 MED ORDER — SODIUM CHLORIDE 0.9% FLUSH
10.0000 mL | INTRAVENOUS | Status: DC | PRN
  Administered 2020-02-01: 10 mL
  Filled 2020-02-01: qty 10

## 2020-02-01 MED ORDER — SODIUM CHLORIDE 0.9 % IV SOLN
2000.0000 mg | Freq: Once | INTRAVENOUS | Status: AC
Start: 2020-02-01 — End: 2020-02-01
  Administered 2020-02-01: 2000 mg via INTRAVENOUS
  Filled 2020-02-01: qty 52.6

## 2020-02-01 MED ORDER — FLUCONAZOLE 100 MG PO TABS: 100.0000 mg | ORAL_TABLET | Freq: Every day | ORAL | 0 refills | Status: DC

## 2020-02-01 MED ORDER — SODIUM CHLORIDE 0.9 % IV SOLN
Freq: Once | INTRAVENOUS | Status: AC
  Filled 2020-02-01: qty 250

## 2020-02-01 MED ORDER — HEPARIN SOD (PORK) LOCK FLUSH 100 UNIT/ML IV SOLN
500.0000 [IU] | Freq: Once | INTRAVENOUS | Status: AC | PRN
  Administered 2020-02-01: 500 [IU]
  Filled 2020-02-01: qty 5

## 2020-02-01 MED ORDER — MAGIC MOUTHWASH W/LIDOCAINE: 5.0000 mL | Freq: Four times a day (QID) | ORAL | 0 refills | Status: DC

## 2020-02-01 MED FILL — FLUCONAZOLE 100 MG TAB: 100 | 7 days supply | Qty: 7 | Fill #0

## 2020-02-01 MED FILL — LIDOCAINE 2% VISCOUS SOLN: 2 | 20 days supply | Qty: 200 | Fill #0

## 2020-02-01 MED FILL — MAGIC MOUTHWASH N/B/M: 12 days supply | Qty: 240 | Fill #0

## 2020-02-01 NOTE — Patient Instructions (Signed)

## 2020-02-01 NOTE — Progress Notes (Signed)
Pt. complained of mouth ulcers states she is using magic mouthwash and its working some, but not great. Pt. tongue coated white, thrush noted. Sandi Mealy PA notified and in for assessment. Prescriptions sent to pt. Pharmacy. Pt. Instructed to call if no improvement noted.  Pt. stable for discharge. Left via ambulation, no respiratory distress noted.

## 2020-02-01 NOTE — Patient Instructions (Signed)
[facility] Discharge Instructions for Patients Receiving Chemotherapy  Today you received the following chemotherapy agents: Paclitaxel-protein bound (Abraxane) and Gemcitabine (Gemzar).  To help prevent nausea and vomiting after your treatment, we encourage you to take your nausea medication as directed by your MD.   If you develop nausea and vomiting that is not controlled by your nausea medication, call the clinic.   BELOW ARE SYMPTOMS THAT SHOULD BE REPORTED IMMEDIATELY:  *FEVER GREATER THAN 100.5 F  *CHILLS WITH OR WITHOUT FEVER  NAUSEA AND VOMITING THAT IS NOT CONTROLLED WITH YOUR NAUSEA MEDICATION  *UNUSUAL SHORTNESS OF BREATH  *UNUSUAL BRUISING OR BLEEDING  TENDERNESS IN MOUTH AND THROAT WITH OR WITHOUT PRESENCE OF ULCERS  *URINARY PROBLEMS  *BOWEL PROBLEMS  UNUSUAL RASH Items with * indicate a potential emergency and should be followed up as soon as possible.  Feel free to call the clinic should you have any questions or concerns. The clinic phone number is [phone].  Please show the [place] at check-in to the Emergency Department and triage nurse.

## 2020-02-02 LAB — CULTURE, BLOOD (SINGLE): Culture: NO GROWTH

## 2020-02-04 ENCOUNTER — Other Ambulatory Visit: Payer: Self-pay | Admitting: Hematology and Oncology

## 2020-02-04 ENCOUNTER — Encounter: Payer: Self-pay | Admitting: Hematology and Oncology

## 2020-02-04 MED ORDER — AZITHROMYCIN 500 MG PO TABS: 500.0000 mg | ORAL_TABLET | Freq: Every day | ORAL | 0 refills | Status: DC

## 2020-02-04 NOTE — Progress Notes (Signed)
Patient was seen in the infusion room today.  She has a history of mucositis and thrush.  She has been using Magic mouthwash with benefit.  She continues to have oral tenderness.  A brief exam was completed which showed several small ulcerations in the lateral aspects of her tongue and some small areas of whitish plaquing.  She was given prescription for Diflucan 100 mg once daily, viscous lidocaine with instructions to swish and spit as needed for oral tenderness and she was given a refill of Magic mouthwash.  Sandi Mealy, MHS, PA-C Physician Assistant

## 2020-02-05 ENCOUNTER — Encounter: Payer: Self-pay | Admitting: Rheumatology

## 2020-02-06 ENCOUNTER — Encounter: Payer: Self-pay | Admitting: Hematology and Oncology

## 2020-02-06 ENCOUNTER — Telehealth: Payer: Self-pay

## 2020-02-06 ENCOUNTER — Ambulatory Visit: Payer: BC Managed Care – PPO

## 2020-02-06 NOTE — Telephone Encounter (Signed)
Called and left a message asking her to call the office back. 

## 2020-02-06 NOTE — Telephone Encounter (Signed)
-----   Message from Heath Lark, MD sent at 02/06/2020 10:03 AM EST ----- Regarding: can you call her and ask if her fever is better?

## 2020-02-07 ENCOUNTER — Ambulatory Visit: Payer: BC Managed Care – PPO | Admitting: Rheumatology

## 2020-02-07 DIAGNOSIS — Z7952 Long term (current) use of systemic steroids: Secondary | ICD-10-CM

## 2020-02-07 DIAGNOSIS — M1711 Unilateral primary osteoarthritis, right knee: Secondary | ICD-10-CM

## 2020-02-07 DIAGNOSIS — Z1589 Genetic susceptibility to other disease: Secondary | ICD-10-CM

## 2020-02-07 DIAGNOSIS — M62838 Other muscle spasm: Secondary | ICD-10-CM

## 2020-02-07 DIAGNOSIS — Z85038 Personal history of other malignant neoplasm of large intestine: Secondary | ICD-10-CM

## 2020-02-07 DIAGNOSIS — Z96652 Presence of left artificial knee joint: Secondary | ICD-10-CM

## 2020-02-07 DIAGNOSIS — Z9081 Acquired absence of spleen: Secondary | ICD-10-CM

## 2020-02-07 DIAGNOSIS — Z79899 Other long term (current) drug therapy: Secondary | ICD-10-CM

## 2020-02-07 DIAGNOSIS — M0609 Rheumatoid arthritis without rheumatoid factor, multiple sites: Secondary | ICD-10-CM

## 2020-02-07 DIAGNOSIS — D490 Neoplasm of unspecified behavior of digestive system: Secondary | ICD-10-CM

## 2020-02-07 DIAGNOSIS — C7961 Secondary malignant neoplasm of right ovary: Secondary | ICD-10-CM

## 2020-02-07 DIAGNOSIS — Z8261 Family history of arthritis: Secondary | ICD-10-CM

## 2020-02-08 ENCOUNTER — Encounter: Payer: Self-pay | Admitting: Hematology and Oncology

## 2020-02-11 ENCOUNTER — Ambulatory Visit (HOSPITAL_COMMUNITY): Payer: BC Managed Care – PPO

## 2020-02-11 ENCOUNTER — Other Ambulatory Visit: Payer: BC Managed Care – PPO

## 2020-02-12 ENCOUNTER — Encounter: Payer: Self-pay | Admitting: Hematology and Oncology

## 2020-02-12 ENCOUNTER — Other Ambulatory Visit: Payer: Self-pay

## 2020-02-12 ENCOUNTER — Inpatient Hospital Stay (HOSPITAL_BASED_OUTPATIENT_CLINIC_OR_DEPARTMENT_OTHER): Payer: BC Managed Care – PPO | Admitting: Hematology and Oncology

## 2020-02-12 ENCOUNTER — Inpatient Hospital Stay: Payer: BC Managed Care – PPO

## 2020-02-12 DIAGNOSIS — T451X5A Adverse effect of antineoplastic and immunosuppressive drugs, initial encounter: Secondary | ICD-10-CM

## 2020-02-12 DIAGNOSIS — C252 Malignant neoplasm of tail of pancreas: Secondary | ICD-10-CM

## 2020-02-12 DIAGNOSIS — G893 Neoplasm related pain (acute) (chronic): Secondary | ICD-10-CM

## 2020-02-12 DIAGNOSIS — C541 Malignant neoplasm of endometrium: Secondary | ICD-10-CM

## 2020-02-12 DIAGNOSIS — D6481 Anemia due to antineoplastic chemotherapy: Secondary | ICD-10-CM

## 2020-02-12 DIAGNOSIS — C7961 Secondary malignant neoplasm of right ovary: Secondary | ICD-10-CM | POA: Diagnosis not present

## 2020-02-12 DIAGNOSIS — Z7189 Other specified counseling: Secondary | ICD-10-CM

## 2020-02-12 DIAGNOSIS — Z5111 Encounter for antineoplastic chemotherapy: Secondary | ICD-10-CM | POA: Diagnosis not present

## 2020-02-12 LAB — CBC WITH DIFFERENTIAL (CANCER CENTER ONLY)
Abs Immature Granulocytes: 0.13 10*3/uL — ABNORMAL HIGH (ref 0.00–0.07)
Basophils Absolute: 0.1 10*3/uL (ref 0.0–0.1)
Basophils Relative: 1 %
Eosinophils Absolute: 0.2 10*3/uL (ref 0.0–0.5)
Eosinophils Relative: 2 %
HCT: 34.6 % — ABNORMAL LOW (ref 36.0–46.0)
Hemoglobin: 11 g/dL — ABNORMAL LOW (ref 12.0–15.0)
Immature Granulocytes: 2 %
Lymphocytes Relative: 34 %
Lymphs Abs: 2.5 10*3/uL (ref 0.7–4.0)
MCH: 28.9 pg (ref 26.0–34.0)
MCHC: 31.8 g/dL (ref 30.0–36.0)
MCV: 91.1 fL (ref 80.0–100.0)
Monocytes Absolute: 1.6 10*3/uL — ABNORMAL HIGH (ref 0.1–1.0)
Monocytes Relative: 23 %
Neutro Abs: 2.7 10*3/uL (ref 1.7–7.7)
Neutrophils Relative %: 38 %
Platelet Count: 546 10*3/uL — ABNORMAL HIGH (ref 150–400)
RBC: 3.8 MIL/uL — ABNORMAL LOW (ref 3.87–5.11)
RDW: 17.2 % — ABNORMAL HIGH (ref 11.5–15.5)
WBC Count: 7.2 10*3/uL (ref 4.0–10.5)
nRBC: 0.4 % — ABNORMAL HIGH (ref 0.0–0.2)

## 2020-02-12 LAB — CMP (CANCER CENTER ONLY)
ALT: 124 U/L — ABNORMAL HIGH (ref 0–44)
AST: 52 U/L — ABNORMAL HIGH (ref 15–41)
Albumin: 3.5 g/dL (ref 3.5–5.0)
Alkaline Phosphatase: 99 U/L (ref 38–126)
Anion gap: 10 (ref 5–15)
BUN: 13 mg/dL (ref 6–20)
CO2: 24 mmol/L (ref 22–32)
Calcium: 9.3 mg/dL (ref 8.9–10.3)
Chloride: 106 mmol/L (ref 98–111)
Creatinine: 0.63 mg/dL (ref 0.44–1.00)
GFR, Estimated: >60 mL/min (ref >60)
Glucose, Bld: 80 mg/dL (ref 70–99)
Potassium: 4.2 mmol/L (ref 3.5–5.1)
Sodium: 140 mmol/L (ref 135–145)
Total Bilirubin: <0.2 mg/dL — ABNORMAL LOW (ref 0.3–1.2)
Total Protein: 7.1 g/dL (ref 6.5–8.1)

## 2020-02-12 MED ORDER — SODIUM CHLORIDE 0.9% FLUSH
10.0000 mL | INTRAVENOUS | Status: DC | PRN
  Administered 2020-02-12: 10 mL
  Filled 2020-02-12: qty 10

## 2020-02-12 MED ORDER — SODIUM CHLORIDE 0.9 % IV SOLN
2000.0000 mg | Freq: Once | INTRAVENOUS | Status: AC
  Administered 2020-02-12: 2000 mg via INTRAVENOUS
  Filled 2020-02-12: qty 52.6

## 2020-02-12 MED ORDER — PACLITAXEL PROTEIN-BOUND CHEMO INJECTION 100 MG
125.0000 mg/m2 | Freq: Once | INTRAVENOUS | Status: AC
  Administered 2020-02-12: 250 mg via INTRAVENOUS
  Filled 2020-02-12: qty 50

## 2020-02-12 MED ORDER — SODIUM CHLORIDE 0.9 % IV SOLN
Freq: Once | INTRAVENOUS | Status: AC
Start: 2020-02-12 — End: 2020-02-12
  Filled 2020-02-12: qty 250

## 2020-02-12 MED ORDER — SODIUM CHLORIDE 0.9% FLUSH
10.0000 mL | Freq: Once | INTRAVENOUS | Status: AC
  Administered 2020-02-12: 10 mL
  Filled 2020-02-12: qty 10

## 2020-02-12 MED ORDER — HYDROMORPHONE HCL 4 MG PO TABS: 4.0000 mg | ORAL_TABLET | ORAL | 0 refills | Status: DC | PRN

## 2020-02-12 MED ORDER — ONDANSETRON HCL 4 MG/2ML IJ SOLN
INTRAMUSCULAR | Status: AC
  Filled 2020-02-12: qty 4

## 2020-02-12 MED ORDER — ONDANSETRON HCL 4 MG/2ML IJ SOLN
8.0000 mg | Freq: Once | INTRAMUSCULAR | Status: AC
  Administered 2020-02-12: 8 mg via INTRAVENOUS

## 2020-02-12 MED ORDER — HEPARIN SOD (PORK) LOCK FLUSH 100 UNIT/ML IV SOLN
500.0000 [IU] | Freq: Once | INTRAVENOUS | Status: AC | PRN
  Administered 2020-02-12: 500 [IU]
  Filled 2020-02-12: qty 5

## 2020-02-12 NOTE — Assessment & Plan Note (Signed)
She has less pain since she started on chemo She will continue to take Dilaudid as needed

## 2020-02-12 NOTE — Assessment & Plan Note (Signed)

## 2020-02-12 NOTE — Assessment & Plan Note (Signed)
Overall, majority of side effects from recent treatment has resolved We will continue treatment as scheduled I will continue close monitoring of her tumor marker If her tumor marker continues to trend upwards, I might repeat imaging study after 2 cycles of therapy However, if her tumor marker stabilize, I will repeat imaging study after cycle 3

## 2020-02-12 NOTE — Progress Notes (Signed)
[facility] OFFICE PROGRESS NOTE  Patient Care Team: [name], MD as PCP - General (Family Medicine)  ASSESSMENT & PLAN:  Malignant neoplasm of tail of pancreas ([place]) Overall, majority of side effects from recent treatment has resolved We will continue treatment as scheduled I will continue close monitoring of her tumor marker If her tumor marker continues to trend upwards, I might repeat imaging study after 2 cycles of therapy However, if her tumor marker stabilize, I will repeat imaging study after cycle 3  Cancer associated pain She has less pain since she started on chemo She will continue to take Dilaudid as needed  Anemia due to antineoplastic chemotherapy This is likely due to recent treatment. The patient denies recent history of bleeding such as epistaxis, hematuria or hematochezia. She is asymptomatic from the anemia. I will observe for now.  She does not require transfusion now. I will continue the chemotherapy at current dose without dosage adjustment.  If the anemia gets progressive worse in the future, I might have to delay her treatment or adjust the chemotherapy dose.    No orders of the defined types were placed in this encounter.   All questions were answered. The patient knows to call the clinic with any problems, questions or concerns. The total time spent in the appointment was 20 minutes encounter with patients including review of chart and various tests results, discussions about plan of care and coordination of care plan   Heath Lark, MD 02/12/2020 11:38 AM  INTERVAL HISTORY: Please see below for problem oriented charting. 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 Mixed attenuation mass emanates from the right adnexa measuring 10.8 x 8.0 cm very suspicious for right ovarian carcinoma. 2. Abnormality of the tail of the pancreas may be due to mild pancreatitis and small pseudocyst formation, but neoplasm cannot be excluded. 3. Small amount of ascites within abdomen and pelvis. 4. Small nonobstructing renal calculi bilaterally.    08/30/2017 Pathology Results   1. Uterus and cervix, with left fallopian tube - ENDOMETRIOID ADENOCARCINOMA, FIGO GRADE I, ARISING IN A BACKGROUND OF DIFFUSE COMPLEX ATYPICAL HYPERPLASIA. - CARCINOMA INVADES FOR OF DEPTH OF 0.2 CM WHERE THICKNESS OF MYOMETRIAL WALL IS 2.1 CM. - ALL RESECTION MARGINS ARE NEGATIVE FOR CARCINOMA. - NEGATIVE FOR LYMPHOVASCULAR OR PERINEURAL INVASION. - CERVICAL STROMA IS NOT INVOLVED. - SEE ONCOLOGY TABLE. - SEE NOTE 2. Ovary and fallopian tube, right - PRIMARY OVARIAN ENDOMETRIOID ADENOCARCINOMA, FIGO GRADE II, 12 CM. - THE OVARIAN SURFACE IS FOCALLY INVOLVED BY CARCINOMA. - NEGATIVE FOR LYMPHOVASCULAR INVASION. - BENIGN UNREMARKABLE FALLOPIAN TUBE, NEGATIVE FOR CARCINOMA. - SEE ONCOLOGY TABLE. - SEE NOTE 3. Cul-de-sac biopsy - METASTATIC ADENOCARCINOMA, MOST CONSISTENT WITH PRIMARY OVARIAN ENDOMETRIOID ADENOCARCINOMA. 4. Ovary, left - BENIGN UNREMARKABLE OVARY, NEGATIVE FOR MALIGNANCY. Microscopic Comment 1. UTERUS, CARCINOMA OR CARCINOSARCOMA Procedure: Total hysterectomy with bilateral salpingo-oophorectomy. Histologic type: Endometrioid adenocarcinoma. Histologic Grade: FIGO Grade I Myometrial invasion: Depth of invasion: 2 mm Myometrial thickness: 21 mm Uterine Serosa Involvement: Not identified Cervical stromal involvement: Not identified Extent of involvement of other  organs: Not  applicable Lymphovascular invasion: Not identified Regional Lymph Nodes: Examined: 0 Sentinel 0 Non-sentinel 0 Total Tumor block for ancillary studies: 1H MMR / MSI testing: Pending Pathologic Stage Classification (pTNM, AJCC 8th edition): pT1a, pNX (v4.1.0.0) 2. OVARY or FALLOPIAN TUBE or PRIMARY PERITONEUM: Procedure: Salpingo-oophorectomy Specimen Integrity: Intact Tumor Site: Right ovary Ovarian Surface Involvement (required only if applicable): Focally involved by carcinoma Fallopian Tube Surface Involvement (required only if applicable): Not identified Tumor Size: 12 cm Histologic Type: Endometrioid adenocarcinoma Histologic Grade: Grade II Implants (required for advanced stage serous/seromucinous borderline tumors only): Not applicable Other Tissue/ Organ Involvement: Cul de sac biopsy involved by tumor Largest Extrapelvic Peritoneal Focus (required only if applicable): Not applicable Peritoneal/Ascitic Fluid: Negative for carcinoma (case # [id number]) Treatment Effect (required only for high-grade serous carcinomas): Not applicable Regional Lymph Nodes: No lymph nodes submitted or found Number of Lymph Nodes Examined: 0 Pathologic Stage Classification (pTNM, AJCC 8th Edition): pT2b, pN0 Representative Tumor Block: 2B and 2E 1. Molecular study for microsatellite instability and immunohistochemical stains for MMR-related proteins are pending and will be reported in an addendum. 2. Immunohistochemical stain show that the ovarian tumor is positive for CK7 and PAX8 (both diffuse), CDX2 (patchy and weak); and negative for CK20. This immunoprofile is consistent with the above diagnosis. Dr. Lyndon Code has reviewed this case and concurs with the above diagnosis. Molecular study for microsatellite instability and immunohistochemical stains for MMR-related proteins are pending and will be reported in an addendum   08/30/2017 Genetic Testing   Patient has genetic testing done for MSI  Results  revealed patient has the following mutation(s): loss of [facility] 2   08/30/2017 Surgery   Surgeon: Mart Piggs, MD Pre-operative Diagnosis:  1. Adnexal mass 2. Abnormal uterine bleeding 3. H/o Cecal CA  Post-operative Diagnosis:  1. Adhesive disease post colon resection 2. Endometrial cancer NOS 3. Adenocarcinoma unknown origin, right ovary, suspicious for GI primary  Operation:  1. Lysis of adhesions ~30 minutes 2. Robotic-assisted laparoscopic total hysterectomy with right salpingo-oophorectomy and left salpingectomy 3. Left oophorectomy (RA-laparoscopic) 4. Pelvic washings  Findings: Adhesions of omentum to anterior abdominal wall. Enlarged cystic right ovary ~10cm. Uterus had small nodules on serosa near where right adnexa was intimate with the surface. No obvious intraoperative rupture of cyst, although in 2 areas the wall was thin and one of these areas had some bleeding. Slight scarring of left bladder dome to LUS/cervix. Uterus on frozen section c/w hyperplasia and a small focus of endometrial CA - no myo invasion, <2cm in size. Frozen section on the right adnexa was carcinoma, met from colon or possibly Gyn, favor GI primary, defer to permanent. Left ovary was WNL.     09/15/2017 Cancer Staging   Staging form: Corpus Uteri - Carcinoma and Carcinosarcoma, AJCC 8th Edition - Pathologic: FIGO Stage IA (pT1a, pN0, cM0) - Signed by Heath Lark, MD on 09/15/2017   09/26/2017 Procedure   Successful placement of a right internal jugular approach power injectable Port-A-Cath. The catheter is ready for immediate use.   11/07/2017 Imaging   1. Since 08/18/2017, similar to slight decrease in size of a pancreatic body/tail junction lesion. Cross modality comparison relative to 09/16/2017 MRI is also grossly similar. 2. No evidence of metastatic disease. 3. Aortic Atherosclerosis (ICD10-I70.0).  4. Left nephrolithiasis.   11/18/2017 Genetic Testing   MSH2 c.2237dupT pathogenic  mutation identified in the CancerNext panel.  The CancerNext gene panel offered by [employer] includes sequencing and rearrangement analysis for the  following 34 genes:   APC, ATM, BARD1, BMPR1A, BRCA1, BRCA2, BRIP1, CDH1, CDK4, CDKN2A, CHEK2, DICER1, HOXB13, EPCAM, GREM1, MLH1, MRE11A, MSH2, MSH6, MUTYH, NBN, NF1, PALB2, PMS2, POLD1, POLE, PTEN, RAD50, RAD51C, RAD51D, SMAD4, SMARCA4, STK11, and TP53.  The report date is November 18, 2017.  MSH2 [id number] pathogenic mutation identified on somatic testing.  These results are consistent with a diagnosis of Lynch syndrome.   06/18/2019 - 08/14/2019 Chemotherapy   The patient had FOLFIRINOX for chemotherapy treatment.     01/19/2020 -  Chemotherapy   The patient had PACLitaxel-protein bound (ABRAXANE) chemo infusion 250 mg, 125 mg/m2 = 250 mg, Intravenous,  Once, 1 of 4 cycles Administration: 250 mg (01/19/2020), 250 mg (01/25/2020), 250 mg (02/01/2020) ondansetron (ZOFRAN) injection 8 mg, 8 mg (100 % of original dose 8 mg), Intravenous,  Once, 1 of 4 cycles Dose modification: 8 mg (original dose 8 mg, Cycle 1) Administration: 8 mg (01/19/2020), 8 mg (01/25/2020), 8 mg (02/01/2020) gemcitabine (GEMZAR) 2,000 mg in sodium chloride 0.9 % 250 mL chemo infusion, 2,052 mg, Intravenous,  Once, 1 of 4 cycles Administration: 2,000 mg (01/19/2020), 2,000 mg (01/25/2020), 2,000 mg (02/01/2020)  for chemotherapy treatment.    Secondary malignant neoplasm of right ovary ([place])  08/23/2017 Tumor Marker   Patient's tumor was tested for the following markers: CA-125 Results of the tumor marker test revealed 139.8   08/31/2017 Initial Diagnosis   Secondary malignant neoplasm of right ovary ([place])   09/15/2017 Cancer Staging   Staging form: Ovary, Fallopian Tube, and Primary Peritoneal Carcinoma, AJCC 8th Edition - Pathologic: Stage IIB (pT2b, pN0, cM0) - Signed by Heath Lark, MD on 09/15/2017   09/27/2017 Imaging   No evidence of metastatic disease  or other acute findings within the thorax.  4 cm low-attenuation mass in pancreatic tail, highly suspicious for pancreatic carcinoma. This is caused splenic vein thrombosis, with new venous collaterals in the left upper quadrant. Consider endoscopic ultrasound with FNA for tissue diagnosis.  Stable benign hepatic hemangioma.    09/27/2017 Tumor Marker   Patient's tumor was tested for the following markers: CA-125 Results of the tumor marker test revealed 39.4   11/02/2017 Tumor Marker   Patient's tumor was tested for the following markers: CA-125 Results of the tumor marker test revealed 19   11/07/2017 Tumor Marker   Patient's tumor was tested for the following markers: CA-125 Results of the tumor marker test revealed 18.3   11/18/2017 Genetic Testing   MSH2 c.2237dupT pathogenic mutation identified in the CancerNext panel.  The CancerNext gene panel offered by [employer] includes sequencing and rearrangement analysis for the following 34 genes:   APC, ATM, BARD1, BMPR1A, BRCA1, BRCA2, BRIP1, CDH1, CDK4, CDKN2A, CHEK2, DICER1, HOXB13, EPCAM, GREM1, MLH1, MRE11A, MSH2, MSH6, MUTYH, NBN, NF1, PALB2, PMS2, POLD1, POLE, PTEN, RAD50, RAD51C, RAD51D, SMAD4, SMARCA4, STK11, and TP53.  The report date is November 18, 2017.  MSH2 [id number] pathogenic mutation identified on somatic testing.  These results are consistent with a diagnosis of Lynch syndrome.   12/15/2017 Tumor Marker   Patient's tumor was tested for the following markers: CA-125 Results of the tumor marker test revealed 57.3   01/16/2018 Tumor Marker   Patient's tumor was tested for the following markers: CA-125 Results of the tumor marker test revealed 24.2   04/10/2018 Tumor Marker   Patient's tumor was tested for the following markers: CA-125 Results of the tumor marker test revealed 18.2   07/12/2018 Tumor Marker   Patient's tumor was tested for  the following markers: CA-125 Results of the tumor marker  test revealed 11.8   10/16/2018 Tumor Marker   Patient's tumor was tested for the following markers: CA125 Results of the tumor marker test revealed 12.4.   01/18/2020 Tumor Marker   Patient's tumor was tested for the following markers: CA-125 Results of the tumor marker test revealed 18   Malignant neoplasm of tail of pancreas ([place])  10/13/2017 Pathology Results   Pancreas tail mass, endoscopic ultrasound-guided, fine needle aspiration II (smears and cell block): Adenocarcinoma   11/18/2017 Genetic Testing   MSH2 c.2237dupT pathogenic mutation identified in the CancerNext panel.  The CancerNext gene panel offered by [employer] includes sequencing and rearrangement analysis for the following 34 genes:   APC, ATM, BARD1, BMPR1A, BRCA1, BRCA2, BRIP1, CDH1, CDK4, CDKN2A, CHEK2, DICER1, HOXB13, EPCAM, GREM1, MLH1, MRE11A, MSH2, MSH6, MUTYH, NBN, NF1, PALB2, PMS2, POLD1, POLE, PTEN, RAD50, RAD51C, RAD51D, SMAD4, SMARCA4, STK11, and TP53.  The report date is November 18, 2017.  MSH2 [id number] pathogenic mutation identified on somatic testing.  These results are consistent with a diagnosis of Lynch syndrome.   11/24/2017 Pathology Results   A. "TAIL OF PANCREAS AND SPLEEN", DISTAL PANCREATECTOMY AND SPLENECTOMY: Invasive ductal adenocarcinoma, moderately to poorly differentiated with focal signet ring cell features, of pancreas (distal).   The carcinoma is 2.5 cm in greatest dimension grossly.   Treatment effects present in the form of fibrosis (50%). No lymphovascular or definite perineural invasion identified. All surgical margins are negative for tumor or high-grade dysplasia. Adjacent mucinous neoplasm, most consistent with Intraductal papillary mucinous neoplasm (IPMN) with low-grade dysplasia.   Uninvolved pancreas show atrophy and focal acute inflammation.   Twelve benign lymph nodes (0/12). Spleen with no  significant histopathologic abnormalities.  PROCEDURE: distal pancreatectomy and splenectomy TUMOR SITE: distal pancreas TUMOR SIZE:  GREATEST DIMENSION: 2.5 cm  ADDITIONAL DIMENSIONS:  x  cm HISTOLOGIC TYPE: ductal adenocarcinoma HISTOLOGIC GRADE: grade 3 TUMOR EXTENSION: peripancreatic soft tissue MARGINS: negative for tumor TREATMENT EFFECT: treatment effects present in the form of fibrosis (50%). LYMPHOVASCULAR INVASION: not identified PERINEURAL INVASION: no definite evidence REGIONAL LYMPH NODES:   NUMBER OF LYMPH NODES INVOLVED: 0   NUMBER OF LYMPH NODES EXAMINED: 12 PATHOLOGIC STAGE CLASSIFICATION (pTNM, AJCC 8th Ed): ypT2, ypN0 DISTANT METASTASIS (pM): pMx ADDITIONAL PATHOLOGIC FINDINGS: mucinous neoplasm, most consistent with intraductal papillary mucinous neoplasm (IPMN) with low-grade dysplasia, is identified adjacent to the main tumor.   11/24/2017 Cancer Staging   Staging form: Exocrine Pancreas, AJCC 8th Edition - Pathologic stage from 11/24/2017: Stage IB (pT2, pN0, cM0) - Signed by Truitt Merle, MD on 01/03/2018   11/25/2017 Surgery   She had surgery at [facility] 1. Exploratory Laparotomy 2. Distal Pancreatectomy and Splenectomy 3. Intraoperative Ultrasound 4. Open Cholecystectomy    12/15/2017 Cancer Staging   Staging form: Exocrine Pancreas, AJCC 8th Edition - Clinical: Stage IB (cT2, cN0, cM0) - Signed by Heath Lark, MD on 12/15/2017   12/28/2017 Imaging   12/28/2017 CT Abdomen  IMPRESSION: 1. Postoperative findings from recent partial pancreatectomy including a 21 cubic cm fluid collection along the pancreatic resection margin which could represent early pseudocyst. 2. Nodularity along the lateral limb of the left adrenal gland could also be postoperative but merit surveillance, as the pancreatic lesion was in close proximity to this adrenal gland on the prior CT of 11/07/2017. 3. Asymmetric fullness inferiorly in the left breast. The patient has  a history of prior left breast procedures, correlation with mammography is recommended. 4.  Other imaging findings of potential clinical significance: Stable hemangioma in the left hepatic lobe. Splenectomy. Aortic Atherosclerosis (ICD10-I70.0). Right hemicolectomy. Small focus of fat necrosis in the right anterior abdominal wall subcutaneous tissues near the laparotomy site. Bilateral nonobstructive nephrolithiasis.   01/02/2018 Tumor Marker   Patient's tumor was tested for the following markers: CA-19-9 Results of the tumor marker test revealed 5   01/03/2018 - 06/05/2018 Chemotherapy   She received modified dose FOLFIRINOX   04/10/2018 Imaging   1. Distal pancreatectomy, without findings of recurrent or metastatic disease. 2. Incompletely imaged hypoenhancement within lower lobe right pulmonary artery branch is likely chronic (but interval since 10/09/2017) pulmonary embolism. Dedicated CTA could further evaluate. 3. Decrease in size of a peripancreatic complex fluid collection anteriorly, likely a resolving pseudocyst. 4. Decreased size of minimal fluid versus a borderline sized node in the gastrohepatic ligament. Recommend attention on follow-up. 5. Hepatic steatosis with a segment 4 hemangioma. 6. Aortic Atherosclerosis (ICD10-I70.0). This is significantly age advanced.   07/12/2018 Tumor Marker   Patient's tumor was tested for the following markers: CA-19-9 Results of the tumor marker test revealed 6   07/12/2018 Imaging   1. Status post distal pancreatectomy with stable postoperative fluid collection adjacent to the ventral aspect of the pancreatic head. No findings to suggest metastatic disease in the abdomen or pelvis. 2. Hepatic steatosis with small cavernous hemangioma in segment 4A of the liver. 3. Slight decreased size of nonenlarged gastrohepatic ligament lymph node, presumably benign. 4. Aortic atherosclerosis.    10/16/2018 Imaging   Status post distal pancreatectomy.  Stable postoperative seroma along the surgical margin.   Status post hysterectomy and right oophorectomy.   Status post right hemicolectomy with appendectomy.   No evidence of recurrent or metastatic disease.   No colonic wall thickening or mass is evident on CT.   01/22/2019 Tumor Marker   Patient's tumor was tested for the following markers: CA-19.9 Results of the tumor marker test revealed 5   01/22/2019 Imaging   1. No evidence of local recurrence or metastatic disease status post distal pancreatectomy and splenectomy. 2. Stable small seroma anterior to the pancreatic head. 3. Postsurgical changes as described. 4. Stable additional incidental findings including a hepatic hemangioma, nonobstructing bilateral renal calculi and aortic Atherosclerosis (ICD10-I70.0).   04/18/2019 Imaging   1. Status post distal pancreatectomy and splenectomy. There has been a gradual increase in ill-defined soft tissue and celiac axis/SMA origin lymph nodes adjacent to the distal pancreatectomy margin and lesser curvature of the stomach/gastric body over sequential prior examinations dated 01/22/2019 and 09/26/2018. An enlarged lymph node or soft tissue nodule adjacent to the SMA origin now measures 1.8 x 0.8 cm. Findings are concerning for local recurrence of pancreatic malignancy. 2. Separately from the above findings, there remains a 1.0 cm low-attenuation nodule anterior to the remnant pancreatic neck, most consistent with postoperative seroma 3. No evidence of distant metastatic disease in the chest, abdomen, or pelvis. 4. Postoperative findings of cholecystectomy, right hemicolectomy, and hysterectomy. 5. Bilateral nonobstructive nephrolithiasis. 6.  Aortic Atherosclerosis (ICD10-I70.0).       05/28/2019 Tumor Marker   Patient's tumor was tested for the following markers: CA19-9 Results of the tumor marker test revealed 18   05/28/2019 Imaging   1. Status post distal pancreatectomy with continued  further progression of the ill-defined soft tissue to the left of the celiac axis/SMA origin, now measuring 1.9 x 1.9 cm and highly concerning for recurrent/metastatic disease. This process abuts both the celiac axis, SMA, and  posterior wall of the mid stomach. PET-CT may prove helpful to further evaluate. 2. Bandlike ill-defined soft tissue in the anterior abdomen, potentially peritoneal or omental is similar to perhaps minimally increased qualitatively in the interval. Close attention on follow-up recommended. 3. Left paraumbilical hernia contains a short segment of small bowel without complicating features. 4. Ill-defined very subtle area of decreased attenuation in the posterior aspect of hepatic segment IV. This is likely focal fatty deposition. Close attention on follow-up recommended. 5.  Aortic Atherosclerois (ICD10-170.0)   06/07/2019 Pathology Results   Malignant cells consistent with adenocarcinoma   06/07/2019 Procedure   ENDOSCOPIC FINDING (limited views with linear echoendoscope): :      The examined esophagus was endoscopically normal.      The entire examined stomach was endoscopically normal.      ENDOSONOGRAPHIC FINDING: :      1. An irregular mass was identified in the region of the pancreatic tail resection site. The mass was stellate with very poorly defined borders. Fine needle aspiration for cytology was performed. Color Doppler imaging was utilized prior to needle puncture to confirm a lack of significant vascular structures within the needle path. Four passes were made with the 25 gauge (FNB) needle using a transgastric approach. A cytotechnologist was present to evaluate the adequacy of the specimen. Final cytology results are pending. Impression:               -  Irregular, stellate soft tissue mass in the region of the pancreatic tail resection site. This was sampled with four transgastric passes with an EUS FNB needle..   06/18/2019 - 08/14/2019 Chemotherapy   The patient  had FOLFIRINOX for chemotherapy treatment.     09/03/2019 Imaging   1. Findings are again highly concerning for locally recurrent disease adjacent to the pancreatectomy bed, with enlarging soft tissue mass which is intimately associated with the superior mesenteric artery, celiac axis and superior aspect of the left renal  vein, as detailed above. 2. No evidence of metastatic disease in the thorax. 3. 2.7 x 1.2 cm cavernous hemangioma in segment 4A of the liver again noted. 4. Small nonobstructive calculi in the collecting systems of both kidneys measuring up to 3 mm in the lower pole collecting system of the left kidney. 5. Aortic atherosclerosis. 6. Additional incidental findings, as above.   11/16/2019 Imaging   Similar-appearing ill-defined soft tissue the left of the celiac axis/SMA, most compatible with recurrent/metastatic disease.   Similar ill-defined nodularity within the anterior upper abdomen, potentially peritoneal or omental. Continued attention on follow-up is recommended.   12/31/2019 Tumor Marker   Patient's tumor was tested for the following markers: CA-19-9 Results of the tumor marker test revealed 298   01/10/2020 Imaging   1. Numerous small hypoenhancing liver masses scattered throughout the liver, all new since 11/16/2019 CT, compatible with new liver metastases. 2. Stable upper retroperitoneal mass encasing the celiac axis and abutting the pancreatic resection margin, compatible with stable locally recurrent tumor. 3. Stable subcentimeter indistinct anterior upper peritoneal implants. 4. No evidence of metastatic disease in the chest. 5. Chronic findings include: Nonobstructing left nephrolithiasis. Stable small left paraumbilical hernia containing a small bowel loop without acute bowel complication. Aortic Atherosclerosis (ICD10-I70.0).     01/19/2020 -  Chemotherapy   The patient had PACLitaxel-protein bound (ABRAXANE) chemo infusion 250 mg, 125 mg/m2 = 250 mg,  Intravenous,  Once, 1 of 4 cycles Administration: 250 mg (01/19/2020), 250 mg (01/25/2020), 250 mg (02/01/2020) ondansetron (ZOFRAN) injection 8  mg, 8 mg (100 % of original dose 8 mg), Intravenous,  Once, 1 of 4 cycles Dose modification: 8 mg (original dose 8 mg, Cycle 1) Administration: 8 mg (01/19/2020), 8 mg (01/25/2020), 8 mg (02/01/2020) gemcitabine (GEMZAR) 2,000 mg in sodium chloride 0.9 % 250 mL chemo infusion, 2,052 mg, Intravenous,  Once, 1 of 4 cycles Administration: 2,000 mg (01/19/2020), 2,000 mg (01/25/2020), 2,000 mg (02/01/2020)  for chemotherapy treatment.    01/25/2020 Tumor Marker   Patient's tumor was tested for the following markers: CA-19-9 Results of the tumor marker test revealed 1104.     REVIEW OF SYSTEMS:   Constitutional: Denies fevers, chills or abnormal weight loss Eyes: Denies blurriness of vision Ears, nose, mouth, throat, and face: Denies mucositis or sore throat Respiratory: Denies cough, dyspnea or wheezes Cardiovascular: Denies palpitation, chest discomfort or lower extremity swelling Gastrointestinal:  Denies nausea, heartburn or change in bowel habits Skin: Denies abnormal skin rashes Lymphatics: Denies new lymphadenopathy or easy bruising Behavioral/Psych: Mood is stable, no new changes  All other systems were reviewed with the patient and are negative.  I have reviewed the past medical history, past surgical history, social history and family history with the patient and they are unchanged from previous note.  ALLERGIES:  is allergic to codeine, penicillins, and bactrim [sulfamethoxazole-trimethoprim].  MEDICATIONS:  Current Outpatient Medications  Medication Sig Dispense Refill  . cetirizine (ZYRTEC) 10 MG tablet Take 10 mg by mouth daily.    . citalopram (CELEXA) 20 MG tablet TAKE 1 TABLET BY MOUTH EVERY DAY 90 tablet 4  . CREON 24000-76000 units CPEP Take 1 capsule by mouth See admin instructions. Take 1 capsule before each meal and 1 capsule  after each meal, take 1 cap with snacks    . diclofenac Sodium (VOLTAREN) 1 % GEL APPLY 2-4 GRAMS TO AFFECTED JOINT 4 TIMES DAILY AS NEEDED. 400 g 1  . estradiol (ESTRACE VAGINAL) 0.1 MG/GM vaginal cream Place 1 Applicatorful vaginally 3 (three) times a week. 42.5 g 12  . fluconazole (DIFLUCAN) 100 MG tablet Take 1 tablet (100 mg total) by mouth daily. 7 tablet 0  . fluticasone (FLONASE) 50 MCG/ACT nasal spray Place 1 spray into both nostrils daily.    Marland Kitchen gabapentin (NEURONTIN) 300 MG capsule Take 1 capsule (300 mg total) by mouth 3 (three) times daily. 90 capsule 11  . HYDROmorphone (DILAUDID) 4 MG tablet Take 1 tablet (4 mg total) by mouth every 4 (four) hours as needed. 60 tablet 0  . hydrOXYzine (ATARAX/VISTARIL) 10 MG tablet Take 1 tablet (10 mg total) by mouth 3 (three) times daily as needed. 30 tablet 1  . lidocaine (XYLOCAINE) 2 % solution Use as directed 5 mLs in the mouth or throat every 3 (three) hours as needed for mouth pain. Swish and spit. 200 mL 2  . lidocaine-prilocaine (EMLA) cream Apply 1 application topically daily as needed (port). Apply to affected area once 30 g 11  . LORazepam (ATIVAN) 0.5 MG tablet Take 1 tablet (0.5 mg total) by mouth every 8 (eight) hours as needed for anxiety (nausea). 30 tablet 0  . metFORMIN (GLUCOPHAGE) 500 MG tablet TAKE 1 TABLET BY MOUTH TWICE A DAY WITH A MEAL 180 tablet 11  . methocarbamol (ROBAXIN) 500 MG tablet TAKE 1 TABLET BY MOUTH THREE TIMES A DAY AS NEEDED FOR MUSCLE SPASMS 90 tablet 0  . Multiple Vitamin (MULTIVITAMIN WITH MINERALS) TABS tablet Take 1 tablet by mouth daily.    Marland Kitchen omeprazole (PRILOSEC) 40 MG capsule Take  1 capsule (40 mg total) by mouth daily. 30 capsule 11  . ondansetron (ZOFRAN) 8 MG tablet TAKE 1 TAB BY MOUTH EVERY 8HOURS AS NEEDED FOR REFRACTORY NAUSEA/VOMITING START ON DAY 3AFTER CHEMO 8 tablet 7  . predniSONE (DELTASONE) 5 MG tablet TAKE 2 TABLETS BY MOUTH DAILY WITH BREAKFAST. 60 tablet 2  . prochlorperazine (COMPAZINE)  10 MG tablet TAKE 1 TABLET BY MOUTH EVERY 6 HOURS AS NEEDED 90 tablet 3  . rivaroxaban (XARELTO) 20 MG TABS tablet Take 1 tablet (20 mg total) by mouth daily with supper. 30 tablet 9  . simethicone (MYLICON) 557 MG chewable tablet Chew 125 mg by mouth every 6 (six) hours as needed for flatulence.     No current facility-administered medications for this visit.    PHYSICAL EXAMINATION: ECOG PERFORMANCE STATUS: 1 - Symptomatic but completely ambulatory  Vitals:   02/12/20 1058  BP: (!) 143/89  Pulse: 79  Resp: 16  Temp: (!) 97.4 F (36.3 C)  SpO2: 99%   Filed Weights   02/12/20 1058  Weight: 210 lb 12.8 oz (95.6 kg)    GENERAL:alert, no distress and comfortable SKIN: skin color, texture, turgor are normal, no rashes or significant lesions EYES: normal, Conjunctiva are pink and non-injected, sclera clear OROPHARYNX:no exudate, no erythema and lips, buccal mucosa, and tongue normal  NECK: supple, thyroid normal size, non-tender, without nodularity LYMPH:  no palpable lymphadenopathy in the cervical, axillary or inguinal LUNGS: clear to auscultation and percussion with normal breathing effort HEART: regular rate & rhythm and no murmurs and no lower extremity edema ABDOMEN:abdomen soft, non-tender and normal bowel sounds Musculoskeletal:no cyanosis of digits and no clubbing  NEURO: alert & oriented x 3 with fluent speech, no focal motor/sensory deficits  LABORATORY DATA:  I have reviewed the data as listed    Component Value Date/Time   NA 138 02/01/2020 1111   K 4.1 02/01/2020 1111   CL 105 02/01/2020 1111   CO2 24 02/01/2020 1111   GLUCOSE 107 (H) 02/01/2020 1111   BUN 11 02/01/2020 1111   CREATININE 0.74 02/01/2020 1111   CREATININE 0.68 07/27/2017 0851   CALCIUM 9.1 02/01/2020 1111   PROT 7.4 02/01/2020 1111   ALBUMIN 3.5 02/01/2020 1111   AST 29 02/01/2020 1111   ALT 55 (H) 02/01/2020 1111   ALKPHOS 97 02/01/2020 1111   BILITOT <0.2 (L) 02/01/2020 1111   GFRNONAA  >60 02/01/2020 1111   GFRNONAA 106 07/27/2017 0851   GFRAA >60 11/09/2019 1012   GFRAA >60 09/27/2019 1535   GFRAA 123 07/27/2017 0851    No results found for: SPEP, UPEP  Lab Results  Component Value Date   WBC 7.2 02/12/2020   NEUTROABS 2.7 02/12/2020   HGB 11.0 (L) 02/12/2020   HCT 34.6 (L) 02/12/2020   MCV 91.1 02/12/2020   PLT 546 (H) 02/12/2020      Chemistry      Component Value Date/Time   NA 138 02/01/2020 1111   K 4.1 02/01/2020 1111   CL 105 02/01/2020 1111   CO2 24 02/01/2020 1111   BUN 11 02/01/2020 1111   CREATININE 0.74 02/01/2020 1111   CREATININE 0.68 07/27/2017 0851      Component Value Date/Time   CALCIUM 9.1 02/01/2020 1111   ALKPHOS 97 02/01/2020 1111   AST 29 02/01/2020 1111   ALT 55 (H) 02/01/2020 1111   BILITOT <0.2 (L) 02/01/2020 1111       RADIOGRAPHIC STUDIES: I have personally reviewed the radiological images as  listed and agreed with the findings in the report. DG Chest 2 View  Result Date: 01/28/2020 CLINICAL DATA:  Fever.  No chest complaints. EXAM: CHEST - 2 VIEW COMPARISON:  CT chest dated January 10, 2020. FINDINGS: Unchanged right chest wall port catheter. The heart size and mediastinal contours are within normal limits. Normal pulmonary vascularity. No focal consolidation, pleural effusion, or pneumothorax. No acute osseous abnormality. IMPRESSION: No active cardiopulmonary disease. Electronically Signed   By: Titus Dubin M.D.   On: 01/28/2020 12:25

## 2020-02-12 NOTE — Patient Instructions (Signed)
[facility] Discharge Instructions for Patients Receiving Chemotherapy  Today you received the following chemotherapy agents: Abraxane and Gemzar  To help prevent nausea and vomiting after your treatment, we encourage you to take your nausea medication as prescribed.    If you develop nausea and vomiting that is not controlled by your nausea medication, call the clinic.   BELOW ARE SYMPTOMS THAT SHOULD BE REPORTED IMMEDIATELY:  *FEVER GREATER THAN 100.5 F  *CHILLS WITH OR WITHOUT FEVER  NAUSEA AND VOMITING THAT IS NOT CONTROLLED WITH YOUR NAUSEA MEDICATION  *UNUSUAL SHORTNESS OF BREATH  *UNUSUAL BRUISING OR BLEEDING  TENDERNESS IN MOUTH AND THROAT WITH OR WITHOUT PRESENCE OF ULCERS  *URINARY PROBLEMS  *BOWEL PROBLEMS  UNUSUAL RASH Items with * indicate a potential emergency and should be followed up as soon as possible.  Feel free to call the clinic should you have any questions or concerns. The clinic phone number is [phone].  Please show the CHEMO ALERT CARD at check-in to the Emergency Department and triage nurse.   

## 2020-02-12 NOTE — Progress Notes (Signed)
Per Dr. Alvy Bimler, ok to treat with ALT 124.

## 2020-02-13 LAB — CA 125: Cancer Antigen (CA) 125: 18.2 U/mL (ref 0.0–38.1)

## 2020-02-19 ENCOUNTER — Other Ambulatory Visit: Payer: Self-pay

## 2020-02-19 ENCOUNTER — Inpatient Hospital Stay: Payer: BC Managed Care – PPO

## 2020-02-19 VITALS — BP 145/82 | HR 63 | Temp 98.5°F | Resp 18

## 2020-02-19 DIAGNOSIS — Z7189 Other specified counseling: Secondary | ICD-10-CM

## 2020-02-19 DIAGNOSIS — Z5111 Encounter for antineoplastic chemotherapy: Secondary | ICD-10-CM | POA: Diagnosis not present

## 2020-02-19 DIAGNOSIS — C252 Malignant neoplasm of tail of pancreas: Secondary | ICD-10-CM

## 2020-02-19 DIAGNOSIS — C541 Malignant neoplasm of endometrium: Secondary | ICD-10-CM

## 2020-02-19 DIAGNOSIS — C7961 Secondary malignant neoplasm of right ovary: Secondary | ICD-10-CM | POA: Diagnosis not present

## 2020-02-19 LAB — CMP (CANCER CENTER ONLY)
ALT: 235 U/L — ABNORMAL HIGH (ref 0–44)
AST: 104 U/L — ABNORMAL HIGH (ref 15–41)
Albumin: 3.3 g/dL — ABNORMAL LOW (ref 3.5–5.0)
Alkaline Phosphatase: 88 U/L (ref 38–126)
Anion gap: 8 (ref 5–15)
BUN: 13 mg/dL (ref 6–20)
CO2: 26 mmol/L (ref 22–32)
Calcium: 8.5 mg/dL — ABNORMAL LOW (ref 8.9–10.3)
Chloride: 106 mmol/L (ref 98–111)
Creatinine: 0.71 mg/dL (ref 0.44–1.00)
GFR, Estimated: >60 mL/min (ref >60)
Glucose, Bld: 162 mg/dL — ABNORMAL HIGH (ref 70–99)
Potassium: 4 mmol/L (ref 3.5–5.1)
Sodium: 140 mmol/L (ref 135–145)
Total Bilirubin: <0.2 mg/dL — ABNORMAL LOW (ref 0.3–1.2)
Total Protein: 6.5 g/dL (ref 6.5–8.1)

## 2020-02-19 LAB — CBC WITH DIFFERENTIAL (CANCER CENTER ONLY)
Abs Immature Granulocytes: 0.6 10*3/uL — ABNORMAL HIGH (ref 0.00–0.07)
Basophils Absolute: 0.1 10*3/uL (ref 0.0–0.1)
Basophils Relative: 2 %
Eosinophils Absolute: 0 10*3/uL (ref 0.0–0.5)
Eosinophils Relative: 0 %
HCT: 32.1 % — ABNORMAL LOW (ref 36.0–46.0)
Hemoglobin: 10 g/dL — ABNORMAL LOW (ref 12.0–15.0)
Immature Granulocytes: 9 %
Lymphocytes Relative: 47 %
Lymphs Abs: 3.2 10*3/uL (ref 0.7–4.0)
MCH: 28.9 pg (ref 26.0–34.0)
MCHC: 31.2 g/dL (ref 30.0–36.0)
MCV: 92.8 fL (ref 80.0–100.0)
Monocytes Absolute: 0.8 10*3/uL (ref 0.1–1.0)
Monocytes Relative: 12 %
Neutro Abs: 2 10*3/uL (ref 1.7–7.7)
Neutrophils Relative %: 30 %
Platelet Count: 740 10*3/uL — ABNORMAL HIGH (ref 150–400)
RBC: 3.46 MIL/uL — ABNORMAL LOW (ref 3.87–5.11)
RDW: 17.8 % — ABNORMAL HIGH (ref 11.5–15.5)
WBC Count: 6.7 10*3/uL (ref 4.0–10.5)
nRBC: 1.8 % — ABNORMAL HIGH (ref 0.0–0.2)

## 2020-02-19 MED ORDER — ONDANSETRON HCL 4 MG/2ML IJ SOLN
8.0000 mg | Freq: Once | INTRAMUSCULAR | Status: AC
  Administered 2020-02-19: 8 mg via INTRAVENOUS

## 2020-02-19 MED ORDER — HEPARIN SOD (PORK) LOCK FLUSH 100 UNIT/ML IV SOLN
500.0000 [IU] | Freq: Once | INTRAVENOUS | Status: AC | PRN
  Administered 2020-02-19: 500 [IU]
  Filled 2020-02-19: qty 5

## 2020-02-19 MED ORDER — SODIUM CHLORIDE 0.9 % IV SOLN
2000.0000 mg | Freq: Once | INTRAVENOUS | Status: AC
  Administered 2020-02-19: 2000 mg via INTRAVENOUS
  Filled 2020-02-19: qty 52.6

## 2020-02-19 MED ORDER — SODIUM CHLORIDE 0.9% FLUSH
10.0000 mL | Freq: Once | INTRAVENOUS | Status: AC
  Administered 2020-02-19: 10 mL
  Filled 2020-02-19: qty 10

## 2020-02-19 MED ORDER — PACLITAXEL PROTEIN-BOUND CHEMO INJECTION 100 MG
125.0000 mg/m2 | Freq: Once | INTRAVENOUS | Status: AC
  Administered 2020-02-19: 250 mg via INTRAVENOUS
  Filled 2020-02-19: qty 50

## 2020-02-19 MED ORDER — ONDANSETRON HCL 4 MG/2ML IJ SOLN
INTRAMUSCULAR | Status: AC
  Filled 2020-02-19: qty 4

## 2020-02-19 MED ORDER — SODIUM CHLORIDE 0.9% FLUSH
10.0000 mL | INTRAVENOUS | Status: DC | PRN
  Administered 2020-02-19: 10 mL
  Filled 2020-02-19: qty 10

## 2020-02-19 MED ORDER — SODIUM CHLORIDE 0.9 % IV SOLN
Freq: Once | INTRAVENOUS | Status: AC
  Filled 2020-02-19: qty 250

## 2020-02-19 NOTE — Progress Notes (Signed)
Per Dr. Truett Perna OK to treat with ALT 235 and AST 104

## 2020-02-19 NOTE — Patient Instructions (Signed)
[facility] Discharge Instructions for Patients Receiving Chemotherapy  Today you received the following chemotherapy agents: Abraxane and Gemzar  To help prevent nausea and vomiting after your treatment, we encourage you to take your nausea medication as prescribed.    If you develop nausea and vomiting that is not controlled by your nausea medication, call the clinic.   BELOW ARE SYMPTOMS THAT SHOULD BE REPORTED IMMEDIATELY:  *FEVER GREATER THAN 100.5 F  *CHILLS WITH OR WITHOUT FEVER  NAUSEA AND VOMITING THAT IS NOT CONTROLLED WITH YOUR NAUSEA MEDICATION  *UNUSUAL SHORTNESS OF BREATH  *UNUSUAL BRUISING OR BLEEDING  TENDERNESS IN MOUTH AND THROAT WITH OR WITHOUT PRESENCE OF ULCERS  *URINARY PROBLEMS  *BOWEL PROBLEMS  UNUSUAL RASH Items with * indicate a potential emergency and should be followed up as soon as possible.  Feel free to call the clinic should you have any questions or concerns. The clinic phone number is [phone].  Please show the CHEMO ALERT CARD at check-in to the Emergency Department and triage nurse.   

## 2020-02-20 LAB — PATHOLOGIST SMEAR REVIEW

## 2020-02-22 ENCOUNTER — Encounter: Payer: Self-pay | Admitting: Hematology and Oncology

## 2020-02-26 ENCOUNTER — Telehealth: Payer: Self-pay

## 2020-02-26 ENCOUNTER — Encounter: Payer: Self-pay | Admitting: Hematology and Oncology

## 2020-02-26 ENCOUNTER — Other Ambulatory Visit: Payer: BC Managed Care – PPO

## 2020-02-26 ENCOUNTER — Ambulatory Visit: Payer: BC Managed Care – PPO | Admitting: Hematology and Oncology

## 2020-02-26 ENCOUNTER — Telehealth (HOSPITAL_BASED_OUTPATIENT_CLINIC_OR_DEPARTMENT_OTHER): Payer: BC Managed Care – PPO | Admitting: Hematology and Oncology

## 2020-02-26 ENCOUNTER — Ambulatory Visit: Payer: BC Managed Care – PPO

## 2020-02-26 ENCOUNTER — Other Ambulatory Visit: Payer: Self-pay | Admitting: Hematology and Oncology

## 2020-02-26 DIAGNOSIS — R748 Abnormal levels of other serum enzymes: Secondary | ICD-10-CM | POA: Diagnosis not present

## 2020-02-26 DIAGNOSIS — C252 Malignant neoplasm of tail of pancreas: Secondary | ICD-10-CM | POA: Diagnosis not present

## 2020-02-26 DIAGNOSIS — G620 Drug-induced polyneuropathy: Secondary | ICD-10-CM

## 2020-02-26 DIAGNOSIS — J328 Other chronic sinusitis: Secondary | ICD-10-CM | POA: Diagnosis not present

## 2020-02-26 DIAGNOSIS — T451X5A Adverse effect of antineoplastic and immunosuppressive drugs, initial encounter: Secondary | ICD-10-CM

## 2020-02-26 DIAGNOSIS — L299 Pruritus, unspecified: Secondary | ICD-10-CM

## 2020-02-26 DIAGNOSIS — G893 Neoplasm related pain (acute) (chronic): Secondary | ICD-10-CM

## 2020-02-26 DIAGNOSIS — D801 Nonfamilial hypogammaglobulinemia: Secondary | ICD-10-CM

## 2020-02-26 MED ORDER — DOXYCYCLINE HYCLATE 100 MG PO TABS: 100.0000 mg | ORAL_TABLET | Freq: Two times a day (BID) | ORAL | 0 refills | Status: DC

## 2020-02-26 MED ORDER — ESTRADIOL 0.1 MG/GM VA CREA: 1.0000 | TOPICAL_CREAM | VAGINAL | 12 refills | Status: DC

## 2020-02-26 NOTE — Assessment & Plan Note (Signed)
Her cancer pain is well controlled She will continue current dose of prescribed pain medicine 

## 2020-02-26 NOTE — Assessment & Plan Note (Signed)
I do not believe this is due to side effects of treatment I recommend her to continue on topical steroid cream and Benadryl as needed

## 2020-02-26 NOTE — Progress Notes (Signed)
HEMATOLOGY-ONCOLOGY ELECTRONIC VISIT PROGRESS NOTE  Patient Care Team: [name], MD as PCP - General (Family Medicine)  I connected with by [facility] video conference and verified that I am speaking with the correct person using two identifiers.  I discussed the limitations, risks, security and privacy concerns of performing an evaluation and management service by EPIC and the availability of in person appointments.  I also discussed with the patient that there may be a patient responsible charge related to this service. The patient expressed understanding and agreed to proceed.   ASSESSMENT & PLAN:  Malignant neoplasm of tail of pancreas (HCC) Overall, she tolerated chemotherapy well However, I am concerned about her elevated tumor marker Her recent liver enzymes are grossly elevated but bilirubin was within normal limits I recommend taking a treatment break today We will resume chemotherapy next week I will see her back prior to cycle 3-day 8 of treatment It is very likely that we will have to modify her future treatment to days 1 and 8, rest day 15 for cycle of every 21 days I plan to repeat CT imaging after cycle 3  Elevated liver enzymes Her recent elevated liver enzymes are likely due to side effects of chemotherapy She is not symptomatic Watch closely for now  Sinusitis, chronic She has severe, intermittent flare of acute sinusitis I recommend another trial of antibiotics with doxycycline I plan to check her immunoglobulin levels next week If she has acquired hypogammaglobulinemia that is contributing to her recurrent sinusitis, I plan IVIG treatment She is in agreement  Peripheral neuropathy due to chemotherapy [facility]) She has slight worsening neuropathy in the tips of her fingers but not in her feet For now, she will continue same dose of gabapentin  Cancer associated pain Her cancer pain is well controlled She will continue current dose of prescribed pain  medicine  Scalp itch I do not believe this is due to side effects of treatment I recommend her to continue on topical steroid cream and Benadryl as needed   Orders Placed This Encounter  Procedures  . IgG, IgA, IgM    Standing Status:   Future    Standing Expiration Date:   02/25/2021    INTERVAL HISTORY: Please see below for problem oriented charting. The purpose of today's virtual visit is to address her recent abnormal liver enzymes and other issues She has been complaining of severe recurrent sinus pain especially in the frontal area She had profuse nasal drainage No recent fever She had pain with mastication last week She also complained of itchiness on her scalp Her pain medicine is controlling her chronic pain However, she noticed some slight worsening neuropathy especially at the tips of her fingers Denies recent changes in bowel habits or nausea   SUMMARY OF ONCOLOGIC HISTORY: Oncology History Overview Note  MSI positive  Endometrial :endometrioid Ovarian: 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 Mixed attenuation mass emanates from the right adnexa measuring 10.8 x 8.0 cm very suspicious for right ovarian carcinoma. 2. Abnormality of the tail of the pancreas may be due to mild pancreatitis and small pseudocyst formation, but neoplasm cannot be excluded. 3. Small amount of ascites within abdomen and pelvis. 4. Small nonobstructing renal calculi bilaterally.    08/30/2017 Pathology Results   1. Uterus and cervix, with left fallopian tube - ENDOMETRIOID ADENOCARCINOMA, FIGO GRADE I, ARISING IN A BACKGROUND  OF DIFFUSE COMPLEX ATYPICAL HYPERPLASIA. - CARCINOMA INVADES FOR OF DEPTH OF 0.2 CM WHERE THICKNESS OF MYOMETRIAL WALL IS 2.1 CM. - ALL RESECTION MARGINS ARE NEGATIVE FOR CARCINOMA. - NEGATIVE FOR LYMPHOVASCULAR OR PERINEURAL INVASION. -  CERVICAL STROMA IS NOT INVOLVED. - SEE ONCOLOGY TABLE. - SEE NOTE 2. Ovary and fallopian tube, right - PRIMARY OVARIAN ENDOMETRIOID ADENOCARCINOMA, FIGO GRADE II, 12 CM. - THE OVARIAN SURFACE IS FOCALLY INVOLVED BY CARCINOMA. - NEGATIVE FOR LYMPHOVASCULAR INVASION. - BENIGN UNREMARKABLE FALLOPIAN TUBE, NEGATIVE FOR CARCINOMA. - SEE ONCOLOGY TABLE. - SEE NOTE 3. Cul-de-sac biopsy - METASTATIC ADENOCARCINOMA, MOST CONSISTENT WITH PRIMARY OVARIAN ENDOMETRIOID ADENOCARCINOMA. 4. Ovary, left - BENIGN UNREMARKABLE OVARY, NEGATIVE FOR MALIGNANCY. Microscopic Comment 1. UTERUS, CARCINOMA OR CARCINOSARCOMA Procedure: Total hysterectomy with bilateral salpingo-oophorectomy. Histologic type: Endometrioid adenocarcinoma. Histologic Grade: FIGO Grade I Myometrial invasion: Depth of invasion: 2 mm Myometrial thickness: 21 mm Uterine Serosa Involvement: Not identified Cervical stromal involvement: Not identified Extent of involvement of other organs: Not applicable Lymphovascular invasion: Not identified Regional Lymph Nodes: Examined: 0 Sentinel 0 Non-sentinel 0 Total Tumor block for ancillary studies: 1H MMR / MSI testing: Pending Pathologic Stage Classification (pTNM, AJCC 8th edition): pT1a, pNX (v4.1.0.0) 2. OVARY or FALLOPIAN TUBE or PRIMARY PERITONEUM: Procedure: Salpingo-oophorectomy Specimen Integrity: Intact Tumor Site: Right ovary Ovarian Surface Involvement (required only if applicable): Focally involved by carcinoma Fallopian Tube Surface Involvement (required only if applicable): Not identified Tumor Size: 12 cm Histologic Type: Endometrioid adenocarcinoma Histologic Grade: Grade II Implants (required for advanced stage serous/seromucinous borderline tumors only): Not applicable Other Tissue/ Organ Involvement: Cul de sac biopsy involved by tumor Largest Extrapelvic Peritoneal Focus (required only if applicable): Not applicable Peritoneal/Ascitic Fluid: Negative for  carcinoma (case # [id number]) Treatment Effect (required only for high-grade serous carcinomas): Not applicable Regional Lymph Nodes: No lymph nodes submitted or found Number of Lymph Nodes Examined: 0 Pathologic Stage Classification (pTNM, AJCC 8th Edition): pT2b, pN0 Representative Tumor Block: 2B and 2E 1. Molecular study for microsatellite instability and immunohistochemical stains for MMR-related proteins are pending and will be reported in an addendum. 2. Immunohistochemical stain show that the ovarian tumor is positive for CK7 and PAX8 (both diffuse), CDX2 (patchy and weak); and negative for CK20. This immunoprofile is consistent with the above diagnosis. Dr. Lyndon Code has reviewed this case and concurs with the above diagnosis. Molecular study for microsatellite instability and immunohistochemical stains for MMR-related proteins are pending and will be reported in an addendum   08/30/2017 Genetic Testing   Patient has genetic testing done for MSI  Results revealed patient has the following mutation(s): loss of [facility] 2   08/30/2017 Surgery   Surgeon: Mart Piggs, MD Pre-operative Diagnosis:  1. Adnexal mass 2. Abnormal uterine bleeding 3. H/o Cecal CA  Post-operative Diagnosis:  1. Adhesive disease post colon resection 2. Endometrial cancer NOS 3. Adenocarcinoma unknown origin, right ovary, suspicious for GI primary  Operation:  1. Lysis of adhesions ~30 minutes 2. Robotic-assisted laparoscopic total hysterectomy with right salpingo-oophorectomy and left salpingectomy 3. Left oophorectomy (RA-laparoscopic) 4. Pelvic washings  Findings: Adhesions of omentum to anterior abdominal wall. Enlarged cystic right ovary ~10cm. Uterus had small nodules on serosa near where right adnexa was intimate with the surface. No obvious intraoperative rupture of cyst, although in 2 areas the wall was thin and one of these areas had some bleeding. Slight scarring of left bladder dome to  LUS/cervix. Uterus on frozen section c/w hyperplasia and a small focus of endometrial CA - no myo  invasion, <2cm in size. Frozen section on the right adnexa was carcinoma, met from colon or possibly Gyn, favor GI primary, defer to permanent. Left ovary was WNL.     09/15/2017 Cancer Staging   Staging form: Corpus Uteri - Carcinoma and Carcinosarcoma, AJCC 8th Edition - Pathologic: FIGO Stage IA (pT1a, pN0, cM0) - Signed by Heath Lark, MD on 09/15/2017   09/26/2017 Procedure   Successful placement of a right internal jugular approach power injectable Port-A-Cath. The catheter is ready for immediate use.   11/07/2017 Imaging   1. Since 08/18/2017, similar to slight decrease in size of a pancreatic body/tail junction lesion. Cross modality comparison relative to 09/16/2017 MRI is also grossly similar. 2. No evidence of metastatic disease. 3. Aortic Atherosclerosis (ICD10-I70.0).  4. Left nephrolithiasis.   11/18/2017 Genetic Testing   MSH2 c.2237dupT pathogenic mutation identified in the CancerNext panel.  The CancerNext gene panel offered by [employer] includes sequencing and rearrangement analysis for the following 34 genes:   APC, ATM, BARD1, BMPR1A, BRCA1, BRCA2, BRIP1, CDH1, CDK4, CDKN2A, CHEK2, DICER1, HOXB13, EPCAM, GREM1, MLH1, MRE11A, MSH2, MSH6, MUTYH, NBN, NF1, PALB2, PMS2, POLD1, POLE, PTEN, RAD50, RAD51C, RAD51D, SMAD4, SMARCA4, STK11, and TP53.  The report date is November 18, 2017.  MSH2 [id number] pathogenic mutation identified on somatic testing.  These results are consistent with a diagnosis of Lynch syndrome.   06/18/2019 - 08/14/2019 Chemotherapy   The patient had FOLFIRINOX for chemotherapy treatment.     01/19/2020 -  Chemotherapy   The patient had PACLitaxel-protein bound (ABRAXANE) chemo infusion 250 mg, 125 mg/m2 = 250 mg, Intravenous,  Once, 2 of 4 cycles Administration: 250 mg (01/19/2020), 250 mg (01/25/2020), 250 mg (02/01/2020), 250 mg (02/12/2020),  250 mg (02/19/2020) ondansetron (ZOFRAN) injection 8 mg, 8 mg (100 % of original dose 8 mg), Intravenous,  Once, 2 of 4 cycles Dose modification: 8 mg (original dose 8 mg, Cycle 1) Administration: 8 mg (01/19/2020), 8 mg (01/25/2020), 8 mg (02/01/2020), 8 mg (02/12/2020), 8 mg (02/19/2020) gemcitabine (GEMZAR) 2,000 mg in sodium chloride 0.9 % 250 mL chemo infusion, 2,052 mg, Intravenous,  Once, 2 of 4 cycles Administration: 2,000 mg (01/19/2020), 2,000 mg (01/25/2020), 2,000 mg (02/01/2020), 2,000 mg (02/12/2020), 2,000 mg (02/19/2020)  for chemotherapy treatment.    Secondary malignant neoplasm of right ovary ([place])  08/23/2017 Tumor Marker   Patient's tumor was tested for the following markers: CA-125 Results of the tumor marker test revealed 139.8   08/31/2017 Initial Diagnosis   Secondary malignant neoplasm of right ovary ([place])   09/15/2017 Cancer Staging   Staging form: Ovary, Fallopian Tube, and Primary Peritoneal Carcinoma, AJCC 8th Edition - Pathologic: Stage IIB (pT2b, pN0, cM0) - Signed by Heath Lark, MD on 09/15/2017   09/27/2017 Imaging   No evidence of metastatic disease or other acute findings within the thorax.  4 cm low-attenuation mass in pancreatic tail, highly suspicious for pancreatic carcinoma. This is caused splenic vein thrombosis, with new venous collaterals in the left upper quadrant. Consider endoscopic ultrasound with FNA for tissue diagnosis.  Stable benign hepatic hemangioma.    09/27/2017 Tumor Marker   Patient's tumor was tested for the following markers: CA-125 Results of the tumor marker test revealed 39.4   11/02/2017 Tumor Marker   Patient's tumor was tested for the following markers: CA-125 Results of the tumor marker test revealed 19   11/07/2017 Tumor Marker   Patient's tumor was tested for the following markers: CA-125 Results of the tumor marker test revealed 18.3  11/18/2017 Genetic Testing   MSH2 c.2237dupT pathogenic mutation identified in  the CancerNext panel.  The CancerNext gene panel offered by [employer] includes sequencing and rearrangement analysis for the following 34 genes:   APC, ATM, BARD1, BMPR1A, BRCA1, BRCA2, BRIP1, CDH1, CDK4, CDKN2A, CHEK2, DICER1, HOXB13, EPCAM, GREM1, MLH1, MRE11A, MSH2, MSH6, MUTYH, NBN, NF1, PALB2, PMS2, POLD1, POLE, PTEN, RAD50, RAD51C, RAD51D, SMAD4, SMARCA4, STK11, and TP53.  The report date is November 18, 2017.  MSH2 [id number] pathogenic mutation identified on somatic testing.  These results are consistent with a diagnosis of Lynch syndrome.   12/15/2017 Tumor Marker   Patient's tumor was tested for the following markers: CA-125 Results of the tumor marker test revealed 57.3   01/16/2018 Tumor Marker   Patient's tumor was tested for the following markers: CA-125 Results of the tumor marker test revealed 24.2   04/10/2018 Tumor Marker   Patient's tumor was tested for the following markers: CA-125 Results of the tumor marker test revealed 18.2   07/12/2018 Tumor Marker   Patient's tumor was tested for the following markers: CA-125 Results of the tumor marker test revealed 11.8   10/16/2018 Tumor Marker   Patient's tumor was tested for the following markers: CA125 Results of the tumor marker test revealed 12.4.   01/18/2020 Tumor Marker   Patient's tumor was tested for the following markers: CA-125 Results of the tumor marker test revealed 18   Malignant neoplasm of tail of pancreas ([place])  10/13/2017 Pathology Results   Pancreas tail mass, endoscopic ultrasound-guided, fine needle aspiration II (smears and cell block): Adenocarcinoma   11/18/2017 Genetic Testing   MSH2 c.2237dupT pathogenic mutation identified in the CancerNext panel.  The CancerNext gene panel offered by [employer] includes sequencing and rearrangement analysis for the following 34 genes:   APC, ATM, BARD1, BMPR1A, BRCA1, BRCA2, BRIP1, CDH1, CDK4, CDKN2A, CHEK2, DICER1, HOXB13, EPCAM,  GREM1, MLH1, MRE11A, MSH2, MSH6, MUTYH, NBN, NF1, PALB2, PMS2, POLD1, POLE, PTEN, RAD50, RAD51C, RAD51D, SMAD4, SMARCA4, STK11, and TP53.  The report date is November 18, 2017.  MSH2 [id number] pathogenic mutation identified on somatic testing.  These results are consistent with a diagnosis of Lynch syndrome.   11/24/2017 Pathology Results   A. "TAIL OF PANCREAS AND SPLEEN", DISTAL PANCREATECTOMY AND SPLENECTOMY: Invasive ductal adenocarcinoma, moderately to poorly differentiated with focal signet ring cell features, of pancreas (distal).   The carcinoma is 2.5 cm in greatest dimension grossly.   Treatment effects present in the form of fibrosis (50%). No lymphovascular or definite perineural invasion identified. All surgical margins are negative for tumor or high-grade dysplasia. Adjacent mucinous neoplasm, most consistent with Intraductal papillary mucinous neoplasm (IPMN) with low-grade dysplasia.   Uninvolved pancreas show atrophy and focal acute inflammation.   Twelve benign lymph nodes (0/12). Spleen with no significant histopathologic abnormalities.  PROCEDURE: distal pancreatectomy and splenectomy TUMOR SITE: distal pancreas TUMOR SIZE:  GREATEST DIMENSION: 2.5 cm  ADDITIONAL DIMENSIONS:  x  cm HISTOLOGIC TYPE: ductal adenocarcinoma HISTOLOGIC GRADE: grade 3 TUMOR EXTENSION: peripancreatic soft tissue MARGINS: negative for tumor TREATMENT EFFECT: treatment effects present in the form of fibrosis (50%). LYMPHOVASCULAR INVASION: not identified PERINEURAL INVASION: no definite evidence REGIONAL LYMPH NODES:   NUMBER OF LYMPH NODES INVOLVED: 0   NUMBER OF LYMPH NODES EXAMINED: 12 PATHOLOGIC STAGE CLASSIFICATION (pTNM, AJCC 8th Ed): ypT2, ypN0 DISTANT METASTASIS (pM): pMx ADDITIONAL PATHOLOGIC FINDINGS: mucinous neoplasm, most consistent with intraductal papillary mucinous neoplasm (IPMN) with low-grade  dysplasia, is identified adjacent to the main tumor.  11/24/2017 Cancer Staging   Staging form: Exocrine Pancreas, AJCC 8th Edition - Pathologic stage from 11/24/2017: Stage IB (pT2, pN0, cM0) - Signed by Truitt Merle, MD on 01/03/2018   11/25/2017 Surgery   She had surgery at [facility] 1. Exploratory Laparotomy 2. Distal Pancreatectomy and Splenectomy 3. Intraoperative Ultrasound 4. Open Cholecystectomy    12/15/2017 Cancer Staging   Staging form: Exocrine Pancreas, AJCC 8th Edition - Clinical: Stage IB (cT2, cN0, cM0) - Signed by Heath Lark, MD on 12/15/2017   12/28/2017 Imaging   12/28/2017 CT Abdomen  IMPRESSION: 1. Postoperative findings from recent partial pancreatectomy including a 21 cubic cm fluid collection along the pancreatic resection margin which could represent early pseudocyst. 2. Nodularity along the lateral limb of the left adrenal gland could also be postoperative but merit surveillance, as the pancreatic lesion was in close proximity to this adrenal gland on the prior CT of 11/07/2017. 3. Asymmetric fullness inferiorly in the left breast. The patient has a history of prior left breast procedures, correlation with mammography is recommended. 4. Other imaging findings of potential clinical significance: Stable hemangioma in the left hepatic lobe. Splenectomy. Aortic Atherosclerosis (ICD10-I70.0). Right hemicolectomy. Small focus of fat necrosis in the right anterior abdominal wall subcutaneous tissues near the laparotomy site. Bilateral nonobstructive nephrolithiasis.   01/02/2018 Tumor Marker   Patient's tumor was tested for the following markers: CA-19-9 Results of the tumor marker test revealed 5   01/03/2018 - 06/05/2018 Chemotherapy   She received modified dose FOLFIRINOX   04/10/2018 Imaging   1. Distal pancreatectomy, without findings of recurrent or metastatic disease. 2. Incompletely imaged hypoenhancement within lower lobe right pulmonary artery branch is  likely chronic (but interval since 10/09/2017) pulmonary embolism. Dedicated CTA could further evaluate. 3. Decrease in size of a peripancreatic complex fluid collection anteriorly, likely a resolving pseudocyst. 4. Decreased size of minimal fluid versus a borderline sized node in the gastrohepatic ligament. Recommend attention on follow-up. 5. Hepatic steatosis with a segment 4 hemangioma. 6. Aortic Atherosclerosis (ICD10-I70.0). This is significantly age advanced.   07/12/2018 Tumor Marker   Patient's tumor was tested for the following markers: CA-19-9 Results of the tumor marker test revealed 6   07/12/2018 Imaging   1. Status post distal pancreatectomy with stable postoperative fluid collection adjacent to the ventral aspect of the pancreatic head. No findings to suggest metastatic disease in the abdomen or pelvis. 2. Hepatic steatosis with small cavernous hemangioma in segment 4A of the liver. 3. Slight decreased size of nonenlarged gastrohepatic ligament lymph node, presumably benign. 4. Aortic atherosclerosis.    10/16/2018 Imaging   Status post distal pancreatectomy. Stable postoperative seroma along the surgical margin.   Status post hysterectomy and right oophorectomy.   Status post right hemicolectomy with appendectomy.   No evidence of recurrent or metastatic disease.   No colonic wall thickening or mass is evident on CT.   01/22/2019 Tumor Marker   Patient's tumor was tested for the following markers: CA-19.9 Results of the tumor marker test revealed 5   01/22/2019 Imaging   1. No evidence of local recurrence or metastatic disease status post distal pancreatectomy and splenectomy. 2. Stable small seroma anterior to the pancreatic head. 3. Postsurgical changes as described. 4. Stable additional incidental findings including a hepatic hemangioma, nonobstructing bilateral renal calculi and aortic Atherosclerosis (ICD10-I70.0).   04/18/2019 Imaging   1. Status post  distal pancreatectomy and splenectomy. There has been a gradual increase in ill-defined soft tissue and celiac axis/SMA origin lymph nodes  adjacent to the distal pancreatectomy margin and lesser curvature of the stomach/gastric body over sequential prior examinations dated 01/22/2019 and 09/26/2018. An enlarged lymph node or soft tissue nodule adjacent to the SMA origin now measures 1.8 x 0.8 cm. Findings are concerning for local recurrence of pancreatic malignancy. 2. Separately from the above findings, there remains a 1.0 cm low-attenuation nodule anterior to the remnant pancreatic neck, most consistent with postoperative seroma 3. No evidence of distant metastatic disease in the chest, abdomen, or pelvis. 4. Postoperative findings of cholecystectomy, right hemicolectomy, and hysterectomy. 5. Bilateral nonobstructive nephrolithiasis. 6.  Aortic Atherosclerosis (ICD10-I70.0).       05/28/2019 Tumor Marker   Patient's tumor was tested for the following markers: CA19-9 Results of the tumor marker test revealed 18   05/28/2019 Imaging   1. Status post distal pancreatectomy with continued further progression of the ill-defined soft tissue to the left of the celiac axis/SMA origin, now measuring 1.9 x 1.9 cm and highly concerning for recurrent/metastatic disease. This process abuts both the celiac axis, SMA, and posterior wall of the mid stomach. PET-CT may prove helpful to further evaluate. 2. Bandlike ill-defined soft tissue in the anterior abdomen, potentially peritoneal or omental is similar to perhaps minimally increased qualitatively in the interval. Close attention on follow-up recommended. 3. Left paraumbilical hernia contains a short segment of small bowel without complicating features. 4. Ill-defined very subtle area of decreased attenuation in the posterior aspect of hepatic segment IV. This is likely focal fatty deposition. Close attention on follow-up recommended. 5.  Aortic Atherosclerois  (ICD10-170.0)   06/07/2019 Pathology Results   Malignant cells consistent with adenocarcinoma   06/07/2019 Procedure   ENDOSCOPIC FINDING (limited views with linear echoendoscope): :      The examined esophagus was endoscopically normal.      The entire examined stomach was endoscopically normal.      ENDOSONOGRAPHIC FINDING: :      1. An irregular mass was identified in the region of the pancreatic tail resection site. The mass was stellate with very poorly defined borders. Fine needle aspiration for cytology was performed. Color Doppler imaging was utilized prior to needle puncture to confirm a lack of significant vascular structures within the needle path. Four passes were made with the 25 gauge (FNB) needle using a transgastric approach. A cytotechnologist was present to evaluate the adequacy of the specimen. Final cytology results are pending. Impression:               -  Irregular, stellate soft tissue mass in the region of the pancreatic tail resection site. This was sampled with four transgastric passes with an EUS FNB needle..   06/18/2019 - 08/14/2019 Chemotherapy   The patient had FOLFIRINOX for chemotherapy treatment.     09/03/2019 Imaging   1. Findings are again highly concerning for locally recurrent disease adjacent to the pancreatectomy bed, with enlarging soft tissue mass which is intimately associated with the superior mesenteric artery, celiac axis and superior aspect of the left renal  vein, as detailed above. 2. No evidence of metastatic disease in the thorax. 3. 2.7 x 1.2 cm cavernous hemangioma in segment 4A of the liver again noted. 4. Small nonobstructive calculi in the collecting systems of both kidneys measuring up to 3 mm in the lower pole collecting system of the left kidney. 5. Aortic atherosclerosis. 6. Additional incidental findings, as above.   11/16/2019 Imaging   Similar-appearing ill-defined soft tissue the left of the celiac axis/SMA, most compatible with  recurrent/metastatic disease.   Similar ill-defined nodularity within the anterior upper abdomen, potentially peritoneal or omental. Continued attention on follow-up is recommended.   12/31/2019 Tumor Marker   Patient's tumor was tested for the following markers: CA-19-9 Results of the tumor marker test revealed 298   01/10/2020 Imaging   1. Numerous small hypoenhancing liver masses scattered throughout the liver, all new since 11/16/2019 CT, compatible with new liver metastases. 2. Stable upper retroperitoneal mass encasing the celiac axis and abutting the pancreatic resection margin, compatible with stable locally recurrent tumor. 3. Stable subcentimeter indistinct anterior upper peritoneal implants. 4. No evidence of metastatic disease in the chest. 5. Chronic findings include: Nonobstructing left nephrolithiasis. Stable small left paraumbilical hernia containing a small bowel loop without acute bowel complication. Aortic Atherosclerosis (ICD10-I70.0).     01/19/2020 -  Chemotherapy   The patient had PACLitaxel-protein bound (ABRAXANE) chemo infusion 250 mg, 125 mg/m2 = 250 mg, Intravenous,  Once, 2 of 4 cycles Administration: 250 mg (01/19/2020), 250 mg (01/25/2020), 250 mg (02/01/2020), 250 mg (02/12/2020), 250 mg (02/19/2020) ondansetron (ZOFRAN) injection 8 mg, 8 mg (100 % of original dose 8 mg), Intravenous,  Once, 2 of 4 cycles Dose modification: 8 mg (original dose 8 mg, Cycle 1) Administration: 8 mg (01/19/2020), 8 mg (01/25/2020), 8 mg (02/01/2020), 8 mg (02/12/2020), 8 mg (02/19/2020) gemcitabine (GEMZAR) 2,000 mg in sodium chloride 0.9 % 250 mL chemo infusion, 2,052 mg, Intravenous,  Once, 2 of 4 cycles Administration: 2,000 mg (01/19/2020), 2,000 mg (01/25/2020), 2,000 mg (02/01/2020), 2,000 mg (02/12/2020), 2,000 mg (02/19/2020)  for chemotherapy treatment.    01/25/2020 Tumor Marker   Patient's tumor was tested for the following markers: CA-19-9 Results of the tumor marker  test revealed 1104.     REVIEW OF SYSTEMS:   Constitutional: Denies fevers, chills or abnormal weight loss Eyes: Denies blurriness of vision Ears, nose, mouth, throat, and face: Denies mucositis or sore throat Respiratory: Denies cough, dyspnea or wheezes Cardiovascular: Denies palpitation, chest discomfort Gastrointestinal:  Denies nausea, heartburn or change in bowel habits Lymphatics: Denies new lymphadenopathy or easy bruising Behavioral/Psych: Mood is stable, no new changes  Extremities: No lower extremity edema All other systems were reviewed with the patient and are negative.  I have reviewed the past medical history, past surgical history, social history and family history with the patient and they are unchanged from previous note.  ALLERGIES:  is allergic to codeine, penicillins, and bactrim [sulfamethoxazole-trimethoprim].  MEDICATIONS:  Current Outpatient Medications  Medication Sig Dispense Refill  . doxycycline (VIBRA-TABS) 100 MG tablet Take 1 tablet (100 mg total) by mouth 2 (two) times daily. 28 tablet 0  . cetirizine (ZYRTEC) 10 MG tablet Take 10 mg by mouth daily.    . citalopram (CELEXA) 20 MG tablet TAKE 1 TABLET BY MOUTH EVERY DAY 90 tablet 4  . CREON 24000-76000 units CPEP Take 1 capsule by mouth See admin instructions. Take 1 capsule before each meal and 1 capsule after each meal, take 1 cap with snacks    . diclofenac Sodium (VOLTAREN) 1 % GEL APPLY 2-4 GRAMS TO AFFECTED JOINT 4 TIMES DAILY AS NEEDED. 400 g 1  . [START ON 02/27/2020] estradiol (ESTRACE VAGINAL) 0.1 MG/GM vaginal cream Place 1 Applicatorful vaginally 3 (three) times a week. 42.5 g 12  . fluticasone (FLONASE) 50 MCG/ACT nasal spray Place 1 spray into both nostrils daily.    Marland Kitchen gabapentin (NEURONTIN) 300 MG capsule Take 1 capsule (300 mg total) by mouth 3 (three) times daily. [place]  capsule 11  . HYDROmorphone (DILAUDID) 4 MG tablet Take 1 tablet (4 mg total) by mouth every 4 (four) hours as needed. 60  tablet 0  . hydrOXYzine (ATARAX/VISTARIL) 10 MG tablet Take 1 tablet (10 mg total) by mouth 3 (three) times daily as needed. 30 tablet 1  . lidocaine-prilocaine (EMLA) cream Apply 1 application topically daily as needed (port). Apply to affected area once 30 g 11  . LORazepam (ATIVAN) 0.5 MG tablet Take 1 tablet (0.5 mg total) by mouth every 8 (eight) hours as needed for anxiety (nausea). 30 tablet 0  . metFORMIN (GLUCOPHAGE) 500 MG tablet TAKE 1 TABLET BY MOUTH TWICE A DAY WITH A MEAL 180 tablet 11  . methocarbamol (ROBAXIN) 500 MG tablet TAKE 1 TABLET BY MOUTH THREE TIMES A DAY AS NEEDED FOR MUSCLE SPASMS 90 tablet 0  . Multiple Vitamin (MULTIVITAMIN WITH MINERALS) TABS tablet Take 1 tablet by mouth daily.    Marland Kitchen omeprazole (PRILOSEC) 40 MG capsule Take 1 capsule (40 mg total) by mouth daily. 30 capsule 11  . ondansetron (ZOFRAN) 8 MG tablet TAKE 1 TAB BY MOUTH EVERY 8HOURS AS NEEDED FOR REFRACTORY NAUSEA/VOMITING START ON DAY 3AFTER CHEMO 8 tablet 7  . predniSONE (DELTASONE) 5 MG tablet TAKE 2 TABLETS BY MOUTH DAILY WITH BREAKFAST. 60 tablet 2  . prochlorperazine (COMPAZINE) 10 MG tablet TAKE 1 TABLET BY MOUTH EVERY 6 HOURS AS NEEDED 90 tablet 3  . rivaroxaban (XARELTO) 20 MG TABS tablet Take 1 tablet (20 mg total) by mouth daily with supper. 30 tablet 9  . simethicone (MYLICON) 383 MG chewable tablet Chew 125 mg by mouth every 6 (six) hours as needed for flatulence.     No current facility-administered medications for this visit.    PHYSICAL EXAMINATION: ECOG PERFORMANCE STATUS: 1 - Symptomatic but completely ambulatory  LABORATORY DATA:  I have reviewed the data as listed CMP Latest Ref Rng & Units 02/19/2020 02/12/2020 02/01/2020  Glucose 70 - 99 mg/dL 162(H) 80 107(H)  BUN 6 - 20 mg/dL '13 13 11  ' Creatinine 0.44 - 1.00 mg/dL 0.71 0.63 0.74  Sodium 135 - 145 mmol/L 140 140 138  Potassium 3.5 - 5.1 mmol/L 4.0 4.2 4.1  Chloride 98 - 111 mmol/L 106 106 105  CO2 22 - 32 mmol/L '26 24 24   ' Calcium 8.9 - 10.3 mg/dL 8.5(L) 9.3 9.1  Total Protein 6.5 - 8.1 g/dL 6.5 7.1 7.4  Total Bilirubin 0.3 - 1.2 mg/dL <0.2(L) <0.2(L) <0.2(L)  Alkaline Phos 38 - 126 U/L 88 99 97  AST 15 - 41 U/L 104(H) 52(H) 29  ALT 0 - 44 U/L 235(H) 124(H) 55(H)    Lab Results  Component Value Date   WBC 6.7 02/19/2020   HGB 10.0 (L) 02/19/2020   HCT 32.1 (L) 02/19/2020   MCV 92.8 02/19/2020   PLT 740 (H) 02/19/2020   NEUTROABS 2.0 02/19/2020     RADIOGRAPHIC STUDIES: I have personally reviewed the radiological images as listed and agreed with the findings in the report. DG Chest 2 View  Result Date: 01/28/2020 CLINICAL DATA:  Fever.  No chest complaints. EXAM: CHEST - 2 VIEW COMPARISON:  CT chest dated January 10, 2020. FINDINGS: Unchanged right chest wall port catheter. The heart size and mediastinal contours are within normal limits. Normal pulmonary vascularity. No focal consolidation, pleural effusion, or pneumothorax. No acute osseous abnormality. IMPRESSION: No active cardiopulmonary disease. Electronically Signed   By: Titus Dubin M.D.   On: 01/28/2020 12:25  I discussed the assessment and treatment plan with the patient. The patient was provided an opportunity to ask questions and all were answered. The patient agreed with the plan and demonstrated an understanding of the instructions. The patient was advised to call back or seek an in-person evaluation if the symptoms worsen or if the condition fails to improve as anticipated.    I spent 30 minutes for the appointment reviewing test results, discuss management and coordination of care.  Heath Lark, MD 02/26/2020 2:56 PM

## 2020-02-26 NOTE — Telephone Encounter (Signed)
Called and left a message asking her to call the office back.. Dr. Bertis Ruddy is recommending hold Rx today. if she wants to discuss we can just do e-visit. if she is ok, we can cancel today but add MD visit to next week's appt.

## 2020-02-26 NOTE — Assessment & Plan Note (Signed)
Overall, she tolerated chemotherapy well However, I am concerned about her elevated tumor marker Her recent liver enzymes are grossly elevated but bilirubin was within normal limits I recommend taking a treatment break today We will resume chemotherapy next week I will see her back prior to cycle 3-day 8 of treatment It is very likely that we will have to modify her future treatment to days 1 and 8, rest day 15 for cycle of every 21 days I plan to repeat CT imaging after cycle 3

## 2020-02-26 NOTE — Assessment & Plan Note (Signed)
She has severe, intermittent flare of acute sinusitis I recommend another trial of antibiotics with doxycycline I plan to check her immunoglobulin levels next week If she has acquired hypogammaglobulinemia that is contributing to her recurrent sinusitis, I plan IVIG treatment She is in agreement

## 2020-02-26 NOTE — Assessment & Plan Note (Signed)
She has slight worsening neuropathy in the tips of her fingers but not in her feet For now, she will continue same dose of gabapentin

## 2020-02-26 NOTE — Telephone Encounter (Signed)
She is agreeable to cancel treatment today. Today's appt changed to mychart visit.

## 2020-02-26 NOTE — Assessment & Plan Note (Signed)
Her recent elevated liver enzymes are likely due to side effects of chemotherapy She is not symptomatic Watch closely for now

## 2020-03-04 ENCOUNTER — Inpatient Hospital Stay: Payer: BC Managed Care – PPO

## 2020-03-04 ENCOUNTER — Inpatient Hospital Stay: Payer: BC Managed Care – PPO | Attending: Hematology and Oncology

## 2020-03-04 ENCOUNTER — Other Ambulatory Visit: Payer: Self-pay

## 2020-03-04 VITALS — BP 135/84 | HR 59 | Temp 98.6°F | Resp 18

## 2020-03-04 DIAGNOSIS — C541 Malignant neoplasm of endometrium: Secondary | ICD-10-CM

## 2020-03-04 DIAGNOSIS — D6481 Anemia due to antineoplastic chemotherapy: Secondary | ICD-10-CM | POA: Insufficient documentation

## 2020-03-04 DIAGNOSIS — Z7189 Other specified counseling: Secondary | ICD-10-CM

## 2020-03-04 DIAGNOSIS — C252 Malignant neoplasm of tail of pancreas: Secondary | ICD-10-CM

## 2020-03-04 DIAGNOSIS — D801 Nonfamilial hypogammaglobulinemia: Secondary | ICD-10-CM

## 2020-03-04 DIAGNOSIS — Z79899 Other long term (current) drug therapy: Secondary | ICD-10-CM | POA: Diagnosis not present

## 2020-03-04 DIAGNOSIS — Z5111 Encounter for antineoplastic chemotherapy: Secondary | ICD-10-CM | POA: Diagnosis not present

## 2020-03-04 DIAGNOSIS — C7961 Secondary malignant neoplasm of right ovary: Secondary | ICD-10-CM

## 2020-03-04 LAB — CMP (CANCER CENTER ONLY)
ALT: 62 U/L — ABNORMAL HIGH (ref 0–44)
AST: 29 U/L (ref 15–41)
Albumin: 3.3 g/dL — ABNORMAL LOW (ref 3.5–5.0)
Alkaline Phosphatase: 89 U/L (ref 38–126)
Anion gap: 7 (ref 5–15)
BUN: 13 mg/dL (ref 6–20)
CO2: 27 mmol/L (ref 22–32)
Calcium: 8.7 mg/dL — ABNORMAL LOW (ref 8.9–10.3)
Chloride: 103 mmol/L (ref 98–111)
Creatinine: 0.67 mg/dL (ref 0.44–1.00)
GFR, Estimated: >60 mL/min (ref >60)
Glucose, Bld: 113 mg/dL — ABNORMAL HIGH (ref 70–99)
Potassium: 4.3 mmol/L (ref 3.5–5.1)
Sodium: 137 mmol/L (ref 135–145)
Total Bilirubin: 0.2 mg/dL — ABNORMAL LOW (ref 0.3–1.2)
Total Protein: 6.7 g/dL (ref 6.5–8.1)

## 2020-03-04 LAB — CBC WITH DIFFERENTIAL (CANCER CENTER ONLY)
Abs Immature Granulocytes: 0.1 10*3/uL — ABNORMAL HIGH (ref 0.00–0.07)
Basophils Absolute: 0.1 10*3/uL (ref 0.0–0.1)
Basophils Relative: 1 %
Eosinophils Absolute: 0.1 10*3/uL (ref 0.0–0.5)
Eosinophils Relative: 1 %
HCT: 34 % — ABNORMAL LOW (ref 36.0–46.0)
Hemoglobin: 10.7 g/dL — ABNORMAL LOW (ref 12.0–15.0)
Immature Granulocytes: 1 %
Lymphocytes Relative: 13 %
Lymphs Abs: 1.5 10*3/uL (ref 0.7–4.0)
MCH: 29.5 pg (ref 26.0–34.0)
MCHC: 31.5 g/dL (ref 30.0–36.0)
MCV: 93.7 fL (ref 80.0–100.0)
Monocytes Absolute: 1.1 10*3/uL — ABNORMAL HIGH (ref 0.1–1.0)
Monocytes Relative: 9 %
Neutro Abs: 8.8 10*3/uL — ABNORMAL HIGH (ref 1.7–7.7)
Neutrophils Relative %: 75 %
Platelet Count: 500 10*3/uL — ABNORMAL HIGH (ref 150–400)
RBC: 3.63 MIL/uL — ABNORMAL LOW (ref 3.87–5.11)
RDW: 18.8 % — ABNORMAL HIGH (ref 11.5–15.5)
WBC Count: 11.6 10*3/uL — ABNORMAL HIGH (ref 4.0–10.5)
nRBC: 0 % (ref 0.0–0.2)

## 2020-03-04 MED ORDER — ONDANSETRON HCL 4 MG/2ML IJ SOLN
8.0000 mg | Freq: Once | INTRAMUSCULAR | Status: AC
  Administered 2020-03-04: 8 mg via INTRAVENOUS

## 2020-03-04 MED ORDER — HEPARIN SOD (PORK) LOCK FLUSH 100 UNIT/ML IV SOLN
500.0000 [IU] | Freq: Once | INTRAVENOUS | Status: AC | PRN
  Administered 2020-03-04: 500 [IU]
  Filled 2020-03-04: qty 5

## 2020-03-04 MED ORDER — SODIUM CHLORIDE 0.9% FLUSH
10.0000 mL | INTRAVENOUS | Status: DC | PRN
  Administered 2020-03-04: 10 mL
  Filled 2020-03-04: qty 10

## 2020-03-04 MED ORDER — SODIUM CHLORIDE 0.9 % IV SOLN
Freq: Once | INTRAVENOUS | Status: AC
  Filled 2020-03-04: qty 250

## 2020-03-04 MED ORDER — ONDANSETRON HCL 4 MG/2ML IJ SOLN
INTRAMUSCULAR | Status: AC
  Filled 2020-03-04: qty 4

## 2020-03-04 MED ORDER — GEMCITABINE HCL CHEMO INJECTION 1 GM/26.3ML
2000.0000 mg | Freq: Once | INTRAVENOUS | Status: AC
  Administered 2020-03-04: 2000 mg via INTRAVENOUS
  Filled 2020-03-04: qty 52.6

## 2020-03-04 MED ORDER — PACLITAXEL PROTEIN-BOUND CHEMO INJECTION 100 MG
125.0000 mg/m2 | Freq: Once | INTRAVENOUS | Status: AC
  Administered 2020-03-04: 250 mg via INTRAVENOUS
  Filled 2020-03-04: qty 50

## 2020-03-04 NOTE — Patient Instructions (Signed)
[facility] Discharge Instructions for Patients Receiving Chemotherapy  Today you received the following chemotherapy agents abraxane, gemcitabine  To help prevent nausea and vomiting after your treatment, we encourage you to take your nausea medication as directed. If you develop nausea and vomiting that is not controlled by your nausea medication, call the clinic.   BELOW ARE SYMPTOMS THAT SHOULD BE REPORTED IMMEDIATELY:  *FEVER GREATER THAN 100.5 F  *CHILLS WITH OR WITHOUT FEVER  NAUSEA AND VOMITING THAT IS NOT CONTROLLED WITH YOUR NAUSEA MEDICATION  *UNUSUAL SHORTNESS OF BREATH  *UNUSUAL BRUISING OR BLEEDING  TENDERNESS IN MOUTH AND THROAT WITH OR WITHOUT PRESENCE OF ULCERS  *URINARY PROBLEMS  *BOWEL PROBLEMS  UNUSUAL RASH Items with * indicate a potential emergency and should be followed up as soon as possible.  Feel free to call the clinic should you have any questions or concerns. The clinic phone number is [phone].  Please show the CHEMO ALERT CARD at check-in to the Emergency Department and triage nurse.   

## 2020-03-05 ENCOUNTER — Telehealth: Payer: Self-pay

## 2020-03-05 LAB — CA 125: Cancer Antigen (CA) 125: 14.6 U/mL (ref 0.0–38.1)

## 2020-03-05 LAB — CANCER ANTIGEN 19-9: CA 19-9: 581 U/mL — ABNORMAL HIGH (ref 0–35)

## 2020-03-05 LAB — IGG, IGA, IGM
IgA: 200 mg/dL (ref 87–352)
IgG (Immunoglobin G), Serum: 679 mg/dL (ref 586–1602)
IgM (Immunoglobulin M), Srm: 20 mg/dL — ABNORMAL LOW (ref 26–217)

## 2020-03-05 NOTE — Telephone Encounter (Signed)
Pt notified of CA-19-9 results.

## 2020-03-05 NOTE — Telephone Encounter (Signed)
Left message for patient to call back, Re:Labs.  

## 2020-03-07 ENCOUNTER — Other Ambulatory Visit: Payer: Self-pay | Admitting: Hematology and Oncology

## 2020-03-11 ENCOUNTER — Encounter: Payer: Self-pay | Admitting: Hematology and Oncology

## 2020-03-11 ENCOUNTER — Other Ambulatory Visit: Payer: Self-pay

## 2020-03-11 ENCOUNTER — Inpatient Hospital Stay: Payer: BC Managed Care – PPO

## 2020-03-11 ENCOUNTER — Inpatient Hospital Stay (HOSPITAL_BASED_OUTPATIENT_CLINIC_OR_DEPARTMENT_OTHER): Payer: BC Managed Care – PPO | Admitting: Hematology and Oncology

## 2020-03-11 VITALS — BP 136/91 | HR 97 | Temp 99.7°F | Resp 18 | Ht 62.0 in | Wt 217.6 lb

## 2020-03-11 DIAGNOSIS — G620 Drug-induced polyneuropathy: Secondary | ICD-10-CM

## 2020-03-11 DIAGNOSIS — C252 Malignant neoplasm of tail of pancreas: Secondary | ICD-10-CM

## 2020-03-11 DIAGNOSIS — C7961 Secondary malignant neoplasm of right ovary: Secondary | ICD-10-CM

## 2020-03-11 DIAGNOSIS — T451X5A Adverse effect of antineoplastic and immunosuppressive drugs, initial encounter: Secondary | ICD-10-CM

## 2020-03-11 DIAGNOSIS — R748 Abnormal levels of other serum enzymes: Secondary | ICD-10-CM

## 2020-03-11 DIAGNOSIS — D6481 Anemia due to antineoplastic chemotherapy: Secondary | ICD-10-CM

## 2020-03-11 DIAGNOSIS — C541 Malignant neoplasm of endometrium: Secondary | ICD-10-CM

## 2020-03-11 DIAGNOSIS — Z79899 Other long term (current) drug therapy: Secondary | ICD-10-CM | POA: Diagnosis not present

## 2020-03-11 DIAGNOSIS — J328 Other chronic sinusitis: Secondary | ICD-10-CM

## 2020-03-11 DIAGNOSIS — G893 Neoplasm related pain (acute) (chronic): Secondary | ICD-10-CM | POA: Diagnosis not present

## 2020-03-11 DIAGNOSIS — Z7189 Other specified counseling: Secondary | ICD-10-CM

## 2020-03-11 DIAGNOSIS — Z5111 Encounter for antineoplastic chemotherapy: Secondary | ICD-10-CM | POA: Diagnosis not present

## 2020-03-11 LAB — CMP (CANCER CENTER ONLY)
ALT: 156 U/L — ABNORMAL HIGH (ref 0–44)
AST: 111 U/L — ABNORMAL HIGH (ref 15–41)
Albumin: 3.6 g/dL (ref 3.5–5.0)
Alkaline Phosphatase: 78 U/L (ref 38–126)
Anion gap: 10 (ref 5–15)
BUN: 13 mg/dL (ref 6–20)
CO2: 27 mmol/L (ref 22–32)
Calcium: 8.9 mg/dL (ref 8.9–10.3)
Chloride: 100 mmol/L (ref 98–111)
Creatinine: 0.66 mg/dL (ref 0.44–1.00)
GFR, Estimated: >60 mL/min (ref >60)
Glucose, Bld: 142 mg/dL — ABNORMAL HIGH (ref 70–99)
Potassium: 4.3 mmol/L (ref 3.5–5.1)
Sodium: 137 mmol/L (ref 135–145)
Total Bilirubin: 0.2 mg/dL — ABNORMAL LOW (ref 0.3–1.2)
Total Protein: 7 g/dL (ref 6.5–8.1)

## 2020-03-11 LAB — CBC WITH DIFFERENTIAL (CANCER CENTER ONLY)
Abs Immature Granulocytes: 0.5 10*3/uL — ABNORMAL HIGH (ref 0.00–0.07)
Basophils Absolute: 0.2 10*3/uL — ABNORMAL HIGH (ref 0.0–0.1)
Basophils Relative: 3 %
Eosinophils Absolute: 0 10*3/uL (ref 0.0–0.5)
Eosinophils Relative: 1 %
HCT: 32 % — ABNORMAL LOW (ref 36.0–46.0)
Hemoglobin: 10.4 g/dL — ABNORMAL LOW (ref 12.0–15.0)
Immature Granulocytes: 9 %
Lymphocytes Relative: 20 %
Lymphs Abs: 1.1 10*3/uL (ref 0.7–4.0)
MCH: 29.8 pg (ref 26.0–34.0)
MCHC: 32.5 g/dL (ref 30.0–36.0)
MCV: 91.7 fL (ref 80.0–100.0)
Monocytes Absolute: 1 10*3/uL (ref 0.1–1.0)
Monocytes Relative: 19 %
Neutro Abs: 2.6 10*3/uL (ref 1.7–7.7)
Neutrophils Relative %: 48 %
Platelet Count: 568 10*3/uL — ABNORMAL HIGH (ref 150–400)
RBC: 3.49 MIL/uL — ABNORMAL LOW (ref 3.87–5.11)
RDW: 18.9 % — ABNORMAL HIGH (ref 11.5–15.5)
WBC Count: 5.4 10*3/uL (ref 4.0–10.5)
nRBC: 3.4 % — ABNORMAL HIGH (ref 0.0–0.2)

## 2020-03-11 MED ORDER — HYDROMORPHONE HCL 4 MG PO TABS: 4.0000 mg | ORAL_TABLET | ORAL | 0 refills | Status: DC | PRN

## 2020-03-11 MED ORDER — DOXYCYCLINE HYCLATE 100 MG PO TABS: 100.0000 mg | ORAL_TABLET | Freq: Two times a day (BID) | ORAL | 0 refills | Status: DC

## 2020-03-11 MED ORDER — SODIUM CHLORIDE 0.9% FLUSH
10.0000 mL | Freq: Once | INTRAVENOUS | Status: AC
  Administered 2020-03-11: 10 mL
  Filled 2020-03-11: qty 10

## 2020-03-11 NOTE — Assessment & Plan Note (Signed)
She has signs and symptoms of recurrent sinusitis again I recommend another course of doxycycline I have checked her immunoglobulin levels recently Her IgG level came back adequate She is not a candidate for immunoglobulin infusion

## 2020-03-11 NOTE — Assessment & Plan Note (Signed)
Her cancer pain is well controlled She will continue current dose of prescribed pain medicine

## 2020-03-11 NOTE — Assessment & Plan Note (Signed)
Unfortunately, despite taking a week break, she has elevated liver enzymes again I plan to hold treatment today and schedule her chemo next week I plan to reduce the dose of both gemcitabine and Abraxane for future treatment, reduce both by 20% each After her treatment next week, I plan to order repeat CT imaging for objective assessment of response to therapy

## 2020-03-11 NOTE — Assessment & Plan Note (Signed)
She denies worsening peripheral neuropathy recently She will continue current dose of gabapentin 

## 2020-03-11 NOTE — Progress Notes (Signed)
Treatment Held today per Dr. Alvy Bimler for elevated LFT. Patient was informed and left with no issues

## 2020-03-11 NOTE — Assessment & Plan Note (Signed)
This is likely due to recent treatment. The patient denies recent history of bleeding such as epistaxis, hematuria or hematochezia. She is asymptomatic from the anemia. I will observe for now.  She does not require transfusion now. 

## 2020-03-11 NOTE — Progress Notes (Signed)
[place] OFFICE PROGRESS NOTE  Patient Care Team: [name], MD as PCP - General (Family Medicine)  ASSESSMENT & PLAN:  Malignant neoplasm of tail of pancreas [facility]) Unfortunately, despite taking a week break, she has elevated liver enzymes again I plan to hold treatment today and schedule her chemo next week I plan to reduce the dose of both gemcitabine and Abraxane for future treatment, reduce both by 20% each After her treatment next week, I plan to order repeat CT imaging for objective assessment of response to therapy  Anemia due to antineoplastic chemotherapy This is likely due to recent treatment. The patient denies recent history of bleeding such as epistaxis, hematuria or hematochezia. She is asymptomatic from the anemia. I will observe for now.  She does not require transfusion now.   Elevated liver enzymes She has recurrent elevated liver enzymes likely due to chemotherapy She is not symptomatic Observe for now We will hold treatment today and reduced dose of chemotherapy for future cycles as above  Cancer associated pain Her cancer pain is well controlled She will continue current dose of prescribed pain medicine  Peripheral neuropathy due to chemotherapy [facility]) She denies worsening peripheral neuropathy recently She will continue current dose of gabapentin  Sinusitis, chronic She has signs and symptoms of recurrent sinusitis again I recommend another course of doxycycline I have checked her immunoglobulin levels recently Her IgG level came back adequate She is not a candidate for immunoglobulin infusion   Orders Placed This Encounter  Procedures  . CT ABDOMEN PELVIS W CONTRAST    Standing Status:   Future    Standing Expiration Date:   03/11/2021    Order Specific Question:   If indicated for the ordered procedure, I authorize the administration of contrast media per Radiology protocol    Answer:   Yes    Order Specific Question:   Preferred  imaging location?    Answer:   [facility]    Order Specific Question:   Radiology Contrast Protocol - do NOT remove file path    Answer:   [email or web address]    Order Specific Question:   Is patient pregnant?    Answer:   No    All questions were answered. The patient knows to call the clinic with any problems, questions or concerns. The total time spent in the appointment was 40 minutes encounter with patients including review of chart and various tests results, discussions about plan of care and coordination of care plan   Heath Lark, MD 03/11/2020 12:48 PM  INTERVAL HISTORY: Please see below for problem oriented charting. 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 Mixed attenuation mass emanates from the right adnexa measuring 10.8 x 8.0 cm very suspicious for right ovarian carcinoma. 2. Abnormality of the tail of the pancreas may be due to mild pancreatitis and small pseudocyst formation, but neoplasm cannot be excluded. 3. Small amount of ascites within abdomen and pelvis. 4. Small nonobstructing renal calculi bilaterally.  08/30/2017 Pathology Results   1. Uterus and cervix, with left fallopian tube - ENDOMETRIOID ADENOCARCINOMA, FIGO GRADE I, ARISING IN A BACKGROUND OF DIFFUSE COMPLEX ATYPICAL HYPERPLASIA. - CARCINOMA INVADES FOR OF DEPTH OF 0.2 CM WHERE THICKNESS OF  MYOMETRIAL WALL IS 2.1 CM. - ALL RESECTION MARGINS ARE NEGATIVE FOR CARCINOMA. - NEGATIVE FOR LYMPHOVASCULAR OR PERINEURAL INVASION. - CERVICAL STROMA IS NOT INVOLVED. - SEE ONCOLOGY TABLE. - SEE NOTE 2. Ovary and fallopian tube, right - PRIMARY OVARIAN ENDOMETRIOID ADENOCARCINOMA, FIGO GRADE II, 12 CM. - THE OVARIAN SURFACE IS FOCALLY INVOLVED BY CARCINOMA. - NEGATIVE FOR LYMPHOVASCULAR INVASION. - BENIGN UNREMARKABLE FALLOPIAN TUBE, NEGATIVE FOR CARCINOMA. - SEE ONCOLOGY TABLE. - SEE NOTE 3. Cul-de-sac biopsy - METASTATIC ADENOCARCINOMA, MOST CONSISTENT WITH PRIMARY OVARIAN ENDOMETRIOID ADENOCARCINOMA. 4. Ovary, left - BENIGN UNREMARKABLE OVARY, NEGATIVE FOR MALIGNANCY. Microscopic Comment 1. UTERUS, CARCINOMA OR CARCINOSARCOMA Procedure: Total hysterectomy with bilateral salpingo-oophorectomy. Histologic type: Endometrioid adenocarcinoma. Histologic Grade: FIGO Grade I Myometrial invasion: Depth of invasion: 2 mm Myometrial thickness: 21 mm Uterine Serosa Involvement: Not identified Cervical stromal involvement: Not identified Extent of involvement of other organs: Not applicable Lymphovascular invasion: Not identified Regional Lymph Nodes: Examined: 0 Sentinel 0 Non-sentinel 0 Total Tumor block for ancillary studies: 1H MMR / MSI testing: Pending Pathologic Stage Classification (pTNM, AJCC 8th edition): pT1a, pNX (v4.1.0.0) 2. OVARY or FALLOPIAN TUBE or PRIMARY PERITONEUM: Procedure: Salpingo-oophorectomy Specimen Integrity: Intact Tumor Site: Right ovary Ovarian Surface Involvement (required only if applicable): Focally involved by carcinoma Fallopian Tube Surface Involvement (required only if applicable): Not identified Tumor Size: 12 cm Histologic Type: Endometrioid adenocarcinoma Histologic Grade: Grade II Implants (required for advanced stage serous/seromucinous borderline tumors only): Not applicable Other Tissue/ Organ Involvement: Cul de sac biopsy involved  by tumor Largest Extrapelvic Peritoneal Focus (required only if applicable): Not applicable Peritoneal/Ascitic Fluid: Negative for carcinoma (case # [id number]) Treatment Effect (required only for high-grade serous carcinomas): Not applicable Regional Lymph Nodes: No lymph nodes submitted or found Number of Lymph Nodes Examined: 0 Pathologic Stage Classification (pTNM, AJCC 8th Edition): pT2b, pN0 Representative Tumor Block: 2B and 2E 1. Molecular study for microsatellite instability and immunohistochemical stains for MMR-related proteins are pending and will be reported in an addendum. 2. Immunohistochemical stain show that the ovarian tumor is positive for CK7 and PAX8 (both diffuse), CDX2 (patchy and weak); and negative for CK20. This immunoprofile is consistent with the above diagnosis. Dr. Lyndon Code has reviewed this case and concurs with the above diagnosis. Molecular study for microsatellite instability and immunohistochemical stains for MMR-related proteins are pending and will be reported in an addendum   08/30/2017 Genetic Testing   Patient has genetic testing done for MSI  Results revealed patient has the following mutation(s): loss of [facility] 2   08/30/2017 Surgery   Surgeon: Mart Piggs, MD Pre-operative Diagnosis:  1. Adnexal mass 2. Abnormal uterine bleeding 3. H/o Cecal CA  Post-operative Diagnosis:  1. Adhesive disease post colon resection 2. Endometrial cancer NOS 3. Adenocarcinoma unknown origin, right ovary, suspicious for GI primary  Operation:  1. Lysis of adhesions ~30 minutes 2. Robotic-assisted laparoscopic total hysterectomy with right salpingo-oophorectomy and left salpingectomy 3. Left oophorectomy (RA-laparoscopic) 4. Pelvic washings  Findings: Adhesions of omentum to anterior abdominal wall. Enlarged cystic right ovary ~10cm. Uterus had small nodules on serosa near where right adnexa was intimate with the surface. No obvious intraoperative rupture of  cyst, although in 2 areas the wall was thin and one of these areas had some bleeding. Slight  scarring of left bladder dome to LUS/cervix. Uterus on frozen section c/w hyperplasia and a small focus of endometrial CA - no myo invasion, <2cm in size. Frozen section on the right adnexa was carcinoma, met from colon or possibly Gyn, favor GI primary, defer to permanent. Left ovary was WNL.     09/15/2017 Cancer Staging   Staging form: Corpus Uteri - Carcinoma and Carcinosarcoma, AJCC 8th Edition - Pathologic: FIGO Stage IA (pT1a, pN0, cM0) - Signed by Heath Lark, MD on 09/15/2017   09/26/2017 Procedure   Successful placement of a right internal jugular approach power injectable Port-A-Cath. The catheter is ready for immediate use.   11/07/2017 Imaging   1. Since 08/18/2017, similar to slight decrease in size of a pancreatic body/tail junction lesion. Cross modality comparison relative to 09/16/2017 MRI is also grossly similar. 2. No evidence of metastatic disease. 3. Aortic Atherosclerosis (ICD10-I70.0).  4. Left nephrolithiasis.   11/18/2017 Genetic Testing   MSH2 c.2237dupT pathogenic mutation identified in the CancerNext panel.  The CancerNext gene panel offered by [employer] includes sequencing and rearrangement analysis for the following 34 genes:   APC, ATM, BARD1, BMPR1A, BRCA1, BRCA2, BRIP1, CDH1, CDK4, CDKN2A, CHEK2, DICER1, HOXB13, EPCAM, GREM1, MLH1, MRE11A, MSH2, MSH6, MUTYH, NBN, NF1, PALB2, PMS2, POLD1, POLE, PTEN, RAD50, RAD51C, RAD51D, SMAD4, SMARCA4, STK11, and TP53.  The report date is November 18, 2017.  MSH2 c.[id number] pathogenic mutation identified on somatic testing.  These results are consistent with a diagnosis of Lynch syndrome.   06/18/2019 - 08/14/2019 Chemotherapy   The patient had FOLFIRINOX for chemotherapy treatment.     01/19/2020 -  Chemotherapy    Patient is on Treatment Plan: PANCREATIC ABRAXANE / GEMCITABINE D1,8,15 Q28D      Secondary  malignant neoplasm of right ovary ([place])  08/23/2017 Tumor Marker   Patient's tumor was tested for the following markers: CA-125 Results of the tumor marker test revealed 139.8   08/31/2017 Initial Diagnosis   Secondary malignant neoplasm of right ovary ([place])   09/15/2017 Cancer Staging   Staging form: Ovary, Fallopian Tube, and Primary Peritoneal Carcinoma, AJCC 8th Edition - Pathologic: Stage IIB (pT2b, pN0, cM0) - Signed by Heath Lark, MD on 09/15/2017   09/27/2017 Imaging   No evidence of metastatic disease or other acute findings within the thorax.  4 cm low-attenuation mass in pancreatic tail, highly suspicious for pancreatic carcinoma. This is caused splenic vein thrombosis, with new venous collaterals in the left upper quadrant. Consider endoscopic ultrasound with FNA for tissue diagnosis.  Stable benign hepatic hemangioma.    09/27/2017 Tumor Marker   Patient's tumor was tested for the following markers: CA-125 Results of the tumor marker test revealed 39.4   11/02/2017 Tumor Marker   Patient's tumor was tested for the following markers: CA-125 Results of the tumor marker test revealed 19   11/07/2017 Tumor Marker   Patient's tumor was tested for the following markers: CA-125 Results of the tumor marker test revealed 18.3   11/18/2017 Genetic Testing   MSH2 c.2237dupT pathogenic mutation identified in the CancerNext panel.  The CancerNext gene panel offered by [employer] includes sequencing and rearrangement analysis for the following 34 genes:   APC, ATM, BARD1, BMPR1A, BRCA1, BRCA2, BRIP1, CDH1, CDK4, CDKN2A, CHEK2, DICER1, HOXB13, EPCAM, GREM1, MLH1, MRE11A, MSH2, MSH6, MUTYH, NBN, NF1, PALB2, PMS2, POLD1, POLE, PTEN, RAD50, RAD51C, RAD51D, SMAD4, SMARCA4, STK11, and TP53.  The report date is November 18, 2017.  MSH2 [id number] pathogenic mutation identified on somatic testing.  These results are consistent with a diagnosis of Lynch syndrome.   12/15/2017  Tumor Marker   Patient's tumor was tested for the following markers: CA-125 Results of the tumor marker test revealed 57.3   01/16/2018 Tumor Marker   Patient's tumor was tested for the following markers: CA-125 Results of the tumor marker test revealed 24.2   04/10/2018 Tumor Marker   Patient's tumor was tested for the following markers: CA-125 Results of the tumor marker test revealed 18.2   07/12/2018 Tumor Marker   Patient's tumor was tested for the following markers: CA-125 Results of the tumor marker test revealed 11.8   10/16/2018 Tumor Marker   Patient's tumor was tested for the following markers: CA125 Results of the tumor marker test revealed 12.4.   01/18/2020 Tumor Marker   Patient's tumor was tested for the following markers: CA-125 Results of the tumor marker test revealed 18   Malignant neoplasm of tail of pancreas ([place])  10/13/2017 Pathology Results   Pancreas tail mass, endoscopic ultrasound-guided, fine needle aspiration II (smears and cell block): Adenocarcinoma   11/18/2017 Genetic Testing   MSH2 c.2237dupT pathogenic mutation identified in the CancerNext panel.  The CancerNext gene panel offered by [employer] includes sequencing and rearrangement analysis for the following 34 genes:   APC, ATM, BARD1, BMPR1A, BRCA1, BRCA2, BRIP1, CDH1, CDK4, CDKN2A, CHEK2, DICER1, HOXB13, EPCAM, GREM1, MLH1, MRE11A, MSH2, MSH6, MUTYH, NBN, NF1, PALB2, PMS2, POLD1, POLE, PTEN, RAD50, RAD51C, RAD51D, SMAD4, SMARCA4, STK11, and TP53.  The report date is November 18, 2017.  MSH2 c.[id number] pathogenic mutation identified on somatic testing.  These results are consistent with a diagnosis of Lynch syndrome.   11/24/2017 Pathology Results   A. "TAIL OF PANCREAS AND SPLEEN", DISTAL PANCREATECTOMY AND SPLENECTOMY: Invasive ductal adenocarcinoma, moderately to poorly differentiated with focal signet ring cell features, of pancreas (distal).   The carcinoma  is 2.5 cm in greatest dimension grossly.   Treatment effects present in the form of fibrosis (50%). No lymphovascular or definite perineural invasion identified. All surgical margins are negative for tumor or high-grade dysplasia. Adjacent mucinous neoplasm, most consistent with Intraductal papillary mucinous neoplasm (IPMN) with low-grade dysplasia.   Uninvolved pancreas show atrophy and focal acute inflammation.   Twelve benign lymph nodes (0/12). Spleen with no significant histopathologic abnormalities.  PROCEDURE: distal pancreatectomy and splenectomy TUMOR SITE: distal pancreas TUMOR SIZE:  GREATEST DIMENSION: 2.5 cm  ADDITIONAL DIMENSIONS:  x  cm HISTOLOGIC TYPE: ductal adenocarcinoma HISTOLOGIC GRADE: grade 3 TUMOR EXTENSION: peripancreatic soft tissue MARGINS: negative for tumor TREATMENT EFFECT: treatment effects present in the form of fibrosis (50%). LYMPHOVASCULAR INVASION: not identified PERINEURAL INVASION: no definite evidence REGIONAL LYMPH NODES:   NUMBER OF LYMPH NODES INVOLVED: 0   NUMBER OF LYMPH NODES EXAMINED: 12 PATHOLOGIC STAGE CLASSIFICATION (pTNM, AJCC 8th Ed): ypT2, ypN0 DISTANT METASTASIS (pM): pMx ADDITIONAL PATHOLOGIC FINDINGS: mucinous neoplasm, most consistent with intraductal papillary mucinous neoplasm (IPMN) with low-grade dysplasia, is identified adjacent to the main tumor.   11/24/2017 Cancer Staging   Staging form: Exocrine Pancreas, AJCC 8th Edition - Pathologic stage from 11/24/2017: Stage IB (pT2, pN0, cM0) - Signed by Truitt Merle, MD on 01/03/2018   11/25/2017 Surgery   She had surgery at [facility] 1. Exploratory Laparotomy 2. Distal Pancreatectomy and Splenectomy 3. Intraoperative Ultrasound 4. Open Cholecystectomy    12/15/2017 Cancer Staging   Staging form: Exocrine Pancreas, AJCC 8th Edition - Clinical: Stage IB (cT2, cN0, cM0) - Signed by Heath Lark, MD on 12/15/2017  12/28/2017  Imaging   12/28/2017 CT Abdomen  IMPRESSION: 1. Postoperative findings from recent partial pancreatectomy including a 21 cubic cm fluid collection along the pancreatic resection margin which could represent early pseudocyst. 2. Nodularity along the lateral limb of the left adrenal gland could also be postoperative but merit surveillance, as the pancreatic lesion was in close proximity to this adrenal gland on the prior CT of 11/07/2017. 3. Asymmetric fullness inferiorly in the left breast. The patient has a history of prior left breast procedures, correlation with mammography is recommended. 4. Other imaging findings of potential clinical significance: Stable hemangioma in the left hepatic lobe. Splenectomy. Aortic Atherosclerosis (ICD10-I70.0). Right hemicolectomy. Small focus of fat necrosis in the right anterior abdominal wall subcutaneous tissues near the laparotomy site. Bilateral nonobstructive nephrolithiasis.   01/02/2018 Tumor Marker   Patient's tumor was tested for the following markers: CA-19-9 Results of the tumor marker test revealed 5   01/03/2018 - 06/05/2018 Chemotherapy   She received modified dose FOLFIRINOX   04/10/2018 Imaging   1. Distal pancreatectomy, without findings of recurrent or metastatic disease. 2. Incompletely imaged hypoenhancement within lower lobe right pulmonary artery branch is likely chronic (but interval since 10/09/2017) pulmonary embolism. Dedicated CTA could further evaluate. 3. Decrease in size of a peripancreatic complex fluid collection anteriorly, likely a resolving pseudocyst. 4. Decreased size of minimal fluid versus a borderline sized node in the gastrohepatic ligament. Recommend attention on follow-up. 5. Hepatic steatosis with a segment 4 hemangioma. 6. Aortic Atherosclerosis (ICD10-I70.0). This is significantly age advanced.   07/12/2018 Tumor Marker   Patient's tumor was tested for the following markers: CA-19-9 Results of the tumor marker  test revealed 6   07/12/2018 Imaging   1. Status post distal pancreatectomy with stable postoperative fluid collection adjacent to the ventral aspect of the pancreatic head. No findings to suggest metastatic disease in the abdomen or pelvis. 2. Hepatic steatosis with small cavernous hemangioma in segment 4A of the liver. 3. Slight decreased size of nonenlarged gastrohepatic ligament lymph node, presumably benign. 4. Aortic atherosclerosis.    10/16/2018 Imaging   Status post distal pancreatectomy. Stable postoperative seroma along the surgical margin.   Status post hysterectomy and right oophorectomy.   Status post right hemicolectomy with appendectomy.   No evidence of recurrent or metastatic disease.   No colonic wall thickening or mass is evident on CT.   01/22/2019 Tumor Marker   Patient's tumor was tested for the following markers: CA-19.9 Results of the tumor marker test revealed 5   01/22/2019 Imaging   1. No evidence of local recurrence or metastatic disease status post distal pancreatectomy and splenectomy. 2. Stable small seroma anterior to the pancreatic head. 3. Postsurgical changes as described. 4. Stable additional incidental findings including a hepatic hemangioma, nonobstructing bilateral renal calculi and aortic Atherosclerosis (ICD10-I70.0).   04/18/2019 Imaging   1. Status post distal pancreatectomy and splenectomy. There has been a gradual increase in ill-defined soft tissue and celiac axis/SMA origin lymph nodes adjacent to the distal pancreatectomy margin and lesser curvature of the stomach/gastric body over sequential prior examinations dated 01/22/2019 and 09/26/2018. An enlarged lymph node or soft tissue nodule adjacent to the SMA origin now measures 1.8 x 0.8 cm. Findings are concerning for local recurrence of pancreatic malignancy. 2. Separately from the above findings, there remains a 1.0 cm low-attenuation nodule anterior to the remnant pancreatic neck,  most consistent with postoperative seroma 3. No evidence of distant metastatic disease in the chest, abdomen, or pelvis. 4.  Postoperative findings of cholecystectomy, right hemicolectomy, and hysterectomy. 5. Bilateral nonobstructive nephrolithiasis. 6.  Aortic Atherosclerosis (ICD10-I70.0).       05/28/2019 Tumor Marker   Patient's tumor was tested for the following markers: CA19-9 Results of the tumor marker test revealed 18   05/28/2019 Imaging   1. Status post distal pancreatectomy with continued further progression of the ill-defined soft tissue to the left of the celiac axis/SMA origin, now measuring 1.9 x 1.9 cm and highly concerning for recurrent/metastatic disease. This process abuts both the celiac axis, SMA, and posterior wall of the mid stomach. PET-CT may prove helpful to further evaluate. 2. Bandlike ill-defined soft tissue in the anterior abdomen, potentially peritoneal or omental is similar to perhaps minimally increased qualitatively in the interval. Close attention on follow-up recommended. 3. Left paraumbilical hernia contains a short segment of small bowel without complicating features. 4. Ill-defined very subtle area of decreased attenuation in the posterior aspect of hepatic segment IV. This is likely focal fatty deposition. Close attention on follow-up recommended. 5.  Aortic Atherosclerois (ICD10-170.0)   06/07/2019 Pathology Results   Malignant cells consistent with adenocarcinoma   06/07/2019 Procedure   ENDOSCOPIC FINDING (limited views with linear echoendoscope): :      The examined esophagus was endoscopically normal.      The entire examined stomach was endoscopically normal.      ENDOSONOGRAPHIC FINDING: :      1. An irregular mass was identified in the region of the pancreatic tail resection site. The mass was stellate with very poorly defined borders. Fine needle aspiration for cytology was performed. Color Doppler imaging was utilized prior to needle puncture to  confirm a lack of significant vascular structures within the needle path. Four passes were made with the 25 gauge (FNB) needle using a transgastric approach. A cytotechnologist was present to evaluate the adequacy of the specimen. Final cytology results are pending. Impression:               -  Irregular, stellate soft tissue mass in the region of the pancreatic tail resection site. This was sampled with four transgastric passes with an EUS FNB needle..   06/18/2019 - 08/14/2019 Chemotherapy   The patient had FOLFIRINOX for chemotherapy treatment.     09/03/2019 Imaging   1. Findings are again highly concerning for locally recurrent disease adjacent to the pancreatectomy bed, with enlarging soft tissue mass which is intimately associated with the superior mesenteric artery, celiac axis and superior aspect of the left renal  vein, as detailed above. 2. No evidence of metastatic disease in the thorax. 3. 2.7 x 1.2 cm cavernous hemangioma in segment 4A of the liver again noted. 4. Small nonobstructive calculi in the collecting systems of both kidneys measuring up to 3 mm in the lower pole collecting system of the left kidney. 5. Aortic atherosclerosis. 6. Additional incidental findings, as above.   11/16/2019 Imaging   Similar-appearing ill-defined soft tissue the left of the celiac axis/SMA, most compatible with recurrent/metastatic disease.   Similar ill-defined nodularity within the anterior upper abdomen, potentially peritoneal or omental. Continued attention on follow-up is recommended.   12/31/2019 Tumor Marker   Patient's tumor was tested for the following markers: CA-19-9 Results of the tumor marker test revealed 298   01/10/2020 Imaging   1. Numerous small hypoenhancing liver masses scattered throughout the liver, all new since 11/16/2019 CT, compatible with new liver metastases. 2. Stable upper retroperitoneal mass encasing the celiac axis and abutting the pancreatic resection margin,  compatible with stable locally recurrent tumor. 3. Stable subcentimeter indistinct anterior upper peritoneal implants. 4. No evidence of metastatic disease in the chest. 5. Chronic findings include: Nonobstructing left nephrolithiasis. Stable small left paraumbilical hernia containing a small bowel loop without acute bowel complication. Aortic Atherosclerosis (ICD10-I70.0).     01/19/2020 -  Chemotherapy    Patient is on Treatment Plan: PANCREATIC ABRAXANE / GEMCITABINE D1,8,15 Q28D      01/25/2020 Tumor Marker   Patient's tumor was tested for the following markers: CA-19-9 Results of the tumor marker test revealed 1104.   03/04/2020 Tumor Marker   Patient's tumor was tested for the following markers: CA-19-9 Results of the tumor marker test revealed 581     REVIEW OF SYSTEMS:   CoEyes: Denies blurriness of vision Respiratory: Denies cough, dyspnea or wheezes Cardiovascular: Denies palpitation, chest discomfort or lower extremity swelling Skin: Denies abnormal skin rashes Lymphatics: Denies new lymphadenopathy or easy bruising Behavioral/Psych: Mood is stable, no new changes  All other systems were reviewed with the patient and are negative.  I have reviewed the past medical history, past surgical history, social history and family history with the patient and they are unchanged from previous note.  ALLERGIES:  is allergic to codeine, penicillins, and bactrim [sulfamethoxazole-trimethoprim].  MEDICATIONS:  Current Outpatient Medications  Medication Sig Dispense Refill  . cetirizine (ZYRTEC) 10 MG tablet Take 10 mg by mouth daily.    . citalopram (CELEXA) 20 MG tablet TAKE 1 TABLET BY MOUTH EVERY DAY 90 tablet 4  . CREON 24000-76000 units CPEP Take 1 capsule by mouth See admin instructions. Take 1 capsule before each meal and 1 capsule after each meal, take 1 cap with snacks    . diclofenac Sodium (VOLTAREN) 1 % GEL APPLY 2-4 GRAMS TO AFFECTED JOINT 4 TIMES DAILY AS NEEDED. 400 g  1  . doxycycline (VIBRA-TABS) 100 MG tablet Take 1 tablet (100 mg total) by mouth 2 (two) times daily. 28 tablet 0  . estradiol (ESTRACE VAGINAL) 0.1 MG/GM vaginal cream Place 1 Applicatorful vaginally 3 (three) times a week. 42.5 g 12  . fluticasone (FLONASE) 50 MCG/ACT nasal spray Place 1 spray into both nostrils daily.    Marland Kitchen gabapentin (NEURONTIN) 300 MG capsule Take 1 capsule (300 mg total) by mouth 3 (three) times daily. 90 capsule 11  . HYDROmorphone (DILAUDID) 4 MG tablet Take 1 tablet (4 mg total) by mouth every 4 (four) hours as needed. 60 tablet 0  . hydrOXYzine (ATARAX/VISTARIL) 10 MG tablet Take 1 tablet (10 mg total) by mouth 3 (three) times daily as needed. 30 tablet 1  . lidocaine-prilocaine (EMLA) cream Apply 1 application topically daily as needed (port). Apply to affected area once 30 g 11  . LORazepam (ATIVAN) 0.5 MG tablet Take 1 tablet (0.5 mg total) by mouth every 8 (eight) hours as needed for anxiety (nausea). 30 tablet 0  . metFORMIN (GLUCOPHAGE) 500 MG tablet TAKE 1 TABLET BY MOUTH TWICE A DAY WITH A MEAL 180 tablet 11  . methocarbamol (ROBAXIN) 500 MG tablet TAKE 1 TABLET BY MOUTH THREE TIMES A DAY AS NEEDED FOR MUSCLE SPASMS 90 tablet 0  . Multiple Vitamin (MULTIVITAMIN WITH MINERALS) TABS tablet Take 1 tablet by mouth daily.    Marland Kitchen omeprazole (PRILOSEC) 40 MG capsule TAKE 1 CAPSULE BY MOUTH EVERY DAY 90 capsule 3  . ondansetron (ZOFRAN) 8 MG tablet TAKE 1 TAB BY MOUTH EVERY 8HOURS AS NEEDED FOR REFRACTORY NAUSEA/VOMITING START ON DAY 3AFTER CHEMO 8 tablet 7  .  predniSONE (DELTASONE) 5 MG tablet TAKE 2 TABLETS BY MOUTH DAILY WITH BREAKFAST. 60 tablet 2  . prochlorperazine (COMPAZINE) 10 MG tablet TAKE 1 TABLET BY MOUTH EVERY 6 HOURS AS NEEDED 90 tablet 3  . rivaroxaban (XARELTO) 20 MG TABS tablet Take 1 tablet (20 mg total) by mouth daily with supper. 30 tablet 9  . simethicone (MYLICON) 675 MG chewable tablet Chew 125 mg by mouth every 6 (six) hours as needed for flatulence.      No current facility-administered medications for this visit.    PHYSICAL EXAMINATION: ECOG PERFORMANCE STATUS: 1 - Symptomatic but completely ambulatory  Vitals:   03/11/20 1042  BP: (!) 136/91  Pulse: 97  Resp: 18  Temp: 99.7 F (37.6 C)  SpO2: 100%   Filed Weights   03/11/20 1042  Weight: 217 lb 9.6 oz (98.7 kg)    GENERAL:alert, no distress and comfortable SKIN: skin color, texture, turgor are normal, no rashes or significant lesions EYES: normal, Conjunctiva are pink and non-injected, sclera clear OROPHARYNX:no exudate, no erythema and lips, buccal mucosa, and tongue normal  NECK: supple, thyroid normal size, non-tender, without nodularity LYMPH:  no palpable lymphadenopathy in the cervical, axillary or inguinal LUNGS: clear to auscultation and percussion with normal breathing effort HEART: regular rate & rhythm and no murmurs and no lower extremity edema ABDOMEN:abdomen soft, non-tender and normal bowel sounds Musculoskeletal:no cyanosis of digits and no clubbing  NEURO: alert & oriented x 3 with fluent speech, no focal motor/sensory deficits  LABORATORY DATA:  I have reviewed the data as listed    Component Value Date/Time   NA 137 03/11/2020 1032   K 4.3 03/11/2020 1032   CL 100 03/11/2020 1032   CO2 27 03/11/2020 1032   GLUCOSE 142 (H) 03/11/2020 1032   BUN 13 03/11/2020 1032   CREATININE 0.66 03/11/2020 1032   CREATININE 0.68 07/27/2017 0851   CALCIUM 8.9 03/11/2020 1032   PROT 7.0 03/11/2020 1032   ALBUMIN 3.6 03/11/2020 1032   AST 111 (H) 03/11/2020 1032   ALT 156 (H) 03/11/2020 1032   ALKPHOS 78 03/11/2020 1032   BILITOT 0.2 (L) 03/11/2020 1032   GFRNONAA >60 03/11/2020 1032   GFRNONAA 106 07/27/2017 0851   GFRAA >60 11/09/2019 1012   GFRAA >60 09/27/2019 1535   GFRAA 123 07/27/2017 0851    No results found for: SPEP, UPEP  Lab Results  Component Value Date   WBC 5.4 03/11/2020   NEUTROABS 2.6 03/11/2020   HGB 10.4 (L) 03/11/2020    HCT 32.0 (L) 03/11/2020   MCV 91.7 03/11/2020   PLT 568 (H) 03/11/2020      Chemistry      Component Value Date/Time   NA 137 03/11/2020 1032   K 4.3 03/11/2020 1032   CL 100 03/11/2020 1032   CO2 27 03/11/2020 1032   BUN 13 03/11/2020 1032   CREATININE 0.66 03/11/2020 1032   CREATININE 0.68 07/27/2017 0851      Component Value Date/Time   CALCIUM 8.9 03/11/2020 1032   ALKPHOS 78 03/11/2020 1032   AST 111 (H) 03/11/2020 1032   ALT 156 (H) 03/11/2020 1032   BILITOT 0.2 (L) 03/11/2020 1032

## 2020-03-11 NOTE — Patient Instructions (Signed)

## 2020-03-11 NOTE — Assessment & Plan Note (Signed)
She has recurrent elevated liver enzymes likely due to chemotherapy She is not symptomatic Observe for now We will hold treatment today and reduced dose of chemotherapy for future cycles as above

## 2020-03-14 ENCOUNTER — Telehealth: Payer: Self-pay

## 2020-03-14 NOTE — Telephone Encounter (Signed)
OK, we will see how she is next Tuesday when she comes in for Rx

## 2020-03-14 NOTE — Telephone Encounter (Signed)
Called and given below message. She verbalized understanding. She does not feel better, but the cough is better. Denies fever. Still c/o nasal congestion. She started sudafed yesterday and tylenol prn.

## 2020-03-14 NOTE — Telephone Encounter (Signed)
-----   Message from Heath Lark, MD sent at 03/14/2020  7:45 AM EST ----- Regarding: can you ask how is her sinusitis doing?

## 2020-03-17 ENCOUNTER — Telehealth: Payer: Self-pay | Admitting: Hematology and Oncology

## 2020-03-17 NOTE — Telephone Encounter (Signed)
Called patient and informed her of her upcoming appointment. Patient is aware.

## 2020-03-18 ENCOUNTER — Other Ambulatory Visit: Payer: BC Managed Care – PPO

## 2020-03-18 ENCOUNTER — Inpatient Hospital Stay: Payer: BC Managed Care – PPO

## 2020-03-18 ENCOUNTER — Other Ambulatory Visit: Payer: Self-pay

## 2020-03-18 ENCOUNTER — Ambulatory Visit: Payer: BC Managed Care – PPO

## 2020-03-18 ENCOUNTER — Telehealth: Payer: Self-pay

## 2020-03-18 ENCOUNTER — Encounter: Payer: Self-pay | Admitting: Hematology and Oncology

## 2020-03-18 VITALS — BP 133/75 | HR 71 | Temp 98.1°F | Resp 18 | Wt 214.1 lb

## 2020-03-18 DIAGNOSIS — C541 Malignant neoplasm of endometrium: Secondary | ICD-10-CM

## 2020-03-18 DIAGNOSIS — Z5111 Encounter for antineoplastic chemotherapy: Secondary | ICD-10-CM | POA: Diagnosis not present

## 2020-03-18 DIAGNOSIS — C252 Malignant neoplasm of tail of pancreas: Secondary | ICD-10-CM

## 2020-03-18 DIAGNOSIS — D6481 Anemia due to antineoplastic chemotherapy: Secondary | ICD-10-CM | POA: Diagnosis not present

## 2020-03-18 DIAGNOSIS — Z7189 Other specified counseling: Secondary | ICD-10-CM

## 2020-03-18 DIAGNOSIS — Z79899 Other long term (current) drug therapy: Secondary | ICD-10-CM | POA: Diagnosis not present

## 2020-03-18 LAB — CBC WITH DIFFERENTIAL (CANCER CENTER ONLY)
Abs Immature Granulocytes: 0.2 10*3/uL — ABNORMAL HIGH (ref 0.00–0.07)
Basophils Absolute: 0.1 10*3/uL (ref 0.0–0.1)
Basophils Relative: 1 %
Eosinophils Absolute: 0.2 10*3/uL (ref 0.0–0.5)
Eosinophils Relative: 2 %
HCT: 35 % — ABNORMAL LOW (ref 36.0–46.0)
Hemoglobin: 11 g/dL — ABNORMAL LOW (ref 12.0–15.0)
Immature Granulocytes: 2 %
Lymphocytes Relative: 20 %
Lymphs Abs: 1.9 10*3/uL (ref 0.7–4.0)
MCH: 29.3 pg (ref 26.0–34.0)
MCHC: 31.4 g/dL (ref 30.0–36.0)
MCV: 93.1 fL (ref 80.0–100.0)
Monocytes Absolute: 1 10*3/uL (ref 0.1–1.0)
Monocytes Relative: 10 %
Neutro Abs: 6.1 10*3/uL (ref 1.7–7.7)
Neutrophils Relative %: 65 %
Platelet Count: 472 10*3/uL — ABNORMAL HIGH (ref 150–400)
RBC: 3.76 MIL/uL — ABNORMAL LOW (ref 3.87–5.11)
RDW: 19 % — ABNORMAL HIGH (ref 11.5–15.5)
WBC Count: 9.4 10*3/uL (ref 4.0–10.5)
nRBC: 0.2 % (ref 0.0–0.2)

## 2020-03-18 LAB — CMP (CANCER CENTER ONLY)
ALT: 49 U/L — ABNORMAL HIGH (ref 0–44)
AST: 26 U/L (ref 15–41)
Albumin: 3.3 g/dL — ABNORMAL LOW (ref 3.5–5.0)
Alkaline Phosphatase: 85 U/L (ref 38–126)
Anion gap: 7 (ref 5–15)
BUN: 13 mg/dL (ref 6–20)
CO2: 26 mmol/L (ref 22–32)
Calcium: 8.7 mg/dL — ABNORMAL LOW (ref 8.9–10.3)
Chloride: 107 mmol/L (ref 98–111)
Creatinine: 0.68 mg/dL (ref 0.44–1.00)
GFR, Estimated: >60 mL/min (ref >60)
Glucose, Bld: 144 mg/dL — ABNORMAL HIGH (ref 70–99)
Potassium: 4.2 mmol/L (ref 3.5–5.1)
Sodium: 140 mmol/L (ref 135–145)
Total Bilirubin: <0.2 mg/dL — ABNORMAL LOW (ref 0.3–1.2)
Total Protein: 6.5 g/dL (ref 6.5–8.1)

## 2020-03-18 MED ORDER — SODIUM CHLORIDE 0.9 % IV SOLN
800.0000 mg/m2 | Freq: Once | INTRAVENOUS | Status: AC
  Administered 2020-03-18: 1672 mg via INTRAVENOUS
  Filled 2020-03-18: qty 43.97

## 2020-03-18 MED ORDER — SODIUM CHLORIDE 0.9 % IV SOLN
Freq: Once | INTRAVENOUS | Status: AC
  Filled 2020-03-18: qty 250

## 2020-03-18 MED ORDER — PACLITAXEL PROTEIN-BOUND CHEMO INJECTION 100 MG
100.0000 mg/m2 | Freq: Once | INTRAVENOUS | Status: AC
  Administered 2020-03-18: 200 mg via INTRAVENOUS
  Filled 2020-03-18: qty 40

## 2020-03-18 MED ORDER — ONDANSETRON HCL 4 MG/2ML IJ SOLN
8.0000 mg | Freq: Once | INTRAMUSCULAR | Status: AC
  Administered 2020-03-18: 8 mg via INTRAVENOUS

## 2020-03-18 MED ORDER — ONDANSETRON HCL 4 MG/2ML IJ SOLN
INTRAMUSCULAR | Status: AC
  Filled 2020-03-18: qty 2

## 2020-03-18 NOTE — Telephone Encounter (Signed)
-----   Message from Heath Lark, MD sent at 03/18/2020  3:29 PM EST ----- Regarding: I think she is at chemo? Pls call her Hard to know if her changes is from steroids and abnormal labs Recommend going to eye doctor for exam for now

## 2020-03-18 NOTE — Telephone Encounter (Signed)
Called and given below message. She verbalized understanding.She will call for eye appt.

## 2020-03-18 NOTE — Patient Instructions (Signed)
[facility] Discharge Instructions for Patients Receiving Chemotherapy  Today you received the following chemotherapy agents Paclitaxel, Gemcitabine  To help prevent nausea and vomiting after your treatment, we encourage you to take your nausea medication as directed. If you develop nausea and vomiting that is not controlled by your nausea medication, call the clinic.   BELOW ARE SYMPTOMS THAT SHOULD BE REPORTED IMMEDIATELY:  *FEVER GREATER THAN 100.5 F  *CHILLS WITH OR WITHOUT FEVER  NAUSEA AND VOMITING THAT IS NOT CONTROLLED WITH YOUR NAUSEA MEDICATION  *UNUSUAL SHORTNESS OF BREATH  *UNUSUAL BRUISING OR BLEEDING  TENDERNESS IN MOUTH AND THROAT WITH OR WITHOUT PRESENCE OF ULCERS  *URINARY PROBLEMS  *BOWEL PROBLEMS  UNUSUAL RASH Items with * indicate a potential emergency and should be followed up as soon as possible.  Feel free to call the clinic should you have any questions or concerns. The clinic phone number is [phone].  Please show the CHEMO ALERT CARD at check-in to the Emergency Department and triage nurse.   

## 2020-03-24 ENCOUNTER — Other Ambulatory Visit: Payer: Self-pay | Admitting: Hematology and Oncology

## 2020-03-24 ENCOUNTER — Telehealth: Payer: Self-pay

## 2020-03-24 ENCOUNTER — Other Ambulatory Visit: Payer: Self-pay

## 2020-03-24 ENCOUNTER — Encounter: Payer: Self-pay | Admitting: Hematology and Oncology

## 2020-03-24 DIAGNOSIS — D6481 Anemia due to antineoplastic chemotherapy: Secondary | ICD-10-CM

## 2020-03-24 DIAGNOSIS — R748 Abnormal levels of other serum enzymes: Secondary | ICD-10-CM

## 2020-03-24 DIAGNOSIS — C252 Malignant neoplasm of tail of pancreas: Secondary | ICD-10-CM

## 2020-03-24 DIAGNOSIS — C541 Malignant neoplasm of endometrium: Secondary | ICD-10-CM

## 2020-03-24 DIAGNOSIS — T451X5A Adverse effect of antineoplastic and immunosuppressive drugs, initial encounter: Secondary | ICD-10-CM

## 2020-03-24 NOTE — Telephone Encounter (Signed)
Called regarding mychart message. Tested positive for Covid on 1/28, onset of symptoms on 1/27. Dr. Alvy Bimler sending a scheduling message to reschedule appts. Instructed to move CT TO 2/18. She verbalized understanding. Referral to infusion center for monoclonal infusion. Ask her to call the office back if needed. She verbalized understanding.

## 2020-03-25 ENCOUNTER — Encounter: Payer: Self-pay | Admitting: Hematology and Oncology

## 2020-03-25 ENCOUNTER — Other Ambulatory Visit (HOSPITAL_COMMUNITY): Payer: Self-pay | Admitting: Adult Health

## 2020-03-25 ENCOUNTER — Ambulatory Visit (HOSPITAL_COMMUNITY)
Admission: RE | Admit: 2020-03-25 | Discharge: 2020-03-25 | Disposition: A | Discharge status: Home / Self Care | Payer: BC Managed Care – PPO | Source: Ambulatory Visit | Attending: Pulmonary Disease | Admitting: Pulmonary Disease

## 2020-03-25 ENCOUNTER — Telehealth: Payer: Self-pay | Admitting: Hematology and Oncology

## 2020-03-25 ENCOUNTER — Encounter: Payer: Self-pay | Admitting: Adult Health

## 2020-03-25 DIAGNOSIS — U071 COVID-19: Secondary | ICD-10-CM

## 2020-03-25 MED ORDER — SOTROVIMAB 500 MG/8ML IV SOLN
500.0000 mg | Freq: Once | INTRAVENOUS | Status: AC
  Administered 2020-03-25: 500 mg via INTRAVENOUS

## 2020-03-25 MED ORDER — ALBUTEROL SULFATE HFA 108 (90 BASE) MCG/ACT IN AERS: 2.0000 | INHALATION_SPRAY | Freq: Once | RESPIRATORY_TRACT | Status: DC | PRN

## 2020-03-25 MED ORDER — FAMOTIDINE IN NACL 20-0.9 MG/50ML-% IV SOLN: 20.0000 mg | Freq: Once | INTRAVENOUS | Status: DC | PRN

## 2020-03-25 MED ORDER — EPINEPHRINE 0.3 MG/0.3ML IJ SOAJ: 0.3000 mg | Freq: Once | INTRAMUSCULAR | Status: DC | PRN

## 2020-03-25 MED ORDER — SODIUM CHLORIDE 0.9 % IV SOLN: INTRAVENOUS | Status: DC | PRN

## 2020-03-25 MED ORDER — METHYLPREDNISOLONE SODIUM SUCC 125 MG IJ SOLR: 125.0000 mg | Freq: Once | INTRAMUSCULAR | Status: DC | PRN

## 2020-03-25 MED ORDER — DIPHENHYDRAMINE HCL 50 MG/ML IJ SOLN: 50.0000 mg | Freq: Once | INTRAMUSCULAR | Status: DC | PRN

## 2020-03-25 MED ORDER — HEPARIN SOD (PORK) LOCK FLUSH 100 UNIT/ML IV SOLN: 500.0000 [IU] | Freq: Once | INTRAVENOUS | Status: DC

## 2020-03-25 NOTE — Progress Notes (Signed)
Diagnosis: COVID-19  Physician: Dr. Patrick Wright  Procedure: Covid Infusion Clinic Med: Sotrovimab infusion - Provided patient with sotrovimab fact sheet for patients, parents, and caregivers prior to infusion.   Complications: No immediate complications noted  Discharge: Discharged home    

## 2020-03-25 NOTE — Progress Notes (Signed)
Patient reviewed Fact Sheet for Patients, Parents, and Caregivers for Emergency Use Authorization (EUA) of sotrovimab for the Treatment of Coronavirus. Patient also reviewed and is agreeable to the estimated cost of treatment. Patient is agreeable to proceed.   

## 2020-03-25 NOTE — Discharge Instructions (Signed)

## 2020-03-25 NOTE — Progress Notes (Signed)
I connected by phone with Dana Hicks on 03/25/2020 at 12:19 PM to discuss the potential use of a new treatment for mild to moderate COVID-19 viral infection in non-hospitalized patients.  This patient is a 45 y.o. female that meets the FDA criteria for Emergency Use Authorization of COVID monoclonal antibody sotrovimab.  Has a (+) direct SARS-CoV-2 viral test result  Has mild or moderate COVID-19   Is NOT hospitalized due to COVID-19  Is within 10 days of symptom onset  Has at least one of the high risk factor(s) for progression to severe COVID-19 and/or hospitalization as defined in EUA.  Specific high risk criteria : BMI > 25, Diabetes and Immunosuppressive Disease or Treatment   I have spoken and communicated the following to the patient or parent/caregiver regarding COVID monoclonal antibody treatment:  1. FDA has authorized the emergency use for the treatment of mild to moderate COVID-19 in adults and pediatric patients with positive results of direct SARS-CoV-2 viral testing who are 65 years of age and older weighing at least 40 kg, and who are at high risk for progressing to severe COVID-19 and/or hospitalization.  2. The significant known and potential risks and benefits of COVID monoclonal antibody, and the extent to which such potential risks and benefits are unknown.  3. Information on available alternative treatments and the risks and benefits of those alternatives, including clinical trials.  4. Patients treated with COVID monoclonal antibody should continue to self-isolate and use infection control measures (e.g., wear mask, isolate, social distance, avoid sharing personal items, clean and disinfect "high touch" surfaces, and frequent handwashing) according to CDC guidelines.   5. The patient or parent/caregiver has the option to accept or refuse COVID monoclonal antibody treatment. 6. Sx onset 1/27; cost reviewed  After reviewing this information with the patient, the  patient has agreed to receive one of the available covid 19 monoclonal antibodies and will be provided an appropriate fact sheet prior to infusion. Scot Dock, NP 03/25/2020 12:19 PM

## 2020-03-25 NOTE — Telephone Encounter (Signed)
Called pt per 1/31 sch msg - left message for patient with appt date and time

## 2020-03-31 ENCOUNTER — Ambulatory Visit (HOSPITAL_COMMUNITY): Payer: BC Managed Care – PPO

## 2020-03-31 ENCOUNTER — Encounter: Payer: Self-pay | Admitting: Hematology and Oncology

## 2020-04-01 ENCOUNTER — Other Ambulatory Visit: Payer: BC Managed Care – PPO

## 2020-04-01 ENCOUNTER — Telehealth: Payer: Self-pay

## 2020-04-01 ENCOUNTER — Ambulatory Visit: Payer: BC Managed Care – PPO

## 2020-04-01 ENCOUNTER — Ambulatory Visit: Payer: BC Managed Care – PPO | Admitting: Hematology and Oncology

## 2020-04-01 NOTE — Telephone Encounter (Signed)
Called and moved appts to 2/18, lab, flush and Dr. Alvy Bimler appt. She is aware of appt times. Scheduling message sent to move infusion appt to 2/22.

## 2020-04-04 ENCOUNTER — Telehealth: Payer: Self-pay | Admitting: Hematology and Oncology

## 2020-04-04 NOTE — Telephone Encounter (Signed)
Rescheduled appt per 2/08 sch msg - left message for patient with new appt date and time

## 2020-04-11 ENCOUNTER — Inpatient Hospital Stay (HOSPITAL_BASED_OUTPATIENT_CLINIC_OR_DEPARTMENT_OTHER): Payer: BC Managed Care – PPO | Admitting: Hematology and Oncology

## 2020-04-11 ENCOUNTER — Encounter: Payer: Self-pay | Admitting: Hematology and Oncology

## 2020-04-11 ENCOUNTER — Ambulatory Visit (HOSPITAL_COMMUNITY)
Admission: RE | Admit: 2020-04-11 | Discharge: 2020-04-11 | Disposition: A | Discharge status: Home / Self Care | Payer: BC Managed Care – PPO | Source: Ambulatory Visit | Attending: Hematology and Oncology | Admitting: Hematology and Oncology

## 2020-04-11 ENCOUNTER — Inpatient Hospital Stay: Payer: BC Managed Care – PPO

## 2020-04-11 ENCOUNTER — Inpatient Hospital Stay: Payer: BC Managed Care – PPO | Attending: Hematology and Oncology

## 2020-04-11 ENCOUNTER — Encounter (HOSPITAL_COMMUNITY): Payer: Self-pay

## 2020-04-11 ENCOUNTER — Other Ambulatory Visit: Payer: Self-pay

## 2020-04-11 DIAGNOSIS — G620 Drug-induced polyneuropathy: Secondary | ICD-10-CM

## 2020-04-11 DIAGNOSIS — D6481 Anemia due to antineoplastic chemotherapy: Secondary | ICD-10-CM

## 2020-04-11 DIAGNOSIS — C252 Malignant neoplasm of tail of pancreas: Secondary | ICD-10-CM

## 2020-04-11 DIAGNOSIS — T380X5D Adverse effect of glucocorticoids and synthetic analogues, subsequent encounter: Secondary | ICD-10-CM

## 2020-04-11 DIAGNOSIS — Z5111 Encounter for antineoplastic chemotherapy: Secondary | ICD-10-CM | POA: Insufficient documentation

## 2020-04-11 DIAGNOSIS — C7961 Secondary malignant neoplasm of right ovary: Secondary | ICD-10-CM | POA: Diagnosis not present

## 2020-04-11 DIAGNOSIS — Z8542 Personal history of malignant neoplasm of other parts of uterus: Secondary | ICD-10-CM | POA: Diagnosis not present

## 2020-04-11 DIAGNOSIS — T451X5A Adverse effect of antineoplastic and immunosuppressive drugs, initial encounter: Secondary | ICD-10-CM

## 2020-04-11 DIAGNOSIS — E099 Drug or chemical induced diabetes mellitus without complications: Secondary | ICD-10-CM

## 2020-04-11 DIAGNOSIS — N200 Calculus of kidney: Secondary | ICD-10-CM | POA: Diagnosis not present

## 2020-04-11 DIAGNOSIS — C541 Malignant neoplasm of endometrium: Secondary | ICD-10-CM

## 2020-04-11 DIAGNOSIS — R748 Abnormal levels of other serum enzymes: Secondary | ICD-10-CM

## 2020-04-11 DIAGNOSIS — K429 Umbilical hernia without obstruction or gangrene: Secondary | ICD-10-CM | POA: Diagnosis not present

## 2020-04-11 DIAGNOSIS — Z452 Encounter for adjustment and management of vascular access device: Secondary | ICD-10-CM | POA: Diagnosis not present

## 2020-04-11 DIAGNOSIS — Z7189 Other specified counseling: Secondary | ICD-10-CM

## 2020-04-11 LAB — CMP (CANCER CENTER ONLY)
ALT: 50 U/L — ABNORMAL HIGH (ref 0–44)
AST: 27 U/L (ref 15–41)
Albumin: 3.3 g/dL — ABNORMAL LOW (ref 3.5–5.0)
Alkaline Phosphatase: 89 U/L (ref 38–126)
Anion gap: 9 (ref 5–15)
BUN: 12 mg/dL (ref 6–20)
CO2: 26 mmol/L (ref 22–32)
Calcium: 8.6 mg/dL — ABNORMAL LOW (ref 8.9–10.3)
Chloride: 104 mmol/L (ref 98–111)
Creatinine: 0.67 mg/dL (ref 0.44–1.00)
GFR, Estimated: >60 mL/min (ref >60)
Glucose, Bld: 102 mg/dL — ABNORMAL HIGH (ref 70–99)
Potassium: 4.3 mmol/L (ref 3.5–5.1)
Sodium: 139 mmol/L (ref 135–145)
Total Bilirubin: 0.3 mg/dL (ref 0.3–1.2)
Total Protein: 6.5 g/dL (ref 6.5–8.1)

## 2020-04-11 LAB — CBC WITH DIFFERENTIAL (CANCER CENTER ONLY)
Abs Immature Granulocytes: 0.08 10*3/uL — ABNORMAL HIGH (ref 0.00–0.07)
Basophils Absolute: 0.1 10*3/uL (ref 0.0–0.1)
Basophils Relative: 1 %
Eosinophils Absolute: 0.3 10*3/uL (ref 0.0–0.5)
Eosinophils Relative: 3 %
HCT: 34.4 % — ABNORMAL LOW (ref 36.0–46.0)
Hemoglobin: 10.9 g/dL — ABNORMAL LOW (ref 12.0–15.0)
Immature Granulocytes: 1 %
Lymphocytes Relative: 29 %
Lymphs Abs: 2.9 10*3/uL (ref 0.7–4.0)
MCH: 29.6 pg (ref 26.0–34.0)
MCHC: 31.7 g/dL (ref 30.0–36.0)
MCV: 93.5 fL (ref 80.0–100.0)
Monocytes Absolute: 1.3 10*3/uL — ABNORMAL HIGH (ref 0.1–1.0)
Monocytes Relative: 13 %
Neutro Abs: 5.4 10*3/uL (ref 1.7–7.7)
Neutrophils Relative %: 53 %
Platelet Count: 627 10*3/uL — ABNORMAL HIGH (ref 150–400)
RBC: 3.68 MIL/uL — ABNORMAL LOW (ref 3.87–5.11)
RDW: 19 % — ABNORMAL HIGH (ref 11.5–15.5)
WBC Count: 10.1 10*3/uL (ref 4.0–10.5)
nRBC: 0 % (ref 0.0–0.2)

## 2020-04-11 MED ORDER — SODIUM CHLORIDE 0.9% FLUSH
10.0000 mL | Freq: Once | INTRAVENOUS | Status: AC
  Administered 2020-04-11: 10 mL
  Filled 2020-04-11: qty 10

## 2020-04-11 MED ORDER — HEPARIN SOD (PORK) LOCK FLUSH 100 UNIT/ML IV SOLN
INTRAVENOUS | Status: AC
  Filled 2020-04-11: qty 5

## 2020-04-11 MED ORDER — HEPARIN SOD (PORK) LOCK FLUSH 100 UNIT/ML IV SOLN
500.0000 [IU] | Freq: Once | INTRAVENOUS | Status: AC
  Administered 2020-04-11: 500 [IU] via INTRAVENOUS

## 2020-04-11 MED ORDER — IOHEXOL 300 MG/ML  SOLN
100.0000 mL | Freq: Once | INTRAMUSCULAR | Status: AC | PRN
  Administered 2020-04-11: 100 mL via INTRAVENOUS

## 2020-04-11 NOTE — Assessment & Plan Note (Signed)
This is likely due to recent treatment. The patient denies recent history of bleeding such as epistaxis, hematuria or hematochezia. She is asymptomatic from the anemia. I will observe for now.  She does not require transfusion now. 

## 2020-04-11 NOTE — Assessment & Plan Note (Signed)
I have reviewed multiple CT imaging with the patient Overall, she have stable disease control The peritoneal nodule and the liver lesion appears somewhat stable/reduced in size For now, we will continue treatment Due to history of recurrent infection and elevated liver enzymes, I recommend modified schedule for treatment to be given every other week She is in agreement to proceed with the plan of care I will see her every other treatment I plan to recheck another CT imaging in 3 months, due around May

## 2020-04-11 NOTE — Assessment & Plan Note (Signed)
She denies worsening peripheral neuropathy recently She will continue current dose of gabapentin

## 2020-04-11 NOTE — Assessment & Plan Note (Signed)
The patient is noted to have gained a lot of weight She is attempting to reduce her prednisone intake I have to adjust the dose of her treatment because of recent weight gain

## 2020-04-11 NOTE — Progress Notes (Signed)
[place] OFFICE PROGRESS NOTE  Patient Care Team: [name], MD as PCP - General (Family Medicine)  ASSESSMENT & PLAN:  Malignant neoplasm of tail of pancreas [facility]) I have reviewed multiple CT imaging with the patient Overall, she have stable disease control The peritoneal nodule and the liver lesion appears somewhat stable/reduced in size For now, we will continue treatment Due to history of recurrent infection and elevated liver enzymes, I recommend modified schedule for treatment to be given every other week She is in agreement to proceed with the plan of care I will see her every other treatment I plan to recheck another CT imaging in 3 months, due around May  Elevated liver enzymes The cause of elevated liver enzymes are multifactorial, likely related to her fatty liver change and side effects of chemo She is not symptomatic We will continue with mild dose adjustment as before and treatment every other week We discussed the importance of dietary modification and weight loss  Anemia due to antineoplastic chemotherapy This is likely due to recent treatment. The patient denies recent history of bleeding such as epistaxis, hematuria or hematochezia. She is asymptomatic from the anemia. I will observe for now.  She does not require transfusion now.   Peripheral neuropathy due to chemotherapy [facility]) She denies worsening peripheral neuropathy recently She will continue current dose of gabapentin  Steroid-induced diabetes ([place]) The patient is noted to have gained a lot of weight She is attempting to reduce her prednisone intake I have to adjust the dose of her treatment because of recent weight gain    No orders of the defined types were placed in this encounter.   All questions were answered. The patient knows to call the clinic with any problems, questions or concerns. The total time spent in the appointment was 30 minutes encounter with patients including  review of chart and various tests results, discussions about plan of care and coordination of care plan   Heath Lark, MD 04/11/2020 1:39 PM  INTERVAL HISTORY: Please see below for problem oriented charting. 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 Mixed attenuation mass emanates from the right adnexa measuring 10.8 x 8.0 cm very suspicious for right ovarian carcinoma. 2. Abnormality of the tail of the pancreas may be due to mild pancreatitis and small pseudocyst formation, but neoplasm cannot be excluded. 3. Small amount of ascites within abdomen and pelvis. 4. Small nonobstructing renal calculi bilaterally.    08/30/2017 Pathology Results   1. Uterus and cervix, with left fallopian tube - ENDOMETRIOID ADENOCARCINOMA, FIGO GRADE I, ARISING IN A BACKGROUND OF DIFFUSE COMPLEX ATYPICAL HYPERPLASIA. - CARCINOMA INVADES FOR OF DEPTH OF 0.2 CM WHERE THICKNESS OF MYOMETRIAL WALL IS 2.1 CM. - ALL RESECTION MARGINS ARE NEGATIVE FOR CARCINOMA. - NEGATIVE FOR LYMPHOVASCULAR OR PERINEURAL INVASION. - CERVICAL STROMA IS NOT INVOLVED. - SEE ONCOLOGY TABLE. - SEE NOTE 2. Ovary and fallopian tube, right - PRIMARY OVARIAN ENDOMETRIOID ADENOCARCINOMA, FIGO GRADE II, 12 CM. - THE OVARIAN SURFACE IS FOCALLY  INVOLVED BY CARCINOMA. - NEGATIVE FOR LYMPHOVASCULAR INVASION. - BENIGN UNREMARKABLE FALLOPIAN TUBE, NEGATIVE FOR CARCINOMA. - SEE ONCOLOGY TABLE. - SEE NOTE 3. Cul-de-sac  biopsy - METASTATIC ADENOCARCINOMA, MOST CONSISTENT WITH PRIMARY OVARIAN ENDOMETRIOID ADENOCARCINOMA. 4. Ovary, left - BENIGN UNREMARKABLE OVARY, NEGATIVE FOR MALIGNANCY. Microscopic Comment 1. UTERUS, CARCINOMA OR CARCINOSARCOMA Procedure: Total hysterectomy with bilateral salpingo-oophorectomy. Histologic type: Endometrioid adenocarcinoma. Histologic Grade: FIGO Grade I Myometrial invasion: Depth of invasion: 2 mm Myometrial thickness: 21 mm Uterine Serosa Involvement: Not identified Cervical stromal involvement: Not identified Extent of involvement of other organs: Not applicable Lymphovascular invasion: Not identified Regional Lymph Nodes: Examined: 0 Sentinel 0 Non-sentinel 0 Total Tumor block for ancillary studies: 1H MMR / MSI testing: Pending Pathologic Stage Classification (pTNM, AJCC 8th edition): pT1a, pNX (v4.1.0.0) 2. OVARY or FALLOPIAN TUBE or PRIMARY PERITONEUM: Procedure: Salpingo-oophorectomy Specimen Integrity: Intact Tumor Site: Right ovary Ovarian Surface Involvement (required only if applicable): Focally involved by carcinoma Fallopian Tube Surface Involvement (required only if applicable): Not identified Tumor Size: 12 cm Histologic Type: Endometrioid adenocarcinoma Histologic Grade: Grade II Implants (required for advanced stage serous/seromucinous borderline tumors only): Not applicable Other Tissue/ Organ Involvement: Cul de sac biopsy involved by tumor Largest Extrapelvic Peritoneal Focus (required only if applicable): Not applicable Peritoneal/Ascitic Fluid: Negative for carcinoma (case # [id number]) Treatment Effect (required only for high-grade serous carcinomas): Not applicable Regional Lymph Nodes: No lymph nodes submitted or found Number of Lymph Nodes Examined: 0 Pathologic Stage Classification (pTNM, AJCC 8th Edition): pT2b, pN0 Representative Tumor Block: 2B and 2E 1. Molecular study for microsatellite instability and immunohistochemical  stains for MMR-related proteins are pending and will be reported in an addendum. 2. Immunohistochemical stain show that the ovarian tumor is positive for CK7 and PAX8 (both diffuse), CDX2 (patchy and weak); and negative for CK20. This immunoprofile is consistent with the above diagnosis. Dr. Lyndon Code has reviewed this case and concurs with the above diagnosis. Molecular study for microsatellite instability and immunohistochemical stains for MMR-related proteins are pending and will be reported in an addendum   08/30/2017 Genetic Testing   Patient has genetic testing done for MSI  Results revealed patient has the following mutation(s): loss of [facility] 2   08/30/2017 Surgery   Surgeon: Mart Piggs, MD Pre-operative Diagnosis:  1. Adnexal mass 2. Abnormal uterine bleeding 3. H/o Cecal CA  Post-operative Diagnosis:  1. Adhesive disease post colon resection 2. Endometrial cancer NOS 3. Adenocarcinoma unknown origin, right ovary, suspicious for GI primary  Operation:  1. Lysis of adhesions ~30 minutes 2. Robotic-assisted laparoscopic total hysterectomy with right salpingo-oophorectomy and left salpingectomy 3. Left oophorectomy (RA-laparoscopic) 4. Pelvic washings  Findings: Adhesions of omentum to anterior abdominal wall. Enlarged cystic right ovary ~10cm. Uterus had small nodules on serosa near where right adnexa was intimate with the surface. No obvious intraoperative rupture of cyst, although in 2 areas the wall was thin and one of these areas had some bleeding. Slight scarring of left bladder dome to LUS/cervix. Uterus on frozen section c/w hyperplasia and a small focus of endometrial CA - no myo invasion, <2cm in size. Frozen section on the right adnexa was carcinoma, met from colon or possibly Gyn, favor GI primary, defer to permanent. Left ovary was WNL.     09/15/2017 Cancer Staging   Staging form: Corpus Uteri - Carcinoma and Carcinosarcoma, AJCC 8th Edition - Pathologic: FIGO  Stage IA (pT1a, pN0, cM0) - Signed by Heath Lark, MD on 09/15/2017   09/26/2017 Procedure   Successful placement  of a right internal jugular approach power injectable Port-A-Cath. The catheter is ready for immediate use.   11/07/2017 Imaging   1. Since 08/18/2017, similar to slight decrease in size of a pancreatic body/tail junction lesion. Cross modality comparison relative to 09/16/2017 MRI is also grossly similar. 2. No evidence of metastatic disease. 3. Aortic Atherosclerosis (ICD10-I70.0).  4. Left nephrolithiasis.   11/18/2017 Genetic Testing   MSH2 c.2237dupT pathogenic mutation identified in the CancerNext panel.  The CancerNext gene panel offered by [employer] includes sequencing and rearrangement analysis for the following 34 genes:   APC, ATM, BARD1, BMPR1A, BRCA1, BRCA2, BRIP1, CDH1, CDK4, CDKN2A, CHEK2, DICER1, HOXB13, EPCAM, GREM1, MLH1, MRE11A, MSH2, MSH6, MUTYH, NBN, NF1, PALB2, PMS2, POLD1, POLE, PTEN, RAD50, RAD51C, RAD51D, SMAD4, SMARCA4, STK11, and TP53.  The report date is November 18, 2017.  MSH2 [id number] pathogenic mutation identified on somatic testing.  These results are consistent with a diagnosis of Lynch syndrome.   06/18/2019 - 08/14/2019 Chemotherapy   The patient had FOLFIRINOX for chemotherapy treatment.     01/19/2020 -  Chemotherapy    Patient is on Treatment Plan: PANCREATIC ABRAXANE / GEMCITABINE D1,8,15 Q28D      04/11/2020 Imaging   1. Essentially stable appearance of the soft tissue density process along the distal pancreatectomy site, abutting the posterior portion of the stomach, celiac trunk, left lateral margin of the SMA, and potentially the superior margin of the left renal vein. This could be inflammatory but residual tumor not excluded. 2. Some of the liver lesions are mildly reduced in size and some of the liver lesions are stable in size. 3. Further reduction in omental nodularity. 4. Small paraumbilical hernia contains a loop  of small bowel, without findings of strangulation or obstruction. 5. Small supraumbilical hernias containing adipose tissue. 6. Small nonobstructive left renal calculus.   Secondary malignant neoplasm of right ovary ([place])  08/23/2017 Tumor Marker   Patient's tumor was tested for the following markers: CA-125 Results of the tumor marker test revealed 139.8   08/31/2017 Initial Diagnosis   Secondary malignant neoplasm of right ovary ([place])   09/15/2017 Cancer Staging   Staging form: Ovary, Fallopian Tube, and Primary Peritoneal Carcinoma, AJCC 8th Edition - Pathologic: Stage IIB (pT2b, pN0, cM0) - Signed by Heath Lark, MD on 09/15/2017   09/27/2017 Imaging   No evidence of metastatic disease or other acute findings within the thorax.  4 cm low-attenuation mass in pancreatic tail, highly suspicious for pancreatic carcinoma. This is caused splenic vein thrombosis, with new venous collaterals in the left upper quadrant. Consider endoscopic ultrasound with FNA for tissue diagnosis.  Stable benign hepatic hemangioma.    09/27/2017 Tumor Marker   Patient's tumor was tested for the following markers: CA-125 Results of the tumor marker test revealed 39.4   11/02/2017 Tumor Marker   Patient's tumor was tested for the following markers: CA-125 Results of the tumor marker test revealed 19   11/07/2017 Tumor Marker   Patient's tumor was tested for the following markers: CA-125 Results of the tumor marker test revealed 18.3   11/18/2017 Genetic Testing   MSH2 c.2237dupT pathogenic mutation identified in the CancerNext panel.  The CancerNext gene panel offered by [employer] includes sequencing and rearrangement analysis for the following 34 genes:   APC, ATM, BARD1, BMPR1A, BRCA1, BRCA2, BRIP1, CDH1, CDK4, CDKN2A, CHEK2, DICER1, HOXB13, EPCAM, GREM1, MLH1, MRE11A, MSH2, MSH6, MUTYH, NBN, NF1, PALB2, PMS2, POLD1, POLE, PTEN, RAD50, RAD51C, RAD51D, SMAD4, SMARCA4, STK11, and TP53.  The  report date is  November 18, 2017.  MSH2 [id number] pathogenic mutation identified on somatic testing.  These results are consistent with a diagnosis of Lynch syndrome.   12/15/2017 Tumor Marker   Patient's tumor was tested for the following markers: CA-125 Results of the tumor marker test revealed 57.3   01/16/2018 Tumor Marker   Patient's tumor was tested for the following markers: CA-125 Results of the tumor marker test revealed 24.2   04/10/2018 Tumor Marker   Patient's tumor was tested for the following markers: CA-125 Results of the tumor marker test revealed 18.2   07/12/2018 Tumor Marker   Patient's tumor was tested for the following markers: CA-125 Results of the tumor marker test revealed 11.8   10/16/2018 Tumor Marker   Patient's tumor was tested for the following markers: CA125 Results of the tumor marker test revealed 12.4.   01/18/2020 Tumor Marker   Patient's tumor was tested for the following markers: CA-125 Results of the tumor marker test revealed 18   Malignant neoplasm of tail of pancreas ([place])  10/13/2017 Pathology Results   Pancreas tail mass, endoscopic ultrasound-guided, fine needle aspiration II (smears and cell block): Adenocarcinoma   11/18/2017 Genetic Testing   MSH2 c.2237dupT pathogenic mutation identified in the CancerNext panel.  The CancerNext gene panel offered by [employer] includes sequencing and rearrangement analysis for the following 34 genes:   APC, ATM, BARD1, BMPR1A, BRCA1, BRCA2, BRIP1, CDH1, CDK4, CDKN2A, CHEK2, DICER1, HOXB13, EPCAM, GREM1, MLH1, MRE11A, MSH2, MSH6, MUTYH, NBN, NF1, PALB2, PMS2, POLD1, POLE, PTEN, RAD50, RAD51C, RAD51D, SMAD4, SMARCA4, STK11, and TP53.  The report date is November 18, 2017.  MSH2 [id number] pathogenic mutation identified on somatic testing.  These results are consistent with a diagnosis of Lynch syndrome.   11/24/2017 Pathology Results   A. "TAIL OF PANCREAS AND SPLEEN", DISTAL  PANCREATECTOMY AND SPLENECTOMY: Invasive ductal adenocarcinoma, moderately to poorly differentiated with focal signet ring cell features, of pancreas (distal).   The carcinoma is 2.5 cm in greatest dimension grossly.   Treatment effects present in the form of fibrosis (50%). No lymphovascular or definite perineural invasion identified. All surgical margins are negative for tumor or high-grade dysplasia. Adjacent mucinous neoplasm, most consistent with Intraductal papillary mucinous neoplasm (IPMN) with low-grade dysplasia.   Uninvolved pancreas show atrophy and focal acute inflammation.   Twelve benign lymph nodes (0/12). Spleen with no significant histopathologic abnormalities.  PROCEDURE: distal pancreatectomy and splenectomy TUMOR SITE: distal pancreas TUMOR SIZE:  GREATEST DIMENSION: 2.5 cm  ADDITIONAL DIMENSIONS:  x  cm HISTOLOGIC TYPE: ductal adenocarcinoma HISTOLOGIC GRADE: grade 3 TUMOR EXTENSION: peripancreatic soft tissue MARGINS: negative for tumor TREATMENT EFFECT: treatment effects present in the form of fibrosis (50%). LYMPHOVASCULAR INVASION: not identified PERINEURAL INVASION: no definite evidence REGIONAL LYMPH NODES:   NUMBER OF LYMPH NODES INVOLVED: 0   NUMBER OF LYMPH NODES EXAMINED: 12 PATHOLOGIC STAGE CLASSIFICATION (pTNM, AJCC 8th Ed): ypT2, ypN0 DISTANT METASTASIS (pM): pMx ADDITIONAL PATHOLOGIC FINDINGS: mucinous neoplasm, most consistent with intraductal papillary mucinous neoplasm (IPMN) with low-grade dysplasia, is identified adjacent to the main tumor.   11/24/2017 Cancer Staging   Staging form: Exocrine Pancreas, AJCC 8th Edition - Pathologic stage from 11/24/2017: Stage IB (pT2, pN0, cM0) - Signed by Truitt Merle, MD on 01/03/2018   11/25/2017 Surgery   She had surgery at [facility] 1. Exploratory Laparotomy 2. Distal Pancreatectomy and Splenectomy 3. Intraoperative Ultrasound 4. Open  Cholecystectomy    12/15/2017 Cancer Staging   Staging form: Exocrine Pancreas, AJCC 8th Edition -  Clinical: Stage IB (cT2, cN0, cM0) - Signed by Heath Lark, MD on 12/15/2017   12/28/2017 Imaging   12/28/2017 CT Abdomen  IMPRESSION: 1. Postoperative findings from recent partial pancreatectomy including a 21 cubic cm fluid collection along the pancreatic resection margin which could represent early pseudocyst. 2. Nodularity along the lateral limb of the left adrenal gland could also be postoperative but merit surveillance, as the pancreatic lesion was in close proximity to this adrenal gland on the prior CT of 11/07/2017. 3. Asymmetric fullness inferiorly in the left breast. The patient has a history of prior left breast procedures, correlation with mammography is recommended. 4. Other imaging findings of potential clinical significance: Stable hemangioma in the left hepatic lobe. Splenectomy. Aortic Atherosclerosis (ICD10-I70.0). Right hemicolectomy. Small focus of fat necrosis in the right anterior abdominal wall subcutaneous tissues near the laparotomy site. Bilateral nonobstructive nephrolithiasis.   01/02/2018 Tumor Marker   Patient's tumor was tested for the following markers: CA-19-9 Results of the tumor marker test revealed 5   01/03/2018 - 06/05/2018 Chemotherapy   She received modified dose FOLFIRINOX   04/10/2018 Imaging   1. Distal pancreatectomy, without findings of recurrent or metastatic disease. 2. Incompletely imaged hypoenhancement within lower lobe right pulmonary artery branch is likely chronic (but interval since 10/09/2017) pulmonary embolism. Dedicated CTA could further evaluate. 3. Decrease in size of a peripancreatic complex fluid collection anteriorly, likely a resolving pseudocyst. 4. Decreased size of minimal fluid versus a borderline sized node in the gastrohepatic ligament. Recommend attention on follow-up. 5. Hepatic steatosis with a segment 4 hemangioma. 6.  Aortic Atherosclerosis (ICD10-I70.0). This is significantly age advanced.   07/12/2018 Tumor Marker   Patient's tumor was tested for the following markers: CA-19-9 Results of the tumor marker test revealed 6   07/12/2018 Imaging   1. Status post distal pancreatectomy with stable postoperative fluid collection adjacent to the ventral aspect of the pancreatic head. No findings to suggest metastatic disease in the abdomen or pelvis. 2. Hepatic steatosis with small cavernous hemangioma in segment 4A of the liver. 3. Slight decreased size of nonenlarged gastrohepatic ligament lymph node, presumably benign. 4. Aortic atherosclerosis.    10/16/2018 Imaging   Status post distal pancreatectomy. Stable postoperative seroma along the surgical margin.   Status post hysterectomy and right oophorectomy.   Status post right hemicolectomy with appendectomy.   No evidence of recurrent or metastatic disease.   No colonic wall thickening or mass is evident on CT.   01/22/2019 Tumor Marker   Patient's tumor was tested for the following markers: CA-19.9 Results of the tumor marker test revealed 5   01/22/2019 Imaging   1. No evidence of local recurrence or metastatic disease status post distal pancreatectomy and splenectomy. 2. Stable small seroma anterior to the pancreatic head. 3. Postsurgical changes as described. 4. Stable additional incidental findings including a hepatic hemangioma, nonobstructing bilateral renal calculi and aortic Atherosclerosis (ICD10-I70.0).   04/18/2019 Imaging   1. Status post distal pancreatectomy and splenectomy. There has been a gradual increase in ill-defined soft tissue and celiac axis/SMA origin lymph nodes adjacent to the distal pancreatectomy margin and lesser curvature of the stomach/gastric body over sequential prior examinations dated 01/22/2019 and 09/26/2018. An enlarged lymph node or soft tissue nodule adjacent to the SMA origin now measures 1.8 x 0.8 cm.  Findings are concerning for local recurrence of pancreatic malignancy. 2. Separately from the above findings, there remains a 1.0 cm low-attenuation nodule anterior to the remnant pancreatic neck, most consistent with postoperative  seroma 3. No evidence of distant metastatic disease in the chest, abdomen, or pelvis. 4. Postoperative findings of cholecystectomy, right hemicolectomy, and hysterectomy. 5. Bilateral nonobstructive nephrolithiasis. 6.  Aortic Atherosclerosis (ICD10-I70.0).       05/28/2019 Tumor Marker   Patient's tumor was tested for the following markers: CA19-9 Results of the tumor marker test revealed 18   05/28/2019 Imaging   1. Status post distal pancreatectomy with continued further progression of the ill-defined soft tissue to the left of the celiac axis/SMA origin, now measuring 1.9 x 1.9 cm and highly concerning for recurrent/metastatic disease. This process abuts both the celiac axis, SMA, and posterior wall of the mid stomach. PET-CT may prove helpful to further evaluate. 2. Bandlike ill-defined soft tissue in the anterior abdomen, potentially peritoneal or omental is similar to perhaps minimally increased qualitatively in the interval. Close attention on follow-up recommended. 3. Left paraumbilical hernia contains a short segment of small bowel without complicating features. 4. Ill-defined very subtle area of decreased attenuation in the posterior aspect of hepatic segment IV. This is likely focal fatty deposition. Close attention on follow-up recommended. 5.  Aortic Atherosclerois (ICD10-170.0)   06/07/2019 Pathology Results   Malignant cells consistent with adenocarcinoma   06/07/2019 Procedure   ENDOSCOPIC FINDING (limited views with linear echoendoscope): :      The examined esophagus was endoscopically normal.      The entire examined stomach was endoscopically normal.      ENDOSONOGRAPHIC FINDING: :      1. An irregular mass was identified in the region of the  pancreatic tail resection site. The mass was stellate with very poorly defined borders. Fine needle aspiration for cytology was performed. Color Doppler imaging was utilized prior to needle puncture to confirm a lack of significant vascular structures within the needle path. Four passes were made with the 25 gauge (FNB) needle using a transgastric approach. A cytotechnologist was present to evaluate the adequacy of the specimen. Final cytology results are pending. Impression:               -  Irregular, stellate soft tissue mass in the region of the pancreatic tail resection site. This was sampled with four transgastric passes with an EUS FNB needle..   06/18/2019 - 08/14/2019 Chemotherapy   The patient had FOLFIRINOX for chemotherapy treatment.     09/03/2019 Imaging   1. Findings are again highly concerning for locally recurrent disease adjacent to the pancreatectomy bed, with enlarging soft tissue mass which is intimately associated with the superior mesenteric artery, celiac axis and superior aspect of the left renal  vein, as detailed above. 2. No evidence of metastatic disease in the thorax. 3. 2.7 x 1.2 cm cavernous hemangioma in segment 4A of the liver again noted. 4. Small nonobstructive calculi in the collecting systems of both kidneys measuring up to 3 mm in the lower pole collecting system of the left kidney. 5. Aortic atherosclerosis. 6. Additional incidental findings, as above.   11/16/2019 Imaging   Similar-appearing ill-defined soft tissue the left of the celiac axis/SMA, most compatible with recurrent/metastatic disease.   Similar ill-defined nodularity within the anterior upper abdomen, potentially peritoneal or omental. Continued attention on follow-up is recommended.   12/31/2019 Tumor Marker   Patient's tumor was tested for the following markers: CA-19-9 Results of the tumor marker test revealed 298   01/10/2020 Imaging   1. Numerous small hypoenhancing liver masses  scattered throughout the liver, all new since 11/16/2019 CT, compatible with new liver  metastases. 2. Stable upper retroperitoneal mass encasing the celiac axis and abutting the pancreatic resection margin, compatible with stable locally recurrent tumor. 3. Stable subcentimeter indistinct anterior upper peritoneal implants. 4. No evidence of metastatic disease in the chest. 5. Chronic findings include: Nonobstructing left nephrolithiasis. Stable small left paraumbilical hernia containing a small bowel loop without acute bowel complication. Aortic Atherosclerosis (ICD10-I70.0).     01/19/2020 -  Chemotherapy    Patient is on Treatment Plan: PANCREATIC ABRAXANE / GEMCITABINE D1,8,15 Q28D      01/25/2020 Tumor Marker   Patient's tumor was tested for the following markers: CA-19-9 Results of the tumor marker test revealed 1104.   03/04/2020 Tumor Marker   Patient's tumor was tested for the following markers: [id number] Results of the tumor marker test revealed 581   04/11/2020 Imaging   1. Essentially stable appearance of the soft tissue density process along the distal pancreatectomy site, abutting the posterior portion of the stomach, celiac trunk, left lateral margin of the SMA, and potentially the superior margin of the left renal vein. This could be inflammatory but residual tumor not excluded. 2. Some of the liver lesions are mildly reduced in size and some of the liver lesions are stable in size. 3. Further reduction in omental nodularity. 4. Small paraumbilical hernia contains a loop of small bowel, without findings of strangulation or obstruction. 5. Small supraumbilical hernias containing adipose tissue. 6. Small nonobstructive left renal calculus.     REVIEW OF SYSTEMS:   Constitutional: Denies fevers, chills or abnormal weight loss Eyes: Denies blurriness of vision Ears, nose, mouth, throat, and face: Denies mucositis or sore throat Respiratory: Denies cough, dyspnea or  wheezes Cardiovascular: Denies palpitation, chest discomfort or lower extremity swelling Skin: Denies abnormal skin rashes Lymphatics: Denies new lymphadenopathy or easy bruising Neurological:Denies numbness, tingling or new weaknesses Behavioral/Psych: Mood is stable, no new changes  All other systems were reviewed with the patient and are negative.  I have reviewed the past medical history, past surgical history, social history and family history with the patient and they are unchanged from previous note.  ALLERGIES:  is allergic to codeine, penicillins, and bactrim [sulfamethoxazole-trimethoprim].  MEDICATIONS:  Current Outpatient Medications  Medication Sig Dispense Refill  . cetirizine (ZYRTEC) 10 MG tablet Take 10 mg by mouth daily.    . citalopram (CELEXA) 20 MG tablet TAKE 1 TABLET BY MOUTH EVERY DAY 90 tablet 4  . CREON 24000-76000 units CPEP Take 1 capsule by mouth See admin instructions. Take 1 capsule before each meal and 1 capsule after each meal, take 1 cap with snacks    . diclofenac Sodium (VOLTAREN) 1 % GEL APPLY 2-4 GRAMS TO AFFECTED JOINT 4 TIMES DAILY AS NEEDED. 400 g 1  . estradiol (ESTRACE VAGINAL) 0.1 MG/GM vaginal cream Place 1 Applicatorful vaginally 3 (three) times a week. 42.5 g 12  . fluticasone (FLONASE) 50 MCG/ACT nasal spray Place 1 spray into both nostrils daily.    Marland Kitchen gabapentin (NEURONTIN) 300 MG capsule Take 1 capsule (300 mg total) by mouth 3 (three) times daily. 90 capsule 11  . HYDROmorphone (DILAUDID) 4 MG tablet Take 1 tablet (4 mg total) by mouth every 4 (four) hours as needed. 60 tablet 0  . hydrOXYzine (ATARAX/VISTARIL) 10 MG tablet Take 1 tablet (10 mg total) by mouth 3 (three) times daily as needed. 30 tablet 1  . lidocaine-prilocaine (EMLA) cream Apply 1 application topically daily as needed (port). Apply to affected area once 30 g 11  . LORazepam (  ATIVAN) 0.5 MG tablet Take 1 tablet (0.5 mg total) by mouth every 8 (eight) hours as needed for  anxiety (nausea). 30 tablet 0  . metFORMIN (GLUCOPHAGE) 500 MG tablet TAKE 1 TABLET BY MOUTH TWICE A DAY WITH A MEAL 180 tablet 11  . methocarbamol (ROBAXIN) 500 MG tablet TAKE 1 TABLET BY MOUTH THREE TIMES A DAY AS NEEDED FOR MUSCLE SPASMS 90 tablet 0  . Multiple Vitamin (MULTIVITAMIN WITH MINERALS) TABS tablet Take 1 tablet by mouth daily.    Marland Kitchen omeprazole (PRILOSEC) 40 MG capsule TAKE 1 CAPSULE BY MOUTH EVERY DAY 90 capsule 3  . ondansetron (ZOFRAN) 8 MG tablet TAKE 1 TAB BY MOUTH EVERY 8HOURS AS NEEDED FOR REFRACTORY NAUSEA/VOMITING START ON DAY 3AFTER CHEMO 8 tablet 7  . predniSONE (DELTASONE) 5 MG tablet TAKE 2 TABLETS BY MOUTH DAILY WITH BREAKFAST. 60 tablet 2  . prochlorperazine (COMPAZINE) 10 MG tablet TAKE 1 TABLET BY MOUTH EVERY 6 HOURS AS NEEDED 90 tablet 3  . rivaroxaban (XARELTO) 20 MG TABS tablet Take 1 tablet (20 mg total) by mouth daily with supper. 30 tablet 9  . simethicone (MYLICON) 826 MG chewable tablet Chew 125 mg by mouth every 6 (six) hours as needed for flatulence.     No current facility-administered medications for this visit.   Facility-Administered Medications Ordered in Other Visits  Medication Dose Route Frequency Provider Last Rate Last Admin  . heparin lock flush 100 UNIT/ML injection             PHYSICAL EXAMINATION: ECOG PERFORMANCE STATUS: 1 - Symptomatic but completely ambulatory  Vitals:   04/11/20 1309  BP: 119/90  Pulse: 89  Resp: 18  Temp: 98.6 F (37 C)  SpO2: 96%   Filed Weights   04/11/20 1309  Weight: 226 lb 9.6 oz (102.8 kg)    GENERAL:alert, no distress and comfortable NEURO: alert & oriented x 3 with fluent speech, no focal motor/sensory deficits  LABORATORY DATA:  I have reviewed the data as listed    Component Value Date/Time   NA 139 04/11/2020 0755   K 4.3 04/11/2020 0755   CL 104 04/11/2020 0755   CO2 26 04/11/2020 0755   GLUCOSE 102 (H) 04/11/2020 0755   BUN 12 04/11/2020 0755   CREATININE 0.67 04/11/2020 0755    CREATININE 0.68 07/27/2017 0851   CALCIUM 8.6 (L) 04/11/2020 0755   PROT 6.5 04/11/2020 0755   ALBUMIN 3.3 (L) 04/11/2020 0755   AST 27 04/11/2020 0755   ALT 50 (H) 04/11/2020 0755   ALKPHOS 89 04/11/2020 0755   BILITOT 0.3 04/11/2020 0755   GFRNONAA >60 04/11/2020 0755   GFRNONAA 106 07/27/2017 0851   GFRAA >60 11/09/2019 1012   GFRAA >60 09/27/2019 1535   GFRAA 123 07/27/2017 0851    No results found for: SPEP, UPEP  Lab Results  Component Value Date   WBC 10.1 04/11/2020   NEUTROABS 5.4 04/11/2020   HGB 10.9 (L) 04/11/2020   HCT 34.4 (L) 04/11/2020   MCV 93.5 04/11/2020   PLT 627 (H) 04/11/2020      Chemistry      Component Value Date/Time   NA 139 04/11/2020 0755   K 4.3 04/11/2020 0755   CL 104 04/11/2020 0755   CO2 26 04/11/2020 0755   BUN 12 04/11/2020 0755   CREATININE 0.67 04/11/2020 0755   CREATININE 0.68 07/27/2017 0851      Component Value Date/Time   CALCIUM 8.6 (L) 04/11/2020 0755   ALKPHOS 89 04/11/2020  0755   AST 27 04/11/2020 0755   ALT 50 (H) 04/11/2020 0755   BILITOT 0.3 04/11/2020 0755       RADIOGRAPHIC STUDIES: I have reviewed multiple CT imaging with the patient I have personally reviewed the radiological images as listed and agreed with the findings in the report. CT ABDOMEN PELVIS W CONTRAST  Result Date: 04/11/2020 CLINICAL DATA:  Pancreatic cancer, chemotherapy in progress. History of ovarian/uterine cancer remotely. Prior distal pancreatectomy and splenectomy. Prior colon resection. EXAM: CT ABDOMEN AND PELVIS WITH CONTRAST TECHNIQUE: Multidetector CT imaging of the abdomen and pelvis was performed using the standard protocol following bolus administration of intravenous contrast. CONTRAST:  143m OMNIPAQUE IOHEXOL 300 MG/ML  SOLN COMPARISON:  01/10/2020 FINDINGS: Lower chest: A central venous catheter terminates in the right atrium. Hepatobiliary: Scattered small hypodense liver lesions many of which demonstrate transient hepatic  attenuation difference. In general most of the liver lesions demonstrate some mild reduction in size and conspicuity. For example, a medial right hepatic lobe lesion measuring 1.2 by 0.9 cm on image 49 of series 7 previously measured 1.5 by 1.2 cm on the prior exam, and was more sharply defined. Some of the small lesions are stable and many of the lesions are below 1 cm in diameter. No new lesions identified. Cholecystectomy noted. Pancreas: Distal pancreatectomy with indistinct soft tissue density along the distal pancreatectomy site, some of which could be inflammatory but residual tumor not excluded. This region of density measures about 4.0 by 2.1 cm on image 53 of series 7, and when I measure in the same fashion on the prior exam I arrive at a measurement of 4.0 by 2.2 cm, essentially stable. This region abuts the posterior margin of the stomach. Spleen: Splenectomy Adrenals/Urinary Tract: 3 mm left kidney lower pole nonobstructive renal calculus. Otherwise unremarkable. Stomach/Bowel: The soft tissue density process along the distal pancreatectomy site abuts the posterior portion of the stomach as on the prior exam. A small paraumbilical hernia on image 96 of series 7 contains a loop of small bowel, without findings of strangulation or obstruction. Vascular/Lymphatic: The soft tissue density along the distal pancreatectomy site abuts the celiac trunk. This also abuts the left lateral margin of the SMA and potentially the superior margin of the left renal vein. Overall these relationships are not appreciably changed. Reproductive: Uterus absent. Adnexa unremarkable. Other: Reduced size and conspicuity of omental tumor implants. For example, a small omental nodule measuring 4 mm in short axis on image 46 of series 7 previously measured 6 mm in short axis. Other areas of indistinct nodularity have substantially improved and appear indistinct. Musculoskeletal: Small supraumbilical hernias containing adipose  tissue. A periumbilical hernia contains a loop of small bowel as noted above. IMPRESSION: 1. Essentially stable appearance of the soft tissue density process along the distal pancreatectomy site, abutting the posterior portion of the stomach, celiac trunk, left lateral margin of the SMA, and potentially the superior margin of the left renal vein. This could be inflammatory but residual tumor not excluded. 2. Some of the liver lesions are mildly reduced in size and some of the liver lesions are stable in size. 3. Further reduction in omental nodularity. 4. Small paraumbilical hernia contains a loop of small bowel, without findings of strangulation or obstruction. 5. Small supraumbilical hernias containing adipose tissue. 6. Small nonobstructive left renal calculus. Electronically Signed   By: WVan ClinesM.D.   On: 04/11/2020 12:53

## 2020-04-11 NOTE — Patient Instructions (Signed)

## 2020-04-11 NOTE — Assessment & Plan Note (Signed)
The cause of elevated liver enzymes are multifactorial, likely related to her fatty liver change and side effects of chemo She is not symptomatic We will continue with mild dose adjustment as before and treatment every other week We discussed the importance of dietary modification and weight loss

## 2020-04-12 LAB — CANCER ANTIGEN 19-9: CA 19-9: 451 U/mL — ABNORMAL HIGH (ref 0–35)

## 2020-04-12 LAB — CA 125: Cancer Antigen (CA) 125: 14 U/mL (ref 0.0–38.1)

## 2020-04-14 ENCOUNTER — Other Ambulatory Visit: Payer: BC Managed Care – PPO

## 2020-04-14 ENCOUNTER — Ambulatory Visit: Payer: BC Managed Care – PPO | Admitting: Hematology and Oncology

## 2020-04-14 ENCOUNTER — Ambulatory Visit: Payer: BC Managed Care – PPO

## 2020-04-14 DIAGNOSIS — C541 Malignant neoplasm of endometrium: Secondary | ICD-10-CM | POA: Diagnosis not present

## 2020-04-14 DIAGNOSIS — C252 Malignant neoplasm of tail of pancreas: Secondary | ICD-10-CM | POA: Diagnosis not present

## 2020-04-15 ENCOUNTER — Inpatient Hospital Stay: Payer: BC Managed Care – PPO

## 2020-04-15 ENCOUNTER — Other Ambulatory Visit: Payer: Self-pay

## 2020-04-15 VITALS — BP 122/88 | HR 79 | Temp 98.6°F | Resp 18 | Wt 225.8 lb

## 2020-04-15 DIAGNOSIS — Z7189 Other specified counseling: Secondary | ICD-10-CM

## 2020-04-15 DIAGNOSIS — D6481 Anemia due to antineoplastic chemotherapy: Secondary | ICD-10-CM | POA: Diagnosis not present

## 2020-04-15 DIAGNOSIS — C541 Malignant neoplasm of endometrium: Secondary | ICD-10-CM

## 2020-04-15 DIAGNOSIS — C7961 Secondary malignant neoplasm of right ovary: Secondary | ICD-10-CM | POA: Diagnosis not present

## 2020-04-15 DIAGNOSIS — C252 Malignant neoplasm of tail of pancreas: Secondary | ICD-10-CM | POA: Diagnosis not present

## 2020-04-15 DIAGNOSIS — Z5111 Encounter for antineoplastic chemotherapy: Secondary | ICD-10-CM | POA: Diagnosis not present

## 2020-04-15 MED ORDER — PACLITAXEL PROTEIN-BOUND CHEMO INJECTION 100 MG
100.0000 mg/m2 | Freq: Once | INTRAVENOUS | Status: AC
  Administered 2020-04-15: 200 mg via INTRAVENOUS
  Filled 2020-04-15: qty 40

## 2020-04-15 MED ORDER — HEPARIN SOD (PORK) LOCK FLUSH 100 UNIT/ML IV SOLN
500.0000 [IU] | Freq: Once | INTRAVENOUS | Status: AC | PRN
  Administered 2020-04-15: 500 [IU]
  Filled 2020-04-15: qty 5

## 2020-04-15 MED ORDER — ONDANSETRON HCL 4 MG/2ML IJ SOLN
INTRAMUSCULAR | Status: AC
  Filled 2020-04-15: qty 4

## 2020-04-15 MED ORDER — SODIUM CHLORIDE 0.9 % IV SOLN
800.0000 mg/m2 | Freq: Once | INTRAVENOUS | Status: AC
  Administered 2020-04-15: 1710 mg via INTRAVENOUS
  Filled 2020-04-15: qty 44.97

## 2020-04-15 MED ORDER — ONDANSETRON HCL 4 MG/2ML IJ SOLN
8.0000 mg | Freq: Once | INTRAMUSCULAR | Status: AC
  Administered 2020-04-15: 8 mg via INTRAVENOUS

## 2020-04-15 MED ORDER — SODIUM CHLORIDE 0.9 % IV SOLN
Freq: Once | INTRAVENOUS | Status: AC
  Filled 2020-04-15: qty 250

## 2020-04-15 MED ORDER — SODIUM CHLORIDE 0.9% FLUSH
10.0000 mL | INTRAVENOUS | Status: DC | PRN
  Administered 2020-04-15: 10 mL
  Filled 2020-04-15: qty 10

## 2020-04-15 NOTE — Patient Instructions (Signed)
[facility] Discharge Instructions for Patients Receiving Chemotherapy  Today you received the following chemotherapy agents Paclitaxel, Gemcitabine  To help prevent nausea and vomiting after your treatment, we encourage you to take your nausea medication as directed. If you develop nausea and vomiting that is not controlled by your nausea medication, call the clinic.   BELOW ARE SYMPTOMS THAT SHOULD BE REPORTED IMMEDIATELY:  *FEVER GREATER THAN 100.5 F  *CHILLS WITH OR WITHOUT FEVER  NAUSEA AND VOMITING THAT IS NOT CONTROLLED WITH YOUR NAUSEA MEDICATION  *UNUSUAL SHORTNESS OF BREATH  *UNUSUAL BRUISING OR BLEEDING  TENDERNESS IN MOUTH AND THROAT WITH OR WITHOUT PRESENCE OF ULCERS  *URINARY PROBLEMS  *BOWEL PROBLEMS  UNUSUAL RASH Items with * indicate a potential emergency and should be followed up as soon as possible.  Feel free to call the clinic should you have any questions or concerns. The clinic phone number is [phone].  Please show the [facility] at check-in to the Emergency Department and triage nurse.

## 2020-04-18 ENCOUNTER — Telehealth: Payer: Self-pay

## 2020-04-18 ENCOUNTER — Encounter: Payer: Self-pay | Admitting: Hematology and Oncology

## 2020-04-18 NOTE — Telephone Encounter (Signed)
Called regarding mychart message. Offered appt with Sandi Mealy, PA today. She declined appt, she does not feel bad. The fever is coming down. She will call the office back if she wants appt. She will do a home Covid test and call back with the results.

## 2020-04-29 ENCOUNTER — Encounter: Payer: Self-pay | Admitting: Hematology and Oncology

## 2020-04-29 ENCOUNTER — Inpatient Hospital Stay: Payer: BC Managed Care – PPO | Attending: Hematology and Oncology

## 2020-04-29 ENCOUNTER — Other Ambulatory Visit: Payer: Self-pay | Admitting: Hematology and Oncology

## 2020-04-29 ENCOUNTER — Other Ambulatory Visit: Payer: Self-pay

## 2020-04-29 ENCOUNTER — Inpatient Hospital Stay: Payer: BC Managed Care – PPO

## 2020-04-29 DIAGNOSIS — Z5111 Encounter for antineoplastic chemotherapy: Secondary | ICD-10-CM | POA: Diagnosis not present

## 2020-04-29 DIAGNOSIS — C541 Malignant neoplasm of endometrium: Secondary | ICD-10-CM

## 2020-04-29 DIAGNOSIS — C252 Malignant neoplasm of tail of pancreas: Secondary | ICD-10-CM | POA: Diagnosis not present

## 2020-04-29 DIAGNOSIS — C7961 Secondary malignant neoplasm of right ovary: Secondary | ICD-10-CM

## 2020-04-29 DIAGNOSIS — Z7189 Other specified counseling: Secondary | ICD-10-CM

## 2020-04-29 LAB — CBC WITH DIFFERENTIAL (CANCER CENTER ONLY)
Abs Immature Granulocytes: 0.05 10*3/uL (ref 0.00–0.07)
Basophils Absolute: 0.1 10*3/uL (ref 0.0–0.1)
Basophils Relative: 1 %
Eosinophils Absolute: 0.1 10*3/uL (ref 0.0–0.5)
Eosinophils Relative: 1 %
HCT: 33.3 % — ABNORMAL LOW (ref 36.0–46.0)
Hemoglobin: 10.9 g/dL — ABNORMAL LOW (ref 12.0–15.0)
Immature Granulocytes: 1 %
Lymphocytes Relative: 13 %
Lymphs Abs: 1.3 10*3/uL (ref 0.7–4.0)
MCH: 30 pg (ref 26.0–34.0)
MCHC: 32.7 g/dL (ref 30.0–36.0)
MCV: 91.7 fL (ref 80.0–100.0)
Monocytes Absolute: 0.4 10*3/uL (ref 0.1–1.0)
Monocytes Relative: 4 %
Neutro Abs: 7.9 10*3/uL — ABNORMAL HIGH (ref 1.7–7.7)
Neutrophils Relative %: 80 %
Platelet Count: 587 10*3/uL — ABNORMAL HIGH (ref 150–400)
RBC: 3.63 MIL/uL — ABNORMAL LOW (ref 3.87–5.11)
RDW: 19.3 % — ABNORMAL HIGH (ref 11.5–15.5)
WBC Count: 9.8 10*3/uL (ref 4.0–10.5)
nRBC: 0 % (ref 0.0–0.2)

## 2020-04-29 LAB — CMP (CANCER CENTER ONLY)
ALT: 232 U/L — ABNORMAL HIGH (ref 0–44)
AST: 93 U/L — ABNORMAL HIGH (ref 15–41)
Albumin: 3.2 g/dL — ABNORMAL LOW (ref 3.5–5.0)
Alkaline Phosphatase: 206 U/L — ABNORMAL HIGH (ref 38–126)
Anion gap: 6 (ref 5–15)
BUN: 12 mg/dL (ref 6–20)
CO2: 27 mmol/L (ref 22–32)
Calcium: 8.6 mg/dL — ABNORMAL LOW (ref 8.9–10.3)
Chloride: 102 mmol/L (ref 98–111)
Creatinine: 0.7 mg/dL (ref 0.44–1.00)
GFR, Estimated: >60 mL/min (ref >60)
Glucose, Bld: 216 mg/dL — ABNORMAL HIGH (ref 70–99)
Potassium: 4.2 mmol/L (ref 3.5–5.1)
Sodium: 135 mmol/L (ref 135–145)
Total Bilirubin: 0.3 mg/dL (ref 0.3–1.2)
Total Protein: 6.7 g/dL (ref 6.5–8.1)

## 2020-04-29 MED ORDER — SODIUM CHLORIDE 0.9% FLUSH
10.0000 mL | Freq: Once | INTRAVENOUS | Status: AC
  Administered 2020-04-29: 10 mL
  Filled 2020-04-29: qty 10

## 2020-04-29 MED ORDER — HYDROMORPHONE HCL 4 MG PO TABS: 4.0000 mg | ORAL_TABLET | ORAL | 0 refills | Status: DC | PRN

## 2020-04-29 MED ORDER — HEPARIN SOD (PORK) LOCK FLUSH 100 UNIT/ML IV SOLN
500.0000 [IU] | Freq: Once | INTRAVENOUS | Status: AC
  Administered 2020-04-29: 500 [IU]
  Filled 2020-04-29: qty 5

## 2020-04-29 NOTE — Progress Notes (Signed)
Per Dr. Alvy Bimler, no treatment today due to elevated liver enzymes. Pt. to come in for next regular treatment in 2 weeks. Pt. Notified.

## 2020-04-30 LAB — CA 125: Cancer Antigen (CA) 125: 20.3 U/mL (ref 0.0–38.1)

## 2020-05-01 DIAGNOSIS — F418 Other specified anxiety disorders: Secondary | ICD-10-CM | POA: Diagnosis not present

## 2020-05-02 LAB — CANCER ANTIGEN 19-9: CA 19-9: 583 U/mL — ABNORMAL HIGH (ref 0–35)

## 2020-05-05 ENCOUNTER — Telehealth: Payer: Self-pay

## 2020-05-05 NOTE — Telephone Encounter (Signed)
She called and left a message to call her.  Called back. She is complaining of itching all over, that started sometime after 3/8 lab. She thinks the itching is due to elevated liver enzymes. She went to the beach the weekend before 3/8 and had x 2 glasses of wine.  She is taking benadryl  50 mg q 6 hours and Hydroxyzine. She is applying hydrocortisone cream. She started taking milk thistle because she heard that was good to take. She is drinking lots of water.  She is asking if she should do anything else?

## 2020-05-05 NOTE — Telephone Encounter (Signed)
If she does not have a rash, no need to put on cream She can Add pepcid 20 mg po twice daily

## 2020-05-05 NOTE — Telephone Encounter (Signed)
Called and given below message. She verbalized understanding. Denies rash. She will start Pepcid. Instructed to call the office back if needed.

## 2020-05-07 ENCOUNTER — Inpatient Hospital Stay: Payer: BC Managed Care – PPO

## 2020-05-07 ENCOUNTER — Inpatient Hospital Stay (HOSPITAL_BASED_OUTPATIENT_CLINIC_OR_DEPARTMENT_OTHER): Payer: BC Managed Care – PPO | Admitting: Hematology and Oncology

## 2020-05-07 ENCOUNTER — Telehealth: Payer: Self-pay

## 2020-05-07 ENCOUNTER — Other Ambulatory Visit: Payer: Self-pay

## 2020-05-07 ENCOUNTER — Encounter: Payer: Self-pay | Admitting: Hematology and Oncology

## 2020-05-07 DIAGNOSIS — T380X5D Adverse effect of glucocorticoids and synthetic analogues, subsequent encounter: Secondary | ICD-10-CM | POA: Diagnosis not present

## 2020-05-07 DIAGNOSIS — R748 Abnormal levels of other serum enzymes: Secondary | ICD-10-CM

## 2020-05-07 DIAGNOSIS — Z5111 Encounter for antineoplastic chemotherapy: Secondary | ICD-10-CM | POA: Diagnosis not present

## 2020-05-07 DIAGNOSIS — C541 Malignant neoplasm of endometrium: Secondary | ICD-10-CM

## 2020-05-07 DIAGNOSIS — E099 Drug or chemical induced diabetes mellitus without complications: Secondary | ICD-10-CM

## 2020-05-07 DIAGNOSIS — C252 Malignant neoplasm of tail of pancreas: Secondary | ICD-10-CM

## 2020-05-07 LAB — CBC WITH DIFFERENTIAL (CANCER CENTER ONLY)
Abs Immature Granulocytes: 0.1 10*3/uL — ABNORMAL HIGH (ref 0.00–0.07)
Basophils Absolute: 0.1 10*3/uL (ref 0.0–0.1)
Basophils Relative: 0 %
Eosinophils Absolute: 0 10*3/uL (ref 0.0–0.5)
Eosinophils Relative: 0 %
HCT: 37 % (ref 36.0–46.0)
Hemoglobin: 11.8 g/dL — ABNORMAL LOW (ref 12.0–15.0)
Immature Granulocytes: 1 %
Lymphocytes Relative: 8 %
Lymphs Abs: 1.3 10*3/uL (ref 0.7–4.0)
MCH: 29.8 pg (ref 26.0–34.0)
MCHC: 31.9 g/dL (ref 30.0–36.0)
MCV: 93.4 fL (ref 80.0–100.0)
Monocytes Absolute: 1.6 10*3/uL — ABNORMAL HIGH (ref 0.1–1.0)
Monocytes Relative: 10 %
Neutro Abs: 12.8 10*3/uL — ABNORMAL HIGH (ref 1.7–7.7)
Neutrophils Relative %: 81 %
Platelet Count: 701 10*3/uL — ABNORMAL HIGH (ref 150–400)
RBC: 3.96 MIL/uL (ref 3.87–5.11)
RDW: 18.7 % — ABNORMAL HIGH (ref 11.5–15.5)
WBC Count: 15.8 10*3/uL — ABNORMAL HIGH (ref 4.0–10.5)
nRBC: 0 % (ref 0.0–0.2)

## 2020-05-07 LAB — CMP (CANCER CENTER ONLY)
ALT: 239 U/L — ABNORMAL HIGH (ref 0–44)
AST: 72 U/L — ABNORMAL HIGH (ref 15–41)
Albumin: 3.5 g/dL (ref 3.5–5.0)
Alkaline Phosphatase: 232 U/L — ABNORMAL HIGH (ref 38–126)
Anion gap: 6 (ref 5–15)
BUN: 16 mg/dL (ref 6–20)
CO2: 26 mmol/L (ref 22–32)
Calcium: 9.3 mg/dL (ref 8.9–10.3)
Chloride: 102 mmol/L (ref 98–111)
Creatinine: 0.79 mg/dL (ref 0.44–1.00)
GFR, Estimated: >60 mL/min (ref >60)
Glucose, Bld: 264 mg/dL — ABNORMAL HIGH (ref 70–99)
Potassium: 4.2 mmol/L (ref 3.5–5.1)
Sodium: 134 mmol/L — ABNORMAL LOW (ref 135–145)
Total Bilirubin: 0.6 mg/dL (ref 0.3–1.2)
Total Protein: 7.3 g/dL (ref 6.5–8.1)

## 2020-05-07 MED ORDER — PREDNISONE 5 MG PO TABS: 5.0000 mg | ORAL_TABLET | Freq: Every day | ORAL | 2 refills | Status: DC

## 2020-05-07 MED ORDER — METFORMIN HCL 500 MG PO TABS: 1000.0000 mg | ORAL_TABLET | Freq: Two times a day (BID) | ORAL | 11 refills | Status: DC

## 2020-05-07 NOTE — Telephone Encounter (Signed)
Pt continues with constant itching all over body.    Pt reported itching last week.  Has been recommendations including Benadryl, Hydroxyzine, and Pepcid.    Pt denies any rashes.  Reports that when medication is in her system she feels some improvement, however the itching returns when dose is due of medication.  Pt verbalized concerns with itching related to elevated liver enzymes.  Pt reports have gallbladder removed in 2019, questioning if this could be related to bile duct blockage.  Will forward to MD for review for any additional recommendations.

## 2020-05-07 NOTE — Assessment & Plan Note (Addendum)
The cause of her liver enzymes elevation is multifactorial It is related to recent chemotherapy, along with recent alcohol ingestion and uncontrolled hyperglycemia, her liver enzymes are not improved compared to last visit I recommend restraint against further alcohol use I plan to treat her diabetes aggressively I plan to check her blood count again next week as scheduled

## 2020-05-07 NOTE — Assessment & Plan Note (Signed)
Unfortunately, she has persistent elevated liver enzymes and her recent treatment is placed on hold I gave the patient multiple suggestions on how to get her liver enzymes under control I will see her next week as scheduled

## 2020-05-07 NOTE — Assessment & Plan Note (Signed)
She has uncontrolled diabetes now I recommend the patient to reduce prednisone back to 5 mg daily I recommend increase Metformin to 1000 mg twice a day I recommend the patient to drink at least 90 to 100 ounces of water if she remains hyperglycemic, I plan to introduce hypoglycemic agent next week

## 2020-05-07 NOTE — Telephone Encounter (Signed)
Her bilirubin is not high so that goes against "bile duct blockage"  We can bring her in for labs and I can review results via virtual visit around 3pm if she can come in for labs this morning

## 2020-05-07 NOTE — Addendum Note (Signed)
Addended by: Rennis Harding on: 05/07/2020 10:00 AM   Modules accepted: Orders

## 2020-05-07 NOTE — Progress Notes (Signed)
HEMATOLOGY-ONCOLOGY ELECTRONIC VISIT PROGRESS NOTE  Patient Care Team: [name], MD as PCP - General (Family Medicine)  I connected with by telephone  ASSESSMENT & PLAN:  Malignant neoplasm of tail of pancreas ([place]) Unfortunately, she has persistent elevated liver enzymes and her recent treatment is placed on hold I gave the patient multiple suggestions on how to get her liver enzymes under control I will see her next week as scheduled  Elevated liver enzymes The cause of her liver enzymes elevation is multifactorial It is related to recent chemotherapy, along with recent alcohol ingestion and uncontrolled hyperglycemia, her liver enzymes are not improved compared to last visit I recommend restraint against further alcohol use I plan to treat her diabetes aggressively I plan to check her blood count again next week as scheduled  Steroid-induced diabetes ([place]) She has uncontrolled diabetes now I recommend the patient to reduce prednisone back to 5 mg daily I recommend increase Metformin to 1000 mg twice a day I recommend the patient to drink at least 90 to 100 ounces of water if she remains hyperglycemic, I plan to introduce hypoglycemic agent next week   No orders of the defined types were placed in this encounter.   INTERVAL HISTORY: Please see below for problem oriented charting. The purpose of today's visit is to address her skin itching She admits to treatment recently due to significant elevated liver enzymes The patient then consumed 2 glasses of alcohol not long after that and return for blood check which show significant elevated liver enzymes Recently, she increase the dose of prednisone for better control of joint pain and with the hope that it could help with her itching She started to have significant skin itching without rash over the past 7 to 10 days  SUMMARY OF ONCOLOGIC HISTORY: Oncology History Overview Note  MSI positive  Endometrial  :endometrioid Ovarian: 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 Mixed attenuation mass emanates from the right adnexa measuring 10.8 x 8.0 cm very suspicious for right ovarian carcinoma. 2. Abnormality of the tail of the pancreas may be due to mild pancreatitis and small pseudocyst formation, but neoplasm cannot be excluded. 3. Small amount of ascites within abdomen and pelvis. 4. Small nonobstructing renal calculi bilaterally.    08/30/2017 Pathology Results   1. Uterus and cervix, with left fallopian tube - ENDOMETRIOID ADENOCARCINOMA, FIGO GRADE I, ARISING IN A BACKGROUND OF DIFFUSE COMPLEX ATYPICAL HYPERPLASIA. - CARCINOMA INVADES FOR OF DEPTH OF 0.2 CM WHERE THICKNESS OF MYOMETRIAL WALL IS 2.1 CM. - ALL RESECTION MARGINS ARE NEGATIVE FOR CARCINOMA. - NEGATIVE FOR LYMPHOVASCULAR OR PERINEURAL INVASION. - CERVICAL STROMA IS NOT INVOLVED. - SEE ONCOLOGY TABLE. - SEE NOTE 2. Ovary and fallopian tube, right - PRIMARY OVARIAN ENDOMETRIOID ADENOCARCINOMA, FIGO GRADE II, 12 CM. - THE OVARIAN SURFACE IS FOCALLY INVOLVED BY CARCINOMA. - NEGATIVE FOR LYMPHOVASCULAR INVASION. - BENIGN UNREMARKABLE FALLOPIAN TUBE, NEGATIVE FOR CARCINOMA. - SEE ONCOLOGY TABLE. - SEE NOTE 3. Cul-de-sac biopsy - METASTATIC ADENOCARCINOMA, MOST CONSISTENT WITH PRIMARY OVARIAN ENDOMETRIOID ADENOCARCINOMA. 4. Ovary, left - BENIGN UNREMARKABLE OVARY, NEGATIVE FOR MALIGNANCY. Microscopic Comment 1. UTERUS, CARCINOMA OR CARCINOSARCOMA Procedure: Total hysterectomy with bilateral salpingo-oophorectomy. Histologic type: Endometrioid adenocarcinoma. Histologic Grade: FIGO Grade I Myometrial invasion: Depth of invasion: 2 mm Myometrial thickness: 21 mm Uterine Serosa Involvement: Not identified Cervical stromal involvement: Not identified Extent of involvement of other organs: Not  applicable Lymphovascular invasion: Not identified Regional Lymph Nodes: Examined: 0 Sentinel 0 Non-sentinel 0 Total Tumor block for ancillary studies: 1H MMR / MSI testing: Pending Pathologic Stage Classification (pTNM, AJCC 8th edition): pT1a, pNX (v4.1.0.0) 2. OVARY or FALLOPIAN TUBE or PRIMARY PERITONEUM: Procedure: Salpingo-oophorectomy Specimen Integrity: Intact Tumor Site: Right ovary Ovarian Surface Involvement (required only if applicable): Focally involved by carcinoma Fallopian Tube Surface Involvement (required only if applicable): Not identified Tumor Size: 12 cm Histologic Type: Endometrioid adenocarcinoma Histologic Grade: Grade II Implants (required for advanced stage serous/seromucinous borderline tumors only): Not applicable Other Tissue/ Organ Involvement: Cul de sac biopsy involved by tumor Largest Extrapelvic Peritoneal Focus (required only if applicable): Not applicable Peritoneal/Ascitic Fluid: Negative for carcinoma (case # [id number]) Treatment Effect (required only for high-grade serous carcinomas): Not applicable Regional Lymph Nodes: No lymph nodes submitted or found Number of Lymph Nodes Examined: 0 Pathologic Stage Classification (pTNM, AJCC 8th Edition): pT2b, pN0 Representative Tumor Block: 2B and 2E 1. Molecular study for microsatellite instability and immunohistochemical stains for MMR-related proteins are pending and will be reported in an addendum. 2. Immunohistochemical stain show that the ovarian tumor is positive for CK7 and PAX8 (both diffuse), CDX2 (patchy and weak); and negative for CK20. This immunoprofile is consistent with the above diagnosis. Dr. Lyndon Code has reviewed this case and concurs with the above diagnosis. Molecular study for microsatellite instability and immunohistochemical stains for MMR-related proteins are pending and will be reported in an addendum   08/30/2017 Genetic Testing   Patient has genetic testing done for MSI  Results  revealed patient has the following mutation(s): loss of [facility] 2   08/30/2017 Surgery   Surgeon: Mart Piggs, MD Pre-operative Diagnosis:  1. Adnexal mass 2. Abnormal uterine bleeding 3. H/o Cecal CA  Post-operative Diagnosis:  1. Adhesive disease post colon resection 2. Endometrial cancer NOS 3. Adenocarcinoma unknown origin, right ovary, suspicious for GI primary  Operation:  1. Lysis of adhesions ~30 minutes 2. Robotic-assisted laparoscopic total hysterectomy with right salpingo-oophorectomy and left salpingectomy 3. Left oophorectomy (RA-laparoscopic) 4. Pelvic washings  Findings: Adhesions of omentum to anterior abdominal wall. Enlarged cystic right ovary ~10cm. Uterus had small nodules on serosa near where right adnexa was intimate with the surface. No obvious intraoperative rupture of cyst, although in 2 areas the wall was thin and one of these areas had some bleeding. Slight scarring of left bladder dome to LUS/cervix. Uterus on frozen section c/w hyperplasia and a small focus of endometrial CA - no myo invasion, <2cm in size. Frozen section on the right adnexa was carcinoma, met from colon or possibly Gyn, favor GI primary, defer to permanent. Left ovary was WNL.     09/15/2017 Cancer Staging   Staging form: Corpus Uteri - Carcinoma and Carcinosarcoma, AJCC 8th Edition - Pathologic: FIGO Stage IA (pT1a, pN0, cM0) - Signed by Heath Lark, MD on 09/15/2017   09/26/2017 Procedure   Successful placement of a right internal jugular approach power injectable Port-A-Cath. The catheter is ready for immediate use.   11/07/2017 Imaging   1. Since 08/18/2017, similar to slight decrease in size of a pancreatic body/tail junction lesion. Cross modality comparison relative to 09/16/2017 MRI is also grossly similar. 2. No evidence of metastatic disease. 3. Aortic Atherosclerosis (ICD10-I70.0).  4. Left nephrolithiasis.   11/18/2017 Genetic Testing   MSH2 c.2237dupT pathogenic  mutation identified in the CancerNext panel.  The CancerNext gene panel offered by [employer] includes sequencing and rearrangement analysis for the following 34 genes:  APC, ATM, BARD1, BMPR1A, BRCA1, BRCA2, BRIP1, CDH1, CDK4, CDKN2A, CHEK2, DICER1, HOXB13, EPCAM, GREM1, MLH1, MRE11A, MSH2, MSH6, MUTYH, NBN, NF1, PALB2, PMS2, POLD1, POLE, PTEN, RAD50, RAD51C, RAD51D, SMAD4, SMARCA4, STK11, and TP53.  The report date is November 18, 2017.  MSH2 [id number] pathogenic mutation identified on somatic testing.  These results are consistent with a diagnosis of Lynch syndrome.   06/18/2019 - 08/14/2019 Chemotherapy   The patient had FOLFIRINOX for chemotherapy treatment.     01/19/2020 -  Chemotherapy    Patient is on Treatment Plan: PANCREATIC ABRAXANE / GEMCITABINE D1,8,15 Q28D      04/11/2020 Imaging   1. Essentially stable appearance of the soft tissue density process along the distal pancreatectomy site, abutting the posterior portion of the stomach, celiac trunk, left lateral margin of the SMA, and potentially the superior margin of the left renal vein. This could be inflammatory but residual tumor not excluded. 2. Some of the liver lesions are mildly reduced in size and some of the liver lesions are stable in size. 3. Further reduction in omental nodularity. 4. Small paraumbilical hernia contains a loop of small bowel, without findings of strangulation or obstruction. 5. Small supraumbilical hernias containing adipose tissue. 6. Small nonobstructive left renal calculus.   Secondary malignant neoplasm of right ovary ([place])  08/23/2017 Tumor Marker   Patient's tumor was tested for the following markers: CA-125 Results of the tumor marker test revealed 139.8   08/31/2017 Initial Diagnosis   Secondary malignant neoplasm of right ovary ([place])   09/15/2017 Cancer Staging   Staging form: Ovary, Fallopian Tube, and Primary Peritoneal Carcinoma, AJCC 8th Edition - Pathologic: Stage IIB  (pT2b, pN0, cM0) - Signed by Heath Lark, MD on 09/15/2017   09/27/2017 Imaging   No evidence of metastatic disease or other acute findings within the thorax.  4 cm low-attenuation mass in pancreatic tail, highly suspicious for pancreatic carcinoma. This is caused splenic vein thrombosis, with new venous collaterals in the left upper quadrant. Consider endoscopic ultrasound with FNA for tissue diagnosis.  Stable benign hepatic hemangioma.    09/27/2017 Tumor Marker   Patient's tumor was tested for the following markers: CA-125 Results of the tumor marker test revealed 39.4   11/02/2017 Tumor Marker   Patient's tumor was tested for the following markers: CA-125 Results of the tumor marker test revealed 19   11/07/2017 Tumor Marker   Patient's tumor was tested for the following markers: CA-125 Results of the tumor marker test revealed 18.3   11/18/2017 Genetic Testing   MSH2 c.2237dupT pathogenic mutation identified in the CancerNext panel.  The CancerNext gene panel offered by [employer] includes sequencing and rearrangement analysis for the following 34 genes:   APC, ATM, BARD1, BMPR1A, BRCA1, BRCA2, BRIP1, CDH1, CDK4, CDKN2A, CHEK2, DICER1, HOXB13, EPCAM, GREM1, MLH1, MRE11A, MSH2, MSH6, MUTYH, NBN, NF1, PALB2, PMS2, POLD1, POLE, PTEN, RAD50, RAD51C, RAD51D, SMAD4, SMARCA4, STK11, and TP53.  The report date is November 18, 2017.  MSH2 [id number] pathogenic mutation identified on somatic testing.  These results are consistent with a diagnosis of Lynch syndrome.   12/15/2017 Tumor Marker   Patient's tumor was tested for the following markers: CA-125 Results of the tumor marker test revealed 57.3   01/16/2018 Tumor Marker   Patient's tumor was tested for the following markers: CA-125 Results of the tumor marker test revealed 24.2   04/10/2018 Tumor Marker   Patient's tumor was tested for the following markers: CA-125 Results of the tumor marker test revealed 18.2  07/12/2018 Tumor Marker   Patient's tumor was tested for the following markers: CA-125 Results of the tumor marker test revealed 11.8   10/16/2018 Tumor Marker   Patient's tumor was tested for the following markers: CA125 Results of the tumor marker test revealed 12.4.   01/18/2020 Tumor Marker   Patient's tumor was tested for the following markers: CA-125 Results of the tumor marker test revealed 18   Malignant neoplasm of tail of pancreas ([place])  10/13/2017 Pathology Results   Pancreas tail mass, endoscopic ultrasound-guided, fine needle aspiration II (smears and cell block): Adenocarcinoma   11/18/2017 Genetic Testing   MSH2 c.2237dupT pathogenic mutation identified in the CancerNext panel.  The CancerNext gene panel offered by [employer] includes sequencing and rearrangement analysis for the following 34 genes:   APC, ATM, BARD1, BMPR1A, BRCA1, BRCA2, BRIP1, CDH1, CDK4, CDKN2A, CHEK2, DICER1, HOXB13, EPCAM, GREM1, MLH1, MRE11A, MSH2, MSH6, MUTYH, NBN, NF1, PALB2, PMS2, POLD1, POLE, PTEN, RAD50, RAD51C, RAD51D, SMAD4, SMARCA4, STK11, and TP53.  The report date is November 18, 2017.  MSH2 [id number] pathogenic mutation identified on somatic testing.  These results are consistent with a diagnosis of Lynch syndrome.   11/24/2017 Pathology Results   A. "TAIL OF PANCREAS AND SPLEEN", DISTAL PANCREATECTOMY AND SPLENECTOMY: Invasive ductal adenocarcinoma, moderately to poorly differentiated with focal signet ring cell features, of pancreas (distal).   The carcinoma is 2.5 cm in greatest dimension grossly.   Treatment effects present in the form of fibrosis (50%). No lymphovascular or definite perineural invasion identified. All surgical margins are negative for tumor or high-grade dysplasia. Adjacent mucinous neoplasm, most consistent with Intraductal papillary mucinous neoplasm (IPMN) with low-grade dysplasia.   Uninvolved pancreas  show atrophy and focal acute inflammation.   Twelve benign lymph nodes (0/12). Spleen with no significant histopathologic abnormalities.  PROCEDURE: distal pancreatectomy and splenectomy TUMOR SITE: distal pancreas TUMOR SIZE:  GREATEST DIMENSION: 2.5 cm  ADDITIONAL DIMENSIONS:  x  cm HISTOLOGIC TYPE: ductal adenocarcinoma HISTOLOGIC GRADE: grade 3 TUMOR EXTENSION: peripancreatic soft tissue MARGINS: negative for tumor TREATMENT EFFECT: treatment effects present in the form of fibrosis (50%). LYMPHOVASCULAR INVASION: not identified PERINEURAL INVASION: no definite evidence REGIONAL LYMPH NODES:   NUMBER OF LYMPH NODES INVOLVED: 0   NUMBER OF LYMPH NODES EXAMINED: 12 PATHOLOGIC STAGE CLASSIFICATION (pTNM, AJCC 8th Ed): ypT2, ypN0 DISTANT METASTASIS (pM): pMx ADDITIONAL PATHOLOGIC FINDINGS: mucinous neoplasm, most consistent with intraductal papillary mucinous neoplasm (IPMN) with low-grade dysplasia, is identified adjacent to the main tumor.   11/24/2017 Cancer Staging   Staging form: Exocrine Pancreas, AJCC 8th Edition - Pathologic stage from 11/24/2017: Stage IB (pT2, pN0, cM0) - Signed by Truitt Merle, MD on 01/03/2018   11/25/2017 Surgery   She had surgery at [facility] 1. Exploratory Laparotomy 2. Distal Pancreatectomy and Splenectomy 3. Intraoperative Ultrasound 4. Open Cholecystectomy    12/15/2017 Cancer Staging   Staging form: Exocrine Pancreas, AJCC 8th Edition - Clinical: Stage IB (cT2, cN0, cM0) - Signed by Heath Lark, MD on 12/15/2017   12/28/2017 Imaging   12/28/2017 CT Abdomen  IMPRESSION: 1. Postoperative findings from recent partial pancreatectomy including a 21 cubic cm fluid collection along the pancreatic resection margin which could represent early pseudocyst. 2. Nodularity along the lateral limb of the left adrenal gland could also be postoperative but merit surveillance, as the pancreatic lesion was in close proximity to this adrenal  gland on the prior CT of 11/07/2017. 3. Asymmetric fullness inferiorly in the left breast. The patient has a history of prior  left breast procedures, correlation with mammography is recommended. 4. Other imaging findings of potential clinical significance: Stable hemangioma in the left hepatic lobe. Splenectomy. Aortic Atherosclerosis (ICD10-I70.0). Right hemicolectomy. Small focus of fat necrosis in the right anterior abdominal wall subcutaneous tissues near the laparotomy site. Bilateral nonobstructive nephrolithiasis.   01/02/2018 Tumor Marker   Patient's tumor was tested for the following markers: CA-19-9 Results of the tumor marker test revealed 5   01/03/2018 - 06/05/2018 Chemotherapy   She received modified dose FOLFIRINOX   04/10/2018 Imaging   1. Distal pancreatectomy, without findings of recurrent or metastatic disease. 2. Incompletely imaged hypoenhancement within lower lobe right pulmonary artery branch is likely chronic (but interval since 10/09/2017) pulmonary embolism. Dedicated CTA could further evaluate. 3. Decrease in size of a peripancreatic complex fluid collection anteriorly, likely a resolving pseudocyst. 4. Decreased size of minimal fluid versus a borderline sized node in the gastrohepatic ligament. Recommend attention on follow-up. 5. Hepatic steatosis with a segment 4 hemangioma. 6. Aortic Atherosclerosis (ICD10-I70.0). This is significantly age advanced.   07/12/2018 Tumor Marker   Patient's tumor was tested for the following markers: CA-19-9 Results of the tumor marker test revealed 6   07/12/2018 Imaging   1. Status post distal pancreatectomy with stable postoperative fluid collection adjacent to the ventral aspect of the pancreatic head. No findings to suggest metastatic disease in the abdomen or pelvis. 2. Hepatic steatosis with small cavernous hemangioma in segment 4A of the liver. 3. Slight decreased size of nonenlarged gastrohepatic ligament lymph node,  presumably benign. 4. Aortic atherosclerosis.    10/16/2018 Imaging   Status post distal pancreatectomy. Stable postoperative seroma along the surgical margin.   Status post hysterectomy and right oophorectomy.   Status post right hemicolectomy with appendectomy.   No evidence of recurrent or metastatic disease.   No colonic wall thickening or mass is evident on CT.   01/22/2019 Tumor Marker   Patient's tumor was tested for the following markers: CA-19.9 Results of the tumor marker test revealed 5   01/22/2019 Imaging   1. No evidence of local recurrence or metastatic disease status post distal pancreatectomy and splenectomy. 2. Stable small seroma anterior to the pancreatic head. 3. Postsurgical changes as described. 4. Stable additional incidental findings including a hepatic hemangioma, nonobstructing bilateral renal calculi and aortic Atherosclerosis (ICD10-I70.0).   04/18/2019 Imaging   1. Status post distal pancreatectomy and splenectomy. There has been a gradual increase in ill-defined soft tissue and celiac axis/SMA origin lymph nodes adjacent to the distal pancreatectomy margin and lesser curvature of the stomach/gastric body over sequential prior examinations dated 01/22/2019 and 09/26/2018. An enlarged lymph node or soft tissue nodule adjacent to the SMA origin now measures 1.8 x 0.8 cm. Findings are concerning for local recurrence of pancreatic malignancy. 2. Separately from the above findings, there remains a 1.0 cm low-attenuation nodule anterior to the remnant pancreatic neck, most consistent with postoperative seroma 3. No evidence of distant metastatic disease in the chest, abdomen, or pelvis. 4. Postoperative findings of cholecystectomy, right hemicolectomy, and hysterectomy. 5. Bilateral nonobstructive nephrolithiasis. 6.  Aortic Atherosclerosis (ICD10-I70.0).       05/28/2019 Tumor Marker   Patient's tumor was tested for the following markers: CA19-9 Results of  the tumor marker test revealed 18   05/28/2019 Imaging   1. Status post distal pancreatectomy with continued further progression of the ill-defined soft tissue to the left of the celiac axis/SMA origin, now measuring 1.9 x 1.9 cm and highly concerning for recurrent/metastatic disease.  This process abuts both the celiac axis, SMA, and posterior wall of the mid stomach. PET-CT may prove helpful to further evaluate. 2. Bandlike ill-defined soft tissue in the anterior abdomen, potentially peritoneal or omental is similar to perhaps minimally increased qualitatively in the interval. Close attention on follow-up recommended. 3. Left paraumbilical hernia contains a short segment of small bowel without complicating features. 4. Ill-defined very subtle area of decreased attenuation in the posterior aspect of hepatic segment IV. This is likely focal fatty deposition. Close attention on follow-up recommended. 5.  Aortic Atherosclerois (ICD10-170.0)   06/07/2019 Pathology Results   Malignant cells consistent with adenocarcinoma   06/07/2019 Procedure   ENDOSCOPIC FINDING (limited views with linear echoendoscope): :      The examined esophagus was endoscopically normal.      The entire examined stomach was endoscopically normal.      ENDOSONOGRAPHIC FINDING: :      1. An irregular mass was identified in the region of the pancreatic tail resection site. The mass was stellate with very poorly defined borders. Fine needle aspiration for cytology was performed. Color Doppler imaging was utilized prior to needle puncture to confirm a lack of significant vascular structures within the needle path. Four passes were made with the 25 gauge (FNB) needle using a transgastric approach. A cytotechnologist was present to evaluate the adequacy of the specimen. Final cytology results are pending. Impression:               -  Irregular, stellate soft tissue mass in the region of the pancreatic tail resection site. This was sampled  with four transgastric passes with an EUS FNB needle..   06/18/2019 - 08/14/2019 Chemotherapy   The patient had FOLFIRINOX for chemotherapy treatment.     09/03/2019 Imaging   1. Findings are again highly concerning for locally recurrent disease adjacent to the pancreatectomy bed, with enlarging soft tissue mass which is intimately associated with the superior mesenteric artery, celiac axis and superior aspect of the left renal  vein, as detailed above. 2. No evidence of metastatic disease in the thorax. 3. 2.7 x 1.2 cm cavernous hemangioma in segment 4A of the liver again noted. 4. Small nonobstructive calculi in the collecting systems of both kidneys measuring up to 3 mm in the lower pole collecting system of the left kidney. 5. Aortic atherosclerosis. 6. Additional incidental findings, as above.   11/16/2019 Imaging   Similar-appearing ill-defined soft tissue the left of the celiac axis/SMA, most compatible with recurrent/metastatic disease.   Similar ill-defined nodularity within the anterior upper abdomen, potentially peritoneal or omental. Continued attention on follow-up is recommended.   12/31/2019 Tumor Marker   Patient's tumor was tested for the following markers: CA-19-9 Results of the tumor marker test revealed 298   01/10/2020 Imaging   1. Numerous small hypoenhancing liver masses scattered throughout the liver, all new since 11/16/2019 CT, compatible with new liver metastases. 2. Stable upper retroperitoneal mass encasing the celiac axis and abutting the pancreatic resection margin, compatible with stable locally recurrent tumor. 3. Stable subcentimeter indistinct anterior upper peritoneal implants. 4. No evidence of metastatic disease in the chest. 5. Chronic findings include: Nonobstructing left nephrolithiasis. Stable small left paraumbilical hernia containing a small bowel loop without acute bowel complication. Aortic Atherosclerosis (ICD10-I70.0).     01/19/2020 -   Chemotherapy    Patient is on Treatment Plan: PANCREATIC ABRAXANE / GEMCITABINE D1,8,15 Q28D      01/25/2020 Tumor Marker   Patient's tumor was tested for  the following markers: CA-19-9 Results of the tumor marker test revealed 1104.   03/04/2020 Tumor Marker   Patient's tumor was tested for the following markers: CA-19-9 Results of the tumor marker test revealed 581   04/11/2020 Imaging   1. Essentially stable appearance of the soft tissue density process along the distal pancreatectomy site, abutting the posterior portion of the stomach, celiac trunk, left lateral margin of the SMA, and potentially the superior margin of the left renal vein. This could be inflammatory but residual tumor not excluded. 2. Some of the liver lesions are mildly reduced in size and some of the liver lesions are stable in size. 3. Further reduction in omental nodularity. 4. Small paraumbilical hernia contains a loop of small bowel, without findings of strangulation or obstruction. 5. Small supraumbilical hernias containing adipose tissue. 6. Small nonobstructive left renal calculus.   04/11/2020 Tumor Marker   Patient's tumor was tested for the following markers: [id number] Results of the tumor marker test revealed 451     REVIEW OF SYSTEMS:   Constitutional: Denies fevers, chills or abnormal weight loss Eyes: Denies blurriness of vision Ears, nose, mouth, throat, and face: Denies mucositis or sore throat Respiratory: Denies cough, dyspnea or wheezes Cardiovascular: Denies palpitation, chest discomfort Gastrointestinal:  Denies nausea, heartburn or change in bowel habits Skin: Denies abnormal skin rashes Lymphatics: Denies new lymphadenopathy or easy bruising Neurological:Denies numbness, tingling or new weaknesses Behavioral/Psych: Mood is stable, no new changes  Extremities: No lower extremity edema All other systems were reviewed with the patient and are negative.  I have reviewed the past medical  history, past surgical history, social history and family history with the patient and they are unchanged from previous note.  ALLERGIES:  is allergic to codeine, penicillins, and bactrim [sulfamethoxazole-trimethoprim].  MEDICATIONS:  Current Outpatient Medications  Medication Sig Dispense Refill  . cetirizine (ZYRTEC) 10 MG tablet Take 10 mg by mouth daily.    . citalopram (CELEXA) 20 MG tablet TAKE 1 TABLET BY MOUTH EVERY DAY 90 tablet 4  . CREON 24000-76000 units CPEP Take 1 capsule by mouth See admin instructions. Take 1 capsule before each meal and 1 capsule after each meal, take 1 cap with snacks    . diclofenac Sodium (VOLTAREN) 1 % GEL APPLY 2-4 GRAMS TO AFFECTED JOINT 4 TIMES DAILY AS NEEDED. 400 g 1  . estradiol (ESTRACE VAGINAL) 0.1 MG/GM vaginal cream Place 1 Applicatorful vaginally 3 (three) times a week. 42.5 g 12  . fluticasone (FLONASE) 50 MCG/ACT nasal spray Place 1 spray into both nostrils daily.    Marland Kitchen gabapentin (NEURONTIN) 300 MG capsule Take 1 capsule (300 mg total) by mouth 3 (three) times daily. 90 capsule 11  . HYDROmorphone (DILAUDID) 4 MG tablet Take 1 tablet (4 mg total) by mouth every 4 (four) hours as needed. 60 tablet 0  . hydrOXYzine (ATARAX/VISTARIL) 10 MG tablet Take 1 tablet (10 mg total) by mouth 3 (three) times daily as needed. 30 tablet 1  . lidocaine-prilocaine (EMLA) cream Apply 1 application topically daily as needed (port). Apply to affected area once 30 g 11  . LORazepam (ATIVAN) 0.5 MG tablet Take 1 tablet (0.5 mg total) by mouth every 8 (eight) hours as needed for anxiety (nausea). 30 tablet 0  . metFORMIN (GLUCOPHAGE) 500 MG tablet Take 2 tablets (1,000 mg total) by mouth 2 (two) times daily with a meal. TAKE 1 TABLET BY MOUTH TWICE A DAY WITH A MEAL 180 tablet 11  . methocarbamol (ROBAXIN) 500  MG tablet TAKE 1 TABLET BY MOUTH THREE TIMES A DAY AS NEEDED FOR MUSCLE SPASMS 90 tablet 0  . Multiple Vitamin (MULTIVITAMIN WITH MINERALS) TABS tablet Take 1  tablet by mouth daily.    Marland Kitchen omeprazole (PRILOSEC) 40 MG capsule TAKE 1 CAPSULE BY MOUTH EVERY DAY 90 capsule 3  . ondansetron (ZOFRAN) 8 MG tablet TAKE 1 TAB BY MOUTH EVERY 8HOURS AS NEEDED FOR REFRACTORY NAUSEA/VOMITING START ON DAY 3AFTER CHEMO 8 tablet 7  . predniSONE (DELTASONE) 5 MG tablet Take 1 tablet (5 mg total) by mouth daily with breakfast. 60 tablet 2  . prochlorperazine (COMPAZINE) 10 MG tablet TAKE 1 TABLET BY MOUTH EVERY 6 HOURS AS NEEDED 90 tablet 3  . rivaroxaban (XARELTO) 20 MG TABS tablet Take 1 tablet (20 mg total) by mouth daily with supper. 30 tablet 9  . simethicone (MYLICON) 416 MG chewable tablet Chew 125 mg by mouth every 6 (six) hours as needed for flatulence.     No current facility-administered medications for this visit.    PHYSICAL EXAMINATION: ECOG PERFORMANCE STATUS: 1 - Symptomatic but completely ambulatory  LABORATORY DATA:  I have reviewed the data as listed CMP Latest Ref Rng & Units 05/07/2020 04/29/2020 04/11/2020  Glucose 70 - 99 mg/dL 264(H) 216(H) 102(H)  BUN 6 - 20 mg/dL '16 12 12  ' Creatinine 0.44 - 1.00 mg/dL 0.79 0.70 0.67  Sodium 135 - 145 mmol/L 134(L) 135 139  Potassium 3.5 - 5.1 mmol/L 4.2 4.2 4.3  Chloride 98 - 111 mmol/L 102 102 104  CO2 22 - 32 mmol/L '26 27 26  ' Calcium 8.9 - 10.3 mg/dL 9.3 8.6(L) 8.6(L)  Total Protein 6.5 - 8.1 g/dL 7.3 6.7 6.5  Total Bilirubin 0.3 - 1.2 mg/dL 0.6 0.3 0.3  Alkaline Phos 38 - 126 U/L 232(H) 206(H) 89  AST 15 - 41 U/L 72(H) 93(H) 27  ALT 0 - 44 U/L 239(H) 232(H) 50(H)    Lab Results  Component Value Date   WBC 15.8 (H) 05/07/2020   HGB 11.8 (L) 05/07/2020   HCT 37.0 05/07/2020   MCV 93.4 05/07/2020   PLT 701 (H) 05/07/2020   NEUTROABS 12.8 (H) 05/07/2020     RADIOGRAPHIC STUDIES: I have personally reviewed the radiological images as listed and agreed with the findings in the report. CT ABDOMEN PELVIS W CONTRAST  Result Date: 04/11/2020 CLINICAL DATA:  Pancreatic cancer, chemotherapy in  progress. History of ovarian/uterine cancer remotely. Prior distal pancreatectomy and splenectomy. Prior colon resection. EXAM: CT ABDOMEN AND PELVIS WITH CONTRAST TECHNIQUE: Multidetector CT imaging of the abdomen and pelvis was performed using the standard protocol following bolus administration of intravenous contrast. CONTRAST:  158m OMNIPAQUE IOHEXOL 300 MG/ML  SOLN COMPARISON:  01/10/2020 FINDINGS: Lower chest: A central venous catheter terminates in the right atrium. Hepatobiliary: Scattered small hypodense liver lesions many of which demonstrate transient hepatic attenuation difference. In general most of the liver lesions demonstrate some mild reduction in size and conspicuity. For example, a medial right hepatic lobe lesion measuring 1.2 by 0.9 cm on image 49 of series 7 previously measured 1.5 by 1.2 cm on the prior exam, and was more sharply defined. Some of the small lesions are stable and many of the lesions are below 1 cm in diameter. No new lesions identified. Cholecystectomy noted. Pancreas: Distal pancreatectomy with indistinct soft tissue density along the distal pancreatectomy site, some of which could be inflammatory but residual tumor not excluded. This region of density measures about 4.0 by 2.1 cm  on image 53 of series 7, and when I measure in the same fashion on the prior exam I arrive at a measurement of 4.0 by 2.2 cm, essentially stable. This region abuts the posterior margin of the stomach. Spleen: Splenectomy Adrenals/Urinary Tract: 3 mm left kidney lower pole nonobstructive renal calculus. Otherwise unremarkable. Stomach/Bowel: The soft tissue density process along the distal pancreatectomy site abuts the posterior portion of the stomach as on the prior exam. A small paraumbilical hernia on image 96 of series 7 contains a loop of small bowel, without findings of strangulation or obstruction. Vascular/Lymphatic: The soft tissue density along the distal pancreatectomy site abuts the  celiac trunk. This also abuts the left lateral margin of the SMA and potentially the superior margin of the left renal vein. Overall these relationships are not appreciably changed. Reproductive: Uterus absent. Adnexa unremarkable. Other: Reduced size and conspicuity of omental tumor implants. For example, a small omental nodule measuring 4 mm in short axis on image 46 of series 7 previously measured 6 mm in short axis. Other areas of indistinct nodularity have substantially improved and appear indistinct. Musculoskeletal: Small supraumbilical hernias containing adipose tissue. A periumbilical hernia contains a loop of small bowel as noted above. IMPRESSION: 1. Essentially stable appearance of the soft tissue density process along the distal pancreatectomy site, abutting the posterior portion of the stomach, celiac trunk, left lateral margin of the SMA, and potentially the superior margin of the left renal vein. This could be inflammatory but residual tumor not excluded. 2. Some of the liver lesions are mildly reduced in size and some of the liver lesions are stable in size. 3. Further reduction in omental nodularity. 4. Small paraumbilical hernia contains a loop of small bowel, without findings of strangulation or obstruction. 5. Small supraumbilical hernias containing adipose tissue. 6. Small nonobstructive left renal calculus. Electronically Signed   By: Van Clines M.D.   On: 04/11/2020 12:53    I discussed the assessment and treatment plan with the patient. The patient was provided an opportunity to ask questions and all were answered. The patient agreed with the plan and demonstrated an understanding of the instructions. The patient was advised to call back or seek an in-person evaluation if the symptoms worsen or if the condition fails to improve as anticipated.    I spent 20 minutes for the appointment reviewing test results, discuss management and coordination of care.  Heath Lark,  MD 05/07/2020 3:19 PM

## 2020-05-07 NOTE — Telephone Encounter (Signed)
Patient in agreement.  Labs scheduled, and follow up virtual visit confirmed.

## 2020-05-09 ENCOUNTER — Telehealth: Payer: Self-pay

## 2020-05-09 NOTE — Telephone Encounter (Signed)
Metformin is usually taken just before meal, Breakfast and dinner If she is interested I can add oral hypoglycemic agent, another pill daily in the morning, let me know or she can wait till next week

## 2020-05-09 NOTE — Telephone Encounter (Signed)
She called and left a message to call her.  Called back. Glucose fasting this morning was 189. Nothing to eat since 9 pm last night. She is taking metformin 1 tab am and 2 tabs at bedtime she said. She is drinking lots of water. She skipped Prednisone yesterday due to concerns over glucose. Glucose at 7:40 pm 158. Itching is a little better.  She is asking if you have any recommendations or just keep doing what she is doing? Should she be taking Metformin at dinner time with meal?

## 2020-05-09 NOTE — Telephone Encounter (Signed)
Called and given below message, She verbalized understanding. She will take metformin BID with meals. She will wait until appt next week to see if another medication needs to be added.

## 2020-05-13 ENCOUNTER — Encounter: Payer: Self-pay | Admitting: Hematology and Oncology

## 2020-05-13 ENCOUNTER — Inpatient Hospital Stay: Payer: BC Managed Care – PPO

## 2020-05-13 ENCOUNTER — Other Ambulatory Visit: Payer: Self-pay

## 2020-05-13 ENCOUNTER — Telehealth: Payer: Self-pay | Admitting: Hematology and Oncology

## 2020-05-13 ENCOUNTER — Other Ambulatory Visit: Payer: Self-pay | Admitting: Hematology and Oncology

## 2020-05-13 ENCOUNTER — Inpatient Hospital Stay (HOSPITAL_BASED_OUTPATIENT_CLINIC_OR_DEPARTMENT_OTHER): Payer: BC Managed Care – PPO | Admitting: Hematology and Oncology

## 2020-05-13 DIAGNOSIS — G893 Neoplasm related pain (acute) (chronic): Secondary | ICD-10-CM

## 2020-05-13 DIAGNOSIS — C252 Malignant neoplasm of tail of pancreas: Secondary | ICD-10-CM

## 2020-05-13 DIAGNOSIS — C541 Malignant neoplasm of endometrium: Secondary | ICD-10-CM

## 2020-05-13 DIAGNOSIS — R748 Abnormal levels of other serum enzymes: Secondary | ICD-10-CM

## 2020-05-13 DIAGNOSIS — E099 Drug or chemical induced diabetes mellitus without complications: Secondary | ICD-10-CM | POA: Diagnosis not present

## 2020-05-13 DIAGNOSIS — Z5111 Encounter for antineoplastic chemotherapy: Secondary | ICD-10-CM | POA: Diagnosis not present

## 2020-05-13 DIAGNOSIS — Z7189 Other specified counseling: Secondary | ICD-10-CM

## 2020-05-13 DIAGNOSIS — M0609 Rheumatoid arthritis without rheumatoid factor, multiple sites: Secondary | ICD-10-CM

## 2020-05-13 DIAGNOSIS — T380X5D Adverse effect of glucocorticoids and synthetic analogues, subsequent encounter: Secondary | ICD-10-CM

## 2020-05-13 DIAGNOSIS — C7961 Secondary malignant neoplasm of right ovary: Secondary | ICD-10-CM

## 2020-05-13 LAB — CBC WITH DIFFERENTIAL (CANCER CENTER ONLY)
Abs Immature Granulocytes: 0.06 10*3/uL (ref 0.00–0.07)
Basophils Absolute: 0.1 10*3/uL (ref 0.0–0.1)
Basophils Relative: 1 %
Eosinophils Absolute: 0.3 10*3/uL (ref 0.0–0.5)
Eosinophils Relative: 3 %
HCT: 34 % — ABNORMAL LOW (ref 36.0–46.0)
Hemoglobin: 10.7 g/dL — ABNORMAL LOW (ref 12.0–15.0)
Immature Granulocytes: 1 %
Lymphocytes Relative: 24 %
Lymphs Abs: 2.2 10*3/uL (ref 0.7–4.0)
MCH: 30 pg (ref 26.0–34.0)
MCHC: 31.5 g/dL (ref 30.0–36.0)
MCV: 95.2 fL (ref 80.0–100.0)
Monocytes Absolute: 1.7 10*3/uL — ABNORMAL HIGH (ref 0.1–1.0)
Monocytes Relative: 18 %
Neutro Abs: 4.7 10*3/uL (ref 1.7–7.7)
Neutrophils Relative %: 53 %
Platelet Count: 392 10*3/uL (ref 150–400)
RBC: 3.57 MIL/uL — ABNORMAL LOW (ref 3.87–5.11)
RDW: 17.9 % — ABNORMAL HIGH (ref 11.5–15.5)
WBC Count: 9 10*3/uL (ref 4.0–10.5)
nRBC: 0 % (ref 0.0–0.2)

## 2020-05-13 LAB — CMP (CANCER CENTER ONLY)
ALT: 151 U/L — ABNORMAL HIGH (ref 0–44)
AST: 67 U/L — ABNORMAL HIGH (ref 15–41)
Albumin: 2.9 g/dL — ABNORMAL LOW (ref 3.5–5.0)
Alkaline Phosphatase: 212 U/L — ABNORMAL HIGH (ref 38–126)
Anion gap: 10 (ref 5–15)
BUN: 8 mg/dL (ref 6–20)
CO2: 27 mmol/L (ref 22–32)
Calcium: 8.7 mg/dL — ABNORMAL LOW (ref 8.9–10.3)
Chloride: 101 mmol/L (ref 98–111)
Creatinine: 0.73 mg/dL (ref 0.44–1.00)
GFR, Estimated: >60 mL/min (ref >60)
Glucose, Bld: 176 mg/dL — ABNORMAL HIGH (ref 70–99)
Potassium: 4.4 mmol/L (ref 3.5–5.1)
Sodium: 138 mmol/L (ref 135–145)
Total Bilirubin: 0.5 mg/dL (ref 0.3–1.2)
Total Protein: 6.3 g/dL — ABNORMAL LOW (ref 6.5–8.1)

## 2020-05-13 MED ORDER — SODIUM CHLORIDE 0.9 % IV SOLN
600.0000 mg/m2 | Freq: Once | INTRAVENOUS | Status: AC
  Administered 2020-05-13: 1254 mg via INTRAVENOUS
  Filled 2020-05-13: qty 32.98

## 2020-05-13 MED ORDER — ONDANSETRON HCL 4 MG/2ML IJ SOLN
INTRAMUSCULAR | Status: AC
  Filled 2020-05-13: qty 4

## 2020-05-13 MED ORDER — SODIUM CHLORIDE 0.9 % IV SOLN
Freq: Once | INTRAVENOUS | Status: AC
  Filled 2020-05-13: qty 250

## 2020-05-13 MED ORDER — ONDANSETRON HCL 4 MG/2ML IJ SOLN
8.0000 mg | Freq: Once | INTRAMUSCULAR | Status: AC
  Administered 2020-05-13: 8 mg via INTRAVENOUS

## 2020-05-13 MED ORDER — SODIUM CHLORIDE 0.9% FLUSH
10.0000 mL | Freq: Once | INTRAVENOUS | Status: AC
  Administered 2020-05-13: 10 mL
  Filled 2020-05-13: qty 10

## 2020-05-13 MED ORDER — PACLITAXEL PROTEIN-BOUND CHEMO INJECTION 100 MG
100.0000 mg/m2 | Freq: Once | INTRAVENOUS | Status: AC
  Administered 2020-05-13: 200 mg via INTRAVENOUS
  Filled 2020-05-13: qty 40

## 2020-05-13 MED ORDER — SODIUM CHLORIDE 0.9% FLUSH
10.0000 mL | INTRAVENOUS | Status: DC | PRN
  Administered 2020-05-13: 10 mL
  Filled 2020-05-13: qty 10

## 2020-05-13 MED ORDER — HEPARIN SOD (PORK) LOCK FLUSH 100 UNIT/ML IV SOLN
500.0000 [IU] | Freq: Once | INTRAVENOUS | Status: AC | PRN
  Administered 2020-05-13: 500 [IU]
  Filled 2020-05-13: qty 5

## 2020-05-13 NOTE — Assessment & Plan Note (Signed)
She has intermittent flare of rheumatoid arthritis causing pain We discussed the importance of judicious use of prednisone as it can exacerbate her diabetes For now, she will continue to take pain medicine as prescribed

## 2020-05-13 NOTE — Telephone Encounter (Signed)
Scheduled appts per 3/22 los. Pt aware.

## 2020-05-13 NOTE — Assessment & Plan Note (Signed)
Her liver enzymes are trending down We have extensive discussions about medications to avoid She is in agreement to hold off further alcohol intake We will reduce the dose of treatment and treat her diabetes aggressively For now, we will proceed with treatment with dose reduction as above

## 2020-05-13 NOTE — Assessment & Plan Note (Signed)
The cause of her elevated liver enzymes are multifactorial It is likely due to recent chemotherapy, uncontrolled diabetes and recent alcohol ingestion I have reviewed the dosing of her chemotherapy with pharmacist The plan would be to proceed with mildly reduced dose of Abraxane 800 mg per metered square and gemcitabine and 600 mg per metered square We will continue treatment every 2 weeks I plan to repeat imaging study again in May

## 2020-05-13 NOTE — Progress Notes (Signed)
[facility] OFFICE PROGRESS NOTE  Patient Care Team: [name], MD as PCP - General (Family Medicine)  ASSESSMENT & PLAN:  Malignant neoplasm of tail of pancreas [facility]) The cause of her elevated liver enzymes are multifactorial It is likely due to recent chemotherapy, uncontrolled diabetes and recent alcohol ingestion I have reviewed the dosing of her chemotherapy with pharmacist The plan would be to proceed with mildly reduced dose of Abraxane 800 mg per metered square and gemcitabine and 600 mg per metered square We will continue treatment every 2 weeks I plan to repeat imaging study again in May   Elevated liver enzymes Her liver enzymes are trending down We have extensive discussions about medications to avoid She is in agreement to hold off further alcohol intake We will reduce the dose of treatment and treat her diabetes aggressively For now, we will proceed with treatment with dose reduction as above  Cancer associated pain She has multifactorial cause of pain Her pain is due to her disease in her abdomen as well as recent flare of rheumatoid arthritis She will continue current dose of prescribed pain medicine  Steroid-induced diabetes ([place]) She has recent uncontrolled diabetes She will continue increased dose of Metformin and aggressive lifestyle changes and dietary modification  Rheumatoid arthritis of multiple sites with negative rheumatoid factor ([place]) She has intermittent flare of rheumatoid arthritis causing pain We discussed the importance of judicious use of prednisone as it can exacerbate her diabetes For now, she will continue to take pain medicine as prescribed   No orders of the defined types were placed in this encounter.   All questions were answered. The patient knows to call the clinic with any problems, questions or concerns. The total time spent in the appointment was 40 minutes encounter with patients including review of chart and  various tests results, discussions about plan of care and coordination of care plan   Heath Lark, MD 05/13/2020 1:36 PM  INTERVAL HISTORY: Please see below for problem oriented charting. 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 Mixed attenuation mass emanates from the right adnexa measuring 10.8 x 8.0 cm very suspicious for right ovarian carcinoma. 2. Abnormality of the tail of the pancreas may be due to mild pancreatitis and small pseudocyst formation, but neoplasm cannot be excluded. 3. Small amount of ascites within abdomen and pelvis. 4. Small nonobstructing renal calculi bilaterally.    08/30/2017 Pathology Results   1. Uterus and cervix, with left fallopian tube - ENDOMETRIOID ADENOCARCINOMA, FIGO GRADE I, ARISING IN A BACKGROUND OF DIFFUSE COMPLEX ATYPICAL HYPERPLASIA. - CARCINOMA INVADES FOR OF DEPTH OF 0.2 CM WHERE THICKNESS OF MYOMETRIAL WALL IS 2.1 CM. - ALL RESECTION MARGINS ARE NEGATIVE FOR CARCINOMA. - NEGATIVE FOR LYMPHOVASCULAR OR PERINEURAL INVASION. - CERVICAL STROMA IS NOT INVOLVED. - SEE ONCOLOGY TABLE. - SEE NOTE 2. Ovary and fallopian tube, right - PRIMARY OVARIAN ENDOMETRIOID ADENOCARCINOMA, FIGO GRADE II, 12 CM. - THE OVARIAN SURFACE IS FOCALLY INVOLVED BY CARCINOMA. - NEGATIVE  FOR LYMPHOVASCULAR INVASION. - BENIGN UNREMARKABLE FALLOPIAN TUBE, NEGATIVE FOR CARCINOMA. - SEE ONCOLOGY TABLE. - SEE NOTE 3.  Cul-de-sac biopsy - METASTATIC ADENOCARCINOMA, MOST CONSISTENT WITH PRIMARY OVARIAN ENDOMETRIOID ADENOCARCINOMA. 4. Ovary, left - BENIGN UNREMARKABLE OVARY, NEGATIVE FOR MALIGNANCY. Microscopic Comment 1. UTERUS, CARCINOMA OR CARCINOSARCOMA Procedure: Total hysterectomy with bilateral salpingo-oophorectomy. Histologic type: Endometrioid adenocarcinoma. Histologic Grade: FIGO Grade I Myometrial invasion: Depth of invasion: 2 mm Myometrial thickness: 21 mm Uterine Serosa Involvement: Not identified Cervical stromal involvement: Not identified Extent of involvement of other organs: Not applicable Lymphovascular invasion: Not identified Regional Lymph Nodes: Examined: 0 Sentinel 0 Non-sentinel 0 Total Tumor block for ancillary studies: 1H MMR / MSI testing: Pending Pathologic Stage Classification (pTNM, AJCC 8th edition): pT1a, pNX (v4.1.0.0) 2. OVARY or FALLOPIAN TUBE or PRIMARY PERITONEUM: Procedure: Salpingo-oophorectomy Specimen Integrity: Intact Tumor Site: Right ovary Ovarian Surface Involvement (required only if applicable): Focally involved by carcinoma Fallopian Tube Surface Involvement (required only if applicable): Not identified Tumor Size: 12 cm Histologic Type: Endometrioid adenocarcinoma Histologic Grade: Grade II Implants (required for advanced stage serous/seromucinous borderline tumors only): Not applicable Other Tissue/ Organ Involvement: Cul de sac biopsy involved by tumor Largest Extrapelvic Peritoneal Focus (required only if applicable): Not applicable Peritoneal/Ascitic Fluid: Negative for carcinoma (case # [id number]) Treatment Effect (required only for high-grade serous carcinomas): Not applicable Regional Lymph Nodes: No lymph nodes submitted or found Number of Lymph Nodes Examined: 0 Pathologic Stage Classification (pTNM, AJCC 8th Edition): pT2b, pN0 Representative Tumor Block: 2B and 2E 1. Molecular study for microsatellite instability and  immunohistochemical stains for MMR-related proteins are pending and will be reported in an addendum. 2. Immunohistochemical stain show that the ovarian tumor is positive for CK7 and PAX8 (both diffuse), CDX2 (patchy and weak); and negative for CK20. This immunoprofile is consistent with the above diagnosis. Dr. Lyndon Code has reviewed this case and concurs with the above diagnosis. Molecular study for microsatellite instability and immunohistochemical stains for MMR-related proteins are pending and will be reported in an addendum   08/30/2017 Genetic Testing   Patient has genetic testing done for MSI  Results revealed patient has the following mutation(s): loss of [facility] 2   08/30/2017 Surgery   Surgeon: Mart Piggs, MD Pre-operative Diagnosis:  1. Adnexal mass 2. Abnormal uterine bleeding 3. H/o Cecal CA  Post-operative Diagnosis:  1. Adhesive disease post colon resection 2. Endometrial cancer NOS 3. Adenocarcinoma unknown origin, right ovary, suspicious for GI primary  Operation:  1. Lysis of adhesions ~30 minutes 2. Robotic-assisted laparoscopic total hysterectomy with right salpingo-oophorectomy and left salpingectomy 3. Left oophorectomy (RA-laparoscopic) 4. Pelvic washings  Findings: Adhesions of omentum to anterior abdominal wall. Enlarged cystic right ovary ~10cm. Uterus had small nodules on serosa near where right adnexa was intimate with the surface. No obvious intraoperative rupture of cyst, although in 2 areas the wall was thin and one of these areas had some bleeding. Slight scarring of left bladder dome to LUS/cervix. Uterus on frozen section c/w hyperplasia and a small focus of endometrial CA - no myo invasion, <2cm in size. Frozen section on the right adnexa was carcinoma, met from colon or possibly Gyn, favor GI primary, defer to permanent. Left ovary was WNL.     09/15/2017 Cancer Staging   Staging form: Corpus Uteri - Carcinoma and Carcinosarcoma, AJCC 8th Edition -  Pathologic: FIGO Stage IA (pT1a, pN0, cM0) - Signed by Heath Lark, MD on 09/15/2017   09/26/2017 Procedure   Successful placement of a right internal jugular  approach power injectable Port-A-Cath. The catheter is ready for immediate use.   11/07/2017 Imaging   1. Since 08/18/2017, similar to slight decrease in size of a pancreatic body/tail junction lesion. Cross modality comparison relative to 09/16/2017 MRI is also grossly similar. 2. No evidence of metastatic disease. 3. Aortic Atherosclerosis (ICD10-I70.0).  4. Left nephrolithiasis.   11/18/2017 Genetic Testing   MSH2 c.2237dupT pathogenic mutation identified in the CancerNext panel.  The CancerNext gene panel offered by [employer] includes sequencing and rearrangement analysis for the following 34 genes:   APC, ATM, BARD1, BMPR1A, BRCA1, BRCA2, BRIP1, CDH1, CDK4, CDKN2A, CHEK2, DICER1, HOXB13, EPCAM, GREM1, MLH1, MRE11A, MSH2, MSH6, MUTYH, NBN, NF1, PALB2, PMS2, POLD1, POLE, PTEN, RAD50, RAD51C, RAD51D, SMAD4, SMARCA4, STK11, and TP53.  The report date is November 18, 2017.  MSH2 [id number] pathogenic mutation identified on somatic testing.  These results are consistent with a diagnosis of Lynch syndrome.   06/18/2019 - 08/14/2019 Chemotherapy   The patient had FOLFIRINOX for chemotherapy treatment.     01/19/2020 -  Chemotherapy    Patient is on Treatment Plan: PANCREATIC ABRAXANE / GEMCITABINE D1,8,15 Q28D      04/11/2020 Imaging   1. Essentially stable appearance of the soft tissue density process along the distal pancreatectomy site, abutting the posterior portion of the stomach, celiac trunk, left lateral margin of the SMA, and potentially the superior margin of the left renal vein. This could be inflammatory but residual tumor not excluded. 2. Some of the liver lesions are mildly reduced in size and some of the liver lesions are stable in size. 3. Further reduction in omental nodularity. 4. Small paraumbilical  hernia contains a loop of small bowel, without findings of strangulation or obstruction. 5. Small supraumbilical hernias containing adipose tissue. 6. Small nonobstructive left renal calculus.   Secondary malignant neoplasm of right ovary ([place])  08/23/2017 Tumor Marker   Patient's tumor was tested for the following markers: CA-125 Results of the tumor marker test revealed 139.8   08/31/2017 Initial Diagnosis   Secondary malignant neoplasm of right ovary ([place])   09/15/2017 Cancer Staging   Staging form: Ovary, Fallopian Tube, and Primary Peritoneal Carcinoma, AJCC 8th Edition - Pathologic: Stage IIB (pT2b, pN0, cM0) - Signed by Heath Lark, MD on 09/15/2017   09/27/2017 Imaging   No evidence of metastatic disease or other acute findings within the thorax.  4 cm low-attenuation mass in pancreatic tail, highly suspicious for pancreatic carcinoma. This is caused splenic vein thrombosis, with new venous collaterals in the left upper quadrant. Consider endoscopic ultrasound with FNA for tissue diagnosis.  Stable benign hepatic hemangioma.    09/27/2017 Tumor Marker   Patient's tumor was tested for the following markers: CA-125 Results of the tumor marker test revealed 39.4   11/02/2017 Tumor Marker   Patient's tumor was tested for the following markers: CA-125 Results of the tumor marker test revealed 19   11/07/2017 Tumor Marker   Patient's tumor was tested for the following markers: CA-125 Results of the tumor marker test revealed 18.3   11/18/2017 Genetic Testing   MSH2 c.2237dupT pathogenic mutation identified in the CancerNext panel.  The CancerNext gene panel offered by [employer] includes sequencing and rearrangement analysis for the following 34 genes:   APC, ATM, BARD1, BMPR1A, BRCA1, BRCA2, BRIP1, CDH1, CDK4, CDKN2A, CHEK2, DICER1, HOXB13, EPCAM, GREM1, MLH1, MRE11A, MSH2, MSH6, MUTYH, NBN, NF1, PALB2, PMS2, POLD1, POLE, PTEN, RAD50, RAD51C, RAD51D, SMAD4, SMARCA4, STK11, and  TP53.  The report date is November 18, 2017.  MSH2 [id number] pathogenic mutation identified on somatic testing.  These results are consistent with a diagnosis of Lynch syndrome.   12/15/2017 Tumor Marker   Patient's tumor was tested for the following markers: CA-125 Results of the tumor marker test revealed 57.3   01/16/2018 Tumor Marker   Patient's tumor was tested for the following markers: CA-125 Results of the tumor marker test revealed 24.2   04/10/2018 Tumor Marker   Patient's tumor was tested for the following markers: CA-125 Results of the tumor marker test revealed 18.2   07/12/2018 Tumor Marker   Patient's tumor was tested for the following markers: CA-125 Results of the tumor marker test revealed 11.8   10/16/2018 Tumor Marker   Patient's tumor was tested for the following markers: CA125 Results of the tumor marker test revealed 12.4.   01/18/2020 Tumor Marker   Patient's tumor was tested for the following markers: CA-125 Results of the tumor marker test revealed 18   Malignant neoplasm of tail of pancreas ([place])  10/13/2017 Pathology Results   Pancreas tail mass, endoscopic ultrasound-guided, fine needle aspiration II (smears and cell block): Adenocarcinoma   11/18/2017 Genetic Testing   MSH2 c.2237dupT pathogenic mutation identified in the CancerNext panel.  The CancerNext gene panel offered by [employer] includes sequencing and rearrangement analysis for the following 34 genes:   APC, ATM, BARD1, BMPR1A, BRCA1, BRCA2, BRIP1, CDH1, CDK4, CDKN2A, CHEK2, DICER1, HOXB13, EPCAM, GREM1, MLH1, MRE11A, MSH2, MSH6, MUTYH, NBN, NF1, PALB2, PMS2, POLD1, POLE, PTEN, RAD50, RAD51C, RAD51D, SMAD4, SMARCA4, STK11, and TP53.  The report date is November 18, 2017.  MSH2 [id number] pathogenic mutation identified on somatic testing.  These results are consistent with a diagnosis of Lynch syndrome.   11/24/2017 Pathology Results   A. "TAIL OF PANCREAS  AND SPLEEN", DISTAL PANCREATECTOMY AND SPLENECTOMY: Invasive ductal adenocarcinoma, moderately to poorly differentiated with focal signet ring cell features, of pancreas (distal).   The carcinoma is 2.5 cm in greatest dimension grossly.   Treatment effects present in the form of fibrosis (50%). No lymphovascular or definite perineural invasion identified. All surgical margins are negative for tumor or high-grade dysplasia. Adjacent mucinous neoplasm, most consistent with Intraductal papillary mucinous neoplasm (IPMN) with low-grade dysplasia.   Uninvolved pancreas show atrophy and focal acute inflammation.   Twelve benign lymph nodes (0/12). Spleen with no significant histopathologic abnormalities.  PROCEDURE: distal pancreatectomy and splenectomy TUMOR SITE: distal pancreas TUMOR SIZE:  GREATEST DIMENSION: 2.5 cm  ADDITIONAL DIMENSIONS:  x  cm HISTOLOGIC TYPE: ductal adenocarcinoma HISTOLOGIC GRADE: grade 3 TUMOR EXTENSION: peripancreatic soft tissue MARGINS: negative for tumor TREATMENT EFFECT: treatment effects present in the form of fibrosis (50%). LYMPHOVASCULAR INVASION: not identified PERINEURAL INVASION: no definite evidence REGIONAL LYMPH NODES:   NUMBER OF LYMPH NODES INVOLVED: 0   NUMBER OF LYMPH NODES EXAMINED: 12 PATHOLOGIC STAGE CLASSIFICATION (pTNM, AJCC 8th Ed): ypT2, ypN0 DISTANT METASTASIS (pM): pMx ADDITIONAL PATHOLOGIC FINDINGS: mucinous neoplasm, most consistent with intraductal papillary mucinous neoplasm (IPMN) with low-grade dysplasia, is identified adjacent to the main tumor.   11/24/2017 Cancer Staging   Staging form: Exocrine Pancreas, AJCC 8th Edition - Pathologic stage from 11/24/2017: Stage IB (pT2, pN0, cM0) - Signed by Truitt Merle, MD on 01/03/2018   11/25/2017 Surgery   She had surgery at [facility] 1. Exploratory Laparotomy 2. Distal Pancreatectomy and Splenectomy 3. Intraoperative  Ultrasound 4. Open Cholecystectomy    12/15/2017 Cancer Staging   Staging form: Exocrine Pancreas, AJCC 8th Edition - Clinical: Stage IB (cT2,  cN0, cM0) - Signed by Heath Lark, MD on 12/15/2017   12/28/2017 Imaging   12/28/2017 CT Abdomen  IMPRESSION: 1. Postoperative findings from recent partial pancreatectomy including a 21 cubic cm fluid collection along the pancreatic resection margin which could represent early pseudocyst. 2. Nodularity along the lateral limb of the left adrenal gland could also be postoperative but merit surveillance, as the pancreatic lesion was in close proximity to this adrenal gland on the prior CT of 11/07/2017. 3. Asymmetric fullness inferiorly in the left breast. The patient has a history of prior left breast procedures, correlation with mammography is recommended. 4. Other imaging findings of potential clinical significance: Stable hemangioma in the left hepatic lobe. Splenectomy. Aortic Atherosclerosis (ICD10-I70.0). Right hemicolectomy. Small focus of fat necrosis in the right anterior abdominal wall subcutaneous tissues near the laparotomy site. Bilateral nonobstructive nephrolithiasis.   01/02/2018 Tumor Marker   Patient's tumor was tested for the following markers: CA-19-9 Results of the tumor marker test revealed 5   01/03/2018 - 06/05/2018 Chemotherapy   She received modified dose FOLFIRINOX   04/10/2018 Imaging   1. Distal pancreatectomy, without findings of recurrent or metastatic disease. 2. Incompletely imaged hypoenhancement within lower lobe right pulmonary artery branch is likely chronic (but interval since 10/09/2017) pulmonary embolism. Dedicated CTA could further evaluate. 3. Decrease in size of a peripancreatic complex fluid collection anteriorly, likely a resolving pseudocyst. 4. Decreased size of minimal fluid versus a borderline sized node in the gastrohepatic ligament. Recommend attention on follow-up. 5. Hepatic steatosis with a segment  4 hemangioma. 6. Aortic Atherosclerosis (ICD10-I70.0). This is significantly age advanced.   07/12/2018 Tumor Marker   Patient's tumor was tested for the following markers: CA-19-9 Results of the tumor marker test revealed 6   07/12/2018 Imaging   1. Status post distal pancreatectomy with stable postoperative fluid collection adjacent to the ventral aspect of the pancreatic head. No findings to suggest metastatic disease in the abdomen or pelvis. 2. Hepatic steatosis with small cavernous hemangioma in segment 4A of the liver. 3. Slight decreased size of nonenlarged gastrohepatic ligament lymph node, presumably benign. 4. Aortic atherosclerosis.    10/16/2018 Imaging   Status post distal pancreatectomy. Stable postoperative seroma along the surgical margin.   Status post hysterectomy and right oophorectomy.   Status post right hemicolectomy with appendectomy.   No evidence of recurrent or metastatic disease.   No colonic wall thickening or mass is evident on CT.   01/22/2019 Tumor Marker   Patient's tumor was tested for the following markers: CA-19.9 Results of the tumor marker test revealed 5   01/22/2019 Imaging   1. No evidence of local recurrence or metastatic disease status post distal pancreatectomy and splenectomy. 2. Stable small seroma anterior to the pancreatic head. 3. Postsurgical changes as described. 4. Stable additional incidental findings including a hepatic hemangioma, nonobstructing bilateral renal calculi and aortic Atherosclerosis (ICD10-I70.0).   04/18/2019 Imaging   1. Status post distal pancreatectomy and splenectomy. There has been a gradual increase in ill-defined soft tissue and celiac axis/SMA origin lymph nodes adjacent to the distal pancreatectomy margin and lesser curvature of the stomach/gastric body over sequential prior examinations dated 01/22/2019 and 09/26/2018. An enlarged lymph node or soft tissue nodule adjacent to the SMA origin now measures  1.8 x 0.8 cm. Findings are concerning for local recurrence of pancreatic malignancy. 2. Separately from the above findings, there remains a 1.0 cm low-attenuation nodule anterior to the remnant pancreatic neck, most consistent with postoperative seroma 3. No evidence  of distant metastatic disease in the chest, abdomen, or pelvis. 4. Postoperative findings of cholecystectomy, right hemicolectomy, and hysterectomy. 5. Bilateral nonobstructive nephrolithiasis. 6.  Aortic Atherosclerosis (ICD10-I70.0).       05/28/2019 Tumor Marker   Patient's tumor was tested for the following markers: CA19-9 Results of the tumor marker test revealed 18   05/28/2019 Imaging   1. Status post distal pancreatectomy with continued further progression of the ill-defined soft tissue to the left of the celiac axis/SMA origin, now measuring 1.9 x 1.9 cm and highly concerning for recurrent/metastatic disease. This process abuts both the celiac axis, SMA, and posterior wall of the mid stomach. PET-CT may prove helpful to further evaluate. 2. Bandlike ill-defined soft tissue in the anterior abdomen, potentially peritoneal or omental is similar to perhaps minimally increased qualitatively in the interval. Close attention on follow-up recommended. 3. Left paraumbilical hernia contains a short segment of small bowel without complicating features. 4. Ill-defined very subtle area of decreased attenuation in the posterior aspect of hepatic segment IV. This is likely focal fatty deposition. Close attention on follow-up recommended. 5.  Aortic Atherosclerois (ICD10-170.0)   06/07/2019 Pathology Results   Malignant cells consistent with adenocarcinoma   06/07/2019 Procedure   ENDOSCOPIC FINDING (limited views with linear echoendoscope): :      The examined esophagus was endoscopically normal.      The entire examined stomach was endoscopically normal.      ENDOSONOGRAPHIC FINDING: :      1. An irregular mass was identified in the  region of the pancreatic tail resection site. The mass was stellate with very poorly defined borders. Fine needle aspiration for cytology was performed. Color Doppler imaging was utilized prior to needle puncture to confirm a lack of significant vascular structures within the needle path. Four passes were made with the 25 gauge (FNB) needle using a transgastric approach. A cytotechnologist was present to evaluate the adequacy of the specimen. Final cytology results are pending. Impression:               -  Irregular, stellate soft tissue mass in the region of the pancreatic tail resection site. This was sampled with four transgastric passes with an EUS FNB needle..   06/18/2019 - 08/14/2019 Chemotherapy   The patient had FOLFIRINOX for chemotherapy treatment.     09/03/2019 Imaging   1. Findings are again highly concerning for locally recurrent disease adjacent to the pancreatectomy bed, with enlarging soft tissue mass which is intimately associated with the superior mesenteric artery, celiac axis and superior aspect of the left renal  vein, as detailed above. 2. No evidence of metastatic disease in the thorax. 3. 2.7 x 1.2 cm cavernous hemangioma in segment 4A of the liver again noted. 4. Small nonobstructive calculi in the collecting systems of both kidneys measuring up to 3 mm in the lower pole collecting system of the left kidney. 5. Aortic atherosclerosis. 6. Additional incidental findings, as above.   11/16/2019 Imaging   Similar-appearing ill-defined soft tissue the left of the celiac axis/SMA, most compatible with recurrent/metastatic disease.   Similar ill-defined nodularity within the anterior upper abdomen, potentially peritoneal or omental. Continued attention on follow-up is recommended.   12/31/2019 Tumor Marker   Patient's tumor was tested for the following markers: CA-19-9 Results of the tumor marker test revealed 298   01/10/2020 Imaging   1. Numerous small hypoenhancing liver  masses scattered throughout the liver, all new since 11/16/2019 CT, compatible with new liver metastases. 2. Stable upper  retroperitoneal mass encasing the celiac axis and abutting the pancreatic resection margin, compatible with stable locally recurrent tumor. 3. Stable subcentimeter indistinct anterior upper peritoneal implants. 4. No evidence of metastatic disease in the chest. 5. Chronic findings include: Nonobstructing left nephrolithiasis. Stable small left paraumbilical hernia containing a small bowel loop without acute bowel complication. Aortic Atherosclerosis (ICD10-I70.0).     01/19/2020 -  Chemotherapy    Patient is on Treatment Plan: PANCREATIC ABRAXANE / GEMCITABINE D1,8,15 Q28D      01/25/2020 Tumor Marker   Patient's tumor was tested for the following markers: CA-19-9 Results of the tumor marker test revealed 1104.   03/04/2020 Tumor Marker   Patient's tumor was tested for the following markers: CA-19-9 Results of the tumor marker test revealed 581   04/11/2020 Imaging   1. Essentially stable appearance of the soft tissue density process along the distal pancreatectomy site, abutting the posterior portion of the stomach, celiac trunk, left lateral margin of the SMA, and potentially the superior margin of the left renal vein. This could be inflammatory but residual tumor not excluded. 2. Some of the liver lesions are mildly reduced in size and some of the liver lesions are stable in size. 3. Further reduction in omental nodularity. 4. Small paraumbilical hernia contains a loop of small bowel, without findings of strangulation or obstruction. 5. Small supraumbilical hernias containing adipose tissue. 6. Small nonobstructive left renal calculus.   04/11/2020 Tumor Marker   Patient's tumor was tested for the following markers: CA19-9 Results of the tumor marker test revealed 451     REVIEW OF SYSTEMS:   Constitutional: Denies fevers, chills or abnormal weight loss Eyes:  Denies blurriness of vision Ears, nose, mouth, throat, and face: Denies mucositis or sore throat Respiratory: Denies cough, dyspnea or wheezes Cardiovascular: Denies palpitation, chest discomfort or lower extremity swelling Gastrointestinal:  Denies nausea, heartburn or change in bowel habits Skin: Denies abnormal skin rashes Lymphatics: Denies new lymphadenopathy or easy bruising Neurological:Denies numbness, tingling or new weaknesses Behavioral/Psych: Mood is stable, no new changes  All other systems were reviewed with the patient and are negative.  I have reviewed the past medical history, past surgical history, social history and family history with the patient and they are unchanged from previous note.  ALLERGIES:  is allergic to codeine, penicillins, and bactrim [sulfamethoxazole-trimethoprim].  MEDICATIONS:  Current Outpatient Medications  Medication Sig Dispense Refill  . cetirizine (ZYRTEC) 10 MG tablet Take 10 mg by mouth daily.    . citalopram (CELEXA) 20 MG tablet TAKE 1 TABLET BY MOUTH EVERY DAY 90 tablet 4  . CREON 24000-76000 units CPEP Take 1 capsule by mouth See admin instructions. Take 1 capsule before each meal and 1 capsule after each meal, take 1 cap with snacks    . diclofenac Sodium (VOLTAREN) 1 % GEL APPLY 2-4 GRAMS TO AFFECTED JOINT 4 TIMES DAILY AS NEEDED. 400 g 1  . estradiol (ESTRACE VAGINAL) 0.1 MG/GM vaginal cream Place 1 Applicatorful vaginally 3 (three) times a week. 42.5 g 12  . fluticasone (FLONASE) 50 MCG/ACT nasal spray Place 1 spray into both nostrils daily.    Marland Kitchen gabapentin (NEURONTIN) 300 MG capsule Take 1 capsule (300 mg total) by mouth 3 (three) times daily. 90 capsule 11  . HYDROmorphone (DILAUDID) 4 MG tablet Take 1 tablet (4 mg total) by mouth every 4 (four) hours as needed. 60 tablet 0  . hydrOXYzine (ATARAX/VISTARIL) 10 MG tablet Take 1 tablet (10 mg total) by mouth 3 (three) times  daily as needed. 30 tablet 1  . lidocaine-prilocaine (EMLA)  cream Apply 1 application topically daily as needed (port). Apply to affected area once 30 g 11  . LORazepam (ATIVAN) 0.5 MG tablet Take 1 tablet (0.5 mg total) by mouth every 8 (eight) hours as needed for anxiety (nausea). 30 tablet 0  . metFORMIN (GLUCOPHAGE) 500 MG tablet Take 2 tablets (1,000 mg total) by mouth 2 (two) times daily with a meal. TAKE 1 TABLET BY MOUTH TWICE A DAY WITH A MEAL 180 tablet 11  . methocarbamol (ROBAXIN) 500 MG tablet TAKE 1 TABLET BY MOUTH THREE TIMES A DAY AS NEEDED FOR MUSCLE SPASMS 90 tablet 0  . Multiple Vitamin (MULTIVITAMIN WITH MINERALS) TABS tablet Take 1 tablet by mouth daily.    Marland Kitchen omeprazole (PRILOSEC) 40 MG capsule TAKE 1 CAPSULE BY MOUTH EVERY DAY 90 capsule 3  . ondansetron (ZOFRAN) 8 MG tablet TAKE 1 TAB BY MOUTH EVERY 8HOURS AS NEEDED FOR REFRACTORY NAUSEA/VOMITING START ON DAY 3AFTER CHEMO 8 tablet 7  . predniSONE (DELTASONE) 5 MG tablet Take 1 tablet (5 mg total) by mouth daily with breakfast. 60 tablet 2  . prochlorperazine (COMPAZINE) 10 MG tablet TAKE 1 TABLET BY MOUTH EVERY 6 HOURS AS NEEDED 90 tablet 3  . rivaroxaban (XARELTO) 20 MG TABS tablet Take 1 tablet (20 mg total) by mouth daily with supper. 30 tablet 9  . simethicone (MYLICON) 001 MG chewable tablet Chew 125 mg by mouth every 6 (six) hours as needed for flatulence.     No current facility-administered medications for this visit.   Facility-Administered Medications Ordered in Other Visits  Medication Dose Route Frequency Provider Last Rate Last Admin  . gemcitabine (GEMZAR) 1,254 mg in sodium chloride 0.9 % 250 mL chemo infusion  600 mg/m2 (Treatment Plan Recorded) Intravenous Once , , MD      . heparin lock flush 100 unit/mL  500 Units Intracatheter Once PRN Alvy Bimler, , MD      . PACLitaxel-protein bound (ABRAXANE) chemo infusion 200 mg  100 mg/m2 (Treatment Plan Recorded) Intravenous Once Alvy Bimler, , MD 80 mL/hr at 05/13/20 1320 200 mg at 05/13/20 1320  . sodium chloride  flush (NS) 0.9 % injection 10 mL  10 mL Intracatheter PRN Alvy Bimler, , MD        PHYSICAL EXAMINATION: ECOG PERFORMANCE STATUS: 1 - Symptomatic but completely ambulatory  Vitals:   05/13/20 1120  BP: 133/80  Pulse: 82  Resp: 18  Temp: 98.7 F (37.1 C)  SpO2: 99%   Filed Weights   05/13/20 1120  Weight: 232 lb 6.4 oz (105.4 kg)    GENERAL:alert, no distress and comfortable.  She looks mildly cushingoid SKIN: skin color, texture, turgor are normal, no rashes or significant lesions EYES: normal, Conjunctiva are pink and non-injected, sclera clear OROPHARYNX:no exudate, no erythema and lips, buccal mucosa, and tongue normal  NECK: supple, thyroid normal size, non-tender, without nodularity LYMPH:  no palpable lymphadenopathy in the cervical, axillary or inguinal LUNGS: clear to auscultation and percussion with normal breathing effort HEART: regular rate & rhythm and no murmurs and no lower extremity edema ABDOMEN:abdomen soft, non-tender and normal bowel sounds Musculoskeletal:no cyanosis of digits and no clubbing  NEURO: alert & oriented x 3 with fluent speech, no focal motor/sensory deficits  LABORATORY DATA:  I have reviewed the data as listed    Component Value Date/Time   NA 138 05/13/2020 1105   K 4.4 05/13/2020 1105   CL 101 05/13/2020 1105  CO2 27 05/13/2020 1105   GLUCOSE 176 (H) 05/13/2020 1105   BUN 8 05/13/2020 1105   CREATININE 0.73 05/13/2020 1105   CREATININE 0.68 07/27/2017 0851   CALCIUM 8.7 (L) 05/13/2020 1105   PROT 6.3 (L) 05/13/2020 1105   ALBUMIN 2.9 (L) 05/13/2020 1105   AST 67 (H) 05/13/2020 1105   ALT 151 (H) 05/13/2020 1105   ALKPHOS 212 (H) 05/13/2020 1105   BILITOT 0.5 05/13/2020 1105   GFRNONAA >60 05/13/2020 1105   GFRNONAA 106 07/27/2017 0851   GFRAA >60 11/09/2019 1012   GFRAA >60 09/27/2019 1535   GFRAA 123 07/27/2017 0851    No results found for: SPEP, UPEP  Lab Results  Component Value Date   WBC 9.0 05/13/2020   NEUTROABS  4.7 05/13/2020   HGB 10.7 (L) 05/13/2020   HCT 34.0 (L) 05/13/2020   MCV 95.2 05/13/2020   PLT 392 05/13/2020      Chemistry      Component Value Date/Time   NA 138 05/13/2020 1105   K 4.4 05/13/2020 1105   CL 101 05/13/2020 1105   CO2 27 05/13/2020 1105   BUN 8 05/13/2020 1105   CREATININE 0.73 05/13/2020 1105   CREATININE 0.68 07/27/2017 0851      Component Value Date/Time   CALCIUM 8.7 (L) 05/13/2020 1105   ALKPHOS 212 (H) 05/13/2020 1105   AST 67 (H) 05/13/2020 1105   ALT 151 (H) 05/13/2020 1105   BILITOT 0.5 05/13/2020 1105

## 2020-05-13 NOTE — Progress Notes (Signed)
Clarified dose of Abraxane today should be at 100 mg/m2 and all future doses.  Updated today's dose to 100 mg/m2 and Dr Alvy Bimler updated all future doses.  T.O. Dr Lesli Albee, PharmD 05/13/20 @ 1230

## 2020-05-13 NOTE — Assessment & Plan Note (Signed)
She has recent uncontrolled diabetes She will continue increased dose of Metformin and aggressive lifestyle changes and dietary modification

## 2020-05-13 NOTE — Progress Notes (Signed)
Per Dr Alvy Bimler, ok to treat with elevated LFT's.

## 2020-05-13 NOTE — Assessment & Plan Note (Signed)
She has multifactorial cause of pain Her pain is due to her disease in her abdomen as well as recent flare of rheumatoid arthritis She will continue current dose of prescribed pain medicine

## 2020-05-16 ENCOUNTER — Other Ambulatory Visit: Payer: Self-pay | Admitting: Hematology and Oncology

## 2020-05-16 ENCOUNTER — Telehealth: Payer: Self-pay

## 2020-05-16 DIAGNOSIS — M1990 Unspecified osteoarthritis, unspecified site: Secondary | ICD-10-CM

## 2020-05-16 DIAGNOSIS — B370 Candidal stomatitis: Secondary | ICD-10-CM

## 2020-05-16 MED ORDER — NAPROXEN 500 MG PO TABS: 500.0000 mg | ORAL_TABLET | Freq: Two times a day (BID) | ORAL | 0 refills | Status: DC

## 2020-05-16 MED ORDER — NYSTATIN 100000 UNIT/ML MT SUSP: 5.0000 mL | Freq: Four times a day (QID) | OROMUCOSAL | 0 refills | Status: DC

## 2020-05-16 NOTE — Telephone Encounter (Signed)
Abnormal liver enzymes can cause fever Naproxen can be more effective than aleve in reducing fever, I will send to her pharmacy Nystatin is good for thrush, I will send in prescription Please advise her to call back Monday for update

## 2020-05-16 NOTE — Telephone Encounter (Signed)
Called and given below message. She verbalized understanding. She will call back Monday with update.

## 2020-05-16 NOTE — Telephone Encounter (Signed)
She called and left a message to call her.  Called back. On Wednesday am she woke up with a headache and fever. She took Tylenol and felt better/ fever went away.  On Thursday she had a fever of 103, she took Aleve and it did not help. She took Tylenol x 1 time yesterday and the fever went away. Today she feels good and no fever.    She is asking should she take the tylenol if needed for fever if it comes back? She is worried about her liver enzymes. She now has thrush on her tongue. Do you want to prescribe anything?

## 2020-05-27 ENCOUNTER — Inpatient Hospital Stay: Payer: BC Managed Care – PPO

## 2020-05-27 ENCOUNTER — Other Ambulatory Visit: Payer: Self-pay

## 2020-05-27 ENCOUNTER — Inpatient Hospital Stay: Payer: BC Managed Care – PPO | Attending: Hematology and Oncology

## 2020-05-27 ENCOUNTER — Other Ambulatory Visit: Payer: Self-pay | Admitting: Hematology and Oncology

## 2020-05-27 VITALS — BP 124/86 | HR 73 | Temp 98.4°F | Resp 18

## 2020-05-27 DIAGNOSIS — C252 Malignant neoplasm of tail of pancreas: Secondary | ICD-10-CM

## 2020-05-27 DIAGNOSIS — C562 Malignant neoplasm of left ovary: Secondary | ICD-10-CM | POA: Diagnosis not present

## 2020-05-27 DIAGNOSIS — C541 Malignant neoplasm of endometrium: Secondary | ICD-10-CM

## 2020-05-27 DIAGNOSIS — R978 Other abnormal tumor markers: Secondary | ICD-10-CM | POA: Insufficient documentation

## 2020-05-27 DIAGNOSIS — Z5111 Encounter for antineoplastic chemotherapy: Secondary | ICD-10-CM | POA: Diagnosis not present

## 2020-05-27 DIAGNOSIS — C7961 Secondary malignant neoplasm of right ovary: Secondary | ICD-10-CM

## 2020-05-27 DIAGNOSIS — Z7189 Other specified counseling: Secondary | ICD-10-CM

## 2020-05-27 LAB — CMP (CANCER CENTER ONLY)
ALT: 175 U/L — ABNORMAL HIGH (ref 0–44)
AST: 98 U/L — ABNORMAL HIGH (ref 15–41)
Albumin: 3.4 g/dL — ABNORMAL LOW (ref 3.5–5.0)
Alkaline Phosphatase: 297 U/L — ABNORMAL HIGH (ref 38–126)
Anion gap: 14 (ref 5–15)
BUN: 10 mg/dL (ref 6–20)
CO2: 25 mmol/L (ref 22–32)
Calcium: 8.8 mg/dL — ABNORMAL LOW (ref 8.9–10.3)
Chloride: 100 mmol/L (ref 98–111)
Creatinine: 0.73 mg/dL (ref 0.44–1.00)
GFR, Estimated: >60 mL/min (ref >60)
Glucose, Bld: 134 mg/dL — ABNORMAL HIGH (ref 70–99)
Potassium: 4.3 mmol/L (ref 3.5–5.1)
Sodium: 139 mmol/L (ref 135–145)
Total Bilirubin: 0.5 mg/dL (ref 0.3–1.2)
Total Protein: 7 g/dL (ref 6.5–8.1)

## 2020-05-27 LAB — CBC WITH DIFFERENTIAL (CANCER CENTER ONLY)
Abs Immature Granulocytes: 0.08 10*3/uL — ABNORMAL HIGH (ref 0.00–0.07)
Basophils Absolute: 0.1 10*3/uL (ref 0.0–0.1)
Basophils Relative: 1 %
Eosinophils Absolute: 0.2 10*3/uL (ref 0.0–0.5)
Eosinophils Relative: 2 %
HCT: 38.1 % (ref 36.0–46.0)
Hemoglobin: 12.1 g/dL (ref 12.0–15.0)
Immature Granulocytes: 1 %
Lymphocytes Relative: 22 %
Lymphs Abs: 2.6 10*3/uL (ref 0.7–4.0)
MCH: 30 pg (ref 26.0–34.0)
MCHC: 31.8 g/dL (ref 30.0–36.0)
MCV: 94.3 fL (ref 80.0–100.0)
Monocytes Absolute: 1.6 10*3/uL — ABNORMAL HIGH (ref 0.1–1.0)
Monocytes Relative: 14 %
Neutro Abs: 7.4 10*3/uL (ref 1.7–7.7)
Neutrophils Relative %: 60 %
Platelet Count: 689 10*3/uL — ABNORMAL HIGH (ref 150–400)
RBC: 4.04 MIL/uL (ref 3.87–5.11)
RDW: 17.6 % — ABNORMAL HIGH (ref 11.5–15.5)
WBC Count: 12 10*3/uL — ABNORMAL HIGH (ref 4.0–10.5)
nRBC: 0 % (ref 0.0–0.2)

## 2020-05-27 MED ORDER — ONDANSETRON HCL 4 MG/2ML IJ SOLN
8.0000 mg | Freq: Once | INTRAMUSCULAR | Status: AC
  Administered 2020-05-27: 8 mg via INTRAVENOUS

## 2020-05-27 MED ORDER — SODIUM CHLORIDE 0.9 % IV SOLN
600.0000 mg/m2 | Freq: Once | INTRAVENOUS | Status: AC
  Administered 2020-05-27: 1292 mg via INTRAVENOUS
  Filled 2020-05-27: qty 33.98

## 2020-05-27 MED ORDER — SODIUM CHLORIDE 0.9 % IV SOLN
Freq: Once | INTRAVENOUS | Status: AC
Start: 2020-05-27 — End: 2020-05-27
  Filled 2020-05-27: qty 250

## 2020-05-27 MED ORDER — ONDANSETRON HCL 4 MG/2ML IJ SOLN
INTRAMUSCULAR | Status: AC
  Filled 2020-05-27: qty 4

## 2020-05-27 MED ORDER — SODIUM CHLORIDE 0.9% FLUSH
10.0000 mL | INTRAVENOUS | Status: DC | PRN
  Administered 2020-05-27: 10 mL
  Filled 2020-05-27: qty 10

## 2020-05-27 MED ORDER — PACLITAXEL PROTEIN-BOUND CHEMO INJECTION 100 MG
200.0000 mg | Freq: Once | INTRAVENOUS | Status: AC
  Administered 2020-05-27: 200 mg via INTRAVENOUS
  Filled 2020-05-27: qty 40

## 2020-05-27 MED ORDER — HEPARIN SOD (PORK) LOCK FLUSH 100 UNIT/ML IV SOLN
500.0000 [IU] | Freq: Once | INTRAVENOUS | Status: AC | PRN
  Administered 2020-05-27: 500 [IU]
  Filled 2020-05-27: qty 5

## 2020-05-27 NOTE — Progress Notes (Signed)
Okay to proceed with treatment per Dr. Alvy Bimler related to today's labs.

## 2020-05-27 NOTE — Patient Instructions (Signed)
[facility] Discharge Instructions for Patients Receiving Chemotherapy  Today you received the following chemotherapy agents: Gemzar, Abraxane   To help prevent nausea and vomiting after your treatment, we encourage you to take your nausea medication as directed.    If you develop nausea and vomiting that is not controlled by your nausea medication, call the clinic.   BELOW ARE SYMPTOMS THAT SHOULD BE REPORTED IMMEDIATELY:  *FEVER GREATER THAN 100.5 F  *CHILLS WITH OR WITHOUT FEVER  NAUSEA AND VOMITING THAT IS NOT CONTROLLED WITH YOUR NAUSEA MEDICATION  *UNUSUAL SHORTNESS OF BREATH  *UNUSUAL BRUISING OR BLEEDING  TENDERNESS IN MOUTH AND THROAT WITH OR WITHOUT PRESENCE OF ULCERS  *URINARY PROBLEMS  *BOWEL PROBLEMS  UNUSUAL RASH Items with * indicate a potential emergency and should be followed up as soon as possible.  Feel free to call the clinic should you have any questions or concerns. The clinic phone number is [phone].  Please show the CHEMO ALERT CARD at check-in to the Emergency Department and triage nurse.   

## 2020-05-28 ENCOUNTER — Telehealth: Payer: Self-pay | Admitting: Oncology

## 2020-05-28 ENCOUNTER — Other Ambulatory Visit: Payer: Self-pay | Admitting: Hematology and Oncology

## 2020-05-28 ENCOUNTER — Telehealth: Payer: Self-pay

## 2020-05-28 DIAGNOSIS — C541 Malignant neoplasm of endometrium: Secondary | ICD-10-CM

## 2020-05-28 DIAGNOSIS — C7961 Secondary malignant neoplasm of right ovary: Secondary | ICD-10-CM

## 2020-05-28 DIAGNOSIS — C252 Malignant neoplasm of tail of pancreas: Secondary | ICD-10-CM

## 2020-05-28 LAB — CA 125: Cancer Antigen (CA) 125: 46.9 U/mL — ABNORMAL HIGH (ref 0.0–38.1)

## 2020-05-28 LAB — CANCER ANTIGEN 19-9: CA 19-9: 3097 U/mL — ABNORMAL HIGH (ref 0–35)

## 2020-05-28 NOTE — Telephone Encounter (Signed)
Called [facility] and scheduled appointment with Dr. Alvy Bimler to review her next CT scan on 06/03/20 at 2:00.

## 2020-05-28 NOTE — Telephone Encounter (Signed)
-----   Message from Heath Lark, MD sent at 05/28/2020  8:54 AM EDT ----- Regarding: CT scan Pls call her, CA19-9 is very high I recommend ordering CT scan next week I am putting in order, tell her to call to schedule next wed if possible Once I know when it will be done, I will schedule rtn visit She can stop by whenever to get oral contrast

## 2020-05-28 NOTE — Telephone Encounter (Signed)
Spoke with pt per MD Alvy Bimler. Pt verbalizes understanding. This RN provided phone number for central scheduling. Pt verbalizes understanding and states that she will call to schedule her scan and pick up oral contrast at [facility] at her convenience.

## 2020-05-29 ENCOUNTER — Encounter: Payer: Self-pay | Admitting: Hematology and Oncology

## 2020-05-30 ENCOUNTER — Other Ambulatory Visit: Payer: Self-pay | Admitting: Hematology and Oncology

## 2020-05-30 MED ORDER — HYDROMORPHONE HCL 4 MG PO TABS: 4.0000 mg | ORAL_TABLET | ORAL | 0 refills | Status: DC | PRN

## 2020-06-03 ENCOUNTER — Ambulatory Visit (HOSPITAL_COMMUNITY)
Admission: RE | Admit: 2020-06-03 | Discharge: 2020-06-03 | Disposition: A | Discharge status: Home / Self Care | Payer: BC Managed Care – PPO | Source: Ambulatory Visit | Attending: Hematology and Oncology | Admitting: Hematology and Oncology

## 2020-06-03 ENCOUNTER — Telehealth: Payer: Self-pay

## 2020-06-03 ENCOUNTER — Encounter: Payer: Self-pay | Admitting: Hematology and Oncology

## 2020-06-03 ENCOUNTER — Encounter (HOSPITAL_COMMUNITY): Payer: Self-pay

## 2020-06-03 ENCOUNTER — Other Ambulatory Visit: Payer: Self-pay

## 2020-06-03 ENCOUNTER — Inpatient Hospital Stay (HOSPITAL_BASED_OUTPATIENT_CLINIC_OR_DEPARTMENT_OTHER): Payer: BC Managed Care – PPO | Admitting: Hematology and Oncology

## 2020-06-03 DIAGNOSIS — G893 Neoplasm related pain (acute) (chronic): Secondary | ICD-10-CM

## 2020-06-03 DIAGNOSIS — C7961 Secondary malignant neoplasm of right ovary: Secondary | ICD-10-CM | POA: Diagnosis not present

## 2020-06-03 DIAGNOSIS — R7989 Other specified abnormal findings of blood chemistry: Secondary | ICD-10-CM | POA: Diagnosis not present

## 2020-06-03 DIAGNOSIS — R0609 Other forms of dyspnea: Secondary | ICD-10-CM

## 2020-06-03 DIAGNOSIS — C562 Malignant neoplasm of left ovary: Secondary | ICD-10-CM | POA: Diagnosis not present

## 2020-06-03 DIAGNOSIS — C252 Malignant neoplasm of tail of pancreas: Secondary | ICD-10-CM | POA: Insufficient documentation

## 2020-06-03 DIAGNOSIS — Z8507 Personal history of malignant neoplasm of pancreas: Secondary | ICD-10-CM | POA: Diagnosis not present

## 2020-06-03 DIAGNOSIS — C541 Malignant neoplasm of endometrium: Secondary | ICD-10-CM | POA: Insufficient documentation

## 2020-06-03 DIAGNOSIS — Z5111 Encounter for antineoplastic chemotherapy: Secondary | ICD-10-CM | POA: Diagnosis not present

## 2020-06-03 DIAGNOSIS — R748 Abnormal levels of other serum enzymes: Secondary | ICD-10-CM

## 2020-06-03 DIAGNOSIS — Z85038 Personal history of other malignant neoplasm of large intestine: Secondary | ICD-10-CM | POA: Diagnosis not present

## 2020-06-03 DIAGNOSIS — R0600 Dyspnea, unspecified: Secondary | ICD-10-CM | POA: Diagnosis not present

## 2020-06-03 DIAGNOSIS — R978 Other abnormal tumor markers: Secondary | ICD-10-CM | POA: Diagnosis not present

## 2020-06-03 DIAGNOSIS — Z8543 Personal history of malignant neoplasm of ovary: Secondary | ICD-10-CM | POA: Diagnosis not present

## 2020-06-03 MED ORDER — IOHEXOL 300 MG/ML  SOLN
100.0000 mL | Freq: Once | INTRAMUSCULAR | Status: AC | PRN
  Administered 2020-06-03: 100 mL via INTRAVENOUS

## 2020-06-03 NOTE — Assessment & Plan Note (Signed)
She has profound shortness of breath and dyspnea on exertion Her liver is significantly enlarged on recent CT and appears to be pushing on her diaphragm I reassured the patient Her oxygen saturation is excellent I am hopeful with dietary modification and weight loss, her symptoms will improve

## 2020-06-03 NOTE — Assessment & Plan Note (Signed)
She has chronic pain secondary to her surgery and her disease I refill her prescription recently I anticipate she may have worsening bone pain with prednisone taper

## 2020-06-03 NOTE — Progress Notes (Signed)
[place] OFFICE PROGRESS NOTE  Patient Care Team: [name], MD as PCP - General (Family Medicine)  ASSESSMENT & PLAN:  Malignant neoplasm of tail of pancreas [facility]) I have reviewed multiple imaging studies with the patient and her husband The cause of her abnormal liver enzymes and elevated tumor markers is not from cancer but due to fatty liver change The patient is able to taper off prednisone completely Moving forward, we discussed different options  1) we proceed with combination chemotherapy but spacing out treatment to every 2 to 3 weeks 2) omit Abraxane and continue on Gemzar only 3) take chemotherapy holiday  The patient is not in favor of option 3 I have a lot of discussions about the risk and benefits of option one and option 2, she elected to proceed with option 2 starting next week I will remove Abraxane from her chemotherapy Plan to repeat imaging study again in 3 months, next due around July  Elevated liver enzymes Her profound elevated liver enzymes are not due to cancer progression Significant fatty liver change is seen The patient is able to taper off prednisone completely over the last 5 to 6 days without difficulties We discussed the importance of avoiding alcohol intake, aggressive dietary management and weight loss to help reverse the fatty liver disease As above, we will also omit Abraxane  Cancer associated pain She has chronic pain secondary to her surgery and her disease I refill her prescription recently I anticipate she may have worsening bone pain with prednisone taper  Dyspnea on exertion She has profound shortness of breath and dyspnea on exertion Her liver is significantly enlarged on recent CT and appears to be pushing on her diaphragm I reassured the patient Her oxygen saturation is excellent I am hopeful with dietary modification and weight loss, her symptoms will improve   No orders of the defined types were placed in  this encounter.   All questions were answered. The patient knows to call the clinic with any problems, questions or concerns. The total time spent in the appointment was 40 minutes encounter with patients including review of chart and various tests results, discussions about plan of care and coordination of care plan   Heath Lark, MD 06/03/2020 2:46 PM  INTERVAL HISTORY: Please see below for problem oriented charting. 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 Mixed attenuation mass emanates from the right adnexa measuring 10.8 x 8.0 cm very suspicious for right ovarian carcinoma. 2. Abnormality of the tail of the pancreas may be due to mild pancreatitis and small pseudocyst formation, but neoplasm cannot be excluded. 3. Small amount of ascites within abdomen and pelvis. 4. Small nonobstructing renal calculi bilaterally.    08/30/2017 Pathology Results   1. Uterus and cervix, with left fallopian tube - ENDOMETRIOID ADENOCARCINOMA, FIGO GRADE I, ARISING IN A BACKGROUND OF DIFFUSE COMPLEX ATYPICAL HYPERPLASIA. - CARCINOMA INVADES FOR OF DEPTH OF 0.2 CM WHERE THICKNESS OF MYOMETRIAL WALL IS 2.1 CM. - ALL RESECTION MARGINS ARE NEGATIVE FOR CARCINOMA. - NEGATIVE  FOR LYMPHOVASCULAR OR PERINEURAL INVASION. - CERVICAL STROMA IS NOT INVOLVED. - SEE ONCOLOGY TABLE. - SEE NOTE 2. Ovary and fallopian tube, right - PRIMARY OVARIAN ENDOMETRIOID ADENOCARCINOMA, FIGO GRADE II, 12 CM. - THE OVARIAN SURFACE IS FOCALLY INVOLVED  BY CARCINOMA. - NEGATIVE FOR LYMPHOVASCULAR INVASION. - BENIGN UNREMARKABLE FALLOPIAN TUBE, NEGATIVE FOR CARCINOMA. - SEE ONCOLOGY TABLE. - SEE NOTE 3. Cul-de-sac biopsy - METASTATIC ADENOCARCINOMA, MOST CONSISTENT WITH PRIMARY OVARIAN ENDOMETRIOID ADENOCARCINOMA. 4. Ovary, left - BENIGN UNREMARKABLE OVARY, NEGATIVE FOR MALIGNANCY. Microscopic Comment 1. UTERUS, CARCINOMA OR CARCINOSARCOMA Procedure: Total hysterectomy with bilateral salpingo-oophorectomy. Histologic type: Endometrioid adenocarcinoma. Histologic Grade: FIGO Grade I Myometrial invasion: Depth of invasion: 2 mm Myometrial thickness: 21 mm Uterine Serosa Involvement: Not identified Cervical stromal involvement: Not identified Extent of involvement of other organs: Not applicable Lymphovascular invasion: Not identified Regional Lymph Nodes: Examined: 0 Sentinel 0 Non-sentinel 0 Total Tumor block for ancillary studies: 1H MMR / MSI testing: Pending Pathologic Stage Classification (pTNM, AJCC 8th edition): pT1a, pNX (v4.1.0.0) 2. OVARY or FALLOPIAN TUBE or PRIMARY PERITONEUM: Procedure: Salpingo-oophorectomy Specimen Integrity: Intact Tumor Site: Right ovary Ovarian Surface Involvement (required only if applicable): Focally involved by carcinoma Fallopian Tube Surface Involvement (required only if applicable): Not identified Tumor Size: 12 cm Histologic Type: Endometrioid adenocarcinoma Histologic Grade: Grade II Implants (required for advanced stage serous/seromucinous borderline tumors only): Not applicable Other Tissue/ Organ Involvement: Cul de sac biopsy involved by tumor Largest Extrapelvic Peritoneal Focus (required only if applicable): Not applicable Peritoneal/Ascitic Fluid: Negative for carcinoma (case # [id number]) Treatment Effect (required only for high-grade serous carcinomas): Not applicable Regional Lymph Nodes: No lymph nodes submitted or found Number of Lymph Nodes Examined: 0 Pathologic  Stage Classification (pTNM, AJCC 8th Edition): pT2b, pN0 Representative Tumor Block: 2B and 2E 1. Molecular study for microsatellite instability and immunohistochemical stains for MMR-related proteins are pending and will be reported in an addendum. 2. Immunohistochemical stain show that the ovarian tumor is positive for CK7 and PAX8 (both diffuse), CDX2 (patchy and weak); and negative for CK20. This immunoprofile is consistent with the above diagnosis. Dr. Lyndon Code has reviewed this case and concurs with the above diagnosis. Molecular study for microsatellite instability and immunohistochemical stains for MMR-related proteins are pending and will be reported in an addendum   08/30/2017 Genetic Testing   Patient has genetic testing done for MSI  Results revealed patient has the following mutation(s): loss of [place] [employer] [facility] [employer] [place] [employer] 2   08/30/2017 Surgery   Surgeon: Mart Piggs, MD Pre-operative Diagnosis:  1. Adnexal mass 2. Abnormal uterine bleeding 3. H/o Cecal CA  Post-operative Diagnosis:  1. Adhesive disease post colon resection 2. Endometrial cancer NOS 3. Adenocarcinoma unknown origin, right ovary, suspicious for GI primary  Operation:  1. Lysis of adhesions ~30 minutes 2. Robotic-assisted laparoscopic total hysterectomy with right salpingo-oophorectomy and left salpingectomy 3. Left oophorectomy (RA-laparoscopic) 4. Pelvic washings  Findings: Adhesions of omentum to anterior abdominal wall. Enlarged cystic right ovary ~10cm. Uterus had small nodules on serosa near where right adnexa was intimate with the surface. No obvious intraoperative rupture of cyst, although in 2 areas the wall was thin and one of these areas had some bleeding. Slight scarring of left bladder dome to LUS/cervix. Uterus on frozen section c/w hyperplasia and a small focus of endometrial CA - no myo invasion, <2cm in size. Frozen section on the right adnexa was carcinoma, met from colon or possibly Gyn, favor GI primary, defer  to permanent. Left ovary was WNL.  09/15/2017 Cancer Staging   Staging form: Corpus Uteri - Carcinoma and Carcinosarcoma, AJCC 8th Edition - Pathologic: FIGO Stage IA (pT1a, pN0, cM0) - Signed by Heath Lark, MD on 09/15/2017   09/26/2017 Procedure   Successful placement of a right internal jugular approach power injectable Port-A-Cath. The catheter is ready for immediate use.   11/07/2017 Imaging   1. Since 08/18/2017, similar to slight decrease in size of a pancreatic body/tail junction lesion. Cross modality comparison relative to 09/16/2017 MRI is also grossly similar. 2. No evidence of metastatic disease. 3. Aortic Atherosclerosis (ICD10-I70.0).  4. Left nephrolithiasis.   11/18/2017 Genetic Testing   MSH2 c.2237dupT pathogenic mutation identified in the CancerNext panel.  The CancerNext gene panel offered by [employer] includes sequencing and rearrangement analysis for the following 34 genes:   APC, ATM, BARD1, BMPR1A, BRCA1, BRCA2, BRIP1, CDH1, CDK4, CDKN2A, CHEK2, DICER1, HOXB13, EPCAM, GREM1, MLH1, MRE11A, MSH2, MSH6, MUTYH, NBN, NF1, PALB2, PMS2, POLD1, POLE, PTEN, RAD50, RAD51C, RAD51D, SMAD4, SMARCA4, STK11, and TP53.  The report date is November 18, 2017.  MSH2 [id number] pathogenic mutation identified on somatic testing.  These results are consistent with a diagnosis of Lynch syndrome.   06/18/2019 - 08/14/2019 Chemotherapy   The patient had FOLFIRINOX for chemotherapy treatment.     01/19/2020 -  Chemotherapy    Patient is on Treatment Plan: PANCREATIC ABRAXANE / GEMCITABINE D1,8,15 Q28D      04/11/2020 Imaging   1. Essentially stable appearance of the soft tissue density process along the distal pancreatectomy site, abutting the posterior portion of the stomach, celiac trunk, left lateral margin of the SMA, and potentially the superior margin of the left renal vein. This could be inflammatory but residual tumor not excluded. 2. Some of the liver lesions  are mildly reduced in size and some of the liver lesions are stable in size. 3. Further reduction in omental nodularity. 4. Small paraumbilical hernia contains a loop of small bowel, without findings of strangulation or obstruction. 5. Small supraumbilical hernias containing adipose tissue. 6. Small nonobstructive left renal calculus.   Secondary malignant neoplasm of right ovary ([place])  08/23/2017 Tumor Marker   Patient's tumor was tested for the following markers: CA-125 Results of the tumor marker test revealed 139.8   08/31/2017 Initial Diagnosis   Secondary malignant neoplasm of right ovary ([place])   09/15/2017 Cancer Staging   Staging form: Ovary, Fallopian Tube, and Primary Peritoneal Carcinoma, AJCC 8th Edition - Pathologic: Stage IIB (pT2b, pN0, cM0) - Signed by Heath Lark, MD on 09/15/2017   09/27/2017 Imaging   No evidence of metastatic disease or other acute findings within the thorax.  4 cm low-attenuation mass in pancreatic tail, highly suspicious for pancreatic carcinoma. This is caused splenic vein thrombosis, with new venous collaterals in the left upper quadrant. Consider endoscopic ultrasound with FNA for tissue diagnosis.  Stable benign hepatic hemangioma.    09/27/2017 Tumor Marker   Patient's tumor was tested for the following markers: CA-125 Results of the tumor marker test revealed 39.4   11/02/2017 Tumor Marker   Patient's tumor was tested for the following markers: CA-125 Results of the tumor marker test revealed 19   11/07/2017 Tumor Marker   Patient's tumor was tested for the following markers: CA-125 Results of the tumor marker test revealed 18.3   11/18/2017 Genetic Testing   MSH2 c.2237dupT pathogenic mutation identified in the CancerNext panel.  The CancerNext gene panel offered by [employer] includes sequencing and rearrangement analysis for the following 34  genes:   APC, ATM, BARD1, BMPR1A, BRCA1, BRCA2, BRIP1, CDH1, CDK4, CDKN2A, CHEK2, DICER1,  HOXB13, EPCAM, GREM1, MLH1, MRE11A, MSH2, MSH6, MUTYH, NBN, NF1, PALB2, PMS2, POLD1, POLE, PTEN, RAD50, RAD51C, RAD51D, SMAD4, SMARCA4, STK11, and TP53.  The report date is November 18, 2017.  MSH2 [id number] pathogenic mutation identified on somatic testing.  These results are consistent with a diagnosis of Lynch syndrome.   12/15/2017 Tumor Marker   Patient's tumor was tested for the following markers: CA-125 Results of the tumor marker test revealed 57.3   01/16/2018 Tumor Marker   Patient's tumor was tested for the following markers: CA-125 Results of the tumor marker test revealed 24.2   04/10/2018 Tumor Marker   Patient's tumor was tested for the following markers: CA-125 Results of the tumor marker test revealed 18.2   07/12/2018 Tumor Marker   Patient's tumor was tested for the following markers: CA-125 Results of the tumor marker test revealed 11.8   10/16/2018 Tumor Marker   Patient's tumor was tested for the following markers: CA125 Results of the tumor marker test revealed 12.4.   01/18/2020 Tumor Marker   Patient's tumor was tested for the following markers: CA-125 Results of the tumor marker test revealed 18   Malignant neoplasm of tail of pancreas ([place])  10/13/2017 Pathology Results   Pancreas tail mass, endoscopic ultrasound-guided, fine needle aspiration II (smears and cell block): Adenocarcinoma   11/18/2017 Genetic Testing   MSH2 c.2237dupT pathogenic mutation identified in the CancerNext panel.  The CancerNext gene panel offered by [employer] includes sequencing and rearrangement analysis for the following 34 genes:   APC, ATM, BARD1, BMPR1A, BRCA1, BRCA2, BRIP1, CDH1, CDK4, CDKN2A, CHEK2, DICER1, HOXB13, EPCAM, GREM1, MLH1, MRE11A, MSH2, MSH6, MUTYH, NBN, NF1, PALB2, PMS2, POLD1, POLE, PTEN, RAD50, RAD51C, RAD51D, SMAD4, SMARCA4, STK11, and TP53.  The report date is November 18, 2017.  MSH2 [id number] pathogenic mutation identified  on somatic testing.  These results are consistent with a diagnosis of Lynch syndrome.   11/24/2017 Pathology Results   A. "TAIL OF PANCREAS AND SPLEEN", DISTAL PANCREATECTOMY AND SPLENECTOMY: Invasive ductal adenocarcinoma, moderately to poorly differentiated with focal signet ring cell features, of pancreas (distal).   The carcinoma is 2.5 cm in greatest dimension grossly.   Treatment effects present in the form of fibrosis (50%). No lymphovascular or definite perineural invasion identified. All surgical margins are negative for tumor or high-grade dysplasia. Adjacent mucinous neoplasm, most consistent with Intraductal papillary mucinous neoplasm (IPMN) with low-grade dysplasia.   Uninvolved pancreas show atrophy and focal acute inflammation.   Twelve benign lymph nodes (0/12). Spleen with no significant histopathologic abnormalities.  PROCEDURE: distal pancreatectomy and splenectomy TUMOR SITE: distal pancreas TUMOR SIZE:  GREATEST DIMENSION: 2.5 cm  ADDITIONAL DIMENSIONS:  x  cm HISTOLOGIC TYPE: ductal adenocarcinoma HISTOLOGIC GRADE: grade 3 TUMOR EXTENSION: peripancreatic soft tissue MARGINS: negative for tumor TREATMENT EFFECT: treatment effects present in the form of fibrosis (50%). LYMPHOVASCULAR INVASION: not identified PERINEURAL INVASION: no definite evidence REGIONAL LYMPH NODES:   NUMBER OF LYMPH NODES INVOLVED: 0   NUMBER OF LYMPH NODES EXAMINED: 12 PATHOLOGIC STAGE CLASSIFICATION (pTNM, AJCC 8th Ed): ypT2, ypN0 DISTANT METASTASIS (pM): pMx ADDITIONAL PATHOLOGIC FINDINGS: mucinous neoplasm, most consistent with intraductal papillary mucinous neoplasm (IPMN) with low-grade dysplasia, is identified adjacent to the main tumor.   11/24/2017 Cancer Staging   Staging form: Exocrine Pancreas, AJCC 8th Edition - Pathologic stage from 11/24/2017: Stage IB (pT2, pN0, cM0) - Signed by Truitt Merle, MD on 01/03/2018  11/25/2017 Surgery   She had surgery at [facility] 1. Exploratory Laparotomy 2. Distal Pancreatectomy and Splenectomy 3. Intraoperative Ultrasound 4. Open Cholecystectomy    12/15/2017 Cancer Staging   Staging form: Exocrine Pancreas, AJCC 8th Edition - Clinical: Stage IB (cT2, cN0, cM0) - Signed by Heath Lark, MD on 12/15/2017   12/28/2017 Imaging   12/28/2017 CT Abdomen  IMPRESSION: 1. Postoperative findings from recent partial pancreatectomy including a 21 cubic cm fluid collection along the pancreatic resection margin which could represent early pseudocyst. 2. Nodularity along the lateral limb of the left adrenal gland could also be postoperative but merit surveillance, as the pancreatic lesion was in close proximity to this adrenal gland on the prior CT of 11/07/2017. 3. Asymmetric fullness inferiorly in the left breast. The patient has a history of prior left breast procedures, correlation with mammography is recommended. 4. Other imaging findings of potential clinical significance: Stable hemangioma in the left hepatic lobe. Splenectomy. Aortic Atherosclerosis (ICD10-I70.0). Right hemicolectomy. Small focus of fat necrosis in the right anterior abdominal wall subcutaneous tissues near the laparotomy site. Bilateral nonobstructive nephrolithiasis.   01/02/2018 Tumor Marker   Patient's tumor was tested for the following markers: CA-19-9 Results of the tumor marker test revealed 5   01/03/2018 - 06/05/2018 Chemotherapy   She received modified dose FOLFIRINOX   04/10/2018 Imaging   1. Distal pancreatectomy, without findings of recurrent or metastatic disease. 2. Incompletely imaged hypoenhancement within lower lobe right pulmonary artery branch is likely chronic (but interval since 10/09/2017) pulmonary embolism. Dedicated CTA could further evaluate. 3. Decrease in size of a peripancreatic complex fluid collection anteriorly, likely a resolving pseudocyst. 4. Decreased size of  minimal fluid versus a borderline sized node in the gastrohepatic ligament. Recommend attention on follow-up. 5. Hepatic steatosis with a segment 4 hemangioma. 6. Aortic Atherosclerosis (ICD10-I70.0). This is significantly age advanced.   07/12/2018 Tumor Marker   Patient's tumor was tested for the following markers: CA-19-9 Results of the tumor marker test revealed 6   07/12/2018 Imaging   1. Status post distal pancreatectomy with stable postoperative fluid collection adjacent to the ventral aspect of the pancreatic head. No findings to suggest metastatic disease in the abdomen or pelvis. 2. Hepatic steatosis with small cavernous hemangioma in segment 4A of the liver. 3. Slight decreased size of nonenlarged gastrohepatic ligament lymph node, presumably benign. 4. Aortic atherosclerosis.    10/16/2018 Imaging   Status post distal pancreatectomy. Stable postoperative seroma along the surgical margin.   Status post hysterectomy and right oophorectomy.   Status post right hemicolectomy with appendectomy.   No evidence of recurrent or metastatic disease.   No colonic wall thickening or mass is evident on CT.   01/22/2019 Tumor Marker   Patient's tumor was tested for the following markers: CA-19.9 Results of the tumor marker test revealed 5   01/22/2019 Imaging   1. No evidence of local recurrence or metastatic disease status post distal pancreatectomy and splenectomy. 2. Stable small seroma anterior to the pancreatic head. 3. Postsurgical changes as described. 4. Stable additional incidental findings including a hepatic hemangioma, nonobstructing bilateral renal calculi and aortic Atherosclerosis (ICD10-I70.0).   04/18/2019 Imaging   1. Status post distal pancreatectomy and splenectomy. There has been a gradual increase in ill-defined soft tissue and celiac axis/SMA origin lymph nodes adjacent to the distal pancreatectomy margin and lesser curvature of the stomach/gastric body over  sequential prior examinations dated 01/22/2019 and 09/26/2018. An enlarged lymph node or soft tissue nodule adjacent to the  SMA origin now measures 1.8 x 0.8 cm. Findings are concerning for local recurrence of pancreatic malignancy. 2. Separately from the above findings, there remains a 1.0 cm low-attenuation nodule anterior to the remnant pancreatic neck, most consistent with postoperative seroma 3. No evidence of distant metastatic disease in the chest, abdomen, or pelvis. 4. Postoperative findings of cholecystectomy, right hemicolectomy, and hysterectomy. 5. Bilateral nonobstructive nephrolithiasis. 6.  Aortic Atherosclerosis (ICD10-I70.0).       05/28/2019 Tumor Marker   Patient's tumor was tested for the following markers: CA19-9 Results of the tumor marker test revealed 18   05/28/2019 Imaging   1. Status post distal pancreatectomy with continued further progression of the ill-defined soft tissue to the left of the celiac axis/SMA origin, now measuring 1.9 x 1.9 cm and highly concerning for recurrent/metastatic disease. This process abuts both the celiac axis, SMA, and posterior wall of the mid stomach. PET-CT may prove helpful to further evaluate. 2. Bandlike ill-defined soft tissue in the anterior abdomen, potentially peritoneal or omental is similar to perhaps minimally increased qualitatively in the interval. Close attention on follow-up recommended. 3. Left paraumbilical hernia contains a short segment of small bowel without complicating features. 4. Ill-defined very subtle area of decreased attenuation in the posterior aspect of hepatic segment IV. This is likely focal fatty deposition. Close attention on follow-up recommended. 5.  Aortic Atherosclerois (ICD10-170.0)   06/07/2019 Pathology Results   Malignant cells consistent with adenocarcinoma   06/07/2019 Procedure   ENDOSCOPIC FINDING (limited views with linear echoendoscope): :      The examined esophagus was endoscopically  normal.      The entire examined stomach was endoscopically normal.      ENDOSONOGRAPHIC FINDING: :      1. An irregular mass was identified in the region of the pancreatic tail resection site. The mass was stellate with very poorly defined borders. Fine needle aspiration for cytology was performed. Color Doppler imaging was utilized prior to needle puncture to confirm a lack of significant vascular structures within the needle path. Four passes were made with the 25 gauge (FNB) needle using a transgastric approach. A cytotechnologist was present to evaluate the adequacy of the specimen. Final cytology results are pending. Impression:               -  Irregular, stellate soft tissue mass in the region of the pancreatic tail resection site. This was sampled with four transgastric passes with an EUS FNB needle..   06/18/2019 - 08/14/2019 Chemotherapy   The patient had FOLFIRINOX for chemotherapy treatment.     09/03/2019 Imaging   1. Findings are again highly concerning for locally recurrent disease adjacent to the pancreatectomy bed, with enlarging soft tissue mass which is intimately associated with the superior mesenteric artery, celiac axis and superior aspect of the left renal  vein, as detailed above. 2. No evidence of metastatic disease in the thorax. 3. 2.7 x 1.2 cm cavernous hemangioma in segment 4A of the liver again noted. 4. Small nonobstructive calculi in the collecting systems of both kidneys measuring up to 3 mm in the lower pole collecting system of the left kidney. 5. Aortic atherosclerosis. 6. Additional incidental findings, as above.   11/16/2019 Imaging   Similar-appearing ill-defined soft tissue the left of the celiac axis/SMA, most compatible with recurrent/metastatic disease.   Similar ill-defined nodularity within the anterior upper abdomen, potentially peritoneal or omental. Continued attention on follow-up is recommended.   12/31/2019 Tumor Marker   Patient's tumor was  tested for the following markers: CA-19-9 Results of the tumor marker test revealed 298   01/10/2020 Imaging   1. Numerous small hypoenhancing liver masses scattered throughout the liver, all new since 11/16/2019 CT, compatible with new liver metastases. 2. Stable upper retroperitoneal mass encasing the celiac axis and abutting the pancreatic resection margin, compatible with stable locally recurrent tumor. 3. Stable subcentimeter indistinct anterior upper peritoneal implants. 4. No evidence of metastatic disease in the chest. 5. Chronic findings include: Nonobstructing left nephrolithiasis. Stable small left paraumbilical hernia containing a small bowel loop without acute bowel complication. Aortic Atherosclerosis (ICD10-I70.0).     01/19/2020 -  Chemotherapy    Patient is on Treatment Plan: PANCREATIC ABRAXANE / GEMCITABINE D1,8,15 Q28D      01/25/2020 Tumor Marker   Patient's tumor was tested for the following markers: CA-19-9 Results of the tumor marker test revealed 1104.   03/04/2020 Tumor Marker   Patient's tumor was tested for the following markers: CA-19-9 Results of the tumor marker test revealed 581   04/11/2020 Imaging   1. Essentially stable appearance of the soft tissue density process along the distal pancreatectomy site, abutting the posterior portion of the stomach, celiac trunk, left lateral margin of the SMA, and potentially the superior margin of the left renal vein. This could be inflammatory but residual tumor not excluded. 2. Some of the liver lesions are mildly reduced in size and some of the liver lesions are stable in size. 3. Further reduction in omental nodularity. 4. Small paraumbilical hernia contains a loop of small bowel, without findings of strangulation or obstruction. 5. Small supraumbilical hernias containing adipose tissue. 6. Small nonobstructive left renal calculus.   04/11/2020 Tumor Marker   Patient's tumor was tested for the following markers:  CA19-9 Results of the tumor marker test revealed 451   05/27/2020 Imaging   1. Stable chest CT, without acute findings or evidence of metastatic disease. 2. Assessment of the patient's liver disease is limited by progressive geographic hepatic steatosis. There is one central hepatic lesion which appears slightly more conspicuous, and this may be contributing to new intrahepatic biliary dilatation posteriorly in the right lobe. The other hepatic lesions are generally improved. No extrahepatic biliary dilatation. 3. 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 No apparent residual peritoneal disease. 4. Stable postsurgical changes and small hernias of the anterior abdominal wall.   05/28/2020 Tumor Marker   Patient's tumor was tested for the following markers: CA19-9 Results of the tumor marker test revealed 3097     REVIEW OF SYSTEMS:   Constitutional: Denies fevers, chills or abnormal weight loss Eyes: Denies blurriness of vision Ears, nose, mouth, throat, and face: Denies mucositis or sore throat Cardiovascular: Denies palpitation, chest discomfort or lower extremity swelling Gastrointestinal:  Denies nausea, heartburn or change in bowel habits Skin: Denies abnormal skin rashes Lymphatics: Denies new lymphadenopathy or easy bruising Neurological:Denies numbness, tingling or new weaknesses Behavioral/Psych: Mood is stable, no new changes  All other systems were reviewed with the patient and are negative.  I have reviewed the past medical history, past surgical history, social history and family history with the patient and they are unchanged from previous note.  ALLERGIES:  is allergic to codeine, penicillins, and bactrim [sulfamethoxazole-trimethoprim].  MEDICATIONS:  Current Outpatient Medications  Medication Sig Dispense Refill  . cetirizine (ZYRTEC) 10 MG tablet Take 10 mg by mouth daily.    . citalopram (CELEXA) 20 MG  tablet TAKE 1 TABLET  BY MOUTH EVERY DAY 90 tablet 4  . CREON 24000-76000 units CPEP Take 1 capsule by mouth See admin instructions. Take 1 capsule before each meal and 1 capsule after each meal, take 1 cap with snacks    . diclofenac Sodium (VOLTAREN) 1 % GEL APPLY 2-4 GRAMS TO AFFECTED JOINT 4 TIMES DAILY AS NEEDED. 400 g 1  . estradiol (ESTRACE VAGINAL) 0.1 MG/GM vaginal cream Place 1 Applicatorful vaginally 3 (three) times a week. 42.5 g 12  . fluticasone (FLONASE) 50 MCG/ACT nasal spray Place 1 spray into both nostrils daily.    Marland Kitchen gabapentin (NEURONTIN) 300 MG capsule Take 1 capsule (300 mg total) by mouth 3 (three) times daily. 90 capsule 11  . HYDROmorphone (DILAUDID) 4 MG tablet Take 1 tablet (4 mg total) by mouth every 4 (four) hours as needed. 60 tablet 0  . hydrOXYzine (ATARAX/VISTARIL) 10 MG tablet Take 1 tablet (10 mg total) by mouth 3 (three) times daily as needed. 30 tablet 1  . lidocaine-prilocaine (EMLA) cream Apply 1 application topically daily as needed (port). Apply to affected area once 30 g 11  . LORazepam (ATIVAN) 0.5 MG tablet Take 1 tablet (0.5 mg total) by mouth every 8 (eight) hours as needed for anxiety (nausea). 30 tablet 0  . metFORMIN (GLUCOPHAGE) 500 MG tablet Take 2 tablets (1,000 mg total) by mouth 2 (two) times daily with a meal. TAKE 1 TABLET BY MOUTH TWICE A DAY WITH A MEAL 180 tablet 11  . methocarbamol (ROBAXIN) 500 MG tablet TAKE 1 TABLET BY MOUTH THREE TIMES A DAY AS NEEDED FOR MUSCLE SPASMS 90 tablet 0  . Multiple Vitamin (MULTIVITAMIN WITH MINERALS) TABS tablet Take 1 tablet by mouth daily.    . naproxen (NAPROSYN) 500 MG tablet Take 1 tablet (500 mg total) by mouth 2 (two) times daily with a meal. 30 tablet 0  . omeprazole (PRILOSEC) 40 MG capsule TAKE 1 CAPSULE BY MOUTH EVERY DAY 90 capsule 3  . ondansetron (ZOFRAN) 8 MG tablet TAKE 1 TAB BY MOUTH EVERY 8HOURS AS NEEDED FOR REFRACTORY NAUSEA/VOMITING START ON DAY 3AFTER CHEMO 8 tablet 7  . prochlorperazine  (COMPAZINE) 10 MG tablet TAKE 1 TABLET BY MOUTH EVERY 6 HOURS AS NEEDED 90 tablet 3  . rivaroxaban (XARELTO) 20 MG TABS tablet Take 1 tablet (20 mg total) by mouth daily with supper. 30 tablet 9  . simethicone (MYLICON) 119 MG chewable tablet Chew 125 mg by mouth every 6 (six) hours as needed for flatulence.     No current facility-administered medications for this visit.    PHYSICAL EXAMINATION: ECOG PERFORMANCE STATUS: 1 - Symptomatic but completely ambulatory  Vitals:   06/03/20 1418  BP: (!) 149/97  Pulse: 69  Resp: 17  Temp: 98.3 F (36.8 C)  SpO2: 100%   Filed Weights   06/03/20 1418  Weight: 230 lb 13.2 oz (104.7 kg)    GENERAL:alert, no distress and comfortable NEURO: alert & oriented x 3 with fluent speech, no focal motor/sensory deficits  LABORATORY DATA:  I have reviewed the data as listed    Component Value Date/Time   NA 139 05/27/2020 1049   K 4.3 05/27/2020 1049   CL 100 05/27/2020 1049   CO2 25 05/27/2020 1049   GLUCOSE 134 (H) 05/27/2020 1049   BUN 10 05/27/2020 1049   CREATININE 0.73 05/27/2020 1049   CREATININE 0.68 07/27/2017 0851   CALCIUM 8.8 (L) 05/27/2020 1049   PROT 7.0 05/27/2020 1049   ALBUMIN 3.4 (L) 05/27/2020  1049   AST 98 (H) 05/27/2020 1049   ALT 175 (H) 05/27/2020 1049   ALKPHOS 297 (H) 05/27/2020 1049   BILITOT 0.5 05/27/2020 1049   GFRNONAA >60 05/27/2020 1049   GFRNONAA 106 07/27/2017 0851   GFRAA >60 11/09/2019 1012   GFRAA >60 09/27/2019 1535   GFRAA 123 07/27/2017 0851    No results found for: SPEP, UPEP  Lab Results  Component Value Date   WBC 12.0 (H) 05/27/2020   NEUTROABS 7.4 05/27/2020   HGB 12.1 05/27/2020   HCT 38.1 05/27/2020   MCV 94.3 05/27/2020   PLT 689 (H) 05/27/2020      Chemistry      Component Value Date/Time   NA 139 05/27/2020 1049   K 4.3 05/27/2020 1049   CL 100 05/27/2020 1049   CO2 25 05/27/2020 1049   BUN 10 05/27/2020 1049   CREATININE 0.73 05/27/2020 1049   CREATININE 0.68  07/27/2017 0851      Component Value Date/Time   CALCIUM 8.8 (L) 05/27/2020 1049   ALKPHOS 297 (H) 05/27/2020 1049   AST 98 (H) 05/27/2020 1049   ALT 175 (H) 05/27/2020 1049   BILITOT 0.5 05/27/2020 1049       RADIOGRAPHIC STUDIES: I have reviewed multiple imaging studies with the patient and her husband I have personally reviewed the radiological images as listed and agreed with the findings in the report. CT CHEST ABDOMEN PELVIS W CONTRAST  Result Date: 06/03/2020 CLINICAL DATA:  Elevated liver function studies. History of multiple malignancies, including reported pancreatic cancer, colon cancer and ovarian/uterine cancer. Per previous radiology reports history of pancreatic resection margin recurrence 06/07/2019 with subsequent chemotherapy. Assess response to chemotherapy. EXAM: CT CHEST, ABDOMEN, AND PELVIS WITH CONTRAST TECHNIQUE: Multidetector CT imaging of the chest, abdomen and pelvis was performed following the standard protocol during bolus administration of intravenous contrast. CONTRAST:  152m OMNIPAQUE IOHEXOL 300 MG/ML  SOLN COMPARISON:  Prior CTs 04/11/2020 and 01/10/2020. FINDINGS: CT CHEST FINDINGS Cardiovascular: Right IJ Port-A-Cath extends to the level of the upper right atrium. No acute vascular findings. The heart size is normal. There is no pericardial effusion. Mediastinum/Nodes: There are no enlarged mediastinal, hilar or axillary lymph nodes. The thyroid gland, trachea and esophagus demonstrate no significant findings. Lungs/Pleura: There is no pleural effusion or pneumothorax. The lungs are clear, without suspicious nodularity. Musculoskeletal/Chest wall: No chest wall mass or suspicious osseous findings. CT ABDOMEN AND PELVIS FINDINGS Hepatobiliary: Previously demonstrated hepatic steatosis is now more heterogeneous with a geographic distribution. This makes identification of the underlying liver lesions more challenging. However, most of the lesions are less conspicuous  and appear improved. A lesion superior to the cholecystectomy bed is slightly more prominent, measuring up to 9 mm on image 83/7. A 6 mm lesion laterally in the caudate lobe (image 44/2) is not significantly changed. Previously characterized hemangioma measuring 2.6 x 1.6 cm in segment 4A (image 71/7) is unchanged. There is new intrahepatic biliary dilatation posteriorly in the right hepatic lobe without clear etiology. No significant extrahepatic biliary dilatation post cholecystectomy. Pancreas: Stable appearance of the pancreatic head status post distal pancreatectomy. There is no pancreatic ductal dilatation. Soft tissue mass along the superior aspect of the pancreatectomy margin and lateral to the superior mesenteric artery is unchanged, measuring 2.8 x 1.6 cm on image 99/7. Spleen: Status post splenectomy. Adrenals/Urinary Tract: Both adrenal glands appear normal. There is a small nonobstructing calculus in the lower pole of the left kidney. Both kidneys otherwise appear normal. No  evidence of ureteral calculus or hydronephrosis. The bladder appears normal. Stomach/Bowel: Enteric contrast was administered and has passed into the distal small bowel. The stomach appears unremarkable for its degree of distension. No evidence of bowel wall thickening, distention or surrounding inflammatory change. Stable postsurgical changes status post right hemicolectomy. Vascular/Lymphatic: There are no enlarged abdominal or pelvic lymph nodes. Minimal aortic and branch vessel atherosclerosis without acute vascular findings. The portal, superior mesenteric and splenic veins are patent. Reproductive: Hysterectomy. No adnexal mass. Probable mild pelvic floor laxity. Other: Postsurgical changes in the anterior abdominal wall with stable small supraumbilical hernias containing fat and small bowel. No evidence of incarceration or obstruction. No ascites or definite residual peritoneal nodularity. Musculoskeletal: No acute or  significant osseous findings. IMPRESSION: 1. Stable chest CT, without acute findings or evidence of metastatic disease. 2. Assessment of the patient's liver disease is limited by progressive geographic hepatic steatosis. There is one central hepatic lesion which appears slightly more conspicuous, and this may be contributing to new intrahepatic biliary dilatation posteriorly in the right lobe. The other hepatic lesions are generally improved. No extrahepatic biliary dilatation. 3. 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 No apparent residual peritoneal disease. 4. Stable postsurgical changes and small hernias of the anterior abdominal wall. Electronically Signed   By: Richardean Sale M.D.   On: 06/03/2020 13:30

## 2020-06-03 NOTE — Assessment & Plan Note (Signed)
I have reviewed multiple imaging studies with the patient and her husband The cause of her abnormal liver enzymes and elevated tumor markers is not from cancer but due to fatty liver change The patient is able to taper off prednisone completely Moving forward, we discussed different options  1) we proceed with combination chemotherapy but spacing out treatment to every 2 to 3 weeks 2) omit Abraxane and continue on Gemzar only 3) take chemotherapy holiday  The patient is not in favor of option 3 I have a lot of discussions about the risk and benefits of option one and option 2, she elected to proceed with option 2 starting next week I will remove Abraxane from her chemotherapy Plan to repeat imaging study again in 3 months, next due around July

## 2020-06-03 NOTE — Assessment & Plan Note (Signed)
Her profound elevated liver enzymes are not due to cancer progression Significant fatty liver change is seen The patient is able to taper off prednisone completely over the last 5 to 6 days without difficulties We discussed the importance of avoiding alcohol intake, aggressive dietary management and weight loss to help reverse the fatty liver disease As above, we will also omit Abraxane

## 2020-06-03 NOTE — Telephone Encounter (Signed)
Called and left a message to call the office regarding today's office appt at 2 pm. She arrived late due to the check in process out front.

## 2020-06-10 ENCOUNTER — Ambulatory Visit: Payer: BC Managed Care – PPO | Admitting: Hematology and Oncology

## 2020-06-10 ENCOUNTER — Other Ambulatory Visit: Payer: BC Managed Care – PPO

## 2020-06-10 ENCOUNTER — Inpatient Hospital Stay: Payer: BC Managed Care – PPO

## 2020-06-10 ENCOUNTER — Other Ambulatory Visit: Payer: Self-pay

## 2020-06-10 VITALS — BP 158/77 | HR 66 | Temp 98.6°F | Resp 17 | Wt 229.0 lb

## 2020-06-10 DIAGNOSIS — R978 Other abnormal tumor markers: Secondary | ICD-10-CM | POA: Diagnosis not present

## 2020-06-10 DIAGNOSIS — C252 Malignant neoplasm of tail of pancreas: Secondary | ICD-10-CM

## 2020-06-10 DIAGNOSIS — C7961 Secondary malignant neoplasm of right ovary: Secondary | ICD-10-CM

## 2020-06-10 DIAGNOSIS — Z7189 Other specified counseling: Secondary | ICD-10-CM

## 2020-06-10 DIAGNOSIS — C541 Malignant neoplasm of endometrium: Secondary | ICD-10-CM

## 2020-06-10 DIAGNOSIS — C562 Malignant neoplasm of left ovary: Secondary | ICD-10-CM | POA: Diagnosis not present

## 2020-06-10 DIAGNOSIS — Z5111 Encounter for antineoplastic chemotherapy: Secondary | ICD-10-CM | POA: Diagnosis not present

## 2020-06-10 LAB — CMP (CANCER CENTER ONLY)
ALT: 72 U/L — ABNORMAL HIGH (ref 0–44)
AST: 53 U/L — ABNORMAL HIGH (ref 15–41)
Albumin: 3.1 g/dL — ABNORMAL LOW (ref 3.5–5.0)
Alkaline Phosphatase: 227 U/L — ABNORMAL HIGH (ref 38–126)
Anion gap: 11 (ref 5–15)
BUN: 11 mg/dL (ref 6–20)
CO2: 27 mmol/L (ref 22–32)
Calcium: 9.5 mg/dL (ref 8.9–10.3)
Chloride: 101 mmol/L (ref 98–111)
Creatinine: 0.71 mg/dL (ref 0.44–1.00)
GFR, Estimated: >60 mL/min (ref >60)
Glucose, Bld: 113 mg/dL — ABNORMAL HIGH (ref 70–99)
Potassium: 3.9 mmol/L (ref 3.5–5.1)
Sodium: 139 mmol/L (ref 135–145)
Total Bilirubin: 0.6 mg/dL (ref 0.3–1.2)
Total Protein: 6.5 g/dL (ref 6.5–8.1)

## 2020-06-10 LAB — CBC WITH DIFFERENTIAL (CANCER CENTER ONLY)
Abs Immature Granulocytes: 0.02 10*3/uL (ref 0.00–0.07)
Basophils Absolute: 0.1 10*3/uL (ref 0.0–0.1)
Basophils Relative: 1 %
Eosinophils Absolute: 0.2 10*3/uL (ref 0.0–0.5)
Eosinophils Relative: 3 %
HCT: 36.9 % (ref 36.0–46.0)
Hemoglobin: 11.9 g/dL — ABNORMAL LOW (ref 12.0–15.0)
Immature Granulocytes: 0 %
Lymphocytes Relative: 31 %
Lymphs Abs: 2.7 10*3/uL (ref 0.7–4.0)
MCH: 30.1 pg (ref 26.0–34.0)
MCHC: 32.2 g/dL (ref 30.0–36.0)
MCV: 93.2 fL (ref 80.0–100.0)
Monocytes Absolute: 1.9 10*3/uL — ABNORMAL HIGH (ref 0.1–1.0)
Monocytes Relative: 22 %
Neutro Abs: 3.8 10*3/uL (ref 1.7–7.7)
Neutrophils Relative %: 43 %
Platelet Count: 268 10*3/uL (ref 150–400)
RBC: 3.96 MIL/uL (ref 3.87–5.11)
RDW: 16.8 % — ABNORMAL HIGH (ref 11.5–15.5)
WBC Count: 8.6 10*3/uL (ref 4.0–10.5)
nRBC: 0 % (ref 0.0–0.2)

## 2020-06-10 MED ORDER — SODIUM CHLORIDE 0.9 % IV SOLN
Freq: Once | INTRAVENOUS | Status: AC
  Filled 2020-06-10: qty 250

## 2020-06-10 MED ORDER — SODIUM CHLORIDE 0.9% FLUSH
10.0000 mL | INTRAVENOUS | Status: DC | PRN
  Administered 2020-06-10: 10 mL
  Filled 2020-06-10: qty 10

## 2020-06-10 MED ORDER — ONDANSETRON HCL 4 MG/2ML IJ SOLN
8.0000 mg | Freq: Once | INTRAMUSCULAR | Status: AC
  Administered 2020-06-10: 8 mg via INTRAVENOUS

## 2020-06-10 MED ORDER — ONDANSETRON HCL 4 MG/2ML IJ SOLN
INTRAMUSCULAR | Status: AC
  Filled 2020-06-10: qty 2

## 2020-06-10 MED ORDER — ONDANSETRON HCL 4 MG/2ML IJ SOLN
INTRAMUSCULAR | Status: AC
  Filled 2020-06-10: qty 4

## 2020-06-10 MED ORDER — SODIUM CHLORIDE 0.9% FLUSH
10.0000 mL | Freq: Once | INTRAVENOUS | Status: AC
  Administered 2020-06-10: 10 mL
  Filled 2020-06-10: qty 10

## 2020-06-10 MED ORDER — HEPARIN SOD (PORK) LOCK FLUSH 100 UNIT/ML IV SOLN
500.0000 [IU] | Freq: Once | INTRAVENOUS | Status: AC | PRN
  Administered 2020-06-10: 500 [IU]
  Filled 2020-06-10: qty 5

## 2020-06-10 MED ORDER — SODIUM CHLORIDE 0.9 % IV SOLN
600.0000 mg/m2 | Freq: Once | INTRAVENOUS | Status: AC
  Administered 2020-06-10: 1292 mg via INTRAVENOUS
  Filled 2020-06-10: qty 33.98

## 2020-06-10 NOTE — Patient Instructions (Signed)
[facility] °Discharge Instructions for Patients Receiving Chemotherapy ° °Today you received the following chemotherapy agents Gemzar ° °To help prevent nausea and vomiting after your treatment, we encourage you to take your nausea medication as directed. °  °If you develop nausea and vomiting that is not controlled by your nausea medication, call the clinic.  ° °BELOW ARE SYMPTOMS THAT SHOULD BE REPORTED IMMEDIATELY: °· *FEVER GREATER THAN 100.5 F °· *CHILLS WITH OR WITHOUT FEVER °· NAUSEA AND VOMITING THAT IS NOT CONTROLLED WITH YOUR NAUSEA MEDICATION °· *UNUSUAL SHORTNESS OF BREATH °· *UNUSUAL BRUISING OR BLEEDING °· TENDERNESS IN MOUTH AND THROAT WITH OR WITHOUT PRESENCE OF ULCERS °· *URINARY PROBLEMS °· *BOWEL PROBLEMS °· UNUSUAL RASH °Items with * indicate a potential emergency and should be followed up as soon as possible. ° °Feel free to call the clinic should you have any questions or concerns. The clinic phone number is [phone]. ° °Please show the CHEMO ALERT CARD at check-in to the Emergency Department and triage nurse. ° ° °

## 2020-06-24 ENCOUNTER — Other Ambulatory Visit: Payer: BC Managed Care – PPO

## 2020-06-24 ENCOUNTER — Other Ambulatory Visit: Payer: Self-pay

## 2020-06-24 ENCOUNTER — Inpatient Hospital Stay (HOSPITAL_BASED_OUTPATIENT_CLINIC_OR_DEPARTMENT_OTHER): Payer: BC Managed Care – PPO | Admitting: Hematology and Oncology

## 2020-06-24 ENCOUNTER — Inpatient Hospital Stay: Payer: BC Managed Care – PPO | Attending: Hematology and Oncology

## 2020-06-24 ENCOUNTER — Inpatient Hospital Stay: Payer: BC Managed Care – PPO

## 2020-06-24 ENCOUNTER — Encounter: Payer: Self-pay | Admitting: Hematology and Oncology

## 2020-06-24 DIAGNOSIS — C252 Malignant neoplasm of tail of pancreas: Secondary | ICD-10-CM

## 2020-06-24 DIAGNOSIS — Z7189 Other specified counseling: Secondary | ICD-10-CM

## 2020-06-24 DIAGNOSIS — R1012 Left upper quadrant pain: Secondary | ICD-10-CM | POA: Insufficient documentation

## 2020-06-24 DIAGNOSIS — K5900 Constipation, unspecified: Secondary | ICD-10-CM | POA: Insufficient documentation

## 2020-06-24 DIAGNOSIS — J0100 Acute maxillary sinusitis, unspecified: Secondary | ICD-10-CM | POA: Insufficient documentation

## 2020-06-24 DIAGNOSIS — Z5111 Encounter for antineoplastic chemotherapy: Secondary | ICD-10-CM | POA: Insufficient documentation

## 2020-06-24 DIAGNOSIS — Z9289 Personal history of other medical treatment: Secondary | ICD-10-CM | POA: Insufficient documentation

## 2020-06-24 DIAGNOSIS — C7961 Secondary malignant neoplasm of right ovary: Secondary | ICD-10-CM | POA: Diagnosis not present

## 2020-06-24 DIAGNOSIS — R748 Abnormal levels of other serum enzymes: Secondary | ICD-10-CM

## 2020-06-24 DIAGNOSIS — E099 Drug or chemical induced diabetes mellitus without complications: Secondary | ICD-10-CM

## 2020-06-24 DIAGNOSIS — R5383 Other fatigue: Secondary | ICD-10-CM | POA: Diagnosis not present

## 2020-06-24 DIAGNOSIS — C801 Malignant (primary) neoplasm, unspecified: Secondary | ICD-10-CM | POA: Insufficient documentation

## 2020-06-24 DIAGNOSIS — T380X5D Adverse effect of glucocorticoids and synthetic analogues, subsequent encounter: Secondary | ICD-10-CM

## 2020-06-24 DIAGNOSIS — R739 Hyperglycemia, unspecified: Secondary | ICD-10-CM | POA: Insufficient documentation

## 2020-06-24 DIAGNOSIS — G893 Neoplasm related pain (acute) (chronic): Secondary | ICD-10-CM | POA: Diagnosis not present

## 2020-06-24 DIAGNOSIS — C541 Malignant neoplasm of endometrium: Secondary | ICD-10-CM

## 2020-06-24 DIAGNOSIS — F418 Other specified anxiety disorders: Secondary | ICD-10-CM | POA: Insufficient documentation

## 2020-06-24 DIAGNOSIS — K219 Gastro-esophageal reflux disease without esophagitis: Secondary | ICD-10-CM | POA: Insufficient documentation

## 2020-06-24 LAB — CBC WITH DIFFERENTIAL (CANCER CENTER ONLY)
Abs Immature Granulocytes: 0.03 10*3/uL (ref 0.00–0.07)
Basophils Absolute: 0.1 10*3/uL (ref 0.0–0.1)
Basophils Relative: 1 %
Eosinophils Absolute: 0.3 10*3/uL (ref 0.0–0.5)
Eosinophils Relative: 3 %
HCT: 36.4 % (ref 36.0–46.0)
Hemoglobin: 11.7 g/dL — ABNORMAL LOW (ref 12.0–15.0)
Immature Granulocytes: 0 %
Lymphocytes Relative: 30 %
Lymphs Abs: 2.4 10*3/uL (ref 0.7–4.0)
MCH: 30.3 pg (ref 26.0–34.0)
MCHC: 32.1 g/dL (ref 30.0–36.0)
MCV: 94.3 fL (ref 80.0–100.0)
Monocytes Absolute: 1.9 10*3/uL — ABNORMAL HIGH (ref 0.1–1.0)
Monocytes Relative: 22 %
Neutro Abs: 3.6 10*3/uL (ref 1.7–7.7)
Neutrophils Relative %: 44 %
Platelet Count: 521 10*3/uL — ABNORMAL HIGH (ref 150–400)
RBC: 3.86 MIL/uL — ABNORMAL LOW (ref 3.87–5.11)
RDW: 17.6 % — ABNORMAL HIGH (ref 11.5–15.5)
WBC Count: 8.3 10*3/uL (ref 4.0–10.5)
nRBC: 0 % (ref 0.0–0.2)

## 2020-06-24 LAB — CMP (CANCER CENTER ONLY)
ALT: 79 U/L — ABNORMAL HIGH (ref 0–44)
AST: 68 U/L — ABNORMAL HIGH (ref 15–41)
Albumin: 3 g/dL — ABNORMAL LOW (ref 3.5–5.0)
Alkaline Phosphatase: 227 U/L — ABNORMAL HIGH (ref 38–126)
Anion gap: 9 (ref 5–15)
BUN: 9 mg/dL (ref 6–20)
CO2: 27 mmol/L (ref 22–32)
Calcium: 8.8 mg/dL — ABNORMAL LOW (ref 8.9–10.3)
Chloride: 102 mmol/L (ref 98–111)
Creatinine: 0.7 mg/dL (ref 0.44–1.00)
GFR, Estimated: >60 mL/min (ref >60)
Glucose, Bld: 197 mg/dL — ABNORMAL HIGH (ref 70–99)
Potassium: 4.2 mmol/L (ref 3.5–5.1)
Sodium: 138 mmol/L (ref 135–145)
Total Bilirubin: 0.4 mg/dL (ref 0.3–1.2)
Total Protein: 6.1 g/dL — ABNORMAL LOW (ref 6.5–8.1)

## 2020-06-24 LAB — TSH: TSH: 1.106 u[IU]/mL (ref 0.350–4.500)

## 2020-06-24 LAB — T4, FREE: Free T4: 0.86 ng/dL (ref 0.61–1.12)

## 2020-06-24 MED ORDER — ONDANSETRON HCL 4 MG/2ML IJ SOLN
INTRAMUSCULAR | Status: AC
  Filled 2020-06-24: qty 4

## 2020-06-24 MED ORDER — SODIUM CHLORIDE 0.9 % IV SOLN: 8.0000 mg | Freq: Once | INTRAVENOUS | Status: DC

## 2020-06-24 MED ORDER — ONDANSETRON HCL 4 MG/2ML IJ SOLN
8.0000 mg | Freq: Once | INTRAMUSCULAR | Status: AC
  Administered 2020-06-24: 8 mg via INTRAVENOUS

## 2020-06-24 MED ORDER — SODIUM CHLORIDE 0.9% FLUSH
10.0000 mL | INTRAVENOUS | Status: DC | PRN
  Administered 2020-06-24: 10 mL
  Filled 2020-06-24: qty 10

## 2020-06-24 MED ORDER — SODIUM CHLORIDE 0.9 % IV SOLN
600.0000 mg/m2 | Freq: Once | INTRAVENOUS | Status: AC
  Administered 2020-06-24: 1292 mg via INTRAVENOUS
  Filled 2020-06-24: qty 33.98

## 2020-06-24 MED ORDER — SODIUM CHLORIDE 0.9 % IV SOLN
Freq: Once | INTRAVENOUS | Status: AC
  Filled 2020-06-24: qty 250

## 2020-06-24 MED ORDER — SODIUM CHLORIDE 0.9% FLUSH
10.0000 mL | Freq: Once | INTRAVENOUS | Status: AC
  Administered 2020-06-24: 10 mL
  Filled 2020-06-24: qty 10

## 2020-06-24 MED ORDER — HEPARIN SOD (PORK) LOCK FLUSH 100 UNIT/ML IV SOLN
500.0000 [IU] | Freq: Once | INTRAVENOUS | Status: AC | PRN
  Administered 2020-06-24: 500 [IU]
  Filled 2020-06-24: qty 5

## 2020-06-24 NOTE — Assessment & Plan Note (Signed)
She has chronic pain secondary to her disease and inflammatory arthritis I refill her prescription recently I anticipate she may have worsening bone pain with prednisone taper

## 2020-06-24 NOTE — Assessment & Plan Note (Signed)
She complained of excessive fatigue I will check thyroid function test

## 2020-06-24 NOTE — Assessment & Plan Note (Signed)
She has persistent hyperglycemia She will continue metformin and aggressive dietary changes

## 2020-06-24 NOTE — Assessment & Plan Note (Signed)
Her profound elevated liver enzymes are not due to cancer progression Significant fatty liver change is seen The patient is able to taper off prednisone completely without difficulties Her liver enzymes are stable She will continue risk factor modification with dietary changes and weight loss

## 2020-06-24 NOTE — Progress Notes (Signed)
[place] OFFICE PROGRESS NOTE  Patient Care Team: [name], MD as PCP - General (Family Medicine)  ASSESSMENT & PLAN:  Malignant neoplasm of tail of pancreas [facility]) Per previous discussion, we will continue on maintenance gemcitabine every 2 weeks, with dose reduction due to abnormal liver enzymes Her counts are stable Apart from some joint pain which is unrelated, she tolerated treatment very well For now, we will continue treatment indefinitely Plan to repeat imaging study again in July  Elevated liver enzymes Her profound elevated liver enzymes are not due to cancer progression Significant fatty liver change is seen The patient is able to taper off prednisone completely without difficulties Her liver enzymes are stable She will continue risk factor modification with dietary changes and weight loss  Cancer associated pain She has chronic pain secondary to her disease and inflammatory arthritis I refill her prescription recently I anticipate she may have worsening bone pain with prednisone taper  Steroid-induced diabetes ([place]) She has persistent hyperglycemia She will continue metformin and aggressive dietary changes  Other fatigue She complained of excessive fatigue I will check thyroid function test   Orders Placed This Encounter  Procedures  . TSH    Standing Status:   Future    Number of Occurrences:   1    Standing Expiration Date:   06/24/2021  . T4, free    Standing Status:   Future    Number of Occurrences:   1    Standing Expiration Date:   06/24/2021    All questions were answered. The patient knows to call the clinic with any problems, questions or concerns. The total time spent in the appointment was 20 minutes encounter with patients including review of chart and various tests results, discussions about plan of care and coordination of care plan   Heath Lark, MD 06/24/2020 12:33 PM  INTERVAL HISTORY: Please see below for problem  oriented charting. 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 Mixed attenuation mass emanates from the right adnexa measuring 10.8 x 8.0 cm very suspicious for right ovarian carcinoma. 2. Abnormality of the tail of the pancreas may be due to mild pancreatitis and small pseudocyst formation, but neoplasm cannot be excluded. 3. Small amount of ascites within abdomen and pelvis. 4. Small nonobstructing renal calculi bilaterally.    08/30/2017 Pathology Results   1. Uterus and cervix, with left fallopian tube - ENDOMETRIOID ADENOCARCINOMA, FIGO GRADE I, ARISING IN A BACKGROUND OF DIFFUSE COMPLEX ATYPICAL HYPERPLASIA. - CARCINOMA INVADES FOR OF DEPTH OF 0.2 CM WHERE THICKNESS OF MYOMETRIAL WALL IS 2.1 CM. - ALL RESECTION MARGINS ARE NEGATIVE FOR CARCINOMA. - NEGATIVE FOR LYMPHOVASCULAR OR PERINEURAL INVASION. - CERVICAL STROMA IS NOT INVOLVED. - SEE ONCOLOGY TABLE. - SEE NOTE 2. Ovary and fallopian tube, right - PRIMARY OVARIAN ENDOMETRIOID ADENOCARCINOMA, FIGO GRADE II, 12 CM. - THE OVARIAN SURFACE IS FOCALLY INVOLVED BY CARCINOMA. - NEGATIVE FOR LYMPHOVASCULAR INVASION. - BENIGN UNREMARKABLE FALLOPIAN TUBE, NEGATIVE FOR CARCINOMA. - SEE ONCOLOGY TABLE. - SEE NOTE 3. Cul-de-sac biopsy - METASTATIC ADENOCARCINOMA, MOST CONSISTENT WITH  PRIMARY OVARIAN ENDOMETRIOID ADENOCARCINOMA. 4. Ovary, left - BENIGN UNREMARKABLE OVARY, NEGATIVE FOR MALIGNANCY. Microscopic Comment 1. UTERUS, CARCINOMA OR  CARCINOSARCOMA Procedure: Total hysterectomy with bilateral salpingo-oophorectomy. Histologic type: Endometrioid adenocarcinoma. Histologic Grade: FIGO Grade I Myometrial invasion: Depth of invasion: 2 mm Myometrial thickness: 21 mm Uterine Serosa Involvement: Not identified Cervical stromal involvement: Not identified Extent of involvement of other organs: Not applicable Lymphovascular invasion: Not identified Regional Lymph Nodes: Examined: 0 Sentinel 0 Non-sentinel 0 Total Tumor block for ancillary studies: 1H MMR / MSI testing: Pending Pathologic Stage Classification (pTNM, AJCC 8th edition): pT1a, pNX (v4.1.0.0) 2. OVARY or FALLOPIAN TUBE or PRIMARY PERITONEUM: Procedure: Salpingo-oophorectomy Specimen Integrity: Intact Tumor Site: Right ovary Ovarian Surface Involvement (required only if applicable): Focally involved by carcinoma Fallopian Tube Surface Involvement (required only if applicable): Not identified Tumor Size: 12 cm Histologic Type: Endometrioid adenocarcinoma Histologic Grade: Grade II Implants (required for advanced stage serous/seromucinous borderline tumors only): Not applicable Other Tissue/ Organ Involvement: Cul de sac biopsy involved by tumor Largest Extrapelvic Peritoneal Focus (required only if applicable): Not applicable Peritoneal/Ascitic Fluid: Negative for carcinoma (case # [id number]) Treatment Effect (required only for high-grade serous carcinomas): Not applicable Regional Lymph Nodes: No lymph nodes submitted or found Number of Lymph Nodes Examined: 0 Pathologic Stage Classification (pTNM, AJCC 8th Edition): pT2b, pN0 Representative Tumor Block: 2B and 2E 1. Molecular study for microsatellite instability and immunohistochemical stains for MMR-related proteins are pending and will be reported in an addendum. 2. Immunohistochemical stain show that the ovarian tumor is positive for CK7 and PAX8 (both diffuse), CDX2 (patchy and weak); and negative  for CK20. This immunoprofile is consistent with the above diagnosis. Dr. Lyndon Code has reviewed this case and concurs with the above diagnosis. Molecular study for microsatellite instability and immunohistochemical stains for MMR-related proteins are pending and will be reported in an addendum   08/30/2017 Genetic Testing   Patient has genetic testing done for MSI  Results revealed patient has the following mutation(s): loss of [facility] 2   08/30/2017 Surgery   Surgeon: Mart Piggs, MD Pre-operative Diagnosis:  1. Adnexal mass 2. Abnormal uterine bleeding 3. H/o Cecal CA  Post-operative Diagnosis:  1. Adhesive disease post colon resection 2. Endometrial cancer NOS 3. Adenocarcinoma unknown origin, right ovary, suspicious for GI primary  Operation:  1. Lysis of adhesions ~30 minutes 2. Robotic-assisted laparoscopic total hysterectomy with right salpingo-oophorectomy and left salpingectomy 3. Left oophorectomy (RA-laparoscopic) 4. Pelvic washings  Findings: Adhesions of omentum to anterior abdominal wall. Enlarged cystic right ovary ~10cm. Uterus had small nodules on serosa near where right adnexa was intimate with the surface. No obvious intraoperative rupture of cyst, although in 2 areas the wall was thin and one of these areas had some bleeding. Slight scarring of left bladder dome to LUS/cervix. Uterus on frozen section c/w hyperplasia and a small focus of endometrial CA - no myo invasion, <2cm in size. Frozen section on the right adnexa was carcinoma, met from colon or possibly Gyn, favor GI primary, defer to permanent. Left ovary was WNL.     09/15/2017 Cancer Staging   Staging form: Corpus Uteri - Carcinoma and Carcinosarcoma, AJCC 8th Edition - Pathologic: FIGO Stage IA (pT1a, pN0, cM0) - Signed by Heath Lark, MD on 09/15/2017   09/26/2017 Procedure   Successful placement of a right internal jugular approach power injectable Port-A-Cath. The catheter is ready for immediate use.    11/07/2017 Imaging   1. Since 08/18/2017, similar to slight decrease in size of  a pancreatic body/tail junction lesion. Cross modality comparison relative to 09/16/2017 MRI is also grossly similar. 2. No evidence of metastatic disease. 3. Aortic Atherosclerosis (ICD10-I70.0).  4. Left nephrolithiasis.   11/18/2017 Genetic Testing   MSH2 c.2237dupT pathogenic mutation identified in the CancerNext panel.  The CancerNext gene panel offered by [employer] includes sequencing and rearrangement analysis for the following 34 genes:   APC, ATM, BARD1, BMPR1A, BRCA1, BRCA2, BRIP1, CDH1, CDK4, CDKN2A, CHEK2, DICER1, HOXB13, EPCAM, GREM1, MLH1, MRE11A, MSH2, MSH6, MUTYH, NBN, NF1, PALB2, PMS2, POLD1, POLE, PTEN, RAD50, RAD51C, RAD51D, SMAD4, SMARCA4, STK11, and TP53.  The report date is November 18, 2017.  MSH2 [id number] pathogenic mutation identified on somatic testing.  These results are consistent with a diagnosis of Lynch syndrome.   06/18/2019 - 08/14/2019 Chemotherapy   The patient had FOLFIRINOX for chemotherapy treatment.     01/19/2020 -  Chemotherapy    Patient is on Treatment Plan: PANCREATIC ABRAXANE / GEMCITABINE D1,8,15 Q28D      04/11/2020 Imaging   1. Essentially stable appearance of the soft tissue density process along the distal pancreatectomy site, abutting the posterior portion of the stomach, celiac trunk, left lateral margin of the SMA, and potentially the superior margin of the left renal vein. This could be inflammatory but residual tumor not excluded. 2. Some of the liver lesions are mildly reduced in size and some of the liver lesions are stable in size. 3. Further reduction in omental nodularity. 4. Small paraumbilical hernia contains a loop of small bowel, without findings of strangulation or obstruction. 5. Small supraumbilical hernias containing adipose tissue. 6. Small nonobstructive left renal calculus.   Secondary malignant neoplasm of right ovary  ([place])  08/23/2017 Tumor Marker   Patient's tumor was tested for the following markers: CA-125 Results of the tumor marker test revealed 139.8   08/31/2017 Initial Diagnosis   Secondary malignant neoplasm of right ovary ([place])   09/15/2017 Cancer Staging   Staging form: Ovary, Fallopian Tube, and Primary Peritoneal Carcinoma, AJCC 8th Edition - Pathologic: Stage IIB (pT2b, pN0, cM0) - Signed by Heath Lark, MD on 09/15/2017   09/27/2017 Imaging   No evidence of metastatic disease or other acute findings within the thorax.  4 cm low-attenuation mass in pancreatic tail, highly suspicious for pancreatic carcinoma. This is caused splenic vein thrombosis, with new venous collaterals in the left upper quadrant. Consider endoscopic ultrasound with FNA for tissue diagnosis.  Stable benign hepatic hemangioma.    09/27/2017 Tumor Marker   Patient's tumor was tested for the following markers: CA-125 Results of the tumor marker test revealed 39.4   11/02/2017 Tumor Marker   Patient's tumor was tested for the following markers: CA-125 Results of the tumor marker test revealed 19   11/07/2017 Tumor Marker   Patient's tumor was tested for the following markers: CA-125 Results of the tumor marker test revealed 18.3   11/18/2017 Genetic Testing   MSH2 c.2237dupT pathogenic mutation identified in the CancerNext panel.  The CancerNext gene panel offered by [employer] includes sequencing and rearrangement analysis for the following 34 genes:   APC, ATM, BARD1, BMPR1A, BRCA1, BRCA2, BRIP1, CDH1, CDK4, CDKN2A, CHEK2, DICER1, HOXB13, EPCAM, GREM1, MLH1, MRE11A, MSH2, MSH6, MUTYH, NBN, NF1, PALB2, PMS2, POLD1, POLE, PTEN, RAD50, RAD51C, RAD51D, SMAD4, SMARCA4, STK11, and TP53.  The report date is November 18, 2017.  MSH2 [id number] pathogenic mutation identified on somatic testing.  These results are consistent with a diagnosis of Lynch syndrome.   12/15/2017 Tumor Marker  Patient's tumor was  tested for the following markers: CA-125 Results of the tumor marker test revealed 57.3   01/16/2018 Tumor Marker   Patient's tumor was tested for the following markers: CA-125 Results of the tumor marker test revealed 24.2   04/10/2018 Tumor Marker   Patient's tumor was tested for the following markers: CA-125 Results of the tumor marker test revealed 18.2   07/12/2018 Tumor Marker   Patient's tumor was tested for the following markers: CA-125 Results of the tumor marker test revealed 11.8   10/16/2018 Tumor Marker   Patient's tumor was tested for the following markers: CA125 Results of the tumor marker test revealed 12.4.   01/18/2020 Tumor Marker   Patient's tumor was tested for the following markers: CA-125 Results of the tumor marker test revealed 18   Malignant neoplasm of tail of pancreas ([place])  10/13/2017 Pathology Results   Pancreas tail mass, endoscopic ultrasound-guided, fine needle aspiration II (smears and cell block): Adenocarcinoma   11/18/2017 Genetic Testing   MSH2 c.2237dupT pathogenic mutation identified in the CancerNext panel.  The CancerNext gene panel offered by [employer] includes sequencing and rearrangement analysis for the following 34 genes:   APC, ATM, BARD1, BMPR1A, BRCA1, BRCA2, BRIP1, CDH1, CDK4, CDKN2A, CHEK2, DICER1, HOXB13, EPCAM, GREM1, MLH1, MRE11A, MSH2, MSH6, MUTYH, NBN, NF1, PALB2, PMS2, POLD1, POLE, PTEN, RAD50, RAD51C, RAD51D, SMAD4, SMARCA4, STK11, and TP53.  The report date is November 18, 2017.  MSH2 [id number] pathogenic mutation identified on somatic testing.  These results are consistent with a diagnosis of Lynch syndrome.   11/24/2017 Pathology Results   A. "TAIL OF PANCREAS AND SPLEEN", DISTAL PANCREATECTOMY AND SPLENECTOMY: Invasive ductal adenocarcinoma, moderately to poorly differentiated with focal signet ring cell features, of pancreas (distal).   The carcinoma is 2.5 cm in greatest dimension  grossly.   Treatment effects present in the form of fibrosis (50%). No lymphovascular or definite perineural invasion identified. All surgical margins are negative for tumor or high-grade dysplasia. Adjacent mucinous neoplasm, most consistent with Intraductal papillary mucinous neoplasm (IPMN) with low-grade dysplasia.   Uninvolved pancreas show atrophy and focal acute inflammation.   Twelve benign lymph nodes (0/12). Spleen with no significant histopathologic abnormalities.  PROCEDURE: distal pancreatectomy and splenectomy TUMOR SITE: distal pancreas TUMOR SIZE:  GREATEST DIMENSION: 2.5 cm  ADDITIONAL DIMENSIONS:  x  cm HISTOLOGIC TYPE: ductal adenocarcinoma HISTOLOGIC GRADE: grade 3 TUMOR EXTENSION: peripancreatic soft tissue MARGINS: negative for tumor TREATMENT EFFECT: treatment effects present in the form of fibrosis (50%). LYMPHOVASCULAR INVASION: not identified PERINEURAL INVASION: no definite evidence REGIONAL LYMPH NODES:   NUMBER OF LYMPH NODES INVOLVED: 0   NUMBER OF LYMPH NODES EXAMINED: 12 PATHOLOGIC STAGE CLASSIFICATION (pTNM, AJCC 8th Ed): ypT2, ypN0 DISTANT METASTASIS (pM): pMx ADDITIONAL PATHOLOGIC FINDINGS: mucinous neoplasm, most consistent with intraductal papillary mucinous neoplasm (IPMN) with low-grade dysplasia, is identified adjacent to the main tumor.   11/24/2017 Cancer Staging   Staging form: Exocrine Pancreas, AJCC 8th Edition - Pathologic stage from 11/24/2017: Stage IB (pT2, pN0, cM0) - Signed by Truitt Merle, MD on 01/03/2018   11/25/2017 Surgery   She had surgery at [facility] 1. Exploratory Laparotomy 2. Distal Pancreatectomy and Splenectomy 3. Intraoperative Ultrasound 4. Open Cholecystectomy    12/15/2017 Cancer Staging   Staging form: Exocrine Pancreas, AJCC 8th Edition - Clinical: Stage IB (cT2, cN0, cM0) - Signed by Heath Lark, MD on 12/15/2017   12/28/2017 Imaging   12/28/2017 CT  Abdomen  IMPRESSION: 1. Postoperative findings from recent partial pancreatectomy  including a 21 cubic cm fluid collection along the pancreatic resection margin which could represent early pseudocyst. 2. Nodularity along the lateral limb of the left adrenal gland could also be postoperative but merit surveillance, as the pancreatic lesion was in close proximity to this adrenal gland on the prior CT of 11/07/2017. 3. Asymmetric fullness inferiorly in the left breast. The patient has a history of prior left breast procedures, correlation with mammography is recommended. 4. Other imaging findings of potential clinical significance: Stable hemangioma in the left hepatic lobe. Splenectomy. Aortic Atherosclerosis (ICD10-I70.0). Right hemicolectomy. Small focus of fat necrosis in the right anterior abdominal wall subcutaneous tissues near the laparotomy site. Bilateral nonobstructive nephrolithiasis.   01/02/2018 Tumor Marker   Patient's tumor was tested for the following markers: CA-19-9 Results of the tumor marker test revealed 5   01/03/2018 - 06/05/2018 Chemotherapy   She received modified dose FOLFIRINOX   04/10/2018 Imaging   1. Distal pancreatectomy, without findings of recurrent or metastatic disease. 2. Incompletely imaged hypoenhancement within lower lobe right pulmonary artery branch is likely chronic (but interval since 10/09/2017) pulmonary embolism. Dedicated CTA could further evaluate. 3. Decrease in size of a peripancreatic complex fluid collection anteriorly, likely a resolving pseudocyst. 4. Decreased size of minimal fluid versus a borderline sized node in the gastrohepatic ligament. Recommend attention on follow-up. 5. Hepatic steatosis with a segment 4 hemangioma. 6. Aortic Atherosclerosis (ICD10-I70.0). This is significantly age advanced.   07/12/2018 Tumor Marker   Patient's tumor was tested for the following markers: CA-19-9 Results of the tumor marker test revealed 6    07/12/2018 Imaging   1. Status post distal pancreatectomy with stable postoperative fluid collection adjacent to the ventral aspect of the pancreatic head. No findings to suggest metastatic disease in the abdomen or pelvis. 2. Hepatic steatosis with small cavernous hemangioma in segment 4A of the liver. 3. Slight decreased size of nonenlarged gastrohepatic ligament lymph node, presumably benign. 4. Aortic atherosclerosis.    10/16/2018 Imaging   Status post distal pancreatectomy. Stable postoperative seroma along the surgical margin.   Status post hysterectomy and right oophorectomy.   Status post right hemicolectomy with appendectomy.   No evidence of recurrent or metastatic disease.   No colonic wall thickening or mass is evident on CT.   01/22/2019 Tumor Marker   Patient's tumor was tested for the following markers: CA-19.9 Results of the tumor marker test revealed 5   01/22/2019 Imaging   1. No evidence of local recurrence or metastatic disease status post distal pancreatectomy and splenectomy. 2. Stable small seroma anterior to the pancreatic head. 3. Postsurgical changes as described. 4. Stable additional incidental findings including a hepatic hemangioma, nonobstructing bilateral renal calculi and aortic Atherosclerosis (ICD10-I70.0).   04/18/2019 Imaging   1. Status post distal pancreatectomy and splenectomy. There has been a gradual increase in ill-defined soft tissue and celiac axis/SMA origin lymph nodes adjacent to the distal pancreatectomy margin and lesser curvature of the stomach/gastric body over sequential prior examinations dated 01/22/2019 and 09/26/2018. An enlarged lymph node or soft tissue nodule adjacent to the SMA origin now measures 1.8 x 0.8 cm. Findings are concerning for local recurrence of pancreatic malignancy. 2. Separately from the above findings, there remains a 1.0 cm low-attenuation nodule anterior to the remnant pancreatic neck, most consistent with  postoperative seroma 3. No evidence of distant metastatic disease in the chest, abdomen, or pelvis. 4. Postoperative findings of cholecystectomy, right hemicolectomy, and hysterectomy. 5. Bilateral nonobstructive nephrolithiasis. 6.  Aortic Atherosclerosis (ICD10-I70.0).  05/28/2019 Tumor Marker   Patient's tumor was tested for the following markers: CA19-9 Results of the tumor marker test revealed 18   05/28/2019 Imaging   1. Status post distal pancreatectomy with continued further progression of the ill-defined soft tissue to the left of the celiac axis/SMA origin, now measuring 1.9 x 1.9 cm and highly concerning for recurrent/metastatic disease. This process abuts both the celiac axis, SMA, and posterior wall of the mid stomach. PET-CT may prove helpful to further evaluate. 2. Bandlike ill-defined soft tissue in the anterior abdomen, potentially peritoneal or omental is similar to perhaps minimally increased qualitatively in the interval. Close attention on follow-up recommended. 3. Left paraumbilical hernia contains a short segment of small bowel without complicating features. 4. Ill-defined very subtle area of decreased attenuation in the posterior aspect of hepatic segment IV. This is likely focal fatty deposition. Close attention on follow-up recommended. 5.  Aortic Atherosclerois (ICD10-170.0)   06/07/2019 Pathology Results   Malignant cells consistent with adenocarcinoma   06/07/2019 Procedure   ENDOSCOPIC FINDING (limited views with linear echoendoscope): :      The examined esophagus was endoscopically normal.      The entire examined stomach was endoscopically normal.      ENDOSONOGRAPHIC FINDING: :      1. An irregular mass was identified in the region of the pancreatic tail resection site. The mass was stellate with very poorly defined borders. Fine needle aspiration for cytology was performed. Color Doppler imaging was utilized prior to needle puncture to confirm a lack of  significant vascular structures within the needle path. Four passes were made with the 25 gauge (FNB) needle using a transgastric approach. A cytotechnologist was present to evaluate the adequacy of the specimen. Final cytology results are pending. Impression:               -  Irregular, stellate soft tissue mass in the region of the pancreatic tail resection site. This was sampled with four transgastric passes with an EUS FNB needle..   06/18/2019 - 08/14/2019 Chemotherapy   The patient had FOLFIRINOX for chemotherapy treatment.     09/03/2019 Imaging   1. Findings are again highly concerning for locally recurrent disease adjacent to the pancreatectomy bed, with enlarging soft tissue mass which is intimately associated with the superior mesenteric artery, celiac axis and superior aspect of the left renal  vein, as detailed above. 2. No evidence of metastatic disease in the thorax. 3. 2.7 x 1.2 cm cavernous hemangioma in segment 4A of the liver again noted. 4. Small nonobstructive calculi in the collecting systems of both kidneys measuring up to 3 mm in the lower pole collecting system of the left kidney. 5. Aortic atherosclerosis. 6. Additional incidental findings, as above.   11/16/2019 Imaging   Similar-appearing ill-defined soft tissue the left of the celiac axis/SMA, most compatible with recurrent/metastatic disease.   Similar ill-defined nodularity within the anterior upper abdomen, potentially peritoneal or omental. Continued attention on follow-up is recommended.   12/31/2019 Tumor Marker   Patient's tumor was tested for the following markers: CA-19-9 Results of the tumor marker test revealed 298   01/10/2020 Imaging   1. Numerous small hypoenhancing liver masses scattered throughout the liver, all new since 11/16/2019 CT, compatible with new liver metastases. 2. Stable upper retroperitoneal mass encasing the celiac axis and abutting the pancreatic resection margin, compatible with  stable locally recurrent tumor. 3. Stable subcentimeter indistinct anterior upper peritoneal implants. 4. No evidence of metastatic disease in the  chest. 5. Chronic findings include: Nonobstructing left nephrolithiasis. Stable small left paraumbilical hernia containing a small bowel loop without acute bowel complication. Aortic Atherosclerosis (ICD10-I70.0).     01/19/2020 -  Chemotherapy    Patient is on Treatment Plan: PANCREATIC ABRAXANE / GEMCITABINE D1,8,15 Q28D      01/25/2020 Tumor Marker   Patient's tumor was tested for the following markers: CA-19-9 Results of the tumor marker test revealed 1104.   03/04/2020 Tumor Marker   Patient's tumor was tested for the following markers: CA-19-9 Results of the tumor marker test revealed 581   04/11/2020 Imaging   1. Essentially stable appearance of the soft tissue density process along the distal pancreatectomy site, abutting the posterior portion of the stomach, celiac trunk, left lateral margin of the SMA, and potentially the superior margin of the left renal vein. This could be inflammatory but residual tumor not excluded. 2. Some of the liver lesions are mildly reduced in size and some of the liver lesions are stable in size. 3. Further reduction in omental nodularity. 4. Small paraumbilical hernia contains a loop of small bowel, without findings of strangulation or obstruction. 5. Small supraumbilical hernias containing adipose tissue. 6. Small nonobstructive left renal calculus.   04/11/2020 Tumor Marker   Patient's tumor was tested for the following markers: CA19-9 Results of the tumor marker test revealed 451   05/27/2020 Imaging   1. Stable chest CT, without acute findings or evidence of metastatic disease. 2. Assessment of the patient's liver disease is limited by progressive geographic hepatic steatosis. There is one central hepatic lesion which appears slightly more conspicuous, and this may be contributing to new intrahepatic  biliary dilatation posteriorly in the right lobe. The other hepatic lesions are generally improved. No extrahepatic biliary dilatation. 3. 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 No apparent residual peritoneal disease. 4. Stable postsurgical changes and small hernias of the anterior abdominal wall.   05/28/2020 Tumor Marker   Patient's tumor was tested for the following markers: CA19-9 Results of the tumor marker test revealed [id number]     REVIEW OF SYSTEMS:   Constitutional: Denies fevers, chills or abnormal weight loss Eyes: Denies blurriness of vision Ears, nose, mouth, throat, and face: Denies mucositis or sore throat Respiratory: Denies cough, dyspnea or wheezes Cardiovascular: Denies palpitation, chest discomfort or lower extremity swelling Gastrointestinal:  Denies nausea, heartburn or change in bowel habits Skin: Denies abnormal skin rashes Lymphatics: Denies new lymphadenopathy or easy bruising Neurological:Denies numbness, tingling or new weaknesses Behavioral/Psych: Mood is stable, no new changes  All other systems were reviewed with the patient and are negative.  I have reviewed the past medical history, past surgical history, social history and family history with the patient and they are unchanged from previous note.  ALLERGIES:  is allergic to codeine, other, penicillins, and bactrim [sulfamethoxazole-trimethoprim].  MEDICATIONS:  Current Outpatient Medications  Medication Sig Dispense Refill  . acetaminophen (TYLENOL 8 HOUR ARTHRITIS PAIN) 650 MG CR tablet 1 tablet    . cetirizine (ZYRTEC) 10 MG tablet Take 10 mg by mouth daily.    . citalopram (CELEXA) 20 MG tablet TAKE 1 TABLET BY MOUTH EVERY DAY 90 tablet 4  . CREON 24000-76000 units CPEP Take 1 capsule by mouth See admin instructions. Take 1 capsule before each meal and 1 capsule after each meal, take 1 cap with snacks    . diclofenac Sodium (VOLTAREN) 1  % GEL APPLY 2-4 GRAMS TO AFFECTED JOINT  4 TIMES DAILY AS NEEDED. 400 g 1  . estradiol (ESTRACE VAGINAL) 0.1 MG/GM vaginal cream Place 1 Applicatorful vaginally 3 (three) times a week. 42.5 g 12  . fluticasone (FLONASE) 50 MCG/ACT nasal spray Place 1 spray into both nostrils daily.    Marland Kitchen gabapentin (NEURONTIN) 300 MG capsule Take 1 capsule (300 mg total) by mouth 3 (three) times daily. 90 capsule 11  . HYDROmorphone (DILAUDID) 4 MG tablet Take 1 tablet (4 mg total) by mouth every 4 (four) hours as needed. 60 tablet 0  . hydrOXYzine (ATARAX/VISTARIL) 10 MG tablet Take 1 tablet (10 mg total) by mouth 3 (three) times daily as needed. 30 tablet 1  . lidocaine-prilocaine (EMLA) cream Apply 1 application topically daily as needed (port). Apply to affected area once 30 g 11  . LORazepam (ATIVAN) 0.5 MG tablet Take 1 tablet (0.5 mg total) by mouth every 8 (eight) hours as needed for anxiety (nausea). 30 tablet 0  . metFORMIN (GLUCOPHAGE) 500 MG tablet Take 2 tablets (1,000 mg total) by mouth 2 (two) times daily with a meal. TAKE 1 TABLET BY MOUTH TWICE A DAY WITH A MEAL 180 tablet 11  . methocarbamol (ROBAXIN) 500 MG tablet TAKE 1 TABLET BY MOUTH THREE TIMES A DAY AS NEEDED FOR MUSCLE SPASMS 90 tablet 0  . Multiple Vitamin (MULTIVITAMIN WITH MINERALS) TABS tablet Take 1 tablet by mouth daily.    . naproxen (NAPROSYN) 500 MG tablet Take 1 tablet (500 mg total) by mouth 2 (two) times daily with a meal. 30 tablet 0  . omeprazole (PRILOSEC) 40 MG capsule TAKE 1 CAPSULE BY MOUTH EVERY DAY 90 capsule 3  . ondansetron (ZOFRAN) 8 MG tablet TAKE 1 TAB BY MOUTH EVERY 8HOURS AS NEEDED FOR REFRACTORY NAUSEA/VOMITING START ON DAY 3AFTER CHEMO 8 tablet 7  . predniSONE (DELTASONE) 5 MG tablet 2 tablets    . prochlorperazine (COMPAZINE) 10 MG tablet TAKE 1 TABLET BY MOUTH EVERY 6 HOURS AS NEEDED 90 tablet 3  . rivaroxaban (XARELTO) 20 MG TABS tablet Take 1 tablet (20 mg total) by mouth daily with supper. 30 tablet 9  .  simethicone (MYLICON) 161 MG chewable tablet Chew 125 mg by mouth every 6 (six) hours as needed for flatulence.     No current facility-administered medications for this visit.   Facility-Administered Medications Ordered in Other Visits  Medication Dose Route Frequency Provider Last Rate Last Admin  . gemcitabine (GEMZAR) 1,292 mg in sodium chloride 0.9 % 250 mL chemo infusion  600 mg/m2 (Treatment Plan Recorded) Intravenous Once Alvy Bimler, Maddy Graham, MD      . heparin lock flush 100 unit/mL  500 Units Intracatheter Once PRN Alvy Bimler, Breigh Annett, MD      . sodium chloride flush (NS) 0.9 % injection 10 mL  10 mL Intracatheter PRN Alvy Bimler, Ziair Penson, MD        PHYSICAL EXAMINATION: ECOG PERFORMANCE STATUS: 1 - Symptomatic but completely ambulatory  Vitals:   06/24/20 1023  BP: 101/76  Pulse: 95  Resp: 18  Temp: 99.1 F (37.3 C)  SpO2: 98%   Filed Weights   06/24/20 1023  Weight: 229 lb 3.2 oz (104 kg)    GENERAL:alert, no distress and comfortable SKIN: skin color, texture, turgor are normal, no rashes or significant lesions EYES: normal, Conjunctiva are pink and non-injected, sclera clear OROPHARYNX:no exudate, no erythema and lips, buccal mucosa, and tongue normal  NECK: supple, thyroid normal size, non-tender, without nodularity LYMPH:  no palpable lymphadenopathy in the cervical, axillary or  inguinal LUNGS: clear to auscultation and percussion with normal breathing effort HEART: regular rate & rhythm and no murmurs and no lower extremity edema ABDOMEN:abdomen soft, non-tender and normal bowel sounds Musculoskeletal:no cyanosis of digits and no clubbing  NEURO: alert & oriented x 3 with fluent speech, no focal motor/sensory deficits  LABORATORY DATA:  I have reviewed the data as listed    Component Value Date/Time   NA 138 06/24/2020 0950   K 4.2 06/24/2020 0950   CL 102 06/24/2020 0950   CO2 27 06/24/2020 0950   GLUCOSE 197 (H) 06/24/2020 0950   BUN 9 06/24/2020 0950   CREATININE 0.70  06/24/2020 0950   CREATININE 0.68 07/27/2017 0851   CALCIUM 8.8 (L) 06/24/2020 0950   PROT 6.1 (L) 06/24/2020 0950   ALBUMIN 3.0 (L) 06/24/2020 0950   AST 68 (H) 06/24/2020 0950   ALT 79 (H) 06/24/2020 0950   ALKPHOS 227 (H) 06/24/2020 0950   BILITOT 0.4 06/24/2020 0950   GFRNONAA >60 06/24/2020 0950   GFRNONAA 106 07/27/2017 0851   GFRAA >60 11/09/2019 1012   GFRAA >60 09/27/2019 1535   GFRAA 123 07/27/2017 0851    No results found for: SPEP, UPEP  Lab Results  Component Value Date   WBC 8.3 06/24/2020   NEUTROABS 3.6 06/24/2020   HGB 11.7 (L) 06/24/2020   HCT 36.4 06/24/2020   MCV 94.3 06/24/2020   PLT 521 (H) 06/24/2020      Chemistry      Component Value Date/Time   NA 138 06/24/2020 0950   K 4.2 06/24/2020 0950   CL 102 06/24/2020 0950   CO2 27 06/24/2020 0950   BUN 9 06/24/2020 0950   CREATININE 0.70 06/24/2020 0950   CREATININE 0.68 07/27/2017 0851      Component Value Date/Time   CALCIUM 8.8 (L) 06/24/2020 0950   ALKPHOS 227 (H) 06/24/2020 0950   AST 68 (H) 06/24/2020 0950   ALT 79 (H) 06/24/2020 0950   BILITOT 0.4 06/24/2020 0950       RADIOGRAPHIC STUDIES: I have personally reviewed the radiological images as listed and agreed with the findings in the report. CT CHEST ABDOMEN PELVIS W CONTRAST  Result Date: 06/03/2020 CLINICAL DATA:  Elevated liver function studies. History of multiple malignancies, including reported pancreatic cancer, colon cancer and ovarian/uterine cancer. Per previous radiology reports history of pancreatic resection margin recurrence 06/07/2019 with subsequent chemotherapy. Assess response to chemotherapy. EXAM: CT CHEST, ABDOMEN, AND PELVIS WITH CONTRAST TECHNIQUE: Multidetector CT imaging of the chest, abdomen and pelvis was performed following the standard protocol during bolus administration of intravenous contrast. CONTRAST:  153m OMNIPAQUE IOHEXOL 300 MG/ML  SOLN COMPARISON:  Prior CTs 04/11/2020 and 01/10/2020. FINDINGS: CT  CHEST FINDINGS Cardiovascular: Right IJ Port-A-Cath extends to the level of the upper right atrium. No acute vascular findings. The heart size is normal. There is no pericardial effusion. Mediastinum/Nodes: There are no enlarged mediastinal, hilar or axillary lymph nodes. The thyroid gland, trachea and esophagus demonstrate no significant findings. Lungs/Pleura: There is no pleural effusion or pneumothorax. The lungs are clear, without suspicious nodularity. Musculoskeletal/Chest wall: No chest wall mass or suspicious osseous findings. CT ABDOMEN AND PELVIS FINDINGS Hepatobiliary: Previously demonstrated hepatic steatosis is now more heterogeneous with a geographic distribution. This makes identification of the underlying liver lesions more challenging. However, most of the lesions are less conspicuous and appear improved. A lesion superior to the cholecystectomy bed is slightly more prominent, measuring up to 9 mm on image 83/7. A 6  mm lesion laterally in the caudate lobe (image 44/2) is not significantly changed. Previously characterized hemangioma measuring 2.6 x 1.6 cm in segment 4A (image 71/7) is unchanged. There is new intrahepatic biliary dilatation posteriorly in the right hepatic lobe without clear etiology. No significant extrahepatic biliary dilatation post cholecystectomy. Pancreas: Stable appearance of the pancreatic head status post distal pancreatectomy. There is no pancreatic ductal dilatation. Soft tissue mass along the superior aspect of the pancreatectomy margin and lateral to the superior mesenteric artery is unchanged, measuring 2.8 x 1.6 cm on image 99/7. Spleen: Status post splenectomy. Adrenals/Urinary Tract: Both adrenal glands appear normal. There is a small nonobstructing calculus in the lower pole of the left kidney. Both kidneys otherwise appear normal. No evidence of ureteral calculus or hydronephrosis. The bladder appears normal. Stomach/Bowel: Enteric contrast was administered and  has passed into the distal small bowel. The stomach appears unremarkable for its degree of distension. No evidence of bowel wall thickening, distention or surrounding inflammatory change. Stable postsurgical changes status post right hemicolectomy. Vascular/Lymphatic: There are no enlarged abdominal or pelvic lymph nodes. Minimal aortic and branch vessel atherosclerosis without acute vascular findings. The portal, superior mesenteric and splenic veins are patent. Reproductive: Hysterectomy. No adnexal mass. Probable mild pelvic floor laxity. Other: Postsurgical changes in the anterior abdominal wall with stable small supraumbilical hernias containing fat and small bowel. No evidence of incarceration or obstruction. No ascites or definite residual peritoneal nodularity. Musculoskeletal: No acute or significant osseous findings. IMPRESSION: 1. Stable chest CT, without acute findings or evidence of metastatic disease. 2. Assessment of the patient's liver disease is limited by progressive geographic hepatic steatosis. There is one central hepatic lesion which appears slightly more conspicuous, and this may be contributing to new intrahepatic biliary dilatation posteriorly in the right lobe. The other hepatic lesions are generally improved. No extrahepatic biliary dilatation. 3. 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 No apparent residual peritoneal disease. 4. Stable postsurgical changes and small hernias of the anterior abdominal wall. Electronically Signed   By: Richardean Sale M.D.   On: 06/03/2020 13:30

## 2020-06-24 NOTE — Progress Notes (Signed)
Ok to treat per Dr. Alvy Bimler with elevated LFT

## 2020-06-24 NOTE — Assessment & Plan Note (Signed)
Per previous discussion, we will continue on maintenance gemcitabine every 2 weeks, with dose reduction due to abnormal liver enzymes Her counts are stable Apart from some joint pain which is unrelated, she tolerated treatment very well For now, we will continue treatment indefinitely Plan to repeat imaging study again in July

## 2020-06-25 ENCOUNTER — Telehealth: Payer: Self-pay

## 2020-06-25 LAB — CANCER ANTIGEN 19-9: CA 19-9: 4304 U/mL — ABNORMAL HIGH (ref 0–35)

## 2020-06-25 LAB — CA 125: Cancer Antigen (CA) 125: 46.1 U/mL — ABNORMAL HIGH (ref 0.0–38.1)

## 2020-06-25 NOTE — Telephone Encounter (Signed)
Pt notified, verbalized understanding.  No further needs at this time.

## 2020-06-25 NOTE — Telephone Encounter (Signed)
-----   Message from Heath Lark, MD sent at 06/25/2020  7:52 AM EDT ----- Pls let her know thyroid test was ok Her tumor markers are high because of her elevated LFT Monitor closely for now

## 2020-06-26 ENCOUNTER — Other Ambulatory Visit: Payer: Self-pay | Admitting: Hematology and Oncology

## 2020-06-26 ENCOUNTER — Other Ambulatory Visit: Payer: Self-pay | Admitting: Rheumatology

## 2020-06-26 NOTE — Telephone Encounter (Signed)
Next Visit: message sent to front desk to schedule appt, Return in about 3 months (around 01/31/2020) for Rheumatoid arthritis, Osteoarthritis.   Last Visit: 11/01/2019  Last Fill:   Dx: Rheumatoid arthritis of multiple sites with negative rheumatoid factor   Current Dose per office note on 11/01/2019, prednisone 10 mg p.o. daily  Okay to refill prednisone?

## 2020-06-26 NOTE — Telephone Encounter (Signed)
Follow up scheduled

## 2020-06-26 NOTE — Telephone Encounter (Signed)
Please schedule appt, thank you.    Return in about 3 months (around 01/31/2020) for Rheumatoid arthritis, Osteoarthritis.

## 2020-06-30 ENCOUNTER — Telehealth: Payer: Self-pay | Admitting: Hematology and Oncology

## 2020-06-30 NOTE — Telephone Encounter (Signed)
Sch per 5/3 los, pt aware 

## 2020-06-30 NOTE — Progress Notes (Signed)
Office Visit Note  Patient: Dana Hicks             Date of Birth: 13-Sep-1975           MRN: [id number]             PCP: [name], MD Referring: [name], MD Visit Date: 07/01/2020 Occupation: @GUAROCC @  Subjective:  Pain in joints.   History of Present Illness: Dana Hicks is a 45 y.o. female with a history of rheumatoid arthritis.  She has been off immunosuppressive agents for rheumatoid arthritis for a while.  She has been taking prednisone 5 mg p.o. daily for control of her joint symptoms.  She is receiving chemotherapy for pancreatic cancer.  She is also finished radiation therapy.  She has been having increased discomfort in her right elbow joint and some discomfort in the right knee joint.  Left knee is partially replaced and has been doing well.  Activities of Daily Living:  Patient reports morning stiffness for several  hours.   Patient Reports nocturnal pain.  Difficulty dressing/grooming: Reports Difficulty climbing stairs: Reports Difficulty getting out of chair: Reports Difficulty using hands for taps, buttons, cutlery, and/or writing: Reports  Review of Systems  Constitutional: Positive for fatigue.  HENT: Negative for mouth sores, mouth dryness and nose dryness.   Eyes: Negative for pain, itching and dryness.  Respiratory: Positive for shortness of breath. Negative for difficulty breathing.   Cardiovascular: Negative for chest pain and palpitations.  Gastrointestinal: Positive for constipation. Negative for blood in stool and diarrhea.  Endocrine: Negative for increased urination.  Genitourinary: Negative for difficulty urinating.  Musculoskeletal: Positive for arthralgias, joint pain, myalgias, morning stiffness, muscle tenderness and myalgias. Negative for joint swelling.  Skin: Negative for color change, rash and redness.  Allergic/Immunologic: Negative for susceptible to infections.  Neurological: Positive for numbness and memory loss.  Negative for dizziness, headaches and weakness.  Hematological: Positive for bruising/bleeding tendency.  Psychiatric/Behavioral: Negative for confusion.    PMFS History:  Patient Active Problem List   Diagnosis Date Noted  . Other fatigue 06/24/2020  . Acute maxillary sinusitis 06/24/2020  . Constipation 06/24/2020  . GERD (gastroesophageal reflux disease) 06/24/2020  . Hyperglycemia 06/24/2020  . Left upper quadrant pain 06/24/2020  . Mixed anxiety and depressive disorder 06/24/2020  . Transfusion history 06/24/2020  . Dyspnea on exertion 06/03/2020  . Oral thrush 05/16/2020  . Acquired hypogammaglobulinemia ([place]) 02/26/2020  . Scalp itch 02/26/2020  . Fever 01/28/2020  . Mucositis due to chemotherapy 01/25/2020  . Chronic midline thoracic back pain 07/09/2019  . Cancer associated pain 07/02/2019  . Goals of care, counseling/discussion 06/11/2019  . Weight loss 05/29/2019  . Abdominal pain 04/11/2019  . Pancreatitis, acute 04/09/2019  . Vaginal dryness, menopausal 01/23/2019  . S/P left unicompartmental knee replacement 11/27/2018  . UTI (urinary tract infection) 06/29/2018  . Pulmonary embolism ([place]) 04/10/2018  . Diarrhea due to drug 03/28/2018  . Steroid-induced diabetes ([place]) 03/14/2018  . Neutropenic fever ([place]) 02/14/2018  . Acute rhinitis 02/14/2018  . Tachycardia 02/14/2018  . Elevated liver enzymes 02/14/2018  . Physical debility 02/07/2018  . Chemotherapy-induced nausea 02/07/2018  . Hot flashes due to surgical menopause 02/06/2018  . Anemia due to antineoplastic chemotherapy 12/15/2017  . Hypomagnesemia 12/15/2017  . Iron deficiency anemia 12/15/2017  . S/P splenectomy 12/15/2017  . Family history of colon cancer 12/04/2017  . Lynch syndrome 11/22/2017  . Bilateral chronic knee pain 11/21/2017  . Genetic testing 11/18/2017  .  Calculus of gallbladder without cholecystitis without obstruction 11/10/2017  . Inflammatory arthritis 11/08/2017  . Dysuria  11/02/2017  . Peripheral neuropathy due to chemotherapy ([place]) 10/22/2017  . Bone pain 10/21/2017  . Malignant neoplasm of tail of pancreas ([place]) 10/17/2017  . Family history of uterine cancer 10/17/2017  . Sinusitis, chronic 09/15/2017  . Malabsorption due to disorder of pancreas 09/15/2017  . Primary malignant neoplasm of endometrium ([place]) 08/31/2017  . Secondary malignant neoplasm of right ovary ([place]) 08/31/2017  . Pustular folliculitis 62/70/3500  . MRSA colonization 05/12/2017  . Dacrocystitis, left 05/09/2017  . Perennial allergic rhinitis 05/09/2017  . Seasonal allergies 03/29/2017  . Left foot pain 01/10/2017  . Osteoarthritis of both knees 12/01/2016  . Class 3 severe obesity due to excess calories without serious comorbidity with body mass index (BMI) of 40.0 to 44.9 in adult ([place]) 10/01/2016  . High risk medication use 02/06/2016  . Family history of rheumatoid arthritis 02/06/2016  . H/O seasonal allergies 02/06/2016  . Rheumatoid arthritis of multiple sites with negative rheumatoid factor ([place]) 02/03/2016  . History of malignant neoplasm of colon 02/03/2016  . HLA B27 (HLA B27 positive) 02/03/2016  . Malignant neoplasm of colon, unspecified ([place]) 02/23/1999    Past Medical History:  Diagnosis Date  . Allergic rhinitis, seasonal   . Cecal cancer ([place]) 2001   Stage II (T3N0)  04-11-2001cecum-partial colectomy and completed chemo 2002  . Depression   . Diabetes mellitus without complication ([place])   . Endometrial cancer ([place]) 08/2017   Stage IA, Grade 1  . GERD (gastroesophageal reflux disease)   . History of MRSA infection 01/2017   followed by infectious disease center--  recurrent pustular folliculitis  . Nephrolithiasis    bilateral nonobstructive calculi per CT 08-18-2017  . OA (osteoarthritis)   . Ovarian cancer, right ([place]) 08/2017   Stage II Grade 2 Endometrioid  . Pancreatic cancer ([place])   . Rheumatoid arthritis [facility])    rheumatologist-  dr devenswar--  treated w/ oral prednisone daily and methotrexate injection every 3 wks    Family History  Problem Relation Age of Onset  . Arthritis/Rheumatoid Mother   . Uterine cancer Mother 107  . Allergic rhinitis Father   . Heart disease Father   . Drug abuse Son   . Other Maternal Aunt        Unknown GYN CA  . Colon cancer Maternal Uncle 49  . Rectal cancer Maternal Uncle   . Diabetes Maternal Grandmother   . Cancer Maternal Grandfather        d. 65s of liver/lung cancer  . Angioedema Neg Hx   . Asthma Neg Hx   . Atopy Neg Hx   . Eczema Neg Hx   . Immunodeficiency Neg Hx   . Urticaria Neg Hx   . Esophageal cancer Neg Hx   . Stomach cancer Neg Hx    Past Surgical History:  Procedure Laterality Date  . BREAST LUMPECTOMY WITH RADIOACTIVE SEED LOCALIZATION Left 06/28/2014   Benign Procedure: RADIOACTIVE SEED LOCALIZATION LEFT BREAST LUMPECTOMY;  Surgeon: Excell Seltzer, MD;  Location: [facility];  Service: General;  Laterality: Left;  . COLONOSCOPY  06/19/14  . ESOPHAGOGASTRODUODENOSCOPY (EGD) WITH PROPOFOL N/A 06/07/2019   Procedure: ESOPHAGOGASTRODUODENOSCOPY (EGD) WITH PROPOFOL;  Surgeon: [name], MD;  [facility];  Service: Endoscopy;  Laterality: N/A;  . EUS N/A 06/07/2019   Procedure: UPPER ENDOSCOPIC ULTRASOUND (EUS) LINEAR;  Surgeon: [name], MD;  [facility];  Service:  Endoscopy;  Laterality: N/A;  . EYE SURGERY Left    plug in tear duct  . FINE NEEDLE ASPIRATION N/A 06/07/2019   Procedure: FINE NEEDLE ASPIRATION (FNA) LINEAR;  Surgeon: [name], MD;  [facility];  Service: Endoscopy;  Laterality: N/A;  . IR IMAGING GUIDED PORT INSERTION  09/26/2017  . KNEE ARTHROSCOPY    . PANCREATECTOMY  11/25/2017  . PARTIAL KNEE ARTHROPLASTY Left 11/27/2018   Procedure: UNICOMPARTMENTAL KNEE;  Surgeon: Vickey Huger, MD;  [facility];  Service: Orthopedics;  Laterality: Left;  . PORT A CATH REVISION  2001   in and out   . RIGHT COLECTOMY  06/02/2009   cecum cancer  . ROBOTIC ASSISTED TOTAL HYSTERECTOMY WITH BILATERAL SALPINGO OOPHERECTOMY Bilateral 08/30/2017   Procedure: XI ROBOTIC ASSISTED TOTAL  HYSTERECTOMY BILATERAL  SALPINGO OOPHORECTOMY; LYSIS OF ADHESIONS;  Surgeon: Isabel Caprice, MD;  [facility];  Service: Gynecology;  Laterality: Bilateral;  . SPLENECTOMY, PARTIAL  11/25/2017   Social History   Social History Narrative  . Not on file   Immunization History  Administered Date(s) Administered  . H1N1 04/19/2008  . HiB (PRP-OMP) 12/04/2017  . HiB (PRP-T) 12/04/2017  . Influenza Split 02/18/2017, 12/04/2017  . Influenza,inj,Quad PF,6+ Mos 02/18/2017, 12/04/2017, 11/02/2018, 12/26/2019  . Meningococcal B Recombinant 12/04/2017  . Meningococcal Conjugate 12/04/2017  . Moderna Sars-Covid-2 Vaccination 05/12/2019, 06/12/2019, 12/14/2019  . Pneumococcal Conjugate-13 12/04/2017     Objective: Vital Signs: BP 128/70 (BP Location: Left Arm, Patient Position: Sitting, Cuff Size: Large)   Pulse 74   Resp 15   Ht $R'5\' 2"'qv$  (1.575 m)   Wt 230 lb 9.6 oz (104.6 kg)   LMP 08/30/2017   BMI 42.18 kg/m    Physical Exam Vitals and nursing note reviewed.  Constitutional:      Appearance: She is well-developed.  HENT:     Head: Normocephalic and atraumatic.  Eyes:     Conjunctiva/sclera: Conjunctivae normal.  Cardiovascular:     Rate and Rhythm: Normal rate and regular rhythm.     Heart sounds: Normal heart sounds.  Pulmonary:     Effort: Pulmonary effort is normal.     Breath sounds: Normal breath sounds.  Abdominal:     General: Bowel sounds are normal.     Palpations: Abdomen is soft.  Musculoskeletal:     Cervical back: Normal range of motion.  Lymphadenopathy:     Cervical: No cervical adenopathy.  Skin:    General: Skin is warm and dry.     Capillary Refill: Capillary refill takes less than 2 seconds.  Neurological:     Mental Status: She is alert and oriented to person, place,  and time.  Psychiatric:        Behavior: Behavior normal.      Musculoskeletal Exam: C-spine was in good range of motion.  Shoulder joints, elbow joints, wrist joints, MCPs PIPs and DIPs with good range of motion with no synovitis.  Hip joints, knee joints, ankles were in good range of motion with no synovitis.  She had no tenderness over MTPs.  She had discomfort range of motion of her shoulders and her right elbow. CDAI Exam: CDAI Score: 2.6  Patient Global: 3 mm; Provider Global: 3 mm Swollen: 0 ; Tender: 2  Joint Exam 07/01/2020      Right  Left  Elbow   Tender     Knee   Tender        Investigation: No additional findings.  Imaging:  CT CHEST ABDOMEN PELVIS W CONTRAST  Result Date: 06/03/2020 CLINICAL DATA:  Elevated liver function studies. History of multiple malignancies, including reported pancreatic cancer, colon cancer and ovarian/uterine cancer. Per previous radiology reports history of pancreatic resection margin recurrence 06/07/2019 with subsequent chemotherapy. Assess response to chemotherapy. EXAM: CT CHEST, ABDOMEN, AND PELVIS WITH CONTRAST TECHNIQUE: Multidetector CT imaging of the chest, abdomen and pelvis was performed following the standard protocol during bolus administration of intravenous contrast. CONTRAST:  127mL OMNIPAQUE IOHEXOL 300 MG/ML  SOLN COMPARISON:  Prior CTs 04/11/2020 and 01/10/2020. FINDINGS: CT CHEST FINDINGS Cardiovascular: Right IJ Port-A-Cath extends to the level of the upper right atrium. No acute vascular findings. The heart size is normal. There is no pericardial effusion. Mediastinum/Nodes: There are no enlarged mediastinal, hilar or axillary lymph nodes. The thyroid gland, trachea and esophagus demonstrate no significant findings. Lungs/Pleura: There is no pleural effusion or pneumothorax. The lungs are clear, without suspicious nodularity. Musculoskeletal/Chest wall: No chest wall mass or suspicious osseous findings. CT ABDOMEN AND PELVIS  FINDINGS Hepatobiliary: Previously demonstrated hepatic steatosis is now more heterogeneous with a geographic distribution. This makes identification of the underlying liver lesions more challenging. However, most of the lesions are less conspicuous and appear improved. A lesion superior to the cholecystectomy bed is slightly more prominent, measuring up to 9 mm on image 83/7. A 6 mm lesion laterally in the caudate lobe (image 44/2) is not significantly changed. Previously characterized hemangioma measuring 2.6 x 1.6 cm in segment 4A (image 71/7) is unchanged. There is new intrahepatic biliary dilatation posteriorly in the right hepatic lobe without clear etiology. No significant extrahepatic biliary dilatation post cholecystectomy. Pancreas: Stable appearance of the pancreatic head status post distal pancreatectomy. There is no pancreatic ductal dilatation. Soft tissue mass along the superior aspect of the pancreatectomy margin and lateral to the superior mesenteric artery is unchanged, measuring 2.8 x 1.6 cm on image 99/7. Spleen: Status post splenectomy. Adrenals/Urinary Tract: Both adrenal glands appear normal. There is a small nonobstructing calculus in the lower pole of the left kidney. Both kidneys otherwise appear normal. No evidence of ureteral calculus or hydronephrosis. The bladder appears normal. Stomach/Bowel: Enteric contrast was administered and has passed into the distal small bowel. The stomach appears unremarkable for its degree of distension. No evidence of bowel wall thickening, distention or surrounding inflammatory change. Stable postsurgical changes status post right hemicolectomy. Vascular/Lymphatic: There are no enlarged abdominal or pelvic lymph nodes. Minimal aortic and branch vessel atherosclerosis without acute vascular findings. The portal, superior mesenteric and splenic veins are patent. Reproductive: Hysterectomy. No adnexal mass. Probable mild pelvic floor laxity. Other:  Postsurgical changes in the anterior abdominal wall with stable small supraumbilical hernias containing fat and small bowel. No evidence of incarceration or obstruction. No ascites or definite residual peritoneal nodularity. Musculoskeletal: No acute or significant osseous findings. IMPRESSION: 1. Stable chest CT, without acute findings or evidence of metastatic disease. 2. Assessment of the patient's liver disease is limited by progressive geographic hepatic steatosis. There is one central hepatic lesion which appears slightly more conspicuous, and this may be contributing to new intrahepatic biliary dilatation posteriorly in the right lobe. The other hepatic lesions are generally improved. No extrahepatic biliary dilatation. 3. 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 No apparent residual peritoneal disease. 4. Stable postsurgical changes and small hernias of the anterior abdominal wall. Electronically Signed   By: Richardean Sale M.D.   On: 06/03/2020 13:30  Recent Labs: Lab Results  Component Value Date   WBC 8.3 06/24/2020   HGB 11.7 (L) 06/24/2020   PLT 521 (H) 06/24/2020   NA 138 06/24/2020   K 4.2 06/24/2020   CL 102 06/24/2020   CO2 27 06/24/2020   GLUCOSE 197 (H) 06/24/2020   BUN 9 06/24/2020   CREATININE 0.70 06/24/2020   BILITOT 0.4 06/24/2020   ALKPHOS 227 (H) 06/24/2020   AST 68 (H) 06/24/2020   ALT 79 (H) 06/24/2020   PROT 6.1 (L) 06/24/2020   ALBUMIN 3.0 (L) 06/24/2020   CALCIUM 8.8 (L) 06/24/2020   GFRAA >60 11/09/2019   QFTBGOLDPLUS NEGATIVE 06/27/2018    Speciality Comments: No specialty comments available.  Procedures:  Medium Joint Inj: R elbow on 07/01/2020 4:02 PM Indications: pain Details: 27 G 1.5 in needle, posterior approach Medications: 30 mg triamcinolone acetonide 40 MG/ML; 1 mL lidocaine 1 % Aspirate: 0 mL Outcome: tolerated well, no immediate complications Procedure, treatment  alternatives, risks and benefits explained, specific risks discussed. Consent was given by the patient. Immediately prior to procedure a time out was called to verify the correct patient, procedure, equipment, support staff and site/side marked as required. Patient was prepped and draped in the usual sterile fashion.     Allergies: Codeine, Other, Penicillins, and Bactrim [sulfamethoxazole-trimethoprim]   Assessment / Plan:     Visit Diagnoses: Rheumatoid arthritis of multiple sites with negative rheumatoid factor (HCC)-she had no synovitis on my examination.  Although she continues to have some arthralgias.  She states the chemotherapy is helping her with the rheumatoid arthritis as well.  She complains of some stiffness in her shoulders and discomfort in her right elbow and right knee joint.  She requests getting cortisone injection to her right elbow joint.  She developed contracture in the right elbow in the past.  High risk medication use - Treated with Orencia and methotrexate in the past.  She has been off medications due to chemotherapy.  Long term (current) use of systemic steroids - She is on prednisone 5-10 mg p.o. daily for almost 2 years now.  She is currently on prednisone 5 mg p.o. daily.  DEXA 01/02/2020: T-score: 0.3, BMD: 1.084 AP spine.  Pain in right elbow-she complains of pain and discomfort in her right elbow joint.  No synovitis was noted.  Per her request after right elbow joint was prepped in sterile fashion it was injected with lidocaine and Kenalog as described above.  She tolerated the procedure well.  Postprocedure instructions were given.  Primary osteoarthritis of right knee-she continues to have some discomfort.  She has also gained weight.  She is trying to lose weight.  Status post left partial knee replacement-no warmth swelling or effusion was noted.  She had good range of motion.  HLA B27 (HLA B27 positive)  Family history of rheumatoid arthritis  Status  post splenectomy  Pancreatic neoplasm - S/P RTX, still on CTX. She is followed by Dr. Alvy Bimler and Phoebe Perch.  Secondary malignant neoplasm of right ovary (HCC)  History of colon cancer  Orders: Orders Placed This Encounter  Procedures  . Medium Joint Inj   No orders of the defined types were placed in this encounter.    Follow-Up Instructions: Return in about 6 months (around 01/01/2021) for Rheumatoid arthritis, Osteoarthritis.   Bo Merino, MD  Note - This record has been created using Editor, commissioning.  Chart creation errors have been sought, but may not always  have been located. Such creation errors  do not reflect on  the standard of medical care.

## 2020-07-01 ENCOUNTER — Telehealth: Payer: Self-pay

## 2020-07-01 ENCOUNTER — Ambulatory Visit (INDEPENDENT_AMBULATORY_CARE_PROVIDER_SITE_OTHER): Payer: BC Managed Care – PPO | Admitting: Rheumatology

## 2020-07-01 ENCOUNTER — Encounter: Payer: Self-pay | Admitting: Rheumatology

## 2020-07-01 ENCOUNTER — Other Ambulatory Visit: Payer: Self-pay

## 2020-07-01 VITALS — BP 128/70 | HR 74 | Resp 15 | Ht 62.0 in | Wt 230.6 lb

## 2020-07-01 DIAGNOSIS — Z96652 Presence of left artificial knee joint: Secondary | ICD-10-CM

## 2020-07-01 DIAGNOSIS — D490 Neoplasm of unspecified behavior of digestive system: Secondary | ICD-10-CM

## 2020-07-01 DIAGNOSIS — Z7952 Long term (current) use of systemic steroids: Secondary | ICD-10-CM | POA: Diagnosis not present

## 2020-07-01 DIAGNOSIS — Z1589 Genetic susceptibility to other disease: Secondary | ICD-10-CM

## 2020-07-01 DIAGNOSIS — M25521 Pain in right elbow: Secondary | ICD-10-CM

## 2020-07-01 DIAGNOSIS — Z79899 Other long term (current) drug therapy: Secondary | ICD-10-CM | POA: Diagnosis not present

## 2020-07-01 DIAGNOSIS — C7961 Secondary malignant neoplasm of right ovary: Secondary | ICD-10-CM

## 2020-07-01 DIAGNOSIS — Z9081 Acquired absence of spleen: Secondary | ICD-10-CM

## 2020-07-01 DIAGNOSIS — M0609 Rheumatoid arthritis without rheumatoid factor, multiple sites: Secondary | ICD-10-CM

## 2020-07-01 DIAGNOSIS — Z8261 Family history of arthritis: Secondary | ICD-10-CM

## 2020-07-01 DIAGNOSIS — M1711 Unilateral primary osteoarthritis, right knee: Secondary | ICD-10-CM

## 2020-07-01 DIAGNOSIS — Z85038 Personal history of other malignant neoplasm of large intestine: Secondary | ICD-10-CM

## 2020-07-01 DIAGNOSIS — M62838 Other muscle spasm: Secondary | ICD-10-CM

## 2020-07-01 MED ORDER — TRIAMCINOLONE ACETONIDE 40 MG/ML IJ SUSP
30.0000 mg | INTRAMUSCULAR | Status: AC | PRN
  Administered 2020-07-01: 30 mg via INTRA_ARTICULAR

## 2020-07-01 MED ORDER — LIDOCAINE HCL 1 % IJ SOLN
1.0000 mL | INTRAMUSCULAR | Status: AC | PRN
  Administered 2020-07-01: 1 mL

## 2020-07-01 NOTE — Telephone Encounter (Signed)
That is fine 

## 2020-07-01 NOTE — Telephone Encounter (Signed)
Patient is going to rheumatology this afternoon and wants to inquire about getting cortisone shot in knee.   Patient is under active chemotherapy and has history of diabetes.  Patient wants to review with MD to ensure she is comfortable with steroid shot due to treatment.    Will forward to MD for review.

## 2020-07-01 NOTE — Telephone Encounter (Signed)
Patient notified

## 2020-07-08 ENCOUNTER — Inpatient Hospital Stay: Payer: BC Managed Care – PPO

## 2020-07-08 ENCOUNTER — Other Ambulatory Visit: Payer: Self-pay

## 2020-07-08 ENCOUNTER — Other Ambulatory Visit: Payer: Self-pay | Admitting: Hematology and Oncology

## 2020-07-08 ENCOUNTER — Telehealth: Payer: Self-pay

## 2020-07-08 ENCOUNTER — Other Ambulatory Visit (HOSPITAL_COMMUNITY): Payer: Self-pay

## 2020-07-08 VITALS — BP 135/95 | HR 84 | Temp 98.1°F | Resp 20 | Ht 62.0 in | Wt 219.4 lb

## 2020-07-08 DIAGNOSIS — Z7189 Other specified counseling: Secondary | ICD-10-CM

## 2020-07-08 DIAGNOSIS — C541 Malignant neoplasm of endometrium: Secondary | ICD-10-CM

## 2020-07-08 DIAGNOSIS — G893 Neoplasm related pain (acute) (chronic): Secondary | ICD-10-CM | POA: Diagnosis not present

## 2020-07-08 DIAGNOSIS — C252 Malignant neoplasm of tail of pancreas: Secondary | ICD-10-CM | POA: Diagnosis not present

## 2020-07-08 DIAGNOSIS — C7961 Secondary malignant neoplasm of right ovary: Secondary | ICD-10-CM | POA: Diagnosis not present

## 2020-07-08 DIAGNOSIS — C801 Malignant (primary) neoplasm, unspecified: Secondary | ICD-10-CM | POA: Diagnosis not present

## 2020-07-08 DIAGNOSIS — Z5111 Encounter for antineoplastic chemotherapy: Secondary | ICD-10-CM | POA: Diagnosis not present

## 2020-07-08 DIAGNOSIS — R5383 Other fatigue: Secondary | ICD-10-CM | POA: Diagnosis not present

## 2020-07-08 LAB — CMP (CANCER CENTER ONLY)
ALT: 64 U/L — ABNORMAL HIGH (ref 0–44)
AST: 58 U/L — ABNORMAL HIGH (ref 15–41)
Albumin: 3.2 g/dL — ABNORMAL LOW (ref 3.5–5.0)
Alkaline Phosphatase: 250 U/L — ABNORMAL HIGH (ref 38–126)
Anion gap: 10 (ref 5–15)
BUN: 12 mg/dL (ref 6–20)
CO2: 27 mmol/L (ref 22–32)
Calcium: 8.9 mg/dL (ref 8.9–10.3)
Chloride: 103 mmol/L (ref 98–111)
Creatinine: 0.69 mg/dL (ref 0.44–1.00)
GFR, Estimated: >60 mL/min (ref >60)
Glucose, Bld: 109 mg/dL — ABNORMAL HIGH (ref 70–99)
Potassium: 4 mmol/L (ref 3.5–5.1)
Sodium: 140 mmol/L (ref 135–145)
Total Bilirubin: 0.6 mg/dL (ref 0.3–1.2)
Total Protein: 6.5 g/dL (ref 6.5–8.1)

## 2020-07-08 LAB — CBC WITH DIFFERENTIAL (CANCER CENTER ONLY)
Abs Immature Granulocytes: 0.03 10*3/uL (ref 0.00–0.07)
Basophils Absolute: 0.1 10*3/uL (ref 0.0–0.1)
Basophils Relative: 1 %
Eosinophils Absolute: 0.2 10*3/uL (ref 0.0–0.5)
Eosinophils Relative: 2 %
HCT: 37.1 % (ref 36.0–46.0)
Hemoglobin: 12.2 g/dL (ref 12.0–15.0)
Immature Granulocytes: 0 %
Lymphocytes Relative: 25 %
Lymphs Abs: 2.6 10*3/uL (ref 0.7–4.0)
MCH: 31 pg (ref 26.0–34.0)
MCHC: 32.9 g/dL (ref 30.0–36.0)
MCV: 94.2 fL (ref 80.0–100.0)
Monocytes Absolute: 1.4 10*3/uL — ABNORMAL HIGH (ref 0.1–1.0)
Monocytes Relative: 14 %
Neutro Abs: 6.1 10*3/uL (ref 1.7–7.7)
Neutrophils Relative %: 58 %
Platelet Count: 298 10*3/uL (ref 150–400)
RBC: 3.94 MIL/uL (ref 3.87–5.11)
RDW: 18.6 % — ABNORMAL HIGH (ref 11.5–15.5)
WBC Count: 10.5 10*3/uL (ref 4.0–10.5)
nRBC: 0 % (ref 0.0–0.2)

## 2020-07-08 MED ORDER — SODIUM CHLORIDE 0.9 % IV SOLN
600.0000 mg/m2 | Freq: Once | INTRAVENOUS | Status: AC
  Administered 2020-07-08: 1292 mg via INTRAVENOUS
  Filled 2020-07-08: qty 33.98

## 2020-07-08 MED ORDER — SODIUM CHLORIDE 0.9 % IV SOLN
Freq: Once | INTRAVENOUS | Status: AC
  Filled 2020-07-08: qty 250

## 2020-07-08 MED ORDER — HEPARIN SOD (PORK) LOCK FLUSH 100 UNIT/ML IV SOLN
500.0000 [IU] | Freq: Once | INTRAVENOUS | Status: DC | PRN
  Filled 2020-07-08: qty 5

## 2020-07-08 MED ORDER — ONDANSETRON HCL 4 MG/2ML IJ SOLN
8.0000 mg | Freq: Once | INTRAMUSCULAR | Status: AC
  Administered 2020-07-08: 8 mg via INTRAVENOUS

## 2020-07-08 MED ORDER — ONDANSETRON HCL 4 MG/2ML IJ SOLN
INTRAMUSCULAR | Status: AC
  Filled 2020-07-08: qty 4

## 2020-07-08 MED ORDER — METFORMIN HCL 500 MG PO TABS: 1000.0000 mg | ORAL_TABLET | Freq: Two times a day (BID) | ORAL | 11 refills | Status: DC

## 2020-07-08 MED ORDER — METFORMIN HCL 500 MG PO TABS
1000.0000 mg | ORAL_TABLET | Freq: Two times a day (BID) | ORAL | 11 refills | Status: DC
  Filled 2020-07-08: qty 180, 45d supply, fill #0
  Filled 2020-07-08: qty 120, 30d supply, fill #0

## 2020-07-08 MED ORDER — SODIUM CHLORIDE 0.9% FLUSH
10.0000 mL | INTRAVENOUS | Status: DC | PRN
  Filled 2020-07-08: qty 10

## 2020-07-08 MED ORDER — SODIUM CHLORIDE 0.9% FLUSH
10.0000 mL | Freq: Once | INTRAVENOUS | Status: AC
Start: 2020-07-08 — End: 2020-07-08
  Administered 2020-07-08: 10 mL
  Filled 2020-07-08: qty 10

## 2020-07-08 NOTE — Telephone Encounter (Signed)
-----   Message from Heath Lark, MD sent at 07/08/2020 11:03 AM EDT ----- Regarding: RE: Metformin Thanks so much Also, please call [facility] pharmacy directly NOT to run it through insurance because they will reject that again and said she is willing to pay out of pocket ----- Message ----- From: Rennis Harding, RN Sent: 07/08/2020  11:03 AM EDT To: Heath Lark, MD Subject: RE: Metformin                                  30 day supply will be 9 dollars.   I will send it to [facility] and let patient know.   ----- Message ----- From: Heath Lark, MD Sent: 07/08/2020  10:53 AM EDT To: Rennis Harding, RN Subject: RE: Metformin                                  Before we do that, can you call [facility] and ask what is the cost of metformin? If it is very expensive then we have no choice but to take just daily ----- Message ----- From: Rennis Harding, RN Sent: 07/08/2020  10:33 AM EDT To: Heath Lark, MD Subject: Metformin                                      Patient called back to say that insurance is rejecting new refill request for Metformin.  I called pharmacy, insurance rejecting until 5/23, even with a dose change it is showing rejection.   Patient has 2-3 days left of Metformin.  Do you want patient to change directions so she has medication daily until 23rd, and then resume Metformin 500mg  1 in AM, 2 tablets in PM?

## 2020-07-08 NOTE — Telephone Encounter (Signed)
Patient notified, as long as Rx to [place].

## 2020-07-08 NOTE — Patient Instructions (Signed)
[facility]  Discharge Instructions: Thank you for choosing [facility] to provide your oncology and hematology care.   If you have a lab appointment with the Cancer Center, please go directly to the Cancer Center and check in at the registration area.   Wear comfortable clothing and clothing appropriate for easy access to any Portacath or PICC line.   We strive to give you quality time with your provider. You may need to reschedule your appointment if you arrive late (15 or more minutes).  Arriving late affects you and other patients whose appointments are after yours.  Also, if you miss three or more appointments without notifying the office, you may be dismissed from the clinic at the provider's discretion.      For prescription refill requests, have your pharmacy contact our office and allow 72 hours for refills to be completed.    Today you received the following chemotherapy and/or immunotherapy agents gemzar      To help prevent nausea and vomiting after your treatment, we encourage you to take your nausea medication as directed.  BELOW ARE SYMPTOMS THAT SHOULD BE REPORTED IMMEDIATELY: *FEVER GREATER THAN 100.4 F (38 C) OR HIGHER *CHILLS OR SWEATING *NAUSEA AND VOMITING THAT IS NOT CONTROLLED WITH YOUR NAUSEA MEDICATION *UNUSUAL SHORTNESS OF BREATH *UNUSUAL BRUISING OR BLEEDING *URINARY PROBLEMS (pain or burning when urinating, or frequent urination) *BOWEL PROBLEMS (unusual diarrhea, constipation, pain near the anus) TENDERNESS IN MOUTH AND THROAT WITH OR WITHOUT PRESENCE OF ULCERS (sore throat, sores in mouth, or a toothache) UNUSUAL RASH, SWELLING OR PAIN  UNUSUAL VAGINAL DISCHARGE OR ITCHING   Items with * indicate a potential emergency and should be followed up as soon as possible or go to the Emergency Department if any problems should occur.  Please show the CHEMOTHERAPY ALERT CARD or IMMUNOTHERAPY ALERT CARD at check-in to the  Emergency Department and triage nurse.  Should you have questions after your visit or need to cancel or reschedule your appointment, please contact [facility]  Dept: [phone]  and follow the prompts.  Office hours are 8:00 a.m. to 4:30 p.m. Monday - Friday. Please note that voicemails left after 4:00 p.m. may not be returned until the following business day.  We are closed weekends and major holidays. You have access to a nurse at all times for urgent questions. Please call the main number to the clinic Dept: [phone] and follow the prompts.   For any non-urgent questions, you may also contact your provider using MyChart. We now offer e-Visits for anyone 18 and older to request care online for non-urgent symptoms. For details visit [email or web address].   Also download the MyChart app! Go to the app store, search "MyChart", open the app, select [facility], and log in with your MyChart username and password.  Due to Covid, a mask is required upon entering the hospital/clinic. If you do not have a mask, one will be given to you upon arrival. For doctor visits, patients may have 1 support person aged 18 or older with them. For treatment visits, patients cannot have anyone with them due to current Covid guidelines and our immunocompromised population.   

## 2020-07-08 NOTE — Telephone Encounter (Signed)
Patient notified

## 2020-07-08 NOTE — Telephone Encounter (Signed)
-----   Message from Heath Lark, MD sent at 07/08/2020  9:31 AM EDT ----- Regarding: RE: Metformin I am refilling to her pharmacy that says 2 tabs BID because we might have to increase that further Tell her to call her pharmacy to refill that due to dose changes but for now take 1 in the morning and 2 in the evening as discussed ----- Message ----- From: Rennis Harding, RN Sent: 07/08/2020   9:24 AM EDT To: Heath Lark, MD Subject: Metformin                                      Patient is currently taking Metformin 500mg  1 tablet in AM; 2 tablets in PM as directed.   Patient is running out, and they will not allow refill until 26th.     Her original prescriptions reflects different directions but has been modified due to liver enzymes etc.   Are you OK if I send in new Rx reflecting new directions to see if patient will be allowed to receive medication?

## 2020-07-16 ENCOUNTER — Telehealth: Payer: Self-pay

## 2020-07-16 ENCOUNTER — Other Ambulatory Visit: Payer: Self-pay | Admitting: Hematology and Oncology

## 2020-07-16 MED ORDER — METHADONE HCL 10 MG PO TABS: 10.0000 mg | ORAL_TABLET | Freq: Two times a day (BID) | ORAL | 0 refills | Status: DC

## 2020-07-16 MED ORDER — HYDROMORPHONE HCL 4 MG PO TABS: 4.0000 mg | ORAL_TABLET | ORAL | 0 refills | Status: DC | PRN

## 2020-07-16 NOTE — Telephone Encounter (Signed)
1) I refilled both methadone and dilaudid, but warn her that her local pharmacy may not stock it. If not able, we can send to [facility] 2) OK to get massage

## 2020-07-16 NOTE — Telephone Encounter (Signed)
Pt called and LVM requesting refill for Methadone 10 mg and HYDROmorphone 4 mg. The Methadone is not listed on pt's current medication list and shows it was d/c in 2021. Pt also asks if she is able to get a massage. This LPN attempted to return pt's call. LVM to let pt know I would give MD her message.

## 2020-07-25 ENCOUNTER — Inpatient Hospital Stay: Payer: BC Managed Care – PPO

## 2020-07-25 ENCOUNTER — Other Ambulatory Visit: Payer: Self-pay

## 2020-07-25 ENCOUNTER — Inpatient Hospital Stay: Payer: BC Managed Care – PPO | Attending: Hematology and Oncology

## 2020-07-25 ENCOUNTER — Encounter: Payer: Self-pay | Admitting: Hematology and Oncology

## 2020-07-25 ENCOUNTER — Inpatient Hospital Stay (HOSPITAL_BASED_OUTPATIENT_CLINIC_OR_DEPARTMENT_OTHER): Payer: BC Managed Care – PPO | Admitting: Hematology and Oncology

## 2020-07-25 DIAGNOSIS — Z5111 Encounter for antineoplastic chemotherapy: Secondary | ICD-10-CM | POA: Diagnosis not present

## 2020-07-25 DIAGNOSIS — Z7189 Other specified counseling: Secondary | ICD-10-CM

## 2020-07-25 DIAGNOSIS — E099 Drug or chemical induced diabetes mellitus without complications: Secondary | ICD-10-CM

## 2020-07-25 DIAGNOSIS — C541 Malignant neoplasm of endometrium: Secondary | ICD-10-CM

## 2020-07-25 DIAGNOSIS — R748 Abnormal levels of other serum enzymes: Secondary | ICD-10-CM | POA: Diagnosis not present

## 2020-07-25 DIAGNOSIS — C252 Malignant neoplasm of tail of pancreas: Secondary | ICD-10-CM

## 2020-07-25 DIAGNOSIS — G893 Neoplasm related pain (acute) (chronic): Secondary | ICD-10-CM

## 2020-07-25 DIAGNOSIS — C7961 Secondary malignant neoplasm of right ovary: Secondary | ICD-10-CM

## 2020-07-25 DIAGNOSIS — T380X5D Adverse effect of glucocorticoids and synthetic analogues, subsequent encounter: Secondary | ICD-10-CM

## 2020-07-25 LAB — CBC WITH DIFFERENTIAL (CANCER CENTER ONLY)
Abs Immature Granulocytes: 0.03 10*3/uL (ref 0.00–0.07)
Basophils Absolute: 0.1 10*3/uL (ref 0.0–0.1)
Basophils Relative: 1 %
Eosinophils Absolute: 0.2 10*3/uL (ref 0.0–0.5)
Eosinophils Relative: 2 %
HCT: 36.3 % (ref 36.0–46.0)
Hemoglobin: 12.1 g/dL (ref 12.0–15.0)
Immature Granulocytes: 0 %
Lymphocytes Relative: 26 %
Lymphs Abs: 2.5 10*3/uL (ref 0.7–4.0)
MCH: 31.3 pg (ref 26.0–34.0)
MCHC: 33.3 g/dL (ref 30.0–36.0)
MCV: 94 fL (ref 80.0–100.0)
Monocytes Absolute: 1.3 10*3/uL — ABNORMAL HIGH (ref 0.1–1.0)
Monocytes Relative: 13 %
Neutro Abs: 5.7 10*3/uL (ref 1.7–7.7)
Neutrophils Relative %: 58 %
Platelet Count: 455 10*3/uL — ABNORMAL HIGH (ref 150–400)
RBC: 3.86 MIL/uL — ABNORMAL LOW (ref 3.87–5.11)
RDW: 18.9 % — ABNORMAL HIGH (ref 11.5–15.5)
WBC Count: 9.8 10*3/uL (ref 4.0–10.5)
nRBC: 0 % (ref 0.0–0.2)

## 2020-07-25 LAB — CMP (CANCER CENTER ONLY)
ALT: 58 U/L — ABNORMAL HIGH (ref 0–44)
AST: 59 U/L — ABNORMAL HIGH (ref 15–41)
Albumin: 3.2 g/dL — ABNORMAL LOW (ref 3.5–5.0)
Alkaline Phosphatase: 332 U/L — ABNORMAL HIGH (ref 38–126)
Anion gap: 10 (ref 5–15)
BUN: 11 mg/dL (ref 6–20)
CO2: 24 mmol/L (ref 22–32)
Calcium: 9 mg/dL (ref 8.9–10.3)
Chloride: 103 mmol/L (ref 98–111)
Creatinine: 0.68 mg/dL (ref 0.44–1.00)
GFR, Estimated: >60 mL/min (ref >60)
Glucose, Bld: 130 mg/dL — ABNORMAL HIGH (ref 70–99)
Potassium: 4.1 mmol/L (ref 3.5–5.1)
Sodium: 137 mmol/L (ref 135–145)
Total Bilirubin: 0.5 mg/dL (ref 0.3–1.2)
Total Protein: 6.4 g/dL — ABNORMAL LOW (ref 6.5–8.1)

## 2020-07-25 MED ORDER — SODIUM CHLORIDE 0.9% FLUSH
10.0000 mL | Freq: Once | INTRAVENOUS | Status: AC
  Administered 2020-07-25: 10 mL
  Filled 2020-07-25: qty 10

## 2020-07-25 MED ORDER — ONDANSETRON HCL 4 MG/2ML IJ SOLN
8.0000 mg | Freq: Once | INTRAMUSCULAR | Status: AC
  Administered 2020-07-25: 8 mg via INTRAVENOUS

## 2020-07-25 MED ORDER — SODIUM CHLORIDE 0.9 % IV SOLN
Freq: Once | INTRAVENOUS | Status: AC
  Filled 2020-07-25: qty 250

## 2020-07-25 MED ORDER — SODIUM CHLORIDE 0.9% FLUSH
10.0000 mL | INTRAVENOUS | Status: DC | PRN
Start: 2020-07-25 — End: 2020-07-25
  Administered 2020-07-25: 10 mL
  Filled 2020-07-25: qty 10

## 2020-07-25 MED ORDER — HEPARIN SOD (PORK) LOCK FLUSH 100 UNIT/ML IV SOLN
500.0000 [IU] | Freq: Once | INTRAVENOUS | Status: AC | PRN
  Administered 2020-07-25: 500 [IU]
  Filled 2020-07-25: qty 5

## 2020-07-25 MED ORDER — ONDANSETRON HCL 4 MG/2ML IJ SOLN
INTRAMUSCULAR | Status: AC
  Filled 2020-07-25: qty 4

## 2020-07-25 MED ORDER — SODIUM CHLORIDE 0.9 % IV SOLN
600.0000 mg/m2 | Freq: Once | INTRAVENOUS | Status: AC
  Administered 2020-07-25: 1292 mg via INTRAVENOUS
  Filled 2020-07-25: qty 33.98

## 2020-07-25 NOTE — Assessment & Plan Note (Signed)
She has chronic pain secondary to her disease and inflammatory arthritis I refill her prescription recently I anticipate she may have worsening bone pain with prednisone taper

## 2020-07-25 NOTE — Assessment & Plan Note (Signed)
Her profound elevated liver enzymes are not due to cancer progression Significant fatty liver change is seen The patient is able to taper off prednisone completely without difficulties Her liver enzymes are stable She will continue risk factor modification with dietary changes and weight loss

## 2020-07-25 NOTE — Assessment & Plan Note (Signed)
Per previous discussion, we will continue on maintenance gemcitabine every 2 weeks, with dose reduction due to abnormal liver enzymes Her counts are stable Apart from some joint pain which is unrelated and muscle twitches, she tolerated treatment very well For now, we will continue treatment indefinitely Plan to repeat imaging study again in July

## 2020-07-25 NOTE — Progress Notes (Signed)
[facility] OFFICE PROGRESS NOTE  Patient Care Team: Morrow, Aaron, MD as PCP - General (Family Medicine)  ASSESSMENT & PLAN:  Malignant neoplasm of tail of pancreas (HCC) Per previous discussion, we will continue on maintenance gemcitabine every 2 weeks, with dose reduction due to abnormal liver enzymes Her counts are stable Apart from some joint pain which is unrelated and muscle twitches, she tolerated treatment very well For now, we will continue treatment indefinitely Plan to repeat imaging study again in July  Elevated liver enzymes Her profound elevated liver enzymes are not due to cancer progression Significant fatty liver change is seen The patient is able to taper off prednisone completely without difficulties Her liver enzymes are stable She will continue risk factor modification with dietary changes and weight loss  Cancer associated pain She has chronic pain secondary to her disease and inflammatory arthritis I refill her prescription recently I anticipate she may have worsening bone pain with prednisone taper  Steroid-induced diabetes (HCC) Since discontinuation of steroids, her blood sugar is better She will continue metformin and lifestyle modification   No orders of the defined types were placed in this encounter.   All questions were answered. The patient knows to call the clinic with any problems, questions or concerns. The total time spent in the appointment was 20 minutes encounter with patients including review of chart and various tests results, discussions about plan of care and coordination of care plan    , MD 07/25/2020 2:56 PM  INTERVAL HISTORY: Please see below for problem oriented charting. 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 Mixed attenuation mass emanates from the right adnexa measuring 10.8 x 8.0 cm very suspicious for right ovarian carcinoma. 2. Abnormality of the tail of the pancreas may be due to mild pancreatitis and small pseudocyst formation, but neoplasm cannot be excluded. 3. Small amount of ascites within abdomen and pelvis. 4. Small nonobstructing renal calculi bilaterally.    08/30/2017 Pathology Results   1. Uterus and cervix, with left fallopian tube - ENDOMETRIOID ADENOCARCINOMA, FIGO GRADE I, ARISING IN A BACKGROUND OF DIFFUSE COMPLEX ATYPICAL HYPERPLASIA. - CARCINOMA INVADES FOR OF DEPTH OF 0.2 CM WHERE THICKNESS OF MYOMETRIAL WALL IS 2.1 CM. - ALL RESECTION MARGINS ARE NEGATIVE FOR CARCINOMA. - NEGATIVE FOR LYMPHOVASCULAR OR PERINEURAL INVASION. - CERVICAL STROMA IS NOT INVOLVED. - SEE ONCOLOGY TABLE. - SEE NOTE 2. Ovary and fallopian tube, right - PRIMARY OVARIAN ENDOMETRIOID ADENOCARCINOMA, FIGO GRADE II, 12 CM. - THE OVARIAN SURFACE IS FOCALLY INVOLVED BY CARCINOMA. - NEGATIVE FOR LYMPHOVASCULAR INVASION. - BENIGN UNREMARKABLE FALLOPIAN TUBE, NEGATIVE FOR CARCINOMA. - SEE ONCOLOGY TABLE. - SEE NOTE 3. Cul-de-sac biopsy - METASTATIC ADENOCARCINOMA, MOST CONSISTENT WITH PRIMARY OVARIAN ENDOMETRIOID ADENOCARCINOMA. 4. Ovary, left - BENIGN UNREMARKABLE OVARY, NEGATIVE FOR MALIGNANCY. Microscopic Comment 1. UTERUS, CARCINOMA OR CARCINOSARCOMA Procedure: Total hysterectomy with bilateral salpingo-oophorectomy. Histologic type: Endometrioid adenocarcinoma. Histologic Grade: FIGO Grade I Myometrial invasion: Depth of invasion: 2 mm Myometrial thickness: 21 mm Uterine Serosa  Involvement: Not identified Cervical stromal involvement: Not identified Extent of involvement   of other organs: Not applicable Lymphovascular invasion: Not identified Regional Lymph Nodes: Examined: 0 Sentinel 0 Non-sentinel 0 Total Tumor block for ancillary studies: 1H MMR / MSI testing: Pending Pathologic Stage Classification (pTNM, AJCC 8th edition): pT1a, pNX (v4.1.0.0) 2. OVARY or FALLOPIAN TUBE or PRIMARY PERITONEUM: Procedure: Salpingo-oophorectomy Specimen Integrity: Intact Tumor Site: Right ovary Ovarian Surface Involvement (required only if applicable): Focally involved by carcinoma Fallopian Tube Surface Involvement (required only if applicable): Not identified Tumor Size: 12 cm Histologic Type: Endometrioid adenocarcinoma Histologic Grade: Grade II Implants (required for advanced stage serous/seromucinous borderline tumors only): Not applicable Other Tissue/ Organ Involvement: Cul de sac biopsy involved by tumor Largest Extrapelvic Peritoneal Focus (required only if applicable): Not applicable Peritoneal/Ascitic Fluid: Negative for carcinoma (case # [id number]) Treatment Effect (required only for high-grade serous carcinomas): Not applicable Regional Lymph Nodes: No lymph nodes submitted or found Number of Lymph Nodes Examined: 0 Pathologic Stage Classification (pTNM, AJCC 8th Edition): pT2b, pN0 Representative Tumor Block: 2B and 2E 1. Molecular study for microsatellite instability and immunohistochemical stains for MMR-related proteins are pending and will be reported in an addendum. 2. Immunohistochemical stain show that the ovarian tumor is positive for CK7 and PAX8 (both diffuse), CDX2 (patchy and weak); and negative for CK20. This immunoprofile is consistent with the above diagnosis. Dr. Lyndon Code has reviewed this case and concurs with the above diagnosis. Molecular study for microsatellite instability and immunohistochemical stains for MMR-related proteins are pending  and will be reported in an addendum   08/30/2017 Genetic Testing   Patient has genetic testing done for MSI  Results revealed patient has the following mutation(s): loss of [facility] 2   08/30/2017 Surgery   Surgeon: Mart Piggs, MD Pre-operative Diagnosis:  1. Adnexal mass 2. Abnormal uterine bleeding 3. H/o Cecal CA  Post-operative Diagnosis:  1. Adhesive disease post colon resection 2. Endometrial cancer NOS 3. Adenocarcinoma unknown origin, right ovary, suspicious for GI primary  Operation:  1. Lysis of adhesions ~30 minutes 2. Robotic-assisted laparoscopic total hysterectomy with right salpingo-oophorectomy and left salpingectomy 3. Left oophorectomy (RA-laparoscopic) 4. Pelvic washings  Findings: Adhesions of omentum to anterior abdominal wall. Enlarged cystic right ovary ~10cm. Uterus had small nodules on serosa near where right adnexa was intimate with the surface. No obvious intraoperative rupture of cyst, although in 2 areas the wall was thin and one of these areas had some bleeding. Slight scarring of left bladder dome to LUS/cervix. Uterus on frozen section c/w hyperplasia and a small focus of endometrial CA - no myo invasion, <2cm in size. Frozen section on the right adnexa was carcinoma, met from colon or possibly Gyn, favor GI primary, defer to permanent. Left ovary was WNL.     09/15/2017 Cancer Staging   Staging form: Corpus Uteri - Carcinoma and Carcinosarcoma, AJCC 8th Edition - Pathologic: FIGO Stage IA (pT1a, pN0, cM0) - Signed by Heath Lark, MD on 09/15/2017   09/26/2017 Procedure   Successful placement of a right internal jugular approach power injectable Port-A-Cath. The catheter is ready for immediate use.   11/07/2017 Imaging   1. Since 08/18/2017, similar to slight decrease in size of a pancreatic body/tail junction lesion. Cross modality comparison relative to 09/16/2017 MRI is also grossly similar. 2. No evidence of metastatic disease. 3. Aortic  Atherosclerosis (ICD10-I70.0).  4. Left nephrolithiasis.   11/18/2017 Genetic Testing   MSH2 c.2237dupT pathogenic mutation identified in the CancerNext panel.  The CancerNext gene panel offered by [employer] includes sequencing and rearrangement analysis for  the following 34 genes:   APC, ATM, BARD1, BMPR1A, BRCA1, BRCA2, BRIP1, CDH1, CDK4, CDKN2A, CHEK2, DICER1, HOXB13, EPCAM, GREM1, MLH1, MRE11A, MSH2, MSH6, MUTYH, NBN, NF1, PALB2, PMS2, POLD1, POLE, PTEN, RAD50, RAD51C, RAD51D, SMAD4, SMARCA4, STK11, and TP53.  The report date is November 18, 2017.  MSH2 [id number] pathogenic mutation identified on somatic testing.  These results are consistent with a diagnosis of Lynch syndrome.   06/18/2019 - 08/14/2019 Chemotherapy   The patient had FOLFIRINOX for chemotherapy treatment.     01/19/2020 -  Chemotherapy    Patient is on Treatment Plan: PANCREATIC ABRAXANE / GEMCITABINE D1,8,15 Q28D      04/11/2020 Imaging   1. Essentially stable appearance of the soft tissue density process along the distal pancreatectomy site, abutting the posterior portion of the stomach, celiac trunk, left lateral margin of the SMA, and potentially the superior margin of the left renal vein. This could be inflammatory but residual tumor not excluded. 2. Some of the liver lesions are mildly reduced in size and some of the liver lesions are stable in size. 3. Further reduction in omental nodularity. 4. Small paraumbilical hernia contains a loop of small bowel, without findings of strangulation or obstruction. 5. Small supraumbilical hernias containing adipose tissue. 6. Small nonobstructive left renal calculus.   Secondary malignant neoplasm of right ovary (HCC)  08/23/2017 Tumor Marker   Patient's tumor was tested for the following markers: CA-125 Results of the tumor marker test revealed 139.8   08/31/2017 Initial Diagnosis   Secondary malignant neoplasm of right ovary (HCC)   09/15/2017 Cancer  Staging   Staging form: Ovary, Fallopian Tube, and Primary Peritoneal Carcinoma, AJCC 8th Edition - Pathologic: Stage IIB (pT2b, pN0, cM0) - Signed by Roselia Snipe, MD on 09/15/2017   09/27/2017 Imaging   No evidence of metastatic disease or other acute findings within the thorax.  4 cm low-attenuation mass in pancreatic tail, highly suspicious for pancreatic carcinoma. This is caused splenic vein thrombosis, with new venous collaterals in the left upper quadrant. Consider endoscopic ultrasound with FNA for tissue diagnosis.  Stable benign hepatic hemangioma.    09/27/2017 Tumor Marker   Patient's tumor was tested for the following markers: CA-125 Results of the tumor marker test revealed 39.4   11/02/2017 Tumor Marker   Patient's tumor was tested for the following markers: CA-125 Results of the tumor marker test revealed 19   11/07/2017 Tumor Marker   Patient's tumor was tested for the following markers: CA-125 Results of the tumor marker test revealed 18.3   11/18/2017 Genetic Testing   MSH2 c.2237dupT pathogenic mutation identified in the CancerNext panel.  The CancerNext gene panel offered by [employer] includes sequencing and rearrangement analysis for the following 34 genes:   APC, ATM, BARD1, BMPR1A, BRCA1, BRCA2, BRIP1, CDH1, CDK4, CDKN2A, CHEK2, DICER1, HOXB13, EPCAM, GREM1, MLH1, MRE11A, MSH2, MSH6, MUTYH, NBN, NF1, PALB2, PMS2, POLD1, POLE, PTEN, RAD50, RAD51C, RAD51D, SMAD4, SMARCA4, STK11, and TP53.  The report date is November 18, 2017.  MSH2 [id number] pathogenic mutation identified on somatic testing.  These results are consistent with a diagnosis of Lynch syndrome.   12/15/2017 Tumor Marker   Patient's tumor was tested for the following markers: CA-125 Results of the tumor marker test revealed 57.3   01/16/2018 Tumor Marker   Patient's tumor was tested for the following markers: CA-125 Results of the tumor marker test revealed 24.2   04/10/2018 Tumor  Marker   Patient's tumor was tested for the following markers: CA-125 Results of   the tumor marker test revealed 18.2   07/12/2018 Tumor Marker   Patient's tumor was tested for the following markers: CA-125 Results of the tumor marker test revealed 11.8   10/16/2018 Tumor Marker   Patient's tumor was tested for the following markers: CA125 Results of the tumor marker test revealed 12.4.   01/18/2020 Tumor Marker   Patient's tumor was tested for the following markers: CA-125 Results of the tumor marker test revealed 18   Malignant neoplasm of tail of pancreas ([place])  10/13/2017 Pathology Results   Pancreas tail mass, endoscopic ultrasound-guided, fine needle aspiration II (smears and cell block): Adenocarcinoma   11/18/2017 Genetic Testing   MSH2 c.2237dupT pathogenic mutation identified in the CancerNext panel.  The CancerNext gene panel offered by [employer] includes sequencing and rearrangement analysis for the following 34 genes:   APC, ATM, BARD1, BMPR1A, BRCA1, BRCA2, BRIP1, CDH1, CDK4, CDKN2A, CHEK2, DICER1, HOXB13, EPCAM, GREM1, MLH1, MRE11A, MSH2, MSH6, MUTYH, NBN, NF1, PALB2, PMS2, POLD1, POLE, PTEN, RAD50, RAD51C, RAD51D, SMAD4, SMARCA4, STK11, and TP53.  The report date is November 18, 2017.  MSH2 [id number] pathogenic mutation identified on somatic testing.  These results are consistent with a diagnosis of Lynch syndrome.   11/24/2017 Pathology Results   A. "TAIL OF PANCREAS AND SPLEEN", DISTAL PANCREATECTOMY AND SPLENECTOMY: Invasive ductal adenocarcinoma, moderately to poorly differentiated with focal signet ring cell features, of pancreas (distal).   The carcinoma is 2.5 cm in greatest dimension grossly.   Treatment effects present in the form of fibrosis (50%). No lymphovascular or definite perineural invasion identified. All surgical margins are negative for tumor or high-grade dysplasia. Adjacent mucinous neoplasm,  most consistent with Intraductal papillary mucinous neoplasm (IPMN) with low-grade dysplasia.   Uninvolved pancreas show atrophy and focal acute inflammation.   Twelve benign lymph nodes (0/12). Spleen with no significant histopathologic abnormalities.  PROCEDURE: distal pancreatectomy and splenectomy TUMOR SITE: distal pancreas TUMOR SIZE:  GREATEST DIMENSION: 2.5 cm  ADDITIONAL DIMENSIONS:  x  cm HISTOLOGIC TYPE: ductal adenocarcinoma HISTOLOGIC GRADE: grade 3 TUMOR EXTENSION: peripancreatic soft tissue MARGINS: negative for tumor TREATMENT EFFECT: treatment effects present in the form of fibrosis (50%). LYMPHOVASCULAR INVASION: not identified PERINEURAL INVASION: no definite evidence REGIONAL LYMPH NODES:   NUMBER OF LYMPH NODES INVOLVED: 0   NUMBER OF LYMPH NODES EXAMINED: 12 PATHOLOGIC STAGE CLASSIFICATION (pTNM, AJCC 8th Ed): ypT2, ypN0 DISTANT METASTASIS (pM): pMx ADDITIONAL PATHOLOGIC FINDINGS: mucinous neoplasm, most consistent with intraductal papillary mucinous neoplasm (IPMN) with low-grade dysplasia, is identified adjacent to the main tumor.   11/24/2017 Cancer Staging   Staging form: Exocrine Pancreas, AJCC 8th Edition - Pathologic stage from 11/24/2017: Stage IB (pT2, pN0, cM0) - Signed by Truitt Merle, MD on 01/03/2018   11/25/2017 Surgery   She had surgery at [facility] 1. Exploratory Laparotomy 2. Distal Pancreatectomy and Splenectomy 3. Intraoperative Ultrasound 4. Open Cholecystectomy    12/15/2017 Cancer Staging   Staging form: Exocrine Pancreas, AJCC 8th Edition - Clinical: Stage IB (cT2, cN0, cM0) - Signed by Heath Lark, MD on 12/15/2017   12/28/2017 Imaging   12/28/2017 CT Abdomen  IMPRESSION: 1. Postoperative findings from recent partial pancreatectomy including a 21 cubic cm fluid collection along the pancreatic resection margin which could represent early pseudocyst. 2. Nodularity along the lateral limb of the left  adrenal gland could also be postoperative but merit surveillance, as the pancreatic lesion was in close proximity to this adrenal gland on the prior CT of 11/07/2017. 3. Asymmetric fullness inferiorly in the left  breast. The patient has a history of prior left breast procedures, correlation with mammography is recommended. 4. Other imaging findings of potential clinical significance: Stable hemangioma in the left hepatic lobe. Splenectomy. Aortic Atherosclerosis (ICD10-I70.0). Right hemicolectomy. Small focus of fat necrosis in the right anterior abdominal wall subcutaneous tissues near the laparotomy site. Bilateral nonobstructive nephrolithiasis.   01/02/2018 Tumor Marker   Patient's tumor was tested for the following markers: CA-19-9 Results of the tumor marker test revealed 5   01/03/2018 - 06/05/2018 Chemotherapy   She received modified dose FOLFIRINOX   04/10/2018 Imaging   1. Distal pancreatectomy, without findings of recurrent or metastatic disease. 2. Incompletely imaged hypoenhancement within lower lobe right pulmonary artery branch is likely chronic (but interval since 10/09/2017) pulmonary embolism. Dedicated CTA could further evaluate. 3. Decrease in size of a peripancreatic complex fluid collection anteriorly, likely a resolving pseudocyst. 4. Decreased size of minimal fluid versus a borderline sized node in the gastrohepatic ligament. Recommend attention on follow-up. 5. Hepatic steatosis with a segment 4 hemangioma. 6. Aortic Atherosclerosis (ICD10-I70.0). This is significantly age advanced.   07/12/2018 Tumor Marker   Patient's tumor was tested for the following markers: CA-19-9 Results of the tumor marker test revealed 6   07/12/2018 Imaging   1. Status post distal pancreatectomy with stable postoperative fluid collection adjacent to the ventral aspect of the pancreatic head. No findings to suggest metastatic disease in the abdomen or pelvis. 2. Hepatic steatosis with small  cavernous hemangioma in segment 4A of the liver. 3. Slight decreased size of nonenlarged gastrohepatic ligament lymph node, presumably benign. 4. Aortic atherosclerosis.    10/16/2018 Imaging   Status post distal pancreatectomy. Stable postoperative seroma along the surgical margin.   Status post hysterectomy and right oophorectomy.   Status post right hemicolectomy with appendectomy.   No evidence of recurrent or metastatic disease.   No colonic wall thickening or mass is evident on CT.   01/22/2019 Tumor Marker   Patient's tumor was tested for the following markers: CA-19.9 Results of the tumor marker test revealed 5   01/22/2019 Imaging   1. No evidence of local recurrence or metastatic disease status post distal pancreatectomy and splenectomy. 2. Stable small seroma anterior to the pancreatic head. 3. Postsurgical changes as described. 4. Stable additional incidental findings including a hepatic hemangioma, nonobstructing bilateral renal calculi and aortic Atherosclerosis (ICD10-I70.0).   04/18/2019 Imaging   1. Status post distal pancreatectomy and splenectomy. There has been a gradual increase in ill-defined soft tissue and celiac axis/SMA origin lymph nodes adjacent to the distal pancreatectomy margin and lesser curvature of the stomach/gastric body over sequential prior examinations dated 01/22/2019 and 09/26/2018. An enlarged lymph node or soft tissue nodule adjacent to the SMA origin now measures 1.8 x 0.8 cm. Findings are concerning for local recurrence of pancreatic malignancy. 2. Separately from the above findings, there remains a 1.0 cm low-attenuation nodule anterior to the remnant pancreatic neck, most consistent with postoperative seroma 3. No evidence of distant metastatic disease in the chest, abdomen, or pelvis. 4. Postoperative findings of cholecystectomy, right hemicolectomy, and hysterectomy. 5. Bilateral nonobstructive nephrolithiasis. 6.  Aortic  Atherosclerosis (ICD10-I70.0).       05/28/2019 Tumor Marker   Patient's tumor was tested for the following markers: CA19-9 Results of the tumor marker test revealed 18   05/28/2019 Imaging   1. Status post distal pancreatectomy with continued further progression of the ill-defined soft tissue to the left of the celiac axis/SMA origin, now measuring 1.9 x  1.9 cm and highly concerning for recurrent/metastatic disease. This process abuts both the celiac axis, SMA, and posterior wall of the mid stomach. PET-CT may prove helpful to further evaluate. 2. Bandlike ill-defined soft tissue in the anterior abdomen, potentially peritoneal or omental is similar to perhaps minimally increased qualitatively in the interval. Close attention on follow-up recommended. 3. Left paraumbilical hernia contains a short segment of small bowel without complicating features. 4. Ill-defined very subtle area of decreased attenuation in the posterior aspect of hepatic segment IV. This is likely focal fatty deposition. Close attention on follow-up recommended. 5.  Aortic Atherosclerois (ICD10-170.0)   06/07/2019 Pathology Results   Malignant cells consistent with adenocarcinoma   06/07/2019 Procedure   ENDOSCOPIC FINDING (limited views with linear echoendoscope): :      The examined esophagus was endoscopically normal.      The entire examined stomach was endoscopically normal.      ENDOSONOGRAPHIC FINDING: :      1. An irregular mass was identified in the region of the pancreatic tail resection site. The mass was stellate with very poorly defined borders. Fine needle aspiration for cytology was performed. Color Doppler imaging was utilized prior to needle puncture to confirm a lack of significant vascular structures within the needle path. Four passes were made with the 25 gauge (FNB) needle using a transgastric approach. A cytotechnologist was present to evaluate the adequacy of the specimen. Final cytology results are  pending. Impression:               -  Irregular, stellate soft tissue mass in the region of the pancreatic tail resection site. This was sampled with four transgastric passes with an EUS FNB needle..   06/18/2019 - 08/14/2019 Chemotherapy   The patient had FOLFIRINOX for chemotherapy treatment.     09/03/2019 Imaging   1. Findings are again highly concerning for locally recurrent disease adjacent to the pancreatectomy bed, with enlarging soft tissue mass which is intimately associated with the superior mesenteric artery, celiac axis and superior aspect of the left renal  vein, as detailed above. 2. No evidence of metastatic disease in the thorax. 3. 2.7 x 1.2 cm cavernous hemangioma in segment 4A of the liver again noted. 4. Small nonobstructive calculi in the collecting systems of both kidneys measuring up to 3 mm in the lower pole collecting system of the left kidney. 5. Aortic atherosclerosis. 6. Additional incidental findings, as above.   11/16/2019 Imaging   Similar-appearing ill-defined soft tissue the left of the celiac axis/SMA, most compatible with recurrent/metastatic disease.   Similar ill-defined nodularity within the anterior upper abdomen, potentially peritoneal or omental. Continued attention on follow-up is recommended.   12/31/2019 Tumor Marker   Patient's tumor was tested for the following markers: CA-19-9 Results of the tumor marker test revealed 298   01/10/2020 Imaging   1. Numerous small hypoenhancing liver masses scattered throughout the liver, all new since 11/16/2019 CT, compatible with new liver metastases. 2. Stable upper retroperitoneal mass encasing the celiac axis and abutting the pancreatic resection margin, compatible with stable locally recurrent tumor. 3. Stable subcentimeter indistinct anterior upper peritoneal implants. 4. No evidence of metastatic disease in the chest. 5. Chronic findings include: Nonobstructing left nephrolithiasis. Stable small left  paraumbilical hernia containing a small bowel loop without acute bowel complication. Aortic Atherosclerosis (ICD10-I70.0).     01/19/2020 -  Chemotherapy    Patient is on Treatment Plan: PANCREATIC ABRAXANE / GEMCITABINE D1,8,15 Q28D      01/25/2020 Tumor  Marker   Patient's tumor was tested for the following markers: CA-19-9 Results of the tumor marker test revealed 1104.   03/04/2020 Tumor Marker   Patient's tumor was tested for the following markers: CA-19-9 Results of the tumor marker test revealed 581   04/11/2020 Imaging   1. Essentially stable appearance of the soft tissue density process along the distal pancreatectomy site, abutting the posterior portion of the stomach, celiac trunk, left lateral margin of the SMA, and potentially the superior margin of the left renal vein. This could be inflammatory but residual tumor not excluded. 2. Some of the liver lesions are mildly reduced in size and some of the liver lesions are stable in size. 3. Further reduction in omental nodularity. 4. Small paraumbilical hernia contains a loop of small bowel, without findings of strangulation or obstruction. 5. Small supraumbilical hernias containing adipose tissue. 6. Small nonobstructive left renal calculus.   04/11/2020 Tumor Marker   Patient's tumor was tested for the following markers: CA19-9 Results of the tumor marker test revealed 451   05/27/2020 Imaging   1. Stable chest CT, without acute findings or evidence of metastatic disease. 2. Assessment of the patient's liver disease is limited by progressive geographic hepatic steatosis. There is one central hepatic lesion which appears slightly more conspicuous, and this may be contributing to new intrahepatic biliary dilatation posteriorly in the right lobe. The other hepatic lesions are generally improved. No extrahepatic biliary dilatation. 3. 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  No apparent residual peritoneal disease. 4. Stable postsurgical changes and small hernias of the anterior abdominal wall.   05/28/2020 Tumor Marker   Patient's tumor was tested for the following markers: CA19-9 Results of the tumor marker test revealed 3097   06/24/2020 Tumor Marker   Patient's tumor was tested for the following marker CA19-9. Results of the tumor marker test revealed 4304     REVIEW OF SYSTEMS:   Constitutional: Denies fevers, chills or abnormal weight loss Eyes: Denies blurriness of vision Ears, nose, mouth, throat, and face: Denies mucositis or sore throat Respiratory: Denies cough, dyspnea or wheezes Cardiovascular: Denies palpitation, chest discomfort or lower extremity swelling Gastrointestinal:  Denies nausea, heartburn or change in bowel habits Skin: Denies abnormal skin rashes Lymphatics: Denies new lymphadenopathy or easy bruising Neurological:Denies numbness, tingling or new weaknesses Behavioral/Psych: Mood is stable, no new changes  All other systems were reviewed with the patient and are negative.  I have reviewed the past medical history, past surgical history, social history and family history with the patient and they are unchanged from previous note.  ALLERGIES:  is allergic to codeine, other, penicillins, and bactrim [sulfamethoxazole-trimethoprim].  MEDICATIONS:  Current Outpatient Medications  Medication Sig Dispense Refill  . cetirizine (ZYRTEC) 10 MG tablet Take 10 mg by mouth daily.    . citalopram (CELEXA) 20 MG tablet TAKE 1 TABLET BY MOUTH EVERY DAY 90 tablet 4  . diclofenac Sodium (VOLTAREN) 1 % GEL APPLY 2-4 GRAMS TO AFFECTED JOINT 4 TIMES DAILY AS NEEDED. 400 g 1  . estradiol (ESTRACE VAGINAL) 0.1 MG/GM vaginal cream Place 1 Applicatorful vaginally 3 (three) times a week. 42.5 g 12  . fluticasone (FLONASE) 50 MCG/ACT nasal spray Place 1 spray into both nostrils daily.    . gabapentin (NEURONTIN) 300 MG capsule Take 1 capsule (300 mg  total) by mouth 3 (three) times daily. 90 capsule 11  . HYDROmorphone (DILAUDID) 4 MG tablet Take 1 tablet (4   mg total) by mouth every 4 (four) hours as needed. 60 tablet 0  . hydrOXYzine (ATARAX/VISTARIL) 10 MG tablet Take 1 tablet (10 mg total) by mouth 3 (three) times daily as needed. 30 tablet 1  . lidocaine-prilocaine (EMLA) cream Apply 1 application topically daily as needed (port). Apply to affected area once 30 g 11  . metFORMIN (GLUCOPHAGE) 500 MG tablet Take 2 tablets (1,000 mg total) by mouth 2 (two) times daily with a meal. 180 tablet 11  . methadone (DOLOPHINE) 10 MG tablet Take 1 tablet (10 mg total) by mouth every 12 (twelve) hours. 60 tablet 0  . methocarbamol (ROBAXIN) 500 MG tablet TAKE 1 TABLET BY MOUTH THREE TIMES A DAY AS NEEDED FOR MUSCLE SPASMS 90 tablet 0  . Multiple Vitamin (MULTIVITAMIN WITH MINERALS) TABS tablet Take 1 tablet by mouth daily.    . naproxen (NAPROSYN) 500 MG tablet Take 1 tablet (500 mg total) by mouth 2 (two) times daily with a meal. (Patient taking differently: Take 500 mg by mouth as needed.) 30 tablet 0  . omeprazole (PRILOSEC) 40 MG capsule TAKE 1 CAPSULE BY MOUTH EVERY DAY 90 capsule 3  . ondansetron (ZOFRAN) 8 MG tablet TAKE 1 TAB BY MOUTH EVERY 8HOURS AS NEEDED FOR REFRACTORY NAUSEA/VOMITING START ON DAY 3AFTER CHEMO 8 tablet 7  . prochlorperazine (COMPAZINE) 10 MG tablet TAKE 1 TABLET BY MOUTH EVERY 6 HOURS AS NEEDED 90 tablet 3  . simethicone (MYLICON) 125 MG chewable tablet Chew 125 mg by mouth every 6 (six) hours as needed for flatulence.    . XARELTO 20 MG TABS tablet TAKE 1 TABLET BY MOUTH EVERY DAY WITH SUPPER 90 tablet 3   No current facility-administered medications for this visit.   Facility-Administered Medications Ordered in Other Visits  Medication Dose Route Frequency Provider Last Rate Last Admin  . sodium chloride flush (NS) 0.9 % injection 10 mL  10 mL Intracatheter PRN Jesusita Jocelyn, MD   10 mL at 07/25/20 1432    PHYSICAL  EXAMINATION: ECOG PERFORMANCE STATUS: 1 - Symptomatic but completely ambulatory  Vitals:   07/25/20 1259  BP: (!) 125/95  Pulse: 92  Resp: 18  Temp: 98.1 F (36.7 C)  SpO2: 93%   Filed Weights   07/25/20 1259  Weight: 222 lb 9.6 oz (101 kg)    GENERAL:alert, no distress and comfortable SKIN: skin color, texture, turgor are normal, no rashes or significant lesions EYES: normal, Conjunctiva are pink and non-injected, sclera clear OROPHARYNX:no exudate, no erythema and lips, buccal mucosa, and tongue normal  NECK: supple, thyroid normal size, non-tender, without nodularity LYMPH:  no palpable lymphadenopathy in the cervical, axillary or inguinal LUNGS: clear to auscultation and percussion with normal breathing effort HEART: regular rate & rhythm and no murmurs and no lower extremity edema ABDOMEN:abdomen soft, non-tender and normal bowel sounds Musculoskeletal:no cyanosis of digits and no clubbing  NEURO: alert & oriented x 3 with fluent speech, no focal motor/sensory deficits  LABORATORY DATA:  I have reviewed the data as listed    Component Value Date/Time   NA 137 07/25/2020 1214   K 4.1 07/25/2020 1214   CL 103 07/25/2020 1214   CO2 24 07/25/2020 1214   GLUCOSE 130 (H) 07/25/2020 1214   BUN 11 07/25/2020 1214   CREATININE 0.68 07/25/2020 1214   CREATININE 0.68 07/27/2017 0851   CALCIUM 9.0 07/25/2020 1214   PROT 6.4 (L) 07/25/2020 1214   ALBUMIN 3.2 (L) 07/25/2020 1214   AST 59 (H) 07/25/2020 1214     ALT 58 (H) 07/25/2020 1214   ALKPHOS 332 (H) 07/25/2020 1214   BILITOT 0.5 07/25/2020 1214   GFRNONAA >60 07/25/2020 1214   GFRNONAA 106 07/27/2017 0851   GFRAA >60 11/09/2019 1012   GFRAA >60 09/27/2019 1535   GFRAA 123 07/27/2017 0851    No results found for: SPEP, UPEP  Lab Results  Component Value Date   WBC 9.8 07/25/2020   NEUTROABS 5.7 07/25/2020   HGB 12.1 07/25/2020   HCT 36.3 07/25/2020   MCV 94.0 07/25/2020   PLT 455 (H) 07/25/2020       Chemistry      Component Value Date/Time   NA 137 07/25/2020 1214   K 4.1 07/25/2020 1214   CL 103 07/25/2020 1214   CO2 24 07/25/2020 1214   BUN 11 07/25/2020 1214   CREATININE 0.68 07/25/2020 1214   CREATININE 0.68 07/27/2017 0851      Component Value Date/Time   CALCIUM 9.0 07/25/2020 1214   ALKPHOS 332 (H) 07/25/2020 1214   AST 59 (H) 07/25/2020 1214   ALT 58 (H) 07/25/2020 1214   BILITOT 0.5 07/25/2020 1214       

## 2020-07-25 NOTE — Assessment & Plan Note (Signed)
Since discontinuation of steroids, her blood sugar is better She will continue metformin and lifestyle modification

## 2020-07-25 NOTE — Patient Instructions (Signed)

## 2020-07-26 LAB — CA 125: Cancer Antigen (CA) 125: 49.1 U/mL — ABNORMAL HIGH (ref 0.0–38.1)

## 2020-07-28 DIAGNOSIS — C252 Malignant neoplasm of tail of pancreas: Secondary | ICD-10-CM | POA: Diagnosis not present

## 2020-07-28 DIAGNOSIS — C182 Malignant neoplasm of ascending colon: Secondary | ICD-10-CM | POA: Diagnosis not present

## 2020-07-28 DIAGNOSIS — C541 Malignant neoplasm of endometrium: Secondary | ICD-10-CM | POA: Diagnosis not present

## 2020-07-28 LAB — CANCER ANTIGEN 19-9: CA 19-9: 6501 U/mL — ABNORMAL HIGH (ref 0–35)

## 2020-08-01 DIAGNOSIS — C259 Malignant neoplasm of pancreas, unspecified: Secondary | ICD-10-CM | POA: Diagnosis not present

## 2020-08-01 DIAGNOSIS — F418 Other specified anxiety disorders: Secondary | ICD-10-CM | POA: Diagnosis not present

## 2020-08-01 DIAGNOSIS — M069 Rheumatoid arthritis, unspecified: Secondary | ICD-10-CM | POA: Diagnosis not present

## 2020-08-01 DIAGNOSIS — I700 Atherosclerosis of aorta: Secondary | ICD-10-CM | POA: Diagnosis not present

## 2020-08-05 ENCOUNTER — Inpatient Hospital Stay: Payer: BC Managed Care – PPO

## 2020-08-05 ENCOUNTER — Other Ambulatory Visit: Payer: Self-pay

## 2020-08-05 VITALS — BP 127/87 | HR 66 | Temp 98.3°F | Resp 18

## 2020-08-05 DIAGNOSIS — C541 Malignant neoplasm of endometrium: Secondary | ICD-10-CM

## 2020-08-05 DIAGNOSIS — C7961 Secondary malignant neoplasm of right ovary: Secondary | ICD-10-CM

## 2020-08-05 DIAGNOSIS — Z7189 Other specified counseling: Secondary | ICD-10-CM

## 2020-08-05 DIAGNOSIS — C252 Malignant neoplasm of tail of pancreas: Secondary | ICD-10-CM | POA: Diagnosis not present

## 2020-08-05 DIAGNOSIS — Z5111 Encounter for antineoplastic chemotherapy: Secondary | ICD-10-CM | POA: Diagnosis not present

## 2020-08-05 LAB — CBC WITH DIFFERENTIAL (CANCER CENTER ONLY)
Abs Immature Granulocytes: 0.04 10*3/uL (ref 0.00–0.07)
Basophils Absolute: 0 10*3/uL (ref 0.0–0.1)
Basophils Relative: 1 %
Eosinophils Absolute: 0.2 10*3/uL (ref 0.0–0.5)
Eosinophils Relative: 2 %
HCT: 37 % (ref 36.0–46.0)
Hemoglobin: 12.1 g/dL (ref 12.0–15.0)
Immature Granulocytes: 1 %
Lymphocytes Relative: 28 %
Lymphs Abs: 2.2 10*3/uL (ref 0.7–4.0)
MCH: 31.2 pg (ref 26.0–34.0)
MCHC: 32.7 g/dL (ref 30.0–36.0)
MCV: 95.4 fL (ref 80.0–100.0)
Monocytes Absolute: 1.2 10*3/uL — ABNORMAL HIGH (ref 0.1–1.0)
Monocytes Relative: 16 %
Neutro Abs: 4 10*3/uL (ref 1.7–7.7)
Neutrophils Relative %: 52 %
Platelet Count: 270 10*3/uL (ref 150–400)
RBC: 3.88 MIL/uL (ref 3.87–5.11)
RDW: 19 % — ABNORMAL HIGH (ref 11.5–15.5)
WBC Count: 7.6 10*3/uL (ref 4.0–10.5)
nRBC: 0 % (ref 0.0–0.2)

## 2020-08-05 LAB — CMP (CANCER CENTER ONLY)
ALT: 82 U/L — ABNORMAL HIGH (ref 0–44)
AST: 77 U/L — ABNORMAL HIGH (ref 15–41)
Albumin: 3.1 g/dL — ABNORMAL LOW (ref 3.5–5.0)
Alkaline Phosphatase: 344 U/L — ABNORMAL HIGH (ref 38–126)
Anion gap: 11 (ref 5–15)
BUN: 13 mg/dL (ref 6–20)
CO2: 24 mmol/L (ref 22–32)
Calcium: 8.9 mg/dL (ref 8.9–10.3)
Chloride: 104 mmol/L (ref 98–111)
Creatinine: 0.68 mg/dL (ref 0.44–1.00)
GFR, Estimated: >60 mL/min (ref >60)
Glucose, Bld: 148 mg/dL — ABNORMAL HIGH (ref 70–99)
Potassium: 4.3 mmol/L (ref 3.5–5.1)
Sodium: 139 mmol/L (ref 135–145)
Total Bilirubin: 0.5 mg/dL (ref 0.3–1.2)
Total Protein: 6.3 g/dL — ABNORMAL LOW (ref 6.5–8.1)

## 2020-08-05 MED ORDER — SODIUM CHLORIDE 0.9 % IV SOLN
Freq: Once | INTRAVENOUS | Status: AC
Start: 2020-08-05 — End: 2020-08-05
  Filled 2020-08-05: qty 250

## 2020-08-05 MED ORDER — SODIUM CHLORIDE 0.9 % IV SOLN
600.0000 mg/m2 | Freq: Once | INTRAVENOUS | Status: AC
  Administered 2020-08-05: 1292 mg via INTRAVENOUS
  Filled 2020-08-05: qty 33.98

## 2020-08-05 MED ORDER — HEPARIN SOD (PORK) LOCK FLUSH 100 UNIT/ML IV SOLN
500.0000 [IU] | Freq: Once | INTRAVENOUS | Status: DC | PRN
  Filled 2020-08-05: qty 5

## 2020-08-05 MED ORDER — SODIUM CHLORIDE 0.9% FLUSH
10.0000 mL | Freq: Once | INTRAVENOUS | Status: AC
  Administered 2020-08-05: 10 mL
  Filled 2020-08-05: qty 10

## 2020-08-05 MED ORDER — ONDANSETRON HCL 4 MG/2ML IJ SOLN
INTRAMUSCULAR | Status: AC
  Filled 2020-08-05: qty 4

## 2020-08-05 MED ORDER — ONDANSETRON HCL 4 MG/2ML IJ SOLN
8.0000 mg | Freq: Once | INTRAMUSCULAR | Status: AC
  Administered 2020-08-05: 8 mg via INTRAVENOUS

## 2020-08-05 MED ORDER — SODIUM CHLORIDE 0.9% FLUSH
10.0000 mL | INTRAVENOUS | Status: DC | PRN
  Filled 2020-08-05: qty 10

## 2020-08-05 NOTE — Progress Notes (Signed)
Ok to treat w/ ALT 82

## 2020-08-05 NOTE — Patient Instructions (Signed)
[facility]  Discharge Instructions: Thank you for choosing [facility] to provide your oncology and hematology care.   If you have a lab appointment with the Cancer Center, please go directly to the Cancer Center and check in at the registration area.   Wear comfortable clothing and clothing appropriate for easy access to any Portacath or PICC line.   We strive to give you quality time with your provider. You may need to reschedule your appointment if you arrive late (15 or more minutes).  Arriving late affects you and other patients whose appointments are after yours.  Also, if you miss three or more appointments without notifying the office, you may be dismissed from the clinic at the provider's discretion.      For prescription refill requests, have your pharmacy contact our office and allow 72 hours for refills to be completed.    Today you received the following chemotherapy and/or immunotherapy agents gemzar      To help prevent nausea and vomiting after your treatment, we encourage you to take your nausea medication as directed.  BELOW ARE SYMPTOMS THAT SHOULD BE REPORTED IMMEDIATELY: *FEVER GREATER THAN 100.4 F (38 C) OR HIGHER *CHILLS OR SWEATING *NAUSEA AND VOMITING THAT IS NOT CONTROLLED WITH YOUR NAUSEA MEDICATION *UNUSUAL SHORTNESS OF BREATH *UNUSUAL BRUISING OR BLEEDING *URINARY PROBLEMS (pain or burning when urinating, or frequent urination) *BOWEL PROBLEMS (unusual diarrhea, constipation, pain near the anus) TENDERNESS IN MOUTH AND THROAT WITH OR WITHOUT PRESENCE OF ULCERS (sore throat, sores in mouth, or a toothache) UNUSUAL RASH, SWELLING OR PAIN  UNUSUAL VAGINAL DISCHARGE OR ITCHING   Items with * indicate a potential emergency and should be followed up as soon as possible or go to the Emergency Department if any problems should occur.  Please show the CHEMOTHERAPY ALERT CARD or IMMUNOTHERAPY ALERT CARD at check-in to the  Emergency Department and triage nurse.  Should you have questions after your visit or need to cancel or reschedule your appointment, please contact [facility]  Dept: [phone]  and follow the prompts.  Office hours are 8:00 a.m. to 4:30 p.m. Monday - Friday. Please note that voicemails left after 4:00 p.m. may not be returned until the following business day.  We are closed weekends and major holidays. You have access to a nurse at all times for urgent questions. Please call the main number to the clinic Dept: [phone] and follow the prompts.   For any non-urgent questions, you may also contact your provider using MyChart. We now offer e-Visits for anyone 18 and older to request care online for non-urgent symptoms. For details visit [email or web address].   Also download the MyChart app! Go to the app store, search "MyChart", open the app, select , and log in with your MyChart username and password.  Due to Covid, a mask is required upon entering the hospital/clinic. If you do not have a mask, one will be given to you upon arrival. For doctor visits, patients may have 1 support person aged 18 or older with them. For treatment visits, patients cannot have anyone with them due to current Covid guidelines and our immunocompromised population.   

## 2020-08-19 ENCOUNTER — Inpatient Hospital Stay: Payer: BC Managed Care – PPO

## 2020-08-19 ENCOUNTER — Other Ambulatory Visit: Payer: Self-pay | Admitting: Hematology and Oncology

## 2020-08-19 ENCOUNTER — Telehealth: Payer: Self-pay | Admitting: Hematology and Oncology

## 2020-08-19 ENCOUNTER — Inpatient Hospital Stay (HOSPITAL_BASED_OUTPATIENT_CLINIC_OR_DEPARTMENT_OTHER): Payer: BC Managed Care – PPO | Admitting: Hematology and Oncology

## 2020-08-19 ENCOUNTER — Other Ambulatory Visit: Payer: Self-pay

## 2020-08-19 ENCOUNTER — Encounter: Payer: Self-pay | Admitting: Hematology and Oncology

## 2020-08-19 VITALS — BP 134/96 | HR 91 | Temp 98.5°F | Resp 18 | Ht 62.0 in | Wt 223.0 lb

## 2020-08-19 DIAGNOSIS — G893 Neoplasm related pain (acute) (chronic): Secondary | ICD-10-CM | POA: Diagnosis not present

## 2020-08-19 DIAGNOSIS — C7961 Secondary malignant neoplasm of right ovary: Secondary | ICD-10-CM

## 2020-08-19 DIAGNOSIS — Z7189 Other specified counseling: Secondary | ICD-10-CM

## 2020-08-19 DIAGNOSIS — R748 Abnormal levels of other serum enzymes: Secondary | ICD-10-CM | POA: Diagnosis not present

## 2020-08-19 DIAGNOSIS — T380X5D Adverse effect of glucocorticoids and synthetic analogues, subsequent encounter: Secondary | ICD-10-CM

## 2020-08-19 DIAGNOSIS — C252 Malignant neoplasm of tail of pancreas: Secondary | ICD-10-CM | POA: Diagnosis not present

## 2020-08-19 DIAGNOSIS — C541 Malignant neoplasm of endometrium: Secondary | ICD-10-CM

## 2020-08-19 DIAGNOSIS — Z5111 Encounter for antineoplastic chemotherapy: Secondary | ICD-10-CM | POA: Diagnosis not present

## 2020-08-19 DIAGNOSIS — E099 Drug or chemical induced diabetes mellitus without complications: Secondary | ICD-10-CM

## 2020-08-19 LAB — CBC WITH DIFFERENTIAL (CANCER CENTER ONLY)
Abs Immature Granulocytes: 0.02 10*3/uL (ref 0.00–0.07)
Basophils Absolute: 0.1 10*3/uL (ref 0.0–0.1)
Basophils Relative: 1 %
Eosinophils Absolute: 0.2 10*3/uL (ref 0.0–0.5)
Eosinophils Relative: 3 %
HCT: 38.4 % (ref 36.0–46.0)
Hemoglobin: 12.6 g/dL (ref 12.0–15.0)
Immature Granulocytes: 0 %
Lymphocytes Relative: 27 %
Lymphs Abs: 2.2 10*3/uL (ref 0.7–4.0)
MCH: 31.9 pg (ref 26.0–34.0)
MCHC: 32.8 g/dL (ref 30.0–36.0)
MCV: 97.2 fL (ref 80.0–100.0)
Monocytes Absolute: 1.3 10*3/uL — ABNORMAL HIGH (ref 0.1–1.0)
Monocytes Relative: 16 %
Neutro Abs: 4.4 10*3/uL (ref 1.7–7.7)
Neutrophils Relative %: 53 %
Platelet Count: 421 10*3/uL — ABNORMAL HIGH (ref 150–400)
RBC: 3.95 MIL/uL (ref 3.87–5.11)
RDW: 18.8 % — ABNORMAL HIGH (ref 11.5–15.5)
WBC Count: 8.2 10*3/uL (ref 4.0–10.5)
nRBC: 0 % (ref 0.0–0.2)

## 2020-08-19 LAB — CMP (CANCER CENTER ONLY)
ALT: 109 U/L — ABNORMAL HIGH (ref 0–44)
AST: 96 U/L — ABNORMAL HIGH (ref 15–41)
Albumin: 3 g/dL — ABNORMAL LOW (ref 3.5–5.0)
Alkaline Phosphatase: 319 U/L — ABNORMAL HIGH (ref 38–126)
Anion gap: 10 (ref 5–15)
BUN: 13 mg/dL (ref 6–20)
CO2: 24 mmol/L (ref 22–32)
Calcium: 8.6 mg/dL — ABNORMAL LOW (ref 8.9–10.3)
Chloride: 106 mmol/L (ref 98–111)
Creatinine: 0.7 mg/dL (ref 0.44–1.00)
GFR, Estimated: >60 mL/min (ref >60)
Glucose, Bld: 134 mg/dL — ABNORMAL HIGH (ref 70–99)
Potassium: 4.2 mmol/L (ref 3.5–5.1)
Sodium: 140 mmol/L (ref 135–145)
Total Bilirubin: 0.5 mg/dL (ref 0.3–1.2)
Total Protein: 6.3 g/dL — ABNORMAL LOW (ref 6.5–8.1)

## 2020-08-19 MED ORDER — ONDANSETRON HCL 4 MG/2ML IJ SOLN
INTRAMUSCULAR | Status: AC
  Filled 2020-08-19: qty 4

## 2020-08-19 MED ORDER — SODIUM CHLORIDE 0.9% FLUSH
10.0000 mL | INTRAVENOUS | Status: DC | PRN
  Administered 2020-08-19: 10 mL
  Filled 2020-08-19: qty 10

## 2020-08-19 MED ORDER — SODIUM CHLORIDE 0.9 % IV SOLN
Freq: Once | INTRAVENOUS | Status: AC
  Filled 2020-08-19: qty 250

## 2020-08-19 MED ORDER — SODIUM CHLORIDE 0.9% FLUSH
10.0000 mL | Freq: Once | INTRAVENOUS | Status: AC
  Administered 2020-08-19: 10 mL
  Filled 2020-08-19: qty 10

## 2020-08-19 MED ORDER — SODIUM CHLORIDE 0.9 % IV SOLN
600.0000 mg/m2 | Freq: Once | INTRAVENOUS | Status: AC
  Administered 2020-08-19: 1254 mg via INTRAVENOUS
  Filled 2020-08-19: qty 32.98

## 2020-08-19 MED ORDER — ONDANSETRON HCL 4 MG/2ML IJ SOLN
8.0000 mg | Freq: Once | INTRAMUSCULAR | Status: AC
  Administered 2020-08-19: 8 mg via INTRAVENOUS

## 2020-08-19 MED ORDER — SODIUM CHLORIDE 0.9 % IV SOLN
Freq: Once | INTRAVENOUS | Status: DC
  Filled 2020-08-19: qty 250

## 2020-08-19 MED ORDER — HEPARIN SOD (PORK) LOCK FLUSH 100 UNIT/ML IV SOLN
500.0000 [IU] | Freq: Once | INTRAVENOUS | Status: AC | PRN
  Administered 2020-08-19: 500 [IU]
  Filled 2020-08-19: qty 5

## 2020-08-19 NOTE — Progress Notes (Signed)
[facility] OFFICE PROGRESS NOTE  Patient Care Team: [name], MD as PCP - General (Family Medicine)  ASSESSMENT & PLAN:  Malignant neoplasm of tail of pancreas [facility]) Per previous discussion, we will continue on maintenance gemcitabine every 2 weeks, with dose reduction due to abnormal liver enzymes Her counts are stable Apart from some joint pain which is unrelated and muscle twitches, she tolerated treatment very well I suspect her recent elevated CA 19-9 is due to abnormal liver function For now, we will continue treatment indefinitely Plan to repeat imaging study again in July  Elevated liver enzymes Her profound elevated liver enzymes are not due to cancer progression Significant fatty liver change is seen The patient is able to taper off prednisone completely without difficulties Her liver enzymes are stable She will continue risk factor modification with dietary changes and weight loss  Cancer associated pain She has chronic pain secondary to her disease and inflammatory arthritis I refill her prescription today  Steroid-induced diabetes ([place]) Since discontinuation of steroids, her blood sugar is better She will continue metformin and lifestyle modification  Orders Placed This Encounter  Procedures   CT ABDOMEN PELVIS W CONTRAST    Standing Status:   Future    Standing Expiration Date:   08/19/2021    Order Specific Question:   If indicated for the ordered procedure, I authorize the administration of contrast media per Radiology protocol    Answer:   Yes    Order Specific Question:   Preferred imaging location?    Answer:   [facility]    Order Specific Question:   Radiology Contrast Protocol - do NOT remove file path    Answer:   [email or web address]    Order Specific Question:   Is patient pregnant?    Answer:   No    All questions were answered. The patient knows to call the clinic with any  problems, questions or concerns. The total time spent in the appointment was 30 minutes encounter with patients including review of chart and various tests results, discussions about plan of care and coordination of care plan   Heath Lark, MD 08/19/2020 1:11 PM  INTERVAL HISTORY: Please see below for problem oriented charting. 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 Mixed attenuation mass emanates from the right adnexa measuring 10.8 x 8.0 cm very suspicious for right ovarian carcinoma. 2. Abnormality of the tail of the pancreas may be due to mild pancreatitis and small pseudocyst formation, but neoplasm cannot be excluded. 3. Small amount of ascites within abdomen and pelvis. 4. Small nonobstructing renal calculi bilaterally.      08/30/2017 Pathology Results   1. Uterus and cervix, with left fallopian tube - ENDOMETRIOID ADENOCARCINOMA, FIGO GRADE I, ARISING IN A BACKGROUND OF DIFFUSE COMPLEX ATYPICAL HYPERPLASIA. - CARCINOMA INVADES FOR OF DEPTH OF 0.2 CM WHERE THICKNESS OF MYOMETRIAL WALL IS 2.1 CM. - ALL RESECTION MARGINS ARE NEGATIVE FOR CARCINOMA. - NEGATIVE FOR LYMPHOVASCULAR OR PERINEURAL INVASION. - CERVICAL STROMA IS NOT INVOLVED. -  SEE ONCOLOGY TABLE. - SEE NOTE 2. Ovary and fallopian tube, right - PRIMARY OVARIAN ENDOMETRIOID ADENOCARCINOMA, FIGO GRADE II, 12 CM. - THE OVARIAN SURFACE IS FOCALLY INVOLVED BY CARCINOMA. - NEGATIVE FOR  LYMPHOVASCULAR INVASION. - BENIGN UNREMARKABLE FALLOPIAN TUBE, NEGATIVE FOR CARCINOMA. - SEE ONCOLOGY TABLE. - SEE NOTE 3. Cul-de-sac biopsy - METASTATIC ADENOCARCINOMA, MOST CONSISTENT WITH PRIMARY OVARIAN ENDOMETRIOID ADENOCARCINOMA. 4. Ovary, left - BENIGN UNREMARKABLE OVARY, NEGATIVE FOR MALIGNANCY. Microscopic Comment 1. UTERUS, CARCINOMA OR CARCINOSARCOMA Procedure: Total hysterectomy with bilateral salpingo-oophorectomy. Histologic type: Endometrioid adenocarcinoma. Histologic Grade: FIGO Grade I Myometrial invasion: Depth of invasion: 2 mm Myometrial thickness: 21 mm Uterine Serosa Involvement: Not identified Cervical stromal involvement: Not identified Extent of involvement of other organs: Not applicable Lymphovascular invasion: Not identified Regional Lymph Nodes: Examined: 0 Sentinel 0 Non-sentinel 0 Total Tumor block for ancillary studies: 1H MMR / MSI testing: Pending Pathologic Stage Classification (pTNM, AJCC 8th edition): pT1a, pNX (v4.1.0.0) 2. OVARY or FALLOPIAN TUBE or PRIMARY PERITONEUM: Procedure: Salpingo-oophorectomy Specimen Integrity: Intact Tumor Site: Right ovary Ovarian Surface Involvement (required only if applicable): Focally involved by carcinoma Fallopian Tube Surface Involvement (required only if applicable): Not identified Tumor Size: 12 cm Histologic Type: Endometrioid adenocarcinoma Histologic Grade: Grade II Implants (required for advanced stage serous/seromucinous borderline tumors only): Not applicable Other Tissue/ Organ Involvement: Cul de sac biopsy involved by tumor Largest Extrapelvic Peritoneal Focus (required only if applicable): Not applicable Peritoneal/Ascitic Fluid: Negative for carcinoma (case # [id number]) Treatment Effect (required only for high-grade serous carcinomas): Not applicable Regional Lymph Nodes: No lymph nodes submitted or found Number of Lymph Nodes Examined: 0 Pathologic Stage Classification (pTNM, AJCC  8th Edition): pT2b, pN0 Representative Tumor Block: 2B and 2E 1. Molecular study for microsatellite instability and immunohistochemical stains for MMR-related proteins are pending and will be reported in an addendum. 2. Immunohistochemical stain show that the ovarian tumor is positive for CK7 and PAX8 (both diffuse), CDX2 (patchy and weak); and negative for CK20. This immunoprofile is consistent with the above diagnosis. Dr. Lyndon Code has reviewed this case and concurs with the above diagnosis. Molecular study for microsatellite instability and immunohistochemical stains for MMR-related proteins are pending and will be reported in an addendum    08/30/2017 Genetic Testing   Patient has genetic testing done for MSI  Results revealed patient has the following mutation(s): loss of [facility] 2    08/30/2017 Surgery   Surgeon: Mart Piggs, MD Pre-operative Diagnosis:  Adnexal mass Abnormal uterine bleeding H/o Cecal CA   Post-operative Diagnosis:  Adhesive disease post colon resection Endometrial cancer NOS Adenocarcinoma unknown origin, right ovary, suspicious for GI primary   Operation:  Lysis of adhesions ~30 minutes Robotic-assisted laparoscopic total hysterectomy with right salpingo-oophorectomy and left salpingectomy Left oophorectomy (RA-laparoscopic) Pelvic washings    Findings: Adhesions of omentum to anterior abdominal wall. Enlarged cystic right ovary ~10cm. Uterus had small nodules on serosa near where right adnexa was intimate with the surface. No obvious intraoperative rupture of cyst, although in 2 areas the wall was thin and one of these areas had some bleeding. Slight scarring of left bladder dome to LUS/cervix. Uterus on frozen section c/w hyperplasia and a small focus of endometrial CA - no myo invasion, <2cm in size. Frozen section on the right adnexa was carcinoma, met from colon or possibly Gyn, favor GI primary, defer to permanent. Left ovary was WNL.     09/15/2017 Cancer  Staging   Staging form: Corpus Uteri - Carcinoma and Carcinosarcoma, AJCC 8th Edition -  Pathologic: FIGO Stage IA (pT1a, pN0, cM0) - Signed by Heath Lark, MD on 09/15/2017    09/26/2017 Procedure   Successful placement of a right internal jugular approach power injectable Port-A-Cath. The catheter is ready for immediate use.    11/07/2017 Imaging   1. Since 08/18/2017, similar to slight decrease in size of a pancreatic body/tail junction lesion. Cross modality comparison relative to 09/16/2017 MRI is also grossly similar. 2. No evidence of metastatic disease. 3.  Aortic Atherosclerosis (ICD10-I70.0).  4. Left nephrolithiasis.    11/18/2017 Genetic Testing   MSH2 c.2237dupT pathogenic mutation identified in the CancerNext panel.  The CancerNext gene panel offered by [employer] includes sequencing and rearrangement analysis for the following 34 genes:   APC, ATM, BARD1, BMPR1A, BRCA1, BRCA2, BRIP1, CDH1, CDK4, CDKN2A, CHEK2, DICER1, HOXB13, EPCAM, GREM1, MLH1, MRE11A, MSH2, MSH6, MUTYH, NBN, NF1, PALB2, PMS2, POLD1, POLE, PTEN, RAD50, RAD51C, RAD51D, SMAD4, SMARCA4, STK11, and TP53.  The report date is November 18, 2017.  MSH2 [id number] pathogenic mutation identified on somatic testing.  These results are consistent with a diagnosis of Lynch syndrome.    06/18/2019 - 08/14/2019 Chemotherapy   The patient had FOLFIRINOX for chemotherapy treatment.     01/19/2020 -  Chemotherapy    Patient is on Treatment Plan: PANCREATIC ABRAXANE / GEMCITABINE D1,8,15 Q28D       04/11/2020 Imaging   1. Essentially stable appearance of the soft tissue density process along the distal pancreatectomy site, abutting the posterior portion of the stomach, celiac trunk, left lateral margin of the SMA, and potentially the superior margin of the left renal vein. This could be inflammatory but residual tumor not excluded. 2. Some of the liver lesions are mildly reduced in size and some of the liver  lesions are stable in size. 3. Further reduction in omental nodularity. 4. Small paraumbilical hernia contains a loop of small bowel, without findings of strangulation or obstruction. 5. Small supraumbilical hernias containing adipose tissue. 6. Small nonobstructive left renal calculus.   Secondary malignant neoplasm of right ovary ([place])  08/23/2017 Tumor Marker   Patient's tumor was tested for the following markers: CA-125 Results of the tumor marker test revealed 139.8    08/31/2017 Initial Diagnosis   Secondary malignant neoplasm of right ovary ([place])    09/15/2017 Cancer Staging   Staging form: Ovary, Fallopian Tube, and Primary Peritoneal Carcinoma, AJCC 8th Edition - Pathologic: Stage IIB (pT2b, pN0, cM0) - Signed by Heath Lark, MD on 09/15/2017    09/27/2017 Imaging   No evidence of metastatic disease or other acute findings within the thorax.   4 cm low-attenuation mass in pancreatic tail, highly suspicious for pancreatic carcinoma. This is caused splenic vein thrombosis, with new venous collaterals in the left upper quadrant. Consider endoscopic ultrasound with FNA for tissue diagnosis.   Stable benign hepatic hemangioma.      09/27/2017 Tumor Marker   Patient's tumor was tested for the following markers: CA-125 Results of the tumor marker test revealed 39.4    11/02/2017 Tumor Marker   Patient's tumor was tested for the following markers: CA-125 Results of the tumor marker test revealed 19    11/07/2017 Tumor Marker   Patient's tumor was tested for the following markers: CA-125 Results of the tumor marker test revealed 18.3    11/18/2017 Genetic Testing   MSH2 c.2237dupT pathogenic mutation identified in the CancerNext panel.  The CancerNext gene panel offered by [employer] includes sequencing and rearrangement analysis for the following 34 genes:  APC, ATM, BARD1, BMPR1A, BRCA1, BRCA2, BRIP1, CDH1, CDK4, CDKN2A, CHEK2, DICER1, HOXB13, EPCAM, GREM1, MLH1, MRE11A,  MSH2, MSH6, MUTYH, NBN, NF1, PALB2, PMS2, POLD1, POLE, PTEN, RAD50, RAD51C, RAD51D, SMAD4, SMARCA4, STK11, and TP53.  The report date is November 18, 2017.  MSH2 [id number] pathogenic mutation identified on somatic testing.  These results are consistent with a diagnosis of Lynch syndrome.    12/15/2017 Tumor Marker   Patient's tumor was tested for the following markers: CA-125 Results of the tumor marker test revealed 57.3    01/16/2018 Tumor Marker   Patient's tumor was tested for the following markers: CA-125 Results of the tumor marker test revealed 24.2    04/10/2018 Tumor Marker   Patient's tumor was tested for the following markers: CA-125 Results of the tumor marker test revealed 18.2    07/12/2018 Tumor Marker   Patient's tumor was tested for the following markers: CA-125 Results of the tumor marker test revealed 11.8    10/16/2018 Tumor Marker   Patient's tumor was tested for the following markers: CA125 Results of the tumor marker test revealed 12.4.   01/18/2020 Tumor Marker   Patient's tumor was tested for the following markers: CA-125 Results of the tumor marker test revealed 18   Malignant neoplasm of tail of pancreas ([place])  10/13/2017 Pathology Results   Pancreas tail mass, endoscopic ultrasound-guided, fine needle aspiration II (smears and cell block):      Adenocarcinoma    11/18/2017 Genetic Testing   MSH2 c.2237dupT pathogenic mutation identified in the CancerNext panel.  The CancerNext gene panel offered by [employer] includes sequencing and rearrangement analysis for the following 34 genes:   APC, ATM, BARD1, BMPR1A, BRCA1, BRCA2, BRIP1, CDH1, CDK4, CDKN2A, CHEK2, DICER1, HOXB13, EPCAM, GREM1, MLH1, MRE11A, MSH2, MSH6, MUTYH, NBN, NF1, PALB2, PMS2, POLD1, POLE, PTEN, RAD50, RAD51C, RAD51D, SMAD4, SMARCA4, STK11, and TP53.  The report date is November 18, 2017.  MSH2 [id number] pathogenic mutation identified on somatic  testing.  These results are consistent with a diagnosis of Lynch syndrome.    11/24/2017 Pathology Results   A.  "TAIL OF PANCREAS AND SPLEEN", DISTAL PANCREATECTOMY AND SPLENECTOMY:      Invasive ductal adenocarcinoma, moderately to poorly differentiated with focal signet ring cell features, of pancreas (distal).         The carcinoma is 2.5 cm in greatest dimension grossly.          Treatment effects present in the form of fibrosis (50%).      No lymphovascular or definite perineural invasion identified.      All surgical margins are negative for tumor or high-grade dysplasia.      Adjacent mucinous neoplasm, most consistent with Intraductal papillary mucinous neoplasm (IPMN) with low-grade dysplasia.         Uninvolved pancreas show atrophy and focal acute inflammation.          Twelve benign lymph nodes (0/12).      Spleen with no significant histopathologic abnormalities.  PROCEDURE: distal pancreatectomy and splenectomy TUMOR SITE: distal pancreas TUMOR SIZE:   GREATEST DIMENSION: 2.5  cm   ADDITIONAL DIMENSIONS:   x   cm HISTOLOGIC TYPE: ductal adenocarcinoma HISTOLOGIC GRADE: grade 3 TUMOR EXTENSION: peripancreatic soft tissue MARGINS: negative for tumor TREATMENT EFFECT:  treatment effects present in the form of fibrosis (50%). LYMPHOVASCULAR INVASION: not identified PERINEURAL INVASION: no definite evidence REGIONAL LYMPH NODES:     NUMBER OF LYMPH NODES INVOLVED: 0     NUMBER OF LYMPH NODES EXAMINED:  75 PATHOLOGIC STAGE CLASSIFICATION (pTNM, AJCC 8th Ed): ypT2, ypN0 DISTANT METASTASIS (pM):  pMx ADDITIONAL PATHOLOGIC FINDINGS: mucinous neoplasm, most consistent with intraductal papillary mucinous neoplasm (IPMN) with low-grade dysplasia, is identified adjacent to the main tumor.    11/24/2017 Cancer Staging   Staging form: Exocrine Pancreas, AJCC 8th Edition - Pathologic stage from 11/24/2017: Stage IB (pT2, pN0, cM0) - Signed by Truitt Merle, MD on 01/03/2018     11/25/2017 Surgery   She had surgery at [facility] 1. Exploratory Laparotomy 2. Distal Pancreatectomy and Splenectomy 3. Intraoperative Ultrasound 4. Open Cholecystectomy    12/15/2017 Cancer Staging   Staging form: Exocrine Pancreas, AJCC 8th Edition - Clinical: Stage IB (cT2, cN0, cM0) - Signed by Heath Lark, MD on 12/15/2017    12/28/2017 Imaging   12/28/2017 CT Abdomen   IMPRESSION: 1. Postoperative findings from recent partial pancreatectomy including a 21 cubic cm fluid collection along the pancreatic resection margin which could represent early pseudocyst. 2. Nodularity along the lateral limb of the left adrenal gland could also be postoperative but merit surveillance, as the pancreatic lesion was in close proximity to this adrenal gland on the prior CT of 11/07/2017. 3. Asymmetric fullness inferiorly in the left breast. The patient has a history of prior left breast procedures, correlation with mammography is recommended. 4. Other imaging findings of potential clinical significance: Stable hemangioma in the left hepatic lobe. Splenectomy. Aortic Atherosclerosis (ICD10-I70.0). Right hemicolectomy. Small focus of fat necrosis in the right anterior abdominal wall subcutaneous tissues near the laparotomy site. Bilateral nonobstructive nephrolithiasis.    01/02/2018 Tumor Marker   Patient's tumor was tested for the following markers: CA-19-9 Results of the tumor marker test revealed 5    01/03/2018 - 06/05/2018 Chemotherapy   She received modified dose FOLFIRINOX    04/10/2018 Imaging   1. Distal pancreatectomy, without findings of recurrent or metastatic disease. 2. Incompletely imaged hypoenhancement within lower lobe right pulmonary artery branch is likely chronic (but interval since 10/09/2017) pulmonary embolism. Dedicated CTA could further evaluate. 3. Decrease in size of a peripancreatic complex fluid collection anteriorly, likely a resolving pseudocyst. 4. Decreased size  of minimal fluid versus a borderline sized node in the gastrohepatic ligament. Recommend attention on follow-up. 5. Hepatic steatosis with a segment 4 hemangioma. 6. Aortic Atherosclerosis (ICD10-I70.0). This is significantly age advanced.    07/12/2018 Tumor Marker   Patient's tumor was tested for the following markers: CA-19-9 Results of the tumor marker test revealed 6    07/12/2018 Imaging   1. Status post distal pancreatectomy with stable postoperative fluid collection adjacent to the ventral aspect of the pancreatic head. No findings to suggest metastatic disease in the abdomen or pelvis. 2. Hepatic steatosis with small cavernous hemangioma in segment 4A of the liver. 3. Slight decreased size of nonenlarged gastrohepatic ligament lymph node, presumably benign. 4. Aortic atherosclerosis.      10/16/2018 Imaging   Status post distal pancreatectomy. Stable postoperative seroma along the surgical margin.   Status post hysterectomy and right oophorectomy.   Status post right hemicolectomy with appendectomy.   No evidence of recurrent or metastatic disease.   No colonic wall thickening or mass is evident on CT.   01/22/2019 Tumor Marker   Patient's tumor was tested for the following markers: CA-19.9 Results of the tumor marker test revealed 5   01/22/2019 Imaging   1. No evidence of local recurrence or metastatic disease status post distal pancreatectomy and splenectomy. 2. Stable small seroma anterior to the pancreatic head.  3. Postsurgical changes as described. 4. Stable additional incidental findings including a hepatic hemangioma, nonobstructing bilateral renal calculi and aortic Atherosclerosis (ICD10-I70.0).   04/18/2019 Imaging   1. Status post distal pancreatectomy and splenectomy. There has been a gradual increase in ill-defined soft tissue and celiac axis/SMA origin lymph nodes adjacent to the distal pancreatectomy margin and lesser curvature of the stomach/gastric  body over sequential prior examinations dated 01/22/2019 and 09/26/2018. An enlarged lymph node or soft tissue nodule adjacent to the SMA origin now measures 1.8 x 0.8 cm. Findings are concerning for local recurrence of pancreatic malignancy. 2. Separately from the above findings, there remains a 1.0 cm low-attenuation nodule anterior to the remnant pancreatic neck, most consistent with postoperative seroma 3. No evidence of distant metastatic disease in the chest, abdomen, or pelvis. 4. Postoperative findings of cholecystectomy, right hemicolectomy, and hysterectomy. 5. Bilateral nonobstructive nephrolithiasis. 6.  Aortic Atherosclerosis (ICD10-I70.0).       05/28/2019 Tumor Marker   Patient's tumor was tested for the following markers: CA19-9 Results of the tumor marker test revealed 18   05/28/2019 Imaging   1. Status post distal pancreatectomy with continued further progression of the ill-defined soft tissue to the left of the celiac axis/SMA origin, now measuring 1.9 x 1.9 cm and highly concerning for recurrent/metastatic disease. This process abuts both the celiac axis, SMA, and posterior wall of the mid stomach. PET-CT may prove helpful to further evaluate. 2. Bandlike ill-defined soft tissue in the anterior abdomen, potentially peritoneal or omental is similar to perhaps minimally increased qualitatively in the interval. Close attention on follow-up recommended. 3. Left paraumbilical hernia contains a short segment of small bowel without complicating features. 4. Ill-defined very subtle area of decreased attenuation in the posterior aspect of hepatic segment IV. This is likely focal fatty deposition. Close attention on follow-up recommended. 5.  Aortic Atherosclerois (ICD10-170.0)   06/07/2019 Pathology Results   Malignant cells consistent with adenocarcinoma   06/07/2019 Procedure   ENDOSCOPIC FINDING (limited views with linear echoendoscope): :      The examined esophagus was  endoscopically normal.      The entire examined stomach was endoscopically normal.      ENDOSONOGRAPHIC FINDING: :      1. An irregular mass was identified in the region of the pancreatic tail resection site. The mass was stellate with very poorly defined borders. Fine needle aspiration for cytology was performed. Color Doppler imaging was utilized prior to needle puncture to confirm a lack of significant vascular structures within the needle path. Four passes were made with the 25 gauge (FNB) needle using a transgastric approach. A cytotechnologist was present to evaluate the adequacy of the specimen. Final cytology results are pending. Impression:               -  Irregular, stellate soft tissue mass in the region of the pancreatic tail resection site. This was sampled with four transgastric passes with an EUS FNB needle..   06/18/2019 - 08/14/2019 Chemotherapy   The patient had FOLFIRINOX for chemotherapy treatment.     09/03/2019 Imaging   1. Findings are again highly concerning for locally recurrent disease adjacent to the pancreatectomy bed, with enlarging soft tissue mass which is intimately associated with the superior mesenteric artery, celiac axis and superior aspect of the left renal  vein, as detailed above. 2. No evidence of metastatic disease in the thorax. 3. 2.7 x 1.2 cm cavernous hemangioma in segment 4A of the liver again noted. 4. Small  nonobstructive calculi in the collecting systems of both kidneys measuring up to 3 mm in the lower pole collecting system of the left kidney. 5. Aortic atherosclerosis. 6. Additional incidental findings, as above.   11/16/2019 Imaging   Similar-appearing ill-defined soft tissue the left of the celiac axis/SMA, most compatible with recurrent/metastatic disease.   Similar ill-defined nodularity within the anterior upper abdomen, potentially peritoneal or omental. Continued attention on follow-up is recommended.   12/31/2019 Tumor Marker    Patient's tumor was tested for the following markers: CA-19-9 Results of the tumor marker test revealed 298   01/10/2020 Imaging   1. Numerous small hypoenhancing liver masses scattered throughout the liver, all new since 11/16/2019 CT, compatible with new liver metastases. 2. Stable upper retroperitoneal mass encasing the celiac axis and abutting the pancreatic resection margin, compatible with stable locally recurrent tumor. 3. Stable subcentimeter indistinct anterior upper peritoneal implants. 4. No evidence of metastatic disease in the chest. 5. Chronic findings include: Nonobstructing left nephrolithiasis. Stable small left paraumbilical hernia containing a small bowel loop without acute bowel complication. Aortic Atherosclerosis (ICD10-I70.0).     01/19/2020 -  Chemotherapy    Patient is on Treatment Plan: PANCREATIC ABRAXANE / GEMCITABINE D1,8,15 Q28D       01/25/2020 Tumor Marker   Patient's tumor was tested for the following markers: CA-19-9 Results of the tumor marker test revealed 1104.   03/04/2020 Tumor Marker   Patient's tumor was tested for the following markers: CA-19-9 Results of the tumor marker test revealed 581   04/11/2020 Imaging   1. Essentially stable appearance of the soft tissue density process along the distal pancreatectomy site, abutting the posterior portion of the stomach, celiac trunk, left lateral margin of the SMA, and potentially the superior margin of the left renal vein. This could be inflammatory but residual tumor not excluded. 2. Some of the liver lesions are mildly reduced in size and some of the liver lesions are stable in size. 3. Further reduction in omental nodularity. 4. Small paraumbilical hernia contains a loop of small bowel, without findings of strangulation or obstruction. 5. Small supraumbilical hernias containing adipose tissue. 6. Small nonobstructive left renal calculus.   04/11/2020 Tumor Marker   Patient's tumor was tested for the  following markers: CA19-9 Results of the tumor marker test revealed 451   05/27/2020 Imaging   1. Stable chest CT, without acute findings or evidence of metastatic disease. 2. Assessment of the patient's liver disease is limited by progressive geographic hepatic steatosis. There is one central hepatic lesion which appears slightly more conspicuous, and this may be contributing to new intrahepatic biliary dilatation posteriorly in the right lobe. The other hepatic lesions are generally improved. No extrahepatic biliary dilatation. 3. 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 No apparent residual peritoneal disease. 4. Stable postsurgical changes and small hernias of the anterior abdominal wall.   05/28/2020 Tumor Marker   Patient's tumor was tested for the following markers: CA19-9 Results of the tumor marker test revealed 3097   06/24/2020 Tumor Marker   Patient's tumor was tested for the following marker CA19-9. Results of the tumor marker test revealed 4304   07/25/2020 Tumor Marker   Patient's tumor was tested for the following markers: CA-125 Results of the tumor marker test revealed 49.1   07/28/2020 Tumor Marker   Patient's tumor was tested for the following markers: CA-19-9 Results of the tumor marker test revealed 6501     REVIEW  OF SYSTEMS:   Constitutional: Denies fevers, chills or abnormal weight loss Eyes: Denies blurriness of vision Ears, nose, mouth, throat, and face: Denies mucositis or sore throat Respiratory: Denies cough, dyspnea or wheezes Cardiovascular: Denies palpitation, chest discomfort or lower extremity swelling Gastrointestinal:  Denies nausea, heartburn or change in bowel habits Skin: Denies abnormal skin rashes Lymphatics: Denies new lymphadenopathy or easy bruising Neurological:Denies numbness, tingling or new weaknesses Behavioral/Psych: Mood is stable, no new changes  All other systems were  reviewed with the patient and are negative.  I have reviewed the past medical history, past surgical history, social history and family history with the patient and they are unchanged from previous note.  ALLERGIES:  is allergic to codeine, other, penicillins, and bactrim [sulfamethoxazole-trimethoprim].  MEDICATIONS:  Current Outpatient Medications  Medication Sig Dispense Refill   cetirizine (ZYRTEC) 10 MG tablet Take 10 mg by mouth daily.     citalopram (CELEXA) 20 MG tablet TAKE 1 TABLET BY MOUTH EVERY DAY 90 tablet 4   diclofenac Sodium (VOLTAREN) 1 % GEL APPLY 2-4 GRAMS TO AFFECTED JOINT 4 TIMES DAILY AS NEEDED. 400 g 1   estradiol (ESTRACE VAGINAL) 0.1 MG/GM vaginal cream Place 1 Applicatorful vaginally 3 (three) times a week. 42.5 g 12   fluticasone (FLONASE) 50 MCG/ACT nasal spray Place 1 spray into both nostrils daily.     gabapentin (NEURONTIN) 300 MG capsule TAKE 1 CAPSULE BY MOUTH THREE TIMES A DAY 90 capsule 11   HYDROmorphone (DILAUDID) 4 MG tablet Take 1 tablet (4 mg total) by mouth every 4 (four) hours as needed. 60 tablet 0   hydrOXYzine (ATARAX/VISTARIL) 10 MG tablet Take 1 tablet (10 mg total) by mouth 3 (three) times daily as needed. 30 tablet 1   lidocaine-prilocaine (EMLA) cream Apply 1 application topically daily as needed (port). Apply to affected area once 30 g 11   metFORMIN (GLUCOPHAGE) 500 MG tablet Take 2 tablets (1,000 mg total) by mouth 2 (two) times daily with a meal. 180 tablet 11   methadone (DOLOPHINE) 10 MG tablet Take 1 tablet (10 mg total) by mouth every 12 (twelve) hours. 60 tablet 0   methocarbamol (ROBAXIN) 500 MG tablet TAKE 1 TABLET BY MOUTH THREE TIMES A DAY AS NEEDED FOR MUSCLE SPASMS 90 tablet 0   Multiple Vitamin (MULTIVITAMIN WITH MINERALS) TABS tablet Take 1 tablet by mouth daily.     naproxen (NAPROSYN) 500 MG tablet Take 1 tablet (500 mg total) by mouth 2 (two) times daily with a meal. (Patient taking differently: Take 500 mg by mouth as  needed.) 30 tablet 0   omeprazole (PRILOSEC) 40 MG capsule TAKE 1 CAPSULE BY MOUTH EVERY DAY 90 capsule 3   ondansetron (ZOFRAN) 8 MG tablet TAKE 1 TAB BY MOUTH EVERY 8HOURS AS NEEDED FOR REFRACTORY NAUSEA/VOMITING START ON DAY 3AFTER CHEMO 8 tablet 7   prochlorperazine (COMPAZINE) 10 MG tablet TAKE 1 TABLET BY MOUTH EVERY 6 HOURS AS NEEDED 90 tablet 3   simethicone (MYLICON) 196 MG chewable tablet Chew 125 mg by mouth every 6 (six) hours as needed for flatulence.     XARELTO 20 MG TABS tablet TAKE 1 TABLET BY MOUTH EVERY DAY WITH SUPPER 90 tablet 3   No current facility-administered medications for this visit.    PHYSICAL EXAMINATION: ECOG PERFORMANCE STATUS: 1 - Symptomatic but completely ambulatory  Vitals:   08/19/20 1228  BP: (!) 134/96  Pulse: 91  Resp: 18  Temp: 98.5 F (36.9 C)  SpO2: 98%  Filed Weights   08/19/20 1228  Weight: 223 lb (101.2 kg)    GENERAL:alert, no distress and comfortable NEURO: alert & oriented x 3 with fluent speech, no focal motor/sensory deficits  LABORATORY DATA:  I have reviewed the data as listed    Component Value Date/Time   NA 140 08/19/2020 1212   K 4.2 08/19/2020 1212   CL 106 08/19/2020 1212   CO2 24 08/19/2020 1212   GLUCOSE 134 (H) 08/19/2020 1212   BUN 13 08/19/2020 1212   CREATININE 0.70 08/19/2020 1212   CREATININE 0.68 07/27/2017 0851   CALCIUM 8.6 (L) 08/19/2020 1212   PROT 6.3 (L) 08/19/2020 1212   ALBUMIN 3.0 (L) 08/19/2020 1212   AST 96 (H) 08/19/2020 1212   ALT 109 (H) 08/19/2020 1212   ALKPHOS 319 (H) 08/19/2020 1212   BILITOT 0.5 08/19/2020 1212   GFRNONAA >60 08/19/2020 1212   GFRNONAA 106 07/27/2017 0851   GFRAA >60 11/09/2019 1012   GFRAA >60 09/27/2019 1535   GFRAA 123 07/27/2017 0851    No results found for: SPEP, UPEP  Lab Results  Component Value Date   WBC 8.2 08/19/2020   NEUTROABS 4.4 08/19/2020   HGB 12.6 08/19/2020   HCT 38.4 08/19/2020   MCV 97.2 08/19/2020   PLT 421 (H) 08/19/2020       Chemistry      Component Value Date/Time   NA 140 08/19/2020 1212   K 4.2 08/19/2020 1212   CL 106 08/19/2020 1212   CO2 24 08/19/2020 1212   BUN 13 08/19/2020 1212   CREATININE 0.70 08/19/2020 1212   CREATININE 0.68 07/27/2017 0851      Component Value Date/Time   CALCIUM 8.6 (L) 08/19/2020 1212   ALKPHOS 319 (H) 08/19/2020 1212   AST 96 (H) 08/19/2020 1212   ALT 109 (H) 08/19/2020 1212   BILITOT 0.5 08/19/2020 1212

## 2020-08-19 NOTE — Assessment & Plan Note (Signed)
She has chronic pain secondary to her disease and inflammatory arthritis I refill her prescription today

## 2020-08-19 NOTE — Assessment & Plan Note (Signed)
Per previous discussion, we will continue on maintenance gemcitabine every 2 weeks, with dose reduction due to abnormal liver enzymes Her counts are stable Apart from some joint pain which is unrelated and muscle twitches, she tolerated treatment very well I suspect her recent elevated CA 19-9 is due to abnormal liver function For now, we will continue treatment indefinitely Plan to repeat imaging study again in July

## 2020-08-19 NOTE — Telephone Encounter (Signed)
Scheduled appointment per 06/28 sch msg. Patient is aware. 

## 2020-08-19 NOTE — Assessment & Plan Note (Signed)
Since discontinuation of steroids, her blood sugar is better She will continue metformin and lifestyle modification

## 2020-08-19 NOTE — Assessment & Plan Note (Signed)
Her profound elevated liver enzymes are not due to cancer progression Significant fatty liver change is seen The patient is able to taper off prednisone completely without difficulties Her liver enzymes are stable She will continue risk factor modification with dietary changes and weight loss

## 2020-08-22 ENCOUNTER — Encounter: Payer: Self-pay | Admitting: Hematology and Oncology

## 2020-08-29 ENCOUNTER — Telehealth: Payer: Self-pay

## 2020-08-29 ENCOUNTER — Other Ambulatory Visit: Payer: Self-pay | Admitting: Hematology and Oncology

## 2020-08-29 MED ORDER — HYDROMORPHONE HCL 4 MG PO TABS: 4.0000 mg | ORAL_TABLET | ORAL | 0 refills | Status: DC | PRN

## 2020-08-29 NOTE — Telephone Encounter (Signed)
Pt called requesting refill for Hydromorphone. This request was routed to Dr Alvy Bimler. Order was sent in by MD, pt is aware and verbalizes thanks.

## 2020-08-29 NOTE — Telephone Encounter (Signed)
-----   Message from Heath Lark, MD sent at 08/29/2020  9:23 AM EDT ----- Done ----- Message ----- From: Lennie Odor, LPN Sent: 06/25/6268   9:13 AM EDT To: Heath Lark, MD  Pt called to request refill.

## 2020-09-01 ENCOUNTER — Inpatient Hospital Stay: Payer: BC Managed Care – PPO | Attending: Hematology and Oncology

## 2020-09-01 ENCOUNTER — Other Ambulatory Visit: Payer: Self-pay

## 2020-09-01 ENCOUNTER — Telehealth: Payer: Self-pay

## 2020-09-01 ENCOUNTER — Ambulatory Visit (HOSPITAL_BASED_OUTPATIENT_CLINIC_OR_DEPARTMENT_OTHER)
Admission: RE | Admit: 2020-09-01 | Discharge: 2020-09-01 | Disposition: A | Discharge status: Home / Self Care | Payer: BC Managed Care – PPO | Source: Ambulatory Visit | Attending: Hematology and Oncology | Admitting: Hematology and Oncology

## 2020-09-01 DIAGNOSIS — Z79899 Other long term (current) drug therapy: Secondary | ICD-10-CM | POA: Insufficient documentation

## 2020-09-01 DIAGNOSIS — D1803 Hemangioma of intra-abdominal structures: Secondary | ICD-10-CM | POA: Diagnosis not present

## 2020-09-01 DIAGNOSIS — K7200 Acute and subacute hepatic failure without coma: Secondary | ICD-10-CM | POA: Diagnosis not present

## 2020-09-01 DIAGNOSIS — C7961 Secondary malignant neoplasm of right ovary: Secondary | ICD-10-CM

## 2020-09-01 DIAGNOSIS — I2699 Other pulmonary embolism without acute cor pulmonale: Secondary | ICD-10-CM | POA: Diagnosis not present

## 2020-09-01 DIAGNOSIS — K838 Other specified diseases of biliary tract: Secondary | ICD-10-CM | POA: Diagnosis not present

## 2020-09-01 DIAGNOSIS — Z7189 Other specified counseling: Secondary | ICD-10-CM

## 2020-09-01 DIAGNOSIS — N200 Calculus of kidney: Secondary | ICD-10-CM | POA: Diagnosis not present

## 2020-09-01 DIAGNOSIS — C252 Malignant neoplasm of tail of pancreas: Secondary | ICD-10-CM | POA: Insufficient documentation

## 2020-09-01 DIAGNOSIS — C541 Malignant neoplasm of endometrium: Secondary | ICD-10-CM | POA: Insufficient documentation

## 2020-09-01 DIAGNOSIS — G893 Neoplasm related pain (acute) (chronic): Secondary | ICD-10-CM | POA: Insufficient documentation

## 2020-09-01 LAB — CMP (CANCER CENTER ONLY)
ALT: 249 U/L — ABNORMAL HIGH (ref 0–44)
AST: 175 U/L (ref 15–41)
Albumin: 3.2 g/dL — ABNORMAL LOW (ref 3.5–5.0)
Alkaline Phosphatase: 410 U/L — ABNORMAL HIGH (ref 38–126)
Anion gap: 11 (ref 5–15)
BUN: 11 mg/dL (ref 6–20)
CO2: 26 mmol/L (ref 22–32)
Calcium: 9.3 mg/dL (ref 8.9–10.3)
Chloride: 103 mmol/L (ref 98–111)
Creatinine: 0.7 mg/dL (ref 0.44–1.00)
GFR, Estimated: >60 mL/min (ref >60)
Glucose, Bld: 110 mg/dL — ABNORMAL HIGH (ref 70–99)
Potassium: 4.1 mmol/L (ref 3.5–5.1)
Sodium: 140 mmol/L (ref 135–145)
Total Bilirubin: 3.4 mg/dL — ABNORMAL HIGH (ref 0.3–1.2)
Total Protein: 6.8 g/dL (ref 6.5–8.1)

## 2020-09-01 LAB — CBC WITH DIFFERENTIAL (CANCER CENTER ONLY)
Abs Immature Granulocytes: 0.02 10*3/uL (ref 0.00–0.07)
Basophils Absolute: 0.1 10*3/uL (ref 0.0–0.1)
Basophils Relative: 1 %
Eosinophils Absolute: 0.2 10*3/uL (ref 0.0–0.5)
Eosinophils Relative: 2 %
HCT: 38.4 % (ref 36.0–46.0)
Hemoglobin: 13.1 g/dL (ref 12.0–15.0)
Immature Granulocytes: 0 %
Lymphocytes Relative: 26 %
Lymphs Abs: 1.7 10*3/uL (ref 0.7–4.0)
MCH: 32.8 pg (ref 26.0–34.0)
MCHC: 34.1 g/dL (ref 30.0–36.0)
MCV: 96 fL (ref 80.0–100.0)
Monocytes Absolute: 1.3 10*3/uL — ABNORMAL HIGH (ref 0.1–1.0)
Monocytes Relative: 20 %
Neutro Abs: 3.4 10*3/uL (ref 1.7–7.7)
Neutrophils Relative %: 51 %
Platelet Count: 256 10*3/uL (ref 150–400)
RBC: 4 MIL/uL (ref 3.87–5.11)
RDW: 18.9 % — ABNORMAL HIGH (ref 11.5–15.5)
WBC Count: 6.6 10*3/uL (ref 4.0–10.5)
nRBC: 0 % (ref 0.0–0.2)

## 2020-09-01 MED ORDER — IOHEXOL 300 MG/ML  SOLN
100.0000 mL | Freq: Once | INTRAMUSCULAR | Status: AC | PRN
  Administered 2020-09-01: 80 mL via INTRAVENOUS

## 2020-09-01 MED ORDER — SODIUM CHLORIDE 0.9% FLUSH
10.0000 mL | Freq: Once | INTRAVENOUS | Status: AC
  Administered 2020-09-01: 10 mL
  Filled 2020-09-01: qty 10

## 2020-09-01 NOTE — Progress Notes (Signed)
Ulice Dash from lab called critical AST 175. MD made aware.

## 2020-09-01 NOTE — Telephone Encounter (Signed)
This LPN called pt to review lab results. AST is 175. Encouraged pt to increase fluids. Per MD, she will not have tx tomorrow but she still needs to keep MD appt to review other labs and CT result. Pt verbalized understanding.

## 2020-09-02 ENCOUNTER — Ambulatory Visit: Payer: BC Managed Care – PPO

## 2020-09-02 ENCOUNTER — Inpatient Hospital Stay (HOSPITAL_BASED_OUTPATIENT_CLINIC_OR_DEPARTMENT_OTHER): Payer: BC Managed Care – PPO | Admitting: Hematology and Oncology

## 2020-09-02 ENCOUNTER — Encounter: Payer: Self-pay | Admitting: Hematology and Oncology

## 2020-09-02 DIAGNOSIS — C252 Malignant neoplasm of tail of pancreas: Secondary | ICD-10-CM | POA: Diagnosis not present

## 2020-09-02 DIAGNOSIS — R748 Abnormal levels of other serum enzymes: Secondary | ICD-10-CM | POA: Diagnosis not present

## 2020-09-02 DIAGNOSIS — C541 Malignant neoplasm of endometrium: Secondary | ICD-10-CM | POA: Diagnosis not present

## 2020-09-02 DIAGNOSIS — Z79899 Other long term (current) drug therapy: Secondary | ICD-10-CM | POA: Diagnosis not present

## 2020-09-02 DIAGNOSIS — Z7189 Other specified counseling: Secondary | ICD-10-CM | POA: Diagnosis not present

## 2020-09-02 DIAGNOSIS — G893 Neoplasm related pain (acute) (chronic): Secondary | ICD-10-CM | POA: Diagnosis not present

## 2020-09-02 DIAGNOSIS — I2699 Other pulmonary embolism without acute cor pulmonale: Secondary | ICD-10-CM | POA: Diagnosis not present

## 2020-09-02 DIAGNOSIS — K7200 Acute and subacute hepatic failure without coma: Secondary | ICD-10-CM | POA: Diagnosis not present

## 2020-09-02 LAB — CA 125: Cancer Antigen (CA) 125: 100 U/mL — ABNORMAL HIGH (ref 0.0–38.1)

## 2020-09-02 LAB — CANCER ANTIGEN 19-9: CA 19-9: 12707 U/mL — ABNORMAL HIGH (ref 0–35)

## 2020-09-02 NOTE — Progress Notes (Signed)
DISCONTINUE OFF PATHWAY REGIMEN - Pancreatic Adenocarcinoma   [id number]:Gemcitabine 1,000 mg/m2 IV D1,8,15 + Nab-Paclitaxel 125 mg/m2 IV D1,8,15 q28 Days:   A cycle is every 28 days:     Nab-paclitaxel (protein bound)      Gemcitabine   **Always confirm dose/schedule in your pharmacy ordering system**  REASON: Disease Progression PRIOR TREATMENT: Off Pathway: Gemcitabine 1,000 mg/m2 IV D1,8,15 + Nab-Paclitaxel 125 mg/m2 IV D1,8,15 q28 Days TREATMENT RESPONSE: Progressive Disease (PD)  START ON PATHWAY REGIMEN - Pancreatic Adenocarcinoma     A cycle is every 21 days:     Pembrolizumab   **Always confirm dose/schedule in your pharmacy ordering system**  Patient Characteristics: Metastatic Disease, Third Line and Beyond, MSI-H/dMMR, No Prior PD-1 Inhibitor Therapeutic Status: Metastatic Disease Line of Therapy: Third Engineer, civil (consulting) Status: MSI-H/dMMR Intent of Therapy: Non-Curative / Palliative Intent, Discussed with Patient

## 2020-09-02 NOTE — Progress Notes (Signed)
[place] OFFICE PROGRESS NOTE  Patient Care Team: Dana Pepper, MD as PCP - General (Family Medicine)  ASSESSMENT & PLAN:  Malignant neoplasm of tail of pancreas [facility]) I have reviewed multiple imaging studies with the patient and her husband Unfortunately, she have signs of disease progression causing obstructive jaundice symptoms This is likely the cause of her elevated tumor marker for both CA 19-9 and CA125 The patient has progressed on multiple lines of treatment At this point in time, there is only one treatment left, which is pembrolizumab I have been hesitant to prescribe this due to her previous autoimmune condition We discussed the risk, benefits, side effects of pembrolizumab and she is in agreement to proceed She would like to complete her trip to the beach with family before starting treatment on July 28 In the meantime, I recommend aggressive fluid hydration as tolerated and I have removed a lot of unnecessary medication of her list She is aware treatment goal is palliative  Elevated liver enzymes This is due to metastatic cancer to the liver We will continue aggressive supportive care and hydration I will add 1 L of IV fluids to be given along with her pembrolizumab treatment and bring her within the week of treatment to recheck her labs  Goals of care, counseling/discussion We had numerous goals of care discussions in the past She is aware that her prognosis is not good   Orders Placed This Encounter  Procedures   CBC with Differential ([place] Only)    Standing Status:   Standing    Number of Occurrences:   20    Standing Expiration Date:   09/02/2021   CMP ([place] only)    Standing Status:   Standing    Number of Occurrences:   20    Standing Expiration Date:   09/02/2021   T4    Standing Status:   Standing    Number of Occurrences:   20    Standing Expiration Date:   09/02/2021   TSH    Standing Status:   Standing    Number of  Occurrences:   20    Standing Expiration Date:   09/02/2021    All questions were answered. The patient knows to call the clinic with any problems, questions or concerns. The total time spent in the appointment was 40 minutes encounter with patients including review of chart and various tests results, discussions about plan of care and coordination of care plan   Dana Lark, MD 09/02/2020 12:59 PM  INTERVAL HISTORY: Please see below for problem oriented charting. 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 Mixed attenuation mass emanates from the right adnexa measuring 10.8 x 8.0 cm very suspicious for right ovarian carcinoma. 2. Abnormality of the tail of the pancreas may be due to mild pancreatitis and small pseudocyst formation, but neoplasm cannot be excluded. 3. Small amount of ascites within abdomen and pelvis. 4. Small nonobstructing renal calculi bilaterally.      08/30/2017 Pathology Results   1. Uterus and cervix, with left fallopian tube - ENDOMETRIOID ADENOCARCINOMA, FIGO GRADE I, ARISING IN A BACKGROUND OF DIFFUSE COMPLEX ATYPICAL  HYPERPLASIA. - CARCINOMA INVADES FOR OF DEPTH OF 0.2 CM WHERE THICKNESS OF MYOMETRIAL WALL IS 2.1 CM. - ALL RESECTION MARGINS ARE NEGATIVE FOR CARCINOMA. - NEGATIVE FOR LYMPHOVASCULAR OR PERINEURAL INVASION. - CERVICAL STROMA IS NOT INVOLVED. - SEE ONCOLOGY TABLE. - SEE NOTE 2. Ovary and fallopian tube, right - PRIMARY OVARIAN ENDOMETRIOID ADENOCARCINOMA, FIGO GRADE II, 12 CM. - THE OVARIAN SURFACE IS FOCALLY INVOLVED BY CARCINOMA. - NEGATIVE FOR LYMPHOVASCULAR INVASION. -  BENIGN UNREMARKABLE FALLOPIAN TUBE, NEGATIVE FOR CARCINOMA. - SEE ONCOLOGY TABLE. - SEE NOTE 3. Cul-de-sac biopsy - METASTATIC ADENOCARCINOMA, MOST CONSISTENT WITH PRIMARY OVARIAN ENDOMETRIOID ADENOCARCINOMA. 4. Ovary, left - BENIGN UNREMARKABLE OVARY, NEGATIVE FOR MALIGNANCY. Microscopic Comment 1. UTERUS, CARCINOMA OR CARCINOSARCOMA Procedure: Total hysterectomy with bilateral salpingo-oophorectomy. Histologic type: Endometrioid adenocarcinoma. Histologic Grade: FIGO Grade I Myometrial invasion: Depth of invasion: 2 mm Myometrial thickness: 21 mm Uterine Serosa Involvement: Not identified Cervical stromal involvement: Not identified Extent of involvement of other organs: Not applicable Lymphovascular invasion: Not identified Regional Lymph Nodes: Examined: 0 Sentinel 0 Non-sentinel 0 Total Tumor block for ancillary studies: 1H MMR / MSI testing: Pending Pathologic Stage Classification (pTNM, AJCC 8th edition): pT1a, pNX (v4.1.0.0) 2. OVARY or FALLOPIAN TUBE or PRIMARY PERITONEUM: Procedure: Salpingo-oophorectomy Specimen Integrity: Intact Tumor Site: Right ovary Ovarian Surface Involvement (required only if applicable): Focally involved by carcinoma Fallopian Tube Surface Involvement (required only if applicable): Not identified Tumor Size: 12 cm Histologic Type: Endometrioid adenocarcinoma Histologic Grade: Grade II Implants (required for advanced stage serous/seromucinous borderline tumors only): Not applicable Other Tissue/ Organ Involvement: Cul de sac biopsy involved by tumor Largest Extrapelvic Peritoneal Focus (required only if applicable): Not applicable Peritoneal/Ascitic Fluid: Negative for carcinoma (case # [id number]) Treatment Effect (required only for high-grade serous carcinomas): Not applicable Regional Lymph Nodes: No lymph nodes submitted or found Number of Lymph Nodes Examined: 0 Pathologic Stage Classification (pTNM, AJCC 8th Edition): pT2b,  pN0 Representative Tumor Block: 2B and 2E 1. Molecular study for microsatellite instability and immunohistochemical stains for MMR-related proteins are pending and will be reported in an addendum. 2. Immunohistochemical stain show that the ovarian tumor is positive for CK7 and PAX8 (both diffuse), CDX2 (patchy and weak); and negative for CK20. This immunoprofile is consistent with the above diagnosis. Dr. Lyndon Code has reviewed this case and concurs with the above diagnosis. Molecular study for microsatellite instability and immunohistochemical stains for MMR-related proteins are pending and will be reported in an addendum    08/30/2017 Genetic Testing   Patient has genetic testing done for MSI  Results revealed patient has the following mutation(s): loss of [facility] 2    08/30/2017 Surgery   Surgeon: Mart Piggs, MD Pre-operative Diagnosis:  Adnexal mass Abnormal uterine bleeding H/o Cecal CA   Post-operative Diagnosis:  Adhesive disease post colon resection Endometrial cancer NOS Adenocarcinoma unknown origin, right ovary, suspicious for GI primary   Operation:  Lysis of adhesions ~30 minutes Robotic-assisted laparoscopic total hysterectomy with right salpingo-oophorectomy and left salpingectomy Left oophorectomy (RA-laparoscopic) Pelvic washings    Findings: Adhesions of omentum to anterior abdominal wall. Enlarged cystic right ovary ~10cm. Uterus had small nodules on serosa near where right adnexa was intimate with the surface. No obvious intraoperative rupture of cyst, although in 2 areas the wall was thin and one of these areas had some bleeding. Slight scarring of left bladder dome to LUS/cervix. Uterus on frozen section c/w hyperplasia and a small focus of endometrial CA - no myo invasion, <2cm in size. Frozen section on the right  adnexa was carcinoma, met from colon or possibly Gyn, favor GI primary, defer to permanent. Left ovary was WNL.     09/15/2017 Cancer Staging   Staging  form: Corpus Uteri - Carcinoma and Carcinosarcoma, AJCC 8th Edition - Pathologic: FIGO Stage IA (pT1a, pN0, cM0) - Signed by Dana Lark, MD on 09/15/2017    09/26/2017 Procedure   Successful placement of a right internal jugular approach power injectable Port-A-Cath. The catheter is ready for immediate use.    11/07/2017 Imaging   1. Since 08/18/2017, similar to slight decrease in size of a pancreatic body/tail junction lesion. Cross modality comparison relative to 09/16/2017 MRI is also grossly similar. 2. No evidence of metastatic disease. 3.  Aortic Atherosclerosis (ICD10-I70.0).  4. Left nephrolithiasis.    11/18/2017 Genetic Testing   MSH2 c.2237dupT pathogenic mutation identified in the CancerNext panel.  The CancerNext gene panel offered by [employer] includes sequencing and rearrangement analysis for the following 34 genes:   APC, ATM, BARD1, BMPR1A, BRCA1, BRCA2, BRIP1, CDH1, CDK4, CDKN2A, CHEK2, DICER1, HOXB13, EPCAM, GREM1, MLH1, MRE11A, MSH2, MSH6, MUTYH, NBN, NF1, PALB2, PMS2, POLD1, POLE, PTEN, RAD50, RAD51C, RAD51D, SMAD4, SMARCA4, STK11, and TP53.  The report date is November 18, 2017.  MSH2 [id number] pathogenic mutation identified on somatic testing.  These results are consistent with a diagnosis of Lynch syndrome.    06/18/2019 - 08/14/2019 Chemotherapy   The patient had FOLFIRINOX for chemotherapy treatment.     01/19/2020 - 08/19/2020 Chemotherapy          04/11/2020 Imaging   1. Essentially stable appearance of the soft tissue density process along the distal pancreatectomy site, abutting the posterior portion of the stomach, celiac trunk, left lateral margin of the SMA, and potentially the superior margin of the left renal vein. This could be inflammatory but residual tumor not excluded. 2. Some of the liver lesions are mildly reduced in size and some of the liver lesions are stable in size. 3. Further reduction in omental nodularity. 4. Small  paraumbilical hernia contains a loop of small bowel, without findings of strangulation or obstruction. 5. Small supraumbilical hernias containing adipose tissue. 6. Small nonobstructive left renal calculus.   09/18/2020 -  Chemotherapy    Patient is on Treatment Plan: PANCREATIC MSI-H/DMMR PEMBROLIZUMAB Q21D       Secondary malignant neoplasm of right ovary ([place])  08/23/2017 Tumor Marker   Patient's tumor was tested for the following markers: CA-125 Results of the tumor marker test revealed 139.8    08/31/2017 Initial Diagnosis   Secondary malignant neoplasm of right ovary ([place])    09/15/2017 Cancer Staging   Staging form: Ovary, Fallopian Tube, and Primary Peritoneal Carcinoma, AJCC 8th Edition - Pathologic: Stage IIB (pT2b, pN0, cM0) - Signed by Dana Lark, MD on 09/15/2017    09/27/2017 Imaging   No evidence of metastatic disease or other acute findings within the thorax.   4 cm low-attenuation mass in pancreatic tail, highly suspicious for pancreatic carcinoma. This is caused splenic vein thrombosis, with new venous collaterals in the left upper quadrant. Consider endoscopic ultrasound with FNA for tissue diagnosis.   Stable benign hepatic hemangioma.      09/27/2017 Tumor Marker   Patient's tumor was tested for the following markers: CA-125 Results of the tumor marker test revealed 39.4    11/02/2017 Tumor Marker   Patient's tumor was tested for the following markers: CA-125 Results of the tumor marker test revealed 19    11/07/2017 Tumor Marker   Patient's  tumor was tested for the following markers: CA-125 Results of the tumor marker test revealed 18.3    11/18/2017 Genetic Testing   MSH2 c.2237dupT pathogenic mutation identified in the CancerNext panel.  The CancerNext gene panel offered by [employer] includes sequencing and rearrangement analysis for the following 34 genes:   APC, ATM, BARD1, BMPR1A, BRCA1, BRCA2, BRIP1, CDH1, CDK4, CDKN2A, CHEK2, DICER1, HOXB13,  EPCAM, GREM1, MLH1, MRE11A, MSH2, MSH6, MUTYH, NBN, NF1, PALB2, PMS2, POLD1, POLE, PTEN, RAD50, RAD51C, RAD51D, SMAD4, SMARCA4, STK11, and TP53.  The report date is November 18, 2017.  MSH2 [id number] pathogenic mutation identified on somatic testing.  These results are consistent with a diagnosis of Lynch syndrome.    12/15/2017 Tumor Marker   Patient's tumor was tested for the following markers: CA-125 Results of the tumor marker test revealed 57.3    01/16/2018 Tumor Marker   Patient's tumor was tested for the following markers: CA-125 Results of the tumor marker test revealed 24.2    04/10/2018 Tumor Marker   Patient's tumor was tested for the following markers: CA-125 Results of the tumor marker test revealed 18.2    07/12/2018 Tumor Marker   Patient's tumor was tested for the following markers: CA-125 Results of the tumor marker test revealed 11.8    10/16/2018 Tumor Marker   Patient's tumor was tested for the following markers: CA125 Results of the tumor marker test revealed 12.4.   01/18/2020 Tumor Marker   Patient's tumor was tested for the following markers: CA-125 Results of the tumor marker test revealed 18   Malignant neoplasm of tail of pancreas ([place])  10/13/2017 Pathology Results   Pancreas tail mass, endoscopic ultrasound-guided, fine needle aspiration II (smears and cell block):      Adenocarcinoma    11/18/2017 Genetic Testing   MSH2 c.2237dupT pathogenic mutation identified in the CancerNext panel.  The CancerNext gene panel offered by [employer] includes sequencing and rearrangement analysis for the following 34 genes:   APC, ATM, BARD1, BMPR1A, BRCA1, BRCA2, BRIP1, CDH1, CDK4, CDKN2A, CHEK2, DICER1, HOXB13, EPCAM, GREM1, MLH1, MRE11A, MSH2, MSH6, MUTYH, NBN, NF1, PALB2, PMS2, POLD1, POLE, PTEN, RAD50, RAD51C, RAD51D, SMAD4, SMARCA4, STK11, and TP53.  The report date is November 18, 2017.  MSH2 [id number] pathogenic mutation  identified on somatic testing.  These results are consistent with a diagnosis of Lynch syndrome.    11/24/2017 Pathology Results   A.  "TAIL OF PANCREAS AND SPLEEN", DISTAL PANCREATECTOMY AND SPLENECTOMY:      Invasive ductal adenocarcinoma, moderately to poorly differentiated with focal signet ring cell features, of pancreas (distal).         The carcinoma is 2.5 cm in greatest dimension grossly.          Treatment effects present in the form of fibrosis (50%).      No lymphovascular or definite perineural invasion identified.      All surgical margins are negative for tumor or high-grade dysplasia.      Adjacent mucinous neoplasm, most consistent with Intraductal papillary mucinous neoplasm (IPMN) with low-grade dysplasia.         Uninvolved pancreas show atrophy and focal acute inflammation.          Twelve benign lymph nodes (0/12).      Spleen with no significant histopathologic abnormalities.  PROCEDURE: distal pancreatectomy and splenectomy TUMOR SITE: distal pancreas TUMOR SIZE:   GREATEST DIMENSION: 2.5  cm   ADDITIONAL DIMENSIONS:   x   cm HISTOLOGIC TYPE: ductal adenocarcinoma HISTOLOGIC GRADE:  grade 3 TUMOR EXTENSION: peripancreatic soft tissue MARGINS: negative for tumor TREATMENT EFFECT:  treatment effects present in the form of fibrosis (50%). LYMPHOVASCULAR INVASION: not identified PERINEURAL INVASION: no definite evidence REGIONAL LYMPH NODES:     NUMBER OF LYMPH NODES INVOLVED: 0     NUMBER OF LYMPH NODES EXAMINED: 12 PATHOLOGIC STAGE CLASSIFICATION (pTNM, AJCC 8th Ed): ypT2, ypN0 DISTANT METASTASIS (pM):  pMx ADDITIONAL PATHOLOGIC FINDINGS: mucinous neoplasm, most consistent with intraductal papillary mucinous neoplasm (IPMN) with low-grade dysplasia, is identified adjacent to the main tumor.    11/24/2017 Cancer Staging   Staging form: Exocrine Pancreas, AJCC 8th Edition - Pathologic stage from 11/24/2017: Stage IB (pT2, pN0, cM0) - Signed by Truitt Merle, MD on  01/03/2018    11/25/2017 Surgery   She had surgery at [facility] 1. Exploratory Laparotomy 2. Distal Pancreatectomy and Splenectomy 3. Intraoperative Ultrasound 4. Open Cholecystectomy    12/15/2017 Cancer Staging   Staging form: Exocrine Pancreas, AJCC 8th Edition - Clinical: Stage IB (cT2, cN0, cM0) - Signed by Dana Lark, MD on 12/15/2017    12/28/2017 Imaging   12/28/2017 CT Abdomen   IMPRESSION: 1. Postoperative findings from recent partial pancreatectomy including a 21 cubic cm fluid collection along the pancreatic resection margin which could represent early pseudocyst. 2. Nodularity along the lateral limb of the left adrenal gland could also be postoperative but merit surveillance, as the pancreatic lesion was in close proximity to this adrenal gland on the prior CT of 11/07/2017. 3. Asymmetric fullness inferiorly in the left breast. The patient has a history of prior left breast procedures, correlation with mammography is recommended. 4. Other imaging findings of potential clinical significance: Stable hemangioma in the left hepatic lobe. Splenectomy. Aortic Atherosclerosis (ICD10-I70.0). Right hemicolectomy. Small focus of fat necrosis in the right anterior abdominal wall subcutaneous tissues near the laparotomy site. Bilateral nonobstructive nephrolithiasis.    01/02/2018 Tumor Marker   Patient's tumor was tested for the following markers: CA-19-9 Results of the tumor marker test revealed 5    01/03/2018 - 06/05/2018 Chemotherapy   She received modified dose FOLFIRINOX    04/10/2018 Imaging   1. Distal pancreatectomy, without findings of recurrent or metastatic disease. 2. Incompletely imaged hypoenhancement within lower lobe right pulmonary artery branch is likely chronic (but interval since 10/09/2017) pulmonary embolism. Dedicated CTA could further evaluate. 3. Decrease in size of a peripancreatic complex fluid collection anteriorly, likely a resolving pseudocyst. 4.  Decreased size of minimal fluid versus a borderline sized node in the gastrohepatic ligament. Recommend attention on follow-up. 5. Hepatic steatosis with a segment 4 hemangioma. 6. Aortic Atherosclerosis (ICD10-I70.0). This is significantly age advanced.    07/12/2018 Tumor Marker   Patient's tumor was tested for the following markers: CA-19-9 Results of the tumor marker test revealed 6    07/12/2018 Imaging   1. Status post distal pancreatectomy with stable postoperative fluid collection adjacent to the ventral aspect of the pancreatic head. No findings to suggest metastatic disease in the abdomen or pelvis. 2. Hepatic steatosis with small cavernous hemangioma in segment 4A of the liver. 3. Slight decreased size of nonenlarged gastrohepatic ligament lymph node, presumably benign. 4. Aortic atherosclerosis.      10/16/2018 Imaging   Status post distal pancreatectomy. Stable postoperative seroma along the surgical margin.   Status post hysterectomy and right oophorectomy.   Status post right hemicolectomy with appendectomy.   No evidence of recurrent or metastatic disease.   No colonic wall thickening or mass is evident on CT.  01/22/2019 Tumor Marker   Patient's tumor was tested for the following markers: CA-19.9 Results of the tumor marker test revealed 5   01/22/2019 Imaging   1. No evidence of local recurrence or metastatic disease status post distal pancreatectomy and splenectomy. 2. Stable small seroma anterior to the pancreatic head. 3. Postsurgical changes as described. 4. Stable additional incidental findings including a hepatic hemangioma, nonobstructing bilateral renal calculi and aortic Atherosclerosis (ICD10-I70.0).   04/18/2019 Imaging   1. Status post distal pancreatectomy and splenectomy. There has been a gradual increase in ill-defined soft tissue and celiac axis/SMA origin lymph nodes adjacent to the distal pancreatectomy margin and lesser curvature of the  stomach/gastric body over sequential prior examinations dated 01/22/2019 and 09/26/2018. An enlarged lymph node or soft tissue nodule adjacent to the SMA origin now measures 1.8 x 0.8 cm. Findings are concerning for local recurrence of pancreatic malignancy. 2. Separately from the above findings, there remains a 1.0 cm low-attenuation nodule anterior to the remnant pancreatic neck, most consistent with postoperative seroma 3. No evidence of distant metastatic disease in the chest, abdomen, or pelvis. 4. Postoperative findings of cholecystectomy, right hemicolectomy, and hysterectomy. 5. Bilateral nonobstructive nephrolithiasis. 6.  Aortic Atherosclerosis (ICD10-I70.0).       05/28/2019 Tumor Marker   Patient's tumor was tested for the following markers: CA19-9 Results of the tumor marker test revealed 18   05/28/2019 Imaging   1. Status post distal pancreatectomy with continued further progression of the ill-defined soft tissue to the left of the celiac axis/SMA origin, now measuring 1.9 x 1.9 cm and highly concerning for recurrent/metastatic disease. This process abuts both the celiac axis, SMA, and posterior wall of the mid stomach. PET-CT may prove helpful to further evaluate. 2. Bandlike ill-defined soft tissue in the anterior abdomen, potentially peritoneal or omental is similar to perhaps minimally increased qualitatively in the interval. Close attention on follow-up recommended. 3. Left paraumbilical hernia contains a short segment of small bowel without complicating features. 4. Ill-defined very subtle area of decreased attenuation in the posterior aspect of hepatic segment IV. This is likely focal fatty deposition. Close attention on follow-up recommended. 5.  Aortic Atherosclerois (ICD10-170.0)   06/07/2019 Pathology Results   Malignant cells consistent with adenocarcinoma   06/07/2019 Procedure   ENDOSCOPIC FINDING (limited views with linear echoendoscope): :      The examined esophagus  was endoscopically normal.      The entire examined stomach was endoscopically normal.      ENDOSONOGRAPHIC FINDING: :      1. An irregular mass was identified in the region of the pancreatic tail resection site. The mass was stellate with very poorly defined borders. Fine needle aspiration for cytology was performed. Color Doppler imaging was utilized prior to needle puncture to confirm a lack of significant vascular structures within the needle path. Four passes were made with the 25 gauge (FNB) needle using a transgastric approach. A cytotechnologist was present to evaluate the adequacy of the specimen. Final cytology results are pending. Impression:               -  Irregular, stellate soft tissue mass in the region of the pancreatic tail resection site. This was sampled with four transgastric passes with an EUS FNB needle..   06/18/2019 - 08/14/2019 Chemotherapy   The patient had FOLFIRINOX for chemotherapy treatment.     09/03/2019 Imaging   1. Findings are again highly concerning for locally recurrent disease adjacent to the pancreatectomy bed, with enlarging  soft tissue mass which is intimately associated with the superior mesenteric artery, celiac axis and superior aspect of the left renal  vein, as detailed above. 2. No evidence of metastatic disease in the thorax. 3. 2.7 x 1.2 cm cavernous hemangioma in segment 4A of the liver again noted. 4. Small nonobstructive calculi in the collecting systems of both kidneys measuring up to 3 mm in the lower pole collecting system of the left kidney. 5. Aortic atherosclerosis. 6. Additional incidental findings, as above.   11/16/2019 Imaging   Similar-appearing ill-defined soft tissue the left of the celiac axis/SMA, most compatible with recurrent/metastatic disease.   Similar ill-defined nodularity within the anterior upper abdomen, potentially peritoneal or omental. Continued attention on follow-up is recommended.   12/31/2019 Tumor Marker    Patient's tumor was tested for the following markers: CA-19-9 Results of the tumor marker test revealed 298   01/10/2020 Imaging   1. Numerous small hypoenhancing liver masses scattered throughout the liver, all new since 11/16/2019 CT, compatible with new liver metastases. 2. Stable upper retroperitoneal mass encasing the celiac axis and abutting the pancreatic resection margin, compatible with stable locally recurrent tumor. 3. Stable subcentimeter indistinct anterior upper peritoneal implants. 4. No evidence of metastatic disease in the chest. 5. Chronic findings include: Nonobstructing left nephrolithiasis. Stable small left paraumbilical hernia containing a small bowel loop without acute bowel complication. Aortic Atherosclerosis (ICD10-I70.0).     01/19/2020 - 08/19/2020 Chemotherapy          01/25/2020 Tumor Marker   Patient's tumor was tested for the following markers: CA-19-9 Results of the tumor marker test revealed 1104.   03/04/2020 Tumor Marker   Patient's tumor was tested for the following markers: CA-19-9 Results of the tumor marker test revealed 581   04/11/2020 Imaging   1. Essentially stable appearance of the soft tissue density process along the distal pancreatectomy site, abutting the posterior portion of the stomach, celiac trunk, left lateral margin of the SMA, and potentially the superior margin of the left renal vein. This could be inflammatory but residual tumor not excluded. 2. Some of the liver lesions are mildly reduced in size and some of the liver lesions are stable in size. 3. Further reduction in omental nodularity. 4. Small paraumbilical hernia contains a loop of small bowel, without findings of strangulation or obstruction. 5. Small supraumbilical hernias containing adipose tissue. 6. Small nonobstructive left renal calculus.   04/11/2020 Tumor Marker   Patient's tumor was tested for the following markers: CA19-9 Results of the tumor marker test  revealed 451   05/27/2020 Imaging   1. Stable chest CT, without acute findings or evidence of metastatic disease. 2. Assessment of the patient's liver disease is limited by progressive geographic hepatic steatosis. There is one central hepatic lesion which appears slightly more conspicuous, and this may be contributing to new intrahepatic biliary dilatation posteriorly in the right lobe. The other hepatic lesions are generally improved. No extrahepatic biliary dilatation. 3. 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 No apparent residual peritoneal disease. 4. Stable postsurgical changes and small hernias of the anterior abdominal wall.   05/28/2020 Tumor Marker   Patient's tumor was tested for the following markers: CA19-9 Results of the tumor marker test revealed 3097   06/24/2020 Tumor Marker   Patient's tumor was tested for the following marker CA19-9. Results of the tumor marker test revealed 4304   07/25/2020 Tumor Marker   Patient's tumor was tested for  the following markers: CA-125 Results of the tumor marker test revealed 49.1   07/28/2020 Tumor Marker   Patient's tumor was tested for the following markers: CA-19-9 Results of the tumor marker test revealed 6501   09/01/2020 Imaging   IMPRESSION: 1. Interval progression of several low-density lesions in the liver with new tiny low-density liver lesions evident on today's study. Features are concerning for metastatic disease. PET-CT may prove helpful to further evaluate (see below). The mild posterior right intrahepatic biliary duct dilatation is stable. 2. Stable appearance of the soft tissue attenuation along the distal pancreatectomy resection bed, between the stomach and the celiac axis/SMA. There is some subtle soft tissue attenuation to the left of the SMA, new/more prominent today than on the prior study. Attention on follow-up recommended. PET-CT may prove helpful to assess  this region. 3. Stable appearance of the cavernous hemangioma in the anterior right liver. 4. Tiny nonobstructing left renal stone.   09/18/2020 -  Chemotherapy    Patient is on Treatment Plan: PANCREATIC MSI-H/DMMR PEMBROLIZUMAB Q21D         REVIEW OF SYSTEMS:   Constitutional: Denies fevers, chills or abnormal weight loss Eyes: Denies blurriness of vision Ears, nose, mouth, throat, and face: Denies mucositis or sore throat Respiratory: Denies cough, dyspnea or wheezes Cardiovascular: Denies palpitation, chest discomfort or lower extremity swelling Skin: Denies abnormal skin rashes Lymphatics: Denies new lymphadenopathy or easy bruising Neurological:Denies numbness, tingling or new weaknesses Behavioral/Psych: Mood is stable, no new changes  All other systems were reviewed with the patient and are negative.  I have reviewed the past medical history, past surgical history, social history and family history with the patient and they are unchanged from previous note.  ALLERGIES:  is allergic to codeine, other, penicillins, and bactrim [sulfamethoxazole-trimethoprim].  MEDICATIONS:  Current Outpatient Medications  Medication Sig Dispense Refill   cetirizine (ZYRTEC) 10 MG tablet Take 10 mg by mouth daily.     citalopram (CELEXA) 20 MG tablet TAKE 1 TABLET BY MOUTH EVERY DAY 90 tablet 4   diclofenac Sodium (VOLTAREN) 1 % GEL APPLY 2-4 GRAMS TO AFFECTED JOINT 4 TIMES DAILY AS NEEDED. 400 g 1   estradiol (ESTRACE VAGINAL) 0.1 MG/GM vaginal cream Place 1 Applicatorful vaginally 3 (three) times a week. 42.5 g 12   fluticasone (FLONASE) 50 MCG/ACT nasal spray Place 1 spray into both nostrils daily.     HYDROmorphone (DILAUDID) 4 MG tablet Take 1 tablet (4 mg total) by mouth every 4 (four) hours as needed. 60 tablet 0   lidocaine-prilocaine (EMLA) cream Apply 1 application topically daily as needed (port). Apply to affected area once 30 g 11   metFORMIN (GLUCOPHAGE) 500 MG tablet Take 2  tablets (1,000 mg total) by mouth 2 (two) times daily with a meal. 180 tablet 11   methadone (DOLOPHINE) 10 MG tablet Take 1 tablet (10 mg total) by mouth every 12 (twelve) hours. 60 tablet 0   Multiple Vitamin (MULTIVITAMIN WITH MINERALS) TABS tablet Take 1 tablet by mouth daily.     naproxen (NAPROSYN) 500 MG tablet Take 1 tablet (500 mg total) by mouth 2 (two) times daily with a meal. (Patient taking differently: Take 500 mg by mouth as needed.) 30 tablet 0   omeprazole (PRILOSEC) 40 MG capsule TAKE 1 CAPSULE BY MOUTH EVERY DAY 90 capsule 3   ondansetron (ZOFRAN) 8 MG tablet TAKE 1 TAB BY MOUTH EVERY 8HOURS AS NEEDED FOR REFRACTORY NAUSEA/VOMITING START ON DAY 3AFTER CHEMO 8 tablet 7  prochlorperazine (COMPAZINE) 10 MG tablet TAKE 1 TABLET BY MOUTH EVERY 6 HOURS AS NEEDED 90 tablet 3   simethicone (MYLICON) 323 MG chewable tablet Chew 125 mg by mouth every 6 (six) hours as needed for flatulence.     XARELTO 20 MG TABS tablet TAKE 1 TABLET BY MOUTH EVERY DAY WITH SUPPER 90 tablet 3   No current facility-administered medications for this visit.    PHYSICAL EXAMINATION: ECOG PERFORMANCE STATUS: 1 - Symptomatic but completely ambulatory GENERAL:alert, no distress and comfortable NEURO: alert & oriented x 3 with fluent speech, no focal motor/sensory deficits  LABORATORY DATA:  I have reviewed the data as listed    Component Value Date/Time   NA 140 09/01/2020 0914   K 4.1 09/01/2020 0914   CL 103 09/01/2020 0914   CO2 26 09/01/2020 0914   GLUCOSE 110 (H) 09/01/2020 0914   BUN 11 09/01/2020 0914   CREATININE 0.70 09/01/2020 0914   CREATININE 0.68 07/27/2017 0851   CALCIUM 9.3 09/01/2020 0914   PROT 6.8 09/01/2020 0914   ALBUMIN 3.2 (L) 09/01/2020 0914   AST 175 (HH) 09/01/2020 0914   ALT 249 (H) 09/01/2020 0914   ALKPHOS 410 (H) 09/01/2020 0914   BILITOT 3.4 (H) 09/01/2020 0914   GFRNONAA >60 09/01/2020 0914   GFRNONAA 106 07/27/2017 0851   GFRAA >60 11/09/2019 1012   GFRAA >60  09/27/2019 1535   GFRAA 123 07/27/2017 0851    No results found for: SPEP, UPEP  Lab Results  Component Value Date   WBC 6.6 09/01/2020   NEUTROABS 3.4 09/01/2020   HGB 13.1 09/01/2020   HCT 38.4 09/01/2020   MCV 96.0 09/01/2020   PLT 256 09/01/2020      Chemistry      Component Value Date/Time   NA 140 09/01/2020 0914   K 4.1 09/01/2020 0914   CL 103 09/01/2020 0914   CO2 26 09/01/2020 0914   BUN 11 09/01/2020 0914   CREATININE 0.70 09/01/2020 0914   CREATININE 0.68 07/27/2017 0851      Component Value Date/Time   CALCIUM 9.3 09/01/2020 0914   ALKPHOS 410 (H) 09/01/2020 0914   AST 175 (HH) 09/01/2020 0914   ALT 249 (H) 09/01/2020 0914   BILITOT 3.4 (H) 09/01/2020 0914       RADIOGRAPHIC STUDIES: I have reviewed CT imaging with the patient and her husband I have personally reviewed the radiological images as listed and agreed with the findings in the report. CT ABDOMEN PELVIS W CONTRAST  Result Date: 09/01/2020 CLINICAL DATA:  Pancreatic cancer.  Restaging. EXAM: CT ABDOMEN AND PELVIS WITH CONTRAST TECHNIQUE: Multidetector CT imaging of the abdomen and pelvis was performed using the standard protocol following bolus administration of intravenous contrast. CONTRAST:  63m OMNIPAQUE IOHEXOL 300 MG/ML  SOLN COMPARISON:  06/03/2020 FINDINGS: Lower chest: Unremarkable. Hepatobiliary: Geographic fatty deposition again noted in the liver parenchyma. Cavernous hemangioma in the anterior right liver is stable. 18 mm low-density lesion posterior segment IV appears increased from 8 mm previously. 11 mm low-density lesion noted inferior right liver (22/7) not definitely seen on the prior exam. Tiny rim enhancing lesion posterior segment 3 (19/7) measures 10 mm today and was not visible on the prior study. Additional tiny hypoattenuating lesions in the left liver, both visible on 11/07) were not seen previously. Gallbladder surgically absent. Mild prominence of the right intrahepatic bile  ducts is stable. Extrahepatic common duct is nondilated. Pancreas: Status post distal pancreatectomy. Soft tissue attenuation along the resection bed,  between the stomach and the celiac axis/SMA is not substantially changed in the interval. There is some subtle soft tissue attenuation to the left of the SMA (19/2), more prominent today than on the prior study. Spleen: Splenectomy. Adrenals/Urinary Tract: No adrenal nodule or mass. Right kidney unremarkable. 1-2 mm nonobstructing stone noted lower interpolar left kidney. No evidence for hydroureter. The urinary bladder appears normal for the degree of distention. Stomach/Bowel: Stomach is unremarkable. No gastric wall thickening. No evidence of outlet obstruction. Duodenum is normally positioned as is the ligament of Treitz. No small bowel wall thickening. No small bowel dilatation. Status post right hemicolectomy. No gross colonic mass. No colonic wall thickening. Vascular/Lymphatic: No abdominal aortic aneurysm. No abdominal lymphadenopathy. No pelvic sidewall lymphadenopathy. Reproductive: Uterus surgically absent.  There is no adnexal mass. Other: No intraperitoneal free fluid. Musculoskeletal: Small supraumbilical midline ventral hernia is mildly progressive in the interval with a portion of the anterior wall of the transverse colon bulging into the defect. No complicating features. Small left paraumbilical hernia is stable without complication. No worrisome lytic or sclerotic osseous abnormality. IMPRESSION: 1. Interval progression of several low-density lesions in the liver with new tiny low-density liver lesions evident on today's study. Features are concerning for metastatic disease. PET-CT may prove helpful to further evaluate (see below). The mild posterior right intrahepatic biliary duct dilatation is stable. 2. Stable appearance of the soft tissue attenuation along the distal pancreatectomy resection bed, between the stomach and the celiac axis/SMA.  There is some subtle soft tissue attenuation to the left of the SMA, new/more prominent today than on the prior study. Attention on follow-up recommended. PET-CT may prove helpful to assess this region. 3. Stable appearance of the cavernous hemangioma in the anterior right liver. 4. Tiny nonobstructing left renal stone. Electronically Signed   By: Misty Stanley M.D.   On: 09/01/2020 13:56

## 2020-09-02 NOTE — Assessment & Plan Note (Signed)
We had numerous goals of care discussions in the past She is aware that her prognosis is not good

## 2020-09-02 NOTE — Assessment & Plan Note (Addendum)
This is due to metastatic cancer to the liver We will continue aggressive supportive care and hydration I will add 1 L of IV fluids to be given along with her pembrolizumab treatment and bring her within the week of treatment to recheck her labs

## 2020-09-02 NOTE — Assessment & Plan Note (Signed)
I have reviewed multiple imaging studies with the patient and her husband Unfortunately, she have signs of disease progression causing obstructive jaundice symptoms This is likely the cause of her elevated tumor marker for both CA 19-9 and CA125 The patient has progressed on multiple lines of treatment At this point in time, there is only one treatment left, which is pembrolizumab I have been hesitant to prescribe this due to her previous autoimmune condition We discussed the risk, benefits, side effects of pembrolizumab and she is in agreement to proceed She would like to complete her trip to the beach with family before starting treatment on July 28 In the meantime, I recommend aggressive fluid hydration as tolerated and I have removed a lot of unnecessary medication of her list She is aware treatment goal is palliative

## 2020-09-04 ENCOUNTER — Telehealth: Payer: Self-pay

## 2020-09-04 NOTE — Telephone Encounter (Signed)
Pt called to have MD review medications.   Previously recommendations were to decrease Celexa to 1/2 tablet, and discontinue hydroxyzine.  Pt requesting to continue on the medications due to history of anxiety and depression.   MD recommendations for patient to continue, and we will continue to monitor.  Patient verbalized understanding and agreement.   All questions were answered, no further needs at this time.

## 2020-09-04 NOTE — Telephone Encounter (Signed)
Erroneous entry

## 2020-09-05 ENCOUNTER — Other Ambulatory Visit: Payer: Self-pay | Admitting: Hematology and Oncology

## 2020-09-05 DIAGNOSIS — C7961 Secondary malignant neoplasm of right ovary: Secondary | ICD-10-CM

## 2020-09-05 DIAGNOSIS — C541 Malignant neoplasm of endometrium: Secondary | ICD-10-CM

## 2020-09-08 ENCOUNTER — Encounter: Payer: Self-pay | Admitting: Hematology and Oncology

## 2020-09-09 ENCOUNTER — Other Ambulatory Visit: Payer: Self-pay | Admitting: Hematology and Oncology

## 2020-09-09 ENCOUNTER — Telehealth: Payer: Self-pay

## 2020-09-09 MED ORDER — METHADONE HCL 10 MG PO TABS: 10.0000 mg | ORAL_TABLET | Freq: Two times a day (BID) | ORAL | 0 refills | Status: DC

## 2020-09-09 NOTE — Telephone Encounter (Signed)
Called and given below message. She verbalized understanding. 

## 2020-09-09 NOTE — Telephone Encounter (Signed)
Yes I have refilled her methadone

## 2020-09-09 NOTE — Telephone Encounter (Signed)
She called and left a message. She is having increased back pain. She increased the Dilaudid from 4 mg to 8 mg. With the increase she is having itching. She is taking benadryl prn for the itching.  She wants to make sure it is okay for the increase in Dilaudid and okay to take the benadryl. She is requesting a refill on the Methadone Rx.

## 2020-09-11 NOTE — Progress Notes (Signed)
Pharmacist Chemotherapy Monitoring - Initial Assessment    Anticipated start date: 09/18/20   The following has been reviewed per standard work regarding the patient's treatment regimen: The patient's diagnosis, treatment plan and drug doses, and organ/hematologic function Lab orders and baseline tests specific to treatment regimen  The treatment plan start date, drug sequencing, and pre-medications Prior authorization status  Patient's documented medication list, including drug-drug interaction screen and prescriptions for anti-emetics and supportive care specific to the treatment regimen The drug concentrations, fluid compatibility, administration routes, and timing of the medications to be used The patient's access for treatment and lifetime cumulative dose history, if applicable  The patient's medication allergies and previous infusion related reactions, if applicable   Changes made to treatment plan:  N/A  Follow up needed:  Pending authorization for treatment     Kennith Center, Pharm.D., CPP 09/11/2020@1 :08 PM

## 2020-09-17 ENCOUNTER — Other Ambulatory Visit (HOSPITAL_COMMUNITY): Payer: Self-pay | Admitting: Hematology and Oncology

## 2020-09-17 ENCOUNTER — Telehealth: Payer: Self-pay

## 2020-09-17 MED ORDER — HYDROMORPHONE HCL 8 MG PO TABS: 8.0000 mg | ORAL_TABLET | ORAL | 0 refills | Status: DC | PRN

## 2020-09-17 NOTE — Telephone Encounter (Signed)
Patient notified - no further needs.

## 2020-09-17 NOTE — Telephone Encounter (Signed)
done

## 2020-09-17 NOTE — Telephone Encounter (Signed)
Pt called requesting refill on Dilaudid.    Last Rx on 7/8    Per Pt per your direction, patient has been taking Dilaudid '8mg'$ .  Pt requesting to have refilled as '8mg'$  if agreeable.

## 2020-09-18 ENCOUNTER — Encounter: Payer: Self-pay | Admitting: Hematology and Oncology

## 2020-09-18 ENCOUNTER — Other Ambulatory Visit: Payer: Self-pay

## 2020-09-18 ENCOUNTER — Telehealth: Payer: Self-pay

## 2020-09-18 ENCOUNTER — Inpatient Hospital Stay: Payer: BC Managed Care – PPO

## 2020-09-18 ENCOUNTER — Inpatient Hospital Stay (HOSPITAL_BASED_OUTPATIENT_CLINIC_OR_DEPARTMENT_OTHER): Payer: BC Managed Care – PPO | Admitting: Hematology and Oncology

## 2020-09-18 VITALS — BP 147/108 | HR 96 | Temp 98.9°F | Resp 18

## 2020-09-18 DIAGNOSIS — I2699 Other pulmonary embolism without acute cor pulmonale: Secondary | ICD-10-CM

## 2020-09-18 DIAGNOSIS — C541 Malignant neoplasm of endometrium: Secondary | ICD-10-CM

## 2020-09-18 DIAGNOSIS — Z7189 Other specified counseling: Secondary | ICD-10-CM

## 2020-09-18 DIAGNOSIS — C7961 Secondary malignant neoplasm of right ovary: Secondary | ICD-10-CM

## 2020-09-18 DIAGNOSIS — C252 Malignant neoplasm of tail of pancreas: Secondary | ICD-10-CM | POA: Diagnosis not present

## 2020-09-18 DIAGNOSIS — Z79899 Other long term (current) drug therapy: Secondary | ICD-10-CM | POA: Diagnosis not present

## 2020-09-18 DIAGNOSIS — G893 Neoplasm related pain (acute) (chronic): Secondary | ICD-10-CM | POA: Diagnosis not present

## 2020-09-18 DIAGNOSIS — K7200 Acute and subacute hepatic failure without coma: Secondary | ICD-10-CM | POA: Diagnosis not present

## 2020-09-18 DIAGNOSIS — K7290 Hepatic failure, unspecified without coma: Secondary | ICD-10-CM | POA: Insufficient documentation

## 2020-09-18 LAB — CBC WITH DIFFERENTIAL (CANCER CENTER ONLY)
Abs Immature Granulocytes: 0.11 10*3/uL — ABNORMAL HIGH (ref 0.00–0.07)
Basophils Absolute: 0.1 10*3/uL (ref 0.0–0.1)
Basophils Relative: 1 %
Eosinophils Absolute: 0.1 10*3/uL (ref 0.0–0.5)
Eosinophils Relative: 1 %
HCT: 36.7 % (ref 36.0–46.0)
Hemoglobin: 11.9 g/dL — ABNORMAL LOW (ref 12.0–15.0)
Immature Granulocytes: 1 %
Lymphocytes Relative: 16 %
Lymphs Abs: 1.8 10*3/uL (ref 0.7–4.0)
MCH: 32.8 pg (ref 26.0–34.0)
MCHC: 32.4 g/dL (ref 30.0–36.0)
MCV: 101.1 fL — ABNORMAL HIGH (ref 80.0–100.0)
Monocytes Absolute: 1.2 10*3/uL — ABNORMAL HIGH (ref 0.1–1.0)
Monocytes Relative: 10 %
Neutro Abs: 8.3 10*3/uL — ABNORMAL HIGH (ref 1.7–7.7)
Neutrophils Relative %: 71 %
Platelet Count: 263 10*3/uL (ref 150–400)
RBC: 3.63 MIL/uL — ABNORMAL LOW (ref 3.87–5.11)
RDW: 17.8 % — ABNORMAL HIGH (ref 11.5–15.5)
WBC Count: 11.5 10*3/uL — ABNORMAL HIGH (ref 4.0–10.5)
nRBC: 0 % (ref 0.0–0.2)

## 2020-09-18 LAB — CMP (CANCER CENTER ONLY)
ALT: 233 U/L — ABNORMAL HIGH (ref 0–44)
AST: 234 U/L (ref 15–41)
Albumin: 2.4 g/dL — ABNORMAL LOW (ref 3.5–5.0)
Alkaline Phosphatase: 654 U/L — ABNORMAL HIGH (ref 38–126)
Anion gap: 9 (ref 5–15)
BUN: 9 mg/dL (ref 6–20)
CO2: 26 mmol/L (ref 22–32)
Calcium: 8.7 mg/dL — ABNORMAL LOW (ref 8.9–10.3)
Chloride: 99 mmol/L (ref 98–111)
Creatinine: 0.7 mg/dL (ref 0.44–1.00)
GFR, Estimated: >60 mL/min (ref >60)
Glucose, Bld: 147 mg/dL — ABNORMAL HIGH (ref 70–99)
Potassium: 3.6 mmol/L (ref 3.5–5.1)
Sodium: 134 mmol/L — ABNORMAL LOW (ref 135–145)
Total Bilirubin: 14.6 mg/dL (ref 0.3–1.2)
Total Protein: 5.9 g/dL — ABNORMAL LOW (ref 6.5–8.1)

## 2020-09-18 MED ORDER — SODIUM CHLORIDE 0.9 % IV SOLN
Freq: Once | INTRAVENOUS | Status: AC
  Filled 2020-09-18: qty 250

## 2020-09-18 MED ORDER — SODIUM CHLORIDE 0.9% FLUSH
10.0000 mL | Freq: Once | INTRAVENOUS | Status: AC
  Administered 2020-09-18: 10 mL
  Filled 2020-09-18: qty 10

## 2020-09-18 MED ORDER — SODIUM CHLORIDE 0.9% FLUSH
10.0000 mL | Freq: Once | INTRAVENOUS | Status: AC | PRN
  Administered 2020-09-18: 10 mL
  Filled 2020-09-18: qty 10

## 2020-09-18 MED ORDER — HEPARIN SOD (PORK) LOCK FLUSH 100 UNIT/ML IV SOLN
500.0000 [IU] | Freq: Once | INTRAVENOUS | Status: AC | PRN
  Administered 2020-09-18: 500 [IU]
  Filled 2020-09-18: qty 5

## 2020-09-18 NOTE — Assessment & Plan Note (Signed)
Her pain is well managed with recent increased dose of Dilaudid

## 2020-09-18 NOTE — Progress Notes (Signed)
Lft and bilirubin remain very elevated, provider made aware. Chemo to be held today. Dr. Alvy Bimler to speak with patient.

## 2020-09-18 NOTE — Progress Notes (Signed)
[place] OFFICE PROGRESS NOTE  Patient Care Team: [name], MD as PCP - General (Family Medicine)  ASSESSMENT & PLAN:  Malignant neoplasm of tail of pancreas [facility]) Unfortunately, she is developing acute liver failure We discussed the risk and benefits of treatment and she has made informed decision to forego treatment I will provide IV fluid support I recommend hospice referral   Liver failure without hepatic coma ([place]) She has significant, acute progressive liver failure The patient has noted itching and deep jaundice She has made informed decision not to pursue treatment We discussed what to expect with progressive liver failure I recommend the patient to start anticoagulation therapy due to high risk of bleeding  Pulmonary embolism ([place]) I recommend discontinuation of Xarelto in anticipation she might be coagulopathy with liver failure  Goals of care, counseling/discussion We have extensive discussions about goals of care in the past The patient has made informed decision to pursue hospice at this point We discussed prognosis, likely less than 3 months We will make referral to palliative care/hospice service I gave her a DNR order  Cancer associated pain Her pain is well managed with recent increased dose of Dilaudid  No orders of the defined types were placed in this encounter.   All questions were answered. The patient knows to call the clinic with any problems, questions or concerns. The total time spent in the appointment was 30 minutes encounter with patients including review of chart and various tests results, discussions about plan of care and coordination of care plan   Heath Lark, MD 09/18/2020 3:23 PM  INTERVAL HISTORY: Please see below for problem oriented charting. 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 Mixed attenuation mass emanates from the right adnexa measuring 10.8 x 8.0 cm very suspicious for right ovarian carcinoma. 2. Abnormality of the tail of the pancreas may be due to mild pancreatitis and small pseudocyst formation, but neoplasm cannot be excluded. 3. Small amount of ascites within abdomen and pelvis. 4. Small nonobstructing renal calculi bilaterally.      08/30/2017 Pathology Results   1. Uterus and cervix, with left fallopian tube - ENDOMETRIOID ADENOCARCINOMA, FIGO GRADE I, ARISING IN A BACKGROUND OF DIFFUSE COMPLEX ATYPICAL HYPERPLASIA. - CARCINOMA INVADES FOR OF DEPTH OF 0.2 CM WHERE THICKNESS OF MYOMETRIAL WALL IS 2.1 CM. - ALL RESECTION MARGINS ARE NEGATIVE FOR CARCINOMA. - NEGATIVE FOR LYMPHOVASCULAR OR PERINEURAL INVASION. - CERVICAL STROMA IS NOT INVOLVED. - SEE ONCOLOGY TABLE. - SEE NOTE 2. Ovary and fallopian tube, right - PRIMARY OVARIAN ENDOMETRIOID ADENOCARCINOMA, FIGO GRADE II, 12 CM. - THE OVARIAN SURFACE IS FOCALLY INVOLVED BY CARCINOMA. - NEGATIVE FOR LYMPHOVASCULAR INVASION. - BENIGN UNREMARKABLE FALLOPIAN TUBE, NEGATIVE FOR CARCINOMA. - SEE ONCOLOGY TABLE. - SEE NOTE 3. Cul-de-sac biopsy - METASTATIC ADENOCARCINOMA, MOST CONSISTENT WITH PRIMARY OVARIAN ENDOMETRIOID ADENOCARCINOMA. 4. Ovary, left - BENIGN UNREMARKABLE  OVARY, NEGATIVE FOR MALIGNANCY. Microscopic Comment 1. UTERUS, CARCINOMA OR CARCINOSARCOMA Procedure: Total  hysterectomy with bilateral salpingo-oophorectomy. Histologic type: Endometrioid adenocarcinoma. Histologic Grade: FIGO Grade I Myometrial invasion: Depth of invasion: 2 mm Myometrial thickness: 21 mm Uterine Serosa Involvement: Not identified Cervical stromal involvement: Not identified Extent of involvement of other organs: Not applicable Lymphovascular invasion: Not identified Regional Lymph Nodes: Examined: 0 Sentinel 0 Non-sentinel 0 Total Tumor block for ancillary studies: 1H MMR / MSI testing: Pending Pathologic Stage Classification (pTNM, AJCC 8th edition): pT1a, pNX (v4.1.0.0) 2. OVARY or FALLOPIAN TUBE or PRIMARY PERITONEUM: Procedure: Salpingo-oophorectomy Specimen Integrity: Intact Tumor Site: Right ovary Ovarian Surface Involvement (required only if applicable): Focally involved by carcinoma Fallopian Tube Surface Involvement (required only if applicable): Not identified Tumor Size: 12 cm Histologic Type: Endometrioid adenocarcinoma Histologic Grade: Grade II Implants (required for advanced stage serous/seromucinous borderline tumors only): Not applicable Other Tissue/ Organ Involvement: Cul de sac biopsy involved by tumor Largest Extrapelvic Peritoneal Focus (required only if applicable): Not applicable Peritoneal/Ascitic Fluid: Negative for carcinoma (case # [id number]) Treatment Effect (required only for high-grade serous carcinomas): Not applicable Regional Lymph Nodes: No lymph nodes submitted or found Number of Lymph Nodes Examined: 0 Pathologic Stage Classification (pTNM, AJCC 8th Edition): pT2b, pN0 Representative Tumor Block: 2B and 2E 1. Molecular study for microsatellite instability and immunohistochemical stains for MMR-related proteins are pending and will be reported in an addendum. 2. Immunohistochemical stain show that the ovarian tumor is positive for CK7 and PAX8 (both diffuse), CDX2 (patchy and weak); and negative for CK20. This immunoprofile is  consistent with the above diagnosis. Dr. Lyndon Code has reviewed this case and concurs with the above diagnosis. Molecular study for microsatellite instability and immunohistochemical stains for MMR-related proteins are pending and will be reported in an addendum    08/30/2017 Genetic Testing   Patient has genetic testing done for MSI  Results revealed patient has the following mutation(s): loss of [facility] 2    08/30/2017 Surgery   Surgeon: Mart Piggs, MD Pre-operative Diagnosis:  Adnexal mass Abnormal uterine bleeding H/o Cecal CA   Post-operative Diagnosis:  Adhesive disease post colon resection Endometrial cancer NOS Adenocarcinoma unknown origin, right ovary, suspicious for GI primary   Operation:  Lysis of adhesions ~30 minutes Robotic-assisted laparoscopic total hysterectomy with right salpingo-oophorectomy and left salpingectomy Left oophorectomy (RA-laparoscopic) Pelvic washings    Findings: Adhesions of omentum to anterior abdominal wall. Enlarged cystic right ovary ~10cm. Uterus had small nodules on serosa near where right adnexa was intimate with the surface. No obvious intraoperative rupture of cyst, although in 2 areas the wall was thin and one of these areas had some bleeding. Slight scarring of left bladder dome to LUS/cervix. Uterus on frozen section c/w hyperplasia and a small focus of endometrial CA - no myo invasion, <2cm in size. Frozen section on the right adnexa was carcinoma, met from colon or possibly Gyn, favor GI primary, defer to permanent. Left ovary was WNL.     09/15/2017 Cancer Staging   Staging form: Corpus Uteri - Carcinoma and Carcinosarcoma, AJCC 8th Edition - Pathologic: FIGO Stage IA (pT1a, pN0, cM0) - Signed by Heath Lark, MD on 09/15/2017    09/26/2017 Procedure   Successful placement of a right internal jugular approach power injectable Port-A-Cath. The catheter is ready for immediate use.    11/07/2017 Imaging   1. Since 08/18/2017, similar to  slight decrease in size of a pancreatic body/tail junction lesion. Cross modality comparison relative to 09/16/2017 MRI  is also grossly similar. 2. No evidence of metastatic disease. 3.  Aortic Atherosclerosis (ICD10-I70.0).  4. Left nephrolithiasis.    11/18/2017 Genetic Testing   MSH2 c.[id number] pathogenic mutation identified in the CancerNext panel.  The CancerNext gene panel offered by [employer] includes sequencing and rearrangement analysis for the following 34 genes:   APC, ATM, BARD1, BMPR1A, BRCA1, BRCA2, BRIP1, CDH1, CDK4, CDKN2A, CHEK2, DICER1, HOXB13, EPCAM, GREM1, MLH1, MRE11A, MSH2, MSH6, MUTYH, NBN, NF1, PALB2, PMS2, POLD1, POLE, PTEN, RAD50, RAD51C, RAD51D, SMAD4, SMARCA4, STK11, and TP53.  The report date is November 18, 2017.  MSH2 [id number] pathogenic mutation identified on somatic testing.  These results are consistent with a diagnosis of Lynch syndrome.    06/18/2019 - 08/14/2019 Chemotherapy   The patient had FOLFIRINOX for chemotherapy treatment.     01/19/2020 - 08/19/2020 Chemotherapy          04/11/2020 Imaging   1. Essentially stable appearance of the soft tissue density process along the distal pancreatectomy site, abutting the posterior portion of the stomach, celiac trunk, left lateral margin of the SMA, and potentially the superior margin of the left renal vein. This could be inflammatory but residual tumor not excluded. 2. Some of the liver lesions are mildly reduced in size and some of the liver lesions are stable in size. 3. Further reduction in omental nodularity. 4. Small paraumbilical hernia contains a loop of small bowel, without findings of strangulation or obstruction. 5. Small supraumbilical hernias containing adipose tissue. 6. Small nonobstructive left renal calculus.   09/18/2020 -  Chemotherapy    Patient is on Treatment Plan: PANCREATIC MSI-H/DMMR PEMBROLIZUMAB Q21D       Secondary malignant neoplasm of right ovary ([place])   08/23/2017 Tumor Marker   Patient's tumor was tested for the following markers: CA-125 Results of the tumor marker test revealed 139.8    08/31/2017 Initial Diagnosis   Secondary malignant neoplasm of right ovary ([place])    09/15/2017 Cancer Staging   Staging form: Ovary, Fallopian Tube, and Primary Peritoneal Carcinoma, AJCC 8th Edition - Pathologic: Stage IIB (pT2b, pN0, cM0) - Signed by Heath Lark, MD on 09/15/2017    09/27/2017 Imaging   No evidence of metastatic disease or other acute findings within the thorax.   4 cm low-attenuation mass in pancreatic tail, highly suspicious for pancreatic carcinoma. This is caused splenic vein thrombosis, with new venous collaterals in the left upper quadrant. Consider endoscopic ultrasound with FNA for tissue diagnosis.   Stable benign hepatic hemangioma.      09/27/2017 Tumor Marker   Patient's tumor was tested for the following markers: CA-125 Results of the tumor marker test revealed 39.4    11/02/2017 Tumor Marker   Patient's tumor was tested for the following markers: CA-125 Results of the tumor marker test revealed 19    11/07/2017 Tumor Marker   Patient's tumor was tested for the following markers: CA-125 Results of the tumor marker test revealed 18.3    11/18/2017 Genetic Testing   MSH2 c.2237dupT pathogenic mutation identified in the CancerNext panel.  The CancerNext gene panel offered by [employer] includes sequencing and rearrangement analysis for the following 34 genes:   APC, ATM, BARD1, BMPR1A, BRCA1, BRCA2, BRIP1, CDH1, CDK4, CDKN2A, CHEK2, DICER1, HOXB13, EPCAM, GREM1, MLH1, MRE11A, MSH2, MSH6, MUTYH, NBN, NF1, PALB2, PMS2, POLD1, POLE, PTEN, RAD50, RAD51C, RAD51D, SMAD4, SMARCA4, STK11, and TP53.  The report date is November 18, 2017.  MSH2 [id number] pathogenic mutation identified on somatic testing.  These results are  consistent with a diagnosis of Lynch syndrome.    12/15/2017 Tumor Marker   Patient's  tumor was tested for the following markers: CA-125 Results of the tumor marker test revealed 57.3    01/16/2018 Tumor Marker   Patient's tumor was tested for the following markers: CA-125 Results of the tumor marker test revealed 24.2    04/10/2018 Tumor Marker   Patient's tumor was tested for the following markers: CA-125 Results of the tumor marker test revealed 18.2    07/12/2018 Tumor Marker   Patient's tumor was tested for the following markers: CA-125 Results of the tumor marker test revealed 11.8    10/16/2018 Tumor Marker   Patient's tumor was tested for the following markers: CA125 Results of the tumor marker test revealed 12.4.   01/18/2020 Tumor Marker   Patient's tumor was tested for the following markers: CA-125 Results of the tumor marker test revealed 18   Malignant neoplasm of tail of pancreas ([place])  10/13/2017 Pathology Results   Pancreas tail mass, endoscopic ultrasound-guided, fine needle aspiration II (smears and cell block):      Adenocarcinoma    11/18/2017 Genetic Testing   MSH2 c.2237dupT pathogenic mutation identified in the CancerNext panel.  The CancerNext gene panel offered by [employer] includes sequencing and rearrangement analysis for the following 34 genes:   APC, ATM, BARD1, BMPR1A, BRCA1, BRCA2, BRIP1, CDH1, CDK4, CDKN2A, CHEK2, DICER1, HOXB13, EPCAM, GREM1, MLH1, MRE11A, MSH2, MSH6, MUTYH, NBN, NF1, PALB2, PMS2, POLD1, POLE, PTEN, RAD50, RAD51C, RAD51D, SMAD4, SMARCA4, STK11, and TP53.  The report date is November 18, 2017.  MSH2 [id number] pathogenic mutation identified on somatic testing.  These results are consistent with a diagnosis of Lynch syndrome.    11/24/2017 Pathology Results   A.  "TAIL OF PANCREAS AND SPLEEN", DISTAL PANCREATECTOMY AND SPLENECTOMY:      Invasive ductal adenocarcinoma, moderately to poorly differentiated with focal signet ring cell features, of pancreas (distal).         The carcinoma is 2.5 cm in  greatest dimension grossly.          Treatment effects present in the form of fibrosis (50%).      No lymphovascular or definite perineural invasion identified.      All surgical margins are negative for tumor or high-grade dysplasia.      Adjacent mucinous neoplasm, most consistent with Intraductal papillary mucinous neoplasm (IPMN) with low-grade dysplasia.         Uninvolved pancreas show atrophy and focal acute inflammation.          Twelve benign lymph nodes (0/12).      Spleen with no significant histopathologic abnormalities.  PROCEDURE: distal pancreatectomy and splenectomy TUMOR SITE: distal pancreas TUMOR SIZE:   GREATEST DIMENSION: 2.5  cm   ADDITIONAL DIMENSIONS:   x   cm HISTOLOGIC TYPE: ductal adenocarcinoma HISTOLOGIC GRADE: grade 3 TUMOR EXTENSION: peripancreatic soft tissue MARGINS: negative for tumor TREATMENT EFFECT:  treatment effects present in the form of fibrosis (50%). LYMPHOVASCULAR INVASION: not identified PERINEURAL INVASION: no definite evidence REGIONAL LYMPH NODES:     NUMBER OF LYMPH NODES INVOLVED: 0     NUMBER OF LYMPH NODES EXAMINED: 12 PATHOLOGIC STAGE CLASSIFICATION (pTNM, AJCC 8th Ed): ypT2, ypN0 DISTANT METASTASIS (pM):  pMx ADDITIONAL PATHOLOGIC FINDINGS: mucinous neoplasm, most consistent with intraductal papillary mucinous neoplasm (IPMN) with low-grade dysplasia, is identified adjacent to the main tumor.    11/24/2017 Cancer Staging   Staging form: Exocrine Pancreas, AJCC 8th Edition - Pathologic stage  from 11/24/2017: Stage IB (pT2, pN0, cM0) - Signed by Truitt Merle, MD on 01/03/2018    11/25/2017 Surgery   She had surgery at [facility] 1. Exploratory Laparotomy 2. Distal Pancreatectomy and Splenectomy 3. Intraoperative Ultrasound 4. Open Cholecystectomy    12/15/2017 Cancer Staging   Staging form: Exocrine Pancreas, AJCC 8th Edition - Clinical: Stage IB (cT2, cN0, cM0) - Signed by Heath Lark, MD on 12/15/2017    12/28/2017 Imaging    12/28/2017 CT Abdomen   IMPRESSION: 1. Postoperative findings from recent partial pancreatectomy including a 21 cubic cm fluid collection along the pancreatic resection margin which could represent early pseudocyst. 2. Nodularity along the lateral limb of the left adrenal gland could also be postoperative but merit surveillance, as the pancreatic lesion was in close proximity to this adrenal gland on the prior CT of 11/07/2017. 3. Asymmetric fullness inferiorly in the left breast. The patient has a history of prior left breast procedures, correlation with mammography is recommended. 4. Other imaging findings of potential clinical significance: Stable hemangioma in the left hepatic lobe. Splenectomy. Aortic Atherosclerosis (ICD10-I70.0). Right hemicolectomy. Small focus of fat necrosis in the right anterior abdominal wall subcutaneous tissues near the laparotomy site. Bilateral nonobstructive nephrolithiasis.    01/02/2018 Tumor Marker   Patient's tumor was tested for the following markers: CA-19-9 Results of the tumor marker test revealed 5    01/03/2018 - 06/05/2018 Chemotherapy   She received modified dose FOLFIRINOX    04/10/2018 Imaging   1. Distal pancreatectomy, without findings of recurrent or metastatic disease. 2. Incompletely imaged hypoenhancement within lower lobe right pulmonary artery branch is likely chronic (but interval since 10/09/2017) pulmonary embolism. Dedicated CTA could further evaluate. 3. Decrease in size of a peripancreatic complex fluid collection anteriorly, likely a resolving pseudocyst. 4. Decreased size of minimal fluid versus a borderline sized node in the gastrohepatic ligament. Recommend attention on follow-up. 5. Hepatic steatosis with a segment 4 hemangioma. 6. Aortic Atherosclerosis (ICD10-I70.0). This is significantly age advanced.    07/12/2018 Tumor Marker   Patient's tumor was tested for the following markers: CA-19-9 Results of the tumor marker  test revealed 6    07/12/2018 Imaging   1. Status post distal pancreatectomy with stable postoperative fluid collection adjacent to the ventral aspect of the pancreatic head. No findings to suggest metastatic disease in the abdomen or pelvis. 2. Hepatic steatosis with small cavernous hemangioma in segment 4A of the liver. 3. Slight decreased size of nonenlarged gastrohepatic ligament lymph node, presumably benign. 4. Aortic atherosclerosis.      10/16/2018 Imaging   Status post distal pancreatectomy. Stable postoperative seroma along the surgical margin.   Status post hysterectomy and right oophorectomy.   Status post right hemicolectomy with appendectomy.   No evidence of recurrent or metastatic disease.   No colonic wall thickening or mass is evident on CT.   01/22/2019 Tumor Marker   Patient's tumor was tested for the following markers: CA-19.9 Results of the tumor marker test revealed 5   01/22/2019 Imaging   1. No evidence of local recurrence or metastatic disease status post distal pancreatectomy and splenectomy. 2. Stable small seroma anterior to the pancreatic head. 3. Postsurgical changes as described. 4. Stable additional incidental findings including a hepatic hemangioma, nonobstructing bilateral renal calculi and aortic Atherosclerosis (ICD10-I70.0).   04/18/2019 Imaging   1. Status post distal pancreatectomy and splenectomy. There has been a gradual increase in ill-defined soft tissue and celiac axis/SMA origin lymph nodes adjacent to the distal pancreatectomy  margin and lesser curvature of the stomach/gastric body over sequential prior examinations dated 01/22/2019 and 09/26/2018. An enlarged lymph node or soft tissue nodule adjacent to the SMA origin now measures 1.8 x 0.8 cm. Findings are concerning for local recurrence of pancreatic malignancy. 2. Separately from the above findings, there remains a 1.0 cm low-attenuation nodule anterior to the remnant pancreatic  neck, most consistent with postoperative seroma 3. No evidence of distant metastatic disease in the chest, abdomen, or pelvis. 4. Postoperative findings of cholecystectomy, right hemicolectomy, and hysterectomy. 5. Bilateral nonobstructive nephrolithiasis. 6.  Aortic Atherosclerosis (ICD10-I70.0).       05/28/2019 Tumor Marker   Patient's tumor was tested for the following markers: CA19-9 Results of the tumor marker test revealed 18   05/28/2019 Imaging   1. Status post distal pancreatectomy with continued further progression of the ill-defined soft tissue to the left of the celiac axis/SMA origin, now measuring 1.9 x 1.9 cm and highly concerning for recurrent/metastatic disease. This process abuts both the celiac axis, SMA, and posterior wall of the mid stomach. PET-CT may prove helpful to further evaluate. 2. Bandlike ill-defined soft tissue in the anterior abdomen, potentially peritoneal or omental is similar to perhaps minimally increased qualitatively in the interval. Close attention on follow-up recommended. 3. Left paraumbilical hernia contains a short segment of small bowel without complicating features. 4. Ill-defined very subtle area of decreased attenuation in the posterior aspect of hepatic segment IV. This is likely focal fatty deposition. Close attention on follow-up recommended. 5.  Aortic Atherosclerois (ICD10-170.0)   06/07/2019 Pathology Results   Malignant cells consistent with adenocarcinoma   06/07/2019 Procedure   ENDOSCOPIC FINDING (limited views with linear echoendoscope): :      The examined esophagus was endoscopically normal.      The entire examined stomach was endoscopically normal.      ENDOSONOGRAPHIC FINDING: :      1. An irregular mass was identified in the region of the pancreatic tail resection site. The mass was stellate with very poorly defined borders. Fine needle aspiration for cytology was performed. Color Doppler imaging was utilized prior to needle  puncture to confirm a lack of significant vascular structures within the needle path. Four passes were made with the 25 gauge (FNB) needle using a transgastric approach. A cytotechnologist was present to evaluate the adequacy of the specimen. Final cytology results are pending. Impression:               -  Irregular, stellate soft tissue mass in the region of the pancreatic tail resection site. This was sampled with four transgastric passes with an EUS FNB needle..   06/18/2019 - 08/14/2019 Chemotherapy   The patient had FOLFIRINOX for chemotherapy treatment.     09/03/2019 Imaging   1. Findings are again highly concerning for locally recurrent disease adjacent to the pancreatectomy bed, with enlarging soft tissue mass which is intimately associated with the superior mesenteric artery, celiac axis and superior aspect of the left renal  vein, as detailed above. 2. No evidence of metastatic disease in the thorax. 3. 2.7 x 1.2 cm cavernous hemangioma in segment 4A of the liver again noted. 4. Small nonobstructive calculi in the collecting systems of both kidneys measuring up to 3 mm in the lower pole collecting system of the left kidney. 5. Aortic atherosclerosis. 6. Additional incidental findings, as above.   11/16/2019 Imaging   Similar-appearing ill-defined soft tissue the left of the celiac axis/SMA, most compatible with recurrent/metastatic disease.  Similar ill-defined nodularity within the anterior upper abdomen, potentially peritoneal or omental. Continued attention on follow-up is recommended.   12/31/2019 Tumor Marker   Patient's tumor was tested for the following markers: CA-19-9 Results of the tumor marker test revealed 298   01/10/2020 Imaging   1. Numerous small hypoenhancing liver masses scattered throughout the liver, all new since 11/16/2019 CT, compatible with new liver metastases. 2. Stable upper retroperitoneal mass encasing the celiac axis and abutting the pancreatic  resection margin, compatible with stable locally recurrent tumor. 3. Stable subcentimeter indistinct anterior upper peritoneal implants. 4. No evidence of metastatic disease in the chest. 5. Chronic findings include: Nonobstructing left nephrolithiasis. Stable small left paraumbilical hernia containing a small bowel loop without acute bowel complication. Aortic Atherosclerosis (ICD10-I70.0).     01/19/2020 - 08/19/2020 Chemotherapy          01/25/2020 Tumor Marker   Patient's tumor was tested for the following markers: CA-19-9 Results of the tumor marker test revealed 1104.   03/04/2020 Tumor Marker   Patient's tumor was tested for the following markers: CA-19-9 Results of the tumor marker test revealed 581   04/11/2020 Imaging   1. Essentially stable appearance of the soft tissue density process along the distal pancreatectomy site, abutting the posterior portion of the stomach, celiac trunk, left lateral margin of the SMA, and potentially the superior margin of the left renal vein. This could be inflammatory but residual tumor not excluded. 2. Some of the liver lesions are mildly reduced in size and some of the liver lesions are stable in size. 3. Further reduction in omental nodularity. 4. Small paraumbilical hernia contains a loop of small bowel, without findings of strangulation or obstruction. 5. Small supraumbilical hernias containing adipose tissue. 6. Small nonobstructive left renal calculus.   04/11/2020 Tumor Marker   Patient's tumor was tested for the following markers: CA19-9 Results of the tumor marker test revealed 451   05/27/2020 Imaging   1. Stable chest CT, without acute findings or evidence of metastatic disease. 2. Assessment of the patient's liver disease is limited by progressive geographic hepatic steatosis. There is one central hepatic lesion which appears slightly more conspicuous, and this may be contributing to new intrahepatic biliary dilatation posteriorly in  the right lobe. The other hepatic lesions are generally improved. No extrahepatic biliary dilatation. 3. 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 No apparent residual peritoneal disease. 4. Stable postsurgical changes and small hernias of the anterior abdominal wall.   05/28/2020 Tumor Marker   Patient's tumor was tested for the following markers: CA19-9 Results of the tumor marker test revealed 3097   06/24/2020 Tumor Marker   Patient's tumor was tested for the following marker CA19-9. Results of the tumor marker test revealed 4304   07/25/2020 Tumor Marker   Patient's tumor was tested for the following markers: CA-125 Results of the tumor marker test revealed 49.1   07/28/2020 Tumor Marker   Patient's tumor was tested for the following markers: CA-19-9 Results of the tumor marker test revealed 6501   09/01/2020 Imaging   IMPRESSION: 1. Interval progression of several low-density lesions in the liver with new tiny low-density liver lesions evident on today's study. Features are concerning for metastatic disease. PET-CT may prove helpful to further evaluate (see below). The mild posterior right intrahepatic biliary duct dilatation is stable. 2. Stable appearance of the soft tissue attenuation along the distal pancreatectomy resection bed, between the stomach and the celiac  axis/SMA. There is some subtle soft tissue attenuation to the left of the SMA, new/more prominent today than on the prior study. Attention on follow-up recommended. PET-CT may prove helpful to assess this region. 3. Stable appearance of the cavernous hemangioma in the anterior right liver. 4. Tiny nonobstructing left renal stone.   09/01/2020 Tumor Marker   Patient's tumor was tested for the following markers: CA19-9. Results of the tumor marker test revealed 12707.   09/18/2020 -  Chemotherapy    Patient is on Treatment Plan: PANCREATIC MSI-H/DMMR PEMBROLIZUMAB  Q21D         REVIEW OF SYSTEMS:   Constitutional: Denies fevers, chills or abnormal weight loss Eyes: Denies blurriness of vision Ears, nose, mouth, throat, and face: Denies mucositis or sore throat Respiratory: Denies cough, dyspnea or wheezes Cardiovascular: Denies palpitation, chest discomfort or lower extremity swelling Lymphatics: Denies new lymphadenopathy or easy bruising Neurological:Denies numbness, tingling or new weaknesses Behavioral/Psych: Mood is stable, no new changes  All other systems were reviewed with the patient and are negative.  I have reviewed the past medical history, past surgical history, social history and family history with the patient and they are unchanged from previous note.  ALLERGIES:  is allergic to codeine, other, penicillins, and bactrim [sulfamethoxazole-trimethoprim].  MEDICATIONS:  Current Outpatient Medications  Medication Sig Dispense Refill   cetirizine (ZYRTEC) 10 MG tablet Take 10 mg by mouth daily.     citalopram (CELEXA) 20 MG tablet TAKE 1 TABLET BY MOUTH EVERY DAY 90 tablet 4   diclofenac Sodium (VOLTAREN) 1 % GEL APPLY 2-4 GRAMS TO AFFECTED JOINT 4 TIMES DAILY AS NEEDED. 400 g 1   estradiol (ESTRACE VAGINAL) 0.1 MG/GM vaginal cream Place 1 Applicatorful vaginally 3 (three) times a week. 42.5 g 12   fluticasone (FLONASE) 50 MCG/ACT nasal spray Place 1 spray into both nostrils daily.     HYDROmorphone (DILAUDID) 8 MG tablet Take 1 tablet (8 mg total) by mouth every 4 (four) hours as needed. 90 tablet 0   lidocaine-prilocaine (EMLA) cream Apply 1 application topically daily as needed (port). Apply to affected area once 30 g 11   metFORMIN (GLUCOPHAGE) 500 MG tablet Take 2 tablets (1,000 mg total) by mouth 2 (two) times daily with a meal. 180 tablet 11   methadone (DOLOPHINE) 10 MG tablet Take 1 tablet (10 mg total) by mouth every 12 (twelve) hours. 60 tablet 0   Multiple Vitamin (MULTIVITAMIN WITH MINERALS) TABS tablet Take 1 tablet by  mouth daily.     naproxen (NAPROSYN) 500 MG tablet Take 1 tablet (500 mg total) by mouth 2 (two) times daily with a meal. (Patient taking differently: Take 500 mg by mouth as needed.) 30 tablet 0   omeprazole (PRILOSEC) 40 MG capsule TAKE 1 CAPSULE BY MOUTH EVERY DAY 90 capsule 3   ondansetron (ZOFRAN) 8 MG tablet TAKE 1 TAB BY MOUTH EVERY 8HOURS AS NEEDED FOR REFRACTORY NAUSEA/VOMITING START ON DAY 3AFTER CHEMO 8 tablet 7   prochlorperazine (COMPAZINE) 10 MG tablet TAKE 1 TABLET BY MOUTH EVERY 6 HOURS AS NEEDED 90 tablet 3   simethicone (MYLICON) 742 MG chewable tablet Chew 125 mg by mouth every 6 (six) hours as needed for flatulence.     No current facility-administered medications for this visit.   Facility-Administered Medications Ordered in Other Visits  Medication Dose Route Frequency Provider Last Rate Last Admin   heparin lock flush 100 unit/mL  500 Units Intracatheter Once PRN Heath Lark, MD  sodium chloride flush (NS) 0.9 % injection 10 mL  10 mL Intracatheter Once PRN Alvy Bimler, Richele Strand, MD        PHYSICAL EXAMINATION: ECOG PERFORMANCE STATUS: 1 - Symptomatic but completely ambulatory GENERAL:alert, no distress and comfortable.  She looks profoundly jaundiced NEURO: alert & oriented x 3 with fluent speech, no focal motor/sensory deficits  LABORATORY DATA:  I have reviewed the data as listed    Component Value Date/Time   NA 134 (L) 09/18/2020 1349   K 3.6 09/18/2020 1349   CL 99 09/18/2020 1349   CO2 26 09/18/2020 1349   GLUCOSE 147 (H) 09/18/2020 1349   BUN 9 09/18/2020 1349   CREATININE 0.70 09/18/2020 1349   CREATININE 0.68 07/27/2017 0851   CALCIUM 8.7 (L) 09/18/2020 1349   PROT 5.9 (L) 09/18/2020 1349   ALBUMIN 2.4 (L) 09/18/2020 1349   AST 234 (HH) 09/18/2020 1349   ALT 233 (H) 09/18/2020 1349   ALKPHOS 654 (H) 09/18/2020 1349   BILITOT 14.6 (HH) 09/18/2020 1349   GFRNONAA >60 09/18/2020 1349   GFRNONAA 106 07/27/2017 0851   GFRAA >60 11/09/2019 1012   GFRAA  >60 09/27/2019 1535   GFRAA 123 07/27/2017 0851    No results found for: SPEP, UPEP  Lab Results  Component Value Date   WBC 11.5 (H) 09/18/2020   NEUTROABS 8.3 (H) 09/18/2020   HGB 11.9 (L) 09/18/2020   HCT 36.7 09/18/2020   MCV 101.1 (H) 09/18/2020   PLT 263 09/18/2020      Chemistry      Component Value Date/Time   NA 134 (L) 09/18/2020 1349   K 3.6 09/18/2020 1349   CL 99 09/18/2020 1349   CO2 26 09/18/2020 1349   BUN 9 09/18/2020 1349   CREATININE 0.70 09/18/2020 1349   CREATININE 0.68 07/27/2017 0851      Component Value Date/Time   CALCIUM 8.7 (L) 09/18/2020 1349   ALKPHOS 654 (H) 09/18/2020 1349   AST 234 (HH) 09/18/2020 1349   ALT 233 (H) 09/18/2020 1349   BILITOT 14.6 (HH) 09/18/2020 1349       RADIOGRAPHIC STUDIES: I have personally reviewed the radiological images as listed and agreed with the findings in the report. CT ABDOMEN PELVIS W CONTRAST  Result Date: 09/01/2020 CLINICAL DATA:  Pancreatic cancer.  Restaging. EXAM: CT ABDOMEN AND PELVIS WITH CONTRAST TECHNIQUE: Multidetector CT imaging of the abdomen and pelvis was performed using the standard protocol following bolus administration of intravenous contrast. CONTRAST:  50m OMNIPAQUE IOHEXOL 300 MG/ML  SOLN COMPARISON:  06/03/2020 FINDINGS: Lower chest: Unremarkable. Hepatobiliary: Geographic fatty deposition again noted in the liver parenchyma. Cavernous hemangioma in the anterior right liver is stable. 18 mm low-density lesion posterior segment IV appears increased from 8 mm previously. 11 mm low-density lesion noted inferior right liver (22/7) not definitely seen on the prior exam. Tiny rim enhancing lesion posterior segment 3 (19/7) measures 10 mm today and was not visible on the prior study. Additional tiny hypoattenuating lesions in the left liver, both visible on 11/07) were not seen previously. Gallbladder surgically absent. Mild prominence of the right intrahepatic bile ducts is stable. Extrahepatic  common duct is nondilated. Pancreas: Status post distal pancreatectomy. Soft tissue attenuation along the resection bed, between the stomach and the celiac axis/SMA is not substantially changed in the interval. There is some subtle soft tissue attenuation to the left of the SMA (19/2), more prominent today than on the prior study. Spleen: Splenectomy. Adrenals/Urinary Tract: No adrenal nodule or  mass. Right kidney unremarkable. 1-2 mm nonobstructing stone noted lower interpolar left kidney. No evidence for hydroureter. The urinary bladder appears normal for the degree of distention. Stomach/Bowel: Stomach is unremarkable. No gastric wall thickening. No evidence of outlet obstruction. Duodenum is normally positioned as is the ligament of Treitz. No small bowel wall thickening. No small bowel dilatation. Status post right hemicolectomy. No gross colonic mass. No colonic wall thickening. Vascular/Lymphatic: No abdominal aortic aneurysm. No abdominal lymphadenopathy. No pelvic sidewall lymphadenopathy. Reproductive: Uterus surgically absent.  There is no adnexal mass. Other: No intraperitoneal free fluid. Musculoskeletal: Small supraumbilical midline ventral hernia is mildly progressive in the interval with a portion of the anterior wall of the transverse colon bulging into the defect. No complicating features. Small left paraumbilical hernia is stable without complication. No worrisome lytic or sclerotic osseous abnormality. IMPRESSION: 1. Interval progression of several low-density lesions in the liver with new tiny low-density liver lesions evident on today's study. Features are concerning for metastatic disease. PET-CT may prove helpful to further evaluate (see below). The mild posterior right intrahepatic biliary duct dilatation is stable. 2. Stable appearance of the soft tissue attenuation along the distal pancreatectomy resection bed, between the stomach and the celiac axis/SMA. There is some subtle soft tissue  attenuation to the left of the SMA, new/more prominent today than on the prior study. Attention on follow-up recommended. PET-CT may prove helpful to assess this region. 3. Stable appearance of the cavernous hemangioma in the anterior right liver. 4. Tiny nonobstructing left renal stone. Electronically Signed   By: Misty Stanley M.D.   On: 09/01/2020 13:56

## 2020-09-18 NOTE — Assessment & Plan Note (Signed)
Unfortunately, she is developing acute liver failure We discussed the risk and benefits of treatment and she has made informed decision to forego treatment I will provide IV fluid support I recommend hospice referral

## 2020-09-18 NOTE — Assessment & Plan Note (Signed)
She has significant, acute progressive liver failure The patient has noted itching and deep jaundice She has made informed decision not to pursue treatment We discussed what to expect with progressive liver failure I recommend the patient to start anticoagulation therapy due to high risk of bleeding

## 2020-09-18 NOTE — Telephone Encounter (Signed)
Referral for hospice ordered in Epic and called referral to [facility]. They will call [facility] tomorrow.

## 2020-09-18 NOTE — Assessment & Plan Note (Signed)
We have extensive discussions about goals of care in the past The patient has made informed decision to pursue hospice at this point We discussed prognosis, likely less than 3 months We will make referral to palliative care/hospice service I gave her a DNR order

## 2020-09-18 NOTE — Assessment & Plan Note (Signed)
I recommend discontinuation of Xarelto in anticipation she might be coagulopathy with liver failure

## 2020-09-18 NOTE — Progress Notes (Signed)
CRITICAL VALUE STICKER  CRITICAL VALUE: Total Bilirubin 14.6 and AST 234.  RECEIVER (on-site recipient of call):Moua Rasmusson Wynetta Emery, [place] NOTIFIED: 1452 PM 09/18/20  MESSENGER (representative from lab):Jay Scotton.  MD NOTIFIED: Dr. Heath Lark  TIME OF NOTIFICATION: [id number] pm 09/18/20  RESPONSE: Will see at appt today.

## 2020-09-19 LAB — T4: T4, Total: 9.8 ug/dL (ref 4.5–12.0)

## 2020-09-19 LAB — TSH: TSH: 0.502 u[IU]/mL (ref 0.308–3.960)

## 2020-09-23 DIAGNOSIS — Z1509 Genetic susceptibility to other malignant neoplasm: Secondary | ICD-10-CM | POA: Diagnosis not present

## 2020-09-23 DIAGNOSIS — R17 Unspecified jaundice: Secondary | ICD-10-CM | POA: Diagnosis not present

## 2020-09-23 DIAGNOSIS — Z9221 Personal history of antineoplastic chemotherapy: Secondary | ICD-10-CM | POA: Diagnosis not present

## 2020-09-23 DIAGNOSIS — R634 Abnormal weight loss: Secondary | ICD-10-CM | POA: Diagnosis not present

## 2020-09-23 DIAGNOSIS — R630 Anorexia: Secondary | ICD-10-CM | POA: Diagnosis not present

## 2020-09-23 DIAGNOSIS — C252 Malignant neoplasm of tail of pancreas: Secondary | ICD-10-CM | POA: Diagnosis not present

## 2020-09-23 DIAGNOSIS — G620 Drug-induced polyneuropathy: Secondary | ICD-10-CM | POA: Diagnosis not present

## 2020-09-23 DIAGNOSIS — Z8619 Personal history of other infectious and parasitic diseases: Secondary | ICD-10-CM | POA: Diagnosis not present

## 2020-09-23 DIAGNOSIS — R110 Nausea: Secondary | ICD-10-CM | POA: Diagnosis not present

## 2020-09-23 DIAGNOSIS — C569 Malignant neoplasm of unspecified ovary: Secondary | ICD-10-CM | POA: Diagnosis not present

## 2020-09-23 DIAGNOSIS — C541 Malignant neoplasm of endometrium: Secondary | ICD-10-CM | POA: Diagnosis not present

## 2020-09-23 DIAGNOSIS — C189 Malignant neoplasm of colon, unspecified: Secondary | ICD-10-CM | POA: Diagnosis not present

## 2020-09-23 DIAGNOSIS — C787 Secondary malignant neoplasm of liver and intrahepatic bile duct: Secondary | ICD-10-CM | POA: Diagnosis not present

## 2020-09-23 DIAGNOSIS — E8809 Other disorders of plasma-protein metabolism, not elsewhere classified: Secondary | ICD-10-CM | POA: Diagnosis not present

## 2020-09-23 DIAGNOSIS — Z87442 Personal history of urinary calculi: Secondary | ICD-10-CM | POA: Diagnosis not present

## 2020-09-24 DIAGNOSIS — C569 Malignant neoplasm of unspecified ovary: Secondary | ICD-10-CM | POA: Diagnosis not present

## 2020-09-24 DIAGNOSIS — Z8619 Personal history of other infectious and parasitic diseases: Secondary | ICD-10-CM | POA: Diagnosis not present

## 2020-09-24 DIAGNOSIS — Z1509 Genetic susceptibility to other malignant neoplasm: Secondary | ICD-10-CM | POA: Diagnosis not present

## 2020-09-24 DIAGNOSIS — C541 Malignant neoplasm of endometrium: Secondary | ICD-10-CM | POA: Diagnosis not present

## 2020-09-24 DIAGNOSIS — Z9221 Personal history of antineoplastic chemotherapy: Secondary | ICD-10-CM | POA: Diagnosis not present

## 2020-09-24 DIAGNOSIS — R17 Unspecified jaundice: Secondary | ICD-10-CM | POA: Diagnosis not present

## 2020-09-24 DIAGNOSIS — E8809 Other disorders of plasma-protein metabolism, not elsewhere classified: Secondary | ICD-10-CM | POA: Diagnosis not present

## 2020-09-24 DIAGNOSIS — R630 Anorexia: Secondary | ICD-10-CM | POA: Diagnosis not present

## 2020-09-24 DIAGNOSIS — R634 Abnormal weight loss: Secondary | ICD-10-CM | POA: Diagnosis not present

## 2020-09-24 DIAGNOSIS — C787 Secondary malignant neoplasm of liver and intrahepatic bile duct: Secondary | ICD-10-CM | POA: Diagnosis not present

## 2020-09-24 DIAGNOSIS — R110 Nausea: Secondary | ICD-10-CM | POA: Diagnosis not present

## 2020-09-24 DIAGNOSIS — C189 Malignant neoplasm of colon, unspecified: Secondary | ICD-10-CM | POA: Diagnosis not present

## 2020-09-24 DIAGNOSIS — G620 Drug-induced polyneuropathy: Secondary | ICD-10-CM | POA: Diagnosis not present

## 2020-09-24 DIAGNOSIS — Z87442 Personal history of urinary calculi: Secondary | ICD-10-CM | POA: Diagnosis not present

## 2020-09-24 DIAGNOSIS — C252 Malignant neoplasm of tail of pancreas: Secondary | ICD-10-CM | POA: Diagnosis not present

## 2020-09-25 DIAGNOSIS — C252 Malignant neoplasm of tail of pancreas: Secondary | ICD-10-CM | POA: Diagnosis not present

## 2020-09-25 DIAGNOSIS — E8809 Other disorders of plasma-protein metabolism, not elsewhere classified: Secondary | ICD-10-CM | POA: Diagnosis not present

## 2020-09-25 DIAGNOSIS — C541 Malignant neoplasm of endometrium: Secondary | ICD-10-CM | POA: Diagnosis not present

## 2020-09-25 DIAGNOSIS — C189 Malignant neoplasm of colon, unspecified: Secondary | ICD-10-CM | POA: Diagnosis not present

## 2020-09-25 DIAGNOSIS — Z8619 Personal history of other infectious and parasitic diseases: Secondary | ICD-10-CM | POA: Diagnosis not present

## 2020-09-25 DIAGNOSIS — Z9221 Personal history of antineoplastic chemotherapy: Secondary | ICD-10-CM | POA: Diagnosis not present

## 2020-09-25 DIAGNOSIS — R17 Unspecified jaundice: Secondary | ICD-10-CM | POA: Diagnosis not present

## 2020-09-25 DIAGNOSIS — R110 Nausea: Secondary | ICD-10-CM | POA: Diagnosis not present

## 2020-09-25 DIAGNOSIS — Z87442 Personal history of urinary calculi: Secondary | ICD-10-CM | POA: Diagnosis not present

## 2020-09-25 DIAGNOSIS — C569 Malignant neoplasm of unspecified ovary: Secondary | ICD-10-CM | POA: Diagnosis not present

## 2020-09-25 DIAGNOSIS — Z1509 Genetic susceptibility to other malignant neoplasm: Secondary | ICD-10-CM | POA: Diagnosis not present

## 2020-09-25 DIAGNOSIS — R630 Anorexia: Secondary | ICD-10-CM | POA: Diagnosis not present

## 2020-09-25 DIAGNOSIS — C787 Secondary malignant neoplasm of liver and intrahepatic bile duct: Secondary | ICD-10-CM | POA: Diagnosis not present

## 2020-09-25 DIAGNOSIS — G620 Drug-induced polyneuropathy: Secondary | ICD-10-CM | POA: Diagnosis not present

## 2020-09-25 DIAGNOSIS — R634 Abnormal weight loss: Secondary | ICD-10-CM | POA: Diagnosis not present

## 2020-09-26 ENCOUNTER — Ambulatory Visit: Payer: BC Managed Care – PPO

## 2020-09-26 ENCOUNTER — Other Ambulatory Visit: Payer: Self-pay | Admitting: Physician Assistant

## 2020-09-26 ENCOUNTER — Other Ambulatory Visit: Payer: BC Managed Care – PPO

## 2020-09-26 ENCOUNTER — Ambulatory Visit: Payer: BC Managed Care – PPO | Admitting: Hematology and Oncology

## 2020-09-26 DIAGNOSIS — G620 Drug-induced polyneuropathy: Secondary | ICD-10-CM | POA: Diagnosis not present

## 2020-09-26 DIAGNOSIS — C569 Malignant neoplasm of unspecified ovary: Secondary | ICD-10-CM | POA: Diagnosis not present

## 2020-09-26 DIAGNOSIS — C541 Malignant neoplasm of endometrium: Secondary | ICD-10-CM | POA: Diagnosis not present

## 2020-09-26 DIAGNOSIS — Z87442 Personal history of urinary calculi: Secondary | ICD-10-CM | POA: Diagnosis not present

## 2020-09-26 DIAGNOSIS — E8809 Other disorders of plasma-protein metabolism, not elsewhere classified: Secondary | ICD-10-CM | POA: Diagnosis not present

## 2020-09-26 DIAGNOSIS — C252 Malignant neoplasm of tail of pancreas: Secondary | ICD-10-CM | POA: Diagnosis not present

## 2020-09-26 DIAGNOSIS — R630 Anorexia: Secondary | ICD-10-CM | POA: Diagnosis not present

## 2020-09-26 DIAGNOSIS — R634 Abnormal weight loss: Secondary | ICD-10-CM | POA: Diagnosis not present

## 2020-09-26 DIAGNOSIS — Z9221 Personal history of antineoplastic chemotherapy: Secondary | ICD-10-CM | POA: Diagnosis not present

## 2020-09-26 DIAGNOSIS — C189 Malignant neoplasm of colon, unspecified: Secondary | ICD-10-CM | POA: Diagnosis not present

## 2020-09-26 DIAGNOSIS — C787 Secondary malignant neoplasm of liver and intrahepatic bile duct: Secondary | ICD-10-CM | POA: Diagnosis not present

## 2020-09-26 DIAGNOSIS — Z8619 Personal history of other infectious and parasitic diseases: Secondary | ICD-10-CM | POA: Diagnosis not present

## 2020-09-26 DIAGNOSIS — Z1509 Genetic susceptibility to other malignant neoplasm: Secondary | ICD-10-CM | POA: Diagnosis not present

## 2020-09-26 DIAGNOSIS — R17 Unspecified jaundice: Secondary | ICD-10-CM | POA: Diagnosis not present

## 2020-09-26 DIAGNOSIS — R110 Nausea: Secondary | ICD-10-CM | POA: Diagnosis not present

## 2020-09-26 NOTE — Telephone Encounter (Signed)
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 daily for almost 2 years now.  She is currently on prednisone 5 mg p.o. daily.  Okay to refill Prednisone?

## 2020-09-27 DIAGNOSIS — C569 Malignant neoplasm of unspecified ovary: Secondary | ICD-10-CM | POA: Diagnosis not present

## 2020-09-27 DIAGNOSIS — R630 Anorexia: Secondary | ICD-10-CM | POA: Diagnosis not present

## 2020-09-27 DIAGNOSIS — E8809 Other disorders of plasma-protein metabolism, not elsewhere classified: Secondary | ICD-10-CM | POA: Diagnosis not present

## 2020-09-27 DIAGNOSIS — C189 Malignant neoplasm of colon, unspecified: Secondary | ICD-10-CM | POA: Diagnosis not present

## 2020-09-27 DIAGNOSIS — Z8619 Personal history of other infectious and parasitic diseases: Secondary | ICD-10-CM | POA: Diagnosis not present

## 2020-09-27 DIAGNOSIS — C541 Malignant neoplasm of endometrium: Secondary | ICD-10-CM | POA: Diagnosis not present

## 2020-09-27 DIAGNOSIS — R634 Abnormal weight loss: Secondary | ICD-10-CM | POA: Diagnosis not present

## 2020-09-27 DIAGNOSIS — C252 Malignant neoplasm of tail of pancreas: Secondary | ICD-10-CM | POA: Diagnosis not present

## 2020-09-27 DIAGNOSIS — Z87442 Personal history of urinary calculi: Secondary | ICD-10-CM | POA: Diagnosis not present

## 2020-09-27 DIAGNOSIS — G620 Drug-induced polyneuropathy: Secondary | ICD-10-CM | POA: Diagnosis not present

## 2020-09-27 DIAGNOSIS — C787 Secondary malignant neoplasm of liver and intrahepatic bile duct: Secondary | ICD-10-CM | POA: Diagnosis not present

## 2020-09-27 DIAGNOSIS — R17 Unspecified jaundice: Secondary | ICD-10-CM | POA: Diagnosis not present

## 2020-09-27 DIAGNOSIS — Z1509 Genetic susceptibility to other malignant neoplasm: Secondary | ICD-10-CM | POA: Diagnosis not present

## 2020-09-27 DIAGNOSIS — Z9221 Personal history of antineoplastic chemotherapy: Secondary | ICD-10-CM | POA: Diagnosis not present

## 2020-09-27 DIAGNOSIS — R110 Nausea: Secondary | ICD-10-CM | POA: Diagnosis not present

## 2020-09-28 DIAGNOSIS — R17 Unspecified jaundice: Secondary | ICD-10-CM | POA: Diagnosis not present

## 2020-09-28 DIAGNOSIS — C569 Malignant neoplasm of unspecified ovary: Secondary | ICD-10-CM | POA: Diagnosis not present

## 2020-09-28 DIAGNOSIS — C252 Malignant neoplasm of tail of pancreas: Secondary | ICD-10-CM | POA: Diagnosis not present

## 2020-09-28 DIAGNOSIS — Z8619 Personal history of other infectious and parasitic diseases: Secondary | ICD-10-CM | POA: Diagnosis not present

## 2020-09-28 DIAGNOSIS — Z1509 Genetic susceptibility to other malignant neoplasm: Secondary | ICD-10-CM | POA: Diagnosis not present

## 2020-09-28 DIAGNOSIS — C189 Malignant neoplasm of colon, unspecified: Secondary | ICD-10-CM | POA: Diagnosis not present

## 2020-09-28 DIAGNOSIS — R630 Anorexia: Secondary | ICD-10-CM | POA: Diagnosis not present

## 2020-09-28 DIAGNOSIS — G620 Drug-induced polyneuropathy: Secondary | ICD-10-CM | POA: Diagnosis not present

## 2020-09-28 DIAGNOSIS — C787 Secondary malignant neoplasm of liver and intrahepatic bile duct: Secondary | ICD-10-CM | POA: Diagnosis not present

## 2020-09-28 DIAGNOSIS — Z9221 Personal history of antineoplastic chemotherapy: Secondary | ICD-10-CM | POA: Diagnosis not present

## 2020-09-28 DIAGNOSIS — R634 Abnormal weight loss: Secondary | ICD-10-CM | POA: Diagnosis not present

## 2020-09-28 DIAGNOSIS — R110 Nausea: Secondary | ICD-10-CM | POA: Diagnosis not present

## 2020-09-28 DIAGNOSIS — Z87442 Personal history of urinary calculi: Secondary | ICD-10-CM | POA: Diagnosis not present

## 2020-09-28 DIAGNOSIS — E8809 Other disorders of plasma-protein metabolism, not elsewhere classified: Secondary | ICD-10-CM | POA: Diagnosis not present

## 2020-09-28 DIAGNOSIS — C541 Malignant neoplasm of endometrium: Secondary | ICD-10-CM | POA: Diagnosis not present

## 2020-09-29 DIAGNOSIS — E8809 Other disorders of plasma-protein metabolism, not elsewhere classified: Secondary | ICD-10-CM | POA: Diagnosis not present

## 2020-09-29 DIAGNOSIS — R634 Abnormal weight loss: Secondary | ICD-10-CM | POA: Diagnosis not present

## 2020-09-29 DIAGNOSIS — C787 Secondary malignant neoplasm of liver and intrahepatic bile duct: Secondary | ICD-10-CM | POA: Diagnosis not present

## 2020-09-29 DIAGNOSIS — C541 Malignant neoplasm of endometrium: Secondary | ICD-10-CM | POA: Diagnosis not present

## 2020-09-29 DIAGNOSIS — Z8619 Personal history of other infectious and parasitic diseases: Secondary | ICD-10-CM | POA: Diagnosis not present

## 2020-09-29 DIAGNOSIS — R630 Anorexia: Secondary | ICD-10-CM | POA: Diagnosis not present

## 2020-09-29 DIAGNOSIS — R110 Nausea: Secondary | ICD-10-CM | POA: Diagnosis not present

## 2020-09-29 DIAGNOSIS — Z9221 Personal history of antineoplastic chemotherapy: Secondary | ICD-10-CM | POA: Diagnosis not present

## 2020-09-29 DIAGNOSIS — R17 Unspecified jaundice: Secondary | ICD-10-CM | POA: Diagnosis not present

## 2020-09-29 DIAGNOSIS — G620 Drug-induced polyneuropathy: Secondary | ICD-10-CM | POA: Diagnosis not present

## 2020-09-29 DIAGNOSIS — C569 Malignant neoplasm of unspecified ovary: Secondary | ICD-10-CM | POA: Diagnosis not present

## 2020-09-29 DIAGNOSIS — Z87442 Personal history of urinary calculi: Secondary | ICD-10-CM | POA: Diagnosis not present

## 2020-09-29 DIAGNOSIS — C252 Malignant neoplasm of tail of pancreas: Secondary | ICD-10-CM | POA: Diagnosis not present

## 2020-09-29 DIAGNOSIS — C189 Malignant neoplasm of colon, unspecified: Secondary | ICD-10-CM | POA: Diagnosis not present

## 2020-09-29 DIAGNOSIS — Z1509 Genetic susceptibility to other malignant neoplasm: Secondary | ICD-10-CM | POA: Diagnosis not present

## 2020-09-30 DIAGNOSIS — C189 Malignant neoplasm of colon, unspecified: Secondary | ICD-10-CM | POA: Diagnosis not present

## 2020-09-30 DIAGNOSIS — C787 Secondary malignant neoplasm of liver and intrahepatic bile duct: Secondary | ICD-10-CM | POA: Diagnosis not present

## 2020-09-30 DIAGNOSIS — R110 Nausea: Secondary | ICD-10-CM | POA: Diagnosis not present

## 2020-09-30 DIAGNOSIS — R17 Unspecified jaundice: Secondary | ICD-10-CM | POA: Diagnosis not present

## 2020-09-30 DIAGNOSIS — R630 Anorexia: Secondary | ICD-10-CM | POA: Diagnosis not present

## 2020-09-30 DIAGNOSIS — C252 Malignant neoplasm of tail of pancreas: Secondary | ICD-10-CM | POA: Diagnosis not present

## 2020-09-30 DIAGNOSIS — Z1509 Genetic susceptibility to other malignant neoplasm: Secondary | ICD-10-CM | POA: Diagnosis not present

## 2020-09-30 DIAGNOSIS — R634 Abnormal weight loss: Secondary | ICD-10-CM | POA: Diagnosis not present

## 2020-09-30 DIAGNOSIS — G620 Drug-induced polyneuropathy: Secondary | ICD-10-CM | POA: Diagnosis not present

## 2020-09-30 DIAGNOSIS — Z87442 Personal history of urinary calculi: Secondary | ICD-10-CM | POA: Diagnosis not present

## 2020-09-30 DIAGNOSIS — C569 Malignant neoplasm of unspecified ovary: Secondary | ICD-10-CM | POA: Diagnosis not present

## 2020-09-30 DIAGNOSIS — Z8619 Personal history of other infectious and parasitic diseases: Secondary | ICD-10-CM | POA: Diagnosis not present

## 2020-09-30 DIAGNOSIS — Z9221 Personal history of antineoplastic chemotherapy: Secondary | ICD-10-CM | POA: Diagnosis not present

## 2020-09-30 DIAGNOSIS — C541 Malignant neoplasm of endometrium: Secondary | ICD-10-CM | POA: Diagnosis not present

## 2020-09-30 DIAGNOSIS — E8809 Other disorders of plasma-protein metabolism, not elsewhere classified: Secondary | ICD-10-CM | POA: Diagnosis not present

## 2020-10-01 DIAGNOSIS — Z9221 Personal history of antineoplastic chemotherapy: Secondary | ICD-10-CM | POA: Diagnosis not present

## 2020-10-01 DIAGNOSIS — R634 Abnormal weight loss: Secondary | ICD-10-CM | POA: Diagnosis not present

## 2020-10-01 DIAGNOSIS — C252 Malignant neoplasm of tail of pancreas: Secondary | ICD-10-CM | POA: Diagnosis not present

## 2020-10-01 DIAGNOSIS — C541 Malignant neoplasm of endometrium: Secondary | ICD-10-CM | POA: Diagnosis not present

## 2020-10-01 DIAGNOSIS — C189 Malignant neoplasm of colon, unspecified: Secondary | ICD-10-CM | POA: Diagnosis not present

## 2020-10-01 DIAGNOSIS — Z8619 Personal history of other infectious and parasitic diseases: Secondary | ICD-10-CM | POA: Diagnosis not present

## 2020-10-01 DIAGNOSIS — G620 Drug-induced polyneuropathy: Secondary | ICD-10-CM | POA: Diagnosis not present

## 2020-10-01 DIAGNOSIS — R945 Abnormal results of liver function studies: Secondary | ICD-10-CM | POA: Diagnosis not present

## 2020-10-01 DIAGNOSIS — E8809 Other disorders of plasma-protein metabolism, not elsewhere classified: Secondary | ICD-10-CM | POA: Diagnosis not present

## 2020-10-01 DIAGNOSIS — R17 Unspecified jaundice: Secondary | ICD-10-CM | POA: Diagnosis not present

## 2020-10-01 DIAGNOSIS — R630 Anorexia: Secondary | ICD-10-CM | POA: Diagnosis not present

## 2020-10-01 DIAGNOSIS — R110 Nausea: Secondary | ICD-10-CM | POA: Diagnosis not present

## 2020-10-01 DIAGNOSIS — Z87442 Personal history of urinary calculi: Secondary | ICD-10-CM | POA: Diagnosis not present

## 2020-10-01 DIAGNOSIS — C787 Secondary malignant neoplasm of liver and intrahepatic bile duct: Secondary | ICD-10-CM | POA: Diagnosis not present

## 2020-10-01 DIAGNOSIS — C569 Malignant neoplasm of unspecified ovary: Secondary | ICD-10-CM | POA: Diagnosis not present

## 2020-10-01 DIAGNOSIS — Z1509 Genetic susceptibility to other malignant neoplasm: Secondary | ICD-10-CM | POA: Diagnosis not present

## 2020-10-02 DIAGNOSIS — R17 Unspecified jaundice: Secondary | ICD-10-CM | POA: Diagnosis not present

## 2020-10-02 DIAGNOSIS — C787 Secondary malignant neoplasm of liver and intrahepatic bile duct: Secondary | ICD-10-CM | POA: Diagnosis not present

## 2020-10-02 DIAGNOSIS — Z1509 Genetic susceptibility to other malignant neoplasm: Secondary | ICD-10-CM | POA: Diagnosis not present

## 2020-10-02 DIAGNOSIS — Z87442 Personal history of urinary calculi: Secondary | ICD-10-CM | POA: Diagnosis not present

## 2020-10-02 DIAGNOSIS — R110 Nausea: Secondary | ICD-10-CM | POA: Diagnosis not present

## 2020-10-02 DIAGNOSIS — Z9221 Personal history of antineoplastic chemotherapy: Secondary | ICD-10-CM | POA: Diagnosis not present

## 2020-10-02 DIAGNOSIS — R634 Abnormal weight loss: Secondary | ICD-10-CM | POA: Diagnosis not present

## 2020-10-02 DIAGNOSIS — Z8619 Personal history of other infectious and parasitic diseases: Secondary | ICD-10-CM | POA: Diagnosis not present

## 2020-10-02 DIAGNOSIS — C189 Malignant neoplasm of colon, unspecified: Secondary | ICD-10-CM | POA: Diagnosis not present

## 2020-10-02 DIAGNOSIS — C541 Malignant neoplasm of endometrium: Secondary | ICD-10-CM | POA: Diagnosis not present

## 2020-10-02 DIAGNOSIS — C569 Malignant neoplasm of unspecified ovary: Secondary | ICD-10-CM | POA: Diagnosis not present

## 2020-10-02 DIAGNOSIS — C252 Malignant neoplasm of tail of pancreas: Secondary | ICD-10-CM | POA: Diagnosis not present

## 2020-10-02 DIAGNOSIS — G620 Drug-induced polyneuropathy: Secondary | ICD-10-CM | POA: Diagnosis not present

## 2020-10-02 DIAGNOSIS — R630 Anorexia: Secondary | ICD-10-CM | POA: Diagnosis not present

## 2020-10-02 DIAGNOSIS — E8809 Other disorders of plasma-protein metabolism, not elsewhere classified: Secondary | ICD-10-CM | POA: Diagnosis not present

## 2020-10-03 DIAGNOSIS — R110 Nausea: Secondary | ICD-10-CM | POA: Diagnosis not present

## 2020-10-03 DIAGNOSIS — C569 Malignant neoplasm of unspecified ovary: Secondary | ICD-10-CM | POA: Diagnosis not present

## 2020-10-03 DIAGNOSIS — R630 Anorexia: Secondary | ICD-10-CM | POA: Diagnosis not present

## 2020-10-03 DIAGNOSIS — C541 Malignant neoplasm of endometrium: Secondary | ICD-10-CM | POA: Diagnosis not present

## 2020-10-03 DIAGNOSIS — G620 Drug-induced polyneuropathy: Secondary | ICD-10-CM | POA: Diagnosis not present

## 2020-10-03 DIAGNOSIS — E8809 Other disorders of plasma-protein metabolism, not elsewhere classified: Secondary | ICD-10-CM | POA: Diagnosis not present

## 2020-10-03 DIAGNOSIS — C189 Malignant neoplasm of colon, unspecified: Secondary | ICD-10-CM | POA: Diagnosis not present

## 2020-10-03 DIAGNOSIS — Z87442 Personal history of urinary calculi: Secondary | ICD-10-CM | POA: Diagnosis not present

## 2020-10-03 DIAGNOSIS — R17 Unspecified jaundice: Secondary | ICD-10-CM | POA: Diagnosis not present

## 2020-10-03 DIAGNOSIS — Z9221 Personal history of antineoplastic chemotherapy: Secondary | ICD-10-CM | POA: Diagnosis not present

## 2020-10-03 DIAGNOSIS — R634 Abnormal weight loss: Secondary | ICD-10-CM | POA: Diagnosis not present

## 2020-10-03 DIAGNOSIS — Z1509 Genetic susceptibility to other malignant neoplasm: Secondary | ICD-10-CM | POA: Diagnosis not present

## 2020-10-03 DIAGNOSIS — C252 Malignant neoplasm of tail of pancreas: Secondary | ICD-10-CM | POA: Diagnosis not present

## 2020-10-03 DIAGNOSIS — Z8619 Personal history of other infectious and parasitic diseases: Secondary | ICD-10-CM | POA: Diagnosis not present

## 2020-10-03 DIAGNOSIS — C787 Secondary malignant neoplasm of liver and intrahepatic bile duct: Secondary | ICD-10-CM | POA: Diagnosis not present

## 2020-10-04 ENCOUNTER — Encounter (HOSPITAL_COMMUNITY): Payer: Self-pay | Admitting: Emergency Medicine

## 2020-10-04 ENCOUNTER — Other Ambulatory Visit: Payer: Self-pay

## 2020-10-04 ENCOUNTER — Emergency Department (HOSPITAL_COMMUNITY)
Admission: EM | Admit: 2020-10-04 | Discharge: 2020-10-04 | Disposition: A | Discharge status: Home / Self Care | Payer: BC Managed Care – PPO | Attending: Emergency Medicine | Admitting: Emergency Medicine

## 2020-10-04 ENCOUNTER — Emergency Department (HOSPITAL_BASED_OUTPATIENT_CLINIC_OR_DEPARTMENT_OTHER): Payer: BC Managed Care – PPO

## 2020-10-04 DIAGNOSIS — G620 Drug-induced polyneuropathy: Secondary | ICD-10-CM | POA: Diagnosis not present

## 2020-10-04 DIAGNOSIS — R748 Abnormal levels of other serum enzymes: Secondary | ICD-10-CM

## 2020-10-04 DIAGNOSIS — M79606 Pain in leg, unspecified: Secondary | ICD-10-CM | POA: Diagnosis not present

## 2020-10-04 DIAGNOSIS — Z1509 Genetic susceptibility to other malignant neoplasm: Secondary | ICD-10-CM | POA: Diagnosis not present

## 2020-10-04 DIAGNOSIS — E8809 Other disorders of plasma-protein metabolism, not elsewhere classified: Secondary | ICD-10-CM | POA: Diagnosis not present

## 2020-10-04 DIAGNOSIS — C541 Malignant neoplasm of endometrium: Secondary | ICD-10-CM | POA: Diagnosis not present

## 2020-10-04 DIAGNOSIS — C569 Malignant neoplasm of unspecified ovary: Secondary | ICD-10-CM | POA: Diagnosis not present

## 2020-10-04 DIAGNOSIS — Z87891 Personal history of nicotine dependence: Secondary | ICD-10-CM | POA: Insufficient documentation

## 2020-10-04 DIAGNOSIS — C189 Malignant neoplasm of colon, unspecified: Secondary | ICD-10-CM | POA: Diagnosis not present

## 2020-10-04 DIAGNOSIS — R110 Nausea: Secondary | ICD-10-CM | POA: Diagnosis not present

## 2020-10-04 DIAGNOSIS — Z9221 Personal history of antineoplastic chemotherapy: Secondary | ICD-10-CM | POA: Diagnosis not present

## 2020-10-04 DIAGNOSIS — Z79899 Other long term (current) drug therapy: Secondary | ICD-10-CM | POA: Insufficient documentation

## 2020-10-04 DIAGNOSIS — C7961 Secondary malignant neoplasm of right ovary: Secondary | ICD-10-CM | POA: Insufficient documentation

## 2020-10-04 DIAGNOSIS — Z8619 Personal history of other infectious and parasitic diseases: Secondary | ICD-10-CM | POA: Diagnosis not present

## 2020-10-04 DIAGNOSIS — R17 Unspecified jaundice: Secondary | ICD-10-CM | POA: Diagnosis not present

## 2020-10-04 DIAGNOSIS — R630 Anorexia: Secondary | ICD-10-CM | POA: Diagnosis not present

## 2020-10-04 DIAGNOSIS — N189 Chronic kidney disease, unspecified: Secondary | ICD-10-CM | POA: Insufficient documentation

## 2020-10-04 DIAGNOSIS — I82401 Acute embolism and thrombosis of unspecified deep veins of right lower extremity: Secondary | ICD-10-CM | POA: Insufficient documentation

## 2020-10-04 DIAGNOSIS — C252 Malignant neoplasm of tail of pancreas: Secondary | ICD-10-CM

## 2020-10-04 DIAGNOSIS — C787 Secondary malignant neoplasm of liver and intrahepatic bile duct: Secondary | ICD-10-CM | POA: Diagnosis not present

## 2020-10-04 DIAGNOSIS — M79604 Pain in right leg: Secondary | ICD-10-CM

## 2020-10-04 DIAGNOSIS — I82461 Acute embolism and thrombosis of right calf muscular vein: Secondary | ICD-10-CM

## 2020-10-04 DIAGNOSIS — R634 Abnormal weight loss: Secondary | ICD-10-CM | POA: Diagnosis not present

## 2020-10-04 DIAGNOSIS — R7401 Elevation of levels of liver transaminase levels: Secondary | ICD-10-CM | POA: Insufficient documentation

## 2020-10-04 DIAGNOSIS — Z87442 Personal history of urinary calculi: Secondary | ICD-10-CM | POA: Diagnosis not present

## 2020-10-04 MED ORDER — ENOXAPARIN SODIUM 40 MG/0.4ML IJ SOSY: 90.0000 mg | PREFILLED_SYRINGE | Freq: Two times a day (BID) | INTRAMUSCULAR | 0 refills | Status: DC

## 2020-10-04 MED ORDER — ENOXAPARIN SODIUM 300 MG/3ML IJ SOLN: 1.0000 mg/kg | Freq: Once | INTRAMUSCULAR | Status: DC

## 2020-10-04 MED ORDER — ENOXAPARIN SODIUM 100 MG/ML IJ SOSY: 90.0000 mg | PREFILLED_SYRINGE | Freq: Two times a day (BID) | INTRAMUSCULAR | 0 refills | Status: DC

## 2020-10-04 NOTE — Progress Notes (Signed)
Lower extremity venous has been completed.   Preliminary results in CV Proc.   Abram Sander 10/04/2020 10:47 AM

## 2020-10-04 NOTE — ED Provider Notes (Signed)
[facility] DEPT Provider Note   CSN: [id number] Arrival date & time: 10/04/20  9983     History Chief Complaint  Patient presents with   Leg Pain    Dana Hicks is a 45 y.o. female.  Dana Hicks has terminal cancer and also a history of PE.  She was previously taking Xarelto, but recently, her oncologist discontinued this medication since the patient is in liver failure.  2 to 3 days ago, she noticed some calf pain on the right leg.  It has extended to the more proximal aspect of her lower leg.  She presented for evaluation of possible DVT.  She is taking Dilaudid for her cancer pain.  She states that she is otherwise at her baseline, which is chronically ill and pending hospice enrollment.  The history is provided by the patient.  Leg Pain Location:  Leg Leg location:  R lower leg Pain details:    Quality:  Throbbing   Radiates to:  Does not radiate   Severity:  Moderate   Onset quality:  Gradual   Duration:  3 days   Timing:  Constant   Progression:  Worsening Chronicity:  New Dislocation: no   Prior injury to area:  No Relieved by:  Nothing Worsened by:  Nothing Ineffective treatments:  Elevation Associated symptoms: swelling   Associated symptoms: no back pain, no decreased ROM, no fever, no numbness and no tingling       Past Medical History:  Diagnosis Date   Allergic rhinitis, seasonal    Cecal cancer ([place]) 2001   Stage II (T3N0)  04-11-2001cecum-partial colectomy and completed chemo 2002   Depression    Diabetes mellitus without complication ([place])    Endometrial cancer ([place]) 08/2017   Stage IA, Grade 1   GERD (gastroesophageal reflux disease)    History of MRSA infection 01/2017   followed by infectious disease center--  recurrent pustular folliculitis   Nephrolithiasis    bilateral nonobstructive calculi per CT 08-18-2017   OA (osteoarthritis)    Ovarian cancer, right ([place]) 08/2017   Stage II Grade 2  Endometrioid   Pancreatic cancer ([place])    Rheumatoid arthritis [facility])    rheumatologist-  dr devenswar-- treated w/ oral prednisone daily and methotrexate injection every 3 wks    Patient Active Problem List   Diagnosis Date Noted   Liver failure without hepatic coma ([place]) 09/18/2020   Other fatigue 06/24/2020   Acute maxillary sinusitis 06/24/2020   Constipation 06/24/2020   GERD (gastroesophageal reflux disease) 06/24/2020   Hyperglycemia 06/24/2020   Left upper quadrant pain 06/24/2020   Mixed anxiety and depressive disorder 06/24/2020   Transfusion history 06/24/2020   Dyspnea on exertion 06/03/2020   Oral thrush 05/16/2020   Acquired hypogammaglobulinemia ([place]) 02/26/2020   Scalp itch 02/26/2020   Fever 01/28/2020   Mucositis due to chemotherapy 01/25/2020   Chronic midline thoracic back pain 07/09/2019   Cancer associated pain 07/02/2019   Goals of care, counseling/discussion 06/11/2019   Weight loss 05/29/2019   Abdominal pain 04/11/2019   Pancreatitis, acute 04/09/2019   Vaginal dryness, menopausal 01/23/2019   S/P left unicompartmental knee replacement 11/27/2018   UTI (urinary tract infection) 06/29/2018   Pulmonary embolism ([place]) 04/10/2018   Diarrhea due to drug 03/28/2018   Steroid-induced diabetes ([place]) 03/14/2018   Neutropenic fever ([place]) 02/14/2018   Acute rhinitis 02/14/2018   Tachycardia 02/14/2018   Elevated liver enzymes 02/14/2018   Physical debility 02/07/2018   Chemotherapy-induced nausea  02/07/2018   Hot flashes due to surgical menopause 02/06/2018   Anemia due to antineoplastic chemotherapy 12/15/2017   Hypomagnesemia 12/15/2017   Iron deficiency anemia 12/15/2017   S/P splenectomy 12/15/2017   Family history of colon cancer 12/04/2017   Lynch syndrome 11/22/2017   Bilateral chronic knee pain 11/21/2017   Genetic testing 11/18/2017   Calculus of gallbladder without cholecystitis without obstruction 11/10/2017   Inflammatory arthritis  11/08/2017   Dysuria 11/02/2017   Peripheral neuropathy due to chemotherapy ([place]) 10/22/2017   Bone pain 10/21/2017   Malignant neoplasm of tail of pancreas ([place]) 10/17/2017   Family history of uterine cancer 10/17/2017   Sinusitis, chronic 09/15/2017   Malabsorption due to disorder of pancreas 09/15/2017   Primary malignant neoplasm of endometrium ([place]) 08/31/2017   Secondary malignant neoplasm of right ovary ([place]) 65/68/1275   Pustular folliculitis 17/00/1749   MRSA colonization 05/12/2017   Dacrocystitis, left 05/09/2017   Perennial allergic rhinitis 05/09/2017   Seasonal allergies 03/29/2017   Left foot pain 01/10/2017   Osteoarthritis of both knees 12/01/2016   Class 3 severe obesity due to excess calories without serious comorbidity with body mass index (BMI) of 40.0 to 44.9 in adult ([place]) 10/01/2016   High risk medication use 02/06/2016   Family history of rheumatoid arthritis 02/06/2016   H/O seasonal allergies 02/06/2016   Rheumatoid arthritis of multiple sites with negative rheumatoid factor ([place]) 02/03/2016   History of malignant neoplasm of colon 02/03/2016   HLA B27 (HLA B27 positive) 02/03/2016   Malignant neoplasm of colon, unspecified ([place]) 02/23/1999    Past Surgical History:  Procedure Laterality Date   BREAST LUMPECTOMY WITH RADIOACTIVE SEED LOCALIZATION Left 06/28/2014   Benign Procedure: RADIOACTIVE SEED LOCALIZATION LEFT BREAST LUMPECTOMY;  Surgeon: Excell Seltzer, MD;  Location: [facility];  Service: General;  Laterality: Left;   COLONOSCOPY  06/19/14   ESOPHAGOGASTRODUODENOSCOPY (EGD) WITH PROPOFOL N/A 06/07/2019   Procedure: ESOPHAGOGASTRODUODENOSCOPY (EGD) WITH PROPOFOL;  Surgeon: [name], MD;  [facility];  Service: Endoscopy;  Laterality: N/A;   EUS N/A 06/07/2019   Procedure: UPPER ENDOSCOPIC ULTRASOUND (EUS) LINEAR;  Surgeon: [name], MD;  [facility];  Service: Endoscopy;  Laterality: N/A;   EYE  SURGERY Left    plug in tear duct   FINE NEEDLE ASPIRATION N/A 06/07/2019   Procedure: FINE NEEDLE ASPIRATION (FNA) LINEAR;  Surgeon: [name], MD;  [facility];  Service: Endoscopy;  Laterality: N/A;   IR IMAGING GUIDED PORT INSERTION  09/26/2017   KNEE ARTHROSCOPY     PANCREATECTOMY  11/25/2017   PARTIAL KNEE ARTHROPLASTY Left 11/27/2018   Procedure: UNICOMPARTMENTAL KNEE;  Surgeon: Vickey Huger, MD;  Location: [facility];  Service: Orthopedics;  Laterality: Left;   PORT A CATH REVISION  2001   in and out   RIGHT COLECTOMY  06/02/2009   cecum cancer   ROBOTIC ASSISTED TOTAL HYSTERECTOMY WITH BILATERAL SALPINGO OOPHERECTOMY Bilateral 08/30/2017   Procedure: XI ROBOTIC ASSISTED TOTAL  HYSTERECTOMY BILATERAL  SALPINGO OOPHORECTOMY; LYSIS OF ADHESIONS;  Surgeon: Isabel Caprice, MD;  Location[facility];  Service: Gynecology;  Laterality: Bilateral;   SPLENECTOMY, PARTIAL  11/25/2017     OB History   No obstetric history on file.     Family History  Problem Relation Age of Onset   Arthritis/Rheumatoid Mother    Uterine cancer Mother 90   Allergic rhinitis Father    Heart disease Father    Drug abuse Son  Other Maternal Aunt        Unknown GYN CA   Colon cancer Maternal Uncle 68   Rectal cancer Maternal Uncle    Diabetes Maternal Grandmother    Cancer Maternal Grandfather        d. 49s of liver/lung cancer   Angioedema Neg Hx    Asthma Neg Hx    Atopy Neg Hx    Eczema Neg Hx    Immunodeficiency Neg Hx    Urticaria Neg Hx    Esophageal cancer Neg Hx    Stomach cancer Neg Hx     Social History   Tobacco Use   Smoking status: Former    Packs/day: 1.00    Years: 5.00    Pack years: 5.00    Types: Cigarettes    Quit date: 02/05/1998    Years since quitting: 22.6   Smokeless tobacco: Never  Vaping Use   Vaping Use: Never used  Substance Use Topics   Alcohol use: Not Currently   Drug use: No    Home Medications Prior to Admission medications    Medication Sig Start Date End Date Taking? Authorizing Provider  cetirizine (ZYRTEC) 10 MG tablet Take 10 mg by mouth daily.    [provider]  citalopram (CELEXA) 20 MG tablet TAKE 1 TABLET BY MOUTH EVERY DAY 08/19/20   Heath Lark, MD  diclofenac Sodium (VOLTAREN) 1 % GEL APPLY 2-4 GRAMS TO AFFECTED JOINT 4 TIMES DAILY AS NEEDED. 09/03/19   Bo Merino, MD  estradiol (ESTRACE VAGINAL) 0.1 MG/GM vaginal cream Place 1 Applicatorful vaginally 3 (three) times a week. 02/27/20   Heath Lark, MD  fluticasone (FLONASE) 50 MCG/ACT nasal spray Place 1 spray into both nostrils daily.    [provider]  HYDROmorphone (DILAUDID) 8 MG tablet Take 1 tablet (8 mg total) by mouth every 4 (four) hours as needed. 09/17/20   Heath Lark, MD  lidocaine-prilocaine (EMLA) cream Apply 1 application topically daily as needed (port). Apply to affected area once 12/07/18   Heath Lark, MD  metFORMIN (GLUCOPHAGE) 500 MG tablet Take 2 tablets (1,000 mg total) by mouth 2 (two) times daily with a meal. 07/08/20   Heath Lark, MD  methadone (DOLOPHINE) 10 MG tablet Take 1 tablet (10 mg total) by mouth every 12 (twelve) hours. 09/09/20   Heath Lark, MD  Multiple Vitamin (MULTIVITAMIN WITH MINERALS) TABS tablet Take 1 tablet by mouth daily.    [provider]  naproxen (NAPROSYN) 500 MG tablet Take 1 tablet (500 mg total) by mouth 2 (two) times daily with a meal. Patient taking differently: Take 500 mg by mouth as needed. 05/16/20   Heath Lark, MD  omeprazole (PRILOSEC) 40 MG capsule TAKE 1 CAPSULE BY MOUTH EVERY DAY 03/07/20   Alvy Bimler, Ni, MD  ondansetron (ZOFRAN) 8 MG tablet TAKE 1 TAB BY MOUTH EVERY 8HOURS AS NEEDED FOR REFRACTORY NAUSEA/VOMITING START ON DAY 3AFTER CHEMO 09/08/20   Heath Lark, MD  predniSONE (DELTASONE) 5 MG tablet TAKE 2 TABLETS BY MOUTH DAILY WITH BREAKFAST. 09/26/20   Ofilia Neas, PA-C  prochlorperazine (COMPAZINE) 10 MG tablet TAKE 1 TABLET BY MOUTH EVERY 6 HOURS AS NEEDED  01/15/20   Heath Lark, MD  simethicone (MYLICON) 599 MG chewable tablet Chew 125 mg by mouth every 6 (six) hours as needed for flatulence.    [provider]    Allergies    Codeine, Other, Penicillins, and Bactrim [sulfamethoxazole-trimethoprim]  Review of Systems   Review of Systems  Constitutional:  Negative for chills and fever.  HENT:  Negative for ear pain and sore throat.   Eyes:  Negative for pain and visual disturbance.  Respiratory:  Negative for cough and shortness of breath.   Cardiovascular:  Negative for chest pain and palpitations.  Gastrointestinal:  Negative for abdominal pain and vomiting.  Genitourinary:  Negative for dysuria and hematuria.  Musculoskeletal:  Negative for arthralgias and back pain.  Skin:  Negative for color change and rash.  Neurological:  Negative for seizures and syncope.  All other systems reviewed and are negative.  Physical Exam Updated Vital Signs BP (!) 161/112 (BP Location: Left Arm)   Pulse (!) 105   Temp 97.9 F (36.6 C) (Oral)   Resp 18   LMP 08/30/2017   SpO2 96%   Physical Exam Vitals and nursing note reviewed.  HENT:     Head: Normocephalic and atraumatic.  Eyes:     General: No scleral icterus. Pulmonary:     Effort: Pulmonary effort is normal. No respiratory distress.  Musculoskeletal:     Cervical back: Normal range of motion.     Comments: No obvious deformity of the right lower leg.  Mild tenderness to palpation at the proximal aspect of the posterior leg.  No fullness in the popliteal fossa.  Knee range of motion is normal without effusion.  Skin:    General: Skin is warm and dry.     Coloration: Skin is jaundiced.  Neurological:     General: No focal deficit present.     Mental Status: She is alert and oriented to person, place, and time.  Psychiatric:        Mood and Affect: Mood normal.    ED Results / Procedures / Treatments   Labs (all labs ordered are listed, but only abnormal results are  displayed) Labs Reviewed - No data to display  EKG None  Radiology VAS [place] LOWER EXTREMITY VENOUS (DVT) (ONLY [facility] & [facility] 7a-7p)  Result Date: 10/04/2020  Lower Venous DVT Study Patient Name:  ZAINA JENKIN  Date of Exam:   10/04/2020 Medical Rec #: [id number]         Accession #:    [id number] Date of Birth: 03-15-1975          Patient Gender: F Patient Age:   66 years Exam [facility]  [facility] Procedure:      VAS [place] LOWER EXTREMITY VENOUS (DVT) Referring Phys: Lorre Munroe --------------------------------------------------------------------------------  Indications: Pain.  Comparison Study: 11/21/17 prior Performing Technologist: Archie Patten RVS  Examination Guidelines: A complete evaluation includes B-mode imaging, spectral Doppler, color Doppler, and power Doppler as needed of all accessible portions of each vessel. Bilateral testing is considered an integral part of a complete examination. Limited examinations for reoccurring indications may be performed as noted. The reflux portion of the exam is performed with the patient in reverse Trendelenburg.  +---------+---------------+---------+-----------+----------+-----------------+ RIGHT    CompressibilityPhasicitySpontaneityPropertiesThrombus Aging    +---------+---------------+---------+-----------+----------+-----------------+ CFV      Full           Yes      Yes                                    +---------+---------------+---------+-----------+----------+-----------------+ SFJ      Full                                                           +---------+---------------+---------+-----------+----------+-----------------+  FV Prox  Full                                                           +---------+---------------+---------+-----------+----------+-----------------+ FV Mid   Full                                                            +---------+---------------+---------+-----------+----------+-----------------+ FV DistalFull                                                           +---------+---------------+---------+-----------+----------+-----------------+ PFV      Full                                                           +---------+---------------+---------+-----------+----------+-----------------+ POP      Full           Yes      Yes                                    +---------+---------------+---------+-----------+----------+-----------------+ PTV      Full                                                           +---------+---------------+---------+-----------+----------+-----------------+ PERO     Partial                                                        +---------+---------------+---------+-----------+----------+-----------------+ Gastroc  None                                         Age Indeterminate +---------+---------------+---------+-----------+----------+-----------------+   +----+---------------+---------+-----------+----------+--------------+ LEFTCompressibilityPhasicitySpontaneityPropertiesThrombus Aging +----+---------------+---------+-----------+----------+--------------+ CFV Full           Yes      Yes                                 +----+---------------+---------+-----------+----------+--------------+    Summary: RIGHT: - Findings consistent with age indeterminate deep vein thrombosis involving the right gastrocnemius veins. - No cystic structure found in the popliteal fossa.  LEFT: - No evidence of common femoral vein obstruction.  *See table(s) above for measurements and observations.    Preliminary  Procedures Procedures   Medications Ordered in ED Medications - No data to display  ED Course  I have reviewed the triage vital signs and the nursing notes.  Pertinent labs & imaging results that were available during my care of the patient  were reviewed by me and considered in my medical decision making (see chart for details).  Clinical Course as of 10/04/20 1157  Sat Oct 04, 2020  1156 I spoke with pharmacy and the best course of action for anticoagulation given her liver failure.  Lovenox is preferable to warfarin.  The patient is agreeable to taking Lovenox and has given herself injections in the past. [AW]    Clinical Course User Index [AW] Arnaldo Natal, MD   MDM Rules/Calculators/A&P                           [name] presents with signs and symptoms of a DVT.  She does have a history of active cancer and a history of thromboembolic disease in the past.  She is clinically well-appearing with respect to an acute exacerbation of her chronic disease.  However, she is terminally ill.  She was diagnosed with a DVT, and she is interested in anticoagulation.  As recorded above, she has had progressive liver failure and is not a candidate for a direct oral anticoagulant.  She is agreeable to Lovenox, and this was prescribed.  I recommended that she follow-up with her oncologist.  I did not draw labs as she had a recent renal function recorded in the chart.   Final Clinical Impression(s) / ED Diagnoses Final diagnoses:  Acute deep vein thrombosis (DVT) of calf muscle vein of right lower extremity (HCC)  Primary malignant neoplasm of endometrium (HCC)  Secondary malignant neoplasm of right ovary (HCC)  Malignant neoplasm of tail of pancreas (HCC)  Elevated liver enzymes    Rx / DC Orders ED Discharge Orders          Ordered    enoxaparin (LOVENOX) 40 MG/0.4ML injection  Every 12 hours        10/04/20 1156             Arnaldo Natal, MD 10/04/20 1200

## 2020-10-04 NOTE — ED Triage Notes (Signed)
Patient reports right calf pain x2-3 days. She states she recently was placed on hospice and had her medications including xarelto d/c 1 week ago.   She reports she is typically sedentary and sits on a recliner throughout the day d/t dyspnea upon exertion. Reports having a PE in the past.

## 2020-10-05 DIAGNOSIS — Z1509 Genetic susceptibility to other malignant neoplasm: Secondary | ICD-10-CM | POA: Diagnosis not present

## 2020-10-05 DIAGNOSIS — R17 Unspecified jaundice: Secondary | ICD-10-CM | POA: Diagnosis not present

## 2020-10-05 DIAGNOSIS — C787 Secondary malignant neoplasm of liver and intrahepatic bile duct: Secondary | ICD-10-CM | POA: Diagnosis not present

## 2020-10-05 DIAGNOSIS — C252 Malignant neoplasm of tail of pancreas: Secondary | ICD-10-CM | POA: Diagnosis not present

## 2020-10-05 DIAGNOSIS — C569 Malignant neoplasm of unspecified ovary: Secondary | ICD-10-CM | POA: Diagnosis not present

## 2020-10-05 DIAGNOSIS — R110 Nausea: Secondary | ICD-10-CM | POA: Diagnosis not present

## 2020-10-05 DIAGNOSIS — R630 Anorexia: Secondary | ICD-10-CM | POA: Diagnosis not present

## 2020-10-05 DIAGNOSIS — G620 Drug-induced polyneuropathy: Secondary | ICD-10-CM | POA: Diagnosis not present

## 2020-10-05 DIAGNOSIS — E8809 Other disorders of plasma-protein metabolism, not elsewhere classified: Secondary | ICD-10-CM | POA: Diagnosis not present

## 2020-10-05 DIAGNOSIS — Z9221 Personal history of antineoplastic chemotherapy: Secondary | ICD-10-CM | POA: Diagnosis not present

## 2020-10-05 DIAGNOSIS — R634 Abnormal weight loss: Secondary | ICD-10-CM | POA: Diagnosis not present

## 2020-10-05 DIAGNOSIS — Z87442 Personal history of urinary calculi: Secondary | ICD-10-CM | POA: Diagnosis not present

## 2020-10-05 DIAGNOSIS — C189 Malignant neoplasm of colon, unspecified: Secondary | ICD-10-CM | POA: Diagnosis not present

## 2020-10-05 DIAGNOSIS — C541 Malignant neoplasm of endometrium: Secondary | ICD-10-CM | POA: Diagnosis not present

## 2020-10-05 DIAGNOSIS — Z8619 Personal history of other infectious and parasitic diseases: Secondary | ICD-10-CM | POA: Diagnosis not present

## 2020-10-06 ENCOUNTER — Encounter: Payer: Self-pay | Admitting: Hematology and Oncology

## 2020-10-06 ENCOUNTER — Telehealth: Payer: Self-pay

## 2020-10-06 ENCOUNTER — Telehealth (HOSPITAL_BASED_OUTPATIENT_CLINIC_OR_DEPARTMENT_OTHER): Payer: BC Managed Care – PPO | Admitting: Hematology and Oncology

## 2020-10-06 DIAGNOSIS — R634 Abnormal weight loss: Secondary | ICD-10-CM | POA: Diagnosis not present

## 2020-10-06 DIAGNOSIS — C252 Malignant neoplasm of tail of pancreas: Secondary | ICD-10-CM

## 2020-10-06 DIAGNOSIS — C189 Malignant neoplasm of colon, unspecified: Secondary | ICD-10-CM | POA: Diagnosis not present

## 2020-10-06 DIAGNOSIS — Z87442 Personal history of urinary calculi: Secondary | ICD-10-CM | POA: Diagnosis not present

## 2020-10-06 DIAGNOSIS — I82461 Acute embolism and thrombosis of right calf muscular vein: Secondary | ICD-10-CM

## 2020-10-06 DIAGNOSIS — R17 Unspecified jaundice: Secondary | ICD-10-CM | POA: Diagnosis not present

## 2020-10-06 DIAGNOSIS — I82401 Acute embolism and thrombosis of unspecified deep veins of right lower extremity: Secondary | ICD-10-CM | POA: Insufficient documentation

## 2020-10-06 DIAGNOSIS — K7200 Acute and subacute hepatic failure without coma: Secondary | ICD-10-CM | POA: Diagnosis not present

## 2020-10-06 DIAGNOSIS — R630 Anorexia: Secondary | ICD-10-CM | POA: Diagnosis not present

## 2020-10-06 DIAGNOSIS — C787 Secondary malignant neoplasm of liver and intrahepatic bile duct: Secondary | ICD-10-CM | POA: Diagnosis not present

## 2020-10-06 DIAGNOSIS — G620 Drug-induced polyneuropathy: Secondary | ICD-10-CM | POA: Diagnosis not present

## 2020-10-06 DIAGNOSIS — C541 Malignant neoplasm of endometrium: Secondary | ICD-10-CM | POA: Diagnosis not present

## 2020-10-06 DIAGNOSIS — Z8619 Personal history of other infectious and parasitic diseases: Secondary | ICD-10-CM | POA: Diagnosis not present

## 2020-10-06 DIAGNOSIS — Z1509 Genetic susceptibility to other malignant neoplasm: Secondary | ICD-10-CM | POA: Diagnosis not present

## 2020-10-06 DIAGNOSIS — E8809 Other disorders of plasma-protein metabolism, not elsewhere classified: Secondary | ICD-10-CM | POA: Diagnosis not present

## 2020-10-06 DIAGNOSIS — C569 Malignant neoplasm of unspecified ovary: Secondary | ICD-10-CM | POA: Diagnosis not present

## 2020-10-06 DIAGNOSIS — R110 Nausea: Secondary | ICD-10-CM | POA: Diagnosis not present

## 2020-10-06 DIAGNOSIS — Z9221 Personal history of antineoplastic chemotherapy: Secondary | ICD-10-CM | POA: Diagnosis not present

## 2020-10-06 NOTE — Assessment & Plan Note (Signed)
I have reviewed her ultrasound report She is symptomatic with calf pain The age of DVT is indeterminate She is at high risk of DVT due to her cancer diagnosis but at the same time, she is also at high risk of bleeding due to progressive liver failure For now, I felt that Lovenox is the best choice to treat her blood clot She will continue Lovenox as prescribed twice daily At this for signs of bleeding, she is advised to stop Lovenox and to call me immediately

## 2020-10-06 NOTE — Assessment & Plan Note (Signed)
She is jaundiced She has progressive liver failure and according to the patient, might have abdominal ascites We discussed the risk and benefits of therapeutic paracentesis At this point in time, she does not believe she needs paracentesis I warned her about risk of bleeding due to progressive liver failure

## 2020-10-06 NOTE — Telephone Encounter (Signed)
Called and given below message. Scheduled appt at 4 pm today for virtual visit. She is aware of appt.  Called Mitzi, Hospice nurse at [phone] and given message virtual visit today at 4 pm..

## 2020-10-06 NOTE — Telephone Encounter (Signed)
Mitzi nurse with [name] called and left a message. Dana Hicks went to ER 8/13, She has a DVT and was started on Lovenox injections. Marajade ask that the hospice nurse call Dr. Alvy Bimler and she is asking if she has any options besides Lovenox injections. She will take the injections but would prefer oral.

## 2020-10-06 NOTE — Progress Notes (Signed)
HEMATOLOGY-ONCOLOGY ELECTRONIC VISIT PROGRESS NOTE  Patient Care Team: Heath Lark, MD as PCP - General (Hematology and Oncology)  I connected with by [facility] video conference and verified that I am speaking with the correct person using two identifiers.  I discussed the limitations, risks, security and privacy concerns of performing an evaluation and management service by [facility] and the availability of in person appointments.  I also discussed with the patient that there may be a patient responsible charge related to this service. The patient expressed understanding and agreed to proceed.   ASSESSMENT & PLAN:  Right leg DVT ([place]) I have reviewed her ultrasound report She is symptomatic with calf pain The age of DVT is indeterminate She is at high risk of DVT due to her cancer diagnosis but at the same time, she is also at high risk of bleeding due to progressive liver failure For now, I felt that Lovenox is the best choice to treat her blood clot She will continue Lovenox as prescribed twice daily At this for signs of bleeding, she is advised to stop Lovenox and to call me immediately  Malignant neoplasm of tail of pancreas [facility]) She is currently on palliative care with hospice Continue supportive care  Liver failure without hepatic coma ([place]) She is jaundiced She has progressive liver failure and according to the patient, might have abdominal ascites We discussed the risk and benefits of therapeutic paracentesis At this point in time, she does not believe she needs paracentesis I warned her about risk of bleeding due to progressive liver failure  No orders of the defined types were placed in this encounter.   INTERVAL HISTORY: Please see below for problem oriented charting. 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)  08/18/2017 Imaging   Ct scan abdomen and pelvis 1. Mixed attenuation mass emanates from the right adnexa measuring 10.8 x 8.0 cm very suspicious for right ovarian carcinoma. 2. Abnormality of the tail of the pancreas may be due to mild pancreatitis and small pseudocyst formation, but neoplasm cannot be excluded. 3. Small amount of ascites within abdomen and pelvis. 4. Small nonobstructing renal calculi bilaterally.     08/30/2017 Pathology Results   1. Uterus and cervix, with left fallopian tube - ENDOMETRIOID ADENOCARCINOMA, FIGO GRADE I, ARISING IN A BACKGROUND OF DIFFUSE COMPLEX ATYPICAL HYPERPLASIA. - CARCINOMA INVADES FOR OF DEPTH OF 0.2 CM WHERE THICKNESS OF MYOMETRIAL WALL IS 2.1 CM. - ALL RESECTION MARGINS ARE NEGATIVE FOR CARCINOMA. - NEGATIVE FOR LYMPHOVASCULAR OR PERINEURAL INVASION. - CERVICAL STROMA IS NOT INVOLVED. - SEE ONCOLOGY TABLE. - SEE NOTE 2. Ovary and fallopian tube, right - PRIMARY OVARIAN ENDOMETRIOID ADENOCARCINOMA, FIGO GRADE II, 12 CM. - THE OVARIAN SURFACE IS FOCALLY INVOLVED BY CARCINOMA. - NEGATIVE FOR LYMPHOVASCULAR INVASION. - BENIGN UNREMARKABLE FALLOPIAN TUBE, NEGATIVE FOR CARCINOMA. - SEE ONCOLOGY TABLE. - SEE NOTE 3. Cul-de-sac biopsy - METASTATIC ADENOCARCINOMA, MOST CONSISTENT WITH PRIMARY OVARIAN ENDOMETRIOID ADENOCARCINOMA. 4. Ovary, left - BENIGN UNREMARKABLE OVARY, NEGATIVE FOR MALIGNANCY. Microscopic Comment  1. UTERUS, CARCINOMA OR CARCINOSARCOMA Procedure: Total hysterectomy with bilateral salpingo-oophorectomy. Histologic type: Endometrioid  adenocarcinoma. Histologic Grade: FIGO Grade I Myometrial invasion: Depth of invasion: 2 mm Myometrial thickness: 21 mm Uterine Serosa Involvement: Not identified Cervical stromal involvement: Not identified Extent of involvement of other organs: Not applicable Lymphovascular invasion: Not identified Regional Lymph Nodes: Examined: 0 Sentinel 0 Non-sentinel 0 Total Tumor block for ancillary studies: 1H MMR / MSI testing: Pending Pathologic Stage Classification (pTNM, AJCC 8th edition): pT1a, pNX (v4.1.0.0) 2. OVARY or FALLOPIAN TUBE or PRIMARY PERITONEUM: Procedure: Salpingo-oophorectomy Specimen Integrity: Intact Tumor Site: Right ovary Ovarian Surface Involvement (required only if applicable): Focally involved by carcinoma Fallopian Tube Surface Involvement (required only if applicable): Not identified Tumor Size: 12 cm Histologic Type: Endometrioid adenocarcinoma Histologic Grade: Grade II Implants (required for advanced stage serous/seromucinous borderline tumors only): Not applicable Other Tissue/ Organ Involvement: Cul de sac biopsy involved by tumor Largest Extrapelvic Peritoneal Focus (required only if applicable): Not applicable Peritoneal/Ascitic Fluid: Negative for carcinoma (case # [id number]) Treatment Effect (required only for high-grade serous carcinomas): Not applicable Regional Lymph Nodes: No lymph nodes submitted or found Number of Lymph Nodes Examined: 0 Pathologic Stage Classification (pTNM, AJCC 8th Edition): pT2b, pN0 Representative Tumor Block: 2B and 2E 1. Molecular study for microsatellite instability and immunohistochemical stains for MMR-related proteins are pending and will be reported in an addendum. 2. Immunohistochemical stain show that the ovarian tumor is positive for CK7 and PAX8 (both diffuse), CDX2 (patchy and weak); and negative for CK20. This immunoprofile is consistent with the above diagnosis. Dr. Lyndon Code has reviewed this case and concurs  with the above diagnosis. Molecular study for microsatellite instability and immunohistochemical stains for MMR-related proteins are pending and will be reported in an addendum   08/30/2017 Genetic Testing   Patient has genetic testing done for MSI  Results revealed patient has the following mutation(s): loss of [facility] 2   08/30/2017 Surgery   Surgeon: Mart Piggs, MD Pre-operative Diagnosis:  Adnexal mass Abnormal uterine bleeding H/o Cecal CA   Post-operative Diagnosis:  Adhesive disease post colon resection Endometrial cancer NOS Adenocarcinoma unknown origin, right ovary, suspicious for GI primary   Operation:  Lysis of adhesions ~30 minutes Robotic-assisted laparoscopic total hysterectomy with right salpingo-oophorectomy and left salpingectomy Left oophorectomy (RA-laparoscopic) Pelvic washings    Findings: Adhesions of omentum to anterior abdominal wall. Enlarged cystic right ovary ~10cm. Uterus had small nodules on serosa near where right adnexa was intimate with the surface. No obvious intraoperative rupture of cyst, although in 2 areas the wall was thin and one of these areas had some bleeding. Slight scarring of left bladder dome to LUS/cervix. Uterus on frozen section c/w hyperplasia and a small focus of endometrial CA - no myo invasion, <2cm in size. Frozen section on the right adnexa was carcinoma, met from colon or possibly Gyn, favor GI primary, defer to permanent. Left ovary was WNL.     09/15/2017 Cancer Staging   Staging form: Corpus Uteri - Carcinoma and Carcinosarcoma, AJCC 8th Edition - Pathologic: FIGO Stage IA (pT1a, pN0, cM0) - Signed by Heath Lark, MD on 09/15/2017   09/26/2017 Procedure   Successful placement of a right internal jugular approach power injectable Port-A-Cath. The catheter is ready for immediate use.   11/07/2017 Imaging   1. Since 08/18/2017, similar to slight decrease in size of a pancreatic body/tail junction lesion. Cross modality  comparison relative to 09/16/2017 MRI is also grossly similar. 2. No evidence of metastatic disease.  3.  Aortic Atherosclerosis (ICD10-I70.0).  4. Left nephrolithiasis.   11/18/2017 Genetic Testing   MSH2 c.2237dupT pathogenic mutation identified in the CancerNext panel.  The CancerNext gene panel offered by [employer] includes sequencing and rearrangement analysis for the following 34 genes:   APC, ATM, BARD1, BMPR1A, BRCA1, BRCA2, BRIP1, CDH1, CDK4, CDKN2A, CHEK2, DICER1, HOXB13, EPCAM, GREM1, MLH1, MRE11A, MSH2, MSH6, MUTYH, NBN, NF1, PALB2, PMS2, POLD1, POLE, PTEN, RAD50, RAD51C, RAD51D, SMAD4, SMARCA4, STK11, and TP53.  The report date is November 18, 2017.  MSH2 [id number] pathogenic mutation identified on somatic testing.  These results are consistent with a diagnosis of Lynch syndrome.   06/18/2019 - 08/14/2019 Chemotherapy   The patient had FOLFIRINOX for chemotherapy treatment.     01/19/2020 - 08/19/2020 Chemotherapy          04/11/2020 Imaging   1. Essentially stable appearance of the soft tissue density process along the distal pancreatectomy site, abutting the posterior portion of the stomach, celiac trunk, left lateral margin of the SMA, and potentially the superior margin of the left renal vein. This could be inflammatory but residual tumor not excluded. 2. Some of the liver lesions are mildly reduced in size and some of the liver lesions are stable in size. 3. Further reduction in omental nodularity. 4. Small paraumbilical hernia contains a loop of small bowel, without findings of strangulation or obstruction. 5. Small supraumbilical hernias containing adipose tissue. 6. Small nonobstructive left renal calculus.   09/18/2020 -  Chemotherapy    Patient is on Treatment Plan: PANCREATIC MSI-H/DMMR PEMBROLIZUMAB Q21D       Secondary malignant neoplasm of right ovary ([place])  08/23/2017 Tumor Marker   Patient's tumor was tested for the following markers:  CA-125 Results of the tumor marker test revealed 139.8   08/31/2017 Initial Diagnosis   Secondary malignant neoplasm of right ovary ([place])   09/15/2017 Cancer Staging   Staging form: Ovary, Fallopian Tube, and Primary Peritoneal Carcinoma, AJCC 8th Edition - Pathologic: Stage IIB (pT2b, pN0, cM0) - Signed by Heath Lark, MD on 09/15/2017   09/27/2017 Imaging   No evidence of metastatic disease or other acute findings within the thorax.   4 cm low-attenuation mass in pancreatic tail, highly suspicious for pancreatic carcinoma. This is caused splenic vein thrombosis, with new venous collaterals in the left upper quadrant. Consider endoscopic ultrasound with FNA for tissue diagnosis.   Stable benign hepatic hemangioma.     09/27/2017 Tumor Marker   Patient's tumor was tested for the following markers: CA-125 Results of the tumor marker test revealed 39.4   11/02/2017 Tumor Marker   Patient's tumor was tested for the following markers: CA-125 Results of the tumor marker test revealed 19   11/07/2017 Tumor Marker   Patient's tumor was tested for the following markers: CA-125 Results of the tumor marker test revealed 18.3   11/18/2017 Genetic Testing   MSH2 c.2237dupT pathogenic mutation identified in the CancerNext panel.  The CancerNext gene panel offered by [employer] includes sequencing and rearrangement analysis for the following 34 genes:   APC, ATM, BARD1, BMPR1A, BRCA1, BRCA2, BRIP1, CDH1, CDK4, CDKN2A, CHEK2, DICER1, HOXB13, EPCAM, GREM1, MLH1, MRE11A, MSH2, MSH6, MUTYH, NBN, NF1, PALB2, PMS2, POLD1, POLE, PTEN, RAD50, RAD51C, RAD51D, SMAD4, SMARCA4, STK11, and TP53.  The report date is November 18, 2017.  MSH2 [id number] pathogenic mutation identified on somatic testing.  These results are consistent with a diagnosis of Lynch syndrome.   12/15/2017 Tumor Marker   Patient's tumor was tested for  the following markers: CA-125 Results of the tumor marker test revealed  57.3   01/16/2018 Tumor Marker   Patient's tumor was tested for the following markers: CA-125 Results of the tumor marker test revealed 24.2   04/10/2018 Tumor Marker   Patient's tumor was tested for the following markers: CA-125 Results of the tumor marker test revealed 18.2   07/12/2018 Tumor Marker   Patient's tumor was tested for the following markers: CA-125 Results of the tumor marker test revealed 11.8   10/16/2018 Tumor Marker   Patient's tumor was tested for the following markers: CA125 Results of the tumor marker test revealed 12.4.   01/18/2020 Tumor Marker   Patient's tumor was tested for the following markers: CA-125 Results of the tumor marker test revealed 18   Malignant neoplasm of tail of pancreas ([place])  10/13/2017 Pathology Results   Pancreas tail mass, endoscopic ultrasound-guided, fine needle aspiration II (smears and cell block):      Adenocarcinoma   11/18/2017 Genetic Testing   MSH2 c.2237dupT pathogenic mutation identified in the CancerNext panel.  The CancerNext gene panel offered by [employer] includes sequencing and rearrangement analysis for the following 34 genes:   APC, ATM, BARD1, BMPR1A, BRCA1, BRCA2, BRIP1, CDH1, CDK4, CDKN2A, CHEK2, DICER1, HOXB13, EPCAM, GREM1, MLH1, MRE11A, MSH2, MSH6, MUTYH, NBN, NF1, PALB2, PMS2, POLD1, POLE, PTEN, RAD50, RAD51C, RAD51D, SMAD4, SMARCA4, STK11, and TP53.  The report date is November 18, 2017.  MSH2 c.[id number] pathogenic mutation identified on somatic testing.  These results are consistent with a diagnosis of Lynch syndrome.   11/24/2017 Pathology Results   A.  "TAIL OF PANCREAS AND SPLEEN", DISTAL PANCREATECTOMY AND SPLENECTOMY:      Invasive ductal adenocarcinoma, moderately to poorly differentiated with focal signet ring cell features, of pancreas (distal).         The carcinoma is 2.5 cm in greatest dimension grossly.          Treatment effects present in the form of fibrosis (50%).      No  lymphovascular or definite perineural invasion identified.      All surgical margins are negative for tumor or high-grade dysplasia.      Adjacent mucinous neoplasm, most consistent with Intraductal papillary mucinous neoplasm (IPMN) with low-grade dysplasia.         Uninvolved pancreas show atrophy and focal acute inflammation.          Twelve benign lymph nodes (0/12).      Spleen with no significant histopathologic abnormalities.  PROCEDURE: distal pancreatectomy and splenectomy TUMOR SITE: distal pancreas TUMOR SIZE:   GREATEST DIMENSION: 2.5  cm   ADDITIONAL DIMENSIONS:   x   cm HISTOLOGIC TYPE: ductal adenocarcinoma HISTOLOGIC GRADE: grade 3 TUMOR EXTENSION: peripancreatic soft tissue MARGINS: negative for tumor TREATMENT EFFECT:  treatment effects present in the form of fibrosis (50%). LYMPHOVASCULAR INVASION: not identified PERINEURAL INVASION: no definite evidence REGIONAL LYMPH NODES:     NUMBER OF LYMPH NODES INVOLVED: 0     NUMBER OF LYMPH NODES EXAMINED: 12 PATHOLOGIC STAGE CLASSIFICATION (pTNM, AJCC 8th Ed): ypT2, ypN0 DISTANT METASTASIS (pM):  pMx ADDITIONAL PATHOLOGIC FINDINGS: mucinous neoplasm, most consistent with intraductal papillary mucinous neoplasm (IPMN) with low-grade dysplasia, is identified adjacent to the main tumor.   11/24/2017 Cancer Staging   Staging form: Exocrine Pancreas, AJCC 8th Edition - Pathologic stage from 11/24/2017: Stage IB (pT2, pN0, cM0) - Signed by Truitt Merle, MD on 01/03/2018   11/25/2017 Surgery   She had surgery at [facility]  1. Exploratory Laparotomy 2. Distal Pancreatectomy and Splenectomy 3. Intraoperative Ultrasound 4. Open Cholecystectomy    12/15/2017 Cancer Staging   Staging form: Exocrine Pancreas, AJCC 8th Edition - Clinical: Stage IB (cT2, cN0, cM0) - Signed by Heath Lark, MD on 12/15/2017   12/28/2017 Imaging   12/28/2017 CT Abdomen   IMPRESSION: 1. Postoperative findings from recent partial pancreatectomy  including a 21 cubic cm fluid collection along the pancreatic resection margin which could represent early pseudocyst. 2. Nodularity along the lateral limb of the left adrenal gland could also be postoperative but merit surveillance, as the pancreatic lesion was in close proximity to this adrenal gland on the prior CT of 11/07/2017. 3. Asymmetric fullness inferiorly in the left breast. The patient has a history of prior left breast procedures, correlation with mammography is recommended. 4. Other imaging findings of potential clinical significance: Stable hemangioma in the left hepatic lobe. Splenectomy. Aortic Atherosclerosis (ICD10-I70.0). Right hemicolectomy. Small focus of fat necrosis in the right anterior abdominal wall subcutaneous tissues near the laparotomy site. Bilateral nonobstructive nephrolithiasis.   01/02/2018 Tumor Marker   Patient's tumor was tested for the following markers: CA-19-9 Results of the tumor marker test revealed 5   01/03/2018 - 06/05/2018 Chemotherapy   She received modified dose FOLFIRINOX   04/10/2018 Imaging   1. Distal pancreatectomy, without findings of recurrent or metastatic disease. 2. Incompletely imaged hypoenhancement within lower lobe right pulmonary artery branch is likely chronic (but interval since 10/09/2017) pulmonary embolism. Dedicated CTA could further evaluate. 3. Decrease in size of a peripancreatic complex fluid collection anteriorly, likely a resolving pseudocyst. 4. Decreased size of minimal fluid versus a borderline sized node in the gastrohepatic ligament. Recommend attention on follow-up. 5. Hepatic steatosis with a segment 4 hemangioma. 6. Aortic Atherosclerosis (ICD10-I70.0). This is significantly age advanced.   07/12/2018 Tumor Marker   Patient's tumor was tested for the following markers: CA-19-9 Results of the tumor marker test revealed 6   07/12/2018 Imaging   1. Status post distal pancreatectomy with stable postoperative fluid  collection adjacent to the ventral aspect of the pancreatic head. No findings to suggest metastatic disease in the abdomen or pelvis. 2. Hepatic steatosis with small cavernous hemangioma in segment 4A of the liver. 3. Slight decreased size of nonenlarged gastrohepatic ligament lymph node, presumably benign. 4. Aortic atherosclerosis.     10/16/2018 Imaging   Status post distal pancreatectomy. Stable postoperative seroma along the surgical margin.   Status post hysterectomy and right oophorectomy.   Status post right hemicolectomy with appendectomy.   No evidence of recurrent or metastatic disease.   No colonic wall thickening or mass is evident on CT.   01/22/2019 Tumor Marker   Patient's tumor was tested for the following markers: CA-19.9 Results of the tumor marker test revealed 5   01/22/2019 Imaging   1. No evidence of local recurrence or metastatic disease status post distal pancreatectomy and splenectomy. 2. Stable small seroma anterior to the pancreatic head. 3. Postsurgical changes as described. 4. Stable additional incidental findings including a hepatic hemangioma, nonobstructing bilateral renal calculi and aortic Atherosclerosis (ICD10-I70.0).   04/18/2019 Imaging   1. Status post distal pancreatectomy and splenectomy. There has been a gradual increase in ill-defined soft tissue and celiac axis/SMA origin lymph nodes adjacent to the distal pancreatectomy margin and lesser curvature of the stomach/gastric body over sequential prior examinations dated 01/22/2019 and 09/26/2018. An enlarged lymph node or soft tissue nodule adjacent to the SMA origin now measures 1.8 x 0.8 cm.  Findings are concerning for local recurrence of pancreatic malignancy. 2. Separately from the above findings, there remains a 1.0 cm low-attenuation nodule anterior to the remnant pancreatic neck, most consistent with postoperative seroma 3. No evidence of distant metastatic disease in the chest, abdomen,  or pelvis. 4. Postoperative findings of cholecystectomy, right hemicolectomy, and hysterectomy. 5. Bilateral nonobstructive nephrolithiasis. 6.  Aortic Atherosclerosis (ICD10-I70.0).       05/28/2019 Tumor Marker   Patient's tumor was tested for the following markers: CA19-9 Results of the tumor marker test revealed 18   05/28/2019 Imaging   1. Status post distal pancreatectomy with continued further progression of the ill-defined soft tissue to the left of the celiac axis/SMA origin, now measuring 1.9 x 1.9 cm and highly concerning for recurrent/metastatic disease. This process abuts both the celiac axis, SMA, and posterior wall of the mid stomach. PET-CT may prove helpful to further evaluate. 2. Bandlike ill-defined soft tissue in the anterior abdomen, potentially peritoneal or omental is similar to perhaps minimally increased qualitatively in the interval. Close attention on follow-up recommended. 3. Left paraumbilical hernia contains a short segment of small bowel without complicating features. 4. Ill-defined very subtle area of decreased attenuation in the posterior aspect of hepatic segment IV. This is likely focal fatty deposition. Close attention on follow-up recommended. 5.  Aortic Atherosclerois (ICD10-170.0)   06/07/2019 Pathology Results   Malignant cells consistent with adenocarcinoma   06/07/2019 Procedure   ENDOSCOPIC FINDING (limited views with linear echoendoscope): :      The examined esophagus was endoscopically normal.      The entire examined stomach was endoscopically normal.      ENDOSONOGRAPHIC FINDING: :      1. An irregular mass was identified in the region of the pancreatic tail resection site. The mass was stellate with very poorly defined borders. Fine needle aspiration for cytology was performed. Color Doppler imaging was utilized prior to needle puncture to confirm a lack of significant vascular structures within the needle path. Four passes were made with the 25  gauge (FNB) needle using a transgastric approach. A cytotechnologist was present to evaluate the adequacy of the specimen. Final cytology results are pending. Impression:               -  Irregular, stellate soft tissue mass in the region of the pancreatic tail resection site. This was sampled with four transgastric passes with an EUS FNB needle..   06/18/2019 - 08/14/2019 Chemotherapy   The patient had FOLFIRINOX for chemotherapy treatment.     09/03/2019 Imaging   1. Findings are again highly concerning for locally recurrent disease adjacent to the pancreatectomy bed, with enlarging soft tissue mass which is intimately associated with the superior mesenteric artery, celiac axis and superior aspect of the left renal  vein, as detailed above. 2. No evidence of metastatic disease in the thorax. 3. 2.7 x 1.2 cm cavernous hemangioma in segment 4A of the liver again noted. 4. Small nonobstructive calculi in the collecting systems of both kidneys measuring up to 3 mm in the lower pole collecting system of the left kidney. 5. Aortic atherosclerosis. 6. Additional incidental findings, as above.   11/16/2019 Imaging   Similar-appearing ill-defined soft tissue the left of the celiac axis/SMA, most compatible with recurrent/metastatic disease.   Similar ill-defined nodularity within the anterior upper abdomen, potentially peritoneal or omental. Continued attention on follow-up is recommended.   12/31/2019 Tumor Marker   Patient's tumor was tested for the following markers: CA-19-9 Results  of the tumor marker test revealed 298   01/10/2020 Imaging   1. Numerous small hypoenhancing liver masses scattered throughout the liver, all new since 11/16/2019 CT, compatible with new liver metastases. 2. Stable upper retroperitoneal mass encasing the celiac axis and abutting the pancreatic resection margin, compatible with stable locally recurrent tumor. 3. Stable subcentimeter indistinct anterior upper peritoneal  implants. 4. No evidence of metastatic disease in the chest. 5. Chronic findings include: Nonobstructing left nephrolithiasis. Stable small left paraumbilical hernia containing a small bowel loop without acute bowel complication. Aortic Atherosclerosis (ICD10-I70.0).     01/19/2020 - 08/19/2020 Chemotherapy          01/25/2020 Tumor Marker   Patient's tumor was tested for the following markers: CA-19-9 Results of the tumor marker test revealed 1104.   03/04/2020 Tumor Marker   Patient's tumor was tested for the following markers: CA-19-9 Results of the tumor marker test revealed 581   04/11/2020 Imaging   1. Essentially stable appearance of the soft tissue density process along the distal pancreatectomy site, abutting the posterior portion of the stomach, celiac trunk, left lateral margin of the SMA, and potentially the superior margin of the left renal vein. This could be inflammatory but residual tumor not excluded. 2. Some of the liver lesions are mildly reduced in size and some of the liver lesions are stable in size. 3. Further reduction in omental nodularity. 4. Small paraumbilical hernia contains a loop of small bowel, without findings of strangulation or obstruction. 5. Small supraumbilical hernias containing adipose tissue. 6. Small nonobstructive left renal calculus.   04/11/2020 Tumor Marker   Patient's tumor was tested for the following markers: CA19-9 Results of the tumor marker test revealed 451   05/27/2020 Imaging   1. Stable chest CT, without acute findings or evidence of metastatic disease. 2. Assessment of the patient's liver disease is limited by progressive geographic hepatic steatosis. There is one central hepatic lesion which appears slightly more conspicuous, and this may be contributing to new intrahepatic biliary dilatation posteriorly in the right lobe. The other hepatic lesions are generally improved. No extrahepatic biliary dilatation. 3. Stable soft tissue  thickening along the distal pancreatectomy site which may reflect tumor or fibrosis. No apparent residual peritoneal disease. 4. Stable postsurgical changes and small hernias of the anterior abdominal wall.   05/28/2020 Tumor Marker   Patient's tumor was tested for the following markers: CA19-9 Results of the tumor marker test revealed 3097   06/24/2020 Tumor Marker   Patient's tumor was tested for the following marker CA19-9. Results of the tumor marker test revealed 4304   07/25/2020 Tumor Marker   Patient's tumor was tested for the following markers: CA-125 Results of the tumor marker test revealed 49.1   07/28/2020 Tumor Marker   Patient's tumor was tested for the following markers: CA-19-9 Results of the tumor marker test revealed 6501   09/01/2020 Imaging   IMPRESSION: 1. Interval progression of several low-density lesions in the liver with new tiny low-density liver lesions evident on today's study. Features are concerning for metastatic disease. PET-CT may prove helpful to further evaluate (see below). The mild posterior right intrahepatic biliary duct dilatation is stable. 2. Stable appearance of the soft tissue attenuation along the distal pancreatectomy resection bed, between the stomach and the celiac axis/SMA. There is some subtle soft tissue attenuation to the left of the SMA, new/more prominent today than on the prior study. Attention on follow-up recommended. PET-CT may prove helpful to assess this region. 3.  Stable appearance of the cavernous hemangioma in the anterior right liver. 4. Tiny nonobstructing left renal stone.   09/01/2020 Tumor Marker   Patient's tumor was tested for the following markers: CA19-9. Results of the tumor marker test revealed 12707.   09/18/2020 -  Chemotherapy    Patient is on Treatment Plan: PANCREATIC MSI-H/DMMR PEMBROLIZUMAB Q21D         REVIEW OF SYSTEMS:   Constitutional: Denies fevers, chills or abnormal weight loss Eyes: Denies  blurriness of vision Ears, nose, mouth, throat, and face: Denies mucositis or sore throat Respiratory: Denies cough, dyspnea or wheezes Cardiovascular: Denies palpitation, chest discomfort Gastrointestinal:  Denies nausea, heartburn or change in bowel habits Skin: Denies abnormal skin rashes Lymphatics: Denies new lymphadenopathy or easy bruising Neurological:Denies numbness, tingling or new weaknesses Behavioral/Psych: Mood is stable, no new changes  Extremities: No lower extremity edema All other systems were reviewed with the patient and are negative.  I have reviewed the past medical history, past surgical history, social history and family history with the patient and they are unchanged from previous note.  ALLERGIES:  is allergic to codeine, other, penicillins, and bactrim [sulfamethoxazole-trimethoprim].  MEDICATIONS:  Current Outpatient Medications  Medication Sig Dispense Refill   cetirizine (ZYRTEC) 10 MG tablet Take 10 mg by mouth daily. (Patient not taking: Reported on 10/04/2020)     citalopram (CELEXA) 20 MG tablet TAKE 1 TABLET BY MOUTH EVERY DAY (Patient taking differently: Take 20 mg by mouth daily.) 90 tablet 4   diclofenac Sodium (VOLTAREN) 1 % GEL APPLY 2-4 GRAMS TO AFFECTED JOINT 4 TIMES DAILY AS NEEDED. (Patient taking differently: Apply 2 g topically 4 (four) times daily. Apply 2-4 grams to affected joint 4 times daily as needed.) 400 g 1   diphenhydrAMINE (BENADRYL) 25 MG tablet Take 25 mg by mouth every 6 (six) hours as needed for itching.     enoxaparin (LOVENOX) 100 MG/ML injection Inject 0.9 mLs (90 mg total) into the skin every 12 (twelve) hours. 54 mL 0   estradiol (ESTRACE VAGINAL) 0.1 MG/GM vaginal cream Place 1 Applicatorful vaginally 3 (three) times a week. (Patient taking differently: Place 1 Applicatorful vaginally 3 (three) times a week. Monday Wednesday friday) 42.5 g 12   fluticasone (FLONASE) 50 MCG/ACT nasal spray Place 1 spray into both nostrils  daily.     HYDROmorphone (DILAUDID) 8 MG tablet Take 1 tablet (8 mg total) by mouth every 4 (four) hours as needed. 90 tablet 0   hydrOXYzine (ATARAX/VISTARIL) 10 MG tablet Take 10 mg by mouth in the morning and at bedtime.     lidocaine-prilocaine (EMLA) cream Apply 1 application topically daily as needed (port). Apply to affected area once 30 g 11   metFORMIN (GLUCOPHAGE) 500 MG tablet Take 2 tablets (1,000 mg total) by mouth 2 (two) times daily with a meal. 180 tablet 11   methadone (DOLOPHINE) 10 MG tablet Take 1 tablet (10 mg total) by mouth every 12 (twelve) hours. 60 tablet 0   Multiple Vitamin (MULTIVITAMIN WITH MINERALS) TABS tablet Take 1 tablet by mouth daily.     omeprazole (PRILOSEC) 40 MG capsule TAKE 1 CAPSULE BY MOUTH EVERY DAY (Patient taking differently: Take 40 mg by mouth daily.) 90 capsule 3   ondansetron (ZOFRAN) 8 MG tablet TAKE 1 TAB BY MOUTH EVERY 8HOURS AS NEEDED FOR REFRACTORY NAUSEA/VOMITING START ON DAY 3AFTER CHEMO (Patient taking differently: Take 8 mg by mouth every 8 (eight) hours as needed for nausea.) 8 tablet 7   prochlorperazine (  COMPAZINE) 10 MG tablet TAKE 1 TABLET BY MOUTH EVERY 6 HOURS AS NEEDED (Patient taking differently: Take 10 mg by mouth every 6 (six) hours as needed.) 90 tablet 3   simethicone (MYLICON) 409 MG chewable tablet Chew 125 mg by mouth every 6 (six) hours as needed for flatulence.     No current facility-administered medications for this visit.    PHYSICAL EXAMINATION: She appears jaundiced ECOG PERFORMANCE STATUS: 1 - Symptomatic but completely ambulatory  LABORATORY DATA:  I have reviewed the data as listed CMP Latest Ref Rng & Units 09/18/2020 09/01/2020 08/19/2020  Glucose 70 - 99 mg/dL 147(H) 110(H) 134(H)  BUN 6 - 20 mg/dL '9 11 13  ' Creatinine 0.44 - 1.00 mg/dL 0.70 0.70 0.70  Sodium 135 - 145 mmol/L 134(L) 140 140  Potassium 3.5 - 5.1 mmol/L 3.6 4.1 4.2  Chloride 98 - 111 mmol/L 99 103 106  CO2 22 - 32 mmol/L '26 26 24  ' Calcium  8.9 - 10.3 mg/dL 8.7(L) 9.3 8.6(L)  Total Protein 6.5 - 8.1 g/dL 5.9(L) 6.8 6.3(L)  Total Bilirubin 0.3 - 1.2 mg/dL 14.6(HH) 3.4(H) 0.5  Alkaline Phos 38 - 126 U/L 654(H) 410(H) 319(H)  AST 15 - 41 U/L 234(HH) 175(HH) 96(H)  ALT 0 - 44 U/L 233(H) 249(H) 109(H)    Lab Results  Component Value Date   WBC 11.5 (H) 09/18/2020   HGB 11.9 (L) 09/18/2020   HCT 36.7 09/18/2020   MCV 101.1 (H) 09/18/2020   PLT 263 09/18/2020   NEUTROABS 8.3 (H) 09/18/2020     RADIOGRAPHIC STUDIES: I have personally reviewed the radiological images as listed and agreed with the findings in the report. VAS [place] LOWER EXTREMITY VENOUS (DVT) (ONLY [facility] & [facility] 7a-7p)  Result Date: 10/04/2020  Lower Venous DVT Study Patient Name:  Dana Hicks  Date of Exam:   10/04/2020 Medical Rec #: [id number]         Accession #:    [id number] Date of Birth: February 28, 1975          Patient Gender: F Patient Age:   45 years Exam Location:  [facility] Procedure:      VAS [place] LOWER EXTREMITY VENOUS (DVT) Referring Phys: Lorre Munroe --------------------------------------------------------------------------------  Indications: Pain.  Comparison Study: 11/21/17 prior Performing Technologist: Archie Patten RVS  Examination Guidelines: A complete evaluation includes B-mode imaging, spectral Doppler, color Doppler, and power Doppler as needed of all accessible portions of each vessel. Bilateral testing is considered an integral part of a complete examination. Limited examinations for reoccurring indications may be performed as noted. The reflux portion of the exam is performed with the patient in reverse Trendelenburg.  +---------+---------------+---------+-----------+----------+-----------------+ RIGHT    CompressibilityPhasicitySpontaneityPropertiesThrombus Aging    +---------+---------------+---------+-----------+----------+-----------------+ CFV      Full           Yes      Yes                                     +---------+---------------+---------+-----------+----------+-----------------+ SFJ      Full                                                           +---------+---------------+---------+-----------+----------+-----------------+ FV Prox  Full                                                           +---------+---------------+---------+-----------+----------+-----------------+  FV Mid   Full                                                           +---------+---------------+---------+-----------+----------+-----------------+ FV DistalFull                                                           +---------+---------------+---------+-----------+----------+-----------------+ PFV      Full                                                           +---------+---------------+---------+-----------+----------+-----------------+ POP      Full           Yes      Yes                                    +---------+---------------+---------+-----------+----------+-----------------+ PTV      Full                                                           +---------+---------------+---------+-----------+----------+-----------------+ PERO     Partial                                                        +---------+---------------+---------+-----------+----------+-----------------+ Gastroc  None                                         Age Indeterminate +---------+---------------+---------+-----------+----------+-----------------+   +----+---------------+---------+-----------+----------+--------------+ LEFTCompressibilityPhasicitySpontaneityPropertiesThrombus Aging +----+---------------+---------+-----------+----------+--------------+ CFV Full           Yes      Yes                                 +----+---------------+---------+-----------+----------+--------------+    Summary: RIGHT: - Findings consistent with age indeterminate deep vein thrombosis  involving the right gastrocnemius veins. - No cystic structure found in the popliteal fossa.  LEFT: - No evidence of common femoral vein obstruction.  *See table(s) above for measurements and observations. Electronically signed by Harold Barban MD on 10/04/2020 at 1:22:18 PM.    Final     I discussed the assessment and treatment plan with the patient. The patient was provided an opportunity to ask questions and all were answered. The patient agreed with the plan and demonstrated an understanding of the instructions. The patient was advised to call back or seek an in-person evaluation if the symptoms  worsen or if the condition fails to improve as anticipated.    I spent 20 minutes for the appointment reviewing test results, discuss management and coordination of care.  Heath Lark, MD 10/06/2020 5:03 PM

## 2020-10-06 NOTE — Telephone Encounter (Signed)
Either eliquis or xarelto is ok BUT due to her liver failure she could be at risk of bleeding I suggest scheduling virtual visit at 4 pm today, add to my calendar for me to discuss with [facility]

## 2020-10-06 NOTE — Assessment & Plan Note (Signed)
She is currently on palliative care with hospice Continue supportive care

## 2020-10-07 DIAGNOSIS — C569 Malignant neoplasm of unspecified ovary: Secondary | ICD-10-CM | POA: Diagnosis not present

## 2020-10-07 DIAGNOSIS — R110 Nausea: Secondary | ICD-10-CM | POA: Diagnosis not present

## 2020-10-07 DIAGNOSIS — Z8619 Personal history of other infectious and parasitic diseases: Secondary | ICD-10-CM | POA: Diagnosis not present

## 2020-10-07 DIAGNOSIS — G620 Drug-induced polyneuropathy: Secondary | ICD-10-CM | POA: Diagnosis not present

## 2020-10-07 DIAGNOSIS — R634 Abnormal weight loss: Secondary | ICD-10-CM | POA: Diagnosis not present

## 2020-10-07 DIAGNOSIS — Z87442 Personal history of urinary calculi: Secondary | ICD-10-CM | POA: Diagnosis not present

## 2020-10-07 DIAGNOSIS — C189 Malignant neoplasm of colon, unspecified: Secondary | ICD-10-CM | POA: Diagnosis not present

## 2020-10-07 DIAGNOSIS — Z9221 Personal history of antineoplastic chemotherapy: Secondary | ICD-10-CM | POA: Diagnosis not present

## 2020-10-07 DIAGNOSIS — C541 Malignant neoplasm of endometrium: Secondary | ICD-10-CM | POA: Diagnosis not present

## 2020-10-07 DIAGNOSIS — E8809 Other disorders of plasma-protein metabolism, not elsewhere classified: Secondary | ICD-10-CM | POA: Diagnosis not present

## 2020-10-07 DIAGNOSIS — C787 Secondary malignant neoplasm of liver and intrahepatic bile duct: Secondary | ICD-10-CM | POA: Diagnosis not present

## 2020-10-07 DIAGNOSIS — C252 Malignant neoplasm of tail of pancreas: Secondary | ICD-10-CM | POA: Diagnosis not present

## 2020-10-07 DIAGNOSIS — R630 Anorexia: Secondary | ICD-10-CM | POA: Diagnosis not present

## 2020-10-07 DIAGNOSIS — R17 Unspecified jaundice: Secondary | ICD-10-CM | POA: Diagnosis not present

## 2020-10-07 DIAGNOSIS — Z1509 Genetic susceptibility to other malignant neoplasm: Secondary | ICD-10-CM | POA: Diagnosis not present

## 2020-10-08 DIAGNOSIS — E8809 Other disorders of plasma-protein metabolism, not elsewhere classified: Secondary | ICD-10-CM | POA: Diagnosis not present

## 2020-10-08 DIAGNOSIS — G620 Drug-induced polyneuropathy: Secondary | ICD-10-CM | POA: Diagnosis not present

## 2020-10-08 DIAGNOSIS — C787 Secondary malignant neoplasm of liver and intrahepatic bile duct: Secondary | ICD-10-CM | POA: Diagnosis not present

## 2020-10-08 DIAGNOSIS — C569 Malignant neoplasm of unspecified ovary: Secondary | ICD-10-CM | POA: Diagnosis not present

## 2020-10-08 DIAGNOSIS — Z9221 Personal history of antineoplastic chemotherapy: Secondary | ICD-10-CM | POA: Diagnosis not present

## 2020-10-08 DIAGNOSIS — R630 Anorexia: Secondary | ICD-10-CM | POA: Diagnosis not present

## 2020-10-08 DIAGNOSIS — R17 Unspecified jaundice: Secondary | ICD-10-CM | POA: Diagnosis not present

## 2020-10-08 DIAGNOSIS — C541 Malignant neoplasm of endometrium: Secondary | ICD-10-CM | POA: Diagnosis not present

## 2020-10-08 DIAGNOSIS — C189 Malignant neoplasm of colon, unspecified: Secondary | ICD-10-CM | POA: Diagnosis not present

## 2020-10-08 DIAGNOSIS — C252 Malignant neoplasm of tail of pancreas: Secondary | ICD-10-CM | POA: Diagnosis not present

## 2020-10-08 DIAGNOSIS — Z87442 Personal history of urinary calculi: Secondary | ICD-10-CM | POA: Diagnosis not present

## 2020-10-08 DIAGNOSIS — Z8619 Personal history of other infectious and parasitic diseases: Secondary | ICD-10-CM | POA: Diagnosis not present

## 2020-10-08 DIAGNOSIS — Z1509 Genetic susceptibility to other malignant neoplasm: Secondary | ICD-10-CM | POA: Diagnosis not present

## 2020-10-08 DIAGNOSIS — R634 Abnormal weight loss: Secondary | ICD-10-CM | POA: Diagnosis not present

## 2020-10-08 DIAGNOSIS — R110 Nausea: Secondary | ICD-10-CM | POA: Diagnosis not present

## 2020-10-09 DIAGNOSIS — Z9221 Personal history of antineoplastic chemotherapy: Secondary | ICD-10-CM | POA: Diagnosis not present

## 2020-10-09 DIAGNOSIS — C787 Secondary malignant neoplasm of liver and intrahepatic bile duct: Secondary | ICD-10-CM | POA: Diagnosis not present

## 2020-10-09 DIAGNOSIS — C189 Malignant neoplasm of colon, unspecified: Secondary | ICD-10-CM | POA: Diagnosis not present

## 2020-10-09 DIAGNOSIS — Z87442 Personal history of urinary calculi: Secondary | ICD-10-CM | POA: Diagnosis not present

## 2020-10-09 DIAGNOSIS — R17 Unspecified jaundice: Secondary | ICD-10-CM | POA: Diagnosis not present

## 2020-10-09 DIAGNOSIS — G620 Drug-induced polyneuropathy: Secondary | ICD-10-CM | POA: Diagnosis not present

## 2020-10-09 DIAGNOSIS — R634 Abnormal weight loss: Secondary | ICD-10-CM | POA: Diagnosis not present

## 2020-10-09 DIAGNOSIS — Z8619 Personal history of other infectious and parasitic diseases: Secondary | ICD-10-CM | POA: Diagnosis not present

## 2020-10-09 DIAGNOSIS — C569 Malignant neoplasm of unspecified ovary: Secondary | ICD-10-CM | POA: Diagnosis not present

## 2020-10-09 DIAGNOSIS — C541 Malignant neoplasm of endometrium: Secondary | ICD-10-CM | POA: Diagnosis not present

## 2020-10-09 DIAGNOSIS — Z1509 Genetic susceptibility to other malignant neoplasm: Secondary | ICD-10-CM | POA: Diagnosis not present

## 2020-10-09 DIAGNOSIS — R630 Anorexia: Secondary | ICD-10-CM | POA: Diagnosis not present

## 2020-10-09 DIAGNOSIS — C252 Malignant neoplasm of tail of pancreas: Secondary | ICD-10-CM | POA: Diagnosis not present

## 2020-10-09 DIAGNOSIS — R110 Nausea: Secondary | ICD-10-CM | POA: Diagnosis not present

## 2020-10-09 DIAGNOSIS — E8809 Other disorders of plasma-protein metabolism, not elsewhere classified: Secondary | ICD-10-CM | POA: Diagnosis not present

## 2020-10-10 ENCOUNTER — Telehealth: Payer: Self-pay

## 2020-10-10 DIAGNOSIS — Z8619 Personal history of other infectious and parasitic diseases: Secondary | ICD-10-CM | POA: Diagnosis not present

## 2020-10-10 DIAGNOSIS — C569 Malignant neoplasm of unspecified ovary: Secondary | ICD-10-CM | POA: Diagnosis not present

## 2020-10-10 DIAGNOSIS — Z1509 Genetic susceptibility to other malignant neoplasm: Secondary | ICD-10-CM | POA: Diagnosis not present

## 2020-10-10 DIAGNOSIS — R110 Nausea: Secondary | ICD-10-CM | POA: Diagnosis not present

## 2020-10-10 DIAGNOSIS — C252 Malignant neoplasm of tail of pancreas: Secondary | ICD-10-CM | POA: Diagnosis not present

## 2020-10-10 DIAGNOSIS — E8809 Other disorders of plasma-protein metabolism, not elsewhere classified: Secondary | ICD-10-CM | POA: Diagnosis not present

## 2020-10-10 DIAGNOSIS — R630 Anorexia: Secondary | ICD-10-CM | POA: Diagnosis not present

## 2020-10-10 DIAGNOSIS — R17 Unspecified jaundice: Secondary | ICD-10-CM | POA: Diagnosis not present

## 2020-10-10 DIAGNOSIS — C787 Secondary malignant neoplasm of liver and intrahepatic bile duct: Secondary | ICD-10-CM | POA: Diagnosis not present

## 2020-10-10 DIAGNOSIS — Z9221 Personal history of antineoplastic chemotherapy: Secondary | ICD-10-CM | POA: Diagnosis not present

## 2020-10-10 DIAGNOSIS — G620 Drug-induced polyneuropathy: Secondary | ICD-10-CM | POA: Diagnosis not present

## 2020-10-10 DIAGNOSIS — R634 Abnormal weight loss: Secondary | ICD-10-CM | POA: Diagnosis not present

## 2020-10-10 DIAGNOSIS — Z87442 Personal history of urinary calculi: Secondary | ICD-10-CM | POA: Diagnosis not present

## 2020-10-10 DIAGNOSIS — C541 Malignant neoplasm of endometrium: Secondary | ICD-10-CM | POA: Diagnosis not present

## 2020-10-10 DIAGNOSIS — C189 Malignant neoplasm of colon, unspecified: Secondary | ICD-10-CM | POA: Diagnosis not present

## 2020-10-10 NOTE — Telephone Encounter (Signed)
Returned her call. She thinks the blood clot has moved up her thigh to the groin area. She was having pain in the knee area and now the pain is in the groin area is why she thinks the clot has moved. Denies missing doses of Lovenox. Instructed to continue Lovenox as prescibed and elevate legs. She does not need any refills at this time and will call the office back if needed.

## 2020-10-11 ENCOUNTER — Emergency Department (HOSPITAL_BASED_OUTPATIENT_CLINIC_OR_DEPARTMENT_OTHER): Payer: BC Managed Care – PPO

## 2020-10-11 ENCOUNTER — Emergency Department (HOSPITAL_COMMUNITY): Payer: BC Managed Care – PPO

## 2020-10-11 ENCOUNTER — Encounter (HOSPITAL_COMMUNITY): Payer: Self-pay

## 2020-10-11 ENCOUNTER — Other Ambulatory Visit: Payer: Self-pay

## 2020-10-11 ENCOUNTER — Emergency Department (HOSPITAL_COMMUNITY)
Admission: EM | Admit: 2020-10-11 | Discharge: 2020-10-11 | Disposition: A | Discharge status: Home / Self Care | Payer: BC Managed Care – PPO | Attending: Emergency Medicine | Admitting: Emergency Medicine

## 2020-10-11 DIAGNOSIS — Z8542 Personal history of malignant neoplasm of other parts of uterus: Secondary | ICD-10-CM | POA: Insufficient documentation

## 2020-10-11 DIAGNOSIS — Z8619 Personal history of other infectious and parasitic diseases: Secondary | ICD-10-CM | POA: Diagnosis not present

## 2020-10-11 DIAGNOSIS — R17 Unspecified jaundice: Secondary | ICD-10-CM | POA: Diagnosis not present

## 2020-10-11 DIAGNOSIS — I82401 Acute embolism and thrombosis of unspecified deep veins of right lower extremity: Secondary | ICD-10-CM | POA: Diagnosis not present

## 2020-10-11 DIAGNOSIS — Z8543 Personal history of malignant neoplasm of ovary: Secondary | ICD-10-CM | POA: Insufficient documentation

## 2020-10-11 DIAGNOSIS — Z85038 Personal history of other malignant neoplasm of large intestine: Secondary | ICD-10-CM | POA: Insufficient documentation

## 2020-10-11 DIAGNOSIS — R1031 Right lower quadrant pain: Secondary | ICD-10-CM | POA: Diagnosis not present

## 2020-10-11 DIAGNOSIS — I2699 Other pulmonary embolism without acute cor pulmonale: Secondary | ICD-10-CM | POA: Insufficient documentation

## 2020-10-11 DIAGNOSIS — E119 Type 2 diabetes mellitus without complications: Secondary | ICD-10-CM | POA: Insufficient documentation

## 2020-10-11 DIAGNOSIS — R630 Anorexia: Secondary | ICD-10-CM | POA: Diagnosis not present

## 2020-10-11 DIAGNOSIS — Z7901 Long term (current) use of anticoagulants: Secondary | ICD-10-CM | POA: Diagnosis not present

## 2020-10-11 DIAGNOSIS — Z1509 Genetic susceptibility to other malignant neoplasm: Secondary | ICD-10-CM | POA: Diagnosis not present

## 2020-10-11 DIAGNOSIS — C569 Malignant neoplasm of unspecified ovary: Secondary | ICD-10-CM | POA: Diagnosis not present

## 2020-10-11 DIAGNOSIS — E8809 Other disorders of plasma-protein metabolism, not elsewhere classified: Secondary | ICD-10-CM | POA: Diagnosis not present

## 2020-10-11 DIAGNOSIS — C189 Malignant neoplasm of colon, unspecified: Secondary | ICD-10-CM | POA: Diagnosis not present

## 2020-10-11 DIAGNOSIS — Z8507 Personal history of malignant neoplasm of pancreas: Secondary | ICD-10-CM | POA: Diagnosis not present

## 2020-10-11 DIAGNOSIS — R000 Tachycardia, unspecified: Secondary | ICD-10-CM | POA: Insufficient documentation

## 2020-10-11 DIAGNOSIS — I82461 Acute embolism and thrombosis of right calf muscular vein: Secondary | ICD-10-CM | POA: Insufficient documentation

## 2020-10-11 DIAGNOSIS — C787 Secondary malignant neoplasm of liver and intrahepatic bile duct: Secondary | ICD-10-CM | POA: Diagnosis not present

## 2020-10-11 DIAGNOSIS — Z7984 Long term (current) use of oral hypoglycemic drugs: Secondary | ICD-10-CM | POA: Insufficient documentation

## 2020-10-11 DIAGNOSIS — C252 Malignant neoplasm of tail of pancreas: Secondary | ICD-10-CM | POA: Diagnosis not present

## 2020-10-11 DIAGNOSIS — R634 Abnormal weight loss: Secondary | ICD-10-CM | POA: Diagnosis not present

## 2020-10-11 DIAGNOSIS — D72829 Elevated white blood cell count, unspecified: Secondary | ICD-10-CM | POA: Diagnosis not present

## 2020-10-11 DIAGNOSIS — Z87891 Personal history of nicotine dependence: Secondary | ICD-10-CM | POA: Diagnosis not present

## 2020-10-11 DIAGNOSIS — Z9221 Personal history of antineoplastic chemotherapy: Secondary | ICD-10-CM | POA: Diagnosis not present

## 2020-10-11 DIAGNOSIS — J90 Pleural effusion, not elsewhere classified: Secondary | ICD-10-CM | POA: Diagnosis not present

## 2020-10-11 DIAGNOSIS — I10 Essential (primary) hypertension: Secondary | ICD-10-CM | POA: Insufficient documentation

## 2020-10-11 DIAGNOSIS — C541 Malignant neoplasm of endometrium: Secondary | ICD-10-CM | POA: Diagnosis not present

## 2020-10-11 DIAGNOSIS — R0602 Shortness of breath: Secondary | ICD-10-CM | POA: Diagnosis not present

## 2020-10-11 DIAGNOSIS — R110 Nausea: Secondary | ICD-10-CM | POA: Diagnosis not present

## 2020-10-11 DIAGNOSIS — Z87442 Personal history of urinary calculi: Secondary | ICD-10-CM | POA: Diagnosis not present

## 2020-10-11 DIAGNOSIS — J9811 Atelectasis: Secondary | ICD-10-CM | POA: Diagnosis not present

## 2020-10-11 DIAGNOSIS — G620 Drug-induced polyneuropathy: Secondary | ICD-10-CM | POA: Diagnosis not present

## 2020-10-11 LAB — CBC WITH DIFFERENTIAL/PLATELET
Abs Immature Granulocytes: 0.27 10*3/uL — ABNORMAL HIGH (ref 0.00–0.07)
Basophils Absolute: 0.1 10*3/uL (ref 0.0–0.1)
Basophils Relative: 1 %
Eosinophils Absolute: 0.1 10*3/uL (ref 0.0–0.5)
Eosinophils Relative: 0 %
HCT: 34.3 % — ABNORMAL LOW (ref 36.0–46.0)
Hemoglobin: 11.1 g/dL — ABNORMAL LOW (ref 12.0–15.0)
Immature Granulocytes: 1 %
Lymphocytes Relative: 15 %
Lymphs Abs: 3 10*3/uL (ref 0.7–4.0)
MCH: 32.9 pg (ref 26.0–34.0)
MCHC: 32.4 g/dL (ref 30.0–36.0)
MCV: 101.8 fL — ABNORMAL HIGH (ref 80.0–100.0)
Monocytes Absolute: 2.1 10*3/uL — ABNORMAL HIGH (ref 0.1–1.0)
Monocytes Relative: 11 %
Neutro Abs: 13.8 10*3/uL — ABNORMAL HIGH (ref 1.7–7.7)
Neutrophils Relative %: 72 %
Platelets: 375 10*3/uL (ref 150–400)
RBC: 3.37 MIL/uL — ABNORMAL LOW (ref 3.87–5.11)
RDW: 16.7 % — ABNORMAL HIGH (ref 11.5–15.5)
WBC: 19.2 10*3/uL — ABNORMAL HIGH (ref 4.0–10.5)
nRBC: 0 % (ref 0.0–0.2)

## 2020-10-11 LAB — COMPREHENSIVE METABOLIC PANEL
ALT: 86 U/L — ABNORMAL HIGH (ref 0–44)
AST: 154 U/L — ABNORMAL HIGH (ref 15–41)
Albumin: 2 g/dL — ABNORMAL LOW (ref 3.5–5.0)
Alkaline Phosphatase: 459 U/L — ABNORMAL HIGH (ref 38–126)
Anion gap: 10 (ref 5–15)
BUN: 8 mg/dL (ref 6–20)
CO2: 26 mmol/L (ref 22–32)
Calcium: 8.3 mg/dL — ABNORMAL LOW (ref 8.9–10.3)
Chloride: 98 mmol/L (ref 98–111)
Creatinine, Ser: 0.4 mg/dL — ABNORMAL LOW (ref 0.44–1.00)
GFR, Estimated: >60 mL/min (ref >60)
Glucose, Bld: 101 mg/dL — ABNORMAL HIGH (ref 70–99)
Potassium: 3.7 mmol/L (ref 3.5–5.1)
Sodium: 134 mmol/L — ABNORMAL LOW (ref 135–145)
Total Bilirubin: 15.7 mg/dL — ABNORMAL HIGH (ref 0.3–1.2)
Total Protein: 5.5 g/dL — ABNORMAL LOW (ref 6.5–8.1)

## 2020-10-11 MED ORDER — HYDROMORPHONE HCL 2 MG/ML IJ SOLN
2.0000 mg | Freq: Once | INTRAMUSCULAR | Status: AC
  Administered 2020-10-11: 2 mg via INTRAVENOUS
  Filled 2020-10-11: qty 1

## 2020-10-11 MED ORDER — IOHEXOL 350 MG/ML SOLN
80.0000 mL | Freq: Once | INTRAVENOUS | Status: AC | PRN
  Administered 2020-10-11: 80 mL via INTRAVENOUS

## 2020-10-11 NOTE — ED Triage Notes (Addendum)
Pt arrived via POV, states known blood clot in R calf. Concern for mvmt, states pain has moved from calf to groin area. Pain worsening over the past two days. Was d/c on lovenox, has not missed any doses.  Currently on hospice.

## 2020-10-11 NOTE — Discharge Instructions (Addendum)
You came to the emergency department today to be evaluated for your right groin pain.  The ultrasound of your leg showed that the blood clot has not moved from your right calf.  There was no additional blood clots found in your right lower extremity.  The CT scan of your chest did show that you have Small segmental pulmonary embolus.  Please continue to take your Lovenox medication.  Please follow-up with Dr. Alvy Bimler.    Get help right away if: You have: New or increased pain, swelling, warmth, or redness in an arm or leg. Shortness of breath that gets worse during activity or at rest. Worsening chest pain. A rapid or irregular heartbeat. A severe headache. Vision changes. A serious fall or accident, or you hit your head. Blood in your vomit, stool, or urine. A cut that will not stop bleeding. You cough up blood. You feel light-headed or dizzy, and that feeling does not go away. You cannot move your arms or legs. You are confused or have memory loss.

## 2020-10-11 NOTE — Progress Notes (Signed)
RLE venous duplex has been completed.  Preliminary results given to Vicente Males, RN.  Results can be found under chart review under CV PROC. 10/11/2020 3:27 PM Hamsini Verrilli RVT, RDMS

## 2020-10-11 NOTE — ED Provider Notes (Signed)
[facility] DEPT Provider Note   CSN: [id number] Arrival date & time: 10/11/20  7793     History Chief Complaint  Patient presents with   Groin Pain   Blood Clot    OMIE FERGER is a 45 y.o. female malignant neoplasm of tail of pancreas (currently on palliative care with hospice), liver failure without hepatic coma, right leg DVT (Dx on 10/04/20, currently on Lovenox), diabetes mellitus.  Presents emergency department with a chief complaint of left groin pain.  Patient reports that she was diagnosed with a DVT on 8/13.  Patient presented to the emergency department that day she was having pain to right calf.  Patient reports that pain has migrated up her right leg from there.  Pain moved to right groin yesterday.  Pain has Progressively worse over time.  Pain is worse with movement.  At rest patient rates pain 2/10 on the pain scale, with movement 7/10 on the pain scale.  Patient has had improvement in her pain with prescribed Dilaudid.  Patient has been taking Lovenox as prescribed.  Patient denies any recent falls or injuries.  Patient denies any swelling to lower extremity, rash, erythema, hemoptysis, chest pain.  Patient reports that she has shortness of breath at baseline.  Patient denies any change in her shortness of breath.   Groin Pain Associated symptoms include shortness of breath (at baseline). Pertinent negatives include no chest pain.      Past Medical History:  Diagnosis Date   Allergic rhinitis, seasonal    Cecal cancer ([place]) 2001   Stage II (T3N0)  04-11-2001cecum-partial colectomy and completed chemo 2002   Depression    Diabetes mellitus without complication ([place])    Endometrial cancer ([place]) 08/2017   Stage IA, Grade 1   GERD (gastroesophageal reflux disease)    History of MRSA infection 01/2017   followed by infectious disease center--  recurrent pustular folliculitis   Nephrolithiasis    bilateral nonobstructive calculi per CT  08-18-2017   OA (osteoarthritis)    Ovarian cancer, right ([place]) 08/2017   Stage II Grade 2 Endometrioid   Pancreatic cancer ([place])    Rheumatoid arthritis [facility])    rheumatologist-  dr devenswar-- treated w/ oral prednisone daily and methotrexate injection every 3 wks    Patient Active Problem List   Diagnosis Date Noted   Right leg DVT ([place]) 10/06/2020   Liver failure without hepatic coma ([place]) 09/18/2020   Other fatigue 06/24/2020   Acute maxillary sinusitis 06/24/2020   Constipation 06/24/2020   GERD (gastroesophageal reflux disease) 06/24/2020   Hyperglycemia 06/24/2020   Left upper quadrant pain 06/24/2020   Mixed anxiety and depressive disorder 06/24/2020   Transfusion history 06/24/2020   Dyspnea on exertion 06/03/2020   Oral thrush 05/16/2020   Acquired hypogammaglobulinemia ([place]) 02/26/2020   Scalp itch 02/26/2020   Fever 01/28/2020   Mucositis due to chemotherapy 01/25/2020   Chronic midline thoracic back pain 07/09/2019   Cancer associated pain 07/02/2019   Goals of care, counseling/discussion 06/11/2019   Weight loss 05/29/2019   Abdominal pain 04/11/2019   Pancreatitis, acute 04/09/2019   Vaginal dryness, menopausal 01/23/2019   S/P left unicompartmental knee replacement 11/27/2018   UTI (urinary tract infection) 06/29/2018   Pulmonary embolism ([place]) 04/10/2018   Diarrhea due to drug 03/28/2018   Steroid-induced diabetes ([place]) 03/14/2018   Neutropenic fever ([place]) 02/14/2018   Acute rhinitis 02/14/2018   Tachycardia 02/14/2018   Elevated liver enzymes 02/14/2018   Physical debility 02/07/2018  Chemotherapy-induced nausea 02/07/2018   Hot flashes due to surgical menopause 02/06/2018   Anemia due to antineoplastic chemotherapy 12/15/2017   Hypomagnesemia 12/15/2017   Iron deficiency anemia 12/15/2017   S/P splenectomy 12/15/2017   Family history of colon cancer 12/04/2017   Lynch syndrome 11/22/2017   Bilateral chronic knee pain 11/21/2017   Genetic  testing 11/18/2017   Calculus of gallbladder without cholecystitis without obstruction 11/10/2017   Inflammatory arthritis 11/08/2017   Dysuria 11/02/2017   Peripheral neuropathy due to chemotherapy ([place]) 10/22/2017   Bone pain 10/21/2017   Malignant neoplasm of tail of pancreas ([place]) 10/17/2017   Family history of uterine cancer 10/17/2017   Sinusitis, chronic 09/15/2017   Malabsorption due to disorder of pancreas 09/15/2017   Primary malignant neoplasm of endometrium ([place]) 08/31/2017   Secondary malignant neoplasm of right ovary ([place]) 03/47/4259   Pustular folliculitis 56/38/7564   MRSA colonization 05/12/2017   Dacrocystitis, left 05/09/2017   Perennial allergic rhinitis 05/09/2017   Seasonal allergies 03/29/2017   Left foot pain 01/10/2017   Osteoarthritis of both knees 12/01/2016   Class 3 severe obesity due to excess calories without serious comorbidity with body mass index (BMI) of 40.0 to 44.9 in adult ([place]) 10/01/2016   High risk medication use 02/06/2016   Family history of rheumatoid arthritis 02/06/2016   H/O seasonal allergies 02/06/2016   Rheumatoid arthritis of multiple sites with negative rheumatoid factor ([place]) 02/03/2016   History of malignant neoplasm of colon 02/03/2016   HLA B27 (HLA B27 positive) 02/03/2016   Malignant neoplasm of colon, unspecified ([place]) 02/23/1999    Past Surgical History:  Procedure Laterality Date   BREAST LUMPECTOMY WITH RADIOACTIVE SEED LOCALIZATION Left 06/28/2014   Benign Procedure: RADIOACTIVE SEED LOCALIZATION LEFT BREAST LUMPECTOMY;  Surgeon: Excell Seltzer, MD;  Location: [facility];  Service: General;  Laterality: Left;   COLONOSCOPY  06/19/14   ESOPHAGOGASTRODUODENOSCOPY (EGD) WITH PROPOFOL N/A 06/07/2019   Procedure: ESOPHAGOGASTRODUODENOSCOPY (EGD) WITH PROPOFOL;  Surgeon: [name], MD;  Location[facility];  Service: Endoscopy;  Laterality: N/A;   EUS N/A 06/07/2019   Procedure: UPPER ENDOSCOPIC  ULTRASOUND (EUS) LINEAR;  Surgeon: [name], MD;  Location: [facility];  Service: Endoscopy;  Laterality: N/A;   EYE SURGERY Left    plug in tear duct   FINE NEEDLE ASPIRATION N/A 06/07/2019   Procedure: FINE NEEDLE ASPIRATION (FNA) LINEAR;  Surgeon: [name], MD;  [facility];  Service: Endoscopy;  Laterality: N/A;   IR IMAGING GUIDED PORT INSERTION  09/26/2017   KNEE ARTHROSCOPY     PANCREATECTOMY  11/25/2017   PARTIAL KNEE ARTHROPLASTY Left 11/27/2018   Procedure: UNICOMPARTMENTAL KNEE;  Surgeon: Vickey Huger, MD;  Location: [facility];  Service: Orthopedics;  Laterality: Left;   PORT A CATH REVISION  2001   in and out   RIGHT COLECTOMY  06/02/2009   cecum cancer   ROBOTIC ASSISTED TOTAL HYSTERECTOMY WITH BILATERAL SALPINGO OOPHERECTOMY Bilateral 08/30/2017   Procedure: XI ROBOTIC ASSISTED TOTAL  HYSTERECTOMY BILATERAL  SALPINGO OOPHORECTOMY; LYSIS OF ADHESIONS;  Surgeon: Isabel Caprice, MD;  Location: [facility];  Service: Gynecology;  Laterality: Bilateral;   SPLENECTOMY, PARTIAL  11/25/2017     OB History   No obstetric history on file.     Family History  Problem Relation Age of Onset   Arthritis/Rheumatoid Mother    Uterine cancer Mother 74   Allergic rhinitis Father    Heart disease Father    Drug abuse Son  Other Maternal Aunt        Unknown GYN CA   Colon cancer Maternal Uncle 68   Rectal cancer Maternal Uncle    Diabetes Maternal Grandmother    Cancer Maternal Grandfather        d. 39s of liver/lung cancer   Angioedema Neg Hx    Asthma Neg Hx    Atopy Neg Hx    Eczema Neg Hx    Immunodeficiency Neg Hx    Urticaria Neg Hx    Esophageal cancer Neg Hx    Stomach cancer Neg Hx     Social History   Tobacco Use   Smoking status: Former    Packs/day: 1.00    Years: 5.00    Pack years: 5.00    Types: Cigarettes    Quit date: 02/05/1998    Years since quitting: 22.6   Smokeless tobacco: Never  Vaping Use   Vaping Use: Never used   Substance Use Topics   Alcohol use: Not Currently   Drug use: No    Home Medications Prior to Admission medications   Medication Sig Start Date End Date Taking? Authorizing Provider  cetirizine (ZYRTEC) 10 MG tablet Take 10 mg by mouth daily. Patient not taking: Reported on 10/04/2020    [provider]  citalopram (CELEXA) 20 MG tablet TAKE 1 TABLET BY MOUTH EVERY DAY Patient taking differently: Take 20 mg by mouth daily. 08/19/20   Heath Lark, MD  diclofenac Sodium (VOLTAREN) 1 % GEL APPLY 2-4 GRAMS TO AFFECTED JOINT 4 TIMES DAILY AS NEEDED. Patient taking differently: Apply 2 g topically 4 (four) times daily. Apply 2-4 grams to affected joint 4 times daily as needed. 09/03/19   Bo Merino, MD  diphenhydrAMINE (BENADRYL) 25 MG tablet Take 25 mg by mouth every 6 (six) hours as needed for itching.    [provider]  enoxaparin (LOVENOX) 100 MG/ML injection Inject 0.9 mLs (90 mg total) into the skin every 12 (twelve) hours. 10/04/20 11/03/20  Arnaldo Natal, MD  estradiol (ESTRACE VAGINAL) 0.1 MG/GM vaginal cream Place 1 Applicatorful vaginally 3 (three) times a week. Patient taking differently: Place 1 Applicatorful vaginally 3 (three) times a week. Monday Wednesday friday 02/27/20   Heath Lark, MD  fluticasone (FLONASE) 50 MCG/ACT nasal spray Place 1 spray into both nostrils daily.    [provider]  HYDROmorphone (DILAUDID) 8 MG tablet Take 1 tablet (8 mg total) by mouth every 4 (four) hours as needed. 09/17/20   Heath Lark, MD  hydrOXYzine (ATARAX/VISTARIL) 10 MG tablet Take 10 mg by mouth in the morning and at bedtime.    [provider]  lidocaine-prilocaine (EMLA) cream Apply 1 application topically daily as needed (port). Apply to affected area once 12/07/18   Heath Lark, MD  metFORMIN (GLUCOPHAGE) 500 MG tablet Take 2 tablets (1,000 mg total) by mouth 2 (two) times daily with a meal. 07/08/20   Heath Lark, MD  methadone (DOLOPHINE) 10 MG  tablet Take 1 tablet (10 mg total) by mouth every 12 (twelve) hours. 09/09/20   Heath Lark, MD  Multiple Vitamin (MULTIVITAMIN WITH MINERALS) TABS tablet Take 1 tablet by mouth daily.    [provider]  omeprazole (PRILOSEC) 40 MG capsule TAKE 1 CAPSULE BY MOUTH EVERY DAY Patient taking differently: Take 40 mg by mouth daily. 03/07/20   Heath Lark, MD  ondansetron (ZOFRAN) 8 MG tablet TAKE 1 TAB BY MOUTH EVERY 8HOURS AS NEEDED FOR REFRACTORY NAUSEA/VOMITING START ON DAY 3AFTER CHEMO  Patient taking differently: Take 8 mg by mouth every 8 (eight) hours as needed for nausea. 09/08/20   Heath Lark, MD  prochlorperazine (COMPAZINE) 10 MG tablet TAKE 1 TABLET BY MOUTH EVERY 6 HOURS AS NEEDED Patient taking differently: Take 10 mg by mouth every 6 (six) hours as needed. 01/15/20   Heath Lark, MD  simethicone (MYLICON) 737 MG chewable tablet Chew 125 mg by mouth every 6 (six) hours as needed for flatulence.    [provider]    Allergies    Codeine, Other, Penicillins, and Bactrim [sulfamethoxazole-trimethoprim]  Review of Systems   Review of Systems  Respiratory:  Positive for shortness of breath (at baseline). Negative for cough.   Cardiovascular:  Negative for chest pain, palpitations and leg swelling.  Musculoskeletal:  Positive for myalgias.  Skin:  Negative for color change, pallor, rash and wound.  Allergic/Immunologic: Positive for immunocompromised state.  Neurological:  Negative for syncope, weakness, light-headedness and numbness.  Psychiatric/Behavioral:  Negative for confusion.    Physical Exam Updated Vital Signs BP (!) 144/102   Pulse (!) 108   Temp 97.8 F (36.6 C) (Oral)   Resp 18   LMP 08/30/2017   SpO2 95%   Physical Exam Vitals and nursing note reviewed. Exam conducted with a chaperone present (Female nurse tech was present as chaperone).  Constitutional:      General: She is not in acute distress.    Appearance: She is ill-appearing. She is not  toxic-appearing or diaphoretic.     Comments: Chronically ill appearing  HENT:     Head: Normocephalic.  Eyes:     General: No scleral icterus.       Right eye: No discharge.        Left eye: No discharge.     Comments: Bilateral scleral icterus  Cardiovascular:     Rate and Rhythm: Tachycardia present.     Pulses:          Femoral pulses are 2+ on the right side.      Dorsalis pedis pulses are 2+ on the right side and 2+ on the left side.     Comments: Tachycardia at rate of 108 Pulmonary:     Effort: Pulmonary effort is normal. No tachypnea or bradypnea.     Breath sounds: Normal breath sounds. No stridor.  Musculoskeletal:     Right hip: No deformity, lacerations, tenderness or bony tenderness. Normal range of motion.     Right upper leg: Tenderness present. No swelling, edema, deformity, lacerations or bony tenderness.     Right knee: No swelling, deformity, effusion, erythema, ecchymosis, lacerations or bony tenderness. No tenderness.     Right lower leg: Normal.     Right ankle: No swelling, deformity, ecchymosis or lacerations. No tenderness.     Right foot: Normal range of motion and normal capillary refill. No swelling, deformity, laceration, tenderness or bony tenderness. Normal pulse.     Left foot: Normal range of motion and normal capillary refill. No swelling, deformity, laceration, tenderness or bony tenderness. Normal pulse.  Skin:    General: Skin is warm and dry.     Coloration: Skin is jaundiced.  Neurological:     General: No focal deficit present.     Mental Status: She is alert.  Psychiatric:        Behavior: Behavior is cooperative.    ED Results / Procedures / Treatments   Labs (all labs ordered are listed, but only abnormal results are displayed) Labs Reviewed  COMPREHENSIVE METABOLIC PANEL - Abnormal; Notable for the following components:      Result Value   Sodium 134 (*)    Glucose, Bld 101 (*)    Creatinine, Ser 0.40 (*)    Calcium 8.3 (*)     Total Protein 5.5 (*)    Albumin 2.0 (*)    AST 154 (*)    ALT 86 (*)    Alkaline Phosphatase 459 (*)    Total Bilirubin 15.7 (*)    All other components within normal limits  CBC WITH DIFFERENTIAL/PLATELET - Abnormal; Notable for the following components:   WBC 19.2 (*)    RBC 3.37 (*)    Hemoglobin 11.1 (*)    HCT 34.3 (*)    MCV 101.8 (*)    RDW 16.7 (*)    Neutro Abs 13.8 (*)    Monocytes Absolute 2.1 (*)    Abs Immature Granulocytes 0.27 (*)    All other components within normal limits    EKG None  Radiology CT Angio Chest PE W and/or Wo Contrast  Result Date: 10/11/2020 CLINICAL DATA:  Short of breath.  DVT in the legs. Pancreatic cancer currentlyColon cancerOvarian/uterine caHysterectomyColon surgery right colectomyDistal pancreas surgery, splenectomy, cholecystectomyStopped chemo 2 months ago EXAM: CT ANGIOGRAPHY CHEST WITH CONTRAST TECHNIQUE: Multidetector CT imaging of the chest was performed using the standard protocol during bolus administration of intravenous contrast. Multiplanar CT image reconstructions and MIPs were obtained to evaluate the vascular anatomy. CONTRAST:  21m OMNIPAQUE IOHEXOL 350 MG/ML SOLN COMPARISON:  CT, 06/03/2020. FINDINGS: Cardiovascular: There are segmental pulmonary emboli in the right lower lobe clearly seen involving the superior segment and posterior basal segment. There is a strand like area of pulmonary embolus in the right lower lobe pulmonary artery above the segmental branch origins and below the right middle lobe artery origins. No other definite pulmonary emboli. Heart is normal in size and configuration. No pericardial effusion. Aorta is normal in caliber. No dissection or atherosclerosis. No evidence of right heart strain. Mediastinum/Nodes: No enlarged mediastinal, hilar, or axillary lymph nodes. Thyroid gland, trachea, and esophagus demonstrate no significant findings. Lungs/Pleura: Trace left pleural effusion. Minor linear atelectasis  at the lung bases, most evident in the right middle lobe. Lungs otherwise clear. No pneumothorax. Upper Abdomen: Trace amount of ascites. Heterogeneous attenuation of the liver consistent with heterogeneous fatty infiltration. Musculoskeletal: No fracture or acute finding.  No bone lesion. Review of the MIP images confirms the above findings. IMPRESSION: 1. Small segmental pulmonary emboli to the right lower lobe superior segment and posterior basilar segment. Additional strand-like small pulmonary embolus in the right lower lobe pulmonary artery. 2. Mild linear atelectasis at the lung bases. Lungs otherwise clear. 3. Trace left pleural effusion. Electronically Signed   By: DLajean ManesM.D.   On: 10/11/2020 13:52   VAS [place]OWER EXTREMITY VENOUS (DVT) (ONLY [facility])  Result Date: 10/11/2020  Lower Venous DVT Study Patient Name:  CASHIKA APUZZO Date of Exam:   10/11/2020 Medical Rec #: [id number]        Accession #:    [id number] of Birth: 104-04-1975         Patient Gender: F Patient Age:   89years [facility]      VAS [place]OWER EXTREMITY VENOUS (DVT) Referring Phys: PDebbe Mounts--------------------------------------------------------------------------------  Indications: Pain of inner groin area RLE - previously DX'd with DVT 10/04/20.  Risk Factors: Cancer Endometrial CA w/  mets (ovary & pancreatic). Anticoagulation: Lovenox. Comparison Study: Previous exam 10/04/20 - positive for gastroc thrombis RLE Performing Technologist: Jody Hill RVT, RDMS  Examination Guidelines: A complete evaluation includes B-mode imaging, spectral Doppler, color Doppler, and power Doppler as needed of all accessible portions of each vessel. Bilateral testing is considered an integral part of a complete examination. Limited examinations for reoccurring indications may be performed as noted. The reflux portion of the exam is performed with the patient in reverse Trendelenburg.   +---------+---------------+---------+-----------+----------+--------------+ RIGHT    CompressibilityPhasicitySpontaneityPropertiesThrombus Aging +---------+---------------+---------+-----------+----------+--------------+ CFV      Full           Yes      Yes                                 +---------+---------------+---------+-----------+----------+--------------+ SFJ      Full                                                        +---------+---------------+---------+-----------+----------+--------------+ FV Prox  Full           Yes      Yes                                 +---------+---------------+---------+-----------+----------+--------------+ FV Mid   Full           Yes      Yes                                 +---------+---------------+---------+-----------+----------+--------------+ FV DistalFull           Yes      Yes                                 +---------+---------------+---------+-----------+----------+--------------+ PFV      Full                                                        +---------+---------------+---------+-----------+----------+--------------+ POP      Full           Yes      Yes                                 +---------+---------------+---------+-----------+----------+--------------+ PTV      Full                                                        +---------+---------------+---------+-----------+----------+--------------+ PERO     Full                                                        +---------+---------------+---------+-----------+----------+--------------+  Gastroc  None           No       No                   Chronic        +---------+---------------+---------+-----------+----------+--------------+   +----+---------------+---------+-----------+----------+--------------+ LEFTCompressibilityPhasicitySpontaneityPropertiesThrombus Aging  +----+---------------+---------+-----------+----------+--------------+ CFV Full           Yes      Yes                                 +----+---------------+---------+-----------+----------+--------------+    Summary: RIGHT: - Findings consistent with chronic deep vein thrombosis involving the right gastrocnemius veins. - There is no evidence of superficial venous thrombosis.  - A cystic structure is found in the popliteal fossa.  LEFT: - No evidence of common femoral vein obstruction.  *See table(s) above for measurements and observations. Electronically signed by Deitra Mayo MD on 10/11/2020 at 3:57:41 PM.    Final     Procedures Procedures   Medications Ordered in ED Medications - No data to display  ED Course  I have reviewed the triage vital signs and the nursing notes.  Pertinent labs & imaging results that were available during my care of the patient were reviewed by me and considered in my medical decision making (see chart for details).    MDM Rules/Calculators/A&P                           Alert 45 year old female in no acute distress, nontoxic-appearing, chronically ill-appearing.  Presents to the emergency department with a chief complaint of right groin pain.  Patient was diagnosed with right gastrocnemius DVT on 8/13.  Patient reports that pain has migrated from right calf to right groin.  Has been taking Lovenox as prescribed.  +2 right DP and right groin pulse.  Tenderness to right upper thigh near groin.  No swelling, erythema, color change, or rash noted to right lower extremity.  Will obtain right DVT study to evaluate for clot moving to right groin.  If blood clot is not proximal we will reach out to vascular team.  Patient endorses shortness of breath at baseline.  Denies any change of shortness of breath.  Patient denies any hemoptysis or chest pain.  Patient noted to have persistent tachycardia.  Due to patient's increased thrombosis due to malignancy will  obtain CTA chest to evaluate for PE.  CMP obtained shows labs are stable with those previously obtained 3 weeks prior.  CBC shows increased leukocytosis at 19.2 remainder of labs consistent with those obtained 3 weeks prior.  Low suspicion for infection at this time as patient is afebrile, suspect increased due to patient's VTE.    CTA chest shows: -Small segmental pulmonary emboli to the right lower lobe superior segment and posterior basal segment.  Additional strand-like small pulmonary embolus in the right lower lobe pulmonary artery. -Trace left pleural effusion -Mild linear atelectasis at the lung bases, lungs otherwise clear.  Due to patient's hospice status she would not be a candidate for admission and inpatient care.  Patient is currently on Lovenox for DVT of right lower extremity.  We will have patient continue Lovenox treatment.  Ultrasound study shows findings consistent with chronic DVT involving right gastrocnemius veins.  No evidence of superficial venous thrombosis.  Cystic structure found in the popliteal fossa.  No DVT to explain patient's  right groin pain.  No signs of cellulitis.  Suspect patient's right groin injury may be musculoskeletal in nature.  Patient advised to continue current pain management regimen.  Patient advised to have close follow-up with hematologist oncologist Dr. Alvy Bimler continued management of Lovenox in light of DVT and PE.  Discussed results, findings, treatment and follow up. Patient advised of return precautions. Patient verbalized understanding and agreed with plan.  Patient care and treatment were discussed with attending physician Dr.Lawsing.  Final Clinical Impression(s) / ED Diagnoses Final diagnoses:  Deep vein thrombosis (DVT) of calf muscle vein of right lower extremity, unspecified chronicity ([place])  Pulmonary embolism, other, unspecified chronicity, unspecified whether acute cor pulmonale present ([place])  Right groin pain    Rx / DC  Orders ED Discharge Orders     None        Dyann Ruddle 10/11/20 2236    Regan Lemming, MD 10/12/20 1259

## 2020-10-12 DIAGNOSIS — C252 Malignant neoplasm of tail of pancreas: Secondary | ICD-10-CM | POA: Diagnosis not present

## 2020-10-12 DIAGNOSIS — R630 Anorexia: Secondary | ICD-10-CM | POA: Diagnosis not present

## 2020-10-12 DIAGNOSIS — E8809 Other disorders of plasma-protein metabolism, not elsewhere classified: Secondary | ICD-10-CM | POA: Diagnosis not present

## 2020-10-12 DIAGNOSIS — C189 Malignant neoplasm of colon, unspecified: Secondary | ICD-10-CM | POA: Diagnosis not present

## 2020-10-12 DIAGNOSIS — C569 Malignant neoplasm of unspecified ovary: Secondary | ICD-10-CM | POA: Diagnosis not present

## 2020-10-12 DIAGNOSIS — R110 Nausea: Secondary | ICD-10-CM | POA: Diagnosis not present

## 2020-10-12 DIAGNOSIS — C787 Secondary malignant neoplasm of liver and intrahepatic bile duct: Secondary | ICD-10-CM | POA: Diagnosis not present

## 2020-10-12 DIAGNOSIS — C541 Malignant neoplasm of endometrium: Secondary | ICD-10-CM | POA: Diagnosis not present

## 2020-10-12 DIAGNOSIS — G620 Drug-induced polyneuropathy: Secondary | ICD-10-CM | POA: Diagnosis not present

## 2020-10-12 DIAGNOSIS — Z87442 Personal history of urinary calculi: Secondary | ICD-10-CM | POA: Diagnosis not present

## 2020-10-12 DIAGNOSIS — Z1509 Genetic susceptibility to other malignant neoplasm: Secondary | ICD-10-CM | POA: Diagnosis not present

## 2020-10-12 DIAGNOSIS — R17 Unspecified jaundice: Secondary | ICD-10-CM | POA: Diagnosis not present

## 2020-10-12 DIAGNOSIS — Z8619 Personal history of other infectious and parasitic diseases: Secondary | ICD-10-CM | POA: Diagnosis not present

## 2020-10-12 DIAGNOSIS — R634 Abnormal weight loss: Secondary | ICD-10-CM | POA: Diagnosis not present

## 2020-10-12 DIAGNOSIS — Z9221 Personal history of antineoplastic chemotherapy: Secondary | ICD-10-CM | POA: Diagnosis not present

## 2020-10-13 ENCOUNTER — Telehealth: Payer: Self-pay

## 2020-10-13 ENCOUNTER — Encounter: Payer: Self-pay | Admitting: Hematology and Oncology

## 2020-10-13 ENCOUNTER — Telehealth (HOSPITAL_BASED_OUTPATIENT_CLINIC_OR_DEPARTMENT_OTHER): Payer: BC Managed Care – PPO | Admitting: Hematology and Oncology

## 2020-10-13 DIAGNOSIS — I2699 Other pulmonary embolism without acute cor pulmonale: Secondary | ICD-10-CM | POA: Diagnosis not present

## 2020-10-13 DIAGNOSIS — R630 Anorexia: Secondary | ICD-10-CM | POA: Diagnosis not present

## 2020-10-13 DIAGNOSIS — I82461 Acute embolism and thrombosis of right calf muscular vein: Secondary | ICD-10-CM

## 2020-10-13 DIAGNOSIS — E8809 Other disorders of plasma-protein metabolism, not elsewhere classified: Secondary | ICD-10-CM | POA: Diagnosis not present

## 2020-10-13 DIAGNOSIS — Z87442 Personal history of urinary calculi: Secondary | ICD-10-CM | POA: Diagnosis not present

## 2020-10-13 DIAGNOSIS — C252 Malignant neoplasm of tail of pancreas: Secondary | ICD-10-CM | POA: Diagnosis not present

## 2020-10-13 DIAGNOSIS — C569 Malignant neoplasm of unspecified ovary: Secondary | ICD-10-CM | POA: Diagnosis not present

## 2020-10-13 DIAGNOSIS — Z1509 Genetic susceptibility to other malignant neoplasm: Secondary | ICD-10-CM | POA: Diagnosis not present

## 2020-10-13 DIAGNOSIS — K7200 Acute and subacute hepatic failure without coma: Secondary | ICD-10-CM | POA: Diagnosis not present

## 2020-10-13 DIAGNOSIS — C787 Secondary malignant neoplasm of liver and intrahepatic bile duct: Secondary | ICD-10-CM | POA: Diagnosis not present

## 2020-10-13 DIAGNOSIS — R110 Nausea: Secondary | ICD-10-CM | POA: Diagnosis not present

## 2020-10-13 DIAGNOSIS — R17 Unspecified jaundice: Secondary | ICD-10-CM | POA: Diagnosis not present

## 2020-10-13 DIAGNOSIS — Z9221 Personal history of antineoplastic chemotherapy: Secondary | ICD-10-CM | POA: Diagnosis not present

## 2020-10-13 DIAGNOSIS — G620 Drug-induced polyneuropathy: Secondary | ICD-10-CM | POA: Diagnosis not present

## 2020-10-13 DIAGNOSIS — C541 Malignant neoplasm of endometrium: Secondary | ICD-10-CM | POA: Diagnosis not present

## 2020-10-13 DIAGNOSIS — Z8619 Personal history of other infectious and parasitic diseases: Secondary | ICD-10-CM | POA: Diagnosis not present

## 2020-10-13 DIAGNOSIS — C189 Malignant neoplasm of colon, unspecified: Secondary | ICD-10-CM | POA: Diagnosis not present

## 2020-10-13 DIAGNOSIS — R634 Abnormal weight loss: Secondary | ICD-10-CM | POA: Diagnosis not present

## 2020-10-13 MED ORDER — METHADONE HCL 10 MG PO TABS: 10.0000 mg | ORAL_TABLET | Freq: Two times a day (BID) | ORAL | 0 refills | Status: AC

## 2020-10-13 NOTE — Assessment & Plan Note (Signed)
I have reviewed her recent liver enzymes It is high but not unexpected Observe closely

## 2020-10-13 NOTE — Telephone Encounter (Signed)
-----   Message from Heath Lark, MD sent at 10/13/2020  7:35 AM EDT ----- She was in the ER Can you schedule virtual visit today at 1220pm?

## 2020-10-13 NOTE — Assessment & Plan Note (Signed)
I am not surprised with CT angiogram findings of lung PE She will continue Lovenox indefinitely

## 2020-10-13 NOTE — Telephone Encounter (Signed)
Called and given below message. Virtual visit scheduled at 1220, she is aware of appt time.

## 2020-10-13 NOTE — Progress Notes (Signed)
HEMATOLOGY-ONCOLOGY ELECTRONIC VISIT PROGRESS NOTE  Patient Care Team: Heath Lark, MD as PCP - General (Hematology and Oncology)  I connected with by [facility] video conference and verified that I am speaking with the correct person using two identifiers.  I discussed the limitations, risks, security and privacy concerns of performing an evaluation and management service by EPIC and the availability of in person appointments.  I also discussed with the patient that there may be a patient responsible charge related to this service. The patient expressed understanding and agreed to proceed.   ASSESSMENT & PLAN:  Malignant neoplasm of tail of pancreas ([place]) She is currently on palliative care with hospice Continue supportive care  Right leg DVT ([place]) Her recent groin pain is stable now She will continue chronic pain management She will continue Lovenox indefinitely We discussed the risk and benefits of adding aspirin therapy in the future if needed So far, she is doing well without bleeding  Liver failure without hepatic coma ([place]) I have reviewed her recent liver enzymes It is high but not unexpected Observe closely  Pulmonary embolism ([place]) I am not surprised with CT angiogram findings of lung PE She will continue Lovenox indefinitely  No orders of the defined types were placed in this encounter.   INTERVAL HISTORY: Please see below for problem oriented charting. 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 Mixed attenuation mass emanates from the right adnexa measuring 10.8 x 8.0 cm very suspicious for right ovarian carcinoma. 2. Abnormality of the tail of the pancreas may be due to mild pancreatitis and small pseudocyst formation, but neoplasm cannot be excluded. 3. Small amount of ascites within abdomen and pelvis. 4. Small nonobstructing renal calculi bilaterally.     08/30/2017 Pathology Results   1. Uterus and cervix, with left fallopian tube - ENDOMETRIOID ADENOCARCINOMA, FIGO GRADE I, ARISING IN A BACKGROUND OF DIFFUSE COMPLEX ATYPICAL HYPERPLASIA. - CARCINOMA INVADES FOR OF DEPTH OF 0.2 CM WHERE THICKNESS OF MYOMETRIAL WALL IS 2.1 CM. - ALL RESECTION MARGINS ARE NEGATIVE FOR CARCINOMA. - NEGATIVE FOR LYMPHOVASCULAR OR PERINEURAL INVASION. - CERVICAL STROMA IS NOT INVOLVED. - SEE ONCOLOGY TABLE. - SEE NOTE 2. Ovary and fallopian tube, right - PRIMARY OVARIAN ENDOMETRIOID ADENOCARCINOMA, FIGO GRADE II, 12 CM. - THE OVARIAN SURFACE IS FOCALLY INVOLVED BY CARCINOMA. - NEGATIVE FOR LYMPHOVASCULAR INVASION. - BENIGN UNREMARKABLE FALLOPIAN TUBE, NEGATIVE FOR CARCINOMA. - SEE ONCOLOGY TABLE. - SEE NOTE 3. Cul-de-sac biopsy - METASTATIC ADENOCARCINOMA, MOST CONSISTENT WITH PRIMARY OVARIAN ENDOMETRIOID ADENOCARCINOMA. 4. Ovary, left - BENIGN UNREMARKABLE OVARY, NEGATIVE FOR MALIGNANCY. Microscopic Comment 1. UTERUS, CARCINOMA OR CARCINOSARCOMA Procedure: Total hysterectomy with bilateral salpingo-oophorectomy. Histologic type: Endometrioid adenocarcinoma. Histologic Grade: FIGO Grade I Myometrial invasion: Depth of invasion: 2 mm Myometrial thickness: 21 mm Uterine Serosa Involvement: Not identified Cervical stromal involvement: Not identified Extent of involvement of other organs: Not applicable Lymphovascular invasion: Not  identified Regional Lymph Nodes: Examined: 0 Sentinel 0 Non-sentinel 0  Total Tumor block for ancillary studies: 1H MMR / MSI testing: Pending Pathologic Stage Classification (pTNM, AJCC 8th edition): pT1a, pNX (v4.1.0.0) 2. OVARY or FALLOPIAN TUBE or PRIMARY PERITONEUM: Procedure: Salpingo-oophorectomy Specimen Integrity: Intact Tumor Site: Right ovary Ovarian Surface Involvement (required only if applicable): Focally involved by carcinoma Fallopian Tube Surface Involvement (required only if applicable): Not identified Tumor Size: 12 cm Histologic Type: Endometrioid adenocarcinoma Histologic Grade: Grade II Implants (required for advanced stage serous/seromucinous borderline tumors only): Not applicable Other Tissue/ Organ Involvement: Cul de sac biopsy involved by tumor Largest Extrapelvic Peritoneal Focus (required only if applicable): Not applicable Peritoneal/Ascitic Fluid: Negative for carcinoma (case # [id number]) Treatment Effect (required only for high-grade serous carcinomas): Not applicable Regional Lymph Nodes: No lymph nodes submitted or found Number of Lymph Nodes Examined: 0 Pathologic Stage Classification (pTNM, AJCC 8th Edition): pT2b, pN0 Representative Tumor Block: 2B and 2E 1. Molecular study for microsatellite instability and immunohistochemical stains for MMR-related proteins are pending and will be reported in an addendum. 2. Immunohistochemical stain show that the ovarian tumor is positive for CK7 and PAX8 (both diffuse), CDX2 (patchy and weak); and negative for CK20. This immunoprofile is consistent with the above diagnosis. Dr. Lyndon Code has reviewed this case and concurs with the above diagnosis. Molecular study for microsatellite instability and immunohistochemical stains for MMR-related proteins are pending and will be reported in an addendum   08/30/2017 Genetic Testing   Patient has genetic testing done for MSI  Results revealed patient has the following  mutation(s): loss of [facility] 2   08/30/2017 Surgery   Surgeon: Mart Piggs, MD Pre-operative Diagnosis:  Adnexal mass Abnormal uterine bleeding H/o Cecal CA   Post-operative Diagnosis:  Adhesive disease post colon resection Endometrial cancer NOS Adenocarcinoma unknown origin, right ovary, suspicious for GI primary   Operation:  Lysis of adhesions ~30 minutes Robotic-assisted laparoscopic total hysterectomy with right salpingo-oophorectomy and left salpingectomy Left oophorectomy (RA-laparoscopic) Pelvic washings    Findings: Adhesions of omentum to anterior abdominal wall. Enlarged cystic right ovary ~10cm. Uterus had small nodules on serosa near where right adnexa was intimate with the surface. No obvious intraoperative rupture of cyst, although in 2 areas the wall was thin and one of these areas had some bleeding. Slight scarring of left bladder dome to LUS/cervix. Uterus on frozen section c/w hyperplasia and a small focus of endometrial CA - no myo invasion, <2cm in size. Frozen section on the right adnexa was carcinoma, met from colon or possibly Gyn, favor GI primary, defer to permanent. Left ovary was WNL.     09/15/2017 Cancer Staging   Staging form: Corpus Uteri - Carcinoma and Carcinosarcoma, AJCC 8th Edition - Pathologic: FIGO Stage IA (pT1a, pN0, cM0) - Signed by Heath Lark, MD on 09/15/2017   09/26/2017 Procedure   Successful placement of a right internal jugular approach power injectable Port-A-Cath. The catheter is ready for immediate use.   11/07/2017 Imaging   1. Since 08/18/2017, similar to slight decrease in size of a pancreatic body/tail junction lesion. Cross modality comparison relative to 09/16/2017 MRI is also grossly similar. 2. No evidence of metastatic disease. 3.  Aortic Atherosclerosis (ICD10-I70.0).  4. Left nephrolithiasis.   11/18/2017 Genetic Testing   MSH2 c.2237dupT pathogenic mutation identified in the CancerNext panel.  The CancerNext gene  panel offered by [employer] includes sequencing and rearrangement analysis for the following 34 genes:   APC, ATM, BARD1, BMPR1A, BRCA1, BRCA2, BRIP1, CDH1, CDK4, CDKN2A, CHEK2, DICER1, HOXB13, EPCAM, GREM1, MLH1, MRE11A,  MSH2, MSH6, MUTYH, NBN, NF1, PALB2, PMS2, POLD1, POLE, PTEN, RAD50, RAD51C, RAD51D, SMAD4, SMARCA4, STK11, and TP53.  The report date is November 18, 2017.  MSH2 [id number] pathogenic mutation identified on somatic testing.  These results are consistent with a diagnosis of Lynch syndrome.   06/18/2019 - 08/14/2019 Chemotherapy   The patient had FOLFIRINOX for chemotherapy treatment.     01/19/2020 - 08/19/2020 Chemotherapy          04/11/2020 Imaging   1. Essentially stable appearance of the soft tissue density process along the distal pancreatectomy site, abutting the posterior portion of the stomach, celiac trunk, left lateral margin of the SMA, and potentially the superior margin of the left renal vein. This could be inflammatory but residual tumor not excluded. 2. Some of the liver lesions are mildly reduced in size and some of the liver lesions are stable in size. 3. Further reduction in omental nodularity. 4. Small paraumbilical hernia contains a loop of small bowel, without findings of strangulation or obstruction. 5. Small supraumbilical hernias containing adipose tissue. 6. Small nonobstructive left renal calculus.   09/18/2020 -  Chemotherapy    Patient is on Treatment Plan: PANCREATIC MSI-H/DMMR PEMBROLIZUMAB Q21D       Secondary malignant neoplasm of right ovary ([place])  08/23/2017 Tumor Marker   Patient's tumor was tested for the following markers: CA-125 Results of the tumor marker test revealed 139.8   08/31/2017 Initial Diagnosis   Secondary malignant neoplasm of right ovary ([place])   09/15/2017 Cancer Staging   Staging form: Ovary, Fallopian Tube, and Primary Peritoneal Carcinoma, AJCC 8th Edition - Pathologic: Stage IIB (pT2b, pN0, cM0) -  Signed by Heath Lark, MD on 09/15/2017   09/27/2017 Imaging   No evidence of metastatic disease or other acute findings within the thorax.   4 cm low-attenuation mass in pancreatic tail, highly suspicious for pancreatic carcinoma. This is caused splenic vein thrombosis, with new venous collaterals in the left upper quadrant. Consider endoscopic ultrasound with FNA for tissue diagnosis.   Stable benign hepatic hemangioma.     09/27/2017 Tumor Marker   Patient's tumor was tested for the following markers: CA-125 Results of the tumor marker test revealed 39.4   11/02/2017 Tumor Marker   Patient's tumor was tested for the following markers: CA-125 Results of the tumor marker test revealed 19   11/07/2017 Tumor Marker   Patient's tumor was tested for the following markers: CA-125 Results of the tumor marker test revealed 18.3   11/18/2017 Genetic Testing   MSH2 c.2237dupT pathogenic mutation identified in the CancerNext panel.  The CancerNext gene panel offered by [employer] includes sequencing and rearrangement analysis for the following 34 genes:   APC, ATM, BARD1, BMPR1A, BRCA1, BRCA2, BRIP1, CDH1, CDK4, CDKN2A, CHEK2, DICER1, HOXB13, EPCAM, GREM1, MLH1, MRE11A, MSH2, MSH6, MUTYH, NBN, NF1, PALB2, PMS2, POLD1, POLE, PTEN, RAD50, RAD51C, RAD51D, SMAD4, SMARCA4, STK11, and TP53.  The report date is November 18, 2017.  MSH2 [id number] pathogenic mutation identified on somatic testing.  These results are consistent with a diagnosis of Lynch syndrome.   12/15/2017 Tumor Marker   Patient's tumor was tested for the following markers: CA-125 Results of the tumor marker test revealed 57.3   01/16/2018 Tumor Marker   Patient's tumor was tested for the following markers: CA-125 Results of the tumor marker test revealed 24.2   04/10/2018 Tumor Marker   Patient's tumor was tested for the following markers: CA-125 Results of the tumor marker test revealed 18.2   07/12/2018  Tumor Marker    Patient's tumor was tested for the following markers: CA-125 Results of the tumor marker test revealed 11.8   10/16/2018 Tumor Marker   Patient's tumor was tested for the following markers: CA125 Results of the tumor marker test revealed 12.4.   01/18/2020 Tumor Marker   Patient's tumor was tested for the following markers: CA-125 Results of the tumor marker test revealed 18   Malignant neoplasm of tail of pancreas ([place])  10/13/2017 Pathology Results   Pancreas tail mass, endoscopic ultrasound-guided, fine needle aspiration II (smears and cell block):      Adenocarcinoma   11/18/2017 Genetic Testing   MSH2 c.2237dupT pathogenic mutation identified in the CancerNext panel.  The CancerNext gene panel offered by [employer] includes sequencing and rearrangement analysis for the following 34 genes:   APC, ATM, BARD1, BMPR1A, BRCA1, BRCA2, BRIP1, CDH1, CDK4, CDKN2A, CHEK2, DICER1, HOXB13, EPCAM, GREM1, MLH1, MRE11A, MSH2, MSH6, MUTYH, NBN, NF1, PALB2, PMS2, POLD1, POLE, PTEN, RAD50, RAD51C, RAD51D, SMAD4, SMARCA4, STK11, and TP53.  The report date is November 18, 2017.  MSH2 [id number] pathogenic mutation identified on somatic testing.  These results are consistent with a diagnosis of Lynch syndrome.   11/24/2017 Pathology Results   A.  "TAIL OF PANCREAS AND SPLEEN", DISTAL PANCREATECTOMY AND SPLENECTOMY:      Invasive ductal adenocarcinoma, moderately to poorly differentiated with focal signet ring cell features, of pancreas (distal).         The carcinoma is 2.5 cm in greatest dimension grossly.          Treatment effects present in the form of fibrosis (50%).      No lymphovascular or definite perineural invasion identified.      All surgical margins are negative for tumor or high-grade dysplasia.      Adjacent mucinous neoplasm, most consistent with Intraductal papillary mucinous neoplasm (IPMN) with low-grade dysplasia.         Uninvolved pancreas show atrophy and focal  acute inflammation.          Twelve benign lymph nodes (0/12).      Spleen with no significant histopathologic abnormalities.  PROCEDURE: distal pancreatectomy and splenectomy TUMOR SITE: distal pancreas TUMOR SIZE:   GREATEST DIMENSION: 2.5  cm   ADDITIONAL DIMENSIONS:   x   cm HISTOLOGIC TYPE: ductal adenocarcinoma HISTOLOGIC GRADE: grade 3 TUMOR EXTENSION: peripancreatic soft tissue MARGINS: negative for tumor TREATMENT EFFECT:  treatment effects present in the form of fibrosis (50%). LYMPHOVASCULAR INVASION: not identified PERINEURAL INVASION: no definite evidence REGIONAL LYMPH NODES:     NUMBER OF LYMPH NODES INVOLVED: 0     NUMBER OF LYMPH NODES EXAMINED: 12 PATHOLOGIC STAGE CLASSIFICATION (pTNM, AJCC 8th Ed): ypT2, ypN0 DISTANT METASTASIS (pM):  pMx ADDITIONAL PATHOLOGIC FINDINGS: mucinous neoplasm, most consistent with intraductal papillary mucinous neoplasm (IPMN) with low-grade dysplasia, is identified adjacent to the main tumor.   11/24/2017 Cancer Staging   Staging form: Exocrine Pancreas, AJCC 8th Edition - Pathologic stage from 11/24/2017: Stage IB (pT2, pN0, cM0) - Signed by Truitt Merle, MD on 01/03/2018   11/25/2017 Surgery   She had surgery at [facility] 1. Exploratory Laparotomy 2. Distal Pancreatectomy and Splenectomy 3. Intraoperative Ultrasound 4. Open Cholecystectomy    12/15/2017 Cancer Staging   Staging form: Exocrine Pancreas, AJCC 8th Edition - Clinical: Stage IB (cT2, cN0, cM0) - Signed by Heath Lark, MD on 12/15/2017   12/28/2017 Imaging   12/28/2017 CT Abdomen   IMPRESSION: 1. Postoperative findings from recent partial pancreatectomy  including a 21 cubic cm fluid collection along the pancreatic resection margin which could represent early pseudocyst. 2. Nodularity along the lateral limb of the left adrenal gland could also be postoperative but merit surveillance, as the pancreatic lesion was in close proximity to this adrenal gland on the prior CT  of 11/07/2017. 3. Asymmetric fullness inferiorly in the left breast. The patient has a history of prior left breast procedures, correlation with mammography is recommended. 4. Other imaging findings of potential clinical significance: Stable hemangioma in the left hepatic lobe. Splenectomy. Aortic Atherosclerosis (ICD10-I70.0). Right hemicolectomy. Small focus of fat necrosis in the right anterior abdominal wall subcutaneous tissues near the laparotomy site. Bilateral nonobstructive nephrolithiasis.   01/02/2018 Tumor Marker   Patient's tumor was tested for the following markers: CA-19-9 Results of the tumor marker test revealed 5   01/03/2018 - 06/05/2018 Chemotherapy   She received modified dose FOLFIRINOX   04/10/2018 Imaging   1. Distal pancreatectomy, without findings of recurrent or metastatic disease. 2. Incompletely imaged hypoenhancement within lower lobe right pulmonary artery branch is likely chronic (but interval since 10/09/2017) pulmonary embolism. Dedicated CTA could further evaluate. 3. Decrease in size of a peripancreatic complex fluid collection anteriorly, likely a resolving pseudocyst. 4. Decreased size of minimal fluid versus a borderline sized node in the gastrohepatic ligament. Recommend attention on follow-up. 5. Hepatic steatosis with a segment 4 hemangioma. 6. Aortic Atherosclerosis (ICD10-I70.0). This is significantly age advanced.   07/12/2018 Tumor Marker   Patient's tumor was tested for the following markers: CA-19-9 Results of the tumor marker test revealed 6   07/12/2018 Imaging   1. Status post distal pancreatectomy with stable postoperative fluid collection adjacent to the ventral aspect of the pancreatic head. No findings to suggest metastatic disease in the abdomen or pelvis. 2. Hepatic steatosis with small cavernous hemangioma in segment 4A of the liver. 3. Slight decreased size of nonenlarged gastrohepatic ligament lymph node, presumably benign. 4. Aortic  atherosclerosis.     10/16/2018 Imaging   Status post distal pancreatectomy. Stable postoperative seroma along the surgical margin.   Status post hysterectomy and right oophorectomy.   Status post right hemicolectomy with appendectomy.   No evidence of recurrent or metastatic disease.   No colonic wall thickening or mass is evident on CT.   01/22/2019 Tumor Marker   Patient's tumor was tested for the following markers: CA-19.9 Results of the tumor marker test revealed 5   01/22/2019 Imaging   1. No evidence of local recurrence or metastatic disease status post distal pancreatectomy and splenectomy. 2. Stable small seroma anterior to the pancreatic head. 3. Postsurgical changes as described. 4. Stable additional incidental findings including a hepatic hemangioma, nonobstructing bilateral renal calculi and aortic Atherosclerosis (ICD10-I70.0).   04/18/2019 Imaging   1. Status post distal pancreatectomy and splenectomy. There has been a gradual increase in ill-defined soft tissue and celiac axis/SMA origin lymph nodes adjacent to the distal pancreatectomy margin and lesser curvature of the stomach/gastric body over sequential prior examinations dated 01/22/2019 and 09/26/2018. An enlarged lymph node or soft tissue nodule adjacent to the SMA origin now measures 1.8 x 0.8 cm. Findings are concerning for local recurrence of pancreatic malignancy. 2. Separately from the above findings, there remains a 1.0 cm low-attenuation nodule anterior to the remnant pancreatic neck, most consistent with postoperative seroma 3. No evidence of distant metastatic disease in the chest, abdomen, or pelvis. 4. Postoperative findings of cholecystectomy, right hemicolectomy, and hysterectomy. 5. Bilateral nonobstructive nephrolithiasis. 6.  Aortic Atherosclerosis (  ICD10-I70.0).       05/28/2019 Tumor Marker   Patient's tumor was tested for the following markers: CA19-9 Results of the tumor marker test revealed  18   05/28/2019 Imaging   1. Status post distal pancreatectomy with continued further progression of the ill-defined soft tissue to the left of the celiac axis/SMA origin, now measuring 1.9 x 1.9 cm and highly concerning for recurrent/metastatic disease. This process abuts both the celiac axis, SMA, and posterior wall of the mid stomach. PET-CT may prove helpful to further evaluate. 2. Bandlike ill-defined soft tissue in the anterior abdomen, potentially peritoneal or omental is similar to perhaps minimally increased qualitatively in the interval. Close attention on follow-up recommended. 3. Left paraumbilical hernia contains a short segment of small bowel without complicating features. 4. Ill-defined very subtle area of decreased attenuation in the posterior aspect of hepatic segment IV. This is likely focal fatty deposition. Close attention on follow-up recommended. 5.  Aortic Atherosclerois (ICD10-170.0)   06/07/2019 Pathology Results   Malignant cells consistent with adenocarcinoma   06/07/2019 Procedure   ENDOSCOPIC FINDING (limited views with linear echoendoscope): :      The examined esophagus was endoscopically normal.      The entire examined stomach was endoscopically normal.      ENDOSONOGRAPHIC FINDING: :      1. An irregular mass was identified in the region of the pancreatic tail resection site. The mass was stellate with very poorly defined borders. Fine needle aspiration for cytology was performed. Color Doppler imaging was utilized prior to needle puncture to confirm a lack of significant vascular structures within the needle path. Four passes were made with the 25 gauge (FNB) needle using a transgastric approach. A cytotechnologist was present to evaluate the adequacy of the specimen. Final cytology results are pending. Impression:               -  Irregular, stellate soft tissue mass in the region of the pancreatic tail resection site. This was sampled with four transgastric passes  with an EUS FNB needle..   06/18/2019 - 08/14/2019 Chemotherapy   The patient had FOLFIRINOX for chemotherapy treatment.     09/03/2019 Imaging   1. Findings are again highly concerning for locally recurrent disease adjacent to the pancreatectomy bed, with enlarging soft tissue mass which is intimately associated with the superior mesenteric artery, celiac axis and superior aspect of the left renal  vein, as detailed above. 2. No evidence of metastatic disease in the thorax. 3. 2.7 x 1.2 cm cavernous hemangioma in segment 4A of the liver again noted. 4. Small nonobstructive calculi in the collecting systems of both kidneys measuring up to 3 mm in the lower pole collecting system of the left kidney. 5. Aortic atherosclerosis. 6. Additional incidental findings, as above.   11/16/2019 Imaging   Similar-appearing ill-defined soft tissue the left of the celiac axis/SMA, most compatible with recurrent/metastatic disease.   Similar ill-defined nodularity within the anterior upper abdomen, potentially peritoneal or omental. Continued attention on follow-up is recommended.   12/31/2019 Tumor Marker   Patient's tumor was tested for the following markers: CA-19-9 Results of the tumor marker test revealed 298   01/10/2020 Imaging   1. Numerous small hypoenhancing liver masses scattered throughout the liver, all new since 11/16/2019 CT, compatible with new liver metastases. 2. Stable upper retroperitoneal mass encasing the celiac axis and abutting the pancreatic resection margin, compatible with stable locally recurrent tumor. 3. Stable subcentimeter indistinct anterior upper peritoneal implants. 4.  No evidence of metastatic disease in the chest. 5. Chronic findings include: Nonobstructing left nephrolithiasis. Stable small left paraumbilical hernia containing a small bowel loop without acute bowel complication. Aortic Atherosclerosis (ICD10-I70.0).     01/19/2020 - 08/19/2020 Chemotherapy           01/25/2020 Tumor Marker   Patient's tumor was tested for the following markers: CA-19-9 Results of the tumor marker test revealed 1104.   03/04/2020 Tumor Marker   Patient's tumor was tested for the following markers: CA-19-9 Results of the tumor marker test revealed 581   04/11/2020 Imaging   1. Essentially stable appearance of the soft tissue density process along the distal pancreatectomy site, abutting the posterior portion of the stomach, celiac trunk, left lateral margin of the SMA, and potentially the superior margin of the left renal vein. This could be inflammatory but residual tumor not excluded. 2. Some of the liver lesions are mildly reduced in size and some of the liver lesions are stable in size. 3. Further reduction in omental nodularity. 4. Small paraumbilical hernia contains a loop of small bowel, without findings of strangulation or obstruction. 5. Small supraumbilical hernias containing adipose tissue. 6. Small nonobstructive left renal calculus.   04/11/2020 Tumor Marker   Patient's tumor was tested for the following markers: CA19-9 Results of the tumor marker test revealed 451   05/27/2020 Imaging   1. Stable chest CT, without acute findings or evidence of metastatic disease. 2. Assessment of the patient's liver disease is limited by progressive geographic hepatic steatosis. There is one central hepatic lesion which appears slightly more conspicuous, and this may be contributing to new intrahepatic biliary dilatation posteriorly in the right lobe. The other hepatic lesions are generally improved. No extrahepatic biliary dilatation. 3. 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 No apparent residual peritoneal disease. 4. Stable postsurgical changes and small hernias of the anterior abdominal wall.   05/28/2020 Tumor Marker   Patient's tumor was tested for the following markers: CA19-9 Results of the tumor marker  test revealed 3097   06/24/2020 Tumor Marker   Patient's tumor was tested for the following marker CA19-9. Results of the tumor marker test revealed 4304   07/25/2020 Tumor Marker   Patient's tumor was tested for the following markers: CA-125 Results of the tumor marker test revealed 49.1   07/28/2020 Tumor Marker   Patient's tumor was tested for the following markers: CA-19-9 Results of the tumor marker test revealed 6501   09/01/2020 Imaging   IMPRESSION: 1. Interval progression of several low-density lesions in the liver with new tiny low-density liver lesions evident on today's study. Features are concerning for metastatic disease. PET-CT may prove helpful to further evaluate (see below). The mild posterior right intrahepatic biliary duct dilatation is stable. 2. Stable appearance of the soft tissue attenuation along the distal pancreatectomy resection bed, between the stomach and the celiac axis/SMA. There is some subtle soft tissue attenuation to the left of the SMA, new/more prominent today than on the prior study. Attention on follow-up recommended. PET-CT may prove helpful to assess this region. 3. Stable appearance of the cavernous hemangioma in the anterior right liver. 4. Tiny nonobstructing left renal stone.   09/01/2020 Tumor Marker   Patient's tumor was tested for the following markers: CA19-9. Results of the tumor marker test revealed 12707.   09/18/2020 -  Chemotherapy    Patient is on Treatment Plan: PANCREATIC MSI-H/DMMR PEMBROLIZUMAB Q21D  REVIEW OF SYSTEMS:   Constitutional: Denies fevers, chills or abnormal weight loss Eyes: Denies blurriness of vision Ears, nose, mouth, throat, and face: Denies mucositis or sore throat Respiratory: Denies cough, dyspnea or wheezes Cardiovascular: Denies palpitation, chest discomfort Gastrointestinal:  Denies nausea, heartburn or change in bowel habits Skin: Denies abnormal skin rashes Lymphatics: Denies new  lymphadenopathy or easy bruising Neurological:Denies numbness, tingling or new weaknesses Behavioral/Psych: Mood is stable, no new changes  Extremities: No lower extremity edema All other systems were reviewed with the patient and are negative.  I have reviewed the past medical history, past surgical history, social history and family history with the patient and they are unchanged from previous note.  ALLERGIES:  is allergic to codeine, other, penicillins, and bactrim [sulfamethoxazole-trimethoprim].  MEDICATIONS:  Current Outpatient Medications  Medication Sig Dispense Refill   citalopram (CELEXA) 20 MG tablet TAKE 1 TABLET BY MOUTH EVERY DAY (Patient taking differently: Take 20 mg by mouth daily.) 90 tablet 4   diclofenac Sodium (VOLTAREN) 1 % GEL APPLY 2-4 GRAMS TO AFFECTED JOINT 4 TIMES DAILY AS NEEDED. (Patient taking differently: Apply 2 g topically 4 (four) times daily as needed (pain).) 400 g 1   diphenhydrAMINE (BENADRYL) 25 MG tablet Take 25 mg by mouth every 6 (six) hours as needed for itching.     enoxaparin (LOVENOX) 100 MG/ML injection Inject 0.9 mLs (90 mg total) into the skin every 12 (twelve) hours. 54 mL 0   estradiol (ESTRACE VAGINAL) 0.1 MG/GM vaginal cream Place 1 Applicatorful vaginally 3 (three) times a week. (Patient taking differently: Place 1 Applicatorful vaginally 3 (three) times a week. Monday Wednesday friday) 42.5 g 12   fluticasone (FLONASE) 50 MCG/ACT nasal spray Place 1 spray into both nostrils daily.     HYDROmorphone (DILAUDID) 8 MG tablet Take 1 tablet (8 mg total) by mouth every 4 (four) hours as needed. (Patient taking differently: Take 8 mg by mouth every 4 (four) hours as needed for moderate pain.) 90 tablet 0   hydrOXYzine (ATARAX/VISTARIL) 10 MG tablet Take 10 mg by mouth in the morning and at bedtime.     lidocaine-prilocaine (EMLA) cream Apply 1 application topically daily as needed (port). Apply to affected area once 30 g 11   metFORMIN  (GLUCOPHAGE) 500 MG tablet Take 2 tablets (1,000 mg total) by mouth 2 (two) times daily with a meal. 180 tablet 11   methadone (DOLOPHINE) 10 MG tablet Take 1 tablet (10 mg total) by mouth every 12 (twelve) hours. 60 tablet 0   omeprazole (PRILOSEC) 40 MG capsule TAKE 1 CAPSULE BY MOUTH EVERY DAY (Patient taking differently: Take 40 mg by mouth daily.) 90 capsule 3   ondansetron (ZOFRAN) 8 MG tablet TAKE 1 TAB BY MOUTH EVERY 8HOURS AS NEEDED FOR REFRACTORY NAUSEA/VOMITING START ON DAY 3AFTER CHEMO (Patient taking differently: Take 8 mg by mouth every 8 (eight) hours as needed for nausea.) 8 tablet 7   prochlorperazine (COMPAZINE) 10 MG tablet TAKE 1 TABLET BY MOUTH EVERY 6 HOURS AS NEEDED (Patient taking differently: Take 10 mg by mouth every 6 (six) hours as needed.) 90 tablet 3   simethicone (MYLICON) 315 MG chewable tablet Chew 125 mg by mouth every 6 (six) hours as needed for flatulence.     No current facility-administered medications for this visit.    PHYSICAL EXAMINATION: ECOG PERFORMANCE STATUS: 1 - Symptomatic but completely ambulatory  LABORATORY DATA:  I have reviewed the data as listed CMP Latest Ref Rng & Units 10/11/2020 09/18/2020  09/01/2020  Glucose 70 - 99 mg/dL 101(H) 147(H) 110(H)  BUN 6 - 20 mg/dL _0 Creatinine 0.44 - 1.00 mg/dL 0.40(L) 0.70 0.70  Sodium 135 - 145 mmol/L 134(L) 134(L) 140  Potassium 3.5 - 5.1 mmol/L 3.7 3.6 4.1  Chloride 98 - 111 mmol/L 98 99 103  CO2 22 - 32 mmol/L _1 Calcium 8.9 - 10.3 mg/dL 8.3(L) 8.7(L) 9.3  Total Protein 6.5 - 8.1 g/dL 5.5(L) 5.9(L) 6.8  Total Bilirubin 0.3 - 1.2 mg/dL 15.7(H) 14.6(HH) 3.4(H)  Alkaline Phos 38 - 126 U/L 459(H) 654(H) 410(H)  AST 15 - 41 U/L 154(H) 234(HH) 175(HH)  ALT 0 - 44 U/L 86(H) 233(H) 249(H)    Lab Results  Component Value Date   WBC 19.2 (H) 10/11/2020   HGB 11.1 (L) 10/11/2020   HCT 34.3 (L) 10/11/2020   MCV 101.8 (H) 10/11/2020   PLT 375 10/11/2020   NEUTROABS 13.8 (H) 10/11/2020      RADIOGRAPHIC STUDIES: I have personally reviewed the radiological images as listed and agreed with the findings in the report. CT Angio Chest PE W and/or Wo Contrast  Result Date: 10/11/2020 CLINICAL DATA:  Short of breath.  DVT in the legs. Pancreatic cancer currentlyColon cancerOvarian/uterine caHysterectomyColon surgery right colectomyDistal pancreas surgery, splenectomy, cholecystectomyStopped chemo 2 months ago EXAM: CT ANGIOGRAPHY CHEST WITH CONTRAST TECHNIQUE: Multidetector CT imaging of the chest was performed using the standard protocol during bolus administration of intravenous contrast. Multiplanar CT image reconstructions and MIPs were obtained to evaluate the vascular anatomy. CONTRAST:  8m OMNIPAQUE IOHEXOL 350 MG/ML SOLN COMPARISON:  CT, 06/03/2020. FINDINGS: Cardiovascular: There are segmental pulmonary emboli in the right lower lobe clearly seen involving the superior segment and posterior basal segment. There is a strand like area of pulmonary embolus in the right lower lobe pulmonary artery above the segmental branch origins and below the right middle lobe artery origins. No other definite pulmonary emboli. Heart is normal in size and configuration. No pericardial effusion. Aorta is normal in caliber. No dissection or atherosclerosis. No evidence of right heart strain. Mediastinum/Nodes: No enlarged mediastinal, hilar, or axillary lymph nodes. Thyroid gland, trachea, and esophagus demonstrate no significant findings. Lungs/Pleura: Trace left pleural effusion. Minor linear atelectasis at the lung bases, most evident in the right middle lobe. Lungs otherwise clear. No pneumothorax. Upper Abdomen: Trace amount of ascites. Heterogeneous attenuation of the liver consistent with heterogeneous fatty infiltration. Musculoskeletal: No fracture or acute finding.  No bone lesion. Review of the MIP images confirms the above findings. IMPRESSION: 1. Small segmental pulmonary emboli to the right  lower lobe superior segment and posterior basilar segment. Additional strand-like small pulmonary embolus in the right lower lobe pulmonary artery. 2. Mild linear atelectasis at the lung bases. Lungs otherwise clear. 3. Trace left pleural effusion. Electronically Signed   By: DLajean ManesM.D.   On: 10/11/2020 13:52   VAS [place] EXTREMITY VENOUS (DVT) (ONLY [facility] & [facility])  Result Date: 10/11/2020  Lower Venous DVT Study Patient Name:  Dana Hicks Date of Exam:   10/11/2020 Medical Rec #: [id number]        Accession #:    [id number] of Birth: 107/24/1977         Patient Gender: F Patient Age:   89years [facility]:      VAS [place] EXTREMITY VENOUS (DVT) Referring Phys: PDebbe Mounts--------------------------------------------------------------------------------  Indications: Pain of inner groin area  RLE - previously DX'd with DVT 10/04/20.  Risk Factors: Cancer Endometrial CA w/ mets (ovary & pancreatic). Anticoagulation: Lovenox. Comparison Study: Previous exam 10/04/20 - positive for gastroc thrombis RLE Performing Technologist: Jody Hill RVT, RDMS  Examination Guidelines: A complete evaluation includes B-mode imaging, spectral Doppler, color Doppler, and power Doppler as needed of all accessible portions of each vessel. Bilateral testing is considered an integral part of a complete examination. Limited examinations for reoccurring indications may be performed as noted. The reflux portion of the exam is performed with the patient in reverse Trendelenburg.  +---------+---------------+---------+-----------+----------+--------------+ RIGHT    CompressibilityPhasicitySpontaneityPropertiesThrombus Aging +---------+---------------+---------+-----------+----------+--------------+ CFV      Full           Yes      Yes                                 +---------+---------------+---------+-----------+----------+--------------+ SFJ      Full                                                         +---------+---------------+---------+-----------+----------+--------------+ FV Prox  Full           Yes      Yes                                 +---------+---------------+---------+-----------+----------+--------------+ FV Mid   Full           Yes      Yes                                 +---------+---------------+---------+-----------+----------+--------------+ FV DistalFull           Yes      Yes                                 +---------+---------------+---------+-----------+----------+--------------+ PFV      Full                                                        +---------+---------------+---------+-----------+----------+--------------+ POP      Full           Yes      Yes                                 +---------+---------------+---------+-----------+----------+--------------+ PTV      Full                                                        +---------+---------------+---------+-----------+----------+--------------+ PERO     Full                                                        +---------+---------------+---------+-----------+----------+--------------+  Gastroc  None           No       No                   Chronic        +---------+---------------+---------+-----------+----------+--------------+   +----+---------------+---------+-----------+----------+--------------+ LEFTCompressibilityPhasicitySpontaneityPropertiesThrombus Aging +----+---------------+---------+-----------+----------+--------------+ CFV Full           Yes      Yes                                 +----+---------------+---------+-----------+----------+--------------+    Summary: RIGHT: - Findings consistent with chronic deep vein thrombosis involving the right gastrocnemius veins. - There is no evidence of superficial venous thrombosis.  - A cystic structure is found in the popliteal fossa.  LEFT: - No evidence of common  femoral vein obstruction.  *See table(s) above for measurements and observations. Electronically signed by Deitra Mayo MD on 10/11/2020 at 3:57:41 PM.    Final    VAS [place] LOWER EXTREMITY VENOUS (DVT) (ONLY [facility] & [facility] 7a-7p)  Result Date: 10/04/2020  Lower Venous DVT Study Patient Name:  Dana Hicks  Date of Exam:   10/04/2020 Medical Rec #: [id number]         Accession #:    [id number] Date of Birth: [birth year]          Patient Gender: F Patient Age:   19 years Exam [facility]  [facility] Procedure:      VAS [place] LOWER EXTREMITY VENOUS (DVT) Referring Phys: Lorre Munroe --------------------------------------------------------------------------------  Indications: Pain.  Comparison Study: 11/21/17 prior Performing Technologist: Archie Patten RVS  Examination Guidelines: A complete evaluation includes B-mode imaging, spectral Doppler, color Doppler, and power Doppler as needed of all accessible portions of each vessel. Bilateral testing is considered an integral part of a complete examination. Limited examinations for reoccurring indications may be performed as noted. The reflux portion of the exam is performed with the patient in reverse Trendelenburg.  +---------+---------------+---------+-----------+----------+-----------------+ RIGHT    CompressibilityPhasicitySpontaneityPropertiesThrombus Aging    +---------+---------------+---------+-----------+----------+-----------------+ CFV      Full           Yes      Yes                                    +---------+---------------+---------+-----------+----------+-----------------+ SFJ      Full                                                           +---------+---------------+---------+-----------+----------+-----------------+ FV Prox  Full                                                           +---------+---------------+---------+-----------+----------+-----------------+ FV Mid   Full                                                            +---------+---------------+---------+-----------+----------+-----------------+  FV DistalFull                                                           +---------+---------------+---------+-----------+----------+-----------------+ PFV      Full                                                           +---------+---------------+---------+-----------+----------+-----------------+ POP      Full           Yes      Yes                                    +---------+---------------+---------+-----------+----------+-----------------+ PTV      Full                                                           +---------+---------------+---------+-----------+----------+-----------------+ PERO     Partial                                                        +---------+---------------+---------+-----------+----------+-----------------+ Gastroc  None                                         Age Indeterminate +---------+---------------+---------+-----------+----------+-----------------+   +----+---------------+---------+-----------+----------+--------------+ LEFTCompressibilityPhasicitySpontaneityPropertiesThrombus Aging +----+---------------+---------+-----------+----------+--------------+ CFV Full           Yes      Yes                                 +----+---------------+---------+-----------+----------+--------------+    Summary: RIGHT: - Findings consistent with age indeterminate deep vein thrombosis involving the right gastrocnemius veins. - No cystic structure found in the popliteal fossa.  LEFT: - No evidence of common femoral vein obstruction.  *See table(s) above for measurements and observations. Electronically signed by Harold Barban MD on 10/04/2020 at 1:22:18 PM.    Final     I discussed the assessment and treatment plan with the patient. The patient was provided an opportunity to ask questions and all were answered. The patient agreed  with the plan and demonstrated an understanding of the instructions. The patient was advised to call back or seek an in-person evaluation if the symptoms worsen or if the condition fails to improve as anticipated.    I spent 20 minutes for the appointment reviewing test results, discuss management and coordination of care.  Heath Lark, MD 10/13/2020 1:01 PM

## 2020-10-13 NOTE — Assessment & Plan Note (Signed)
She is currently on palliative care with hospice Continue supportive care

## 2020-10-13 NOTE — Assessment & Plan Note (Signed)
Her recent groin pain is stable now She will continue chronic pain management She will continue Lovenox indefinitely We discussed the risk and benefits of adding aspirin therapy in the future if needed So far, she is doing well without bleeding

## 2020-10-14 DIAGNOSIS — C569 Malignant neoplasm of unspecified ovary: Secondary | ICD-10-CM | POA: Diagnosis not present

## 2020-10-14 DIAGNOSIS — G620 Drug-induced polyneuropathy: Secondary | ICD-10-CM | POA: Diagnosis not present

## 2020-10-14 DIAGNOSIS — Z87442 Personal history of urinary calculi: Secondary | ICD-10-CM | POA: Diagnosis not present

## 2020-10-14 DIAGNOSIS — C189 Malignant neoplasm of colon, unspecified: Secondary | ICD-10-CM | POA: Diagnosis not present

## 2020-10-14 DIAGNOSIS — R634 Abnormal weight loss: Secondary | ICD-10-CM | POA: Diagnosis not present

## 2020-10-14 DIAGNOSIS — C252 Malignant neoplasm of tail of pancreas: Secondary | ICD-10-CM | POA: Diagnosis not present

## 2020-10-14 DIAGNOSIS — Z1509 Genetic susceptibility to other malignant neoplasm: Secondary | ICD-10-CM | POA: Diagnosis not present

## 2020-10-14 DIAGNOSIS — C541 Malignant neoplasm of endometrium: Secondary | ICD-10-CM | POA: Diagnosis not present

## 2020-10-14 DIAGNOSIS — Z9221 Personal history of antineoplastic chemotherapy: Secondary | ICD-10-CM | POA: Diagnosis not present

## 2020-10-14 DIAGNOSIS — R110 Nausea: Secondary | ICD-10-CM | POA: Diagnosis not present

## 2020-10-14 DIAGNOSIS — E8809 Other disorders of plasma-protein metabolism, not elsewhere classified: Secondary | ICD-10-CM | POA: Diagnosis not present

## 2020-10-14 DIAGNOSIS — C787 Secondary malignant neoplasm of liver and intrahepatic bile duct: Secondary | ICD-10-CM | POA: Diagnosis not present

## 2020-10-14 DIAGNOSIS — Z8619 Personal history of other infectious and parasitic diseases: Secondary | ICD-10-CM | POA: Diagnosis not present

## 2020-10-14 DIAGNOSIS — R630 Anorexia: Secondary | ICD-10-CM | POA: Diagnosis not present

## 2020-10-14 DIAGNOSIS — R17 Unspecified jaundice: Secondary | ICD-10-CM | POA: Diagnosis not present

## 2020-10-15 ENCOUNTER — Other Ambulatory Visit: Payer: Self-pay

## 2020-10-15 ENCOUNTER — Encounter (HOSPITAL_COMMUNITY): Payer: Self-pay | Admitting: Internal Medicine

## 2020-10-15 ENCOUNTER — Emergency Department (HOSPITAL_COMMUNITY): Payer: BC Managed Care – PPO

## 2020-10-15 ENCOUNTER — Telehealth: Payer: Self-pay

## 2020-10-15 ENCOUNTER — Observation Stay (HOSPITAL_COMMUNITY)
Admission: EM | Admit: 2020-10-15 | Discharge: 2020-10-15 | Disposition: A | Discharge status: Home / Self Care | Payer: BC Managed Care – PPO | Attending: Internal Medicine | Admitting: Internal Medicine

## 2020-10-15 DIAGNOSIS — R0602 Shortness of breath: Secondary | ICD-10-CM | POA: Diagnosis not present

## 2020-10-15 DIAGNOSIS — Z515 Encounter for palliative care: Secondary | ICD-10-CM

## 2020-10-15 DIAGNOSIS — Z85038 Personal history of other malignant neoplasm of large intestine: Secondary | ICD-10-CM | POA: Diagnosis not present

## 2020-10-15 DIAGNOSIS — E8809 Other disorders of plasma-protein metabolism, not elsewhere classified: Secondary | ICD-10-CM | POA: Diagnosis not present

## 2020-10-15 DIAGNOSIS — R6520 Severe sepsis without septic shock: Secondary | ICD-10-CM | POA: Insufficient documentation

## 2020-10-15 DIAGNOSIS — Z8619 Personal history of other infectious and parasitic diseases: Secondary | ICD-10-CM | POA: Diagnosis not present

## 2020-10-15 DIAGNOSIS — G893 Neoplasm related pain (acute) (chronic): Secondary | ICD-10-CM | POA: Diagnosis not present

## 2020-10-15 DIAGNOSIS — Z96652 Presence of left artificial knee joint: Secondary | ICD-10-CM | POA: Diagnosis not present

## 2020-10-15 DIAGNOSIS — Z1509 Genetic susceptibility to other malignant neoplasm: Secondary | ICD-10-CM | POA: Diagnosis not present

## 2020-10-15 DIAGNOSIS — Z79899 Other long term (current) drug therapy: Secondary | ICD-10-CM | POA: Insufficient documentation

## 2020-10-15 DIAGNOSIS — A419 Sepsis, unspecified organism: Secondary | ICD-10-CM | POA: Diagnosis not present

## 2020-10-15 DIAGNOSIS — C189 Malignant neoplasm of colon, unspecified: Secondary | ICD-10-CM | POA: Diagnosis not present

## 2020-10-15 DIAGNOSIS — D649 Anemia, unspecified: Secondary | ICD-10-CM | POA: Diagnosis not present

## 2020-10-15 DIAGNOSIS — R17 Unspecified jaundice: Secondary | ICD-10-CM | POA: Diagnosis not present

## 2020-10-15 DIAGNOSIS — G620 Drug-induced polyneuropathy: Secondary | ICD-10-CM | POA: Diagnosis not present

## 2020-10-15 DIAGNOSIS — Z87891 Personal history of nicotine dependence: Secondary | ICD-10-CM | POA: Insufficient documentation

## 2020-10-15 DIAGNOSIS — C252 Malignant neoplasm of tail of pancreas: Secondary | ICD-10-CM | POA: Diagnosis not present

## 2020-10-15 DIAGNOSIS — R0600 Dyspnea, unspecified: Secondary | ICD-10-CM

## 2020-10-15 DIAGNOSIS — K7290 Hepatic failure, unspecified without coma: Secondary | ICD-10-CM

## 2020-10-15 DIAGNOSIS — C168 Malignant neoplasm of overlapping sites of stomach: Secondary | ICD-10-CM | POA: Diagnosis not present

## 2020-10-15 DIAGNOSIS — Z8542 Personal history of malignant neoplasm of other parts of uterus: Secondary | ICD-10-CM | POA: Insufficient documentation

## 2020-10-15 DIAGNOSIS — Z20822 Contact with and (suspected) exposure to covid-19: Secondary | ICD-10-CM | POA: Insufficient documentation

## 2020-10-15 DIAGNOSIS — C541 Malignant neoplasm of endometrium: Secondary | ICD-10-CM | POA: Diagnosis not present

## 2020-10-15 DIAGNOSIS — C569 Malignant neoplasm of unspecified ovary: Secondary | ICD-10-CM | POA: Diagnosis not present

## 2020-10-15 DIAGNOSIS — Z8543 Personal history of malignant neoplasm of ovary: Secondary | ICD-10-CM | POA: Diagnosis not present

## 2020-10-15 DIAGNOSIS — Z7984 Long term (current) use of oral hypoglycemic drugs: Secondary | ICD-10-CM | POA: Diagnosis not present

## 2020-10-15 DIAGNOSIS — E162 Hypoglycemia, unspecified: Secondary | ICD-10-CM

## 2020-10-15 DIAGNOSIS — R634 Abnormal weight loss: Secondary | ICD-10-CM | POA: Diagnosis not present

## 2020-10-15 DIAGNOSIS — Z87442 Personal history of urinary calculi: Secondary | ICD-10-CM | POA: Diagnosis not present

## 2020-10-15 DIAGNOSIS — E11649 Type 2 diabetes mellitus with hypoglycemia without coma: Secondary | ICD-10-CM | POA: Insufficient documentation

## 2020-10-15 DIAGNOSIS — Z8507 Personal history of malignant neoplasm of pancreas: Secondary | ICD-10-CM | POA: Insufficient documentation

## 2020-10-15 DIAGNOSIS — R110 Nausea: Secondary | ICD-10-CM | POA: Diagnosis not present

## 2020-10-15 DIAGNOSIS — Z743 Need for continuous supervision: Secondary | ICD-10-CM | POA: Diagnosis not present

## 2020-10-15 DIAGNOSIS — I959 Hypotension, unspecified: Secondary | ICD-10-CM | POA: Diagnosis not present

## 2020-10-15 DIAGNOSIS — R000 Tachycardia, unspecified: Secondary | ICD-10-CM | POA: Diagnosis not present

## 2020-10-15 DIAGNOSIS — C787 Secondary malignant neoplasm of liver and intrahepatic bile duct: Secondary | ICD-10-CM | POA: Diagnosis not present

## 2020-10-15 DIAGNOSIS — Z9221 Personal history of antineoplastic chemotherapy: Secondary | ICD-10-CM | POA: Diagnosis not present

## 2020-10-15 DIAGNOSIS — R630 Anorexia: Secondary | ICD-10-CM | POA: Diagnosis not present

## 2020-10-15 LAB — CBC WITH DIFFERENTIAL/PLATELET
Abs Immature Granulocytes: 3.7 10*3/uL — ABNORMAL HIGH (ref 0.00–0.07)
Basophils Absolute: 0 10*3/uL (ref 0.0–0.1)
Basophils Relative: 0 %
Eosinophils Absolute: 0 10*3/uL (ref 0.0–0.5)
Eosinophils Relative: 0 %
HCT: 18 % — ABNORMAL LOW (ref 36.0–46.0)
Hemoglobin: 5.4 g/dL — CL (ref 12.0–15.0)
Lymphocytes Relative: 9 %
Lymphs Abs: 3.3 10*3/uL (ref 0.7–4.0)
MCH: 34 pg (ref 26.0–34.0)
MCHC: 30 g/dL (ref 30.0–36.0)
MCV: 113.2 fL — ABNORMAL HIGH (ref 80.0–100.0)
Monocytes Absolute: 1.8 10*3/uL — ABNORMAL HIGH (ref 0.1–1.0)
Monocytes Relative: 5 %
Myelocytes: 10 %
Neutro Abs: 27.8 10*3/uL — ABNORMAL HIGH (ref 1.7–7.7)
Neutrophils Relative %: 76 %
Platelets: 335 10*3/uL (ref 150–400)
RBC: 1.59 MIL/uL — ABNORMAL LOW (ref 3.87–5.11)
RDW: 17.2 % — ABNORMAL HIGH (ref 11.5–15.5)
WBC: 36.6 10*3/uL — ABNORMAL HIGH (ref 4.0–10.5)
nRBC: 1 /100 WBC — ABNORMAL HIGH
nRBC: 1.2 % — ABNORMAL HIGH (ref 0.0–0.2)

## 2020-10-15 LAB — COMPREHENSIVE METABOLIC PANEL
ALT: 110 U/L — ABNORMAL HIGH (ref 0–44)
AST: 386 U/L — ABNORMAL HIGH (ref 15–41)
Albumin: 1.6 g/dL — ABNORMAL LOW (ref 3.5–5.0)
Alkaline Phosphatase: 499 U/L — ABNORMAL HIGH (ref 38–126)
Anion gap: 26 — ABNORMAL HIGH (ref 5–15)
BUN: 26 mg/dL — ABNORMAL HIGH (ref 6–20)
CO2: 9 mmol/L — ABNORMAL LOW (ref 22–32)
Calcium: 7.8 mg/dL — ABNORMAL LOW (ref 8.9–10.3)
Chloride: 95 mmol/L — ABNORMAL LOW (ref 98–111)
Creatinine, Ser: 1.22 mg/dL — ABNORMAL HIGH (ref 0.44–1.00)
GFR, Estimated: 55 mL/min — ABNORMAL LOW (ref >60)
Glucose, Bld: 32 mg/dL — CL (ref 70–99)
Potassium: 4.5 mmol/L (ref 3.5–5.1)
Sodium: 130 mmol/L — ABNORMAL LOW (ref 135–145)
Total Bilirubin: 20.4 mg/dL (ref 0.3–1.2)
Total Protein: 4.4 g/dL — ABNORMAL LOW (ref 6.5–8.1)

## 2020-10-15 LAB — CBG MONITORING, ED
Glucose-Capillary: <10 mg/dL — CL (ref 70–99)
Glucose-Capillary: 148 mg/dL — ABNORMAL HIGH (ref 70–99)
Glucose-Capillary: 125 mg/dL — ABNORMAL HIGH (ref 70–99)
Glucose-Capillary: 128 mg/dL — ABNORMAL HIGH (ref 70–99)
Glucose-Capillary: 144 mg/dL — ABNORMAL HIGH (ref 70–99)
Glucose-Capillary: 154 mg/dL — ABNORMAL HIGH (ref 70–99)
Glucose-Capillary: 156 mg/dL — ABNORMAL HIGH (ref 70–99)

## 2020-10-15 LAB — SARS CORONAVIRUS 2 (TAT 6-24 HRS): SARS Coronavirus 2: NEGATIVE

## 2020-10-15 LAB — TROPONIN I (HIGH SENSITIVITY): Troponin I (High Sensitivity): 47 ng/L — ABNORMAL HIGH (ref <18)

## 2020-10-15 LAB — PREPARE RBC (CROSSMATCH)

## 2020-10-15 MED ORDER — DEXTROSE 50 % IV SOLN: 1.0000 | Freq: Once | INTRAVENOUS | Status: AC

## 2020-10-15 MED ORDER — DEXTROSE 50 % IV SOLN
INTRAVENOUS | Status: AC
  Administered 2020-10-15: 50 mL via INTRAVENOUS
  Filled 2020-10-15: qty 50

## 2020-10-15 MED ORDER — ONDANSETRON HCL 4 MG/2ML IJ SOLN
4.0000 mg | Freq: Four times a day (QID) | INTRAMUSCULAR | Status: DC | PRN
  Administered 2020-10-15: 4 mg via INTRAVENOUS
  Filled 2020-10-15: qty 2

## 2020-10-15 MED ORDER — HYDROMORPHONE HCL 2 MG/ML IJ SOLN
2.0000 mg | INTRAMUSCULAR | Status: DC | PRN
  Administered 2020-10-15: 2 mg via INTRAVENOUS
  Filled 2020-10-15: qty 1

## 2020-10-15 MED ORDER — HYDROMORPHONE HCL 2 MG/ML IJ SOLN: 2.0000 mg | INTRAMUSCULAR | 0 refills | Status: AC | PRN

## 2020-10-15 MED ORDER — METHADONE HCL 5 MG PO TABS: 10.0000 mg | ORAL_TABLET | Freq: Every day | ORAL | Status: DC

## 2020-10-15 MED ORDER — DEXTROSE 50 % IV SOLN: 1.0000 | INTRAVENOUS | Status: DC | PRN

## 2020-10-15 MED ORDER — SODIUM CHLORIDE 0.9 % IV SOLN: 12.5000 mg | Freq: Four times a day (QID) | INTRAVENOUS | Status: AC | PRN

## 2020-10-15 MED ORDER — ONDANSETRON HCL 4 MG/2ML IJ SOLN
4.0000 mg | Freq: Once | INTRAMUSCULAR | Status: AC
  Administered 2020-10-15: 4 mg via INTRAVENOUS
  Filled 2020-10-15: qty 2

## 2020-10-15 MED ORDER — DEXTROSE 50 % IV SOLN: 1.0000 | Freq: Once | INTRAVENOUS | Status: DC

## 2020-10-15 MED ORDER — HYDROMORPHONE HCL 2 MG/ML IJ SOLN
2.0000 mg | Freq: Once | INTRAMUSCULAR | Status: AC
  Administered 2020-10-15: 2 mg via INTRAVENOUS
  Filled 2020-10-15: qty 1

## 2020-10-15 MED ORDER — FENTANYL CITRATE (PF) 100 MCG/2ML IJ SOLN: 12.5000 ug | INTRAMUSCULAR | Status: DC | PRN

## 2020-10-15 MED ORDER — SODIUM CHLORIDE 0.9 % IV SOLN: Freq: Once | INTRAVENOUS | Status: AC

## 2020-10-15 MED ORDER — SODIUM CHLORIDE 0.9 % IV SOLN
10.0000 mL/h | Freq: Once | INTRAVENOUS | Status: AC
  Administered 2020-10-15: 10 mL/h via INTRAVENOUS

## 2020-10-15 MED ORDER — LORAZEPAM 2 MG/ML PO CONC: 1.0000 mg | ORAL | 0 refills | Status: AC | PRN

## 2020-10-15 MED ORDER — SODIUM CHLORIDE 0.9 % IV SOLN
12.5000 mg | Freq: Four times a day (QID) | INTRAVENOUS | Status: DC | PRN
  Administered 2020-10-15: 12.5 mg via INTRAVENOUS
  Filled 2020-10-15: qty 12.5

## 2020-10-15 MED ORDER — LORAZEPAM 2 MG/ML PO CONC
1.0000 mg | ORAL | Status: DC | PRN
  Administered 2020-10-15: 1 mg via ORAL
  Filled 2020-10-15: qty 0.5

## 2020-10-15 MED ORDER — DEXTROSE 10 % IV SOLN: INTRAVENOUS | Status: DC

## 2020-10-15 MED ORDER — HYDROMORPHONE HCL 1 MG/ML IJ SOLN
1.0000 mg | Freq: Once | INTRAMUSCULAR | Status: AC
  Administered 2020-10-15: 1 mg via INTRAVENOUS
  Filled 2020-10-15: qty 1

## 2020-10-15 MED ORDER — HYDROMORPHONE HCL 1 MG/ML IJ SOLN
1.0000 mg | Freq: Once | INTRAMUSCULAR | Status: AC
Start: 2020-10-15 — End: 2020-10-15
  Administered 2020-10-15: 1 mg via INTRAVENOUS
  Filled 2020-10-15: qty 1

## 2020-10-15 MED ORDER — HYDROMORPHONE HCL 2 MG PO TABS: 8.0000 mg | ORAL_TABLET | ORAL | Status: DC | PRN

## 2020-10-15 NOTE — H&P (Signed)
History and Physical    Dana Hicks WNI:[id number] DOB: 11/16/1975 DOA: 10/15/2020  PCP: Heath Lark, MD Patient coming from: Home  Chief Complaint: Shortness of breath  HPI: Dana Hicks is a 45 y.o. female with medical history significant of pancreatic cancer currently on palliative care with hospice, progressive liver failure, noninsulin-dependent type II diabetes, rheumatoid arthritis.  She was seen in the ED on 10/04/2020 and diagnosed with right lower extremity DVT for which she was started on Lovenox for anticoagulation.  Seen again in the ED on 8/20 for shortness of breath and CT angiogram revealed small segmental PE to the right lower lobe superior segment and posterior basilar segment.  Additional strand-like small PE in the right lower lobe pulmonary artery.  Given her hospice status, she was discharged from the ED with plan to follow-up with her oncologist.  In the ED, tachycardic and tachypneic.  Blood pressure soft.  Not hypoxic.  Labs showing WBC 38.1 with neutrophilic predominance.  Hemoglobin 5.4, was 11.1 four days ago.  Sodium 130.  Bicarb 9, anion gap 26.  Blood glucose 32.  BUN 26, creatinine 1.2 (was 0.4 four days ago).  Albumin 1.6.  LFTs elevated (AST 386, ALT 110, alk phos 499, T bili 20.4) and worse compared to previous labs.  High-sensitivity troponin 47.  EKG without acute ischemic changes.  Chest x-ray showing pulmonary hypoinflation and no acute finding. ED PA had a goals of care discussion with the patient and her family and they expressed that they want comfort measures - treatment for her pain, severe hypoglycemia, and symptomatic anemia.  No additional work-up or interventions. Plan is for her to go to [facility] but they do not have any bed availability until tomorrow morning.  Patient states she started feeling short of breath 2 days ago.  She is taking Lovenox for recently diagnosed right lower extremity DVT/PE.  Denies hematemesis, hematochezia, or melena.   Her stool is yellow in color.  She has felt nauseous but has not vomited.  Denies abdominal pain.  She is not eating.  Does report severe pain in her right lower extremity.  Reports bruises on her abdomen and right leg.  Review of Systems:  All systems reviewed and apart from history of presenting illness, are negative.  Past Medical History:  Diagnosis Date   Allergic rhinitis, seasonal    Cecal cancer ([place]) 2001   Stage II (T3N0)  04-11-2001cecum-partial colectomy and completed chemo 2002   Depression    Diabetes mellitus without complication ([place])    Endometrial cancer ([place]) 08/2017   Stage IA, Grade 1   GERD (gastroesophageal reflux disease)    History of MRSA infection 01/2017   followed by infectious disease center--  recurrent pustular folliculitis   Nephrolithiasis    bilateral nonobstructive calculi per CT 08-18-2017   OA (osteoarthritis)    Ovarian cancer, right ([place]) 08/2017   Stage II Grade 2 Endometrioid   Pancreatic cancer ([place])    Rheumatoid arthritis [facility])    rheumatologist-  dr devenswar-- treated w/ oral prednisone daily and methotrexate injection every 3 wks    Past Surgical History:  Procedure Laterality Date   BREAST LUMPECTOMY WITH RADIOACTIVE SEED LOCALIZATION Left 06/28/2014   Benign Procedure: RADIOACTIVE SEED LOCALIZATION LEFT BREAST LUMPECTOMY;  Surgeon: Excell Seltzer, MD;  Location: [facility];  Service: General;  Laterality: Left;   COLONOSCOPY  06/19/14   ESOPHAGOGASTRODUODENOSCOPY (EGD) WITH PROPOFOL N/A 06/07/2019   Procedure: ESOPHAGOGASTRODUODENOSCOPY (EGD) WITH PROPOFOL;  Surgeon:  [name], MD;  Location: [facility];  Service: Endoscopy;  Laterality: N/A;   EUS N/A 06/07/2019   Procedure: UPPER ENDOSCOPIC ULTRASOUND (EUS) LINEAR;  Surgeon: [name], MD;  Location: [facility];  Service: Endoscopy;  Laterality: N/A;   EYE SURGERY Left    plug in tear duct   FINE NEEDLE ASPIRATION N/A 06/07/2019   Procedure: FINE  NEEDLE ASPIRATION (FNA) LINEAR;  Surgeon: [name], MD;  Location: [facility];  Service: Endoscopy;  Laterality: N/A;   IR IMAGING GUIDED PORT INSERTION  09/26/2017   KNEE ARTHROSCOPY     PANCREATECTOMY  11/25/2017   PARTIAL KNEE ARTHROPLASTY Left 11/27/2018   Procedure: UNICOMPARTMENTAL KNEE;  Surgeon: Vickey Huger, MD;  Location: [facility];  Service: Orthopedics;  Laterality: Left;   PORT A CATH REVISION  2001   in and out   RIGHT COLECTOMY  06/02/2009   cecum cancer   ROBOTIC ASSISTED TOTAL HYSTERECTOMY WITH BILATERAL SALPINGO OOPHERECTOMY Bilateral 08/30/2017   Procedure: XI ROBOTIC ASSISTED TOTAL  HYSTERECTOMY BILATERAL  SALPINGO OOPHORECTOMY; LYSIS OF ADHESIONS;  Surgeon: Isabel Caprice, MD;  Location: [facility];  Service: Gynecology;  Laterality: Bilateral;   SPLENECTOMY, PARTIAL  11/25/2017     reports that she quit smoking about 22 years ago. Her smoking use included cigarettes. She has a 5.00 pack-year smoking history. She has never used smokeless tobacco. She reports that she does not currently use alcohol. She reports that she does not use drugs.  Allergies  Allergen Reactions   Codeine Other (See Comments)    headaches   Other     Other reaction(s): migraine headaches Other reaction(s): GI   Penicillins     Severe headaches - has tolerated amoxicillin Has patient had a PCN reaction causing immediate rash, facial/tongue/throat swelling, SOB or lightheadedness with hypotension: No Has patient had a PCN reaction causing severe rash involving mucus membranes or skin necrosis: No Has patient had a PCN reaction that required hospitalization: No Has patient had a PCN reaction occurring within the last 10 years: No If all of the above answers are "NO", then may proceed with Cephalosporin use.    Bactrim [Sulfamethoxazole-Trimethoprim] Rash    Family History  Problem Relation Age of Onset   Arthritis/Rheumatoid Mother    Uterine cancer Mother 68   Allergic rhinitis Father     Heart disease Father    Drug abuse Son    Other Maternal Aunt        Unknown GYN CA   Colon cancer Maternal Uncle 68   Rectal cancer Maternal Uncle    Diabetes Maternal Grandmother    Cancer Maternal Grandfather        d. 2s of liver/lung cancer   Angioedema Neg Hx    Asthma Neg Hx    Atopy Neg Hx    Eczema Neg Hx    Immunodeficiency Neg Hx    Urticaria Neg Hx    Esophageal cancer Neg Hx    Stomach cancer Neg Hx     Prior to Admission medications   Medication Sig Start Date End Date Taking? Authorizing Provider  citalopram (CELEXA) 20 MG tablet TAKE 1 TABLET BY MOUTH EVERY DAY Patient taking differently: Take 20 mg by mouth daily. 08/19/20   Heath Lark, MD  diclofenac Sodium (VOLTAREN) 1 % GEL APPLY 2-4 GRAMS TO AFFECTED JOINT 4 TIMES DAILY AS NEEDED. Patient taking differently: Apply 2 g topically 4 (four) times daily as needed (pain). 09/03/19   Bo Merino, MD  diphenhydrAMINE (BENADRYL) 25 MG tablet Take 25 mg by mouth every 6 (six) hours as needed for itching.    [provider]  enoxaparin (LOVENOX) 100 MG/ML injection Inject 0.9 mLs (90 mg total) into the skin every 12 (twelve) hours. 10/04/20 11/03/20  Arnaldo Natal, MD  estradiol (ESTRACE VAGINAL) 0.1 MG/GM vaginal cream Place 1 Applicatorful vaginally 3 (three) times a week. Patient taking differently: Place 1 Applicatorful vaginally 3 (three) times a week. Monday Wednesday friday 02/27/20   Heath Lark, MD  fluticasone (FLONASE) 50 MCG/ACT nasal spray Place 1 spray into both nostrils daily.    [provider]  HYDROmorphone (DILAUDID) 8 MG tablet Take 1 tablet (8 mg total) by mouth every 4 (four) hours as needed. Patient taking differently: Take 8 mg by mouth every 4 (four) hours as needed for moderate pain. 09/17/20   Heath Lark, MD  hydrOXYzine (ATARAX/VISTARIL) 10 MG tablet Take 10 mg by mouth in the morning and at bedtime.    [provider]  lidocaine-prilocaine (EMLA) cream Apply 1  application topically daily as needed (port). Apply to affected area once 12/07/18   Heath Lark, MD  metFORMIN (GLUCOPHAGE) 500 MG tablet Take 2 tablets (1,000 mg total) by mouth 2 (two) times daily with a meal. 07/08/20   Heath Lark, MD  methadone (DOLOPHINE) 10 MG tablet Take 1 tablet (10 mg total) by mouth every 12 (twelve) hours. 10/13/20   Heath Lark, MD  omeprazole (PRILOSEC) 40 MG capsule TAKE 1 CAPSULE BY MOUTH EVERY DAY Patient taking differently: Take 40 mg by mouth daily. 03/07/20   Heath Lark, MD  ondansetron (ZOFRAN) 8 MG tablet TAKE 1 TAB BY MOUTH EVERY 8HOURS AS NEEDED FOR REFRACTORY NAUSEA/VOMITING START ON DAY 3AFTER CHEMO Patient taking differently: Take 8 mg by mouth every 8 (eight) hours as needed for nausea. 09/08/20   Heath Lark, MD  prochlorperazine (COMPAZINE) 10 MG tablet TAKE 1 TABLET BY MOUTH EVERY 6 HOURS AS NEEDED Patient taking differently: Take 10 mg by mouth every 6 (six) hours as needed. 01/15/20   Heath Lark, MD  simethicone (MYLICON) 297 MG chewable tablet Chew 125 mg by mouth every 6 (six) hours as needed for flatulence.    [provider]    Physical Exam: Vitals:   10/15/20 0457 10/15/20 0500 10/15/20 0512 10/15/20 0515  BP: 103/73 103/73 106/60 105/60  Pulse: 96 96 92 96  Resp: 18 (!) 24 (!) 25 20  Temp: 97.6 F (36.4 C)  (!) 97.4 F (36.3 C)   TempSrc: Oral  Oral   SpO2:  100%  100%  Weight:      Height:        Physical Exam Constitutional:      Appearance: She is ill-appearing.  HENT:     Head: Normocephalic and atraumatic.  Eyes:     General: Scleral icterus present.     Extraocular Movements: Extraocular movements intact.  Cardiovascular:     Rate and Rhythm: Normal rate and regular rhythm.     Pulses: Normal pulses.  Pulmonary:     Effort: Pulmonary effort is normal. No respiratory distress.     Breath sounds: Normal breath sounds. No wheezing or rales.  Abdominal:     General: Bowel sounds are normal. There is  distension.     Palpations: Abdomen is soft.     Tenderness: There is no abdominal tenderness. There is no guarding.  Musculoskeletal:        General: No swelling or tenderness.  Cervical back: Normal range of motion and neck supple.  Skin:    Coloration: Skin is jaundiced.     Comments: Extensive ecchymosis noted on abdomen and right lower extremity  Neurological:     General: No focal deficit present.     Mental Status: She is alert and oriented to person, place, and time.     Labs on Admission: I have personally reviewed following labs and imaging studies  CBC: Recent Labs  Lab 10/11/20 1109 10/15/20 0144  WBC 19.2* 36.6*  NEUTROABS 13.8* 27.8*  HGB 11.1* 5.4*  HCT 34.3* 18.0*  MCV 101.8* 113.2*  PLT 375 665   Basic Metabolic Panel: Recent Labs  Lab 10/11/20 1109 10/15/20 0144  NA 134* 130*  K 3.7 4.5  CL 98 95*  CO2 26 9*  GLUCOSE 101* 32*  BUN 8 26*  CREATININE 0.40* 1.22*  CALCIUM 8.3* 7.8*   GFR: Estimated Creatinine Clearance: 60.8 mL/min (A) (by C-G formula based on SCr of 1.22 mg/dL (H)). Liver Function Tests: Recent Labs  Lab 10/11/20 1109 10/15/20 0144  AST 154* 386*  ALT 86* 110*  ALKPHOS 459* 499*  BILITOT 15.7* 20.4*  PROT 5.5* 4.4*  ALBUMIN 2.0* 1.6*   No results for input(s): LIPASE, AMYLASE in the last 168 hours. No results for input(s): AMMONIA in the last 168 hours. Coagulation Profile: No results for input(s): INR, PROTIME in the last 168 hours. Cardiac Enzymes: No results for input(s): CKTOTAL, CKMB, CKMBINDEX, TROPONINI in the last 168 hours. BNP (last 3 results) No results for input(s): PROBNP in the last 8760 hours. HbA1C: No results for input(s): HGBA1C in the last 72 hours. CBG: Recent Labs  Lab 10/15/20 0337 10/15/20 0442 10/15/20 0523  GLUCAP 148* 128* 125*   Lipid Profile: No results for input(s): CHOL, HDL, LDLCALC, TRIG, CHOLHDL, LDLDIRECT in the last 72 hours. Thyroid Function Tests: No results for  input(s): TSH, T4TOTAL, FREET4, T3FREE, THYROIDAB in the last 72 hours. Anemia Panel: No results for input(s): VITAMINB12, FOLATE, FERRITIN, TIBC, IRON, RETICCTPCT in the last 72 hours. Urine analysis:    Component Value Date/Time   COLORURINE YELLOW 01/28/2020 1226   APPEARANCEUR CLEAR 01/28/2020 1226   LABSPEC 1.012 01/28/2020 1226   PHURINE 6.0 01/28/2020 1226   GLUCOSEU NEGATIVE 01/28/2020 1226   HGBUR NEGATIVE 01/28/2020 1226   BILIRUBINUR NEGATIVE 01/28/2020 1226   KETONESUR NEGATIVE 01/28/2020 1226   PROTEINUR NEGATIVE 01/28/2020 1226   NITRITE NEGATIVE 01/28/2020 1226   LEUKOCYTESUR NEGATIVE 01/28/2020 1226    Radiological Exams on Admission: DG Chest Port 1 View  Result Date: 10/15/2020 CLINICAL DATA:  Dyspnea EXAM: PORTABLE CHEST 1 VIEW COMPARISON:  None. FINDINGS: Lungs volumes are small, but are symmetric and are clear. No pneumothorax or pleural effusion. Cardiac size within normal limits. Right internal jugular chest port is unchanged with its tip within the right atrium. Pulmonary vascularity is normal. Osseous structures are age-appropriate. No acute bone abnormality. IMPRESSION: Pulmonary hypoinflation Electronically Signed   By: Fidela Salisbury M.D.   On: 10/15/2020 02:26    EKG: Independently reviewed.  Sinus tachycardia, no acute ischemic changes.  Assessment/Plan Principal Problem:   End of life care Active Problems:   Dyspnea   Severe sepsis (HCC)   Severe anemia   Liver failure ([place])   End-of-life care Patient with history of pancreatic cancer currently on palliative care with hospice, recently diagnosed DVT/PE started on Lovenox here with multiorgan failure -progressive liver failure, severe sepsis, severe anemia, severe metabolic acidosis, severe hypoglycemia,  acute renal failure, and elevated troponin.  I had a goals of care discussion with the patient and explained to her that she has approached end-of-life.  Patient's husband and father present at  bedside expressed their understanding.  Patient told me she knows she is dying and just wants to be comfortable.  She wants treatment for her right lower extremity pain, severe hypoglycemia, and symptomatic anemia.  She does not want any further work-up or interventions.  Patient confirmed that she is DNR/DNI.  Family wants her to go to [facility] but there are no beds available until tomorrow morning. -Palliative care consulted  Dyspnea Likely multifactorial from PE and worsening anemia. Chest x-ray showing pulmonary hypoinflation and no acute finding.  Mildly tachypneic at present but not hypoxic. -Comfort care. Continuous pulse ox, supplemental oxygen as needed. Blood transfusions for severe anemia.  Severe sepsis Significant leukocytosis, tachycardia, tachypnea, and soft blood pressure. Chest x-ray not suggestive of pneumonia. -Comfort care.  Patient does not want to do any further work-up or interventions.  Severe anemia Suspected to be related to acute blood loss in the setting of anticoagulant use. Hemoglobin 5.4, was 11.1 on labs done 4 days ago.  Patient is not endorsing any symptoms of GI bleed.  Anemia is likely contributing to her dyspnea. -Comfort care.  1 unit PRBCs ordered in the ED.  Progressive liver failure LFTs elevated (AST 386, ALT 110, alk phos 499, T bili 20.4) and worse compared to previous labs. -Comfort care.  Patient does not want to do any further work-up or interventions.  Severe hypoglycemia Noninsulin-dependent type 2 diabetes Likely secondary to very poor oral intake.  Not on a sulfonylurea.  Blood glucose 32 on initial labs and was started on D10 infusion.  Blood glucose now improved to 120s. -Continue D10 infusion, monitor CBG every 6 hours  Acute renal failure Likely prerenal from dehydration.  BUN 26, creatinine 1.2 (was 0.4 four days ago). -Comfort care.  Patient does not want to do any further work-up or interventions.  Currently on dextrose infusion  for hypoglycemia.  Severe metabolic acidosis Likely secondary to progressive liver failure and acute renal failure. Bicarb 9, anion gap 26. -Comfort care. Patient does not want to do any further work-up or interventions.  Elevated troponin High-sensitivity troponin 47.  EKG without acute ischemic changes.  Patient is not endorsing chest pain. -Comfort care. Patient does not want to do any further work-up or interventions.  Severe protein calorie malnutrition -Albumin 1.6  Mild hyponatremia -Comfort care  Recently diagnosed DVT/PE -Hold Lovenox given severe anemia and progressive liver failure. -Fentanyl as needed for severe right lower extremity pain  Code Status: DNR/DNI Disposition Plan: Status is: Observation  The patient remains OBS appropriate and will d/c before 2 midnights.  Dispo: The patient is from: Home              Anticipated d/c is to:  [facility]              Patient currently is not medically stable to d/c.   Difficult to place patient No  Level of care: MedSurg  The medical decision making on this patient was of high complexity and the patient is at high risk for clinical deterioration, therefore this is a level 3 visit.  Shela Leff MD [facility]  If 7PM-7AM, please contact night-coverage [email or web address]  10/15/2020, 6:08 AM

## 2020-10-15 NOTE — ED Notes (Signed)
Pt offers no complaints at this time. No s/sx of acute transfusion reaction. CBG stable at this time.

## 2020-10-15 NOTE — Discharge Summary (Signed)
Physician Discharge Summary  AVIYAH DESSO [id number] DOB: November 24, 1975 DOA: 10/15/2020  PCP: Heath Lark, MD  Admit date: 10/15/2020 Discharge date: 10/15/2020  Admitted From: home Discharge disposition: residential hospice      Discharge Diagnosis:   Principal Problem:   End of life care Active Problems:   Dyspnea   Severe sepsis ([place])   Severe anemia   Liver failure ([place])    Discharge Condition: terminal  Diet recommendation:  Regular.  Wound care: None.  Code status: DNR   History of Present Illness:      Dana Hicks is a 45 y.o. female with medical history significant of pancreatic cancer currently on palliative care with hospice, progressive liver failure, noninsulin-dependent type II diabetes, rheumatoid arthritis.  She was seen in the ED on 10/04/2020 and diagnosed with right lower extremity DVT for which she was started on Lovenox for anticoagulation.  Seen again in the ED on 8/20 for shortness of breath and CT angiogram revealed small segmental PE to the right lower lobe superior segment and posterior basilar segment.  Additional strand-like small PE in the right lower lobe pulmonary artery.  Given her hospice status, she was discharged from the ED with plan to follow-up with her oncologist.   Found to be anemia- given 1 unit PRBC  Here for comfort measures-- await placement in residential hospice.  C/o nausea-- not relieved with zofran or compazine.  Will use phenergan despite elevated bilirubin and if that does not work, can try ativan.            Discharge Exam:   Vitals:   10/15/20 0830 10/15/20 1100  BP: 115/68 119/63  Pulse: 89 93  Resp: 20 14  Temp: (!) 97.3 F (36.3 C)   SpO2: 100% 98%   Vitals:   10/15/20 0700 10/15/20 0715 10/15/20 0830 10/15/20 1100  BP: 118/72 117/77 115/68 119/63  Pulse: 91 92 89 93  Resp: (!) '21 20 20 14  '$ Temp:   (!) 97.3 F (36.3 C)   TempSrc:   Oral   SpO2: 100% 100% 100% 98%  Weight:       Height:        General exam: Appears calm and comfortable.    The results of significant diagnostics from this hospitalization (including imaging, microbiology, ancillary and laboratory) are listed below for reference.     Procedures and Diagnostic Studies:   DG Chest Port 1 View  Result Date: 10/15/2020 CLINICAL DATA:  Dyspnea EXAM: PORTABLE CHEST 1 VIEW COMPARISON:  None. FINDINGS: Lungs volumes are small, but are symmetric and are clear. No pneumothorax or pleural effusion. Cardiac size within normal limits. Right internal jugular chest port is unchanged with its tip within the right atrium. Pulmonary vascularity is normal. Osseous structures are age-appropriate. No acute bone abnormality. IMPRESSION: Pulmonary hypoinflation Electronically Signed   By: Fidela Salisbury M.D.   On: 10/15/2020 02:26     Labs:   Basic Metabolic Panel: Recent Labs  Lab 10/11/20 1109 10/15/20 0144  NA 134* 130*  K 3.7 4.5  CL 98 95*  CO2 26 9*  GLUCOSE 101* 32*  BUN 8 26*  CREATININE 0.40* 1.22*  CALCIUM 8.3* 7.8*   GFR Estimated Creatinine Clearance: 60.8 mL/min (A) (by C-G formula based on SCr of 1.22 mg/dL (H)). Liver Function Tests: Recent Labs  Lab 10/11/20 1109 10/15/20 0144  AST 154* 386*  ALT 86* 110*  ALKPHOS 459* 499*  BILITOT 15.7* 20.4*  PROT 5.5*  4.4*  ALBUMIN 2.0* 1.6*   No results for input(s): LIPASE, AMYLASE in the last 168 hours. No results for input(s): AMMONIA in the last 168 hours. Coagulation profile No results for input(s): INR, PROTIME in the last 168 hours.  CBC: Recent Labs  Lab 10/11/20 1109 10/15/20 0144  WBC 19.2* 36.6*  NEUTROABS 13.8* 27.8*  HGB 11.1* 5.4*  HCT 34.3* 18.0*  MCV 101.8* 113.2*  PLT 375 335   Cardiac Enzymes: No results for input(s): CKTOTAL, CKMB, CKMBINDEX, TROPONINI in the last 168 hours. BNP: Invalid input(s): POCBNP CBG: Recent Labs  Lab 10/15/20 0442 10/15/20 0523 10/15/20 0656 10/15/20 0856 10/15/20 1001   GLUCAP 128* 125* 144* 154* 156*   D-Dimer No results for input(s): DDIMER in the last 72 hours. Hgb A1c No results for input(s): HGBA1C in the last 72 hours. Lipid Profile No results for input(s): CHOL, HDL, LDLCALC, TRIG, CHOLHDL, LDLDIRECT in the last 72 hours. Thyroid function studies No results for input(s): TSH, T4TOTAL, T3FREE, THYROIDAB in the last 72 hours.  Invalid input(s): FREET3 Anemia work up No results for input(s): VITAMINB12, FOLATE, FERRITIN, TIBC, IRON, RETICCTPCT in the last 72 hours. Microbiology Recent Results (from the past 240 hour(s))  SARS CORONAVIRUS 2 (TAT 6-24 HRS) Nasopharyngeal Nasopharyngeal Swab     Status: None   Collection Time: 10/15/20  5:03 AM   Specimen: Nasopharyngeal Swab  Result Value Ref Range Status   SARS Coronavirus 2 NEGATIVE NEGATIVE Final    Comment: (NOTE) SARS-CoV-2 target nucleic acids are NOT DETECTED.  The SARS-CoV-2 RNA is generally detectable in upper and lower respiratory specimens during the acute phase of infection. Negative results do not preclude SARS-CoV-2 infection, do not rule out co-infections with other pathogens, and should not be used as the sole basis for treatment or other patient management decisions. Negative results must be combined with clinical observations, patient history, and epidemiological information. The expected result is Negative.  Fact Sheet for Patients: SugarRoll.be  Fact Sheet for Healthcare Providers: https://www.woods-mathews.com/  This test is not yet approved or cleared by the [place] FDA and  has been authorized for detection and/or diagnosis of SARS-CoV-2 by FDA under an Emergency Use Authorization (EUA). This EUA will remain  in effect (meaning this test can be used) for the duration of the COVID-19 declaration under Se ction 564(b)(1) of the Act, 21 U.S.C. section 360bbb-3(b)(1), unless the authorization is terminated or revoked  sooner.  Performed at [facility], [address], [place], [place] [address]      Discharge Instructions:   Discharge Instructions     Diet general   Complete by: As directed    Increase activity slowly   Complete by: As directed       Allergies as of 10/15/2020       Reactions   Codeine Other (See Comments)   headaches   Other    Other reaction(s): migraine headaches Other reaction(s): GI   Penicillins    Severe headaches - has tolerated amoxicillin Has patient had a PCN reaction causing immediate rash, facial/tongue/throat swelling, SOB or lightheadedness with hypotension: No Has patient had a PCN reaction causing severe rash involving mucus membranes or skin necrosis: No Has patient had a PCN reaction that required hospitalization: No Has patient had a PCN reaction occurring within the last 10 years: No If all of the above answers are "NO", then may proceed with Cephalosporin use.   Bactrim [sulfamethoxazole-trimethoprim] Rash        Medication List  STOP taking these medications    citalopram 20 MG tablet Commonly known as: CELEXA   diclofenac Sodium 1 % Gel Commonly known as: VOLTAREN   diphenhydrAMINE 25 MG tablet Commonly known as: BENADRYL   enoxaparin 100 MG/ML injection Commonly known as: LOVENOX   estradiol 0.1 MG/GM vaginal cream Commonly known as: ESTRACE VAGINAL   fluticasone 50 MCG/ACT nasal spray Commonly known as: FLONASE   HYDROmorphone 8 MG tablet Commonly known as: DILAUDID Replaced by: HYDROmorphone 2 MG/ML injection   hydrOXYzine 10 MG tablet Commonly known as: ATARAX/VISTARIL   lidocaine-prilocaine cream Commonly known as: EMLA   metFORMIN 500 MG tablet Commonly known as: GLUCOPHAGE   omeprazole 40 MG capsule Commonly known as: PRILOSEC   ondansetron 8 MG tablet Commonly known as: ZOFRAN   prochlorperazine 10 MG tablet Commonly known as: COMPAZINE   simethicone 125 MG chewable tablet Commonly known  as: MYLICON       TAKE these medications    HYDROmorphone 2 MG/ML injection Commonly known as: DILAUDID Inject 1 mL (2 mg total) into the vein every hour as needed for severe pain (or shortness of breath). Replaces: HYDROmorphone 8 MG tablet   LORazepam 2 MG/ML concentrated solution Commonly known as: ATIVAN Take 0.5 mLs (1 mg total) by mouth every 4 (four) hours as needed for anxiety (or nausea).   methadone 10 MG tablet Commonly known as: DOLOPHINE Take 1 tablet (10 mg total) by mouth every 12 (twelve) hours.   sodium chloride 0.9 % SOLN 50 mL with promethazine 25 MG/ML SOLN 12.5 mg Inject 12.5 mg into the vein every 6 (six) hours as needed.          Time coordinating discharge: 35 min  Signed:  Geradine Girt DO  [facility] 10/15/2020, 12:05 PM

## 2020-10-15 NOTE — ED Notes (Signed)
Pt transitioned to hospital bed  

## 2020-10-15 NOTE — ED Triage Notes (Signed)
Pt came from home via POV. C/c: SHOB x 2 days. Pt dx with PE x 2 days ago. Pt states she cannot catch her breath upon exertion. Pt utilized home O2 on 2L [place] without relief from shob. Pt O2 sat 99% on RA. Pt also complains of 10/10 in right leg due to DVT that was dx recently as well. Pt is on hospice care and DNR x 3 weeks

## 2020-10-15 NOTE — Progress Notes (Signed)
Chaplain responded to consult input.  Chaplain spoke with nurse, and Alyse Low revealed she had her own spiritual care/clergy coming in today and did not need Chaplain right now.  Chaplain is available to follow-up as needed.    10/15/20 0800  Clinical Encounter Type  Visited With Health care provider  Visit Type Follow-up;Initial

## 2020-10-15 NOTE — Telephone Encounter (Signed)
Vivien Rota from [facility] reached out to let us know that Dana Hicks is not doing well.  She had increased pain and was admitted to the emergency department last night at [facility].  Her kidneys are shutting down and she is septic.  They do not expect her to get out of the hospital but they do have a bed for her at [facility] and they will try to move her today if she is stable enough.  Gardiner Rhyme, RN

## 2020-10-15 NOTE — Progress Notes (Signed)
Manufacturing engineer [facility]) Hospital Liaison note.     This patient is approved to transfer to [facility] today.   Please arrange transport with [facility] as this is our active hospice patient.   RN please call report to [facility] @ [phone].   Thank you,     Farrel Gordon, RN, [place] [facility] Liaison  [phone]

## 2020-10-15 NOTE — ED Notes (Signed)
Dana Hicks, [place] bedside

## 2020-10-15 NOTE — ED Notes (Signed)
Pt c/o nausea after drinking water.

## 2020-10-15 NOTE — ED Provider Notes (Addendum)
[facility] DEPT Provider Note   CSN: [id number] Arrival date & time: 10/15/20  0105     History Chief Complaint  Patient presents with   Shortness of Breath    Dana Hicks is a 45 y.o. female.  45 year old female with history of malignant neoplasm of pancreatic tail, liver failure without hepatic coma, pulmonary embolus and DVT (on Lovenox), RA, chronic oncologic pain since to the emergency department for dyspnea on exertion.  She reports that she becomes profoundly short of breath and lightheaded with even minimal degrees of exertion.  Was recently diagnosed with a right lower extremity DVT and pulmonary embolus.  She has been experiencing constant, ongoing pain mostly in her right lower extremity where her blood clot was discovered.  Pain is unrelieved despite her chronic prescribed Dilaudid 25m PO q 4 hrs.  No fevers or syncope.  PATIENT IS DNR. Followed by Hospice.  The history is provided by the patient. No language interpreter was used.  Shortness of Breath     Past Medical History:  Diagnosis Date   Allergic rhinitis, seasonal    Cecal cancer ([facility] 2001   Stage II (T3N0)  04-11-2001cecum-partial colectomy and completed chemo 2002   Depression    Diabetes mellitus without complication ([facility]    Endometrial cancer ([facility] 08/2017   Stage IA, Grade 1   GERD (gastroesophageal reflux disease)    History of MRSA infection 01/2017   followed by infectious disease center--  recurrent pustular folliculitis   Nephrolithiasis    bilateral nonobstructive calculi per CT 08-18-2017   OA (osteoarthritis)    Ovarian cancer, right ([facility] [facility] 08/2017   Stage II Grade 2 Endometrioid   Pancreatic cancer ([facility]    Rheumatoid arthritis ([facility]    rheumatologist-  dr devenswar-- treated w/ oral prednisone daily and methotrexate injection every 3 wks    Patient Active Problem List   Diagnosis Date Noted   Right leg DVT ([facility] [place] 10/06/2020   Liver failure without  hepatic coma ([facility] 09/18/2020   Other fatigue 06/24/2020   Acute maxillary sinusitis 06/24/2020   Constipation 06/24/2020   GERD (gastroesophageal reflux disease) 06/24/2020   Hyperglycemia 06/24/2020   Left upper quadrant pain 06/24/2020   Mixed anxiety and depressive disorder 06/24/2020   Transfusion history 06/24/2020   Dyspnea on exertion 06/03/2020   Oral thrush 05/16/2020   Acquired hypogammaglobulinemia ([facility] [place] [facility] [place] 02/26/2020   Scalp itch 02/26/2020   Fever 01/28/2020   Mucositis due to chemotherapy 01/25/2020   Chronic midline thoracic back pain 07/09/2019   Cancer associated pain 07/02/2019   Goals of care, counseling/discussion 06/11/2019   Weight loss 05/29/2019   Abdominal pain 04/11/2019   Pancreatitis, acute 04/09/2019   Vaginal dryness, menopausal 01/23/2019   S/P left unicompartmental knee replacement 11/27/2018   UTI (urinary tract infection) 06/29/2018   Pulmonary embolism ([facility] 04/10/2018   Diarrhea due to drug 03/28/2018   Steroid-induced diabetes ([facility] [facility] 03/14/2018   Neutropenic fever ([facility] 02/14/2018   Acute rhinitis 02/14/2018   Tachycardia 02/14/2018   Elevated liver enzymes 02/14/2018   Physical debility 02/07/2018   Chemotherapy-induced nausea 02/07/2018   Hot flashes due to surgical menopause 02/06/2018   Anemia due to antineoplastic chemotherapy 12/15/2017   Hypomagnesemia 12/15/2017   Iron deficiency anemia 12/15/2017   S/P splenectomy 12/15/2017   Family history of colon cancer 12/04/2017   Lynch syndrome 11/22/2017   Bilateral chronic knee pain 11/21/2017   Genetic testing 11/18/2017   Calculus of gallbladder without cholecystitis without obstruction 11/10/2017  Inflammatory arthritis 11/08/2017   Dysuria 11/02/2017   Peripheral neuropathy due to chemotherapy ([place]) 10/22/2017   Bone pain 10/21/2017   Malignant neoplasm of tail of pancreas ([place]) 10/17/2017   Family history of uterine cancer 10/17/2017   Sinusitis, chronic 09/15/2017    Malabsorption due to disorder of pancreas 09/15/2017   Primary malignant neoplasm of endometrium ([place]) 08/31/2017   Secondary malignant neoplasm of right ovary ([place]) 40/09/6759   Pustular folliculitis 95/10/3265   MRSA colonization 05/12/2017   Dacrocystitis, left 05/09/2017   Perennial allergic rhinitis 05/09/2017   Seasonal allergies 03/29/2017   Left foot pain 01/10/2017   Osteoarthritis of both knees 12/01/2016   Class 3 severe obesity due to excess calories without serious comorbidity with body mass index (BMI) of 40.0 to 44.9 in adult ([place]) 10/01/2016   High risk medication use 02/06/2016   Family history of rheumatoid arthritis 02/06/2016   H/O seasonal allergies 02/06/2016   Rheumatoid arthritis of multiple sites with negative rheumatoid factor ([place]) 02/03/2016   History of malignant neoplasm of colon 02/03/2016   HLA B27 (HLA B27 positive) 02/03/2016   Malignant neoplasm of colon, unspecified ([place]) 02/23/1999    Past Surgical History:  Procedure Laterality Date   BREAST LUMPECTOMY WITH RADIOACTIVE SEED LOCALIZATION Left 06/28/2014   Benign Procedure: RADIOACTIVE SEED LOCALIZATION LEFT BREAST LUMPECTOMY;  Surgeon: Excell Seltzer, MD;  Location: [facility];  Service: General;  Laterality: Left;   COLONOSCOPY  06/19/14   ESOPHAGOGASTRODUODENOSCOPY (EGD) WITH PROPOFOL N/A 06/07/2019   Procedure: ESOPHAGOGASTRODUODENOSCOPY (EGD) WITH PROPOFOL;  Surgeon: [name], MD;  [facility];  Service: Endoscopy;  Laterality: N/A;   EUS N/A 06/07/2019   Procedure: UPPER ENDOSCOPIC ULTRASOUND (EUS) LINEAR;  Surgeon: [name], MD;  [facility];  Service: Endoscopy;  Laterality: N/A;   EYE SURGERY Left    plug in tear duct   FINE NEEDLE ASPIRATION N/A 06/07/2019   Procedure: FINE NEEDLE ASPIRATION (FNA) LINEAR;  Surgeon: [name], MD;  [facility];  Service: Endoscopy;  Laterality: N/A;   IR IMAGING GUIDED PORT INSERTION   09/26/2017   KNEE ARTHROSCOPY     PANCREATECTOMY  11/25/2017   PARTIAL KNEE ARTHROPLASTY Left 11/27/2018   Procedure: UNICOMPARTMENTAL KNEE;  Surgeon: Vickey Huger, MD;  Location: [facility];  Service: Orthopedics;  Laterality: Left;   PORT A CATH REVISION  2001   in and out   RIGHT COLECTOMY  06/02/2009   cecum cancer   ROBOTIC ASSISTED TOTAL HYSTERECTOMY WITH BILATERAL SALPINGO OOPHERECTOMY Bilateral 08/30/2017   Procedure: XI ROBOTIC ASSISTED TOTAL  HYSTERECTOMY BILATERAL  SALPINGO OOPHORECTOMY; LYSIS OF ADHESIONS;  Surgeon: Isabel Caprice, MD;  Location: [facility];  Service: Gynecology;  Laterality: Bilateral;   SPLENECTOMY, PARTIAL  11/25/2017     OB History   No obstetric history on file.     Family History  Problem Relation Age of Onset   Arthritis/Rheumatoid Mother    Uterine cancer Mother 58   Allergic rhinitis Father    Heart disease Father    Drug abuse Son    Other Maternal Aunt        Unknown GYN CA   Colon cancer Maternal Uncle 68   Rectal cancer Maternal Uncle    Diabetes Maternal Grandmother    Cancer Maternal Grandfather        d. 42s of liver/lung cancer   Angioedema Neg Hx    Asthma Neg Hx    Atopy Neg Hx  Eczema Neg Hx    Immunodeficiency Neg Hx    Urticaria Neg Hx    Esophageal cancer Neg Hx    Stomach cancer Neg Hx     Social History   Tobacco Use   Smoking status: Former    Packs/day: 1.00    Years: 5.00    Pack years: 5.00    Types: Cigarettes    Quit date: 02/05/1998    Years since quitting: 22.7   Smokeless tobacco: Never  Vaping Use   Vaping Use: Never used  Substance Use Topics   Alcohol use: Not Currently   Drug use: No    Home Medications Prior to Admission medications   Medication Sig Start Date End Date Taking? Authorizing Provider  citalopram (CELEXA) 20 MG tablet TAKE 1 TABLET BY MOUTH EVERY DAY Patient taking differently: Take 20 mg by mouth daily. 08/19/20   Heath Lark, MD  diclofenac Sodium (VOLTAREN) 1 % GEL APPLY 2-4  GRAMS TO AFFECTED JOINT 4 TIMES DAILY AS NEEDED. Patient taking differently: Apply 2 g topically 4 (four) times daily as needed (pain). 09/03/19   Bo Merino, MD  diphenhydrAMINE (BENADRYL) 25 MG tablet Take 25 mg by mouth every 6 (six) hours as needed for itching.    [provider]  enoxaparin (LOVENOX) 100 MG/ML injection Inject 0.9 mLs (90 mg total) into the skin every 12 (twelve) hours. 10/04/20 11/03/20  Arnaldo Natal, MD  estradiol (ESTRACE VAGINAL) 0.1 MG/GM vaginal cream Place 1 Applicatorful vaginally 3 (three) times a week. Patient taking differently: Place 1 Applicatorful vaginally 3 (three) times a week. Monday Wednesday friday 02/27/20   Heath Lark, MD  fluticasone (FLONASE) 50 MCG/ACT nasal spray Place 1 spray into both nostrils daily.    [provider]  HYDROmorphone (DILAUDID) 8 MG tablet Take 1 tablet (8 mg total) by mouth every 4 (four) hours as needed. Patient taking differently: Take 8 mg by mouth every 4 (four) hours as needed for moderate pain. 09/17/20   Heath Lark, MD  hydrOXYzine (ATARAX/VISTARIL) 10 MG tablet Take 10 mg by mouth in the morning and at bedtime.    [provider]  lidocaine-prilocaine (EMLA) cream Apply 1 application topically daily as needed (port). Apply to affected area once 12/07/18   Heath Lark, MD  metFORMIN (GLUCOPHAGE) 500 MG tablet Take 2 tablets (1,000 mg total) by mouth 2 (two) times daily with a meal. 07/08/20   Heath Lark, MD  methadone (DOLOPHINE) 10 MG tablet Take 1 tablet (10 mg total) by mouth every 12 (twelve) hours. 10/13/20   Heath Lark, MD  omeprazole (PRILOSEC) 40 MG capsule TAKE 1 CAPSULE BY MOUTH EVERY DAY Patient taking differently: Take 40 mg by mouth daily. 03/07/20   Heath Lark, MD  ondansetron (ZOFRAN) 8 MG tablet TAKE 1 TAB BY MOUTH EVERY 8HOURS AS NEEDED FOR REFRACTORY NAUSEA/VOMITING START ON DAY 3AFTER CHEMO Patient taking differently: Take 8 mg by mouth every 8 (eight) hours as needed for  nausea. 09/08/20   Heath Lark, MD  prochlorperazine (COMPAZINE) 10 MG tablet TAKE 1 TABLET BY MOUTH EVERY 6 HOURS AS NEEDED Patient taking differently: Take 10 mg by mouth every 6 (six) hours as needed. 01/15/20   Heath Lark, MD  simethicone (MYLICON) 440 MG chewable tablet Chew 125 mg by mouth every 6 (six) hours as needed for flatulence.    [provider]    Allergies    Codeine, Other, Penicillins, and Bactrim [sulfamethoxazole-trimethoprim]  Review of Systems   Review of  Systems  Respiratory:  Positive for shortness of breath.   Ten systems reviewed and are negative for acute change, except as noted in the HPI.    Physical Exam Updated Vital Signs BP 103/73   Pulse 96   Temp 97.6 F (36.4 C) (Oral)   Resp 18   Ht '5\' 2"'  (1.575 m)   Wt 93.9 kg   LMP 08/30/2017   SpO2 100%   BMI 37.86 kg/m   Physical Exam Vitals and nursing note reviewed.  Constitutional:      General: She is not in acute distress.    Appearance: She is not diaphoretic.     Comments: Chronically ill appearing. Profoundly jaundiced.  HENT:     Head: Normocephalic and atraumatic.  Eyes:     General: Scleral icterus present.  Cardiovascular:     Rate and Rhythm: Regular rhythm. Tachycardia present.     Pulses: Normal pulses.  Pulmonary:     Effort: Pulmonary effort is normal. No respiratory distress.     Comments: Appears dyspneic.  She is tachypneic.  Oxygen saturations maintaining at 98% on room air. Abdominal:     Comments: Soft, obese abdomen  Musculoskeletal:        General: Normal range of motion.     Cervical back: Normal range of motion.     Comments: RLE compartments are soft, compressible.   Skin:    General: Skin is warm and dry.     Coloration: Skin is jaundiced. Skin is not pale.     Findings: Bruising (posteromedial RLE as well as L lower abdominal wall) present.  Neurological:     Mental Status: She is alert and oriented to person, place, and time.  Psychiatric:         Behavior: Behavior normal.    ED Results / Procedures / Treatments   Labs (all labs ordered are listed, but only abnormal results are displayed) Labs Reviewed  CBC WITH DIFFERENTIAL/PLATELET - Abnormal; Notable for the following components:      Result Value   WBC 36.6 (*)    RBC 1.59 (*)    Hemoglobin 5.4 (*)    HCT 18.0 (*)    MCV 113.2 (*)    RDW 17.2 (*)    nRBC 1.2 (*)    Neutro Abs 27.8 (*)    Monocytes Absolute 1.8 (*)    nRBC 1 (*)    Abs Immature Granulocytes 3.70 (*)    All other components within normal limits  COMPREHENSIVE METABOLIC PANEL - Abnormal; Notable for the following components:   Sodium 130 (*)    Chloride 95 (*)    CO2 9 (*)    Glucose, Bld 32 (*)    BUN 26 (*)    Creatinine, Ser 1.22 (*)    Calcium 7.8 (*)    Total Protein 4.4 (*)    Albumin 1.6 (*)    AST 386 (*)    ALT 110 (*)    Alkaline Phosphatase 499 (*)    Total Bilirubin 20.4 (*)    GFR, Estimated 55 (*)    Anion gap 26 (*)    All other components within normal limits  CBG MONITORING, ED - Abnormal; Notable for the following components:   Glucose-Capillary 148 (*)    All other components within normal limits  CBG MONITORING, ED - Abnormal; Notable for the following components:   Glucose-Capillary 128 (*)    All other components within normal limits  TROPONIN I (HIGH SENSITIVITY) - Abnormal;  Notable for the following components:   Troponin I (High Sensitivity) 47 (*)    All other components within normal limits  SARS CORONAVIRUS 2 (TAT 6-24 HRS)  CBG MONITORING, ED  TYPE AND SCREEN  PREPARE RBC (CROSSMATCH)    EKG EKG Interpretation  Date/Time:  Wednesday October 15 2020 01:29:46 EDT Ventricular Rate:  110 PR Interval:  125 QRS Duration: 78 QT Interval:  337 QTC Calculation: 456 R Axis:   33 Text Interpretation: Sinus tachycardia Confirmed by Gerlene Fee [phone]) on 10/15/2020 2:52:52 AM  Radiology DG Chest Port 1 View  Result Date: 10/15/2020 CLINICAL DATA:  Dyspnea  EXAM: PORTABLE CHEST 1 VIEW COMPARISON:  None. FINDINGS: Lungs volumes are small, but are symmetric and are clear. No pneumothorax or pleural effusion. Cardiac size within normal limits. Right internal jugular chest port is unchanged with its tip within the right atrium. Pulmonary vascularity is normal. Osseous structures are age-appropriate. No acute bone abnormality. IMPRESSION: Pulmonary hypoinflation Electronically Signed   By: Fidela Salisbury M.D.   On: 10/15/2020 02:26    Procedures .Critical Care  Date/Time: 10/15/2020 3:56 AM Performed by: Antonietta Breach, PA-C Authorized by: Antonietta Breach, PA-C   Critical care provider statement:    Critical care time (minutes):  49   Critical care was necessary to treat or prevent imminent or life-threatening deterioration of the following conditions:  Renal failure, circulatory failure, hepatic failure and metabolic crisis (multi organ failure)   Critical care was time spent personally by me on the following activities:  Discussions with consultants, evaluation of patient's response to treatment, examination of patient, ordering and performing treatments and interventions, ordering and review of laboratory studies, ordering and review of radiographic studies, pulse oximetry, re-evaluation of patient's condition, obtaining history from patient or surrogate and review of old charts   Medications Ordered in ED Medications  dextrose 10 % infusion ( Intravenous New Bag/Given 10/15/20 0307)  0.9 %  sodium chloride infusion (has no administration in time range)  dextrose 50 % solution 50 mL (has no administration in time range)  0.9 %  sodium chloride infusion ( Intravenous New Bag/Given 10/15/20 0203)  HYDROmorphone (DILAUDID) injection 1 mg (1 mg Intravenous Given 10/15/20 0203)  HYDROmorphone (DILAUDID) injection 2 mg (2 mg Intravenous Given 10/15/20 0216)  dextrose 50 % solution 50 mL (50 mLs Intravenous Given 10/15/20 0307)  HYDROmorphone (DILAUDID) injection 1  mg (1 mg Intravenous Given 10/15/20 0356)  ondansetron (ZOFRAN) injection 4 mg (4 mg Intravenous Given 10/15/20 0417)    ED Course  I have reviewed the triage vital signs and the nursing notes.  Pertinent labs & imaging results that were available during my care of the patient were reviewed by me and considered in my medical decision making (see chart for details).  Clinical Course as of 10/15/20 0628  Wed Oct 15, 2020  0259 Hemoglobin resulted at 5.4.  This is a 6 g drop compared to 4 days ago.  Glucose also 32.  Will give amp of D50 and started on D10 infusion. [KH]  0308 Awaiting call back from hospice RN. Labs suggestive of multi organ failure. [OY]  7741 Had a lengthy conversation with patient and family at bedside regarding the patient's laboratory abnormalities.  I expressed my concern that these results are suggestive that she is nearing the end of her life.  I spoke with family about which measures they would like to proceed with to support patient at this time.  Patient and family express desire to proceed  with blood transfusion while they weigh option of strict comfort care. I feel this is reasonable. [KH]  9507 Spoke with Stormy Card, on call hospice RN. Discussed patient's clinical outlook and acute laboratory changes. Rusty to attempt to call [facility] to inquire about bed availability. [KH]  0351 No bed available at [facility] until 8 AM tomorrow.  There was a conversation with the family at bedside, which included myself and hospice RN Sherri via phone, about patient and family's comfort with discharge versus admission. The family and patient wish to be admitted to the hospital. Husband will reach out to family regarding patient's outlook later this morning. [KH]  0358 Zofran added for nausea [KH]  0358 Confirmed with husband the patient's wishes on interventions. Aside from glucose ordered to prevent hypoglycemia and 1 unit of ordered PRBCs, will proceed with no further interventions  and opt to maximize comfort care measures. Dr. Marlowe Sax updated. [KH]    Clinical Course User Index [KH] Beverely Pace   MDM Rules/Calculators/A&P                           45 year old female to be admitted to the hospital for supportive measures, comfort care.  History of pancreatic cancer, recently diagnosed with DVT/PE.  Presented tonight for worsening shortness of breath which is likely related to her acute anemia rather than her known pulmonary embolus.  Hemoglobin today 5.4.  This is being tempered with a solitary unit of PRBCs to try and help alleviate patient's tachypnea, dyspnea.  She was placed on O2 for comfort, but has otherwise never been hypoxic.  Plan to have patient remain on D10 infusion with PRN amps of D50 for acute hypoglycemia. Aside from these aforementioned measures and use of IV narcotics for pain control, patient and family on board with plan for comfort care. No additional interventions to be pursued. She will be admitted to the hospitalist service.   A call was made to patient's hospice team. Should she live beyond the next 24 hours, hospice to attempt transfer to [facility]. Patient DNR.   Final Clinical Impression(s) / ED Diagnoses Final diagnoses:  Symptomatic anemia  Malignant neoplasm of tail of pancreas ([place])  Liver failure without hepatic coma, unspecified chronicity ([place])  Hypoglycemia    Rx / DC Orders ED Discharge Orders     None        Antonietta Breach, PA-C 10/15/20 2257    Maudie Flakes, MD 10/15/20 0530    Antonietta Breach, PA-C 10/15/20 5051    Maudie Flakes, MD 10/15/20 [phone]

## 2020-10-15 NOTE — Telephone Encounter (Signed)
Thanks for the update

## 2020-10-15 NOTE — ED Notes (Signed)
Admitting MD at bedside.

## 2020-10-15 NOTE — Progress Notes (Signed)
Patient placed in observation after midnight.  Please see H&P.  Here for comfort measures-- await placement in residential hospice.  C/o nausea-- not relieved with zofran or compazine.  Will use phenergan despite elevated bilirubin and if that does not work, can try ativan.  Eulogio Bear DO

## 2020-10-15 NOTE — ED Notes (Signed)
A more comfortable hospital bed is being located for patient due to the uncomfortable ED stretcher.

## 2020-10-16 DIAGNOSIS — E8809 Other disorders of plasma-protein metabolism, not elsewhere classified: Secondary | ICD-10-CM | POA: Diagnosis not present

## 2020-10-16 DIAGNOSIS — C569 Malignant neoplasm of unspecified ovary: Secondary | ICD-10-CM | POA: Diagnosis not present

## 2020-10-16 DIAGNOSIS — R110 Nausea: Secondary | ICD-10-CM | POA: Diagnosis not present

## 2020-10-16 DIAGNOSIS — Z1509 Genetic susceptibility to other malignant neoplasm: Secondary | ICD-10-CM | POA: Diagnosis not present

## 2020-10-16 DIAGNOSIS — C252 Malignant neoplasm of tail of pancreas: Secondary | ICD-10-CM | POA: Diagnosis not present

## 2020-10-16 DIAGNOSIS — R634 Abnormal weight loss: Secondary | ICD-10-CM | POA: Diagnosis not present

## 2020-10-16 DIAGNOSIS — C541 Malignant neoplasm of endometrium: Secondary | ICD-10-CM | POA: Diagnosis not present

## 2020-10-16 DIAGNOSIS — Z9221 Personal history of antineoplastic chemotherapy: Secondary | ICD-10-CM | POA: Diagnosis not present

## 2020-10-16 DIAGNOSIS — R630 Anorexia: Secondary | ICD-10-CM | POA: Diagnosis not present

## 2020-10-16 DIAGNOSIS — R17 Unspecified jaundice: Secondary | ICD-10-CM | POA: Diagnosis not present

## 2020-10-16 DIAGNOSIS — G620 Drug-induced polyneuropathy: Secondary | ICD-10-CM | POA: Diagnosis not present

## 2020-10-16 DIAGNOSIS — C787 Secondary malignant neoplasm of liver and intrahepatic bile duct: Secondary | ICD-10-CM | POA: Diagnosis not present

## 2020-10-16 DIAGNOSIS — Z87442 Personal history of urinary calculi: Secondary | ICD-10-CM | POA: Diagnosis not present

## 2020-10-16 DIAGNOSIS — C189 Malignant neoplasm of colon, unspecified: Secondary | ICD-10-CM | POA: Diagnosis not present

## 2020-10-16 DIAGNOSIS — Z8619 Personal history of other infectious and parasitic diseases: Secondary | ICD-10-CM | POA: Diagnosis not present

## 2020-10-16 LAB — BPAM RBC
Blood Product Expiration Date: 202209172359
ISSUE DATE / TIME: 202208240448
Unit Type and Rh: 6200

## 2020-10-16 LAB — TYPE AND SCREEN
ABO/RH(D): A POS
Antibody Screen: NEGATIVE
Unit division: 0

## 2020-10-23 NOTE — Consult Note (Signed)
Consultation Note Date: 10/06/2020   Patient Name: Dana Hicks  DOB: 26-Feb-1975  MRN: [id number]  Age / Sex: 45 y.o., female  PCP: Heath Lark, MD Referring Physician: No att. providers found  Reason for Consultation: Pain control and Terminal Care  HPI/Patient Profile: 45 y.o. female  with past medical history of Pancreatic cancer currently on hospice, progressive liver failure, non-insulin-dependent diabetes, rheumatoid arthritis who presented with uncontrolled pain related to metastatic cancer.  Palliative consulted for assistance with pain management..   Clinical Assessment and Goals of Care: Palliative care consult received.  Chart reviewed including pressure review of pertinent labs and imaging.  I reviewed patient's medical history including PDMP and MAR.  I saw and examined Ms. Keener in the ED.  Family, including her husband and children, were present at the bedside.  We discussed her clinical course at home and pain that brought her into the emergency room.  She reports that she has been having uncontrolled pain that does appear to be doing better since receiving 4 mg total of IV Dilaudid earlier this morning.  She reports that this did well to control her pain but is starting to recur.  We reviewed her pain regimen at home including methadone 10 mg at night and breakthrough Dilaudid 8 mg by mouth as needed.  Discussed options for pain management including initiation of as needed Dilaudid pushes versus consideration for PCA.  We discussed that decisions regarding pain management overnight may depend upon availability of [place] bed.  Plan is to transition her to [facility] when this can be arranged.  SUMMARY OF RECOMMENDATIONS   -DNR/DNI -Full comfort -Pain/shortness of breath: I made addition of Dilaudid 2 mg every hour as needed for breakthrough pain.  If she is requiring frequent doses,  I would have low threshold to place her on a PCA, particularly if she is not going to transition to [employer] today. -Anxiety: Ativan as needed -Agitation: Haldol as needed -Secretions: Robinul as needed  Code Status/Advance Care Planning: DNR   Symptom Management:  As above  Palliative Prophylaxis:  Frequent Pain Assessment  Additional Recommendations (Limitations, Scope, Preferences): Full Comfort Care  Psycho-social/Spiritual:  Desire for further Chaplaincy support:yes Additional Recommendations: Education on Hospice  Prognosis:  Hours - Days  Discharge Planning: Hospice facility      Primary Diagnoses: Present on Admission: **None**   I have reviewed the medical record, interviewed the patient and family, and examined the patient. The following aspects are pertinent.  Past Medical History:  Diagnosis Date   Allergic rhinitis, seasonal    Cecal cancer ([place]) 2001   Stage II (T3N0)  04-11-2001cecum-partial colectomy and completed chemo 2002   Depression    Diabetes mellitus without complication ([place])    Endometrial cancer ([place]) 08/2017   Stage IA, Grade 1   GERD (gastroesophageal reflux disease)    History of MRSA infection 01/2017   followed by infectious disease center--  recurrent pustular folliculitis   Nephrolithiasis    bilateral nonobstructive calculi per CT 08-18-2017  OA (osteoarthritis)    Ovarian cancer, right ([place]) 08/2017   Stage II Grade 2 Endometrioid   Pancreatic cancer ([place])    Rheumatoid arthritis [facility])    rheumatologist-  dr devenswar-- treated w/ oral prednisone daily and methotrexate injection every 3 wks   Social History   Socioeconomic History   Marital status: Married    Spouse name: Quita Skye   Number of children: 2   Years of education: Not on file   Highest education level: Not on file  Occupational History   Occupation: Futures trader  Tobacco Use   Smoking status: Former    Packs/day: 1.00    Years: 5.00    Pack  years: 5.00    Types: Cigarettes    Quit date: 02/05/1998    Years since quitting: 22.7   Smokeless tobacco: Never  Vaping Use   Vaping Use: Never used  Substance and Sexual Activity   Alcohol use: Not Currently   Drug use: No   Sexual activity: Not Currently  Other Topics Concern   Not on file  Social History Narrative   Not on file   Social Determinants of Health   Financial Resource Strain: Not on file  Food Insecurity: Not on file  Transportation Needs: Not on file  Physical Activity: Not on file  Stress: Not on file  Social Connections: Not on file   Family History  Problem Relation Age of Onset   Arthritis/Rheumatoid Mother    Uterine cancer Mother 46   Allergic rhinitis Father    Heart disease Father    Drug abuse Son    Other Maternal Aunt        Unknown GYN CA   Colon cancer Maternal Uncle 68   Rectal cancer Maternal Uncle    Diabetes Maternal Grandmother    Cancer Maternal Grandfather        d. 42s of liver/lung cancer   Angioedema Neg Hx    Asthma Neg Hx    Atopy Neg Hx    Eczema Neg Hx    Immunodeficiency Neg Hx    Urticaria Neg Hx    Esophageal cancer Neg Hx    Stomach cancer Neg Hx    Scheduled Meds: Continuous Infusions: PRN Meds:. Medications Prior to Admission:  Prior to Admission medications   Medication Sig Start Date End Date Taking? Authorizing Provider  methadone (DOLOPHINE) 10 MG tablet Take 1 tablet (10 mg total) by mouth every 12 (twelve) hours. 10/13/20  Yes Gorsuch, Ni, MD  HYDROmorphone (DILAUDID) 2 MG/ML injection Inject 1 mL (2 mg total) into the vein every hour as needed for severe pain (or shortness of breath). 10/15/20   Geradine Girt, DO  LORazepam (ATIVAN) 2 MG/ML concentrated solution Take 0.5 mLs (1 mg total) by mouth every 4 (four) hours as needed for anxiety (or nausea). 10/15/20   Geradine Girt, DO  sodium chloride 0.9 % SOLN 50 mL with promethazine 25 MG/ML SOLN 12.5 mg Inject 12.5 mg into the vein every 6 (six) hours  as needed. 10/15/20   Geradine Girt, DO   Allergies  Allergen Reactions   Codeine Other (See Comments)    headaches   Other     Other reaction(s): migraine headaches Other reaction(s): GI   Penicillins     Severe headaches - has tolerated amoxicillin Has patient had a PCN reaction causing immediate rash, facial/tongue/throat swelling, SOB or lightheadedness with hypotension: No Has patient had a PCN reaction causing severe rash involving mucus membranes  or skin necrosis: No Has patient had a PCN reaction that required hospitalization: No Has patient had a PCN reaction occurring within the last 10 years: No If all of the above answers are "NO", then may proceed with Cephalosporin use.    Bactrim [Sulfamethoxazole-Trimethoprim] Rash    Physical Exam General: Alert, awake, in no acute distress.  Chronically ill-appearingHEENT: No bruits, no goiter, no JVD Heart: Regular rate and rhythm. No murmur appreciated. Lungs: Good air movement, clear Abdomen: Soft, nontender, nondistended, positive bowel sounds.   Ext: No significant edema Skin: Warm and dry Neuro: Grossly intact, nonfocal.  Vital Signs: BP (!) 111/51   Pulse 91   Temp (!) 97.3 F (36.3 C) (Oral)   Resp 17   Ht '5\' 2"'$  (1.575 m)   Wt 93.9 kg   LMP 08/30/2017   SpO2 100%   BMI 37.86 kg/m  Pain Scale: 0-10   Pain Score: 2    SpO2: SpO2: 100 % O2 Device:SpO2: 100 % O2 Flow Rate: .   IO: Intake/output summary: No intake or output data in the 24 hours ending 09/30/2020 2249  LBM:   Baseline Weight: Weight: 93.9 kg Most recent weight: Weight: 93.9 kg     Palliative Assessment/Data:   Flowsheet Rows    Flowsheet Row Most Recent Value  Intake Tab   Referral Department Hospitalist  Unit at Time of Referral ER  Palliative Care Primary Diagnosis Cancer  Date Notified 10/15/20  Palliative Care Type New Palliative care  Reason for referral Pain  Date of Admission 10/15/20  Date first seen by Palliative Care  10/15/20  # of days Palliative referral response time 0 Day(s)  # of days IP prior to Palliative referral 0  Clinical Assessment   Palliative Performance Scale Score 20%  Psychosocial & Spiritual Assessment   Palliative Care Outcomes   Patient/Family meeting held? Yes  Who was at the meeting? Patient, husband, children       Time In: 49 Time Out: 1200 Time Total: 87 Greater than 50%  of this time was spent counseling and coordinating care related to the above assessment and plan.  Signed by: Micheline Rough, MD   Please contact Palliative Medicine Team phone at [phone] for questions and concerns.  For individual provider: See Shea Evans

## 2020-10-23 DEATH — deceased

## 2020-12-03 ENCOUNTER — Ambulatory Visit: Payer: BC Managed Care – PPO | Admitting: Physician Assistant

## 2022-05-28 ENCOUNTER — Other Ambulatory Visit: Payer: Self-pay

## 2023-01-12 NOTE — Telephone Encounter (Signed)
Telephone call
